# Patient Record
Sex: Female | Born: 1940 | Race: White | Hispanic: No | Marital: Married | Smoking: Current every day smoker
Health system: Southern US, Community
[De-identification: ages and names within clinical notes are randomized; demographics above are authoritative.]

## PROBLEM LIST (undated history)

## (undated) DIAGNOSIS — I219 Acute myocardial infarction, unspecified: Secondary | ICD-10-CM

## (undated) DIAGNOSIS — Z950 Presence of cardiac pacemaker: Secondary | ICD-10-CM

## (undated) DIAGNOSIS — F32A Depression, unspecified: Secondary | ICD-10-CM

## (undated) DIAGNOSIS — F329 Major depressive disorder, single episode, unspecified: Secondary | ICD-10-CM

## (undated) DIAGNOSIS — I10 Essential (primary) hypertension: Secondary | ICD-10-CM

## (undated) DIAGNOSIS — E119 Type 2 diabetes mellitus without complications: Secondary | ICD-10-CM

## (undated) DIAGNOSIS — F419 Anxiety disorder, unspecified: Secondary | ICD-10-CM

## (undated) DIAGNOSIS — N189 Chronic kidney disease, unspecified: Secondary | ICD-10-CM

## (undated) DIAGNOSIS — E079 Disorder of thyroid, unspecified: Secondary | ICD-10-CM

## (undated) DIAGNOSIS — R9431 Abnormal electrocardiogram [ECG] [EKG]: Secondary | ICD-10-CM

## (undated) DIAGNOSIS — I2510 Atherosclerotic heart disease of native coronary artery without angina pectoris: Secondary | ICD-10-CM

## (undated) DIAGNOSIS — I5042 Chronic combined systolic (congestive) and diastolic (congestive) heart failure: Secondary | ICD-10-CM

## (undated) DIAGNOSIS — C969 Malignant neoplasm of lymphoid, hematopoietic and related tissue, unspecified: Secondary | ICD-10-CM

## (undated) HISTORY — PX: CHOLECYSTECTOMY: SHX55

## (undated) HISTORY — PX: PACEMAKER IMPLANT: EP1218

## (undated) HISTORY — DX: Depression, unspecified: F32.A

## (undated) HISTORY — PX: EYE SURGERY: SHX253

## (undated) HISTORY — DX: Major depressive disorder, single episode, unspecified: F32.9

## (undated) HISTORY — DX: Type 2 diabetes mellitus without complications: E11.9

## (undated) HISTORY — PX: OTHER SURGICAL HISTORY: SHX169

## (undated) HISTORY — DX: Anxiety disorder, unspecified: F41.9

## (undated) HISTORY — DX: Disorder of thyroid, unspecified: E07.9

## (undated) HISTORY — DX: Chronic kidney disease, unspecified: N18.9

## (undated) HISTORY — DX: Atherosclerotic heart disease of native coronary artery without angina pectoris: I25.10

---

## 2004-07-05 ENCOUNTER — Ambulatory Visit: Payer: Self-pay | Admitting: Family Medicine

## 2005-08-07 ENCOUNTER — Ambulatory Visit: Payer: Self-pay

## 2006-12-26 ENCOUNTER — Ambulatory Visit: Payer: Self-pay | Admitting: Family Medicine

## 2006-12-31 ENCOUNTER — Ambulatory Visit: Payer: Self-pay | Admitting: Family Medicine

## 2007-01-13 ENCOUNTER — Ambulatory Visit: Payer: Self-pay | Admitting: Family Medicine

## 2008-11-21 ENCOUNTER — Ambulatory Visit: Payer: Self-pay | Admitting: Family Medicine

## 2009-01-18 ENCOUNTER — Ambulatory Visit: Payer: Self-pay | Admitting: Internal Medicine

## 2009-01-23 ENCOUNTER — Ambulatory Visit: Payer: Self-pay | Admitting: Internal Medicine

## 2010-01-18 ENCOUNTER — Ambulatory Visit: Payer: Self-pay | Admitting: Family Medicine

## 2010-02-26 ENCOUNTER — Ambulatory Visit: Payer: Self-pay | Admitting: Gastroenterology

## 2010-02-28 LAB — PATHOLOGY REPORT

## 2010-11-28 ENCOUNTER — Ambulatory Visit: Payer: Self-pay | Admitting: Ophthalmology

## 2011-02-26 ENCOUNTER — Ambulatory Visit: Payer: Self-pay | Admitting: Family Medicine

## 2011-11-05 ENCOUNTER — Ambulatory Visit: Payer: Self-pay | Admitting: Cardiology

## 2012-02-27 ENCOUNTER — Ambulatory Visit: Payer: Self-pay | Admitting: Family Medicine

## 2013-01-28 ENCOUNTER — Other Ambulatory Visit: Payer: Self-pay | Admitting: Family Medicine

## 2013-01-28 LAB — CLOSTRIDIUM DIFFICILE BY PCR

## 2013-01-29 LAB — WBCS, STOOL

## 2013-01-31 LAB — STOOL CULTURE

## 2013-03-23 ENCOUNTER — Ambulatory Visit: Payer: Self-pay | Admitting: Gastroenterology

## 2013-03-25 LAB — PATHOLOGY REPORT

## 2017-02-04 ENCOUNTER — Encounter: Payer: Self-pay | Admitting: *Deleted

## 2017-02-04 ENCOUNTER — Observation Stay
Admission: EM | Admit: 2017-02-04 | Discharge: 2017-02-05 | Disposition: A | Discharge status: Home / Self Care | Payer: Medicare (Managed Care) | Attending: Internal Medicine | Admitting: Internal Medicine

## 2017-02-04 ENCOUNTER — Emergency Department: Payer: Medicare (Managed Care)

## 2017-02-04 DIAGNOSIS — Z95810 Presence of automatic (implantable) cardiac defibrillator: Secondary | ICD-10-CM | POA: Insufficient documentation

## 2017-02-04 DIAGNOSIS — E876 Hypokalemia: Secondary | ICD-10-CM | POA: Insufficient documentation

## 2017-02-04 DIAGNOSIS — E119 Type 2 diabetes mellitus without complications: Secondary | ICD-10-CM | POA: Insufficient documentation

## 2017-02-04 DIAGNOSIS — I252 Old myocardial infarction: Secondary | ICD-10-CM | POA: Insufficient documentation

## 2017-02-04 DIAGNOSIS — Z955 Presence of coronary angioplasty implant and graft: Secondary | ICD-10-CM | POA: Insufficient documentation

## 2017-02-04 DIAGNOSIS — E1122 Type 2 diabetes mellitus with diabetic chronic kidney disease: Secondary | ICD-10-CM

## 2017-02-04 DIAGNOSIS — R55 Syncope and collapse: Secondary | ICD-10-CM | POA: Diagnosis present

## 2017-02-04 DIAGNOSIS — F17200 Nicotine dependence, unspecified, uncomplicated: Secondary | ICD-10-CM | POA: Insufficient documentation

## 2017-02-04 DIAGNOSIS — Z7984 Long term (current) use of oral hypoglycemic drugs: Secondary | ICD-10-CM | POA: Insufficient documentation

## 2017-02-04 DIAGNOSIS — Z7902 Long term (current) use of antithrombotics/antiplatelets: Secondary | ICD-10-CM | POA: Insufficient documentation

## 2017-02-04 DIAGNOSIS — J441 Chronic obstructive pulmonary disease with (acute) exacerbation: Secondary | ICD-10-CM | POA: Insufficient documentation

## 2017-02-04 DIAGNOSIS — I2510 Atherosclerotic heart disease of native coronary artery without angina pectoris: Secondary | ICD-10-CM | POA: Diagnosis present

## 2017-02-04 DIAGNOSIS — N183 Chronic kidney disease, stage 3 unspecified: Secondary | ICD-10-CM

## 2017-02-04 DIAGNOSIS — I4901 Ventricular fibrillation: Principal | ICD-10-CM | POA: Insufficient documentation

## 2017-02-04 DIAGNOSIS — Z79899 Other long term (current) drug therapy: Secondary | ICD-10-CM | POA: Insufficient documentation

## 2017-02-04 DIAGNOSIS — I10 Essential (primary) hypertension: Secondary | ICD-10-CM | POA: Diagnosis present

## 2017-02-04 HISTORY — DX: Acute myocardial infarction, unspecified: I21.9

## 2017-02-04 HISTORY — DX: Essential (primary) hypertension: I10

## 2017-02-04 HISTORY — DX: Type 2 diabetes mellitus without complications: E11.9

## 2017-02-04 HISTORY — DX: Presence of cardiac pacemaker: Z95.0

## 2017-02-04 LAB — COMPREHENSIVE METABOLIC PANEL
ALT: 11 U/L — ABNORMAL LOW (ref 14–54)
AST: 21 U/L (ref 15–41)
Albumin: 3.9 g/dL (ref 3.5–5.0)
Alkaline Phosphatase: 89 U/L (ref 38–126)
Anion gap: 12 (ref 5–15)
BUN: 19 mg/dL (ref 6–20)
CO2: 27 mmol/L (ref 22–32)
Calcium: 9.1 mg/dL (ref 8.9–10.3)
Chloride: 103 mmol/L (ref 101–111)
Creatinine, Ser: 0.83 mg/dL (ref 0.44–1.00)
GFR calc Af Amer: >60 mL/min (ref >60)
GFR calc non Af Amer: >60 mL/min (ref >60)
Glucose, Bld: 157 mg/dL — ABNORMAL HIGH (ref 65–99)
Potassium: 3 mmol/L — ABNORMAL LOW (ref 3.5–5.1)
Sodium: 142 mmol/L (ref 135–145)
Total Bilirubin: 0.4 mg/dL (ref 0.3–1.2)
Total Protein: 7.4 g/dL (ref 6.5–8.1)

## 2017-02-04 LAB — TROPONIN I: Troponin I: <0.03 ng/mL (ref <0.03)

## 2017-02-04 LAB — CBC WITH DIFFERENTIAL/PLATELET
Basophils Absolute: 0 10*3/uL (ref 0–0.1)
Basophils Relative: 0 %
Eosinophils Absolute: 0.1 10*3/uL (ref 0–0.7)
Eosinophils Relative: 2 %
HCT: 31.8 % — ABNORMAL LOW (ref 35.0–47.0)
Hemoglobin: 10.7 g/dL — ABNORMAL LOW (ref 12.0–16.0)
Lymphocytes Relative: 28 %
Lymphs Abs: 2.3 10*3/uL (ref 1.0–3.6)
MCH: 29.1 pg (ref 26.0–34.0)
MCHC: 33.8 g/dL (ref 32.0–36.0)
MCV: 86.1 fL (ref 80.0–100.0)
Monocytes Absolute: 0.7 10*3/uL (ref 0.2–0.9)
Monocytes Relative: 8 %
Neutro Abs: 5 10*3/uL (ref 1.4–6.5)
Neutrophils Relative %: 62 %
Platelets: 234 10*3/uL (ref 150–440)
RBC: 3.69 MIL/uL — ABNORMAL LOW (ref 3.80–5.20)
RDW: 18.1 % — ABNORMAL HIGH (ref 11.5–14.5)
WBC: 8.1 10*3/uL (ref 3.6–11.0)

## 2017-02-04 MED ORDER — INSULIN ASPART 100 UNIT/ML ~~LOC~~ SOLN
0.0000 [IU] | Freq: Three times a day (TID) | SUBCUTANEOUS | Status: DC
  Administered 2017-02-05: 2 [IU] via SUBCUTANEOUS
  Filled 2017-02-04: qty 1

## 2017-02-04 MED ORDER — LEVOTHYROXINE SODIUM 50 MCG PO TABS
50.0000 ug | ORAL_TABLET | Freq: Every day | ORAL | Status: DC
  Administered 2017-02-05: 50 ug via ORAL
  Filled 2017-02-04: qty 1

## 2017-02-04 MED ORDER — MAGNESIUM SULFATE 2 GM/50ML IV SOLN
2.0000 g | Freq: Once | INTRAVENOUS | Status: AC
  Administered 2017-02-04: 2 g via INTRAVENOUS
  Filled 2017-02-04: qty 50

## 2017-02-04 MED ORDER — INSULIN ASPART 100 UNIT/ML ~~LOC~~ SOLN: 0.0000 [IU] | Freq: Every day | SUBCUTANEOUS | Status: DC

## 2017-02-04 MED ORDER — SIMVASTATIN 40 MG PO TABS
40.0000 mg | ORAL_TABLET | Freq: Every evening | ORAL | Status: DC
  Administered 2017-02-05: 40 mg via ORAL
  Filled 2017-02-04: qty 2
  Filled 2017-02-04: qty 4

## 2017-02-04 MED ORDER — LOSARTAN POTASSIUM 50 MG PO TABS
50.0000 mg | ORAL_TABLET | Freq: Every day | ORAL | Status: DC
  Administered 2017-02-05: 50 mg via ORAL
  Filled 2017-02-04: qty 1

## 2017-02-04 MED ORDER — ACETAMINOPHEN 650 MG RE SUPP: 650.0000 mg | Freq: Four times a day (QID) | RECTAL | Status: DC | PRN

## 2017-02-04 MED ORDER — CLOPIDOGREL BISULFATE 75 MG PO TABS
75.0000 mg | ORAL_TABLET | Freq: Every day | ORAL | Status: DC
  Administered 2017-02-05: 75 mg via ORAL
  Filled 2017-02-04: qty 1

## 2017-02-04 MED ORDER — ACETAMINOPHEN 325 MG PO TABS
650.0000 mg | ORAL_TABLET | Freq: Four times a day (QID) | ORAL | Status: DC | PRN
  Administered 2017-02-05: 650 mg via ORAL
  Filled 2017-02-04: qty 2

## 2017-02-04 MED ORDER — ONDANSETRON HCL 4 MG/2ML IJ SOLN: 4.0000 mg | Freq: Four times a day (QID) | INTRAMUSCULAR | Status: DC | PRN

## 2017-02-04 MED ORDER — CARVEDILOL 6.25 MG PO TABS
3.1250 mg | ORAL_TABLET | Freq: Two times a day (BID) | ORAL | Status: DC
  Filled 2017-02-04: qty 1

## 2017-02-04 MED ORDER — ENOXAPARIN SODIUM 40 MG/0.4ML ~~LOC~~ SOLN
40.0000 mg | SUBCUTANEOUS | Status: DC
  Administered 2017-02-05: 40 mg via SUBCUTANEOUS
  Filled 2017-02-04: qty 0.4

## 2017-02-04 MED ORDER — FLUOXETINE HCL 20 MG PO CAPS
20.0000 mg | ORAL_CAPSULE | Freq: Two times a day (BID) | ORAL | Status: DC
  Administered 2017-02-05 (×2): 20 mg via ORAL
  Filled 2017-02-04 (×3): qty 1

## 2017-02-04 MED ORDER — ONDANSETRON HCL 4 MG PO TABS: 4.0000 mg | ORAL_TABLET | Freq: Four times a day (QID) | ORAL | Status: DC | PRN

## 2017-02-04 MED ORDER — POTASSIUM CHLORIDE CRYS ER 20 MEQ PO TBCR
40.0000 meq | EXTENDED_RELEASE_TABLET | Freq: Once | ORAL | Status: AC
  Administered 2017-02-04: 40 meq via ORAL
  Filled 2017-02-04: qty 2

## 2017-02-04 NOTE — ED Triage Notes (Signed)
Per EMS report, Patient had a near-syncopal episode while in a restaurant. Upon being transported in the ambulance, patient c/o chest pain 2/10 and had a possible seizure that lasted "30 seconds" per EMS report and had no post-ictal period. Patient denies any pain upon arrival and is alert and oriented. Patient has a history of 5 MI's.

## 2017-02-04 NOTE — ED Provider Notes (Signed)
[facility] Emergency Department Provider Note    First MD Initiated Contact with Patient 02/04/17 2111     (approximate)  I have reviewed the triage vital signs and the nursing notes.   HISTORY  Chief Complaint Near Syncope    HPI CHALSEA DARKO is a 76 y.o. female history of diabetes, hypertension and MI status post stent as well as a pacemaker defibrillator presents with multiple syncopal episodes in seizure-like episode without a postictal period that occurred this evening while she was at a restaurant. States that she did feel funny just before her syncopal episode. She was sitting down and was non-provoked. States that she was out for only a few seconds and her son helped her to the ground and she responded. She is she has been having some more shortness of breath than usual. States that she had chest pain is 2 out of 10 in severity in the ambulance but does not have any chest pain at this time. Her palpitations or fluttering. She did lose control of her bladder.   Past Medical History:  Diagnosis Date  . Diabetes mellitus without complication ([place])   . Hypertension   . MI (myocardial infarction) ([place])    x 5  . Pacemaker    Family History  Problem Relation Age of Onset  . Hypertension Father   . Heart attack Father    Past Surgical History:  Procedure Laterality Date  . pacemaker/defibrillator Left    Patient Active Problem List   Diagnosis Date Noted  . Syncope and collapse 02/04/2017  . CAD (coronary artery disease) 02/04/2017  . HTN (hypertension) 02/04/2017  . Diabetes ([place]) 02/04/2017      Prior to Admission medications   Not on File    Allergies Levaquin [levofloxacin in d5w] and Sulfa antibiotics    Social History Social History  Substance Use Topics  . Smoking status: Never Smoker  . Smokeless tobacco: Never Used  . Alcohol use Yes     Comment: occasionally    Review of Systems Patient denies headaches,  rhinorrhea, blurry vision, numbness, shortness of breath, chest pain, edema, cough, abdominal pain, nausea, vomiting, diarrhea, dysuria, fevers, rashes or hallucinations unless otherwise stated above in HPI. ____________________________________________   PHYSICAL EXAM:  VITAL SIGNS: Vitals:   02/04/17 2100 02/04/17 2223  BP: 132/70 125/67  Pulse: 73 70  Resp: 20 (!) 22  Temp: 98.3 F (36.8 C)   SpO2: (!) 88% 99%    Constitutional: Alert and oriented. Well appearing and in no acute distress. Eyes: Conjunctivae are normal.  Head: Atraumatic. Nose: No congestion/rhinnorhea. Mouth/Throat: Mucous membranes are moist.   Neck: No stridor. Painless ROM.  Cardiovascular: Normal rate, regular rhythm. Grossly normal heart sounds.  Good peripheral circulation. Respiratory: Normal respiratory effort.  No retractions. Lungs CTAB. Gastrointestinal: Soft and nontender. No distention. No abdominal bruits. No CVA tenderness. Genitourinary:  Musculoskeletal: No lower extremity tenderness nor edema.  No joint effusions. Neurologic:  Normal speech and language. No gross focal neurologic deficits are appreciated. No facial droop Skin:  Skin is warm, dry and intact. No rash noted. Psychiatric: Mood and affect are normal. Speech and behavior are normal.  ____________________________________________   LABS (all labs ordered are listed, but only abnormal results are displayed)  Results for orders placed or performed during the hospital encounter of 02/04/17 (from the past 24 hour(s))  CBC with Differential/Platelet     Status: Abnormal   Collection Time: 02/04/17  9:17 PM  Result Value  Ref Range   WBC 8.1 3.6 - 11.0 K/uL   RBC 3.69 (L) 3.80 - 5.20 MIL/uL   Hemoglobin 10.7 (L) 12.0 - 16.0 g/dL   HCT 31.8 (L) 35.0 - 47.0 %   MCV 86.1 80.0 - 100.0 fL   MCH 29.1 26.0 - 34.0 pg   MCHC 33.8 32.0 - 36.0 g/dL   RDW 18.1 (H) 11.5 - 14.5 %   Platelets 234 150 - 440 K/uL   Neutrophils Relative % 62 %    Neutro Abs 5.0 1.4 - 6.5 K/uL   Lymphocytes Relative 28 %   Lymphs Abs 2.3 1.0 - 3.6 K/uL   Monocytes Relative 8 %   Monocytes Absolute 0.7 0.2 - 0.9 K/uL   Eosinophils Relative 2 %   Eosinophils Absolute 0.1 0 - 0.7 K/uL   Basophils Relative 0 %   Basophils Absolute 0.0 0 - 0.1 K/uL  Comprehensive metabolic panel     Status: Abnormal   Collection Time: 02/04/17  9:17 PM  Result Value Ref Range   Sodium 142 135 - 145 mmol/L   Potassium 3.0 (L) 3.5 - 5.1 mmol/L   Chloride 103 101 - 111 mmol/L   CO2 27 22 - 32 mmol/L   Glucose, Bld 157 (H) 65 - 99 mg/dL   BUN 19 6 - 20 mg/dL   Creatinine, Ser 0.83 0.44 - 1.00 mg/dL   Calcium 9.1 8.9 - 10.3 mg/dL   Total Protein 7.4 6.5 - 8.1 g/dL   Albumin 3.9 3.5 - 5.0 g/dL   AST 21 15 - 41 U/L   ALT 11 (L) 14 - 54 U/L   Alkaline Phosphatase 89 38 - 126 U/L   Total Bilirubin 0.4 0.3 - 1.2 mg/dL   GFR calc non Af Amer >60 >60 mL/min   GFR calc Af Amer >60 >60 mL/min   Anion gap 12 5 - 15  Troponin I     Status: None   Collection Time: 02/04/17  9:17 PM  Result Value Ref Range   Troponin I <0.03 <0.03 ng/mL   ____________________________________________  EKG My review and personal interpretation at Time: 21:02   Indication: syncope  Rate: 75  Rhythm: sinus Axis: right Other: non specific st changes, prolonged qt,  ____________________________________________  RADIOLOGY  I personally reviewed all radiographic images ordered to evaluate for the above acute complaints and reviewed radiology reports and findings.  These findings were personally discussed with the patient.  Please see medical record for radiology report.  ____________________________________________   PROCEDURES  Procedure(s) performed:  Procedures    Critical Care performed: no ____________________________________________   INITIAL IMPRESSION / ASSESSMENT AND PLAN / ED COURSE  Pertinent labs & imaging results that were available during my care of the patient  were reviewed by me and considered in my medical decision making (see chart for details).  DDX: dysrhythmia, acs, seizure, hypoglycemia, electrolyte abn  AHAANA ROCHETTE is a 76 y.o. who presents to the ED with sacral episodes as well as shortness of breath and chest pain as described above. Patient has extensive heart history but does have a defibrillator in place. Dysrhythmia or ACS is a possibility. No evidence of acute ischemia on EKG. Troponin is negative. Blood work does show evidence of hypokalemia. We will interrogate pacer. CT imaging ordered due to concern for bleed given seizure epilepsy in the differential. 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 Certainly not consistent with status epilepticus in the history seems  a bit less consistent with seizure. Based on her extensive heart history complaint of shortness of breath and chest discomfort with multiple syncopal episodes that he believe the patient would benefit from monitoring in the hospital for further evaluation and management.      ____________________________________________   FINAL CLINICAL IMPRESSION(S) / ED DIAGNOSES  Final diagnoses:  Syncope and collapse  Hypokalemia      NEW MEDICATIONS STARTED DURING THIS VISIT:  New Prescriptions   No medications on file     Note:  This document was prepared using Dragon voice recognition software and may include unintentional dictation errors.    Merlyn Lot, MD 02/04/17 2227

## 2017-02-04 NOTE — H&P (Signed)
[facility] [place] NAME: Heather Peterson    MR#:  [id number]  DATE OF BIRTH:  Feb 25, 1941  DATE OF ADMISSION:  02/04/2017  PRIMARY CARE PHYSICIAN: System, Pcp Not In   REQUESTING/REFERRING PHYSICIAN: Quentin Cornwall, MD  CHIEF COMPLAINT:   Chief Complaint  Patient presents with  . Near Syncope    HISTORY OF PRESENT ILLNESS:  Heather Peterson  is a 76 y.o. female who presents with Syncopal episodes. Patient has significant cardiac history, has a defibrillator/pacemaker in place. She states that she has had prior discharges from her defibrillator, and that tonight's episodes were similar to this. Hospitalists were called for admission and further evaluation.  PAST MEDICAL HISTORY:   Past Medical History:  Diagnosis Date  . Diabetes mellitus without complication ([place])   . Hypertension   . MI (myocardial infarction) ([place])    x 5  . Pacemaker     PAST SURGICAL HISTORY:   Past Surgical History:  Procedure Laterality Date  . pacemaker/defibrillator Left     SOCIAL HISTORY:   Social History  Substance Use Topics  . Smoking status: Never Smoker  . Smokeless tobacco: Never Used  . Alcohol use Yes     Comment: occasionally    FAMILY HISTORY:   Family History  Problem Relation Age of Onset  . Hypertension Father   . Heart attack Father     DRUG ALLERGIES:   Allergies  Allergen Reactions  . Celebrex [Celecoxib] Anaphylaxis  . Levaquin [Levofloxacin In D5w] Other (See Comments)    Heart arrhthymias  . Sulfa Antibiotics Other (See Comments)    Reaction: unknown  . Penicillins Rash    Has patient had a PCN reaction causing immediate rash, facial/tongue/throat swelling, SOB or lightheadedness with hypotension: Unknown Has patient had a PCN reaction causing severe rash involving mucus membranes or skin necrosis: No Has patient had a PCN reaction that required hospitalization: No Has patient had a PCN reaction occurring  within the last 10 years: No If all of the above answers are "NO", then may proceed with Cephalosporin use.     MEDICATIONS AT HOME:   Prior to Admission medications   Medication Sig Start Date End Date Taking? Authorizing Provider  carvedilol (COREG) 3.125 MG tablet Take 3.125 mg by mouth 2 (two) times daily. 12/30/16  Yes [provider]  clopidogrel (PLAVIX) 75 MG tablet Take 75 mg by mouth daily. 12/30/16  Yes [provider]  FLUoxetine (PROZAC) 20 MG capsule Take 20 mg by mouth 2 (two) times daily. 10/28/16  Yes [provider]  levothyroxine (SYNTHROID, LEVOTHROID) 50 MCG tablet Take 50 mcg by mouth daily. 12/13/16  Yes [provider]  losartan (COZAAR) 50 MG tablet Take 50 mg by mouth daily.   Yes [provider]  metFORMIN (GLUCOPHAGE-XR) 750 MG 24 hr tablet Take 750 mg by mouth daily. 11/19/16  Yes [provider]  Multiple Vitamin (MULTIVITAMIN WITH MINERALS) TABS tablet Take 1 tablet by mouth daily.   Yes [provider]  niacin 500 MG tablet Take 1,500 mg by mouth 2 (two) times daily with a meal.   Yes [provider]  omega-3 acid ethyl esters (LOVAZA) 1 g capsule Take 2 g by mouth daily.   Yes [provider]  pioglitazone (ACTOS) 15 MG tablet Take 15 mg by mouth daily. 11/19/16  Yes [provider]  simvastatin (ZOCOR) 40 MG tablet Take 40 mg by mouth every evening. 11/18/16  Yes  [provider]    REVIEW OF SYSTEMS:  Review of Systems  Constitutional: Negative for chills, fever, malaise/fatigue and weight loss.  HENT: Negative for ear pain, hearing loss and tinnitus.   Eyes: Negative for blurred vision, double vision, pain and redness.  Respiratory: Negative for cough, hemoptysis and shortness of breath.   Cardiovascular: Negative for chest pain, palpitations, orthopnea and leg swelling.  Gastrointestinal: Negative for abdominal pain, constipation, diarrhea, nausea and vomiting.   Genitourinary: Negative for dysuria, frequency and hematuria.  Musculoskeletal: Negative for back pain, joint pain and neck pain.  Skin:       No acne, rash, or lesions  Neurological: Positive for loss of consciousness. Negative for dizziness, tremors, focal weakness and weakness.  Endo/Heme/Allergies: Negative for polydipsia. Does not bruise/bleed easily.  Psychiatric/Behavioral: Negative for depression. The patient is not nervous/anxious and does not have insomnia.      VITAL SIGNS:   Vitals:   02/04/17 2100 02/04/17 2105 02/04/17 2223  BP: 132/70  125/67  Pulse: 73  70  Resp: 20  (!) 22  Temp: 98.3 F (36.8 C)    TempSrc: Oral    SpO2: (!) 88%  99%  Weight:  79.4 kg (175 lb)   Height:  5' 4"  (1.626 m)    Wt Readings from Last 3 Encounters:  02/04/17 79.4 kg (175 lb)    PHYSICAL EXAMINATION:  Physical Exam  Vitals reviewed. Constitutional: She is oriented to person, place, and time. She appears well-developed and well-nourished. No distress.  HENT:  Head: Normocephalic and atraumatic.  Mouth/Throat: Oropharynx is clear and moist.  Eyes: Pupils are equal, round, and reactive to light. Conjunctivae and EOM are normal. No scleral icterus.  Neck: Normal range of motion. Neck supple. No JVD present. No thyromegaly present.  Cardiovascular: Normal rate, regular rhythm and intact distal pulses.  Exam reveals no gallop and no friction rub.   No murmur heard. Respiratory: Effort normal and breath sounds normal. No respiratory distress. She has no wheezes. She has no rales.  GI: Soft. Bowel sounds are normal. She exhibits no distension. There is no tenderness.  Musculoskeletal: Normal range of motion. She exhibits no edema.  No arthritis, no gout  Lymphadenopathy:    She has no cervical adenopathy.  Neurological: She is alert and oriented to person, place, and time. No cranial nerve deficit.  No dysarthria, no aphasia  Skin: Skin is warm and dry. No rash noted. No erythema.   Psychiatric: She has a normal mood and affect. Her behavior is normal. Judgment and thought content normal.    LABORATORY PANEL:   CBC  Recent Labs Lab 02/04/17 2117  WBC 8.1  HGB 10.7*  HCT 31.8*  PLT 234   ------------------------------------------------------------------------------------------------------------------  Chemistries   Recent Labs Lab 02/04/17 2117  NA 142  K 3.0*  CL 103  CO2 27  GLUCOSE 157*  BUN 19  CREATININE 0.83  CALCIUM 9.1  AST 21  ALT 11*  ALKPHOS 89  BILITOT 0.4   ------------------------------------------------------------------------------------------------------------------  Cardiac Enzymes  Recent Labs Lab 02/04/17 2117  TROPONINI <0.03   ------------------------------------------------------------------------------------------------------------------  RADIOLOGY:  Dg Chest 2 View  Result Date: 02/04/2017 CLINICAL DATA:  Syncope EXAM: CHEST  2 VIEW COMPARISON:  None. FINDINGS: Left-sided duo lead pacing device. Mild cardiomegaly with aortic atherosclerosis. No consolidation or effusion. No pneumothorax. Degenerative changes of the spine. IMPRESSION: Mild to moderate cardiomegaly.  Negative for edema or infiltrate. Electronically Signed   By: Donavan Foil M.D.   On:  02/04/2017 21:49   Ct Head Wo Contrast  Result Date: 02/04/2017 CLINICAL DATA:  Seizure, syncope EXAM: CT HEAD WITHOUT CONTRAST TECHNIQUE: Contiguous axial images were obtained from the base of the skull through the vertex without intravenous contrast. COMPARISON:  None. FINDINGS: Brain: No acute territorial infarction, hemorrhage or intracranial mass is seen. 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 Mild ex vacuo dilatation of the right lateral ventricle. Mild to moderate small vessel ischemic changes of the white matter. Mild to moderate atrophy. Vascular: No hyperdense vessels. Left vertebral and bilateral carotid artery calcifications. Skull: No fracture  or suspicious bone lesion Sinuses/Orbits: No acute finding. Other: None IMPRESSION: 1. No CT evidence for acute intracranial abnormality. 2. Old right frontal lobe infarct. Atrophy and small vessel ischemic changes of the white matter Electronically Signed   By: Donavan Foil M.D.   On: 02/04/2017 21:53    EKG:  No orders found for this or any previous visit.  IMPRESSION AND PLAN:  Principal Problem:   Syncope and collapse - likely cardiac in etiology, we'll interrogate her pacemaker to see if there is been any discharge from her defibrillator, or arrhythmia detected. There was some question of possible seizure-like activity by EMS, though she did not have any significant postictal state and these episodes were very similar to prior syncopal episodes due to arrhythmias. Cardiology consult Active Problems:   CAD (coronary artery disease) - continue home meds, other workup as above   HTN (hypertension) - continue home meds   Diabetes ([place]) - sliding scale insulin with corresponding glucose checks  All the records are reviewed and case discussed with ED provider. Management plans discussed with the patient and/or family.  DVT PROPHYLAXIS: SubQ lovenox  GI PROPHYLAXIS: None  ADMISSION STATUS: Observation  CODE STATUS: Full Code Status History    This patient does not have a recorded code status. Please follow your organizational policy for patients in this situation.    Advance Directive Documentation     Most Recent Value  Type of Advance Directive  Healthcare Power of Attorney  Pre-existing out of facility DNR order (yellow form or pink MOST form)  -  "MOST" Form in Place?  -      TOTAL TIME TAKING CARE OF THIS PATIENT: 40 minutes.   Heather Peterson [place] 02/04/2017, 11:05 PM  [facility]  Office  [phone]  CC: Primary care physician; System, Pcp Not In  Note:  This document was prepared using Dragon voice recognition software and may include  unintentional dictation errors.

## 2017-02-04 NOTE — ED Notes (Signed)
St Jude transmitter placed on patient.

## 2017-02-04 NOTE — ED Notes (Signed)
Patient taken to imaging. 

## 2017-02-05 ENCOUNTER — Observation Stay
Admit: 2017-02-05 | Discharge: 2017-02-05 | Disposition: A | Discharge status: Home / Self Care | Payer: Medicare (Managed Care) | Attending: Internal Medicine | Admitting: Internal Medicine

## 2017-02-05 ENCOUNTER — Encounter: Payer: Self-pay | Admitting: *Deleted

## 2017-02-05 LAB — CBC
HCT: 30 % — ABNORMAL LOW (ref 35.0–47.0)
Hemoglobin: 10 g/dL — ABNORMAL LOW (ref 12.0–16.0)
MCH: 28.8 pg (ref 26.0–34.0)
MCHC: 33.5 g/dL (ref 32.0–36.0)
MCV: 86.2 fL (ref 80.0–100.0)
Platelets: 211 10*3/uL (ref 150–440)
RBC: 3.48 MIL/uL — ABNORMAL LOW (ref 3.80–5.20)
RDW: 18.4 % — ABNORMAL HIGH (ref 11.5–14.5)
WBC: 8.3 10*3/uL (ref 3.6–11.0)

## 2017-02-05 LAB — GLUCOSE, CAPILLARY
Glucose-Capillary: 101 mg/dL — ABNORMAL HIGH (ref 65–99)
Glucose-Capillary: 101 mg/dL — ABNORMAL HIGH (ref 65–99)
Glucose-Capillary: 122 mg/dL — ABNORMAL HIGH (ref 65–99)
Glucose-Capillary: 155 mg/dL — ABNORMAL HIGH (ref 65–99)

## 2017-02-05 LAB — BASIC METABOLIC PANEL
Anion gap: 10 (ref 5–15)
BUN: 20 mg/dL (ref 6–20)
CO2: 26 mmol/L (ref 22–32)
Calcium: 8.8 mg/dL — ABNORMAL LOW (ref 8.9–10.3)
Chloride: 106 mmol/L (ref 101–111)
Creatinine, Ser: 0.75 mg/dL (ref 0.44–1.00)
GFR calc Af Amer: >60 mL/min (ref >60)
GFR calc non Af Amer: >60 mL/min (ref >60)
Glucose, Bld: 99 mg/dL (ref 65–99)
Potassium: 3.9 mmol/L (ref 3.5–5.1)
Sodium: 142 mmol/L (ref 135–145)

## 2017-02-05 LAB — ECHOCARDIOGRAM COMPLETE
Height: 64 in
Weight: 2841.6 oz

## 2017-02-05 MED ORDER — ALBUTEROL SULFATE (2.5 MG/3ML) 0.083% IN NEBU
2.5000 mg | INHALATION_SOLUTION | RESPIRATORY_TRACT | Status: DC | PRN
  Filled 2017-02-05: qty 3

## 2017-02-05 MED ORDER — FLUTICASONE-SALMETEROL 250-50 MCG/DOSE IN AEPB: 1.0000 | INHALATION_SPRAY | Freq: Two times a day (BID) | RESPIRATORY_TRACT | 1 refills | Status: DC

## 2017-02-05 MED ORDER — IPRATROPIUM-ALBUTEROL 0.5-2.5 (3) MG/3ML IN SOLN
3.0000 mL | Freq: Four times a day (QID) | RESPIRATORY_TRACT | Status: DC
  Administered 2017-02-05 (×2): 3 mL via RESPIRATORY_TRACT
  Filled 2017-02-05: qty 3

## 2017-02-05 MED ORDER — ALBUTEROL SULFATE HFA 108 (90 BASE) MCG/ACT IN AERS: 2.0000 | INHALATION_SPRAY | RESPIRATORY_TRACT | 1 refills | Status: DC | PRN

## 2017-02-05 MED ORDER — PREDNISONE 10 MG (21) PO TBPK: 10.0000 mg | ORAL_TABLET | Freq: Every day | ORAL | 0 refills | Status: DC

## 2017-02-05 MED ORDER — NICOTINE 14 MG/24HR TD PT24: 14.0000 mg | MEDICATED_PATCH | Freq: Every day | TRANSDERMAL | 0 refills | Status: DC

## 2017-02-05 MED ORDER — AMIODARONE HCL 200 MG PO TABS: 200.0000 mg | ORAL_TABLET | Freq: Two times a day (BID) | ORAL | 0 refills | Status: DC

## 2017-02-05 MED ORDER — NICOTINE 14 MG/24HR TD PT24
14.0000 mg | MEDICATED_PATCH | Freq: Every day | TRANSDERMAL | Status: DC
  Administered 2017-02-05: 14 mg via TRANSDERMAL
  Filled 2017-02-05: qty 1

## 2017-02-05 MED ORDER — AMIODARONE HCL 200 MG PO TABS
200.0000 mg | ORAL_TABLET | Freq: Two times a day (BID) | ORAL | Status: DC
  Administered 2017-02-05: 200 mg via ORAL
  Filled 2017-02-05: qty 1

## 2017-02-05 NOTE — Plan of Care (Signed)
Problem: Education: Goal: Knowledge of [facility] Education information/materials will improve Outcome: Progressing Pt admitted from ED for syncopal episode. Replaced potassium and magnesium. On acute O2. A-paced on tele.

## 2017-02-05 NOTE — Progress Notes (Signed)
*  PRELIMINARY RESULTS* Echocardiogram 2D Echocardiogram has been performed.  Heather Peterson 02/05/2017, 12:42 PM

## 2017-02-05 NOTE — Care Management Obs Status (Signed)
[place] [place] [facility] NOTIFICATION   Patient Details  Name: KALISTA LAGUARDIA MRN: [id number] Date of Birth: Aug 03, 1940   Medicare Observation Status Notification Given:  Yes  Notice signed, one given to patient and the other to HIM for scanning   Katrina Stack, RN 02/05/2017, 1:52 PM

## 2017-02-05 NOTE — Progress Notes (Signed)
SATURATION QUALIFICATIONS: (This note is used to comply with regulatory documentation for home oxygen)  Patient Saturations on Room Air at Rest = 85%  Patient Saturations on Room Air while Ambulating = %  Patient Saturations on 2 Liters of oxygen while Ambulating = 91%  Please briefly explain why patient needs home oxygen:

## 2017-02-05 NOTE — Care Management (Signed)
[employer] arrived with portable 02. Had another set of forms sent from [facility] care for medical necessity that Dr Tressia Miners will complete

## 2017-02-05 NOTE — Care Management (Signed)
[facility] sent back all of referral information requesting signautre of physician on each page as they do not accept electronic signatures

## 2017-02-05 NOTE — Care Management (Signed)
Patient placed in observation for syncope. Patient has qualified for continuous oxygen.  She currently has nocturnal oxygen. Has recently moved back to [place] from [place].  Says that the company that oxygen was transferred to was [employer] [place]- then stated it was [employer].  Neither of these companies state they are serving the patient. Found that agency is [facility] [phone]. It seems that agency was unaware that patient had moved from [place].   Spoke with agency in [place] and informed that the [place] branch serves [place] area.  Questioned this as [place] much further away but again informed [place] branch serves this area.  Spoke with [facility] branch and informed the [place] branch serves this area. After much back and forth [place] branch accepted referral.  It was faxed and then CM received a call that MD will need to sign a medical necessity and all faxed documentation must be manually signed.  Agency does not allow electronic signatures.  Patient and primary nurse  informed it take approximately 2 hours .  Patient verbalizes concern regarding the cost of her inhalers. Found the copay on her Advair will be 161 dollars and Albuterol will 65 dollars and Advair will be 161 dollars - which patient say is better than the prices in [place].  Discussed researching part D plans during open enrollment.

## 2017-02-05 NOTE — Discharge Summary (Addendum)
[facility] [place] NAME: Heather Peterson    MR#:  [id number]  DATE OF BIRTH:  1941/02/07  DATE OF ADMISSION:  02/04/2017 ADMITTING PHYSICIAN: Lance Coon, MD  DATE OF DISCHARGE: 02/05/17  PRIMARY CARE PHYSICIAN: System, Pcp Not In    ADMISSION DIAGNOSIS:  Syncope and collapse [R55] Hypokalemia [E87.6]  DISCHARGE DIAGNOSIS:  Principal Problem:   Syncope and collapse Active Problems:   CAD (coronary artery disease)   HTN (hypertension)   Diabetes (HCC) copd mild exacerbation  SECONDARY DIAGNOSIS:   Past Medical History:  Diagnosis Date  . Diabetes mellitus without complication ([place])   . Hypertension   . MI (myocardial infarction) ([place])    x 5  . Pacemaker     HOSPITAL COURSE:   HPI  Heather Peterson  is a 76 y.o. female who presents with Syncopal episodes. Patient has significant cardiac history, has a defibrillator/pacemaker in place. She states that she has had prior discharges from her defibrillator, and that tonight's episodes were similar to this. Hospitalists were called for admission and further   Syncope and collapse - 2/2 V FIB Pacer defibrillator interrogated which has revealed V. Fib which is the etiology for patient's syncopal episode- Patient is started on amiodarone 200 mg twice a day for one week followed by 200 mg once daily Echocardiogram is done and results are pending Okay to discharge patient from cardiology standpoint  outpatient follow-up with cardiology in 5-7 days     CAD (coronary artery disease) - continue home meds Plavix, Cozaar and Coreg    HTN (hypertension) - continue home meds    Diabetes ([place]) - continue home medication metformin , patient has received sliding scale insulin with corresponding glucose checks during the hospital course  COPD exacerbation- breathing treatments as needed given during the hospital course Prednisone steroid taper take to go home Albuterol as needed  and Advair twice a day  Patient was previously on 2 L of oxygen during nighttime but now requiring 2 L of oxygen continuously prn given during daytime  Tobacco abuse disorder Counseled patient to quit smoking for 4-5 minutes. She verbalized understanding. Started patient on nicotine patch  DISCHARGE CONDITIONS:   STABLE  CONSULTS OBTAINED:  Treatment Team:  Yolonda Kida, MD   PROCEDURES Pacemaker interrogated  DRUG ALLERGIES:   Allergies  Allergen Reactions  . Celebrex [Celecoxib] Anaphylaxis  . Levaquin [Levofloxacin In D5w] Other (See Comments)    Heart arrhthymias  . Sulfa Antibiotics Other (See Comments)    Reaction: unknown  . Penicillins Rash    Has patient had a PCN reaction causing immediate rash, facial/tongue/throat swelling, SOB or lightheadedness with hypotension: Unknown Has patient had a PCN reaction causing severe rash involving mucus membranes or skin necrosis: No Has patient had a PCN reaction that required hospitalization: No Has patient had a PCN reaction occurring within the last 10 years: No If all of the above answers are "NO", then may proceed with Cephalosporin use.     DISCHARGE MEDICATIONS:   Current Discharge Medication List    START taking these medications   Details  albuterol (PROVENTIL HFA;VENTOLIN HFA) 108 (90 Base) MCG/ACT inhaler Inhale 2 puffs into the lungs every 4 (four) hours as needed for wheezing or shortness of breath. Qty: 1 Inhaler, Refills: 1    amiodarone (PACERONE) 200 MG tablet Take 1 tablet (200 mg total) by mouth 2 (two) times daily. Amiodarone 200 mg twice a day for one week followed  by 200 mg once daily Qty: 40 tablet, Refills: 0    Fluticasone-Salmeterol (ADVAIR DISKUS) 250-50 MCG/DOSE AEPB Inhale 1 puff into the lungs 2 (two) times daily. Qty: 1 each, Refills: 1    nicotine (NICODERM CQ - DOSED IN MG/24 HOURS) 14 mg/24hr patch Place 1 patch (14 mg total) onto the skin daily. Qty: 28 patch, Refills: 0     predniSONE (STERAPRED UNI-PAK 21 TAB) 10 MG (21) TBPK tablet Take 1 tablet (10 mg total) by mouth daily. Take 6 tablets by mouth for 1 day followed by  5 tablets by mouth for 1 day followed by  4 tablets by mouth for 1 day followed by  3 tablets by mouth for 1 day followed by  2 tablets by mouth for 1 day followed by  1 tablet by mouth for a day and stop Qty: 21 tablet, Refills: 0      CONTINUE these medications which have NOT CHANGED   Details  carvedilol (COREG) 3.125 MG tablet Take 3.125 mg by mouth 2 (two) times daily. Refills: 6    clopidogrel (PLAVIX) 75 MG tablet Take 75 mg by mouth daily. Refills: 2    FLUoxetine (PROZAC) 20 MG capsule Take 20 mg by mouth 2 (two) times daily. Refills: 0    levothyroxine (SYNTHROID, LEVOTHROID) 50 MCG tablet Take 50 mcg by mouth daily. Refills: 3    losartan (COZAAR) 50 MG tablet Take 50 mg by mouth daily.    metFORMIN (GLUCOPHAGE-XR) 750 MG 24 hr tablet Take 750 mg by mouth daily. Refills: 2    Multiple Vitamin (MULTIVITAMIN WITH MINERALS) TABS tablet Take 1 tablet by mouth daily.    niacin 500 MG tablet Take 1,500 mg by mouth 2 (two) times daily with a meal.    omega-3 acid ethyl esters (LOVAZA) 1 g capsule Take 2 g by mouth daily.    pioglitazone (ACTOS) 15 MG tablet Take 15 mg by mouth daily. Refills: 3    simvastatin (ZOCOR) 40 MG tablet Take 40 mg by mouth every evening. Refills: 2         DISCHARGE INSTRUCTIONS:   Follow-up with primary care physician in one week Follow-up with [facility] cardiology in 5 days Continue home oxygen 2 L via nasal cannula Quit smoking   DIET:  Cardiac diet and Diabetic diet  DISCHARGE CONDITION:  Stable  ACTIVITY:  Activity as tolerated  OXYGEN:  Home Oxygen: Yes.     Oxygen Delivery: 2 liters/min via Patient connected to nasal cannula oxygen  DISCHARGE LOCATION:  home   If you experience worsening of your admission symptoms, develop shortness of breath, life threatening  emergency, suicidal or homicidal thoughts you must seek medical attention immediately by calling 911 or calling your MD immediately  if symptoms less severe.  You Must read complete instructions/literature along with all the possible adverse reactions/side effects for all the Medicines you take and that have been prescribed to you. Take any new Medicines after you have completely understood and accpet all the possible adverse reactions/side effects.   Please note  You were cared for by a hospitalist during your hospital stay. If you have any questions about your discharge medications or the care you received while you were in the hospital after you are discharged, you can call the unit and asked to speak with the hospitalist on call if the hospitalist that took care of you is not available. Once you are discharged, your primary care physician will handle any further medical issues. Please  note that NO REFILLS for any discharge medications will be authorized once you are discharged, as it is imperative that you return to your primary care physician (or establish a relationship with a primary care physician if you do not have one) for your aftercare needs so that they can reassess your need for medications and monitor your lab values.     Today  Chief Complaint  Patient presents with  . Near Syncope   Patient denies any chest pain or shortness of breath chronically lives on 2 L of oxygen via nasal cannula. Wants to go home. Okay to discharge patient from cardiology standpoint,Patient is started on amiodarone   ROS:  CONSTITUTIONAL: Denies fevers, chills. Denies any fatigue, weakness.  EYES: Denies blurry vision, double vision, eye pain. EARS, NOSE, THROAT: Denies tinnitus, ear pain, hearing loss. RESPIRATORY: Denies cough, wheeze, shortness of breath.  CARDIOVASCULAR: Denies chest pain, palpitations, edema.  GASTROINTESTINAL: Denies nausea, vomiting, diarrhea, abdominal pain. Denies bright red  blood per rectum. GENITOURINARY: Denies dysuria, hematuria. ENDOCRINE: Denies nocturia or thyroid problems. HEMATOLOGIC AND LYMPHATIC: Denies easy bruising or bleeding. SKIN: Denies rash or lesion. MUSCULOSKELETAL: Denies pain in neck, back, shoulder, knees, hips or arthritic symptoms.  NEUROLOGIC: Denies paralysis, paresthesias.  PSYCHIATRIC: Denies anxiety or depressive symptoms.   VITAL SIGNS:  Blood pressure 127/70, pulse 70, temperature 97.6 F (36.4 C), temperature source Oral, resp. rate 18, height 5' 4"  (1.626 m), weight 80.6 kg (177 lb 9.6 oz), SpO2 94 %.  I/O:    Intake/Output Summary (Last 24 hours) at 02/05/17 1458 Last data filed at 02/05/17 1354  Gross per 24 hour  Intake              530 ml  Output              500 ml  Net               30 ml    PHYSICAL EXAMINATION:  GENERAL:  76 y.o.-year-old patient lying in the bed with no acute distress.  EYES: Pupils equal, round, reactive to light and accommodation. No scleral icterus. Extraocular muscles intact.  HEENT: Head atraumatic, normocephalic. Oropharynx and nasopharynx clear.  NECK:  Supple, no jugular venous distention. No thyroid enlargement, no tenderness.  LUNGS: Normal breath sounds bilaterally, no wheezing, rales,rhonchi or crepitation. No use of accessory muscles of respiration.  CARDIOVASCULAR: S1, S2 normal. No murmurs, rubs, or gallops.  ABDOMEN: Soft, non-tender, non-distended. Bowel sounds present. No organomegaly or mass.  EXTREMITIES: No pedal edema, cyanosis, or clubbing.  NEUROLOGIC: Cranial nerves II through XII are intact. Muscle strength 5/5 in all extremities. Sensation intact. Gait not checked.  PSYCHIATRIC: The patient is alert and oriented x 3.  SKIN: No obvious rash, lesion, or ulcer.   DATA REVIEW:   CBC  Recent Labs Lab 02/05/17 0536  WBC 8.3  HGB 10.0*  HCT 30.0*  PLT 211    Chemistries   Recent Labs Lab 02/04/17 2117 02/05/17 0536  NA 142 142  K 3.0* 3.9  CL 103 106   CO2 27 26  GLUCOSE 157* 99  BUN 19 20  CREATININE 0.83 0.75  CALCIUM 9.1 8.8*  AST 21  --   ALT 11*  --   ALKPHOS 89  --   BILITOT 0.4  --     Cardiac Enzymes  Recent Labs Lab 02/04/17 2117  TROPONINI <0.03    Microbiology Results  Results for orders placed or performed in visit on 01/28/13  Clostridium  Difficile by PCR     Status: None   Collection Time: 01/28/13  1:00 PM  Result Value Ref Range Status   Micro Text Report   Final       COMMENT                   NEGATIVE-CLOS.DIFFICILE TOXIN NOT DETECTED BY PCR   ANTIBIOTIC                                                      Stool culture     Status: None   Collection Time: 01/28/13  1:00 PM  Result Value Ref Range Status   Micro Text Report   Final       COMMENT                   NO SALMONELLA OR SHIGELLA ISOLATED IN 3 DAYS   COMMENT                   NO PATHOGENIC E.COLI DETECTED   COMMENT                   NO CAMPYLOBACTER ANTIGEN DETECTED   ANTIBIOTIC                                                        RADIOLOGY:  Dg Chest 2 View  Result Date: 02/04/2017 CLINICAL DATA:  Syncope EXAM: CHEST  2 VIEW COMPARISON:  None. FINDINGS: Left-sided duo lead pacing device. Mild cardiomegaly with aortic atherosclerosis. No consolidation or effusion. No pneumothorax. Degenerative changes of the spine. IMPRESSION: Mild to moderate cardiomegaly.  Negative for edema or infiltrate. Electronically Signed   By: Donavan Foil M.D.   On: 02/04/2017 21:49   Ct Head Wo Contrast  Result Date: 02/04/2017 CLINICAL DATA:  Seizure, syncope EXAM: CT HEAD WITHOUT CONTRAST TECHNIQUE: Contiguous axial images were obtained from the base of the skull through the vertex without intravenous contrast. COMPARISON:  None. FINDINGS: Brain: No acute territorial infarction, hemorrhage or intracranial mass is seen. 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 Mild ex vacuo dilatation of the right lateral ventricle. Mild to moderate small  vessel ischemic changes of the white matter. Mild to moderate atrophy. Vascular: No hyperdense vessels. Left vertebral and bilateral carotid artery calcifications. Skull: No fracture or suspicious bone lesion Sinuses/Orbits: No acute finding. Other: None IMPRESSION: 1. No CT evidence for acute intracranial abnormality. 2. Old right frontal lobe infarct. Atrophy and small vessel ischemic changes of the white matter Electronically Signed   By: Donavan Foil M.D.   On: 02/04/2017 21:53    EKG:  No orders found for this or any previous visit.    Management plans discussed with the patient, family and they are in agreement.  CODE STATUS:     Code Status Orders        Start     Ordered   02/04/17 2340  Full code  Continuous     02/04/17 2340    Code Status History    Date Active Date Inactive Code Status Order ID Comments User Context   This patient has a  current code status but no historical code status.    Advance Directive Documentation     Most Recent Value  Type of Advance Directive  Living will  Pre-existing out of facility DNR order (yellow form or pink MOST form)  -  "MOST" Form in Place?  -      TOTAL TIME TAKING CARE OF THIS PATIENT: 38mnutes.   Note: This dictation was prepared with Dragon dictation along with smaller phrase technology. Any transcriptional errors that result from this process are unintentional.   @MEC @  on 02/05/2017 at 2:58 PM  Between 7am to 6pm - Pager - [phone] After 6pm go to [email or web address] - password [email or web address]  Office  [phone] CC: Primary care physician; System, Pcp Not In

## 2017-02-05 NOTE — Care Management (Signed)
SATURATION QUALIFICATIONS: (This note is used to comply with regulatory documentation for home oxygen)  Patient Saturations on Room Air at Rest = 85%   

## 2017-02-05 NOTE — Discharge Instructions (Signed)
Follow-up with primary care physician in one week Follow-up with [facility] cardiology in 5 days Continue home oxygen 2 L via nasal cannula Quit smoking

## 2017-02-06 NOTE — Consult Note (Signed)
Reason for Consult:  syncope AICD discharge Referring Physician: Dr. Lance Coon hospitalist Cardiologist  Dr. Marin Olp Heather Peterson is an 76 y.o. female.  HPI: Patient history of myocardial infarction Pass status post AICD permanent pacemaker placement in the past. Patient recently moved from [place] back to [place]. History of smoking diabetes hypertension. Patient gives a history of episode of while at lunch having a syncopal episode. The episode was witnessed by family she denied any chest pain doesn't remember the incident. Patient was then brought to the emergency room for further evaluation. Comparison interrogation of the AICD suggested 2 episodes of ventricular fibrillation with overdrive atrial pacing was unsuccessful but subsequently ventricular discharge by the AICD terminating 1 of the episodes DL and terminated spontaneously. Patient gives a history of previous discharge was 6 months ago. Patient now here for follow-up feels reasonably well doesn't remember being shocked. Patient complains of shortness of breath recently still smokes has some mild wheezing  Past Medical History:  Diagnosis Date  . Diabetes mellitus without complication ([place])   . Hypertension   . MI (myocardial infarction) ([place])    x 5  . Pacemaker     Past Surgical History:  Procedure Laterality Date  . pacemaker/defibrillator Left     Family History  Problem Relation Age of Onset  . Hypertension Father   . Heart attack Father     Social History:  reports that she has never smoked. She has never used smokeless tobacco. She reports that she drinks alcohol. She reports that she does not use drugs.  Allergies:  Allergies  Allergen Reactions  . Celebrex [Celecoxib] Anaphylaxis  . Levaquin [Levofloxacin In D5w] Other (See Comments)    Heart arrhthymias  . Sulfa Antibiotics Other (See Comments)    Reaction: unknown  . Penicillins Rash    Has patient had a PCN reaction causing immediate  rash, facial/tongue/throat swelling, SOB or lightheadedness with hypotension: Unknown Has patient had a PCN reaction causing severe rash involving mucus membranes or skin necrosis: No Has patient had a PCN reaction that required hospitalization: No Has patient had a PCN reaction occurring within the last 10 years: No If all of the above answers are "NO", then may proceed with Cephalosporin use.     Medications: I have reviewed the patient's current medications.  Results for orders placed or performed during the hospital encounter of 02/04/17 (from the past 48 hour(s))  CBC with Differential/Platelet     Status: Abnormal   Collection Time: 02/04/17  9:17 PM  Result Value Ref Range   WBC 8.1 3.6 - 11.0 K/uL   RBC 3.69 (L) 3.80 - 5.20 MIL/uL   Hemoglobin 10.7 (L) 12.0 - 16.0 g/dL   HCT 31.8 (L) 35.0 - 47.0 %   MCV 86.1 80.0 - 100.0 fL   MCH 29.1 26.0 - 34.0 pg   MCHC 33.8 32.0 - 36.0 g/dL   RDW 18.1 (H) 11.5 - 14.5 %   Platelets 234 150 - 440 K/uL   Neutrophils Relative % 62 %   Neutro Abs 5.0 1.4 - 6.5 K/uL   Lymphocytes Relative 28 %   Lymphs Abs 2.3 1.0 - 3.6 K/uL   Monocytes Relative 8 %   Monocytes Absolute 0.7 0.2 - 0.9 K/uL   Eosinophils Relative 2 %   Eosinophils Absolute 0.1 0 - 0.7 K/uL   Basophils Relative 0 %   Basophils Absolute 0.0 0 - 0.1 K/uL  Comprehensive metabolic panel     Status: Abnormal  Collection Time: 02/04/17  9:17 PM  Result Value Ref Range   Sodium 142 135 - 145 mmol/L   Potassium 3.0 (L) 3.5 - 5.1 mmol/L   Chloride 103 101 - 111 mmol/L   CO2 27 22 - 32 mmol/L   Glucose, Bld 157 (H) 65 - 99 mg/dL   BUN 19 6 - 20 mg/dL   Creatinine, Ser 0.83 0.44 - 1.00 mg/dL   Calcium 9.1 8.9 - 10.3 mg/dL   Total Protein 7.4 6.5 - 8.1 g/dL   Albumin 3.9 3.5 - 5.0 g/dL   AST 21 15 - 41 U/L   ALT 11 (L) 14 - 54 U/L   Alkaline Phosphatase 89 38 - 126 U/L   Total Bilirubin 0.4 0.3 - 1.2 mg/dL   GFR calc non Af Amer >60 >60 mL/min   GFR calc Af Amer >60 >60  mL/min    Comment: (NOTE) The eGFR has been calculated using the CKD EPI equation. This calculation has not been validated in all clinical situations. eGFR's persistently <60 mL/min signify possible Chronic Kidney Disease.    Anion gap 12 5 - 15  Troponin I     Status: None   Collection Time: 02/04/17  9:17 PM  Result Value Ref Range   Troponin I <0.03 <0.03 ng/mL  Glucose, capillary     Status: Abnormal   Collection Time: 02/05/17 12:27 AM  Result Value Ref Range   Glucose-Capillary 122 (H) 65 - 99 mg/dL  Basic metabolic panel     Status: Abnormal   Collection Time: 02/05/17  5:36 AM  Result Value Ref Range   Sodium 142 135 - 145 mmol/L   Potassium 3.9 3.5 - 5.1 mmol/L   Chloride 106 101 - 111 mmol/L   CO2 26 22 - 32 mmol/L   Glucose, Bld 99 65 - 99 mg/dL   BUN 20 6 - 20 mg/dL   Creatinine, Ser 0.75 0.44 - 1.00 mg/dL   Calcium 8.8 (L) 8.9 - 10.3 mg/dL   GFR calc non Af Amer >60 >60 mL/min   GFR calc Af Amer >60 >60 mL/min    Comment: (NOTE) The eGFR has been calculated using the CKD EPI equation. This calculation has not been validated in all clinical situations. eGFR's persistently <60 mL/min signify possible Chronic Kidney Disease.    Anion gap 10 5 - 15  CBC     Status: Abnormal   Collection Time: 02/05/17  5:36 AM  Result Value Ref Range   WBC 8.3 3.6 - 11.0 K/uL   RBC 3.48 (L) 3.80 - 5.20 MIL/uL   Hemoglobin 10.0 (L) 12.0 - 16.0 g/dL   HCT 30.0 (L) 35.0 - 47.0 %   MCV 86.2 80.0 - 100.0 fL   MCH 28.8 26.0 - 34.0 pg   MCHC 33.5 32.0 - 36.0 g/dL   RDW 18.4 (H) 11.5 - 14.5 %   Platelets 211 150 - 440 K/uL  Glucose, capillary     Status: Abnormal   Collection Time: 02/05/17  8:02 AM  Result Value Ref Range   Glucose-Capillary 101 (H) 65 - 99 mg/dL  Glucose, capillary     Status: Abnormal   Collection Time: 02/05/17 11:26 AM  Result Value Ref Range   Glucose-Capillary 101 (H) 65 - 99 mg/dL  Glucose, capillary     Status: Abnormal   Collection Time: 02/05/17   4:42 PM  Result Value Ref Range   Glucose-Capillary 155 (H) 65 - 99 mg/dL    Dg Chest  2 View  Result Date: 02/04/2017 CLINICAL DATA:  Syncope EXAM: CHEST  2 VIEW COMPARISON:  None. FINDINGS: Left-sided duo lead pacing device. Mild cardiomegaly with aortic atherosclerosis. No consolidation or effusion. No pneumothorax. Degenerative changes of the spine. IMPRESSION: Mild to moderate cardiomegaly.  Negative for edema or infiltrate. Electronically Signed   By: Donavan Foil M.D.   On: 02/04/2017 21:49   Ct Head Wo Contrast  Result Date: 02/04/2017 CLINICAL DATA:  Seizure, syncope EXAM: CT HEAD WITHOUT CONTRAST TECHNIQUE: Contiguous axial images were obtained from the base of the skull through the vertex without intravenous contrast. COMPARISON:  None. FINDINGS: Brain: No acute territorial infarction, hemorrhage or intracranial mass is seen. 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 Mild ex vacuo dilatation of the right lateral ventricle. Mild to moderate small vessel ischemic changes of the white matter. Mild to moderate atrophy. Vascular: No hyperdense vessels. Left vertebral and bilateral carotid artery calcifications. Skull: No fracture or suspicious bone lesion Sinuses/Orbits: No acute finding. Other: None IMPRESSION: 1. No CT evidence for acute intracranial abnormality. 2. Old right frontal lobe infarct. Atrophy and small vessel ischemic changes of the white matter Electronically Signed   By: Donavan Foil M.D.   On: 02/04/2017 21:53    Review of Systems  Constitutional: Positive for malaise/fatigue.  HENT: Positive for congestion.   Eyes: Negative.   Respiratory: Positive for shortness of breath.   Cardiovascular: Positive for palpitations.  Gastrointestinal: Negative.   Genitourinary: Negative.   Musculoskeletal: Positive for falls and myalgias.  Skin: Negative.   Neurological: Positive for dizziness, loss of consciousness and weakness.  Endo/Heme/Allergies: Negative.    Psychiatric/Behavioral: Negative.    Blood pressure 127/70, pulse 70, temperature 97.6 F (36.4 C), temperature source Oral, resp. rate 18, height 5' 4"  (1.626 m), weight 177 lb 9.6 oz (80.6 kg), SpO2 94 %. Physical Exam  Nursing note and vitals reviewed. Constitutional: She is oriented to person, place, and time. She appears well-developed and well-nourished.  HENT:  Head: Normocephalic and atraumatic.  Eyes: Pupils are equal, round, and reactive to light. Conjunctivae and EOM are normal.  Neck: Normal range of motion. Neck supple.  Cardiovascular: Normal rate, regular rhythm and normal heart sounds.   Respiratory: Effort normal and breath sounds normal.  GI: Soft. Bowel sounds are normal.  Musculoskeletal: Normal range of motion.  Neurological: She is alert and oriented to person, place, and time. She has normal reflexes.  Skin: Skin is warm and dry.  Psychiatric: She has a normal mood and affect.    Assessment/Plan: Syncope Ventricular fibrillation Status post AICD discharge Hypertension COPD Smoking Known coronary disease Diabetes History of myocardial infarction History of permanent pacemaker AICD . PLAN Agree with admission for rule out myocardial infarction Follow-up cardiac enzymes and EKG Short-term anticoagulation therapy Recommend pacemaker AICD interrogation Continue diabetes management Maintain blood pressure control Consider inhalers steroid therapy for possible COPD Recommend amiodarone be loaded to help suppress ventricular rhythms Consider low-dose diuretics for possible mild heart failure Follow-up with Dr. Saralyn Pilar as an outpatient  Kahlani Graber D Luiz Trumpower 02/06/2017, 8:40 AM

## 2017-02-10 DIAGNOSIS — I340 Nonrheumatic mitral (valve) insufficiency: Secondary | ICD-10-CM | POA: Insufficient documentation

## 2017-02-10 DIAGNOSIS — E785 Hyperlipidemia, unspecified: Secondary | ICD-10-CM | POA: Insufficient documentation

## 2017-02-10 DIAGNOSIS — Z8679 Personal history of other diseases of the circulatory system: Secondary | ICD-10-CM | POA: Insufficient documentation

## 2017-02-10 DIAGNOSIS — I255 Ischemic cardiomyopathy: Secondary | ICD-10-CM | POA: Insufficient documentation

## 2017-02-10 DIAGNOSIS — I219 Acute myocardial infarction, unspecified: Secondary | ICD-10-CM | POA: Insufficient documentation

## 2017-02-10 DIAGNOSIS — Z95810 Presence of automatic (implantable) cardiac defibrillator: Secondary | ICD-10-CM | POA: Insufficient documentation

## 2017-02-10 DIAGNOSIS — E119 Type 2 diabetes mellitus without complications: Secondary | ICD-10-CM | POA: Insufficient documentation

## 2017-02-10 DIAGNOSIS — J449 Chronic obstructive pulmonary disease, unspecified: Secondary | ICD-10-CM | POA: Insufficient documentation

## 2017-03-04 ENCOUNTER — Inpatient Hospital Stay
Admission: EM | Admit: 2017-03-04 | Discharge: 2017-03-06 | DRG: 286 | Disposition: A | Discharge status: Other Acute Inpatient Hospital | Payer: Medicare Other | Attending: Internal Medicine | Admitting: Internal Medicine

## 2017-03-04 ENCOUNTER — Observation Stay: Payer: Medicare Other

## 2017-03-04 ENCOUNTER — Encounter: Payer: Self-pay | Admitting: Emergency Medicine

## 2017-03-04 ENCOUNTER — Emergency Department: Payer: Medicare Other

## 2017-03-04 DIAGNOSIS — Z4502 Encounter for adjustment and management of automatic implantable cardiac defibrillator: Secondary | ICD-10-CM

## 2017-03-04 DIAGNOSIS — I2510 Atherosclerotic heart disease of native coronary artery without angina pectoris: Secondary | ICD-10-CM | POA: Diagnosis present

## 2017-03-04 DIAGNOSIS — Z7951 Long term (current) use of inhaled steroids: Secondary | ICD-10-CM

## 2017-03-04 DIAGNOSIS — I4901 Ventricular fibrillation: Secondary | ICD-10-CM | POA: Diagnosis present

## 2017-03-04 DIAGNOSIS — I472 Ventricular tachycardia, unspecified: Secondary | ICD-10-CM

## 2017-03-04 DIAGNOSIS — R9431 Abnormal electrocardiogram [ECG] [EKG]: Secondary | ICD-10-CM

## 2017-03-04 DIAGNOSIS — Z9981 Dependence on supplemental oxygen: Secondary | ICD-10-CM

## 2017-03-04 DIAGNOSIS — I5043 Acute on chronic combined systolic (congestive) and diastolic (congestive) heart failure: Secondary | ICD-10-CM | POA: Diagnosis present

## 2017-03-04 DIAGNOSIS — N184 Chronic kidney disease, stage 4 (severe): Secondary | ICD-10-CM

## 2017-03-04 DIAGNOSIS — T50905A Adverse effect of unspecified drugs, medicaments and biological substances, initial encounter: Secondary | ICD-10-CM

## 2017-03-04 DIAGNOSIS — Z7984 Long term (current) use of oral hypoglycemic drugs: Secondary | ICD-10-CM

## 2017-03-04 DIAGNOSIS — Z8249 Family history of ischemic heart disease and other diseases of the circulatory system: Secondary | ICD-10-CM

## 2017-03-04 DIAGNOSIS — E1122 Type 2 diabetes mellitus with diabetic chronic kidney disease: Secondary | ICD-10-CM

## 2017-03-04 DIAGNOSIS — Z79890 Hormone replacement therapy (postmenopausal): Secondary | ICD-10-CM

## 2017-03-04 DIAGNOSIS — I110 Hypertensive heart disease with heart failure: Secondary | ICD-10-CM | POA: Diagnosis present

## 2017-03-04 DIAGNOSIS — I252 Old myocardial infarction: Secondary | ICD-10-CM

## 2017-03-04 DIAGNOSIS — Z95810 Presence of automatic (implantable) cardiac defibrillator: Secondary | ICD-10-CM

## 2017-03-04 DIAGNOSIS — Z7902 Long term (current) use of antithrombotics/antiplatelets: Secondary | ICD-10-CM

## 2017-03-04 DIAGNOSIS — R0602 Shortness of breath: Secondary | ICD-10-CM

## 2017-03-04 DIAGNOSIS — I255 Ischemic cardiomyopathy: Secondary | ICD-10-CM | POA: Diagnosis present

## 2017-03-04 DIAGNOSIS — I5022 Chronic systolic (congestive) heart failure: Secondary | ICD-10-CM | POA: Diagnosis present

## 2017-03-04 DIAGNOSIS — Z79899 Other long term (current) drug therapy: Secondary | ICD-10-CM

## 2017-03-04 DIAGNOSIS — I420 Dilated cardiomyopathy: Secondary | ICD-10-CM | POA: Diagnosis present

## 2017-03-04 DIAGNOSIS — I5042 Chronic combined systolic (congestive) and diastolic (congestive) heart failure: Secondary | ICD-10-CM | POA: Diagnosis present

## 2017-03-04 DIAGNOSIS — J449 Chronic obstructive pulmonary disease, unspecified: Secondary | ICD-10-CM | POA: Diagnosis present

## 2017-03-04 DIAGNOSIS — E119 Type 2 diabetes mellitus without complications: Secondary | ICD-10-CM | POA: Diagnosis present

## 2017-03-04 DIAGNOSIS — Z880 Allergy status to penicillin: Secondary | ICD-10-CM

## 2017-03-04 DIAGNOSIS — Z882 Allergy status to sulfonamides status: Secondary | ICD-10-CM

## 2017-03-04 DIAGNOSIS — I10 Essential (primary) hypertension: Secondary | ICD-10-CM | POA: Diagnosis present

## 2017-03-04 DIAGNOSIS — Z8674 Personal history of sudden cardiac arrest: Secondary | ICD-10-CM

## 2017-03-04 DIAGNOSIS — N183 Chronic kidney disease, stage 3 unspecified: Secondary | ICD-10-CM

## 2017-03-04 DIAGNOSIS — I4721 Torsades de pointes: Secondary | ICD-10-CM

## 2017-03-04 DIAGNOSIS — Z955 Presence of coronary angioplasty implant and graft: Secondary | ICD-10-CM

## 2017-03-04 DIAGNOSIS — E876 Hypokalemia: Secondary | ICD-10-CM | POA: Diagnosis present

## 2017-03-04 HISTORY — DX: Chronic combined systolic (congestive) and diastolic (congestive) heart failure: I50.42

## 2017-03-04 LAB — CBC WITH DIFFERENTIAL/PLATELET
Basophils Absolute: 0 10*3/uL (ref 0–0.1)
Basophils Relative: 0 %
Eosinophils Absolute: 0.1 10*3/uL (ref 0–0.7)
Eosinophils Relative: 1 %
HCT: 32.6 % — ABNORMAL LOW (ref 35.0–47.0)
Hemoglobin: 10.9 g/dL — ABNORMAL LOW (ref 12.0–16.0)
Lymphocytes Relative: 33 %
Lymphs Abs: 3.9 10*3/uL — ABNORMAL HIGH (ref 1.0–3.6)
MCH: 29.2 pg (ref 26.0–34.0)
MCHC: 33.3 g/dL (ref 32.0–36.0)
MCV: 87.8 fL (ref 80.0–100.0)
Monocytes Absolute: 1.2 10*3/uL — ABNORMAL HIGH (ref 0.2–0.9)
Monocytes Relative: 10 %
Neutro Abs: 6.5 10*3/uL (ref 1.4–6.5)
Neutrophils Relative %: 56 %
Platelets: 248 10*3/uL (ref 150–440)
RBC: 3.72 MIL/uL — ABNORMAL LOW (ref 3.80–5.20)
RDW: 18.3 % — ABNORMAL HIGH (ref 11.5–14.5)
WBC: 11.7 10*3/uL — ABNORMAL HIGH (ref 3.6–11.0)

## 2017-03-04 LAB — COMPREHENSIVE METABOLIC PANEL
ALT: 12 U/L — ABNORMAL LOW (ref 14–54)
AST: 23 U/L (ref 15–41)
Albumin: 3.9 g/dL (ref 3.5–5.0)
Alkaline Phosphatase: 73 U/L (ref 38–126)
Anion gap: 14 (ref 5–15)
BUN: 23 mg/dL — ABNORMAL HIGH (ref 6–20)
CO2: 24 mmol/L (ref 22–32)
Calcium: 9.6 mg/dL (ref 8.9–10.3)
Chloride: 101 mmol/L (ref 101–111)
Creatinine, Ser: 1.32 mg/dL — ABNORMAL HIGH (ref 0.44–1.00)
GFR calc Af Amer: 45 mL/min — ABNORMAL LOW (ref >60)
GFR calc non Af Amer: 38 mL/min — ABNORMAL LOW (ref >60)
Glucose, Bld: 180 mg/dL — ABNORMAL HIGH (ref 65–99)
Potassium: 3.5 mmol/L (ref 3.5–5.1)
Sodium: 139 mmol/L (ref 135–145)
Total Bilirubin: 0.8 mg/dL (ref 0.3–1.2)
Total Protein: 7.9 g/dL (ref 6.5–8.1)

## 2017-03-04 LAB — TROPONIN I: Troponin I: <0.03 ng/mL (ref <0.03)

## 2017-03-04 LAB — BRAIN NATRIURETIC PEPTIDE: B Natriuretic Peptide: 833 pg/mL — ABNORMAL HIGH (ref 0.0–100.0)

## 2017-03-04 MED ORDER — MAGNESIUM SULFATE 4 GM/100ML IV SOLN
4.0000 g | Freq: Once | INTRAVENOUS | Status: AC
  Administered 2017-03-04: 4 g via INTRAVENOUS
  Filled 2017-03-04: qty 100

## 2017-03-04 MED ORDER — AMIODARONE IV BOLUS ONLY 150 MG/100ML
INTRAVENOUS | Status: AC
  Administered 2017-03-04: 150 mg
  Filled 2017-03-04: qty 100

## 2017-03-04 NOTE — ED Provider Notes (Signed)
[facility] Emergency Department Provider Note  ____________________________________________   First MD Initiated Contact with Patient 03/04/17 2245     (approximate)  I have reviewed the triage vital signs and the nursing notes.   HISTORY  Chief Complaint Loss of Consciousness (due to VTach, AICD repeat shocks)  Level V exemption history Limited by the patient's clinical condition  HPI Heather Peterson is a 76 y.o. female who comes to the emergency department via EMS after having her AICD fired 3 times while at home. According to family at bedside the patient would syncopized time and that her AICD would fire and then shortly thereafter she would wake up. EMS recorded ventricular tachycardia en route twice each time responding to AICD shocks. The patient would become unconscious and then regained consciousness.   Past Medical History:  Diagnosis Date  . Chronic combined systolic and diastolic CHF (congestive heart failure) ([place])   . Diabetes mellitus without complication ([place])   . Hypertension   . MI (myocardial infarction) ([place])    x 5  . Pacemaker     Patient Active Problem List   Diagnosis Date Noted  . Defibrillator discharge 03/04/2017  . Chronic combined systolic and diastolic CHF (congestive heart failure) ([place]) 03/04/2017  . Syncope and collapse 02/04/2017  . CAD (coronary artery disease) 02/04/2017  . HTN (hypertension) 02/04/2017  . Diabetes ([place]) 02/04/2017    Past Surgical History:  Procedure Laterality Date  . pacemaker/defibrillator Left     Prior to Admission medications   Medication Sig Start Date End Date Taking? Authorizing Provider  acetaminophen (TYLENOL) 500 MG tablet Take 500 mg by mouth at bedtime.   Yes [provider]  albuterol (PROVENTIL HFA;VENTOLIN HFA) 108 (90 Base) MCG/ACT inhaler Inhale 2 puffs into the lungs every 4 (four) hours as needed for wheezing or shortness of breath. 02/05/17  Yes Gouru,  Illene Silver, MD  amiodarone (PACERONE) 200 MG tablet Take 1 tablet (200 mg total) by mouth 2 (two) times daily. Amiodarone 200 mg twice a day for one week followed by 200 mg once daily Patient taking differently: Take 200 mg by mouth daily.  02/05/17  Yes Gouru, Illene Silver, MD  carvedilol (COREG) 3.125 MG tablet Take 3.125 mg by mouth 2 (two) times daily. 12/30/16  Yes [provider]  clopidogrel (PLAVIX) 75 MG tablet Take 75 mg by mouth daily. 12/30/16  Yes [provider]  FLUoxetine (PROZAC) 20 MG capsule Take 20 mg by mouth 2 (two) times daily. 10/28/16  Yes [provider]  Fluticasone-Salmeterol (ADVAIR DISKUS) 250-50 MCG/DOSE AEPB Inhale 1 puff into the lungs 2 (two) times daily. 02/05/17 02/05/18 Yes Gouru, Aruna, MD  levothyroxine (SYNTHROID, LEVOTHROID) 50 MCG tablet Take 50 mcg by mouth daily. 12/13/16  Yes [provider]  losartan (COZAAR) 50 MG tablet Take 50 mg by mouth daily.   Yes [provider]  metFORMIN (GLUCOPHAGE-XR) 750 MG 24 hr tablet Take 375 mg by mouth 2 (two) times daily.    Yes [provider]  Multiple Vitamin (MULTIVITAMIN WITH MINERALS) TABS tablet Take 1 tablet by mouth daily.   Yes [provider]  niacin 500 MG CR capsule Take 500 mg by mouth 2 (two) times daily with a meal.   Yes [provider]  nitroGLYCERIN (NITROSTAT) 0.4 MG SL tablet Place 0.4 mg under the tongue every 5 (five) minutes as needed for chest pain.   Yes [provider]  omega-3 acid ethyl esters (LOVAZA) 1 g capsule  Take 1 g by mouth daily.    Yes [provider]  pioglitazone (ACTOS) 15 MG tablet Take 15 mg by mouth daily. 11/19/16  Yes [provider]  simvastatin (ZOCOR) 40 MG tablet Take 40 mg by mouth at bedtime.    Yes [provider]    Allergies Celebrex [celecoxib]; Levaquin [levofloxacin in d5w]; Sulfa antibiotics; and Penicillins  Family History  Problem Relation Age of Onset  .  Hypertension Father   . Heart attack Father     Social History Social History  Substance Use Topics  . Smoking status: Never Smoker  . Smokeless tobacco: Never Used  . Alcohol use Yes     Comment: occasionally    Review of Systems Level V exemption history Limited by the patient's clinical condition  ____________________________________________   PHYSICAL EXAM:  VITAL SIGNS: ED Triage Vitals [03/04/17 2244]  Enc Vitals Group     BP      Pulse Rate 80     Resp 20     Temp      Temp src      SpO2      Weight      Height      Head Circumference      Peak Flow      Pain Score      Pain Loc      Pain Edu?      Excl. in [place]?     Constitutional: Alert and oriented 4 pleasant cooperative speaks in full clear sentences mild diaphoresis Eyes: PERRL EOMI. Head: Atraumatic. Nose: No congestion/rhinnorhea. Mouth/Throat: No trismus Neck: No stridor.   Cardiovascular: Normal rate, regular rhythm. Grossly normal heart sounds.  Good peripheral circulation.  AICD pocket left upper chest appears healthy Respiratory: Normal respiratory effort.  No retractions. Lungs CTAB and moving good air Gastrointestinal: Soft nontender Musculoskeletal: No lower extremity edema   Neurologic:  Normal speech and language. No gross focal neurologic deficits are appreciated. Skin:  Mild diaphoresis Psychiatric: Mood and affect are normal. Speech and behavior are normal.    ____________________________________________   DIFFERENTIAL includes but not limited to  Hypomagnesemia, acute coronary syndrome, ventricular tachycardia, medication noncompliance ____________________________________________   LABS (all labs ordered are listed, but only abnormal results are displayed)  Labs Reviewed  COMPREHENSIVE METABOLIC PANEL - Abnormal; Notable for the following:       Result Value   Glucose, Bld 180 (*)    BUN 23 (*)    Creatinine, Ser 1.32 (*)    ALT 12 (*)    GFR calc non Af Amer 38 (*)      GFR calc Af Amer 45 (*)    All other components within normal limits  CBC WITH DIFFERENTIAL/PLATELET - Abnormal; Notable for the following:    WBC 11.7 (*)    RBC 3.72 (*)    Hemoglobin 10.9 (*)    HCT 32.6 (*)    RDW 18.3 (*)    Lymphs Abs 3.9 (*)    Monocytes Absolute 1.2 (*)    All other components within normal limits  BRAIN NATRIURETIC PEPTIDE - Abnormal; Notable for the following:    B Natriuretic Peptide 833.0 (*)    All other components within normal limits  TROPONIN I    Bloodwork reviewed and interpreted by me shows some evidence of fluid overload but no signs of acute ischemia. Electrolytes essentially normal. __________________________________________  EKG ED ECG REPORT I, Darel Hong, the attending physician, personally viewed and interpreted this ECG.  Date: 03/04/2017 EKG Time:  Rate: 70 Rhythm: Atrial paced rhythm QRS Axis: normal Intervals: normal ST/T Wave abnormalities: normal Narrative Interpretation: no evidence of acute ischemia  ____________________________________________  RADIOLOGY   ____________________________________________   PROCEDURES  Procedure(s) performed: no  Procedures  Critical Care performed: yes  CRITICAL CARE Performed by: Darel Hong   Total critical care time: 35 minutes  Critical care time was exclusive of separately billable procedures and treating other patients.  Critical care was necessary to treat or prevent imminent or life-threatening deterioration.  Critical care was time spent personally by me on the following activities: development of treatment plan with patient and/or surrogate as well as nursing, discussions with consultants, evaluation of patient's response to treatment, examination of patient, obtaining history from patient or surrogate, ordering and performing treatments and interventions, ordering and review of laboratory studies, ordering and review of radiographic studies, pulse oximetry  and re-evaluation of patient's condition.   Observation: no ____________________________________________   INITIAL IMPRESSION / ASSESSMENT AND PLAN / ED COURSE  Pertinent labs & imaging results that were available during my care of the patient were reviewed by me and considered in my medical decision making (see chart for details).       ----------------------------------------- 11:00 PM on 03/04/2017 ----------------------------------------- I discussed the case with the patient's cardiologist Dr. Josefa Half who recommends intravenous amiodarone as well as magnesium and he will kindly consult on the patient. The patient has had no events while here in the emergency department. I discussed the case with the hospitalist Dr. Jannifer Franklin who is graciously agreed to admit the patient to his service. I discussed with the family and the patient verbalizes understanding and agreement with the plan.  ____________________________________________   FINAL CLINICAL IMPRESSION(S) / ED DIAGNOSES  Final diagnoses:  Ventricular tachycardia ([place])      NEW MEDICATIONS STARTED DURING THIS VISIT:  New Prescriptions   No medications on file     Note:  This document was prepared using Dragon voice recognition software and may include unintentional dictation errors.     Darel Hong, MD 03/04/17 [phone]

## 2017-03-04 NOTE — H&P (Signed)
[facility] [place] NAME: Heather Peterson    MR#:  [id number]  DATE OF BIRTH:  15-Sep-1940  DATE OF ADMISSION:  03/04/2017  PRIMARY CARE PHYSICIAN: [name], MD   REQUESTING/REFERRING PHYSICIAN: Mable Paris, MD  CHIEF COMPLAINT:   Chief Complaint  Patient presents with  . Loss of Consciousness    due to VTach, AICD repeat shocks    HISTORY OF PRESENT ILLNESS:  Heather Peterson  is a 76 y.o. female who presents with multiple syncopal episodes tonight followed by firing of her defibrillator. This happened a total of 5 times today. She states that it happened 3 times at home where she would get lightheaded, experienced syncope, and then be shocked by her defibrillator. She would then wake up. This happened witnessed by EMS to times in the ambulance. It is not recurred again since she has been here in the ED. Cardiology was contacted by ED physician and recommended IV amiodarone, IV magnesium, and they will see her in the morning. Hospitalists were called for admission.  PAST MEDICAL HISTORY:   Past Medical History:  Diagnosis Date  . Chronic combined systolic and diastolic CHF (congestive heart failure) ([place])   . Diabetes mellitus without complication ([place])   . Hypertension   . MI (myocardial infarction) ([place])    x 5  . Pacemaker     PAST SURGICAL HISTORY:   Past Surgical History:  Procedure Laterality Date  . pacemaker/defibrillator Left     SOCIAL HISTORY:   Social History  Substance Use Topics  . Smoking status: Never Smoker  . Smokeless tobacco: Never Used  . Alcohol use Yes     Comment: occasionally    FAMILY HISTORY:   Family History  Problem Relation Age of Onset  . Hypertension Father   . Heart attack Father     DRUG ALLERGIES:   Allergies  Allergen Reactions  . Celebrex [Celecoxib] Anaphylaxis  . Levaquin [Levofloxacin In D5w] Other (See Comments)    Heart arrhthymias  . Sulfa  Antibiotics Other (See Comments)    Reaction: unknown  . Penicillins Rash    Has patient had a PCN reaction causing immediate rash, facial/tongue/throat swelling, SOB or lightheadedness with hypotension: Unknown Has patient had a PCN reaction causing severe rash involving mucus membranes or skin necrosis: No Has patient had a PCN reaction that required hospitalization: No Has patient had a PCN reaction occurring within the last 10 years: No If all of the above answers are "NO", then may proceed with Cephalosporin use.     MEDICATIONS AT HOME:   Prior to Admission medications   Medication Sig Start Date End Date Taking? Authorizing Provider  acetaminophen (TYLENOL) 500 MG tablet Take 500 mg by mouth at bedtime.   Yes [provider]  albuterol (PROVENTIL HFA;VENTOLIN HFA) 108 (90 Base) MCG/ACT inhaler Inhale 2 puffs into the lungs every 4 (four) hours as needed for wheezing or shortness of breath. 02/05/17  Yes Gouru, Illene Silver, MD  amiodarone (PACERONE) 200 MG tablet Take 1 tablet (200 mg total) by mouth 2 (two) times daily. Amiodarone 200 mg twice a day for one week followed by 200 mg once daily Patient taking differently: Take 200 mg by mouth daily.  02/05/17  Yes Gouru, Illene Silver, MD  carvedilol (COREG) 3.125 MG tablet Take 3.125 mg by mouth 2 (two) times daily. 12/30/16  Yes [provider]  clopidogrel (PLAVIX) 75 MG tablet Take 75 mg by mouth daily. 12/30/16  Yes [provider]  FLUoxetine (PROZAC) 20 MG capsule Take 20 mg by mouth 2 (two) times daily. 10/28/16  Yes [provider]  Fluticasone-Salmeterol (ADVAIR DISKUS) 250-50 MCG/DOSE AEPB Inhale 1 puff into the lungs 2 (two) times daily. 02/05/17 02/05/18 Yes Gouru, Aruna, MD  levothyroxine (SYNTHROID, LEVOTHROID) 50 MCG tablet Take 50 mcg by mouth daily. 12/13/16  Yes [provider]  losartan (COZAAR) 50 MG tablet Take 50 mg by mouth daily.   Yes [provider]  metFORMIN (GLUCOPHAGE-XR) 750  MG 24 hr tablet Take 375 mg by mouth 2 (two) times daily.    Yes [provider]  Multiple Vitamin (MULTIVITAMIN WITH MINERALS) TABS tablet Take 1 tablet by mouth daily.   Yes [provider]  niacin 500 MG CR capsule Take 500 mg by mouth 2 (two) times daily with a meal.   Yes [provider]  nitroGLYCERIN (NITROSTAT) 0.4 MG SL tablet Place 0.4 mg under the tongue every 5 (five) minutes as needed for chest pain.   Yes [provider]  omega-3 acid ethyl esters (LOVAZA) 1 g capsule Take 1 g by mouth daily.    Yes [provider]  pioglitazone (ACTOS) 15 MG tablet Take 15 mg by mouth daily. 11/19/16  Yes [provider]  simvastatin (ZOCOR) 40 MG tablet Take 40 mg by mouth at bedtime.    Yes [provider]    REVIEW OF SYSTEMS:  Review of Systems  Constitutional: Positive for malaise/fatigue. Negative for chills, fever and weight loss.  HENT: Negative for ear pain, hearing loss and tinnitus.   Eyes: Negative for blurred vision, double vision, pain and redness.  Respiratory: Positive for shortness of breath. Negative for cough and hemoptysis.   Cardiovascular: Positive for orthopnea and leg swelling. Negative for chest pain and palpitations.  Gastrointestinal: Negative for abdominal pain, constipation, diarrhea, nausea and vomiting.  Genitourinary: Negative for dysuria, frequency and hematuria.  Musculoskeletal: Negative for back pain, joint pain and neck pain.  Skin:       No acne, rash, or lesions  Neurological: Positive for loss of consciousness. Negative for dizziness, tremors, focal weakness and weakness.  Endo/Heme/Allergies: Negative for polydipsia. Does not bruise/bleed easily.  Psychiatric/Behavioral: Negative for depression. The patient is not nervous/anxious and does not have insomnia.      VITAL SIGNS:   Vitals:   03/04/17 2244 03/04/17 2246  Pulse: 80   Resp: 20   Temp: 98.9 F (37.2 C)   TempSrc: Oral    Weight:  79.4 kg (175 lb)  Height:  5' 4"  (1.626 m)   Wt Readings from Last 3 Encounters:  03/04/17 79.4 kg (175 lb)  02/05/17 80.6 kg (177 lb 9.6 oz)    PHYSICAL EXAMINATION:  Physical Exam  Vitals reviewed. Constitutional: She is oriented to person, place, and time. She appears well-developed and well-nourished. No distress.  HENT:  Head: Normocephalic and atraumatic.  Mouth/Throat: Oropharynx is clear and moist.  Eyes: Pupils are equal, round, and reactive to light. Conjunctivae and EOM are normal. No scleral icterus.  Neck: Normal range of motion. Neck supple. No JVD present. No thyromegaly present.  Cardiovascular: Normal rate, regular rhythm and intact distal pulses.  Exam reveals no gallop and no friction rub.   Murmur heard. Respiratory: Effort normal. No respiratory distress. She has no wheezes. She has rales.  GI: Soft. Bowel sounds are normal. She exhibits no distension. There is no tenderness.  Musculoskeletal: Normal range of motion. She exhibits edema.  No arthritis, no gout  Lymphadenopathy:    She has no cervical adenopathy.  Neurological: She is alert and oriented to person, place, and time. No cranial nerve deficit.  No dysarthria, no aphasia  Skin: Skin is warm and dry. No rash noted. No erythema.  Psychiatric: She has a normal mood and affect. Her behavior is normal. Judgment and thought content normal.    LABORATORY PANEL:   CBC  Recent Labs Lab 03/04/17 2247  WBC 11.7*  HGB 10.9*  HCT 32.6*  PLT 248   ------------------------------------------------------------------------------------------------------------------  Chemistries   Recent Labs Lab 03/04/17 2247  NA 139  K 3.5  CL 101  CO2 24  GLUCOSE 180*  BUN 23*  CREATININE 1.32*  CALCIUM 9.6  AST 23  ALT 12*  ALKPHOS 73  BILITOT 0.8   ------------------------------------------------------------------------------------------------------------------  Cardiac Enzymes  Recent  Labs Lab 03/04/17 2247  TROPONINI <0.03   ------------------------------------------------------------------------------------------------------------------  RADIOLOGY:  No results found.  EKG:   Orders placed or performed during the hospital encounter of 03/04/17  . ED EKG  . ED EKG  . EKG 12-Lead  . EKG 12-Lead    IMPRESSION AND PLAN:  Principal Problem:   Defibrillator discharge - patient had several episodes of wide complex tachycardia on her rhythm strip, suspect this is likely the rhythm that triggered her AICD firing. We have started her on IV amiodarone, gave her a significant dose of IV magnesium. We will trend her cardiac enzymes and have cardiology see her in the morning Active Problems:   CAD (coronary artery disease) - continue home meds, other workup as above   Acute on chronic combined systolic and diastolic CHF (congestive heart failure) ([place]) - continue home meds, increase her diuresis with some IV Lasix for tonight, other workup as above including cardiology consult   HTN (hypertension) - continue home meds   Diabetes ([place]) - sliding scale insulin with corresponding glucose checks  All the records are reviewed and case discussed with ED provider. Management plans discussed with the patient and/or family.  DVT PROPHYLAXIS: SubQ lovenox  GI PROPHYLAXIS: None  ADMISSION STATUS: Observation  CODE STATUS: Full Code Status History    Date Active Date Inactive Code Status Order ID Comments User Context   02/04/2017 11:40 PM 02/05/2017  9:31 PM Full Code [id number]  Lance Coon, MD ED      TOTAL TIME TAKING CARE OF THIS PATIENT: 40 minutes.   Heather Peterson [place] 03/04/2017, 11:44 PM  [employer]  [phone]  CC: Primary care physician; [name], MD  Note:  This document was prepared using Dragon voice recognition software and may include unintentional dictation errors.

## 2017-03-04 NOTE — ED Triage Notes (Signed)
Pt presents to ED via EMS frome home c/o syncopal episode r/t Vtach and repeated shocks from defribillator

## 2017-03-05 ENCOUNTER — Encounter
Admission: EM | Disposition: A | Discharge status: Other Acute Inpatient Hospital | Payer: Self-pay | Source: Home / Self Care | Attending: Internal Medicine

## 2017-03-05 DIAGNOSIS — Z8249 Family history of ischemic heart disease and other diseases of the circulatory system: Secondary | ICD-10-CM | POA: Diagnosis not present

## 2017-03-05 DIAGNOSIS — Z95810 Presence of automatic (implantable) cardiac defibrillator: Secondary | ICD-10-CM | POA: Diagnosis not present

## 2017-03-05 DIAGNOSIS — I2510 Atherosclerotic heart disease of native coronary artery without angina pectoris: Secondary | ICD-10-CM | POA: Diagnosis present

## 2017-03-05 DIAGNOSIS — I4901 Ventricular fibrillation: Secondary | ICD-10-CM | POA: Diagnosis present

## 2017-03-05 DIAGNOSIS — Z79890 Hormone replacement therapy (postmenopausal): Secondary | ICD-10-CM | POA: Diagnosis not present

## 2017-03-05 DIAGNOSIS — I420 Dilated cardiomyopathy: Secondary | ICD-10-CM | POA: Diagnosis present

## 2017-03-05 DIAGNOSIS — Z880 Allergy status to penicillin: Secondary | ICD-10-CM | POA: Diagnosis not present

## 2017-03-05 DIAGNOSIS — Z882 Allergy status to sulfonamides status: Secondary | ICD-10-CM | POA: Diagnosis not present

## 2017-03-05 DIAGNOSIS — I255 Ischemic cardiomyopathy: Secondary | ICD-10-CM | POA: Diagnosis present

## 2017-03-05 DIAGNOSIS — I472 Ventricular tachycardia, unspecified: Secondary | ICD-10-CM

## 2017-03-05 DIAGNOSIS — J449 Chronic obstructive pulmonary disease, unspecified: Secondary | ICD-10-CM | POA: Diagnosis present

## 2017-03-05 DIAGNOSIS — Z7984 Long term (current) use of oral hypoglycemic drugs: Secondary | ICD-10-CM | POA: Diagnosis not present

## 2017-03-05 DIAGNOSIS — I252 Old myocardial infarction: Secondary | ICD-10-CM | POA: Diagnosis not present

## 2017-03-05 DIAGNOSIS — I110 Hypertensive heart disease with heart failure: Secondary | ICD-10-CM | POA: Diagnosis present

## 2017-03-05 DIAGNOSIS — E876 Hypokalemia: Secondary | ICD-10-CM | POA: Diagnosis present

## 2017-03-05 DIAGNOSIS — Z955 Presence of coronary angioplasty implant and graft: Secondary | ICD-10-CM | POA: Diagnosis not present

## 2017-03-05 DIAGNOSIS — Z9981 Dependence on supplemental oxygen: Secondary | ICD-10-CM | POA: Diagnosis not present

## 2017-03-05 DIAGNOSIS — Z7902 Long term (current) use of antithrombotics/antiplatelets: Secondary | ICD-10-CM | POA: Diagnosis not present

## 2017-03-05 DIAGNOSIS — Z8674 Personal history of sudden cardiac arrest: Secondary | ICD-10-CM | POA: Diagnosis not present

## 2017-03-05 DIAGNOSIS — I5043 Acute on chronic combined systolic (congestive) and diastolic (congestive) heart failure: Secondary | ICD-10-CM | POA: Diagnosis present

## 2017-03-05 DIAGNOSIS — Z7951 Long term (current) use of inhaled steroids: Secondary | ICD-10-CM | POA: Diagnosis not present

## 2017-03-05 DIAGNOSIS — E119 Type 2 diabetes mellitus without complications: Secondary | ICD-10-CM | POA: Diagnosis present

## 2017-03-05 DIAGNOSIS — Z79899 Other long term (current) drug therapy: Secondary | ICD-10-CM | POA: Diagnosis not present

## 2017-03-05 HISTORY — PX: LEFT HEART CATH AND CORONARY ANGIOGRAPHY: CATH118249

## 2017-03-05 LAB — CBC
HCT: 30.1 % — ABNORMAL LOW (ref 35.0–47.0)
Hemoglobin: 9.9 g/dL — ABNORMAL LOW (ref 12.0–16.0)
MCH: 28.7 pg (ref 26.0–34.0)
MCHC: 32.9 g/dL (ref 32.0–36.0)
MCV: 87.3 fL (ref 80.0–100.0)
Platelets: 193 10*3/uL (ref 150–440)
RBC: 3.45 MIL/uL — ABNORMAL LOW (ref 3.80–5.20)
RDW: 18.2 % — ABNORMAL HIGH (ref 11.5–14.5)
WBC: 7.1 10*3/uL (ref 3.6–11.0)

## 2017-03-05 LAB — BASIC METABOLIC PANEL
Anion gap: 10 (ref 5–15)
BUN: 20 mg/dL (ref 6–20)
CO2: 27 mmol/L (ref 22–32)
Calcium: 8.7 mg/dL — ABNORMAL LOW (ref 8.9–10.3)
Chloride: 102 mmol/L (ref 101–111)
Creatinine, Ser: 1.11 mg/dL — ABNORMAL HIGH (ref 0.44–1.00)
GFR calc Af Amer: 55 mL/min — ABNORMAL LOW (ref >60)
GFR calc non Af Amer: 47 mL/min — ABNORMAL LOW (ref >60)
Glucose, Bld: 109 mg/dL — ABNORMAL HIGH (ref 65–99)
Potassium: 3.2 mmol/L — ABNORMAL LOW (ref 3.5–5.1)
Sodium: 139 mmol/L (ref 135–145)

## 2017-03-05 LAB — GLUCOSE, CAPILLARY
Glucose-Capillary: 98 mg/dL (ref 65–99)
Glucose-Capillary: 89 mg/dL (ref 65–99)

## 2017-03-05 LAB — TROPONIN I
Troponin I: 0.3 ng/mL (ref <0.03)
Troponin I: 0.25 ng/mL (ref <0.03)
Troponin I: 0.16 ng/mL (ref <0.03)

## 2017-03-05 LAB — MAGNESIUM: Magnesium: 2.6 mg/dL — ABNORMAL HIGH (ref 1.7–2.4)

## 2017-03-05 SURGERY — LEFT HEART CATH AND CORONARY ANGIOGRAPHY
Anesthesia: Moderate Sedation

## 2017-03-05 MED ORDER — IPRATROPIUM-ALBUTEROL 0.5-2.5 (3) MG/3ML IN SOLN
3.0000 mL | RESPIRATORY_TRACT | Status: DC | PRN
  Administered 2017-03-05 – 2017-03-06 (×3): 3 mL via RESPIRATORY_TRACT
  Filled 2017-03-05 (×3): qty 3

## 2017-03-05 MED ORDER — POTASSIUM CHLORIDE CRYS ER 20 MEQ PO TBCR
40.0000 meq | EXTENDED_RELEASE_TABLET | Freq: Once | ORAL | Status: AC
  Administered 2017-03-05: 40 meq via ORAL
  Filled 2017-03-05: qty 2

## 2017-03-05 MED ORDER — AMIODARONE HCL IN DEXTROSE 360-4.14 MG/200ML-% IV SOLN
30.0000 mg/h | INTRAVENOUS | Status: DC
  Administered 2017-03-05 – 2017-03-06 (×3): 30 mg/h via INTRAVENOUS
  Filled 2017-03-05 (×2): qty 200

## 2017-03-05 MED ORDER — SODIUM CHLORIDE 0.9% FLUSH: 3.0000 mL | INTRAVENOUS | Status: DC | PRN

## 2017-03-05 MED ORDER — ONDANSETRON HCL 4 MG PO TABS: 4.0000 mg | ORAL_TABLET | Freq: Four times a day (QID) | ORAL | Status: DC | PRN

## 2017-03-05 MED ORDER — AMIODARONE HCL IN DEXTROSE 360-4.14 MG/200ML-% IV SOLN: 30.0000 mg/h | INTRAVENOUS | Status: DC

## 2017-03-05 MED ORDER — HEPARIN (PORCINE) IN NACL 2-0.9 UNIT/ML-% IJ SOLN
INTRAMUSCULAR | Status: AC
  Filled 2017-03-05: qty 500

## 2017-03-05 MED ORDER — LOSARTAN POTASSIUM 50 MG PO TABS
50.0000 mg | ORAL_TABLET | Freq: Every day | ORAL | Status: DC
  Administered 2017-03-05: 50 mg via ORAL
  Filled 2017-03-05: qty 1

## 2017-03-05 MED ORDER — SODIUM CHLORIDE 0.9 % WEIGHT BASED INFUSION
3.0000 mL/kg/h | INTRAVENOUS | Status: AC
  Administered 2017-03-05: 3 mL/kg/h via INTRAVENOUS

## 2017-03-05 MED ORDER — MORPHINE SULFATE (PF) 2 MG/ML IV SOLN: 2.0000 mg | INTRAVENOUS | Status: DC | PRN

## 2017-03-05 MED ORDER — ACETAMINOPHEN 325 MG PO TABS
650.0000 mg | ORAL_TABLET | ORAL | Status: DC | PRN
  Administered 2017-03-05 – 2017-03-06 (×3): 650 mg via ORAL
  Filled 2017-03-05 (×3): qty 2

## 2017-03-05 MED ORDER — ONDANSETRON HCL 4 MG/2ML IJ SOLN: 4.0000 mg | Freq: Four times a day (QID) | INTRAMUSCULAR | Status: DC | PRN

## 2017-03-05 MED ORDER — FENTANYL CITRATE (PF) 100 MCG/2ML IJ SOLN
INTRAMUSCULAR | Status: AC
  Filled 2017-03-05: qty 2

## 2017-03-05 MED ORDER — SODIUM CHLORIDE 0.9% FLUSH
3.0000 mL | Freq: Two times a day (BID) | INTRAVENOUS | Status: DC
  Administered 2017-03-05: 3 mL via INTRAVENOUS

## 2017-03-05 MED ORDER — MIDAZOLAM HCL 2 MG/2ML IJ SOLN
INTRAMUSCULAR | Status: DC | PRN
  Administered 2017-03-05: 1 mg via INTRAVENOUS

## 2017-03-05 MED ORDER — FENTANYL CITRATE (PF) 100 MCG/2ML IJ SOLN
INTRAMUSCULAR | Status: DC | PRN
  Administered 2017-03-05 (×2): 25 ug via INTRAVENOUS

## 2017-03-05 MED ORDER — ENOXAPARIN SODIUM 40 MG/0.4ML ~~LOC~~ SOLN
40.0000 mg | SUBCUTANEOUS | Status: DC
  Administered 2017-03-05: 40 mg via SUBCUTANEOUS
  Filled 2017-03-05: qty 0.4

## 2017-03-05 MED ORDER — CLOPIDOGREL BISULFATE 75 MG PO TABS
75.0000 mg | ORAL_TABLET | Freq: Every day | ORAL | Status: DC
  Administered 2017-03-05 – 2017-03-06 (×2): 75 mg via ORAL
  Filled 2017-03-05 (×2): qty 1

## 2017-03-05 MED ORDER — ASPIRIN 81 MG PO CHEW
81.0000 mg | CHEWABLE_TABLET | ORAL | Status: AC
  Administered 2017-03-05: 81 mg via ORAL

## 2017-03-05 MED ORDER — MIDAZOLAM HCL 2 MG/2ML IJ SOLN
INTRAMUSCULAR | Status: AC
  Filled 2017-03-05: qty 2

## 2017-03-05 MED ORDER — DIPHENHYDRAMINE HCL 25 MG PO CAPS
25.0000 mg | ORAL_CAPSULE | Freq: Every evening | ORAL | Status: DC | PRN
  Administered 2017-03-05 (×2): 25 mg via ORAL
  Filled 2017-03-05 (×2): qty 1

## 2017-03-05 MED ORDER — AMIODARONE LOAD VIA INFUSION
150.0000 mg | Freq: Once | INTRAVENOUS | Status: DC
  Filled 2017-03-05: qty 83.34

## 2017-03-05 MED ORDER — LEVOTHYROXINE SODIUM 50 MCG PO TABS
50.0000 ug | ORAL_TABLET | Freq: Every day | ORAL | Status: DC
  Administered 2017-03-05 – 2017-03-06 (×2): 50 ug via ORAL
  Filled 2017-03-05 (×2): qty 1

## 2017-03-05 MED ORDER — MOMETASONE FURO-FORMOTEROL FUM 200-5 MCG/ACT IN AERO
2.0000 | INHALATION_SPRAY | Freq: Two times a day (BID) | RESPIRATORY_TRACT | Status: DC
  Administered 2017-03-05 – 2017-03-06 (×3): 2 via RESPIRATORY_TRACT
  Filled 2017-03-05: qty 8.8

## 2017-03-05 MED ORDER — SODIUM CHLORIDE 0.9 % IV SOLN: 250.0000 mL | INTRAVENOUS | Status: DC | PRN

## 2017-03-05 MED ORDER — ACETAMINOPHEN 325 MG PO TABS
650.0000 mg | ORAL_TABLET | Freq: Once | ORAL | Status: AC
  Administered 2017-03-05: 650 mg via ORAL
  Filled 2017-03-05: qty 2

## 2017-03-05 MED ORDER — SALINE SPRAY 0.65 % NA SOLN
1.0000 | NASAL | Status: DC | PRN
  Filled 2017-03-05: qty 44

## 2017-03-05 MED ORDER — ASPIRIN 81 MG PO CHEW
CHEWABLE_TABLET | ORAL | Status: AC
  Filled 2017-03-05: qty 1

## 2017-03-05 MED ORDER — SODIUM CHLORIDE 0.9% FLUSH
3.0000 mL | Freq: Two times a day (BID) | INTRAVENOUS | Status: DC
  Administered 2017-03-06 (×2): 3 mL via INTRAVENOUS

## 2017-03-05 MED ORDER — FLUOXETINE HCL 20 MG PO CAPS
20.0000 mg | ORAL_CAPSULE | Freq: Two times a day (BID) | ORAL | Status: DC
  Administered 2017-03-05 – 2017-03-06 (×3): 20 mg via ORAL
  Filled 2017-03-05 (×5): qty 1

## 2017-03-05 MED ORDER — PHENOL 1.4 % MT LIQD
1.0000 | OROMUCOSAL | Status: DC | PRN
  Administered 2017-03-06: 1 via OROMUCOSAL
  Filled 2017-03-05: qty 177

## 2017-03-05 MED ORDER — IOPAMIDOL (ISOVUE-300) INJECTION 61%
INTRAVENOUS | Status: DC | PRN
  Administered 2017-03-05: 150 mL via INTRA_ARTERIAL

## 2017-03-05 MED ORDER — ACETAMINOPHEN 650 MG RE SUPP: 650.0000 mg | Freq: Four times a day (QID) | RECTAL | Status: DC | PRN

## 2017-03-05 MED ORDER — OXYCODONE HCL 5 MG PO TABS: 5.0000 mg | ORAL_TABLET | ORAL | Status: DC | PRN

## 2017-03-05 MED ORDER — SODIUM CHLORIDE 0.9 % WEIGHT BASED INFUSION: 1.0000 mL/kg/h | INTRAVENOUS | Status: DC

## 2017-03-05 MED ORDER — SIMVASTATIN 20 MG PO TABS
40.0000 mg | ORAL_TABLET | Freq: Every day | ORAL | Status: DC
  Administered 2017-03-05: 40 mg via ORAL
  Filled 2017-03-05: qty 2

## 2017-03-05 MED ORDER — AMIODARONE HCL IN DEXTROSE 360-4.14 MG/200ML-% IV SOLN
60.0000 mg/h | INTRAVENOUS | Status: DC
  Filled 2017-03-05: qty 200

## 2017-03-05 MED ORDER — SIMVASTATIN 10 MG PO TABS
40.0000 mg | ORAL_TABLET | Freq: Every day | ORAL | Status: DC
  Filled 2017-03-05: qty 4

## 2017-03-05 MED ORDER — SODIUM CHLORIDE 0.9 % WEIGHT BASED INFUSION
1.0000 mL/kg/h | INTRAVENOUS | Status: AC
  Administered 2017-03-05: 1 mL/kg/h via INTRAVENOUS

## 2017-03-05 MED ORDER — ACETAMINOPHEN 325 MG PO TABS
650.0000 mg | ORAL_TABLET | Freq: Four times a day (QID) | ORAL | Status: DC | PRN
  Administered 2017-03-05: 650 mg via ORAL
  Filled 2017-03-05: qty 2

## 2017-03-05 MED ORDER — AMIODARONE HCL IN DEXTROSE 360-4.14 MG/200ML-% IV SOLN
60.0000 mg/h | INTRAVENOUS | Status: AC
  Administered 2017-03-05 (×2): 60 mg/h via INTRAVENOUS
  Filled 2017-03-05: qty 200

## 2017-03-05 MED ORDER — CARVEDILOL 3.125 MG PO TABS
3.1250 mg | ORAL_TABLET | Freq: Two times a day (BID) | ORAL | Status: DC
  Administered 2017-03-05 – 2017-03-06 (×2): 3.125 mg via ORAL
  Filled 2017-03-05 (×2): qty 1

## 2017-03-05 SURGICAL SUPPLY — 10 items
CATH 5FR JR4 DIAGNOSTIC (CATHETERS) ×2 IMPLANT
CATH INFINITI 5FR ANG PIGTAIL (CATHETERS) ×2 IMPLANT
CATH INFINITI 5FR JL4 (CATHETERS) ×2 IMPLANT
DEVICE CLOSURE MYNXGRIP 5F (Vascular Products) ×2 IMPLANT
KIT MANI 3VAL PERCEP (MISCELLANEOUS) ×2 IMPLANT
NEEDLE PERC 18GX7CM (NEEDLE) ×2 IMPLANT
PACK CARDIAC CATH (CUSTOM PROCEDURE TRAY) ×2 IMPLANT
SHEATH AVANTI 5FR X 11CM (SHEATH) ×4 IMPLANT
WIRE EMERALD 3MM-J .035X150CM (WIRE) ×2 IMPLANT
WIRE HITORQ VERSACORE ST 145CM (WIRE) ×2 IMPLANT

## 2017-03-05 NOTE — Progress Notes (Signed)
Dr. Saralyn Pilar at bedside speaking with pt. And 5 family members re: results. Discussing options for pt. At present. Pt. And family verbalizing understanding.

## 2017-03-05 NOTE — Care Management Obs Status (Signed)
[facility] NOTIFICATION   Patient Details  Name: Heather Peterson MRN: [id number] Date of Birth: [birth year]   Medicare Observation Status Notification Given:  Yes Notice signed, one given to patient and the other to HIM for scanning   Katrina Stack, RN 03/05/2017, 9:21 AM

## 2017-03-05 NOTE — Progress Notes (Signed)
[place] at [facility]                                                                                                                                                                                  Patient Demographics   Heather Peterson, is a 76 y.o. female, DOB - September 06, 1940, YQM:[id number]  Admit date - 03/04/2017   Admitting Physician Lance Coon, MD  Outpatient Primary MD for the patient is Feldpausch, Chrissie Noa, MD   LOS - 0  Subjective: Pt with defibrillator discharges, on amiodarone drip    Review of Systems:   CONSTITUTIONAL: No documented fever. No fatigue, weakness. No weight gain, no weight loss.  EYES: No blurry or double vision.  ENT: No tinnitus. No postnasal drip. No redness of the oropharynx.  RESPIRATORY: No cough, no wheeze, no hemoptysis. No dyspnea.  CARDIOVASCULAR: No chest pain. No orthopnea. No palpitations. No syncope.  GASTROINTESTINAL: No nausea, no vomiting or diarrhea. No abdominal pain. No melena or hematochezia.  GENITOURINARY: No dysuria or hematuria.  ENDOCRINE: No polyuria or nocturia. No heat or cold intolerance.  HEMATOLOGY: No anemia. No bruising. No bleeding.  INTEGUMENTARY: No rashes. No lesions.  MUSCULOSKELETAL: No arthritis. No swelling. No gout.  NEUROLOGIC: No numbness, tingling, or ataxia. No seizure-type activity. + syncope PSYCHIATRIC: No anxiety. No insomnia. No ADD.    Vitals:   Vitals:   03/05/17 1500 03/05/17 1515 03/05/17 1529 03/05/17 1549  BP: 110/64 108/64 111/61 (!) 113/53  Pulse: 70 70 70 70  Resp: 14 17 16 18   Temp:    97.8 F (36.6 C)  TempSrc:    Oral  SpO2: 96% 98% 99% 99%  Weight:      Height:        Wt Readings from Last 3 Encounters:  03/05/17 176 lb (79.8 kg)  02/05/17 177 lb 9.6 oz (80.6 kg)     Intake/Output Summary (Last 24 hours) at 03/05/17 1705 Last data filed at 03/05/17 1609  Gross per 24 hour  Intake           384.07 ml  Output              700 ml  Net           -315.93 ml    Physical Exam:   GENERAL: Pleasant-appearing in no apparent distress.  HEAD, EYES, EARS, NOSE AND THROAT: Atraumatic, normocephalic. Extraocular muscles are intact. Pupils equal and reactive to light. Sclerae anicteric. No conjunctival injection. No oro-pharyngeal erythema.  NECK: Supple. There is no jugular venous distention. No bruits, no lymphadenopathy, no thyromegaly.  HEART: Regular rate and rhythm,. No murmurs, no rubs, no clicks.  LUNGS: Clear to auscultation bilaterally. No rales or rhonchi. No wheezes.  ABDOMEN: Soft, flat, nontender, nondistended. Has good bowel sounds. No hepatosplenomegaly appreciated.  EXTREMITIES: No evidence of any cyanosis, clubbing, or peripheral edema.  +2 pedal and radial pulses bilaterally.  NEUROLOGIC: The patient is alert, awake, and oriented x3 with no focal motor or sensory deficits appreciated bilaterally.  SKIN: Moist and warm with no rashes appreciated.  Psych: Not anxious, depressed LN: No inguinal LN enlargement    Antibiotics   Anti-infectives    None      Medications   Scheduled Meds: . aspirin      . carvedilol  3.125 mg Oral BID  . clopidogrel  75 mg Oral Daily  . enoxaparin (LOVENOX) injection  40 mg Subcutaneous Q24H  . FLUoxetine  20 mg Oral BID  . levothyroxine  50 mcg Oral QAC breakfast  . losartan  50 mg Oral Daily  . mometasone-formoterol  2 puff Inhalation BID  . potassium chloride  40 mEq Oral Once  . simvastatin  40 mg Oral QHS  . [START ON 03/06/2017] sodium chloride flush  3 mL Intravenous Q12H   Continuous Infusions: . [START ON 03/06/2017] sodium chloride    . sodium chloride 1 mL/kg/hr (03/05/17 1606)  . amiodarone 30 mg/hr (03/05/17 0855)   PRN Meds:.[START ON 03/06/2017] sodium chloride, acetaminophen, diphenhydrAMINE, morphine injection, ondansetron **OR** ondansetron (ZOFRAN) IV, ondansetron (ZOFRAN) IV, oxyCODONE, [START ON 03/06/2017] sodium chloride flush   Data Review:   Micro  Results No results found for this or any previous visit (from the past 240 hour(s)).  Radiology Reports Dg Chest 2 View  Result Date: 02/04/2017 CLINICAL DATA:  Syncope EXAM: CHEST  2 VIEW COMPARISON:  None. FINDINGS: Left-sided duo lead pacing device. Mild cardiomegaly with aortic atherosclerosis. No consolidation or effusion. No pneumothorax. Degenerative changes of the spine. IMPRESSION: Mild to moderate cardiomegaly.  Negative for edema or infiltrate. Electronically Signed   By: Donavan Foil M.D.   On: 02/04/2017 21:49   Ct Head Wo Contrast  Result Date: 02/04/2017 CLINICAL DATA:  Seizure, syncope EXAM: CT HEAD WITHOUT CONTRAST TECHNIQUE: Contiguous axial images were obtained from the base of the skull through the vertex without intravenous contrast. COMPARISON:  None. FINDINGS: Brain: No acute territorial infarction, hemorrhage or intracranial mass is seen. 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 Mild ex vacuo dilatation of the right lateral ventricle. Mild to moderate small vessel ischemic changes of the white matter. Mild to moderate atrophy. Vascular: No hyperdense vessels. Left vertebral and bilateral carotid artery calcifications. Skull: No fracture or suspicious bone lesion Sinuses/Orbits: No acute finding. Other: None IMPRESSION: 1. No CT evidence for acute intracranial abnormality. 2. Old right frontal lobe infarct. Atrophy and small vessel ischemic changes of the white matter Electronically Signed   By: Donavan Foil M.D.   On: 02/04/2017 21:53   Dg Chest Port 1 View  Result Date: 03/05/2017 CLINICAL DATA:  Syncopal episode, defibrillator shocks. EXAM: PORTABLE CHEST 1 VIEW COMPARISON:  Chest radiograph February 04, 2017 FINDINGS: Cardiac silhouette is similarly enlarged. Calcified aortic knob. No pleural effusion or focal consolidation. No pneumothorax. LEFT defibrillator in situ. Pacer pad over LEFT chest. Multiple EKG lines overlie the patient and may obscure subtle  underlying pathology. Soft tissue planes and included osseous structures are unchanged. IMPRESSION: Stable cardiomegaly.  No acute pulmonary process. Aortic Atherosclerosis (ICD10-I70.0). Electronically Signed   By: Elon Alas M.D.   On: 03/05/2017 00:19     CBC  Recent Labs Lab 03/04/17 2247 03/05/17 0541  WBC 11.7* 7.1  HGB 10.9* 9.9*  HCT 32.6* 30.1*  PLT 248 193  MCV 87.8 87.3  MCH 29.2 28.7  MCHC 33.3 32.9  RDW 18.3* 18.2*  LYMPHSABS 3.9*  --   MONOABS 1.2*  --   EOSABS 0.1  --   BASOSABS 0.0  --     Chemistries   Recent Labs Lab 03/04/17 2247 03/05/17 0541  NA 139 139  K 3.5 3.2*  CL 101 102  CO2 24 27  GLUCOSE 180* 109*  BUN 23* 20  CREATININE 1.32* 1.11*  CALCIUM 9.6 8.7*  MG  --  2.6*  AST 23  --   ALT 12*  --   ALKPHOS 73  --   BILITOT 0.8  --    ------------------------------------------------------------------------------------------------------------------ estimated creatinine clearance is 44.7 mL/min (A) (by C-G formula based on SCr of 1.11 mg/dL (H)). ------------------------------------------------------------------------------------------------------------------ No results for input(s): HGBA1C in the last 72 hours. ------------------------------------------------------------------------------------------------------------------ No results for input(s): CHOL, HDL, LDLCALC, TRIG, CHOLHDL, LDLDIRECT in the last 72 hours. ------------------------------------------------------------------------------------------------------------------ No results for input(s): TSH, T4TOTAL, T3FREE, THYROIDAB in the last 72 hours.  Invalid input(s): FREET3 ------------------------------------------------------------------------------------------------------------------ No results for input(s): VITAMINB12, FOLATE, FERRITIN, TIBC, IRON, RETICCTPCT in the last 72 hours.  Coagulation profile No results for input(s): INR, PROTIME in the last 168 hours.  No  results for input(s): DDIMER in the last 72 hours.  Cardiac Enzymes  Recent Labs Lab 03/04/17 2247 03/05/17 0541 03/05/17 1039  TROPONINI <0.03 0.30* 0.25*   ------------------------------------------------------------------------------------------------------------------ Invalid input(s): POCBNP    Assessment & Plan  Pt presents with syncope  *Defibrillator discharge -continue amiodrone drip cardiology to see  * hypokalemia replace K+  * CAD (coronary artery disease) - continue home meds, CARDS EVAL PENDING  *  Acute on chronic combined systolic and diastolic CHF (congestive heart failure) ([place]) - CONTINUE patient's home regimen  *  HTN (hypertension) - continue home meds   * Diabetes ([place]) - sliding scale insulin with corresponding glucose checks      Code Status Orders        Start     Ordered   03/05/17 0123  Full code  Continuous     03/05/17 0122    Code Status History    Date Active Date Inactive Code Status Order ID Comments User Context   02/04/2017 11:40 PM 02/05/2017  9:31 PM Full Code [id number]  Lance Coon, MD ED    Advance Directive Documentation     Most Recent Value  Type of Advance Directive  Living will  Pre-existing out of facility DNR order (yellow form or pink MOST form)  -  "MOST" Form in Place?  -           Consults cardiology   DVT Prophylaxis Lovenox Lab Results  Component Value Date   PLT 193 03/05/2017     Time Spent in minutes 35 minutes   Greater than 50% of time spent in care coordination and counseling patient regarding the condition and plan of care.   Dustin Flock M.D on 03/05/2017 at 5:05 PM  Between 7am to 6pm - Pager - [phone]  After 6pm go to [email or web address] - password [email or web address] [facility]   Office  [phone]

## 2017-03-05 NOTE — Progress Notes (Signed)
Troponin reported at 0.30. MD paged but did not reply at this time. Troponin level given to oncoming nurse.

## 2017-03-05 NOTE — ED Notes (Signed)
Family at bedside. 

## 2017-03-05 NOTE — ED Notes (Signed)
Magda Paganini from [facility] called and stated that pt has received 7 shocks tonight beginning at 2200 and the last one at 2237. All were for VT and all were successful.

## 2017-03-05 NOTE — Progress Notes (Signed)
Pt arrived from ED alert and oriented. Family at bedside., No c/o pain, no SOB. Telemetry and skin verified. No concerns offered from pt on admission. Pt has refused the bed alarm because the light is distracting. Pt educated on risks and benefits. Amiodarone drip started at 0223. VSS.

## 2017-03-05 NOTE — Progress Notes (Signed)
Pt off the floor for cardiac cath.

## 2017-03-05 NOTE — Progress Notes (Addendum)
Pt back from cardiac cath. VSS. No bleeding.No complaints of pain. Amiodarone gtt infusing.will continue to monitor.

## 2017-03-05 NOTE — Consult Note (Signed)
[facility] Cardiology  CARDIOLOGY CONSULT NOTE  Patient ID: Heather Peterson MRN: [id number] DOB/AGE: [birth year] 75 y.o.  Admit date: 03/04/2017 Referring Physician Posey Pronto Primary Physician [place] Primary Cardiologist Jaice Digioia Reason for Consultation ventriculartachycardia/ventricular fibrillation  HPI: 76 year old female referred for ventricular tachycardia, ventricular fibrillation. The patient has known coronary artery disease, status post MI 09/1997, status post coronary stent proximal left circumflex 11/05/2011, status post MI and cardiac arrest with Promus stent LAD 04/24/2014. The patient has known ischemic cardiomyopathy, LVEF 35-40% by 2-D echocardiogram 02/05/2017 with chronic systolic congestive heart failure. The patient had an episode of ventricular tachycardia/ventricular fibrillation 02/04/2017 at which time she received an ICD shock. The patient is on amiodarone 200 mg daily. She was doing well until last evening, when she had multiple syncopal episodes with several ICD delivered shocks. Interrogation revealed 13 episodes of ventricular tachycardia and ventricular fibrillation, one episode of DVT required further delivered shocks, and an episode of ventricular fibrillation requiring 3 delivered shocks. The patient was started on amiodarone bolus and drip has had no recurrent episodes.initial troponin was less than 0.03. Follow-up troponin was 0.30. The patient denies chest pain or worsening shortness of breath.  Review of systems complete and found to be negative unless listed above     Past Medical History:  Diagnosis Date  . Chronic combined systolic and diastolic CHF (congestive heart failure) ([place])   . Diabetes mellitus without complication ([place])   . Hypertension   . MI (myocardial infarction) ([place])    x 5  . Pacemaker     Past Surgical History:  Procedure Laterality Date  . pacemaker/defibrillator Left     Prescriptions Prior to Admission  Medication Sig Dispense Refill  Last Dose  . acetaminophen (TYLENOL) 500 MG tablet Take 500 mg by mouth at bedtime.   03/03/2017 at Unknown time  . albuterol (PROVENTIL HFA;VENTOLIN HFA) 108 (90 Base) MCG/ACT inhaler Inhale 2 puffs into the lungs every 4 (four) hours as needed for wheezing or shortness of breath. 1 Inhaler 1 prn at prn  . amiodarone (PACERONE) 200 MG tablet Take 1 tablet (200 mg total) by mouth 2 (two) times daily. Amiodarone 200 mg twice a day for one week followed by 200 mg once daily (Patient taking differently: Take 200 mg by mouth daily. ) 40 tablet 0 03/04/2017 at Unknown time  . carvedilol (COREG) 3.125 MG tablet Take 3.125 mg by mouth 2 (two) times daily.  6 03/04/2017 at 1000  . clopidogrel (PLAVIX) 75 MG tablet Take 75 mg by mouth daily.  2 03/04/2017 at Unknown time  . FLUoxetine (PROZAC) 20 MG capsule Take 20 mg by mouth 2 (two) times daily.  0 03/04/2017 at Unknown time  . Fluticasone-Salmeterol (ADVAIR DISKUS) 250-50 MCG/DOSE AEPB Inhale 1 puff into the lungs 2 (two) times daily. 1 each 1 03/04/2017 at Unknown time  . levothyroxine (SYNTHROID, LEVOTHROID) 50 MCG tablet Take 50 mcg by mouth daily.  3 03/04/2017 at Unknown time  . losartan (COZAAR) 50 MG tablet Take 50 mg by mouth daily.   03/04/2017 at Unknown time  . metFORMIN (GLUCOPHAGE-XR) 750 MG 24 hr tablet Take 375 mg by mouth 2 (two) times daily.   2 03/04/2017 at Unknown time  . Multiple Vitamin (MULTIVITAMIN WITH MINERALS) TABS tablet Take 1 tablet by mouth daily.   03/04/2017 at Unknown time  . niacin 500 MG CR capsule Take 500 mg by mouth 2 (two) times daily with a meal.   03/04/2017 at Unknown time  . nitroGLYCERIN (NITROSTAT) 0.4  MG SL tablet Place 0.4 mg under the tongue every 5 (five) minutes as needed for chest pain.   prn at prn  . omega-3 acid ethyl esters (LOVAZA) 1 g capsule Take 1 g by mouth daily.    03/04/2017 at Unknown time  . pioglitazone (ACTOS) 15 MG tablet Take 15 mg by mouth daily.  3 03/04/2017 at Unknown time  .  simvastatin (ZOCOR) 40 MG tablet Take 40 mg by mouth at bedtime.   2 03/03/2017 at Unknown time   Social History   Social History  . Marital status: Married    Spouse name: N/A  . Number of children: N/A  . Years of education: N/A   Occupational History  . Not on file.   Social History Main Topics  . Smoking status: Never Smoker  . Smokeless tobacco: Never Used  . Alcohol use Yes     Comment: occasionally  . Drug use: No  . Sexual activity: Not on file   Other Topics Concern  . Not on file   Social History Narrative  . No narrative on file    Family History  Problem Relation Age of Onset  . Hypertension Father   . Heart attack Father       Review of systems complete and found to be negative unless listed above      PHYSICAL EXAM  General: Well developed, well nourished, in no acute distress HEENT:  Normocephalic and atramatic Neck:  No JVD.  Lungs: Clear bilaterally to auscultation and percussion. Heart: HRRR . Normal S1 and S2 without gallops or murmurs.  Abdomen: Bowel sounds are positive, abdomen soft and non-tender  Msk:  Back normal, normal gait. Normal strength and tone for age. Extremities: No clubbing, cyanosis or edema.   Neuro: Alert and oriented X 3. Psych:  Good affect, responds appropriately  Labs:   Lab Results  Component Value Date   WBC 7.1 03/05/2017   HGB 9.9 (L) 03/05/2017   HCT 30.1 (L) 03/05/2017   MCV 87.3 03/05/2017   PLT 193 03/05/2017    Recent Labs Lab 03/04/17 2247 03/05/17 0541  NA 139 139  K 3.5 3.2*  CL 101 102  CO2 24 27  BUN 23* 20  CREATININE 1.32* 1.11*  CALCIUM 9.6 8.7*  PROT 7.9  --   BILITOT 0.8  --   ALKPHOS 73  --   ALT 12*  --   AST 23  --   GLUCOSE 180* 109*   Lab Results  Component Value Date   TROPONINI 0.30 ([place]) 03/05/2017   No results found for: CHOL No results found for: HDL No results found for: LDLCALC No results found for: TRIG No results found for: CHOLHDL No results found for:  LDLDIRECT    Radiology: Dg Chest 2 View  Result Date: 02/04/2017 CLINICAL DATA:  Syncope EXAM: CHEST  2 VIEW COMPARISON:  None. FINDINGS: Left-sided duo lead pacing device. Mild cardiomegaly with aortic atherosclerosis. No consolidation or effusion. No pneumothorax. Degenerative changes of the spine. IMPRESSION: Mild to moderate cardiomegaly.  Negative for edema or infiltrate. Electronically Signed   By: Donavan Foil M.D.   On: 02/04/2017 21:49   Ct Head Wo Contrast  Result Date: 02/04/2017 CLINICAL DATA:  Seizure, syncope EXAM: CT HEAD WITHOUT CONTRAST TECHNIQUE: Contiguous axial images were obtained from the base of the skull through the vertex without intravenous contrast. COMPARISON:  None. FINDINGS: Brain: No acute territorial infarction, hemorrhage or intracranial mass is seen. Old right frontal and  anterior temporal lobe infarct. Mild ex vacuo dilatation of the right lateral ventricle. Mild to moderate small vessel ischemic changes of the white matter. Mild to moderate atrophy. Vascular: No hyperdense vessels. Left vertebral and bilateral carotid artery calcifications. Skull: No fracture or suspicious bone lesion Sinuses/Orbits: No acute finding. Other: None IMPRESSION: 1. No CT evidence for acute intracranial abnormality. 2. Old right frontal lobe infarct. Atrophy and small vessel ischemic changes of the white matter Electronically Signed   By: Donavan Foil M.D.   On: 02/04/2017 21:53   Dg Chest Port 1 View  Result Date: 03/05/2017 CLINICAL DATA:  Syncopal episode, defibrillator shocks. EXAM: PORTABLE CHEST 1 VIEW COMPARISON:  Chest radiograph February 04, 2017 FINDINGS: Cardiac silhouette is similarly enlarged. Calcified aortic knob. No pleural effusion or focal consolidation. No pneumothorax. LEFT defibrillator in situ. Pacer pad over LEFT chest. Multiple EKG lines overlie the patient and may obscure subtle underlying pathology. Soft tissue planes and included osseous structures are  unchanged. IMPRESSION: Stable cardiomegaly.  No acute pulmonary process. Aortic Atherosclerosis (ICD10-I70.0). Electronically Signed   By: Elon Alas M.D.   On: 03/05/2017 00:19    EKG: a paced V sensed without acute ischemic ST-T wave changes  ASSESSMENT AND PLAN:   1.or recurrent ventricular tachycardia/ventricular fibrillation episodes requiring multiple ICD delivered shocks, stabilized on amiodarone drip 2. Known CAD, status post coronary stents, minimal elevation in troponin 3. Ischemic cardiomyopathy, known LV EF 35-40%  Recommendations  1. Continue amiodarone drip 2. Proceed with coronary angiography. The risks, benefits alternatives to cardiac catheterization were explained to the patient in the form and consent was obtained 3. Consult Dr. Virl Axe for refractory ventricular tachycardia/ventricular fibrillation  Signed: Isaias Cowman MD,PhD, [facility] 03/05/2017, 8:18 AM

## 2017-03-05 NOTE — ED Notes (Signed)
Defibrillator interrogation complete. 

## 2017-03-06 ENCOUNTER — Encounter: Payer: Self-pay | Admitting: Cardiology

## 2017-03-06 ENCOUNTER — Inpatient Hospital Stay: Payer: Medicare Other

## 2017-03-06 ENCOUNTER — Other Ambulatory Visit: Payer: Self-pay

## 2017-03-06 ENCOUNTER — Inpatient Hospital Stay (HOSPITAL_COMMUNITY)
Admission: AD | Admit: 2017-03-06 | Discharge: 2017-03-14 | DRG: 286 | Disposition: A | Discharge status: Home / Self Care | Payer: Medicare (Managed Care) | Source: Other Acute Inpatient Hospital | Attending: Internal Medicine | Admitting: Internal Medicine

## 2017-03-06 DIAGNOSIS — I110 Hypertensive heart disease with heart failure: Secondary | ICD-10-CM | POA: Diagnosis present

## 2017-03-06 DIAGNOSIS — Z7951 Long term (current) use of inhaled steroids: Secondary | ICD-10-CM | POA: Diagnosis not present

## 2017-03-06 DIAGNOSIS — R55 Syncope and collapse: Secondary | ICD-10-CM

## 2017-03-06 DIAGNOSIS — R9431 Abnormal electrocardiogram [ECG] [EKG]: Secondary | ICD-10-CM

## 2017-03-06 DIAGNOSIS — Z95810 Presence of automatic (implantable) cardiac defibrillator: Secondary | ICD-10-CM

## 2017-03-06 DIAGNOSIS — J449 Chronic obstructive pulmonary disease, unspecified: Secondary | ICD-10-CM | POA: Diagnosis present

## 2017-03-06 DIAGNOSIS — E876 Hypokalemia: Secondary | ICD-10-CM | POA: Diagnosis present

## 2017-03-06 DIAGNOSIS — I252 Old myocardial infarction: Secondary | ICD-10-CM

## 2017-03-06 DIAGNOSIS — Z9981 Dependence on supplemental oxygen: Secondary | ICD-10-CM

## 2017-03-06 DIAGNOSIS — Z8249 Family history of ischemic heart disease and other diseases of the circulatory system: Secondary | ICD-10-CM

## 2017-03-06 DIAGNOSIS — Z79899 Other long term (current) drug therapy: Secondary | ICD-10-CM | POA: Diagnosis not present

## 2017-03-06 DIAGNOSIS — I5042 Chronic combined systolic (congestive) and diastolic (congestive) heart failure: Secondary | ICD-10-CM | POA: Diagnosis not present

## 2017-03-06 DIAGNOSIS — Z8674 Personal history of sudden cardiac arrest: Secondary | ICD-10-CM

## 2017-03-06 DIAGNOSIS — Z9861 Coronary angioplasty status: Secondary | ICD-10-CM | POA: Diagnosis not present

## 2017-03-06 DIAGNOSIS — I472 Ventricular tachycardia, unspecified: Secondary | ICD-10-CM

## 2017-03-06 DIAGNOSIS — I5043 Acute on chronic combined systolic (congestive) and diastolic (congestive) heart failure: Secondary | ICD-10-CM | POA: Diagnosis present

## 2017-03-06 DIAGNOSIS — I2510 Atherosclerotic heart disease of native coronary artery without angina pectoris: Secondary | ICD-10-CM | POA: Diagnosis present

## 2017-03-06 DIAGNOSIS — E785 Hyperlipidemia, unspecified: Secondary | ICD-10-CM | POA: Diagnosis present

## 2017-03-06 DIAGNOSIS — I4721 Torsades de pointes: Secondary | ICD-10-CM

## 2017-03-06 DIAGNOSIS — I255 Ischemic cardiomyopathy: Secondary | ICD-10-CM | POA: Diagnosis present

## 2017-03-06 DIAGNOSIS — T50905A Adverse effect of unspecified drugs, medicaments and biological substances, initial encounter: Secondary | ICD-10-CM

## 2017-03-06 DIAGNOSIS — E119 Type 2 diabetes mellitus without complications: Secondary | ICD-10-CM | POA: Diagnosis present

## 2017-03-06 DIAGNOSIS — Z4502 Encounter for adjustment and management of automatic implantable cardiac defibrillator: Secondary | ICD-10-CM | POA: Diagnosis not present

## 2017-03-06 DIAGNOSIS — I5022 Chronic systolic (congestive) heart failure: Secondary | ICD-10-CM | POA: Diagnosis present

## 2017-03-06 DIAGNOSIS — I4901 Ventricular fibrillation: Secondary | ICD-10-CM | POA: Diagnosis present

## 2017-03-06 DIAGNOSIS — I10 Essential (primary) hypertension: Secondary | ICD-10-CM | POA: Diagnosis present

## 2017-03-06 DIAGNOSIS — Z23 Encounter for immunization: Secondary | ICD-10-CM

## 2017-03-06 DIAGNOSIS — I25110 Atherosclerotic heart disease of native coronary artery with unstable angina pectoris: Secondary | ICD-10-CM | POA: Diagnosis not present

## 2017-03-06 DIAGNOSIS — Z79890 Hormone replacement therapy (postmenopausal): Secondary | ICD-10-CM

## 2017-03-06 LAB — GLUCOSE, CAPILLARY
Glucose-Capillary: 191 mg/dL — ABNORMAL HIGH (ref 65–99)
Glucose-Capillary: 126 mg/dL — ABNORMAL HIGH (ref 65–99)
Glucose-Capillary: 141 mg/dL — ABNORMAL HIGH (ref 65–99)

## 2017-03-06 LAB — BASIC METABOLIC PANEL
Anion gap: 10 (ref 5–15)
BUN: 17 mg/dL (ref 6–20)
CO2: 27 mmol/L (ref 22–32)
Calcium: 8.6 mg/dL — ABNORMAL LOW (ref 8.9–10.3)
Chloride: 103 mmol/L (ref 101–111)
Creatinine, Ser: 1.06 mg/dL — ABNORMAL HIGH (ref 0.44–1.00)
GFR calc Af Amer: 58 mL/min — ABNORMAL LOW (ref >60)
GFR calc non Af Amer: 50 mL/min — ABNORMAL LOW (ref >60)
Glucose, Bld: 130 mg/dL — ABNORMAL HIGH (ref 65–99)
Potassium: 3.3 mmol/L — ABNORMAL LOW (ref 3.5–5.1)
Sodium: 140 mmol/L (ref 135–145)

## 2017-03-06 LAB — CBC
HCT: 30.8 % — ABNORMAL LOW (ref 36.0–46.0)
Hemoglobin: 9.8 g/dL — ABNORMAL LOW (ref 12.0–15.0)
MCH: 28.6 pg (ref 26.0–34.0)
MCHC: 31.8 g/dL (ref 30.0–36.0)
MCV: 89.8 fL (ref 78.0–100.0)
Platelets: 204 10*3/uL (ref 150–400)
RBC: 3.43 MIL/uL — ABNORMAL LOW (ref 3.87–5.11)
RDW: 17.7 % — ABNORMAL HIGH (ref 11.5–15.5)
WBC: 9 10*3/uL (ref 4.0–10.5)

## 2017-03-06 LAB — MRSA PCR SCREENING: MRSA by PCR: NEGATIVE

## 2017-03-06 LAB — CREATININE, SERUM
Creatinine, Ser: 1 mg/dL (ref 0.44–1.00)
GFR calc Af Amer: >60 mL/min (ref >60)
GFR calc non Af Amer: 54 mL/min — ABNORMAL LOW (ref >60)

## 2017-03-06 MED ORDER — AMIODARONE HCL IN DEXTROSE 360-4.14 MG/200ML-% IV SOLN: 60.0000 mg/h | INTRAVENOUS | Status: DC

## 2017-03-06 MED ORDER — INSULIN ASPART 100 UNIT/ML ~~LOC~~ SOLN
0.0000 [IU] | Freq: Three times a day (TID) | SUBCUTANEOUS | Status: DC
  Administered 2017-03-08: 3 [IU] via SUBCUTANEOUS
  Administered 2017-03-09: 2 [IU] via SUBCUTANEOUS
  Administered 2017-03-10: 3 [IU] via SUBCUTANEOUS
  Administered 2017-03-12 (×2): 2 [IU] via SUBCUTANEOUS

## 2017-03-06 MED ORDER — ORAL CARE MOUTH RINSE
15.0000 mL | Freq: Two times a day (BID) | OROMUCOSAL | Status: DC
Start: 2017-03-06 — End: 2017-03-14
  Administered 2017-03-06 – 2017-03-13 (×9): 15 mL via OROMUCOSAL

## 2017-03-06 MED ORDER — IPRATROPIUM-ALBUTEROL 0.5-2.5 (3) MG/3ML IN SOLN
RESPIRATORY_TRACT | Status: AC
  Administered 2017-03-06: 3 mL
  Filled 2017-03-06: qty 3

## 2017-03-06 MED ORDER — LORAZEPAM 0.5 MG PO TABS
0.5000 mg | ORAL_TABLET | Freq: Four times a day (QID) | ORAL | Status: DC | PRN
Start: 2017-03-06 — End: 2017-03-14
  Administered 2017-03-06 – 2017-03-11 (×9): 0.5 mg via ORAL
  Filled 2017-03-06 (×10): qty 1

## 2017-03-06 MED ORDER — NITROGLYCERIN 0.4 MG SL SUBL: 0.4000 mg | SUBLINGUAL_TABLET | SUBLINGUAL | Status: DC | PRN

## 2017-03-06 MED ORDER — NITROGLYCERIN 2 % TD OINT
1.0000 [in_us] | TOPICAL_OINTMENT | Freq: Four times a day (QID) | TRANSDERMAL | Status: DC
Start: 2017-03-06 — End: 2017-03-06

## 2017-03-06 MED ORDER — POTASSIUM CHLORIDE CRYS ER 20 MEQ PO TBCR
40.0000 meq | EXTENDED_RELEASE_TABLET | ORAL | Status: AC
  Administered 2017-03-06 (×2): 40 meq via ORAL
  Filled 2017-03-06 (×2): qty 2

## 2017-03-06 MED ORDER — LIDOCAINE IN D5W 4-5 MG/ML-% IV SOLN
1.0000 mg/min | INTRAVENOUS | Status: DC
  Administered 2017-03-08 – 2017-03-09 (×2): 1 mg/min via INTRAVENOUS
  Filled 2017-03-06 (×2): qty 500

## 2017-03-06 MED ORDER — NITROGLYCERIN 2 % TD OINT
1.0000 [in_us] | TOPICAL_OINTMENT | Freq: Four times a day (QID) | TRANSDERMAL | Status: DC
  Administered 2017-03-06: 1 [in_us] via TOPICAL
  Filled 2017-03-06: qty 1

## 2017-03-06 MED ORDER — INSULIN ASPART 100 UNIT/ML ~~LOC~~ SOLN: 0.0000 [IU] | Freq: Every day | SUBCUTANEOUS | Status: DC

## 2017-03-06 MED ORDER — AMIODARONE HCL IN DEXTROSE 360-4.14 MG/200ML-% IV SOLN
60.0000 mg/h | INTRAVENOUS | Status: AC
  Administered 2017-03-06: 60 mg/h via INTRAVENOUS
  Filled 2017-03-06: qty 200

## 2017-03-06 MED ORDER — FUROSEMIDE 10 MG/ML IJ SOLN
20.0000 mg | Freq: Two times a day (BID) | INTRAMUSCULAR | Status: DC
Start: 2017-03-06 — End: 2017-03-06
  Administered 2017-03-06: 20 mg via INTRAVENOUS
  Filled 2017-03-06: qty 2

## 2017-03-06 MED ORDER — AMIODARONE HCL IN DEXTROSE 360-4.14 MG/200ML-% IV SOLN
60.0000 mg/h | INTRAVENOUS | Status: DC
  Administered 2017-03-06 (×2): 60 mg/h via INTRAVENOUS
  Filled 2017-03-06: qty 200

## 2017-03-06 MED ORDER — HEPARIN SODIUM (PORCINE) 5000 UNIT/ML IJ SOLN
5000.0000 [IU] | Freq: Three times a day (TID) | INTRAMUSCULAR | Status: DC
  Administered 2017-03-06 – 2017-03-11 (×14): 5000 [IU] via SUBCUTANEOUS
  Filled 2017-03-06 (×14): qty 1

## 2017-03-06 MED ORDER — RANOLAZINE ER 500 MG PO TB12
500.0000 mg | ORAL_TABLET | Freq: Two times a day (BID) | ORAL | Status: DC
  Administered 2017-03-06 – 2017-03-14 (×16): 500 mg via ORAL
  Filled 2017-03-06 (×16): qty 1

## 2017-03-06 MED ORDER — RANOLAZINE ER 500 MG PO TB12
500.0000 mg | ORAL_TABLET | Freq: Two times a day (BID) | ORAL | Status: DC
  Administered 2017-03-06: 500 mg via ORAL
  Filled 2017-03-06 (×2): qty 1

## 2017-03-06 MED ORDER — ACETAMINOPHEN 500 MG PO TABS
500.0000 mg | ORAL_TABLET | Freq: Every day | ORAL | Status: DC
  Administered 2017-03-06 – 2017-03-13 (×8): 500 mg via ORAL
  Filled 2017-03-06 (×8): qty 1

## 2017-03-06 MED ORDER — IPRATROPIUM-ALBUTEROL 0.5-2.5 (3) MG/3ML IN SOLN: 3.0000 mL | RESPIRATORY_TRACT | Status: DC | PRN

## 2017-03-06 MED ORDER — AMIODARONE HCL IN DEXTROSE 360-4.14 MG/200ML-% IV SOLN
30.0000 mg/h | INTRAVENOUS | Status: DC
  Administered 2017-03-07 – 2017-03-08 (×3): 30 mg/h via INTRAVENOUS
  Filled 2017-03-06 (×3): qty 200

## 2017-03-06 MED ORDER — IPRATROPIUM BROMIDE 0.02 % IN SOLN: 0.2500 mg | RESPIRATORY_TRACT | Status: DC | PRN

## 2017-03-06 MED ORDER — CEFUROXIME AXETIL 500 MG PO TABS
500.0000 mg | ORAL_TABLET | Freq: Two times a day (BID) | ORAL | Status: DC
  Administered 2017-03-06: 500 mg via ORAL
  Filled 2017-03-06: qty 1

## 2017-03-06 MED ORDER — ATORVASTATIN CALCIUM 20 MG PO TABS: 80.0000 mg | ORAL_TABLET | Freq: Every day | ORAL | Status: DC

## 2017-03-06 MED ORDER — LEVOTHYROXINE SODIUM 50 MCG PO TABS
50.0000 ug | ORAL_TABLET | Freq: Every day | ORAL | Status: DC
  Administered 2017-03-07 – 2017-03-14 (×8): 50 ug via ORAL
  Filled 2017-03-06 (×7): qty 1

## 2017-03-06 MED ORDER — LIDOCAINE IN D5W 4-5 MG/ML-% IV SOLN
1.0000 mg/min | INTRAVENOUS | Status: DC
  Administered 2017-03-06: 1 mg/min via INTRAVENOUS
  Filled 2017-03-06: qty 500

## 2017-03-06 MED ORDER — SIMVASTATIN 40 MG PO TABS
40.0000 mg | ORAL_TABLET | Freq: Every day | ORAL | Status: DC
  Administered 2017-03-06 – 2017-03-08 (×3): 40 mg via ORAL
  Filled 2017-03-06 (×3): qty 1

## 2017-03-06 MED ORDER — INFLUENZA VAC SPLIT HIGH-DOSE 0.5 ML IM SUSY
0.5000 mL | PREFILLED_SYRINGE | INTRAMUSCULAR | Status: DC
  Filled 2017-03-06: qty 0.5

## 2017-03-06 MED ORDER — ASPIRIN 81 MG PO CHEW
81.0000 mg | CHEWABLE_TABLET | Freq: Every day | ORAL | Status: DC
  Administered 2017-03-06: 81 mg via ORAL
  Filled 2017-03-06: qty 1

## 2017-03-06 MED ORDER — ASPIRIN 81 MG PO CHEW
81.0000 mg | CHEWABLE_TABLET | Freq: Every day | ORAL | Status: DC
  Administered 2017-03-07 – 2017-03-14 (×8): 81 mg via ORAL
  Filled 2017-03-06 (×7): qty 1

## 2017-03-06 MED ORDER — SALINE SPRAY 0.65 % NA SOLN
1.0000 | NASAL | Status: DC | PRN
  Administered 2017-03-07: 1 via NASAL
  Filled 2017-03-06 (×2): qty 44

## 2017-03-06 MED ORDER — CARVEDILOL 3.125 MG PO TABS
3.1250 mg | ORAL_TABLET | Freq: Two times a day (BID) | ORAL | Status: DC
  Administered 2017-03-06 – 2017-03-14 (×16): 3.125 mg via ORAL
  Filled 2017-03-06 (×17): qty 1

## 2017-03-06 MED ORDER — LIDOCAINE BOLUS VIA INFUSION
75.0000 mg | Freq: Once | INTRAVENOUS | Status: AC
  Administered 2017-03-06: 75 mg via INTRAVENOUS
  Filled 2017-03-06: qty 76

## 2017-03-06 MED ORDER — AMIODARONE IV BOLUS ONLY 150 MG/100ML
150.0000 mg | Freq: Once | INTRAVENOUS | Status: AC
  Administered 2017-03-06: 150 mg via INTRAVENOUS
  Filled 2017-03-06: qty 100

## 2017-03-06 MED ORDER — RANOLAZINE ER 500 MG PO TB12
500.0000 mg | ORAL_TABLET | Freq: Two times a day (BID) | ORAL | Status: DC
  Filled 2017-03-06: qty 1

## 2017-03-06 NOTE — Discharge Summary (Signed)
[place] at [facility], 76 y.o., DOB Sep 08, 1940, MRN [id number]. 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 female who presents with multiple syncopal episodes tonight followed by firing of her defibrillator. This happened a total of 5 times today. She states that it happened 3 times at home where she would get lightheaded, experienced syncope, and then be shocked by her defibrillator. She would then wake up. This happened witnessed by EMS to times in the ambulance. Patient was hospitalized and had 2 episodes again of having ventricular tachycardia with the defibrillator discharging. She was started on amiodarone drip despite that she continued to have this. She underwent cardiac catheter according to cardiology she has three-vessel disease. They recommended either CABG versus medical management. Patient continued to have episodes of ventricular tachycardia she was seen in consultation by EP cardiology who recommended starting patient on lidocaine drip and transferred to [facility] cardiac stepdown unit.            Consults  cardiology  Significant Tests:  See full reports for all details     Dg Chest 2 View  Result Date: 02/04/2017 CLINICAL DATA:  Syncope EXAM: CHEST  2 VIEW COMPARISON:  None. FINDINGS: Left-sided duo lead pacing device. Mild cardiomegaly with aortic atherosclerosis. No consolidation or effusion. No pneumothorax. Degenerative changes of the spine. IMPRESSION: Mild to  moderate cardiomegaly.  Negative for edema or infiltrate. Electronically Signed   By: Donavan Foil M.D.   On: 02/04/2017 21:49   Ct Head Wo Contrast  Result Date: 02/04/2017 CLINICAL DATA:  Seizure, syncope EXAM: CT HEAD WITHOUT CONTRAST TECHNIQUE: Contiguous axial images were obtained from the base of the skull through the vertex without intravenous contrast. COMPARISON:  None. FINDINGS: Brain: No acute territorial infarction, hemorrhage or intracranial mass is seen. 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 Mild ex vacuo dilatation of the right lateral ventricle. Mild to moderate small vessel ischemic changes of the white matter. Mild to moderate atrophy. Vascular: No hyperdense vessels. Left vertebral and bilateral carotid artery calcifications. Skull: No fracture or suspicious bone lesion Sinuses/Orbits: No acute finding. Other: None IMPRESSION: 1. No CT evidence for acute intracranial abnormality. 2. Old right frontal lobe infarct. Atrophy and small vessel ischemic changes of the white matter Electronically Signed   By: Donavan Foil M.D.   On: 02/04/2017 21:53   Dg Chest Port 1 View  Result Date: 03/06/2017 CLINICAL DATA:  Shortness of Breath EXAM: PORTABLE CHEST 1 VIEW COMPARISON:  March 04, 2017 FINDINGS: There is interstitial edema bilaterally, primarily in the mid and lower lung zones. There is no airspace consolidation. There is cardiomegaly with pulmonary venous hypertension. Pacemaker leads are attached to the right atrium and right ventricle. There is aortic atherosclerosis. There is calcification in the left carotid artery. There is also calcification in the left anterior descending coronary artery. No adenopathy.  No evident bone lesions. IMPRESSION: Cardiomegaly with pulmonary venous hypertension consistent with pulmonary vascular congestion. There is no a degree of interstitial edema. No airspace  consolidation. Pacemaker leads are attached to the right atrium and right  ventricle. Aortic atherosclerosis. Calcification in left carotid artery. Coronary artery calcification noted. Aortic Atherosclerosis (ICD10-I70.0). Electronically Signed   By: Lowella Grip III M.D.   On: 03/06/2017 10:01   Dg Chest Port 1 View  Result Date: 03/05/2017 CLINICAL DATA:  Syncopal episode, defibrillator shocks. EXAM: PORTABLE CHEST 1 VIEW COMPARISON:  Chest radiograph February 04, 2017 FINDINGS: Cardiac silhouette is similarly enlarged. Calcified aortic knob. No pleural effusion or focal consolidation. No pneumothorax. LEFT defibrillator in situ. Pacer pad over LEFT chest. Multiple EKG lines overlie the patient and may obscure subtle underlying pathology. Soft tissue planes and included osseous structures are unchanged. IMPRESSION: Stable cardiomegaly.  No acute pulmonary process. Aortic Atherosclerosis (ICD10-I70.0). Electronically Signed   By: Elon Alas M.D.   On: 03/05/2017 00:19       Today   Subjective:   Heather Peterson  complains of shortness of breath today  Objective:   Blood pressure (!) 114/59, pulse 70, temperature 97.6 F (36.4 C), temperature source Oral, resp. rate 18, height 5' 4"  (1.626 m), weight 176 lb (79.8 kg), SpO2 91 %.  .  Intake/Output Summary (Last 24 hours) at 03/06/17 1525 Last data filed at 03/06/17 1443  Gross per 24 hour  Intake           736.33 ml  Output              800 ml  Net           -63.67 ml    Exam VITAL SIGNS: Blood pressure (!) 114/59, pulse 70, temperature 97.6 F (36.4 C), temperature source Oral, resp. rate 18, height 5' 4"  (1.626 m), weight 176 lb (79.8 kg), SpO2 91 %.  GENERAL:  76 y.o.-year-old patient lying in the bed with no acute distress.  EYES: Pupils equal, round, reactive to light and accommodation. No scleral icterus. Extraocular muscles intact.  HEENT: Head atraumatic, normocephalic. Oropharynx and nasopharynx clear.  NECK:  Supple, no jugular venous distention. No thyroid enlargement, no  tenderness.  LUNGS:  Bilateral crackles at the bases  CARDIOVASCULAR: S1, S2 normal. No murmurs, rubs, or gallops.  ABDOMEN: Soft, nontender, nondistended. Bowel sounds present. No organomegaly or mass.  EXTREMITIES: No pedal edema, cyanosis, or clubbing.  NEUROLOGIC: Cranial nerves II through XII are intact. Muscle strength 5/5 in all extremities. Sensation intact. Gait not checked.  PSYCHIATRIC: The patient is alert and oriented x 3.  SKIN: No obvious rash, lesion, or ulcer.   Data Review     CBC w Diff:  Lab Results  Component Value Date   WBC 7.1 03/05/2017   HGB 9.9 (L) 03/05/2017   HCT 30.1 (L) 03/05/2017   PLT 193 03/05/2017   LYMPHOPCT 33 03/04/2017   MONOPCT 10 03/04/2017   EOSPCT 1 03/04/2017   BASOPCT 0 03/04/2017   CMP:  Lab Results  Component Value Date   NA 140 03/06/2017   K 3.3 (L) 03/06/2017   CL 103 03/06/2017   CO2 27 03/06/2017   BUN 17 03/06/2017   CREATININE 1.06 (H) 03/06/2017   PROT 7.9 03/04/2017   ALBUMIN 3.9 03/04/2017   BILITOT 0.8 03/04/2017   ALKPHOS 73 03/04/2017   AST 23 03/04/2017   ALT 12 (L) 03/04/2017  .  Micro Results No results found for this or any previous visit (from the past 240 hour(s)).      Code Status Orders        Start  Ordered   03/05/17 0123  Full code  Continuous     03/05/17 0122    Code Status History    Date Active Date Inactive Code Status Order ID Comments User Context   02/04/2017 11:40 PM 02/05/2017  9:31 PM Full Code [id number]  Lance Coon, MD ED    Advance Directive Documentation     Most Recent Value  Type of Advance Directive  Living will  Pre-existing out of facility DNR order (yellow form or pink MOST form)  -  "MOST" Form in Place?  -            Discharge Medications   Allergies as of 03/06/2017      Reactions   Celebrex [celecoxib] Anaphylaxis   Levaquin [levofloxacin In D5w] Other (See Comments)   Heart arrhthymias   Sulfa Antibiotics Other (See Comments)   Reaction:  unknown   Penicillins Rash   Has patient had a PCN reaction causing immediate rash, facial/tongue/throat swelling, SOB or lightheadedness with hypotension: Unknown Has patient had a PCN reaction causing severe rash involving mucus membranes or skin necrosis: No Has patient had a PCN reaction that required hospitalization: No Has patient had a PCN reaction occurring within the last 10 years: No If all of the above answers are "NO", then may proceed with Cephalosporin use.      Medication List    STOP taking these medications   amiodarone 200 MG tablet Commonly known as:  PACERONE   clopidogrel 75 MG tablet Commonly known as:  PLAVIX   metFORMIN 750 MG 24 hr tablet Commonly known as:  GLUCOPHAGE-XR   pioglitazone 15 MG tablet Commonly known as:  ACTOS     TAKE these medications   acetaminophen 500 MG tablet Commonly known as:  TYLENOL Take 500 mg by mouth at bedtime.   albuterol 108 (90 Base) MCG/ACT inhaler Commonly known as:  PROVENTIL HFA;VENTOLIN HFA Inhale 2 puffs into the lungs every 4 (four) hours as needed for wheezing or shortness of breath.   amiodarone 360-4.14 MG/200ML-% Soln Commonly known as:  NEXTERONE PREMIX Inject 33.33 mL/hr into the vein continuous.   carvedilol 3.125 MG tablet Commonly known as:  COREG Take 3.125 mg by mouth 2 (two) times daily.   FLUoxetine 20 MG capsule Commonly known as:  PROZAC Take 20 mg by mouth 2 (two) times daily.   Fluticasone-Salmeterol 250-50 MCG/DOSE Aepb Commonly known as:  ADVAIR DISKUS Inhale 1 puff into the lungs 2 (two) times daily.   levothyroxine 50 MCG tablet Commonly known as:  SYNTHROID, LEVOTHROID Take 50 mcg by mouth daily.   losartan 50 MG tablet Commonly known as:  COZAAR Take 50 mg by mouth daily.   multivitamin with minerals Tabs tablet Take 1 tablet by mouth daily.   niacin 500 MG CR capsule Take 500 mg by mouth 2 (two) times daily with a meal.   nitroGLYCERIN 0.4 MG SL tablet Commonly  known as:  NITROSTAT Place 0.4 mg under the tongue every 5 (five) minutes as needed for chest pain.   omega-3 acid ethyl esters 1 g capsule Commonly known as:  LOVAZA Take 1 g by mouth daily.   simvastatin 40 MG tablet Commonly known as:  ZOCOR Take 40 mg by mouth at bedtime.          Total Time in preparing paper work, data evaluation and todays exam - 35 minutes  Dustin Flock M.D on 03/06/2017 at 3:25 PM  [facility]   Office  [phone]

## 2017-03-06 NOTE — Care Management (Signed)
Patient's defibrillator continues to discharge so was transferred to ICU.  She will be transferred to [facility] on Lidocaine drip to be followed by EP cardiology

## 2017-03-06 NOTE — Progress Notes (Signed)
Family called nursing staff in to room.Per husband and daughter pt was shocked by ICD. Called Rapid response. Called CCMD per Katharine Look pt had 37 bean run of V-tach, to which AICD fired brining pt back into normal rhythm. Pt asymptomatic. BP 147/79, Pulse 70, and SpO2 93% on 2L/[place].  Dr. Saralyn Pilar with Cardiology and Dr. Posey Pronto notified, and now at bedside.  Per Dr. Saralyn Pilar, order obtained for EKG, to give Amiodarone bolus IV, and increase Amiodarone infusion rate to 33.3 ml/hr.  Pt remains asymptomatic at this time.  Will continue to monitor.

## 2017-03-06 NOTE — Progress Notes (Addendum)
   Pt arrived from [facility]. She is stable. Her ICD has been adjusted by [place]. Per Dr. Curt Bears will continue lidocaine drip and amiodarone. If she develops more VT, will stop amio and continue lidocaine.   Plavix not reordered as their is consideration for possible CT surgery. Re-eval tomorrow.   Daune Perch, AGNP-C [facility] 03/06/2017  6:48 PM Pager: [phone]

## 2017-03-06 NOTE — Progress Notes (Signed)
Evaluated and patient/family questions answered. May require revascularization with CABG (increased risk due to COPD, O2 at home; decreased LVEF) or high risk left main coronary PCI. Currently on clopidogrel, received this AM - will delay administering tomorrow's dose until she has been evaluated for surgery.Heather Klein, MD, [facility] [phone] office [phone] pager

## 2017-03-06 NOTE — Progress Notes (Signed)
Contacted by Dr. Posey Pronto to determine if pt requires transfer to ICU following the pt passing out for a few minutes secondary to a 37 beat run of Vtach and her AICD firing converting her back to sinus rhythm.  According to the pt this has occurred multiple times at home.  The pt is currently alert and oriented, following commands, paced rhythm on cardiac monitor, bp stable and no other episodes of vtach since episode this morning.  I spoke with Dr. Lorinda Creed regarding transfer to ICU he states he has adjusted her cardiac medications and does not require transfer to ICU at this time, however if she has another episode of Tanna Furry he is in agreement with transfer to ICU for closer monitoring.  I agree the pt does not require transfer to ICU at this time.  I also spoke with the telemetry nurse Janett Billow who is taking care of the pt and she states she is comfortable with the current plan of care and the pt remaining on the telemetry unit.  I also informed the RN if she needs further assistance to ICU or page on call number in [place] to speak with PCCM team.  Marda Stalker, [place] Pager [phone] (please enter 7 digits) [place] Pager [phone] (please enter 7 digits)   Merton Border, MD PCCM service Mobile [phone] Pager [phone] 03/06/2017 2:56 PM

## 2017-03-06 NOTE — Progress Notes (Signed)
Chaplain responded to RR page for pt in 252. [place] met pt, husband and daughter who were in the Rm with the medical team. The medical team was assessing the pt and discussing care plan. Pt was alert and seated on the reclining chair. [place] spoke with pt's husband briefly and provided emotion support for him and his daughter, and also offered support for the medical team. [place] to follow up pt as needed.   03/06/17 0900  Clinical Encounter Type  Visited With Patient;Patient and family together;Health care provider  Visit Type Initial;Code;Other (Comment)  Referral From Nurse  Consult/Referral To [place] (Comment)

## 2017-03-06 NOTE — Progress Notes (Signed)
[facility] Cardiology  SUBJECTIVE: Patient sitting in chair, denies chest pain or shortness of breath   Vitals:   03/05/17 1549 03/05/17 1754 03/05/17 2135 03/06/17 0738  BP: (!) 113/53 (!) 117/59 125/64 (!) 113/46  Pulse: 70 70 70 70  Resp: 18 18  18   Temp: 97.8 F (36.6 C) (!) 97.5 F (36.4 C) 98.1 F (36.7 C) 97.6 F (36.4 C)  TempSrc: Oral Oral Oral Oral  SpO2: 99% 96% 96% 94%  Weight:      Height:         Intake/Output Summary (Last 24 hours) at 03/06/17 0830 Last data filed at 03/06/17 5638  Gross per 24 hour  Intake           184.07 ml  Output              900 ml  Net          -715.93 ml      PHYSICAL EXAM  General: Well developed, well nourished, in no acute distress HEENT:  Normocephalic and atramatic Neck:  No JVD.  Lungs: Clear bilaterally to auscultation and percussion. Heart: HRRR . Normal S1 and S2 without gallops or murmurs.  Abdomen: Bowel sounds are positive, abdomen soft and non-tender  Msk:  Back normal, normal gait. Normal strength and tone for age. Extremities: No clubbing, cyanosis or edema.   Neuro: Alert and oriented X 3. Psych:  Good affect, responds appropriately   LABS: Basic Metabolic Panel:  Recent Labs  03/05/17 0541 03/06/17 0434  NA 139 140  K 3.2* 3.3*  CL 102 103  CO2 27 27  GLUCOSE 109* 130*  BUN 20 17  CREATININE 1.11* 1.06*  CALCIUM 8.7* 8.6*  MG 2.6*  --    Liver Function Tests:  Recent Labs  03/04/17 2247  AST 23  ALT 12*  ALKPHOS 73  BILITOT 0.8  PROT 7.9  ALBUMIN 3.9   No results for input(s): LIPASE, AMYLASE in the last 72 hours. CBC:  Recent Labs  03/04/17 2247 03/05/17 0541  WBC 11.7* 7.1  NEUTROABS 6.5  --   HGB 10.9* 9.9*  HCT 32.6* 30.1*  MCV 87.8 87.3  PLT 248 193   Cardiac Enzymes:  Recent Labs  03/05/17 0541 03/05/17 1039 03/05/17 1715  TROPONINI 0.30* 0.25* 0.16*   BNP: Invalid input(s): POCBNP D-Dimer: No results for input(s): DDIMER in the last 72 hours. Hemoglobin  A1C: No results for input(s): HGBA1C in the last 72 hours. Fasting Lipid Panel: No results for input(s): CHOL, HDL, LDLCALC, TRIG, CHOLHDL, LDLDIRECT in the last 72 hours. Thyroid Function Tests: No results for input(s): TSH, T4TOTAL, T3FREE, THYROIDAB in the last 72 hours.  Invalid input(s): FREET3 Anemia Panel: No results for input(s): VITAMINB12, FOLATE, FERRITIN, TIBC, IRON, RETICCTPCT in the last 72 hours.  Dg Chest Port 1 View  Result Date: 03/05/2017 CLINICAL DATA:  Syncopal episode, defibrillator shocks. EXAM: PORTABLE CHEST 1 VIEW COMPARISON:  Chest radiograph February 04, 2017 FINDINGS: Cardiac silhouette is similarly enlarged. Calcified aortic knob. No pleural effusion or focal consolidation. No pneumothorax. LEFT defibrillator in situ. Pacer pad over LEFT chest. Multiple EKG lines overlie the patient and may obscure subtle underlying pathology. Soft tissue planes and included osseous structures are unchanged. IMPRESSION: Stable cardiomegaly.  No acute pulmonary process. Aortic Atherosclerosis (ICD10-I70.0). Electronically Signed   By: Elon Alas M.D.   On: 03/05/2017 00:19     Echo   TELEMETRY: A paced V sensed:  ASSESSMENT AND PLAN:  Principal Problem:  Defibrillator discharge Active Problems:   CAD (coronary artery disease)   HTN (hypertension)   Diabetes (HCC)   Chronic combined systolic and diastolic CHF (congestive heart failure) (HCC)   Ventricular tachycardia ([place])    1.  Recurrent ventricular tachycardia/ventricular fibrillation events requiring multiple ICD delivered shocks, stabilized on amiodarone drip 2.  Known CAD, history of prior MI, and coronary stents.  Cardiac catheterization 03/05/2017 revealed patent stents mid LAD and proximal left circumflex, with eccentric, calcified 75% stenosis ostium left main, and 90% stenosis distal LAD.  Left ventriculography revealed severe, dilated ischemic cardiomyopathy with anteroapical dyskinesis.  Patient has  not about transfer to [facility] or [facility] to consider high risk CABG, or even PCI of left main. 3.  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  Continue amiodarone drip for now 2.  Consult Dr. Virl Axe for further management of ventricular tachycardia 3.  DC losartan 4.  Start Entresto on 03/07/2017 5.  Anticipate discharge on 03/07/2017 on higher dose of p.o. amiodarone and Markus Daft, MD, PhD, [facility] 03/06/2017 8:30 AM

## 2017-03-06 NOTE — Progress Notes (Addendum)
CCMD called to report pt in v.tach( 22beats)/ RN to bedside/pt alert and oriented / following commands when nurse arrived/ back to paced rhythm/  per family pt had LOC for about 2 sec/ defib fired per pt / MD notified and CCU charge nurse notified/ pt transferred to CCU.

## 2017-03-06 NOTE — Consult Note (Signed)
ELECTROPHYSIOLOGY CONSULT NOTE  Patient ID: Heather Peterson, MRN: [id number], DOB/AGE: Mar 05, 1941 76 y.o. Admit date: 03/04/2017 Date of Consult: 03/06/2017  Primary Physician: Heather Hartigan, Peterson Primary Cardiologist: Heather Peterson is a 76 y.o. female who is being seen today for the evaluation of VT  at the request of AP.   Chief Complaint: VT   HPI Heather Peterson is a 76 y.o. female  We are asked to see because of multiple episodes of ventricular tachycardia treated by her St. Jude ICD implanted I gather [place]  She has a history of ischemic heart disease with prior MI and 9 stenting of her circumflex in 2013 recurrent MI 7846 complicated by cardiac arrest and stenting of her LAD. LVEF 35-40% 9/18. 9/18 she also had an ICD shock . Report describes ventricular fibrillation?Marland Kitchen She was started on amiodarone 200 mg twice a day 1 week. She is also discharged on carvedilol Plavix Cozaar and modest dose statins  She also has a history of an ICD discharge in the context of Levaquin. 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 Dyspnea limits her 20 feet;  this has not been particularly worse as of late. she has some peripheral edema. She denies recent problems with chest pain.  Past Medical History:  Diagnosis Date  . Chronic combined systolic and diastolic CHF (congestive heart failure) ([place])   . Diabetes mellitus without complication ([place])   . Hypertension   . MI (myocardial infarction) ([place])    x 5  . Pacemaker       Surgical History:  Past Surgical History:  Procedure Laterality Date  . LEFT HEART CATH AND CORONARY ANGIOGRAPHY N/A 03/05/2017   Procedure: LEFT HEART CATH AND CORONARY ANGIOGRAPHY;  Surgeon: Heather Peterson;  Location: [facility];  Service: Cardiovascular;  Laterality: N/A;  .  pacemaker/defibrillator Left      Home Meds: Prior to Admission medications   Medication Sig Start Date End Date Taking? Authorizing Provider  acetaminophen (TYLENOL) 500 MG tablet Take 500 mg by mouth at bedtime.   Yes Provider, Historical, Peterson  albuterol (PROVENTIL HFA;VENTOLIN HFA) 108 (90 Base) MCG/ACT inhaler Inhale 2 puffs into the lungs every 4 (four) hours as needed for wheezing or shortness of breath. 02/05/17  Yes Peterson, Heather Silver, Peterson  amiodarone (PACERONE) 200 MG tablet Take 1 tablet (200 mg total) by mouth 2 (two) times daily. Amiodarone 200 mg twice a day for one week followed by 200 mg once daily Patient taking differently: Take 200 mg by mouth daily.  02/05/17  Yes Peterson, Heather Silver, Peterson  carvedilol (COREG) 3.125 MG tablet Take 3.125 mg by mouth 2 (two) times daily. 12/30/16  Yes Provider, Historical, Peterson  clopidogrel (PLAVIX) 75 MG tablet Take 75 mg by mouth daily. 12/30/16  Yes Provider, Historical, Peterson  FLUoxetine (PROZAC) 20 MG capsule Take 20 mg by mouth 2 (two) times daily. 10/28/16  Yes Provider, Historical, Peterson  Fluticasone-Salmeterol (ADVAIR DISKUS) 250-50 MCG/DOSE AEPB Inhale 1 puff into the lungs 2 (two) times daily. 02/05/17 02/05/18 Yes Peterson, Aruna, Peterson  levothyroxine (SYNTHROID, LEVOTHROID) 50 MCG tablet Take 50 mcg by mouth daily. 12/13/16  Yes Provider, Historical, Peterson  losartan (COZAAR) 50 MG tablet Take 50 mg by mouth daily.   Yes Provider, Historical, Peterson  metFORMIN (GLUCOPHAGE-XR) 750 MG 24 hr tablet Take 375 mg  by mouth 2 (two) times daily.    Yes Provider, Historical, Peterson  Multiple Vitamin (MULTIVITAMIN WITH MINERALS) TABS tablet Take 1 tablet by mouth daily.   Yes Provider, Historical, Peterson  niacin 500 MG CR capsule Take 500 mg by mouth 2 (two) times daily with a meal.   Yes Provider, Historical, Peterson  nitroGLYCERIN (NITROSTAT) 0.4 MG SL tablet Place 0.4 mg under the tongue every 5 (five) minutes as needed for chest pain.   Yes Provider, Historical, Peterson  omega-3 acid ethyl esters (LOVAZA)  1 g capsule Take 1 g by mouth daily.    Yes Provider, Historical, Peterson  pioglitazone (ACTOS) 15 MG tablet Take 15 mg by mouth daily. 11/19/16  Yes Provider, Historical, Peterson  simvastatin (ZOCOR) 40 MG tablet Take 40 mg by mouth at bedtime.    Yes Provider, Historical, Peterson    Inpatient Medications:  . carvedilol  3.125 mg Oral BID  . clopidogrel  75 mg Oral Daily  . enoxaparin (LOVENOX) injection  40 mg Subcutaneous Q24H  . FLUoxetine  20 mg Oral BID  . levothyroxine  50 mcg Oral QAC breakfast  . losartan  50 mg Oral Daily  . mometasone-formoterol  2 puff Inhalation BID  . simvastatin  40 mg Oral QHS  . sodium chloride flush  3 mL Intravenous Q12H      Allergies:  Allergies  Allergen Reactions  . Celebrex [Celecoxib] Anaphylaxis  . Levaquin [Levofloxacin In D5w] Other (See Comments)    Heart arrhthymias  . Sulfa Antibiotics Other (See Comments)    Reaction: unknown  . Penicillins Rash    Has patient had a PCN reaction causing immediate rash, facial/tongue/throat swelling, SOB or lightheadedness with hypotension: Unknown Has patient had a PCN reaction causing severe rash involving mucus membranes or skin necrosis: No Has patient had a PCN reaction that required hospitalization: No Has patient had a PCN reaction occurring within the last 10 years: No If all of the above answers are "NO", then may proceed with Cephalosporin use.     Social History   Social History  . Marital status: Married    Spouse name: N/A  . Number of children: N/A  . Years of education: N/A   Occupational History  . Not on file.   Social History Main Topics  . Smoking status: Never Smoker  . Smokeless tobacco: Never Used  . Alcohol use Yes     Comment: occasionally  . Drug use: No  . Sexual activity: Not on file   Other Topics Concern  . Not on file   Social History Narrative  . No narrative on file     Family History  Problem Relation Age of Onset  . Hypertension Father   . Heart attack  Father      ROS:  Please see the history of present illness.     All other systems reviewed and negative.    Physical Exam: Blood pressure (!) 113/46, pulse 70, temperature 97.6 F (36.4 C), temperature source Oral, resp. rate 18, height 5' 4"  (1.626 m), weight 176 lb (79.8 kg), SpO2 94 %. General: Well developed, somewhat disheveled Caucasian female   mild respiratory distress wearing oxygenNormocephalic, atraumatic, sclera non-icteric, no xanthomas, nares are without discharge. EENT: normal Lymph Nodes:  none Back: with mild kyphosis, no CVA tendersness Neck: Negative for carotid bruits. JVD not elevated. Lungs:  Some bibasilar rales and rhonchi Breathing slightly labored  Heart: RRR with S1 S2.  2/6 systolic murmur , rubs, or  gallops appreciated. Abdomen: Soft, non-tender, non-distended with normoactive bowel sounds. No hepatomegaly. No rebound/guarding. No obvious abdominal masses. Msk:  Strength and tone appear normal for age. Extremities: No clubbing or cyanosis. No edema.  Distal pedal pulses are 2+ and equal bilaterally. Skin: Warm and Dry Neuro: Alert and oriented X 3. CN III-XII intact Grossly normal sensory and motor function . Psych:  Responds to questions appropriately with a normal affect.      Labs: Cardiac Enzymes  Recent Labs  03/04/17 2247 03/05/17 0541 03/05/17 1039 03/05/17 1715  TROPONINI <0.03 0.30* 0.25* 0.16*   CBC Lab Results  Component Value Date   WBC 7.1 03/05/2017   HGB 9.9 (L) 03/05/2017   HCT 30.1 (L) 03/05/2017   MCV 87.3 03/05/2017   PLT 193 03/05/2017   PROTIME: No results for input(s): LABPROT, INR in the last 72 hours. Chemistry  Recent Labs Lab 03/04/17 2247  03/06/17 0434  NA 139  < > 140  K 3.5  < > 3.3*  CL 101  < > 103  CO2 24  < > 27  BUN 23*  < > 17  CREATININE 1.32*  < > 1.06*  CALCIUM 9.6  < > 8.6*  PROT 7.9  --   --   BILITOT 0.8  --   --   ALKPHOS 73  --   --   ALT 12*  --   --   AST 23  --   --   GLUCOSE  180*  < > 130*  < > = values in this interval not displayed. Lipids No results found for: CHOL, HDL, LDLCALC, TRIG BNP No results found for: PROBNP Thyroid Function Tests: No results for input(s): TSH, T4TOTAL, T3FREE, THYROIDAB in the last 72 hours.  Invalid input(s): FREET3    Miscellaneous No results found for: DDIMER  Radiology/Studies:  Dg Chest 2 View  Result Date: 02/04/2017 CLINICAL DATA:  Syncope EXAM: CHEST  2 VIEW COMPARISON:  None. FINDINGS: Left-sided duo lead pacing device. Mild cardiomegaly with aortic atherosclerosis. No consolidation or effusion. No pneumothorax. Degenerative changes of the spine. IMPRESSION: Mild to moderate cardiomegaly.  Negative for edema or infiltrate. Electronically Signed   By: Donavan Foil M.D.   On: 02/04/2017 21:49   Ct Head Wo Contrast  Result Date: 02/04/2017 CLINICAL DATA:  Seizure, syncope EXAM: CT HEAD WITHOUT CONTRAST TECHNIQUE: Contiguous axial images were obtained from the base of the skull through the vertex without intravenous contrast. COMPARISON:  None. FINDINGS: Brain: No acute territorial infarction, hemorrhage or intracranial mass is seen. 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 Mild ex vacuo dilatation of the right lateral ventricle. Mild to moderate small vessel ischemic changes of the white matter. Mild to moderate atrophy. Vascular: No hyperdense vessels. Left vertebral and bilateral carotid artery calcifications. Skull: No fracture or suspicious bone lesion Sinuses/Orbits: No acute finding. Other: None IMPRESSION: 1. No CT evidence for acute intracranial abnormality. 2. Old right frontal lobe infarct. Atrophy and small vessel ischemic changes of the white matter Electronically Signed   By: Donavan Foil M.D.   On: 02/04/2017 21:53   Dg Chest Port 1 View  Result Date: 03/05/2017 CLINICAL DATA:  Syncopal episode, defibrillator shocks. EXAM: PORTABLE CHEST 1 VIEW COMPARISON:  Chest radiograph February 04, 2017  FINDINGS: Cardiac silhouette is similarly enlarged. Calcified aortic knob. No pleural effusion or focal consolidation. No pneumothorax. LEFT defibrillator in situ. Pacer pad over LEFT chest. Multiple EKG lines overlie the patient and may obscure subtle  underlying pathology. Soft tissue planes and included osseous structures are unchanged. IMPRESSION: Stable cardiomegaly.  No acute pulmonary process. Aortic Atherosclerosis (ICD10-I70.0). Electronically Signed   By: Elon Alas M.D.   On: 03/05/2017 00:19    EKG: Atrial paced with a narrow QRS Personally reviewed    Device interrogation-St. Jude dual-chamber Personally reviewed   Multiple episodes of monomorphic ventricular tachycardia/flutter with failed antitachycardia pacing for termination resulted in shock. 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 Jude  Oxygen-dependent COPD   Patient has recurrent ventricular tachycardia-monomorphic. The flurry of activity suggests that there is an acute change in her substrate. Catheterization is concerning for ischemia. Revascularization has been suggested. I don't know whether she is a candidate for left main stenting versus bypass or not, but in the interim, aggressive anti-ischemic therapy is indicated. Ranolazine and/or nitrates would be appropriate  In this regard amiodarone is also known to be an anti-ischemic agent. I would continue to load her with a transition from IV-oral scheduled for tomorrow with 24 hours of observation following oral initiation area I would treated with 400 mg twice a day for 2 weeks and 400 mg daily for a month.  Blood pressure has been a problem in the past; I suspect she will not tolerate  Entresto. Hence, would add ranolazine empirically to her amiodarone.  Without strong contraindication would resume aspirin Thank you for the consult       Virl Axe

## 2017-03-06 NOTE — Progress Notes (Addendum)
Recurrent Vt noted This however was quite different and represents torsade de pointes.  She has a history of something similar; this raises the specter that amiodarone could be contributing although it almost never is associated with torsade  Her Prozac is long-standing. We will discontinue her duonebs  I will arrange for reprogramming of her atrial rate to 80. Lidocaine s an adjunct might be useful as a 1B agent as Ischemia certainly may be a triggering influences here. A decision prior to discharge as to revascularization options might be more prudent and consideration for transfer should be had  If she is not to be transferred would switch lido to mexilitene   Reviewed this w family and will call Dr AP  12:31-13:26

## 2017-03-06 NOTE — Progress Notes (Signed)
Rapid Response called due to pt loss of consciousness.  Pt had 37 bean run of V-tach, to which AICD fired brining pt back into normal rhythm.  Upon Rapid Response team's arrival to room, pt is awake, alert and oriented, no complaints of chest pain.  BP 147/79, Pulse 70, and SpO2 93% on [place].  Dr. Saralyn Pilar with Cardiology and Dr. Posey Pronto notified, and now at bedside.  Per Dr. Saralyn Pilar, order obtained for EKG, to give Amiodarone bolus IV, and increase Amiodarone infusion rate to 33.3 ml/hr.  Pt remains asymptomatic at this time.  Per Dr. Saralyn Pilar, pt will remain in current room at this time.  Encouraged nursing staff on unit to call with any other concerns or needed assistance.

## 2017-03-07 ENCOUNTER — Encounter (HOSPITAL_COMMUNITY): Payer: Self-pay | Admitting: Radiology

## 2017-03-07 ENCOUNTER — Inpatient Hospital Stay (HOSPITAL_COMMUNITY): Payer: Medicare (Managed Care)

## 2017-03-07 ENCOUNTER — Encounter (HOSPITAL_COMMUNITY): Payer: Medicare Other

## 2017-03-07 DIAGNOSIS — I2510 Atherosclerotic heart disease of native coronary artery without angina pectoris: Secondary | ICD-10-CM

## 2017-03-07 DIAGNOSIS — R55 Syncope and collapse: Secondary | ICD-10-CM

## 2017-03-07 DIAGNOSIS — I25110 Atherosclerotic heart disease of native coronary artery with unstable angina pectoris: Secondary | ICD-10-CM

## 2017-03-07 DIAGNOSIS — I5042 Chronic combined systolic (congestive) and diastolic (congestive) heart failure: Secondary | ICD-10-CM

## 2017-03-07 DIAGNOSIS — I472 Ventricular tachycardia: Principal | ICD-10-CM

## 2017-03-07 LAB — BASIC METABOLIC PANEL
Anion gap: 8 (ref 5–15)
BUN: 13 mg/dL (ref 6–20)
CO2: 27 mmol/L (ref 22–32)
Calcium: 8.9 mg/dL (ref 8.9–10.3)
Chloride: 101 mmol/L (ref 101–111)
Creatinine, Ser: 1.11 mg/dL — ABNORMAL HIGH (ref 0.44–1.00)
GFR calc Af Amer: 55 mL/min — ABNORMAL LOW (ref >60)
GFR calc non Af Amer: 47 mL/min — ABNORMAL LOW (ref >60)
Glucose, Bld: 133 mg/dL — ABNORMAL HIGH (ref 65–99)
Potassium: 4.9 mmol/L (ref 3.5–5.1)
Sodium: 136 mmol/L (ref 135–145)

## 2017-03-07 LAB — GLUCOSE, CAPILLARY
Glucose-Capillary: 115 mg/dL — ABNORMAL HIGH (ref 65–99)
Glucose-Capillary: 107 mg/dL — ABNORMAL HIGH (ref 65–99)
Glucose-Capillary: 105 mg/dL — ABNORMAL HIGH (ref 65–99)
Glucose-Capillary: 168 mg/dL — ABNORMAL HIGH (ref 65–99)

## 2017-03-07 LAB — LIDOCAINE LEVEL: Lidocaine Lvl: 2.4 ug/mL (ref 1.5–5.0)

## 2017-03-07 LAB — PLATELET INHIBITION P2Y12: Platelet Function  P2Y12: 307 [PRU] (ref 194–418)

## 2017-03-07 MED ORDER — IOPAMIDOL (ISOVUE-370) INJECTION 76%
INTRAVENOUS | Status: AC
  Administered 2017-03-07: 100 mL
  Filled 2017-03-07: qty 100

## 2017-03-07 MED ORDER — INFLUENZA VAC SPLIT HIGH-DOSE 0.5 ML IM SUSY: 0.5000 mL | PREFILLED_SYRINGE | INTRAMUSCULAR | Status: DC | PRN

## 2017-03-07 MED ORDER — FUROSEMIDE 10 MG/ML IJ SOLN
40.0000 mg | Freq: Once | INTRAMUSCULAR | Status: AC
  Administered 2017-03-07: 40 mg via INTRAVENOUS
  Filled 2017-03-07: qty 4

## 2017-03-07 MED ORDER — FUROSEMIDE 10 MG/ML IJ SOLN
40.0000 mg | Freq: Two times a day (BID) | INTRAMUSCULAR | Status: DC
  Administered 2017-03-07 – 2017-03-13 (×11): 40 mg via INTRAVENOUS
  Filled 2017-03-07 (×12): qty 4

## 2017-03-07 NOTE — Care Management Note (Addendum)
Case Management Note  Patient Details  Name: Heather Peterson MRN: [id number] Date of Birth: 1940/06/11  Subjective/Objective: Transfer from [facility]  history of CAD, chronic systolic CHF, VT/VF s/p ICD, DM, oxygen dependent COPD and ongoing tobacco abuse  S/p cath, pending eval for TCTS.   10/30 [place], BSN - s/p cath,  left main disease is not significant.  She will be managed medically.  PCI/stenting is not indicated at this time.                  Action/Plan: NCM will follow for dc needs.   Expected Discharge Date:                  Expected Discharge Plan:     In-House Referral:     Discharge planning Services  CM Consult  Post Acute Care Choice:    Choice offered to:     DME Arranged:    DME Agency:     HH Arranged:    HH Agency:     Status of Service:  In process, will continue to follow  If discussed at Long Length of Stay Meetings, dates discussed:    Additional Comments:  Zenon Mayo, RN 03/07/2017, 4:13 PM

## 2017-03-07 NOTE — Consult Note (Signed)
Cardiology Consultation:   Patient ID: [name]; [id number]; 05/20/1940   Admit date: 03/06/2017 Date of Consult: 03/07/2017  Primary Care Provider: [name], MD Primary Cardiologist: Paraschos Primary Electrophysiologist:  Caryl Comes   Patient Profile:   Heather Peterson is a 76 y.o. female with a hx of severe CAD, Ischemic CM with Left Main CAD & Recurrent Ventricular Tachycardia who is being seen today for the evaluation of severe left main CAD at the request of Dr. Caryl Comes.  History of Present Illness:   Heather Peterson 76 year old woman with a several year history of CAD status post PCI to the LAD and circumflex dating back to 2013 (prior MI and a total of 9 stents in the circumflex  between 2013 and 2015 -PCI of the LAD after cardiac arrest).  She has chronic combined systolic and diastolic heart failure as well as history of VF/VT status post ICD placement.  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    She was seen and evaluated by Dr. Saralyn Pilar at [facility] for recurrent ICD shocks.  She had been started on amiodarone along with her carvedilol and Plavix, Cozaar and statin. Apparently ICD interrogation revealed 13 episodes of ventricular tachycardia and V. fib.  1 of these episodes required further shocking.  The V. fib episode required 3 shocks.  She has now been placed on amiodarone.  Had mild troponin elevation but underwent cardiac catheterization by Dr. Saralyn Pilar that revealed patent stents in the LAD and circumflex with distal LAD 95% as well as an ostial/proximal calcified eccentric left main stenosis.  She was transferred to [facility] for consideration of complex high risk PCI versus CABG.  I was consulted by Dr. Caryl Comes as the interventionalist on call to discuss potential options with this patient. She is currently chest pain-free.  She is relatively inactive at home, limited a lot by her COPD  related dyspnea.  She has baseline orthopnea and PND despite wearing home oxygen.  About 75% of her time is spent sitting in in either bed or chair.  All she does walk around the house some.  She has not had angina while doing that, but does have significant dyspnea.  She was actually seen for syncope episodes back in September, likely related to her ventricular arrhythmias. She denies any TIA or amaurosis fugax symptoms.  No melena, hematochezia or hematuria.  She does not walk enough to note claudication.   Past Medical History:  Diagnosis Date  . Chronic combined systolic and diastolic CHF (congestive heart failure) ([place])   . Diabetes mellitus without complication ([place])   . Hypertension   . MI (myocardial infarction) ([place])    x 5  . Pacemaker     Past Surgical History:  Procedure Laterality Date  . LEFT HEART CATH AND CORONARY ANGIOGRAPHY N/A 03/05/2017   Procedure: LEFT HEART CATH AND CORONARY ANGIOGRAPHY;  Surgeon: Isaias Cowman, MD;  Location: [facility];  Service: Cardiovascular;  Laterality: N/A;  . pacemaker/defibrillator Left      Home Medications:  Prior to Admission medications   Medication Sig Start Date End Date Taking? Authorizing Provider  acetaminophen (TYLENOL) 500 MG tablet Take 500 mg by mouth at bedtime.   Yes [provider]  albuterol (PROVENTIL HFA;VENTOLIN HFA) 108 (90 Base) MCG/ACT inhaler Inhale 2 puffs into the lungs every 4 (four) hours as needed for wheezing or shortness of breath. 02/05/17  Yes Gouru, Illene Silver, MD  carvedilol (COREG)  3.125 MG tablet Take 3.125 mg by mouth 2 (two) times daily. 12/30/16  Yes [provider]  FLUoxetine (PROZAC) 20 MG capsule Take 20 mg by mouth 2 (two) times daily. 10/28/16  Yes [provider]  Fluticasone-Salmeterol (ADVAIR DISKUS) 250-50 MCG/DOSE AEPB Inhale 1 puff into the lungs 2 (two) times daily. 02/05/17 02/05/18 Yes Gouru, Aruna, MD  levothyroxine (SYNTHROID, LEVOTHROID) 50 MCG  tablet Take 50 mcg by mouth daily. 12/13/16  Yes [provider]  losartan (COZAAR) 50 MG tablet Take 50 mg by mouth daily.   Yes [provider]  metFORMIN (GLUCOPHAGE-XR) 750 MG 24 hr tablet Take 750 mg by mouth daily with breakfast.   Yes [provider]  Multiple Vitamin (MULTIVITAMIN WITH MINERALS) TABS tablet Take 1 tablet by mouth daily.   Yes [provider]  niacin 500 MG CR capsule Take 1,500 mg by mouth 2 (two) times daily with a meal.    Yes [provider]  omega-3 acid ethyl esters (LOVAZA) 1 g capsule Take 1 g by mouth daily.    Yes [provider]  simvastatin (ZOCOR) 40 MG tablet Take 20 mg by mouth at bedtime.    Yes [provider]  amiodarone (NEXTERONE PREMIX) 360-4.14 MG/200ML-% SOLN Inject 33.33 mL/hr into the vein continuous. 03/06/17   Dustin Flock, MD  nitroGLYCERIN (NITROSTAT) 0.4 MG SL tablet Place 0.4 mg under the tongue every 5 (five) minutes as needed for chest pain.    [provider]    Inpatient Medications: Scheduled Meds: . acetaminophen  500 mg Oral QHS  . aspirin  81 mg Oral Daily  . carvedilol  3.125 mg Oral BID  . furosemide  40 mg Intravenous BID  . heparin  5,000 Units Subcutaneous Q8H  . insulin aspart  0-15 Units Subcutaneous TID WC  . insulin aspart  0-5 Units Subcutaneous QHS  . levothyroxine  50 mcg Oral Daily  . mouth rinse  15 mL Mouth Rinse BID  . ranolazine  500 mg Oral BID  . simvastatin  40 mg Oral QHS   Continuous Infusions: . amiodarone 30 mg/hr (03/07/17 1637)  . lidocaine 1 mg/min (03/07/17 0700)   PRN Meds: Influenza vac split quadrivalent PF, ipratropium-albuterol, LORazepam, nitroGLYCERIN, sodium chloride  Allergies:    Allergies  Allergen Reactions  . Celebrex [Celecoxib] Anaphylaxis  . Levaquin [Levofloxacin In D5w] Other (See Comments)    Heart arrhthymias  . Sulfa Antibiotics Other (See Comments)    Reaction: unknown  . Penicillins Rash     Has patient had a PCN reaction causing immediate rash, facial/tongue/throat swelling, SOB or lightheadedness with hypotension: Unknown Has patient had a PCN reaction causing severe rash involving mucus membranes or skin necrosis: No Has patient had a PCN reaction that required hospitalization: No Has patient had a PCN reaction occurring within the last 10 years: No If all of the above answers are "NO", then may proceed with Cephalosporin use.     Social History:   Social History   Social History  . Marital status: Married    Spouse name: N/A  . Number of children: N/A  . Years of education: N/A   Occupational History  . Not on file.   Social History Main Topics  . Smoking status: Never Smoker  . Smokeless tobacco: Never Used  . Alcohol use Yes     Comment: occasionally  . Drug use: No  . Sexual activity: Not on file   Other Topics Concern  . Not  on file   Social History Narrative  . No narrative on file    Family History:    Family History  Problem Relation Age of Onset  . Hypertension Father   . Heart attack Father      ROS:  Please see the history of present illness.  Review of Systems  Constitution: Positive for weakness and malaise/fatigue.  Cardiovascular: Positive for dyspnea on exertion, irregular heartbeat, orthopnea, palpitations and syncope. Negative for chest pain.  Respiratory: Positive for cough and shortness of breath.   Gastrointestinal: Positive for heartburn. Negative for bloating, abdominal pain, bowel incontinence, diarrhea, hematemesis, hematochezia and melena.  Genitourinary: Negative for frequency, hematuria and nocturia.  Neurological: Negative for focal weakness, light-headedness and loss of balance.  Psychiatric/Behavioral: Positive for memory loss.  All other systems reviewed and are negative.  All other ROS reviewed and negative.     Physical Exam/Data:   Vitals:   03/07/17 1135 03/07/17 1200 03/07/17 1300 03/07/17 1516  BP: 116/71  107/62 107/64 109/64  Pulse: 80 80 80 80  Resp: 15 (!) 26 17 17   Temp: 98.2 F (36.8 C)   (!) 97.5 F (36.4 C)  TempSrc: Oral   Oral  SpO2: 97% 96% 95% 94%  Weight:      Height:        Intake/Output Summary (Last 24 hours) at 03/07/17 1947 Last data filed at 03/07/17 1900  Gross per 24 hour  Intake           881.63 ml  Output             2875 ml  Net         -1993.37 ml   Filed Weights   03/06/17 1730  Weight: 179 lb 7.3 oz (81.4 kg)   Body mass index is 30.8 kg/m.  General: Chronically ill-appearing woman sitting in bed relatively comfortable at this point.  She has nasal cannula oxygen on.  No acute distress HEENT:  [place]/AT, EOMI Lymph: no adenopathy Neck: Supple, no obvious JVD or bruit. Endocrine:  No thryomegaly Vascular: No carotid bruits; FA pulses 2+ bilaterally without bruits  Cardiac:  normal S1, S2 -difficult to hear but may be a soft S4 gallop; RRR; no murmur  Lungs:  clear to auscultation bilaterally, no wheezing, rhonchi or rales  Abd: soft, nontender, no hepatomegaly  Ext: Trace edema Musculoskeletal:  No deformities, BUE and BLE strength normal and equal Skin: warm and dry  Neuro:  CNs 2-12 intact, no focal abnormalities noted Psych:  Normal affect   EKG:  The EKG was personally reviewed and demonstrates: Atrial paced rhythm Telemetry:  Telemetry was personally reviewed and demonstrates: Atrial paced ventricular sensed  Relevant CV Studies: I have independently reviewed both the cardiac catheterization images and the echocardiogram. My impression of the ejection fraction is closer to the 30% noted on LV gram then on echocardiogram. LEFT HEART CATH AND CORONARY ANGIOGRAPHY  Conclusion     Ost LM lesion, 75 %stenosed.  Prox LAD to Dist LAD stent, 0 %stenosed.  Dist LAD lesion, 90 %stenosed.  Beyond the stented segment  Prox Cx to Mid Cx stented segment, 0 %stenosed.  Ost 2nd Mrg to 2nd Mrg lesion, 50 %stenosed.  Prox RCA lesion, 30 %stenosedMid  RCA-1 lesion, 30 %stenosed.Mid RCA-2 lesion, 30 %stenosed.   1. Patent stents mid LAD and proximal left circumflex 2. 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 90% stenosis mid to distal LAD 4. Severe, ischemic cardiomyopathy, with LVEF of  30% with anterior apical akinesis  Recommendations  1. Three-vessel CABG, versus complex, high risk PCI ostial left main and distal LAD, versus medical therapy  Diagnostic Diagram    2d ECHO:  Left ventricle: The cavity size was mildly dilated. Wall   thickness was normal. Systolic function was moderately reduced.   The estimated ejection fraction was in the range of 35% to 40%.   Wall motion was normal; there were no regional wall motion   abnormalities. Doppler parameters are consistent with abnormal   left ventricular relaxation (grade 1 diastolic dysfunction). - Mitral valve: There was moderate regurgitation. - Left atrium: The atrium was mildly dilated. - Right ventricle: The cavity size was mildly dilated. Wall   thickness was normal.  Impressions:  - Moderately reduced left ventricular function   inferior apical severe hypokinesis   mild left ventricular enlargement   ejection fraction between 35 and 40%   pacer wire in RV  Laboratory Data:  Chemistry Recent Labs Lab 03/05/17 0541 03/06/17 0434 03/06/17 1859 03/07/17 0206  NA 139 140  --  136  K 3.2* 3.3*  --  4.9  CL 102 103  --  101  CO2 27 27  --  27  GLUCOSE 109* 130*  --  133*  BUN 20 17  --  13  CREATININE 1.11* 1.06* 1.00 1.11*  CALCIUM 8.7* 8.6*  --  8.9  GFRNONAA 47* 50* 54* 47*  GFRAA 55* 58* >60 55*  ANIONGAP 10 10  --  8     Recent Labs Lab 03/04/17 2247  PROT 7.9  ALBUMIN 3.9  AST 23  ALT 12*  ALKPHOS 73  BILITOT 0.8   Hematology Recent Labs Lab 03/04/17 2247 03/05/17 0541 03/06/17 1859  WBC 11.7* 7.1 9.0  RBC 3.72* 3.45* 3.43*  HGB 10.9* 9.9* 9.8*  HCT 32.6* 30.1* 30.8*  MCV 87.8 87.3 89.8  MCH 29.2 28.7 28.6    MCHC 33.3 32.9 31.8  RDW 18.3* 18.2* 17.7*  PLT 248 193 204   Cardiac Enzymes Recent Labs Lab 03/04/17 2247 03/05/17 0541 03/05/17 1039 03/05/17 1715  TROPONINI <0.03 0.30* 0.25* 0.16*   No results for input(s): TROPIPOC in the last 168 hours.  BNP Recent Labs Lab 03/04/17 2247  BNP 833.0*    DDimer No results for input(s): DDIMER in the last 168 hours.  Radiology/Studies:  Dg Chest Port 1 View  Result Date: 03/06/2017 CLINICAL DATA:  Shortness of Breath EXAM: PORTABLE CHEST 1 VIEW COMPARISON:  March 04, 2017 FINDINGS: There is interstitial edema bilaterally, primarily in the mid and lower lung zones. There is no airspace consolidation. There is cardiomegaly with pulmonary venous hypertension. Pacemaker leads are attached to the right atrium and right ventricle. There is aortic atherosclerosis. There is calcification in the left carotid artery. There is also calcification in the left anterior descending coronary artery. No adenopathy.  No evident bone lesions. IMPRESSION: Cardiomegaly with pulmonary venous hypertension consistent with pulmonary vascular congestion. There is no a degree of interstitial edema. No airspace consolidation. Pacemaker leads are attached to the right atrium and right ventricle. Aortic atherosclerosis. Calcification in left carotid artery. Coronary artery calcification noted. Aortic Atherosclerosis (ICD10-I70.0). Electronically Signed   By: Lowella Grip III M.D.   On: 03/06/2017 10:01   Dg Chest Port 1 View  Result Date: 03/05/2017 CLINICAL DATA:  Syncopal episode, defibrillator shocks. EXAM: PORTABLE CHEST 1 VIEW COMPARISON:  Chest radiograph February 04, 2017 FINDINGS: Cardiac silhouette is similarly enlarged. Calcified aortic knob. No  pleural effusion or focal consolidation. No pneumothorax. LEFT defibrillator in situ. Pacer pad over LEFT chest. Multiple EKG lines overlie the patient and may obscure subtle underlying pathology. Soft tissue planes  and included osseous structures are unchanged. IMPRESSION: Stable cardiomegaly.  No acute pulmonary process. Aortic Atherosclerosis (ICD10-I70.0). Electronically Signed   By: Elon Alas M.D.   On: 03/05/2017 00:19    Assessment and Plan:   Principal Problem:   Ventricular tachycardia ([place]) Active Problems:   Left main coronary artery disease   CAD (coronary artery disease)   HTN (hypertension)   Chronic combined systolic and diastolic CHF (congestive heart failure) ([facility])  I was called to discuss patient's severe left main CAD in the setting of severe ischemic cardiomyopathy with a EF that by my evaluation of the LV gram and echocardiogram is probably closer to 25-30% and 35-40% mentioned on echo.  The patient is likely not a very good candidate as indicated by Dr. Everrett Coombe note.  Question would be difficulty with recovery from the surgery in this case. Dr. Caryl Comes is concerned that her ventricular fibrillation is potentially related to this left main disease which is not unreasonable.  Options for her in this case since surgery is not a viable option are medical management versus high risk PCI left main PCI which would require atherectomy and most likely would require invasive cardiac support with an Impella if possible.  I have reviewed the images with Dr. Angelena Form and will discuss with additional interventional colleagues. I am ordering CT Angiogram of the chest abdomen pelvis to evaluate her aorta and iliofemoral arteries to see if she would be a candidate for femoral access Impella.  Once we are able to determine if the Impella would be an option, will then need to schedule this procedure after discussing with interventional colleagues in determining the appropriate plan.  I discussed the entire procedure including the Impella and rotational or orbital atherectomy along with the Impella with the patient, her husband and children.  I did go into detail explaining the benefits of  doing the procedure as well as the risks.  Beyond the standard PCI risk, left main PCI without ischemic cardia myopathy is high risk, however with the presence of ischemic cardiomyopathy and potentially unstable arrhythmias makes it even higher risk.  Until I am able to see feasibility of Impella and discussed with colleagues, I do not feel comfortable giving exact percentages of potential complications however I did site is close to 20% chance of procedure related complications if not mortality.  That being said, the other option is medical management and consideration of potential palliative care  We will continue to review with interventional colleagues and determine the best course of action and plan prior to scheduling this procedure.  My earliest potential time to schedule this procedure would be on Tuesday if all of the prerequisite date is in place.    For questions or updates, please contact [place] Please consult [email or web address] for contact info under Cardiology/STEMI.   Signed, Glenetta Hew, MD  03/07/2017 7:47 PM

## 2017-03-07 NOTE — Progress Notes (Signed)
[address]       [place] [address]             [place] Record #[id number] Date of Birth: 07-12-1940  Referring:Dr  Jens Som  Primary Care: [name], MD  Chief Complaint:   Episodic syncopal episodes and shortness of breath   History of Present Illness:     The patient is a 76 year old female with a complicated cardiac history.  She is transferred from [facility] yesterday after cardiac catheterization for consideration of medical therapy, versus high risk angioplasty versus coronary artery bypass grafting.  The patient has a long cardiac history including a stent placed in 1999.  While in [place] in 2015 she had a large myocardial infarction was in cardiogenic shock had multiple stents placed at, and defibrillator.  She has had 2 episodes recently of syncope and firing of her defibrillator once while in a [employer] in late November.  The patient has significant heart failure symptoms like she becomes much more short of breath at night when laying flat.  She has been on home oxygen for at least 2 years.  She is very limited in her ability to do much.  Both she and her husband agree that she spends more than 75% of the time sitting in the bed.  She does walk around her house to some degree. She denies any chest pain  Seen for syncopal episodes and seizures 02/05/2017 as inpatient   Current Activity/ Functional Status: Patient is independent with mobility/ambulation, transfers, ADL's, IADL's.   Zubrod Score: At this time patient's most appropriate activity status/level should be described as: []     0    Normal activity, no symptoms []     1    Restricted in physical strenuous activity but ambulatory, able to do out light work [x]     2    Ambulatory and capable of self care, unable to do work activities, up and about  Less  more than 25%  Of the time                            []     3     Only limited self care, in bed greater than 50% of waking hours []     4    Completely disabled, no self care, confined to bed or chair []     5    Moribund  Past Medical History:  Diagnosis Date  . Chronic combined systolic and diastolic CHF (congestive heart failure) ([place])   . Diabetes mellitus without complication ([place])   . Hypertension   . MI (myocardial infarction) ([place])    x 5  . Pacemaker     Past Surgical History:  Procedure Laterality Date  . LEFT HEART CATH AND CORONARY ANGIOGRAPHY N/A 03/05/2017   Procedure: LEFT HEART CATH AND CORONARY ANGIOGRAPHY;  Surgeon: Isaias Cowman, MD;  Location: [facility];  Service: Cardiovascular;  Laterality: N/A;  . pacemaker/defibrillator Left     History  Smoking Status  . Never Smoker  Smokeless Tobacco  . Never Used    History  Alcohol Use  . Yes    Comment: occasionally    Social History   Social History  . Marital status: Married    Spouse name: N/A  . Number of children: N/A  . Years of education: N/A  Occupational History  . Not on file.   Social History Main Topics  . Smoking status: Never Smoker  . Smokeless tobacco: Never Used  . Alcohol use Yes     Comment: occasionally  . Drug use: No  . Sexual activity: Not on file   Other Topics Concern  . Not on file   Social History Narrative  . No narrative on file    Allergies  Allergen Reactions  . Celebrex [Celecoxib] Anaphylaxis  . Levaquin [Levofloxacin In D5w] Other (See Comments)    Heart arrhthymias  . Sulfa Antibiotics Other (See Comments)    Reaction: unknown  . Penicillins Rash    Has patient had a PCN reaction causing immediate rash, facial/tongue/throat swelling, SOB or lightheadedness with hypotension: Unknown Has patient had a PCN reaction causing severe rash involving mucus membranes or skin necrosis: No Has patient had a PCN reaction that required hospitalization: No Has patient had a PCN reaction occurring within the last  10 years: No If all of the above answers are "NO", then may proceed with Cephalosporin use.     Current Facility-Administered Medications  Medication Dose Route Frequency Provider Last Rate Last Dose  . acetaminophen (TYLENOL) tablet 500 mg  500 mg Oral QHS Daune Perch, NP   500 mg at 03/06/17 1900  . amiodarone (NEXTERONE PREMIX) 360-4.14 MG/200ML-% (1.8 mg/mL) IV infusion  30 mg/hr Intravenous Continuous Daune Perch, NP 16.7 mL/hr at 03/07/17 0700 30 mg/hr at 03/07/17 0700  . aspirin chewable tablet 81 mg  81 mg Oral Daily Daune Perch, NP      . carvedilol (COREG) tablet 3.125 mg  3.125 mg Oral BID Daune Perch, NP   3.125 mg at 03/06/17 2114  . heparin injection 5,000 Units  5,000 Units Subcutaneous Q8H Daune Perch, NP   5,000 Units at 03/07/17 0533  . Influenza vac split quadrivalent PF (FLUZONE HIGH-DOSE) injection 0.5 mL  0.5 mL Intramuscular Prior to discharge Deboraha Sprang, MD      . insulin aspart (novoLOG) injection 0-15 Units  0-15 Units Subcutaneous TID WC Daune Perch, NP      . insulin aspart (novoLOG) injection 0-5 Units  0-5 Units Subcutaneous QHS Daune Perch, NP      . ipratropium-albuterol (DUONEB) 0.5-2.5 (3) MG/3ML nebulizer solution 3 mL  3 mL Nebulization Q4H PRN Deboraha Sprang, MD      . levothyroxine (SYNTHROID, LEVOTHROID) tablet 50 mcg  50 mcg Oral Daily Daune Perch, NP      . lidocaine (cardiac) IV  infusion 4 mg/mL  1 mg/min Intravenous Continuous Daune Perch, NP 15 mL/hr at 03/07/17 0700 1 mg/min at 03/07/17 0700  . LORazepam (ATIVAN) tablet 0.5 mg  0.5 mg Oral Q6H PRN Daune Perch, NP   0.5 mg at 03/07/17 0533  . MEDLINE mouth rinse  15 mL Mouth Rinse BID Deboraha Sprang, MD   15 mL at 03/06/17 2200  . nitroGLYCERIN (NITROSTAT) SL tablet 0.4 mg  0.4 mg Sublingual Q5 min PRN Daune Perch, NP      . ranolazine (RANEXA) 12 hr tablet 500 mg  500 mg Oral BID Daune Perch, NP   500 mg at 03/06/17 2114  . simvastatin (ZOCOR)  tablet 40 mg  40 mg Oral QHS Daune Perch, NP   40 mg at 03/06/17 2114  . sodium chloride (OCEAN) 0.65 % nasal spray 1 spray  1 spray Each Nare PRN Deboraha Sprang, MD   1 spray at 03/07/17 [phone]  Prescriptions Prior to Admission  Medication Sig Dispense Refill Last Dose  . acetaminophen (TYLENOL) 500 MG tablet Take 500 mg by mouth at bedtime.   03/03/2017 at Unknown time  . albuterol (PROVENTIL HFA;VENTOLIN HFA) 108 (90 Base) MCG/ACT inhaler Inhale 2 puffs into the lungs every 4 (four) hours as needed for wheezing or shortness of breath. 1 Inhaler 1 prn at prn  . amiodarone (NEXTERONE PREMIX) 360-4.14 MG/200ML-% SOLN Inject 33.33 mL/hr into the vein continuous.     . carvedilol (COREG) 3.125 MG tablet Take 3.125 mg by mouth 2 (two) times daily.  6 03/04/2017 at 1000  . FLUoxetine (PROZAC) 20 MG capsule Take 20 mg by mouth 2 (two) times daily.  0 03/04/2017 at Unknown time  . Fluticasone-Salmeterol (ADVAIR DISKUS) 250-50 MCG/DOSE AEPB Inhale 1 puff into the lungs 2 (two) times daily. 1 each 1 03/04/2017 at Unknown time  . levothyroxine (SYNTHROID, LEVOTHROID) 50 MCG tablet Take 50 mcg by mouth daily.  3 03/04/2017 at Unknown time  . losartan (COZAAR) 50 MG tablet Take 50 mg by mouth daily.   03/04/2017 at Unknown time  . Multiple Vitamin (MULTIVITAMIN WITH MINERALS) TABS tablet Take 1 tablet by mouth daily.   03/04/2017 at Unknown time  . niacin 500 MG CR capsule Take 500 mg by mouth 2 (two) times daily with a meal.   03/04/2017 at Unknown time  . nitroGLYCERIN (NITROSTAT) 0.4 MG SL tablet Place 0.4 mg under the tongue every 5 (five) minutes as needed for chest pain.   prn at prn  . omega-3 acid ethyl esters (LOVAZA) 1 g capsule Take 1 g by mouth daily.    03/04/2017 at Unknown time  . simvastatin (ZOCOR) 40 MG tablet Take 40 mg by mouth at bedtime.   2 03/03/2017 at Unknown time    Family History  Problem Relation Age of Onset  . Hypertension Father   . Heart attack Father       Review of Systems:  Pertinent items are noted in HPI.     Cardiac Review of Systems: Y or N  Chest Pain [  n ]  Resting SOB [ y  ] Exertional SOB  [ y ]  Orthopnea [ y ]   Pedal Edema [ y  ]    Palpitations [ y ] Syncope  Blue.Reese  ]   Presyncope [ y  ]  General Review of Systems: [Y] = yes [  ]=no Constitional: recent weight change [n ]; anorexia [  ]; fatigue [  ]; nausea [  ]; night sweats [  ]; fever [  ]; or chills [  ]                                                               Dental: poor dentition[  ]; Last Dentist visit:   Eye : blurred vision [  ]; diplopia [   ]; vision changes [  ];  Amaurosis fugax[  ]; Resp: cough [ y ];  wheezing[ y ];  hemoptysis[n  ]; shortness of breath[y  ]; paroxysmal nocturnal dyspnea[  ]; dyspnea on exertion[  y]; or orthopnea[ y ];  GI:  gallstones[  ], vomiting[  ];  dysphagia[  ]; melena[  ];  hematochezia [  ]; heartburn[  ];  Hx of  Colonoscopy[  ]; GU: kidney stones [  ]; hematuria[  ];   dysuria [  ];  nocturia[  ];  history of     obstruction [  ]; urinary frequency [  ]             Skin: rash, swelling[  ];, hair loss[  ];  peripheral edema[  ];  or itching[  ]; Musculosketetal: myalgias[  ];  joint swelling[  ];  joint erythema[  ];  joint pain[  ];  back pain[  ];  Heme/Lymph: bruising[  ];  bleeding[  ];  anemia[  ];  Neuro: TIA[n  ];  headaches[  ];  stroke[ n ];  vertigo[  ];  seizures[ y ];   paresthesias[  ];  difficulty walking[ y ];  Psych:depression[  ]; anxiety[  ];  Endocrine: diabetes[ y ];  thyroid dysfunction[n  ];  Immunizations: Flu [  ]; Pneumococcal[  ];  Other:  Physical Exam: BP 114/70   Pulse 81   Temp (!) 97.4 F (36.3 C) (Oral)   Resp 18   Ht 5\' 4"  (1.626 m)   Wt 179 lb 7.3 oz (81.4 kg)   SpO2 99%   BMI 30.80 kg/m    General appearance: alert, appears older than stated age, no distress and moderately obese Head: Normocephalic, without obvious abnormality, atraumatic Neck: no adenopathy, no carotid  bruit, no JVD, supple, symmetrical, trachea midline and thyroid not enlarged, symmetric, no tenderness/mass/nodules Lymph nodes: Cervical, supraclavicular, and axillary nodes normal. Resp: diminished breath sounds bibasilar Back: symmetric, no curvature. ROM normal. No CVA tenderness. Cardio: regular rate and rhythm, S1, S2 normal, no murmur, click, rub or gallop GI: soft, non-tender; bowel sounds normal; no masses,  no organomegaly Extremities: extremities normal, atraumatic, no cyanosis or edema and Homans sign is negative, no sign of DVT Neurologic: Grossly normal Patient has healed AICD device left upper chest  Diagnostic Studies & Laboratory data:     Recent Radiology Findings:   Dg Chest Port 1 View  Result Date: 03/06/2017 CLINICAL DATA:  Shortness of Breath EXAM: PORTABLE CHEST 1 VIEW COMPARISON:  March 04, 2017 FINDINGS: There is interstitial edema bilaterally, primarily in the mid and lower lung zones. There is no airspace consolidation. There is cardiomegaly with pulmonary venous hypertension. Pacemaker leads are attached to the right atrium and right ventricle. There is aortic atherosclerosis. There is calcification in the left carotid artery. There is also calcification in the left anterior descending coronary artery. No adenopathy.  No evident bone lesions. IMPRESSION: Cardiomegaly with pulmonary venous hypertension consistent with pulmonary vascular congestion. There is no a degree of interstitial edema. No airspace consolidation. Pacemaker leads are attached to the right atrium and right ventricle. Aortic atherosclerosis. Calcification in left carotid artery. Coronary artery calcification noted. Aortic Atherosclerosis (ICD10-I70.0). Electronically Signed   By: Lowella Grip III M.D.   On: 03/06/2017 10:01   I have independently reviewed the above radiology studies  and reviewed the findings with the patient.    Recent Lab Findings: Lab Results  Component Value Date    WBC 9.0 03/06/2017   HGB 9.8 (L) 03/06/2017   HCT 30.8 (L) 03/06/2017   PLT 204 03/06/2017   GLUCOSE 133 (H) 03/07/2017   ALT 12 (L) 03/04/2017   AST 23 03/04/2017   NA 136 03/07/2017   K 4.9 03/07/2017   CL 101 03/07/2017   CREATININE 1.11 (H) 03/07/2017   BUN 13 03/07/2017   CO2  27 03/07/2017   CATH: 03/05/2017 done at [facility] CATH AND CORONARY ANGIOGRAPHY  Conclusion     Prox LAD to Dist LAD lesion, 0 %stenosed.  Dist LAD lesion, 90 %stenosed.  Prox Cx to Mid Cx lesion, 0 %stenosed.  Ost 2nd Mrg to 2nd Mrg lesion, 50 %stenosed.  Ost LM lesion, 75 %stenosed.  Mid RCA-2 lesion, 30 %stenosed.  Mid RCA-1 lesion, 30 %stenosed.  Prox RCA lesion, 30 %stenosed.   1. Patent stents mid LAD and proximal left circumflex 2. 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 90% stenosis mid to distal LAD 4. Severe, ischemic cardiomyopathy, with LVEF of 30% with anterior apical akinesis  Recommendations  1. Three-vessel CABG, versus complex, high risk PCI ostial left main and distal LAD, versus medical therapy    I have independently reviewed the above  cath films and reviewed the findings with the  patient . Right heart cath not performed, I would suggest that ejection fraction of 30% is an over estimate I would say closer to 20%.   ECHO: 02/05/2017 *[place], [place] [id number]                            [phone]  ------------------------------------------------------------------- Transthoracic Echocardiography  (Report amended )  Patient:    Rhodia, Acres MR #:       [id number] Study Date: 02/05/2017 Gender:     F Age:        43 Height:     162.6 cm Weight:     80.6 kg BSA:        1.93 m^2 Pt. Status: Room:       258A   ADMITTING    Rhett Bannister  REFERRING    Ethlyn Daniels  ATTENDING    Gouru, Aruna  SONOGRAPHER  Sherrie Sport RDCS  PERFORMING   [facility]  cc:  ------------------------------------------------------------------- LV EF: 35% -   40%  ------------------------------------------------------------------- Indications:      (Syncope 780.2).  ------------------------------------------------------------------- History:   PMH:   Myocardial infarction.  Risk factors: Hypertension. Diabetes mellitus.  ------------------------------------------------------------------- Study Conclusions  - Left ventricle: The cavity size was mildly dilated. Wall   thickness was normal. Systolic function was moderately reduced.   The estimated ejection fraction was in the range of 35% to 40%.   Wall motion was normal; there were no regional wall motion   abnormalities. Doppler parameters are consistent with abnormal   left ventricular relaxation (grade 1 diastolic dysfunction). - Mitral valve: There was moderate regurgitation. - Left atrium: The atrium was mildly dilated. - Right ventricle: The cavity size was mildly dilated. Wall   thickness was normal.  Impressions:  - Moderately reduced left ventricular function   inferior apical severe hypokinesis   mild left ventricular enlargement   ejection fraction between 35 and 40%   pacer wire in RV   No cardiac source of emboli was indentified.  ------------------------------------------------------------------- Labs, prior tests, procedures, and surgery: Permanent pacemaker system implantation.  ------------------------------------------------------------------- Study data:   Study status:  Routine.  Procedure:  The patient reported no pain pre or post test.          Transthoracic echocardiography.  M-mode, complete 2D, spectral Doppler, and color Doppler.  Birthdate:  Patient birthdate: Mar 02, 1941.  Age:  Patient is 76 yr old.  Sex:  Gender: female.    BMI: 30.5 kg/m^2.  Blood pressure:      127/70  Patient status:  Inpatient.  Study date: Study date: 02/05/2017. Study time: 11:51 AM.  Location:  Bedside.   -------------------------------------------------------------------  ------------------------------------------------------------------- Left ventricle:  The cavity size was mildly dilated. Wall thickness was normal. Systolic function was moderately reduced. The estimated ejection fraction was in the range of 35% to 40%. Wall motion was normal; there were no regional wall motion abnormalities. Doppler parameters are consistent with abnormal left ventricular relaxation (grade 1 diastolic dysfunction).  ------------------------------------------------------------------- Aortic valve:   Trileaflet; mildly thickened leaflets. Mobility was not restricted. Sclerosis without stenosis.  Doppler: Transvalvular velocity was within the normal range. There was no stenosis. There was no regurgitation.  ------------------------------------------------------------------- Aorta:  The aorta was normal, not dilated, and non-diseased. Aortic root: The aortic root was normal in size.  ------------------------------------------------------------------- Mitral valve:   Mildly thickened leaflets . Mobility was not restricted.  Doppler:  Transvalvular velocity was within the normal range. There was no evidence for stenosis. There was moderate regurgitation.    Valve area by pressure half-time: 5.37 cm^2. Indexed valve area by pressure half-time: 2.78 cm^2/m^2.    Peak gradient (D): 6 mm Hg.  ------------------------------------------------------------------- Left atrium:  The atrium was mildly dilated.  ------------------------------------------------------------------- Atrial septum:  Poorly visualized.  ------------------------------------------------------------------- Right ventricle:  Borderline reduced RV function. The cavity size was mildly dilated. Wall thickness was  normal. Pacer wire or catheter noted in right ventricle. Systolic function was normal.   ------------------------------------------------------------------- Pulmonic valve:    Structurally normal valve.   Cusp separation was normal.  Doppler:  Transvalvular velocity was within the normal range. There was no evidence for stenosis. There was no regurgitation.  ------------------------------------------------------------------- Tricuspid valve:   Mildly thickened leaflets.  Doppler: Transvalvular velocity was within the normal range. There was mild regurgitation.  ------------------------------------------------------------------- Pulmonary artery:   Poorly visualized. The main pulmonary artery was normal-sized. Systolic pressure was within the normal range.   ------------------------------------------------------------------- Right atrium:  The atrium was at the upper limits of normal in size.  ------------------------------------------------------------------- Pericardium:  Not well visualized. The pericardium was normal in appearance. There was no pericardial effusion.  ------------------------------------------------------------------- Systemic veins: Inferior vena cava: The vessel was normal in size.  ------------------------------------------------------------------- Post procedure conclusions Ascending Aorta:  - The aorta was normal, not dilated, and non-diseased.  ------------------------------------------------------------------- Measurements   Left ventricle                           Value          Reference  LV ID, ED, PLAX chordal          (H)     63.4  mm       43 - 52  LV ID, ES, PLAX chordal          (H)     51.1  mm       23 - 38  LV fx shortening, PLAX chordal   (L)     19    %        >=29  LV PW thickness, ED  15.7  mm       ----------  IVS/LV PW ratio, ED                      0.56           <=1.3  LV ejection fraction, 1-p A4C             40    %        ----------  LV end-diastolic volume, 2-p             165   ml       ----------  LV end-systolic volume, 2-p              100   ml       ----------  LV ejection fraction, 2-p                39    %        ----------  Stroke volume, 2-p                       65    ml       ----------  LV end-diastolic volume/bsa, 2-p         85    ml/m^2   ----------  LV end-systolic volume/bsa, 2-p          52    ml/m^2   ----------  Stroke volume/bsa, 2-p                   33.6  ml/m^2   ----------  LV e&', lateral                           3.26  cm/s     ----------  LV E/e&', lateral                         38.04          ----------  LV e&', medial                            4.46  cm/s     ----------  LV E/e&', medial                          27.8           ----------  LV e&', average                           3.86  cm/s     ----------  LV E/e&', average                         32.12          ----------    Ventricular septum                       Value          Reference  IVS thickness, ED                        8.78  mm       ----------    LVOT  Value          Reference  LVOT ID, S                               20    mm       ----------  LVOT area                                3.14  cm^2     ----------    Aorta                                    Value          Reference  Aortic root ID, ED                       28    mm       ----------    Left atrium                              Value          Reference  LA ID, A-P, ES                           54    mm       ----------  LA ID/bsa, A-P                   (H)     2.79  cm/m^2   <=2.2  LA volume, ES, 1-p A4C                   65.6  ml       ----------  LA volume/bsa, ES, 1-p A4C               33.9  ml/m^2   ----------    Mitral valve                             Value          Reference  Mitral E-wave peak velocity              124   cm/s     ----------  Mitral A-wave peak velocity               67.9  cm/s     ----------  Mitral deceleration time         (L)     141   ms       150 - 230  Mitral pressure half-time                41    ms       ----------  Mitral peak gradient, D                  6     mm Hg    ----------  Mitral E/A ratio, peak                   1.8            ----------  Mitral valve  area, PHT, DP               5.37  cm^2     ----------  Mitral valve area/bsa, PHT, DP           2.78  cm^2/m^2 ----------    Right atrium                             Value          Reference  RA ID, S-I, ES, A4C              (H)     61.4  mm       34 - 49  RA area, ES, A4C                 (H)     23.1  cm^2     8.3 - 19.5  RA volume, ES, A/L                       68.6  ml       ----------  RA volume/bsa, ES, A/L                   35.5  ml/m^2   ----------    Right ventricle                          Value          Reference  RV ID, ED, PLAX                          36    mm       19 - 38  TAPSE                                    32.1  mm       ----------  RV s&', lateral, S                        10.8  cm/s     ----------    Pulmonic valve                           Value          Reference  Pulmonic valve peak velocity, S          42.2  cm/s     ----------  Legend: (L)  and  (H)  mark values outside specified reference range.  ------------------------------------------------------------------- Ricke Hey, MD [id number]:[id number]:35  Assessment / Plan:   Assuming no new 70% left main obstruction with calcification in a patient with overall poor LV function, poor overall functional capacity likely due to congestive heart failure and also significant underlying pulmonary disease, on home oxygen for the past 2 years.  With the patient's history of long-term smoking, including currently, poor functional status and poor LV function, coronary artery bypass grafting carries a very significant risk of poor functional return and recovery from surgery.  High risk left main  stenting could be considered, but this obvious has its issues also with likely would need to be an atherectomy.  I have explained to the patient and her husband that neither  of these approaches are likely to change her overall functional capacity or respiratory status much in the long-term.      I  spent 40 minutes counseling the patient face to face and 50% or more the  time was spent in counseling and coordination of care. The total time spent in the appointment was 60 minutes.    Grace Isaac MD      [address] [place], [address] Office [place] [phone]  03/07/2017 8:07 AM     Patient ID: ALISHAH SCHULTE, female   DOB: 06-20-1940, 76 y.o.   MRN: [id number]

## 2017-03-07 NOTE — Progress Notes (Signed)
Progress Note  Patient Name: AICIA BABINSKI Date of Encounter: 03/07/2017  Primary Cardiologist: AP  Primary Electrophysiologist: SK   Patient Profile     76 y.o. female admitted in transfer from [facility] because of ventricular tachycardia-recurrent monomorphic and polymorphic in the context of progressive coronary disease with 75% plus left main in the context of known left ventricular dysfunction oxygen-dependent COPD for consideration of revascularization  Subjective   Short of breath this morning but without intercurrent ventricular tachycardia  Inpatient Medications    Scheduled Meds: . acetaminophen  500 mg Oral QHS  . aspirin  81 mg Oral Daily  . carvedilol  3.125 mg Oral BID  . heparin  5,000 Units Subcutaneous Q8H  . insulin aspart  0-15 Units Subcutaneous TID WC  . insulin aspart  0-5 Units Subcutaneous QHS  . levothyroxine  50 mcg Oral Daily  . mouth rinse  15 mL Mouth Rinse BID  . ranolazine  500 mg Oral BID  . simvastatin  40 mg Oral QHS   Continuous Infusions: . amiodarone 30 mg/hr (03/07/17 0700)  . lidocaine 1 mg/min (03/07/17 0700)   PRN Meds: Influenza vac split quadrivalent PF, ipratropium-albuterol, LORazepam, nitroGLYCERIN, sodium chloride   Vital Signs    Vitals:   03/07/17 1000 03/07/17 1100 03/07/17 1135 03/07/17 1300  BP: 110/68 116/71 116/71 107/64  Pulse: 80 79 80 80  Resp: 19 19 15 17   Temp:   98.2 F (36.8 C)   TempSrc:   Oral   SpO2: 96% 95% 97% 95%  Weight:      Height:        Intake/Output Summary (Last 24 hours) at 03/07/17 1404 Last data filed at 03/07/17 1300  Gross per 24 hour  Intake           636.88 ml  Output             2125 ml  Net         -1488.12 ml   Filed Weights   03/06/17 1730  Weight: 179 lb 7.3 oz (81.4 kg)    Telemetry    No interval  ventricular tachycardia - Personally Reviewed  ECG     p  Physical Exam    GEN:  mild shortness of breath Neck: JVD 8-10 cm Cardiac: RRR, no  murmurs, rubs,  or gallops.  Respiratory: Clear to auscultation bilaterally. GI: Soft, nontender, non-distended  MS: tr edema; No deformity. Neuro:  Nonfocal  Psych: Normal affect  Skin Warm and dry   Labs    Chemistry Recent Labs Lab 03/04/17 2247 03/05/17 0541 03/06/17 0434 03/06/17 1859 03/07/17 0206  NA 139 139 140  --  136  K 3.5 3.2* 3.3*  --  4.9  CL 101 102 103  --  101  CO2 24 27 27   --  27  GLUCOSE 180* 109* 130*  --  133*  BUN 23* 20 17  --  13  CREATININE 1.32* 1.11* 1.06* 1.00 1.11*  CALCIUM 9.6 8.7* 8.6*  --  8.9  PROT 7.9  --   --   --   --   ALBUMIN 3.9  --   --   --   --   AST 23  --   --   --   --   ALT 12*  --   --   --   --   ALKPHOS 73  --   --   --   --   BILITOT 0.8  --   --   --   --  GFRNONAA 38* 47* 50* 54* 47*  GFRAA 45* 55* 58* >60 55*  ANIONGAP 14 10 10   --  8     Hematology Recent Labs Lab 03/04/17 2247 03/05/17 0541 03/06/17 1859  WBC 11.7* 7.1 9.0  RBC 3.72* 3.45* 3.43*  HGB 10.9* 9.9* 9.8*  HCT 32.6* 30.1* 30.8*  MCV 87.8 87.3 89.8  MCH 29.2 28.7 28.6  MCHC 33.3 32.9 31.8  RDW 18.3* 18.2* 17.7*  PLT 248 193 204    Cardiac Enzymes Recent Labs Lab 03/04/17 2247 03/05/17 0541 03/05/17 1039 03/05/17 1715  TROPONINI <0.03 0.30* 0.25* 0.16*   No results for input(s): TROPIPOC in the last 168 hours.   BNP Recent Labs Lab 03/04/17 2247  BNP 833.0*     DDimer No results for input(s): DDIMER in the last 168 hours.   Radiology    Dg Chest Port 1 View  Result Date: 03/06/2017 CLINICAL DATA:  Shortness of Breath EXAM: PORTABLE CHEST 1 VIEW COMPARISON:  March 04, 2017 FINDINGS: There is interstitial edema bilaterally, primarily in the mid and lower lung zones. There is no airspace consolidation. There is cardiomegaly with pulmonary venous hypertension. Pacemaker leads are attached to the right atrium and right ventricle. There is aortic atherosclerosis. There is calcification in the left carotid artery. There is also calcification in  the left anterior descending coronary artery. No adenopathy.  No evident bone lesions. IMPRESSION: Cardiomegaly with pulmonary venous hypertension consistent with pulmonary vascular congestion. There is no a degree of interstitial edema. No airspace consolidation. Pacemaker leads are attached to the right atrium and right ventricle. Aortic atherosclerosis. Calcification in left carotid artery. Coronary artery calcification noted. Aortic Atherosclerosis (ICD10-I70.0). Electronically Signed   By: Lowella Grip III M.D.   On: 03/06/2017 10:01    Cardiac Studies      Device Interrogation        Assessment & Plan   Ventricular tachycardia monomorphic  Ventricular tachycardia-polymorphic-torsades  Ischemic cardiomyopathy with 75% left main left ventricular dysfunction  Oxygen-dependent COPD  Congestive heart failure-acute on chronic systolic  Diabetes    Patient has ventricular tachycardia initially monomorphic coming in a cluster. The issue has been the triggering mechanism of this. Electromechanical interaction certainly could do this. In the context of her worsening coronary disease, having reviewed the movies with Dr. Billee Cashing, chronic ischemia is more likely to be issues of acute ischemia which could change diastolic potentials and gives rise to the monomorphic ventricular tachycardia. Polymorphic ventricular tachycardia could be acutely ischemia with secondary QT prolongation. As mentioned not yesterday, I also worry that amiodarone may be pro arrhythmic although the last 18 hours of quiescence speaks against that although this affect could be attenuated by the presence of lidocaine.  I have spoken with Dr. Faye Ramsay who we will arrange for CT surgery consultation. I have spoken with Dr. Shelby Mattocks will assume primary care this week and Dr. Billee Cashing will assume primary cardiac care next week   We'll check a lidocaine level.>>>2.4      Virl Axe, MD  03/07/2017, 2:04 PM

## 2017-03-07 NOTE — Progress Notes (Signed)
Attempted bedside pft. Pt was asleep and family stated she had just had sleeping medicine. Family asked to have pft done at a later time.

## 2017-03-07 NOTE — H&P (Signed)
Patient ID: AZILEE PIRRO MRN: [id number], DOB/AGE: 1940-05-16   Admit date: 03/06/2017   Primary Physician: [name], MD Primary Cardiologist: Paraschos  Pt. Profile:  Heather Peterson is a 76 y.o. female with a history of CAD, chronic systolic CHF, VT/VF s/p ICD, DM, oxygen dependent COPD and ongoing tobacco abuse Transferred from [facility] for possible surgical intervention.   She has a history of ischemic heart disease with prior MI and 9 stenting of her circumflex in 2013 recurrent MI 2831 complicated by cardiac arrest and stenting of her LAD. LVEF 35-40% 9/18. On 9/18 she also had an ICD shock . Report describes ventricular fibrillation?Marland Kitchen She was started on amiodarone 200 mg twice a day 1 week. She is also discharged on carvedilol Plavix Cozaar and modest dose statins.   She also has a history of an ICD discharge in the context of Levaquin. They recall being told that it was an interaction with fluoxetine.   HPI: As above. Admitted to [facility] 03/04/17 after multiple syncopal episodes with several ICD delivered shocks. Interrogation revealed 13 episodes of ventricular tachycardia and ventricular fibrillation, one episode of DVT required further delivered shocks, and an episode of ventricular fibrillation requiring 3 delivered shocks. The patient was started on amiodarone bolus and drip has had no recurrent episodes.initial troponin was less than 0.03. Follow-up troponin was 0.30. Cath showed patent stents mid LAD and proximal left circumflex. However 75% ostial left main and 90% m/dLAD. Revascularization has been suggested. Transferred to [facility] for complex PCI vs CABG.   Problem List  Past Medical History:  Diagnosis Date  . Chronic combined systolic and diastolic CHF (congestive heart failure) ([place])   . Diabetes mellitus without complication ([place])   . Hypertension   . MI (myocardial infarction) ([place])    x 5  . Pacemaker     Past Surgical History:    Procedure Laterality Date  . LEFT HEART CATH AND CORONARY ANGIOGRAPHY N/A 03/05/2017   Procedure: LEFT HEART CATH AND CORONARY ANGIOGRAPHY;  Surgeon: Isaias Cowman, MD;  Location: [facility];  Service: Cardiovascular;  Laterality: N/A;  . pacemaker/defibrillator Left      Allergies  Allergies  Allergen Reactions  . Celebrex [Celecoxib] Anaphylaxis  . Levaquin [Levofloxacin In D5w] Other (See Comments)    Heart arrhthymias  . Sulfa Antibiotics Other (See Comments)    Reaction: unknown  . Penicillins Rash    Has patient had a PCN reaction causing immediate rash, facial/tongue/throat swelling, SOB or lightheadedness with hypotension: Unknown Has patient had a PCN reaction causing severe rash involving mucus membranes or skin necrosis: No Has patient had a PCN reaction that required hospitalization: No Has patient had a PCN reaction occurring within the last 10 years: No If all of the above answers are "NO", then may proceed with Cephalosporin use.      Home Medications  Prior to Admission medications   Medication Sig Start Date End Date Taking? Authorizing Provider  acetaminophen (TYLENOL) 500 MG tablet Take 500 mg by mouth at bedtime.    [provider]  albuterol (PROVENTIL HFA;VENTOLIN HFA) 108 (90 Base) MCG/ACT inhaler Inhale 2 puffs into the lungs every 4 (four) hours as needed for wheezing or shortness of breath. 02/05/17   Nicholes Mango, MD  amiodarone (NEXTERONE PREMIX) 360-4.14 MG/200ML-% SOLN Inject 33.33 mL/hr into the vein continuous. 03/06/17   Dustin Flock, MD  carvedilol (COREG) 3.125 MG tablet Take 3.125 mg by mouth 2 (two) times daily. 12/30/16  [provider]  FLUoxetine (PROZAC) 20 MG capsule Take 20 mg by mouth 2 (two) times daily. 10/28/16   [provider]  Fluticasone-Salmeterol (ADVAIR DISKUS) 250-50 MCG/DOSE AEPB Inhale 1 puff into the lungs 2 (two) times daily. 02/05/17 02/05/18  Nicholes Mango, MD  levothyroxine  (SYNTHROID, LEVOTHROID) 50 MCG tablet Take 50 mcg by mouth daily. 12/13/16   [provider]  losartan (COZAAR) 50 MG tablet Take 50 mg by mouth daily.    [provider]  Multiple Vitamin (MULTIVITAMIN WITH MINERALS) TABS tablet Take 1 tablet by mouth daily.    [provider]  niacin 500 MG CR capsule Take 500 mg by mouth 2 (two) times daily with a meal.    [provider]  nitroGLYCERIN (NITROSTAT) 0.4 MG SL tablet Place 0.4 mg under the tongue every 5 (five) minutes as needed for chest pain.    [provider]  omega-3 acid ethyl esters (LOVAZA) 1 g capsule Take 1 g by mouth daily.     [provider]  simvastatin (ZOCOR) 40 MG tablet Take 40 mg by mouth at bedtime.     [provider]    Family History  Family History  Problem Relation Age of Onset  . Hypertension Father   . Heart attack Father    Family Status  Relation Status  . Father (Not Specified)    Social History  Social History   Social History  . Marital status: Married    Spouse name: N/A  . Number of children: N/A  . Years of education: N/A   Occupational History  . Not on file.   Social History Main Topics  . Smoking status: Never Smoker  . Smokeless tobacco: Never Used  . Alcohol use Yes     Comment: occasionally  . Drug use: No  . Sexual activity: Not on file   Other Topics Concern  . Not on file   Social History Narrative  . No narrative on file      All other systems reviewed and are otherwise negative except as noted above.  Physical Exam  Blood pressure 118/73, pulse 81, temperature 97.9 F (36.6 C), temperature source Oral, resp. rate 18, height 5' 4"  (1.626 m), weight 179 lb 7.3 oz (81.4 kg), SpO2 95 %.  General: Pleasant, NAD Psych: Normal affect. Neuro: Alert and oriented X 3. Moves all extremities spontaneously. HEENT: Normal  Neck: Supple without bruits or JVD. Lungs:  Resp regular and unlabored, CTA. Heart: RRR no  s3, s4, or murmurs. Abdomen: Soft, non-tender, non-distended, BS + x 4.  Extremities: No clubbing, cyanosis or edema. DP/PT/Radials 2+ and equal bilaterally.  Labs   Recent Labs  03/04/17 2247 03/05/17 0541 03/05/17 1039 03/05/17 1715  TROPONINI <0.03 0.30* 0.25* 0.16*   Lab Results  Component Value Date   WBC 9.0 03/06/2017   HGB 9.8 (L) 03/06/2017   HCT 30.8 (L) 03/06/2017   MCV 89.8 03/06/2017   PLT 204 03/06/2017    Recent Labs Lab 03/04/17 2247  03/07/17 0206  NA 139  < > 136  K 3.5  < > 4.9  CL 101  < > 101  CO2 24  < > 27  BUN 23*  < > 13  CREATININE 1.32*  < > 1.11*  CALCIUM 9.6  < > 8.9  PROT 7.9  --   --   BILITOT 0.8  --   --   ALKPHOS 73  --   --   ALT  12*  --   --   AST 23  --   --   GLUCOSE 180*  < > 133*  < > = values in this interval not displayed. No results found for: CHOL, HDL, LDLCALC, TRIG No results found for: DDIMER   Radiology/Studies  Dg Chest Port 1 View  Result Date: 03/06/2017 CLINICAL DATA:  Shortness of Breath EXAM: PORTABLE CHEST 1 VIEW COMPARISON:  March 04, 2017 FINDINGS: There is interstitial edema bilaterally, primarily in the mid and lower lung zones. There is no airspace consolidation. There is cardiomegaly with pulmonary venous hypertension. Pacemaker leads are attached to the right atrium and right ventricle. There is aortic atherosclerosis. There is calcification in the left carotid artery. There is also calcification in the left anterior descending coronary artery. No adenopathy.  No evident bone lesions. IMPRESSION: Cardiomegaly with pulmonary venous hypertension consistent with pulmonary vascular congestion. There is no a degree of interstitial edema. No airspace consolidation. Pacemaker leads are attached to the right atrium and right ventricle. Aortic atherosclerosis. Calcification in left carotid artery. Coronary artery calcification noted. Aortic Atherosclerosis (ICD10-I70.0). Electronically Signed   By: Lowella Grip III M.D.   On: 03/06/2017 10:01   Dg Chest Port 1 View  Result Date: 03/05/2017 CLINICAL DATA:  Syncopal episode, defibrillator shocks. EXAM: PORTABLE CHEST 1 VIEW COMPARISON:  Chest radiograph February 04, 2017 FINDINGS: Cardiac silhouette is similarly enlarged. Calcified aortic knob. No pleural effusion or focal consolidation. No pneumothorax. LEFT defibrillator in situ. Pacer pad over LEFT chest. Multiple EKG lines overlie the patient and may obscure subtle underlying pathology. Soft tissue planes and included osseous structures are unchanged. IMPRESSION: Stable cardiomegaly.  No acute pulmonary process. Aortic Atherosclerosis (ICD10-I70.0). Electronically Signed   By: Elon Alas M.D.   On: 03/05/2017 00:19    ECG  03/04/17- A paced rhythm - personally reviewd  Cath 03/05/17 LEFT HEART CATH AND CORONARY ANGIOGRAPHY  Conclusion     Prox LAD to Dist LAD lesion, 0 %stenosed.  Dist LAD lesion, 90 %stenosed.  Prox Cx to Mid Cx lesion, 0 %stenosed.  Ost 2nd Mrg to 2nd Mrg lesion, 50 %stenosed.  Ost LM lesion, 75 %stenosed.  Mid RCA-2 lesion, 30 %stenosed.  Mid RCA-1 lesion, 30 %stenosed.  Prox RCA lesion, 30 %stenosed.   1. Patent stents mid LAD and proximal left circumflex 2. 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 90% stenosis mid to distal LAD 4. Severe, ischemic cardiomyopathy, with LVEF of 30% with anterior apical akinesis  Recommendations  1. Three-vessel CABG, versus complex, high risk PCI ostial left main and distal LAD, versus medical therapy    ASSESSMENT AND PLAN  1. CAD - Cath as above. 75% ostial left main and 90% m/dLAD. Pending evaluation for TCTS.  - Continue ASA, zocor and renexa.   2. Chronic combined CHF - Continue coreg. Consider resumption of home Losartan. Volume status relatively stable. Just received IV lasix 47m x 1.   3. VT - Followed by EP. On  IV amiodarone and lidocaine.   4. HTN - Stable on  current medication  5. HLD - Continue statin  Signed, Bhagat,Bhavinkumar, PA-C 03/07/2017, 10:53 AM Pager [phone]  I have seen and examined the patient along with Bhagat,Bhavinkumar, PA-C.  I have reviewed the chart, notes and new data.  I agree with PA's note.  Key new complaints: No new defibrillator shocks since last night, continues to have dyspnea.  Denies angina Key examination changes: Without overt hypervolemia on physical exam.  Just received diuretics.  No significant arrhythmia on telemetry.  Defibrillator pocket appears healthy. Key new findings / data: Reviewed angiograms, defibrillator download,  PLAN: Difficult situation with high-grade ostial left main stenosis that is heavily calcified, in a patient that is a poor candidate for surgical revascularization due to chronic respiratory insufficiency and depressed left ventricular systolic function, also a very risky candidate for percutaneous revascularization due to the location and calcification of the lesion. Pending surgical evaluation/decision.  She has been on clopidogrel and surgery would be delayed by at least 5 days anyway. Arrhythmias currently quiescent on intravenous amiodarone and intravenous lidocaine.  She is also receiving ranolazine.  Sanda Klein, MD, [place] [phone] 03/07/2017, 7:02 PM

## 2017-03-08 LAB — GLUCOSE, CAPILLARY
Glucose-Capillary: 109 mg/dL — ABNORMAL HIGH (ref 65–99)
Glucose-Capillary: 164 mg/dL — ABNORMAL HIGH (ref 65–99)
Glucose-Capillary: 115 mg/dL — ABNORMAL HIGH (ref 65–99)
Glucose-Capillary: 124 mg/dL — ABNORMAL HIGH (ref 65–99)

## 2017-03-08 NOTE — Progress Notes (Signed)
Progress Note  Patient Name: Heather Peterson Date of Encounter: 03/08/2017  Primary Cardiologist: AP  Primary Electrophysiologist: SK   Patient Profile     76 y.o. female admitted in transfer from [facility] because of ventricular tachycardia-recurrent monomorphic and polymorphic in the context of progressive coronary disease with 75% plus left main in the context of known left ventricular dysfunction oxygen-dependent COPD for consideration of revascularization  Subjective   SOB improved today with addition of ativan. No further V arrhythmias overnight.   Inpatient Medications    Scheduled Meds: . acetaminophen  500 mg Oral QHS  . aspirin  81 mg Oral Daily  . carvedilol  3.125 mg Oral BID  . furosemide  40 mg Intravenous BID  . heparin  5,000 Units Subcutaneous Q8H  . insulin aspart  0-15 Units Subcutaneous TID WC  . insulin aspart  0-5 Units Subcutaneous QHS  . levothyroxine  50 mcg Oral Daily  . mouth rinse  15 mL Mouth Rinse BID  . ranolazine  500 mg Oral BID  . simvastatin  40 mg Oral QHS   Continuous Infusions: . amiodarone 30 mg/hr (03/08/17 0534)  . lidocaine 1 mg/min (03/08/17 0110)   PRN Meds: Influenza vac split quadrivalent PF, ipratropium-albuterol, LORazepam, nitroGLYCERIN, sodium chloride   Vital Signs    Vitals:   03/08/17 0400 03/08/17 0500 03/08/17 0600 03/08/17 0700  BP: 114/79 121/87 119/81   Pulse: 81 80 80   Resp: 15 (!) 32 (!) 23   Temp:    98.1 F (36.7 C)  TempSrc:    Oral  SpO2: 95% 95% 97%   Weight:      Height:        Intake/Output Summary (Last 24 hours) at 03/08/17 0753 Last data filed at 03/08/17 0600  Gross per 24 hour  Intake            993.1 ml  Output             2975 ml  Net          -1981.9 ml   Filed Weights   03/06/17 1730  Weight: 179 lb 7.3 oz (81.4 kg)    Telemetry    A paced, no V tach - Personally Reviewed  ECG    A paced - personally reviewed  Physical Exam    GEN: Well nourished, well developed,  in no acute distress  HEENT: normal  Neck: no JVD, carotid bruits, or masses Cardiac: RRR; no murmurs, rubs, or gallops,no edema  Respiratory:  clear to auscultation bilaterally, normal work of breathing GI: soft, nontender, nondistended, + BS MS: no deformity or atrophy  Skin: warm and dry, device site well healed Neuro:  Strength and sensation are intact Psych: euthymic mood, full affect   Labs    Chemistry  Recent Labs Lab 03/04/17 2247 03/05/17 0541 03/06/17 0434 03/06/17 1859 03/07/17 0206  NA 139 139 140  --  136  K 3.5 3.2* 3.3*  --  4.9  CL 101 102 103  --  101  CO2 24 27 27   --  27  GLUCOSE 180* 109* 130*  --  133*  BUN 23* 20 17  --  13  CREATININE 1.32* 1.11* 1.06* 1.00 1.11*  CALCIUM 9.6 8.7* 8.6*  --  8.9  PROT 7.9  --   --   --   --   ALBUMIN 3.9  --   --   --   --   AST 23  --   --   --   --  ALT 12*  --   --   --   --   ALKPHOS 73  --   --   --   --   BILITOT 0.8  --   --   --   --   GFRNONAA 38* 47* 50* 54* 47*  GFRAA 45* 55* 58* >60 55*  ANIONGAP 14 10 10   --  8     Hematology  Recent Labs Lab 03/04/17 2247 03/05/17 0541 03/06/17 1859  WBC 11.7* 7.1 9.0  RBC 3.72* 3.45* 3.43*  HGB 10.9* 9.9* 9.8*  HCT 32.6* 30.1* 30.8*  MCV 87.8 87.3 89.8  MCH 29.2 28.7 28.6  MCHC 33.3 32.9 31.8  RDW 18.3* 18.2* 17.7*  PLT 248 193 204    Cardiac Enzymes  Recent Labs Lab 03/04/17 2247 03/05/17 0541 03/05/17 1039 03/05/17 1715  TROPONINI <0.03 0.30* 0.25* 0.16*   No results for input(s): TROPIPOC in the last 168 hours.   BNP  Recent Labs Lab 03/04/17 2247  BNP 833.0*     DDimer No results for input(s): DDIMER in the last 168 hours.   Radiology    Dg Chest Port 1 View  Result Date: 03/06/2017 CLINICAL DATA:  Shortness of Breath EXAM: PORTABLE CHEST 1 VIEW COMPARISON:  March 04, 2017 FINDINGS: There is interstitial edema bilaterally, primarily in the mid and lower lung zones. There is no airspace consolidation. There is cardiomegaly  with pulmonary venous hypertension. Pacemaker leads are attached to the right atrium and right ventricle. There is aortic atherosclerosis. There is calcification in the left carotid artery. There is also calcification in the left anterior descending coronary artery. No adenopathy.  No evident bone lesions. IMPRESSION: Cardiomegaly with pulmonary venous hypertension consistent with pulmonary vascular congestion. There is no a degree of interstitial edema. No airspace consolidation. Pacemaker leads are attached to the right atrium and right ventricle. Aortic atherosclerosis. Calcification in left carotid artery. Coronary artery calcification noted. Aortic Atherosclerosis (ICD10-I70.0). Electronically Signed   By: Lowella Grip III M.D.   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 Per prior discussions, Joy Haegele plan to stop amiodarone today and continue lidocaine and ranexa. If VT recurs, Camey Edell plan to restart amiodarone.  Has had many discussions about revascularization. As per surgery note, is a poor surgical candidate. Jillian Warth potentially go for high risk stenting of the left main lesion with impella support next week which may help improve VT burden as VT is possibly ischemia driven.   Lidocaine level 2.4 yesterday, no changes.  Margarit Minshall Meredith Leeds, MD  03/08/2017, 7:53 AM

## 2017-03-08 NOTE — Evaluation (Signed)
Physical Therapy Evaluation Patient Details Name: Heather Peterson MRN: [id number] DOB: 1940-09-01 Today's Date: 03/08/2017   History of Present Illness  Heather Peterson is a 76 y.o. female with a hx of severe CAD, Ischemic CM with Left Main CAD & Recurrent Ventricular Tachycardia and severe COPD who is being seen for the evaluation of severe left main CAD. Options being discussed are CABG vs PCI vs medical management.  Clinical Impression  Pt admitted with above diagnosis. Pt currently with functional limitations due to the deficits listed below (see PT Problem List). Pt ambulated 40' without AD but was unsteady with multiple LOB. Chair taken right behind pt as pt was anxious about potential for ICD going off. Due to this anxiety, she has been requesting Ativan which is making her groggy and likely contributing to her balance deficits. Recommend use of RW next she ambulates if she is on Ativan.  Pt will benefit from skilled PT to increase their independence and safety with mobility to allow discharge to the venue listed below.       Follow Up Recommendations No PT follow up. Note: expect this to change if pt undergoes procedure/ surgery    Equipment Recommendations  Rolling walker with 5" wheels    Recommendations for Other Services       Precautions / Restrictions Precautions Precautions: Fall Precaution Comments: has had multiple ICD firings and passes out each time Restrictions Weight Bearing Restrictions: No      Mobility  Bed Mobility Overal bed mobility: Modified Independent             General bed mobility comments: needed increased time to get to EOB but was able to accomplish without assist  Transfers Overall transfer level: Needs assistance Equipment used: None Transfers: Sit to/from Stand Sit to Stand: Min assist;+2 safety/equipment         General transfer comment: pt slightly unsteady  Ambulation/Gait Ambulation/Gait assistance: Min assist;+2  safety/equipment Ambulation Distance (Feet): 40 Feet Assistive device: None Gait Pattern/deviations: Staggering left;Staggering right Gait velocity: decreased Gait velocity interpretation: <1.8 ft/sec, indicative of risk for recurrent falls General Gait Details: pt with LOB x2 and reported several times that she felt woozy. Chair brought directly behind pt for safety since she has 3 IDC firings in one day since admission and to decreased her anxiety  Stairs            Wheelchair Mobility    Modified Rankin (Stroke Patients Only)       Balance Overall balance assessment: Needs assistance Sitting-balance support: No upper extremity supported Sitting balance-Leahy Scale: Good     Standing balance support: No upper extremity supported Standing balance-Leahy Scale: Fair Standing balance comment: pt can maintain static stance but LOB during ambulation as well as when turning to look back at chair to sit                             Pertinent Vitals/Pain Pain Assessment: No/denies pain    Home Living Family/patient expects to be discharged to:: Private residence Living Arrangements: Children;Spouse/significant other Available Help at Discharge: Family;Available 24 hours/day Type of Home: House Home Access: Stairs to enter   [employer] of Steps: 1 Home Layout: Multi-level;Able to live on main level with bedroom/bathroom Home Equipment: None Additional Comments: pt and her husband currently live with son and daughter in law. Planning to find a house on their own at some point    Prior Function  Level of Independence: Independent         Comments: walking distance limited by dyspnea but pt does leave house with family with portable tanks     Hand Dominance   Dominant Hand: Right    Extremity/Trunk Assessment   Upper Extremity Assessment Upper Extremity Assessment: Overall WFL for tasks assessed    Lower Extremity Assessment Lower  Extremity Assessment: Generalized weakness    Cervical / Trunk Assessment Cervical / Trunk Assessment: Normal  Communication   Communication: No difficulties  Cognition Arousal/Alertness: Lethargic;Suspect due to medications Behavior During Therapy: [facility] for tasks assessed/performed Overall Cognitive Status: History of cognitive impairments - at baseline                                 General Comments: pt recently received Ativan and is groggy and a bit slow to respond. She has quite a bit of anxiety about ICD firing and keeps asking for the Ativan.        General Comments General comments (skin integrity, edema, etc.): HR 80 bpm, O2 sats 96% on 2L O2, BP 114/75 Pt wanted to get back in bed after walking but her breakfast had just arrived and encouraged her to sit in chair to eat. She also had Purewick upon PT arrival. Spoke with nursing about helping her to bathroom or BSC to increase mobility in small increments.     Exercises     Assessment/Plan    PT Assessment Patient needs continued PT services  PT Problem List Decreased strength;Decreased activity tolerance;Decreased balance;Decreased mobility;Decreased coordination;Decreased knowledge of use of DME       PT Treatment Interventions DME instruction;Gait training;Functional mobility training;Therapeutic activities;Therapeutic exercise;Balance training;Patient/family education    PT Goals (Current goals can be found in the Care Plan section)  Acute Rehab PT Goals Patient Stated Goal: return home PT Goal Formulation: With patient Time For Goal Achievement: 03/22/17 Potential to Achieve Goals: Good    Frequency Min 3X/week   Barriers to discharge        Co-evaluation               AM-PAC PT "6 Clicks" Daily Activity  Outcome Measure Difficulty turning over in bed (including adjusting bedclothes, sheets and blankets)?: A Little Difficulty moving from lying on back to sitting on the side of the bed?  : A Little Difficulty sitting down on and standing up from a chair with arms (e.g., wheelchair, bedside commode, etc,.)?: A Little Help needed moving to and from a bed to chair (including a wheelchair)?: A Little Help needed walking in hospital room?: A Little Help needed climbing 3-5 steps with a railing? : A Lot 6 Click Score: 17    End of Session Equipment Utilized During Treatment: Gait belt Activity Tolerance: Patient tolerated treatment well Patient left: in chair;with call bell/phone within reach Nurse Communication: Mobility status PT Visit Diagnosis: Unsteadiness on feet (R26.81);Muscle weakness (generalized) (M62.81)    Time: 2542-7062 PT Time Calculation (min) (ACUTE ONLY): 25 min   Charges:   PT Evaluation $PT Eval Moderate Complexity: 1 Mod PT Treatments $Gait Training: 8-22 mins   PT G Codes:        Leighton Roach, PT  Acute Rehab Services  [place] 03/08/2017, 10:22 AM

## 2017-03-09 LAB — GLUCOSE, CAPILLARY
Glucose-Capillary: 102 mg/dL — ABNORMAL HIGH (ref 65–99)
Glucose-Capillary: 164 mg/dL — ABNORMAL HIGH (ref 65–99)
Glucose-Capillary: 122 mg/dL — ABNORMAL HIGH (ref 65–99)
Glucose-Capillary: 157 mg/dL — ABNORMAL HIGH (ref 65–99)

## 2017-03-09 MED ORDER — SODIUM CHLORIDE 0.9% FLUSH
3.0000 mL | Freq: Two times a day (BID) | INTRAVENOUS | Status: DC
  Administered 2017-03-10 – 2017-03-12 (×3): 3 mL via INTRAVENOUS

## 2017-03-09 MED ORDER — SODIUM CHLORIDE 0.9% FLUSH
3.0000 mL | INTRAVENOUS | Status: DC | PRN
  Administered 2017-03-09: 3 mL via INTRAVENOUS

## 2017-03-09 MED ORDER — ATORVASTATIN CALCIUM 20 MG PO TABS
20.0000 mg | ORAL_TABLET | Freq: Every day | ORAL | Status: DC
  Administered 2017-03-09 – 2017-03-13 (×5): 20 mg via ORAL
  Filled 2017-03-09 (×5): qty 1

## 2017-03-09 NOTE — Progress Notes (Signed)
Progress Note  Patient Name: Heather Peterson Date of Encounter: 03/09/2017  Primary Cardiologist: AP  Primary Electrophysiologist: SK   Patient Profile     76 y.o. female admitted in transfer from [facility] because of ventricular tachycardia-recurrent monomorphic and polymorphic in the context of progressive coronary disease with 75% plus left main in the context of known left ventricular dysfunction oxygen-dependent COPD for consideration of revascularization  Subjective   Amiodarone stopped yesterday. No further VT overnight. She is feeling well today without major complaint.  Inpatient Medications    Scheduled Meds: . acetaminophen  500 mg Oral QHS  . aspirin  81 mg Oral Daily  . carvedilol  3.125 mg Oral BID  . furosemide  40 mg Intravenous BID  . heparin  5,000 Units Subcutaneous Q8H  . insulin aspart  0-15 Units Subcutaneous TID WC  . insulin aspart  0-5 Units Subcutaneous QHS  . levothyroxine  50 mcg Oral Daily  . mouth rinse  15 mL Mouth Rinse BID  . ranolazine  500 mg Oral BID  . simvastatin  40 mg Oral QHS   Continuous Infusions: . amiodarone Stopped (03/08/17 2000)  . lidocaine 1 mg/min (03/09/17 0700)   PRN Meds: Influenza vac split quadrivalent PF, ipratropium-albuterol, LORazepam, nitroGLYCERIN, sodium chloride   Vital Signs    Vitals:   03/09/17 0500 03/09/17 0600 03/09/17 0700 03/09/17 0735  BP: (!) 80/49 109/78 109/74   Pulse: 80 82 83   Resp: 17 (!) 27 17   Temp:    97.8 F (36.6 C)  TempSrc:    Oral  SpO2: 95% 96% 96%   Weight:  174 lb 6.1 oz (79.1 kg)    Height:        Intake/Output Summary (Last 24 hours) at 03/09/17 0813 Last data filed at 03/09/17 0700  Gross per 24 hour  Intake            735.4 ml  Output             2230 ml  Net          -1494.6 ml   Filed Weights   03/06/17 1730 03/09/17 0600  Weight: 179 lb 7.3 oz (81.4 kg) 174 lb 6.1 oz (79.1 kg)    Telemetry    A paced V sensed. No ventricular tachycardia - Personally  Reviewed  ECG    Atrial paced - personally reviewed  Physical Exam    GEN: Well nourished, well developed, in no acute distress  HEENT: normal  Neck: no JVD Cardiac: RRR; no murmurs, rubs, or gallops,no edema  Respiratory:  clear to auscultation bilaterally, normal work of breathing GI: soft, nontender, nondistended, + BS MS: no deformity or atrophy  Skin: warm and dry Neuro:  Strength and sensation are intact Psych: euthymic mood, full affect   Labs    Chemistry  Recent Labs Lab 03/04/17 2247 03/05/17 0541 03/06/17 0434 03/06/17 1859 03/07/17 0206  NA 139 139 140  --  136  K 3.5 3.2* 3.3*  --  4.9  CL 101 102 103  --  101  CO2 24 27 27   --  27  GLUCOSE 180* 109* 130*  --  133*  BUN 23* 20 17  --  13  CREATININE 1.32* 1.11* 1.06* 1.00 1.11*  CALCIUM 9.6 8.7* 8.6*  --  8.9  PROT 7.9  --   --   --   --   ALBUMIN 3.9  --   --   --   --  AST 23  --   --   --   --   ALT 12*  --   --   --   --   ALKPHOS 73  --   --   --   --   BILITOT 0.8  --   --   --   --   GFRNONAA 38* 47* 50* 54* 47*  GFRAA 45* 55* 58* >60 55*  ANIONGAP 14 10 10   --  8     Hematology  Recent Labs Lab 03/04/17 2247 03/05/17 0541 03/06/17 1859  WBC 11.7* 7.1 9.0  RBC 3.72* 3.45* 3.43*  HGB 10.9* 9.9* 9.8*  HCT 32.6* 30.1* 30.8*  MCV 87.8 87.3 89.8  MCH 29.2 28.7 28.6  MCHC 33.3 32.9 31.8  RDW 18.3* 18.2* 17.7*  PLT 248 193 204    Cardiac Enzymes  Recent Labs Lab 03/04/17 2247 03/05/17 0541 03/05/17 1039 03/05/17 1715  TROPONINI <0.03 0.30* 0.25* 0.16*   No results for input(s): TROPIPOC in the last 168 hours.   BNP  Recent Labs Lab 03/04/17 2247  BNP 833.0*     DDimer No results for input(s): DDIMER in the last 168 hours.   Radiology    Ct Angio Chest/abd/pel For Dissection W And/or W/wo  Result Date: 03/08/2017 CLINICAL DATA:  Thoracic aorta disease, pre op planning. HTN, DM Repeat scan preformed due to clipped chest anatomy, same bolus. EXAM: CT ANGIOGRAPHY  CHEST, ABDOMEN AND PELVIS TECHNIQUE: Multidetector CT imaging through the chest, abdomen and pelvis was performed using the standard protocol during bolus administration of intravenous contrast. Multiplanar reconstructed images and MIPs were obtained and reviewed to evaluate the vascular anatomy. CONTRAST:  100 mL Isovue 370 IV COMPARISON:  01/18/2009 FINDINGS: CTA CHEST FINDINGS Cardiovascular: The noncontrast scan shows no hyperdense crescent to suggest intramural hematoma. Left subclavian AICD leads extend toward the right atrium and right ventricular apex. Mild four-chamber cardiac enlargement. Limited opacification of the pulmonary arterial tree; the exam was not optimized for detection of pulmonary emboli. Patent bilateral pulmonary veins drain into the left atrium. Coronary calcifications and stents evident. Scattered calcified plaque through the thoracic aorta. No aneurysm, dissection, or stenosis. Bovine variant brachiocephalic arterial origin anatomy without proximal stenosis. Mediastinum/Nodes: Small bilateral pleural effusions. No pericardial effusion. Right paratracheal lymph nodes measured up to 1.4 cm short axis diameter. Bilateral hilar nodes up to 1 cm short axis diameter. Lungs/Pleura: dependent atelectasis/consolidation posteriorly bilaterally. Nodular airspace opacity measuring 1.8 cm in the superior segment right lower lobe in the smaller similar 1.2 cm subpleural nodular opacity in the posterior basal segment right lower lobe, as well as less well-defined airspace opacities in the posterior segments of both upper lobes right greater than left, superior and posterior basal segments left lower lobe. Emphysematous changes evident in the upper lobes. Musculoskeletal: No fracture or worrisome bone lesion. Review of the MIP images confirms the above findings. CTA ABDOMEN AND PELVIS FINDINGS VASCULAR Aorta: Moderate calcified atheromatous plaque, particularly in the infrarenal segment. No aneurysm,  dissection, or stenosis. Celiac: Short-segment ostial stenosis at the level of the median arcuate ligament of the diaphragm, patent distally. SMA: Calcified ostial plaque without high-grade stenosis. Replaced right hepatic arterial supply, an anatomic variant. Renals: Single left and right renal arteries, both with calcified ostial plaque resulting in short segment stenosis of possible hemodynamic significance. IMA: Patent Inflow: Fusiform dilatation of the right common iliac artery up to 1.9 cm diameter, tapering to normal caliber above the bifurcation. Short-segment dissection in the right  external iliac artery, contributing to tandem stenoses in its proximal and distal segments. Eccentric calcified plaque in left iliac arterial system. No high-grade stenosis, dissection, or aneurysm. Visualized proximal outflow unremarkable bilaterally. Veins: No obvious venous abnormality within the limitations of this arterial phase study. Review of the MIP images confirms the above findings. NON-VASCULAR Hepatobiliary: No focal liver abnormality is seen. No biliary dilatation. Pancreas: Unremarkable. No pancreatic ductal dilatation or surrounding inflammatory changes. Spleen: Normal in size without focal abnormality. Adrenals/Urinary Tract: 4.3 cm low-attenuation left adrenal mass stable, presumably benign adenoma. Chronic Focal parenchymal loss in the lower pole right kidney. 3 mm calculus in the lower pole left renal collecting system. No hydronephrosis. Urinary bladder incompletely distended. Stomach/Bowel: Stomach and small bowel nondilated. Appendix not discretely identified. The colon is nondilated with moderate proximal fecal material. A few scattered sigmoid diverticula without adjacent inflammatory/edematous change or abscess. Lymphatic: No abdominal or pelvic adenopathy. Reproductive: Uterus and bilateral adnexa are unremarkable. Other: No ascites.  No free air. Musculoskeletal: Mild spondylitic changes in the lower  lumbar spine. Negative for fracture or worrisome bone lesion. Review of the MIP images confirms the above findings. IMPRESSION: 1. Atheromatous thoracic and abdominal aorta without aneurysm, dissection or stenosis. 2. 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 3. Dissection in the proximal and mid RIGHT external iliac artery with tandem stenoses of potential hemodynamic significance. Correlate with clinical symptomatology and ABIs. 4. Calcified plaque through the LEFT iliac arterial system without high-grade stenosis, dissection or aneurysm 5. Small pleural effusions. 6. Nodular pulmonary opacities in right lower lobe, possibly infectious/inflammatory although follow-up is recommended to exclude neoplasm. 7. Mildly enlarged mediastinal and bilateral hilar lymph nodes, possibly reactive but nonspecific. Electronically Signed   By: Lucrezia Europe M.D.   On: 03/08/2017 12:33    Cardiac Studies      Device Interrogation        Assessment & Plan   Ventricular tachycardia monomorphic  Ventricular tachycardia-polymorphic-torsades  Ischemic cardiomyopathy with 75% left main left ventricular dysfunction  Oxygen-dependent COPD  Congestive heart failure-acute on chronic systolic  Diabetes   Amiodarone stopped yesterday. Patient feeling well having no further ventricular tachycardia. We'll continue to hold amiodarone. We'll continue lidocaine and Ranexa. There is a plan for revascularization next week. It is certainly possible that ischemia has caused her ventricular tachycardia.   Dametrius Sanjuan Meredith Leeds, MD  03/09/2017, 8:13 AM

## 2017-03-10 ENCOUNTER — Encounter (HOSPITAL_COMMUNITY): Payer: Medicare Other

## 2017-03-10 LAB — GLUCOSE, CAPILLARY
Glucose-Capillary: 99 mg/dL (ref 65–99)
Glucose-Capillary: 160 mg/dL — ABNORMAL HIGH (ref 65–99)
Glucose-Capillary: 111 mg/dL — ABNORMAL HIGH (ref 65–99)

## 2017-03-10 LAB — PROTIME-INR
INR: 1.06
Prothrombin Time: 13.7 seconds (ref 11.4–15.2)

## 2017-03-10 LAB — BASIC METABOLIC PANEL
Anion gap: 12 (ref 5–15)
BUN: 26 mg/dL — ABNORMAL HIGH (ref 6–20)
CO2: 31 mmol/L (ref 22–32)
Calcium: 9 mg/dL (ref 8.9–10.3)
Chloride: 91 mmol/L — ABNORMAL LOW (ref 101–111)
Creatinine, Ser: 1.32 mg/dL — ABNORMAL HIGH (ref 0.44–1.00)
GFR calc Af Amer: 45 mL/min — ABNORMAL LOW (ref >60)
GFR calc non Af Amer: 38 mL/min — ABNORMAL LOW (ref >60)
Glucose, Bld: 109 mg/dL — ABNORMAL HIGH (ref 65–99)
Potassium: 3.2 mmol/L — ABNORMAL LOW (ref 3.5–5.1)
Sodium: 134 mmol/L — ABNORMAL LOW (ref 135–145)

## 2017-03-10 LAB — HEPATIC FUNCTION PANEL
ALT: 14 U/L (ref 14–54)
AST: 20 U/L (ref 15–41)
Albumin: 3.2 g/dL — ABNORMAL LOW (ref 3.5–5.0)
Alkaline Phosphatase: 79 U/L (ref 38–126)
Bilirubin, Direct: 0.1 mg/dL (ref 0.1–0.5)
Indirect Bilirubin: 0.5 mg/dL (ref 0.3–0.9)
Total Bilirubin: 0.6 mg/dL (ref 0.3–1.2)
Total Protein: 7.6 g/dL (ref 6.5–8.1)

## 2017-03-10 LAB — LIDOCAINE LEVEL: Lidocaine Lvl: 3.1 ug/mL (ref 1.5–5.0)

## 2017-03-10 MED ORDER — SODIUM CHLORIDE 0.9% FLUSH: 3.0000 mL | INTRAVENOUS | Status: DC | PRN

## 2017-03-10 MED ORDER — MEXILETINE HCL 200 MG PO CAPS
200.0000 mg | ORAL_CAPSULE | Freq: Two times a day (BID) | ORAL | Status: DC
  Administered 2017-03-10 – 2017-03-14 (×9): 200 mg via ORAL
  Filled 2017-03-10 (×9): qty 1

## 2017-03-10 MED ORDER — POTASSIUM CHLORIDE CRYS ER 20 MEQ PO TBCR
40.0000 meq | EXTENDED_RELEASE_TABLET | Freq: Once | ORAL | Status: AC
  Administered 2017-03-10: 40 meq via ORAL
  Filled 2017-03-10: qty 2

## 2017-03-10 MED ORDER — SODIUM CHLORIDE 0.9 % IV SOLN: 250.0000 mL | INTRAVENOUS | Status: DC | PRN

## 2017-03-10 MED ORDER — CLOPIDOGREL BISULFATE 300 MG PO TABS
300.0000 mg | ORAL_TABLET | Freq: Every day | ORAL | Status: DC
  Administered 2017-03-10: 300 mg via ORAL
  Filled 2017-03-10: qty 1

## 2017-03-10 MED ORDER — SODIUM CHLORIDE 0.9 % IV SOLN
INTRAVENOUS | Status: DC
  Administered 2017-03-11: 06:00:00 via INTRAVENOUS

## 2017-03-10 MED ORDER — CLOPIDOGREL BISULFATE 75 MG PO TABS
75.0000 mg | ORAL_TABLET | Freq: Every day | ORAL | Status: DC
  Administered 2017-03-11 – 2017-03-14 (×4): 75 mg via ORAL
  Filled 2017-03-10 (×4): qty 1

## 2017-03-10 MED ORDER — SODIUM CHLORIDE 0.9% FLUSH: 3.0000 mL | Freq: Two times a day (BID) | INTRAVENOUS | Status: DC

## 2017-03-10 NOTE — Progress Notes (Signed)
Progress Note  Patient Name: Heather Peterson Date of Encounter: 03/10/2017  Primary Cardiologist: Dr. Saralyn Pilar  Subjective   No CP, feels anxious about uncertain plans  Inpatient Medications    Scheduled Meds: . acetaminophen  500 mg Oral QHS  . aspirin  81 mg Oral Daily  . atorvastatin  20 mg Oral q1800  . carvedilol  3.125 mg Oral BID  . furosemide  40 mg Intravenous BID  . heparin  5,000 Units Subcutaneous Q8H  . insulin aspart  0-15 Units Subcutaneous TID WC  . insulin aspart  0-5 Units Subcutaneous QHS  . levothyroxine  50 mcg Oral Daily  . mouth rinse  15 mL Mouth Rinse BID  . ranolazine  500 mg Oral BID  . sodium chloride flush  3 mL Intravenous Q12H   Continuous Infusions: . amiodarone Stopped (03/08/17 2000)  . lidocaine 1 mg/min (03/10/17 0700)   PRN Meds: Influenza vac split quadrivalent PF, ipratropium-albuterol, LORazepam, nitroGLYCERIN, sodium chloride, sodium chloride flush   Vital Signs    Vitals:   03/10/17 0400 03/10/17 0500 03/10/17 0600 03/10/17 0700  BP: 108/69 114/80 125/79 121/76  Pulse: 81 80 80 79  Resp: 20 20 (!) 24 20  Temp: 98 F (36.7 C)     TempSrc: Oral     SpO2: 96% 96% 96% 98%  Weight:      Height:        Intake/Output Summary (Last 24 hours) at 03/10/17 0716 Last data filed at 03/10/17 0700  Gross per 24 hour  Intake             1004 ml  Output             2150 ml  Net            -1146 ml   Filed Weights   03/06/17 1730 03/09/17 0600  Weight: 179 lb 7.3 oz (81.4 kg) 174 lb 6.1 oz (79.1 kg)    Telemetry    A paced/V sensed, no VT - Personally Reviewed  ECG    A paced, V sensed - Personally Reviewed  Physical Exam   GEN: No acute distress.   Neck: No JVD Cardiac: RRR, soft SM, no rubs, or gallops.  Respiratory: CTA b/l. GI: Soft, nontender, non-distended  MS: No edema; No deformity. Neuro:  Nonfocal  Psych: Normal affect   Labs    Chemistry Recent Labs Lab 03/04/17 2247 03/05/17 0541  03/06/17 0434 03/06/17 1859 03/07/17 0206  NA 139 139 140  --  136  K 3.5 3.2* 3.3*  --  4.9  CL 101 102 103  --  101  CO2 24 27 27   --  27  GLUCOSE 180* 109* 130*  --  133*  BUN 23* 20 17  --  13  CREATININE 1.32* 1.11* 1.06* 1.00 1.11*  CALCIUM 9.6 8.7* 8.6*  --  8.9  PROT 7.9  --   --   --   --   ALBUMIN 3.9  --   --   --   --   AST 23  --   --   --   --   ALT 12*  --   --   --   --   ALKPHOS 73  --   --   --   --   BILITOT 0.8  --   --   --   --   GFRNONAA 38* 47* 50* 54* 47*  GFRAA 45* 55* 58* >60 55*  ANIONGAP 14 10 10   --  8     Hematology Recent Labs Lab 03/04/17 2247 03/05/17 0541 03/06/17 1859  WBC 11.7* 7.1 9.0  RBC 3.72* 3.45* 3.43*  HGB 10.9* 9.9* 9.8*  HCT 32.6* 30.1* 30.8*  MCV 87.8 87.3 89.8  MCH 29.2 28.7 28.6  MCHC 33.3 32.9 31.8  RDW 18.3* 18.2* 17.7*  PLT 248 193 204    Cardiac Enzymes Recent Labs Lab 03/04/17 2247 03/05/17 0541 03/05/17 1039 03/05/17 1715  TROPONINI <0.03 0.30* 0.25* 0.16*   No results for input(s): TROPIPOC in the last 168 hours.   BNP Recent Labs Lab 03/04/17 2247  BNP 833.0*     DDimer No results for input(s): DDIMER in the last 168 hours.   Radiology    No results found.  Cardiac Studies   Cath 03/05/17 LEFT HEART CATH AND CORONARY ANGIOGRAPHY  Conclusion    Prox LAD to Dist LAD lesion, 0 %stenosed.  Dist LAD lesion, 90 %stenosed.  Prox Cx to Mid Cx lesion, 0 %stenosed.  Ost 2nd Mrg to 2nd Mrg lesion, 50 %stenosed.  Ost LM lesion, 75 %stenosed.  Mid RCA-2 lesion, 30 %stenosed.  Mid RCA-1 lesion, 30 %stenosed.  Prox RCA lesion, 30 %stenosed. 1. Patent stents mid LAD and proximal left circumflex 2. 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 90% stenosis mid to distal LAD 4. Severe, ischemic cardiomyopathy, with LVEF of 30% with anterior apical akinesis Recommendations 1. Three-vessel CABG, versus complex, high risk PCI ostial left main and distal LAD, versus medical  therapy   02/05/17: TTE Study Conclusions - Left ventricle: The cavity size was mildly dilated. Wall   thickness was normal. Systolic function was moderately reduced.   The estimated ejection fraction was in the range of 35% to 40%.   Wall motion was normal; there were no regional wall motion   abnormalities. Doppler parameters are consistent with abnormal   left ventricular relaxation (grade 1 diastolic dysfunction). - Mitral valve: There was moderate regurgitation. - Left atrium: The atrium was mildly dilated. - Right ventricle: The cavity size was mildly dilated. Wall   thickness was normal. Impressions: - Moderately reduced left ventricular function   inferior apical severe hypokinesis   mild left ventricular enlargement   ejection fraction between 35 and 40%   pacer wire in RV   No cardiac source of emboli was indentified.  Patient Profile     76 y.o. female with a history of CAD, chronic systolic CHF, VT/VF s/p ICD, DM, oxygen dependent COPD and ongoing tobacco abuse admitted initially to [facility] 03/04/17 though transferred to [facility] 03/07/17 2/2 recurrent MMVT and PMVT/torsades, suspect 2/2 progressive CAD for evaluation of revascularization options  Assessment & Plan    1. CAD Cath as above. 75% ostial left main and 90% m/dLAD.  On  ASA, statin, BB, ranexa CTS felt poor CABG candidate Await final decision on PCI candidacy  Continue care with primary cardiology team  2. Chronic combined CHF On BB, no ARB currently  Cumulatively negative -4525m  3. VT (both monomorphic and torsades) ICD in place quiet Amiodarone stopped 03/08/17  Remains on lidocaine gtt (day # 5) I don't appreciate any signs of neuro tox 03/07/17 lido level was ok, recheck today Will review EKG with Dr.Jimya Ciani, I don't measure QT as long Labs ordered  4. HTN No changes today  5. HLD On statin  6. COPD (chronically O2 dependent)  7. DM    For questions or updates, please contact [facility] Please consult [email or web address] for contact info under Cardiology/STEMI.      Signed, Baldwin Jamaica, PA-C  03/10/2017, 7:16 AM   Will continue 1B therapy, and change to PO  Mex 200 bid Continue aggressive medical therapy for ischemia Will add imdur and continue ranolazine Await input from [facility] and interventional team regarding high risk intervention  QT looks ok, ECG confused by Atrial paced Pwave with long PR

## 2017-03-10 NOTE — Progress Notes (Signed)
Progress Note  Patient Name: Heather Peterson Date of Encounter: 03/10/2017  Primary Cardiologist: Paraschos  Subjective   No chest pain this morning, breathing is good. Anxious about plans moving forward.   Inpatient Medications    Scheduled Meds: . acetaminophen  500 mg Oral QHS  . aspirin  81 mg Oral Daily  . atorvastatin  20 mg Oral q1800  . carvedilol  3.125 mg Oral BID  . furosemide  40 mg Intravenous BID  . heparin  5,000 Units Subcutaneous Q8H  . insulin aspart  0-15 Units Subcutaneous TID WC  . insulin aspart  0-5 Units Subcutaneous QHS  . levothyroxine  50 mcg Oral Daily  . mouth rinse  15 mL Mouth Rinse BID  . mexiletine  200 mg Oral Q12H  . ranolazine  500 mg Oral BID  . sodium chloride flush  3 mL Intravenous Q12H   Continuous Infusions: . amiodarone Stopped (03/08/17 2000)   PRN Meds: Influenza vac split quadrivalent PF, ipratropium-albuterol, LORazepam, nitroGLYCERIN, sodium chloride, sodium chloride flush   Vital Signs    Vitals:   03/10/17 0944 03/10/17 1000 03/10/17 1100 03/10/17 1146  BP: (!) 98/59 113/68 106/69   Pulse: 80 79 80   Resp:  (!) 23 13   Temp:    98.5 F (36.9 C)  TempSrc:    Oral  SpO2:  97% 97%   Weight:      Height:        Intake/Output Summary (Last 24 hours) at 03/10/17 1156 Last data filed at 03/10/17 0700  Gross per 24 hour  Intake              920 ml  Output             2000 ml  Net            -1080 ml   Filed Weights   03/06/17 1730 03/09/17 0600  Weight: 179 lb 7.3 oz (81.4 kg) 174 lb 6.1 oz (79.1 kg)    Telemetry    SR, A-paced - Personally Reviewed  ECG    A-paced - Personally Reviewed  Physical Exam   General: Older White female appearing in no acute distress. Head: Normocephalic, atraumatic.  Neck: Supple without bruits, JVD. Lungs:  Resp regular and unlabored, CTA. Heart: RRR, S1, S2, no S3, S4, or murmur; no rub. Abdomen: Soft, non-tender, non-distended with normoactive bowel sounds.    Extremities: No clubbing, cyanosis, edema. Distal pedal pulses are 2+ bilaterally. Neuro: Alert and oriented X 3. Moves all extremities spontaneously. Psych: Normal affect.  Labs    Chemistry Recent Labs Lab 03/04/17 2247  03/06/17 0434 03/06/17 1859 03/07/17 0206 03/10/17 0736  NA 139  < > 140  --  136 134*  K 3.5  < > 3.3*  --  4.9 3.2*  CL 101  < > 103  --  101 91*  CO2 24  < > 27  --  27 31  GLUCOSE 180*  < > 130*  --  133* 109*  BUN 23*  < > 17  --  13 26*  CREATININE 1.32*  < > 1.06* 1.00 1.11* 1.32*  CALCIUM 9.6  < > 8.6*  --  8.9 9.0  PROT 7.9  --   --   --   --  7.6  ALBUMIN 3.9  --   --   --   --  3.2*  AST 23  --   --   --   --  20  ALT 12*  --   --   --   --  14  ALKPHOS 73  --   --   --   --  79  BILITOT 0.8  --   --   --   --  0.6  GFRNONAA 38*  < > 50* 54* 47* 38*  GFRAA 45*  < > 58* >60 55* 45*  ANIONGAP 14  < > 10  --  8 12  < > = values in this interval not displayed.   Hematology Recent Labs Lab 03/04/17 2247 03/05/17 0541 03/06/17 1859  WBC 11.7* 7.1 9.0  RBC 3.72* 3.45* 3.43*  HGB 10.9* 9.9* 9.8*  HCT 32.6* 30.1* 30.8*  MCV 87.8 87.3 89.8  MCH 29.2 28.7 28.6  MCHC 33.3 32.9 31.8  RDW 18.3* 18.2* 17.7*  PLT 248 193 204    Cardiac Enzymes Recent Labs Lab 03/04/17 2247 03/05/17 0541 03/05/17 1039 03/05/17 1715  TROPONINI <0.03 0.30* 0.25* 0.16*   No results for input(s): TROPIPOC in the last 168 hours.   BNP Recent Labs Lab 03/04/17 2247  BNP 833.0*     DDimer No results for input(s): DDIMER in the last 168 hours.    Radiology    No results found.  Cardiac Studies   Cath: 03/05/17  Conclusion     Prox LAD to Dist LAD lesion, 0 %stenosed.  Dist LAD lesion, 90 %stenosed.  Prox Cx to Mid Cx lesion, 0 %stenosed.  Ost 2nd Mrg to 2nd Mrg lesion, 50 %stenosed.  Ost LM lesion, 75 %stenosed.  Mid RCA-2 lesion, 30 %stenosed.  Mid RCA-1 lesion, 30 %stenosed.  Prox RCA lesion, 30 %stenosed.   1. Patent stents  mid LAD and proximal left circumflex 2. 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 90% stenosis mid to distal LAD 4. Severe, ischemic cardiomyopathy, with LVEF of 30% with anterior apical akinesis  Recommendations  1. Three-vessel CABG, versus complex, high risk PCI ostial left main and distal LAD, versus medical therapy    Patient Profile     76 y.o. female with PMH of CAD, chronic systolic CHF, VT/VF s/p ICD, DM, oxygen dependent COPD and ongoing tobacco who was transfered from [facility] because of ventricular tachycardia-recurrent monomorphic and polymorphic in the context of progressive coronary disease with 75% plus left main in the context of known left ventricular dysfunction oxygen-dependent COPD sent for consultation for revascularization options.   Assessment & Plan    1. CAD: Cath noted above. Consulted by TCTS and not felt to be a good surgical candidate given multiple co-mobidities. Has 75% LM disease, unsure whether any options for PCI at this point. MD to follow up on recommendations.   2. VT/VF s/p ICM: EP following. Was on amiodarone, now dc'ed. Lidocaine drip stopped and started on Mexiletine.   3. ICM: LV gram noted EF of 30%. Currently diuresing with IV lasix. Net - 4L, and weight trending down.   4. Hypokalemia: 3.2 this morning. Replete   Signed, Reino Bellis, NP  03/10/2017, 11:56 AM  Pager # [phone]   Patient seen, examined. Available data reviewed. Agree with findings, assessment, and plan as outlined by Reino Bellis, NP-C.  The patient is independently interviewed and examined.  On examination, she is an alert, oriented woman in no distress.  Lungs are clear.  Heart is regular rate and rhythm with no murmur or gallop.  JVP is normal.  Abdomen is soft and nontender.  Bilateral groin sites are nontender.  There is no pretibial edema.  I have personally reviewed the patient's cardiac catheterization films.  I have also reviewed extensive  records including her cardiac surgical consultation, previous cardiac catheterization films and note, echo data, telemetry, and electrophysiology evaluation.  The patient's recent cardiac catheterization demonstrates new severe left main disease compared to her previous study.  She is clearly had a large anterior infarct based on ventriculography with akinesis of the anterolateral and apical walls.  She is having recurrent ventricular tachycardia in the setting of a large scar burden as well as a large area of potential ischemia in this patient with severe left main disease and dominant left coronary circulation.  Reviewed treatment options in detail.  With severe COPD on home O2 and poor functional capacity, I completely agree that she is not a reasonable candidate for cardiac surgery.  We reviewed the pros and cons of PCI versus medical therapy.  I also reviewed the CT scan of the chest/abdomen/pelvis to evaluate access for hemodynamic support.  She has at least moderate areas of plaque in both iliac arteries and the distal aorta with borderline size for a 14 French sheath that would be necessary for an Impella.  I think this could be attempted if needed.  I reviewed her case with Dr. Ellyn Hack.  It seems reasonable to proceed with intravascular ultrasound guided PCI of the left main.  If there is not circumferential calcification present, angioplasty and stenting could be considered.  If there is full circumferential calcification, the patient will likely require atherectomy which would mandate hemodynamic support.  All of these issues are detailed with the patient and her family as well as the increased risk of periprocedural infarct, vascular complication, further arrhythmia, and death.  With all of that said, her risk is clearly not prohibitive and medical therapy alone would portend a poor prognosis in this patient with severe left main disease and severe COPD.  After full discussion, they would like to  proceed.  This will all be arranged with Dr. Ellyn Hack tomorrow morning in the Cath Lab.  Sherren Mocha, M.D. 03/10/2017 2:58 PM   For questions or updates, please contact [place] Please consult [email or web address] for contact info under Cardiology/STEMI. Daytime calls, contact the Day Call APP (6a-8a) or assigned team (Teams A-D) provider (7:30a - 5p). All other daytime calls (7:30-5p), contact the Card Master @ [phone].   Nighttime calls, contact the assigned APP (5p-8p) or MD (6:30p-8p). Overnight calls (8p-6a), contact the on call Fellow @ [phone].

## 2017-03-10 NOTE — Plan of Care (Signed)
Problem: Cardiac: Goal: Ability to achieve and maintain adequate cardiopulmonary perfusion will improve Outcome: Progressing Pt without any further VT runs after St Jude pacer adjustment and D/Cing Amiodarone. Pt remains on Lidocaine drip @ 1 mg/hr. Hemodynamics stable.  Problem: Education: Goal: Ability to manage disease process will improve Outcome: Progressing Pt with frequent discussions of treatment options with multiple MDs r/t medical management vs Interventional treatments. Questions answered and support given to Pt and family.

## 2017-03-10 NOTE — Progress Notes (Signed)
Physical Therapy Treatment Patient Details Name: Heather Peterson MRN: [id number] DOB: [birth year] Today's Date: 03/10/2017    History of Present Illness Heather Peterson is a 76 y.o. female with a hx of severe CAD, Ischemic CM with Left Main CAD & Recurrent Ventricular Tachycardia and severe COPD who is being seen for the evaluation of severe left main CAD. Options being discussed are CABG vs PCI vs medical management.    PT Comments    Pt is awaiting PCI procedure tomorrow and had just returned to bed from sitting up for a number of hours. Nurse requested that PT limit session to bed exercises today. Pt requires skilled PT in the acute setting after PCI to progress mobility and improve strength and endurance to safely navigate their discharge environment.    Follow Up Recommendations  No PT follow up     Equipment Recommendations  Rolling walker with 5" wheels    Recommendations for Other Services       Precautions / Restrictions Precautions Precautions: Fall Precaution Comments: has had multiple ICD firings and passes out each time Restrictions Weight Bearing Restrictions: No          Cognition Arousal/Alertness: Awake/alert Behavior During Therapy: WFL for tasks assessed/performed Overall Cognitive Status: History of cognitive impairments - at baseline                                 General Comments: pt recently received Ativan and is slightly groggy but able to participate in bed exercises       Exercises General Exercises - Lower Extremity Ankle Circles/Pumps: AROM;Both;10 reps;Supine Quad Sets: AROM;Both;10 reps;Supine Gluteal Sets: AROM;Both;10 reps;Supine Heel Slides: AROM;Both;10 reps;Supine Hip ABduction/ADduction: AROM;Both;10 reps;Supine Straight Leg Raises: AROM;Both;10 reps;Supine    General Comments General comments (skin integrity, edema, etc.): VSS throughout session       Pertinent Vitals/Pain Pain Assessment: No/denies pain            PT Goals (current goals can now be found in the care plan section) Acute Rehab PT Goals Patient Stated Goal: return home PT Goal Formulation: With patient Time For Goal Achievement: 03/22/17 Potential to Achieve Goals: Good Progress towards PT goals: Not progressing toward goals - comment (pt limited to bed exercises secondary to 75% LM disease)    Frequency    Min 3X/week      PT Plan Current plan remains appropriate       AM-PAC PT "6 Clicks" Daily Activity  Outcome Measure  Difficulty turning over in bed (including adjusting bedclothes, sheets and blankets)?: A Little Difficulty moving from lying on back to sitting on the side of the bed? : A Little Difficulty sitting down on and standing up from a chair with arms (e.g., wheelchair, bedside commode, etc,.)?: A Little Help needed moving to and from a bed to chair (including a wheelchair)?: A Little Help needed walking in hospital room?: A Little Help needed climbing 3-5 steps with a railing? : A Lot 6 Click Score: 17    End of Session Equipment Utilized During Treatment: Oxygen Activity Tolerance: Patient tolerated treatment well Patient left: in chair;with call bell/phone within reach Nurse Communication: Mobility status PT Visit Diagnosis: Unsteadiness on feet (R26.81);Muscle weakness (generalized) (M62.81)     Time: 6761-9509 PT Time Calculation (min) (ACUTE ONLY): 13 min  Charges:  $Therapeutic Exercise: 8-22 mins  G Codes:       Mcclain Shall B. Migdalia Dk PT, DPT Acute Rehabilitation  [phone] Pager [phone]     [place] 03/10/2017, 3:50 PM

## 2017-03-11 ENCOUNTER — Encounter (HOSPITAL_COMMUNITY)
Admission: AD | Disposition: A | Discharge status: Home / Self Care | Payer: Self-pay | Source: Other Acute Inpatient Hospital | Attending: Internal Medicine

## 2017-03-11 ENCOUNTER — Encounter (HOSPITAL_COMMUNITY): Payer: Self-pay | Admitting: Cardiology

## 2017-03-11 HISTORY — PX: CORONARY STENT INTERVENTION W/IMPELLA: CATH118235

## 2017-03-11 HISTORY — PX: CENTRAL LINE INSERTION: CATH118232

## 2017-03-11 HISTORY — PX: INTRAVASCULAR PRESSURE WIRE/FFR STUDY: CATH118243

## 2017-03-11 HISTORY — PX: INTRAVASCULAR ULTRASOUND/IVUS: CATH118244

## 2017-03-11 LAB — CBC
HCT: 31 % — ABNORMAL LOW (ref 36.0–46.0)
Hemoglobin: 9.8 g/dL — ABNORMAL LOW (ref 12.0–15.0)
MCH: 28 pg (ref 26.0–34.0)
MCHC: 31.6 g/dL (ref 30.0–36.0)
MCV: 88.6 fL (ref 78.0–100.0)
Platelets: 252 10*3/uL (ref 150–400)
RBC: 3.5 MIL/uL — ABNORMAL LOW (ref 3.87–5.11)
RDW: 17.2 % — ABNORMAL HIGH (ref 11.5–15.5)
WBC: 7.7 10*3/uL (ref 4.0–10.5)

## 2017-03-11 LAB — BASIC METABOLIC PANEL
Anion gap: 10 (ref 5–15)
BUN: 33 mg/dL — ABNORMAL HIGH (ref 6–20)
CO2: 29 mmol/L (ref 22–32)
Calcium: 9.1 mg/dL (ref 8.9–10.3)
Chloride: 96 mmol/L — ABNORMAL LOW (ref 101–111)
Creatinine, Ser: 1.47 mg/dL — ABNORMAL HIGH (ref 0.44–1.00)
GFR calc Af Amer: 39 mL/min — ABNORMAL LOW (ref >60)
GFR calc non Af Amer: 34 mL/min — ABNORMAL LOW (ref >60)
Glucose, Bld: 119 mg/dL — ABNORMAL HIGH (ref 65–99)
Potassium: 4.2 mmol/L (ref 3.5–5.1)
Sodium: 135 mmol/L (ref 135–145)

## 2017-03-11 LAB — POCT ACTIVATED CLOTTING TIME
Activated Clotting Time: 301 seconds
Activated Clotting Time: 180 seconds

## 2017-03-11 LAB — GLUCOSE, CAPILLARY
Glucose-Capillary: 140 mg/dL — ABNORMAL HIGH (ref 65–99)
Glucose-Capillary: 103 mg/dL — ABNORMAL HIGH (ref 65–99)
Glucose-Capillary: 103 mg/dL — ABNORMAL HIGH (ref 65–99)
Glucose-Capillary: 127 mg/dL — ABNORMAL HIGH (ref 65–99)

## 2017-03-11 SURGERY — INTRAVASCULAR ULTRASOUND/IVUS
Anesthesia: LOCAL

## 2017-03-11 MED ORDER — LIDOCAINE HCL (PF) 1 % IJ SOLN
INTRAMUSCULAR | Status: DC | PRN
  Administered 2017-03-11: 10 mL via INTRADERMAL
  Administered 2017-03-11: 2 mL via INTRADERMAL

## 2017-03-11 MED ORDER — SODIUM CHLORIDE 0.9 % IV SOLN: 250.0000 mL | INTRAVENOUS | Status: DC | PRN

## 2017-03-11 MED ORDER — MIDAZOLAM HCL 2 MG/2ML IJ SOLN
INTRAMUSCULAR | Status: DC | PRN
  Administered 2017-03-11: 1 mg via INTRAVENOUS

## 2017-03-11 MED ORDER — ADENOSINE (DIAGNOSTIC) 140MCG/KG/MIN
INTRAVENOUS | Status: AC | PRN
  Administered 2017-03-11: 140 ug/kg/min via INTRAVENOUS

## 2017-03-11 MED ORDER — HEPARIN (PORCINE) IN NACL 2-0.9 UNIT/ML-% IJ SOLN
INTRAMUSCULAR | Status: AC
  Filled 2017-03-11: qty 500

## 2017-03-11 MED ORDER — FENTANYL CITRATE (PF) 100 MCG/2ML IJ SOLN
INTRAMUSCULAR | Status: DC | PRN
  Administered 2017-03-11: 25 ug via INTRAVENOUS

## 2017-03-11 MED ORDER — SODIUM CHLORIDE 0.9% FLUSH
3.0000 mL | Freq: Two times a day (BID) | INTRAVENOUS | Status: DC
  Administered 2017-03-11 – 2017-03-13 (×3): 3 mL via INTRAVENOUS

## 2017-03-11 MED ORDER — IOPAMIDOL (ISOVUE-370) INJECTION 76%
INTRAVENOUS | Status: DC | PRN
Start: 2017-03-11 — End: 2017-03-11
  Administered 2017-03-11: 75 mL via INTRA_ARTERIAL

## 2017-03-11 MED ORDER — HEPARIN SODIUM (PORCINE) 1000 UNIT/ML IJ SOLN
INTRAMUSCULAR | Status: DC | PRN
  Administered 2017-03-11: 7000 [IU] via INTRAVENOUS

## 2017-03-11 MED ORDER — SODIUM CHLORIDE 0.9 % IV BOLUS (SEPSIS)
250.0000 mL | Freq: Once | INTRAVENOUS | Status: AC
  Administered 2017-03-11: 250 mL via INTRAVENOUS

## 2017-03-11 MED ORDER — HEPARIN (PORCINE) IN NACL 2-0.9 UNIT/ML-% IJ SOLN
INTRAMUSCULAR | Status: AC | PRN
  Administered 2017-03-11: 500 mL
  Administered 2017-03-11: 1000 mL

## 2017-03-11 MED ORDER — HEPARIN SODIUM (PORCINE) 5000 UNIT/ML IJ SOLN
5000.0000 [IU] | Freq: Three times a day (TID) | INTRAMUSCULAR | Status: DC
  Administered 2017-03-11 – 2017-03-13 (×7): 5000 [IU] via SUBCUTANEOUS
  Filled 2017-03-11 (×8): qty 1

## 2017-03-11 MED ORDER — FENTANYL CITRATE (PF) 100 MCG/2ML IJ SOLN
INTRAMUSCULAR | Status: AC
  Filled 2017-03-11: qty 2

## 2017-03-11 MED ORDER — HEPARIN (PORCINE) IN NACL 2-0.9 UNIT/ML-% IJ SOLN
INTRAMUSCULAR | Status: AC
  Filled 2017-03-11: qty 1000

## 2017-03-11 MED ORDER — IOPAMIDOL (ISOVUE-370) INJECTION 76%
INTRAVENOUS | Status: AC
  Filled 2017-03-11: qty 125

## 2017-03-11 MED ORDER — SODIUM CHLORIDE 0.9 % IV SOLN
INTRAVENOUS | Status: AC
  Administered 2017-03-11: 11:00:00 via INTRAVENOUS

## 2017-03-11 MED ORDER — VERAPAMIL HCL 2.5 MG/ML IV SOLN
INTRAVENOUS | Status: DC | PRN
  Administered 2017-03-11: 10 mL via INTRA_ARTERIAL

## 2017-03-11 MED ORDER — ADENOSINE 12 MG/4ML IV SOLN
INTRAVENOUS | Status: AC
  Filled 2017-03-11: qty 16

## 2017-03-11 MED ORDER — LABETALOL HCL 5 MG/ML IV SOLN: 10.0000 mg | INTRAVENOUS | Status: AC | PRN

## 2017-03-11 MED ORDER — VERAPAMIL HCL 2.5 MG/ML IV SOLN
INTRAVENOUS | Status: AC
  Filled 2017-03-11: qty 2

## 2017-03-11 MED ORDER — HYDRALAZINE HCL 20 MG/ML IJ SOLN: 5.0000 mg | INTRAMUSCULAR | Status: AC | PRN

## 2017-03-11 MED ORDER — SODIUM CHLORIDE 0.9% FLUSH: 3.0000 mL | INTRAVENOUS | Status: DC | PRN

## 2017-03-11 MED ORDER — MIDAZOLAM HCL 2 MG/2ML IJ SOLN
INTRAMUSCULAR | Status: AC
  Filled 2017-03-11: qty 2

## 2017-03-11 MED ORDER — LIDOCAINE HCL 2 % IJ SOLN
INTRAMUSCULAR | Status: AC
  Filled 2017-03-11: qty 20

## 2017-03-11 MED ORDER — LIDOCAINE HCL 2 % IJ SOLN
INTRAMUSCULAR | Status: AC
Start: 2017-03-11 — End: 2017-03-11
  Filled 2017-03-11: qty 20

## 2017-03-11 MED ORDER — HEPARIN SODIUM (PORCINE) 1000 UNIT/ML IJ SOLN
INTRAMUSCULAR | Status: AC
  Filled 2017-03-11: qty 1

## 2017-03-11 SURGICAL SUPPLY — 23 items
CATH INFINITI JR4 5F (CATHETERS) ×3 IMPLANT
CATH MICROCATH NAVVUS (MICROCATHETER) ×2 IMPLANT
CATH OPTICROSS 40MHZ (CATHETERS) ×3 IMPLANT
CATH VISTA GUIDE 6FR JL3.5 (CATHETERS) ×6 IMPLANT
COVER PRB 48X5XTLSCP FOLD TPE (BAG) ×2 IMPLANT
COVER PROBE 5X48 (BAG) ×1
DEVICE RAD COMP TR BAND LRG (VASCULAR PRODUCTS) ×3 IMPLANT
GLIDESHEATH SLEND A-KIT 6F 22G (SHEATH) ×3 IMPLANT
GUIDEWIRE INQWIRE 1.5J.035X260 (WIRE) ×2 IMPLANT
HOVERMATT SINGLE USE (MISCELLANEOUS) ×3 IMPLANT
INQWIRE 1.5J .035X260CM (WIRE) ×3
KIT ENCORE 26 ADVANTAGE (KITS) ×3 IMPLANT
KIT HEART LEFT (KITS) ×3 IMPLANT
MICROCATHETER NAVVUS (MICROCATHETER) ×3
PACK CARDIAC CATHETERIZATION (CUSTOM PROCEDURE TRAY) ×3 IMPLANT
PAD ELECT DEFIB RADIOL ZOLL (MISCELLANEOUS) ×3 IMPLANT
SHEATH PINNACLE 6F 10CM (SHEATH) ×3 IMPLANT
SHEATH PINNACLE 7F 10CM (SHEATH) ×3 IMPLANT
SLED PULL BACK IVUS (MISCELLANEOUS) ×3 IMPLANT
TRANSDUCER W/STOPCOCK (MISCELLANEOUS) ×3 IMPLANT
TUBING CIL FLEX 10 FLL-RA (TUBING) ×3 IMPLANT
WIRE EMERALD 3MM-J .035X150CM (WIRE) ×3 IMPLANT
WIRE MARVEL STR TIP 190CM (WIRE) ×3 IMPLANT

## 2017-03-11 NOTE — Interval H&P Note (Signed)
History and Physical Interval Note:  03/11/2017 7:47 AM  Heather Peterson  has presented today for surgery, with the diagnosis of left main stenosis, VT  The various methods of treatment have been discussed with the patient and family. After consideration of risks, benefits and other options for treatment, the patient has consented to  Procedure(s): Intravascular Ultrasound/IVUS (N/A) Coronary Stent Intervention w/Impella (N/A) as a surgical intervention .  The patient's history has been reviewed, patient examined, no change in status, stable for surgery.  I have reviewed the patient's chart and labs.  Questions were answered to the patient's satisfaction.    Cath Lab Visit (complete for each Cath Lab visit)  Clinical Evaluation Leading to the Procedure:   ACS: No.  Non-ACS:    Anginal Classification: CCS IV - VTach  Anti-ischemic medical therapy: Maximal Therapy (2 or more classes of medications)  Non-Invasive Test Results: No non-invasive testing performed  Prior CABG: No previous CABG   Glenetta Hew

## 2017-03-11 NOTE — H&P (View-Only) (Signed)
Progress Note  Patient Name: Heather Peterson Date of Encounter: 03/10/2017  Primary Cardiologist: Paraschos  Subjective   No chest pain this morning, breathing is good. Anxious about plans moving forward.   Inpatient Medications    Scheduled Meds: . acetaminophen  500 mg Oral QHS  . aspirin  81 mg Oral Daily  . atorvastatin  20 mg Oral q1800  . carvedilol  3.125 mg Oral BID  . furosemide  40 mg Intravenous BID  . heparin  5,000 Units Subcutaneous Q8H  . insulin aspart  0-15 Units Subcutaneous TID WC  . insulin aspart  0-5 Units Subcutaneous QHS  . levothyroxine  50 mcg Oral Daily  . mouth rinse  15 mL Mouth Rinse BID  . mexiletine  200 mg Oral Q12H  . ranolazine  500 mg Oral BID  . sodium chloride flush  3 mL Intravenous Q12H   Continuous Infusions: . amiodarone Stopped (03/08/17 2000)   PRN Meds: Influenza vac split quadrivalent PF, ipratropium-albuterol, LORazepam, nitroGLYCERIN, sodium chloride, sodium chloride flush   Vital Signs    Vitals:   03/10/17 0944 03/10/17 1000 03/10/17 1100 03/10/17 1146  BP: (!) 98/59 113/68 106/69   Pulse: 80 79 80   Resp:  (!) 23 13   Temp:    98.5 F (36.9 C)  TempSrc:    Oral  SpO2:  97% 97%   Weight:      Height:        Intake/Output Summary (Last 24 hours) at 03/10/17 1156 Last data filed at 03/10/17 0700  Gross per 24 hour  Intake              920 ml  Output             2000 ml  Net            -1080 ml   Filed Weights   03/06/17 1730 03/09/17 0600  Weight: 179 lb 7.3 oz (81.4 kg) 174 lb 6.1 oz (79.1 kg)    Telemetry    SR, A-paced - Personally Reviewed  ECG    A-paced - Personally Reviewed  Physical Exam   General: Older White female appearing in no acute distress. Head: Normocephalic, atraumatic.  Neck: Supple without bruits, JVD. Lungs:  Resp regular and unlabored, CTA. Heart: RRR, S1, S2, no S3, S4, or murmur; no rub. Abdomen: Soft, non-tender, non-distended with normoactive bowel sounds.    Extremities: No clubbing, cyanosis, edema. Distal pedal pulses are 2+ bilaterally. Neuro: Alert and oriented X 3. Moves all extremities spontaneously. Psych: Normal affect.  Labs    Chemistry Recent Labs Lab 03/04/17 2247  03/06/17 0434 03/06/17 1859 03/07/17 0206 03/10/17 0736  NA 139  < > 140  --  136 134*  K 3.5  < > 3.3*  --  4.9 3.2*  CL 101  < > 103  --  101 91*  CO2 24  < > 27  --  27 31  GLUCOSE 180*  < > 130*  --  133* 109*  BUN 23*  < > 17  --  13 26*  CREATININE 1.32*  < > 1.06* 1.00 1.11* 1.32*  CALCIUM 9.6  < > 8.6*  --  8.9 9.0  PROT 7.9  --   --   --   --  7.6  ALBUMIN 3.9  --   --   --   --  3.2*  AST 23  --   --   --   --  20  ALT 12*  --   --   --   --  14  ALKPHOS 73  --   --   --   --  79  BILITOT 0.8  --   --   --   --  0.6  GFRNONAA 38*  < > 50* 54* 47* 38*  GFRAA 45*  < > 58* >60 55* 45*  ANIONGAP 14  < > 10  --  8 12  < > = values in this interval not displayed.   Hematology Recent Labs Lab 03/04/17 2247 03/05/17 0541 03/06/17 1859  WBC 11.7* 7.1 9.0  RBC 3.72* 3.45* 3.43*  HGB 10.9* 9.9* 9.8*  HCT 32.6* 30.1* 30.8*  MCV 87.8 87.3 89.8  MCH 29.2 28.7 28.6  MCHC 33.3 32.9 31.8  RDW 18.3* 18.2* 17.7*  PLT 248 193 204    Cardiac Enzymes Recent Labs Lab 03/04/17 2247 03/05/17 0541 03/05/17 1039 03/05/17 1715  TROPONINI <0.03 0.30* 0.25* 0.16*   No results for input(s): TROPIPOC in the last 168 hours.   BNP Recent Labs Lab 03/04/17 2247  BNP 833.0*     DDimer No results for input(s): DDIMER in the last 168 hours.    Radiology    No results found.  Cardiac Studies   Cath: 03/05/17  Conclusion     Prox LAD to Dist LAD lesion, 0 %stenosed.  Dist LAD lesion, 90 %stenosed.  Prox Cx to Mid Cx lesion, 0 %stenosed.  Ost 2nd Mrg to 2nd Mrg lesion, 50 %stenosed.  Ost LM lesion, 75 %stenosed.  Mid RCA-2 lesion, 30 %stenosed.  Mid RCA-1 lesion, 30 %stenosed.  Prox RCA lesion, 30 %stenosed.   1. Patent stents  mid LAD and proximal left circumflex 2. 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 90% stenosis mid to distal LAD 4. Severe, ischemic cardiomyopathy, with LVEF of 30% with anterior apical akinesis  Recommendations  1. Three-vessel CABG, versus complex, high risk PCI ostial left main and distal LAD, versus medical therapy    Patient Profile     76 y.o. female with PMH of CAD, chronic systolic CHF, VT/VF s/p ICD, DM, oxygen dependent COPD and ongoing tobacco who was transfered from [facility] because of ventricular tachycardia-recurrent monomorphic and polymorphic in the context of progressive coronary disease with 75% plus left main in the context of known left ventricular dysfunction oxygen-dependent COPD sent for consultation for revascularization options.   Assessment & Plan    1. CAD: Cath noted above. Consulted by TCTS and not felt to be a good surgical candidate given multiple co-mobidities. Has 75% LM disease, unsure whether any options for PCI at this point. MD to follow up on recommendations.   2. VT/VF s/p ICM: EP following. Was on amiodarone, now dc'ed. Lidocaine drip stopped and started on Mexiletine.   3. ICM: LV gram noted EF of 30%. Currently diuresing with IV lasix. Net - 4L, and weight trending down.   4. Hypokalemia: 3.2 this morning. Replete   Signed, Reino Bellis, NP  03/10/2017, 11:56 AM  Pager # [phone]   Patient seen, examined. Available data reviewed. Agree with findings, assessment, and plan as outlined by Reino Bellis, NP-C.  The patient is independently interviewed and examined.  On examination, she is an alert, oriented woman in no distress.  Lungs are clear.  Heart is regular rate and rhythm with no murmur or gallop.  JVP is normal.  Abdomen is soft and nontender.  Bilateral groin sites are nontender.  There is no pretibial edema.  I have personally reviewed the patient's cardiac catheterization films.  I have also reviewed extensive  records including her cardiac surgical consultation, previous cardiac catheterization films and note, echo data, telemetry, and electrophysiology evaluation.  The patient's recent cardiac catheterization demonstrates new severe left main disease compared to her previous study.  She is clearly had a large anterior infarct based on ventriculography with akinesis of the anterolateral and apical walls.  She is having recurrent ventricular tachycardia in the setting of a large scar burden as well as a large area of potential ischemia in this patient with severe left main disease and dominant left coronary circulation.  Reviewed treatment options in detail.  With severe COPD on home O2 and poor functional capacity, I completely agree that she is not a reasonable candidate for cardiac surgery.  We reviewed the pros and cons of PCI versus medical therapy.  I also reviewed the CT scan of the chest/abdomen/pelvis to evaluate access for hemodynamic support.  She has at least moderate areas of plaque in both iliac arteries and the distal aorta with borderline size for a 14 French sheath that would be necessary for an Impella.  I think this could be attempted if needed.  I reviewed her case with Dr. Ellyn Hack.  It seems reasonable to proceed with intravascular ultrasound guided PCI of the left main.  If there is not circumferential calcification present, angioplasty and stenting could be considered.  If there is full circumferential calcification, the patient will likely require atherectomy which would mandate hemodynamic support.  All of these issues are detailed with the patient and her family as well as the increased risk of periprocedural infarct, vascular complication, further arrhythmia, and death.  With all of that said, her risk is clearly not prohibitive and medical therapy alone would portend a poor prognosis in this patient with severe left main disease and severe COPD.  After full discussion, they would like to  proceed.  This will all be arranged with Dr. Ellyn Hack tomorrow morning in the Cath Lab.  Sherren Mocha, M.D. 03/10/2017 2:58 PM   For questions or updates, please contact [place] Please consult [email or web address] for contact info under Cardiology/STEMI. Daytime calls, contact the Day Call APP (6a-8a) or assigned team (Teams A-D) provider (7:30a - 5p). All other daytime calls (7:30-5p), contact the Card Master @ [phone].   Nighttime calls, contact the assigned APP (5p-8p) or MD (6:30p-8p). Overnight calls (8p-6a), contact the on call Fellow @ [phone].

## 2017-03-11 NOTE — Care Management Note (Addendum)
Case Management Note  Patient Details  Name: Heather Peterson MRN: [id number] Date of Birth: [birth year]  Subjective/Objective:       Transfer from [facility]  history of CAD, chronic systolic CHF, VT/VF s/p ICD, DM, oxygen dependent COPD and ongoing tobacco abuse  S/p cath, pending eval for TCTS. Lives with spouse at home, has son and his wife and daughter also for support system.  She has home oxygen with [employer] (2 liters), she has a PCP, Dr. Dina Rich and medication coverage.   10/30 [place], BSN - s/p cath,  left main disease is not significant.  She will be managed medically.  PCI/stenting is not indicated at this time. Per pt eval no pt f/u needed but will need a rolling walker.  Will need order for [employer]  And she may need to purchase shower chair from [place].                             Action/Plan: NCM will follow for dc needs.   Expected Discharge Date:                  Expected Discharge Plan:     In-House Referral:     Discharge planning Services  CM Consult  Post Acute Care Choice:    Choice offered to:     DME Arranged:    DME Agency:     HH Arranged:    HH Agency:     Status of Service:  In process, will continue to follow  If discussed at Long Length of Stay Meetings, dates discussed:    Additional Comments:  Zenon Mayo, RN 03/11/2017, 4:25 PM

## 2017-03-11 NOTE — Progress Notes (Signed)
Progress Note  Patient Name: Heather Peterson Date of Encounter: 03/11/2017  Primary Cardiologist: Paraschos  Subjective   The patient feels well.  She had a diagnostic heart catheterization and intravascular ultrasound of the left main this morning.  She denies shortness of breath at rest.  She denies chest pain.  Inpatient Medications    Scheduled Meds: . acetaminophen  500 mg Oral QHS  . aspirin  81 mg Oral Daily  . atorvastatin  20 mg Oral q1800  . carvedilol  3.125 mg Oral BID  . clopidogrel  75 mg Oral Daily  . furosemide  40 mg Intravenous BID  . heparin  5,000 Units Subcutaneous Q8H  . insulin aspart  0-15 Units Subcutaneous TID WC  . insulin aspart  0-5 Units Subcutaneous QHS  . levothyroxine  50 mcg Oral Daily  . mouth rinse  15 mL Mouth Rinse BID  . mexiletine  200 mg Oral Q12H  . ranolazine  500 mg Oral BID  . sodium chloride flush  3 mL Intravenous Q12H  . sodium chloride flush  3 mL Intravenous Q12H   Continuous Infusions: . sodium chloride     PRN Meds: sodium chloride, hydrALAZINE, Influenza vac split quadrivalent PF, ipratropium-albuterol, labetalol, LORazepam, nitroGLYCERIN, sodium chloride, sodium chloride flush, sodium chloride flush   Vital Signs    Vitals:   03/11/17 1200 03/11/17 1230 03/11/17 1300 03/11/17 1400  BP: 105/68 105/67 112/69 112/69  Pulse: 80 81 80   Resp:  17 13 17   Temp: (!) 97.5 F (36.4 C)     TempSrc: Oral     SpO2: 94% 94% 94% 99%  Weight:      Height:        Intake/Output Summary (Last 24 hours) at 03/11/17 1513 Last data filed at 03/11/17 1400  Gross per 24 hour  Intake             1156 ml  Output              900 ml  Net              256 ml   Filed Weights   03/06/17 1730 03/09/17 0600  Weight: 179 lb 7.3 oz (81.4 kg) 174 lb 6.1 oz (79.1 kg)    Telemetry    Sinus rhythm- Personally Reviewed   Physical Exam  Pleasant elderly woman GEN: No acute distress.   Neck: No JVD Cardiac: RRR, no murmurs,  rubs, or gallops.  Respiratory: Clear to auscultation bilaterally. GI: Soft, nontender, non-distended  MS: No edema; No deformity.  Right radial site is clear Neuro:  Nonfocal  Psych: Normal affect   Labs    Chemistry Recent Labs Lab 03/04/17 2247  03/07/17 0206 03/10/17 0736 03/11/17 0416  NA 139  < > 136 134* 135  K 3.5  < > 4.9 3.2* 4.2  CL 101  < > 101 91* 96*  CO2 24  < > 27 31 29   GLUCOSE 180*  < > 133* 109* 119*  BUN 23*  < > 13 26* 33*  CREATININE 1.32*  < > 1.11* 1.32* 1.47*  CALCIUM 9.6  < > 8.9 9.0 9.1  PROT 7.9  --   --  7.6  --   ALBUMIN 3.9  --   --  3.2*  --   AST 23  --   --  20  --   ALT 12*  --   --  14  --   ALKPHOS 73  --   --  79  --   BILITOT 0.8  --   --  0.6  --   GFRNONAA 38*  < > 47* 38* 34*  GFRAA 45*  < > 55* 45* 39*  ANIONGAP 14  < > 8 12 10   < > = values in this interval not displayed.   Hematology Recent Labs Lab 03/05/17 0541 03/06/17 1859 03/11/17 0416  WBC 7.1 9.0 7.7  RBC 3.45* 3.43* 3.50*  HGB 9.9* 9.8* 9.8*  HCT 30.1* 30.8* 31.0*  MCV 87.3 89.8 88.6  MCH 28.7 28.6 28.0  MCHC 32.9 31.8 31.6  RDW 18.2* 17.7* 17.2*  PLT 193 204 252    Cardiac Enzymes Recent Labs Lab 03/04/17 2247 03/05/17 0541 03/05/17 1039 03/05/17 1715  TROPONINI <0.03 0.30* 0.25* 0.16*   No results for input(s): TROPIPOC in the last 168 hours.   BNP Recent Labs Lab 03/04/17 2247  BNP 833.0*     DDimer No results for input(s): DDIMER in the last 168 hours.   Radiology    No results found.  Cardiac Studies   Cardiac catheterization 03/11/2017: Conclusion     Ost LM lesion, 50 %stenosed. - Eccentric Heavily Calcified lesion - MLA 7.5 mm2, FFR 0.93 - Not physiologically significant.  Prox LAD to Dist LAD Stented Segment, 0 %stenosed.  Dist LAD lesion, 90 %stenosed. Beyond Stent - Med Rx.  Prox Cx to Mid Cx Stent, 0 %stenosed.  Ost 2nd Mrg to 2nd Mrg lesion, 50 %stenosed. Beyond Stent   Angiographically, there does appear to  be heavily calcified eccentric lesion in the near ostial left main.  By IVUS this lesion appeared to be moderate with significant minimal luminal area based on the baseline diameter of the Left Main.  The lesion was then reassessed using FFR and was found to be not physiologically significant.  Therefore, we decided to forego attempted PCI. Femoral arterial access was not obtained.   Plan: Continue aggressive CHF management along with antiarrhythmic management. If incessant angina or ventricular tachycardia continues, could consider distal LAD PCI, however this lesion is near apical and in a relatively small diameter area.     Echocardiogram 02/05/2017: Study Conclusions  - Left ventricle: The cavity size was mildly dilated. Wall   thickness was normal. Systolic function was moderately reduced.   The estimated ejection fraction was in the range of 35% to 40%.   Wall motion was normal; there were no regional wall motion   abnormalities. Doppler parameters are consistent with abnormal   left ventricular relaxation (grade 1 diastolic dysfunction). - Mitral valve: There was moderate regurgitation. - Left atrium: The atrium was mildly dilated. - Right ventricle: The cavity size was mildly dilated. Wall   thickness was normal.  Impressions:  - Moderately reduced left ventricular function   inferior apical severe hypokinesis   mild left ventricular enlargement   ejection fraction between 35 and 40%   pacer wire in RV   No cardiac source of emboli was indentified.  Patient Profile     76 y.o. female with ischemic heart disease, left main stenosis, prior anterior MI, and ICD presenting with VT/VT recurrent ICD shocks  Assessment & Plan    1.  Ventricular tachycardia, monomorphic and polymorphic: Management per the EP team.  2.  Coronary artery disease, left main disease: I personally reviewed the patient's intravascular ultrasound images this morning and cardiac catheterization  data.  Both anatomic and physiologic data suggest that her left main disease is not hemodynamically significant.  She will  be managed medically.  PCI/stenting is not indicated at this time.  FFR was 0.93.  Left main minimal lumen area is greater than 7.5 mm.  3.  Chronic systolic heart failure: Does not appear to be volume overloaded on exam.  Disposition: We will discuss with Dr. Caryl Comes.  As long as her rhythm remained stable on mexiletine, I suspect she can be discharged in the next 24-48 hours.  For questions or updates, please contact [place] Please consult [email or web address] for contact info under Cardiology/STEMI.      Deatra James, MD  03/11/2017, 3:13 PM

## 2017-03-12 LAB — BASIC METABOLIC PANEL
Anion gap: 12 (ref 5–15)
Anion gap: 12 (ref 5–15)
BUN: 27 mg/dL — ABNORMAL HIGH (ref 6–20)
BUN: 27 mg/dL — ABNORMAL HIGH (ref 6–20)
CO2: 26 mmol/L (ref 22–32)
CO2: 28 mmol/L (ref 22–32)
Calcium: 9.1 mg/dL (ref 8.9–10.3)
Calcium: 9.1 mg/dL (ref 8.9–10.3)
Chloride: 97 mmol/L — ABNORMAL LOW (ref 101–111)
Chloride: 94 mmol/L — ABNORMAL LOW (ref 101–111)
Creatinine, Ser: 1.22 mg/dL — ABNORMAL HIGH (ref 0.44–1.00)
Creatinine, Ser: 1.31 mg/dL — ABNORMAL HIGH (ref 0.44–1.00)
GFR calc Af Amer: 49 mL/min — ABNORMAL LOW (ref >60)
GFR calc Af Amer: 45 mL/min — ABNORMAL LOW (ref >60)
GFR calc non Af Amer: 42 mL/min — ABNORMAL LOW (ref >60)
GFR calc non Af Amer: 39 mL/min — ABNORMAL LOW (ref >60)
Glucose, Bld: 105 mg/dL — ABNORMAL HIGH (ref 65–99)
Glucose, Bld: 130 mg/dL — ABNORMAL HIGH (ref 65–99)
Potassium: 3.9 mmol/L (ref 3.5–5.1)
Potassium: 3.6 mmol/L (ref 3.5–5.1)
Sodium: 135 mmol/L (ref 135–145)
Sodium: 134 mmol/L — ABNORMAL LOW (ref 135–145)

## 2017-03-12 LAB — CBC
HCT: 30.3 % — ABNORMAL LOW (ref 36.0–46.0)
Hemoglobin: 9.6 g/dL — ABNORMAL LOW (ref 12.0–15.0)
MCH: 28.1 pg (ref 26.0–34.0)
MCHC: 31.7 g/dL (ref 30.0–36.0)
MCV: 88.6 fL (ref 78.0–100.0)
Platelets: 262 10*3/uL (ref 150–400)
RBC: 3.42 MIL/uL — ABNORMAL LOW (ref 3.87–5.11)
RDW: 17.1 % — ABNORMAL HIGH (ref 11.5–15.5)
WBC: 8.2 10*3/uL (ref 4.0–10.5)

## 2017-03-12 LAB — GLUCOSE, CAPILLARY
Glucose-Capillary: 100 mg/dL — ABNORMAL HIGH (ref 65–99)
Glucose-Capillary: 128 mg/dL — ABNORMAL HIGH (ref 65–99)
Glucose-Capillary: 141 mg/dL — ABNORMAL HIGH (ref 65–99)

## 2017-03-12 LAB — MAGNESIUM: Magnesium: 2.1 mg/dL (ref 1.7–2.4)

## 2017-03-12 MED FILL — Lidocaine HCl Local Inj 2%: INTRAMUSCULAR | Qty: 40 | Status: AC

## 2017-03-12 NOTE — Progress Notes (Signed)
Pt just walked with PT. She is sleepy now. We discussed smoking cessation, diet, walking as tolerated and NTG. Voiced understanding. She is planning on quitting smoking. I gave her a fake cigarette and we talked about strategies. Her husband and daughter smoke.  [id number] [place], ACSM 11:12 AM 03/12/2017

## 2017-03-12 NOTE — Discharge Instructions (Signed)
NO DRIVING FOR 6 MONTHS

## 2017-03-12 NOTE — Progress Notes (Signed)
Physical Therapy Treatment Patient Details Name: Heather Peterson MRN: [id number] DOB: 1940-12-01 Today's Date: 03/12/2017    History of Present Illness Heather Peterson is a 76 y.o. female with a hx of severe CAD, Ischemic CM with Left Main CAD & Recurrent Ventricular Tachycardia and severe COPD who is being seen for the evaluation of severe left main CAD. Options being discussed are CABG vs PCI vs medical management.    PT Comments    Pt reported slight symptoms of lightheaded/dizziness. BP 93/62 supine, BP sitting 112/58. Pt very unsteady when ambulating without assistive device. BP sitting at rest 81/48. Used RW to walk back to room and pt did significantly better. RW suggested prior to discharging home today.   Follow Up Recommendations  No PT follow up     Equipment Recommendations  Rolling walker with 5" wheels    Recommendations for Other Services       Precautions / Restrictions Precautions Precautions: Fall Restrictions Weight Bearing Restrictions: No    Mobility  Bed Mobility Overal bed mobility: Modified Independent             General bed mobility comments: needed increased time to get to EOB but was able to accomplish without assist  Transfers Overall transfer level: Needs assistance Equipment used: None Transfers: Sit to/from Stand Sit to Stand: Min guard for safety VC needed for proper hand placement and to push up through arms         General transfer comment: min guard for safety  Ambulation/Gait Ambulation/Gait assistance: Min assist;+2 safety/equipment Ambulation Distance (Feet): 150 Feet without RW 52ft with Rolling walker Assistive device: None;Rolling walker (2 wheeled) (min assist- with no assistive device, min-guard with RW) Gait Pattern/deviations: Staggering left;Staggering right Gait velocity: decreased   General Gait Details: pt with LOB x3 with no assistive device, reported lightheadeness. Pt had better control and stability  when ambulating with RW   Stairs            Wheelchair Mobility    Modified Rankin (Stroke Patients Only)       Balance Overall balance assessment: Needs assistance Sitting-balance support: No upper extremity supported Sitting balance-Leahy Scale: Good Sitting balance - Comments: Cues for erect posture on EOB   Standing balance support: No upper extremity supported Standing balance-Leahy Scale: Poor Standing balance comment: pt can maintain static stance but LOB during ambulation.                            Cognition Arousal/Alertness: Awake/alert Behavior During Therapy: WFL for tasks assessed/performed Overall Cognitive Status: Within Functional Limits for tasks assessed                                        Exercises      General Comments        Pertinent Vitals/Pain Pain Assessment: No/denies pain    Home Living                      Prior Function            PT Goals (current goals can now be found in the care plan section) Acute Rehab PT Goals Patient Stated Goal: return home PT Goal Formulation: With patient Potential to Achieve Goals: Good Progress towards PT goals: Progressing toward goals    Frequency    Min  3X/week      PT Plan Current plan remains appropriate    Co-evaluation              AM-PAC PT "6 Clicks" Daily Activity  Outcome Measure  Difficulty turning over in bed (including adjusting bedclothes, sheets and blankets)?: A Little Difficulty moving from lying on back to sitting on the side of the bed? : A Little Difficulty sitting down on and standing up from a chair with arms (e.g., wheelchair, bedside commode, etc,.)?: A Little Help needed moving to and from a bed to chair (including a wheelchair)?: A Little Help needed walking in hospital room?: A Little Help needed climbing 3-5 steps with a railing? : A Lot 6 Click Score: 17    End of Session Equipment Utilized During  Treatment: Oxygen;Gait belt Activity Tolerance: Patient tolerated treatment well Patient left: with call bell/phone within reach;in bed Nurse Communication: Mobility status (informed nurse to contact Case manager for RW) PT Visit Diagnosis: Unsteadiness on feet (R26.81);Muscle weakness (generalized) (M62.81)     Time: 8250-0370 PT Time Calculation (min) (ACUTE ONLY): 28 min  Charges:  $Gait Training: 8-22 mins $Therapeutic Activity: 8-22 mins                    G Codes:  Functional Assessment Tool Used: AM-PAC 6 Clicks Basic Mobility    Fransisca Connors, SPTA    Fransisca Connors 03/12/2017, 11:26 AM

## 2017-03-12 NOTE — Progress Notes (Addendum)
Attempted to get report for patient. No answer left message with front 2h front desk.

## 2017-03-12 NOTE — Progress Notes (Signed)
Progress Note  Patient Name: Heather Peterson Date of Encounter: 03/12/2017  Primary Cardiologist: Dr. Saralyn Pilar  Subjective   No CP, palpitations or SOB.  Got lightheaded when ambulating this AM  Inpatient Medications    Scheduled Meds: . acetaminophen  500 mg Oral QHS  . aspirin  81 mg Oral Daily  . atorvastatin  20 mg Oral q1800  . carvedilol  3.125 mg Oral BID  . clopidogrel  75 mg Oral Daily  . furosemide  40 mg Intravenous BID  . heparin  5,000 Units Subcutaneous Q8H  . insulin aspart  0-15 Units Subcutaneous TID WC  . insulin aspart  0-5 Units Subcutaneous QHS  . levothyroxine  50 mcg Oral Daily  . mouth rinse  15 mL Mouth Rinse BID  . mexiletine  200 mg Oral Q12H  . ranolazine  500 mg Oral BID  . sodium chloride flush  3 mL Intravenous Q12H  . sodium chloride flush  3 mL Intravenous Q12H   Continuous Infusions: . sodium chloride     PRN Meds: sodium chloride, Influenza vac split quadrivalent PF, ipratropium-albuterol, LORazepam, nitroGLYCERIN, sodium chloride, sodium chloride flush, sodium chloride flush   Vital Signs    Vitals:   03/12/17 0700 03/12/17 0800 03/12/17 0900 03/12/17 1025  BP: 115/68 116/64 115/71   Pulse: 80 80 83   Resp: 14 18 15    Temp: 98.2 F (36.8 C)     TempSrc: Oral     SpO2: 98% 93% 93% 94%  Weight:      Height:        Intake/Output Summary (Last 24 hours) at 03/12/17 1127 Last data filed at 03/12/17 0800  Gross per 24 hour  Intake             1365 ml  Output             1900 ml  Net             -535 ml   Filed Weights   03/06/17 1730 03/09/17 0600  Weight: 179 lb 7.3 oz (81.4 kg) 174 lb 6.1 oz (79.1 kg)    Telemetry    A paced/V sensed, no VT - Personally Reviewed  ECG    A paced, V sensed, QT prolonged - Personally Reviewed  Physical Exam   GEN: No acute distress.   Neck: No JVD Cardiac: RRR, soft SM, no rubs, or gallops.  Respiratory: CTA b/l. GI: Soft, nontender, non-distended  MS: No edema; No  deformity. Neuro:  Nonfocal  Psych: Normal affect   R radial site and R groin, both are stable, no bleeding, hematoma, are non-tender  Labs    Chemistry  Recent Labs Lab 03/10/17 0736 03/11/17 0416 03/12/17 0206  NA 134* 135 135  K 3.2* 4.2 3.9  CL 91* 96* 97*  CO2 31 29 26   GLUCOSE 109* 119* 105*  BUN 26* 33* 27*  CREATININE 1.32* 1.47* 1.22*  CALCIUM 9.0 9.1 9.1  PROT 7.6  --   --   ALBUMIN 3.2*  --   --   AST 20  --   --   ALT 14  --   --   ALKPHOS 79  --   --   BILITOT 0.6  --   --   GFRNONAA 38* 34* 42*  GFRAA 45* 39* 49*  ANIONGAP 12 10 12      Hematology  Recent Labs Lab 03/06/17 1859 03/11/17 0416 03/12/17 0206  WBC 9.0 7.7 8.2  RBC  3.43* 3.50* 3.42*  HGB 9.8* 9.8* 9.6*  HCT 30.8* 31.0* 30.3*  MCV 89.8 88.6 88.6  MCH 28.6 28.0 28.1  MCHC 31.8 31.6 31.7  RDW 17.7* 17.2* 17.1*  PLT 204 252 262    Cardiac Enzymes  Recent Labs Lab 03/05/17 1715  TROPONINI 0.16*   No results for input(s): TROPIPOC in the last 168 hours.   BNPNo results for input(s): BNP, PROBNP in the last 168 hours.   DDimer No results for input(s): DDIMER in the last 168 hours.   Radiology    No results found.  Cardiac Studies   03/11/17 LHC  Ost LM lesion, 50 %stenosed. - Eccentric Heavily Calcified lesion - MLA 7.5 mm2, FFR 0.93 - Not physiologically significant.  Prox LAD to Dist LAD Stented Segment, 0 %stenosed.  Dist LAD lesion, 90 %stenosed. Beyond Stent - Med Rx.  Prox Cx to Mid Cx Stent, 0 %stenosed.  Ost 2nd Mrg to 2nd Mrg lesion, 50 %stenosed. Beyond Stent   Angiographically, there does appear to be heavily calcified eccentric lesion in the near ostial left main.  By IVUS this lesion appeared to be moderate with significant minimal luminal area based on the baseline diameter of the Left Main.  The lesion was then reassessed using FFR and was found to be not physiologically significant.  Therefore, we decided to forego attempted PCI. Femoral arterial  access was not obtained. Plan: Continue aggressive CHF management along with antiarrhythmic management. If incessant angina or ventricular tachycardia continues, could consider distal LAD PCI, however this lesion is near apical and in a relatively small diameter area.  Cath 03/05/17 LEFT HEART CATH AND CORONARY ANGIOGRAPHY  Conclusion    Prox LAD to Dist LAD lesion, 0 %stenosed.  Dist LAD lesion, 90 %stenosed.  Prox Cx to Mid Cx lesion, 0 %stenosed.  Ost 2nd Mrg to 2nd Mrg lesion, 50 %stenosed.  Ost LM lesion, 75 %stenosed.  Mid RCA-2 lesion, 30 %stenosed.  Mid RCA-1 lesion, 30 %stenosed.  Prox RCA lesion, 30 %stenosed. 1. Patent stents mid LAD and proximal left circumflex 2. 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 90% stenosis mid to distal LAD 4. Severe, ischemic cardiomyopathy, with LVEF of 30% with anterior apical akinesis Recommendations 1. Three-vessel CABG, versus complex, high risk PCI ostial left main and distal LAD, versus medical therapy   02/05/17: TTE Study Conclusions - Left ventricle: The cavity size was mildly dilated. Wall   thickness was normal. Systolic function was moderately reduced.   The estimated ejection fraction was in the range of 35% to 40%.   Wall motion was normal; there were no regional wall motion   abnormalities. Doppler parameters are consistent with abnormal   left ventricular relaxation (grade 1 diastolic dysfunction). - Mitral valve: There was moderate regurgitation. - Left atrium: The atrium was mildly dilated. - Right ventricle: The cavity size was mildly dilated. Wall   thickness was normal. Impressions: - Moderately reduced left ventricular function   inferior apical severe hypokinesis   mild left ventricular enlargement   ejection fraction between 35 and 40%   pacer wire in RV   No cardiac source of emboli was indentified.  Patient Profile     76 y.o. female with a history of CAD, chronic systolic  CHF, VT/VF s/p ICD, DM, oxygen dependent COPD and ongoing tobacco abuse admitted initially to [facility] 03/04/17 though transferred to [facility] 03/07/17 2/2 recurrent MMVT and PMVT/torsades, suspect 2/2 progressive CAD for evaluation of revascularization options  Assessment &  Plan    1. CAD S/p cath without intervention, non-obstructive LM disease by FFR R goin and R radial sites are stable On  ASA, statin, BB, ranexa CTS felt poor CABG candidate Plans for medical management  OOB ambulating for the 1st time this AM, patient felt weak, slightly nauseated with lower BP, likely orthstatic after days in bed. Anticipate d/c tomorrow with ongoing PT given prolonged bed rest, encouraged OOB  2. Chronic combined CHF On BB, no ARB currently  Cumulatively negative 5943m  3. VT (both monomorphic and torsades) ICD in place quiet Amiodarone stopped 03/08/17  Remains on lidocaine gtt >> mexiletine with ranexa  QT prolonged today, K+ yesterday 3.9, last mag OK a few days ago, will recheck both today Plan to slow pacing rate to 70 today  4. HTN No changes today  5. HLD On statin  6. COPD (chronically O2 dependent)  7. DM  8. QT prolongation        Signed, RBaldwin Jamaica PA-C  03/12/2017, 11:27 AM     Continue ambulation; hopefully home in am   Therpay involved  Transfer to tele   QT prolongation persists,  Will decrease A pacing rate.  May be coming from ranolazine  Duo nebs discontinued  May need alternative nebRx   No interval VT  Aggressive antisischemic therapy and so will add imdur 30 BUT HOLD FOR  24-48 HRS  Until ambulating without lightheadedness  Continue statins carvedilol and ranolazine

## 2017-03-13 LAB — GLUCOSE, CAPILLARY
Glucose-Capillary: 114 mg/dL — ABNORMAL HIGH (ref 65–99)
Glucose-Capillary: 95 mg/dL (ref 65–99)
Glucose-Capillary: 107 mg/dL — ABNORMAL HIGH (ref 65–99)
Glucose-Capillary: 118 mg/dL — ABNORMAL HIGH (ref 65–99)
Glucose-Capillary: 102 mg/dL — ABNORMAL HIGH (ref 65–99)

## 2017-03-13 LAB — BASIC METABOLIC PANEL
Anion gap: 14 (ref 5–15)
BUN: 40 mg/dL — ABNORMAL HIGH (ref 6–20)
CO2: 23 mmol/L (ref 22–32)
Calcium: 9 mg/dL (ref 8.9–10.3)
Chloride: 95 mmol/L — ABNORMAL LOW (ref 101–111)
Creatinine, Ser: 1.4 mg/dL — ABNORMAL HIGH (ref 0.44–1.00)
GFR calc Af Amer: 41 mL/min — ABNORMAL LOW (ref >60)
GFR calc non Af Amer: 36 mL/min — ABNORMAL LOW (ref >60)
Glucose, Bld: 133 mg/dL — ABNORMAL HIGH (ref 65–99)
Potassium: 4.3 mmol/L (ref 3.5–5.1)
Sodium: 132 mmol/L — ABNORMAL LOW (ref 135–145)

## 2017-03-13 MED ORDER — SPIRONOLACTONE 25 MG PO TABS
12.5000 mg | ORAL_TABLET | Freq: Every day | ORAL | Status: DC
  Administered 2017-03-13: 12.5 mg via ORAL
  Filled 2017-03-13 (×2): qty 1

## 2017-03-13 MED ORDER — FUROSEMIDE 40 MG PO TABS
40.0000 mg | ORAL_TABLET | Freq: Every day | ORAL | Status: DC
  Administered 2017-03-14: 40 mg via ORAL
  Filled 2017-03-13: qty 1

## 2017-03-13 NOTE — Progress Notes (Signed)
Progress Note  Patient Name: Heather Peterson Date of Encounter: 03/13/2017  Primary Cardiologist: Dr. Saralyn Pilar  Subjective   Stronger today. Able to walk yesterday without lightheadedness. No chest pain or shortness of breath. No palpitations  Inpatient Medications    Scheduled Meds: . acetaminophen  500 mg Oral QHS  . aspirin  81 mg Oral Daily  . atorvastatin  20 mg Oral q1800  . carvedilol  3.125 mg Oral BID  . clopidogrel  75 mg Oral Daily  . furosemide  40 mg Intravenous BID  . heparin  5,000 Units Subcutaneous Q8H  . insulin aspart  0-15 Units Subcutaneous TID WC  . insulin aspart  0-5 Units Subcutaneous QHS  . levothyroxine  50 mcg Oral Daily  . mouth rinse  15 mL Mouth Rinse BID  . mexiletine  200 mg Oral Q12H  . ranolazine  500 mg Oral BID  . sodium chloride flush  3 mL Intravenous Q12H  . sodium chloride flush  3 mL Intravenous Q12H   Continuous Infusions: . sodium chloride     PRN Meds: sodium chloride, Influenza vac split quadrivalent PF, LORazepam, nitroGLYCERIN, sodium chloride, sodium chloride flush, sodium chloride flush   Vital Signs    Vitals:   03/13/17 0016 03/13/17 0018 03/13/17 0452 03/13/17 0805  BP: (!) 98/52 102/64 (!) 103/59 (!) 102/59  Pulse: 64 65 70 73  Resp: 18 18 18    Temp: 98.1 F (36.7 C) 98.5 F (36.9 C) 97.6 F (36.4 C)   TempSrc: Oral Oral Oral   SpO2: 94% 93% 94% 98%  Weight:   171 lb 6.4 oz (77.7 kg)   Height:        Intake/Output Summary (Last 24 hours) at 03/13/17 0833 Last data filed at 03/13/17 0824  Gross per 24 hour  Intake              243 ml  Output              900 ml  Net             -657 ml   Filed Weights   03/09/17 0600 03/12/17 1600 03/13/17 0452  Weight: 174 lb 6.1 oz (79.1 kg) 172 lb 2.9 oz (78.1 kg) 171 lb 6.4 oz (77.7 kg)    Telemetry    Personally reviewed  Atrial pacing without VT  ECG    A paced, V sensed,  QT still long  Physical Exam   Well developed and nourished in no  acute distress HENT normal Neck supple with JVP-flat Clear Regular rate and rhythm, no murmurs or gallops Abd-soft with active BS No Clubbing cyanosis edema Skin-warm and dry A & Oriented  Grossly normal sensory and motor function t   R radial site and R groin, both are stable, no bleeding, hematoma, are non-tender  Labs    Chemistry  Recent Labs Lab 03/10/17 0736 03/11/17 0416 03/12/17 0206 03/12/17 1145  NA 134* 135 135 134*  K 3.2* 4.2 3.9 3.6  CL 91* 96* 97* 94*  CO2 31 29 26 28   GLUCOSE 109* 119* 105* 130*  BUN 26* 33* 27* 27*  CREATININE 1.32* 1.47* 1.22* 1.31*  CALCIUM 9.0 9.1 9.1 9.1  PROT 7.6  --   --   --   ALBUMIN 3.2*  --   --   --   AST 20  --   --   --   ALT 14  --   --   --  ALKPHOS 79  --   --   --   BILITOT 0.6  --   --   --   GFRNONAA 38* 34* 42* 39*  GFRAA 45* 39* 49* 45*  ANIONGAP 12 10 12 12      Hematology  Recent Labs Lab 03/06/17 1859 03/11/17 0416 03/12/17 0206  WBC 9.0 7.7 8.2  RBC 3.43* 3.50* 3.42*  HGB 9.8* 9.8* 9.6*  HCT 30.8* 31.0* 30.3*  MCV 89.8 88.6 88.6  MCH 28.6 28.0 28.1  MCHC 31.8 31.6 31.7  RDW 17.7* 17.2* 17.1*  PLT 204 252 262    Cardiac Enzymes No results for input(s): TROPONINI in the last 168 hours. No results for input(s): TROPIPOC in the last 168 hours.   BNPNo results for input(s): BNP, PROBNP in the last 168 hours.   DDimer No results for input(s): DDIMER in the last 168 hours.   Radiology    No results found.  Cardiac Studies   03/11/17 LHC  Ost LM lesion, 50 %stenosed. - Eccentric Heavily Calcified lesion - MLA 7.5 mm2, FFR 0.93 - Not physiologically significant.  Prox LAD to Dist LAD Stented Segment, 0 %stenosed.  Dist LAD lesion, 90 %stenosed. Beyond Stent - Med Rx.  Prox Cx to Mid Cx Stent, 0 %stenosed.  Ost 2nd Mrg to 2nd Mrg lesion, 50 %stenosed. Beyond Stent   Angiographically, there does appear to be heavily calcified eccentric lesion in the near ostial left main.  By IVUS this  lesion appeared to be moderate with significant minimal luminal area based on the baseline diameter of the Left Main.  The lesion was then reassessed using FFR and was found to be not physiologically significant.  Therefore, we decided to forego attempted PCI. Femoral arterial access was not obtained. Plan: Continue aggressive CHF management along with antiarrhythmic management. If incessant angina or ventricular tachycardia continues, could consider distal LAD PCI, however this lesion is near apical and in a relatively small diameter area.  Cath 03/05/17 LEFT HEART CATH AND CORONARY ANGIOGRAPHY  Conclusion    Prox LAD to Dist LAD lesion, 0 %stenosed.  Dist LAD lesion, 90 %stenosed.  Prox Cx to Mid Cx lesion, 0 %stenosed.  Ost 2nd Mrg to 2nd Mrg lesion, 50 %stenosed.  Ost LM lesion, 75 %stenosed.  Mid RCA-2 lesion, 30 %stenosed.  Mid RCA-1 lesion, 30 %stenosed.  Prox RCA lesion, 30 %stenosed. 1. Patent stents mid LAD and proximal left circumflex 2. 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 90% stenosis mid to distal LAD 4. Severe, ischemic cardiomyopathy, with LVEF of 30% with anterior apical akinesis Recommendations 1. Three-vessel CABG, versus complex, high risk PCI ostial left main and distal LAD, versus medical therapy   02/05/17: TTE Study Conclusions - Left ventricle: The cavity size was mildly dilated. Wall   thickness was normal. Systolic function was moderately reduced.   The estimated ejection fraction was in the range of 35% to 40%.   Wall motion was normal; there were no regional wall motion   abnormalities. Doppler parameters are consistent with abnormal   left ventricular relaxation (grade 1 diastolic dysfunction). - Mitral valve: There was moderate regurgitation. - Left atrium: The atrium was mildly dilated. - Right ventricle: The cavity size was mildly dilated. Wall   thickness was normal. Impressions: - Moderately reduced left  ventricular function   inferior apical severe hypokinesis   mild left ventricular enlargement   ejection fraction between 35 and 40%   pacer wire in RV   No  cardiac source of emboli was indentified.  Patient Profile     76 y.o. female with a history of CAD, chronic systolic CHF, VT/VF s/p ICD, DM, oxygen dependent COPD and ongoing tobacco abuse admitted initially to [facility] 03/04/17 though transferred to [facility] 03/07/17 2/2 recurrent MMVT and PMVT/torsades, suspect 2/2 progressive CAD for evaluation of revascularization options  Assessment & Plan    1. CAD      2. Chronic combined CHF    3. VT (both monomorphic and torsades) I  4. HTN No changes today  5. HLD On statin  6. COPD (chronically O2 dependent)  7. DM  8. QT prolongation    No interval ventricular tachycardia. We'll continue current medications.  QT interval remains long. Some of this may be related to ranolazine. No torsadeogenic drugs in place.  Blood pressure remains low; will hold off on the initiation of nitrates.  Anticipate discharge tomorrow following further ambulation today.    Signed, Virl Axe, MD  03/13/2017, 8:33 AM     Continue ambulation; hopefully home in am   Therpay involved  Transfer to tele   QT prolongation persists,  Will decrease A pacing rate.  May be coming from ranolazine  Duo nebs discontinued  May need alternative nebRx   No interval VT  Aggressive antisischemic therapy and so will add imdur 30 BUT HOLD FOR  24-48 HRS  Until ambulating without lightheadedness  Continue statins carvedilol and ranolazine  Check BMET in am  Euvolemic. We'll discontinue IV Lasix and begin by mouth Lasix and Aldactone

## 2017-03-13 NOTE — Progress Notes (Signed)
Physical Therapy Treatment Patient Details Name: Heather Peterson MRN: [id number] DOB: 1941-03-05 Today's Date: 03/13/2017    History of Present Illness Heather Peterson is a 76 y.o. female with a hx of severe CAD, Ischemic CM with Left Main CAD & Recurrent Ventricular Tachycardia and severe COPD who is being seen for the evaluation of severe left main CAD. Options being discussed are CABG vs PCI vs medical management.    PT Comments    Pt is anxious re: feeling lightheaded "like my defibrillator was going to go off" from earlier in the day.  She thinks it is some new medication she has been placed on today.  She was able to get up and walk a short distance down the hallway with VSS (see below) and RW.  She would benefit from home therapy to help her with activity progression as I am fearful if she continues to feel lightheaded when up, she just won't get up at home.  PT will continue to follow acutely here.   Follow Up Recommendations  Home health PT;Supervision for mobility/OOB     Equipment Recommendations  Rolling walker with 5" wheels    Recommendations for Other Services   NA     Precautions / Restrictions Precautions Precautions: Fall Precaution Comments: has had multiple ICD firings and passes out each time. Monitor BPs closely    Mobility  Bed Mobility Overal bed mobility: Needs Assistance Bed Mobility: Supine to Sit;Sit to Supine     Supine to sit: Min assist;HOB elevated Sit to supine: Min guard   General bed mobility comments: Min hand held assist to support trunk from elevated HOB.  Min guard assit to ensure she lifted both legs back into the bed completely when returning to supine.   Transfers Overall transfer level: Needs assistance Equipment used: Rolling walker (2 wheeled) Transfers: Sit to/from Stand Sit to Stand: Min guard         General transfer comment: Min guard assist for safety with verbal cues to stand for 30-45 seconds to assess  lightheadedness before continuing on with gait.   Ambulation/Gait Ambulation/Gait assistance: Min guard Ambulation Distance (Feet): 85 Feet Assistive device: Rolling walker (2 wheeled) Gait Pattern/deviations: Step-through pattern;Trunk flexed Gait velocity: decreased Gait velocity interpretation: Below normal speed for age/gender General Gait Details: Verbal cues to stay inside of RW, especially while turning.  No reports of lightheadedness, but pt hesistant to walk further due to fear of not feeling well.  Pt ambulated on 2 L O2 [place].  VSS.           Balance Overall balance assessment: Needs assistance Sitting-balance support: Feet supported;No upper extremity supported Sitting balance-Leahy Scale: Good     Standing balance support: Bilateral upper extremity supported;No upper extremity supported;Single extremity supported Standing balance-Leahy Scale: Fair Standing balance comment: supervision EOB statically.                             Cognition Arousal/Alertness: Awake/alert Behavior During Therapy: Anxious Overall Cognitive Status: Within Functional Limits for tasks assessed                                 General Comments: Pt is anxious re: mobility after feeling like she was going to pass out this AM. She feels better this PM and her BP is more stable.  Pertinent Vitals/Pain Pain Assessment: No/denies pain     03/13/17 1623  Vital Signs  Pulse Rate 69  Pulse Rate Source Dinamap  BP (!) 117/59  BP Location Left Arm  BP Method Automatic  Patient Position (if appropriate) Sitting  Oxygen Therapy  SpO2 96 %  O2 Device Nasal Cannula  O2 Flow Rate (L/min) 2 L/min           PT Goals (current goals can now be found in the care plan section) Acute Rehab PT Goals Patient Stated Goal: return home Progress towards PT goals: Progressing toward goals    Frequency    Min 3X/week      PT Plan Current plan remains  appropriate       AM-PAC PT "6 Clicks" Daily Activity  Outcome Measure  Difficulty turning over in bed (including adjusting bedclothes, sheets and blankets)?: A Little Difficulty moving from lying on back to sitting on the side of the bed? : Unable Difficulty sitting down on and standing up from a chair with arms (e.g., wheelchair, bedside commode, etc,.)?: Unable Help needed moving to and from a bed to chair (including a wheelchair)?: A Little Help needed walking in hospital room?: A Little Help needed climbing 3-5 steps with a railing? : A Lot 6 Click Score: 13    End of Session Equipment Utilized During Treatment: Gait belt;Oxygen (2 L O2 [place]) Activity Tolerance: Patient limited by fatigue Patient left: in bed;with call bell/phone within reach;with family/visitor present Nurse Communication: Mobility status PT Visit Diagnosis: Unsteadiness on feet (R26.81);Muscle weakness (generalized) (M62.81)     Time: 1601-1630 PT Time Calculation (min) (ACUTE ONLY): 29 min  Charges:  $Gait Training: 8-22 mins $Therapeutic Activity: 8-22 mins      Mitsy Owen B. Seeley, [place], DPT [phone]         03/13/2017, 4:35 PM

## 2017-03-13 NOTE — Progress Notes (Signed)
Notified Dr. Berneice Gandy patients low BP's. Patient stated felt strange after taking aldactone. Stated felt like her defibrillator was going to fire. Will continue to monitor patient. Dr. Georgena Spurling parameters in for BP meds tonight.

## 2017-03-13 NOTE — Progress Notes (Signed)
Pt was eating lunch when arrived. Pt did not want to walk with Phase I because she was not feeling well. Pt stated she felt like her "defibrillator was going to go off when she became dizzy." Pt has been experiencing low blood pressures and dizziness. Pt encouraged to walk with nurse later on today. RN notified.   Carma Lair MS, ACSM CEP 1:39 PM 03/13/2017

## 2017-03-14 ENCOUNTER — Other Ambulatory Visit: Payer: Self-pay | Admitting: Nurse Practitioner

## 2017-03-14 LAB — BASIC METABOLIC PANEL
Anion gap: 12 (ref 5–15)
BUN: 39 mg/dL — ABNORMAL HIGH (ref 6–20)
CO2: 26 mmol/L (ref 22–32)
Calcium: 9.2 mg/dL (ref 8.9–10.3)
Chloride: 97 mmol/L — ABNORMAL LOW (ref 101–111)
Creatinine, Ser: 1.41 mg/dL — ABNORMAL HIGH (ref 0.44–1.00)
GFR calc Af Amer: 41 mL/min — ABNORMAL LOW (ref >60)
GFR calc non Af Amer: 35 mL/min — ABNORMAL LOW (ref >60)
Glucose, Bld: 140 mg/dL — ABNORMAL HIGH (ref 65–99)
Potassium: 3.7 mmol/L (ref 3.5–5.1)
Sodium: 135 mmol/L (ref 135–145)

## 2017-03-14 MED ORDER — MEXILETINE HCL 200 MG PO CAPS: 200.0000 mg | ORAL_CAPSULE | Freq: Two times a day (BID) | ORAL | 1 refills | Status: DC

## 2017-03-14 MED ORDER — CLOPIDOGREL BISULFATE 75 MG PO TABS: 75.0000 mg | ORAL_TABLET | Freq: Every day | ORAL | 1 refills | Status: DC

## 2017-03-14 MED ORDER — POTASSIUM CHLORIDE CRYS ER 20 MEQ PO TBCR: 20.0000 meq | EXTENDED_RELEASE_TABLET | Freq: Every day | ORAL | 1 refills | Status: DC

## 2017-03-14 MED ORDER — FUROSEMIDE 40 MG PO TABS: 40.0000 mg | ORAL_TABLET | Freq: Every day | ORAL | 1 refills | Status: DC

## 2017-03-14 MED ORDER — RANOLAZINE ER 500 MG PO TB12: 500.0000 mg | ORAL_TABLET | Freq: Two times a day (BID) | ORAL | 1 refills | Status: DC

## 2017-03-14 NOTE — Progress Notes (Signed)
1000 Offered to walk with pt. Pt stated would like to save her energy for going home. Graylon Good RN BSN 03/14/2017 9:58 AM

## 2017-03-14 NOTE — Progress Notes (Signed)
Pt and daughter continued to be worrisome last PM about pt's status after taking Aldactone. Pt feels like her defibrillator was going to fire after taking this med. Will continue to monitor.

## 2017-03-14 NOTE — Progress Notes (Signed)
Discharge instructions reviewed with patient, rolling walker to be delivered to patient Neta Mends RN 11:32 AM 03-14-2017

## 2017-03-14 NOTE — Discharge Summary (Signed)
ELECTROPHYSIOLOGY PROCEDURE DISCHARGE SUMMARY    Patient ID: Heather Peterson,  MRN: [id number], DOB/AGE: 75/17/1942 75 y.o.  Admit date: 03/06/2017 Discharge date: 03/14/2017  Primary Care Physician: Paraschos Primary Cardiologist: Caryl Comes  Primary Discharge Diagnosis:  Principal Problem:   Ventricular tachycardia [facility]) Active Problems:   CAD (coronary artery disease)   HTN (hypertension)   Chronic combined systolic and diastolic CHF (congestive heart failure) (HCC)   Left main coronary artery disease   Allergies  Allergen Reactions  . Celebrex [Celecoxib] Anaphylaxis  . Levaquin [Levofloxacin In D5w] Other (See Comments)    Heart arrhthymias  . Sulfa Antibiotics Other (See Comments)    Reaction: unknown  . Penicillins Rash    Has patient had a PCN reaction causing immediate rash, facial/tongue/throat swelling, SOB or lightheadedness with hypotension: Unknown Has patient had a PCN reaction causing severe rash involving mucus membranes or skin necrosis: No Has patient had a PCN reaction that required hospitalization: No Has patient had a PCN reaction occurring within the last 10 years: No If all of the above answers are "NO", then may proceed with Cephalosporin use.     Procedures This Admission:  1.  Cardiac catheterization on 03/11/17 by Dr Ellyn Hack demonstrated heavily calcified eccentric lesion in the near ostial left main.  By IVUS this lesion appeared to be moderate with significant minimal luminal area based on the baseline diameter of the Left Main.  The lesion was then reassessed using FFR and was found to be not physiologically significant.  Therefore, we decided to forego attempted PCI. There were no early apparent complications.   Brief HPI/Hospital Course:  Heather Peterson is a 76 y.o. female with a history of CAD, chronic systolic CHF, VT/VF s/p ICD, DM, oxygen dependent COPD and ongoing tobacco abuse Transferred from [name] for possible surgical  intervention. Heather Peterson has a history of ischemic heart disease with prior MI and 9 stenting of her circumflex in 2013 recurrent MI 5597 complicated by cardiac arrest and stenting of her LAD. LVEF 35-40% 9/18. On 9/18 Heather Peterson also had an ICD shock . Report describes ventricular fibrillation?Marland Kitchen Heather Peterson was started on amiodarone 200 mg twice a day 1 week. Heather Peterson is also discharged on carvedilol Plavix Cozaar and modest dose statins. Heather Peterson also has a history of an ICD discharge in the context of Levaquin. They recall being told that it was an interaction with fluoxetine.  Admitted to [facility] 03/04/17 after multiple syncopal episodes with several ICD delivered shocks. Interrogation revealed 13 episodes of ventricular tachycardia and ventricular fibrillation, one episode of DVT required further delivered shocks, and an episode of ventricular fibrillation requiring 3 delivered shocks. The patient was started on amiodarone bolus and drip has had no recurrent episodes.initial troponin was less than 0.03. Follow-up troponin was 0.30. Cath showed patent stents mid LAD and proximal left circumflex. However 75% ostial left main and 90% m/dLAD. Revascularization has been suggested. Transferred to [facility] for complex PCI vs CABG.  Cath results as noted above and not felt to be candidate for revascularization.  Heather Peterson was monitored on telemetry with no further VT on Mexiletine and Ranexa. Heather Peterson was seen by Dr Curt Bears and considered stable for discharge to home. Heather Peterson did not tolerate Spironolactone with hypotension and this was not given at discharge. Heather Peterson Heather Peterson be seen early in follow up next week by Dr Caryl Comes.  Physical Exam: Vitals:   03/13/17 2135 03/14/17 0207 03/14/17 0523 03/14/17 0800  BP: (!) 109/54  (!) 91/59 124/64  Pulse:  71  69 70  Resp: 18  18 18   Temp: 98.2 F (36.8 C)  97.9 F (36.6 C) 98.4 F (36.9 C)  TempSrc: Oral  Oral Oral  SpO2: 100%  100% 98%  Weight:  171 lb 6.4 oz (77.7 kg)    Height:        GEN- The patient is  well appearing, alert and oriented x 3 today.   HEENT: normocephalic, atraumatic; sclera clear, conjunctiva pink; hearing intact; oropharynx clear; neck supple  Lungs- Clear to ausculation bilaterally, normal work of breathing.  No wheezes, rales, rhonchi Heart- Regular rate and rhythm  GI- soft, non-tender, non-distended, bowel sounds present Extremities- no clubbing, cyanosis, or edema  MS- no significant deformity or atrophy Skin- warm and dry, no rash or lesion Psych- euthymic mood, full affect Neuro- strength and sensation are intact    Labs:   Lab Results  Component Value Date   WBC 8.2 03/12/2017   HGB 9.6 (L) 03/12/2017   HCT 30.3 (L) 03/12/2017   MCV 88.6 03/12/2017   PLT 262 03/12/2017    Recent Labs Lab 03/10/17 0736  03/14/17 0458  NA 134*  < > 135  K 3.2*  < > 3.7  CL 91*  < > 97*  CO2 31  < > 26  BUN 26*  < > 39*  CREATININE 1.32*  < > 1.41*  CALCIUM 9.0  < > 9.2  PROT 7.6  --   --   BILITOT 0.6  --   --   ALKPHOS 79  --   --   ALT 14  --   --   AST 20  --   --   GLUCOSE 109*  < > 140*  < > = values in this interval not displayed.   Discharge Medications:  Current Discharge Medication List    START taking these medications   Details  clopidogrel (PLAVIX) 75 MG tablet Take 1 tablet (75 mg total) by mouth daily. Qty: 30 tablet, Refills: 1    furosemide (LASIX) 40 MG tablet Take 1 tablet (40 mg total) by mouth daily. Qty: 30 tablet, Refills: 1    mexiletine (MEXITIL) 200 MG capsule Take 1 capsule (200 mg total) by mouth every 12 (twelve) hours. Qty: 60 capsule, Refills: 1    potassium chloride SA (K-DUR,KLOR-CON) 20 MEQ tablet Take 1 tablet (20 mEq total) by mouth daily. Qty: 30 tablet, Refills: 1    ranolazine (RANEXA) 500 MG 12 hr tablet Take 1 tablet (500 mg total) by mouth 2 (two) times daily. Qty: 60 tablet, Refills: 1      CONTINUE these medications which have NOT CHANGED   Details  acetaminophen (TYLENOL) 500 MG tablet Take 500 mg by  mouth at bedtime.    albuterol (PROVENTIL HFA;VENTOLIN HFA) 108 (90 Base) MCG/ACT inhaler Inhale 2 puffs into the lungs every 4 (four) hours as needed for wheezing or shortness of breath. Qty: 1 Inhaler, Refills: 1    carvedilol (COREG) 3.125 MG tablet Take 3.125 mg by mouth 2 (two) times daily. Refills: 6    Fluticasone-Salmeterol (ADVAIR DISKUS) 250-50 MCG/DOSE AEPB Inhale 1 puff into the lungs 2 (two) times daily. Qty: 1 each, Refills: 1    levothyroxine (SYNTHROID, LEVOTHROID) 50 MCG tablet Take 50 mcg by mouth daily. Refills: 3    metFORMIN (GLUCOPHAGE-XR) 750 MG 24 hr tablet Take 750 mg by mouth daily with breakfast.    Multiple Vitamin (MULTIVITAMIN WITH MINERALS) TABS tablet Take 1 tablet by mouth  daily.    niacin 500 MG CR capsule Take 1,500 mg by mouth 2 (two) times daily with a meal.     omega-3 acid ethyl esters (LOVAZA) 1 g capsule Take 1 g by mouth daily.     simvastatin (ZOCOR) 40 MG tablet Take 20 mg by mouth at bedtime.  Refills: 2    nitroGLYCERIN (NITROSTAT) 0.4 MG SL tablet Place 0.4 mg under the tongue every 5 (five) minutes as needed for chest pain.      STOP taking these medications     FLUoxetine (PROZAC) 20 MG capsule      losartan (COZAAR) 50 MG tablet      amiodarone (NEXTERONE PREMIX) 360-4.14 MG/200ML-% SOLN         Disposition: Pt is being discharged home today in good condition. Discharge Instructions    Diet - low sodium heart healthy    Complete by:  As directed    Discharge instructions    Complete by:  As directed    Radial Site Care Refer to this sheet in the next few weeks. These instructions provide you with information on caring for yourself after your procedure. Your caregiver may also give you more specific instructions. Your treatment has been planned according to current medical practices, but problems sometimes occur. Call your caregiver if you have any problems or questions after your procedure. HOME CARE INSTRUCTIONS You  may shower the day after the procedure.Remove the bandage (dressing) and gently wash the site with plain soap and water.Gently pat the site dry.  Do not apply powder or lotion to the site.  Do not submerge the affected site in water for 3 to 5 days.  Inspect the site at least twice daily.  Do not flex or bend the affected arm for 24 hours.  No lifting over 5 pounds (2.3 kg) for 5 days after your procedure.  Do not drive home if you are discharged the same day of the procedure. Have someone else drive you.  You may drive 24 hours after the procedure unless otherwise instructed by your caregiver.  What to expect: Any bruising Heather Peterson usually fade within 1 to 2 weeks.  Blood that collects in the tissue (hematoma) may be painful to the touch. It should usually decrease in size and tenderness within 1 to 2 weeks.  SEEK IMMEDIATE MEDICAL CARE IF: You have unusual pain at the radial site.  You have redness, warmth, swelling, or pain at the radial site.  You have drainage (other than a small amount of blood on the dressing).  You have chills.  You have a fever or persistent symptoms for more than 72 hours.  You have a fever and your symptoms suddenly get worse.  Your arm becomes pale, cool, tingly, or numb.  You have heavy bleeding from the site. Hold pressure on the site.   Increase activity slowly    Complete by:  As directed      Follow-up Information    Deboraha Sprang, MD Follow up on 03/20/2017.   Specialty:  Cardiology Why:  at 11:45AM Contact information: [place] [address] [phone]           Duration of Discharge Encounter: Greater than 30 minutes including physician time.  Signed, Chanetta Marshall, NP 03/14/2017 9:07 AM  I have seen and examined this patient with Chanetta Marshall.  Agree with above, note added to reflect my findings.  On exam, RRR, no murmurs, lungs clear.  Heather Peterson  was admitted to the hospital after multiple syncopal  episodes and ICD discharges.  Heather Peterson was initially started on amiodarone, but had torsades.  Heather Peterson was switched to mexiletine and Ranexa.  Heather Peterson had a left heart catheterization that showed an eccentric left main plaque.  FFR showed that the plaque was not flow-limiting.  Her VT significantly decreased and Heather Peterson has had none on mexiletine and Ranexa without amiodarone.  Plan for discharge today with follow-up in clinic.  Godric Lavell M. Moo Gravley MD 03/14/2017 10:04 AM

## 2017-03-14 NOTE — Progress Notes (Signed)
Rolling walker ordered as requested. Mindi Slicker [facility] [phone]

## 2017-03-14 NOTE — Progress Notes (Signed)
Pt discharge education provided at bedside. Awaiting pt walker delivery and pt transportation

## 2017-03-18 ENCOUNTER — Telehealth: Payer: Self-pay | Admitting: Internal Medicine

## 2017-03-18 NOTE — Telephone Encounter (Signed)
Called and s/w patient's husband.  BP readings this morning 103/62. 1150am 100/59 3:30pm   100/55, HR 69 Patient has pacemaker set at 70 per husband. Patient feels very tired and dizzy. Denies chest pain or palpitations. Shortness of breath is at her baseline for hx COPD. Reviewed patient's medication list.  Patient then got on phone.  She stated, "I'm so tired I cannot get up and he has to help me." Weight has maintained at 170.7 lb since returning home. Today, decreased to 169.2lb.  Ignacia Bayley, NP reviewed patient complaint and chart. He advised for her to hold coreg tonight, then hold coreg in the morning is SBP<110. Patient verbalized understanding. Patient also has appointment with Dr Caryl Comes tomorrow at 11:45am and is aware to keep appointment.

## 2017-03-18 NOTE — Telephone Encounter (Signed)
Pt spouse calling, knows pt is coming in the morning to see Dr Caryl Comes He is wanting to know what they can do tonight to help patient He states her BP is really low She is very tired  Would like some advise on this   Please call back

## 2017-03-19 ENCOUNTER — Encounter: Payer: Self-pay | Admitting: Internal Medicine

## 2017-03-19 ENCOUNTER — Ambulatory Visit: Payer: Medicare Other | Admitting: Internal Medicine

## 2017-03-19 VITALS — BP 98/68 | HR 69 | Ht 64.0 in | Wt 170.5 lb

## 2017-03-19 DIAGNOSIS — T50905A Adverse effect of unspecified drugs, medicaments and biological substances, initial encounter: Secondary | ICD-10-CM

## 2017-03-19 DIAGNOSIS — I472 Ventricular tachycardia, unspecified: Secondary | ICD-10-CM

## 2017-03-19 DIAGNOSIS — I5042 Chronic combined systolic (congestive) and diastolic (congestive) heart failure: Secondary | ICD-10-CM | POA: Diagnosis not present

## 2017-03-19 DIAGNOSIS — E876 Hypokalemia: Secondary | ICD-10-CM | POA: Diagnosis not present

## 2017-03-19 DIAGNOSIS — I952 Hypotension due to drugs: Secondary | ICD-10-CM | POA: Diagnosis not present

## 2017-03-19 DIAGNOSIS — I4581 Long QT syndrome: Secondary | ICD-10-CM | POA: Diagnosis not present

## 2017-03-19 DIAGNOSIS — I4721 Torsades de pointes: Secondary | ICD-10-CM

## 2017-03-19 DIAGNOSIS — I2510 Atherosclerotic heart disease of native coronary artery without angina pectoris: Secondary | ICD-10-CM

## 2017-03-19 DIAGNOSIS — Z7689 Persons encountering health services in other specified circumstances: Secondary | ICD-10-CM | POA: Diagnosis not present

## 2017-03-19 DIAGNOSIS — R9431 Abnormal electrocardiogram [ECG] [EKG]: Secondary | ICD-10-CM

## 2017-03-19 MED ORDER — METOPROLOL SUCCINATE ER 25 MG PO TB24: 12.5000 mg | ORAL_TABLET | Freq: Every day | ORAL | 3 refills | Status: DC

## 2017-03-19 NOTE — Patient Instructions (Addendum)
Medication Instructions:  Your physician has recommended you make the following change in your medication:  STOP taking lasix STOP taking niacin STOP taking coreg START taking metoprolol 12.44m (1/2 tablet) once daily   Labwork: Potassium level today  Testing/Procedures: none  Follow-Up: Your physician recommends that you schedule a follow-up appointment in: 10 weeks with Dr. KCaryl Comes    Any Other Special Instructions Will Be Listed Below (If Applicable).     If you need a refill on your cardiac medications before your next appointment, please call your pharmacy.

## 2017-03-19 NOTE — Progress Notes (Signed)
[name], MD as PCP - General (Family Medicine)   HPI  Heather Peterson is a 76 y.o. female Seen in followup for ventricular tachycardia--flutter for which she was treated initially with amiodarone after she presented with multiple shocks via her [place] ICD  She has a history of ischemic heart disease with prior MI and 9 stenting of her circumflex in 2013 recurrent MI 2694 complicated by cardiac arrest and stenting of her LAD. LVEF 35-40% 9/18.  She underwent cath ? 75% LM and transferred to [facility] for revascularization  Turned down by CVTS and IVUS at Cath >> 50 % and medical therapy was recommended.  She has a history also of drug-induced torsades occurring last summer with Levaquin.   Upon arrival at [facility] she was treated with amiodarone and subsequently developed torsade.  She was treated with increasing her atrial pacing rate--80, lidocaine was used at adjunctively, ranolazine was added in addition the amiodarone was tapered over the next 48 hours; she had no further ventricular tachycardia  She had presented also in September 2018 with an ICD shock.  Both presentations with ICD shocks had been associated with hypokalemia.  Is now on potassium replacement  She has oxygen dependent COPD.  Feeling really tired.  Her blood pressures have been in the high 90s.  This is occurred in the past with efforts to use carvedilol.   Past Medical History:  Diagnosis Date  . Chronic combined systolic and diastolic CHF (congestive heart failure) ([place])   . Diabetes mellitus without complication ([place])   . Hypertension   . MI (myocardial infarction) ([place])    x 5  . Pacemaker     Past Surgical History:  Procedure Laterality Date  . pacemaker/defibrillator Left     Current Outpatient Medications  Medication Sig Dispense Refill  . acetaminophen (TYLENOL) 500 MG tablet Take 500 mg by mouth at bedtime.    Marland Kitchen albuterol (PROVENTIL HFA;VENTOLIN HFA) 108 (90  Base) MCG/ACT inhaler Inhale 2 puffs into the lungs every 4 (four) hours as needed for wheezing or shortness of breath. 1 Inhaler 1  . clopidogrel (PLAVIX) 75 MG tablet Take 1 tablet (75 mg total) by mouth daily. 30 tablet 1  . Fluticasone-Salmeterol (ADVAIR DISKUS) 250-50 MCG/DOSE AEPB Inhale 1 puff into the lungs 2 (two) times daily. 1 each 1  . furosemide (LASIX) 40 MG tablet Take 1 tablet (40 mg total) by mouth daily. 30 tablet 1  . levothyroxine (SYNTHROID, LEVOTHROID) 50 MCG tablet Take 50 mcg by mouth daily.  3  . metFORMIN (GLUCOPHAGE-XR) 750 MG 24 hr tablet Take 750 mg by mouth daily with breakfast.    . mexiletine (MEXITIL) 200 MG capsule Take 1 capsule (200 mg total) by mouth every 12 (twelve) hours. 60 capsule 1  . Multiple Vitamin (MULTIVITAMIN WITH MINERALS) TABS tablet Take 1 tablet by mouth daily.    . niacin 500 MG CR capsule Take 1,500 mg by mouth 2 (two) times daily with a meal.     . nitroGLYCERIN (NITROSTAT) 0.4 MG SL tablet Place 0.4 mg under the tongue every 5 (five) minutes as needed for chest pain.    Marland Kitchen omega-3 acid ethyl esters (LOVAZA) 1 g capsule Take 1 g by mouth daily.     . potassium chloride SA (K-DUR,KLOR-CON) 20 MEQ tablet Take 1 tablet (20 mEq total) by mouth daily. 30 tablet 1  . ranolazine (RANEXA) 500 MG 12 hr tablet Take 1 tablet (500  mg total) by mouth 2 (two) times daily. 60 tablet 1  . simvastatin (ZOCOR) 40 MG tablet Take 20 mg by mouth at bedtime.   2  . carvedilol (COREG) 3.125 MG tablet Take 3.125 mg by mouth 2 (two) times daily.  6   No current facility-administered medications for this visit.     Allergies  Allergen Reactions  . Celebrex [Celecoxib] Anaphylaxis  . Glipizide Anaphylaxis  . Sulfa Antibiotics Other (See Comments) and Anaphylaxis    Reaction: unknown  . Levaquin [Levofloxacin In D5w] Other (See Comments)    Heart arrhthymias  . Levofloxacin Other (See Comments)    ICD fired  . Metformin Other (See Comments)    Gi tolerance     . Penicillins Rash and Other (See Comments)    Has patient had a PCN reaction causing immediate rash, facial/tongue/throat swelling, SOB or lightheadedness with hypotension: Unknown Has patient had a PCN reaction causing severe rash involving mucus membranes or skin necrosis: No Has patient had a PCN reaction that required hospitalization: No Has patient had a PCN reaction occurring within the last 10 years: No If all of the above answers are "NO", then may proceed with Cephalosporin use.       Review of Systems negative except from HPI and PMH  Physical Exam BP 98/68 (BP Location: Left Arm, Patient Position: Sitting, Cuff Size: Normal)   Pulse 69   Ht 5' 4"  (1.626 m)   Wt 170 lb 8 oz (77.3 kg)   BMI 29.27 kg/m  Well developed and well nourished in no acute distress wearing O2 HENT normal E scleral and icterus clear Neck Supple JVP flat; carotids brisk and full Clear to ausculation  Regular rate and rhythm, no murmurs gallops or rub Soft with active bowel sounds No clubbing cyanosis tr Edema Alert and oriented, grossly normal motor and sensory function Skin Warm and Dry  ECG demonstrates atrial paced at 70 Intervals 20/10/44  Assessment and  Plan  Ventricular flutter/tachycardia-monomorphic/  Torsade de pointes-drug-induced  Cardiomyopathy nonischemic/ischemic  COPD-oxygen dependent  Hypotension  Hypokalemia-recurrent  No interval ventricular tachycardia monomorphic or polymorphic  I will need to look at the most recent recommendations for gene testing for drug induced torsade  We will check potassium levels today.  Will need to be checked frequently until we know that they will be stable.  With her hypotension we will stop carvedilol.  But will attempt to resume metoprolol succinate in its place; the absence of an alpha blocker may allow her to tolerate beta-blocker effect better with less hypotension  She is euvolemic.  We will discontinue the Lasix  We  will continue potassium repletion as noted     Current medicines are reviewed at length with the patient today .  The patient does  have concerns regarding medicines As above

## 2017-03-20 ENCOUNTER — Ambulatory Visit: Payer: Medicare (Managed Care) | Admitting: Internal Medicine

## 2017-03-20 ENCOUNTER — Telehealth: Payer: Self-pay | Admitting: Internal Medicine

## 2017-03-20 LAB — POTASSIUM: Potassium: 6 mmol/L — ABNORMAL HIGH (ref 3.5–5.2)

## 2017-03-20 NOTE — Telephone Encounter (Signed)
Pt husband called and states per [facility] passport, pt does not have to have a referral.

## 2017-03-24 ENCOUNTER — Telehealth: Payer: Self-pay | Admitting: Internal Medicine

## 2017-03-24 ENCOUNTER — Other Ambulatory Visit
Admission: RE | Admit: 2017-03-24 | Discharge: 2017-03-24 | Disposition: A | Discharge status: Home / Self Care | Payer: Medicare Other | Source: Ambulatory Visit | Attending: Internal Medicine | Admitting: Internal Medicine

## 2017-03-24 ENCOUNTER — Other Ambulatory Visit: Payer: Self-pay

## 2017-03-24 DIAGNOSIS — E875 Hyperkalemia: Secondary | ICD-10-CM | POA: Insufficient documentation

## 2017-03-24 LAB — POTASSIUM: Potassium: 4.3 mmol/L (ref 3.5–5.1)

## 2017-03-24 NOTE — Telephone Encounter (Signed)
Patient has a defib and has a cold and wants to know what is safe to take to help congestion and secretions   Please call

## 2017-03-24 NOTE — Telephone Encounter (Signed)
Spoke with patient and she has a cold which she wants to know what can she take for this over the counter. She reports being up all night with cough and has nasal congestion with drainage. Reviewed with her that the generic version of Coricidin HBP is one that is most recommended for cardiac patients. She really wants to know from Dr. Caryl Comes what she can take because she doesn't want her device to go off. She states that they have been trying to figure out which medications could be causing her problems so she wants him to recommend something over the counter. Advised her that he is not in our office today but that I would route this to him for review. She was appreciative for the call back with no further questions at this time.

## 2017-03-25 ENCOUNTER — Telehealth: Payer: Self-pay

## 2017-03-25 NOTE — Telephone Encounter (Signed)
F/u Message  Pt c/o BP issue: STAT if pt c/o blurred vision, one-sided weakness or slurred speech  1. What are your last 5 BP readings? 98/44  2. Are you having any other symptoms (ex. Dizziness, headache, blurred vision, passed out)? Pt per husband pt is sick with bronchitis   3. What is your BP issue? Pt husband call requesting to speak with RN about pts cold. Please call back to discuss

## 2017-03-25 NOTE — Telephone Encounter (Signed)
Have her stop her coreg She can take anything for her cold as long as does not have D in it, ie decongestant

## 2017-03-25 NOTE — Telephone Encounter (Signed)
Left voicemail message to call back regarding changes in medication.

## 2017-03-25 NOTE — Telephone Encounter (Signed)
Pt it aware and agreeable to normal results. She states she called the [place] office to be seen today as she is experiencing low BP today. She is agreeable to taking K every other day. And having repeat K level check next week.

## 2017-03-25 NOTE — Telephone Encounter (Signed)
-----   Message from Deboraha Sprang, MD sent at 03/24/2017  4:02 PM EST ----- Please Inform Patient that labs are normal   Will need K checked early next week  Have her take her K every toher day  Thanks

## 2017-03-25 NOTE — Telephone Encounter (Signed)
Left voicemail message to call back  

## 2017-03-26 NOTE — Telephone Encounter (Signed)
Spoke with patient and she states that she went to the urgent care and they provided her with medications. She states that she has stopped the coreg from previous visit with Dr. Caryl Comes. Reviewed recommendations to avoid any medications with decongestant and to please let us know if she should have any further questions. Instructed her to try and stay hydrated while she is sick and to call back if she should have any further concerns. She verbalized understanding of our conversation and was appreciative for the call back.

## 2017-03-27 ENCOUNTER — Other Ambulatory Visit: Payer: Self-pay

## 2017-03-27 DIAGNOSIS — I5042 Chronic combined systolic (congestive) and diastolic (congestive) heart failure: Secondary | ICD-10-CM

## 2017-03-27 DIAGNOSIS — I10 Essential (primary) hypertension: Secondary | ICD-10-CM

## 2017-03-31 ENCOUNTER — Other Ambulatory Visit (INDEPENDENT_AMBULATORY_CARE_PROVIDER_SITE_OTHER): Payer: Medicare Other

## 2017-03-31 DIAGNOSIS — I5042 Chronic combined systolic (congestive) and diastolic (congestive) heart failure: Secondary | ICD-10-CM | POA: Diagnosis not present

## 2017-03-31 DIAGNOSIS — I10 Essential (primary) hypertension: Secondary | ICD-10-CM

## 2017-04-01 LAB — BASIC METABOLIC PANEL
BUN/Creatinine Ratio: 26 (ref 12–28)
BUN: 31 mg/dL — ABNORMAL HIGH (ref 8–27)
CO2: 23 mmol/L (ref 20–29)
Calcium: 9.2 mg/dL (ref 8.7–10.3)
Chloride: 101 mmol/L (ref 96–106)
Creatinine, Ser: 1.17 mg/dL — ABNORMAL HIGH (ref 0.57–1.00)
GFR calc Af Amer: 53 mL/min/{1.73_m2} — ABNORMAL LOW (ref >59)
GFR calc non Af Amer: 46 mL/min/{1.73_m2} — ABNORMAL LOW (ref >59)
Glucose: 100 mg/dL — ABNORMAL HIGH (ref 65–99)
Potassium: 4.3 mmol/L (ref 3.5–5.2)
Sodium: 140 mmol/L (ref 134–144)

## 2017-04-15 LAB — CUP PACEART INCLINIC DEVICE CHECK
Battery Remaining Longevity: 58 mo
Brady Statistic RA Percent Paced: 95 %
Brady Statistic RV Percent Paced: 0 %
Date Time Interrogation Session: 20181107181401
HighPow Impedance: 40 Ohm
Implantable Lead Implant Date: 20151229
Implantable Lead Implant Date: 20151229
Implantable Lead Location: 753859
Implantable Lead Location: 753860
Implantable Pulse Generator Implant Date: 20151229
Lead Channel Impedance Value: 375 Ohm
Lead Channel Impedance Value: 550 Ohm
Lead Channel Pacing Threshold Amplitude: 0.5 V
Lead Channel Pacing Threshold Amplitude: 0.75 V
Lead Channel Pacing Threshold Pulse Width: 0.5 ms
Lead Channel Pacing Threshold Pulse Width: 0.5 ms
Lead Channel Sensing Intrinsic Amplitude: 0.4 mV
Lead Channel Sensing Intrinsic Amplitude: 11.7 mV
Lead Channel Setting Pacing Amplitude: 2.5 V
Lead Channel Setting Pacing Amplitude: 2.5 V
Lead Channel Setting Pacing Pulse Width: 0.5 ms
Lead Channel Setting Sensing Sensitivity: 0.5 mV
Pulse Gen Serial Number: 7242721

## 2017-05-20 ENCOUNTER — Encounter: Payer: Medicare Other | Admitting: Internal Medicine

## 2017-07-01 ENCOUNTER — Ambulatory Visit (INDEPENDENT_AMBULATORY_CARE_PROVIDER_SITE_OTHER): Payer: Medicare Other | Admitting: Internal Medicine

## 2017-07-01 ENCOUNTER — Encounter: Payer: Self-pay | Admitting: Internal Medicine

## 2017-07-01 VITALS — BP 100/64 | HR 70 | Ht 64.0 in | Wt 183.8 lb

## 2017-07-01 DIAGNOSIS — I472 Ventricular tachycardia, unspecified: Secondary | ICD-10-CM

## 2017-07-01 DIAGNOSIS — I428 Other cardiomyopathies: Secondary | ICD-10-CM | POA: Diagnosis not present

## 2017-07-01 DIAGNOSIS — I4721 Torsades de pointes: Secondary | ICD-10-CM

## 2017-07-01 DIAGNOSIS — I959 Hypotension, unspecified: Secondary | ICD-10-CM

## 2017-07-01 DIAGNOSIS — Z95810 Presence of automatic (implantable) cardiac defibrillator: Secondary | ICD-10-CM

## 2017-07-01 DIAGNOSIS — E876 Hypokalemia: Secondary | ICD-10-CM | POA: Diagnosis not present

## 2017-07-01 DIAGNOSIS — I255 Ischemic cardiomyopathy: Secondary | ICD-10-CM | POA: Diagnosis not present

## 2017-07-01 NOTE — Patient Instructions (Signed)
Labwork: CBC today   Follow-Up: Your physician recommends that you schedule a follow-up appointment with Dr. Saralyn Pilar    Any Other Special Instructions Will Be Listed Below (If Applicable).     If you need a refill on your cardiac medications before your next appointment, please call your pharmacy.

## 2017-07-01 NOTE — Progress Notes (Signed)
[name], MD as PCP - General (Family Medicine)   HPI  Heather Peterson is a 77 y.o. female Seen in followup for ventricular tachycardia--flutter for which she was treated initially with amiodarone after she presented with multiple shocks via her [place] ICD  She has a history of ischemic heart disease with prior MI and 9 stenting of her circumflex in 2013 recurrent MI 5284 complicated by cardiac arrest and stenting of her LAD. LVEF 35-40% 9/18.  She underwent cath ? 75% LM and transferred to [facility] for revascularization  Turned down by CVTS and IVUS at Cath >> 50 % and medical therapy was recommended.  She has a history also of drug-induced torsades 12/18 w Levaquin  Upon arrival at [facility] she was treated with amiodarone and subsequently developed torsade.  She was treated with increasing her atrial pacing rate--80, lidocaine was used at adjunctively, ranolazine was added in addition the amiodarone was tapered over the next 48 hours; she had no further ventricular tachycardia   She has oxygen dependent COPD.  Feeling really tired.  Her blood pressures have been in the high 90s.  This is occurred in the past with efforts to use carvedilol.   DATE TEST EF   9/18 Echo   35-40 %   10/18 Cath    Ost LM lesion, 50 %stenosed.   FFR 0.93 - Not physiologically significant. LAD proximal stent patent with distal 90% lesion          Date Cr K Hgb  11/18 1.4 6.0 9.6  11/18 1.17 4.3 7.9  1/19 1.4 4.5     No interval shocks.  Significant fatigue.  Somewhat pale.  No chest pain.  Past Medical History:  Diagnosis Date  . Chronic combined systolic and diastolic CHF (congestive heart failure) ([place])   . Diabetes mellitus without complication ([place])   . Hypertension   . MI (myocardial infarction) ([place])    x 5  . Pacemaker     Past Surgical History:  Procedure Laterality Date  . CENTRAL LINE INSERTION  03/11/2017   Procedure: CENTRAL LINE INSERTION;   Surgeon: Leonie Man, MD;  Location: [place] [facility];  Service: Cardiovascular;;  . CORONARY STENT INTERVENTION W/IMPELLA N/A 03/11/2017   Procedure: Coronary Stent Intervention w/Impella;  Surgeon: Leonie Man, MD;  Location: [place] [facility];  Service: Cardiovascular;  Laterality: N/A;  . INTRAVASCULAR PRESSURE WIRE/FFR STUDY N/A 03/11/2017   Procedure: INTRAVASCULAR PRESSURE WIRE/FFR STUDY;  Surgeon: Leonie Man, MD;  Location: [place] [facility];  Service: Cardiovascular;  Laterality: N/A;  . INTRAVASCULAR ULTRASOUND/IVUS N/A 03/11/2017   Procedure: Intravascular Ultrasound/IVUS;  Surgeon: Leonie Man, MD;  Location: [place] [facility];  Service: Cardiovascular;  Laterality: N/A;  . LEFT HEART CATH AND CORONARY ANGIOGRAPHY N/A 03/05/2017   Procedure: LEFT HEART CATH AND CORONARY ANGIOGRAPHY;  Surgeon: Isaias Cowman, MD;  Location: [place] [facility];  Service: Cardiovascular;  Laterality: N/A;  . pacemaker/defibrillator Left     Current Outpatient Medications  Medication Sig Dispense Refill  . acetaminophen (TYLENOL) 500 MG tablet Take 500 mg by mouth at bedtime.    Marland Kitchen albuterol (PROVENTIL HFA;VENTOLIN HFA) 108 (90 Base) MCG/ACT inhaler Inhale 2 puffs into the lungs every 4 (four) hours as needed for wheezing or shortness of breath. 1 Inhaler 1  . clopidogrel (PLAVIX) 75 MG tablet Take 1 tablet (75 mg total) by mouth daily. 30 tablet 1  .  Fluticasone-Salmeterol (ADVAIR DISKUS) 250-50 MCG/DOSE AEPB Inhale 1 puff into the lungs 2 (two) times daily. 1 each 1  . levothyroxine (SYNTHROID, LEVOTHROID) 50 MCG tablet Take 50 mcg by mouth daily.  3  . metFORMIN (GLUCOPHAGE-XR) 750 MG 24 hr tablet Take 750 mg by mouth daily with breakfast.    . metoprolol succinate (TOPROL-XL) 25 MG 24 hr tablet Take 0.5 tablets (12.5 mg total) daily by mouth. Take with or immediately following a meal. 30 tablet 3  . mexiletine (MEXITIL) 200 MG capsule Take 1 capsule (200 mg total) by  mouth every 12 (twelve) hours. 60 capsule 1  . Multiple Vitamin (MULTIVITAMIN WITH MINERALS) TABS tablet Take 1 tablet by mouth daily.    . nitroGLYCERIN (NITROSTAT) 0.4 MG SL tablet Place 0.4 mg under the tongue every 5 (five) minutes as needed for chest pain.    Marland Kitchen omega-3 acid ethyl esters (LOVAZA) 1 g capsule Take 1 g by mouth daily.     . potassium chloride SA (K-DUR,KLOR-CON) 20 MEQ tablet Take 1 tablet (20 mEq total) by mouth daily. 30 tablet 1  . ranolazine (RANEXA) 500 MG 12 hr tablet Take 1 tablet (500 mg total) by mouth 2 (two) times daily. 60 tablet 1  . simvastatin (ZOCOR) 40 MG tablet Take 20 mg by mouth at bedtime.   2   No current facility-administered medications for this visit.     Allergies  Allergen Reactions  . Celebrex [Celecoxib] Anaphylaxis  . Glipizide Anaphylaxis  . Sulfa Antibiotics Other (See Comments) and Anaphylaxis    Reaction: unknown  . Levaquin [Levofloxacin In D5w] Other (See Comments)    Heart arrhthymias  . Levofloxacin Other (See Comments)    ICD fired  . Metformin Other (See Comments)    Gi tolerance   . Penicillins Rash and Other (See Comments)    Has patient had a PCN reaction causing immediate rash, facial/tongue/throat swelling, SOB or lightheadedness with hypotension: Unknown Has patient had a PCN reaction causing severe rash involving mucus membranes or skin necrosis: No Has patient had a PCN reaction that required hospitalization: No Has patient had a PCN reaction occurring within the last 10 years: No If all of the above answers are "NO", then may proceed with Cephalosporin use.       Review of Systems negative except from HPI and PMH  Physical Exam BP 100/64 (BP Location: Left Arm, Patient Position: Sitting, Cuff Size: Normal)   Pulse 70   Ht 5' 4"  (1.626 m)   Wt 183 lb 12 oz (83.3 kg)   BMI 31.54 kg/m  Well developed and nourished in no acute distress using oxygen  HENT normal Neck supple with JVP-flat Clear Regular rate  and rhythm, no murmurs or gallops Abd-soft with active BS No Clubbing cyanosis edema Skin-warm and dry A & Oriented  Grossly normal sensory and motor function   ECG demonstrates atrial paced at 70 Interval 21/10/48 Low voltage  Assessment and  Plan  Ventricular flutter/tachycardia-monomorphic/  Torsade de pointes-drug-induced  Cardiomyopathy nonischemic/ischemic  COPD-oxygen dependent  Hypotension  Hypokalemia-recurrent  Anemia  No interval ventricular tachycardia.  We will continue current medications.  She has noted alopecia.  She is reluctant to hold her beta-blocker.  With her pallor and her last hemoglobin be less than 8 we will recheck her hemoglobin today.  She would prefer to follow-up with Dr. Bunnie Pion.  We will arrange transfer of her [place]   We spent more than 50% of our >25 min visit in face  to face counseling regarding the above   Current medicines are reviewed at length with the patient today .  The patient does  have concerns regarding medicines As above

## 2017-07-02 LAB — CBC WITH DIFFERENTIAL/PLATELET
Basophils Absolute: 0 10*3/uL (ref 0.0–0.2)
Basos: 0 %
EOS (ABSOLUTE): 0.1 10*3/uL (ref 0.0–0.4)
Eos: 1 %
Hematocrit: 31.1 % — ABNORMAL LOW (ref 34.0–46.6)
Hemoglobin: 10 g/dL — ABNORMAL LOW (ref 11.1–15.9)
Immature Grans (Abs): 0.1 10*3/uL (ref 0.0–0.1)
Immature Granulocytes: 1 %
Lymphocytes Absolute: 2.3 10*3/uL (ref 0.7–3.1)
Lymphs: 29 %
MCH: 28.1 pg (ref 26.6–33.0)
MCHC: 32.2 g/dL (ref 31.5–35.7)
MCV: 87 fL (ref 79–97)
Monocytes Absolute: 0.9 10*3/uL (ref 0.1–0.9)
Monocytes: 11 %
Neutrophils Absolute: 4.7 10*3/uL (ref 1.4–7.0)
Neutrophils: 58 %
Platelets: 299 10*3/uL (ref 150–379)
RBC: 3.56 x10E6/uL — ABNORMAL LOW (ref 3.77–5.28)
RDW: 16.6 % — ABNORMAL HIGH (ref 12.3–15.4)
WBC: 8.1 10*3/uL (ref 3.4–10.8)

## 2017-09-05 ENCOUNTER — Encounter: Payer: Self-pay | Admitting: Emergency Medicine

## 2017-09-05 ENCOUNTER — Emergency Department
Admission: EM | Admit: 2017-09-05 | Discharge: 2017-09-05 | Disposition: A | Discharge status: Home / Self Care | Payer: Medicare Other | Attending: Emergency Medicine | Admitting: Emergency Medicine

## 2017-09-05 ENCOUNTER — Other Ambulatory Visit: Payer: Self-pay

## 2017-09-05 DIAGNOSIS — R509 Fever, unspecified: Secondary | ICD-10-CM | POA: Diagnosis not present

## 2017-09-05 DIAGNOSIS — E119 Type 2 diabetes mellitus without complications: Secondary | ICD-10-CM | POA: Insufficient documentation

## 2017-09-05 DIAGNOSIS — I2510 Atherosclerotic heart disease of native coronary artery without angina pectoris: Secondary | ICD-10-CM | POA: Insufficient documentation

## 2017-09-05 DIAGNOSIS — I252 Old myocardial infarction: Secondary | ICD-10-CM | POA: Diagnosis not present

## 2017-09-05 DIAGNOSIS — I110 Hypertensive heart disease with heart failure: Secondary | ICD-10-CM | POA: Insufficient documentation

## 2017-09-05 DIAGNOSIS — R531 Weakness: Secondary | ICD-10-CM | POA: Diagnosis not present

## 2017-09-05 DIAGNOSIS — I5042 Chronic combined systolic (congestive) and diastolic (congestive) heart failure: Secondary | ICD-10-CM | POA: Insufficient documentation

## 2017-09-05 DIAGNOSIS — Z950 Presence of cardiac pacemaker: Secondary | ICD-10-CM | POA: Insufficient documentation

## 2017-09-05 DIAGNOSIS — R42 Dizziness and giddiness: Secondary | ICD-10-CM | POA: Insufficient documentation

## 2017-09-05 DIAGNOSIS — R5383 Other fatigue: Secondary | ICD-10-CM | POA: Diagnosis present

## 2017-09-05 DIAGNOSIS — A09 Infectious gastroenteritis and colitis, unspecified: Secondary | ICD-10-CM | POA: Insufficient documentation

## 2017-09-05 LAB — URINALYSIS, COMPLETE (UACMP) WITH MICROSCOPIC
Bilirubin Urine: NEGATIVE
Glucose, UA: NEGATIVE mg/dL
Hgb urine dipstick: NEGATIVE
Ketones, ur: NEGATIVE mg/dL
Leukocytes, UA: NEGATIVE
Nitrite: NEGATIVE
Protein, ur: NEGATIVE mg/dL
Specific Gravity, Urine: 1.025 (ref 1.005–1.030)
pH: 5 (ref 5.0–8.0)

## 2017-09-05 LAB — CBC WITH DIFFERENTIAL/PLATELET
Basophils Absolute: 0 10*3/uL (ref 0–0.1)
Basophils Relative: 1 %
Eosinophils Absolute: 0.1 10*3/uL (ref 0–0.7)
Eosinophils Relative: 2 %
HCT: 29.7 % — ABNORMAL LOW (ref 35.0–47.0)
Hemoglobin: 9.9 g/dL — ABNORMAL LOW (ref 12.0–16.0)
Lymphocytes Relative: 16 %
Lymphs Abs: 1.1 10*3/uL (ref 1.0–3.6)
MCH: 29.1 pg (ref 26.0–34.0)
MCHC: 33.3 g/dL (ref 32.0–36.0)
MCV: 87.6 fL (ref 80.0–100.0)
Monocytes Absolute: 1 10*3/uL — ABNORMAL HIGH (ref 0.2–0.9)
Monocytes Relative: 16 %
Neutro Abs: 4.4 10*3/uL (ref 1.4–6.5)
Neutrophils Relative %: 65 %
Platelets: 249 10*3/uL (ref 150–440)
RBC: 3.39 MIL/uL — ABNORMAL LOW (ref 3.80–5.20)
RDW: 18.8 % — ABNORMAL HIGH (ref 11.5–14.5)
WBC: 6.6 10*3/uL (ref 3.6–11.0)

## 2017-09-05 LAB — GASTROINTESTINAL PANEL BY PCR, STOOL (REPLACES STOOL CULTURE)

## 2017-09-05 LAB — APTT: aPTT: <24 seconds — ABNORMAL LOW (ref 24–36)

## 2017-09-05 LAB — COMPREHENSIVE METABOLIC PANEL
ALT: 19 U/L (ref 14–54)
AST: 39 U/L (ref 15–41)
Albumin: 3.3 g/dL — ABNORMAL LOW (ref 3.5–5.0)
Alkaline Phosphatase: 79 U/L (ref 38–126)
Anion gap: 12 (ref 5–15)
BUN: 40 mg/dL — ABNORMAL HIGH (ref 6–20)
CO2: 21 mmol/L — ABNORMAL LOW (ref 22–32)
Calcium: 8.2 mg/dL — ABNORMAL LOW (ref 8.9–10.3)
Chloride: 103 mmol/L (ref 101–111)
Creatinine, Ser: 1.47 mg/dL — ABNORMAL HIGH (ref 0.44–1.00)
GFR calc Af Amer: 39 mL/min — ABNORMAL LOW (ref >60)
GFR calc non Af Amer: 34 mL/min — ABNORMAL LOW (ref >60)
Glucose, Bld: 117 mg/dL — ABNORMAL HIGH (ref 65–99)
Potassium: 3.8 mmol/L (ref 3.5–5.1)
Sodium: 136 mmol/L (ref 135–145)
Total Bilirubin: 0.2 mg/dL — ABNORMAL LOW (ref 0.3–1.2)
Total Protein: 7.1 g/dL (ref 6.5–8.1)

## 2017-09-05 LAB — INFLUENZA PANEL BY PCR (TYPE A & B)
Influenza A By PCR: NEGATIVE
Influenza B By PCR: NEGATIVE

## 2017-09-05 LAB — C DIFFICILE QUICK SCREEN W PCR REFLEX
C Diff antigen: NEGATIVE
C Diff interpretation: NOT DETECTED
C Diff toxin: NEGATIVE

## 2017-09-05 LAB — PROTIME-INR
INR: 1.02
Prothrombin Time: 13.3 seconds (ref 11.4–15.2)

## 2017-09-05 LAB — TROPONIN I: Troponin I: <0.03 ng/mL (ref <0.03)

## 2017-09-05 MED ORDER — SODIUM CHLORIDE 0.9 % IV BOLUS
250.0000 mL | Freq: Once | INTRAVENOUS | Status: AC
  Administered 2017-09-05: 250 mL via INTRAVENOUS

## 2017-09-05 NOTE — ED Triage Notes (Signed)
Arrives c/o 3 days of fatigue.  Last nigh patient was running a fever.  Also Diarrhea started yesterday.  Patient c/o dizziness as well, but dizziness has been an ongoing problem.

## 2017-09-05 NOTE — ED Notes (Signed)
IV insertion attempted x 2 by this RN. Unsuccessful at obtaining IV access

## 2017-09-05 NOTE — ED Provider Notes (Signed)
[facility] Emergency Department Provider Note       Time seen: ----------------------------------------- 9:24 AM on 09/05/2017 -----------------------------------------   I have reviewed the triage vital signs and the nursing notes.  HISTORY   Chief Complaint No chief complaint on file.    HPI Heather Peterson is a 77 y.o. female with a history of CHF, diabetes, hypertension, MI and pacemaker who presents to the ED for 3 days of fatigue.  Last night she was running a fever, also diarrhea started yesterday.  She does complain of dizziness but this is an ongoing problem.  Recently she was placed on Remeron, she does take chronic Lasix for heart failure but this dose is unchanged.  Family reports normal intake.  Past Medical History:  Diagnosis Date  . Chronic combined systolic and diastolic CHF (congestive heart failure) ([place])   . Diabetes mellitus without complication ([place])   . Hypertension   . MI (myocardial infarction) ([place])    x 5  . Pacemaker     Patient Active Problem List   Diagnosis Date Noted  . Left main coronary artery disease 03/07/2017  . Drug-induced torsades de pointes 03/06/2017  . Ventricular tachycardia ([place]) 03/05/2017  . Defibrillator discharge 03/04/2017  . Chronic combined systolic and diastolic CHF (congestive heart failure) ([place]) 03/04/2017  . Syncope and collapse 02/04/2017  . CAD (coronary artery disease) 02/04/2017  . HTN (hypertension) 02/04/2017  . Diabetes ([place]) 02/04/2017    Past Surgical History:  Procedure Laterality Date  . CENTRAL LINE INSERTION  03/11/2017   Procedure: CENTRAL LINE INSERTION;  Surgeon: Leonie Man, MD;  Location: [place] [facility];  Service: Cardiovascular;;  . CORONARY STENT INTERVENTION W/IMPELLA N/A 03/11/2017   Procedure: Coronary Stent Intervention w/Impella;  Surgeon: Leonie Man, MD;  Location: [place] [facility];  Service: Cardiovascular;  Laterality: N/A;  .  INTRAVASCULAR PRESSURE WIRE/FFR STUDY N/A 03/11/2017   Procedure: INTRAVASCULAR PRESSURE WIRE/FFR STUDY;  Surgeon: Leonie Man, MD;  Location: [place] [facility];  Service: Cardiovascular;  Laterality: N/A;  . INTRAVASCULAR ULTRASOUND/IVUS N/A 03/11/2017   Procedure: Intravascular Ultrasound/IVUS;  Surgeon: Leonie Man, MD;  Location: [place] [facility];  Service: Cardiovascular;  Laterality: N/A;  . LEFT HEART CATH AND CORONARY ANGIOGRAPHY N/A 03/05/2017   Procedure: LEFT HEART CATH AND CORONARY ANGIOGRAPHY;  Surgeon: Isaias Cowman, MD;  Location: [place] [facility];  Service: Cardiovascular;  Laterality: N/A;  . pacemaker/defibrillator Left     Allergies Celebrex [celecoxib]; Glipizide; Sulfa antibiotics; Levaquin [levofloxacin in d5w]; Levofloxacin; Metformin; and Penicillins  Social History Social History   Tobacco Use  . Smoking status: Never Smoker  . Smokeless tobacco: Never Used  Substance Use Topics  . Alcohol use: Yes    Comment: occasionally  . Drug use: No   Review of Systems Constitutional: Positive for fever Cardiovascular: Negative for chest pain. Respiratory: Negative for shortness of breath. Gastrointestinal: Negative for abdominal pain, positive for diarrhea Musculoskeletal: Negative for back pain. Skin: Negative for rash. Neurological: Negative for headaches, positive for generalized weakness  All systems negative/normal/unremarkable except as stated in the HPI  ____________________________________________   PHYSICAL EXAM:  VITAL SIGNS: ED Triage Vitals  Enc Vitals Group     BP 09/05/17 0919 101/63     Pulse Rate 09/05/17 0919 76     Resp 09/05/17 0918 20     Temp 09/05/17 0918 98 F (36.7 C)     Temp Source 09/05/17 0918 Oral  SpO2 09/05/17 0919 96 %     Weight 09/05/17 0918 180 lb (81.6 kg)     Height 09/05/17 0918 5\' 4"  (1.626 m)     Head Circumference --      Peak Flow --      Pain Score 09/05/17 0918 0     Pain Loc --       Pain Edu? --      Excl. in [place]? --    Constitutional: Alert and oriented. Well appearing and in no distress. Eyes: Conjunctivae are normal. Normal extraocular movements. ENT   Head: Normocephalic and atraumatic.   Nose: No congestion/rhinnorhea.   Mouth/Throat: Mucous membranes are moist.   Neck: No stridor. Cardiovascular: Normal rate, regular rhythm. No murmurs, rubs, or gallops. Respiratory: Normal respiratory effort without tachypnea nor retractions. Breath sounds are clear and equal bilaterally. No wheezes/rales/rhonchi. Gastrointestinal: Soft and nontender. Normal bowel sounds Musculoskeletal: Nontender with normal range of motion in extremities. No lower extremity tenderness nor edema. Neurologic:  Normal speech and language. No gross focal neurologic deficits are appreciated.  Generalized weakness, nothing focal Skin:  Skin is warm, dry and intact.  Petechial lesions are appreciated on the lower extremities Psychiatric: Mood and affect are normal. Speech and behavior are normal.  ____________________________________________  EKG: Interpreted by me.  Atrial paced rhythm with a rate of 70 bpm, normal pacemaker function, long QT  ____________________________________________  ED COURSE:  As part of my medical decision making, I reviewed the following data within the [place] History obtained from family if available, nursing notes, old chart and ekg, as well as notes from prior ED visits. Patient presented for weakness, fever and diarrhea, we will assess with labs and imaging as indicated at this time.   Procedures ____________________________________________   LABS (pertinent positives/negatives)  Labs Reviewed  GASTROINTESTINAL PANEL BY PCR, STOOL (REPLACES STOOL CULTURE) - Abnormal; Notable for the following components:      Result Value   Astrovirus DETECTED (*)    All other components within normal limits  CBC WITH  DIFFERENTIAL/PLATELET - Abnormal; Notable for the following components:   RBC 3.39 (*)    Hemoglobin 9.9 (*)    HCT 29.7 (*)    RDW 18.8 (*)    Monocytes Absolute 1.0 (*)    All other components within normal limits  COMPREHENSIVE METABOLIC PANEL - Abnormal; Notable for the following components:   CO2 21 (*)    Glucose, Bld 117 (*)    BUN 40 (*)    Creatinine, Ser 1.47 (*)    Calcium 8.2 (*)    Albumin 3.3 (*)    Total Bilirubin 0.2 (*)    GFR calc non Af Amer 34 (*)    GFR calc Af Amer 39 (*)    All other components within normal limits  URINALYSIS, COMPLETE (UACMP) WITH MICROSCOPIC - Abnormal; Notable for the following components:   Bacteria, UA RARE (*)    All other components within normal limits  APTT - Abnormal; Notable for the following components:   aPTT <24 (*)    All other components within normal limits  C DIFFICILE QUICK SCREEN W PCR REFLEX  TROPONIN I  PROTIME-INR  INFLUENZA PANEL BY PCR (TYPE A & B)  CBG MONITORING, ED    ____________________________________________  DIFFERENTIAL DIAGNOSIS   Dehydration, electrolyte abnormality, colitis, sepsis  FINAL ASSESSMENT AND PLAN  Weakness, diarrhea   Plan: The patient had presented for fever, weakness and diarrhea. Patient's labs did indicate an  astrovirus infection, otherwise testing was unremarkable.  She does feel well enough and desires to go home.  Have encouraged increasing her fluid intake, she is stable for outpatient follow-up.   Laurence Aly, MD   Note: This note was generated in part or whole with voice recognition software. Voice recognition is usually quite accurate but there are transcription errors that can and very often do occur. I apologize for any typographical errors that were not detected and corrected.     Earleen Newport, MD 09/05/17 1430

## 2017-09-05 NOTE — ED Notes (Signed)
Pt verbalized understanding of discharge instructions. NAD at this time. 

## 2017-09-05 NOTE — ED Notes (Signed)
Blank Note:  Patient on home 02, switched to our tank at 2L.  Alert and oriented, color pale.

## 2017-09-29 ENCOUNTER — Telehealth: Payer: Self-pay | Admitting: Cardiology

## 2017-09-29 ENCOUNTER — Encounter: Payer: Medicare Other | Admitting: *Deleted

## 2017-09-29 NOTE — Telephone Encounter (Signed)
LMOVM reminding pt to send remote transmission.   

## 2017-10-02 ENCOUNTER — Encounter: Payer: Self-pay | Admitting: Cardiology

## 2017-10-28 ENCOUNTER — Encounter: Payer: Self-pay | Admitting: Cardiology

## 2017-11-08 ENCOUNTER — Other Ambulatory Visit: Payer: Self-pay | Admitting: Internal Medicine

## 2017-12-05 ENCOUNTER — Telehealth: Payer: Self-pay | Admitting: Cardiology

## 2017-12-05 NOTE — Telephone Encounter (Signed)
Patient called and stated that she is having her device checked at [facility].

## 2018-01-02 ENCOUNTER — Ambulatory Visit: Payer: Self-pay | Admitting: Psychiatry

## 2018-01-07 ENCOUNTER — Ambulatory Visit (INDEPENDENT_AMBULATORY_CARE_PROVIDER_SITE_OTHER): Payer: Medicare Other | Admitting: Psychiatry

## 2018-01-07 ENCOUNTER — Other Ambulatory Visit: Payer: Self-pay | Admitting: Psychiatry

## 2018-01-07 ENCOUNTER — Encounter: Payer: Self-pay | Admitting: Psychiatry

## 2018-01-07 ENCOUNTER — Other Ambulatory Visit: Payer: Self-pay

## 2018-01-07 VITALS — BP 105/65 | HR 91 | Temp 97.6°F | Wt 189.2 lb

## 2018-01-07 DIAGNOSIS — F330 Major depressive disorder, recurrent, mild: Secondary | ICD-10-CM

## 2018-01-07 DIAGNOSIS — F17200 Nicotine dependence, unspecified, uncomplicated: Secondary | ICD-10-CM

## 2018-01-07 DIAGNOSIS — I255 Ischemic cardiomyopathy: Secondary | ICD-10-CM

## 2018-01-07 MED ORDER — LAMOTRIGINE 25 MG PO TABS: 25.0000 mg | ORAL_TABLET | Freq: Every day | ORAL | 1 refills | Status: DC

## 2018-01-07 NOTE — Patient Instructions (Signed)
Lamotrigine tablets What is this medicine? LAMOTRIGINE (la MOE Hendricks Limes) is used to control seizures in adults and children with epilepsy and Lennox-Gastaut syndrome. It is also used in adults to treat bipolar disorder. This medicine may be used for other purposes; ask your health care provider or pharmacist if you have questions. COMMON BRAND NAME(S): Lamictal What should I tell my health care provider before I take this medicine? They need to know if you have any of these conditions: -a history of depression or bipolar disorder -aseptic meningitis during prior use of lamotrigine -folate deficiency -kidney disease -liver disease -suicidal thoughts, plans, or attempt; a previous suicide attempt by you or a family member -an unusual or allergic reaction to lamotrigine or other seizure medications, other medicines, foods, dyes, or preservatives -pregnant or trying to get pregnant -breast-feeding How should I use this medicine? Take this medicine by mouth with a glass of water. Follow the directions on the prescription label. Do not chew these tablets. If this medicine upsets your stomach, take it with food or milk. Take your doses at regular intervals. Do not take your medicine more often than directed. A special MedGuide will be given to you by the pharmacist with each new prescription and refill. Be sure to read this information carefully each time. Talk to your pediatrician regarding the use of this medicine in children. While this drug may be prescribed for children as young as 2 years for selected conditions, precautions do apply. Overdosage: If you think you have taken too much of this medicine contact a poison control center or emergency room at once. NOTE: This medicine is only for you. Do not share this medicine with others. What if I miss a dose? If you miss a dose, take it as soon as you can. If it is almost time for your next dose, take only that dose. Do not take double or extra  doses. What may interact with this medicine? -carbamazepine -female hormones, including contraceptive or birth control pills -methotrexate -phenobarbital -phenytoin -primidone -pyrimethamine -rifampin -trimethoprim -valproic acid This list may not describe all possible interactions. Give your health care provider a list of all the medicines, herbs, non-prescription drugs, or dietary supplements you use. Also tell them if you smoke, drink alcohol, or use illegal drugs. Some items may interact with your medicine. What should I watch for while using this medicine? Visit your doctor or health care professional for regular checks on your progress. If you take this medicine for seizures, wear a Medic Alert bracelet or necklace. Carry an identification card with information about your condition, medicines, and doctor or health care professional. It is important to take this medicine exactly as directed. When first starting treatment, your dose will need to be adjusted slowly. It may take weeks or months before your dose is stable. You should contact your doctor or health care professional if your seizures get worse or if you have any new types of seizures. Do not stop taking this medicine unless instructed by your doctor or health care professional. Stopping your medicine suddenly can increase your seizures or their severity. Contact your doctor or health care professional right away if you develop a rash while taking this medicine. Rashes may be very severe and sometimes require treatment in the hospital. Deaths from rashes have occurred. Serious rashes occur more often in children than adults taking this medicine. It is more common for these serious rashes to occur during the first 2 months of treatment, but a rash can  occur at any time. You may get drowsy, dizzy, or have blurred vision. Do not drive, use machinery, or do anything that needs mental alertness until you know how this medicine affects you.  To reduce dizzy or fainting spells, do not sit or stand up quickly, especially if you are an older patient. Alcohol can increase drowsiness and dizziness. Avoid alcoholic drinks. If you are taking this medicine for bipolar disorder, it is important to report any changes in your mood to your doctor or health care professional. If your condition gets worse, you get mentally depressed, feel very hyperactive or manic, have difficulty sleeping, or have thoughts of hurting yourself or committing suicide, you need to get help from your health care professional right away. If you are a caregiver for someone taking this medicine for bipolar disorder, you should also report these behavioral changes right away. The use of this medicine may increase the chance of suicidal thoughts or actions. Pay special attention to how you are responding while on this medicine. Your mouth may get dry. Chewing sugarless gum or sucking hard candy, and drinking plenty of water may help. Contact your doctor if the problem does not go away or is severe. Women who become pregnant while using this medicine may enroll in the [place] Pregnancy Registry by calling [phone]. This registry collects information about the safety of antiepileptic drug use during pregnancy. What side effects may I notice from receiving this medicine? Side effects that you should report to your doctor or health care professional as soon as possible: -allergic reactions like skin rash, itching or hives, swelling of the face, lips, or tongue -blurred or double vision -difficulty walking or controlling muscle movements -fever -headache, stiff neck, and sensitivity to light -painful sores in the mouth, eyes, or nose -redness, blistering, peeling or loosening of the skin, including inside the mouth -severe muscle pain -swollen lymph glands -uncontrollable eye movements -unusual bruising or bleeding -unusually weak or  tired -vomiting -worsening of mood, thoughts or actions of suicide or dying -yellowing of the eyes or skin Side effects that usually do not require medical attention (report to your doctor or health care professional if they continue or are bothersome): -diarrhea or constipation -difficulty sleeping -nausea -tremors This list may not describe all possible side effects. Call your doctor for medical advice about side effects. You may report side effects to FDA at 1-800-FDA-1088. Where should I keep my medicine? Keep out of reach of children. Store at room temperature between 15 and 30 degrees C (59 and 86 degrees F). Throw away any unused medicine after the expiration date. NOTE: This sheet is a summary. It may not cover all possible information. If you have questions about this medicine, talk to your doctor, pharmacist, or health care provider.  2018 Elsevier/Gold Standard (2015-06-01 09:29:40)   Lamotrigine can cause serious rash requiring hospitalization and discontinuation of treatment - 0.08% to 0.3 % in adults.

## 2018-01-07 NOTE — Progress Notes (Signed)
Psychiatric Initial Adult Assessment   Patient Identification: Heather Peterson MRN:  [id number] Date of Evaluation:  01/07/2018 Referral Source: Dr. Thereasa Distance Chief Complaint:  ' I am here to establish care." Chief Complaint    Establish Care; Anxiety; Depression     Visit Diagnosis:    ICD-10-CM   1. MDD (major depressive disorder), recurrent episode, mild (HCC) F33.0 lamoTRIgine (LAMICTAL) 25 MG tablet  2. Tobacco use disorder F17.200     History of Present Illness:  Heather Peterson is a 77 year old Caucasian female, married, retired, lives in [place] , is a history of a heart failure, hx of MI s/p stent placement ,history of ventricular fibrillation history of prolonged QT, ICD placement, ventricular tachycardia, drug-induced torsades de pointes, mitral regurgitation, hypertension, diabetes mellitus, hypothyroidism, presented to the clinic today to establish care.  Patient reports she has been struggling with depression all her adult life.    Patient reports her depressive symptoms as worsening since the past several months.  She describes her depressive symptoms as anhedonia, fatigue, sadness, negative self-image and so on.  She reports sleep as fair.  She denies any suicidality.  She denies any perceptual disturbances.  She reports she used to take Prozac in the past however that had an effect on her cardiac health and affected her defibrillator.  Patient reports she hence was taken off of the Prozac.  She was recently started on mirtazapine by her primary medical doctor.  She reports she has been on it since over a month.  She reports her provider will keep her only on a very small dose of 7.5 mg and did not want to increase the dosage due to her cardiac problems.  Patient does not think the mirtazapine is effective at this dosage.  Patient also reports some anxiety symptoms and calls herself a [employer], trade [employer].  She reports there are times when she is anxious and has restlessness due to her  anxiety.  She however reports it does not happen all the time.  Patient denies any history of trauma.  Patient denies any history of manic or hypomanic symptoms.  Patient reports her major psychosocial stressor is her own health issues.  Patient has a portable oxygen device that she carries with her all the time.  Patient reports it is difficult for her to do the chores around the house because she feels tired all the time.  She reports she is able to disconnect the device for a while however if she walks around a little bit she starts getting tired and has to use it again.  Patient reports good social support from her husband and her daughter who lives with her.  Patient also reports she has friends who comes over and spends time with her.    Associated Signs/Symptoms: Depression Symptoms:  depressed mood, anhedonia, fatigue, (Hypo) Manic Symptoms:  denies Anxiety Symptoms:  anxiety sx on and off Psychotic Symptoms:  denies PTSD Symptoms: Negative  Past Psychiatric History: Patient reports a history of depression all her adult life.  Patient denies any inpatient mental health admissions.  Patient denies any history of suicide attempts.  Her medication were recently being prescribed by her primary medical doctor.  Previous Psychotropic Medications: Yes Prozac, mirtazapine  Substance Abuse History in the last 12 months:  No.  Consequences of Substance Abuse: Negative  Past Medical History:  Past Medical History:  Diagnosis Date  . Anxiety   . Chronic combined systolic and diastolic CHF (congestive heart failure) ([place])   . Coronary artery  disease   . Depression   . Diabetes mellitus without complication ([place])   . Diabetes mellitus, type II ([place])   . Hypertension   . MI (myocardial infarction) ([place])    x 5  . Pacemaker   . Thyroid disease     Past Surgical History:  Procedure Laterality Date  . CENTRAL LINE INSERTION  03/11/2017   Procedure: CENTRAL LINE INSERTION;   Surgeon: Leonie Man, MD;  Location: [facility];  Service: Cardiovascular;;  . CHOLECYSTECTOMY    . CORONARY STENT INTERVENTION W/IMPELLA N/A 03/11/2017   Procedure: Coronary Stent Intervention w/Impella;  Surgeon: Leonie Man, MD;  Location: [facility];  Service: Cardiovascular;  Laterality: N/A;  . EYE SURGERY    . INTRAVASCULAR PRESSURE WIRE/FFR STUDY N/A 03/11/2017   Procedure: INTRAVASCULAR PRESSURE WIRE/FFR STUDY;  Surgeon: Leonie Man, MD;  Location: [facility];  Service: Cardiovascular;  Laterality: N/A;  . INTRAVASCULAR ULTRASOUND/IVUS N/A 03/11/2017   Procedure: Intravascular Ultrasound/IVUS;  Surgeon: Leonie Man, MD;  Location: [facility];  Service: Cardiovascular;  Laterality: N/A;  . LEFT HEART CATH AND CORONARY ANGIOGRAPHY N/A 03/05/2017   Procedure: LEFT HEART CATH AND CORONARY ANGIOGRAPHY;  Surgeon: Isaias Cowman, MD;  Location: [facility];  Service: Cardiovascular;  Laterality: N/A;  . PACEMAKER IMPLANT    . pacemaker/defibrillator Left     Family Psychiatric History: Brother-mental illness, grandson-intellectual disability-lives in a group home.  Family History:  Family History  Problem Relation Age of Onset  . Hypertension Father   . Heart attack Father   . Depression Sister   . Depression Brother   . Depression Brother     Social History:   Social History   Socioeconomic History  . Marital status: Married    Spouse name: rodney  . Number of children: 2  . Years of education: Not on file  . Highest education level: High school graduate  Occupational History  . Not on file  Social Needs  . Financial resource strain: Not hard at all  . Food insecurity:    Worry: Never true    Inability: Never true  . Transportation needs:    Medical: Yes    Non-medical: Yes  Tobacco Use  . Smoking status: Current Every Day Smoker    Packs/day: 0.25    Types: E-cigarettes, Cigarettes  . Smokeless  tobacco: Never Used  Substance and Sexual Activity  . Alcohol use: Not Currently    Comment: occasionally  . Drug use: No  . Sexual activity: Not Currently  Lifestyle  . Physical activity:    Days per week: 0 days    Minutes per session: 0 min  . Stress: Only a little  Relationships  . Social connections:    Talks on phone: Three times a week    Gets together: More than three times a week    Attends religious service: Never    Active member of club or organization: No    Attends meetings of clubs or organizations: Never    Relationship status: Married  Other Topics Concern  . Not on file  Social History Narrative  . Not on file    Additional Social History: Pt is married.  She lives in [place].  She lives with her husband and her daughter.  Her daughter is the primary caretaker.  Patient has a son who is also supportive.  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  Allergies:   Allergies  Allergen Reactions  . Celebrex [Celecoxib] Anaphylaxis  . Glipizide Anaphylaxis  . Sulfa Antibiotics Other (See Comments) and Anaphylaxis    Reaction: unknown  . Levaquin [Levofloxacin In D5w] Other (See Comments)    Heart arrhthymias  . Levofloxacin Other (See Comments)    ICD fired  . Metformin Other (See Comments)    Gi tolerance   . Penicillins Rash and Other (See Comments)    Has patient had a PCN reaction causing immediate rash, facial/tongue/throat swelling, SOB or lightheadedness with hypotension: Unknown Has patient had a PCN reaction causing severe rash involving mucus membranes or skin necrosis: No Has patient had a PCN reaction that required hospitalization: No Has patient had a PCN reaction occurring within the last 10 years: No If all of the above answers are "NO", then may proceed with Cephalosporin use.     Metabolic Disorder Labs: No results found for: HGBA1C, MPG No results found for: PROLACTIN No results found for: CHOL, TRIG, HDL,  CHOLHDL, VLDL, LDLCALC   Current Medications: Current Outpatient Medications  Medication Sig Dispense Refill  . acetaminophen (TYLENOL) 500 MG tablet Take 500 mg by mouth every 4 (four) hours as needed for mild pain, fever or headache.     . albuterol (PROVENTIL HFA;VENTOLIN HFA) 108 (90 Base) MCG/ACT inhaler Inhale 2 puffs into the lungs every 4 (four) hours as needed for wheezing or shortness of breath. 1 Inhaler 1  . aspirin EC 81 MG tablet Take 81 mg by mouth daily.    . clopidogrel (PLAVIX) 75 MG tablet Take 1 tablet (75 mg total) by mouth daily. 30 tablet 1  . Fluticasone-Salmeterol (ADVAIR DISKUS) 250-50 MCG/DOSE AEPB Inhale 1 puff into the lungs 2 (two) times daily. 1 each 1  . furosemide (LASIX) 40 MG tablet Take 40 mg by mouth daily as needed for fluid or edema.    . Levothyroxine Sodium 100 MCG CAPS Take 100 mcg by mouth daily.   3  . metFORMIN (GLUCOPHAGE-XR) 750 MG 24 hr tablet Take 375 mg by mouth 2 (two) times daily.     . metoprolol succinate (TOPROL-XL) 25 MG 24 hr tablet TAKE 1/2 TABLET BY MOUTH DAILY WITH OR IMMEDIATELY FOLLOWING A MEAL 30 tablet 3  . mexiletine (MEXITIL) 200 MG capsule Take 1 capsule (200 mg total) by mouth every 12 (twelve) hours. 60 capsule 1  . Multiple Vitamin (MULTIVITAMIN WITH MINERALS) TABS tablet Take 1 tablet by mouth daily.    . nitroGLYCERIN (NITROSTAT) 0.4 MG SL tablet Place 0.4 mg under the tongue every 5 (five) minutes as needed for chest pain.    Marland Kitchen omega-3 acid ethyl esters (LOVAZA) 1 g capsule Take 2 g by mouth 2 (two) times daily.     . pioglitazone (ACTOS) 15 MG tablet Take 15 mg by mouth daily.    . potassium chloride SA (K-DUR,KLOR-CON) 20 MEQ tablet Take 1 tablet (20 mEq total) by mouth daily. (Patient taking differently: Take 20 mEq by mouth daily as needed (take along with lasix when needed). ) 30 tablet 1  . ranolazine (RANEXA) 500 MG 12 hr tablet Take 1 tablet (500 mg total) by mouth 2 (two) times daily. 60 tablet 1  . simvastatin  (ZOCOR) 40 MG tablet Take 20 mg by mouth at bedtime.   2  . lamoTRIgine (LAMICTAL) 25 MG tablet Take 1 tablet (25 mg total) by mouth daily. For mood symptoms 30 tablet 1   No current facility-administered medications for this visit.  Neurologic: Headache: No Seizure: No Paresthesias:No  Musculoskeletal: Strength & Muscle Tone: within normal limits Gait & Station: normal Patient leans: N/A  Psychiatric Specialty Exam: Review of Systems  Constitutional: Positive for malaise/fatigue.  Psychiatric/Behavioral: Positive for depression. The patient is nervous/anxious.   All other systems reviewed and are negative.   Blood pressure 105/65, pulse 91, temperature 97.6 F (36.4 C), temperature source Oral, weight 189 lb 3.2 oz (85.8 kg), SpO2 96 %.Body mass index is 32.48 kg/m.  General Appearance: Casual  Eye Contact:  Fair  Speech:  Clear and Coherent  Volume:  Normal  Mood:  Anxious and Depressed  Affect:  Appropriate  Thought Process:  Goal Directed and Descriptions of Associations: Intact  Orientation:  Full (Time, Place, and Person)  Thought Content:  Logical  Suicidal Thoughts:  No  Homicidal Thoughts:  No  Memory:  Immediate;   Fair Recent;   Fair Remote;   Fair  Judgement:  Fair  Insight:  Fair  Psychomotor Activity:  Normal  Concentration:  Concentration: Fair and Attention Span: Fair  Recall:  [employer] of Knowledge:Fair  Language: Fair  Akathisia:  No  Handed:  Right  AIMS (if indicated):na  Assets:  Communication Skills Desire for Improvement Housing Intimacy Social Support  ADL's:  Intact  Cognition: WNL  Sleep:  fair    Treatment Plan Summary:Heather Peterson is a 77 year old Caucasian female, married, retired, lives in [place], has a history of multiple medical problems including history of MI, status post stent placement, history of ICD placement, ischemic cardiomyopathy, history of drug induced torsades de pointes, history of prolonged QT, history of  ventricular fibrillation, hypothyroidism, hypertension, hyperlipidemia, portable oxygen, presented to the clinic today to establish care.  Patient struggles with depressive symptoms.  Patient is biologically predisposed given her multiple cardiac problems as well as other health issues and family history of mental health problems.  Patient also has psychosocial stressors of her own health issues.  Patient has good social support system and currently denies any suicidality.  Patient will benefit from medications as well as psychotherapy. Medication management and Plan as noted below Plan For MDD PHQ 9 today equals 9 Patient's depressive symptoms-which she describes as fatigue, anhedonia and low motivation are likely also due to her multiple medical problems her need to have portable oxygen all the time and so on.  Patient does have an extensive cardiac history including drug-induced torsades de pointes , prolonged QT , and hence discussed with her that antidepressants can have an impact on her cardiac health.  Discussed with her that mirtazapine can also have that risk.  Since patient reports mirtazapine is not helpful we will discontinue the same.  Discussed adding a mood stabilizer like lamotrigine.  I did discuss the risk of Stevens-Johnson syndrome on lamotrigine with patient.  Discussed with her she could start lamotrigine and CBT together or she could start with psychotherapy initially and see if that will help her with her depressive symptoms and then start a medication later on if she needs more help. Patient reports she will make a decision and Biochemist, clinical know. Will send Lamictal 25 mg p.o. daily to her pharmacy. Will refer her for CBT-once every week with therapist in clinic.  R/O GAD Patient does report on and off anxiety symptoms.  Her GAD 7 today is only 7.  We will continue to monitor her closely.  Tobacco use disorder Provided smoking cessation counseling.  Patient reports she is trying  to cut down.  Patient does have a history of hypothyroidism- her TSH is being routinely monitored and her medications are being readjusted by her primary medical doctor.  I have reviewed her medical [facility] in [facility].  Follow-up in clinic in 4 weeks or sooner if needed.  She will however start psychotherapy with therapist in clinic as soon as possible.  More than 50 % of the time was spent for psychoeducation and supportive psychotherapy and care coordination.  This note was generated in part or whole with voice recognition software. Voice recognition is usually quite accurate but there are transcription errors that can and very often do occur. I apologize for any typographical errors that were not detected and corrected.      Ursula Alert, MD 8/28/201911:11 AM

## 2018-01-13 ENCOUNTER — Ambulatory Visit (INDEPENDENT_AMBULATORY_CARE_PROVIDER_SITE_OTHER): Payer: Medicare Other | Admitting: Licensed Clinical Social Worker

## 2018-01-13 DIAGNOSIS — F330 Major depressive disorder, recurrent, mild: Secondary | ICD-10-CM | POA: Diagnosis not present

## 2018-01-13 NOTE — Progress Notes (Signed)
Comprehensive Clinical Assessment (CCA) Note  01/13/2018 Heather Peterson [id number]  Visit Diagnosis:      ICD-10-CM   1. MDD (major depressive disorder), recurrent episode, mild (HCC) F33.0       CCA Part One  Part One has been completed on paper by the patient.  (See scanned document in Chart Review)  CCA Part Two A  Intake/Chief Complaint:  CCA Intake With Chief Complaint CCA Part Two Date: 01/13/18 CCA Part Two Time: 24 Chief Complaint/Presenting Problem: "I think it's depression. That's what my doctor thought. I have a heart problem and he didn't want to prescribe any meds without seeing a psychiatrist."  Patients Currently Reported Symptoms/Problems: "I sleep quite a bit, decreased energy,"  Collateral Involvement: N/A Individual's Strengths: "I don't do much of anything lately. I can't drive--or at least my kids don't want me to drive."  Individual's Preferences: therapy  Individual's Abilities: good communication, limited insight  Type of Services Patient Feels Are Needed: outpatient therapy, medication management.  Initial Clinical Notes/Concerns: Conflict with children, increased depression.   Mental Health Symptoms Depression:  Depression: Change in energy/activity, Fatigue, Irritability, Sleep (too much or little), Weight gain/loss  Mania:  Mania: N/A  Anxiety:   Anxiety: Irritability  Psychosis:  Psychosis: N/A  Trauma:  Trauma: N/A  Obsessions:  Obsessions: N/A  Compulsions:  Compulsions: N/A  Inattention:  Inattention: N/A  Hyperactivity/Impulsivity:  Hyperactivity/Impulsivity: N/A  Oppositional/Defiant Behaviors:  Oppositional/Defiant Behaviors: N/A  Borderline Personality:  Emotional Irregularity: Mood lability  Other Mood/Personality Symptoms:  Other Mood/Personality Symtpoms: None reported   Mental Status Exam Appearance and self-care  Stature:  Stature: Average  Weight:  Weight: Overweight  Clothing:  Clothing: Neat/clean  Grooming:  Grooming:  Well-groomed  Cosmetic use:  Cosmetic Use: Inappropriate for age  Posture/gait:  Posture/Gait: Normal  Motor activity:  Motor Activity: Not Remarkable  Sensorium  Attention:  Attention: Normal  Concentration:  Concentration: Normal  Orientation:  Orientation: X5  Recall/memory:  Recall/Memory: Normal  Affect and Mood  Affect:  Affect: Depressed  Mood:  Mood: Depressed  Relating  Eye contact:  Eye Contact: Normal  Facial expression:  Facial Expression: Depressed  Attitude toward examiner:  Attitude Toward Examiner: Cooperative  Thought and Language  Speech flow: Speech Flow: Normal  Thought content:  Thought Content: Appropriate to mood and circumstances  Preoccupation:  Preoccupations: (N/A)  Hallucinations:  Hallucinations: (N/A)  Organization:     Transport planner of Knowledge:  Fund of Knowledge: Average  Intelligence:  Intelligence: Average  Abstraction:  Abstraction: Normal  Judgement:  Judgement: Normal  Reality Testing:  Reality Testing: Realistic  Insight:  Insight: Good  Decision Making:  Decision Making: Normal  Social Functioning  Social Maturity:  Social Maturity: Responsible  Social Judgement:  Social Judgement: Normal  Stress  Stressors:  Stressors: Family conflict, Transitions  Coping Ability:  Coping Ability: Research officer, political party Deficits:     Supports:      Family and Psychosocial History: Family history Marital status: Married Number of Years Married: 20 What types of issues is patient dealing with in the relationship?: "No, but when you're with someone 24/7, you get irritated with them."  Additional relationship information: None reported Are you sexually active?: No What is your sexual orientation?: heterosexual  Has your sexual activity been affected by drugs, alcohol, medication, or emotional stress?: Medical  Does patient have children?: Yes How many children?: 2 How is patient's relationship with their children?: "My son lives here, we  have  a good relationship. My daughter lives with [place], and she fixes dinner."   Childhood History:  Childhood History By whom was/is the patient raised?: Both parents Additional childhood history information: "My dad died when he was 16. It was just mainly my mother."  Description of patient's relationship with caregiver when they were a child: "I had a very good childhood."  Patient's description of current relationship with people who raised him/her: "My father died when I was young. My mother died in 08-22-1998."  How were you disciplined when you got in trouble as a child/adolescent?: "My dad would give [place] whippings. My mother would just holler at [place]."  Does patient have siblings?: Yes Number of Siblings: 3 Description of patient's current relationship with siblings: Two brothers have passed away, and one sister. "We talk on the phone every day."  Did patient suffer any verbal/emotional/physical/sexual abuse as a child?: No Did patient suffer from severe childhood neglect?: No Has patient ever been sexually abused/assaulted/raped as an adolescent or adult?: No Was the patient ever a victim of a crime or a disaster?: No Witnessed domestic violence?: No Has patient been effected by domestic violence as an adult?: No  CCA Part Two B  Employment/Work Situation: Employment / Work Copywriter, advertising Employment situation: Retired Archivist job has been impacted by current illness: No What is the longest time patient has a held a job?: 7 years Where was the patient employed at that time?: [employer]  Did You Receive Any Psychiatric Treatment/Services While in the [employer]?: (N/A) Are There Guns or Other Weapons in [place]?: Yes Types of Guns/Weapons: "My daughter has a gun." Are These Psychologist, educational?: Yes  Education: Museum/gallery curator Currently Attending: N/A Last Grade Completed: 12 Name of [place]: Engineer, civil (consulting)  Did Teacher, adult education From [employer]?:  Yes Did Physicist, medical?: No Did You Attend Graduate School?: No Did You Have Any Special Interests In School?: N/A Did You Have An Individualized Education Program (IIEP): No Did You Have Any Difficulty At School?: No  Religion: Religion/Spirituality Are You A Religious Person?: Yes What is Your Religious Affiliation?: [place] How Might This Affect Treatment?: N/A  Leisure/Recreation: Leisure / Recreation Leisure and Hobbies: "I can't do anything. I don't ever feel like doing anything."   Exercise/Diet: Exercise/Diet Do You Exercise?: No Have You Gained or Lost A Significant Amount of Weight in the Past Six Months?: Yes-Gained Number of Pounds Gained: 20 Do You Follow a Special Diet?: No Do You Have Any Trouble Sleeping?: Yes Explanation of Sleeping Difficulties: "I sleep too much."   CCA Part Two C  Alcohol/Drug Use: Alcohol / Drug Use Pain Medications: N/A Prescriptions: Heart medications, lamotrigine Over the Counter: vitamins, colon health  History of alcohol / drug use?: No history of alcohol / drug abuse                      CCA Part Three  ASAM's:  Six Dimensions of Multidimensional Assessment  Dimension 1:  Acute Intoxication and/or Withdrawal Potential:     Dimension 2:  Biomedical Conditions and Complications:     Dimension 3:  Emotional, Behavioral, or Cognitive Conditions and Complications:     Dimension 4:  Readiness to Change:     Dimension 5:  Relapse, Continued use, or Continued Problem Potential:     Dimension 6:  Recovery/Living Environment:      Substance use Disorder (SUD)    Social Function:  Social  Functioning Social Maturity: Responsible Social Judgement: Normal  Stress:  Stress Stressors: Family conflict, Transitions Coping Ability: Exhausted Patient Takes Medications The Way The Doctor Instructed?: Yes Priority Risk: Low Acuity  Risk Assessment- Self-Harm Potential: Risk Assessment For Self-Harm  Potential Thoughts of Self-Harm: No current thoughts Method: No plan Availability of Means: No access/NA Additional Comments for Self-Harm Potential: n/A  Risk Assessment -Dangerous to Others Potential: Risk Assessment For Dangerous to Others Potential Method: No Plan Availability of Means: No access or NA Intent: Vague intent or NA Notification Required: No need or identified person Additional Comments for Danger to Others Potential: None reported  DSM5 Diagnoses: Patient Active Problem List   Diagnosis Date Noted  . Left main coronary artery disease 03/07/2017  . Drug-induced torsades de pointes 03/06/2017  . Ventricular tachycardia ([place]) 03/05/2017  . Defibrillator discharge 03/04/2017  . Chronic combined systolic and diastolic CHF (congestive heart failure) ([place]) 03/04/2017  . COPD (chronic obstructive pulmonary disease) ([place]) 02/10/2017  . Diabetes mellitus type 2, uncomplicated ([place]) [id number]  . H/O ventricular fibrillation 02/10/2017  . Hyperlipidemia 02/10/2017  . 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 Ischemic cardiomyopathy 02/10/2017  . Myocardial infarction ([place]) 02/10/2017  . Moderate mitral regurgitation 02/10/2017  . Syncope and collapse 02/04/2017  . CAD (coronary artery disease) 02/04/2017  . HTN (hypertension) 02/04/2017  . Diabetes ([place]) 02/04/2017    Patient Centered Plan: Patient is on the following Treatment Plan(s):  Depression  Recommendations for Services/Supports/Treatments: Recommendations for Services/Supports/Treatments Recommendations For Services/Supports/Treatments: Individual Therapy, Medication Management  Treatment Plan Summary: Pt will continue to identify signs of depression and practice coping mechanisms to improve her ability to manage depression.     Referrals to Alternative Service(s): Referred to Alternative Service(s):   Place:   Date:   Time:    Referred to Alternative Service(s):   Place:    Date:   Time:    Referred to Alternative Service(s):   Place:   Date:   Time:    Referred to Alternative Service(s):   Place:   Date:   Time:     Alden Hipp, LCSW

## 2018-02-05 ENCOUNTER — Ambulatory Visit (INDEPENDENT_AMBULATORY_CARE_PROVIDER_SITE_OTHER): Payer: Medicare Other | Admitting: Psychiatry

## 2018-02-05 ENCOUNTER — Other Ambulatory Visit: Payer: Self-pay

## 2018-02-05 ENCOUNTER — Encounter: Payer: Self-pay | Admitting: Psychiatry

## 2018-02-05 VITALS — BP 103/68 | HR 89 | Temp 97.5°F | Wt 190.4 lb

## 2018-02-05 DIAGNOSIS — F17200 Nicotine dependence, unspecified, uncomplicated: Secondary | ICD-10-CM

## 2018-02-05 DIAGNOSIS — F330 Major depressive disorder, recurrent, mild: Secondary | ICD-10-CM | POA: Diagnosis not present

## 2018-02-05 DIAGNOSIS — I255 Ischemic cardiomyopathy: Secondary | ICD-10-CM

## 2018-02-05 MED ORDER — LAMOTRIGINE 25 MG PO TABS: 50.0000 mg | ORAL_TABLET | Freq: Every day | ORAL | 0 refills | Status: DC

## 2018-02-05 NOTE — Patient Instructions (Signed)
Lamotrigine tablets What is this medicine? LAMOTRIGINE (la MOE Hendricks Limes) is used to control seizures in adults and children with epilepsy and Lennox-Gastaut syndrome. It is also used in adults to treat bipolar disorder. This medicine may be used for other purposes; ask your health care provider or pharmacist if you have questions. COMMON BRAND NAME(S): Lamictal What should I tell my health care provider before I take this medicine? They need to know if you have any of these conditions: -a history of depression or bipolar disorder -aseptic meningitis during prior use of lamotrigine -folate deficiency -kidney disease -liver disease -suicidal thoughts, plans, or attempt; a previous suicide attempt by you or a family member -an unusual or allergic reaction to lamotrigine or other seizure medications, other medicines, foods, dyes, or preservatives -pregnant or trying to get pregnant -breast-feeding How should I use this medicine? Take this medicine by mouth with a glass of water. Follow the directions on the prescription label. Do not chew these tablets. If this medicine upsets your stomach, take it with food or milk. Take your doses at regular intervals. Do not take your medicine more often than directed. A special MedGuide will be given to you by the pharmacist with each new prescription and refill. Be sure to read this information carefully each time. Talk to your pediatrician regarding the use of this medicine in children. While this drug may be prescribed for children as young as 2 years for selected conditions, precautions do apply. Overdosage: If you think you have taken too much of this medicine contact a poison control center or emergency room at once. NOTE: This medicine is only for you. Do not share this medicine with others. What if I miss a dose? If you miss a dose, take it as soon as you can. If it is almost time for your next dose, take only that dose. Do not take double or extra  doses. What may interact with this medicine? -carbamazepine -female hormones, including contraceptive or birth control pills -methotrexate -phenobarbital -phenytoin -primidone -pyrimethamine -rifampin -trimethoprim -valproic acid This list may not describe all possible interactions. Give your health care provider a list of all the medicines, herbs, non-prescription drugs, or dietary supplements you use. Also tell them if you smoke, drink alcohol, or use illegal drugs. Some items may interact with your medicine. What should I watch for while using this medicine? Visit your doctor or health care professional for regular checks on your progress. If you take this medicine for seizures, wear a Medic Alert bracelet or necklace. Carry an identification card with information about your condition, medicines, and doctor or health care professional. It is important to take this medicine exactly as directed. When first starting treatment, your dose will need to be adjusted slowly. It may take weeks or months before your dose is stable. You should contact your doctor or health care professional if your seizures get worse or if you have any new types of seizures. Do not stop taking this medicine unless instructed by your doctor or health care professional. Stopping your medicine suddenly can increase your seizures or their severity. Contact your doctor or health care professional right away if you develop a rash while taking this medicine. Rashes may be very severe and sometimes require treatment in the hospital. Deaths from rashes have occurred. Serious rashes occur more often in children than adults taking this medicine. It is more common for these serious rashes to occur during the first 2 months of treatment, but a rash can  occur at any time. You may get drowsy, dizzy, or have blurred vision. Do not drive, use machinery, or do anything that needs mental alertness until you know how this medicine affects you.  To reduce dizzy or fainting spells, do not sit or stand up quickly, especially if you are an older patient. Alcohol can increase drowsiness and dizziness. Avoid alcoholic drinks. If you are taking this medicine for bipolar disorder, it is important to report any changes in your mood to your doctor or health care professional. If your condition gets worse, you get mentally depressed, feel very hyperactive or manic, have difficulty sleeping, or have thoughts of hurting yourself or committing suicide, you need to get help from your health care professional right away. If you are a caregiver for someone taking this medicine for bipolar disorder, you should also report these behavioral changes right away. The use of this medicine may increase the chance of suicidal thoughts or actions. Pay special attention to how you are responding while on this medicine. Your mouth may get dry. Chewing sugarless gum or sucking hard candy, and drinking plenty of water may help. Contact your doctor if the problem does not go away or is severe. Women who become pregnant while using this medicine may enroll in the [place] Pregnancy Registry by calling [phone]. This registry collects information about the safety of antiepileptic drug use during pregnancy. What side effects may I notice from receiving this medicine? Side effects that you should report to your doctor or health care professional as soon as possible: -allergic reactions like skin rash, itching or hives, swelling of the face, lips, or tongue -blurred or double vision -difficulty walking or controlling muscle movements -fever -headache, stiff neck, and sensitivity to light -painful sores in the mouth, eyes, or nose -redness, blistering, peeling or loosening of the skin, including inside the mouth -severe muscle pain -swollen lymph glands -uncontrollable eye movements -unusual bruising or bleeding -unusually weak or  tired -vomiting -worsening of mood, thoughts or actions of suicide or dying -yellowing of the eyes or skin Side effects that usually do not require medical attention (report to your doctor or health care professional if they continue or are bothersome): -diarrhea or constipation -difficulty sleeping -nausea -tremors This list may not describe all possible side effects. Call your doctor for medical advice about side effects. You may report side effects to FDA at 1-800-FDA-1088. Where should I keep my medicine? Keep out of reach of children. Store at room temperature between 15 and 30 degrees C (59 and 86 degrees F). Throw away any unused medicine after the expiration date. NOTE: This sheet is a summary. It may not cover all possible information. If you have questions about this medicine, talk to your doctor, pharmacist, or health care provider.  2018 Elsevier/Gold Standard (2015-06-01 09:29:40)

## 2018-02-05 NOTE — Progress Notes (Signed)
[facility] OP Progress Note  02/05/2018 11:27 AM Heather Peterson  MRN:  [id number]  Chief Complaint: ' I am here for follow up." Chief Complaint    Follow-up     HPI: Heather Peterson is a 77 year old Caucasian female, married, retired, lives in [place], has a history of heart failure, history of MI, status post stent placement, history of ventricular fibrillation, history of prolonged QT, ICD placement, V. tach, drug-induced torsades de pointes , atrial regurgitation, hypertension, diabetes mellitus, hypothyroidism, on portable oxygen, presented to the clinic today for a follow-up visit.   Patient reports her mood as improving since the past one week or so.  She reports she does not know if that has anything to do with the medication or not.  She reports her friend is here to visit and they have been doing a lot of things together and that may also have also contributed to her improved mood the last week.  She however continues to struggle with some fatigue and tiredness during the day.  Discussed with patient that it may be due to her multiple medical problems as well as being on portable oxygen and polypharmacy which could be contributing to her tiredness.  Discussed with patient that antidepressants can have an impact on her heart and hence she has to make use of her therapy sessions as much as possible.  She is currently on Lamictal which she is tolerating well.  She denies any side effects.  Discussed increasing her Lamictal and she agrees with plan.  Patient reports sleep is good.  Patient denies any suicidality or homicidality.  Patient denies any significant anxiety symptoms.  Patient continues to smoke cigarettes and reports she is not ready to quit. Visit Diagnosis:    ICD-10-CM   1. MDD (major depressive disorder), recurrent episode, mild (HCC) F33.0 lamoTRIgine (LAMICTAL) 25 MG tablet  2. Tobacco use disorder F17.200     Past Psychiatric History: Reviewed past psychiatric history from my  progress note on 01/07/2018.  Past trials of Prozac, mirtazapine .  Past Medical History:  Past Medical History:  Diagnosis Date  . Anxiety   . Chronic combined systolic and diastolic CHF (congestive heart failure) ([place])   . Coronary artery disease   . Depression   . Diabetes mellitus without complication ([place])   . Diabetes mellitus, type II ([place])   . Hypertension   . MI (myocardial infarction) ([place])    x 5  . Pacemaker   . Thyroid disease     Past Surgical History:  Procedure Laterality Date  . CENTRAL LINE INSERTION  03/11/2017   Procedure: CENTRAL LINE INSERTION;  Surgeon: Leonie Man, MD;  Location: [facility];  Service: Cardiovascular;;  . CHOLECYSTECTOMY    . CORONARY STENT INTERVENTION W/IMPELLA N/A 03/11/2017   Procedure: Coronary Stent Intervention w/Impella;  Surgeon: Leonie Man, MD;  Location: [facility];  Service: Cardiovascular;  Laterality: N/A;  . EYE SURGERY    . INTRAVASCULAR PRESSURE WIRE/FFR STUDY N/A 03/11/2017   Procedure: INTRAVASCULAR PRESSURE WIRE/FFR STUDY;  Surgeon: Leonie Man, MD;  Location: [facility];  Service: Cardiovascular;  Laterality: N/A;  . INTRAVASCULAR ULTRASOUND/IVUS N/A 03/11/2017   Procedure: Intravascular Ultrasound/IVUS;  Surgeon: Leonie Man, MD;  Location: [facility];  Service: Cardiovascular;  Laterality: N/A;  . LEFT HEART CATH AND CORONARY ANGIOGRAPHY N/A 03/05/2017   Procedure: LEFT HEART CATH AND CORONARY ANGIOGRAPHY;  Surgeon: Isaias Cowman, MD;  Location: [facility];  Service: Cardiovascular;  Laterality: N/A;  . PACEMAKER IMPLANT    . pacemaker/defibrillator Left     Family Psychiatric History: Have reviewed family psychiatric history from my progress note on 01/07/2018.  Family History:  Family History  Problem Relation Age of Onset  . Hypertension Father   . Heart attack Father   . Depression Sister   . Depression Brother   . Depression Brother      Social History: Reviewed social history from my progress note on 01/07/2018. Social History   Socioeconomic History  . Marital status: Married    Spouse name: rodney  . Number of children: 2  . Years of education: Not on file  . Highest education level: High school graduate  Occupational History  . Not on file  Social Needs  . Financial resource strain: Not hard at all  . Food insecurity:    Worry: Never true    Inability: Never true  . Transportation needs:    Medical: Yes    Non-medical: Yes  Tobacco Use  . Smoking status: Current Every Day Smoker    Packs/day: 0.25    Types: E-cigarettes, Cigarettes  . Smokeless tobacco: Never Used  Substance and Sexual Activity  . Alcohol use: Not Currently    Comment: occasionally  . Drug use: No  . Sexual activity: Not Currently  Lifestyle  . Physical activity:    Days per week: 0 days    Minutes per session: 0 min  . Stress: Only a little  Relationships  . Social connections:    Talks on phone: Three times a week    Gets together: More than three times a week    Attends religious service: Never    Active member of club or organization: No    Attends meetings of clubs or organizations: Never    Relationship status: Married  Other Topics Concern  . Not on file  Social History Narrative  . Not on file    Allergies:  Allergies  Allergen Reactions  . Celebrex [Celecoxib] Anaphylaxis  . Glipizide Anaphylaxis  . Sulfa Antibiotics Other (See Comments) and Anaphylaxis    Reaction: unknown  . Levaquin [Levofloxacin In D5w] Other (See Comments)    Heart arrhthymias  . Levofloxacin Other (See Comments)    ICD fired  . Metformin Other (See Comments)    Gi tolerance   . Penicillins Rash and Other (See Comments)    Has patient had a PCN reaction causing immediate rash, facial/tongue/throat swelling, SOB or lightheadedness with hypotension: Unknown Has patient had a PCN reaction causing severe rash involving mucus membranes  or skin necrosis: No Has patient had a PCN reaction that required hospitalization: No Has patient had a PCN reaction occurring within the last 10 years: No If all of the above answers are "NO", then may proceed with Cephalosporin use.     Metabolic Disorder Labs: No results found for: HGBA1C, MPG No results found for: PROLACTIN No results found for: CHOL, TRIG, HDL, CHOLHDL, VLDL, LDLCALC No results found for: TSH  Therapeutic Level Labs: No results found for: LITHIUM No results found for: VALPROATE No components found for:  CBMZ  Current Medications: Current Outpatient Medications  Medication Sig Dispense Refill  . acetaminophen (TYLENOL) 500 MG tablet Take 500 mg by mouth every 4 (four) hours as needed for mild pain, fever or headache.     . albuterol (PROVENTIL HFA;VENTOLIN HFA) 108 (90 Base) MCG/ACT inhaler Inhale 2 puffs into the lungs every 4 (four)  hours as needed for wheezing or shortness of breath. 1 Inhaler 1  . aspirin EC 81 MG tablet Take 81 mg by mouth daily.    . clopidogrel (PLAVIX) 75 MG tablet Take 1 tablet (75 mg total) by mouth daily. 30 tablet 1  . Fluticasone-Salmeterol (ADVAIR DISKUS) 250-50 MCG/DOSE AEPB Inhale 1 puff into the lungs 2 (two) times daily. 1 each 1  . furosemide (LASIX) 40 MG tablet Take 40 mg by mouth daily as needed for fluid or edema.    Marland Kitchen lamoTRIgine (LAMICTAL) 25 MG tablet Take 2 tablets (50 mg total) by mouth daily. 180 tablet 0  . Levothyroxine Sodium 100 MCG CAPS Take 100 mcg by mouth daily.   3  . metFORMIN (GLUCOPHAGE-XR) 750 MG 24 hr tablet Take 375 mg by mouth 2 (two) times daily.     . metoprolol succinate (TOPROL-XL) 25 MG 24 hr tablet TAKE 1/2 TABLET BY MOUTH DAILY WITH OR IMMEDIATELY FOLLOWING A MEAL 30 tablet 3  . mexiletine (MEXITIL) 200 MG capsule Take 1 capsule (200 mg total) by mouth every 12 (twelve) hours. 60 capsule 1  . Multiple Vitamin (MULTIVITAMIN WITH MINERALS) TABS tablet Take 1 tablet by mouth daily.    .  nitroGLYCERIN (NITROSTAT) 0.4 MG SL tablet Place 0.4 mg under the tongue every 5 (five) minutes as needed for chest pain.    Marland Kitchen omega-3 acid ethyl esters (LOVAZA) 1 g capsule Take 2 g by mouth 2 (two) times daily.     . pioglitazone (ACTOS) 15 MG tablet Take 15 mg by mouth daily.    . potassium chloride SA (K-DUR,KLOR-CON) 20 MEQ tablet Take 1 tablet (20 mEq total) by mouth daily. (Patient taking differently: Take 20 mEq by mouth daily as needed (take along with lasix when needed). ) 30 tablet 1  . ranolazine (RANEXA) 500 MG 12 hr tablet Take 1 tablet (500 mg total) by mouth 2 (two) times daily. 60 tablet 1  . simvastatin (ZOCOR) 40 MG tablet Take 20 mg by mouth at bedtime.   2   No current facility-administered medications for this visit.      Musculoskeletal: Strength & Muscle Tone: within normal limits Gait & Station: normal Patient leans: N/A  Psychiatric Specialty Exam: Review of Systems  Psychiatric/Behavioral: Positive for depression.  All other systems reviewed and are negative.   Blood pressure 103/68, pulse 89, temperature (!) 97.5 F (36.4 C), temperature source Oral, weight 190 lb 6.4 oz (86.4 kg), SpO2 95 %.Body mass index is 32.68 kg/m.  General Appearance: Casual  Eye Contact:  Fair  Speech:  Normal Rate  Volume:  Normal  Mood:  Dysphoric improving  Affect:  Appropriate  Thought Process:  Goal Directed and Descriptions of Associations: Intact  Orientation:  Full (Time, Place, and Person)  Thought Content: Logical   Suicidal Thoughts:  No  Homicidal Thoughts:  No  Memory:  Immediate;   Fair Recent;   Fair Remote;   Fair  Judgement:  Fair  Insight:  Fair  Psychomotor Activity:  Normal  Concentration:  Concentration: Fair and Attention Span: Fair  Recall:  [employer] of Knowledge: Fair  Language: Fair  Akathisia:  No  Handed:  Right  AIMS (if indicated): na  Assets:  Communication Skills Desire for Improvement  ADL's:  Intact  Cognition: WNL  Sleep:   Fair   Screenings:   Assessment and Plan: Bryn is a 77 year old Caucasian female, married, retired, lives in [place], has a history of medical problems including  history of MI, status post stent placement, history of ICD placement, ischemic cardiomyopathy, history of drug induced torsades de pointes, history of prolonged QT, history of V. fib, hypothyroidism, hypertension, hyperlipidemia, portable oxygen, presented to the clinic today for a follow-up visit.  Patient is biologically predisposed given her multiple cardiac problems as well as other health problems and family history of mental health problems.  Patient also has psychosocial stressors of her own health issues.  Patient is currently in psychotherapy.  She reports some improvement in her mood the last 1 week.  She will  continue medications as noted below.  Plan MDD Patient's depressive symptoms which she describes as fatigue , tiredness are likely also due to her multiple medical problems, her need to have portable oxygen all the time and so on.  Patient does have an extensive cardiac history including drug-induced Torsades de pointes , prolonged QT and is aware about the effect of antidepressants on her cardiac health.  SSRI medications especially Zoloft is likely one of the safest based on studies done.  However we will continue to readjust her mood stabilizer Lamictal as well as will continue psychotherapy sessions right now since she is making some progress. Will increase Lamictal to 50 mg p.o. Daily.  Tobacco use disorder Provided smoking cessation counseling.  Not ready to quit now.  Patient will continue hypothyroidism management with her primary medical doctor.  Follow-up in clinic in 4 weeks or sooner if needed.  More than 50 % of the time was spent for psychoeducation and supportive psychotherapy and care coordination.  This note was generated in part or whole with voice recognition software. Voice recognition is usually  quite accurate but there are transcription errors that can and very often do occur. I apologize for any typographical errors that were not detected and corrected.        Ursula Alert, MD 02/05/2018, 11:27 AM

## 2018-02-12 ENCOUNTER — Ambulatory Visit (INDEPENDENT_AMBULATORY_CARE_PROVIDER_SITE_OTHER): Payer: Medicare Other | Admitting: Licensed Clinical Social Worker

## 2018-02-12 ENCOUNTER — Encounter: Payer: Self-pay | Admitting: Licensed Clinical Social Worker

## 2018-02-12 DIAGNOSIS — F330 Major depressive disorder, recurrent, mild: Secondary | ICD-10-CM

## 2018-02-12 NOTE — Progress Notes (Signed)
   THERAPIST PROGRESS NOTE  Session Time: 1000-1040  Participation Level: Active  Behavioral Response: Well GroomedAlertNA  Type of Therapy: Individual Therapy  Treatment Goals addressed: Coping  Interventions: Supportive  Summary: Heather Peterson is a 77 y.o. female who presents with MDD. [name] reports an improvement in her mood, and states her depressive symptoms have decreased over the last month. [name] states she is irritable, and becomes easily annoyed with her husband over "little things. Like, he doesn't notice the trash needs to go out." We discussed ways to improve communication with her husband, and ways to utilize assertive communication in order to effectively address issues within their household. [name] reported conflict with her children has decreased, and she is trying to recognize they are "just trying to help me." [name] reports she does not feel she needs to attend therapy on a regular basis, but stated she would prefer to come when/if she needs it. We discussed the pros/cons of this plan, and I informed [name] it was ultimately up to her how frequently she attends treatment.    Suicidal/Homicidal: No  Therapist Response: [name] reports feeling she does not need regular therapy sessions--but reported she would come if needed. I allowed her to make that choice, as she has previously asserted she felt she was not in charge of her own decisions anymore. I explained that, when and if she needs an appointment, I will be here to assist her in however I am able.   Plan: Return again in 4 weeks, if needed.   Diagnosis: Axis I: MDD recurrent, mild    Axis II: No diagnosis    Alden Hipp, LCSW 02/12/2018

## 2018-02-19 DIAGNOSIS — R600 Localized edema: Secondary | ICD-10-CM | POA: Insufficient documentation

## 2018-02-19 DIAGNOSIS — R0602 Shortness of breath: Secondary | ICD-10-CM | POA: Insufficient documentation

## 2018-03-03 ENCOUNTER — Encounter: Payer: Self-pay | Admitting: Cardiology

## 2018-03-05 ENCOUNTER — Ambulatory Visit (INDEPENDENT_AMBULATORY_CARE_PROVIDER_SITE_OTHER): Payer: Medicare Other | Admitting: Psychiatry

## 2018-03-05 ENCOUNTER — Encounter: Payer: Self-pay | Admitting: Psychiatry

## 2018-03-05 ENCOUNTER — Other Ambulatory Visit: Payer: Self-pay

## 2018-03-05 VITALS — BP 112/69 | HR 86 | Temp 97.7°F | Wt 184.2 lb

## 2018-03-05 DIAGNOSIS — I255 Ischemic cardiomyopathy: Secondary | ICD-10-CM | POA: Diagnosis not present

## 2018-03-05 DIAGNOSIS — F17200 Nicotine dependence, unspecified, uncomplicated: Secondary | ICD-10-CM

## 2018-03-05 DIAGNOSIS — F330 Major depressive disorder, recurrent, mild: Secondary | ICD-10-CM | POA: Diagnosis not present

## 2018-03-05 NOTE — Progress Notes (Signed)
[facility] MD OP Progress Note  03/05/2018 5:35 PM Heather Peterson  MRN:  [id number]  Chief Complaint:  Chief Complaint    Follow-up; Medication Refill     HPI: Heather Peterson is a 77 year old Caucasian female, married, retired, lives in [place], has a history of heart failure, history of MI status post stent placement, history of ventricular fibrillation, history of prolonged QT, ICD placement, V. tach, drug-induced torsades de pointes, mitral regurgitation, hypertension diabetes mellitus, hypothyroidism, on portable oxygen, presented to the clinic today for a follow-up visit.  She today reports she continues to feel tired which is her major stressor.  Patient however reports she is not depressed does not have a low mood, reports sleep is fair, reports her appetite is good, denies any anhedonia.  She reports she feels tired majority of the time and she is aware that it is due to her multiple physical problems .  Patient reports she wants to do things like she used to do before however due to her physical limitations as well as being on portable oxygen she is unable to do so.  She is compliant on her Lamictal.  She reports she has noticed that she gets a headache when she takes it late at night.  Discussed with patient that she can divide the dosage or take it early during the day.  Patient voiced understanding.  Patient was referred for psychotherapy sessions previously.  Patient reports she had at least 3 sessions with her therapist and reports she does not want to return for therapy.  She reports she is not noticing any benefit from her therapy sessions and does not understand why she has to continue it.  Discussed with patient that if she does not want to be in therapy here she can be referred out to another therapist.  Patient however reports she does not want any kind of therapy at this time.  Patient was advised to complete a PHQ 9 today.  Patient scored 5 on the same.  Patient at this time denies any  appetite changes, reports sleep is good, reports concentration is good, reports she is motivated to do things but however due to her physical limitations is unable to do so, does report feeling tired which also could be due to multiple medical problems .  Discussed with patient that her depression currently seems to be in remission.  However discussed with her that her tiredness and fatigue during the day may be long-term due to her other comorbid medical problems as well as being on multiple medications.  Discussed with patient that she can continue her medications as noted and if she wants to return for psychotherapy sessions she can do so.  Patient however declined therapy.  Visit Diagnosis:    ICD-10-CM   1. MDD (major depressive disorder), recurrent episode, mild (HCC) F33.0    improving  2. Tobacco use disorder F17.200     Past Psychiatric History: Have reviewed past psychiatric history from my progress note on 01/07/2018.  Past trials of Prozac, mirtazapine  Past Medical History:  Past Medical History:  Diagnosis Date  . Anxiety   . Chronic combined systolic and diastolic CHF (congestive heart failure) ([facility])   . Coronary artery disease   . Depression   . Diabetes mellitus without complication ([facility])   . Diabetes mellitus, type II ([facility])   . Hypertension   . MI (myocardial infarction) ([facility])    x 5  . Pacemaker   . Thyroid disease  Past Surgical History:  Procedure Laterality Date  . CENTRAL LINE INSERTION  03/11/2017   Procedure: CENTRAL LINE INSERTION;  Surgeon: Leonie Man, MD;  Location: [facility];  Service: Cardiovascular;;  . CHOLECYSTECTOMY    . CORONARY STENT INTERVENTION W/IMPELLA N/A 03/11/2017   Procedure: Coronary Stent Intervention w/Impella;  Surgeon: Leonie Man, MD;  Location: [facility];  Service: Cardiovascular;  Laterality: N/A;  . EYE SURGERY    . INTRAVASCULAR PRESSURE WIRE/FFR STUDY N/A 03/11/2017   Procedure: INTRAVASCULAR  PRESSURE WIRE/FFR STUDY;  Surgeon: Leonie Man, MD;  Location: [facility];  Service: Cardiovascular;  Laterality: N/A;  . INTRAVASCULAR ULTRASOUND/IVUS N/A 03/11/2017   Procedure: Intravascular Ultrasound/IVUS;  Surgeon: Leonie Man, MD;  Location: [facility];  Service: Cardiovascular;  Laterality: N/A;  . LEFT HEART CATH AND CORONARY ANGIOGRAPHY N/A 03/05/2017   Procedure: LEFT HEART CATH AND CORONARY ANGIOGRAPHY;  Surgeon: Isaias Cowman, MD;  Location: [facility];  Service: Cardiovascular;  Laterality: N/A;  . PACEMAKER IMPLANT    . pacemaker/defibrillator Left     Family Psychiatric History: Reviewed family psychiatric history from my progress note on 01/07/2018  Family History:  Family History  Problem Relation Age of Onset  . Hypertension Father   . Heart attack Father   . Depression Sister   . Depression Brother   . Depression Brother     Social History: Have reviewed social history from my progress note on 01/07/2018. Social History   Socioeconomic History  . Marital status: Married    Spouse name: rodney  . Number of children: 2  . Years of education: Not on file  . Highest education level: High school graduate  Occupational History  . Not on file  Social Needs  . Financial resource strain: Not hard at all  . Food insecurity:    Worry: Never true    Inability: Never true  . Transportation needs:    Medical: Yes    Non-medical: Yes  Tobacco Use  . Smoking status: Current Every Day Smoker    Packs/day: 0.25    Types: E-cigarettes, Cigarettes  . Smokeless tobacco: Never Used  Substance and Sexual Activity  . Alcohol use: Not Currently    Comment: occasionally  . Drug use: No  . Sexual activity: Not Currently  Lifestyle  . Physical activity:    Days per week: 0 days    Minutes per session: 0 min  . Stress: Only a little  Relationships  . Social connections:    Talks on phone: Three times a week    Gets together:  More than three times a week    Attends religious service: Never    Active member of club or organization: No    Attends meetings of clubs or organizations: Never    Relationship status: Married  Other Topics Concern  . Not on file  Social History Narrative  . Not on file    Allergies:  Allergies  Allergen Reactions  . Celebrex [Celecoxib] Anaphylaxis  . Glipizide Anaphylaxis  . Sulfa Antibiotics Other (See Comments) and Anaphylaxis    Reaction: unknown  . Levaquin [Levofloxacin In D5w] Other (See Comments)    Heart arrhthymias  . Levofloxacin Other (See Comments)    ICD fired  . Metformin Other (See Comments)    Gi tolerance   . Penicillins Rash and Other (See Comments)    Has patient had a PCN reaction causing immediate rash, facial/tongue/throat swelling,  SOB or lightheadedness with hypotension: Unknown Has patient had a PCN reaction causing severe rash involving mucus membranes or skin necrosis: No Has patient had a PCN reaction that required hospitalization: No Has patient had a PCN reaction occurring within the last 10 years: No If all of the above answers are "NO", then may proceed with Cephalosporin use.     Metabolic Disorder Labs: No results found for: HGBA1C, MPG No results found for: PROLACTIN No results found for: CHOL, TRIG, HDL, CHOLHDL, VLDL, LDLCALC No results found for: TSH  Therapeutic Level Labs: No results found for: LITHIUM No results found for: VALPROATE No components found for:  CBMZ  Current Medications: Current Outpatient Medications  Medication Sig Dispense Refill  . acetaminophen (TYLENOL) 500 MG tablet Take 500 mg by mouth every 4 (four) hours as needed for mild pain, fever or headache.     . albuterol (PROVENTIL HFA;VENTOLIN HFA) 108 (90 Base) MCG/ACT inhaler Inhale 2 puffs into the lungs every 4 (four) hours as needed for wheezing or shortness of breath. 1 Inhaler 1  . aspirin EC 81 MG tablet Take 81 mg by mouth daily.    . clopidogrel  (PLAVIX) 75 MG tablet Take 1 tablet (75 mg total) by mouth daily. 30 tablet 1  . furosemide (LASIX) 40 MG tablet Take 40 mg by mouth daily as needed for fluid or edema.    Marland Kitchen lamoTRIgine (LAMICTAL) 25 MG tablet Take 2 tablets (50 mg total) by mouth daily. 180 tablet 0  . levothyroxine (SYNTHROID, LEVOTHROID) 75 MCG tablet Take by mouth.    . metFORMIN (GLUCOPHAGE-XR) 750 MG 24 hr tablet Take 375 mg by mouth 2 (two) times daily.     . metoprolol succinate (TOPROL-XL) 25 MG 24 hr tablet TAKE 1/2 TABLET BY MOUTH DAILY WITH OR IMMEDIATELY FOLLOWING A MEAL 30 tablet 3  . mexiletine (MEXITIL) 200 MG capsule Take 1 capsule (200 mg total) by mouth every 12 (twelve) hours. 60 capsule 1  . Multiple Vitamin (MULTIVITAMIN WITH MINERALS) TABS tablet Take 1 tablet by mouth daily.    . nitroGLYCERIN (NITROSTAT) 0.4 MG SL tablet Place 0.4 mg under the tongue every 5 (five) minutes as needed for chest pain.    Marland Kitchen omega-3 acid ethyl esters (LOVAZA) 1 g capsule Take 2 g by mouth 2 (two) times daily.     . pioglitazone (ACTOS) 15 MG tablet Take 15 mg by mouth daily.    . potassium chloride SA (K-DUR,KLOR-CON) 20 MEQ tablet Take 1 tablet (20 mEq total) by mouth daily. (Patient taking differently: Take 20 mEq by mouth daily as needed (take along with lasix when needed). ) 30 tablet 1  . ranolazine (RANEXA) 500 MG 12 hr tablet Take 1 tablet (500 mg total) by mouth 2 (two) times daily. 60 tablet 1  . simvastatin (ZOCOR) 40 MG tablet Take 20 mg by mouth at bedtime.   2  . Fluticasone-Salmeterol (ADVAIR DISKUS) 250-50 MCG/DOSE AEPB Inhale 1 puff into the lungs 2 (two) times daily. 1 each 1   No current facility-administered medications for this visit.      Musculoskeletal: Strength & Muscle Tone: within normal limits Gait & Station: normal Patient leans: N/A  Psychiatric Specialty Exam: Review of Systems  Psychiatric/Behavioral: Positive for depression (improving).  All other systems reviewed and are negative.    Blood pressure 112/69, pulse 86, temperature 97.7 F (36.5 C), temperature source Oral, weight 184 lb 3.2 oz (83.6 kg), SpO2 92 %.Body mass index is 31.62 kg/m.  General Appearance: Casual  Eye Contact:  Fair  Speech:  Clear and Coherent  Volume:  Normal  Mood:  Euthymic  Affect:  Congruent  Thought Process:  Goal Directed and Descriptions of Associations: Intact  Orientation:  Full (Time, Place, and Person)  Thought Content: Logical   Suicidal Thoughts:  No  Homicidal Thoughts:  No  Memory:  Immediate;   Fair Recent;   Fair Remote;   Fair  Judgement:  Fair  Insight:  Fair  Psychomotor Activity:  Normal  Concentration:  Concentration: Fair and Attention Span: Fair  Recall:  [employer] of Knowledge: Fair  Language: Fair  Akathisia:  No  Handed:  Right  AIMS (if indicated): na  Assets:  Communication Skills Desire for Improvement Housing Social Support  ADL's:  Intact  Cognition: WNL  Sleep:  Fair   Screenings:   Assessment and Plan: Jenin is a 77 year old Caucasian female, married, retired, lives in [place], has a history of medical problems including history of MI, status post stent placement, history of ICD placement, ischemic cardiomyopathy, history of drug induced torsades de pointes , 3 of prolonged QT, history of V. fib, hypothyroidism, hypertension, mitral regurgitation, hyperlipidemia, on portable oxygen, presented to the clinic today for a follow-up visit.  Patient today reports that she has not noticed any changes in the way she feels.  However when Probation officer discussed depressive symptoms one by one with patient she denied any kind of depressive symptoms.  Patient reports she wants to do things like she used to do before which she is also aware she cannot.  She does have multiple medical problems and hence has physical limitations.  Some time was spent providing supportive therapy for the patient.  Patient declines further psychotherapy sessions.  Patient however  reports she would like to start picking up hobbies, may be  coloring or painting, taking up projects with friends and so on.  She reports she is motivated to start doing things on her own.  Discussed with patient she can continue the medication as prescribed and will not make any further changes today.  Plan MDD Lamictal 50 mg p.o. Daily. PHQ 9 done today-5.  Her depressive symptoms currently seems to be in remission.  Tobacco use disorder Provided smoking cessation counseling.  Patient declines further psychotherapy sessions at this time.  Discussed with patient to return to clinic in 2-3 months or sooner if needed.  More than 50 % of the time was spent for psychoeducation and supportive psychotherapy and care coordination.  This note was generated in part or whole with voice recognition software. Voice recognition is usually quite accurate but there are transcription errors that can and very often do occur. I apologize for any typographical errors that were not detected and corrected.      Ursula Alert, MD 03/05/2018, 5:35 PM

## 2018-03-15 ENCOUNTER — Ambulatory Visit
Admission: EM | Admit: 2018-03-15 | Discharge: 2018-03-15 | Disposition: A | Discharge status: Home / Self Care | Payer: Medicare Other | Attending: Emergency Medicine | Admitting: Emergency Medicine

## 2018-03-15 ENCOUNTER — Ambulatory Visit: Payer: Medicare Other

## 2018-03-15 ENCOUNTER — Encounter: Payer: Self-pay | Admitting: Emergency Medicine

## 2018-03-15 ENCOUNTER — Other Ambulatory Visit: Payer: Self-pay

## 2018-03-15 DIAGNOSIS — Z7952 Long term (current) use of systemic steroids: Secondary | ICD-10-CM | POA: Insufficient documentation

## 2018-03-15 DIAGNOSIS — Z79899 Other long term (current) drug therapy: Secondary | ICD-10-CM | POA: Diagnosis not present

## 2018-03-15 DIAGNOSIS — Z7984 Long term (current) use of oral hypoglycemic drugs: Secondary | ICD-10-CM | POA: Insufficient documentation

## 2018-03-15 DIAGNOSIS — Z818 Family history of other mental and behavioral disorders: Secondary | ICD-10-CM | POA: Diagnosis not present

## 2018-03-15 DIAGNOSIS — F17210 Nicotine dependence, cigarettes, uncomplicated: Secondary | ICD-10-CM | POA: Insufficient documentation

## 2018-03-15 DIAGNOSIS — I2510 Atherosclerotic heart disease of native coronary artery without angina pectoris: Secondary | ICD-10-CM | POA: Insufficient documentation

## 2018-03-15 DIAGNOSIS — Z79890 Hormone replacement therapy (postmenopausal): Secondary | ICD-10-CM | POA: Diagnosis not present

## 2018-03-15 DIAGNOSIS — R05 Cough: Secondary | ICD-10-CM

## 2018-03-15 DIAGNOSIS — E079 Disorder of thyroid, unspecified: Secondary | ICD-10-CM | POA: Diagnosis not present

## 2018-03-15 DIAGNOSIS — Z881 Allergy status to other antibiotic agents status: Secondary | ICD-10-CM | POA: Diagnosis not present

## 2018-03-15 DIAGNOSIS — Z7982 Long term (current) use of aspirin: Secondary | ICD-10-CM | POA: Diagnosis not present

## 2018-03-15 DIAGNOSIS — E785 Hyperlipidemia, unspecified: Secondary | ICD-10-CM | POA: Diagnosis not present

## 2018-03-15 DIAGNOSIS — Z886 Allergy status to analgesic agent status: Secondary | ICD-10-CM | POA: Diagnosis not present

## 2018-03-15 DIAGNOSIS — Z880 Allergy status to penicillin: Secondary | ICD-10-CM | POA: Diagnosis not present

## 2018-03-15 DIAGNOSIS — J0110 Acute frontal sinusitis, unspecified: Secondary | ICD-10-CM | POA: Insufficient documentation

## 2018-03-15 DIAGNOSIS — Z9049 Acquired absence of other specified parts of digestive tract: Secondary | ICD-10-CM | POA: Insufficient documentation

## 2018-03-15 DIAGNOSIS — Z8249 Family history of ischemic heart disease and other diseases of the circulatory system: Secondary | ICD-10-CM | POA: Diagnosis not present

## 2018-03-15 DIAGNOSIS — I110 Hypertensive heart disease with heart failure: Secondary | ICD-10-CM | POA: Insufficient documentation

## 2018-03-15 DIAGNOSIS — E119 Type 2 diabetes mellitus without complications: Secondary | ICD-10-CM | POA: Insufficient documentation

## 2018-03-15 DIAGNOSIS — J449 Chronic obstructive pulmonary disease, unspecified: Secondary | ICD-10-CM | POA: Diagnosis not present

## 2018-03-15 DIAGNOSIS — R0981 Nasal congestion: Secondary | ICD-10-CM | POA: Diagnosis present

## 2018-03-15 DIAGNOSIS — F419 Anxiety disorder, unspecified: Secondary | ICD-10-CM | POA: Insufficient documentation

## 2018-03-15 DIAGNOSIS — I5042 Chronic combined systolic (congestive) and diastolic (congestive) heart failure: Secondary | ICD-10-CM | POA: Insufficient documentation

## 2018-03-15 DIAGNOSIS — F329 Major depressive disorder, single episode, unspecified: Secondary | ICD-10-CM | POA: Insufficient documentation

## 2018-03-15 DIAGNOSIS — Z882 Allergy status to sulfonamides status: Secondary | ICD-10-CM | POA: Diagnosis not present

## 2018-03-15 DIAGNOSIS — Z9981 Dependence on supplemental oxygen: Secondary | ICD-10-CM | POA: Insufficient documentation

## 2018-03-15 DIAGNOSIS — Z9889 Other specified postprocedural states: Secondary | ICD-10-CM | POA: Diagnosis not present

## 2018-03-15 DIAGNOSIS — R059 Cough, unspecified: Secondary | ICD-10-CM

## 2018-03-15 MED ORDER — PREDNISONE 20 MG PO TABS: ORAL_TABLET | ORAL | 0 refills | Status: DC

## 2018-03-15 MED ORDER — DOXYCYCLINE HYCLATE 100 MG PO CAPS: 100.0000 mg | ORAL_CAPSULE | Freq: Two times a day (BID) | ORAL | 0 refills | Status: DC

## 2018-03-15 MED ORDER — BENZONATATE 100 MG PO CAPS: 100.0000 mg | ORAL_CAPSULE | Freq: Three times a day (TID) | ORAL | 0 refills | Status: DC | PRN

## 2018-03-15 NOTE — ED Triage Notes (Signed)
Patient c/o cough and congestion that started 2 weeks ago. Patient reports that the cough has progressively gotten worse.

## 2018-03-15 NOTE — ED Provider Notes (Signed)
[facility] ____________________________________________  Time seen: Approximately 3:19 PM  I have reviewed the triage vital signs and the nursing notes.   HISTORY  Chief Complaint Cough and Nasal Congestion   HPI Heather Peterson is a 77 y.o. female past medical history of diabetes, hypertension, MI, combined systolic and diastolic congestive heart failure, COPD and chronic oxygen use 2 L nasal cannula presenting for evaluation of 2 weeks of runny nose, nasal congestion and cough.  States symptoms initially started out what she describes as head cold symptoms with nasal congestion, sneezing and nasal drainage.  States that the last few days she feels that the drainage has led more into her chest with increased coughing.  Denies any acute shortness of breath.  No accompanying chest pain.  States getting a lot of thick greenish nasal drainage with blowing her nose and occasionally with cough.  Denies any persistent wheezing but does occasionally hear herself wheeze at night with laying down.  Has used occasional over-the-counter Mucinex without much change, but does report she used some Delsym which helped her some for cough.  Reports low-grade fevers not exceeding 99 over the last few days.  No antipyretic taken prior to arrival.  Has continued to eat and drink well.  Continues remain active.  Denies chest pain, shortness of breath, hemoptysis, extremity edema.  Denies recent sickness or recent antibiotic use.  Denies any recent cardiac changes.  [name], MD: PCP   Past Medical History:  Diagnosis Date  . Anxiety   . Chronic combined systolic and diastolic CHF (congestive heart failure) ([place])   . Coronary artery disease   . Depression   . Diabetes mellitus without complication ([place])   . Diabetes mellitus, type II ([place])   . Hypertension   . MI (myocardial infarction) ([place])    x 5  . Pacemaker   . Thyroid disease     Patient Active Problem List   Diagnosis  Date Noted  . Pedal edema 02/19/2018  . SOB (shortness of breath) on exertion 02/19/2018  . Left main coronary artery disease 03/07/2017  . Drug-induced torsades de pointes 03/06/2017  . Ventricular tachycardia ([place]) 03/05/2017  . Defibrillator discharge 03/04/2017  . Chronic combined systolic and diastolic CHF (congestive heart failure) ([place]) 03/04/2017  . COPD (chronic obstructive pulmonary disease) ([place]) 02/10/2017  . Diabetes mellitus type 2, uncomplicated ([place]) 25/42/7062  . H/O ventricular fibrillation 02/10/2017  . Hyperlipidemia 02/10/2017  . 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 Ischemic cardiomyopathy 02/10/2017  . Myocardial infarction ([place]) 02/10/2017  . Moderate mitral regurgitation 02/10/2017  . Syncope and collapse 02/04/2017  . CAD (coronary artery disease) 02/04/2017  . HTN (hypertension) 02/04/2017  . Diabetes ([place]) 02/04/2017    Past Surgical History:  Procedure Laterality Date  . CENTRAL LINE INSERTION  03/11/2017   Procedure: CENTRAL LINE INSERTION;  Surgeon: Leonie Man, MD;  Location: [facility];  Service: Cardiovascular;;  . CHOLECYSTECTOMY    . CORONARY STENT INTERVENTION W/IMPELLA N/A 03/11/2017   Procedure: Coronary Stent Intervention w/Impella;  Surgeon: Leonie Man, MD;  Location: [facility];  Service: Cardiovascular;  Laterality: N/A;  . EYE SURGERY    . INTRAVASCULAR PRESSURE WIRE/FFR STUDY N/A 03/11/2017   Procedure: INTRAVASCULAR PRESSURE WIRE/FFR STUDY;  Surgeon: Leonie Man, MD;  Location: [facility];  Service: Cardiovascular;  Laterality: N/A;  . INTRAVASCULAR ULTRASOUND/IVUS N/A 03/11/2017   Procedure: Intravascular Ultrasound/IVUS;  Surgeon: Leonie Man, MD;  Location:  [facility];  Service: Cardiovascular;  Laterality: N/A;  . LEFT HEART CATH AND CORONARY ANGIOGRAPHY N/A 03/05/2017   Procedure: LEFT HEART CATH AND CORONARY ANGIOGRAPHY;  Surgeon: Isaias Cowman, MD;  Location: [facility];  Service: Cardiovascular;  Laterality: N/A;  . PACEMAKER IMPLANT    . pacemaker/defibrillator Left      No current facility-administered medications for this encounter.   Current Outpatient Medications:  .  acetaminophen (TYLENOL) 500 MG tablet, Take 500 mg by mouth every 4 (four) hours as needed for mild pain, fever or headache. , Disp: , Rfl:  .  albuterol (PROVENTIL HFA;VENTOLIN HFA) 108 (90 Base) MCG/ACT inhaler, Inhale 2 puffs into the lungs every 4 (four) hours as needed for wheezing or shortness of breath., Disp: 1 Inhaler, Rfl: 1 .  aspirin EC 81 MG tablet, Take 81 mg by mouth daily., Disp: , Rfl:  .  clopidogrel (PLAVIX) 75 MG tablet, Take 1 tablet (75 mg total) by mouth daily., Disp: 30 tablet, Rfl: 1 .  furosemide (LASIX) 40 MG tablet, Take 40 mg by mouth daily as needed for fluid or edema., Disp: , Rfl:  .  lamoTRIgine (LAMICTAL) 25 MG tablet, Take 2 tablets (50 mg total) by mouth daily., Disp: 180 tablet, Rfl: 0 .  levothyroxine (SYNTHROID, LEVOTHROID) 75 MCG tablet, Take by mouth., Disp: , Rfl:  .  metFORMIN (GLUCOPHAGE-XR) 750 MG 24 hr tablet, Take 375 mg by mouth 2 (two) times daily. , Disp: , Rfl:  .  mexiletine (MEXITIL) 200 MG capsule, Take 1 capsule (200 mg total) by mouth every 12 (twelve) hours., Disp: 60 capsule, Rfl: 1 .  Multiple Vitamin (MULTIVITAMIN WITH MINERALS) TABS tablet, Take 1 tablet by mouth daily., Disp: , Rfl:  .  nitroGLYCERIN (NITROSTAT) 0.4 MG SL tablet, Place 0.4 mg under the tongue every 5 (five) minutes as needed for chest pain., Disp: , Rfl:  .  omega-3 acid ethyl esters (LOVAZA) 1 g capsule, Take 2 g by mouth 2 (two) times daily. , Disp: , Rfl:  .  pioglitazone (ACTOS) 15 MG tablet, Take 15 mg by mouth daily., Disp: , Rfl:  .  potassium chloride SA (K-DUR,KLOR-CON) 20 MEQ tablet, Take 1 tablet (20 mEq total) by mouth daily. (Patient taking differently: Take 20 mEq by mouth daily as needed (take along  with lasix when needed). ), Disp: 30 tablet, Rfl: 1 .  ranolazine (RANEXA) 500 MG 12 hr tablet, Take 1 tablet (500 mg total) by mouth 2 (two) times daily., Disp: 60 tablet, Rfl: 1 .  simvastatin (ZOCOR) 40 MG tablet, Take 20 mg by mouth at bedtime. , Disp: , Rfl: 2 .  benzonatate (TESSALON PERLES) 100 MG capsule, Take 1 capsule (100 mg total) by mouth 3 (three) times daily as needed for cough., Disp: 15 capsule, Rfl: 0 .  doxycycline (VIBRAMYCIN) 100 MG capsule, Take 1 capsule (100 mg total) by mouth 2 (two) times daily., Disp: 20 capsule, Rfl: 0 .  Fluticasone-Salmeterol (ADVAIR DISKUS) 250-50 MCG/DOSE AEPB, Inhale 1 puff into the lungs 2 (two) times daily., Disp: 1 each, Rfl: 1 .  metoprolol succinate (TOPROL-XL) 25 MG 24 hr tablet, TAKE 1/2 TABLET BY MOUTH DAILY WITH OR IMMEDIATELY FOLLOWING A MEAL, Disp: 30 tablet, Rfl: 3 .  predniSONE (DELTASONE) 20 MG tablet, Take 2 tablets 40 mg total by mouth daily for 3 days, then take 1 tablet 20 mg total by mouth daily for 2 days., Disp: 8 tablet, Rfl: 0  Allergies Celebrex [celecoxib];  Glipizide; Sulfa antibiotics; Levaquin [levofloxacin in d5w]; Levofloxacin; Metformin; and Penicillins  Family History  Problem Relation Age of Onset  . Hypertension Father   . Heart attack Father   . Depression Sister   . Depression Brother   . Depression Brother     Social History Social History   Tobacco Use  . Smoking status: Current Every Day Smoker    Packs/day: 0.25    Types: E-cigarettes, Cigarettes  . Smokeless tobacco: Never Used  Substance Use Topics  . Alcohol use: Not Currently    Comment: occasionally  . Drug use: No    Review of Systems Constitutional: Reports possible fevers. Eyes: No visual changes. ENT: AS above.  Cardiovascular: Denies chest pain. Respiratory: Denies shortness of breath. Gastrointestinal: No abdominal pain.   Skin: Negative for rash. Neurological: Negative for focal weakness or  numbness.   ____________________________________________   PHYSICAL EXAM:  VITAL SIGNS: ED Triage Vitals  Enc Vitals Group     BP 03/15/18 1408 108/62     Pulse Rate 03/15/18 1408 82     Resp 03/15/18 1408 18     Temp 03/15/18 1408 98.5 F (36.9 C)     Temp Source 03/15/18 1408 Oral     SpO2 03/15/18 1408 97 %     Weight 03/15/18 1409 180 lb (81.6 kg)     Height 03/15/18 1409 5\' 4"  (1.626 m)     Head Circumference --      Peak Flow --      Pain Score 03/15/18 1409 0     Pain Loc --      Pain Edu? --      Excl. in [place]? --     Constitutional: Alert and oriented. Well appearing and in no acute distress. Eyes: Conjunctivae are normal.  Head: Atraumatic.Mild to moderate tenderness to palpation bilateral frontal and nontender maxillary sinuses. No swelling. No erythema.   Ears: no erythema, normal TMs bilaterally.   Nose: nasal congestion with bilateral nasal turbinate erythema and edema.   Mouth/Throat: Mucous membranes are moist. Oropharynx non-erythematous.No tonsillar swelling or exudate.  Neck: No stridor.  No cervical spine tenderness to palpation. Hematological/Lymphatic/Immunilogical: No cervical lymphadenopathy. Cardiovascular: Normal rate, regular rhythm. Grossly normal heart sounds.  Good peripheral circulation. Respiratory: Normal respiratory effort.  No retractions.  Mild scattered rhonchi.  No wheezes.  Speaks in complete sentences.  Good air movement.  Musculoskeletal: Steady gait.  No lower extremity edema noted bilaterally. Neurologic:  Normal speech and language. No gait instability. Skin:  Skin is warm, dry and intact. No rash noted. Psychiatric: Mood and affect are normal. Speech and behavior are normal.   ___________________________________________   LABS (all labs ordered are listed, but only abnormal results are displayed)  Labs Reviewed - No data to display ____________________________________________  RADIOLOGY  Dg Chest 2 View  Result Date:  03/15/2018 CLINICAL DATA:  Productive cough and congestion for 2 weeks. EXAM: CHEST - 2 VIEW COMPARISON:  03/07/2017 FINDINGS: Dual lead cardiac pacemaker in stable position. Mildly enlarged cardiac silhouette. Mediastinal contours appear intact. Calcific atherosclerotic disease and tortuosity of the aorta. There is no evidence of pleural effusion or pneumothorax. Persistent right greater than left lower lobe peribronchial airspace opacities. Osseous structures are without acute abnormality. Soft tissues are grossly normal. IMPRESSION: Persistent right greater than left lower lobe peribronchial airspace opacities. Enlarged cardiac silhouette. No evidence of pulmonary edema. Electronically Signed   By: Fidela Salisbury M.D.   On: 03/15/2018 15:24   ____________________________________________  PROCEDURES Procedures     INITIAL IMPRESSION / ASSESSMENT AND PLAN / ED COURSE  Pertinent labs & imaging results that were available during my care of the patient were reviewed by me and considered in my medical decision making (see chart for details).  Well-appearing patient.  No acute distress.  Suspect recent viral upper respiratory infection.  Patient with CHF and COPD on chronic O2 therapy.  Chest x-ray as above per radiologist reviewed by myself, persistent right greater than left lobe peribronchial airspace opacities, no evidence of pulmonary edema.  Discussed results in detail with patient and daughter at bedside, appearance similar to previous imaging and recommend further follow-up outpatient.  Will start patient on oral doxycycline, prednisone, PRN Tessalon Perles and continue home albuterol as needed.  Encourage rest, fluids, supportive care.Discussed indication, risks and benefits of medications with patient.  Discussed follow up with Primary care physician this week. Discussed follow up and return parameters including no resolution or any worsening concerns. Patient verbalized understanding  and agreed to plan.   ____________________________________________   FINAL CLINICAL IMPRESSION(S) / ED DIAGNOSES  Final diagnoses:  Acute frontal sinusitis, recurrence not specified  Cough     ED Discharge Orders         Ordered    doxycycline (VIBRAMYCIN) 100 MG capsule  2 times daily     03/15/18 1531    predniSONE (DELTASONE) 20 MG tablet     03/15/18 1531    benzonatate (TESSALON PERLES) 100 MG capsule  3 times daily PRN     03/15/18 1531           Note: This dictation was prepared with Dragon dictation along with smaller phrase technology. Any transcriptional errors that result from this process are unintentional.         Marylene Land, NP 03/15/18 1637

## 2018-03-15 NOTE — Discharge Instructions (Addendum)
Take medication as prescribed. Rest. Drink plenty of fluids.  Use home albuterol inhaler as needed for wheezing.  Follow up with your primary care physician this week for follow-up.    Return to Urgent care for new or worsening concerns.

## 2018-03-17 ENCOUNTER — Other Ambulatory Visit: Payer: Self-pay | Admitting: Internal Medicine

## 2018-03-17 NOTE — Telephone Encounter (Signed)
This is a [facility] pt 

## 2018-03-19 ENCOUNTER — Other Ambulatory Visit: Payer: Self-pay | Admitting: Family Medicine

## 2018-03-19 DIAGNOSIS — R911 Solitary pulmonary nodule: Secondary | ICD-10-CM

## 2018-03-23 ENCOUNTER — Ambulatory Visit: Payer: Medicare Other

## 2018-03-30 ENCOUNTER — Ambulatory Visit: Payer: Medicare Other

## 2018-03-31 ENCOUNTER — Ambulatory Visit
Admission: RE | Admit: 2018-03-31 | Discharge: 2018-03-31 | Disposition: A | Discharge status: Home / Self Care | Payer: Medicare Other | Source: Ambulatory Visit | Attending: Family Medicine | Admitting: Family Medicine

## 2018-03-31 DIAGNOSIS — D3501 Benign neoplasm of right adrenal gland: Secondary | ICD-10-CM | POA: Diagnosis not present

## 2018-03-31 DIAGNOSIS — R918 Other nonspecific abnormal finding of lung field: Secondary | ICD-10-CM | POA: Insufficient documentation

## 2018-03-31 DIAGNOSIS — I700 Atherosclerosis of aorta: Secondary | ICD-10-CM | POA: Diagnosis not present

## 2018-03-31 DIAGNOSIS — R911 Solitary pulmonary nodule: Secondary | ICD-10-CM | POA: Diagnosis present

## 2018-03-31 DIAGNOSIS — D3502 Benign neoplasm of left adrenal gland: Secondary | ICD-10-CM | POA: Diagnosis not present

## 2018-03-31 DIAGNOSIS — J432 Centrilobular emphysema: Secondary | ICD-10-CM | POA: Diagnosis not present

## 2018-04-23 ENCOUNTER — Other Ambulatory Visit: Payer: Self-pay | Admitting: Internal Medicine

## 2018-04-24 NOTE — Telephone Encounter (Signed)
Lmovm to let pt know to contact her cardiologist.

## 2018-04-24 NOTE — Telephone Encounter (Signed)
Patient transferred her care back to Dr. Josefa Half at [facility].  They will need to fill this prescription.   Thanks!

## 2018-04-24 NOTE — Telephone Encounter (Signed)
Please advise if ok to refill Metoprolol Succinate.

## 2018-05-12 DIAGNOSIS — E063 Autoimmune thyroiditis: Secondary | ICD-10-CM | POA: Insufficient documentation

## 2018-06-05 ENCOUNTER — Encounter: Payer: Self-pay | Admitting: Psychiatry

## 2018-06-05 ENCOUNTER — Ambulatory Visit (INDEPENDENT_AMBULATORY_CARE_PROVIDER_SITE_OTHER): Payer: Medicare Other | Admitting: Psychiatry

## 2018-06-05 VITALS — BP 115/76 | HR 80 | Temp 97.4°F | Wt 185.4 lb

## 2018-06-05 DIAGNOSIS — F17200 Nicotine dependence, unspecified, uncomplicated: Secondary | ICD-10-CM

## 2018-06-05 DIAGNOSIS — F330 Major depressive disorder, recurrent, mild: Secondary | ICD-10-CM | POA: Diagnosis not present

## 2018-06-05 MED ORDER — LAMOTRIGINE 25 MG PO TABS: 75.0000 mg | ORAL_TABLET | Freq: Every day | ORAL | 0 refills | Status: DC

## 2018-06-05 NOTE — Progress Notes (Signed)
[facility] Progress Note  06/05/2018 12:42 PM MAAHI LANNAN  MRN:  [id number]  Chief Complaint:  Chief Complaint    Follow-up    ' I am here for follow up.'  HPI: Heather Peterson is a 78 year old Caucasian female, married, retired, lives in [place], has a history of heart failure, history of MI status post stent placement, history of ventricular fibrillation, history of prolonged QT, ICD placement, V. tach, drug-induced torsades de pointes, moderate mitral regurgitation, hypertension, diabetes melitis, hypothyroidism on portable oxygen, presented to clinic today for a follow-up visit.    Reports she has been taking her Lamictal as prescribed.  She denies any side effects to the medication.  She however reports she has been struggling with some low mood and anxiety symptoms recently.  She wonders whether her medications can be readjusted.  Patient reports sleep is fair.  Patient reports she stopped psychotherapy sessions and she felt it was not very helpful.  She has been trying to stay active herself.  Patient with TSH abnormalities, currently has been following up with an endocrinologist.  I have reviewed medical records in [facility] per Dr. Honor Junes dated 05/12/2018-' patient's TSH came back abnormal and her levothyroxine has been reduced to 75 mcg for 6 days and 37.5 mcg 1 day/week.  Patient has follow-up visit for rechecking her labs.'  Patient denies any other concerns today.   Visit Diagnosis:    ICD-10-CM   1. MDD (major depressive disorder), recurrent episode, mild (HCC) F33.0 lamoTRIgine (LAMICTAL) 25 MG tablet  2. Tobacco use disorder F17.200     Past Psychiatric History: Reviewed past psychiatric history from my progress note on 01/07/2018.  Past trials of Prozac, mirtazapine     Past Medical History:  Past Medical History:  Diagnosis Date  . Anxiety   . Chronic combined systolic and diastolic CHF (congestive heart failure) ([place])   . Coronary artery disease   . Depression   .  Diabetes mellitus without complication ([place])   . Diabetes mellitus, type II ([place])   . Hypertension   . MI (myocardial infarction) ([place])    x 5  . Pacemaker   . Thyroid disease     Past Surgical History:  Procedure Laterality Date  . CENTRAL LINE INSERTION  03/11/2017   Procedure: CENTRAL LINE INSERTION;  Surgeon: Leonie Man, MD;  Location: [facility];  Service: Cardiovascular;;  . CHOLECYSTECTOMY    . CORONARY STENT INTERVENTION W/IMPELLA N/A 03/11/2017   Procedure: Coronary Stent Intervention w/Impella;  Surgeon: Leonie Man, MD;  Location: [facility];  Service: Cardiovascular;  Laterality: N/A;  . EYE SURGERY    . INTRAVASCULAR PRESSURE WIRE/FFR STUDY N/A 03/11/2017   Procedure: INTRAVASCULAR PRESSURE WIRE/FFR STUDY;  Surgeon: Leonie Man, MD;  Location: [facility];  Service: Cardiovascular;  Laterality: N/A;  . INTRAVASCULAR ULTRASOUND/IVUS N/A 03/11/2017   Procedure: Intravascular Ultrasound/IVUS;  Surgeon: Leonie Man, MD;  Location: [facility];  Service: Cardiovascular;  Laterality: N/A;  . LEFT HEART CATH AND CORONARY ANGIOGRAPHY N/A 03/05/2017   Procedure: LEFT HEART CATH AND CORONARY ANGIOGRAPHY;  Surgeon: Isaias Cowman, MD;  Location: [facility];  Service: Cardiovascular;  Laterality: N/A;  . PACEMAKER IMPLANT    . pacemaker/defibrillator Left     Family Psychiatric History: Reviewed family psychiatric history from my progress note on 01/07/2018.  Family History:  Family History  Problem Relation Age of Onset  . Hypertension Father   . Heart  attack Father   . Depression Sister   . Depression Brother   . Depression Brother     Social History: Reviewed social history from my progress note on 01/07/2018. Social History   Socioeconomic History  . Marital status: Married    Spouse name: rodney  . Number of children: 2  . Years of education: Not on file  . Highest education level: High school  graduate  Occupational History  . Not on file  Social Needs  . Financial resource strain: Not hard at all  . Food insecurity:    Worry: Never true    Inability: Never true  . Transportation needs:    Medical: Yes    Non-medical: Yes  Tobacco Use  . Smoking status: Current Every Day Smoker    Packs/day: 0.25    Types: E-cigarettes, Cigarettes  . Smokeless tobacco: Never Used  Substance and Sexual Activity  . Alcohol use: Not Currently    Comment: occasionally  . Drug use: No  . Sexual activity: Not Currently  Lifestyle  . Physical activity:    Days per week: 0 days    Minutes per session: 0 min  . Stress: Only a little  Relationships  . Social connections:    Talks on phone: Three times a week    Gets together: More than three times a week    Attends religious service: Never    Active member of club or organization: No    Attends meetings of clubs or organizations: Never    Relationship status: Married  Other Topics Concern  . Not on file  Social History Narrative  . Not on file    Allergies:  Allergies  Allergen Reactions  . Celebrex [Celecoxib] Anaphylaxis  . Glipizide Anaphylaxis  . Sulfa Antibiotics Other (See Comments) and Anaphylaxis    Reaction: unknown  . Levaquin [Levofloxacin In D5w] Other (See Comments)    Heart arrhthymias  . Levofloxacin Other (See Comments)    ICD fired  . Metformin Other (See Comments)    Gi tolerance   . Penicillins Rash and Other (See Comments)    Has patient had a PCN reaction causing immediate rash, facial/tongue/throat swelling, SOB or lightheadedness with hypotension: Unknown Has patient had a PCN reaction causing severe rash involving mucus membranes or skin necrosis: No Has patient had a PCN reaction that required hospitalization: No Has patient had a PCN reaction occurring within the last 10 years: No If all of the above answers are "NO", then may proceed with Cephalosporin use.     Metabolic Disorder Labs: No  results found for: HGBA1C, MPG No results found for: PROLACTIN No results found for: CHOL, TRIG, HDL, CHOLHDL, VLDL, LDLCALC No results found for: TSH  Therapeutic Level Labs: No results found for: LITHIUM No results found for: VALPROATE No components found for:  CBMZ  Current Medications: Current Outpatient Medications  Medication Sig Dispense Refill  . acetaminophen (TYLENOL) 500 MG tablet Take 500 mg by mouth every 4 (four) hours as needed for mild pain, fever or headache.     . albuterol (PROVENTIL HFA;VENTOLIN HFA) 108 (90 Base) MCG/ACT inhaler Inhale 2 puffs into the lungs every 4 (four) hours as needed for wheezing or shortness of breath. 1 Inhaler 1  . aspirin EC 81 MG tablet Take 81 mg by mouth daily.    . benzonatate (TESSALON PERLES) 100 MG capsule Take 1 capsule (100 mg total) by mouth 3 (three) times daily as needed for cough. 15 capsule 0  .  clopidogrel (PLAVIX) 75 MG tablet Take 1 tablet (75 mg total) by mouth daily. 30 tablet 1  . doxycycline (VIBRAMYCIN) 100 MG capsule Take 1 capsule (100 mg total) by mouth 2 (two) times daily. 20 capsule 0  . furosemide (LASIX) 40 MG tablet Take 40 mg by mouth daily as needed for fluid or edema.    Marland Kitchen lamoTRIgine (LAMICTAL) 25 MG tablet Take 3 tablets (75 mg total) by mouth daily. 270 tablet 0  . levothyroxine (SYNTHROID, LEVOTHROID) 75 MCG tablet Take by mouth.    . metFORMIN (GLUCOPHAGE-XR) 750 MG 24 hr tablet Take 375 mg by mouth 2 (two) times daily.     . metoprolol succinate (TOPROL-XL) 25 MG 24 hr tablet TAKE 1/2 TABLET BY MOUTH DAILY WITH OR IMMEDIATELY FOLLOWING A MEAL 30 tablet 1  . mexiletine (MEXITIL) 200 MG capsule Take 1 capsule (200 mg total) by mouth every 12 (twelve) hours. 60 capsule 1  . Multiple Vitamin (MULTIVITAMIN WITH MINERALS) TABS tablet Take 1 tablet by mouth daily.    . nitroGLYCERIN (NITROSTAT) 0.4 MG SL tablet Place 0.4 mg under the tongue every 5 (five) minutes as needed for chest pain.    Marland Kitchen omega-3 acid ethyl  esters (LOVAZA) 1 g capsule Take 2 g by mouth 2 (two) times daily.     . pioglitazone (ACTOS) 15 MG tablet Take 15 mg by mouth daily.    . potassium chloride SA (K-DUR,KLOR-CON) 20 MEQ tablet Take 1 tablet (20 mEq total) by mouth daily. (Patient taking differently: Take 20 mEq by mouth daily as needed (take along with lasix when needed). ) 30 tablet 1  . predniSONE (DELTASONE) 20 MG tablet Take 2 tablets 40 mg total by mouth daily for 3 days, then take 1 tablet 20 mg total by mouth daily for 2 days. 8 tablet 0  . ranolazine (RANEXA) 500 MG 12 hr tablet Take 1 tablet (500 mg total) by mouth 2 (two) times daily. 60 tablet 1  . simvastatin (ZOCOR) 40 MG tablet Take 20 mg by mouth at bedtime.   2  . Fluticasone-Salmeterol (ADVAIR DISKUS) 250-50 MCG/DOSE AEPB Inhale 1 puff into the lungs 2 (two) times daily. 1 each 1   No current facility-administered medications for this visit.      Musculoskeletal: Strength & Muscle Tone: within normal limits Gait & Station: normal Patient leans: N/A  Psychiatric Specialty Exam: Review of Systems  Psychiatric/Behavioral: Positive for depression.  All other systems reviewed and are negative.   Blood pressure 115/76, pulse 80, temperature (!) 97.4 F (36.3 C), temperature source Oral, weight 185 lb 6.4 oz (84.1 kg), SpO2 96 %.Body mass index is 31.82 kg/m.  General Appearance: Casual  Eye Contact:  Fair  Speech:  Clear and Coherent  Volume:  Normal  Mood:  Dysphoric  Affect:  Appropriate  Thought Process:  Goal Directed and Descriptions of Associations: Intact  Orientation:  Full (Time, Place, and Person)  Thought Content: Logical   Suicidal Thoughts:  No  Homicidal Thoughts:  No  Memory:  Immediate;   Fair Recent;   Fair Remote;   Fair  Judgement:  Fair  Insight:  Fair  Psychomotor Activity:  Normal  Concentration:  Concentration: Fair and Attention Span: Fair  Recall:  [employer] of Knowledge: Fair  Language: Fair  Akathisia:  No  Handed:   Right  AIMS (if indicated): denies tremors, rigidity,stiffness  Assets:  Communication Skills Desire for Improvement Social Support  ADL's:  Intact  Cognition: WNL  Sleep:  Fair   Screenings:   Assessment and Plan: Rollande is a 78 year old Caucasian female, married, retired, lives in [place], has a history of medical problems including history of MI, status post stent placement, history of ICD placement, ischemic cardiomyopathy, history of drug-induced torsades de pointes , history of prolonged QT, history of V. fib, hypothyroidism, hypertension, mitral regurgitation, hyperlipidemia, on portable oxygen, presented to clinic today for a follow-up visit.  Patient continues to struggle with some depressive symptoms.  Will continue to make medication readjustment.  Patient has been noncompliant with therapy visits and does not want to restart therapy at this time.  Plan MDD-unstable Increase lamotrigine to 75 mg p.o. daily Patient declines psychotherapy referral.  Tobacco use disorder- unstable Provided smoking cessation counseling.  Patient will continue to follow-up with her endocrinologist for her TSH abnormalities.  Discussed with patient that thyroid abnormalities can also contribute to her mood symptoms.  I have reviewed notes from [facility] per Dr. Manfred Shirts dated 05/12/2018 as summarized above.  Follow-up in clinic in 1 to 2 months or sooner if needed.  I have spent atleast 15 minutes face to face with patient today. More than 50 % of the time was spent for psychoeducation and supportive psychotherapy and care coordination.  This note was generated in part or whole with voice recognition software. Voice recognition is usually quite accurate but there are transcription errors that can and very often do occur. I apologize for any typographical errors that were not detected and corrected.           Ursula Alert, MD 06/05/2018, 12:42 PM

## 2018-08-04 ENCOUNTER — Ambulatory Visit: Payer: Medicare Other | Admitting: Psychiatry

## 2018-08-30 ENCOUNTER — Other Ambulatory Visit: Payer: Self-pay | Admitting: Psychiatry

## 2018-08-30 DIAGNOSIS — F330 Major depressive disorder, recurrent, mild: Secondary | ICD-10-CM

## 2018-11-09 ENCOUNTER — Other Ambulatory Visit: Payer: Self-pay | Admitting: Internal Medicine

## 2018-11-23 ENCOUNTER — Other Ambulatory Visit: Payer: Self-pay

## 2018-11-30 ENCOUNTER — Other Ambulatory Visit: Payer: Self-pay | Admitting: Psychiatry

## 2018-11-30 DIAGNOSIS — F330 Major depressive disorder, recurrent, mild: Secondary | ICD-10-CM

## 2018-12-09 ENCOUNTER — Telehealth: Payer: Self-pay

## 2018-12-09 DIAGNOSIS — F330 Major depressive disorder, recurrent, mild: Secondary | ICD-10-CM

## 2018-12-09 MED ORDER — LAMOTRIGINE 25 MG PO TABS: ORAL_TABLET | ORAL | 0 refills | Status: DC

## 2018-12-09 NOTE — Telephone Encounter (Signed)
pt needs refills on the lamictal pt has appt for 12-14-18

## 2018-12-09 NOTE — Telephone Encounter (Signed)
Sent Lamictal to pharmacy

## 2018-12-13 ENCOUNTER — Other Ambulatory Visit: Payer: Self-pay | Admitting: Internal Medicine

## 2018-12-14 ENCOUNTER — Ambulatory Visit (INDEPENDENT_AMBULATORY_CARE_PROVIDER_SITE_OTHER): Payer: Medicare Other | Admitting: Psychiatry

## 2018-12-14 ENCOUNTER — Encounter: Payer: Self-pay | Admitting: Psychiatry

## 2018-12-14 ENCOUNTER — Other Ambulatory Visit: Payer: Self-pay

## 2018-12-14 DIAGNOSIS — F17200 Nicotine dependence, unspecified, uncomplicated: Secondary | ICD-10-CM | POA: Diagnosis not present

## 2018-12-14 DIAGNOSIS — Z9189 Other specified personal risk factors, not elsewhere classified: Secondary | ICD-10-CM | POA: Diagnosis not present

## 2018-12-14 DIAGNOSIS — F330 Major depressive disorder, recurrent, mild: Secondary | ICD-10-CM | POA: Diagnosis not present

## 2018-12-14 MED ORDER — LAMOTRIGINE 25 MG PO TABS: ORAL_TABLET | ORAL | 0 refills | Status: DC

## 2018-12-14 NOTE — Progress Notes (Signed)
Virtual Visit via Telephone Note  I connected with Heather Peterson on 12/14/18 at  8:30 AM EDT by telephone and verified that I am speaking with the correct person using two identifiers.   I discussed the limitations, risks, security and privacy concerns of performing an evaluation and management service by telephone and the availability of in person appointments. I also discussed with the patient that there may be a patient responsible charge related to this service. The patient expressed understanding and agreed to proceed.   I discussed the assessment and treatment plan with the patient. The patient was provided an opportunity to ask questions and all were answered. The patient agreed with the plan and demonstrated an understanding of the instructions.   The patient was advised to call back or seek an in-person evaluation if the symptoms worsen or if the condition fails to improve as anticipated.   [employer] MD OP Progress Note  12/14/2018 5:41 PM [name]  MRN:  [id number]  Chief Complaint:  Chief Complaint    Follow-up     HPI: Heather Peterson is a 78 year old Caucasian female, married, retired, lives in [place], has a history of heart failure, history of MI status post stent placement, history of ventricular fibrillation, history of prolonged QT, ICD placement, V. tach, drug-induced torsades de pointes , moderate mitral regurgitation, hypertension, diabetes melitis, hypothyroidism, on portable oxygen was evaluated by phone today.  Patient preferred to do a phone call.  The last time patient was seen in clinic was on 06/05/2018.  Patient today returns for a visit.  She reports she had ran out of her medication for couple of days however restarted on it.  Patient reports ever since she restarted the medication she feels some of the adverse side effects have increased.  She reports she continues to struggle with excessive sleep during the day, fatigue, tiredness, nausea.  Patient reports she  does not think the Lamictal is working.  Patient reports she also has TSH abnormalities and her endocrinologist is trying to increase her medication gradually.  Patient denies any suicidality, homicidality or perceptual disturbances.  Patient has been noncompliant with her therapy visits.  Discussed with patient about her multiple medical problems including history of prolonged QT syndrome.  Discussed with patient about needing a new EKG done to monitor her QT.  Also discussed with her that we can taper her off of the lamotrigine.  However her TSH abnormalities could be contributing to some of her symptoms.   Visit Diagnosis:    ICD-10-CM   1. MDD (major depressive disorder), recurrent episode, mild (HCC)  F33.0 lamoTRIgine (LAMICTAL) 25 MG tablet    EKG 12-Lead  2. Tobacco use disorder  F17.200   3. At risk for prolonged QT interval syndrome  Z91.89     Past Psychiatric History: I have reviewed past psychiatric history from my progress note on 01/07/2018.  Past trials of Prozac, mirtazapine.  Past Medical History:  Past Medical History:  Diagnosis Date  . Anxiety   . Chronic combined systolic and diastolic CHF (congestive heart failure) ([place])   . Coronary artery disease   . Depression   . Diabetes mellitus without complication ([place])   . Diabetes mellitus, type II ([place])   . Hypertension   . MI (myocardial infarction) ([place])    x 5  . Pacemaker   . Thyroid disease     Past Surgical History:  Procedure Laterality Date  . CENTRAL LINE INSERTION  03/11/2017   Procedure: CENTRAL LINE  INSERTION;  Surgeon: Leonie Man, MD;  Location: [facility];  Service: Cardiovascular;;  . CHOLECYSTECTOMY    . CORONARY STENT INTERVENTION W/IMPELLA N/A 03/11/2017   Procedure: Coronary Stent Intervention w/Impella;  Surgeon: Leonie Man, MD;  Location: [facility];  Service: Cardiovascular;  Laterality: N/A;  . EYE SURGERY    . INTRAVASCULAR PRESSURE WIRE/FFR STUDY N/A  03/11/2017   Procedure: INTRAVASCULAR PRESSURE WIRE/FFR STUDY;  Surgeon: Leonie Man, MD;  Location: [facility];  Service: Cardiovascular;  Laterality: N/A;  . INTRAVASCULAR ULTRASOUND/IVUS N/A 03/11/2017   Procedure: Intravascular Ultrasound/IVUS;  Surgeon: Leonie Man, MD;  Location: [facility];  Service: Cardiovascular;  Laterality: N/A;  . LEFT HEART CATH AND CORONARY ANGIOGRAPHY N/A 03/05/2017   Procedure: LEFT HEART CATH AND CORONARY ANGIOGRAPHY;  Surgeon: Isaias Cowman, MD;  Location: [facility];  Service: Cardiovascular;  Laterality: N/A;  . PACEMAKER IMPLANT    . pacemaker/defibrillator Left     Family Psychiatric History: I have reviewed family psychiatric history from my progress note on 01/01/2018.  Family History:  Family History  Problem Relation Age of Onset  . Hypertension Father   . Heart attack Father   . Depression Sister   . Depression Brother   . Depression Brother     Social History: I have reviewed social history from my progress note on 01/01/2018. Social History   Socioeconomic History  . Marital status: Married    Spouse name: rodney  . Number of children: 2  . Years of education: Not on file  . Highest education level: High school graduate  Occupational History  . Not on file  Social Needs  . Financial resource strain: Not hard at all  . Food insecurity    Worry: Never true    Inability: Never true  . Transportation needs    Medical: Yes    Non-medical: Yes  Tobacco Use  . Smoking status: Current Every Day Smoker    Packs/day: 0.25    Types: E-cigarettes, Cigarettes  . Smokeless tobacco: Never Used  Substance and Sexual Activity  . Alcohol use: Not Currently    Comment: occasionally  . Drug use: No  . Sexual activity: Not Currently  Lifestyle  . Physical activity    Days per week: 0 days    Minutes per session: 0 min  . Stress: Only a little  Relationships  . Social Herbalist on  phone: Three times a week    Gets together: More than three times a week    Attends religious service: Never    Active member of club or organization: No    Attends meetings of clubs or organizations: Never    Relationship status: Married  Other Topics Concern  . Not on file  Social History Narrative  . Not on file    Allergies:  Allergies  Allergen Reactions  . Celebrex [Celecoxib] Anaphylaxis  . Glipizide Anaphylaxis  . Sulfa Antibiotics Other (See Comments) and Anaphylaxis    Reaction: unknown  . Levaquin [Levofloxacin In D5w] Other (See Comments)    Heart arrhthymias  . Levofloxacin Other (See Comments)    ICD fired  . Metformin Other (See Comments)    Gi tolerance   . Penicillins Rash and Other (See Comments)    Has patient had a PCN reaction causing immediate rash, facial/tongue/throat swelling, SOB or lightheadedness with hypotension: Unknown Has patient had a PCN reaction causing severe rash involving  mucus membranes or skin necrosis: No Has patient had a PCN reaction that required hospitalization: No Has patient had a PCN reaction occurring within the last 10 years: No If all of the above answers are "NO", then may proceed with Cephalosporin use.     Metabolic Disorder Labs: No results found for: HGBA1C, MPG No results found for: PROLACTIN No results found for: CHOL, TRIG, HDL, CHOLHDL, VLDL, LDLCALC No results found for: TSH  Therapeutic Level Labs: No results found for: LITHIUM No results found for: VALPROATE No components found for:  CBMZ  Current Medications: Current Outpatient Medications  Medication Sig Dispense Refill  . acetaminophen (TYLENOL) 500 MG tablet Take 500 mg by mouth every 4 (four) hours as needed for mild pain, fever or headache.     . albuterol (PROVENTIL HFA;VENTOLIN HFA) 108 (90 Base) MCG/ACT inhaler Inhale 2 puffs into the lungs every 4 (four) hours as needed for wheezing or shortness of breath. 1 Inhaler 1  . aspirin EC 81 MG tablet  Take 81 mg by mouth daily.    . benzonatate (TESSALON PERLES) 100 MG capsule Take 1 capsule (100 mg total) by mouth 3 (three) times daily as needed for cough. 15 capsule 0  . clopidogrel (PLAVIX) 75 MG tablet Take 1 tablet (75 mg total) by mouth daily. 30 tablet 1  . doxycycline (VIBRAMYCIN) 100 MG capsule Take 1 capsule (100 mg total) by mouth 2 (two) times daily. 20 capsule 0  . Fluticasone-Salmeterol (ADVAIR DISKUS) 250-50 MCG/DOSE AEPB Inhale 1 puff into the lungs 2 (two) times daily. 1 each 1  . furosemide (LASIX) 40 MG tablet Take 40 mg by mouth daily as needed for fluid or edema.    Marland Kitchen lamoTRIgine (LAMICTAL) 25 MG tablet TAKE 3 TABLETS(75 MG) BY MOUTH DAILY 90 tablet 0  . levothyroxine (SYNTHROID) 50 MCG tablet     . levothyroxine (SYNTHROID, LEVOTHROID) 75 MCG tablet Take by mouth.    . metFORMIN (GLUCOPHAGE-XR) 750 MG 24 hr tablet Take 375 mg by mouth 2 (two) times daily.     . metoprolol succinate (TOPROL-XL) 25 MG 24 hr tablet TAKE 1/2 TABLET BY MOUTH DAILY WITH OR IMMEDIATELY FOLLOWING A MEAL 90 tablet 0  . mexiletine (MEXITIL) 200 MG capsule Take 1 capsule (200 mg total) by mouth every 12 (twelve) hours. 60 capsule 1  . Multiple Vitamin (MULTIVITAMIN WITH MINERALS) TABS tablet Take 1 tablet by mouth daily.    . nitroGLYCERIN (NITROSTAT) 0.4 MG SL tablet Place 0.4 mg under the tongue every 5 (five) minutes as needed for chest pain.    Marland Kitchen omega-3 acid ethyl esters (LOVAZA) 1 g capsule Take 2 g by mouth 2 (two) times daily.     . pioglitazone (ACTOS) 15 MG tablet Take 15 mg by mouth daily.    . potassium chloride SA (K-DUR,KLOR-CON) 20 MEQ tablet Take 1 tablet (20 mEq total) by mouth daily. (Patient taking differently: Take 20 mEq by mouth daily as needed (take along with lasix when needed). ) 30 tablet 1  . predniSONE (DELTASONE) 20 MG tablet Take 2 tablets 40 mg total by mouth daily for 3 days, then take 1 tablet 20 mg total by mouth daily for 2 days. 8 tablet 0  . ranolazine (RANEXA) 500  MG 12 hr tablet Take 1 tablet (500 mg total) by mouth 2 (two) times daily. 60 tablet 1  . simvastatin (ZOCOR) 40 MG tablet Take 20 mg by mouth at bedtime.   2   No current facility-administered  medications for this visit.      Musculoskeletal: Strength & Muscle Tone: UTA Gait & Station: UTA Patient leans: N/A  Psychiatric Specialty Exam: Review of Systems  Constitutional: Positive for malaise/fatigue.  Psychiatric/Behavioral: Positive for depression.  All other systems reviewed and are negative.   There were no vitals taken for this visit.There is no height or weight on file to calculate BMI.  General Appearance: UTA  Eye Contact:  UTA  Speech:  Clear and Coherent  Volume:  Normal  Mood:  Depressed  Affect:  UTA  Thought Process:  Goal Directed and Descriptions of Associations: Intact  Orientation:  Full (Time, Place, and Person)  Thought Content: Logical   Suicidal Thoughts:  No  Homicidal Thoughts:  No  Memory:  Immediate;   Fair Recent;   Fair Remote;   Fair  Judgement:  Fair  Insight:  Fair  Psychomotor Activity:  UTA  Concentration:  Concentration: Fair and Attention Span: Fair  Recall:  [employer] of Knowledge: Fair  Language: Fair  Akathisia:  No  Handed:  Right  AIMS (if indicated): denies tremors, rigidity  Assets:  Communication Skills Desire for Improvement Talents/Skills  ADL's:  Intact  Cognition: WNL  Sleep:  Excessive   Screenings:   Assessment and Plan: Dajha is a 78 year old Caucasian female, married, retired, lives in [place], has a history of medical problems including history of MI, status post stent placement, history of ICD placement, ischemic cardiomyopathy, history of drug-induced torsades de pointes, history of prolonged QT, history of V. fib, hypothyroidism, hypertension, mitral regurgitation, hyperlipidemia on portable oxygen, was evaluated by telemedicine today.  Patient was last seen 6 months ago.  Patient today reports she continues  to struggle with depressive symptoms.  Patient with noncompliance to treatment as well as recommendations.  Discussed the following plan with patient  Plan For MDD-unstable Taper of lamotrigine since she reports side effects. Advised patient to restart psychotherapy sessions. Discussed with patient that once she is completely off of the lamotrigine if she wants to try another new medication she can be started on it.  However she will need an EKG done prior to starting any new medication due to her history of QT prolongation. Patient also with TSH abnormalities which can contribute to some of the depressive symptoms.  This was discussed with patient.  She will continue to work with her endocrinologist.   For prolonged QT syndrome per history- we will continue to monitor closely. Order EKG today.  Tobacco use disorder-unstable Provided smoking cessation counseling.  Discussed referral back for therapy - she will call to schedule appointment.  Follow-up in clinic as needed.  Patient reports she will call back to schedule an appointment.  I have spent atleast 15 minutes non face to face with patient today. More than 50 % of the time was spent for psychoeducation and supportive psychotherapy and care coordination.  This note was generated in part or whole with voice recognition software. Voice recognition is usually quite accurate but there are transcription errors that can and very often do occur. I apologize for any typographical errors that were not detected and corrected.         Ursula Alert, MD 12/14/2018, 5:41 PM

## 2019-02-15 ENCOUNTER — Other Ambulatory Visit: Payer: Self-pay | Admitting: Gastroenterology

## 2019-02-15 DIAGNOSIS — R1310 Dysphagia, unspecified: Secondary | ICD-10-CM

## 2019-02-22 ENCOUNTER — Ambulatory Visit
Admission: RE | Admit: 2019-02-22 | Discharge: 2019-02-22 | Disposition: A | Discharge status: Home / Self Care | Payer: Medicare Other | Source: Ambulatory Visit | Attending: Gastroenterology | Admitting: Gastroenterology

## 2019-02-22 ENCOUNTER — Other Ambulatory Visit: Payer: Self-pay

## 2019-02-22 ENCOUNTER — Other Ambulatory Visit: Payer: Self-pay | Admitting: Gastroenterology

## 2019-02-22 DIAGNOSIS — R1310 Dysphagia, unspecified: Secondary | ICD-10-CM

## 2019-02-24 ENCOUNTER — Ambulatory Visit: Payer: Medicare Other

## 2019-02-25 ENCOUNTER — Other Ambulatory Visit
Admission: RE | Admit: 2019-02-25 | Discharge: 2019-02-25 | Disposition: A | Discharge status: Home / Self Care | Payer: Medicare Other | Source: Ambulatory Visit | Attending: Gastroenterology | Admitting: Gastroenterology

## 2019-02-25 DIAGNOSIS — R197 Diarrhea, unspecified: Secondary | ICD-10-CM | POA: Diagnosis present

## 2019-02-25 LAB — C DIFFICILE QUICK SCREEN W PCR REFLEX
C Diff antigen: NEGATIVE
C Diff interpretation: NOT DETECTED
C Diff toxin: NEGATIVE

## 2019-03-01 ENCOUNTER — Other Ambulatory Visit: Payer: Self-pay | Admitting: Gastroenterology

## 2019-03-01 DIAGNOSIS — R1031 Right lower quadrant pain: Secondary | ICD-10-CM

## 2019-03-01 DIAGNOSIS — R1032 Left lower quadrant pain: Secondary | ICD-10-CM

## 2019-03-02 ENCOUNTER — Other Ambulatory Visit: Payer: Self-pay

## 2019-03-02 ENCOUNTER — Ambulatory Visit
Admission: RE | Admit: 2019-03-02 | Discharge: 2019-03-02 | Disposition: A | Discharge status: Home / Self Care | Payer: Medicare Other | Source: Ambulatory Visit | Attending: Gastroenterology | Admitting: Gastroenterology

## 2019-03-02 DIAGNOSIS — R1031 Right lower quadrant pain: Secondary | ICD-10-CM | POA: Diagnosis not present

## 2019-03-02 DIAGNOSIS — R1032 Left lower quadrant pain: Secondary | ICD-10-CM | POA: Insufficient documentation

## 2019-07-02 ENCOUNTER — Emergency Department: Payer: Medicare Other

## 2019-07-02 ENCOUNTER — Other Ambulatory Visit: Payer: Self-pay

## 2019-07-02 ENCOUNTER — Inpatient Hospital Stay
Admission: EM | Admit: 2019-07-02 | Discharge: 2019-07-05 | DRG: 682 | Disposition: A | Discharge status: Home Health | Payer: Medicare Other | Attending: Internal Medicine | Admitting: Internal Medicine

## 2019-07-02 DIAGNOSIS — Z20822 Contact with and (suspected) exposure to covid-19: Secondary | ICD-10-CM | POA: Diagnosis present

## 2019-07-02 DIAGNOSIS — K7460 Unspecified cirrhosis of liver: Secondary | ICD-10-CM | POA: Diagnosis present

## 2019-07-02 DIAGNOSIS — E1122 Type 2 diabetes mellitus with diabetic chronic kidney disease: Secondary | ICD-10-CM | POA: Diagnosis present

## 2019-07-02 DIAGNOSIS — K224 Dyskinesia of esophagus: Secondary | ICD-10-CM | POA: Diagnosis present

## 2019-07-02 DIAGNOSIS — N179 Acute kidney failure, unspecified: Secondary | ICD-10-CM | POA: Diagnosis present

## 2019-07-02 DIAGNOSIS — F17210 Nicotine dependence, cigarettes, uncomplicated: Secondary | ICD-10-CM | POA: Diagnosis present

## 2019-07-02 DIAGNOSIS — E871 Hypo-osmolality and hyponatremia: Secondary | ICD-10-CM

## 2019-07-02 DIAGNOSIS — K219 Gastro-esophageal reflux disease without esophagitis: Secondary | ICD-10-CM | POA: Diagnosis present

## 2019-07-02 DIAGNOSIS — K649 Unspecified hemorrhoids: Secondary | ICD-10-CM | POA: Diagnosis present

## 2019-07-02 DIAGNOSIS — E1165 Type 2 diabetes mellitus with hyperglycemia: Secondary | ICD-10-CM | POA: Diagnosis present

## 2019-07-02 DIAGNOSIS — K589 Irritable bowel syndrome without diarrhea: Secondary | ICD-10-CM | POA: Diagnosis present

## 2019-07-02 DIAGNOSIS — J449 Chronic obstructive pulmonary disease, unspecified: Secondary | ICD-10-CM | POA: Diagnosis present

## 2019-07-02 DIAGNOSIS — F419 Anxiety disorder, unspecified: Secondary | ICD-10-CM | POA: Diagnosis present

## 2019-07-02 DIAGNOSIS — Z794 Long term (current) use of insulin: Secondary | ICD-10-CM | POA: Diagnosis not present

## 2019-07-02 DIAGNOSIS — Z888 Allergy status to other drugs, medicaments and biological substances status: Secondary | ICD-10-CM

## 2019-07-02 DIAGNOSIS — I959 Hypotension, unspecified: Secondary | ICD-10-CM | POA: Diagnosis present

## 2019-07-02 DIAGNOSIS — I130 Hypertensive heart and chronic kidney disease with heart failure and stage 1 through stage 4 chronic kidney disease, or unspecified chronic kidney disease: Secondary | ICD-10-CM | POA: Diagnosis present

## 2019-07-02 DIAGNOSIS — Z95810 Presence of automatic (implantable) cardiac defibrillator: Secondary | ICD-10-CM

## 2019-07-02 DIAGNOSIS — Z7902 Long term (current) use of antithrombotics/antiplatelets: Secondary | ICD-10-CM

## 2019-07-02 DIAGNOSIS — I252 Old myocardial infarction: Secondary | ICD-10-CM

## 2019-07-02 DIAGNOSIS — Z8249 Family history of ischemic heart disease and other diseases of the circulatory system: Secondary | ICD-10-CM

## 2019-07-02 DIAGNOSIS — I5042 Chronic combined systolic (congestive) and diastolic (congestive) heart failure: Secondary | ICD-10-CM | POA: Diagnosis not present

## 2019-07-02 DIAGNOSIS — I25118 Atherosclerotic heart disease of native coronary artery with other forms of angina pectoris: Secondary | ICD-10-CM | POA: Diagnosis present

## 2019-07-02 DIAGNOSIS — E669 Obesity, unspecified: Secondary | ICD-10-CM | POA: Diagnosis present

## 2019-07-02 DIAGNOSIS — F329 Major depressive disorder, single episode, unspecified: Secondary | ICD-10-CM | POA: Diagnosis present

## 2019-07-02 DIAGNOSIS — Z6833 Body mass index (BMI) 33.0-33.9, adult: Secondary | ICD-10-CM

## 2019-07-02 DIAGNOSIS — D631 Anemia in chronic kidney disease: Secondary | ICD-10-CM | POA: Diagnosis present

## 2019-07-02 DIAGNOSIS — N1832 Chronic kidney disease, stage 3b: Secondary | ICD-10-CM | POA: Diagnosis present

## 2019-07-02 DIAGNOSIS — Z8679 Personal history of other diseases of the circulatory system: Secondary | ICD-10-CM

## 2019-07-02 DIAGNOSIS — Z79899 Other long term (current) drug therapy: Secondary | ICD-10-CM

## 2019-07-02 DIAGNOSIS — R531 Weakness: Secondary | ICD-10-CM | POA: Diagnosis present

## 2019-07-02 DIAGNOSIS — D649 Anemia, unspecified: Secondary | ICD-10-CM | POA: Diagnosis not present

## 2019-07-02 DIAGNOSIS — E860 Dehydration: Secondary | ICD-10-CM

## 2019-07-02 DIAGNOSIS — Z882 Allergy status to sulfonamides status: Secondary | ICD-10-CM

## 2019-07-02 DIAGNOSIS — I5022 Chronic systolic (congestive) heart failure: Secondary | ICD-10-CM

## 2019-07-02 DIAGNOSIS — Z818 Family history of other mental and behavioral disorders: Secondary | ICD-10-CM

## 2019-07-02 DIAGNOSIS — J9611 Chronic respiratory failure with hypoxia: Secondary | ICD-10-CM

## 2019-07-02 DIAGNOSIS — Z886 Allergy status to analgesic agent status: Secondary | ICD-10-CM

## 2019-07-02 DIAGNOSIS — Z9981 Dependence on supplemental oxygen: Secondary | ICD-10-CM

## 2019-07-02 DIAGNOSIS — I4901 Ventricular fibrillation: Secondary | ICD-10-CM | POA: Diagnosis present

## 2019-07-02 DIAGNOSIS — E119 Type 2 diabetes mellitus without complications: Secondary | ICD-10-CM | POA: Diagnosis not present

## 2019-07-02 DIAGNOSIS — R9431 Abnormal electrocardiogram [ECG] [EKG]: Secondary | ICD-10-CM | POA: Diagnosis present

## 2019-07-02 DIAGNOSIS — Z79890 Hormone replacement therapy (postmenopausal): Secondary | ICD-10-CM

## 2019-07-02 DIAGNOSIS — Z7982 Long term (current) use of aspirin: Secondary | ICD-10-CM

## 2019-07-02 DIAGNOSIS — N189 Chronic kidney disease, unspecified: Secondary | ICD-10-CM

## 2019-07-02 DIAGNOSIS — E875 Hyperkalemia: Secondary | ICD-10-CM | POA: Diagnosis present

## 2019-07-02 DIAGNOSIS — E538 Deficiency of other specified B group vitamins: Secondary | ICD-10-CM | POA: Diagnosis present

## 2019-07-02 DIAGNOSIS — K760 Fatty (change of) liver, not elsewhere classified: Secondary | ICD-10-CM | POA: Diagnosis present

## 2019-07-02 DIAGNOSIS — N183 Chronic kidney disease, stage 3 unspecified: Secondary | ICD-10-CM | POA: Diagnosis present

## 2019-07-02 DIAGNOSIS — I5043 Acute on chronic combined systolic (congestive) and diastolic (congestive) heart failure: Secondary | ICD-10-CM | POA: Diagnosis present

## 2019-07-02 DIAGNOSIS — I429 Cardiomyopathy, unspecified: Secondary | ICD-10-CM | POA: Diagnosis present

## 2019-07-02 DIAGNOSIS — K5730 Diverticulosis of large intestine without perforation or abscess without bleeding: Secondary | ICD-10-CM | POA: Diagnosis present

## 2019-07-02 DIAGNOSIS — E878 Other disorders of electrolyte and fluid balance, not elsewhere classified: Secondary | ICD-10-CM | POA: Diagnosis present

## 2019-07-02 DIAGNOSIS — Z7984 Long term (current) use of oral hypoglycemic drugs: Secondary | ICD-10-CM

## 2019-07-02 DIAGNOSIS — Z880 Allergy status to penicillin: Secondary | ICD-10-CM

## 2019-07-02 DIAGNOSIS — Z955 Presence of coronary angioplasty implant and graft: Secondary | ICD-10-CM

## 2019-07-02 DIAGNOSIS — D529 Folate deficiency anemia, unspecified: Secondary | ICD-10-CM | POA: Diagnosis not present

## 2019-07-02 DIAGNOSIS — Z881 Allergy status to other antibiotic agents status: Secondary | ICD-10-CM

## 2019-07-02 DIAGNOSIS — D509 Iron deficiency anemia, unspecified: Secondary | ICD-10-CM | POA: Diagnosis not present

## 2019-07-02 HISTORY — DX: Abnormal electrocardiogram (ECG) (EKG): R94.31

## 2019-07-02 LAB — HEMOGLOBIN A1C
Hgb A1c MFr Bld: 7.2 % — ABNORMAL HIGH (ref 4.8–5.6)
Mean Plasma Glucose: 159.94 mg/dL

## 2019-07-02 LAB — URINALYSIS, COMPLETE (UACMP) WITH MICROSCOPIC
Bacteria, UA: NONE SEEN
Bilirubin Urine: NEGATIVE
Glucose, UA: NEGATIVE mg/dL
Hgb urine dipstick: NEGATIVE
Ketones, ur: NEGATIVE mg/dL
Nitrite: NEGATIVE
Protein, ur: NEGATIVE mg/dL
Specific Gravity, Urine: 1.015 (ref 1.005–1.030)
pH: 5 (ref 5.0–8.0)

## 2019-07-02 LAB — BASIC METABOLIC PANEL
Anion gap: 11 (ref 5–15)
BUN: 45 mg/dL — ABNORMAL HIGH (ref 8–23)
CO2: 25 mmol/L (ref 22–32)
Calcium: 8.7 mg/dL — ABNORMAL LOW (ref 8.9–10.3)
Chloride: 95 mmol/L — ABNORMAL LOW (ref 98–111)
Creatinine, Ser: 2.42 mg/dL — ABNORMAL HIGH (ref 0.44–1.00)
GFR calc Af Amer: 22 mL/min — ABNORMAL LOW (ref >60)
GFR calc non Af Amer: 19 mL/min — ABNORMAL LOW (ref >60)
Glucose, Bld: 158 mg/dL — ABNORMAL HIGH (ref 70–99)
Potassium: 5.2 mmol/L — ABNORMAL HIGH (ref 3.5–5.1)
Sodium: 131 mmol/L — ABNORMAL LOW (ref 135–145)

## 2019-07-02 LAB — CBC WITH DIFFERENTIAL/PLATELET
Abs Immature Granulocytes: 0.09 10*3/uL — ABNORMAL HIGH (ref 0.00–0.07)
Basophils Absolute: 0 10*3/uL (ref 0.0–0.1)
Basophils Relative: 1 %
Eosinophils Absolute: 0.1 10*3/uL (ref 0.0–0.5)
Eosinophils Relative: 2 %
HCT: 22.5 % — ABNORMAL LOW (ref 36.0–46.0)
Hemoglobin: 7.1 g/dL — ABNORMAL LOW (ref 12.0–15.0)
Immature Granulocytes: 1 %
Lymphocytes Relative: 23 %
Lymphs Abs: 1.8 10*3/uL (ref 0.7–4.0)
MCH: 27.8 pg (ref 26.0–34.0)
MCHC: 31.6 g/dL (ref 30.0–36.0)
MCV: 88.2 fL (ref 80.0–100.0)
Monocytes Absolute: 1 10*3/uL (ref 0.1–1.0)
Monocytes Relative: 12 %
Neutro Abs: 4.9 10*3/uL (ref 1.7–7.7)
Neutrophils Relative %: 61 %
Platelets: 214 10*3/uL (ref 150–400)
RBC: 2.55 MIL/uL — ABNORMAL LOW (ref 3.87–5.11)
RDW: 19.4 % — ABNORMAL HIGH (ref 11.5–15.5)
WBC: 7.9 10*3/uL (ref 4.0–10.5)
nRBC: 0.4 % — ABNORMAL HIGH (ref 0.0–0.2)

## 2019-07-02 LAB — RETICULOCYTES
Immature Retic Fract: 34.3 % — ABNORMAL HIGH (ref 2.3–15.9)
RBC.: 2.7 MIL/uL — ABNORMAL LOW (ref 3.87–5.11)
Retic Count, Absolute: 96.1 10*3/uL (ref 19.0–186.0)
Retic Ct Pct: 3.6 % — ABNORMAL HIGH (ref 0.4–3.1)

## 2019-07-02 LAB — FERRITIN: Ferritin: 27 ng/mL (ref 11–307)

## 2019-07-02 LAB — IRON AND TIBC
Iron: 70 ug/dL (ref 28–170)
Saturation Ratios: 16 % (ref 10.4–31.8)
TIBC: 435 ug/dL (ref 250–450)
UIBC: 365 ug/dL

## 2019-07-02 LAB — PREPARE RBC (CROSSMATCH)

## 2019-07-02 LAB — GLUCOSE, CAPILLARY
Glucose-Capillary: 157 mg/dL — ABNORMAL HIGH (ref 70–99)
Glucose-Capillary: 137 mg/dL — ABNORMAL HIGH (ref 70–99)
Glucose-Capillary: 133 mg/dL — ABNORMAL HIGH (ref 70–99)
Glucose-Capillary: 135 mg/dL — ABNORMAL HIGH (ref 70–99)

## 2019-07-02 LAB — TROPONIN I (HIGH SENSITIVITY)
Troponin I (High Sensitivity): 15 ng/L (ref <18)
Troponin I (High Sensitivity): 14 ng/L (ref <18)

## 2019-07-02 LAB — FOLATE: Folate: 5.1 ng/mL — ABNORMAL LOW (ref >5.9)

## 2019-07-02 LAB — ABO/RH: ABO/RH(D): B POS

## 2019-07-02 LAB — VITAMIN B12: Vitamin B-12: 360 pg/mL (ref 180–914)

## 2019-07-02 LAB — SARS CORONAVIRUS 2 (TAT 6-24 HRS): SARS Coronavirus 2: NEGATIVE

## 2019-07-02 MED ORDER — ALBUTEROL SULFATE (2.5 MG/3ML) 0.083% IN NEBU
2.5000 mg | INHALATION_SOLUTION | RESPIRATORY_TRACT | Status: DC | PRN
  Administered 2019-07-02 – 2019-07-04 (×5): 2.5 mg via RESPIRATORY_TRACT
  Filled 2019-07-02 (×5): qty 3

## 2019-07-02 MED ORDER — MEXILETINE HCL 200 MG PO CAPS
200.0000 mg | ORAL_CAPSULE | Freq: Two times a day (BID) | ORAL | Status: DC
  Administered 2019-07-02 – 2019-07-05 (×7): 200 mg via ORAL
  Filled 2019-07-02 (×9): qty 1

## 2019-07-02 MED ORDER — SENNA 8.6 MG PO TABS
1.0000 | ORAL_TABLET | Freq: Two times a day (BID) | ORAL | Status: DC
  Administered 2019-07-02 – 2019-07-03 (×3): 8.6 mg via ORAL
  Filled 2019-07-02 (×6): qty 1

## 2019-07-02 MED ORDER — SODIUM CHLORIDE 0.9 % IV BOLUS
500.0000 mL | Freq: Once | INTRAVENOUS | Status: AC
  Administered 2019-07-02: 500 mL via INTRAVENOUS

## 2019-07-02 MED ORDER — ACETAMINOPHEN 325 MG PO TABS
650.0000 mg | ORAL_TABLET | Freq: Four times a day (QID) | ORAL | Status: DC | PRN
  Administered 2019-07-03: 650 mg via ORAL
  Filled 2019-07-02: qty 2

## 2019-07-02 MED ORDER — RANOLAZINE ER 500 MG PO TB12
500.0000 mg | ORAL_TABLET | Freq: Two times a day (BID) | ORAL | Status: DC
  Administered 2019-07-02 – 2019-07-05 (×7): 500 mg via ORAL
  Filled 2019-07-02 (×9): qty 1

## 2019-07-02 MED ORDER — SIMVASTATIN 20 MG PO TABS
20.0000 mg | ORAL_TABLET | Freq: Every day | ORAL | Status: DC
  Administered 2019-07-02 – 2019-07-04 (×3): 20 mg via ORAL
  Filled 2019-07-02 (×3): qty 1

## 2019-07-02 MED ORDER — LEVOTHYROXINE SODIUM 50 MCG PO TABS: 50.0000 ug | ORAL_TABLET | ORAL | Status: DC

## 2019-07-02 MED ORDER — INSULIN ASPART 100 UNIT/ML ~~LOC~~ SOLN
0.0000 [IU] | Freq: Three times a day (TID) | SUBCUTANEOUS | Status: DC
  Administered 2019-07-02: 2 [IU] via SUBCUTANEOUS
  Administered 2019-07-02: 1 [IU] via SUBCUTANEOUS
  Administered 2019-07-03: 2 [IU] via SUBCUTANEOUS
  Filled 2019-07-02 (×3): qty 1

## 2019-07-02 MED ORDER — PANTOPRAZOLE SODIUM 40 MG IV SOLR
40.0000 mg | Freq: Two times a day (BID) | INTRAVENOUS | Status: DC
  Administered 2019-07-02 – 2019-07-05 (×7): 40 mg via INTRAVENOUS
  Filled 2019-07-02 (×7): qty 40

## 2019-07-02 MED ORDER — SODIUM CHLORIDE 0.9 % IV SOLN: INTRAVENOUS | Status: AC

## 2019-07-02 MED ORDER — FLUOXETINE HCL 10 MG PO CAPS
10.0000 mg | ORAL_CAPSULE | Freq: Every day | ORAL | Status: DC
  Administered 2019-07-02 – 2019-07-03 (×2): 10 mg via ORAL
  Filled 2019-07-02 (×2): qty 1

## 2019-07-02 MED ORDER — ACETAMINOPHEN 650 MG RE SUPP: 650.0000 mg | Freq: Four times a day (QID) | RECTAL | Status: DC | PRN

## 2019-07-02 MED ORDER — POLYETHYLENE GLYCOL 3350 17 G PO PACK
17.0000 g | PACK | Freq: Every day | ORAL | Status: DC | PRN
  Filled 2019-07-02: qty 1

## 2019-07-02 MED ORDER — MELATONIN 5 MG PO TABS
10.0000 mg | ORAL_TABLET | Freq: Once | ORAL | Status: AC
  Administered 2019-07-03: 10 mg via ORAL
  Filled 2019-07-02: qty 2

## 2019-07-02 MED ORDER — SODIUM CHLORIDE 0.9 % IV SOLN: 10.0000 mL/h | Freq: Once | INTRAVENOUS | Status: DC

## 2019-07-02 MED ORDER — INSULIN ASPART 100 UNIT/ML ~~LOC~~ SOLN: 0.0000 [IU] | Freq: Every day | SUBCUTANEOUS | Status: DC

## 2019-07-02 MED ORDER — LEVOTHYROXINE SODIUM 50 MCG PO TABS
75.0000 ug | ORAL_TABLET | ORAL | Status: DC
  Filled 2019-07-02: qty 1

## 2019-07-02 NOTE — ED Notes (Signed)
This RN at bedside. PT systolic BP in 17V. Pt BP cuff repositioned and BP retaken. Pt A&O and NAD noted. Fluids continue to infuse. Pt RN made aware.

## 2019-07-02 NOTE — Consult Note (Signed)
[name] , MD [address], [place], [place], [place], [id number] [address] [place], [place], [place], [address] Phone: [phone]  Fax: [phone]  Consultation  Referring Provider:  Dr Posey Pronto Primary Care Physician:  [name], MD Primary Gastroenterologist:  [facility]          Reason for Consultation:     Anemia   Date of Admission:  07/02/2019 Date of Consultation:  07/02/2019         HPI:   Heather Peterson is a 79 y.o. female presented to the emergency room earlier today with constipation, decreased appetite and poor energy.  She has a history of congestive heart failure and coronary artery disease.  Ventricular fibrillation and tachycardia status post ICD placement.  CKD on home oxygen.  She states that she has a history of hemorrhoids which bleeds occasionally.  She is on Plavix as well as aspirin.  She is a patient of [facility].  Last seen in November 2020.  She carries a diagnosis of irritable bowel syndrome.  At that point she had dysphagia and diarrhea.  CT scan of the abdomen in October 2020 showed colonic diverticulosis mild hepatic steatosis and possible cirrhosis.  Barium swallow showed mild GERD and dysmotility of the esophagus.  Chest x-ray on admission for shortness of broth showed probable mild pulmonary edema.  She is known to have cirrhosis and had a few tests performed to evaluate for the same.  In January 2021 hemoglobin was 9.1 g when checked at [facility].  She is being treated for acute on chronic renal failure and hypotension due to poor oral intake and dehydration.  On admission hemoglobin 7.1 g with an MCV of 88.2.  1 year back hemoglobin was 9.9 g.  Creatinine has worsened since 1 year where the creatinine was 1.47 and presently is 2.42.  B12 360 iron studies are completely normal except ferritin which is low at 27.   She denies any overt blood loss.  She states that she was told she has hemorrhoids  in the past.  Last colonoscopy was back in 2017 where she had some hemorrhoids.  Was done on Friday.  She is on Plavix and dose was taken yesterday along with her aspirin.  Denies any hematemesis, nasal bleeds, blood in the urine.  Denies any NSAID use. Past Medical History:  Diagnosis Date  . Anxiety   . Chronic combined systolic and diastolic CHF (congestive heart failure) ([place])   . Coronary artery disease   . Depression   . Diabetes mellitus without complication ([place])   . Diabetes mellitus, type II ([place])   . Hypertension   . MI (myocardial infarction) ([place])    x 5  . Pacemaker   . Thyroid disease     Past Surgical History:  Procedure Laterality Date  . CENTRAL LINE INSERTION  03/11/2017   Procedure: CENTRAL LINE INSERTION;  Surgeon: Leonie Man, MD;  Location: [place] [place] [facility];  Service: Cardiovascular;;  . CHOLECYSTECTOMY    . CORONARY STENT INTERVENTION W/IMPELLA N/A 03/11/2017   Procedure: Coronary Stent Intervention w/Impella;  Surgeon: Leonie Man, MD;  Location: [facility];  Service: Cardiovascular;  Laterality: N/A;  . EYE SURGERY    . INTRAVASCULAR PRESSURE WIRE/FFR STUDY N/A 03/11/2017   Procedure: INTRAVASCULAR PRESSURE WIRE/FFR STUDY;  Surgeon: Leonie Man, MD;  Location: [facility];  Service: Cardiovascular;  Laterality: N/A;  . INTRAVASCULAR ULTRASOUND/IVUS N/A 03/11/2017  Procedure: Intravascular Ultrasound/IVUS;  Surgeon: Leonie Man, MD;  Location: [facility];  Service: Cardiovascular;  Laterality: N/A;  . LEFT HEART CATH AND CORONARY ANGIOGRAPHY N/A 03/05/2017   Procedure: LEFT HEART CATH AND CORONARY ANGIOGRAPHY;  Surgeon: Isaias Cowman, MD;  Location: [facility];  Service: Cardiovascular;  Laterality: N/A;  . PACEMAKER IMPLANT    . pacemaker/defibrillator Left     Prior to Admission medications   Medication Sig Start Date End Date Taking? Authorizing Provider  acetaminophen (TYLENOL) 500 MG  tablet Take 500 mg by mouth every 4 (four) hours as needed for mild pain, fever or headache.    Yes [provider]  albuterol (PROVENTIL HFA;VENTOLIN HFA) 108 (90 Base) MCG/ACT inhaler Inhale 2 puffs into the lungs every 4 (four) hours as needed for wheezing or shortness of breath. 02/05/17  Yes Gouru, Illene Silver, MD  aspirin EC 81 MG tablet Take 81 mg by mouth daily.   Yes [provider]  clopidogrel (PLAVIX) 75 MG tablet Take 1 tablet (75 mg total) by mouth daily. 03/14/17  Yes Seiler, Amber K, NP  FLUoxetine (PROZAC) 10 MG capsule Take 10 mg by mouth daily. 06/29/19  Yes [provider]  furosemide (LASIX) 40 MG tablet Take 40 mg by mouth daily.    Yes [provider]  LANTUS SOLOSTAR 100 UNIT/ML Solostar Pen Inject 16 Units into the skin at bedtime. 06/09/19  Yes [provider]  levothyroxine (SYNTHROID) 50 MCG tablet Take 50 mcg by mouth See admin instructions. Take 1 tablet (12mg) by mouth every Tuesday, Thursday, Saturday and Sunday before breakfast   Yes [provider]  levothyroxine (SYNTHROID, LEVOTHROID) 75 MCG tablet Take 75 mcg by mouth See admin instructions. Take 1 tablet (760m) by mouth every Monday, Wednesday and Friday morning before breakfast   Yes [provider]  metoprolol succinate (TOPROL-XL) 25 MG 24 hr tablet TAKE 1/2 TABLET BY MOUTH DAILY WITH OR IMMEDIATELY FOLLOWING A MEAL Patient taking differently: Take 12.5 mg by mouth daily.  12/14/18  Yes KlDeboraha SprangMD  mexiletine (MEXITIL) 200 MG capsule Take 1 capsule (200 mg total) by mouth every 12 (twelve) hours. 03/14/17  Yes Seiler, Amber K, NP  pantoprazole (PROTONIX) 40 MG tablet Take 40 mg by mouth daily. 05/16/19  Yes [provider]  potassium chloride SA (K-DUR,KLOR-CON) 20 MEQ tablet Take 1 tablet (20 mEq total) by mouth daily. 03/14/17  Yes Seiler, Amber K, NP  ranolazine (RANEXA) 500 MG 12 hr tablet Take 1 tablet (500 mg total) by mouth 2 (two) times  daily. 03/14/17  Yes Seiler, Amber K, NP  simvastatin (ZOCOR) 40 MG tablet Take 20 mg by mouth at bedtime.    Yes [provider]    Family History  Problem Relation Age of Onset  . Hypertension Father   . Heart attack Father   . Depression Sister   . Depression Brother   . Depression Brother      Social History   Tobacco Use  . Smoking status: Current Every Day Smoker    Packs/day: 0.25    Types: E-cigarettes, Cigarettes  . Smokeless tobacco: Never Used  Substance Use Topics  . Alcohol use: Not Currently    Comment: occasionally  . Drug use: No    Allergies as of 07/02/2019 - Review Complete 07/02/2019  Allergen Reaction Noted  . Celebrex [celecoxib] Anaphylaxis 02/04/2017  . Glipizide Anaphylaxis 08/03/2013  . Lisinopril Swelling 05/19/2019  . Sulfa antibiotics Other (See  Comments) and Anaphylaxis 08/03/2013  . Levaquin [levofloxacin in d5w] Other (See Comments) 02/04/2017  . Levofloxacin Other (See Comments) 02/10/2017  . Metformin Other (See Comments) 08/03/2013  . Penicillins Rash and Other (See Comments) 08/03/2013    Review of Systems:    All systems reviewed and negative except where noted in HPI.   Physical Exam:  Vital signs in last 24 hours: Temp:  [97.5 F (36.4 C)-97.8 F (36.6 C)] 97.7 F (36.5 C) (02/19 1126) Pulse Rate:  [68-71] 69 (02/19 1300) Resp:  [13-25] 20 (02/19 1300) BP: (83-111)/(51-91) 97/67 (02/19 1300) SpO2:  [94 %-100 %] 98 % (02/19 1300) Weight:  [81.6 kg] 81.6 kg (02/19 0548)   General:   Pleasant, cooperative in NAD Head:  Normocephalic and atraumatic. Eyes:   No icterus.   Conjunctiva pink. PERRLA. Ears:  Normal auditory acuity. Neck:  Supple; no masses or thyroidomegaly Lungs: Respirations even and unlabored. L decreased air entry bilaterally   no wheezes, crackles, or rhonchi.  Heart:  Regular rate and rhythm;  Without murmur, clicks, rubs or gallops Abdomen:  Soft, nondistended, nontender. Normal bowel sounds. No  appreciable masses or hepatomegaly.  No rebound or guarding.  Neurologic:  Alert and oriented x3;  grossly normal neurologically. Skin:  Intact without significant lesions or rashes. Cervical Nodes:  No significant cervical adenopathy. Psych:  Alert and cooperative. Normal affect.  LAB RESULTS: Recent Labs    07/02/19 0555  WBC 7.9  HGB 7.1*  HCT 22.5*  PLT 214   BMET Recent Labs    07/02/19 0555  NA 131*  K 5.2*  CL 95*  CO2 25  GLUCOSE 158*  BUN 45*  CREATININE 2.42*  CALCIUM 8.7*   LFT No results for input(s): PROT, ALBUMIN, AST, ALT, ALKPHOS, BILITOT, BILIDIR, IBILI in the last 72 hours. PT/INR No results for input(s): LABPROT, INR in the last 72 hours.  STUDIES: DG Chest 1 View  Result Date: 07/02/2019 CLINICAL DATA:  Shortness of breath. Increasing weakness. EXAM: CHEST  1 VIEW COMPARISON:  Radiograph 03/15/2018. FINDINGS: Multi lead left-sided pacemaker. Cardiomegaly with unchanged mediastinal contours. Peribronchial thickening with question of minimal Kerley B-lines at the bases. No evidence of pneumonia. No large pleural effusion. No pneumothorax. IMPRESSION: Stable cardiomegaly.  Probable mild pulmonary edema. Electronically Signed   By: Keith Rake M.D.   On: 07/02/2019 06:09      Impression / Plan:   Heather Peterson is a 79 y.o. y/o female who is a patient of [facility] last seen at their office in January 2021.  She carries a diagnosis of cirrhosis and irritable bowel syndrome.  I have been consulted for lower than baseline hemoglobin.  Iron studies show a lower than baseline a ferritin of in the 20s.  No overt blood loss.  She has multiple cardiac risk factors.  She is on aspirin and Plavix.  Barium swallow performed recently showed no gross abnormalities.  At this point of time I would not suggest any urgent endoscopic evaluation due to the risks of doing so as she is having some pulmonary edema.  In addition any endoscopy  treatment would require her Plavix to be held for 5 days.  If not having any gross overt bleeding I would suggest to give her a dose of IV iron and possibly even consider EPO if felt reasonable since she has chronic kidney disease and a part of her anemia may be due to anemia of chronic disease.  She should follow-up with her  primary gastroenterologist at [facility] clinic to determine timing of endoscopic evaluation for anemia after cardiac clearance as an outpatient.  Thank you for involving me in the care of this patient.      LOS: 0 days   [name], MD  07/02/2019, 2:15 PM

## 2019-07-02 NOTE — H&P (Addendum)
[facility] NAME: Heather Peterson    MR#:  [id number]  DATE OF BIRTH:  [birth year]  DATE OF ADMISSION:  07/02/2019  PRIMARY CARE PHYSICIAN: [name], MD   REQUESTING/REFERRING PHYSICIAN: Dr. Quentin Cornwall  Patient coming from : home   CHIEF COMPLAINT:  generalized weakness, decreased energy, poor appetite and constipation 3-4 days  HISTORY OF PRESENT ILLNESS:  Heather Peterson  is a 79 y.o. 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 Patient describes poor appetite, constipation for three days and some abdominal discomfort. She has history of hemorrhoids occasionally bleeds denies any blood clots vomiting diarrhea or any fever. She denies any recent travel.  ED course: in the ER patient was found to be hypotensive 91/52, sats 99% on 2 L with BUN of 45 creatinine of 2.5 (baseline creatinine 1.8 in jan 2021) she was found to have hemoglobin of 7.1 (Hgb 9.9 in jan 2021--[facility] labs) Faintly guaiac positive. Patient does not recall having any G.I. evaluation in the past. She has history of hemorrhoids according to her occasionally bleeds mildly.  Patient is being admitted with acute on chronic kidney disease stage IIIb with dehydration, hypotension and anemia acute on chronic r/o GI bleed. PAST MEDICAL HISTORY:   Past Medical History:  Diagnosis Date  . Anxiety   . Chronic combined systolic and diastolic CHF (congestive heart failure) ([place])   . Coronary artery disease   . Depression   . Diabetes mellitus without complication ([place])   . Diabetes  mellitus, type II ([place])   . Hypertension   . MI (myocardial infarction) ([place])    x 5  . Pacemaker   . Thyroid disease     PAST SURGICAL HISTOIRY:   Past Surgical History:  Procedure Laterality Date  . CENTRAL LINE INSERTION  03/11/2017   Procedure: CENTRAL LINE INSERTION;  Surgeon: Leonie Man, MD;  Location: [facility];  Service: Cardiovascular;;  . CHOLECYSTECTOMY    . CORONARY STENT INTERVENTION W/IMPELLA N/A 03/11/2017   Procedure: Coronary Stent Intervention w/Impella;  Surgeon: Leonie Man, MD;  Location: [facility];  Service: Cardiovascular;  Laterality: N/A;  . EYE SURGERY    . INTRAVASCULAR PRESSURE WIRE/FFR STUDY N/A 03/11/2017   Procedure: INTRAVASCULAR PRESSURE WIRE/FFR STUDY;  Surgeon: Leonie Man, MD;  Location: [facility];  Service: Cardiovascular;  Laterality: N/A;  . INTRAVASCULAR ULTRASOUND/IVUS N/A 03/11/2017   Procedure: Intravascular Ultrasound/IVUS;  Surgeon: Leonie Man, MD;  Location: [facility];  Service: Cardiovascular;  Laterality: N/A;  . LEFT HEART CATH AND CORONARY ANGIOGRAPHY N/A 03/05/2017   Procedure: LEFT HEART CATH AND CORONARY ANGIOGRAPHY;  Surgeon: Isaias Cowman, MD;  Location: [facility];  Service: Cardiovascular;  Laterality: N/A;  . PACEMAKER IMPLANT    . pacemaker/defibrillator Left     SOCIAL HISTORY:   Social History   Tobacco Use  . Smoking status: Current Every Day Smoker    Packs/day: 0.25    Types: E-cigarettes, Cigarettes  . Smokeless tobacco: Never Used  Substance Use Topics  . Alcohol use: Not Currently  Comment: occasionally    FAMILY HISTORY:   Family History  Problem Relation Age of Onset  . Hypertension Father   . Heart attack Father   . Depression Sister   . Depression Brother   . Depression Brother     DRUG ALLERGIES:   Allergies  Allergen Reactions  . Celebrex [Celecoxib] Anaphylaxis  . Glipizide Anaphylaxis  . Sulfa Antibiotics  Other (See Comments) and Anaphylaxis    Reaction: unknown  . Levaquin [Levofloxacin In D5w] Other (See Comments)    Heart arrhthymias  . Levofloxacin Other (See Comments)    ICD fired  . Metformin Other (See Comments)    Gi tolerance   . Penicillins Rash and Other (See Comments)    Has patient had a PCN reaction causing immediate rash, facial/tongue/throat swelling, SOB or lightheadedness with hypotension: Unknown Has patient had a PCN reaction causing severe rash involving mucus membranes or skin necrosis: No Has patient had a PCN reaction that required hospitalization: No Has patient had a PCN reaction occurring within the last 10 years: No If all of the above answers are "NO", then may proceed with Cephalosporin use.     REVIEW OF SYSTEMS:  Review of Systems  Constitutional: Positive for malaise/fatigue. Negative for chills, fever and weight loss.  HENT: Negative for ear discharge, ear pain and nosebleeds.   Eyes: Negative for blurred vision, pain and discharge.  Respiratory: Positive for shortness of breath. Negative for sputum production, wheezing and stridor.   Cardiovascular: Negative for chest pain, palpitations, orthopnea and PND.  Gastrointestinal: Positive for blood in stool and constipation. Negative for abdominal pain, diarrhea, nausea and vomiting.  Genitourinary: Negative for frequency and urgency.  Musculoskeletal: Positive for joint pain. Negative for back pain.  Neurological: Positive for weakness. Negative for sensory change, speech change and focal weakness.  Psychiatric/Behavioral: Negative for depression and hallucinations. The patient is not nervous/anxious.      MEDICATIONS AT HOME:   Prior to Admission medications   Medication Sig Start Date End Date Taking? Authorizing Provider  mexiletine (MEXITIL) 200 MG capsule Take 1 capsule (200 mg total) by mouth every 12 (twelve) hours. 03/14/17  Yes Seiler, Amber K, NP  simvastatin (ZOCOR) 40 MG tablet Take 20 mg  by mouth at bedtime.    Yes [provider]  acetaminophen (TYLENOL) 500 MG tablet Take 500 mg by mouth every 4 (four) hours as needed for mild pain, fever or headache.     [provider]  albuterol (PROVENTIL HFA;VENTOLIN HFA) 108 (90 Base) MCG/ACT inhaler Inhale 2 puffs into the lungs every 4 (four) hours as needed for wheezing or shortness of breath. 02/05/17   Nicholes Mango, MD  aspirin EC 81 MG tablet Take 81 mg by mouth daily.    [provider]  clopidogrel (PLAVIX) 75 MG tablet Take 1 tablet (75 mg total) by mouth daily. 03/14/17   Patsey Berthold, NP  furosemide (LASIX) 40 MG tablet Take 40 mg by mouth daily as needed for fluid or edema.    [provider]  lamoTRIgine (LAMICTAL) 25 MG tablet TAKE 3 TABLETS(75 MG) BY MOUTH DAILY 12/14/18   Ursula Alert, MD  levothyroxine (SYNTHROID) 50 MCG tablet  11/27/18   [provider]  levothyroxine (SYNTHROID, LEVOTHROID) 75 MCG tablet Take by mouth. 02/18/18 02/18/19  [provider]  metFORMIN (GLUCOPHAGE-XR) 750 MG 24 hr tablet Take 375 mg by mouth 2 (two) times daily.     [provider]  metoprolol succinate (TOPROL-XL) 25  MG 24 hr tablet TAKE 1/2 TABLET BY MOUTH DAILY WITH OR IMMEDIATELY FOLLOWING A MEAL 12/14/18   Deboraha Sprang, MD  Multiple Vitamin (MULTIVITAMIN WITH MINERALS) TABS tablet Take 1 tablet by mouth daily.    [provider]  nitroGLYCERIN (NITROSTAT) 0.4 MG SL tablet Place 0.4 mg under the tongue every 5 (five) minutes as needed for chest pain.    [provider]  omega-3 acid ethyl esters (LOVAZA) 1 g capsule Take 2 g by mouth 2 (two) times daily.     [provider]  pioglitazone (ACTOS) 15 MG tablet Take 15 mg by mouth daily.    [provider]  potassium chloride SA (K-DUR,KLOR-CON) 20 MEQ tablet Take 1 tablet (20 mEq total) by mouth daily. Patient taking differently: Take 20 mEq by mouth daily as needed (take along with lasix when  needed).  03/14/17   Patsey Berthold, NP  ranolazine (RANEXA) 500 MG 12 hr tablet Take 1 tablet (500 mg total) by mouth 2 (two) times daily. 03/14/17   Seiler, Amber K, NP      VITAL SIGNS:  Blood pressure 90/60, pulse 70, temperature 97.8 F (36.6 C), resp. rate 19, height 5' 4"  (1.626 m), weight 81.6 kg, SpO2 100 %.  PHYSICAL EXAMINATION:  GENERAL:  79 y.o.-year-old patient lying in the bed with no acute distress. Appears weak and pale EYES: Pupils equal, round, reactive to light and accommodation. No scleral icterus.  HEENT: Head atraumatic, normocephalic. Oropharynx and nasopharynx clear. Oral mucosa dry NECK:  Supple, no jugular venous distention. No thyroid enlargement, no tenderness.  LUNGS: Normal breath sounds bilaterally, no wheezing, rales,rhonchi or crepitation. No use of accessory muscles of respiration.  CARDIOVASCULAR: S1, S2 normal. No murmurs, rubs, or gallops.  ABDOMEN: Soft, nontender, nondistended. Bowel sounds present. No organomegaly or mass.  EXTREMITIES: +pedal edema,  No cyanosis, or clubbing.  NEUROLOGIC: Cranial nerves II through XII are intact. Muscle strength 4+/5 in all extremities. Sensation intact. Gait not checked. gen weakness PSYCHIATRIC: The patient is alert and oriented x 3.  SKIN: No obvious rash, lesion, or ulcer.   LABORATORY PANEL:   CBC Recent Labs  Lab 07/02/19 0555  WBC 7.9  HGB 7.1*  HCT 22.5*  PLT 214   ------------------------------------------------------------------------------------------------------------------  Chemistries  Recent Labs  Lab 07/02/19 0555  NA 131*  K 5.2*  CL 95*  CO2 25  GLUCOSE 158*  BUN 45*  CREATININE 2.42*  CALCIUM 8.7*   ------------------------------------------------------------------------------------------------------------------  Cardiac Enzymes No results for input(s): TROPONINI in the last 168  hours. ------------------------------------------------------------------------------------------------------------------  RADIOLOGY:  DG Chest 1 View  Result Date: 07/02/2019 CLINICAL DATA:  Shortness of breath. Increasing weakness. EXAM: CHEST  1 VIEW COMPARISON:  Radiograph 03/15/2018. FINDINGS: Multi lead left-sided pacemaker. Cardiomegaly with unchanged mediastinal contours. Peribronchial thickening with question of minimal Kerley B-lines at the bases. No evidence of pneumonia. No large pleural effusion. No pneumothorax. IMPRESSION: Stable cardiomegaly.  Probable mild pulmonary edema. Electronically Signed   By: Keith Rake M.D.   On: 07/02/2019 06:09    EKG:   NSR, prolong PR IMPRESSION AND PLAN:  Heather Peterson  is a 79 y.o. 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 Acute on chronic renal failure stage IIIb due to poor PO intake/dehydration/on diuretics/and possible G.I. bleed -admit to telemetry -IV fluids -avoid nephrotoxins -input output -monitor creatinine -consider nephrology consultation if creat worsens -hold Lasix  2. Hypotension with poor PO intake and dehydration/oral meds -hold blood pressure meds -IV fluids cautiously given history of CHF  3. Acute on chronic anemia rule out G.I. bleed in the setting of CKD -one unit blood transfusion ordered by ER physician -follow-up iron studies, vitamin B12 -history of hemorrhoids which occasionally bleeds mildly proficient G.I. consultation-with Dr. Vicente Males -if G.I. considers workup patient will need cardiac clearance -holding  aspirin Plavix -continue Protonix  Addendum: dter reports pt has had routine colonoscopy in 2017 in [facility] some polyps. No EGD was done  4. Congestive heart failure chronic systolic diastolic -most recent echo as outpatient December 2020 showed EF of 40% -patient follows with Dr. Saralyn Pilar -hold beta-blockers, aspirin Plavix, Lasix  5.h/o V tach/V fiib s/p ICD  6. Mild hyperkalemia -hold po K -IVF -f/u BMP -EKG no peaked T waves at present  7. DVT prophylaxis SCD  8.Type 2 DM, uncontrolled with CKD -III , on insulin -SSI for now -resume Lantus once decision is made if Gi evaluation is being considered -check A1c   Family Communication : daughter on the phone Consults :GI Code Status :FULL code--d/w pt in ER DVT prophylaxis :SCD  TOTAL TIME TAKING CARE OF THIS PATIENT: *50* minutes.    Fritzi Mandes M.D  [facility]     CC: Primary care physician; Ellison Hughs Chrissie Noa, MD

## 2019-07-02 NOTE — ED Provider Notes (Signed)
[facility] Emergency Department Provider Note    First MD Initiated Contact with Patient 07/02/19 [phone]     (approximate)  I have reviewed the triage vital signs and the nursing notes.   HISTORY  Chief Complaint Shortness of Breath and Weakness    HPI BRIAUNNA GRINDSTAFF is a 79 y.o. female both past medical history on aspirin Plavix presents the ER for 1 week of progressively worsening generalized weakness shortness of breath.  Not having fevers.  States she feels very weak and lightheaded anytime she stands.  Still having chest pain.  No nausea or vomiting or diarrhea.  Has had normal intake.  No recent medication changes.  Denies any dysuria or increased frequency.  Is not noted any melena or hematochezia.    Past Medical History:  Diagnosis Date  . Anxiety   . Chronic combined systolic and diastolic CHF (congestive heart failure) ([place])   . Coronary artery disease   . Depression   . Diabetes mellitus without complication ([place])   . Diabetes mellitus, type II ([place])   . Hypertension   . MI (myocardial infarction) ([place])    x 5  . Pacemaker   . Thyroid disease    Family History  Problem Relation Age of Onset  . Hypertension Father   . Heart attack Father   . Depression Sister   . Depression Brother   . Depression Brother    Past Surgical History:  Procedure Laterality Date  . CENTRAL LINE INSERTION  03/11/2017   Procedure: CENTRAL LINE INSERTION;  Surgeon: Leonie Man, MD;  Location: [place] [facility];  Service: Cardiovascular;;  . CHOLECYSTECTOMY    . CORONARY STENT INTERVENTION W/IMPELLA N/A 03/11/2017   Procedure: Coronary Stent Intervention w/Impella;  Surgeon: Leonie Man, MD;  Location: [place] [facility];  Service: Cardiovascular;  Laterality: N/A;  . EYE SURGERY    . INTRAVASCULAR PRESSURE WIRE/FFR STUDY N/A 03/11/2017   Procedure: INTRAVASCULAR PRESSURE WIRE/FFR STUDY;  Surgeon: Leonie Man, MD;  Location: [place]  CV [facility];  Service: Cardiovascular;  Laterality: N/A;  . INTRAVASCULAR ULTRASOUND/IVUS N/A 03/11/2017   Procedure: Intravascular Ultrasound/IVUS;  Surgeon: Leonie Man, MD;  Location: [place] [facility];  Service: Cardiovascular;  Laterality: N/A;  . LEFT HEART CATH AND CORONARY ANGIOGRAPHY N/A 03/05/2017   Procedure: LEFT HEART CATH AND CORONARY ANGIOGRAPHY;  Surgeon: Isaias Cowman, MD;  Location: [place] [facility];  Service: Cardiovascular;  Laterality: N/A;  . PACEMAKER IMPLANT    . pacemaker/defibrillator Left    Patient Active Problem List   Diagnosis Date Noted  . MDD (major depressive disorder), recurrent episode, mild ([place]) 12/14/2018  . Tobacco use disorder 12/14/2018  . At risk for prolonged QT interval syndrome 12/14/2018  . 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 Pedal edema 02/19/2018  . SOB (shortness of breath) on exertion 02/19/2018  . Left main coronary artery disease 03/07/2017  . Drug-induced torsades de pointes 03/06/2017  . Ventricular tachycardia ([place]) 03/05/2017  . Defibrillator discharge 03/04/2017  . Chronic combined systolic and diastolic CHF (congestive heart failure) ([place]) 03/04/2017  . COPD (chronic obstructive pulmonary disease) ([place]) 02/10/2017  . Diabetes mellitus type 2, uncomplicated ([place]) 84/16/6063  . H/O ventricular fibrillation 02/10/2017  . Hyperlipidemia 02/10/2017  . 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 Ischemic cardiomyopathy 02/10/2017  . Myocardial infarction ([place]) 02/10/2017  . Moderate mitral regurgitation 02/10/2017  . Syncope and collapse 02/04/2017  . CAD (coronary artery disease) 02/04/2017  .  HTN (hypertension) 02/04/2017  . Diabetes ([place]) 02/04/2017      Prior to Admission medications   Medication Sig Start Date End Date Taking? Authorizing Provider  acetaminophen (TYLENOL) 500 MG tablet Take 500 mg by mouth every 4 (four) hours as needed for mild pain, fever or headache.      [provider]  albuterol (PROVENTIL HFA;VENTOLIN HFA) 108 (90 Base) MCG/ACT inhaler Inhale 2 puffs into the lungs every 4 (four) hours as needed for wheezing or shortness of breath. 02/05/17   Nicholes Mango, MD  aspirin EC 81 MG tablet Take 81 mg by mouth daily.    [provider]  benzonatate (TESSALON PERLES) 100 MG capsule Take 1 capsule (100 mg total) by mouth 3 (three) times daily as needed for cough. 03/15/18   Marylene Land, NP  clopidogrel (PLAVIX) 75 MG tablet Take 1 tablet (75 mg total) by mouth daily. 03/14/17   Patsey Berthold, NP  doxycycline (VIBRAMYCIN) 100 MG capsule Take 1 capsule (100 mg total) by mouth 2 (two) times daily. 03/15/18   Marylene Land, NP  Fluticasone-Salmeterol (ADVAIR DISKUS) 250-50 MCG/DOSE AEPB Inhale 1 puff into the lungs 2 (two) times daily. 02/05/17 02/05/18  Nicholes Mango, MD  furosemide (LASIX) 40 MG tablet Take 40 mg by mouth daily as needed for fluid or edema.    [provider]  lamoTRIgine (LAMICTAL) 25 MG tablet TAKE 3 TABLETS(75 MG) BY MOUTH DAILY 12/14/18   Ursula Alert, MD  levothyroxine (SYNTHROID) 50 MCG tablet  11/27/18   [provider]  levothyroxine (SYNTHROID, LEVOTHROID) 75 MCG tablet Take by mouth. 02/18/18 02/18/19  [provider]  metFORMIN (GLUCOPHAGE-XR) 750 MG 24 hr tablet Take 375 mg by mouth 2 (two) times daily.     [provider]  metoprolol succinate (TOPROL-XL) 25 MG 24 hr tablet TAKE 1/2 TABLET BY MOUTH DAILY WITH OR IMMEDIATELY FOLLOWING A MEAL 12/14/18   Deboraha Sprang, MD  mexiletine (MEXITIL) 200 MG capsule Take 1 capsule (200 mg total) by mouth every 12 (twelve) hours. 03/14/17   Patsey Berthold, NP  Multiple Vitamin (MULTIVITAMIN WITH MINERALS) TABS tablet Take 1 tablet by mouth daily.    [provider]  nitroGLYCERIN (NITROSTAT) 0.4 MG SL tablet Place 0.4 mg under the tongue every 5 (five) minutes as needed for chest pain.    [provider]  omega-3 acid  ethyl esters (LOVAZA) 1 g capsule Take 2 g by mouth 2 (two) times daily.     [provider]  pioglitazone (ACTOS) 15 MG tablet Take 15 mg by mouth daily.    [provider]  potassium chloride SA (K-DUR,KLOR-CON) 20 MEQ tablet Take 1 tablet (20 mEq total) by mouth daily. Patient taking differently: Take 20 mEq by mouth daily as needed (take along with lasix when needed).  03/14/17   Patsey Berthold, NP  predniSONE (DELTASONE) 20 MG tablet Take 2 tablets 40 mg total by mouth daily for 3 days, then take 1 tablet 20 mg total by mouth daily for 2 days. 03/15/18   Marylene Land, NP  ranolazine (RANEXA) 500 MG 12 hr tablet Take 1 tablet (500 mg total) by mouth 2 (two) times daily. 03/14/17   Chanetta Marshall K, NP  simvastatin (ZOCOR) 40 MG tablet Take 20 mg by mouth at bedtime.     [provider]    Allergies Celebrex [celecoxib], Glipizide, Sulfa antibiotics, Levaquin [levofloxacin in d5w], Levofloxacin, Metformin, and Penicillins    Social History Social History  Tobacco Use  . Smoking status: Current Every Day Smoker    Packs/day: 0.25    Types: E-cigarettes, Cigarettes  . Smokeless tobacco: Never Used  Substance Use Topics  . Alcohol use: Not Currently    Comment: occasionally  . Drug use: No    Review of Systems Patient denies headaches, rhinorrhea, blurry vision, numbness, shortness of breath, chest pain, edema, cough, abdominal pain, nausea, vomiting, diarrhea, dysuria, fevers, rashes or hallucinations unless otherwise stated above in HPI. ____________________________________________   PHYSICAL EXAM:  VITAL SIGNS: Vitals:   07/02/19 0655 07/02/19 0656  BP: 90/60   Pulse:  70  Resp: 18 19  Temp:    SpO2:  100%    Constitutional: Alert and oriented.  Eyes: Conjunctivae are normal.  Head: Atraumatic. Nose: No congestion/rhinnorhea. Mouth/Throat: Mucous membranes are moist.   Neck: No stridor. Painless ROM.  Cardiovascular: Normal rate,  regular rhythm. Grossly normal heart sounds.  Good peripheral circulation. Respiratory: Normal respiratory effort.  No retractions. Coarse bibasilar breathsounds. Gastrointestinal: Soft and nontender. No distention. No abdominal bruits. No CVA tenderness. Genitourinary: brown, non melenotic nonbloody stool.  Non thrombosed hemrroids present.  Guaiac is faintly positive Musculoskeletal: No lower extremity tenderness nor edema.  No joint effusions. Neurologic:  Normal speech and language. No gross focal neurologic deficits are appreciated. No facial droop Skin:  Skin is warm, dry and intact. No rash noted. Psychiatric: Mood and affect are normal. Speech and behavior are normal.  ____________________________________________   LABS (all labs ordered are listed, but only abnormal results are displayed)  Results for orders placed or performed during the hospital encounter of 07/02/19 (from the past 24 hour(s))  Troponin I (High Sensitivity)     Status: None   Collection Time: 07/02/19  5:55 AM  Result Value Ref Range   Troponin I (High Sensitivity) 15 <18 ng/L  CBC with Differential     Status: Abnormal   Collection Time: 07/02/19  5:55 AM  Result Value Ref Range   WBC 7.9 4.0 - 10.5 K/uL   RBC 2.55 (L) 3.87 - 5.11 MIL/uL   Hemoglobin 7.1 (L) 12.0 - 15.0 g/dL   HCT 22.5 (L) 36.0 - 46.0 %   MCV 88.2 80.0 - 100.0 fL   MCH 27.8 26.0 - 34.0 pg   MCHC 31.6 30.0 - 36.0 g/dL   RDW 19.4 (H) 11.5 - 15.5 %   Platelets 214 150 - 400 K/uL   nRBC 0.4 (H) 0.0 - 0.2 %   Neutrophils Relative % 61 %   Neutro Abs 4.9 1.7 - 7.7 K/uL   Lymphocytes Relative 23 %   Lymphs Abs 1.8 0.7 - 4.0 K/uL   Monocytes Relative 12 %   Monocytes Absolute 1.0 0.1 - 1.0 K/uL   Eosinophils Relative 2 %   Eosinophils Absolute 0.1 0.0 - 0.5 K/uL   Basophils Relative 1 %   Basophils Absolute 0.0 0.0 - 0.1 K/uL   Immature Granulocytes 1 %   Abs Immature Granulocytes 0.09 (H) 0.00 - 0.07 K/uL  Basic metabolic panel      Status: Abnormal   Collection Time: 07/02/19  5:55 AM  Result Value Ref Range   Sodium 131 (L) 135 - 145 mmol/L   Potassium 5.2 (H) 3.5 - 5.1 mmol/L   Chloride 95 (L) 98 - 111 mmol/L   CO2 25 22 - 32 mmol/L   Glucose, Bld 158 (H) 70 - 99 mg/dL   BUN 45 (H) 8 - 23 mg/dL   Creatinine, Ser  2.42 (H) 0.44 - 1.00 mg/dL   Calcium 8.7 (L) 8.9 - 10.3 mg/dL   GFR calc non Af Amer 19 (L) >60 mL/min   GFR calc Af Amer 22 (L) >60 mL/min   Anion gap 11 5 - 15  Prepare RBC     Status: None (Preliminary result)   Collection Time: 07/02/19  7:30 AM  Result Value Ref Range   Order Confirmation PENDING    ____________________________________________  EKG My review and personal interpretation at Time: 5:54   Indication: sob   Rate: 70  Rhythm: sinus Axis: normal Other: prolonged qt, no stemi, no depression ____________________________________________  RADIOLOGY  I personally reviewed all radiographic images ordered to evaluate for the above acute complaints and reviewed radiology reports and findings.  These findings were personally discussed with the patient.  Please see medical record for radiology report.  ____________________________________________   PROCEDURES  Procedure(s) performed:  .Critical Care Performed by: Merlyn Lot, MD Authorized by: Merlyn Lot, MD   Critical care provider statement:    Critical care time (minutes):  30   Critical care time was exclusive of:  Separately billable procedures and treating other patients   Critical care was necessary to treat or prevent imminent or life-threatening deterioration of the following conditions:  Circulatory failure   Critical care was time spent personally by me on the following activities:  Development of treatment plan with patient or surrogate, discussions with consultants, evaluation of patient's response to treatment, examination of patient, obtaining history from patient or surrogate, ordering and performing  treatments and interventions, ordering and review of laboratory studies, ordering and review of radiographic studies, pulse oximetry, re-evaluation of patient's condition and review of old charts      Critical Care performed: yes ____________________________________________   INITIAL IMPRESSION / [place] / ED COURSE  Pertinent labs & imaging results that were available during my care of the patient were reviewed by me and considered in my medical decision making (see chart for details).   DDX: chf, pna, copd, covid, anemia, dehydration, acs  LACOLE KOMOROWSKI is a 79 y.o. who presents to the ED with symptoms as described above.  Patient on her baseline O2.  Blood work shows acute anemia as well as renal insufficiency.  Does have some findings of mild CHF due to high suspicion the majority of her symptoms related to symptomatic anemia.  The patient will be placed on continuous pulse oximetry and telemetry for monitoring.  Laboratory evaluation will be sent to evaluate for the above complaints.     Clinical Course as of Jul 01 724  Fri Jul 02, 2019  0711 Rectal exam without melena or hematochezia.  Stool is brown but weakly positive..   [PR]  9794 Creatinine(!): 2.42 [PR]    Clinical Course User Index [PR] Merlyn Lot, MD    The patient was evaluated in Emergency Department today for the symptoms described in the history of present illness. He/she was evaluated in the context of the global COVID-19 pandemic, which necessitated consideration that the patient might be at risk for infection with the SARS-CoV-2 virus that causes COVID-19. Institutional protocols and algorithms that pertain to the evaluation of patients at risk for COVID-19 are in a state of rapid change based on information released by regulatory bodies including the CDC and federal and state organizations. These policies and algorithms were followed during the patient's care in the ED.  As part of my  medical decision making, I reviewed the following data  within the [place] notes reviewed and incorporated, Labs reviewed, notes from prior ED visits and [place] Controlled Substance Database   ____________________________________________   FINAL CLINICAL IMPRESSION(S) / ED DIAGNOSES  Final diagnoses:  Weakness  Dehydration  AKI (acute kidney injury) ([place])  Anemia, unspecified type      NEW MEDICATIONS STARTED DURING THIS VISIT:  New Prescriptions   No medications on file     Note:  This document was prepared using Dragon voice recognition software and may include unintentional dictation errors.    Merlyn Lot, MD 07/02/19 [phone]

## 2019-07-02 NOTE — Care Management (Signed)
TOC RN CM: Attempted to meet with patient however currently on bedside commode with tech. Will reattempt at a later time.

## 2019-07-02 NOTE — Progress Notes (Signed)
PT Cancellation Note  Patient Details Name: ELDORA NAPP MRN: [id number] DOB: 08-09-1940   Cancelled Treatment:    Reason Eval/Treat Not Completed: Other (comment). Consult received and chart reviewed. Patient noted with elevated K+ (5.2). Per PT guidelines for elevated potassium, pt contraindicated for physical therapy at this time.  Will re-attempt PT evaluation at a later date/time as medically appropriate.   Lieutenant Diego PT, DPT 2:51 PM,07/02/19

## 2019-07-02 NOTE — TOC Initial Note (Signed)
Transition of Care [facility]) - Initial/Assessment Note    Patient Details  Name: Heather Peterson MRN: [id number] Date of Birth: 10-Apr-1941  [facility]) CM/SW Contact:    Anselm Pancoast, RN Phone Number: 07/02/2019, 3:38 PM  Clinical Narrative:                 Spoke with patient who states she lives at home with her husband and daughter who assist in her care and drive her anywhere she needs to be. Patient is on O2@2liters  @ home through [place]. Patient agreeable to home health care and prefers [facility] if available. No other needs at this time per patient.  Expected Discharge Plan: [place]     Patient Goals and CMS Choice Patient states their goals for this hospitalization and ongoing recovery are:: Get feeling better and get back home      Expected Discharge Plan and Services Expected Discharge Plan: [place] arrangements for the past 2 months: Single Family Home                                      Prior Living Arrangements/Services Living arrangements for the past 2 months: Single Family Home Lives with:: Spouse, Adult Children(Lives with husband and adult daughter) Patient language and need for interpreter reviewed:: Yes Do you feel safe going back to the place where you live?: Yes      Need for Family Participation in Patient Care: Yes (Comment) Care giver support system in place?: Yes (comment) Current home services: DME(walker) Criminal Activity/Legal Involvement Pertinent to Current Situation/Hospitalization: No - Comment as needed  Activities of Daily Living      Permission Sought/Granted Permission sought to share information with : Facility Art therapist granted to share information with : Yes, Verbal Permission Granted     Permission granted to share info w AGENCY: [facility]        Emotional Assessment Appearance:: Appears stated  age Attitude/Demeanor/Rapport: Engaged Affect (typically observed): Accepting Orientation: : Oriented to Self, Oriented to Place, Oriented to  Time, Oriented to Situation Alcohol / Substance Use: Tobacco Use(active smoker) Psych Involvement: No (comment)  Admission diagnosis:  Acute renal failure superimposed on stage 3 chronic kidney disease, unspecified acute renal failure type, unspecified whether stage 3a or 3b CKD ([place]) [N17.9, N18.30] Patient Active Problem List   Diagnosis Date Noted  . Acute renal failure superimposed on stage 3 chronic kidney disease ([place]) 07/02/2019  . Weakness   . Anemia   . Hyperkalemia   . MDD (major depressive disorder), recurrent episode, mild ([place]) 12/14/2018  . Tobacco use disorder 12/14/2018  . At risk for prolonged QT interval syndrome 12/14/2018  . 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 Pedal edema 02/19/2018  . SOB (shortness of breath) on exertion 02/19/2018  . Left main coronary artery disease 03/07/2017  . Drug-induced torsades de pointes 03/06/2017  . Ventricular tachycardia ([place]) 03/05/2017  . Defibrillator discharge 03/04/2017  . CHF (congestive heart failure), NYHA class III, chronic, combined ([place]) 03/04/2017  . COPD (chronic obstructive pulmonary disease) ([place]) 02/10/2017  . Diabetes mellitus type 2, uncomplicated ([place]) 46/50/3546  . H/O ventricular fibrillation 02/10/2017  . Hyperlipidemia 02/10/2017  . 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 Ischemic cardiomyopathy 02/10/2017  . Myocardial infarction ([place]) 02/10/2017  . Moderate mitral regurgitation 02/10/2017  .  Syncope and collapse 02/04/2017  . CAD (coronary artery disease) 02/04/2017  . HTN (hypertension) 02/04/2017  . Diabetes ([place]) 02/04/2017   PCP:  [name], MD Pharmacy:   [facility] [phone] [address] [place] [address] Phone:  [phone] Fax: [phone]     Social Determinants of Health (SDOH) Interventions    Readmission Risk Interventions No flowsheet data found.

## 2019-07-02 NOTE — ED Triage Notes (Addendum)
Pt to ED via EMS from home, per pt she has had increased weakness x1week and shortness of breath x few days. Pt has hx of chf, on 2L chronically. Pt arrives in no acute distress, able to talk in complete sentences, denies cough or fevers at home,

## 2019-07-02 NOTE — Progress Notes (Addendum)
Pt received 1 unit of blood (HGB at 7.1) as per day shift nurse, but labs has not shown a recheck of hgb after blood transfusion. Notify prime and talked to Stark Klein states will place order. Will continue to monitor.  Update 0016. HGB was recheck and it came up to 8.8. Notified prime. Will continue to monitor.  Update 2310: Pt is requesting something for sleep. Notify [place] and place an order for 1 time melatonin 10 mg. Will continue to monitor.

## 2019-07-02 NOTE — ED Notes (Signed)
Pt assisted to bedside toilet at this time. Pt ambulated with a steady gate with this nurse in room for support. Pt ambulated back to bed and is comfortable at this time.  Pt given water, pillow, and tv remote for comfort. Will continue to monitor.

## 2019-07-03 ENCOUNTER — Encounter: Payer: Self-pay | Admitting: Internal Medicine

## 2019-07-03 DIAGNOSIS — J9611 Chronic respiratory failure with hypoxia: Secondary | ICD-10-CM

## 2019-07-03 DIAGNOSIS — E871 Hypo-osmolality and hyponatremia: Secondary | ICD-10-CM

## 2019-07-03 DIAGNOSIS — R9431 Abnormal electrocardiogram [ECG] [EKG]: Secondary | ICD-10-CM | POA: Diagnosis present

## 2019-07-03 LAB — BASIC METABOLIC PANEL
Anion gap: 11 (ref 5–15)
BUN: 49 mg/dL — ABNORMAL HIGH (ref 8–23)
CO2: 21 mmol/L — ABNORMAL LOW (ref 22–32)
Calcium: 8.4 mg/dL — ABNORMAL LOW (ref 8.9–10.3)
Chloride: 96 mmol/L — ABNORMAL LOW (ref 98–111)
Creatinine, Ser: 2.32 mg/dL — ABNORMAL HIGH (ref 0.44–1.00)
GFR calc Af Amer: 23 mL/min — ABNORMAL LOW (ref >60)
GFR calc non Af Amer: 20 mL/min — ABNORMAL LOW (ref >60)
Glucose, Bld: 119 mg/dL — ABNORMAL HIGH (ref 70–99)
Potassium: 4.9 mmol/L (ref 3.5–5.1)
Sodium: 128 mmol/L — ABNORMAL LOW (ref 135–145)

## 2019-07-03 LAB — GLUCOSE, CAPILLARY
Glucose-Capillary: 123 mg/dL — ABNORMAL HIGH (ref 70–99)
Glucose-Capillary: 116 mg/dL — ABNORMAL HIGH (ref 70–99)
Glucose-Capillary: 156 mg/dL — ABNORMAL HIGH (ref 70–99)
Glucose-Capillary: 119 mg/dL — ABNORMAL HIGH (ref 70–99)
Glucose-Capillary: 113 mg/dL — ABNORMAL HIGH (ref 70–99)

## 2019-07-03 LAB — TYPE AND SCREEN
ABO/RH(D): B POS
Antibody Screen: NEGATIVE
Unit division: 0

## 2019-07-03 LAB — CBC
HCT: 25.2 % — ABNORMAL LOW (ref 36.0–46.0)
Hemoglobin: 8.2 g/dL — ABNORMAL LOW (ref 12.0–15.0)
MCH: 28.5 pg (ref 26.0–34.0)
MCHC: 32.5 g/dL (ref 30.0–36.0)
MCV: 87.5 fL (ref 80.0–100.0)
Platelets: 214 10*3/uL (ref 150–400)
RBC: 2.88 MIL/uL — ABNORMAL LOW (ref 3.87–5.11)
RDW: 18.5 % — ABNORMAL HIGH (ref 11.5–15.5)
WBC: 7.4 10*3/uL (ref 4.0–10.5)
nRBC: 0.4 % — ABNORMAL HIGH (ref 0.0–0.2)

## 2019-07-03 LAB — BPAM RBC
Blood Product Expiration Date: 202103182359
ISSUE DATE / TIME: 202102190952
Unit Type and Rh: 7300

## 2019-07-03 LAB — HEMOGLOBIN AND HEMATOCRIT, BLOOD
HCT: 27.2 % — ABNORMAL LOW (ref 36.0–46.0)
Hemoglobin: 8.8 g/dL — ABNORMAL LOW (ref 12.0–15.0)

## 2019-07-03 MED ORDER — PROCHLORPERAZINE EDISYLATE 10 MG/2ML IJ SOLN
10.0000 mg | Freq: Four times a day (QID) | INTRAMUSCULAR | Status: DC | PRN
  Administered 2019-07-03: 10 mg via INTRAVENOUS
  Filled 2019-07-03 (×2): qty 2

## 2019-07-03 MED ORDER — FOLIC ACID 1 MG PO TABS
1.0000 mg | ORAL_TABLET | Freq: Every day | ORAL | Status: DC
  Administered 2019-07-03 – 2019-07-05 (×3): 1 mg via ORAL
  Filled 2019-07-03 (×3): qty 1

## 2019-07-03 MED ORDER — GUAIFENESIN-DM 100-10 MG/5ML PO SYRP
5.0000 mL | ORAL_SOLUTION | ORAL | Status: DC | PRN
  Administered 2019-07-03: 5 mL via ORAL
  Filled 2019-07-03: qty 5

## 2019-07-03 MED ORDER — LEVOTHYROXINE SODIUM 50 MCG PO TABS
75.0000 ug | ORAL_TABLET | ORAL | Status: DC
  Administered 2019-07-05: 75 ug via ORAL

## 2019-07-03 MED ORDER — ZOLPIDEM TARTRATE 5 MG PO TABS
5.0000 mg | ORAL_TABLET | Freq: Every evening | ORAL | Status: DC | PRN
  Administered 2019-07-03: 5 mg via ORAL
  Filled 2019-07-03: qty 1

## 2019-07-03 MED ORDER — LEVOTHYROXINE SODIUM 50 MCG PO TABS
50.0000 ug | ORAL_TABLET | ORAL | Status: DC
  Administered 2019-07-03 – 2019-07-04 (×2): 50 ug via ORAL
  Filled 2019-07-03 (×2): qty 1

## 2019-07-03 MED ORDER — SALINE SPRAY 0.65 % NA SOLN
1.0000 | NASAL | Status: DC | PRN
  Administered 2019-07-04: 1 via NASAL
  Filled 2019-07-03 (×2): qty 44

## 2019-07-03 MED ORDER — DIAZEPAM 2 MG PO TABS
1.0000 mg | ORAL_TABLET | Freq: Once | ORAL | Status: AC
  Administered 2019-07-03: 1 mg via ORAL
  Filled 2019-07-03: qty 1

## 2019-07-03 NOTE — Progress Notes (Addendum)
Pt complaining of nausea and restless legs. Dr. Sarajane Jews notified.see orders. Will continue to monitor.

## 2019-07-03 NOTE — Evaluation (Signed)
Physical Therapy Evaluation Patient Details Name: Heather Peterson MRN: [id number] DOB: 01/24/1941 Today's Date: 07/03/2019   History of Present Illness  Heather Peterson  is a 79 y.o. 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 She was diagnosed with acute renal failure/dehydration;  Clinical Impression  79 yo Female came to ED with increased fatigue/shortness of breath. She was found to have acute renal failure. Patient was living at home with her husband and daughter. She reports being mostly mod I for all ADLs although admits that she has been slowing down some and feeling weak. Patient has a rollator at home that she will use intermittently so that she has a seat to sit down if needed. She currently is mod I for bed mobility, supervision for sit<>stand transfers. She ambulated 50 feet with IV pole requiring CGA for safety. She exhibits short step length, slower gait speed, shuffled steps, with occasional drift side/side. SPo2 levels 96% following gait on 3L o2. Patient does exhibit weakness in BLE grossly 3/5. She would benefit from skilled PT intervention to improve strength, balance and gait safety. Recommend HH PT upon discharge to improve safety awareness and increase strength;     Follow Up Recommendations Home health PT    Equipment Recommendations  None recommended by PT    Recommendations for Other Services       Precautions / Restrictions Precautions Precautions: Fall Restrictions Weight Bearing Restrictions: No      Mobility  Bed Mobility Overal bed mobility: Modified Independent                Transfers Overall transfer level: Needs assistance Equipment used: Rolling walker (2 wheeled) Transfers: Sit to/from Stand Sit to Stand: Supervision         General transfer comment: for safety awareness  Ambulation/Gait Ambulation/Gait assistance: Min guard Gait Distance (Feet): 50 Feet Assistive device: IV Pole Gait Pattern/deviations: Step-through pattern;Decreased step length - left;Decreased step length - right;Decreased dorsiflexion - right;Decreased dorsiflexion - left;Shuffle;Narrow base of support Gait velocity: decreased   General Gait Details: requires CGA for safety, occasional drift left/right with slight unsteadiness;  Stairs            Wheelchair Mobility    Modified Rankin (Stroke Patients Only)       Balance Overall balance assessment: Needs assistance Sitting-balance support: No upper extremity supported;Feet supported Sitting balance-Leahy Scale: Good     Standing balance support: During functional activity;Bilateral upper extremity supported Standing balance-Leahy Scale: Fair Standing balance comment: requires CGA for safety with walking with IV pole                             Pertinent Vitals/Pain Pain Assessment: 0-10 Pain Score: 8  Pain Location: BLE legs Pain Descriptors / Indicators: Aching;Restless Pain Intervention(s): Limited activity within patient's tolerance;Monitored during session;Repositioned;Patient requesting pain meds-RN notified    Home Living Family/patient expects to be discharged to:: Private residence Living Arrangements: Spouse/significant other;Children(daughter lives with her.) Available Help at Discharge: Family;Available 24 hours/day Type of Home: House Home Access: Stairs to enter Entrance Stairs-Rails: None Entrance Stairs-Number of Steps: 2 Home Layout:  One level Home Equipment: [place]good - 4 wheels;Cane - single point;Shower seat;Grab bars - tub/shower Additional Comments: pt  reports living with her husband and her daughter lives with them;    Prior Function Level of Independence: Independent         Comments: reports using rollator intermittently to give her a seat when short of breath, but otherwise would not use any assistive device; mod I for shower/dressing     Hand Dominance   Dominant Hand: Right    Extremity/Trunk Assessment   Upper Extremity Assessment Upper Extremity Assessment: Overall WFL for tasks assessed    Lower Extremity Assessment Lower Extremity Assessment: Generalized weakness       Communication   Communication: No difficulties  Cognition Arousal/Alertness: Awake/alert Behavior During Therapy: WFL for tasks assessed/performed Overall Cognitive Status: Within Functional Limits for tasks assessed                                        General Comments      Exercises     Assessment/Plan    PT Assessment Patient needs continued PT services  PT Problem List Decreased strength;Decreased mobility;Decreased safety awareness;Decreased activity tolerance;Decreased balance       PT Treatment Interventions Therapeutic exercise;Gait training;Balance training;Stair training;Functional mobility training;Therapeutic activities;Patient/family education    PT Goals (Current goals can be found in the Care Plan section)  Acute Rehab PT Goals Patient Stated Goal: to get stronger and get home PT Goal Formulation: With patient Time For Goal Achievement: 07/17/19 Potential to Achieve Goals: Good    Frequency Min 2X/week   Barriers to discharge Inaccessible home environment has 2 steps to enter without rails.    Co-evaluation               AM-PAC PT "6 Clicks" Mobility  Outcome Measure Help needed turning from your back to your side while in a flat bed without using bedrails?: None Help needed moving from lying on your back to sitting on the side of a flat bed without using bedrails?: A Little Help  needed moving to and from a bed to a chair (including a wheelchair)?: A Little Help needed standing up from a chair using your arms (e.g., wheelchair or bedside chair)?: A Little Help needed to walk in hospital room?: A Little Help needed climbing 3-5 steps with a railing? : A Little 6 Click Score: 19    End of Session Equipment Utilized During Treatment: Gait belt Activity Tolerance: Patient limited by pain Patient left: in chair;with call bell/phone within reach;with chair alarm set Nurse Communication: Mobility status PT Visit Diagnosis: Unsteadiness on feet (R26.81);Muscle weakness (generalized) (M62.81)    Time: 6384-6659 PT Time Calculation (min) (ACUTE ONLY): 28 min   Charges:   PT Evaluation $PT Eval Low Complexity: 1 Low            Rain Wilhide PT, DPT 07/03/2019, 3:32 PM

## 2019-07-03 NOTE — Progress Notes (Addendum)
Pt states that she's having a jittery feeling and unable to get sleep which pt thinks it comes from receiving 1 unit blood yesterday. VSS except BP 102/54. Notify Ouma and ordered 1 mg of valium once. Will continue to monitor.  Update 0345: Pt was asleep after the valium administration. Prime made aware. Will continue to monitor.

## 2019-07-03 NOTE — Plan of Care (Signed)

## 2019-07-03 NOTE — Progress Notes (Addendum)
Pt was complaining of SOB on a 3 liters oxygen. Administer breathing treatment PRN and pt states " It help some". Daughter was also at the bedside and requesting if pt can have 1 dose of IV lasix. Lasix was on hold due to elevated kidney functions and daughter was aware, but concerns of pts SOB at this time. Notify prime. Will continue to monitor.   Update 0830: Talked to [facility] and states will round on patients. Will continue to monitor.

## 2019-07-03 NOTE — Progress Notes (Signed)
PROGRESS NOTE  Heather Peterson AJO:[id number] DOB: 1940-11-16 DOA: 07/02/2019 PCP: [name], MD  Brief History   79 year old woman PMH combined systolic and diastolic CHF, CAD status post stent, VF/VT status post ICD placement, prolonged QTC, presented with poor appetite, decreased energy and increased fatigue, shortness of breath for 3 to 4 days.  Poor appetite.  Found to be anemic and to have acute kidney injury.  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  A & P  Acute kidney injury superimposed on CKD stage IIIb, secondary to poor oral intake, dehydration complicated by diuretics.  GI bleed initially considered. --Lasix held.  No significant improvement yet.  Continue gentle fluids.  Recheck BMP in a.m.  Hypotension secondary to poor oral intake, dehydration, diuretics --Resolved.  Continue to hold Lasix.  Monitor volume status closely given history of CHF.  For now gentle IV fluids.  Acute on chronic anemia in the setting of CKD.  Status post 1 unit PRBC transfusion with appropriate response.  Patient has a history of hemorrhoids which occasionally bleed --Iron studies and vitamin B12 unremarkable.  Folate level low.  Replace folate. --Aspirin Plavix held --Seen by gastroenterology, no overt blood loss noted.  No endoscopic evaluation recommended urgently.  If does need evaluation, Plavix has to be held for 5 days.  Follow-up with primary gastroenterologist as an outpatient.  Could consider IV iron  Hyponatremia, hypochloremia --Probably from poor oral intake.  We will continue fluids and follow-up BMP in a.m.  Hold Prozac for now.  Chronic hypoxic respiratory failure on 2 L nasal cannula --Appears stable  Chronic combined systolic/diastolic CHF.  LVEF 40% by echocardiogram December 2020.  Followed by Dr. Saralyn Pilar --Appears euvolemic.  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  Continue sliding scale insulin.  Resume  Lantus when diet improves.  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  Continue fluids and close monitoring.  Not yet ready for discharge.  DVT prophylaxis: SCDs Code Status: Full Family Communication: none present    Murray Hodgkins, MD  [facility] Direct contact: see [email or web address] (further directions at bottom of note if needed) 7PM-7AM contact night coverage as at bottom of note 07/03/2019, 2:22 PM  LOS: 1 day   Significant Hospital Events   . 2/19 admitted for AKI, hypotension, anemia.  Gastroenterology consultation.   Consults:  . Gastroenterology   Procedures:  .   Significant Diagnostic Tests:  . SARS-CoV-2 negative . 2/19 chest x-ray no acute disease   Micro Data:  .    Antimicrobials:  .   Interval History/Subjective  Feels better today.  However still somewhat short of breath especially when getting up.  No abdominal pain right now.  Objective   Vitals:  Vitals:   07/03/19 1133 07/03/19 1202  BP: 102/66   Pulse: 70 69  Resp: 18   Temp: 97.6 F (36.4 C)   SpO2: 96% 100%    Exam:  Constitutional.  Appears calm, comfortable. Respiratory.  Clear to auscultation bilaterally.  No wheezes, rales or rhonchi.  Normal respiratory effort. Cardiovascular.  Regular rate and rhythm.  No murmur, rub or gallop.  No lower extremity edema. Abdomen.  Soft, nontender, nondistended. Psychiatric.  Grossly normal mood and affect.  Speech fluent and appropriate.  I have personally reviewed the following:   Today's Data  . CBG stable . Sodium lower today at 128, chloride without significant change in 96. . BUN and creatinine without significant  change.  Creatinine is above baseline. . Hemoglobin improved to 8.8 status post transfusion, slightly lower today at 8.2.  Scheduled Meds: . folic acid  1 mg Oral Daily  . insulin aspart  0-5 Units Subcutaneous QHS  . insulin aspart  0-9 Units Subcutaneous TID WC  . levothyroxine  50 mcg Oral Once per day on Sun Tue Thu Sat  . [START ON 07/05/2019] levothyroxine  75 mcg Oral Once per day on Mon Wed Fri  . mexiletine  200 mg Oral Q12H  . pantoprazole (PROTONIX) IV  40 mg Intravenous Q12H  . ranolazine  500 mg Oral BID  . senna  1 tablet Oral BID  . simvastatin  20 mg Oral QHS   Continuous Infusions: . sodium chloride 50 mL/hr at 07/03/19 1011    Active Problems:   CHF (congestive heart failure), NYHA class III, chronic, combined (HCC)   Anemia   Prolonged Q-T interval on ECG   Chronic respiratory failure with hypoxia ([place])   Hyponatremia   LOS: 1 day   How to contact the [facility] Attending or Consulting provider [place] or covering provider during after hours [place], for this patient?  1. Check the care team in [facility] and look for a) attending/consulting [facility] provider listed and b) the [facility] team listed 2. Log into [email or web address] and use [place]'s universal password to access. If you do not have the password, please contact the hospital operator. 3. Locate the [facility] provider you are looking for under [facility] and page to a number that you can be directly reached. 4. If you still have difficulty reaching the provider, please page the [facility] (Director on Call) for the Hospitalists listed on amion for assistance.

## 2019-07-03 NOTE — Plan of Care (Signed)

## 2019-07-04 DIAGNOSIS — N1832 Chronic kidney disease, stage 3b: Secondary | ICD-10-CM

## 2019-07-04 DIAGNOSIS — D631 Anemia in chronic kidney disease: Secondary | ICD-10-CM

## 2019-07-04 DIAGNOSIS — N189 Chronic kidney disease, unspecified: Secondary | ICD-10-CM

## 2019-07-04 DIAGNOSIS — N183 Chronic kidney disease, stage 3 unspecified: Secondary | ICD-10-CM

## 2019-07-04 LAB — CBC
HCT: 26.3 % — ABNORMAL LOW (ref 36.0–46.0)
Hemoglobin: 8.2 g/dL — ABNORMAL LOW (ref 12.0–15.0)
MCH: 27.9 pg (ref 26.0–34.0)
MCHC: 31.2 g/dL (ref 30.0–36.0)
MCV: 89.5 fL (ref 80.0–100.0)
Platelets: 199 10*3/uL (ref 150–400)
RBC: 2.94 MIL/uL — ABNORMAL LOW (ref 3.87–5.11)
RDW: 18.5 % — ABNORMAL HIGH (ref 11.5–15.5)
WBC: 6.7 10*3/uL (ref 4.0–10.5)
nRBC: 0.3 % — ABNORMAL HIGH (ref 0.0–0.2)

## 2019-07-04 LAB — BASIC METABOLIC PANEL
Anion gap: 12 (ref 5–15)
BUN: 42 mg/dL — ABNORMAL HIGH (ref 8–23)
CO2: 20 mmol/L — ABNORMAL LOW (ref 22–32)
Calcium: 8.5 mg/dL — ABNORMAL LOW (ref 8.9–10.3)
Chloride: 97 mmol/L — ABNORMAL LOW (ref 98–111)
Creatinine, Ser: 2.09 mg/dL — ABNORMAL HIGH (ref 0.44–1.00)
GFR calc Af Amer: 26 mL/min — ABNORMAL LOW (ref >60)
GFR calc non Af Amer: 22 mL/min — ABNORMAL LOW (ref >60)
Glucose, Bld: 99 mg/dL (ref 70–99)
Potassium: 4.3 mmol/L (ref 3.5–5.1)
Sodium: 129 mmol/L — ABNORMAL LOW (ref 135–145)

## 2019-07-04 LAB — GLUCOSE, CAPILLARY
Glucose-Capillary: 107 mg/dL — ABNORMAL HIGH (ref 70–99)
Glucose-Capillary: 121 mg/dL — ABNORMAL HIGH (ref 70–99)
Glucose-Capillary: 122 mg/dL — ABNORMAL HIGH (ref 70–99)
Glucose-Capillary: 140 mg/dL — ABNORMAL HIGH (ref 70–99)

## 2019-07-04 LAB — BRAIN NATRIURETIC PEPTIDE: B Natriuretic Peptide: 483 pg/mL — ABNORMAL HIGH (ref 0.0–100.0)

## 2019-07-04 MED ORDER — SODIUM CHLORIDE 0.9 % IV SOLN: INTRAVENOUS | Status: DC

## 2019-07-04 MED ORDER — TRIAMCINOLONE ACETONIDE 55 MCG/ACT NA AERO
2.0000 | INHALATION_SPRAY | Freq: Two times a day (BID) | NASAL | Status: DC
  Administered 2019-07-04 – 2019-07-05 (×2): 2 via NASAL
  Filled 2019-07-04: qty 21.6

## 2019-07-04 MED ORDER — ZOLPIDEM TARTRATE 5 MG PO TABS
5.0000 mg | ORAL_TABLET | Freq: Every day | ORAL | Status: DC
  Administered 2019-07-04: 5 mg via ORAL
  Filled 2019-07-04: qty 1

## 2019-07-04 NOTE — Plan of Care (Signed)
  Problem: Education: Goal: Knowledge of General Education information will improve Description: Including pain rating scale, medication(s)/side effects and non-pharmacologic comfort measures Outcome: Progressing   Problem: Activity: Goal: Risk for activity intolerance will decrease Outcome: Progressing   Problem: Skin Integrity: Goal: Risk for impaired skin integrity will decrease Outcome: Progressing

## 2019-07-04 NOTE — Consult Note (Signed)
CARDIOLOGY CONSULT NOTE               Patient ID: Heather Peterson MRN: [id number] DOB/AGE: 12-11-1940 79 y.o.  Admit date: 07/02/2019 Referring Physician Dr. Fritzi Mandes hospitalist Primary Physician Dr. Ellison Hughs  primary Primary Cardiologist Dr. Saralyn Pilar  Reason for Consultation dyspnea shortness of breath possible angina  HPI: 79 year old white female with multiple medical problems including chronic combined systolic and diastolic congestive heart failure cardiomyopathy ejection fraction 40% coronary disease depression diabetes hypertension myocardial infarction ventricular tachycardia status post AICD placement obesity hypoxemia COPD who is on 3 L of home O2 who presented with significant shortness of breath fatigue weakness anxiety was found to be significantly anemic with what appears to be lower GI bleeding hemoglobin of 7 usually close to 10.  Patient had some chronic renal sufficiency dehydration hypertension anemia related to chronic lower GI bleeding now presents for further cardiac assessment but she is severely dyspneic without any significant chest pain clearly multifactorial including anemia COPD heart failure obesity hypoxemia.  Cardiology was consulted for further assessment evaluation  Review of systems complete and found to be negative unless listed above     Past Medical History:  Diagnosis Date  . Anxiety   . Chronic combined systolic and diastolic CHF (congestive heart failure) ([place])   . Coronary artery disease   . Depression   . Diabetes mellitus without complication ([place])   . Diabetes mellitus, type II ([place])   . Hypertension   . MI (myocardial infarction) ([place])    x 5  . Pacemaker   . Prolonged Q-T interval on ECG   . Thyroid disease     Past Surgical History:  Procedure Laterality Date  . CENTRAL LINE INSERTION  03/11/2017   Procedure: CENTRAL LINE INSERTION;  Surgeon: Leonie Man, MD;  Location: [place] [facility];  Service:  Cardiovascular;;  . CHOLECYSTECTOMY    . CORONARY STENT INTERVENTION W/IMPELLA N/A 03/11/2017   Procedure: Coronary Stent Intervention w/Impella;  Surgeon: Leonie Man, MD;  Location: [place] [facility];  Service: Cardiovascular;  Laterality: N/A;  . EYE SURGERY    . INTRAVASCULAR PRESSURE WIRE/FFR STUDY N/A 03/11/2017   Procedure: INTRAVASCULAR PRESSURE WIRE/FFR STUDY;  Surgeon: Leonie Man, MD;  Location: [place] [facility];  Service: Cardiovascular;  Laterality: N/A;  . INTRAVASCULAR ULTRASOUND/IVUS N/A 03/11/2017   Procedure: Intravascular Ultrasound/IVUS;  Surgeon: Leonie Man, MD;  Location: [place] [facility];  Service: Cardiovascular;  Laterality: N/A;  . LEFT HEART CATH AND CORONARY ANGIOGRAPHY N/A 03/05/2017   Procedure: LEFT HEART CATH AND CORONARY ANGIOGRAPHY;  Surgeon: Isaias Cowman, MD;  Location: [place] [facility];  Service: Cardiovascular;  Laterality: N/A;  . PACEMAKER IMPLANT    . pacemaker/defibrillator Left     Medications Prior to Admission  Medication Sig Dispense Refill Last Dose  . acetaminophen (TYLENOL) 500 MG tablet Take 500 mg by mouth every 4 (four) hours as needed for mild pain, fever or headache.    Unknown at PRN  . albuterol (PROVENTIL HFA;VENTOLIN HFA) 108 (90 Base) MCG/ACT inhaler Inhale 2 puffs into the lungs every 4 (four) hours as needed for wheezing or shortness of breath. 1 Inhaler 1 Unknown at PRN  . aspirin EC 81 MG tablet Take 81 mg by mouth daily.   07/01/2019 at 0900  . clopidogrel (PLAVIX) 75 MG tablet Take 1 tablet (75 mg total) by mouth daily. 30 tablet 1 07/01/2019 at 0900  . FLUoxetine (PROZAC) 10 MG capsule  Take 10 mg by mouth daily.   07/01/2019 at 0900  . furosemide (LASIX) 40 MG tablet Take 40 mg by mouth daily.    07/01/2019 at 0900  . LANTUS SOLOSTAR 100 UNIT/ML Solostar Pen Inject 16 Units into the skin at bedtime.   07/01/2019 at 2200  . levothyroxine (SYNTHROID) 50 MCG tablet Take 50 mcg by mouth See admin  instructions. Take 1 tablet (86mg) by mouth every Tuesday, Thursday, Saturday and Sunday before breakfast   07/01/2019 at 0800  . levothyroxine (SYNTHROID, LEVOTHROID) 75 MCG tablet Take 75 mcg by mouth See admin instructions. Take 1 tablet (757m) by mouth every Monday, Wednesday and Friday morning before breakfast   06/30/2019 at 0800  . metoprolol succinate (TOPROL-XL) 25 MG 24 hr tablet TAKE 1/2 TABLET BY MOUTH DAILY WITH OR IMMEDIATELY FOLLOWING A MEAL (Patient taking differently: Take 12.5 mg by mouth daily. ) 90 tablet 0 07/01/2019 at 0900  . mexiletine (MEXITIL) 200 MG capsule Take 1 capsule (200 mg total) by mouth every 12 (twelve) hours. 60 capsule 1 07/01/2019 at 2200  . pantoprazole (PROTONIX) 40 MG tablet Take 40 mg by mouth daily.   07/01/2019 at 0900  . potassium chloride SA (K-DUR,KLOR-CON) 20 MEQ tablet Take 1 tablet (20 mEq total) by mouth daily. 30 tablet 1 07/01/2019 at 0900  . ranolazine (RANEXA) 500 MG 12 hr tablet Take 1 tablet (500 mg total) by mouth 2 (two) times daily. 60 tablet 1 07/01/2019 at 2200  . simvastatin (ZOCOR) 40 MG tablet Take 20 mg by mouth at bedtime.   2 07/01/2019 at 2200   Social History   Socioeconomic History  . Marital status: Married    Spouse name: rodney  . Number of children: 2  . Years of education: Not on file  . Highest education level: High school graduate  Occupational History  . Not on file  Tobacco Use  . Smoking status: Current Every Day Smoker    Packs/day: 0.25    Types: E-cigarettes, Cigarettes  . Smokeless tobacco: Never Used  Substance and Sexual Activity  . Alcohol use: Not Currently    Comment: occasionally  . Drug use: No  . Sexual activity: Not Currently  Other Topics Concern  . Not on file  Social History Narrative  . Not on file   Social Determinants of Health   Financial Resource Strain:   . Difficulty of Paying Living Expenses: Not on file  Food Insecurity:   . Worried About RuCharity fundraisern the Last Year:  Not on file  . Ran Out of Food in the Last Year: Not on file  Transportation Needs:   . Lack of Transportation (Medical): Not on file  . Lack of Transportation (Non-Medical): Not on file  Physical Activity:   . Days of Exercise per Week: Not on file  . Minutes of Exercise per Session: Not on file  Stress:   . Feeling of Stress : Not on file  Social Connections:   . Frequency of Communication with Friends and Family: Not on file  . Frequency of Social Gatherings with Friends and Family: Not on file  . Attends Religious Services: Not on file  . Active Member of Clubs or Organizations: Not on file  . Attends ClArchivisteetings: Not on file  . Marital Status: Not on file  Intimate Partner Violence:   . Fear of Current or Ex-Partner: Not on file  . Emotionally Abused: Not on file  . Physically Abused: Not  on file  . Sexually Abused: Not on file    Family History  Problem Relation Age of Onset  . Hypertension Father   . Heart attack Father   . Depression Sister   . Depression Brother   . Depression Brother       Review of systems complete and found to be negative unless listed above      PHYSICAL EXAM  General: Well developed, well nourished, in no acute distress HEENT:  Normocephalic and atramatic Neck:  No JVD.  Lungs: Clear bilaterally to auscultation and percussion. Heart: HRRR . Normal S1 and S2 without gallops or murmurs.  Abdomen: Bowel sounds are positive, abdomen soft and non-tender  Msk:  Back normal, normal gait. Normal strength and tone for age. Extremities: No clubbing, cyanosis or edema.   Neuro: Alert and oriented X 3. Psych:  Good affect, responds appropriately  Labs:   Lab Results  Component Value Date   WBC 6.7 07/04/2019   HGB 8.2 (L) 07/04/2019   HCT 26.3 (L) 07/04/2019   MCV 89.5 07/04/2019   PLT 199 07/04/2019    Recent Labs  Lab 07/04/19 0512  NA 129*  K 4.3  CL 97*  CO2 20*  BUN 42*  CREATININE 2.09*  CALCIUM 8.5*    GLUCOSE 99   Lab Results  Component Value Date   TROPONINI <0.03 09/05/2017   No results found for: CHOL No results found for: HDL No results found for: LDLCALC No results found for: TRIG No results found for: CHOLHDL No results found for: LDLDIRECT    Radiology: DG Chest 1 View  Result Date: 07/02/2019 CLINICAL DATA:  Shortness of breath. Increasing weakness. EXAM: CHEST  1 VIEW COMPARISON:  Radiograph 03/15/2018. FINDINGS: Multi lead left-sided pacemaker. Cardiomegaly with unchanged mediastinal contours. Peribronchial thickening with question of minimal Kerley B-lines at the bases. No evidence of pneumonia. No large pleural effusion. No pneumothorax. IMPRESSION: Stable cardiomegaly.  Probable mild pulmonary edema. Electronically Signed   By: Keith Rake M.D.   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 Plan Agree with admit to telemetry Follow-up EKGs and troponins Agree with follow-up H&H would recommend transfusion to at least 8 or 9 Continue supplemental oxygen therapy at 3 L Maintain inhaler therapy for shortness of breath COPD Hold blood pressure medication because of relative hypotension Consider nephrology input for renal insufficiency Continue diabetes management and control Dyspnea is probably related to anemia with contributing heart failure and COPD Advised patient refrain from tobacco abuse Recommend weight loss exercise portion control Recommend follow-up with pulmonary for COPD hypoxemia Consider evaluation possible obstructive sleep  apnea Defer any invasive cardiac evaluation Continue aggressive medical therapy and supportive care    Signed: Yolonda Kida MD, PHD, [facility] 07/04/2019, 8:08 PM

## 2019-07-04 NOTE — Progress Notes (Addendum)
PROGRESS NOTE  Akina Maish Lahti JQG:[id number] DOB: 12-31-1940 DOA: 07/02/2019 PCP: [name], MD  Brief History   79 year old woman PMH combined systolic and diastolic CHF, CAD status post stent, VF/VT status post ICD placement, prolonged QTC, presented with poor appetite, decreased energy and increased fatigue, shortness of breath for 3 to 4 days.  Poor appetite.  Found to be anemic and to have acute kidney injury.  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  A & P  Acute kidney injury superimposed on CKD stage IIIb, secondary to poor oral intake, dehydration complicated by diuretics.  GI bleed initially considered. --Continue to hold Lasix.  Creatinine is very slowly.  Will give a little more IV fluids today and recheck BMP in a.m.  Acute on chronic anemia in the setting of CKD.  Status post 1 unit PRBC transfusion with appropriate response.  Patient has a history of hemorrhoids which occasionally bleed. Iron studies and vitamin B12 unremarkable. Seen by gastroenterology, no overt blood loss noted.  No endoscopic evaluation recommended urgently.  If does need evaluation, Plavix has to be held for 5 days.  Follow-up with primary gastroenterologist as an outpatient. --Folate level low.  Continue folate. --Aspirin, clopidogrel held but can probably restart in the next 48 hours if CBC remains stable. --Had some blood with mucus with a bowel movement today.  Suspect hemorrhoids.  Follow clinically.  Hyponatremia, hypochloremia --Suspect related to poor oral intake.  Slowly improving.  Continue IV fluids.  Hold Prozac.    Chronic hypoxic respiratory failure on 2 L nasal cannula --Appears stable.  Chronic combined systolic/diastolic CHF.  LVEF 40% by echocardiogram December 2020.  Followed by Dr. 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  Blood sugars well controlled.  Resume Lantus  when diet improves.  Prolonged QTC, chronic --Stable  Hypotension secondary to poor oral intake, dehydration, diuretics --Resolved.  Continue to hold Lasix.  Monitor volume status closely given history of CHF.  For now gentle IV fluids.  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  Blood count remains stable tomorrow, sodium is better and renal function is better.  She might be able to go home.  DVT prophylaxis: SCDs Code Status: Full Family Communication: none present    Murray Hodgkins, MD  [facility] Direct contact: see [email or web address] (further directions at bottom of note if needed) 7PM-7AM contact night coverage as at bottom of note 07/04/2019, 1:20 PM  LOS: 2 days   Significant Hospital Events   . 2/19 admitted for AKI, hypotension, anemia.  Gastroenterology consultation.   Consults:  . Gastroenterology   Procedures:  .   Significant Diagnostic Tests:  . SARS-CoV-2 negative . 2/19 chest x-ray no acute disease   Micro Data:  .    Antimicrobials:  .   Interval History/Subjective  Slept okay.  Short of breath today.  Reports some mucus with blood in her stool.  Does not feel very well.  Reports for 2 weeks she has been quite fatigued with little energy and decreased appetite.  Nothing she can put her finger on.  Objective  Vitals:  Vitals:   07/04/19 0813 07/04/19 1156  BP: 111/62 (!) 119/58  Pulse: 70 70  Resp: 16   Temp: (!) 97.3 F (36.3 C) (!) 97.5 F (36.4 C)  SpO2: 100% 100%    Exam:  Constitutional.  Appears chronically ill, weak, but nontoxic. Psychiatric.  Grossly normal mood and affect.   Speech fluent and appropriate. Cardiovascular.  Regular rate and rhythm.  No murmur, rub or gallop.  No lower extremity edema. Respiratory.  Fair air movement, no frank wheezes, rales or rhonchi.  Mild to moderate increased respiratory effort.  Able to speak in short sentences. Abdomen.  Soft.  I have personally reviewed the following:   Today's Data  . CBG stable . BUN and creatinine trending down, 42 and 2.09.  Sodium slightly better at 128.  Chloride slightly better at 97.  Anion gap within normal limits. . Hemoglobin 8.2.  Stable.  Scheduled Meds: . folic acid  1 mg Oral Daily  . insulin aspart  0-5 Units Subcutaneous QHS  . insulin aspart  0-9 Units Subcutaneous TID WC  . levothyroxine  50 mcg Oral Once per day on Sun Tue Thu Sat  . [START ON 07/05/2019] levothyroxine  75 mcg Oral Once per day on Mon Wed Fri  . mexiletine  200 mg Oral Q12H  . pantoprazole (PROTONIX) IV  40 mg Intravenous Q12H  . ranolazine  500 mg Oral BID  . senna  1 tablet Oral BID  . simvastatin  20 mg Oral QHS   Continuous Infusions: . sodium chloride      Active Problems:   CHF (congestive heart failure), NYHA class III, chronic, combined (HCC)   Diabetes mellitus type 2, uncomplicated (HCC)   Anemia   Prolonged Q-T interval on ECG   Chronic respiratory failure with hypoxia (HCC)   Hyponatremia   Anemia in chronic kidney disease (CKD)   LOS: 2 days   How to contact the [facility] Attending or Consulting provider 7A - 7P or covering provider during after hours [place], for this patient?  1. Check the care team in [facility] and look for a) attending/consulting TR[facility] provider listed and b) the [facility] team listed 2. Log into [email or web address] and use [place]'s universal password to access. If you do not have the password, please contact the hospital operator. 3. Locate the [facility] provider you are looking for under [facility] and page to a number that you can be directly reached. 4. If you still have difficulty  reaching the provider, please page the [facility] (Director on Call) for the Hospitalists listed on amion for assistance.

## 2019-07-05 DIAGNOSIS — D631 Anemia in chronic kidney disease: Secondary | ICD-10-CM

## 2019-07-05 DIAGNOSIS — Z794 Long term (current) use of insulin: Secondary | ICD-10-CM

## 2019-07-05 DIAGNOSIS — E119 Type 2 diabetes mellitus without complications: Secondary | ICD-10-CM

## 2019-07-05 DIAGNOSIS — E860 Dehydration: Secondary | ICD-10-CM

## 2019-07-05 DIAGNOSIS — D509 Iron deficiency anemia, unspecified: Secondary | ICD-10-CM

## 2019-07-05 DIAGNOSIS — D529 Folate deficiency anemia, unspecified: Secondary | ICD-10-CM

## 2019-07-05 LAB — CBC
HCT: 24.1 % — ABNORMAL LOW (ref 36.0–46.0)
Hemoglobin: 7.6 g/dL — ABNORMAL LOW (ref 12.0–15.0)
MCH: 27.8 pg (ref 26.0–34.0)
MCHC: 31.5 g/dL (ref 30.0–36.0)
MCV: 88.3 fL (ref 80.0–100.0)
Platelets: 214 10*3/uL (ref 150–400)
RBC: 2.73 MIL/uL — ABNORMAL LOW (ref 3.87–5.11)
RDW: 18.2 % — ABNORMAL HIGH (ref 11.5–15.5)
WBC: 6.2 10*3/uL (ref 4.0–10.5)
nRBC: 0.3 % — ABNORMAL HIGH (ref 0.0–0.2)

## 2019-07-05 LAB — BASIC METABOLIC PANEL
Anion gap: 8 (ref 5–15)
BUN: 30 mg/dL — ABNORMAL HIGH (ref 8–23)
CO2: 24 mmol/L (ref 22–32)
Calcium: 8.6 mg/dL — ABNORMAL LOW (ref 8.9–10.3)
Chloride: 96 mmol/L — ABNORMAL LOW (ref 98–111)
Creatinine, Ser: 1.81 mg/dL — ABNORMAL HIGH (ref 0.44–1.00)
GFR calc Af Amer: 31 mL/min — ABNORMAL LOW (ref >60)
GFR calc non Af Amer: 27 mL/min — ABNORMAL LOW (ref >60)
Glucose, Bld: 118 mg/dL — ABNORMAL HIGH (ref 70–99)
Potassium: 4.3 mmol/L (ref 3.5–5.1)
Sodium: 128 mmol/L — ABNORMAL LOW (ref 135–145)

## 2019-07-05 LAB — GLUCOSE, CAPILLARY: Glucose-Capillary: 107 mg/dL — ABNORMAL HIGH (ref 70–99)

## 2019-07-05 MED ORDER — FOLIC ACID 1 MG PO TABS: 1.0000 mg | ORAL_TABLET | Freq: Every day | ORAL | 0 refills | Status: DC

## 2019-07-05 MED ORDER — FUROSEMIDE 20 MG PO TABS: 20.0000 mg | ORAL_TABLET | Freq: Every day | ORAL | 0 refills | Status: DC | PRN

## 2019-07-05 MED ORDER — SALINE SPRAY 0.65 % NA SOLN: 1.0000 | NASAL | 0 refills | Status: DC | PRN

## 2019-07-05 MED ORDER — TRIAMCINOLONE ACETONIDE 55 MCG/ACT NA AERO: 2.0000 | INHALATION_SPRAY | Freq: Two times a day (BID) | NASAL | 0 refills | Status: DC

## 2019-07-05 MED ORDER — POTASSIUM CHLORIDE CRYS ER 20 MEQ PO TBCR: 20.0000 meq | EXTENDED_RELEASE_TABLET | Freq: Every day | ORAL | 0 refills | Status: DC | PRN

## 2019-07-05 NOTE — Discharge Summary (Signed)
Heather Peterson at [place] NAME: Heather Peterson    MR#:  [id number]  DATE OF BIRTH:  [birth year]  DATE OF ADMISSION:  07/02/2019 ADMITTING PHYSICIAN: Heather Mandes, MD  DATE OF DISCHARGE: 07/05/19  PRIMARY CARE PHYSICIAN: Heather Hartigan, MD    ADMISSION DIAGNOSIS:  Dehydration [E86.0] Weakness [R53.1] AKI (acute kidney injury) ([place]) [N17.9] Anemia, unspecified type [D64.9] Acute renal failure superimposed on stage 3 chronic kidney disease, unspecified acute renal failure type, unspecified whether stage 3a or 3b CKD ([place]) [N17.9, N18.30]  DISCHARGE DIAGNOSIS:  acute on chronic kidney disease stage IIIB suspected due to dehydration complicated by diuretic acute on chronic anemia the setting of CKD stage III-- status post blood transfusion chronic combined systolic diastolic CHF diabetes type II with CKD stage IIIB chronic prolonged QTC Hypotension-- patient asymptomatic, holding beta-blockers SECONDARY DIAGNOSIS:   Past Medical History:  Diagnosis Date  . Anxiety   . Chronic combined systolic and diastolic CHF (congestive heart failure) ([place])   . Coronary artery disease   . Depression   . Diabetes mellitus without complication ([place])   . Diabetes mellitus, type II ([place])   . Hypertension   . MI (myocardial infarction) ([place])    x 5  . Pacemaker   . Prolonged Q-T interval on ECG   . Thyroid disease     HOSPITAL COURSE:  79 year old woman PMH combined systolic and diastolic CHF, CAD status post stent, VF/VT status post ICD placement, prolonged QTC, presented with poor appetite, decreased energy and increased fatigue, shortness of breath for 3 to 4 days.  Poor appetite.  Found to be anemic and to have acute kidney injury.  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  Acute kidney injury superimposed on CKD stage IIIb, secondary to poor oral intake, dehydration complicated by diuretics.  GI bleed initially  considered. --Continue to hold Lasix. Advised pt to take prn for increasing sob and leg swelling. Same with K dur -creat 1.8    Acute on chronic anemia in the setting of CKD.  Status post 1 unit PRBC transfusion with appropriate response.  Patient has a history of hemorrhoids which occasionally bleed. Iron studies and vitamin B12 unremarkable. Seen by gastroenterology, no overt blood loss noted.  No endoscopic evaluation recommended urgently.  If does need evaluation, Plavix has to be held for 5 days.  Follow-up with primary gastroenterologist as an outpatient-Heather London, NP --Folate level low.  Continue folate. --Aspirin, clopidogrel held during hospital stay but will resume it now  --no active bleed.  Hyponatremia, hypochloremia --Suspect related to poor oral intake.  Slowly improving.   Hold Prozac.    Chronic hypoxic respiratory failure on 2 L nasal cannula --Appears stable.  Chronic combined systolic/diastolic CHF.  LVEF 40% by echocardiogram December 2020.  Followed by Heather. 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   --Blood sugars well controlled.   --Resume Lantus at discharge  Prolonged QTC, chronic --Stable  Hypotension secondary to poor oral intake, dehydration, diuretics --Resolved.  Continue to hold Lasix.  Monitor volume status closely given history of CHF.  -pt is advised to hold metoprolol for now (per Heather Peterson)  Overall she feels better. She is wanting to go home. She is best at baseline at [facility]  Patient will follow-up with [facility] for her CKD stage IIIB. She is appointment March 9. Should follow-up with G.I.  and cardiology at [facility] on her appointments and primary care physician.  Disposition Plan:  From: Home with husband and daughter who assist in care with HHPT and RN   CONSULTS OBTAINED:  Treatment  Team:  Heather Bellows, MD Heather Kida, MD  DRUG ALLERGIES:   Allergies  Allergen Reactions  . Celebrex [Celecoxib] Anaphylaxis  . Glipizide Anaphylaxis  . Lisinopril Swelling  . Sulfa Antibiotics Other (See Comments) and Anaphylaxis    Reaction: unknown  . Levaquin [Levofloxacin In D5w] Other (See Comments)    Heart arrhthymias  . Levofloxacin Other (See Comments)    ICD fired  . Metformin Other (See Comments)    Gi tolerance   . Penicillins Rash and Other (See Comments)    Has patient had a PCN reaction causing immediate rash, facial/tongue/throat swelling, SOB or lightheadedness with hypotension: Unknown Has patient had a PCN reaction causing severe rash involving mucus membranes or skin necrosis: No Has patient had a PCN reaction that required hospitalization: No Has patient had a PCN reaction occurring within the last 10 years: No If all of the above answers are "NO", then may proceed with Cephalosporin use.     DISCHARGE MEDICATIONS:   Allergies as of 07/05/2019      Reactions   Celebrex [celecoxib] Anaphylaxis   Glipizide Anaphylaxis   Lisinopril Swelling   Sulfa Antibiotics Other (See Comments), Anaphylaxis   Reaction: unknown   Levaquin [levofloxacin In D5w] Other (See Comments)   Heart arrhthymias   Levofloxacin Other (See Comments)   ICD fired   Metformin Other (See Comments)   Gi tolerance    Penicillins Rash, Other (See Comments)   Has patient had a PCN reaction causing immediate rash, facial/tongue/throat swelling, SOB or lightheadedness with hypotension: Unknown Has patient had a PCN reaction causing severe rash involving mucus membranes or skin necrosis: No Has patient had a PCN reaction that required hospitalization: No Has patient had a PCN reaction occurring within the last 10 years: No If all of the above answers are "NO", then may proceed with Cephalosporin use.      Medication List    STOP taking these medications   metoprolol succinate 25  MG 24 hr tablet Commonly known as: TOPROL-XL     TAKE these medications   acetaminophen 500 MG tablet Commonly known as: TYLENOL Take 500 mg by mouth every 4 (four) hours as needed for mild pain, fever or headache.   albuterol 108 (90 Base) MCG/ACT inhaler Commonly known as: VENTOLIN HFA Inhale 2 puffs into the lungs every 4 (four) hours as needed for wheezing or shortness of breath.   aspirin EC 81 MG tablet Take 81 mg by mouth daily.   clopidogrel 75 MG tablet Commonly known as: PLAVIX Take 1 tablet (75 mg total) by mouth daily.   FLUoxetine 10 MG capsule Commonly known as: PROZAC Take 10 mg by mouth daily.   folic acid 1 MG tablet Commonly known as: FOLVITE Take 1 tablet (1 mg total) by mouth daily. Start taking on: July 06, 2019   furosemide 20 MG tablet Commonly known as: LASIX Take 1 tablet (20 mg total) by mouth daily as needed for fluid or edema. For leg swelling for if your weight goes over 5 lbs from baseline What changed:   medication strength  how much to take  when to take this  reasons to take this  additional instructions   Lantus SoloStar 100 UNIT/ML Solostar Pen Generic drug: Insulin Glargine Inject 16 Units  into the skin at bedtime.   levothyroxine 75 MCG tablet Commonly known as: SYNTHROID Take 75 mcg by mouth See admin instructions. Take 1 tablet (38mg) by mouth every Monday, Wednesday and Friday morning before breakfast   levothyroxine 50 MCG tablet Commonly known as: SYNTHROID Take 50 mcg by mouth See admin instructions. Take 1 tablet (581m) by mouth every Tuesday, Thursday, Saturday and Sunday before breakfast   mexiletine 200 MG capsule Commonly known as: MEXITIL Take 1 capsule (200 mg total) by mouth every 12 (twelve) hours.   pantoprazole 40 MG tablet Commonly known as: PROTONIX Take 40 mg by mouth daily.   potassium chloride SA 20 MEQ tablet Commonly known as: KLOR-CON Take 1 tablet (20 mEq total) by mouth daily as  needed. When yo take lasix What changed:   when to take this  reasons to take this  additional instructions   ranolazine 500 MG 12 hr tablet Commonly known as: RANEXA Take 1 tablet (500 mg total) by mouth 2 (two) times daily.   simvastatin 40 MG tablet Commonly known as: ZOCOR Take 20 mg by mouth at bedtime.   sodium chloride 0.65 % Soln nasal spray Commonly known as: OCEAN Place 1 spray into both nostrils as needed for congestion.   triamcinolone 55 MCG/ACT Aero nasal inhaler Commonly known as: NASACORT Place 2 sprays into the nose 2 (two) times daily.       If you experience worsening of your admission symptoms, develop shortness of breath, life threatening emergency, suicidal or homicidal thoughts you must seek medical attention immediately by calling 911 or calling your MD immediately  if symptoms less severe.  You Must read complete instructions/literature along with all the possible adverse reactions/side effects for all the Medicines you take and that have been prescribed to you. Take any new Medicines after you have completely understood and accept all the possible adverse reactions/side effects.   Please note  You were cared for by a hospitalist during your hospital stay. If you have any questions about your discharge medications or the care you received while you were in the hospital after you are discharged, you can call the unit and asked to speak with the hospitalist on call if the hospitalist that took care of you is not available. Once you are discharged, your primary care physician will handle any further medical issues. Please note that NO REFILLS for any discharge medications will be authorized once you are discharged, as it is imperative that you return to your primary care physician (or establish a relationship with a primary care physician if you do not have one) for your aftercare needs so that they can reassess your need for medications and monitor your lab  values. Today   SUBJECTIVE   I feel better than I came in with. I would like to go home. Patient sitting out in the chair. Denies chest pain.  VITAL SIGNS:  Blood pressure (!) 96/49, pulse 75, temperature (!) 97.5 F (36.4 C), temperature source Oral, resp. rate 20, height 5' 4"  (1.626 m), weight 87.7 kg, SpO2 100 %.  I/O:    Intake/Output Summary (Last 24 hours) at 07/05/2019 1352 Last data filed at 07/05/2019 1315 Gross per 24 hour  Intake 360 ml  Output 1750 ml  Net -1390 ml    PHYSICAL EXAMINATION:  GENERAL:  [age].o.-year-old patient lying in the bed with no acute distress. Pallor+ EYES: Pupils equal, round, reactive to light and accommodation. No scleral icterus.  HEENT: Head atraumatic, normocephalic.  Oropharynx and nasopharynx clear.  NECK:  Supple, no jugular venous distention. No thyroid enlargement, no tenderness.  LUNGS: decreasedbreath sounds bilaterally, no wheezing, rales,rhonchi or crepitation. No use of accessory muscles of respiration.  CARDIOVASCULAR: S1, S2 normal. No murmurs, rubs, or gallops.  ABDOMEN: Soft, non-tender, non-distended. Bowel sounds present. No organomegaly or mass.  EXTREMITIES: No pedal edema, cyanosis, or clubbing.  NEUROLOGIC: Cranial nerves II through XII are intact. Muscle strength 5/5 in all extremities. Sensation intact. Gait not checked. weak PSYCHIATRIC: The patient is alert and oriented x 3.  SKIN: No obvious rash, lesion, or ulcer.   DATA REVIEW:   CBC  Recent Labs  Lab 07/05/19 0505  WBC 6.2  HGB 7.6*  HCT 24.1*  PLT 214    Chemistries  Recent Labs  Lab 07/05/19 0505  NA 128*  K 4.3  CL 96*  CO2 24  GLUCOSE 118*  BUN 30*  CREATININE 1.81*  CALCIUM 8.6*    Microbiology Results   Recent Results (from the past 240 hour(s))  SARS CORONAVIRUS 2 (TAT 6-24 HRS) Nasopharyngeal Nasopharyngeal Swab     Status: None   Collection Time: 07/02/19  8:36 AM   Specimen: Nasopharyngeal Swab  Result Value Ref Range Status    SARS Coronavirus 2 NEGATIVE NEGATIVE Final    Comment: (NOTE) SARS-CoV-2 target nucleic acids are NOT DETECTED. The SARS-CoV-2 RNA is generally detectable in upper and lower respiratory specimens during the acute phase of infection. Negative results do not preclude SARS-CoV-2 infection, do not rule out co-infections with other pathogens, and should not be used as the sole basis for treatment or other patient management decisions. Negative results must be combined with clinical observations, patient history, and epidemiological information. The expected result is Negative. Fact Sheet for Patients: SugarRoll.be Fact Sheet for Healthcare Providers: https://www.woods-mathews.com/ This test is not yet approved or cleared by the [place] FDA and  has been authorized for detection and/or diagnosis of SARS-CoV-2 by FDA under an Emergency Use Authorization (EUA). This EUA will remain  in effect (meaning this test can be used) for the duration of the COVID-19 declaration under Section 56 4(b)(1) of the Act, 21 U.S.C. section 360bbb-3(b)(1), unless the authorization is terminated or revoked sooner. Performed at [facility], [place] [address] [place], [place] [address]     RADIOLOGY:  No results found.   CODE STATUS:     Code Status Orders  (From admission, onward)         Start     Ordered   07/02/19 0939  Full code  Continuous     07/02/19 0939        Code Status History    Date Active Date Inactive Code Status Order ID Comments User Context   03/06/2017 1839 03/14/2017 1642 Full Code [id number]  Daune Perch, NP Inpatient   03/05/2017 0122 03/06/2017 1734 Full Code [id number]  Lance Coon, MD ED   02/04/2017 2340 02/05/2017 2131 Full Code [id number]  Lance Coon, MD ED   Advance Care Planning Activity    Advance Directive Documentation     Most Recent Value  Type of Advance Directive  Healthcare Power of Attorney   Pre-existing out of facility DNR order (yellow form or pink MOST form)  --  "MOST" Form in Place?  --       TOTAL TIME TAKING CARE OF THIS PATIENT: *40* minutes.    Heather Peterson M.D  [facility]    CC: Primary care physician; Thereasa Distance  E, MD

## 2019-07-05 NOTE — Progress Notes (Signed)
[facility] Cardiology    SUBJECTIVE: Patient feels much improved no significant chest pain improved shortness of breath no leg swelling.  No significant coughing energy is improved since transfusion no evidence of bleeding.  Denies palpitations or tachycardia   Vitals:   07/04/19 1936 07/04/19 2337 07/05/19 0328 07/05/19 0754  BP: 120/64 (!) 119/59 (!) 101/50 119/72  Pulse: 72 80 75 78  Resp: 20 20 20 18   Temp: (!) 97.4 F (36.3 C) 97.7 F (36.5 C) (!) 97.5 F (36.4 C) 97.7 F (36.5 C)  TempSrc: Oral Oral Oral Oral  SpO2: 94% 96% 100% 98%  Weight:      Height:         Intake/Output Summary (Last 24 hours) at 07/05/2019 2671 Last data filed at 07/05/2019 0600 Gross per 24 hour  Intake --  Output 1350 ml  Net -1350 ml      PHYSICAL EXAM  General: Well developed, well nourished, in no acute distress HEENT:  Normocephalic and atramatic Neck:  No JVD.  Lungs: Clear bilaterally to auscultation and percussion. Heart: HRRR . Normal S1 and S2 without gallops or murmurs.  Abdomen: Bowel sounds are positive, abdomen soft and non-tender  Msk:  Back normal, normal gait. Normal strength and tone for age. Extremities: No clubbing, cyanosis or edema.   Neuro: Alert and oriented X 3. Psych:  Good affect, responds appropriately   LABS: Basic Metabolic Panel: Recent Labs    07/04/19 0512 07/05/19 0505  NA 129* 128*  K 4.3 4.3  CL 97* 96*  CO2 20* 24  GLUCOSE 99 118*  BUN 42* 30*  CREATININE 2.09* 1.81*  CALCIUM 8.5* 8.6*   Liver Function Tests: No results for input(s): AST, ALT, ALKPHOS, BILITOT, PROT, ALBUMIN in the last 72 hours. No results for input(s): LIPASE, AMYLASE in the last 72 hours. CBC: Recent Labs    07/04/19 0512 07/05/19 0505  WBC 6.7 6.2  HGB 8.2* 7.6*  HCT 26.3* 24.1*  MCV 89.5 88.3  PLT 199 214   Cardiac Enzymes: No results for input(s): CKTOTAL, CKMB, CKMBINDEX, TROPONINI in the last 72 hours. BNP: Invalid input(s): POCBNP D-Dimer: No results  for input(s): DDIMER in the last 72 hours. Hemoglobin A1C: No results for input(s): HGBA1C in the last 72 hours. Fasting Lipid Panel: No results for input(s): CHOL, HDL, LDLCALC, TRIG, CHOLHDL, LDLDIRECT in the last 72 hours. Thyroid Function Tests: No results for input(s): TSH, T4TOTAL, T3FREE, THYROIDAB in the last 72 hours.  Invalid input(s): FREET3 Anemia Panel: No results for input(s): VITAMINB12, FOLATE, FERRITIN, TIBC, IRON, RETICCTPCT in the last 72 hours.  No results found.   Echo mild to moderate reduced left ventricular function EF around 40%  TELEMETRY: Normal sinus rhythm nonspecific ST-T wave changes  ASSESSMENT AND PLAN:  Active Problems:   CHF (congestive heart failure), NYHA class III, chronic, combined (HCC)   Diabetes mellitus type 2, uncomplicated (HCC)   Anemia   Prolonged Q-T interval on ECG   Chronic respiratory failure with hypoxia (HCC)   Hyponatremia   Anemia in chronic kidney disease (CKD)   CKD (chronic kidney disease), stage III  Dyspnea COPD . Plan Agree with Inhalers for COPD History of CHF d/S dysfunction CM EF=40% DM Mild hypoNa A/CRI III . Plan Continue inhalers for SOB/COPD Recommend nephrology for RI Hold blood pressure medications because of hypotension Increase activity up in out of bed to chair Continue conservative therapy Agree with transfusion for anemia Do not recommend invasive strategy at this point Follow-up  with cardiology as an outpatient      Yolonda Kida, MD 07/05/2019 9:14 AM

## 2019-07-05 NOTE — Progress Notes (Signed)
Cephas Darby, MD [address]  [place], [place] [address]  Main: [phone]  Fax: [phone] Pager: [phone]   Subjective: Patient reports that her shortness of breath is back to baseline.  She is on home oxygen at 2 L.  She is no longer experiencing rectal bleeding.  She does not have any GI concerns/symptoms today   Objective: Vital signs in last 24 hours: Vitals:   07/04/19 2337 07/05/19 0328 07/05/19 0754 07/05/19 1201  BP: (!) 119/59 (!) 101/50 119/72 (!) 96/49  Pulse: 80 75 78 75  Resp: 20 20 18 20   Temp: 97.7 F (36.5 C) (!) 97.5 F (36.4 C) 97.7 F (36.5 C) (!) 97.5 F (36.4 C)  TempSrc: Oral Oral Oral Oral  SpO2: 96% 100% 98% 100%  Weight:      Height:       Weight change:   Intake/Output Summary (Last 24 hours) at 07/05/2019 1833 Last data filed at 07/05/2019 1315 Gross per 24 hour  Intake 360 ml  Output 1750 ml  Net -1390 ml     Exam: Heart:: Regular rate and rhythm, S1S2 present or without murmur or extra heart sounds Lungs: normal and clear to auscultation Abdomen: soft, nontender, normal bowel sounds   Lab Results: CBC Latest Ref Rng & Units 07/05/2019 07/04/2019 07/03/2019  WBC 4.0 - 10.5 K/uL 6.2 6.7 7.4  Hemoglobin 12.0 - 15.0 g/dL 7.6(L) 8.2(L) 8.2(L)  Hematocrit 36.0 - 46.0 % 24.1(L) 26.3(L) 25.2(L)  Platelets 150 - 400 K/uL 214 199 214   CMP Latest Ref Rng & Units 07/05/2019 07/04/2019 07/03/2019  Glucose 70 - 99 mg/dL 118(H) 99 119(H)  BUN 8 - 23 mg/dL 30(H) 42(H) 49(H)  Creatinine 0.44 - 1.00 mg/dL 1.81(H) 2.09(H) 2.32(H)  Sodium 135 - 145 mmol/L 128(L) 129(L) 128(L)  Potassium 3.5 - 5.1 mmol/L 4.3 4.3 4.9  Chloride 98 - 111 mmol/L 96(L) 97(L) 96(L)  CO2 22 - 32 mmol/L 24 20(L) 21(L)  Calcium 8.9 - 10.3 mg/dL 8.6(L) 8.5(L) 8.4(L)  Total Protein 6.5 - 8.1 g/dL - - -  Total Bilirubin 0.3 - 1.2 mg/dL - - -  Alkaline Phos 38 - 126 U/L - - -  AST 15 - 41 U/L - - -  ALT 14 - 54 U/L - - -    Micro  Results: Recent Results (from the past 240 hour(s))  SARS CORONAVIRUS 2 (TAT 6-24 HRS) Nasopharyngeal Nasopharyngeal Swab     Status: None   Collection Time: 07/02/19  8:36 AM   Specimen: Nasopharyngeal Swab  Result Value Ref Range Status   SARS Coronavirus 2 NEGATIVE NEGATIVE Final    Comment: (NOTE) SARS-CoV-2 target nucleic acids are NOT DETECTED. The SARS-CoV-2 RNA is generally detectable in upper and lower respiratory specimens during the acute phase of infection. Negative results do not preclude SARS-CoV-2 infection, do not rule out co-infections with other pathogens, and should not be used as the sole basis for treatment or other patient management decisions. Negative results must be combined with clinical observations, patient history, and epidemiological information. The expected result is Negative. Fact Sheet for Patients: SugarRoll.be Fact Sheet for Healthcare Providers: https://www.woods-mathews.com/ This test is not yet approved or cleared by the [place] FDA and  has been authorized for detection and/or diagnosis of SARS-CoV-2 by FDA under an Emergency Use Authorization (EUA). This EUA will remain  in effect (meaning this test can be used) for the duration of the COVID-19 declaration under Section 56 4(b)(1) of  the Act, 21 U.S.C. section 360bbb-3(b)(1), unless the authorization is terminated or revoked sooner. Performed at [facility], [place] [address], [place], [place] [address]    Studies/Results: No results found. Medications:  I have reviewed the patient's current medications. Prior to Admission:  No medications prior to admission.   Scheduled: . folic acid  1 mg Oral Daily  . insulin aspart  0-5 Units Subcutaneous QHS  . insulin aspart  0-9 Units Subcutaneous TID WC  . levothyroxine  50 mcg Oral Once per day on Sun Tue Thu Sat  . levothyroxine  75 mcg Oral Once per day on Mon Wed Fri  . mexiletine  200  mg Oral Q12H  . pantoprazole (PROTONIX) IV  40 mg Intravenous Q12H  . ranolazine  500 mg Oral BID  . senna  1 tablet Oral BID  . simvastatin  20 mg Oral QHS  . triamcinolone  2 spray Each Nare BID  . zolpidem  5 mg Oral QHS   Continuous:  DZH:[id number] **OR** acetaminophen, albuterol, guaiFENesin-dextromethorphan, polyethylene glycol, prochlorperazine, sodium chloride Anti-infectives (From admission, onward)   None     Scheduled Meds: . folic acid  1 mg Oral Daily  . insulin aspart  0-5 Units Subcutaneous QHS  . insulin aspart  0-9 Units Subcutaneous TID WC  . levothyroxine  50 mcg Oral Once per day on Sun Tue Thu Sat  . levothyroxine  75 mcg Oral Once per day on Mon Wed Fri  . mexiletine  200 mg Oral Q12H  . pantoprazole (PROTONIX) IV  40 mg Intravenous Q12H  . ranolazine  500 mg Oral BID  . senna  1 tablet Oral BID  . simvastatin  20 mg Oral QHS  . triamcinolone  2 spray Each Nare BID  . zolpidem  5 mg Oral QHS   Continuous Infusions: PRN Meds:.acetaminophen **OR** acetaminophen, albuterol, guaiFENesin-dextromethorphan, polyethylene glycol, prochlorperazine, sodium chloride   Assessment: Active Problems:   CHF (congestive heart failure), NYHA class III, chronic, combined (HCC)   Diabetes mellitus type 2, uncomplicated (HCC)   Anemia   Prolonged Q-T interval on ECG   Chronic respiratory failure with hypoxia (HCC)   Hyponatremia   Anemia in chronic kidney disease (CKD)   CKD (chronic kidney disease), stage III   Dehydration  Plan: Acute on chronic anemia: Patient does have iron deficiency and folate deficiency likely contributing to anemia.  Recommend iron and folate replacement No evidence of active GI bleed Patient needs bidirectional endoscopy for further evaluation of iron deficiency anemia, she will follow-up with [facility] gastroenterology this week Patient is being discharged home today    LOS: 3 days   Mert Dietrick 07/05/2019, 6:33 PM

## 2019-07-05 NOTE — TOC Progression Note (Signed)
Transition of Care [facility]) - Progression Note    Patient Details  Name: AMELLIA PANIK MRN: [id number] Date of Birth: 06-10-1940  [facility]) CM/SW Contact  Shelbie Ammons, RN Phone Number: 07/05/2019, 8:40 AM  Clinical Narrative:   RNCM placed call to [facility] with [facility] for home health services, they will be able to accept patient.     Expected Discharge Plan: [place]    Expected Discharge Plan and Services Expected Discharge Plan: [place]       Living arrangements for the past 2 months: Single Family Home                                       Social Determinants of Health (SDOH) Interventions    Readmission Risk Interventions No flowsheet data found.

## 2019-07-05 NOTE — Discharge Instructions (Signed)
Patient advised to keep follow-up appointment with [facility] on March 9.  She wants to reschedule her appointment with [facility] as outpatient. Daughter will take care of it. Hold metoprolol your blood pressure medicine for now. Keep log of blood pressure reading at home. Follow-up with Dr. Josefa Half as out pt  Lasix and KCL are as needed for now

## 2019-07-05 NOTE — Care Management Important Message (Signed)
Important Message  Patient Details  Name: Heather [facility] MRN: [id number] Date of Birth: Jul 25, 1940   Medicare Important Message Given:  Yes     Dannette Barbara 07/05/2019, 1:26 PM

## 2019-07-06 ENCOUNTER — Telehealth: Payer: Self-pay

## 2019-07-06 NOTE — Telephone Encounter (Signed)
-----   Message from Lin Landsman, MD sent at 07/05/2019  6:35 PM EST ----- Please inform patient that she has folic acid deficiency.  She should start taking 1 mg folic acid tablet which is over-the-counter, once daily  Thanks RV

## 2019-07-06 NOTE — Telephone Encounter (Signed)
Tried to call patient but mailbox is full

## 2019-07-06 NOTE — Telephone Encounter (Signed)
Called and left a message for call back  

## 2019-07-07 NOTE — Telephone Encounter (Signed)
Patient states she just got out of the hospital and the hospital added folic acid to her medication list at discharged

## 2019-07-13 NOTE — Progress Notes (Signed)
Patient ID: [facility], female    DOB: 01/25/1941, 79 y.o.   MRN: [id number]  HPI  Heather Peterson is a 79 y/o female with a history of CAD, DM, HTN, CKD, thyroid disease, anxiety, depression, prolonged QT, current tobacco use and chronic heart failure.   Echo report from 04/20/2019 reviewed and showed an EF of 40% along with mild TR and moderate MR.  Catheterization done 03/11/17 showed:  Ost LM lesion, 50 %stenosed. - Eccentric Heavily Calcified lesion - MLA 7.5 mm2, FFR 0.93 - Not physiologically significant.  Prox LAD to Dist LAD Stented Segment, 0 %stenosed.  Dist LAD lesion, 90 %stenosed. Beyond Stent - Med Rx.  Prox Cx to Mid Cx Stent, 0 %stenosed.  Ost 2nd Mrg to 2nd Mrg lesion, 50 %stenosed. Beyond Stent  Admitted 07/02/19 due to acute kidney injury along with anemia. Cardiology and GI consults obtained. Diuretic held due to dehydration. Given 1 unit of PRBC's. Chronic prolonged QTc. Discharged after 3 days.   She presents today for her initial visit with a chief complaint of moderate shortness of breath upon minimal exertion. She describes this as chronic in nature but does feel like it's worsened over the last few days. She has associated fatigue, abdominal distention, slight weight gain and difficulty sleeping along with this. She denies any dizziness, palpitations, pedal edema, chest pain or cough.   Says that she's no longer taking furosemide/ potassium and has been placed on spironolactone by her PCP.   Past Medical History:  Diagnosis Date  . Anxiety   . Chronic combined systolic and diastolic CHF (congestive heart failure) ([place])   . Chronic kidney disease   . Coronary artery disease   . Depression   . Diabetes mellitus without complication ([place])   . Diabetes mellitus, type II ([place])   . Hypertension   . MI (myocardial infarction) ([place])    x 5  . Pacemaker   . Prolonged Q-T interval on ECG   . Thyroid disease    Past Surgical History:  Procedure Laterality  Date  . CENTRAL LINE INSERTION  03/11/2017   Procedure: CENTRAL LINE INSERTION;  Surgeon: Leonie Man, MD;  Location: [place] [facility];  Service: Cardiovascular;;  . CHOLECYSTECTOMY    . CORONARY STENT INTERVENTION W/IMPELLA N/A 03/11/2017   Procedure: Coronary Stent Intervention w/Impella;  Surgeon: Leonie Man, MD;  Location: [place] [facility];  Service: Cardiovascular;  Laterality: N/A;  . EYE SURGERY    . INTRAVASCULAR PRESSURE WIRE/FFR STUDY N/A 03/11/2017   Procedure: INTRAVASCULAR PRESSURE WIRE/FFR STUDY;  Surgeon: Leonie Man, MD;  Location: [place] [facility];  Service: Cardiovascular;  Laterality: N/A;  . INTRAVASCULAR ULTRASOUND/IVUS N/A 03/11/2017   Procedure: Intravascular Ultrasound/IVUS;  Surgeon: Leonie Man, MD;  Location: [place] [facility];  Service: Cardiovascular;  Laterality: N/A;  . LEFT HEART CATH AND CORONARY ANGIOGRAPHY N/A 03/05/2017   Procedure: LEFT HEART CATH AND CORONARY ANGIOGRAPHY;  Surgeon: Isaias Cowman, MD;  Location: [place] [facility];  Service: Cardiovascular;  Laterality: N/A;  . PACEMAKER IMPLANT    . pacemaker/defibrillator Left    Family History  Problem Relation Age of Onset  . Hypertension Father   . Heart attack Father   . Depression Sister   . Depression Brother   . Depression Brother    Social History   Tobacco Use  . Smoking status: Current Every Day Smoker    Packs/day: 0.25    Types: E-cigarettes, Cigarettes  . Smokeless  tobacco: Never Used  Substance Use Topics  . Alcohol use: Not Currently    Comment: occasionally   Allergies  Allergen Reactions  . Celebrex [Celecoxib] Anaphylaxis  . Glipizide Anaphylaxis  . Lisinopril Swelling  . Sulfa Antibiotics Other (See Comments) and Anaphylaxis    Reaction: unknown  . Levaquin [Levofloxacin In D5w] Other (See Comments)    Heart arrhthymias  . Levofloxacin Other (See Comments)    ICD fired  . Metformin Other (See Comments)    Gi tolerance   .  Penicillins Rash and Other (See Comments)    Has patient had a PCN reaction causing immediate rash, facial/tongue/throat swelling, SOB or lightheadedness with hypotension: Unknown Has patient had a PCN reaction causing severe rash involving mucus membranes or skin necrosis: No Has patient had a PCN reaction that required hospitalization: No Has patient had a PCN reaction occurring within the last 10 years: No If all of the above answers are "NO", then may proceed with Cephalosporin use.    Prior to Admission medications   Medication Sig Start Date End Date Taking? Authorizing Provider  acetaminophen (TYLENOL) 500 MG tablet Take 500 mg by mouth every 4 (four) hours as needed for mild pain, fever or headache.    Yes [provider]  albuterol (PROVENTIL HFA;VENTOLIN HFA) 108 (90 Base) MCG/ACT inhaler Inhale 2 puffs into the lungs every 4 (four) hours as needed for wheezing or shortness of breath. 02/05/17  Yes Gouru, Illene Silver, MD  aspirin EC 81 MG tablet Take 81 mg by mouth daily.   Yes [provider]  clopidogrel (PLAVIX) 75 MG tablet Take 1 tablet (75 mg total) by mouth daily. 03/14/17  Yes Seiler, Amber K, NP  ferrous sulfate 324 MG TBEC Take 324 mg by mouth daily.   Yes [provider]  FLUoxetine (PROZAC) 10 MG capsule Take 10 mg by mouth daily. 06/29/19  Yes [provider]  LANTUS SOLOSTAR 100 UNIT/ML Solostar Pen Inject 16 Units into the skin at bedtime. 06/09/19  Yes [provider]  levothyroxine (SYNTHROID) 50 MCG tablet Take 50 mcg by mouth See admin instructions. Take 1 tablet (61mg) by mouth every Tuesday, Thursday, Saturday and Sunday before breakfast   Yes [provider]  levothyroxine (SYNTHROID, LEVOTHROID) 75 MCG tablet Take 75 mcg by mouth See admin instructions. Take 1 tablet (738m) by mouth every Monday, Wednesday and Friday morning before breakfast   Yes [provider]  metoprolol succinate (TOPROL-XL) 25 MG 24 hr  tablet Take 12.5 mg by mouth daily.   Yes [provider]  mexiletine (MEXITIL) 200 MG capsule Take 1 capsule (200 mg total) by mouth every 12 (twelve) hours. 03/14/17  Yes Seiler, Amber K, NP  pantoprazole (PROTONIX) 40 MG tablet Take 40 mg by mouth daily. 05/16/19  Yes [provider]  Prenatal Vit-Fe Fumarate-FA (PRENATAL MULTIVITAMIN) TABS tablet Take 1 tablet by mouth daily.   Yes [provider]  ranolazine (RANEXA) 500 MG 12 hr tablet Take 1 tablet (500 mg total) by mouth 2 (two) times daily. 03/14/17  Yes Seiler, Amber K, NP  simvastatin (ZOCOR) 40 MG tablet Take 20 mg by mouth at bedtime.    Yes [provider]  sodium chloride (OCEAN) 0.65 % SOLN nasal spray Place 1 spray into both nostrils as needed for congestion. 07/05/19  Yes PaFritzi MandesMD  spironolactone (ALDACTONE) 25 MG tablet Take 25 mg by mouth daily.   Yes [provider]  traZODone (DESYREL) 50 MG tablet Take  50 mg by mouth at bedtime.   Yes [provider]  triamcinolone (NASACORT) 55 MCG/ACT AERO nasal inhaler Place 2 sprays into the nose 2 (two) times daily. 07/05/19  Yes Fritzi Mandes, MD    Review of Systems  Constitutional: Positive for fatigue. Negative for appetite change.  HENT: Positive for congestion. Negative for postnasal drip and sore throat.   Eyes: Negative.   Respiratory: Positive for shortness of breath (easily). Negative for cough.   Cardiovascular: Negative for chest pain, palpitations and leg swelling.  Gastrointestinal: Positive for abdominal distention. Negative for abdominal pain.  Endocrine: Negative.   Genitourinary: Negative.   Musculoskeletal: Positive for arthralgias (restless leg) and back pain.  Skin: Negative.   Allergic/Immunologic: Negative.   Neurological: Negative for dizziness and light-headedness.  Hematological: Negative for adenopathy. Does not bruise/bleed easily.  Psychiatric/Behavioral: Positive for sleep disturbance (sleeping  more during the day/ less at night unless take trazodone). Negative for dysphoric mood. The patient is not nervous/anxious.    Vitals:   07/14/19 1029  BP: 106/62  Pulse: 70  Resp: 16  Weight: 196 lb (88.9 kg)  Height: 5' 5"  (1.651 m)  PF: (!) 2 L/min   Wt Readings from Last 3 Encounters:  07/14/19 196 lb (88.9 kg)  07/02/19 193 lb 6.4 oz (87.7 kg)  03/15/18 180 lb (81.6 kg)   Lab Results  Component Value Date   CREATININE 1.81 (H) 07/05/2019   CREATININE 2.09 (H) 07/04/2019   CREATININE 2.32 (H) 07/03/2019    Physical Exam Vitals and nursing note reviewed.  Constitutional:      Appearance: She is well-developed.  HENT:     Head: Normocephalic and atraumatic.  Neck:     Vascular: No JVD.  Cardiovascular:     Rate and Rhythm: Normal rate and regular rhythm.  Pulmonary:     Effort: Pulmonary effort is normal. No respiratory distress.     Breath sounds: Examination of the right-lower field reveals rales. Rales present. No wheezing.  Abdominal:     General: There is distension.     Tenderness: There is no abdominal tenderness.  Musculoskeletal:     Cervical back: Normal range of motion and neck supple.     Right lower leg: No tenderness. Edema (trace pitting) present.     Left lower leg: No tenderness. Edema (trace pitting) present.  Skin:    General: Skin is warm and dry.  Neurological:     General: No focal deficit present.     Mental Status: She is alert and oriented to person, place, and time.  Psychiatric:        Mood and Affect: Mood normal.        Behavior: Behavior normal.    Assessment & Plan:  1: Acute on Chronic heart failure with reduced ejection fraction- - NYHA class III - moderately fluid overloaded with abdominal distention & rales - weighing daily and says that her weight is gradually going up; instructed her to call for an overnight weight gain of >2 pounds or a weekly weight gain of >5 pounds - will send for 73m IV lasix today (will not give  potassium due to high-normal level) - will get BMP/BNP today as well - not adding salt and she was encouraged to read food labels for sodium content carefully; written dietary information along with a low sodium cookbook were given to the patient - saw cardiology (Margarito Courser 05/19/19 - has ICD present & it has fired in the past - reports receiving  her flu vaccine for this season - swelling with lisinopril  2: HTN- - BP looks good today - saw PCP Payton Mccallum) 07/12/19 - BMP 07/12/19 reviewed and showed sodium 135, potassium 5.0, creatinine 1.6 and GFR 31 - seeing nephrology 07/20/19  3: DM- - saw endocrinology Pasty Arch) 06/08/19 - A1c 07/02/19 was 7.2% - fasting glucose at home today was 108  Medication list was reviewed.   Return in 2 days or sooner for any questions/problems before then. Will check lab work at that time again

## 2019-07-14 ENCOUNTER — Ambulatory Visit
Admission: RE | Admit: 2019-07-14 | Discharge: 2019-07-14 | Disposition: A | Discharge status: Home / Self Care | Payer: Medicare Other | Source: Ambulatory Visit | Attending: Family | Admitting: Family

## 2019-07-14 ENCOUNTER — Other Ambulatory Visit: Payer: Self-pay | Admitting: Family

## 2019-07-14 ENCOUNTER — Encounter: Payer: Self-pay | Admitting: Family

## 2019-07-14 ENCOUNTER — Ambulatory Visit: Payer: Medicare Other | Admitting: Family

## 2019-07-14 ENCOUNTER — Other Ambulatory Visit: Payer: Self-pay

## 2019-07-14 VITALS — BP 106/62 | HR 70 | Resp 16 | Ht 65.0 in | Wt 196.0 lb

## 2019-07-14 DIAGNOSIS — Z955 Presence of coronary angioplasty implant and graft: Secondary | ICD-10-CM | POA: Diagnosis not present

## 2019-07-14 DIAGNOSIS — I130 Hypertensive heart and chronic kidney disease with heart failure and stage 1 through stage 4 chronic kidney disease, or unspecified chronic kidney disease: Secondary | ICD-10-CM | POA: Diagnosis not present

## 2019-07-14 DIAGNOSIS — Z881 Allergy status to other antibiotic agents status: Secondary | ICD-10-CM | POA: Insufficient documentation

## 2019-07-14 DIAGNOSIS — E1122 Type 2 diabetes mellitus with diabetic chronic kidney disease: Secondary | ICD-10-CM | POA: Diagnosis not present

## 2019-07-14 DIAGNOSIS — Z79899 Other long term (current) drug therapy: Secondary | ICD-10-CM | POA: Diagnosis not present

## 2019-07-14 DIAGNOSIS — N189 Chronic kidney disease, unspecified: Secondary | ICD-10-CM | POA: Diagnosis not present

## 2019-07-14 DIAGNOSIS — F17210 Nicotine dependence, cigarettes, uncomplicated: Secondary | ICD-10-CM | POA: Diagnosis not present

## 2019-07-14 DIAGNOSIS — F419 Anxiety disorder, unspecified: Secondary | ICD-10-CM | POA: Diagnosis not present

## 2019-07-14 DIAGNOSIS — Z882 Allergy status to sulfonamides status: Secondary | ICD-10-CM | POA: Insufficient documentation

## 2019-07-14 DIAGNOSIS — Z794 Long term (current) use of insulin: Secondary | ICD-10-CM | POA: Insufficient documentation

## 2019-07-14 DIAGNOSIS — Z8249 Family history of ischemic heart disease and other diseases of the circulatory system: Secondary | ICD-10-CM | POA: Insufficient documentation

## 2019-07-14 DIAGNOSIS — I5023 Acute on chronic systolic (congestive) heart failure: Secondary | ICD-10-CM | POA: Diagnosis present

## 2019-07-14 DIAGNOSIS — Z886 Allergy status to analgesic agent status: Secondary | ICD-10-CM | POA: Diagnosis not present

## 2019-07-14 DIAGNOSIS — I10 Essential (primary) hypertension: Secondary | ICD-10-CM

## 2019-07-14 DIAGNOSIS — N1832 Chronic kidney disease, stage 3b: Secondary | ICD-10-CM

## 2019-07-14 DIAGNOSIS — I2510 Atherosclerotic heart disease of native coronary artery without angina pectoris: Secondary | ICD-10-CM | POA: Insufficient documentation

## 2019-07-14 DIAGNOSIS — Z7902 Long term (current) use of antithrombotics/antiplatelets: Secondary | ICD-10-CM | POA: Insufficient documentation

## 2019-07-14 DIAGNOSIS — Z950 Presence of cardiac pacemaker: Secondary | ICD-10-CM | POA: Insufficient documentation

## 2019-07-14 DIAGNOSIS — Z880 Allergy status to penicillin: Secondary | ICD-10-CM | POA: Diagnosis not present

## 2019-07-14 DIAGNOSIS — Z888 Allergy status to other drugs, medicaments and biological substances status: Secondary | ICD-10-CM | POA: Insufficient documentation

## 2019-07-14 DIAGNOSIS — Z7982 Long term (current) use of aspirin: Secondary | ICD-10-CM | POA: Insufficient documentation

## 2019-07-14 DIAGNOSIS — E079 Disorder of thyroid, unspecified: Secondary | ICD-10-CM | POA: Diagnosis not present

## 2019-07-14 DIAGNOSIS — I252 Old myocardial infarction: Secondary | ICD-10-CM | POA: Diagnosis not present

## 2019-07-14 DIAGNOSIS — F329 Major depressive disorder, single episode, unspecified: Secondary | ICD-10-CM | POA: Insufficient documentation

## 2019-07-14 DIAGNOSIS — Z79890 Hormone replacement therapy (postmenopausal): Secondary | ICD-10-CM | POA: Diagnosis not present

## 2019-07-14 DIAGNOSIS — F17290 Nicotine dependence, other tobacco product, uncomplicated: Secondary | ICD-10-CM | POA: Diagnosis not present

## 2019-07-14 LAB — BASIC METABOLIC PANEL
Anion gap: 13 (ref 5–15)
BUN: 34 mg/dL — ABNORMAL HIGH (ref 8–23)
CO2: 24 mmol/L (ref 22–32)
Calcium: 9.3 mg/dL (ref 8.9–10.3)
Chloride: 98 mmol/L (ref 98–111)
Creatinine, Ser: 1.95 mg/dL — ABNORMAL HIGH (ref 0.44–1.00)
GFR calc Af Amer: 28 mL/min — ABNORMAL LOW (ref >60)
GFR calc non Af Amer: 24 mL/min — ABNORMAL LOW (ref >60)
Glucose, Bld: 96 mg/dL (ref 70–99)
Potassium: 4.6 mmol/L (ref 3.5–5.1)
Sodium: 135 mmol/L (ref 135–145)

## 2019-07-14 LAB — BRAIN NATRIURETIC PEPTIDE: B Natriuretic Peptide: 831 pg/mL — ABNORMAL HIGH (ref 0.0–100.0)

## 2019-07-14 MED ORDER — FUROSEMIDE 10 MG/ML IJ SOLN
80.0000 mg | Freq: Once | INTRAMUSCULAR | Status: AC
  Administered 2019-07-14: 80 mg via INTRAVENOUS

## 2019-07-14 NOTE — Patient Instructions (Signed)
Continue weighing daily and call for an overnight weight gain of > 2 pounds or a weekly weight gain of >5 pounds. 

## 2019-07-16 ENCOUNTER — Other Ambulatory Visit: Payer: Self-pay

## 2019-07-16 ENCOUNTER — Emergency Department: Payer: Medicare Other

## 2019-07-16 ENCOUNTER — Encounter: Payer: Self-pay | Admitting: Emergency Medicine

## 2019-07-16 ENCOUNTER — Emergency Department
Admission: EM | Admit: 2019-07-16 | Discharge: 2019-07-16 | Disposition: A | Discharge status: Home / Self Care | Payer: Medicare Other | Attending: Emergency Medicine | Admitting: Emergency Medicine

## 2019-07-16 ENCOUNTER — Ambulatory Visit: Payer: Medicare Other | Admitting: Family

## 2019-07-16 DIAGNOSIS — Z7902 Long term (current) use of antithrombotics/antiplatelets: Secondary | ICD-10-CM | POA: Diagnosis not present

## 2019-07-16 DIAGNOSIS — E039 Hypothyroidism, unspecified: Secondary | ICD-10-CM | POA: Diagnosis not present

## 2019-07-16 DIAGNOSIS — Z950 Presence of cardiac pacemaker: Secondary | ICD-10-CM | POA: Insufficient documentation

## 2019-07-16 DIAGNOSIS — R140 Abdominal distension (gaseous): Secondary | ICD-10-CM | POA: Insufficient documentation

## 2019-07-16 DIAGNOSIS — I252 Old myocardial infarction: Secondary | ICD-10-CM | POA: Diagnosis not present

## 2019-07-16 DIAGNOSIS — N183 Chronic kidney disease, stage 3 unspecified: Secondary | ICD-10-CM | POA: Insufficient documentation

## 2019-07-16 DIAGNOSIS — Z9049 Acquired absence of other specified parts of digestive tract: Secondary | ICD-10-CM | POA: Diagnosis not present

## 2019-07-16 DIAGNOSIS — E119 Type 2 diabetes mellitus without complications: Secondary | ICD-10-CM | POA: Diagnosis not present

## 2019-07-16 DIAGNOSIS — Z794 Long term (current) use of insulin: Secondary | ICD-10-CM | POA: Insufficient documentation

## 2019-07-16 DIAGNOSIS — I5042 Chronic combined systolic (congestive) and diastolic (congestive) heart failure: Secondary | ICD-10-CM | POA: Diagnosis not present

## 2019-07-16 DIAGNOSIS — I130 Hypertensive heart and chronic kidney disease with heart failure and stage 1 through stage 4 chronic kidney disease, or unspecified chronic kidney disease: Secondary | ICD-10-CM | POA: Diagnosis not present

## 2019-07-16 DIAGNOSIS — R531 Weakness: Secondary | ICD-10-CM

## 2019-07-16 DIAGNOSIS — R1010 Upper abdominal pain, unspecified: Secondary | ICD-10-CM | POA: Insufficient documentation

## 2019-07-16 DIAGNOSIS — Z7982 Long term (current) use of aspirin: Secondary | ICD-10-CM | POA: Insufficient documentation

## 2019-07-16 DIAGNOSIS — Z79899 Other long term (current) drug therapy: Secondary | ICD-10-CM | POA: Insufficient documentation

## 2019-07-16 DIAGNOSIS — E860 Dehydration: Secondary | ICD-10-CM | POA: Insufficient documentation

## 2019-07-16 DIAGNOSIS — I2510 Atherosclerotic heart disease of native coronary artery without angina pectoris: Secondary | ICD-10-CM | POA: Insufficient documentation

## 2019-07-16 DIAGNOSIS — R188 Other ascites: Secondary | ICD-10-CM

## 2019-07-16 LAB — URINALYSIS, COMPLETE (UACMP) WITH MICROSCOPIC
Bilirubin Urine: NEGATIVE
Glucose, UA: NEGATIVE mg/dL
Hgb urine dipstick: NEGATIVE
Ketones, ur: NEGATIVE mg/dL
Nitrite: NEGATIVE
Protein, ur: 30 mg/dL — AB
Specific Gravity, Urine: 1.018 (ref 1.005–1.030)
pH: 5 (ref 5.0–8.0)

## 2019-07-16 LAB — CBC
HCT: 30.1 % — ABNORMAL LOW (ref 36.0–46.0)
Hemoglobin: 8.9 g/dL — ABNORMAL LOW (ref 12.0–15.0)
MCH: 28.3 pg (ref 26.0–34.0)
MCHC: 29.6 g/dL — ABNORMAL LOW (ref 30.0–36.0)
MCV: 95.6 fL (ref 80.0–100.0)
Platelets: 273 10*3/uL (ref 150–400)
RBC: 3.15 MIL/uL — ABNORMAL LOW (ref 3.87–5.11)
RDW: 20.3 % — ABNORMAL HIGH (ref 11.5–15.5)
WBC: 9.5 10*3/uL (ref 4.0–10.5)
nRBC: 0.5 % — ABNORMAL HIGH (ref 0.0–0.2)

## 2019-07-16 LAB — GLUCOSE, CAPILLARY: Glucose-Capillary: 89 mg/dL (ref 70–99)

## 2019-07-16 LAB — BASIC METABOLIC PANEL
Anion gap: 10 (ref 5–15)
BUN: 39 mg/dL — ABNORMAL HIGH (ref 8–23)
CO2: 23 mmol/L (ref 22–32)
Calcium: 8.9 mg/dL (ref 8.9–10.3)
Chloride: 101 mmol/L (ref 98–111)
Creatinine, Ser: 2.22 mg/dL — ABNORMAL HIGH (ref 0.44–1.00)
GFR calc Af Amer: 24 mL/min — ABNORMAL LOW (ref >60)
GFR calc non Af Amer: 21 mL/min — ABNORMAL LOW (ref >60)
Glucose, Bld: 107 mg/dL — ABNORMAL HIGH (ref 70–99)
Potassium: 5 mmol/L (ref 3.5–5.1)
Sodium: 134 mmol/L — ABNORMAL LOW (ref 135–145)

## 2019-07-16 MED ORDER — SODIUM CHLORIDE 0.9 % IV BOLUS
250.0000 mL | Freq: Once | INTRAVENOUS | Status: AC
  Administered 2019-07-16: 250 mL via INTRAVENOUS

## 2019-07-16 MED ORDER — SODIUM CHLORIDE 0.9% FLUSH
3.0000 mL | Freq: Once | INTRAVENOUS | Status: AC
  Administered 2019-07-16: 3 mL via INTRAVENOUS

## 2019-07-16 MED ORDER — IOHEXOL 9 MG/ML PO SOLN
500.0000 mL | Freq: Once | ORAL | Status: AC | PRN
  Administered 2019-07-16: 500 mL via ORAL

## 2019-07-16 NOTE — ED Provider Notes (Signed)
Patient appears appropriate for discharge.  Has a history of baseline hypotension.  Discussed case with cardiology, also discussed with the patient need for follow-up with primary as well as cardiology, cardiology would like to see her on Monday and she is comfortable setting that up.  I did discuss some with the patient her goals of care, she did express she does not wish to be hospitalized, and I think at this point it would be reasonable for her to trial ongoing care at home with close outpatient follow-up which has been discussed.  CT ABDOMEN PELVIS WO CONTRAST  Result Date: 07/16/2019 CLINICAL DATA:  Abdominal distension EXAM: CT ABDOMEN AND PELVIS WITHOUT CONTRAST TECHNIQUE: Multidetector CT imaging of the abdomen and pelvis was performed following the standard protocol without IV contrast. COMPARISON:  03/02/2019 CT FINDINGS: Lower chest: Small right pleural effusion. Some adjacent passive atelectasis the right lower lobe. There is mild cardiomegaly with trace pericardial fluid. Cardiac pacer wires noted in the right atrium and directed towards the cardiac apex. Slight ballooning of the left ventricular apex with stable calcifications may reflect sequela of prior infarct. Hepatobiliary: No focal liver lesion is seen. Prominent Riedel's lobe is noted. Slight left lobe and caudate hypertrophy similar to prior. Patient is post cholecystectomy. Mild prominence of the biliary tree may be related to combination of age and post cholecystectomy reservoir effect. Pancreas: Unremarkable. No pancreatic ductal dilatation or surrounding inflammatory changes. 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 Unchanged appearance from prior. No acute splenic abnormality. Adrenals/Urinary Tract: Stable low-attenuation (less than 10 HU) bilateral adrenal masses most compatible with adrenal adenomas as detailed on prior studies. Bilateral parenchymal scarring and  moderate atrophy. No visible or contour deforming renal lesions are seen. Bilateral vascular calcifications without discernible collecting system calcification. No obstructing urolithiasis or hydronephrosis. Urinary bladder is largely decompressed at the time of exam and therefore poorly evaluated by CT imaging. Stomach/Bowel: Distal esophagus, stomach and duodenal sweep are unremarkable. No small bowel wall thickening or dilatation. No evidence of obstruction. A normal appendix is visualized. No colonic dilatation or wall thickening. Mild segmental thickening of the distal sigmoid in a region of numerous colonic diverticula but without inflammation centered upon a focal culprit diverticulum. Vascular/Lymphatic: Extensive severe atherosclerotic plaque of the abdominal aorta and branch vessels. Luminal evaluation is limited due to lack of IV contrast medium however there is focal narrowing of the infrarenal abdominal aorta just proximal to the bifurcation with as well as extensive ostial plaque within the splanchnic vasculature and renal arteries. Mild ectasia and extensive plaque of the common iliac arteries is noted as well. Reproductive: Slightly retroflexed uterus. No concerning adnexal lesions. Other: Small to moderate volume low-attenuation (12-15 HU) ascites predominantly in the perihepatic, perisplenic spaces, pericolic gutters, and layering in the deep pelvis. There is circumferential body wall edema with more focal edematous changes of the lower abdominal panniculus as well. No bowel containing hernia. No intraperitoneal free air. Musculoskeletal: Multilevel degenerative changes are present in the imaged portions of the spine. No acute osseous abnormality or suspicious osseous lesion. IMPRESSION: 1. Features of anasarca with developing ascites, right pleural effusion and circumferential body wall edema. 2. Mild segmental thickening of the distal sigmoid colon in a region of numerous colonic diverticula but  without inflammation centered upon a focal culprit diverticulum. Findings could reflect underdistention or sequela of chronic inflammation versus less likely colitis or diverticulitis. 3. Stable bilateral adrenal adenomas. 4. Aortic Atherosclerosis (ICD10-I70.0). 5. Atheromatous  distal infrarenal abdominal aortic narrowing as well as ostial narrowing of the splanchnic arteries and renal vasculature. Bilateral common iliac artery ectasia noted as well. Electronically Signed   By: Lovena Le M.D.   On: 07/16/2019 17:56   DG Abdomen 1 View  Result Date: 07/16/2019 CLINICAL DATA:  Abdominal distension, constipation EXAM: ABDOMEN - 1 VIEW COMPARISON:  03/02/2019 FINDINGS: The bowel gas pattern is nonobstructive. No gross free intraperitoneal air on supine films. Cholecystectomy clips. Cardiomegaly. IMPRESSION: Nonobstructive bowel gas pattern. Electronically Signed   By: Davina Poke D.O.   On: 07/16/2019 13:06   DG Chest Port 1 View  Result Date: 07/16/2019 CLINICAL DATA:  Weakness EXAM: PORTABLE CHEST 1 VIEW COMPARISON:  07/02/2019 FINDINGS: Left-sided implanted cardiac device. Stable cardiomegaly. Mild pulmonary vascular congestion with subtle increased interstitial markings, similar to prior. No focal airspace consolidation. No pleural effusion or pneumothorax. IMPRESSION: Findings suggestive of CHF with mild interstitial edema. Electronically Signed   By: Davina Poke D.O.   On: 07/16/2019 13:03    CT imaging study reviewed, notable anasarca, some developing ascites and pleural effusion.  This was also discussed with Dr. Ubaldo Glassing, he advises given her history that would still seem reasonable to give her a small fluid bolus she is likely notably third spacing.  Patient understanding, agreeable with plan.   Delman Kitten, MD 07/16/19 1949

## 2019-07-16 NOTE — ED Notes (Signed)
Manuela Schwartz nurse case manager at bedside speaking with pt now.

## 2019-07-16 NOTE — Discharge Instructions (Addendum)
Please follow-up closely with your primary doctor as well as cardiology.  Cardiology would like to see you for an appointment Monday morning, please call the clinic when they open on Monday to set this up.  Continue current medications, return to the ER if worsening symptoms or new concerns arise.

## 2019-07-16 NOTE — ED Notes (Signed)
Pt states she's on oxygen at home. Daughter called and home tank to be brought.

## 2019-07-16 NOTE — ED Provider Notes (Signed)
Patient resting comfortably, awaiting CT scan results.  Does report some element of restless leg syndrome, however the is reported to be chronic.   Delman Kitten, MD 07/16/19 1755

## 2019-07-16 NOTE — ED Notes (Signed)
Pt agrees to notify this RN when she can provide urine sample. Call bell within reach. Rails up. Bed locked low. Has warm blanket. Denies any needs currently.

## 2019-07-16 NOTE — ED Triage Notes (Signed)
Pt to ED via [facility] from home for generalized weakness. Per EMS when they arrived pt found to be hypotensive at 90/72. Pt is on O2 at home at 2 liters. When EMS arrived SpO2 was 92%, EMS reports sat of 82% on room air in route, pt was placed on 3 liters SpO2 and sats returned to 94-99%.  Pt reports being D/C from hospital 2 weeks ago. Pt states that she has felt bad since that time. Pt reports getting 1 unit of blood while admitted. Pt states that she is unable to walk very far because her legs get weak and she gets short of breath.  Pt is A & O x 4 on arrival, in NAD.

## 2019-07-16 NOTE — TOC Initial Note (Addendum)
Transition of Care [facility]) - Initial/Assessment Note    Patient Details  Name: Heather Peterson MRN: [id number] Date of Birth: [birth year]  Transition of Care [facility]) CM/SW Contact:    Anselm Pancoast, RN Phone Number: 07/16/2019, 3:27 PM  Clinical Narrative:                 Spoke with patient with MD at bedside. Confirmed patient is active with [facility]. Daughter and husband provide transportation as needed and assist in care. Patient has active O2 and portable home O2. RN CM notified [place].   Expected Discharge Plan: [facility](active with [facility])     Patient Goals and CMS Choice Patient states their goals for this hospitalization and ongoing recovery are:: Get home and get feeling better      Expected Discharge Plan and Services Expected Discharge Plan: [facility](active with [facility])       Living arrangements for the past 2 months: Single Family Home                                      Prior Living Arrangements/Services Living arrangements for the past 2 months: Single Family Home Lives with:: Spouse, Adult Children Patient language and need for interpreter reviewed:: Yes Do you feel safe going back to the place where you live?: Yes      Need for Family Participation in Patient Care: Yes (Comment) Care giver support system in place?: Yes (comment) Current home services: DME(O2@ home, portable tank, Walker, cane, walkin shower with grab bars) Criminal Activity/Legal Involvement Pertinent to Current Situation/Hospitalization: No - Comment as needed  Activities of Daily Living      Permission Sought/Granted   Permission granted to share information with : Yes, Release of Information Signed     Permission granted to share info w AGENCY: [facility]        Emotional Assessment Appearance:: Appears stated age Attitude/Demeanor/Rapport: Engaged Affect (typically observed): Accepting Orientation: : Oriented  to Self, Oriented to Place, Oriented to  Time, Oriented to Situation Alcohol / Substance Use: Tobacco Use(6 cigs daily) Psych Involvement: No (comment)  Admission diagnosis:  Low BP  Patient Active Problem List   Diagnosis Date Noted  . Dehydration   . Anemia in chronic kidney disease (CKD) 07/04/2019  . CKD (chronic kidney disease), stage III 07/04/2019  . Chronic respiratory failure with hypoxia ([place]) 07/03/2019  . Hyponatremia 07/03/2019  . Prolonged Q-T interval on ECG   . Weakness   . Anemia   . MDD (major depressive disorder), recurrent episode, mild ([place]) 12/14/2018  . Tobacco use disorder 12/14/2018  . At risk for prolonged QT interval syndrome 12/14/2018  . 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 Pedal edema 02/19/2018  . SOB (shortness of breath) on exertion 02/19/2018  . Left main coronary artery disease 03/07/2017  . Drug-induced torsades de pointes 03/06/2017  . Ventricular tachycardia ([place]) 03/05/2017  . Defibrillator discharge 03/04/2017  . CHF (congestive heart failure), NYHA class III, chronic, combined ([place]) 03/04/2017  . COPD (chronic obstructive pulmonary disease) ([place]) 02/10/2017  . Diabetes mellitus type 2, uncomplicated ([place]) 40/98/1191  . H/O ventricular fibrillation 02/10/2017  . Hyperlipidemia 02/10/2017  . 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 Ischemic cardiomyopathy 02/10/2017  . Myocardial infarction ([place]) 02/10/2017  . Moderate mitral regurgitation 02/10/2017  . Syncope and collapse 02/04/2017  . CAD (coronary  artery disease) 02/04/2017  . HTN (hypertension) 02/04/2017  . Diabetes ([place]) 02/04/2017   PCP:  [name], MD Pharmacy:   [facility] [phone] [address], [place] [address] [place] [address] Phone: [phone] Fax: [phone]     Social Determinants of Health (SDOH) Interventions    Readmission Risk Interventions No  flowsheet data found.

## 2019-07-16 NOTE — ED Notes (Signed)
Pt given cup of water and dinner tray with verbal okay from [place].

## 2019-07-16 NOTE — ED Provider Notes (Addendum)
Case, clinical history, recent admission and CHF clinic follow-up discussed with Dr. Ubaldo Glassing.   Advises can trial a small fluid bolus.  Cardiology would be able to follow her up in the clinic on Monday morning.  Discussed with patient, she is agreeable with this.  Currently eating.  She does feel like she is a little dehydrated, and we will provide her a small bolus and reassess.  Discussed with the patient, she does not feel short of breath, she uses oxygen at home, she currently has normal work of breathing.   Delman Kitten, MD 07/16/19 Guerry Bruin, MD 07/16/19 [phone]

## 2019-07-16 NOTE — ED Notes (Addendum)
Pt requesting drink. Explained why this RN cannot give her a drink currently. Pt now requesting ice chips. Will ask EDP [place]. Pt repositioned in bed.

## 2019-07-16 NOTE — ED Provider Notes (Signed)
[facility] Emergency Department Provider Note   ____________________________________________    I have reviewed the triage vital signs and the nursing notes.   HISTORY  Chief Complaint Weakness     HPI Heather Peterson is a 79 y.o. female history with CKD, CHF, CAD, diabetes, hypertension, pacemaker who presents with complaints of weakness.  Patient reports she has been feeling weak since being discharged approximately 2 weeks ago.  She denies chest pain, reports some mild shortness of breath especially with exertion.  Does report feeling fatigued.  Also reports abdominal distention which she thinks is affecting her breathing.  Has not had a bowel movement in several days.  Patient is reportedly on home O2  Past Medical History:  Diagnosis Date  . Anxiety   . Chronic combined systolic and diastolic CHF (congestive heart failure) ([place])   . Chronic kidney disease   . Coronary artery disease   . Depression   . Diabetes mellitus without complication ([place])   . Diabetes mellitus, type II ([place])   . Hypertension   . MI (myocardial infarction) ([place])    x 5  . Pacemaker   . Prolonged Q-T interval on ECG   . Thyroid disease     Patient Active Problem List   Diagnosis Date Noted  . Dehydration   . Anemia in chronic kidney disease (CKD) 07/04/2019  . CKD (chronic kidney disease), stage III 07/04/2019  . Chronic respiratory failure with hypoxia ([place]) 07/03/2019  . Hyponatremia 07/03/2019  . Prolonged Q-T interval on ECG   . Weakness   . Anemia   . MDD (major depressive disorder), recurrent episode, mild ([place]) 12/14/2018  . Tobacco use disorder 12/14/2018  . At risk for prolonged QT interval syndrome 12/14/2018  . 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 Pedal edema 02/19/2018  . SOB (shortness of breath) on exertion 02/19/2018  . Left main coronary artery disease 03/07/2017  . Drug-induced torsades de pointes 03/06/2017  . Ventricular  tachycardia ([place]) 03/05/2017  . Defibrillator discharge 03/04/2017  . CHF (congestive heart failure), NYHA class III, chronic, combined ([place]) 03/04/2017  . COPD (chronic obstructive pulmonary disease) ([place]) 02/10/2017  . Diabetes mellitus type 2, uncomplicated ([place]) 54/98/2641  . H/O ventricular fibrillation 02/10/2017  . Hyperlipidemia 02/10/2017  . 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 Ischemic cardiomyopathy 02/10/2017  . Myocardial infarction ([place]) 02/10/2017  . Moderate mitral regurgitation 02/10/2017  . Syncope and collapse 02/04/2017  . CAD (coronary artery disease) 02/04/2017  . HTN (hypertension) 02/04/2017  . Diabetes ([place]) 02/04/2017    Past Surgical History:  Procedure Laterality Date  . CENTRAL LINE INSERTION  03/11/2017   Procedure: CENTRAL LINE INSERTION;  Surgeon: Leonie Man, MD;  Location: [place] [facility];  Service: Cardiovascular;;  . CHOLECYSTECTOMY    . CORONARY STENT INTERVENTION W/IMPELLA N/A 03/11/2017   Procedure: Coronary Stent Intervention w/Impella;  Surgeon: Leonie Man, MD;  Location: [place] [facility];  Service: Cardiovascular;  Laterality: N/A;  . EYE SURGERY    . INTRAVASCULAR PRESSURE WIRE/FFR STUDY N/A 03/11/2017   Procedure: INTRAVASCULAR PRESSURE WIRE/FFR STUDY;  Surgeon: Leonie Man, MD;  Location: [place] [facility];  Service: Cardiovascular;  Laterality: N/A;  . INTRAVASCULAR ULTRASOUND/IVUS N/A 03/11/2017   Procedure: Intravascular Ultrasound/IVUS;  Surgeon: Leonie Man, MD;  Location: [place] [facility];  Service: Cardiovascular;  Laterality: N/A;  . LEFT HEART CATH AND CORONARY ANGIOGRAPHY N/A 03/05/2017   Procedure: LEFT HEART CATH AND CORONARY  ANGIOGRAPHY;  Surgeon: Isaias Cowman, MD;  Location: [place] [facility];  Service: Cardiovascular;  Laterality: N/A;  . PACEMAKER IMPLANT    . pacemaker/defibrillator Left     Prior to Admission medications   Medication Sig Start Date  End Date Taking? Authorizing Provider  acetaminophen (TYLENOL) 500 MG tablet Take 500 mg by mouth every 4 (four) hours as needed for mild pain, fever or headache.     [provider]  albuterol (PROVENTIL HFA;VENTOLIN HFA) 108 (90 Base) MCG/ACT inhaler Inhale 2 puffs into the lungs every 4 (four) hours as needed for wheezing or shortness of breath. 02/05/17   Nicholes Mango, MD  aspirin EC 81 MG tablet Take 81 mg by mouth daily.    [provider]  clopidogrel (PLAVIX) 75 MG tablet Take 1 tablet (75 mg total) by mouth daily. 03/14/17   Chanetta Marshall K, NP  ferrous sulfate 324 MG TBEC Take 324 mg by mouth daily.    [provider]  FLUoxetine (PROZAC) 10 MG capsule Take 10 mg by mouth daily. 06/29/19   [provider]  LANTUS SOLOSTAR 100 UNIT/ML Solostar Pen Inject 16 Units into the skin at bedtime. 06/09/19   [provider]  levothyroxine (SYNTHROID) 50 MCG tablet Take 50 mcg by mouth See admin instructions. Take 1 tablet (33mg) by mouth every Tuesday, Thursday, Saturday and Sunday before breakfast    [provider]  levothyroxine (SYNTHROID, LEVOTHROID) 75 MCG tablet Take 75 mcg by mouth See admin instructions. Take 1 tablet (729m) by mouth every Monday, Wednesday and Friday morning before breakfast    [provider]  metoprolol succinate (TOPROL-XL) 25 MG 24 hr tablet Take 12.5 mg by mouth daily.    [provider]  mexiletine (MEXITIL) 200 MG capsule Take 1 capsule (200 mg total) by mouth every 12 (twelve) hours. 03/14/17   SeChanetta Marshall, NP  pantoprazole (PROTONIX) 40 MG tablet Take 40 mg by mouth daily. 05/16/19   [provider]  Prenatal Vit-Fe Fumarate-FA (PRENATAL MULTIVITAMIN) TABS tablet Take 1 tablet by mouth daily.    [provider]  ranolazine (RANEXA) 500 MG 12 hr tablet Take 1 tablet (500 mg total) by mouth 2 (two) times daily. 03/14/17   SeChanetta Marshall, NP  simvastatin (ZOCOR) 40 MG tablet Take  20 mg by mouth at bedtime.     [provider]  sodium chloride (OCEAN) 0.65 % SOLN nasal spray Place 1 spray into both nostrils as needed for congestion. 07/05/19   PaFritzi MandesMD  spironolactone (ALDACTONE) 25 MG tablet Take 25 mg by mouth daily.    [provider]  traZODone (DESYREL) 50 MG tablet Take 50 mg by mouth at bedtime.    [provider]  triamcinolone (NASACORT) 55 MCG/ACT AERO nasal inhaler Place 2 sprays into the nose 2 (two) times daily. 07/05/19   PaFritzi MandesMD     Allergies Celebrex [celecoxib], Glipizide, Lisinopril, Sulfa antibiotics, Levaquin [levofloxacin in d5w], Levofloxacin, Metformin, and Penicillins  Family History  Problem Relation Age of Onset  . Hypertension Father   . Heart attack Father   . Depression Sister   . Depression Brother   . Depression Brother     Social History Social History   Tobacco Use  . Smoking status: Current Every Day Smoker    Packs/day: 0.25    Types: E-cigarettes, Cigarettes  . Smokeless tobacco: Never Used  Substance Use Topics  . Alcohol use: Not Currently    Comment:  occasionally  . Drug use: No    Review of Systems  Constitutional: No fever/chills, positive fatigue Eyes: No visual changes.  ENT: No sore throat. Cardiovascular: Denies chest pain. Respiratory: As above Gastrointestinal: As above, mild upper abdominal pain Genitourinary: Negative for dysuria. Musculoskeletal: Negative for back pain. Skin: Negative for rash. Neurological: Negative for headaches   ____________________________________________   PHYSICAL EXAM:  VITAL SIGNS: ED Triage Vitals  Enc Vitals Group     BP 07/16/19 1212 100/63     Pulse Rate 07/16/19 1215 71     Resp 07/16/19 1214 17     Temp 07/16/19 1209 97.6 F (36.4 C)     Temp Source 07/16/19 1209 Oral     SpO2 07/16/19 1217 94 %     Weight 07/16/19 1209 87.5 kg (193 lb)     Height 07/16/19 1209 1.626 m (5' 4" )     Head Circumference --       Peak Flow --      Pain Score 07/16/19 1209 0     Pain Loc --      Pain Edu? --      Excl. in [place]? --     Constitutional: Alert and oriented.   Nose: No congestion/rhinnorhea. Mouth/Throat: Mucous membranes are moist.    Cardiovascular: Normal rate, regular rhythm.  Good peripheral circulation. Respiratory: Normal respiratory effort.  No retractions.  Bibasilar mild rales Gastrointestinal: Soft and nontender. No distention.  No CVA tenderness. Genitourinary: deferred Musculoskeletal:   Warm and well perfused, no edema Neurologic:  Normal speech and language. No gross focal neurologic deficits are appreciated.  Skin:  Skin is warm, dry and intact. No rash noted. Psychiatric: Mood and affect are normal. Speech and behavior are normal.  ____________________________________________   LABS (all labs ordered are listed, but only abnormal results are displayed)  Labs Reviewed  BASIC METABOLIC PANEL - Abnormal; Notable for the following components:      Result Value   Sodium 134 (*)    Glucose, Bld 107 (*)    BUN 39 (*)    Creatinine, Ser 2.22 (*)    GFR calc non Af Amer 21 (*)    GFR calc Af Amer 24 (*)    All other components within normal limits  CBC - Abnormal; Notable for the following components:   RBC 3.15 (*)    Hemoglobin 8.9 (*)    HCT 30.1 (*)    MCHC 29.6 (*)    RDW 20.3 (*)    nRBC 0.5 (*)    All other components within normal limits  GLUCOSE, CAPILLARY  URINALYSIS, COMPLETE (UACMP) WITH MICROSCOPIC  CBG MONITORING, ED   ____________________________________________  EKG  ED ECG REPORT I, Lavonia Drafts, the attending physician, personally viewed and interpreted this ECG.  Date: 07/16/2019  Rhythm: normal sinus rhythm QRS Axis: normal Intervals: Nonspecific IVCD ST/T Wave abnormalities: normal Narrative Interpretation: no evidence of acute ischemia  ____________________________________________  RADIOLOGY  Chest x-ray shows mild interstitial edema,  unchanged from prior ____________________________________________   PROCEDURES  Procedure(s) performed: No  Procedures   Critical Care performed: No ____________________________________________   INITIAL IMPRESSION / ASSESSMENT AND PLAN / ED COURSE  Pertinent labs & imaging results that were available during my care of the patient were reviewed by me and considered in my medical decision making (see chart for details).  Patient presents with complaints of weakness, mild shortness of breath and occasional dizziness.  Blood pressure was initially soft however now seems to be in  line with her prior blood pressures.  She has no chest pain and her EKG is unchanged.  Her creatinine is consistent with prior levels given her CKD, she does have a history of anemia, hemoglobin is improved from before.  Very mild hyponatremia.  Patient reports she did receive IV Lasix 2 days ago at heart failure clinic.  Question whether she may have been over diuresed, she is drinking water now we will see if this improves her blood pressure.  Also complains of abdominal distention, KUB is unremarkable.  May be related to constipation no evidence of impaction however given her description of pain will obtain CT imaging.  Unable to do IV contrast.    ____________________________________________   FINAL CLINICAL IMPRESSION(S) / ED DIAGNOSES  Final diagnoses:  Generalized weakness  Dehydration  Abdominal distension        Note:  This document was prepared using Dragon voice recognition software and may include unintentional dictation errors.   Lavonia Drafts, MD 07/16/19 1452

## 2019-07-16 NOTE — ED Notes (Addendum)
EDP Kinner at bedside with this RN. Aware of fluctuating BP. Pt states she can provide urine sample now. Will assist pt once EDP Kinner done updating pt.

## 2019-07-16 NOTE — ED Notes (Signed)
See triage note. Pt denies seeing blood in urine or BMs. Reports mild chest discomfort when she gets SOB. Inc leg weakness over past several months per pt. States usually on 2L home O2. Currently on 4L via [place]. BG 89. Resp regular/unlabored currently. Pt denies pain currently.

## 2019-07-16 NOTE — ED Notes (Signed)
Imaging staff at bedside.

## 2019-07-20 DIAGNOSIS — R809 Proteinuria, unspecified: Secondary | ICD-10-CM | POA: Insufficient documentation

## 2019-07-20 DIAGNOSIS — I129 Hypertensive chronic kidney disease with stage 1 through stage 4 chronic kidney disease, or unspecified chronic kidney disease: Secondary | ICD-10-CM | POA: Insufficient documentation

## 2019-07-20 DIAGNOSIS — E871 Hypo-osmolality and hyponatremia: Secondary | ICD-10-CM | POA: Insufficient documentation

## 2019-07-26 DIAGNOSIS — G4701 Insomnia due to medical condition: Secondary | ICD-10-CM | POA: Insufficient documentation

## 2019-07-26 DIAGNOSIS — K5903 Drug induced constipation: Secondary | ICD-10-CM | POA: Diagnosis present

## 2019-08-02 ENCOUNTER — Other Ambulatory Visit: Payer: Self-pay | Admitting: Gastroenterology

## 2019-08-02 DIAGNOSIS — R188 Other ascites: Secondary | ICD-10-CM

## 2019-08-05 ENCOUNTER — Other Ambulatory Visit: Payer: Self-pay

## 2019-08-05 ENCOUNTER — Ambulatory Visit
Admission: RE | Admit: 2019-08-05 | Discharge: 2019-08-05 | Disposition: A | Discharge status: Home / Self Care | Payer: Medicare Other | Source: Ambulatory Visit | Attending: Gastroenterology | Admitting: Gastroenterology

## 2019-08-05 DIAGNOSIS — R188 Other ascites: Secondary | ICD-10-CM | POA: Insufficient documentation

## 2019-08-06 ENCOUNTER — Ambulatory Visit
Admission: RE | Admit: 2019-08-06 | Discharge: 2019-08-06 | Disposition: A | Discharge status: Home / Self Care | Payer: Medicare Other | Source: Ambulatory Visit | Attending: Gastroenterology | Admitting: Gastroenterology

## 2019-08-06 ENCOUNTER — Other Ambulatory Visit: Payer: Self-pay | Admitting: Gastroenterology

## 2019-08-06 ENCOUNTER — Other Ambulatory Visit: Payer: Self-pay

## 2019-08-06 DIAGNOSIS — R188 Other ascites: Secondary | ICD-10-CM | POA: Insufficient documentation

## 2019-08-06 LAB — BODY FLUID CELL COUNT WITH DIFFERENTIAL
Eos, Fluid: 2 %
Lymphs, Fluid: 69 %
Monocyte-Macrophage-Serous Fluid: 23 %
Neutrophil Count, Fluid: 6 %
Total Nucleated Cell Count, Fluid: 540 cu mm

## 2019-08-06 LAB — PROTEIN, PLEURAL OR PERITONEAL FLUID: Total protein, fluid: 4.5 g/dL

## 2019-08-06 LAB — ALBUMIN, PLEURAL OR PERITONEAL FLUID: Albumin, Fluid: 2.4 g/dL

## 2019-08-09 LAB — BODY FLUID CULTURE: Culture: NO GROWTH

## 2019-08-09 LAB — CYTOLOGY - NON PAP

## 2019-08-19 ENCOUNTER — Other Ambulatory Visit
Admission: RE | Admit: 2019-08-19 | Discharge: 2019-08-19 | Disposition: A | Discharge status: Home / Self Care | Payer: Medicare Other | Source: Ambulatory Visit | Attending: Gastroenterology | Admitting: Gastroenterology

## 2019-08-19 ENCOUNTER — Other Ambulatory Visit: Payer: Self-pay | Admitting: Gastroenterology

## 2019-08-19 DIAGNOSIS — I5040 Unspecified combined systolic (congestive) and diastolic (congestive) heart failure: Secondary | ICD-10-CM | POA: Diagnosis present

## 2019-08-19 DIAGNOSIS — K7460 Unspecified cirrhosis of liver: Secondary | ICD-10-CM

## 2019-08-19 DIAGNOSIS — R188 Other ascites: Secondary | ICD-10-CM

## 2019-08-19 LAB — BRAIN NATRIURETIC PEPTIDE: B Natriuretic Peptide: 722 pg/mL — ABNORMAL HIGH (ref 0.0–100.0)

## 2019-08-20 ENCOUNTER — Ambulatory Visit
Admission: RE | Admit: 2019-08-20 | Discharge: 2019-08-20 | Disposition: A | Discharge status: Home / Self Care | Payer: Medicare Other | Source: Ambulatory Visit | Attending: Gastroenterology | Admitting: Gastroenterology

## 2019-08-20 ENCOUNTER — Telehealth: Payer: Self-pay | Admitting: Family

## 2019-08-20 ENCOUNTER — Other Ambulatory Visit: Payer: Self-pay

## 2019-08-20 ENCOUNTER — Other Ambulatory Visit: Payer: Self-pay | Admitting: Gastroenterology

## 2019-08-20 DIAGNOSIS — K7460 Unspecified cirrhosis of liver: Secondary | ICD-10-CM | POA: Insufficient documentation

## 2019-08-20 DIAGNOSIS — R188 Other ascites: Secondary | ICD-10-CM | POA: Diagnosis not present

## 2019-08-20 NOTE — Telephone Encounter (Signed)
Spoke with nurse at [facility] regarding patient status. Patients BMP was over 700, Hemaglobin at 8, and pressure running between 900-100. She overall is not feeling well and there was question if she needed to be seen sooner. She has an appointment on Tuesday but was told to call on Monday if she needs to come sooner otherwise was advised to go to ER over the weekend if needed.   Alyse Low, [place]

## 2019-08-21 NOTE — Progress Notes (Signed)
Patient ID: Heather Peterson, female    DOB: 20-Sep-1940, 79 y.o.   MRN: [id number]  HPI  Heather Peterson is a 79 y/o female with a history of CAD, DM, HTN, CKD, thyroid disease, anxiety, depression, prolonged QT, current tobacco use and chronic heart failure.   Echo report from 04/20/2019 reviewed and showed an EF of 40% along with mild TR and moderate MR.  Catheterization done 03/11/17 showed:  Ost LM lesion, 50 %stenosed. - Eccentric Heavily Calcified lesion - MLA 7.5 mm2, FFR 0.93 - Not physiologically significant.  Prox LAD to Dist LAD Stented Segment, 0 %stenosed.  Dist LAD lesion, 90 %stenosed. Beyond Stent - Med Rx.  Prox Cx to Mid Cx Stent, 0 %stenosed.  Ost 2nd Mrg to 2nd Mrg lesion, 50 %stenosed. Beyond Stent  Was in the ED 07/16/19 due to weakness. Given some IVF's and Heather Peterson was released. Admitted 07/02/19 due to acute kidney injury along with anemia. Cardiology and GI consults obtained. Diuretic held due to dehydration. Given 1 unit of PRBC's. Chronic prolonged QTc. Discharged after 3 days.   Heather Peterson presents today for a follow-up visit with a chief complaint of moderate shortness of breath upon minimal exertion. Heather Peterson describes this as chronic in nature having been present for several years. Heather Peterson has associated decreased appetite, fatigue, abdominal distention, dizziness and difficulty sleeping along with this. Heather Peterson denies any palpitations, pedal edema, chest pain, cough or weight gain.   Taking midodrine 1-2 times/ day based on SBP <100. Had abdominal ultrasound recently done but no need for paracentesis. Having colonoscopy and EGD done next week.  Past Medical History:  Diagnosis Date  . Anxiety   . Chronic combined systolic and diastolic CHF (congestive heart failure) ([place])   . Chronic kidney disease   . Coronary artery disease   . Depression   . Diabetes mellitus without complication ([place])   . Diabetes mellitus, type II ([place])   . Hypertension   . MI (myocardial infarction) ([place])     x 5  . Pacemaker   . Prolonged Q-T interval on ECG   . Thyroid disease    Past Surgical History:  Procedure Laterality Date  . CENTRAL LINE INSERTION  03/11/2017   Procedure: CENTRAL LINE INSERTION;  Surgeon: Leonie Man, MD;  Location: [facility];  Service: Cardiovascular;;  . CHOLECYSTECTOMY    . CORONARY STENT INTERVENTION W/IMPELLA N/A 03/11/2017   Procedure: Coronary Stent Intervention w/Impella;  Surgeon: Leonie Man, MD;  Location: [facility];  Service: Cardiovascular;  Laterality: N/A;  . EYE SURGERY    . INTRAVASCULAR PRESSURE WIRE/FFR STUDY N/A 03/11/2017   Procedure: INTRAVASCULAR PRESSURE WIRE/FFR STUDY;  Surgeon: Leonie Man, MD;  Location: [facility];  Service: Cardiovascular;  Laterality: N/A;  . INTRAVASCULAR ULTRASOUND/IVUS N/A 03/11/2017   Procedure: Intravascular Ultrasound/IVUS;  Surgeon: Leonie Man, MD;  Location: [facility];  Service: Cardiovascular;  Laterality: N/A;  . LEFT HEART CATH AND CORONARY ANGIOGRAPHY N/A 03/05/2017   Procedure: LEFT HEART CATH AND CORONARY ANGIOGRAPHY;  Surgeon: Isaias Cowman, MD;  Location: [facility];  Service: Cardiovascular;  Laterality: N/A;  . PACEMAKER IMPLANT    . pacemaker/defibrillator Left    Family History  Problem Relation Age of Onset  . Hypertension Father   . Heart attack Father   . Depression Sister   . Depression Brother   . Depression Brother    Social History   Tobacco Use  . Smoking  status: Current Every Day Smoker    Packs/day: 0.25    Types: E-cigarettes, Cigarettes  . Smokeless tobacco: Never Used  Substance Use Topics  . Alcohol use: Not Currently    Comment: occasionally   Allergies  Allergen Reactions  . Celebrex [Celecoxib] Anaphylaxis  . Glipizide Anaphylaxis  . Lisinopril Swelling  . Sulfa Antibiotics Other (See Comments) and Anaphylaxis    Reaction: unknown  . Levaquin [Levofloxacin In D5w] Other (See Comments)     Heart arrhthymias  . Levofloxacin Other (See Comments)    ICD fired  . Metformin Other (See Comments)    Gi tolerance   . Penicillins Rash and Other (See Comments)    Has patient had a PCN reaction causing immediate rash, facial/tongue/throat swelling, SOB or lightheadedness with hypotension: Unknown Has patient had a PCN reaction causing severe rash involving mucus membranes or skin necrosis: No Has patient had a PCN reaction that required hospitalization: No Has patient had a PCN reaction occurring within the last 10 years: No If all of the above answers are "NO", then may proceed with Cephalosporin use.    Prior to Admission medications   Medication Sig Start Date End Date Taking? Authorizing Provider  acetaminophen (TYLENOL) 500 MG tablet Take 500 mg by mouth every 4 (four) hours as needed for mild pain, fever or headache.    Yes [provider]  albuterol (PROVENTIL HFA;VENTOLIN HFA) 108 (90 Base) MCG/ACT inhaler Inhale 2 puffs into the lungs every 4 (four) hours as needed for wheezing or shortness of breath. 02/05/17  Yes Gouru, Illene Silver, MD  aspirin EC 81 MG tablet Take 81 mg by mouth daily.   Yes [provider]  ferrous sulfate 324 MG TBEC Take 324 mg by mouth daily.   Yes [provider]  fexofenadine (ALLEGRA) 180 MG tablet Take 180 mg by mouth daily.   Yes [provider]  FLUoxetine (PROZAC) 10 MG capsule Take 10 mg by mouth daily. 06/29/19  Yes [provider]  furosemide (LASIX) 40 MG tablet Take 40 mg by mouth daily.   Yes [provider]  LANTUS SOLOSTAR 100 UNIT/ML Solostar Pen Inject 16 Units into the skin at bedtime. 06/09/19  Yes [provider]  levothyroxine (SYNTHROID) 50 MCG tablet Take 50 mcg by mouth See admin instructions. Take 1 tablet (13mg) by mouth every Tuesday, Thursday, Saturday and Sunday before breakfast   Yes [provider]  levothyroxine (SYNTHROID, LEVOTHROID) 75 MCG tablet Take 75 mcg  by mouth See admin instructions. Take 1 tablet (769m) by mouth every Monday, Wednesday and Friday morning before breakfast   Yes [provider]  magnesium oxide (MAG-OX) 400 MG tablet Take 200 mg by mouth daily.   Yes [provider]  mexiletine (MEXITIL) 200 MG capsule Take 1 capsule (200 mg total) by mouth every 12 (twelve) hours. 03/14/17  Yes Seiler, Amber K, NP  midodrine (PROAMATINE) 5 MG tablet Take 5 mg by mouth as needed.   Yes [provider]  pantoprazole (PROTONIX) 40 MG tablet Take 40 mg by mouth daily. 05/16/19  Yes [provider]  Prenatal Vit-Fe Fumarate-FA (PRENATAL MULTIVITAMIN) TABS tablet Take 1 tablet by mouth daily.   Yes [provider]  ranolazine (RANEXA) 500 MG 12 hr tablet Take 1 tablet (500 mg total) by mouth 2 (two) times daily. 03/14/17  Yes Seiler, Amber K, NP  simvastatin (ZOCOR) 40 MG tablet Take 20 mg by mouth at bedtime.    Yes [provider]  sodium chloride (OCEAN) 0.65 % SOLN nasal spray Place 1 spray into both nostrils as needed for congestion. 07/05/19  Yes Fritzi Mandes, MD  traZODone (DESYREL) 50 MG tablet Take 50 mg by mouth at bedtime.   Yes [provider]  triamcinolone (NASACORT) 55 MCG/ACT AERO nasal inhaler Place 2 sprays into the nose 2 (two) times daily. 07/05/19  Yes Fritzi Mandes, MD    Review of Systems  Constitutional: Positive for appetite change (decreased) and fatigue.  HENT: Positive for congestion. Negative for postnasal drip and sore throat.   Eyes: Negative.   Respiratory: Positive for shortness of breath (easily). Negative for cough.   Cardiovascular: Negative for chest pain, palpitations and leg swelling.  Gastrointestinal: Positive for abdominal distention. Negative for abdominal pain.  Endocrine: Negative.   Genitourinary: Negative.   Musculoskeletal: Positive for arthralgias (restless leg) and back pain.  Skin: Negative.   Allergic/Immunologic: Negative.   Neurological:  Positive for dizziness. Negative for light-headedness.  Hematological: Negative for adenopathy. Does not bruise/bleed easily.  Psychiatric/Behavioral: Positive for sleep disturbance (wearing oxygen at 2L around the clock). Negative for dysphoric mood. The patient is not nervous/anxious.    Vitals:   08/24/19 1019  BP: 109/62  Pulse: 79  Resp: 16  SpO2: 94%  Weight: 196 lb 6 oz (89.1 kg)  Height: 5' 4"  (1.626 m)  PF: (!) 2 L/min   Wt Readings from Last 3 Encounters:  08/24/19 196 lb 6 oz (89.1 kg)  07/16/19 193 lb (87.5 kg)  07/14/19 196 lb (88.9 kg)   Lab Results  Component Value Date   CREATININE 2.22 (H) 07/16/2019   CREATININE 1.95 (H) 07/14/2019   CREATININE 1.81 (H) 07/05/2019    Physical Exam Vitals and nursing note reviewed.  Constitutional:      Appearance: Heather Peterson is well-developed.  HENT:     Head: Normocephalic and atraumatic.  Neck:     Vascular: No JVD.  Cardiovascular:     Rate and Rhythm: Normal rate and regular rhythm.  Pulmonary:     Effort: Pulmonary effort is normal. No respiratory distress.     Breath sounds: No wheezing or rales.  Abdominal:     General: There is distension.     Tenderness: There is no abdominal tenderness.  Musculoskeletal:     Cervical back: Normal range of motion and neck supple.     Right lower leg: No tenderness. No edema.     Left lower leg: No tenderness. No edema.  Skin:    General: Skin is warm and dry.  Neurological:     General: No focal deficit present.     Mental Status: Heather Peterson is alert and oriented to person, place, and time.  Psychiatric:        Mood and Affect: Mood normal.        Behavior: Behavior normal.    Assessment & Plan:  1: Chronic heart failure with reduced ejection fraction- - NYHA class III - weighing daily; reminded her to call for an overnight weight gain of >2 pounds or a weekly weight gain of >5 pounds - weight stable from last visit here 1 month ago - not adding salt and Heather Peterson was encouraged to  read food labels for sodium content carefully - saw cardiology (Paraschos) 07/19/19 - paracentesis done 08/06/19 with resultant 1L removed; repeat on 08/20/19 without ascites - has ICD present & it has fired in the past - reports receiving her flu vaccine for this season - consider changing diuretic to torsemide  at her next visit after her GI workup  and hematology appt - BNP 08/19/19 was 722.0 - swelling with lisinopril - PharmD reconciled medications with the patient  2: HTN- - BP looks good today - saw PCP (Feldpausch) 07/26/19 - BMP 07/20/19 reviewed and showed sodium 133, potassium 4.7, creatinine 2.14 and GFR 22  3: DM- - saw endocrinology Pasty Arch) 06/08/19 - A1c 07/02/19 was 7.2% - fasting glucose at home in clinic today was 100 - saw nephrology Maia Breslow) 07/28/19  4: Anemia- - received unit of PRBC's yesterday - Hemoglobin 08/23/19 was 9.1 (was 8.18 July 2019) - saw GI Alice Reichert) 08/19/19 - EGD / colonoscopy are scheduled; plavix stopped per cardiology for now   Medication list was reviewed.   Return in 3 weeks or sooner for any questions/problems before then.

## 2019-08-23 ENCOUNTER — Ambulatory Visit
Admission: RE | Admit: 2019-08-23 | Discharge: 2019-08-23 | Disposition: A | Discharge status: Home / Self Care | Payer: Medicare Other | Source: Ambulatory Visit | Attending: Gastroenterology | Admitting: Gastroenterology

## 2019-08-23 ENCOUNTER — Other Ambulatory Visit: Payer: Self-pay

## 2019-08-23 DIAGNOSIS — D649 Anemia, unspecified: Secondary | ICD-10-CM | POA: Diagnosis not present

## 2019-08-23 DIAGNOSIS — K7460 Unspecified cirrhosis of liver: Secondary | ICD-10-CM | POA: Diagnosis not present

## 2019-08-23 DIAGNOSIS — R188 Other ascites: Secondary | ICD-10-CM | POA: Diagnosis not present

## 2019-08-23 DIAGNOSIS — R195 Other fecal abnormalities: Secondary | ICD-10-CM | POA: Insufficient documentation

## 2019-08-23 LAB — HEMOGLOBIN: Hemoglobin: 9.1 g/dL — ABNORMAL LOW (ref 12.0–15.0)

## 2019-08-23 MED ORDER — SODIUM CHLORIDE 0.9 % IV SOLN: INTRAVENOUS | Status: DC

## 2019-08-23 NOTE — Telephone Encounter (Signed)
Error: Blood Pressure Running Between 90-100.    Heather Peterson, [place]

## 2019-08-24 ENCOUNTER — Encounter: Payer: Self-pay | Admitting: Family

## 2019-08-24 ENCOUNTER — Ambulatory Visit: Payer: Medicare Other | Attending: Family | Admitting: Family

## 2019-08-24 VITALS — BP 109/62 | HR 79 | Resp 16 | Ht 64.0 in | Wt 196.4 lb

## 2019-08-24 DIAGNOSIS — Z8249 Family history of ischemic heart disease and other diseases of the circulatory system: Secondary | ICD-10-CM | POA: Insufficient documentation

## 2019-08-24 DIAGNOSIS — Z881 Allergy status to other antibiotic agents status: Secondary | ICD-10-CM | POA: Insufficient documentation

## 2019-08-24 DIAGNOSIS — I130 Hypertensive heart and chronic kidney disease with heart failure and stage 1 through stage 4 chronic kidney disease, or unspecified chronic kidney disease: Secondary | ICD-10-CM | POA: Insufficient documentation

## 2019-08-24 DIAGNOSIS — F419 Anxiety disorder, unspecified: Secondary | ICD-10-CM | POA: Insufficient documentation

## 2019-08-24 DIAGNOSIS — E1122 Type 2 diabetes mellitus with diabetic chronic kidney disease: Secondary | ICD-10-CM | POA: Insufficient documentation

## 2019-08-24 DIAGNOSIS — Z950 Presence of cardiac pacemaker: Secondary | ICD-10-CM | POA: Diagnosis not present

## 2019-08-24 DIAGNOSIS — D649 Anemia, unspecified: Secondary | ICD-10-CM | POA: Insufficient documentation

## 2019-08-24 DIAGNOSIS — Z888 Allergy status to other drugs, medicaments and biological substances status: Secondary | ICD-10-CM | POA: Diagnosis not present

## 2019-08-24 DIAGNOSIS — Z955 Presence of coronary angioplasty implant and graft: Secondary | ICD-10-CM | POA: Insufficient documentation

## 2019-08-24 DIAGNOSIS — Z880 Allergy status to penicillin: Secondary | ICD-10-CM | POA: Diagnosis not present

## 2019-08-24 DIAGNOSIS — N1832 Chronic kidney disease, stage 3b: Secondary | ICD-10-CM

## 2019-08-24 DIAGNOSIS — Z794 Long term (current) use of insulin: Secondary | ICD-10-CM | POA: Insufficient documentation

## 2019-08-24 DIAGNOSIS — Z886 Allergy status to analgesic agent status: Secondary | ICD-10-CM | POA: Insufficient documentation

## 2019-08-24 DIAGNOSIS — E079 Disorder of thyroid, unspecified: Secondary | ICD-10-CM | POA: Insufficient documentation

## 2019-08-24 DIAGNOSIS — I2510 Atherosclerotic heart disease of native coronary artery without angina pectoris: Secondary | ICD-10-CM | POA: Diagnosis not present

## 2019-08-24 DIAGNOSIS — Z7982 Long term (current) use of aspirin: Secondary | ICD-10-CM | POA: Diagnosis not present

## 2019-08-24 DIAGNOSIS — N189 Chronic kidney disease, unspecified: Secondary | ICD-10-CM | POA: Diagnosis not present

## 2019-08-24 DIAGNOSIS — I252 Old myocardial infarction: Secondary | ICD-10-CM | POA: Diagnosis not present

## 2019-08-24 DIAGNOSIS — I5042 Chronic combined systolic (congestive) and diastolic (congestive) heart failure: Secondary | ICD-10-CM | POA: Insufficient documentation

## 2019-08-24 DIAGNOSIS — I5022 Chronic systolic (congestive) heart failure: Secondary | ICD-10-CM

## 2019-08-24 DIAGNOSIS — Z79899 Other long term (current) drug therapy: Secondary | ICD-10-CM | POA: Diagnosis not present

## 2019-08-24 DIAGNOSIS — F17210 Nicotine dependence, cigarettes, uncomplicated: Secondary | ICD-10-CM | POA: Insufficient documentation

## 2019-08-24 DIAGNOSIS — Z79890 Hormone replacement therapy (postmenopausal): Secondary | ICD-10-CM | POA: Diagnosis not present

## 2019-08-24 DIAGNOSIS — F329 Major depressive disorder, single episode, unspecified: Secondary | ICD-10-CM | POA: Insufficient documentation

## 2019-08-24 DIAGNOSIS — Z882 Allergy status to sulfonamides status: Secondary | ICD-10-CM | POA: Diagnosis not present

## 2019-08-24 DIAGNOSIS — I10 Essential (primary) hypertension: Secondary | ICD-10-CM

## 2019-08-24 LAB — TYPE AND SCREEN
ABO/RH(D): B POS
Antibody Screen: NEGATIVE
Unit division: 0

## 2019-08-24 LAB — BPAM RBC
Blood Product Expiration Date: 202104182359
ISSUE DATE / TIME: 202104121124
Unit Type and Rh: 7300

## 2019-08-24 LAB — GLUCOSE, CAPILLARY: Glucose-Capillary: 100 mg/dL — ABNORMAL HIGH (ref 70–99)

## 2019-08-24 LAB — PREPARE RBC (CROSSMATCH)

## 2019-08-24 NOTE — Patient Instructions (Signed)
Continue weighing daily and call for an overnight weight gain of > 2 pounds or a weekly weight gain of >5 pounds. 

## 2019-08-24 NOTE — Progress Notes (Signed)
[facility] BRIEF NOTE  ADHERENCE ASSESSMENT  Pharmacist reviewed and updated medication list. Patient reports good medication adherence using a sheet to help keep track of medications. Additionally, her daughter manages her medication. Per patient, she stopped taking Plavix d/t paracentesis and was not told to resume medication. At her recent visit with the nephrologist, metoprolol and spironolactone were stopped. Recently, resumed lasix 40 mg daily, per patient. Patient reports using midodrine about once daily, but on home record it appears to be ~ 1-2 times per day. Finally, patient reports having trouble affording Ranexa and Mexitil as they each cost ~$100; patient's insurance is [employer] plan G.   Patient denies SOB (outside of her usual baseline; reports SOB is not worsened), peripheral edema, or using a pillow to incline her back. However, patient uses a pillow to elevate her legs. Patient checks weight daily, but feels her scale at home has inaccurate readings. Overall, home weights are stable with some weight gain.   Per chart review, recent BNP 722. Clinic BP and home BP are soft.   PLAN 1. Counseled on when to call the HF clinic with regard to weight gain/fluid changes.   2. D/w Otila Kluver issues affording medications. Patient is not a candidate for medication management since she has insurance.   3. If clinically appropriate, d/w Tina reducing lasix.     Time spent: 23 minutes (1017- 1040)  [place], Pharm.D. Clinical Pharmacist 08/24/2019 11:23 AM    Current Outpatient Medications:  .  acetaminophen (TYLENOL) 500 MG tablet, Take 500 mg by mouth every 4 (four) hours as needed for mild pain, fever or headache. , Disp: , Rfl:  .  albuterol (PROVENTIL HFA;VENTOLIN HFA) 108 (90 Base) MCG/ACT inhaler, Inhale 2 puffs into the lungs every 4 (four) hours as needed for wheezing or shortness of breath., Disp: 1 Inhaler, Rfl: 1 .   aspirin EC 81 MG tablet, Take 81 mg by mouth daily., Disp: , Rfl:  .  ferrous sulfate 324 MG TBEC, Take 324 mg by mouth daily., Disp: , Rfl:  .  fexofenadine (ALLEGRA) 180 MG tablet, Take 180 mg by mouth daily., Disp: , Rfl:  .  FLUoxetine (PROZAC) 10 MG capsule, Take 10 mg by mouth daily., Disp: , Rfl:  .  furosemide (LASIX) 40 MG tablet, Take 40 mg by mouth daily., Disp: , Rfl:  .  LANTUS SOLOSTAR 100 UNIT/ML Solostar Pen, Inject 16 Units into the skin at bedtime., Disp: , Rfl:  .  levothyroxine (SYNTHROID) 50 MCG tablet, Take 50 mcg by mouth See admin instructions. Take 1 tablet (53mg) by mouth every Tuesday, Thursday, Saturday and Sunday before breakfast, Disp: , Rfl:  .  levothyroxine (SYNTHROID, LEVOTHROID) 75 MCG tablet, Take 75 mcg by mouth See admin instructions. Take 1 tablet (771m) by mouth every Monday, Wednesday and Friday morning before breakfast, Disp: , Rfl:  .  magnesium oxide (MAG-OX) 400 MG tablet, Take 200 mg by mouth daily., Disp: , Rfl:  .  mexiletine (MEXITIL) 200 MG capsule, Take 1 capsule (200 mg total) by mouth every 12 (twelve) hours., Disp: 60 capsule, Rfl: 1 .  midodrine (PROAMATINE) 5 MG tablet, Take 5 mg by mouth as needed., Disp: , Rfl:  .  pantoprazole (PROTONIX) 40 MG tablet, Take 40 mg by mouth daily., Disp: , Rfl:  .  Prenatal Vit-Fe Fumarate-FA (PRENATAL MULTIVITAMIN) TABS tablet, Take 1 tablet by mouth daily., Disp: , Rfl:  .  ranolazine (RANEXA) 500  MG 12 hr tablet, Take 1 tablet (500 mg total) by mouth 2 (two) times daily., Disp: 60 tablet, Rfl: 1 .  simvastatin (ZOCOR) 40 MG tablet, Take 20 mg by mouth at bedtime. , Disp: , Rfl: 2 .  sodium chloride (OCEAN) 0.65 % SOLN nasal spray, Place 1 spray into both nostrils as needed for congestion., Disp: 15 mL, Rfl: 0 .  traZODone (DESYREL) 50 MG tablet, Take 50 mg by mouth at bedtime., Disp: , Rfl:  .  triamcinolone (NASACORT) 55 MCG/ACT AERO nasal inhaler, Place 2 sprays into the nose 2 (two) times daily., Disp:  1 Inhaler, Rfl: 0   COUNSELING POINTS/CLINICAL PEARLS   Furosemide  Drug causes sun-sensitivity. Advise patient to use sunscreen and avoid tanning beds. Patient should avoid activities requiring coordination until drug effects are realized, as drug may cause dizziness, vertigo, or blurred vision. This drug may cause hyperglycemia, hyperuricemia, constipation, diarrhea, loss of appetite, nausea, vomiting, purpuric disorder, cramps, spasticity, asthenia, headache, paresthesia, or scaling eczema. Instruct patient to report unusual bleeding/bruising or signs/symptoms of hypotension, infection, pancreatitis, or ototoxicity (tinnitus, hearing impairment). Advise patient to report signs/symptoms of a severe skin reactions (flu-like symptoms, spreading red rash, or skin/mucous membrane blistering) or erythema multiforme. Instruct patient to eat high-potassium foods during drug therapy, as directed by healthcare professional.  Patient should not drink alcohol while taking this drug.   DRUGS TO AVOID IN HEART FAILURE  Drug or Class Mechanism  Analgesics . NSAIDs . COX-2 inhibitors . Glucocorticoids  Sodium and water retention, increased systemic vascular resistance, decreased response to diuretics   Diabetes Medications . Metformin . Thiazolidinediones o Rosiglitazone (Avandia) o Pioglitazone (Actos) . DPP4 Inhibitors o Saxagliptin (Onglyza) o Sitagliptin (Januvia)   Lactic acidosis Possible calcium channel blockade   Unknown  Antiarrhythmics . Class I  o Flecainide o Disopyramide . Class III o Sotalol . Other o Dronedarone  Negative inotrope, proarrhythmic   Proarrhythmic, beta blockade  Negative inotrope  Antihypertensives . Alpha Blockers o Doxazosin . Calcium Channel Blockers o Diltiazem o Verapamil o Nifedipine . Central Alpha Adrenergics o Moxonidine . Peripheral Vasodilators o Minoxidil  Increases renin and aldosterone  Negative inotrope    Possible  sympathetic withdrawal  Unknown  Anti-infective . Itraconazole . Amphotericin B  Negative inotrope Unknown  Hematologic . Anagrelide . Cilostazol   Possible inhibition of PD IV Inhibition of PD III causing arrhythmias  Neurologic/Psychiatric . Stimulants . Anti-Seizure Drugs o Carbamazepine o Pregabalin . Antidepressants o Tricyclics o Citalopram . Parkinsons o Bromocriptine o Pergolide o Pramipexole . Antipsychotics o Clozapine . Antimigraine o Ergotamine o Methysergide . Appetite suppressants . Bipolar o Lithium  Peripheral alpha and beta agonist activity  Negative inotrope and chronotrope Calcium channel blockade  Negative inotrope, proarrhythmic Dose-dependent QT prolongation  Excessive serotonin activity/valvular damage Excessive serotonin activity/valvular damage Unknown  IgE mediated hypersensitivy, calcium channel blockade  Excessive serotonin activity/valvular damage Excessive serotonin activity/valvular damage Valvular damage  Direct myofibrillar degeneration, adrenergic stimulation  Antimalarials . Chloroquine . Hydroxychloroquine Intracellular inhibition of lysosomal enzymes  Urologic Agents . Alpha Blockers o Doxazosin o Prazosin o Tamsulosin o Terazosin  Increased renin and aldosterone  Adapted from Page RL, et al. "Drugs That May Cause or Exacerbate Heart Failure: A Scientific Statement from the [facility]." Circulation 2016; 889:V69-I50. DOI: 10.1161/CIR.0000000000000426   MEDICATION ADHERENCES TIPS AND STRATEGIES 1. Taking medication as prescribed improves patient outcomes in heart failure (reduces hospitalizations, improves symptoms, increases survival) 2. Side effects of medications can be managed by  decreasing doses, switching agents, stopping drugs, or adding additional therapy. Please let someone in the [facility] know if you have having bothersome side effects so we can modify your regimen. Do not  alter your medication regimen without talking to [place].  3. Medication reminders can help patients remember to take drugs on time. If you are missing or forgetting doses you can try linking behaviors, using pill boxes, or an electronic reminder like an alarm on your phone or an app. Some people can also get automated phone calls as medication reminders.

## 2019-08-27 ENCOUNTER — Other Ambulatory Visit: Payer: Self-pay

## 2019-08-27 ENCOUNTER — Telehealth: Payer: Self-pay | Admitting: Family

## 2019-08-27 ENCOUNTER — Inpatient Hospital Stay: Payer: Medicare Other | Attending: Oncology | Admitting: Oncology

## 2019-08-27 ENCOUNTER — Encounter: Payer: Self-pay | Admitting: Oncology

## 2019-08-27 ENCOUNTER — Encounter: Payer: Self-pay | Admitting: Emergency Medicine

## 2019-08-27 ENCOUNTER — Emergency Department
Admission: EM | Admit: 2019-08-27 | Discharge: 2019-08-27 | Disposition: A | Discharge status: Home / Self Care | Payer: Medicare Other | Attending: Emergency Medicine | Admitting: Emergency Medicine

## 2019-08-27 ENCOUNTER — Emergency Department: Payer: Medicare Other

## 2019-08-27 ENCOUNTER — Inpatient Hospital Stay: Payer: Medicare Other

## 2019-08-27 VITALS — BP 112/69 | HR 72 | Temp 95.0°F | Resp 20 | Wt 199.5 lb

## 2019-08-27 DIAGNOSIS — D649 Anemia, unspecified: Secondary | ICD-10-CM | POA: Diagnosis not present

## 2019-08-27 DIAGNOSIS — R2243 Localized swelling, mass and lump, lower limb, bilateral: Secondary | ICD-10-CM | POA: Diagnosis not present

## 2019-08-27 DIAGNOSIS — R0602 Shortness of breath: Secondary | ICD-10-CM | POA: Insufficient documentation

## 2019-08-27 DIAGNOSIS — R309 Painful micturition, unspecified: Secondary | ICD-10-CM | POA: Diagnosis not present

## 2019-08-27 DIAGNOSIS — Z5321 Procedure and treatment not carried out due to patient leaving prior to being seen by health care provider: Secondary | ICD-10-CM | POA: Diagnosis not present

## 2019-08-27 LAB — CBC
HCT: 29.5 % — ABNORMAL LOW (ref 36.0–46.0)
Hemoglobin: 9.3 g/dL — ABNORMAL LOW (ref 12.0–15.0)
MCH: 30.6 pg (ref 26.0–34.0)
MCHC: 31.5 g/dL (ref 30.0–36.0)
MCV: 97 fL (ref 80.0–100.0)
Platelets: 235 10*3/uL (ref 150–400)
RBC: 3.04 MIL/uL — ABNORMAL LOW (ref 3.87–5.11)
RDW: 21.6 % — ABNORMAL HIGH (ref 11.5–15.5)
WBC: 10.2 10*3/uL (ref 4.0–10.5)
nRBC: 0.2 % (ref 0.0–0.2)

## 2019-08-27 LAB — CBC WITH DIFFERENTIAL/PLATELET
Abs Immature Granulocytes: 0.1 10*3/uL — ABNORMAL HIGH (ref 0.00–0.07)
Basophils Absolute: 0.1 10*3/uL (ref 0.0–0.1)
Basophils Relative: 1 %
Eosinophils Absolute: 0.1 10*3/uL (ref 0.0–0.5)
Eosinophils Relative: 1 %
HCT: 29.9 % — ABNORMAL LOW (ref 36.0–46.0)
Hemoglobin: 9.6 g/dL — ABNORMAL LOW (ref 12.0–15.0)
Immature Granulocytes: 1 %
Lymphocytes Relative: 16 %
Lymphs Abs: 1.6 10*3/uL (ref 0.7–4.0)
MCH: 30.7 pg (ref 26.0–34.0)
MCHC: 32.1 g/dL (ref 30.0–36.0)
MCV: 95.5 fL (ref 80.0–100.0)
Monocytes Absolute: 0.9 10*3/uL (ref 0.1–1.0)
Monocytes Relative: 9 %
Neutro Abs: 6.8 10*3/uL (ref 1.7–7.7)
Neutrophils Relative %: 72 %
Platelets: 236 10*3/uL (ref 150–400)
RBC: 3.13 MIL/uL — ABNORMAL LOW (ref 3.87–5.11)
RDW: 21.6 % — ABNORMAL HIGH (ref 11.5–15.5)
Smear Review: NORMAL
WBC: 9.5 10*3/uL (ref 4.0–10.5)
nRBC: 0 % (ref 0.0–0.2)

## 2019-08-27 LAB — URINALYSIS, COMPLETE (UACMP) WITH MICROSCOPIC
Bacteria, UA: NONE SEEN
Bilirubin Urine: NEGATIVE
Glucose, UA: NEGATIVE mg/dL
Hgb urine dipstick: NEGATIVE
Ketones, ur: NEGATIVE mg/dL
Leukocytes,Ua: NEGATIVE
Nitrite: NEGATIVE
Protein, ur: NEGATIVE mg/dL
Specific Gravity, Urine: 1.008 (ref 1.005–1.030)
pH: 6 (ref 5.0–8.0)

## 2019-08-27 LAB — IRON AND TIBC
Iron: 90 ug/dL (ref 28–170)
Saturation Ratios: 22 % (ref 10.4–31.8)
TIBC: 407 ug/dL (ref 250–450)
UIBC: 317 ug/dL

## 2019-08-27 LAB — COMPREHENSIVE METABOLIC PANEL
ALT: 13 U/L (ref 0–44)
AST: 22 U/L (ref 15–41)
Albumin: 3.7 g/dL (ref 3.5–5.0)
Alkaline Phosphatase: 88 U/L (ref 38–126)
Anion gap: 11 (ref 5–15)
BUN: 36 mg/dL — ABNORMAL HIGH (ref 8–23)
CO2: 27 mmol/L (ref 22–32)
Calcium: 8.9 mg/dL (ref 8.9–10.3)
Chloride: 99 mmol/L (ref 98–111)
Creatinine, Ser: 2.29 mg/dL — ABNORMAL HIGH (ref 0.44–1.00)
GFR calc Af Amer: 23 mL/min — ABNORMAL LOW (ref >60)
GFR calc non Af Amer: 20 mL/min — ABNORMAL LOW (ref >60)
Glucose, Bld: 108 mg/dL — ABNORMAL HIGH (ref 70–99)
Potassium: 4.1 mmol/L (ref 3.5–5.1)
Sodium: 137 mmol/L (ref 135–145)
Total Bilirubin: 1.1 mg/dL (ref 0.3–1.2)
Total Protein: 7.7 g/dL (ref 6.5–8.1)

## 2019-08-27 LAB — BRAIN NATRIURETIC PEPTIDE: B Natriuretic Peptide: 714 pg/mL — ABNORMAL HIGH (ref 0.0–100.0)

## 2019-08-27 LAB — DAT, POLYSPECIFIC AHG (ARMC ONLY): Polyspecific AHG test: NEGATIVE

## 2019-08-27 LAB — RETICULOCYTES
Immature Retic Fract: 26 % — ABNORMAL HIGH (ref 2.3–15.9)
RBC.: 3.05 MIL/uL — ABNORMAL LOW (ref 3.87–5.11)
Retic Count, Absolute: 101 10*3/uL (ref 19.0–186.0)
Retic Ct Pct: 3.3 % — ABNORMAL HIGH (ref 0.4–3.1)

## 2019-08-27 LAB — FERRITIN: Ferritin: 50 ng/mL (ref 11–307)

## 2019-08-27 LAB — FOLATE: Folate: >100 ng/mL (ref >5.9)

## 2019-08-27 LAB — LACTATE DEHYDROGENASE: LDH: 161 U/L (ref 98–192)

## 2019-08-27 LAB — VITAMIN B12: Vitamin B-12: 332 pg/mL (ref 180–914)

## 2019-08-27 MED ORDER — TORSEMIDE 20 MG PO TABS: 40.0000 mg | ORAL_TABLET | Freq: Every day | ORAL | 5 refills | Status: DC

## 2019-08-27 NOTE — ED Triage Notes (Signed)
Pt to ED via [facility] from home for shortness of breath and fluid retention. Pt states that she was was advised by her cardiologist and GI doctor that she needed to come to the ED because of increased shortness of breath and swelling in her legs and abdomen. Pt is on chronic O2 at 2 liters per min. Pt is in NAD.

## 2019-08-27 NOTE — Progress Notes (Addendum)
[place]  [phone] [phone]  ID: Jazzmin Newbold Ryer OB: Sep 18, 1940  MR#: [id number]  YBO#:[id number]  [name], MD as PCP - General (Family Medicine)  CHIEF COMPLAINT: Anemia, unspecified.  INTERVAL HISTORY: Patient is a 79 year old female with multiple medical problems who was referred to clinic for evaluation of chronic anemia.  She has chronic shortness of breath and requires oxygen.  She also has increased abdominal ascites as well as peripheral edema.  There is no neurologic complaints.  She denies any recent fevers.  She has a poor appetite, but has weight gain secondary to her fluid retention.  She denies any chest pain, cough, or hemoptysis.  She has increased nausea, but denies any vomiting, constipation, or diarrhea.  She has pain with urination, but denies hematuria.  Patient offers no further specific complaints today.  REVIEW OF SYSTEMS:   Review of Systems  Constitutional: Positive for malaise/fatigue. Negative for fever.  Respiratory: Positive for shortness of breath. Negative for cough and hemoptysis.   Cardiovascular: Positive for leg swelling. Negative for chest pain.  Gastrointestinal: Positive for nausea. Negative for abdominal pain and blood in stool.  Genitourinary: Positive for dysuria. Negative for hematuria.  Musculoskeletal: Negative.  Negative for back pain.  Skin: Negative.  Negative for rash.  Neurological: Positive for weakness. Negative for dizziness, focal weakness and headaches.  Psychiatric/Behavioral: Negative.  The patient is not nervous/anxious.     As per HPI. Otherwise, a complete review of systems is negative.  PAST MEDICAL HISTORY: Past Medical History:  Diagnosis Date  . Anxiety   . Chronic combined systolic and diastolic CHF (congestive heart failure) ([place])   . Chronic kidney disease   . Coronary artery disease   . Depression   . Diabetes mellitus without  complication ([place])   . Diabetes mellitus, type II ([place])   . Hypertension   . MI (myocardial infarction) ([place])    x 5  . Pacemaker   . Prolonged Q-T interval on ECG   . Thyroid disease     PAST SURGICAL HISTORY: Past Surgical History:  Procedure Laterality Date  . CENTRAL LINE INSERTION  03/11/2017   Procedure: CENTRAL LINE INSERTION;  Surgeon: Leonie Man, MD;  Location: [facility] [place] [facility];  Service: Cardiovascular;;  . CHOLECYSTECTOMY    . CORONARY STENT INTERVENTION W/IMPELLA N/A 03/11/2017   Procedure: Coronary Stent Intervention w/Impella;  Surgeon: Leonie Man, MD;  Location: [facility];  Service: Cardiovascular;  Laterality: N/A;  . EYE SURGERY    . INTRAVASCULAR PRESSURE WIRE/FFR STUDY N/A 03/11/2017   Procedure: INTRAVASCULAR PRESSURE WIRE/FFR STUDY;  Surgeon: Leonie Man, MD;  Location: [facility];  Service: Cardiovascular;  Laterality: N/A;  . INTRAVASCULAR ULTRASOUND/IVUS N/A 03/11/2017   Procedure: Intravascular Ultrasound/IVUS;  Surgeon: Leonie Man, MD;  Location: [facility];  Service: Cardiovascular;  Laterality: N/A;  . LEFT HEART CATH AND CORONARY ANGIOGRAPHY N/A 03/05/2017   Procedure: LEFT HEART CATH AND CORONARY ANGIOGRAPHY;  Surgeon: Isaias Cowman, MD;  Location: [place];  Service: Cardiovascular;  Laterality: N/A;  . PACEMAKER IMPLANT    . pacemaker/defibrillator Left     FAMILY HISTORY: Family History  Problem Relation Age of Onset  . Hypertension Father   . Heart attack Father   . Depression Sister   . Depression Brother   . Depression Brother     ADVANCED DIRECTIVES (Y/N):  N  HEALTH MAINTENANCE: Social History  Tobacco Use  . Smoking status: Current Every Day Smoker    Packs/day: 0.25    Types: E-cigarettes, Cigarettes  . Smokeless tobacco: Never Used  Substance Use Topics  . Alcohol use: Not Currently    Comment: occasionally  . Drug use: No     Colonoscopy:  PAP:  Bone  density:  Lipid panel:  Allergies  Allergen Reactions  . Celebrex [Celecoxib] Anaphylaxis  . Glipizide Anaphylaxis  . Lisinopril Swelling    Lip and facial swelling  . Sulfa Antibiotics Other (See Comments) and Anaphylaxis    Reaction: unknown  . Levaquin [Levofloxacin In D5w] Other (See Comments)    Heart arrhthymias  . Levofloxacin Other (See Comments)    ICD fired  . Metformin Other (See Comments)    Gi tolerance   . Penicillins Rash and Other (See Comments)    Has patient had a PCN reaction causing immediate rash, facial/tongue/throat swelling, SOB or lightheadedness with hypotension: Unknown Has patient had a PCN reaction causing severe rash involving mucus membranes or skin necrosis: No Has patient had a PCN reaction that required hospitalization: No Has patient had a PCN reaction occurring within the last 10 years: No If all of the above answers are "NO", then may proceed with Cephalosporin use.     Current Outpatient Medications  Medication Sig Dispense Refill  . acetaminophen (TYLENOL) 500 MG tablet Take 500 mg by mouth every 4 (four) hours as needed for mild pain, fever or headache.     . albuterol (PROVENTIL HFA;VENTOLIN HFA) 108 (90 Base) MCG/ACT inhaler Inhale 2 puffs into the lungs every 4 (four) hours as needed for wheezing or shortness of breath. 1 Inhaler 1  . aspirin EC 81 MG tablet Take 81 mg by mouth daily.    . fexofenadine (ALLEGRA) 180 MG tablet Take 180 mg by mouth daily.    Marland Kitchen FLUoxetine (PROZAC) 10 MG capsule Take 10 mg by mouth daily.    Marland Kitchen LANTUS SOLOSTAR 100 UNIT/ML Solostar Pen Inject 16 Units into the skin at bedtime.    Marland Kitchen levothyroxine (SYNTHROID) 50 MCG tablet Take 50 mcg by mouth See admin instructions. Take 1 tablet (85mg) by mouth every Tuesday, Thursday, Saturday and Sunday before breakfast    . levothyroxine (SYNTHROID, LEVOTHROID) 75 MCG tablet Take 75 mcg by mouth See admin instructions. Take 1 tablet (775m) by mouth every Monday, Wednesday  and Friday morning before breakfast    . magnesium oxide (MAG-OX) 400 MG tablet Take 200 mg by mouth daily.    . Marland Kitchenexiletine (MEXITIL) 200 MG capsule Take 1 capsule (200 mg total) by mouth every 12 (twelve) hours. 60 capsule 1  . midodrine (PROAMATINE) 5 MG tablet Take 5 mg by mouth as needed.    . OXYGEN Inhale 2 L into the lungs continuous.    . pantoprazole (PROTONIX) 40 MG tablet Take 40 mg by mouth daily.    . Prenatal Vit-Fe Fumarate-FA (PRENATAL MULTIVITAMIN) TABS tablet Take 1 tablet by mouth daily.    . ranolazine (RANEXA) 500 MG 12 hr tablet Take 1 tablet (500 mg total) by mouth 2 (two) times daily. 60 tablet 1  . simvastatin (ZOCOR) 40 MG tablet Take 20 mg by mouth at bedtime.   2  . sodium chloride (OCEAN) 0.65 % SOLN nasal spray Place 1 spray into both nostrils as needed for congestion. 15 mL 0  . traZODone (DESYREL) 50 MG tablet Take 50 mg by mouth at bedtime.    . triamcinolone (NASACORT) 55 MCG/ACT  AERO nasal inhaler Place 2 sprays into the nose 2 (two) times daily. 1 Inhaler 0  . clopidogrel (PLAVIX) 75 MG tablet Take 75 mg by mouth daily.    . ferrous sulfate 324 MG TBEC Take 324 mg by mouth daily.    Marland Kitchen torsemide (DEMADEX) 20 MG tablet Take 2 tablets (40 mg total) by mouth daily. 60 tablet 5   No current facility-administered medications for this visit.    OBJECTIVE: Vitals:   08/27/19 1128  BP: 112/69  Pulse: 72  Resp: 20  Temp: (!) 95 F (35 C)  SpO2: 98%     Body mass index is 34.24 kg/m.    ECOG FS:2 - Symptomatic, <50% confined to bed  General: Well-developed, well-nourished, no acute distress. Eyes: Pink conjunctiva, anicteric sclera. HEENT: Normocephalic, moist mucous membranes. Lungs: No audible wheezing or coughing. Heart: Regular rate and rhythm. Abdomen: Distended. Musculoskeletal: Bilateral lower extremity edema. Neuro: Alert, answering all questions appropriately. Cranial nerves grossly intact. Skin: No rashes or petechiae noted. Psych: Normal  affect. Lymphatics: No cervical, calvicular, axillary or inguinal LAD.   LAB RESULTS:  Lab Results  Component Value Date   NA 137 08/27/2019   K 4.1 08/27/2019   CL 99 08/27/2019   CO2 27 08/27/2019   GLUCOSE 108 (H) 08/27/2019   BUN 36 (H) 08/27/2019   CREATININE 2.29 (H) 08/27/2019   CALCIUM 8.9 08/27/2019   PROT 7.7 08/27/2019   ALBUMIN 3.7 08/27/2019   AST 22 08/27/2019   ALT 13 08/27/2019   ALKPHOS 88 08/27/2019   BILITOT 1.1 08/27/2019   GFRNONAA 20 (L) 08/27/2019   GFRAA 23 (L) 08/27/2019    Lab Results  Component Value Date   WBC 9.5 08/27/2019   NEUTROABS 6.8 08/27/2019   HGB 9.6 (L) 08/27/2019   HCT 29.9 (L) 08/27/2019   MCV 95.5 08/27/2019   PLT 236 08/27/2019     STUDIES: DG Chest 2 View  Result Date: 08/27/2019 CLINICAL DATA:  Shortness of breath EXAM: CHEST - 2 VIEW COMPARISON:  07/16/2019 FINDINGS: Cardiac shadow remains enlarged. Defibrillator is again seen. Stable vascular congestion is noted without significant interstitial edema. Small right-sided pleural effusion is noted. No focal infiltrate is seen. No bony abnormality is noted. IMPRESSION: Mild vascular congestion with small right-sided pleural effusion. No significant edema is noted. Electronically Signed   By: Inez Catalina M.D.   On: 08/27/2019 19:24   US Abdomen Limited  Result Date: 08/20/2019 CLINICAL DATA:  Ascites. EXAM: LIMITED ABDOMEN ULTRASOUND FOR ASCITES TECHNIQUE: Limited ultrasound survey for ascites was performed in all four abdominal quadrants. COMPARISON:  08/06/2019. FINDINGS: Only a small amount of ascites noted on today's exam. Paracentesis was not performed. No other abnormality identified. Repeat exam can be obtained as needed. IMPRESSION: Small amount of ascites noted on today's exam. Paracentesis not performed. Repeat exam can be obtained as. Electronically Signed   By: Marcello Moores  Register   On: 08/20/2019 13:05   US Paracentesis  Result Date: 08/06/2019 INDICATION: Ascites  EXAM: ULTRASOUND GUIDED PARACENTESIS MEDICATIONS: None. COMPLICATIONS: None immediate. PROCEDURE: Informed written consent was obtained from the patient after a discussion of the risks, benefits and alternatives to treatment. A timeout was performed prior to the initiation of the procedure. Initial ultrasound scanning demonstrates a large amount of ascites within the right lower abdominal quadrant. The right lower abdomen was prepped and draped in the usual sterile fashion. 1% lidocaine was used for local anesthesia. Following this, a 6 French catheter was introduced. An ultrasound  image was saved for documentation purposes. The paracentesis was performed. The catheter was removed and a dressing was applied. The patient tolerated the procedure well without immediate post procedural complication. Patient received post-procedure intravenous albumin; see nursing notes for details. FINDINGS: A total of approximately 1 L of serosanguineous fluid was removed. Samples were sent to the laboratory as requested by the clinical team. IMPRESSION: Successful ultrasound-guided paracentesis yielding 1 L of peritoneal fluid. Electronically Signed   By: Marcello Moores  Register   On: 08/06/2019 13:24   [place] ASCITES (ABDOMEN LIMITED)  Result Date: 08/05/2019 CLINICAL DATA:  Abdominal distension EXAM: LIMITED ABDOMEN ULTRASOUND FOR ASCITES TECHNIQUE: Limited ultrasound survey for ascites was performed in all four abdominal quadrants. COMPARISON:  None. FINDINGS: There is ascites, most notably in the right mid to lower abdomen. Peristalsing loops of bowel noted. IMPRESSION: Mild to moderate ascites with largest focus of ascites noted in the right mid to lower abdomen. Electronically Signed   By: Lowella Grip III M.D.   On: 08/05/2019 10:13    ASSESSMENT: Anemia, unspecified.  PLAN:    1.  Anemia, unspecified: May be related to chronic renal insufficiency.  Patient reports that she has a colonoscopy and EGD scheduled for next  week.  Hemoglobin has mildly improved to 9.6 today.  Iron stores, D78, folic acid, and hemolysis labs are all either negative or within normal limits.  Erythropoietin levels are pending at time of dictation.  Return to clinic in 2 weeks with repeat laboratory work and consideration of Retacrit. 2.  Acute renal failure: Patient's creatinine appears to be at her baseline. 3.  Ascites: Continue follow-up with heart failure clinic as scheduled. 4.  Painful urination: UA is negative.  Await culture results.  I spent a total of 45 minutes reviewing chart data, face-to-face evaluation with the patient, counseling and coordination of care as detailed above.   Patient expressed understanding and was in agreement with this plan. She also understands that She can call clinic at any time with any questions, concerns, or complaints.    Lloyd Huger, MD   08/27/2019 9:58 PM

## 2019-08-27 NOTE — ED Triage Notes (Signed)
Pt in via EMS from home with c/o SOB worsening over the last few days. Pt was seen by hematologist this am and was advised to call 911 if symptoms get worse.  108/68, [id number]

## 2019-08-27 NOTE — Progress Notes (Signed)
New patient here today for initial visit. Complains of a little pain when urinating and some urgency. State she may have a "bladder infection".

## 2019-08-27 NOTE — Telephone Encounter (Signed)
Patient called to say that she's gained ~ 5 pounds this week, has worsening shortness of breath and worsening pedal/abdominal swelling. She says that she's also notice urinary burning and is concerned that she may have a UTI.   She says that she's called her nephrology office twice and hasn't heard anything from them yet. She saw hematology earlier today and says that they drew lots of blood as well checked a urine specimen.   Will stop the furosemide and begin torsemide 56m daily. Advised patient that should her swelling/ shortness of breath worsen or her urinary symptoms worsen over the weekend that she should present to an Urgent Care or ED. Patient verbalized understanding.

## 2019-08-28 LAB — URINE CULTURE: Culture: NO GROWTH

## 2019-08-28 LAB — ERYTHROPOIETIN: Erythropoietin: 98.5 m[IU]/mL — ABNORMAL HIGH (ref 2.6–18.5)

## 2019-08-28 LAB — HAPTOGLOBIN: Haptoglobin: 173 mg/dL (ref 42–346)

## 2019-08-30 ENCOUNTER — Telehealth: Payer: Self-pay | Admitting: Emergency Medicine

## 2019-08-30 ENCOUNTER — Other Ambulatory Visit
Admission: RE | Admit: 2019-08-30 | Discharge: 2019-08-30 | Disposition: A | Discharge status: Home / Self Care | Payer: Medicare Other | Source: Ambulatory Visit | Attending: Internal Medicine | Admitting: Internal Medicine

## 2019-08-30 ENCOUNTER — Other Ambulatory Visit: Payer: Self-pay

## 2019-08-30 DIAGNOSIS — Z01812 Encounter for preprocedural laboratory examination: Secondary | ICD-10-CM | POA: Diagnosis present

## 2019-08-30 DIAGNOSIS — Z20822 Contact with and (suspected) exposure to covid-19: Secondary | ICD-10-CM | POA: Diagnosis not present

## 2019-08-30 LAB — SARS CORONAVIRUS 2 (TAT 6-24 HRS): SARS Coronavirus 2: NEGATIVE

## 2019-08-30 NOTE — Telephone Encounter (Signed)
Called patient due to lwot to inquire about condition and follow up plans. Left message.   

## 2019-09-01 ENCOUNTER — Ambulatory Visit: Payer: Medicare Other | Admitting: Anesthesiology

## 2019-09-01 ENCOUNTER — Other Ambulatory Visit: Payer: Self-pay

## 2019-09-01 ENCOUNTER — Encounter: Payer: Self-pay | Admitting: Internal Medicine

## 2019-09-01 ENCOUNTER — Encounter
Admission: RE | Disposition: A | Discharge status: Home / Self Care | Payer: Self-pay | Source: Home / Self Care | Attending: Internal Medicine

## 2019-09-01 ENCOUNTER — Ambulatory Visit
Admission: RE | Admit: 2019-09-01 | Discharge: 2019-09-01 | Disposition: A | Discharge status: Home / Self Care | Payer: Medicare Other | Source: Home / Self Care | Attending: Internal Medicine | Admitting: Internal Medicine

## 2019-09-01 DIAGNOSIS — Z79890 Hormone replacement therapy (postmenopausal): Secondary | ICD-10-CM | POA: Insufficient documentation

## 2019-09-01 DIAGNOSIS — D62 Acute posthemorrhagic anemia: Secondary | ICD-10-CM | POA: Insufficient documentation

## 2019-09-01 DIAGNOSIS — Z7982 Long term (current) use of aspirin: Secondary | ICD-10-CM | POA: Insufficient documentation

## 2019-09-01 DIAGNOSIS — I130 Hypertensive heart and chronic kidney disease with heart failure and stage 1 through stage 4 chronic kidney disease, or unspecified chronic kidney disease: Secondary | ICD-10-CM | POA: Insufficient documentation

## 2019-09-01 DIAGNOSIS — E1122 Type 2 diabetes mellitus with diabetic chronic kidney disease: Secondary | ICD-10-CM | POA: Insufficient documentation

## 2019-09-01 DIAGNOSIS — K31811 Angiodysplasia of stomach and duodenum with bleeding: Secondary | ICD-10-CM | POA: Diagnosis not present

## 2019-09-01 DIAGNOSIS — K922 Gastrointestinal hemorrhage, unspecified: Secondary | ICD-10-CM | POA: Diagnosis not present

## 2019-09-01 DIAGNOSIS — K228 Other specified diseases of esophagus: Secondary | ICD-10-CM | POA: Insufficient documentation

## 2019-09-01 DIAGNOSIS — Z9981 Dependence on supplemental oxygen: Secondary | ICD-10-CM | POA: Insufficient documentation

## 2019-09-01 DIAGNOSIS — F329 Major depressive disorder, single episode, unspecified: Secondary | ICD-10-CM | POA: Insufficient documentation

## 2019-09-01 DIAGNOSIS — Z86010 Personal history of colonic polyps: Secondary | ICD-10-CM | POA: Insufficient documentation

## 2019-09-01 DIAGNOSIS — Z7902 Long term (current) use of antithrombotics/antiplatelets: Secondary | ICD-10-CM | POA: Insufficient documentation

## 2019-09-01 DIAGNOSIS — K449 Diaphragmatic hernia without obstruction or gangrene: Secondary | ICD-10-CM | POA: Insufficient documentation

## 2019-09-01 DIAGNOSIS — I2510 Atherosclerotic heart disease of native coronary artery without angina pectoris: Secondary | ICD-10-CM | POA: Insufficient documentation

## 2019-09-01 DIAGNOSIS — I252 Old myocardial infarction: Secondary | ICD-10-CM | POA: Insufficient documentation

## 2019-09-01 DIAGNOSIS — K5730 Diverticulosis of large intestine without perforation or abscess without bleeding: Secondary | ICD-10-CM | POA: Insufficient documentation

## 2019-09-01 DIAGNOSIS — K766 Portal hypertension: Secondary | ICD-10-CM | POA: Insufficient documentation

## 2019-09-01 DIAGNOSIS — Z79899 Other long term (current) drug therapy: Secondary | ICD-10-CM | POA: Insufficient documentation

## 2019-09-01 DIAGNOSIS — J449 Chronic obstructive pulmonary disease, unspecified: Secondary | ICD-10-CM | POA: Insufficient documentation

## 2019-09-01 DIAGNOSIS — Z794 Long term (current) use of insulin: Secondary | ICD-10-CM | POA: Insufficient documentation

## 2019-09-01 DIAGNOSIS — D124 Benign neoplasm of descending colon: Secondary | ICD-10-CM | POA: Insufficient documentation

## 2019-09-01 DIAGNOSIS — N189 Chronic kidney disease, unspecified: Secondary | ICD-10-CM | POA: Insufficient documentation

## 2019-09-01 DIAGNOSIS — R1310 Dysphagia, unspecified: Secondary | ICD-10-CM | POA: Insufficient documentation

## 2019-09-01 DIAGNOSIS — Z950 Presence of cardiac pacemaker: Secondary | ICD-10-CM | POA: Insufficient documentation

## 2019-09-01 DIAGNOSIS — I5042 Chronic combined systolic (congestive) and diastolic (congestive) heart failure: Secondary | ICD-10-CM | POA: Insufficient documentation

## 2019-09-01 DIAGNOSIS — K641 Second degree hemorrhoids: Secondary | ICD-10-CM | POA: Insufficient documentation

## 2019-09-01 DIAGNOSIS — E079 Disorder of thyroid, unspecified: Secondary | ICD-10-CM | POA: Insufficient documentation

## 2019-09-01 DIAGNOSIS — F419 Anxiety disorder, unspecified: Secondary | ICD-10-CM | POA: Insufficient documentation

## 2019-09-01 DIAGNOSIS — D123 Benign neoplasm of transverse colon: Secondary | ICD-10-CM | POA: Insufficient documentation

## 2019-09-01 DIAGNOSIS — K7460 Unspecified cirrhosis of liver: Secondary | ICD-10-CM | POA: Insufficient documentation

## 2019-09-01 DIAGNOSIS — K2970 Gastritis, unspecified, without bleeding: Secondary | ICD-10-CM | POA: Insufficient documentation

## 2019-09-01 HISTORY — PX: COLONOSCOPY WITH PROPOFOL: SHX5780

## 2019-09-01 HISTORY — PX: ESOPHAGOGASTRODUODENOSCOPY (EGD) WITH PROPOFOL: SHX5813

## 2019-09-01 LAB — KOH PREP

## 2019-09-01 LAB — GLUCOSE, CAPILLARY
Glucose-Capillary: 59 mg/dL — ABNORMAL LOW (ref 70–99)
Glucose-Capillary: 85 mg/dL (ref 70–99)

## 2019-09-01 SURGERY — COLONOSCOPY WITH PROPOFOL
Anesthesia: General

## 2019-09-01 MED ORDER — SODIUM CHLORIDE 0.9 % IV SOLN: INTRAVENOUS | Status: DC

## 2019-09-01 MED ORDER — EPHEDRINE 5 MG/ML INJ
INTRAVENOUS | Status: AC
  Filled 2019-09-01: qty 10

## 2019-09-01 MED ORDER — PROPOFOL 500 MG/50ML IV EMUL
INTRAVENOUS | Status: AC
  Filled 2019-09-01: qty 50

## 2019-09-01 MED ORDER — PROPOFOL 500 MG/50ML IV EMUL
INTRAVENOUS | Status: DC | PRN
  Administered 2019-09-01: 100 ug/kg/min via INTRAVENOUS

## 2019-09-01 MED ORDER — LACTATED RINGERS IV SOLN: INTRAVENOUS | Status: DC

## 2019-09-01 MED ORDER — EPHEDRINE SULFATE 50 MG/ML IJ SOLN
INTRAMUSCULAR | Status: DC | PRN
  Administered 2019-09-01 (×2): 5 mg via INTRAVENOUS

## 2019-09-01 NOTE — Anesthesia Preprocedure Evaluation (Signed)
Anesthesia Evaluation  Patient identified by MRN, date of birth, ID band Patient awake    Reviewed: Allergy & Precautions, NPO status , Patient's Chart, lab work & pertinent test results  History of Anesthesia Complications Negative for: history of anesthetic complications  Airway Mallampati: II  TM Distance: >3 FB Neck ROM: Full    Dental no notable dental hx. (+) Edentulous Upper, Edentulous Lower   Pulmonary neg sleep apnea, COPD,  oxygen dependent, Current Smoker and Patient abstained from smoking.,     + decreased breath sounds      Cardiovascular Exercise Tolerance: Good METShypertension, + CAD, + Past MI, + Cardiac Stents, +CHF and + DOE  (-) dysrhythmias + pacemaker + Cardiac Defibrillator  Rhythm:Regular Rate:Normal - Systolic murmurs 5 Mis with cardiac arrest before, s/p ICD with multiple discharges in past for ventricular arrhythmias. EF 35% on recent echo. Per cardiologist notes, no further invasive intervention currently warranted.    Neuro/Psych PSYCHIATRIC DISORDERS Anxiety Depression negative neurological ROS     GI/Hepatic neg GERD  ,(+)     (-) substance abuse  ,   Endo/Other  diabetesHypothyroidism   Renal/GU CRFRenal disease     Musculoskeletal   Abdominal (+) + obese,  Abdomen: soft.    Peds  Hematology  (+) anemia ,   Anesthesia Other Findings Past Medical History: No date: Anxiety No date: Chronic combined systolic and diastolic CHF (congestive  heart failure) ([facility]) No date: Chronic kidney disease No date: Coronary artery disease No date: Depression No date: Diabetes mellitus without complication ([facility]) No date: Diabetes mellitus, type II ([facility]) No date: Hypertension No date: MI (myocardial infarction) ([place])     Comment:  x 5 No date: Pacemaker No date: Prolonged Q-T interval on ECG No date: Thyroid disease  Reproductive/Obstetrics                              Anesthesia Physical Anesthesia Plan  ASA: IV  Anesthesia Plan: General   Post-op Pain Management:    Induction: Intravenous  PONV Risk Score and Plan: 3 and Ondansetron, Propofol infusion and TIVA  Airway Management Planned: Nasal Cannula  Additional Equipment: None  Intra-op Plan:   Post-operative Plan:   Informed Consent: I have reviewed the patients History and Physical, chart, labs and discussed the procedure including the risks, benefits and alternatives for the proposed anesthesia with the patient or authorized representative who has indicated his/her understanding and acceptance.     Dental advisory given  Plan Discussed with: CRNA and Surgeon  Anesthesia Plan Comments: (Discussed risks of anesthesia with patient, including possibility of difficulty with spontaneous ventilation under anesthesia necessitating airway intervention, PONV, and rare risks such as cardiac or respiratory or neurological events. Patient understands. Patient counseled on being higher risk for anesthesia due to comorbidities: CHF, oxygen dependence. Patient was told about increased risk of cardiac and respiratory events, including death. Patient understands. )        Anesthesia Quick Evaluation

## 2019-09-01 NOTE — Anesthesia Postprocedure Evaluation (Signed)
Anesthesia Post Note  Patient: Heather Peterson  Procedure(s) Performed: COLONOSCOPY WITH PROPOFOL (N/A ) ESOPHAGOGASTRODUODENOSCOPY (EGD) WITH PROPOFOL (N/A )  Patient location during evaluation: Endoscopy Anesthesia Type: General Level of consciousness: awake and alert Pain management: pain level controlled Vital Signs Assessment: post-procedure vital signs reviewed and stable Respiratory status: spontaneous breathing, nonlabored ventilation, respiratory function stable and patient connected to nasal cannula oxygen Cardiovascular status: blood pressure returned to baseline and stable Postop Assessment: no apparent nausea or vomiting Anesthetic complications: no     Last Vitals:  Vitals:   09/01/19 0959 09/01/19 1009  BP: (!) 100/58 (!) 93/58  Pulse: 70 69  Resp: (!) 6 (!) 8  Temp: (!) 36.1 C   SpO2: 100% 100%    Last Pain:  Vitals:   09/01/19 1009  TempSrc:   PainSc: Asleep                 Arita Miss

## 2019-09-01 NOTE — Transfer of Care (Signed)
Immediate Anesthesia Transfer of Care Note  Patient: Heather Peterson  Procedure(s) Performed: COLONOSCOPY WITH PROPOFOL (N/A ) ESOPHAGOGASTRODUODENOSCOPY (EGD) WITH PROPOFOL (N/A )  Patient Location: PACU  Anesthesia Type:General  Level of Consciousness: awake and sedated  Airway & Oxygen Therapy: Patient Spontanous Breathing and Patient connected to face mask oxygen  Post-op Assessment: Report given to RN and Post -op Vital signs reviewed and stable  Post vital signs: Reviewed and stable  Last Vitals:  Vitals Value Taken Time  BP 100/58 09/01/19 0959  Temp 36.1 C 09/01/19 0959  Pulse 70 09/01/19 0959  Resp 6 09/01/19 0959  SpO2 100 % 09/01/19 0959    Last Pain:  Vitals:   09/01/19 0959  TempSrc: Temporal  PainSc:          Complications: No apparent anesthesia complications

## 2019-09-01 NOTE — Op Note (Signed)
[facility] Gastroenterology Patient Name: Heather Peterson Procedure Date: 09/01/2019 8:42 AM MRN: [id number] Account #: [id number] Date of Birth: 10-10-1940 Admit Type: Outpatient Age: 79 Room: [facility] ENDO ROOM 3 Gender: Female Note Status: Finalized Procedure:             Upper GI endoscopy Indications:           Dysphagia, Gastro-esophageal reflux disease, Failure                         to respond to medical treatment Providers:             Benay Pike. Jiayi Lengacher MD, MD Medicines:             Propofol per Anesthesia Complications:         No immediate complications. Procedure:             Pre-Anesthesia Assessment:                        - The risks and benefits of the procedure and the                         sedation options and risks were discussed with the                         patient. All questions were answered and informed                         consent was obtained.                        - Patient identification and proposed procedure were                         verified prior to the procedure by the nurse. The                         procedure was verified in the procedure room.                        - ASA Grade Assessment: III - A patient with severe                         systemic disease.                        - After reviewing the risks and benefits, the patient                         was deemed in satisfactory condition to undergo the                         procedure.                        After obtaining informed consent, the endoscope was                         passed under direct vision. Throughout the procedure,  the patient's blood pressure, pulse, and oxygen                         saturations were monitored continuously. The Endoscope                         was introduced through the mouth, and advanced to the                         third part of duodenum. The upper GI endoscopy was                          accomplished without difficulty. The patient tolerated                         the procedure well. Findings:      Diffuse, white plaques were found in the middle third of the esophagus.       Cells for cytology were obtained by brushing.      Mucosal changes including feline appearance and longitudinal furrows       were found in the entire esophagus. Biopsies were obtained from the       proximal and distal esophagus with cold forceps for histology of       suspected eosinophilic esophagitis.      The Z-line was irregular and was found at the gastroesophageal junction.       Mucosa was biopsied with a cold forceps for histology. One specimen       bottle was sent to pathology.      Diffuse moderate inflammation characterized by congestion (edema) and       erythema was found in the entire examined stomach.      A 1 cm hiatal hernia was present.      The examined duodenum was normal.      There is no endoscopic evidence of stenosis, stricture or varices in the       distal esophagus.      There is no endoscopic evidence of varices in the stomach. Impression:            - Esophageal plaques were found, suspicious for                         candidiasis. Cells for cytology obtained.                        - Esophageal mucosal changes suggestive of                         eosinophilic esophagitis. Biopsied.                        - Z-line irregular, at the gastroesophageal junction.                         Biopsied.                        - Gastritis.                        - 1 cm hiatal hernia.                        -  Normal examined duodenum.                        - Moderate portal hypertensive gastropathy Recommendation:        - Await pathology results.                        - Proceed with colonoscopy Procedure Code(s):     --- Professional ---                        [phone], Esophagogastroduodenoscopy, flexible,                         transoral; with biopsy, single or  multiple Diagnosis Code(s):     --- Professional ---                        K21.9, Gastro-esophageal reflux disease without                         esophagitis                        R13.10, Dysphagia, unspecified                        K44.9, Diaphragmatic hernia without obstruction or                         gangrene                        K29.70, Gastritis, unspecified, without bleeding                        K22.8, Other specified diseases of esophagus                        K22.9, Disease of esophagus, unspecified CPT copyright 2019 American Medical Association. All rights reserved. The codes documented in this report are preliminary and upon coder review may  be revised to meet current compliance requirements. Efrain Sella MD, MD 09/01/2019 9:33:58 AM This report has been signed electronically. Number of Addenda: 0 Note Initiated On: 09/01/2019 8:42 AM Estimated Blood Loss:  Estimated blood loss: none.      [facility]

## 2019-09-01 NOTE — Interval H&P Note (Signed)
History and Physical Interval Note:  09/01/2019 8:42 AM  Heather Peterson  has presented today for surgery, with the diagnosis of ANEMIA HEME + STOOLS.  The various methods of treatment have been discussed with the patient and family. After consideration of risks, benefits and other options for treatment, the patient has consented to  Procedure(s): COLONOSCOPY WITH PROPOFOL (N/A) ESOPHAGOGASTRODUODENOSCOPY (EGD) WITH PROPOFOL (N/A) as a surgical intervention.  The patient's history has been reviewed, patient examined, no change in status, stable for surgery.  I have reviewed the patient's chart and labs.  Questions were answered to the patient's satisfaction.     [place], [place]

## 2019-09-01 NOTE — H&P (Signed)
Outpatient short stay form Pre-procedure 09/01/2019 8:41 AM Kida Digiulio K. Alice Reichert, M.D.  Primary Physician: [name], M.D.  Reason for visit: Anemia secondary to blood loss, Dysphagia, hx of cirrhosis, hx of colon polyps.  History of present illness:  As above. Patient denies change in bowel habits, rectal bleeding, weight loss or abdominal pain.  Patient denies intractable heartburn,hemetemesis, abdominal pain, nausea or vomiting.    No current facility-administered medications for this encounter.  Medications Prior to Admission  Medication Sig Dispense Refill Last Dose  . acetaminophen (TYLENOL) 500 MG tablet Take 500 mg by mouth every 4 (four) hours as needed for mild pain, fever or headache.    Past Month at Unknown time  . albuterol (PROVENTIL HFA;VENTOLIN HFA) 108 (90 Base) MCG/ACT inhaler Inhale 2 puffs into the lungs every 4 (four) hours as needed for wheezing or shortness of breath. 1 Inhaler 1 Past Month at Unknown time  . aspirin EC 81 MG tablet Take 81 mg by mouth daily.   08/31/2019 at Unknown time  . fexofenadine (ALLEGRA) 180 MG tablet Take 180 mg by mouth daily.   09/01/2019 at Unknown time  . FLUoxetine (PROZAC) 10 MG capsule Take 10 mg by mouth daily.   08/31/2019 at Unknown time  . levothyroxine (SYNTHROID) 50 MCG tablet Take 50 mcg by mouth See admin instructions. Take 1 tablet (47mg) by mouth every Tuesday, Thursday, Saturday and Sunday before breakfast   09/01/2019 at Unknown time  . levothyroxine (SYNTHROID, LEVOTHROID) 75 MCG tablet Take 75 mcg by mouth See admin instructions. Take 1 tablet (793m) by mouth every Monday, Wednesday and Friday morning before breakfast   09/01/2019 at Unknown time  . magnesium oxide (MAG-OX) 400 MG tablet Take 200 mg by mouth daily.   08/31/2019 at Unknown time  . mexiletine (MEXITIL) 200 MG capsule Take 1 capsule (200 mg total) by mouth every 12 (twelve) hours. 60 capsule 1 09/01/2019 at Unknown time  . OXYGEN Inhale 2 L into the lungs  continuous.   09/01/2019 at Unknown time  . ranolazine (RANEXA) 500 MG 12 hr tablet Take 1 tablet (500 mg total) by mouth 2 (two) times daily. 60 tablet 1 09/01/2019 at Unknown time  . simvastatin (ZOCOR) 40 MG tablet Take 20 mg by mouth at bedtime.   2 08/31/2019 at Unknown time  . torsemide (DEMADEX) 20 MG tablet Take 2 tablets (40 mg total) by mouth daily. 60 tablet 5 08/31/2019 at Unknown time  . traZODone (DESYREL) 50 MG tablet Take 50 mg by mouth at bedtime.   08/31/2019 at Unknown time  . triamcinolone (NASACORT) 55 MCG/ACT AERO nasal inhaler Place 2 sprays into the nose 2 (two) times daily. 1 Inhaler 0 09/01/2019 at Unknown time  . clopidogrel (PLAVIX) 75 MG tablet Take 75 mg by mouth daily.   08/25/2019  . ferrous sulfate 324 MG TBEC Take 324 mg by mouth daily.   08/25/2019  . LANTUS SOLOSTAR 100 UNIT/ML Solostar Pen Inject 16 Units into the skin at bedtime.     . midodrine (PROAMATINE) 5 MG tablet Take 5 mg by mouth as needed.   Not Taking at Unknown time  . pantoprazole (PROTONIX) 40 MG tablet Take 40 mg by mouth daily.   08/30/2019  . Prenatal Vit-Fe Fumarate-FA (PRENATAL MULTIVITAMIN) TABS tablet Take 1 tablet by mouth daily.   08/25/2019  . sodium chloride (OCEAN) 0.65 % SOLN nasal spray Place 1 spray into both nostrils as needed for congestion. 15 mL 0  Allergies  Allergen Reactions  . Celebrex [Celecoxib] Anaphylaxis  . Glipizide Anaphylaxis  . Lisinopril Swelling    Lip and facial swelling  . Sulfa Antibiotics Other (See Comments) and Anaphylaxis    Reaction: unknown  . Levaquin [Levofloxacin In D5w] Other (See Comments)    Heart arrhthymias  . Levofloxacin Other (See Comments)    ICD fired  . Metformin Other (See Comments)    Gi tolerance   . Penicillins Rash and Other (See Comments)    Has patient had a PCN reaction causing immediate rash, facial/tongue/throat swelling, SOB or lightheadedness with hypotension: Unknown Has patient had a PCN reaction causing severe rash  involving mucus membranes or skin necrosis: No Has patient had a PCN reaction that required hospitalization: No Has patient had a PCN reaction occurring within the last 10 years: No If all of the above answers are "NO", then may proceed with Cephalosporin use.      Past Medical History:  Diagnosis Date  . Anxiety   . Chronic combined systolic and diastolic CHF (congestive heart failure) ([place])   . Chronic kidney disease   . Coronary artery disease   . Depression   . Diabetes mellitus without complication ([place])   . Diabetes mellitus, type II ([place])   . Hypertension   . MI (myocardial infarction) ([place])    x 5  . Pacemaker   . Prolonged Q-T interval on ECG   . Thyroid disease     Review of systems:  Otherwise negative.    Physical Exam  Gen: Alert, oriented. Appears stated age.  HEENT: [place]/AT. PERRLA. Lungs: CTA, no wheezes. CV: RR nl S1, S2. Abd: soft, benign, no masses. BS+ Ext: No edema. Pulses 2+    Planned procedures: Proceed with EGD and colonoscopy. The patient understands the nature of the planned procedure, indications, risks, alternatives and potential complications including but not limited to bleeding, infection, perforation, damage to internal organs and possible oversedation/side effects from anesthesia. The patient agrees and gives consent to proceed.  Please refer to procedure notes for findings, recommendations and patient disposition/instructions.     Hadlee Burback K. Alice Reichert, M.D. Gastroenterology 09/01/2019  8:41 AM

## 2019-09-01 NOTE — Op Note (Signed)
[facility] Gastroenterology Patient Name: Heather Peterson Procedure Date: 09/01/2019 8:42 AM MRN: [id number] Account #: [id number] Date of Birth: 1940/10/10 Admit Type: Outpatient Age: 79 Room: [facility] ENDO ROOM 3 Gender: Female Note Status: Finalized Procedure:             Colonoscopy Indications:           Heme positive stool, Unexplained iron deficiency anemia Providers:             Benay Pike. Jake Goodson MD, MD Medicines:             Propofol per Anesthesia Complications:         No immediate complications. Procedure:             Pre-Anesthesia Assessment:                        - The risks and benefits of the procedure and the                         sedation options and risks were discussed with the                         patient. All questions were answered and informed                         consent was obtained.                        - Patient identification and proposed procedure were                         verified prior to the procedure by the nurse. The                         procedure was verified in the procedure room.                        - ASA Grade Assessment: IV - A patient with severe                         systemic disease that is a constant threat to life.                        - After reviewing the risks and benefits, the patient                         was deemed in satisfactory condition to undergo the                         procedure.                        After obtaining informed consent, the colonoscope was                         passed under direct vision. Throughout the procedure,                         the patient's blood pressure, pulse, and oxygen  saturations were monitored continuously. The                         Colonoscope was introduced through the anus and                         advanced to the the cecum, identified by appendiceal                         orifice and ileocecal valve. The  colonoscopy was                         technically difficult and complex due to multiple                         diverticula in the colon. Successful completion of the                         procedure was aided by straightening and shortening                         the scope to obtain bowel loop reduction. The patient                         tolerated the procedure well. The quality of the bowel                         preparation was adequate. The ileocecal valve,                         appendiceal orifice, and rectum were photographed. Findings:      The perianal and digital rectal examinations were normal. Pertinent       negatives include normal sphincter tone and no palpable rectal lesions.      Multiple small and large-mouthed diverticula were found in the sigmoid       colon. There was no evidence of diverticular bleeding.      Two sessile polyps were found in the descending colon and transverse       colon. The polyps were 4 to 5 mm in size. These polyps were removed with       a jumbo cold forceps. Resection and retrieval were complete.      A 7 mm polyp was found in the transverse colon. The polyp was sessile.       The polyp was removed with a cold snare. Resection and retrieval were       complete.      Non-bleeding internal hemorrhoids were found during retroflexion. The       hemorrhoids were Grade II (internal hemorrhoids that prolapse but reduce       spontaneously).      The exam was otherwise without abnormality. Impression:            - Severe diverticulosis in the sigmoid colon. There                         was no evidence of diverticular bleeding.                        - Two 4 to  5 mm polyps in the descending colon and in                         the transverse colon, removed with a jumbo cold                         forceps. Resected and retrieved.                        - One 7 mm polyp in the transverse colon, removed with                         a cold  snare. Resected and retrieved.                        - Non-bleeding internal hemorrhoids.                        - The examination was otherwise normal. Recommendation:        - Await pathology results from EGD, also performed                         today.                        - Patient has a contact number available for                         emergencies. The signs and symptoms of potential                         delayed complications were discussed with the patient.                         Return to normal activities tomorrow. Written                         discharge instructions were provided to the patient.                        - Resume previous diet.                        - Continue present medications.                        - Await pathology results.                        - If polyps are benign or adenomatous without                         dysplasia, I will advise NO further colonoscopy due to                         advanced age and/or severe comorbidity.                        - Return to physician assistant in 6 weeks.                        -  The findings and recommendations were discussed with                         the patient. Procedure Code(s):     --- Professional ---                        [id number], Colonoscopy, flexible; with removal of                         tumor(s), polyp(s), or other lesion(s) by snare                         technique                        45380, 46, Colonoscopy, flexible; with biopsy, single                         or multiple Diagnosis Code(s):     --- Professional ---                        K57.30, Diverticulosis of large intestine without                         perforation or abscess without bleeding                        D50.9, Iron deficiency anemia, unspecified                        R19.5, Other fecal abnormalities                        K63.5, Polyp of colon                        K64.1, Second degree hemorrhoids CPT  copyright 2019 [employer]. All rights reserved. The codes documented in this report are preliminary and upon coder review may  be revised to meet current compliance requirements. Efrain Sella MD, MD 09/01/2019 10:04:30 AM This report has been signed electronically. Number of Addenda: 0 Note Initiated On: 09/01/2019 8:42 AM Scope Withdrawal Time: 0 hours 10 minutes 38 seconds  Total Procedure Duration: 0 hours 21 minutes 17 seconds  Estimated Blood Loss:  Estimated blood loss was minimal.      [facility]

## 2019-09-02 LAB — SURGICAL PATHOLOGY

## 2019-09-04 ENCOUNTER — Other Ambulatory Visit: Payer: Self-pay

## 2019-09-04 ENCOUNTER — Inpatient Hospital Stay
Admission: EM | Admit: 2019-09-04 | Discharge: 2019-09-09 | DRG: 378 | Disposition: A | Discharge status: Home Health | Payer: Medicare Other | Attending: Internal Medicine | Admitting: Internal Medicine

## 2019-09-04 DIAGNOSIS — F17210 Nicotine dependence, cigarettes, uncomplicated: Secondary | ICD-10-CM | POA: Diagnosis present

## 2019-09-04 DIAGNOSIS — I2510 Atherosclerotic heart disease of native coronary artery without angina pectoris: Secondary | ICD-10-CM | POA: Diagnosis present

## 2019-09-04 DIAGNOSIS — F329 Major depressive disorder, single episode, unspecified: Secondary | ICD-10-CM | POA: Diagnosis present

## 2019-09-04 DIAGNOSIS — Z955 Presence of coronary angioplasty implant and graft: Secondary | ICD-10-CM | POA: Diagnosis not present

## 2019-09-04 DIAGNOSIS — I252 Old myocardial infarction: Secondary | ICD-10-CM

## 2019-09-04 DIAGNOSIS — N1832 Chronic kidney disease, stage 3b: Secondary | ICD-10-CM | POA: Diagnosis present

## 2019-09-04 DIAGNOSIS — Z79890 Hormone replacement therapy (postmenopausal): Secondary | ICD-10-CM

## 2019-09-04 DIAGNOSIS — N184 Chronic kidney disease, stage 4 (severe): Secondary | ICD-10-CM

## 2019-09-04 DIAGNOSIS — K2970 Gastritis, unspecified, without bleeding: Secondary | ICD-10-CM | POA: Diagnosis present

## 2019-09-04 DIAGNOSIS — Z794 Long term (current) use of insulin: Secondary | ICD-10-CM | POA: Diagnosis not present

## 2019-09-04 DIAGNOSIS — Z888 Allergy status to other drugs, medicaments and biological substances status: Secondary | ICD-10-CM | POA: Diagnosis not present

## 2019-09-04 DIAGNOSIS — K766 Portal hypertension: Secondary | ICD-10-CM | POA: Diagnosis present

## 2019-09-04 DIAGNOSIS — E1122 Type 2 diabetes mellitus with diabetic chronic kidney disease: Secondary | ICD-10-CM

## 2019-09-04 DIAGNOSIS — R188 Other ascites: Secondary | ICD-10-CM | POA: Diagnosis present

## 2019-09-04 DIAGNOSIS — B3781 Candidal esophagitis: Secondary | ICD-10-CM | POA: Diagnosis present

## 2019-09-04 DIAGNOSIS — D62 Acute posthemorrhagic anemia: Secondary | ICD-10-CM

## 2019-09-04 DIAGNOSIS — Z6832 Body mass index (BMI) 32.0-32.9, adult: Secondary | ICD-10-CM

## 2019-09-04 DIAGNOSIS — K31811 Angiodysplasia of stomach and duodenum with bleeding: Secondary | ICD-10-CM | POA: Diagnosis present

## 2019-09-04 DIAGNOSIS — Z8249 Family history of ischemic heart disease and other diseases of the circulatory system: Secondary | ICD-10-CM

## 2019-09-04 DIAGNOSIS — K200 Eosinophilic esophagitis: Secondary | ICD-10-CM | POA: Diagnosis present

## 2019-09-04 DIAGNOSIS — Z95810 Presence of automatic (implantable) cardiac defibrillator: Secondary | ICD-10-CM

## 2019-09-04 DIAGNOSIS — E611 Iron deficiency: Secondary | ICD-10-CM | POA: Diagnosis present

## 2019-09-04 DIAGNOSIS — Z818 Family history of other mental and behavioral disorders: Secondary | ICD-10-CM

## 2019-09-04 DIAGNOSIS — J9611 Chronic respiratory failure with hypoxia: Secondary | ICD-10-CM | POA: Diagnosis not present

## 2019-09-04 DIAGNOSIS — E039 Hypothyroidism, unspecified: Secondary | ICD-10-CM | POA: Diagnosis present

## 2019-09-04 DIAGNOSIS — J9610 Chronic respiratory failure, unspecified whether with hypoxia or hypercapnia: Secondary | ICD-10-CM | POA: Diagnosis present

## 2019-09-04 DIAGNOSIS — Z880 Allergy status to penicillin: Secondary | ICD-10-CM | POA: Diagnosis not present

## 2019-09-04 DIAGNOSIS — J449 Chronic obstructive pulmonary disease, unspecified: Secondary | ICD-10-CM | POA: Diagnosis present

## 2019-09-04 DIAGNOSIS — Z7902 Long term (current) use of antithrombotics/antiplatelets: Secondary | ICD-10-CM

## 2019-09-04 DIAGNOSIS — K920 Hematemesis: Secondary | ICD-10-CM | POA: Diagnosis present

## 2019-09-04 DIAGNOSIS — D631 Anemia in chronic kidney disease: Secondary | ICD-10-CM | POA: Diagnosis present

## 2019-09-04 DIAGNOSIS — I5022 Chronic systolic (congestive) heart failure: Secondary | ICD-10-CM | POA: Diagnosis present

## 2019-09-04 DIAGNOSIS — Z882 Allergy status to sulfonamides status: Secondary | ICD-10-CM | POA: Diagnosis not present

## 2019-09-04 DIAGNOSIS — F419 Anxiety disorder, unspecified: Secondary | ICD-10-CM | POA: Diagnosis present

## 2019-09-04 DIAGNOSIS — K7460 Unspecified cirrhosis of liver: Secondary | ICD-10-CM | POA: Diagnosis present

## 2019-09-04 DIAGNOSIS — E119 Type 2 diabetes mellitus without complications: Secondary | ICD-10-CM

## 2019-09-04 DIAGNOSIS — E785 Hyperlipidemia, unspecified: Secondary | ICD-10-CM | POA: Diagnosis present

## 2019-09-04 DIAGNOSIS — K449 Diaphragmatic hernia without obstruction or gangrene: Secondary | ICD-10-CM | POA: Diagnosis present

## 2019-09-04 DIAGNOSIS — N183 Chronic kidney disease, stage 3 unspecified: Secondary | ICD-10-CM

## 2019-09-04 DIAGNOSIS — Z7982 Long term (current) use of aspirin: Secondary | ICD-10-CM

## 2019-09-04 DIAGNOSIS — Z20822 Contact with and (suspected) exposure to covid-19: Secondary | ICD-10-CM | POA: Diagnosis present

## 2019-09-04 DIAGNOSIS — Z79899 Other long term (current) drug therapy: Secondary | ICD-10-CM

## 2019-09-04 DIAGNOSIS — I959 Hypotension, unspecified: Secondary | ICD-10-CM | POA: Diagnosis present

## 2019-09-04 DIAGNOSIS — I5042 Chronic combined systolic (congestive) and diastolic (congestive) heart failure: Secondary | ICD-10-CM | POA: Diagnosis present

## 2019-09-04 DIAGNOSIS — I10 Essential (primary) hypertension: Secondary | ICD-10-CM | POA: Diagnosis present

## 2019-09-04 DIAGNOSIS — T501X5A Adverse effect of loop [high-ceiling] diuretics, initial encounter: Secondary | ICD-10-CM | POA: Diagnosis not present

## 2019-09-04 DIAGNOSIS — K922 Gastrointestinal hemorrhage, unspecified: Secondary | ICD-10-CM | POA: Diagnosis present

## 2019-09-04 DIAGNOSIS — E1165 Type 2 diabetes mellitus with hyperglycemia: Secondary | ICD-10-CM | POA: Diagnosis present

## 2019-09-04 DIAGNOSIS — K31819 Angiodysplasia of stomach and duodenum without bleeding: Secondary | ICD-10-CM | POA: Diagnosis not present

## 2019-09-04 DIAGNOSIS — E876 Hypokalemia: Secondary | ICD-10-CM | POA: Diagnosis not present

## 2019-09-04 DIAGNOSIS — K5730 Diverticulosis of large intestine without perforation or abscess without bleeding: Secondary | ICD-10-CM | POA: Diagnosis present

## 2019-09-04 DIAGNOSIS — Z8719 Personal history of other diseases of the digestive system: Secondary | ICD-10-CM

## 2019-09-04 DIAGNOSIS — E669 Obesity, unspecified: Secondary | ICD-10-CM | POA: Diagnosis present

## 2019-09-04 DIAGNOSIS — R1310 Dysphagia, unspecified: Secondary | ICD-10-CM | POA: Diagnosis present

## 2019-09-04 DIAGNOSIS — I130 Hypertensive heart and chronic kidney disease with heart failure and stage 1 through stage 4 chronic kidney disease, or unspecified chronic kidney disease: Secondary | ICD-10-CM | POA: Diagnosis present

## 2019-09-04 DIAGNOSIS — K3189 Other diseases of stomach and duodenum: Secondary | ICD-10-CM | POA: Diagnosis present

## 2019-09-04 DIAGNOSIS — G2581 Restless legs syndrome: Secondary | ICD-10-CM | POA: Diagnosis not present

## 2019-09-04 LAB — URINALYSIS, COMPLETE (UACMP) WITH MICROSCOPIC
Bilirubin Urine: NEGATIVE
Glucose, UA: NEGATIVE mg/dL
Ketones, ur: NEGATIVE mg/dL
Nitrite: NEGATIVE
Protein, ur: 30 mg/dL — AB
Specific Gravity, Urine: 1.015 (ref 1.005–1.030)
pH: 7.5 (ref 5.0–8.0)

## 2019-09-04 LAB — PROTIME-INR
INR: 1.4 — ABNORMAL HIGH (ref 0.8–1.2)
Prothrombin Time: 17 seconds — ABNORMAL HIGH (ref 11.4–15.2)

## 2019-09-04 LAB — CBC WITH DIFFERENTIAL/PLATELET
Abs Immature Granulocytes: 0.1 10*3/uL — ABNORMAL HIGH (ref 0.00–0.07)
Basophils Absolute: 0 10*3/uL (ref 0.0–0.1)
Basophils Relative: 1 %
Eosinophils Absolute: 0.1 10*3/uL (ref 0.0–0.5)
Eosinophils Relative: 1 %
HCT: 20.5 % — ABNORMAL LOW (ref 36.0–46.0)
Hemoglobin: 6.6 g/dL — ABNORMAL LOW (ref 12.0–15.0)
Immature Granulocytes: 1 %
Lymphocytes Relative: 23 %
Lymphs Abs: 2 10*3/uL (ref 0.7–4.0)
MCH: 30.8 pg (ref 26.0–34.0)
MCHC: 32.2 g/dL (ref 30.0–36.0)
MCV: 95.8 fL (ref 80.0–100.0)
Monocytes Absolute: 1 10*3/uL (ref 0.1–1.0)
Monocytes Relative: 11 %
Neutro Abs: 5.4 10*3/uL (ref 1.7–7.7)
Neutrophils Relative %: 63 %
Platelets: 199 10*3/uL (ref 150–400)
RBC: 2.14 MIL/uL — ABNORMAL LOW (ref 3.87–5.11)
RDW: 21.2 % — ABNORMAL HIGH (ref 11.5–15.5)
Smear Review: NORMAL
WBC: 8.7 10*3/uL (ref 4.0–10.5)
nRBC: 0.7 % — ABNORMAL HIGH (ref 0.0–0.2)

## 2019-09-04 LAB — COMPREHENSIVE METABOLIC PANEL
ALT: 10 U/L (ref 0–44)
AST: 19 U/L (ref 15–41)
Albumin: 3.1 g/dL — ABNORMAL LOW (ref 3.5–5.0)
Alkaline Phosphatase: 67 U/L (ref 38–126)
Anion gap: 9 (ref 5–15)
BUN: 37 mg/dL — ABNORMAL HIGH (ref 8–23)
CO2: 30 mmol/L (ref 22–32)
Calcium: 8.4 mg/dL — ABNORMAL LOW (ref 8.9–10.3)
Chloride: 97 mmol/L — ABNORMAL LOW (ref 98–111)
Creatinine, Ser: 2.5 mg/dL — ABNORMAL HIGH (ref 0.44–1.00)
GFR calc Af Amer: 21 mL/min — ABNORMAL LOW (ref >60)
GFR calc non Af Amer: 18 mL/min — ABNORMAL LOW (ref >60)
Glucose, Bld: 106 mg/dL — ABNORMAL HIGH (ref 70–99)
Potassium: 4.3 mmol/L (ref 3.5–5.1)
Sodium: 136 mmol/L (ref 135–145)
Total Bilirubin: 1 mg/dL (ref 0.3–1.2)
Total Protein: 6.6 g/dL (ref 6.5–8.1)

## 2019-09-04 LAB — TROPONIN I (HIGH SENSITIVITY): Troponin I (High Sensitivity): 14 ng/L (ref <18)

## 2019-09-04 LAB — BRAIN NATRIURETIC PEPTIDE: B Natriuretic Peptide: 440 pg/mL — ABNORMAL HIGH (ref 0.0–100.0)

## 2019-09-04 LAB — PREPARE RBC (CROSSMATCH)

## 2019-09-04 LAB — LIPASE, BLOOD: Lipase: 25 U/L (ref 11–51)

## 2019-09-04 MED ORDER — LORATADINE 10 MG PO TABS
10.0000 mg | ORAL_TABLET | Freq: Every day | ORAL | Status: DC
  Administered 2019-09-06 – 2019-09-09 (×3): 10 mg via ORAL
  Filled 2019-09-04 (×3): qty 1

## 2019-09-04 MED ORDER — ALBUTEROL SULFATE (2.5 MG/3ML) 0.083% IN NEBU
2.5000 mg | INHALATION_SOLUTION | RESPIRATORY_TRACT | Status: DC | PRN
  Administered 2019-09-05: 2.5 mg via RESPIRATORY_TRACT
  Filled 2019-09-04: qty 3

## 2019-09-04 MED ORDER — TRAZODONE HCL 50 MG PO TABS
50.0000 mg | ORAL_TABLET | Freq: Every day | ORAL | Status: DC
  Administered 2019-09-04 – 2019-09-05 (×2): 50 mg via ORAL
  Filled 2019-09-04 (×2): qty 1

## 2019-09-04 MED ORDER — LEVOTHYROXINE SODIUM 50 MCG PO TABS
75.0000 ug | ORAL_TABLET | ORAL | Status: DC
  Administered 2019-09-06: 75 ug via ORAL
  Filled 2019-09-04: qty 2

## 2019-09-04 MED ORDER — SODIUM CHLORIDE 0.9 % IV SOLN: 10.0000 mL/h | Freq: Once | INTRAVENOUS | Status: DC

## 2019-09-04 MED ORDER — INSULIN ASPART 100 UNIT/ML ~~LOC~~ SOLN: 0.0000 [IU] | Freq: Three times a day (TID) | SUBCUTANEOUS | Status: DC

## 2019-09-04 MED ORDER — INSULIN GLARGINE 100 UNIT/ML ~~LOC~~ SOLN
10.0000 [IU] | Freq: Every day | SUBCUTANEOUS | Status: DC
  Administered 2019-09-05: 10 [IU] via SUBCUTANEOUS
  Filled 2019-09-04 (×2): qty 0.1

## 2019-09-04 MED ORDER — ACETAMINOPHEN 500 MG PO TABS: 500.0000 mg | ORAL_TABLET | ORAL | Status: DC | PRN

## 2019-09-04 MED ORDER — SODIUM CHLORIDE 0.9 % IV SOLN
8.0000 mg/h | INTRAVENOUS | Status: AC
  Administered 2019-09-04 – 2019-09-07 (×5): 8 mg/h via INTRAVENOUS
  Filled 2019-09-04 (×6): qty 80

## 2019-09-04 MED ORDER — ONDANSETRON HCL 4 MG/2ML IJ SOLN: 4.0000 mg | Freq: Once | INTRAMUSCULAR | Status: AC

## 2019-09-04 MED ORDER — PANTOPRAZOLE SODIUM 40 MG IV SOLR
40.0000 mg | Freq: Two times a day (BID) | INTRAVENOUS | Status: DC
  Administered 2019-09-08 – 2019-09-09 (×3): 40 mg via INTRAVENOUS
  Filled 2019-09-04 (×3): qty 40

## 2019-09-04 MED ORDER — TRANEXAMIC ACID-NACL 1000-0.7 MG/100ML-% IV SOLN
1000.0000 mg | Freq: Once | INTRAVENOUS | Status: AC
  Administered 2019-09-04: 1000 mg via INTRAVENOUS
  Filled 2019-09-04: qty 100

## 2019-09-04 MED ORDER — ONDANSETRON HCL 4 MG/2ML IJ SOLN
INTRAMUSCULAR | Status: AC
  Administered 2019-09-04: 4 mg via INTRAVENOUS
  Filled 2019-09-04: qty 2

## 2019-09-04 MED ORDER — FLUOXETINE HCL 10 MG PO CAPS
10.0000 mg | ORAL_CAPSULE | Freq: Every day | ORAL | Status: DC
  Administered 2019-09-06 – 2019-09-09 (×3): 10 mg via ORAL
  Filled 2019-09-04 (×5): qty 1

## 2019-09-04 MED ORDER — SODIUM CHLORIDE 0.9 % IV SOLN
80.0000 mg | Freq: Once | INTRAVENOUS | Status: AC
  Administered 2019-09-04: 80 mg via INTRAVENOUS
  Filled 2019-09-04: qty 80

## 2019-09-04 MED ORDER — LEVOTHYROXINE SODIUM 50 MCG PO TABS
50.0000 ug | ORAL_TABLET | ORAL | Status: DC
  Administered 2019-09-07 – 2019-09-09 (×2): 50 ug via ORAL
  Filled 2019-09-04 (×2): qty 1

## 2019-09-04 MED ORDER — MEXILETINE HCL 200 MG PO CAPS
200.0000 mg | ORAL_CAPSULE | Freq: Two times a day (BID) | ORAL | Status: DC
  Administered 2019-09-04 – 2019-09-09 (×7): 200 mg via ORAL
  Filled 2019-09-04 (×13): qty 1

## 2019-09-04 MED ORDER — RANOLAZINE ER 500 MG PO TB12: 500.0000 mg | ORAL_TABLET | Freq: Two times a day (BID) | ORAL | Status: DC

## 2019-09-04 MED ORDER — SODIUM CHLORIDE 0.9 % IV SOLN: INTRAVENOUS | Status: DC

## 2019-09-04 MED ORDER — MAGNESIUM OXIDE 400 (241.3 MG) MG PO TABS
200.0000 mg | ORAL_TABLET | Freq: Every day | ORAL | Status: DC
  Administered 2019-09-06 – 2019-09-09 (×3): 200 mg via ORAL
  Filled 2019-09-04 (×3): qty 1

## 2019-09-04 MED ORDER — TORSEMIDE 20 MG PO TABS
40.0000 mg | ORAL_TABLET | Freq: Every day | ORAL | Status: DC
  Administered 2019-09-06 – 2019-09-09 (×3): 40 mg via ORAL
  Filled 2019-09-04 (×5): qty 2

## 2019-09-04 MED ORDER — SIMVASTATIN 20 MG PO TABS
20.0000 mg | ORAL_TABLET | Freq: Every day | ORAL | Status: DC
  Administered 2019-09-04 – 2019-09-08 (×5): 20 mg via ORAL
  Filled 2019-09-04: qty 1
  Filled 2019-09-04: qty 2
  Filled 2019-09-04 (×4): qty 1

## 2019-09-04 MED ORDER — ONDANSETRON HCL 4 MG/2ML IJ SOLN
4.0000 mg | Freq: Once | INTRAMUSCULAR | Status: AC
  Administered 2019-09-04: 4 mg via INTRAVENOUS
  Filled 2019-09-04: qty 2

## 2019-09-04 NOTE — ED Notes (Signed)
Pt resting comfortably, nausea improved

## 2019-09-04 NOTE — ED Notes (Signed)
Pt reports nausea

## 2019-09-04 NOTE — H&P (Signed)
History and Physical    AHMIA COLFORD ZOX:[id number] DOB: 10/23/1940 DOA: 09/04/2019  PCP: [name], MD (Confirm with patient/family/NH records and if not entered, this has to be entered at [facility] point of entry) Patient coming from: home  I have personally briefly reviewed patient's old medical records in [place]  Chief Complaint: hematemesis  HPI: Heather Peterson is a 79 y.o. female with medical history significant of CAD, combined CHF, CKD, DM, hypothyroid disease. She is followed by GI and heart failure clinic.She underwent colonoscopy 09/01/19 with a finding of diverticulosis w/o sign of bleeding and had two sessile polyps removed. 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 Today at home she had hematemesis of a reportedly large amount of blood with clots. She also had minor hematochezia. This lead to her presenting to [facility] ED for evaluation. She has not had any chest pain, increased respiratory distress, abdominal pain.   ED Course: Patient was hypotensive at 62/38 with a normal heart rate. Lab revealed a Hgb of 6.6. She was given transexamic acid. She was transfused 2 u PRBCs. Her BP improved. GI on-call for [facility] was called who recommended [facility] admit the patient and they would consult in the AM  Review of Systems: As per HPI otherwise 10 point review of systems negative.    Past Medical History:  Diagnosis Date  . Anxiety   . Chronic combined systolic and diastolic CHF (congestive heart failure) ([place])   . Chronic kidney disease   . Coronary artery disease   . Depression   . Diabetes mellitus without complication ([place])   . Diabetes mellitus, type II ([place])   . Hypertension   . MI (myocardial infarction) ([place])    x 5  .  Pacemaker   . Prolonged Q-T interval on ECG   . Thyroid disease     Past Surgical History:  Procedure Laterality Date  . CENTRAL LINE INSERTION  03/11/2017   Procedure: CENTRAL LINE INSERTION;  Surgeon: Leonie Man, MD;  Location: [place] [facility];  Service: Cardiovascular;;  . CHOLECYSTECTOMY    . COLONOSCOPY WITH PROPOFOL N/A 09/01/2019   Procedure: COLONOSCOPY WITH PROPOFOL;  Surgeon: Toledo, Benay Pike, MD;  Location: [facility] ENDOSCOPY;  Service: Gastroenterology;  Laterality: N/A;  . CORONARY STENT INTERVENTION W/IMPELLA N/A 03/11/2017   Procedure: Coronary Stent Intervention w/Impella;  Surgeon: Leonie Man, MD;  Location: [place] [facility];  Service: Cardiovascular;  Laterality: N/A;  . ESOPHAGOGASTRODUODENOSCOPY (EGD) WITH PROPOFOL N/A 09/01/2019   Procedure: ESOPHAGOGASTRODUODENOSCOPY (EGD) WITH PROPOFOL;  Surgeon: Toledo, Benay Pike, MD;  Location: [facility] ENDOSCOPY;  Service: Gastroenterology;  Laterality: N/A;  . EYE SURGERY    . INTRAVASCULAR PRESSURE WIRE/FFR STUDY N/A 03/11/2017   Procedure: INTRAVASCULAR PRESSURE WIRE/FFR STUDY;  Surgeon: Leonie Man, MD;  Location: [place] [facility];  Service: Cardiovascular;  Laterality: N/A;  . INTRAVASCULAR ULTRASOUND/IVUS N/A 03/11/2017   Procedure: Intravascular Ultrasound/IVUS;  Surgeon: Leonie Man, MD;  Location: [place] [facility];  Service: Cardiovascular;  Laterality: N/A;  . LEFT HEART CATH AND CORONARY ANGIOGRAPHY N/A 03/05/2017   Procedure: LEFT HEART CATH AND CORONARY ANGIOGRAPHY;  Surgeon: Isaias Cowman, MD;  Location: [place] [facility];  Service: Cardiovascular;  Laterality: N/A;  . PACEMAKER IMPLANT    . pacemaker/defibrillator Left  Soc Hx - married 56 years. Two children - 1 daughter who is actively engaged in her care and 1 son, 2 grandchildren and 1 great grandchild. 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 She lives independently with her husband.   reports that  she has been smoking e-cigarettes and cigarettes. She has been smoking about 0.25 packs per day. She has never used smokeless tobacco. She reports previous alcohol use. She reports that she does not use drugs.  Allergies  Allergen Reactions  . Celebrex [Celecoxib] Anaphylaxis  . Glipizide Anaphylaxis  . Lisinopril Swelling    Lip and facial swelling  . Sulfa Antibiotics Other (See Comments) and Anaphylaxis    Reaction: unknown  . Levaquin [Levofloxacin In D5w] Other (See Comments)    Heart arrhthymias  . Levofloxacin Other (See Comments)    ICD fired  . Metformin Other (See Comments)    Gi tolerance   . Penicillins Rash and Other (See Comments)    Has patient had a PCN reaction causing immediate rash, facial/tongue/throat swelling, SOB or lightheadedness with hypotension: Unknown Has patient had a PCN reaction causing severe rash involving mucus membranes or skin necrosis: No Has patient had a PCN reaction that required hospitalization: No Has patient had a PCN reaction occurring within the last 10 years: No If all of the above answers are "NO", then may proceed with Cephalosporin use.     Family History  Problem Relation Age of Onset  . Hypertension Father   . Heart attack Father   . Depression Sister   . Depression Brother   . Depression Brother     Prior to Admission medications   Medication Sig Start Date End Date Taking? Authorizing Provider  acetaminophen (TYLENOL) 500 MG tablet Take 500 mg by mouth every 4 (four) hours as needed for mild pain, fever or headache.    Yes [provider]  albuterol (PROVENTIL HFA;VENTOLIN HFA) 108 (90 Base) MCG/ACT inhaler Inhale 2 puffs into the lungs every 4 (four) hours as needed for wheezing or shortness of breath. 02/05/17  Yes Gouru, Illene Silver, MD  aspirin EC 81 MG tablet Take 81 mg by mouth at bedtime.    Yes [provider]  clopidogrel (PLAVIX) 75 MG tablet Take 75 mg by mouth daily.   Yes [provider]    ferrous sulfate 324 MG TBEC Take 324 mg by mouth daily.   Yes [provider]  fexofenadine (ALLEGRA) 180 MG tablet Take 180 mg by mouth daily.   Yes [provider]  FLUoxetine (PROZAC) 10 MG capsule Take 10 mg by mouth daily. 06/29/19  Yes [provider]  LANTUS SOLOSTAR 100 UNIT/ML Solostar Pen Inject 16 Units into the skin at bedtime. 06/09/19  Yes [provider]  levothyroxine (SYNTHROID) 50 MCG tablet Take 50 mcg by mouth See admin instructions. Take 1 tablet (70mg) by mouth every Tuesday, Thursday, Saturday and Sunday before breakfast   Yes [provider]  levothyroxine (SYNTHROID, LEVOTHROID) 75 MCG tablet Take 75 mcg by mouth See admin instructions. Take 1 tablet (71m) by mouth every Monday, Wednesday and Friday morning before breakfast   Yes [provider]  magnesium oxide (MAG-OX) 400 MG tablet Take 200 mg by mouth daily.   Yes [provider]  mexiletine (MEXITIL) 200 MG capsule Take 1 capsule (200 mg total) by mouth every 12 (twelve) hours. 03/14/17  Yes Seiler, Amber K, NP  midodrine (PROAMATINE) 5 MG tablet Take 5 mg by  mouth daily as needed.    Yes [provider]  OXYGEN Inhale 2 L into the lungs continuous.   Yes [provider]  pantoprazole (PROTONIX) 40 MG tablet Take 40 mg by mouth 2 (two) times daily.  05/16/19  Yes [provider]  Prenatal Vit-Fe Fumarate-FA (PRENATAL MULTIVITAMIN) TABS tablet Take 1 tablet by mouth daily.   Yes [provider]  ranolazine (RANEXA) 500 MG 12 hr tablet Take 1 tablet (500 mg total) by mouth 2 (two) times daily. 03/14/17  Yes Seiler, Amber K, NP  simvastatin (ZOCOR) 40 MG tablet Take 20 mg by mouth at bedtime.    Yes [provider]  sodium chloride (OCEAN) 0.65 % SOLN nasal spray Place 1 spray into both nostrils as needed for congestion. 07/05/19  Yes Fritzi Mandes, MD  torsemide (DEMADEX) 20 MG tablet Take 2 tablets (40 mg total) by mouth  daily. 08/27/19 11/25/19 Yes Hackney, Otila Kluver A, FNP  traZODone (DESYREL) 50 MG tablet Take 50 mg by mouth at bedtime.   Yes [provider]  triamcinolone (NASACORT) 55 MCG/ACT AERO nasal inhaler Place 2 sprays into the nose 2 (two) times daily. 07/05/19  Yes Fritzi Mandes, MD    Physical Exam: Vitals:   09/04/19 2140 09/04/19 2145 09/04/19 2150 09/04/19 2155  BP: 96/60 (!) 87/55 (!) 96/55 (!) 92/56  Pulse: 74 77 77 77  Resp: 11 17 15 16   Temp:      TempSrc:      SpO2: 98% 100% 98% 98%  Weight:      Height:        Constitutional: NAD, calm, comfortable Vitals:   09/04/19 2140 09/04/19 2145 09/04/19 2150 09/04/19 2155  BP: 96/60 (!) 87/55 (!) 96/55 (!) 92/56  Pulse: 74 77 77 77  Resp: 11 17 15 16   Temp:      TempSrc:      SpO2: 98% 100% 98% 98%  Weight:      Height:       General -  WNWD woman in no distress, fully conversant. Eyes: PERRL, lids and conjunctivae normal ENMT: Mucous membranes are moist. Posterior pharynx clear of any exudate or lesions.Full dentures.  Neck: normal, supple, no masses, no thyromegaly Respiratory: clear to auscultation bilaterally, no wheezing, no crackles. Normal respiratory effort. No accessory muscle use.  Cardiovascular: Regular rate and rhythm, no murmurs / rubs / gallops. Trace extremity edema. 1+ pedal pulses. No carotid bruits.  Abdomen: Protuberant, no tenderness, no masses palpated. No hepatosplenomegaly but exam hindered by girth. Bowel sounds hypoactive.  Musculoskeletal: no clubbing / cyanosis. No joint deformity upper and lower extremities. Good ROM, no contractures. Normal muscle tone.  Skin: no rashes, multiple superficial lesions bilateral LE, no ulcers. No induration Neurologic: CN 2-12 grossly intact. Sensation intact.  Psychiatric: Normal judgment and insight. Alert and oriented x 3. Normal mood.     Labs on Admission: I have personally reviewed following labs and imaging studies  CBC: Recent Labs  Lab 09/04/19 1800    WBC 8.7  NEUTROABS 5.4  HGB 6.6*  HCT 20.5*  MCV 95.8  PLT 937   Basic Metabolic Panel: Recent Labs  Lab 09/04/19 1800  NA 136  K 4.3  CL 97*  CO2 30  GLUCOSE 106*  BUN 37*  CREATININE 2.50*  CALCIUM 8.4*   GFR: Estimated Creatinine Clearance: 20.1 mL/min (A) (by C-G formula based on SCr of 2.5 mg/dL (H)). Liver Function Tests: Recent Labs  Lab 09/04/19 1800  AST 19  ALT 10  ALKPHOS 67  BILITOT 1.0  PROT 6.6  ALBUMIN 3.1*   Recent Labs  Lab 09/04/19 1800  LIPASE 25   No results for input(s): AMMONIA in the last 168 hours. Coagulation Profile: Recent Labs  Lab 09/04/19 1800  INR 1.4*   Cardiac Enzymes: No results for input(s): CKTOTAL, CKMB, CKMBINDEX, TROPONINI in the last 168 hours. BNP (last 3 results) No results for input(s): PROBNP in the last 8760 hours. HbA1C: No results for input(s): HGBA1C in the last 72 hours. CBG: Recent Labs  Lab 09/01/19 0837 09/01/19 1007  GLUCAP 59* 85   Lipid Profile: No results for input(s): CHOL, HDL, LDLCALC, TRIG, CHOLHDL, LDLDIRECT in the last 72 hours. Thyroid Function Tests: No results for input(s): TSH, T4TOTAL, FREET4, T3FREE, THYROIDAB in the last 72 hours. Anemia Panel: No results for input(s): VITAMINB12, FOLATE, FERRITIN, TIBC, IRON, RETICCTPCT in the last 72 hours. Urine analysis:    Component Value Date/Time   COLORURINE YELLOW 09/04/2019 1800   APPEARANCEUR CLEAR 09/04/2019 1800   LABSPEC 1.015 09/04/2019 1800   PHURINE 7.5 09/04/2019 1800   GLUCOSEU NEGATIVE 09/04/2019 1800   HGBUR LARGE (A) 09/04/2019 1800   BILIRUBINUR NEGATIVE 09/04/2019 1800   KETONESUR NEGATIVE 09/04/2019 1800   PROTEINUR 30 (A) 09/04/2019 1800   NITRITE NEGATIVE 09/04/2019 1800   LEUKOCYTESUR SMALL (A) 09/04/2019 1800    Radiological Exams on Admission: No results found.  EKG: Independently reviewed. 08/27/19  Sinus rhythm, left posterior fascicularblock  Assessment/Plan Active Problems:   UGI bleed   HTN  (hypertension)   Diabetes (HCC)   CHF (congestive heart failure), NYHA class III, chronic, combined (HCC)   COPD (chronic obstructive pulmonary disease) (HCC)   Diabetes mellitus type 2, uncomplicated (HCC)   CKD (chronic kidney disease), stage III  (please populate well all problems here in Problem List. (For example, if patient is on BP meds at home and you resume or decide to hold them, it is a problem that needs to be her. Same for CAD, COPD, HLD and so on)   1. UGI bleed - patient with hematemesis and acute anemia. EGD raises possible source being esophagitis or gastritis/gastropathy, made worse by being on plavix. Doubt LGI is a major contributor. GI aware. Completed 2 unit transfusion. No evidence of active bleeding at exam Plan Stepdown admit  IV protonix infusion  Hold plavix, being given transxamic acid infusion  H/H two hours after transfusion - goal to keep Hgb 8g or greater.  GI to see 09/05/19  2. 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 No acute pulmonary edema Plan Continue home meds  Consider repeat abdominal U/S to assess for ascites - last study 4/9 with small ascite.  3. COPD - stable. Plan Continue home regimen  4. DM - last A1C 7.2 % mid-February Plan  Continue home dose of Lantus  Sliding scale      DVT prophylaxis: SCDs.   Code Status: partial code: CPR/DNI  Family Communication: spoke with daughter by phone. Understands dx and tx plan Disposition Plan: Home in 48-72 hours  Consults called: Dr. Allen Norris for GI  Admission status: stepdown    Adella Hare MD [facility] Pager [phone]  If 7PM-7AM, please contact night-coverage [email or web address] Password [facility]  09/04/2019, 10:26 PM

## 2019-09-04 NOTE — ED Notes (Addendum)
Heather Peterson [phone]   called to update  Admitting Dr Linda Hedges at bedside  Pt hx of COPD and uses 2lpm [place] at home; pt will be NPO after midnight

## 2019-09-04 NOTE — ED Notes (Addendum)
Pt moved to trendelenburg  O2 placed back into nose

## 2019-09-04 NOTE — ED Notes (Signed)
Pt given water at this time. Pt denies any further needs

## 2019-09-04 NOTE — ED Triage Notes (Signed)
Pt to ED via [facility] from home. Per EMS pt had a endoscopy and colonoscopy on Wednesday. Since pt has had bright red blood in her emesis with notable clots. Pt initially BP 86/45 and repeat BP 93/55.   Upon arrival pt assisted to bathroom. Pt with notable dark blood in stool and when she wiped. Pt only c/o nausea. Pt on 2L [place] chronically at home.

## 2019-09-04 NOTE — ED Provider Notes (Signed)
[facility] Emergency Department Provider Note       Time seen: ----------------------------------------- 5:49 PM on 09/04/2019 -----------------------------------------   I have reviewed the triage vital signs and the nursing notes.  HISTORY   Chief Complaint No chief complaint on file.    HPI Heather Peterson is a 79 y.o. female with a history of anxiety, CHF, coronary disease, depression, diabetes, hypertension, MI who presents to the ED for gastrointestinal bleeding.  EMS reports that she vomited bright red blood at home, she has also had some rectal bleeding.  Recently she had endoscopy and colonoscopy.  Past Medical History:  Diagnosis Date  . Anxiety   . Chronic combined systolic and diastolic CHF (congestive heart failure) ([place])   . Chronic kidney disease   . Coronary artery disease   . Depression   . Diabetes mellitus without complication ([place])   . Diabetes mellitus, type II ([place])   . Hypertension   . MI (myocardial infarction) ([place])    x 5  . Pacemaker   . Prolonged Q-T interval on ECG   . Thyroid disease     Patient Active Problem List   Diagnosis Date Noted  . Dehydration   . Anemia in chronic kidney disease (CKD) 07/04/2019  . CKD (chronic kidney disease), stage III 07/04/2019  . Chronic respiratory failure with hypoxia ([place]) 07/03/2019  . Hyponatremia 07/03/2019  . Prolonged Q-T interval on ECG   . Weakness   . Anemia   . MDD (major depressive disorder), recurrent episode, mild ([place]) 12/14/2018  . Tobacco use disorder 12/14/2018  . At risk for prolonged QT interval syndrome 12/14/2018  . 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 Pedal edema 02/19/2018  . SOB (shortness of breath) on exertion 02/19/2018  . Left main coronary artery disease 03/07/2017  . Drug-induced torsades de pointes 03/06/2017  . Ventricular tachycardia ([place]) 03/05/2017  . Defibrillator discharge 03/04/2017  . CHF (congestive heart failure),  NYHA class III, chronic, combined ([place]) 03/04/2017  . COPD (chronic obstructive pulmonary disease) ([place]) 02/10/2017  . Diabetes mellitus type 2, uncomplicated ([place]) 78/58/8502  . H/O ventricular fibrillation 02/10/2017  . Hyperlipidemia 02/10/2017  . 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 Ischemic cardiomyopathy 02/10/2017  . Myocardial infarction ([place]) 02/10/2017  . Moderate mitral regurgitation 02/10/2017  . Syncope and collapse 02/04/2017  . CAD (coronary artery disease) 02/04/2017  . HTN (hypertension) 02/04/2017  . Diabetes ([place]) 02/04/2017    Past Surgical History:  Procedure Laterality Date  . CENTRAL LINE INSERTION  03/11/2017   Procedure: CENTRAL LINE INSERTION;  Surgeon: Leonie Man, MD;  Location: [place] [facility];  Service: Cardiovascular;;  . CHOLECYSTECTOMY    . COLONOSCOPY WITH PROPOFOL N/A 09/01/2019   Procedure: COLONOSCOPY WITH PROPOFOL;  Surgeon: Toledo, Benay Pike, MD;  Location: [facility] ENDOSCOPY;  Service: Gastroenterology;  Laterality: N/A;  . CORONARY STENT INTERVENTION W/IMPELLA N/A 03/11/2017   Procedure: Coronary Stent Intervention w/Impella;  Surgeon: Leonie Man, MD;  Location: [place] [facility];  Service: Cardiovascular;  Laterality: N/A;  . ESOPHAGOGASTRODUODENOSCOPY (EGD) WITH PROPOFOL N/A 09/01/2019   Procedure: ESOPHAGOGASTRODUODENOSCOPY (EGD) WITH PROPOFOL;  Surgeon: Toledo, Benay Pike, MD;  Location: [facility] ENDOSCOPY;  Service: Gastroenterology;  Laterality: N/A;  . EYE SURGERY    . INTRAVASCULAR PRESSURE WIRE/FFR STUDY N/A 03/11/2017   Procedure: INTRAVASCULAR PRESSURE WIRE/FFR STUDY;  Surgeon: Leonie Man, MD;  Location: [place] [facility];  Service: Cardiovascular;  Laterality: N/A;  . INTRAVASCULAR ULTRASOUND/IVUS N/A 03/11/2017  Procedure: Intravascular Ultrasound/IVUS;  Surgeon: Leonie Man, MD;  Location: [place] [facility];  Service: Cardiovascular;  Laterality: N/A;  . LEFT HEART CATH AND CORONARY  ANGIOGRAPHY N/A 03/05/2017   Procedure: LEFT HEART CATH AND CORONARY ANGIOGRAPHY;  Surgeon: Isaias Cowman, MD;  Location: [place] [facility];  Service: Cardiovascular;  Laterality: N/A;  . PACEMAKER IMPLANT    . pacemaker/defibrillator Left     Allergies Celebrex [celecoxib], Glipizide, Lisinopril, Sulfa antibiotics, Levaquin [levofloxacin in d5w], Levofloxacin, Metformin, and Penicillins  Social History Social History   Tobacco Use  . Smoking status: Current Every Day Smoker    Packs/day: 0.25    Types: E-cigarettes, Cigarettes  . Smokeless tobacco: Never Used  Substance Use Topics  . Alcohol use: Not Currently    Comment: occasionally  . Drug use: No    Review of Systems Constitutional: Negative for fever. Cardiovascular: Negative for chest pain. Respiratory: Negative for shortness of breath. Gastrointestinal: Positive for hematemesis and rectal bleeding Musculoskeletal: Negative for back pain. Skin: Negative for rash. Neurological: Negative for headaches, focal weakness or numbness.  All systems negative/normal/unremarkable except as stated in the HPI  ____________________________________________   PHYSICAL EXAM:  VITAL SIGNS: ED Triage Vitals  Enc Vitals Group     BP      Pulse      Resp      Temp      Temp src      SpO2      Weight      Height      Head Circumference      Peak Flow      Pain Score      Pain Loc      Pain Edu?      Excl. in [place]?     Constitutional: Alert and oriented.  Chronically ill-appearing Eyes: Conjunctivae are normal. Normal extraocular movements. Cardiovascular: Normal rate, regular rhythm. No murmurs, rubs, or gallops. Respiratory: Normal respiratory effort without tachypnea nor retractions. Breath sounds are clear and equal bilaterally. No wheezes/rales/rhonchi. Gastrointestinal: Soft and nontender. Normal bowel sounds Musculoskeletal: Nontender with normal range of motion in extremities. No lower extremity  tenderness nor edema. Neurologic:  Normal speech and language. No gross focal neurologic deficits are appreciated.  Skin:  Scattered bruises over extremities,  Pallor is noted Psychiatric: Mood and affect are normal. Speech and behavior are normal.  ____________________________________________  EKG: Interpreted by me.  Sinus rhythm with rate of 82 bpm, long QT, normal axis  ____________________________________________  ED COURSE:  As part of my medical decision making, I reviewed the following data within the [place] History obtained from family if available, nursing notes, old chart and ekg, as well as notes from prior ED visits. Patient presented for hematemesis and rectal bleeding, we will assess with labs and imaging as indicated at this time.   Procedures  Heather Peterson was evaluated in Emergency Department on 09/04/2019 for the symptoms described in the history of present illness. She was evaluated in the context of the global COVID-19 pandemic, which necessitated consideration that the patient might be at risk for infection with the SARS-CoV-2 virus that causes COVID-19. Institutional protocols and algorithms that pertain to the evaluation of patients at risk for COVID-19 are in a state of rapid change based on information released by regulatory bodies including the CDC and federal and state organizations. These policies and algorithms were followed during the patient's care in the ED.  ____________________________________________   LABS (pertinent positives/negatives)  Labs Reviewed  CBC WITH DIFFERENTIAL/PLATELET - Abnormal; Notable for the following components:      Result Value   RBC 2.14 (*)    Hemoglobin 6.6 (*)    HCT 20.5 (*)    RDW 21.2 (*)    nRBC 0.7 (*)    Abs Immature Granulocytes 0.10 (*)    All other components within normal limits  COMPREHENSIVE METABOLIC PANEL - Abnormal; Notable for the following components:   Chloride 97 (*)     Glucose, Bld 106 (*)    BUN 37 (*)    Creatinine, Ser 2.50 (*)    Calcium 8.4 (*)    Albumin 3.1 (*)    GFR calc non Af Amer 18 (*)    GFR calc Af Amer 21 (*)    All other components within normal limits  URINALYSIS, COMPLETE (UACMP) WITH MICROSCOPIC - Abnormal; Notable for the following components:   Hgb urine dipstick LARGE (*)    Protein, ur 30 (*)    Leukocytes,Ua SMALL (*)    Bacteria, UA RARE (*)    All other components within normal limits  PROTIME-INR - Abnormal; Notable for the following components:   Prothrombin Time 17.0 (*)    INR 1.4 (*)    All other components within normal limits  LIPASE, BLOOD  POC OCCULT BLOOD, ED  TYPE AND SCREEN  PREPARE RBC (CROSSMATCH)  TROPONIN I (HIGH SENSITIVITY)   CRITICAL CARE Performed by: Laurence Aly   Total critical care time: 30 minutes  Critical care time was exclusive of separately billable procedures and treating other patients.  Critical care was necessary to treat or prevent imminent or life-threatening deterioration.  Critical care was time spent personally by me on the following activities: development of treatment plan with patient and/or surrogate as well as nursing, discussions with consultants, evaluation of patient's response to treatment, examination of patient, obtaining history from patient or surrogate, ordering and performing treatments and interventions, ordering and review of laboratory studies, ordering and review of radiographic studies, pulse oximetry and re-evaluation of patient's condition.  ____________________________________________   DIFFERENTIAL DIAGNOSIS   Anemia, coagulopathy, peptic ulcer disease, diverticulosis, gastrointestinal hemorrhage  FINAL ASSESSMENT AND PLAN  GI bleeding, acute blood loss anemia   Plan: The patient had presented for both hematemesis and rectal bleeding. Patient's labs did indicate mildly elevated creatinine compared to prior, she did have acute blood loss  with a hemoglobin of 6.6, recent test was 9.6.  She has lost 3 g/dL.  On evaluation she was mildly hypotensive and we have started giving a blood transfusion.  I have ordered 2 units of blood for her.  I have consulted with gastroenterology, she had received Protonix IV bolus and infusion.   Laurence Aly, MD    Note: This note was generated in part or whole with voice recognition software. Voice recognition is usually quite accurate but there are transcription errors that can and very often do occur. I apologize for any typographical errors that were not detected and corrected.     Earleen Newport, MD 09/04/19 Lurena Nida

## 2019-09-05 DIAGNOSIS — K922 Gastrointestinal hemorrhage, unspecified: Secondary | ICD-10-CM | POA: Diagnosis not present

## 2019-09-05 DIAGNOSIS — D62 Acute posthemorrhagic anemia: Secondary | ICD-10-CM

## 2019-09-05 LAB — BASIC METABOLIC PANEL
Anion gap: 10 (ref 5–15)
BUN: 36 mg/dL — ABNORMAL HIGH (ref 8–23)
CO2: 30 mmol/L (ref 22–32)
Calcium: 8.4 mg/dL — ABNORMAL LOW (ref 8.9–10.3)
Chloride: 98 mmol/L (ref 98–111)
Creatinine, Ser: 2.36 mg/dL — ABNORMAL HIGH (ref 0.44–1.00)
GFR calc Af Amer: 22 mL/min — ABNORMAL LOW (ref >60)
GFR calc non Af Amer: 19 mL/min — ABNORMAL LOW (ref >60)
Glucose, Bld: 104 mg/dL — ABNORMAL HIGH (ref 70–99)
Potassium: 4.2 mmol/L (ref 3.5–5.1)
Sodium: 138 mmol/L (ref 135–145)

## 2019-09-05 LAB — RESPIRATORY PANEL BY RT PCR (FLU A&B, COVID)
Influenza A by PCR: NEGATIVE
Influenza B by PCR: NEGATIVE
SARS Coronavirus 2 by RT PCR: NEGATIVE

## 2019-09-05 LAB — MRSA PCR SCREENING: MRSA by PCR: NEGATIVE

## 2019-09-05 LAB — HEMOGLOBIN AND HEMATOCRIT, BLOOD
HCT: 22.2 % — ABNORMAL LOW (ref 36.0–46.0)
Hemoglobin: 7.4 g/dL — ABNORMAL LOW (ref 12.0–15.0)

## 2019-09-05 LAB — PREPARE RBC (CROSSMATCH)

## 2019-09-05 LAB — HEMOGLOBIN
Hemoglobin: 6.8 g/dL — ABNORMAL LOW (ref 12.0–15.0)
Hemoglobin: 7.1 g/dL — ABNORMAL LOW (ref 12.0–15.0)
Hemoglobin: 7.4 g/dL — ABNORMAL LOW (ref 12.0–15.0)

## 2019-09-05 LAB — GLUCOSE, CAPILLARY
Glucose-Capillary: 132 mg/dL — ABNORMAL HIGH (ref 70–99)
Glucose-Capillary: 92 mg/dL (ref 70–99)
Glucose-Capillary: 93 mg/dL (ref 70–99)
Glucose-Capillary: 110 mg/dL — ABNORMAL HIGH (ref 70–99)
Glucose-Capillary: 96 mg/dL (ref 70–99)

## 2019-09-05 MED ORDER — SALINE SPRAY 0.65 % NA SOLN
1.0000 | NASAL | Status: DC | PRN
  Administered 2019-09-05: 1 via NASAL
  Filled 2019-09-05: qty 44

## 2019-09-05 MED ORDER — ALPRAZOLAM 0.25 MG PO TABS
0.2500 mg | ORAL_TABLET | Freq: Once | ORAL | Status: AC
  Administered 2019-09-06: 0.25 mg via ORAL
  Filled 2019-09-05: qty 1

## 2019-09-05 MED ORDER — SODIUM CHLORIDE 0.9% IV SOLUTION: Freq: Once | INTRAVENOUS | Status: AC

## 2019-09-05 MED ORDER — CHLORHEXIDINE GLUCONATE 0.12 % MT SOLN
15.0000 mL | Freq: Two times a day (BID) | OROMUCOSAL | Status: DC
  Administered 2019-09-05 – 2019-09-09 (×7): 15 mL via OROMUCOSAL
  Filled 2019-09-05 (×7): qty 15

## 2019-09-05 MED ORDER — CHLORHEXIDINE GLUCONATE CLOTH 2 % EX PADS
6.0000 | MEDICATED_PAD | Freq: Every day | CUTANEOUS | Status: DC
  Administered 2019-09-06: 6 via TOPICAL

## 2019-09-05 MED ORDER — ORAL CARE MOUTH RINSE
15.0000 mL | Freq: Two times a day (BID) | OROMUCOSAL | Status: DC
  Administered 2019-09-06 – 2019-09-08 (×5): 15 mL via OROMUCOSAL

## 2019-09-05 NOTE — Consult Note (Signed)
Heather Lame, MD [facility]  [address] [place], [place] [address] Phone: [phone] Fax : [phone]  Consultation  Referring Provider:     Dr. Jimmye Norman Primary Care Physician:  [name], MD Primary Gastroenterologist:  Dr. Alice Reichert         Reason for Consultation:     Hematemesis  Date of Admission:  09/04/2019 Date of Consultation:  09/05/2019         HPI:   Heather Peterson is a 79 y.o. female who underwent an EGD and colonoscopy 4 days ago by Dr. Alice Reichert.  At that time the upper endoscopy and colonoscopy showed:  EGD 09/01/2019 - Esophageal plaques were found, suspicious for candidiasis. Cells for cytology obtained. - Esophageal mucosal changes suggestive of eosinophilic esophagitis. Biopsied. - Z-line irregular, at the gastroesophageal junction. Biopsied. - Gastritis. - 1 cm hiatal hernia. - Normal examined duodenum. - Moderate portal hypertensive gastropathy  Colonoscopy 09/01/2019 - Severe diverticulosis in the sigmoid colon. There was no evidence of diverticular bleeding. - Two 4 to 5 mm polyps in the descending colon and in the transverse colon, removed with a jumbo cold forceps. Resected and retrieved. - One 7 mm polyp in the transverse colon, removed with a cold snare. Resected and retrieved. - Non-bleeding internal hemorrhoids  The patient had these procedures done for heme positive stools and unexplained iron deficiency anemia.  The patient was reported to have history of cirrhosis and a history of colon polyps at her last visit with Dr. Alice Reichert.  Upon admission to the ED the patient had reported 1 episode of vomiting fresh blood with clots and a single episode of black stools.  Due to the patient having a hemoglobin of 6.6 the patient received 2 units of blood with a hemoglobin of 7.4 at midnight.  Repeat hemoglobin this morning was 6.8.  The patient denies any further nausea vomiting since being admitted.  The patient denies any abdominal pain  nausea vomiting fevers or chills.  She states that she started having blood clots with the first episode of vomiting.  She denies retching prior to seeing the blood but states that the bright red blood and blood clots came up immediately with her first vomitus.  The patient states that she had some bright red blood with her melena.  Despite the diagnosis of cirrhosis the upper endoscopy only showed findings consistent with possible Candida esophagitis without any mention of varices.  Past Medical History:  Diagnosis Date  . Anxiety   . Chronic combined systolic and diastolic CHF (congestive heart failure) ([place])   . Chronic kidney disease   . Coronary artery disease   . Depression   . Diabetes mellitus without complication ([place])   . Diabetes mellitus, type II ([place])   . Hypertension   . MI (myocardial infarction) ([place])    x 5  . Pacemaker   . Prolonged Q-T interval on ECG   . Thyroid disease     Past Surgical History:  Procedure Laterality Date  . CENTRAL LINE INSERTION  03/11/2017   Procedure: CENTRAL LINE INSERTION;  Surgeon: Leonie Man, MD;  Location: [place] [facility];  Service: Cardiovascular;;  . CHOLECYSTECTOMY    . COLONOSCOPY WITH PROPOFOL N/A 09/01/2019   Procedure: COLONOSCOPY WITH PROPOFOL;  Surgeon: Toledo, Benay Pike, MD;  Location: [facility] ENDOSCOPY;  Service: Gastroenterology;  Laterality: N/A;  . CORONARY STENT INTERVENTION W/IMPELLA N/A 03/11/2017   Procedure: Coronary Stent Intervention w/Impella;  Surgeon: Leonie Man, MD;  Location: [place] [facility];  Service: Cardiovascular;  Laterality: N/A;  . ESOPHAGOGASTRODUODENOSCOPY (EGD) WITH PROPOFOL N/A 09/01/2019   Procedure: ESOPHAGOGASTRODUODENOSCOPY (EGD) WITH PROPOFOL;  Surgeon: Toledo, Benay Pike, MD;  Location: [facility] ENDOSCOPY;  Service: Gastroenterology;  Laterality: N/A;  . EYE SURGERY    . INTRAVASCULAR PRESSURE WIRE/FFR STUDY N/A 03/11/2017   Procedure: INTRAVASCULAR PRESSURE WIRE/FFR STUDY;  Surgeon:  Leonie Man, MD;  Location: [place] [facility];  Service: Cardiovascular;  Laterality: N/A;  . INTRAVASCULAR ULTRASOUND/IVUS N/A 03/11/2017   Procedure: Intravascular Ultrasound/IVUS;  Surgeon: Leonie Man, MD;  Location: [place] [facility];  Service: Cardiovascular;  Laterality: N/A;  . LEFT HEART CATH AND CORONARY ANGIOGRAPHY N/A 03/05/2017   Procedure: LEFT HEART CATH AND CORONARY ANGIOGRAPHY;  Surgeon: Isaias Cowman, MD;  Location: [place] [facility];  Service: Cardiovascular;  Laterality: N/A;  . PACEMAKER IMPLANT    . pacemaker/defibrillator Left     Prior to Admission medications   Medication Sig Start Date End Date Taking? Authorizing Provider  acetaminophen (TYLENOL) 500 MG tablet Take 500 mg by mouth every 4 (four) hours as needed for mild pain, fever or headache.    Yes [provider]  albuterol (PROVENTIL HFA;VENTOLIN HFA) 108 (90 Base) MCG/ACT inhaler Inhale 2 puffs into the lungs every 4 (four) hours as needed for wheezing or shortness of breath. 02/05/17  Yes Gouru, Illene Silver, MD  aspirin EC 81 MG tablet Take 81 mg by mouth at bedtime.    Yes [provider]  clopidogrel (PLAVIX) 75 MG tablet Take 75 mg by mouth daily.   Yes [provider]  ferrous sulfate 324 MG TBEC Take 324 mg by mouth daily.   Yes [provider]  fexofenadine (ALLEGRA) 180 MG tablet Take 180 mg by mouth daily.   Yes [provider]  FLUoxetine (PROZAC) 10 MG capsule Take 10 mg by mouth daily. 06/29/19  Yes [provider]  LANTUS SOLOSTAR 100 UNIT/ML Solostar Pen Inject 16 Units into the skin at bedtime. 06/09/19  Yes [provider]  levothyroxine (SYNTHROID) 50 MCG tablet Take 50 mcg by mouth See admin instructions. Take 1 tablet (34mg) by mouth every Tuesday, Thursday, Saturday and Sunday before breakfast   Yes [provider]  levothyroxine (SYNTHROID, LEVOTHROID) 75 MCG tablet Take 75 mcg by mouth See admin  instructions. Take 1 tablet (759m) by mouth every Monday, Wednesday and Friday morning before breakfast   Yes [provider]  magnesium oxide (MAG-OX) 400 MG tablet Take 200 mg by mouth daily.   Yes [provider]  mexiletine (MEXITIL) 200 MG capsule Take 1 capsule (200 mg total) by mouth every 12 (twelve) hours. 03/14/17  Yes Seiler, Amber K, NP  midodrine (PROAMATINE) 5 MG tablet Take 5 mg by mouth daily as needed.    Yes [provider]  OXYGEN Inhale 2 L into the lungs continuous.   Yes [provider]  pantoprazole (PROTONIX) 40 MG tablet Take 40 mg by mouth 2 (two) times daily.  05/16/19  Yes [provider]  Prenatal Vit-Fe Fumarate-FA (PRENATAL MULTIVITAMIN) TABS tablet Take 1 tablet by mouth daily.   Yes [provider]  ranolazine (RANEXA) 500 MG 12 hr tablet Take 1 tablet (500 mg total) by mouth 2 (two) times daily. 03/14/17  Yes Seiler, Amber K, NP  simvastatin (ZOCOR) 40 MG tablet Take 20 mg by mouth at bedtime.    Yes [provider]  sodium chloride (OCEAN) 0.65 % SOLN nasal  spray Place 1 spray into both nostrils as needed for congestion. 07/05/19  Yes Fritzi Mandes, MD  torsemide (DEMADEX) 20 MG tablet Take 2 tablets (40 mg total) by mouth daily. 08/27/19 11/25/19 Yes Hackney, Otila Kluver A, FNP  traZODone (DESYREL) 50 MG tablet Take 50 mg by mouth at bedtime.   Yes [provider]  triamcinolone (NASACORT) 55 MCG/ACT AERO nasal inhaler Place 2 sprays into the nose 2 (two) times daily. 07/05/19  Yes Fritzi Mandes, MD    Family History  Problem Relation Age of Onset  . Hypertension Father   . Heart attack Father   . Depression Sister   . Depression Brother   . Depression Brother      Social History   Tobacco Use  . Smoking status: Current Every Day Smoker    Packs/day: 0.25    Types: E-cigarettes, Cigarettes  . Smokeless tobacco: Never Used  Substance Use Topics  . Alcohol use: Not Currently    Comment:  occasionally  . Drug use: No    Allergies as of 09/04/2019 - Review Complete 09/04/2019  Allergen Reaction Noted  . Celebrex [celecoxib] Anaphylaxis 02/04/2017  . Glipizide Anaphylaxis 08/03/2013  . Lisinopril Swelling 05/19/2019  . Sulfa antibiotics Other (See Comments) and Anaphylaxis 08/03/2013  . Levaquin [levofloxacin in d5w] Other (See Comments) 02/04/2017  . Levofloxacin Other (See Comments) 02/10/2017  . Metformin Other (See Comments) 08/03/2013  . Penicillins Rash and Other (See Comments) 08/03/2013    Review of Systems:    All systems reviewed and negative except where noted in HPI.   Physical Exam:  Vital signs in last 24 hours: Temp:  [97.6 F (36.4 C)-98.4 F (36.9 C)] 97.9 F (36.6 C) (04/25 0800) Pulse Rate:  [72-82] 79 (04/25 0800) Resp:  [8-21] 12 (04/25 0800) BP: (62-105)/(38-79) 99/52 (04/25 0800) SpO2:  [92 %-100 %] 97 % (04/25 0800) Weight:  [86.6 kg] 86.6 kg (04/24 1755)   General:   Pleasant, cooperative in NAD Head:  Normocephalic and atraumatic. Eyes:   No icterus.   Conjunctiva pink. PERRLA. Ears:  Normal auditory acuity. Neck:  Supple; no masses or thyroidomegaly Lungs: Respirations even and unlabored. Lungs clear to auscultation bilaterally.   No wheezes, crackles, or rhonchi.  Heart:  Regular rate and rhythm;  Without murmur, clicks, rubs or gallops Abdomen:  Soft, nondistended, nontender. Normal bowel sounds. No appreciable masses or hepatomegaly.  No rebound or guarding.  Rectal:  Not performed. Msk:  Symmetrical without gross deformities.    Extremities:  Without edema, cyanosis or clubbing. Neurologic:  Alert and oriented x3;  grossly normal neurologically. Skin:  Intact without significant lesions or rashes. Cervical Nodes:  No significant cervical adenopathy. Psych:  Alert and cooperative. Normal affect.  LAB RESULTS: Recent Labs    09/04/19 1800 09/05/19 0047  WBC 8.7  --   HGB 6.6* 7.4*  HCT 20.5* 22.2*  PLT 199  --     BMET Recent Labs    09/04/19 1800 09/05/19 0709  NA 136 138  K 4.3 4.2  CL 97* 98  CO2 30 30  GLUCOSE 106* 104*  BUN 37* 36*  CREATININE 2.50* 2.36*  CALCIUM 8.4* 8.4*   LFT Recent Labs    09/04/19 1800  PROT 6.6  ALBUMIN 3.1*  AST 19  ALT 10  ALKPHOS 67  BILITOT 1.0   PT/INR Recent Labs    09/04/19 1800  LABPROT 17.0*  INR 1.4*    STUDIES: No results found.    Impression /  Plan:   Assessment: Active Problems:   HTN (hypertension)   Diabetes (HCC)   CHF (congestive heart failure), NYHA class III, chronic, combined (HCC)   COPD (chronic obstructive pulmonary disease) (HCC)   Diabetes mellitus type 2, uncomplicated (HCC)   CKD (chronic kidney disease), stage III   UGI bleed   Heather Peterson is a 79 y.o. y/o female with recurrent GI bleeding and anemia with hematemesis after having a EGD and colonoscopy a few days ago.  The patient restart her Plavix and her melena and bright red blood per rectum were likely from an upper GI source since she vomited clots.  The patient has had no further GI bleeding but her hemoglobin has dropped.  Plan:  I would hold off on any further endoscopic procedures at this time since the patient started her Plavix again the day after the EGD and colonoscopy.  If there is any question of where the bleeding may be coming from a bleeding scan may be considered although an upper GI sources most likely.  The patient will be considered for a repeat upper endoscopy if she is has any further GI bleeding but would rather hold off until the Plavix has worn off.  She denies any NSAID use or abuse.  I recommend transfusing as needed since her hemoglobin this morning is 6.8.  Dr. Vicente Males will be taking over the service tomorrow.  Thank you for involving me in the care of this patient.      LOS: 1 day   Heather Lame, MD  09/05/2019, 8:32 AM Pager [phone] 7am-5pm  Check AMION for 5pm -7am coverage and on weekends   Note: This dictation  was prepared with Dragon dictation along with smaller phrase technology. Any transcriptional errors that result from this process are unintentional.

## 2019-09-05 NOTE — ED Notes (Signed)
Pt sat up for c/o of SOB

## 2019-09-05 NOTE — Progress Notes (Signed)
PROGRESS NOTE    Heather Peterson  IDP:[id number] DOB: February 12, 1941 DOA: 09/04/2019 PCP: [name], MD (Confirm with patient/family/NH records and if not entered, this HAS to be entered at [facility] point of entry. "No PCP" if truly none.)   Brief Narrative: (Start on day 1 of progress note - keep it brief and live) Patient is 79 year old female with history of coronary disease, combined systolic and diastolic congestive heart failure, chronic kidney disease stage IV, type 2 diabetes, hypothyroidism who presents to the hospital with a GI bleed.  She had a 1 episode of hematemesis, with fresh red blood as well as blood clots.  She also had one episode of black stool.  Upon arrival in the emergency room, she had a hemoglobin of 6.6, received 2 units of PRBC, hemoglobin has increased to 7.4.  She also had a significant hypotension, blood pressure has been better this morning.  She was also receiving Protonix drip. Patient had a recent EGD and colonoscopy on 09/01/2019.  Colon showed diverticulosis with polyps.  Polyp removed.  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 No active bleeding.  Assessment & Plan:   Active Problems:   HTN (hypertension)   Diabetes (HCC)   CHF (congestive heart failure), NYHA class III, chronic, combined (HCC)   COPD (chronic obstructive pulmonary disease) (HCC)   Diabetes mellitus type 2, uncomplicated (HCC)   CKD (chronic kidney disease), stage III   UGI bleed  #1. Acute blood loss anemia with chronic anemia. I reviewed the labs over the past year, patient  baseline hemoglobin has been 9.3. She has received 2 units of transfusion. Patient also has mild iron deficiency in the borderline B12 level on 08/27/2019. We will check homocystine level. Supplement iron when patient able to take p.o. Continue monitor hemoglobin every 6 hours, transfuse when hemoglobin less than 7.  #2. Acute upper GI bleed. Recent EGD did not reveal source of GI  bleed. She may have a small intestine bleeding. GI has been called last night, they will see patient earlier today. May consider bleeding scan if  EGD do not show any source of bleeding. Patient does not have any additional nausea vomiting or black stool.  #3. Chronic combined systolic and diastolic congestive heart failure. No evidence of exacerbation. Will avoid IV fluids if patient blood pressure is acceptable.  #4. COPD. Condition stable.  5. Type 2 diabetes. Patient is on is taking 10 units of Lantus every evening. Due to n.p.o. status, I will discontinue Lantus. Continue sliding scale insulin.  6. Chronic kidney disease stage IV. Renal function still stable.     DVT prophylaxis: SCDs Code Status: Full Family Communication: Plan discussed with the patient, all questions answered. Disposition Plan:  . Patient came from:Home            . Anticipated d/c place:Home . Barriers to d/c OR conditions which need to be met to effect a safe d/c:   Consultants:   GI  Procedures: Possible EGD Antimicrobials: None  Subjective: Patient was transported to ICU at 4 AM today, she could not sleep. Currently, she denies any nausea vomiting, no additional black stool. Denies any short of breath or cough. No dizziness.  Objective: Vitals:   09/05/19 0040 09/05/19 0050 09/05/19 0220 09/05/19 0250  BP: (!) 89/55  96/79 101/67  Pulse: 77 77 80 82  Resp: 17 16 16 20   Temp:      TempSrc:      SpO2: 98% 99% 97%  99%  Weight:      Height:        Intake/Output Summary (Last 24 hours) at 09/05/2019 0749 Last data filed at 09/05/2019 0600 Gross per 24 hour  Intake 869.44 ml  Output 625 ml  Net 244.44 ml   Filed Weights   09/04/19 1755  Weight: 86.6 kg    Examination:  General exam: Appears calm and comfortable  Respiratory system: Clear to auscultation. Respiratory effort normal. Cardiovascular system: S1 & S2 heard, RRR. No JVD, murmurs, rubs, gallops or clicks. No pedal  edema. Gastrointestinal system: Abdomen is nondistended, soft and nontender. No organomegaly or masses felt. Normal bowel sounds heard. Central nervous system: Alert and oriented. No focal neurological deficits. Extremities: Symmetric  Skin: No rashes, lesions or ulcers Psychiatry: Judgement and insight appear normal. Mood & affect appropriate.     Data Reviewed: I have personally reviewed following labs and imaging studies  CBC: Recent Labs  Lab 09/04/19 1800 09/05/19 0047  WBC 8.7  --   NEUTROABS 5.4  --   HGB 6.6* 7.4*  HCT 20.5* 22.2*  MCV 95.8  --   PLT 199  --    Basic Metabolic Panel: Recent Labs  Lab 09/04/19 1800  NA 136  K 4.3  CL 97*  CO2 30  GLUCOSE 106*  BUN 37*  CREATININE 2.50*  CALCIUM 8.4*   GFR: Estimated Creatinine Clearance: 20.1 mL/min (A) (by C-G formula based on SCr of 2.5 mg/dL (H)). Liver Function Tests: Recent Labs  Lab 09/04/19 1800  AST 19  ALT 10  ALKPHOS 67  BILITOT 1.0  PROT 6.6  ALBUMIN 3.1*   Recent Labs  Lab 09/04/19 1800  LIPASE 25   No results for input(s): AMMONIA in the last 168 hours. Coagulation Profile: Recent Labs  Lab 09/04/19 1800  INR 1.4*   Cardiac Enzymes: No results for input(s): CKTOTAL, CKMB, CKMBINDEX, TROPONINI in the last 168 hours. BNP (last 3 results) No results for input(s): PROBNP in the last 8760 hours. HbA1C: No results for input(s): HGBA1C in the last 72 hours. CBG: Recent Labs  Lab 09/01/19 0837 09/01/19 1007 09/05/19 0735  GLUCAP 59* 85 92   Lipid Profile: No results for input(s): CHOL, HDL, LDLCALC, TRIG, CHOLHDL, LDLDIRECT in the last 72 hours. Thyroid Function Tests: No results for input(s): TSH, T4TOTAL, FREET4, T3FREE, THYROIDAB in the last 72 hours. Anemia Panel: No results for input(s): VITAMINB12, FOLATE, FERRITIN, TIBC, IRON, RETICCTPCT in the last 72 hours. Sepsis Labs: No results for input(s): PROCALCITON, LATICACIDVEN in the last 168 hours.  Recent Results  (from the past 240 hour(s))  Urine Culture     Status: None   Collection Time: 08/27/19 12:11 PM   Specimen: Urine, Clean Catch  Result Value Ref Range Status   Specimen Description   Final    URINE, CLEAN CATCH Performed at [facility], [address] [place], [place] [address]    Special Requests   Final    NONE Performed at [facility], [address], [place], [place] [address]    Culture   Final    NO GROWTH Performed at [facility], [place] [address] [place], [place] [address]    Report Status 08/28/2019 FINAL  Final  SARS CORONAVIRUS 2 (TAT 6-24 HRS) Nasopharyngeal Nasopharyngeal Swab     Status: None   Collection Time: 08/30/19 11:35 AM   Specimen: Nasopharyngeal Swab  Result Value Ref Range Status   SARS Coronavirus 2 NEGATIVE NEGATIVE Final  Comment: (NOTE) SARS-CoV-2 target nucleic acids are NOT DETECTED. The SARS-CoV-2 RNA is generally detectable in upper and lower respiratory specimens during the acute phase of infection. Negative results do not preclude SARS-CoV-2 infection, do not rule out co-infections with other pathogens, and should not be used as the sole basis for treatment or other patient management decisions. Negative results must be combined with clinical observations, patient history, and epidemiological information. The expected result is Negative. Fact Sheet for Patients: SugarRoll.be Fact Sheet for Healthcare Providers: https://www.woods-mathews.com/ This test is not yet approved or cleared by the [place] FDA and  has been authorized for detection and/or diagnosis of SARS-CoV-2 by FDA under an Emergency Use Authorization (EUA). This EUA will remain  in effect (meaning this test can be used) for the duration of the COVID-19 declaration under Section 56 4(b)(1) of the Act, 21 U.S.C. section 360bbb-3(b)(1), unless the authorization is terminated or revoked sooner. Performed at  [facility], [name] [address], [place], [place] [address]   KOH prep     Status: None   Collection Time: 09/01/19 10:10 AM   Specimen: Esophagus  Result Value Ref Range Status   Specimen Description ESOPHAGUS  Final   Special Requests NONE  Final   KOH Prep   Final    BUDDING YEAST SEEN Performed at [facility], [address] [place], [place] [address]    Report Status 09/01/2019 FINAL  Final  Respiratory Panel by RT PCR (Flu A&B, Covid) - Nasopharyngeal Swab     Status: None   Collection Time: 09/05/19 12:47 AM   Specimen: Nasopharyngeal Swab  Result Value Ref Range Status   SARS Coronavirus 2 by RT PCR NEGATIVE NEGATIVE Final    Comment: (NOTE) SARS-CoV-2 target nucleic acids are NOT DETECTED. The SARS-CoV-2 RNA is generally detectable in upper respiratoy specimens during the acute phase of infection. The lowest concentration of SARS-CoV-2 viral copies this assay can detect is 131 copies/mL. A negative result does not preclude SARS-Cov-2 infection and should not be used as the sole basis for treatment or other patient management decisions. A negative result may occur with  improper specimen collection/handling, submission of specimen other than nasopharyngeal swab, presence of viral mutation(s) within the areas targeted by this assay, and inadequate number of viral copies (<131 copies/mL). A negative result must be combined with clinical observations, patient history, and epidemiological information. The expected result is Negative. Fact Sheet for Patients:  PinkCheek.be Fact Sheet for Healthcare Providers:  GravelBags.it This test is not yet ap proved or cleared by the [place] FDA and  has been authorized for detection and/or diagnosis of SARS-CoV-2 by FDA under an Emergency Use Authorization (EUA). This EUA will remain  in effect (meaning this test can be used) for the duration of  the COVID-19 declaration under Section 564(b)(1) of the Act, 21 U.S.C. section 360bbb-3(b)(1), unless the authorization is terminated or revoked sooner.    Influenza A by PCR NEGATIVE NEGATIVE Final   Influenza B by PCR NEGATIVE NEGATIVE Final    Comment: (NOTE) The Xpert Xpress SARS-CoV-2/FLU/RSV assay is intended as an aid in  the diagnosis of influenza from Nasopharyngeal swab specimens and  should not be used as a sole basis for treatment. Nasal washings and  aspirates are unacceptable for Xpert Xpress SARS-CoV-2/FLU/RSV  testing. Fact Sheet for Patients: PinkCheek.be Fact Sheet for Healthcare Providers: GravelBags.it This test is not yet approved or cleared by the [place] FDA and  has been authorized for detection  and/or diagnosis of SARS-CoV-2 by  FDA under an Emergency Use Authorization (EUA). This EUA will remain  in effect (meaning this test can be used) for the duration of the  Covid-19 declaration under Section 564(b)(1) of the Act, 21  U.S.C. section 360bbb-3(b)(1), unless the authorization is  terminated or revoked. Performed at [facility], [address] [place], [place] [address]   MRSA PCR Screening     Status: None   Collection Time: 09/05/19  3:35 AM   Specimen: Nasal Mucosa; Nasopharyngeal  Result Value Ref Range Status   MRSA by PCR NEGATIVE NEGATIVE Final    Comment:        The GeneXpert MRSA Assay (FDA approved for NASAL specimens only), is one component of a comprehensive MRSA colonization surveillance program. It is not intended to diagnose MRSA infection nor to guide or monitor treatment for MRSA infections. Performed at [facility], [address], [place], [place] [address]          Radiology Studies: No results found.      Scheduled Meds: . ALPRAZolam  0.25 mg Oral Once  . Chlorhexidine Gluconate Cloth  6 each Topical Daily  . FLUoxetine  10 mg  Oral Daily  . insulin aspart  0-15 Units Subcutaneous TID WC  . insulin glargine  10 Units Subcutaneous QHS  . levothyroxine  50 mcg Oral Q T,Th,S,Su  . [START ON 09/06/2019] levothyroxine  75 mcg Oral Q M,W,F  . loratadine  10 mg Oral Daily  . magnesium oxide  200 mg Oral Daily  . mexiletine  200 mg Oral Q12H  . [START ON 09/08/2019] pantoprazole  40 mg Intravenous Q12H  . simvastatin  20 mg Oral QHS  . torsemide  40 mg Oral Daily  . traZODone  50 mg Oral QHS   Continuous Infusions: . sodium chloride    . sodium chloride    . pantoprozole (PROTONIX) infusion 8 mg/hr (09/05/19 0641)     LOS: 1 day    Time spent: 35 minutes    Sharen Hones, MD [facility]   To contact the attending provider between 7A-7P or the covering provider during after hours 7P-7A, please log into the web site [email or web address] and access using universal [place] password for that web site. If you do not have the password, please call the hospital operator.  09/05/2019, 7:49 AM

## 2019-09-06 DIAGNOSIS — K922 Gastrointestinal hemorrhage, unspecified: Secondary | ICD-10-CM

## 2019-09-06 DIAGNOSIS — I2510 Atherosclerotic heart disease of native coronary artery without angina pectoris: Secondary | ICD-10-CM

## 2019-09-06 DIAGNOSIS — J9611 Chronic respiratory failure with hypoxia: Secondary | ICD-10-CM

## 2019-09-06 LAB — CBC WITH DIFFERENTIAL/PLATELET
Abs Immature Granulocytes: 0.05 10*3/uL (ref 0.00–0.07)
Basophils Absolute: 0 10*3/uL (ref 0.0–0.1)
Basophils Relative: 1 %
Eosinophils Absolute: 0.1 10*3/uL (ref 0.0–0.5)
Eosinophils Relative: 1 %
HCT: 23.3 % — ABNORMAL LOW (ref 36.0–46.0)
Hemoglobin: 7.6 g/dL — ABNORMAL LOW (ref 12.0–15.0)
Immature Granulocytes: 1 %
Lymphocytes Relative: 23 %
Lymphs Abs: 1.3 10*3/uL (ref 0.7–4.0)
MCH: 30.4 pg (ref 26.0–34.0)
MCHC: 32.6 g/dL (ref 30.0–36.0)
MCV: 93.2 fL (ref 80.0–100.0)
Monocytes Absolute: 0.5 10*3/uL (ref 0.1–1.0)
Monocytes Relative: 9 %
Neutro Abs: 3.7 10*3/uL (ref 1.7–7.7)
Neutrophils Relative %: 65 %
Platelets: 124 10*3/uL — ABNORMAL LOW (ref 150–400)
RBC: 2.5 MIL/uL — ABNORMAL LOW (ref 3.87–5.11)
RDW: 19.1 % — ABNORMAL HIGH (ref 11.5–15.5)
WBC: 5.8 10*3/uL (ref 4.0–10.5)
nRBC: 0 % (ref 0.0–0.2)

## 2019-09-06 LAB — GLUCOSE, CAPILLARY
Glucose-Capillary: 83 mg/dL (ref 70–99)
Glucose-Capillary: 65 mg/dL — ABNORMAL LOW (ref 70–99)
Glucose-Capillary: 68 mg/dL — ABNORMAL LOW (ref 70–99)
Glucose-Capillary: 96 mg/dL (ref 70–99)
Glucose-Capillary: 93 mg/dL (ref 70–99)
Glucose-Capillary: 106 mg/dL — ABNORMAL HIGH (ref 70–99)

## 2019-09-06 LAB — BASIC METABOLIC PANEL
Anion gap: 9 (ref 5–15)
BUN: 35 mg/dL — ABNORMAL HIGH (ref 8–23)
CO2: 27 mmol/L (ref 22–32)
Calcium: 8.3 mg/dL — ABNORMAL LOW (ref 8.9–10.3)
Chloride: 102 mmol/L (ref 98–111)
Creatinine, Ser: 2.14 mg/dL — ABNORMAL HIGH (ref 0.44–1.00)
GFR calc Af Amer: 25 mL/min — ABNORMAL LOW (ref >60)
GFR calc non Af Amer: 22 mL/min — ABNORMAL LOW (ref >60)
Glucose, Bld: 80 mg/dL (ref 70–99)
Potassium: 3.8 mmol/L (ref 3.5–5.1)
Sodium: 138 mmol/L (ref 135–145)

## 2019-09-06 LAB — HEMOGLOBIN
Hemoglobin: 8.1 g/dL — ABNORMAL LOW (ref 12.0–15.0)
Hemoglobin: 9.6 g/dL — ABNORMAL LOW (ref 12.0–15.0)

## 2019-09-06 LAB — HEMOGLOBIN A1C
Hgb A1c MFr Bld: 5.5 % (ref 4.8–5.6)
Mean Plasma Glucose: 111.15 mg/dL

## 2019-09-06 LAB — PREPARE RBC (CROSSMATCH)

## 2019-09-06 LAB — HOMOCYSTEINE: Homocysteine: 10.4 umol/L (ref 0.0–19.2)

## 2019-09-06 LAB — MAGNESIUM: Magnesium: 2.4 mg/dL (ref 1.7–2.4)

## 2019-09-06 MED ORDER — FUROSEMIDE 10 MG/ML IJ SOLN
20.0000 mg | Freq: Once | INTRAMUSCULAR | Status: AC
  Administered 2019-09-06: 20 mg via INTRAVENOUS
  Filled 2019-09-06: qty 2

## 2019-09-06 MED ORDER — INSULIN ASPART 100 UNIT/ML ~~LOC~~ SOLN: 0.0000 [IU] | Freq: Every day | SUBCUTANEOUS | Status: DC

## 2019-09-06 MED ORDER — ROPINIROLE HCL 0.25 MG PO TABS
0.2500 mg | ORAL_TABLET | Freq: Every day | ORAL | Status: DC
  Administered 2019-09-07 – 2019-09-08 (×2): 0.25 mg via ORAL
  Filled 2019-09-06 (×5): qty 1

## 2019-09-06 MED ORDER — INSULIN GLARGINE 100 UNIT/ML ~~LOC~~ SOLN
6.0000 [IU] | Freq: Every day | SUBCUTANEOUS | Status: DC
  Administered 2019-09-06: 6 [IU] via SUBCUTANEOUS
  Filled 2019-09-06 (×2): qty 0.06

## 2019-09-06 MED ORDER — INSULIN ASPART 100 UNIT/ML ~~LOC~~ SOLN: 0.0000 [IU] | Freq: Three times a day (TID) | SUBCUTANEOUS | Status: DC

## 2019-09-06 MED ORDER — SODIUM CHLORIDE 0.9% IV SOLUTION: Freq: Once | INTRAVENOUS | Status: AC

## 2019-09-06 NOTE — Progress Notes (Signed)
OVERNIGHT Patient third on Requip for symptoms of restless leg syndrome after no improvement with self Xanax or nighttime trazodone.

## 2019-09-06 NOTE — TOC Initial Note (Signed)
Transition of Care [facility]) - Initial/Assessment Note    Patient Details  Name: Heather Peterson MRN: [id number] Date of Birth: [birth year]  [facility] CM/SW Contact:    Candie Chroman, LCSW Phone Number: 09/06/2019, 12:46 PM  Clinical Narrative:  Readmission prevention screen complete. CSW met with patient. No supports at bedside. CSW introduced role and explained that discharge planning would be discussed. Patient's PCP is Dr. Ellison Hughs. Husband takes her to appts. Pharmacy is [facility] on [address] in [place]. She reports that some of her medications are expensive. Patient was active with [facility] for home health services prior to admission. She does not use equipment to get around at home. She is on oxygen at home which she gets through [facility]. No further concerns. CSW encouraged patient to contact CSW as needed. CSW will continue to follow patient for support and facilitate return home when stable.                Expected Discharge Plan: [place] Barriers to Discharge: Continued Medical Work up   Patient Goals and CMS Choice     Choice offered to / list presented to : NA  Expected Discharge Plan and Services Expected Discharge Plan: [place] Acute Care Choice: Resumption of Svcs/PTA Provider Living arrangements for the past 2 months: Single Family Home                                      Prior Living Arrangements/Services Living arrangements for the past 2 months: Single Family Home Lives with:: Spouse Patient language and need for interpreter reviewed:: Yes Do you feel safe going back to the place where you live?: Yes      Need for Family Participation in Patient Care: Yes (Comment) Care giver support system in place?: Yes (comment) Current home services: DME, Home OT, Home PT, Home RN Criminal Activity/Legal Involvement Pertinent to Current Situation/Hospitalization: No - Comment as  needed  Activities of Daily Living      Permission Sought/Granted Permission sought to share information with : Facility Art therapist granted to share information with : Yes, Verbal Permission Granted     Permission granted to share info w AGENCY: [facility]        Emotional Assessment Appearance:: Appears stated age Attitude/Demeanor/Rapport: Engaged, Gracious Affect (typically observed): Accepting, Appropriate, Calm, Pleasant Orientation: : Oriented to Self, Oriented to Place, Oriented to  Time, Oriented to Situation Alcohol / Substance Use: Not Applicable Psych Involvement: No (comment)  Admission diagnosis:  Acute blood loss anemia [D62] UGI bleed [K92.2] Gastrointestinal hemorrhage, unspecified gastrointestinal hemorrhage type [K92.2] Patient Active Problem List   Diagnosis Date Noted  . Acute blood loss anemia   . Gastrointestinal hemorrhage   . UGI bleed 09/04/2019  . Dehydration   . Anemia in chronic kidney disease (CKD) 07/04/2019  . CKD (chronic kidney disease), stage III 07/04/2019  . Chronic respiratory failure with hypoxia ([place]) 07/03/2019  . Hyponatremia 07/03/2019  . Prolonged Q-T interval on ECG   . Weakness   . Anemia   . MDD (major depressive disorder), recurrent episode, mild ([place]) 12/14/2018  . Tobacco use disorder 12/14/2018  . At risk for prolonged QT interval syndrome 12/14/2018  . 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 Pedal edema 02/19/2018  . SOB (shortness of breath) on exertion 02/19/2018  . Left  main coronary artery disease 03/07/2017  . Drug-induced torsades de pointes 03/06/2017  . Ventricular tachycardia ([place]) 03/05/2017  . Defibrillator discharge 03/04/2017  . CHF (congestive heart failure), NYHA class III, chronic, combined ([place]) 03/04/2017  . COPD (chronic obstructive pulmonary disease) ([place]) 02/10/2017  . Diabetes mellitus type 2, uncomplicated ([place]) 38/02/1750  . H/O ventricular fibrillation  02/10/2017  . Hyperlipidemia 02/10/2017  . 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 Ischemic cardiomyopathy 02/10/2017  . Myocardial infarction ([place]) 02/10/2017  . Moderate mitral regurgitation 02/10/2017  . Syncope and collapse 02/04/2017  . CAD (coronary artery disease) 02/04/2017  . HTN (hypertension) 02/04/2017  . Diabetes ([place]) 02/04/2017   PCP:  [name], MD Pharmacy:   [facility] [phone] - [address], [address] [place] [address] Phone: [phone] Fax: [phone]     Social Determinants of Health (SDOH) Interventions    Readmission Risk Interventions Readmission Risk Prevention Plan 09/06/2019  Transportation Screening Complete  Medication Review ([place]) Complete  PCP or Specialist appointment within 3-5 days of discharge Complete  HRI or [place] Complete  SW Recovery Care/Counseling Consult Complete  [place] Not Applicable  Some recent data might be hidden

## 2019-09-06 NOTE — Progress Notes (Signed)
PROGRESS NOTE  Heather Peterson YKZ:[id number] DOB: [birth year]   PCP: [name], MD  Patient is from: Home. Independently ambulates at baseline.  DOA: 09/04/2019 LOS: 2  Brief Narrative / Interim history: 79 year old female with history of CAD s/p stent in 2015, combined CHF, CKD-4, DM-2, hypothyroidism, gastritis, diverticulosis and GI bleed presenting with hematemesis and hematochezia. Patient had EGD and colonoscopy on 4/21. EGD revealed esophageal plaques (biopsied) concerning for candidiasis, eosinophilic esophagitis, gastritis and moderate portal hypertensive gastropathy. Colonoscopy on 4/21 with severe sigmoid diverticulosis, nonbleeding internal hemorrhoids and polyps in descending and transverse colon that were resected and retrieved. Patient was resumed on Plavix and discharged home.  On admission, Hgb 6.6 (baseline 9-10). Transfused 2 units with minimal response. Was also hypotensive. Started on Protonix drip. GI consulted to evaluate and admitted.  Subjective: Seen and examined earlier this morning. No major events overnight or this morning. Reports dark bloody stool yesterday evening. Has not a bowel movement overnight. No other complaints. She denies chest pain, dyspnea, nausea, vomiting or abdominal pain.  Objective: Vitals:   09/06/19 0800 09/06/19 0900 09/06/19 1000 09/06/19 1130  BP: (!) 85/41 (!) 103/57 97/68 108/65  Pulse: 74 79 76 75  Resp: 10 13 14 18   Temp: 97.8 F (36.6 C)   98.2 F (36.8 C)  TempSrc: Oral   Oral  SpO2: 100% 90% 95% 100%  Weight:      Height:        Intake/Output Summary (Last 24 hours) at 09/06/2019 1235 Last data filed at 09/06/2019 1109 Gross per 24 hour  Intake 1167.89 ml  Output 1050 ml  Net 117.89 ml   Filed Weights   09/04/19 1755  Weight: 86.6 kg    Examination:  GENERAL: No apparent distress. Nontoxic.  HEENT: MMM.  Vision and hearing grossly intact.  NECK: Supple.  No apparent JVD.  RESP: On 2 L by [place]. No  IWOB. Good air movement bilaterally. CVS:  RRR. Heart sounds normal.  ABD/GI/GU: BS present. Soft. Non tender.  MSK/EXT:  Moves extremities. No apparent deformity. No edema.  SKIN: no apparent skin lesion or wound NEURO: Awake, alert and oriented appropriately.  No apparent focal neuro deficit. PSYCH: Calm. Normal affect.   Procedures:  None  Microbiology summarized: COVID-19 PCR negative. Influenza PCR negative. MRSA PCR negative.  Assessment & Plan: ABLA due to GIB: Concern about upper GI bleed. Presented with hematemesis and hematochezia with fresh and dark clots. Patient is on aspirin and Plavix. EGD and colonoscopy on 4/16 as above. -Baseline Hgb 9-10>> 6.6 (admit)> 2 units> 7.4> 6.8> 1 unit> 8.1> 7.6. Anemia panel on 4/16 normal. -Transfuse to keep Hgb above 8.0 given history of CAD. -Continue monitoring H&H -Continue holding Plavix. Will take place with a cardiologist if there is still indication -Appreciate GI input.  Chronic combined CHF: Appears euvolemic. On torsemide 40 mg daily at home. -Continue home torsemide. Will give IV Lasix 20 mg once for blood transfusion. -Monitor fluid status  Chronic respiratory failure on 2 L at baseline. Stable.  Chronic COPD: Stable. -As needed albuterol  History of CAD s/p stent in 2015 -Plavix and aspirin on hold in the setting of GI bleed-will clarify indications with a cardiologist. -Continue home statin. Does she take Ranexa?  Hypotension: Hypotensive to 80s/40s overnight but not symptomatic. Normotensive this morning.  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 -Continue SSI-moderate. Add nightly coverage. -Check hemoglobin A1c -Continue statin.  CKD-4: Stable. -Continue  monitoring  Hypothyroidism -Continue home Synthroid.  History of depression: Stable. -Continue home Prozac                 DVT prophylaxis: SCD in the setting of GI bleed. Code Status: Full code Family Communication: Patient and/or RN. Available if any question.   Discharge barrier: GI bleed requiring blood transfusion and close monitoring. Patient is from: Home. Final disposition: Likely home in the next 48 to 72 hours if H&H stable and cleared by consultants.  Consultants:  Gastroenterology   Sch Meds:  Scheduled Meds: . sodium chloride   Intravenous Once  . chlorhexidine  15 mL Mouth Rinse BID  . Chlorhexidine Gluconate Cloth  6 each Topical Daily  . FLUoxetine  10 mg Oral Daily  . insulin aspart  0-15 Units Subcutaneous TID WC  . insulin glargine  10 Units Subcutaneous QHS  . levothyroxine  50 mcg Oral Q T,Th,S,Su  . levothyroxine  75 mcg Oral Q M,W,F  . loratadine  10 mg Oral Daily  . magnesium oxide  200 mg Oral Daily  . mouth rinse  15 mL Mouth Rinse q12n4p  . mexiletine  200 mg Oral Q12H  . [START ON 09/08/2019] pantoprazole  40 mg Intravenous Q12H  . rOPINIRole  0.25 mg Oral QHS  . simvastatin  20 mg Oral QHS  . torsemide  40 mg Oral Daily   Continuous Infusions: . sodium chloride    . sodium chloride    . pantoprozole (PROTONIX) infusion 8 mg/hr (09/06/19 0400)   PRN Meds:.acetaminophen, albuterol, sodium chloride  Antimicrobials: Anti-infectives (From admission, onward)   None       I have personally reviewed the following labs and images: CBC: Recent Labs  Lab 09/04/19 1800 09/04/19 1800 09/05/19 0047 09/05/19 0047 09/05/19 0709 09/05/19 1336 09/05/19 2242 09/06/19 0219 09/06/19 0823  WBC 8.7  --   --   --   --   --   --   --  5.8  NEUTROABS 5.4  --   --   --   --   --   --   --  3.7  HGB 6.6*   < > 7.4*   < > 6.8* 7.1* 7.4* 8.1* 7.6*  HCT 20.5*  --  22.2*  --   --   --   --   --  23.3*  MCV 95.8  --   --   --   --   --   --    --  93.2  PLT 199  --   --   --   --   --   --   --  124*   < > = values in this interval not displayed.   BMP &GFR Recent Labs  Lab 09/04/19 1800 09/05/19 0709 09/06/19 0823  NA 136 138 138  K 4.3 4.2 3.8  CL 97* 98 102  CO2 30 30 27   GLUCOSE 106* 104* 80  BUN 37* 36* 35*  CREATININE 2.50* 2.36* 2.14*  CALCIUM 8.4* 8.4* 8.3*  MG  --   --  2.4   Estimated Creatinine Clearance: 23.5 mL/min (A) (by C-G formula based on SCr of 2.14 mg/dL (H)). Liver & Pancreas: Recent Labs  Lab 09/04/19 1800  AST 19  ALT 10  ALKPHOS 67  BILITOT 1.0  PROT 6.6  ALBUMIN 3.1*   Recent Labs  Lab 09/04/19 1800  LIPASE 25   No results for input(s): AMMONIA in the last 168 hours. Diabetic: No results for input(s): HGBA1C in the last 72 hours. Recent Labs  Lab 09/05/19 1553 09/05/19 2133 09/06/19 0743 09/06/19 1148 09/06/19 1204  GLUCAP 110* 96 83 68* 65*   Cardiac Enzymes: No results for input(s): CKTOTAL, CKMB, CKMBINDEX, TROPONINI in the last 168 hours. No results for input(s): PROBNP in the last 8760 hours. Coagulation Profile: Recent Labs  Lab 09/04/19 1800  INR 1.4*   Thyroid Function Tests: No results for input(s): TSH, T4TOTAL, FREET4, T3FREE, THYROIDAB in the last 72 hours. Lipid Profile: No results for input(s): CHOL, HDL, LDLCALC, TRIG, CHOLHDL, LDLDIRECT in the last 72 hours. Anemia Panel: No results for input(s): VITAMINB12, FOLATE, FERRITIN, TIBC, IRON, RETICCTPCT in the last 72 hours. Urine analysis:    Component Value Date/Time   COLORURINE YELLOW 09/04/2019 1800   APPEARANCEUR CLEAR 09/04/2019 1800   LABSPEC 1.015 09/04/2019 1800   PHURINE 7.5 09/04/2019 1800   GLUCOSEU NEGATIVE 09/04/2019 1800   HGBUR LARGE (A) 09/04/2019 1800   BILIRUBINUR NEGATIVE 09/04/2019 1800   KETONESUR NEGATIVE 09/04/2019 1800   PROTEINUR 30 (A) 09/04/2019 1800   NITRITE NEGATIVE 09/04/2019 1800   LEUKOCYTESUR SMALL (A) 09/04/2019 1800   Sepsis Labs: Invalid input(s):  PROCALCITONIN, [place]  Microbiology: Recent Results (from the past 240 hour(s))  SARS CORONAVIRUS 2 (TAT 6-24 HRS) Nasopharyngeal Nasopharyngeal Swab     Status: None   Collection Time: 08/30/19 11:35 AM   Specimen: Nasopharyngeal Swab  Result Value Ref Range Status   SARS Coronavirus 2 NEGATIVE NEGATIVE Final    Comment: (NOTE) SARS-CoV-2 target nucleic acids are NOT DETECTED. The SARS-CoV-2 RNA is generally detectable in upper and lower respiratory specimens during the acute phase of infection. Negative results do not preclude SARS-CoV-2 infection, do not rule out co-infections with other pathogens, and should not be used as the sole basis for treatment or other patient management decisions. Negative results must be combined with clinical observations, patient history, and epidemiological information. The expected result is Negative. Fact Sheet for Patients: SugarRoll.be Fact Sheet for Healthcare Providers: https://www.woods-mathews.com/ This test is not yet approved or cleared by the [place] FDA and  has been authorized for detection and/or diagnosis of SARS-CoV-2 by FDA under an Emergency Use Authorization (EUA). This EUA will remain  in effect (meaning this test can be used) for the duration of the COVID-19 declaration under Section 56 4(b)(1) of the Act, 21 U.S.C. section 360bbb-3(b)(1), unless the authorization is terminated or revoked sooner. Performed at [facility], [place] [address] [place], [place] [address]   KOH prep     Status: None   Collection Time: 09/01/19 10:10 AM   Specimen: Esophagus  Result Value Ref Range Status   Specimen Description ESOPHAGUS  Final   Special Requests NONE  Final   KOH Prep   Final    BUDDING YEAST SEEN Performed at [facility], [address] [place],  [address]    Report Status 09/01/2019 FINAL  Final  Respiratory Panel by RT PCR (Flu A&B, Covid) -  Nasopharyngeal Swab     Status: None   Collection Time: 09/05/19 12:47 AM   Specimen: Nasopharyngeal Swab  Result Value Ref Range Status   SARS Coronavirus 2 by RT PCR NEGATIVE NEGATIVE Final    Comment: (NOTE) SARS-CoV-2 target nucleic acids  are NOT DETECTED. The SARS-CoV-2 RNA is generally detectable in upper respiratoy specimens during the acute phase of infection. The lowest concentration of SARS-CoV-2 viral copies this assay can detect is 131 copies/mL. A negative result does not preclude SARS-Cov-2 infection and should not be used as the sole basis for treatment or other patient management decisions. A negative result may occur with  improper specimen collection/handling, submission of specimen other than nasopharyngeal swab, presence of viral mutation(s) within the areas targeted by this assay, and inadequate number of viral copies (<131 copies/mL). A negative result must be combined with clinical observations, patient history, and epidemiological information. The expected result is Negative. Fact Sheet for Patients:  PinkCheek.be Fact Sheet for Healthcare Providers:  GravelBags.it This test is not yet ap proved or cleared by the [place] FDA and  has been authorized for detection and/or diagnosis of SARS-CoV-2 by FDA under an Emergency Use Authorization (EUA). This EUA will remain  in effect (meaning this test can be used) for the duration of the COVID-19 declaration under Section 564(b)(1) of the Act, 21 U.S.C. section 360bbb-3(b)(1), unless the authorization is terminated or revoked sooner.    Influenza A by PCR NEGATIVE NEGATIVE Final   Influenza B by PCR NEGATIVE NEGATIVE Final    Comment: (NOTE) The Xpert Xpress SARS-CoV-2/FLU/RSV assay is intended as an aid in  the diagnosis of influenza from Nasopharyngeal swab specimens and  should not be used as a sole basis for treatment. Nasal washings and  aspirates  are unacceptable for Xpert Xpress SARS-CoV-2/FLU/RSV  testing. Fact Sheet for Patients: PinkCheek.be Fact Sheet for Healthcare Providers: GravelBags.it This test is not yet approved or cleared by the [place] FDA and  has been authorized for detection and/or diagnosis of SARS-CoV-2 by  FDA under an Emergency Use Authorization (EUA). This EUA will remain  in effect (meaning this test can be used) for the duration of the  Covid-19 declaration under Section 564(b)(1) of the Act, 21  U.S.C. section 360bbb-3(b)(1), unless the authorization is  terminated or revoked. Performed at [facility], [address] [place], [place] [address]   MRSA PCR Screening     Status: None   Collection Time: 09/05/19  3:35 AM   Specimen: Nasal Mucosa; Nasopharyngeal  Result Value Ref Range Status   MRSA by PCR NEGATIVE NEGATIVE Final    Comment:        The GeneXpert MRSA Assay (FDA approved for NASAL specimens only), is one component of a comprehensive MRSA colonization surveillance program. It is not intended to diagnose MRSA infection nor to guide or monitor treatment for MRSA infections. Performed at [facility], [address] [place], [place] [address]     Radiology Studies: No results found.     Mckenzy Salazar T. [place]  If 7PM-7AM, please contact night-coverage [email or web address] Password [facility] 09/06/2019, 12:35 PM

## 2019-09-06 NOTE — Progress Notes (Signed)
[name] , MD [address] [place], [place], [place], [address] [address], [place], [place], [place], [address] Phone: [phone]  Fax: [phone]   Heather Peterson is being followed for GI bleed day 1 of follow up   Subjective: Doing well no further complaints.  Has had some black stool.  Denies any hematemesis.  Objective: Vital signs in last 24 hours: Vitals:   09/06/19 0800 09/06/19 0900 09/06/19 1000 09/06/19 1130  BP: (!) 85/41 (!) 103/57 97/68 108/65  Pulse: 74 79 76 75  Resp: 10 13 14 18   Temp: 97.8 F (36.6 C)   98.2 F (36.8 C)  TempSrc: Oral   Oral  SpO2: 100% 90% 95% 100%  Weight:      Height:       Weight change:   Intake/Output Summary (Last 24 hours) at 09/06/2019 1242 Last data filed at 09/06/2019 1109 Gross per 24 hour  Intake 1167.89 ml  Output 1050 ml  Net 117.89 ml     Exam:  Abdomen: soft, nontender, normal bowel sounds   Lab Results: @LABTEST2 @ Micro Results: Recent Results (from the past 240 hour(s))  SARS CORONAVIRUS 2 (TAT 6-24 HRS) Nasopharyngeal Nasopharyngeal Swab     Status: None   Collection Time: 08/30/19 11:35 AM   Specimen: Nasopharyngeal Swab  Result Value Ref Range Status   SARS Coronavirus 2 NEGATIVE NEGATIVE Final    Comment: (NOTE) SARS-CoV-2 target nucleic acids are NOT DETECTED. The SARS-CoV-2 RNA is generally detectable in upper and lower respiratory specimens during the acute phase of infection. Negative results do not preclude SARS-CoV-2 infection, do not rule out co-infections with other pathogens, and should not be used as the sole basis for treatment or other patient management decisions. Negative results must be combined with clinical observations, patient history, and epidemiological information. The expected result is Negative. Fact Sheet for Patients: SugarRoll.be Fact Sheet for Healthcare Providers: https://www.woods-mathews.com/ This test is  not yet approved or cleared by the [place] FDA and  has been authorized for detection and/or diagnosis of SARS-CoV-2 by FDA under an Emergency Use Authorization (EUA). This EUA will remain  in effect (meaning this test can be used) for the duration of the COVID-19 declaration under Section 56 4(b)(1) of the Act, 21 U.S.C. section 360bbb-3(b)(1), unless the authorization is terminated or revoked sooner. Performed at [facility], [place] [address] [place], [place] [address]   KOH prep     Status: None   Collection Time: 09/01/19 10:10 AM   Specimen: Esophagus  Result Value Ref Range Status   Specimen Description ESOPHAGUS  Final   Special Requests NONE  Final   KOH Prep   Final    BUDDING YEAST SEEN Performed at [facility], [address] [place],  [address]    Report Status 09/01/2019 FINAL  Final  Respiratory Panel by RT PCR (Flu A&B, Covid) - Nasopharyngeal Swab     Status: None   Collection Time: 09/05/19 12:47 AM   Specimen: Nasopharyngeal Swab  Result Value Ref Range Status   SARS Coronavirus 2 by RT PCR NEGATIVE NEGATIVE Final    Comment: (NOTE) SARS-CoV-2 target nucleic acids are NOT DETECTED. The SARS-CoV-2 RNA is generally detectable in upper respiratoy specimens during the acute phase of infection. The lowest concentration of SARS-CoV-2 viral copies this assay can detect is 131 copies/mL. A negative result does not preclude SARS-Cov-2 infection and should not be used as the sole basis for treatment or other  patient management decisions. A negative result may occur with  improper specimen collection/handling, submission of specimen other than nasopharyngeal swab, presence of viral mutation(s) within the areas targeted by this assay, and inadequate number of viral copies (<131 copies/mL). A negative result must be combined with clinical observations, patient history, and epidemiological information. The expected result is Negative. Fact  Sheet for Patients:  PinkCheek.be Fact Sheet for Healthcare Providers:  GravelBags.it This test is not yet ap proved or cleared by the [place] FDA and  has been authorized for detection and/or diagnosis of SARS-CoV-2 by FDA under an Emergency Use Authorization (EUA). This EUA will remain  in effect (meaning this test can be used) for the duration of the COVID-19 declaration under Section 564(b)(1) of the Act, 21 U.S.C. section 360bbb-3(b)(1), unless the authorization is terminated or revoked sooner.    Influenza A by PCR NEGATIVE NEGATIVE Final   Influenza B by PCR NEGATIVE NEGATIVE Final    Comment: (NOTE) The Xpert Xpress SARS-CoV-2/FLU/RSV assay is intended as an aid in  the diagnosis of influenza from Nasopharyngeal swab specimens and  should not be used as a sole basis for treatment. Nasal washings and  aspirates are unacceptable for Xpert Xpress SARS-CoV-2/FLU/RSV  testing. Fact Sheet for Patients: PinkCheek.be Fact Sheet for Healthcare Providers: GravelBags.it This test is not yet approved or cleared by the [place] FDA and  has been authorized for detection and/or diagnosis of SARS-CoV-2 by  FDA under an Emergency Use Authorization (EUA). This EUA will remain  in effect (meaning this test can be used) for the duration of the  Covid-19 declaration under Section 564(b)(1) of the Act, 21  U.S.C. section 360bbb-3(b)(1), unless the authorization is  terminated or revoked. Performed at [facility], [address] [place], [place] [address]   MRSA PCR Screening     Status: None   Collection Time: 09/05/19  3:35 AM   Specimen: Nasal Mucosa; Nasopharyngeal  Result Value Ref Range Status   MRSA by PCR NEGATIVE NEGATIVE Final    Comment:        The GeneXpert MRSA Assay (FDA approved for NASAL specimens only), is one component of  a comprehensive MRSA colonization surveillance program. It is not intended to diagnose MRSA infection nor to guide or monitor treatment for MRSA infections. Performed at [facility], [address] [place], [place] [address]    Studies/Results: No results found. Medications: I have reviewed the patient's current medications. Scheduled Meds: . sodium chloride   Intravenous Once  . chlorhexidine  15 mL Mouth Rinse BID  . Chlorhexidine Gluconate Cloth  6 each Topical Daily  . FLUoxetine  10 mg Oral Daily  . insulin aspart  0-15 Units Subcutaneous TID WC  . insulin glargine  10 Units Subcutaneous QHS  . levothyroxine  50 mcg Oral Q T,Th,S,Su  . levothyroxine  75 mcg Oral Q M,W,F  . loratadine  10 mg Oral Daily  . magnesium oxide  200 mg Oral Daily  . mouth rinse  15 mL Mouth Rinse q12n4p  . mexiletine  200 mg Oral Q12H  . [START ON 09/08/2019] pantoprazole  40 mg Intravenous Q12H  . rOPINIRole  0.25 mg Oral QHS  . simvastatin  20 mg Oral QHS  . torsemide  40 mg Oral Daily   Continuous Infusions: . sodium chloride    . sodium chloride    . pantoprozole (PROTONIX) infusion 8 mg/hr (09/06/19 0400)   PRN Meds:.acetaminophen, albuterol, sodium chloride   Assessment:  Active Problems:   HTN (hypertension)   Diabetes (HCC)   CHF (congestive heart failure), NYHA class III, chronic, combined (HCC)   COPD (chronic obstructive pulmonary disease) (HCC)   Diabetes mellitus type 2, uncomplicated (HCC)   CKD (chronic kidney disease), stage III   UGI bleed   Acute blood loss anemia   Gastrointestinal hemorrhage  Heather Peterson /age female history of anemia and GI bleeding presented with hematemesis after having an EGD and colonoscopy few days back.  Patient restarted her Plavix and had melena likely from upper GI bleed she has had hematemesis.  Endoscopy has been held at this point of time since she has been on Plavix recently. Likely bleeding from portal hypertensive  gastropathy Plan: 1.  Plan for EGD when off Plavix for at least 3 days.  In the interim if has further bleeding to obtain bleeding scan.  We will plan for procedure on Wednesday   LOS: 2 days   [name], MD 09/06/2019, 12:42 PM

## 2019-09-07 DIAGNOSIS — K922 Gastrointestinal hemorrhage, unspecified: Secondary | ICD-10-CM | POA: Diagnosis not present

## 2019-09-07 LAB — TYPE AND SCREEN
ABO/RH(D): B POS
Antibody Screen: NEGATIVE
Unit division: 0
Unit division: 0
Unit division: 0
Unit division: 0

## 2019-09-07 LAB — RENAL FUNCTION PANEL
Albumin: 3.2 g/dL — ABNORMAL LOW (ref 3.5–5.0)
Anion gap: 9 (ref 5–15)
BUN: 33 mg/dL — ABNORMAL HIGH (ref 8–23)
CO2: 30 mmol/L (ref 22–32)
Calcium: 8.5 mg/dL — ABNORMAL LOW (ref 8.9–10.3)
Chloride: 102 mmol/L (ref 98–111)
Creatinine, Ser: 2.13 mg/dL — ABNORMAL HIGH (ref 0.44–1.00)
GFR calc Af Amer: 25 mL/min — ABNORMAL LOW (ref >60)
GFR calc non Af Amer: 22 mL/min — ABNORMAL LOW (ref >60)
Glucose, Bld: 86 mg/dL (ref 70–99)
Phosphorus: 3.5 mg/dL (ref 2.5–4.6)
Potassium: 3.5 mmol/L (ref 3.5–5.1)
Sodium: 141 mmol/L (ref 135–145)

## 2019-09-07 LAB — BPAM RBC
Blood Product Expiration Date: 202105062359
Blood Product Expiration Date: 202105222359
Blood Product Expiration Date: 202105252359
Blood Product Expiration Date: 202105262359
ISSUE DATE / TIME: 202104241912
ISSUE DATE / TIME: 202104241926
ISSUE DATE / TIME: 202104251820
ISSUE DATE / TIME: 202104261316
Unit Type and Rh: 1700
Unit Type and Rh: 5100
Unit Type and Rh: 5100
Unit Type and Rh: 7300

## 2019-09-07 LAB — CBC
HCT: 26.2 % — ABNORMAL LOW (ref 36.0–46.0)
Hemoglobin: 8.7 g/dL — ABNORMAL LOW (ref 12.0–15.0)
MCH: 31 pg (ref 26.0–34.0)
MCHC: 33.2 g/dL (ref 30.0–36.0)
MCV: 93.2 fL (ref 80.0–100.0)
Platelets: 130 10*3/uL — ABNORMAL LOW (ref 150–400)
RBC: 2.81 MIL/uL — ABNORMAL LOW (ref 3.87–5.11)
RDW: 18.3 % — ABNORMAL HIGH (ref 11.5–15.5)
WBC: 5.1 10*3/uL (ref 4.0–10.5)
nRBC: 0.4 % — ABNORMAL HIGH (ref 0.0–0.2)

## 2019-09-07 LAB — GLUCOSE, CAPILLARY
Glucose-Capillary: 70 mg/dL (ref 70–99)
Glucose-Capillary: 85 mg/dL (ref 70–99)
Glucose-Capillary: 157 mg/dL — ABNORMAL HIGH (ref 70–99)

## 2019-09-07 LAB — MAGNESIUM: Magnesium: 2.4 mg/dL (ref 1.7–2.4)

## 2019-09-07 MED ORDER — MIDODRINE HCL 5 MG PO TABS
5.0000 mg | ORAL_TABLET | Freq: Two times a day (BID) | ORAL | Status: DC
  Administered 2019-09-07 – 2019-09-09 (×3): 5 mg via ORAL
  Filled 2019-09-07 (×5): qty 1

## 2019-09-07 MED ORDER — ALPRAZOLAM 0.25 MG PO TABS
0.2500 mg | ORAL_TABLET | Freq: Every day | ORAL | Status: DC
  Administered 2019-09-07 – 2019-09-08 (×3): 0.25 mg via ORAL
  Filled 2019-09-07 (×3): qty 1

## 2019-09-07 NOTE — Progress Notes (Addendum)
PROGRESS NOTE  Heather Peterson YJE:[id number] DOB: March 31, 1941   PCP: [name], MD  Patient is from: Home. Independently ambulates at baseline.  DOA: 09/04/2019 LOS: 3  Brief Narrative / Interim history: 79 year old female with history of CAD s/p stent in 2015, combined CHF, CKD-4, DM-2, hypothyroidism, gastritis, diverticulosis and GI bleed presenting with hematemesis and hematochezia. Patient had EGD and colonoscopy on 4/21. EGD revealed esophageal plaques (biopsied) concerning for candidiasis, eosinophilic esophagitis, gastritis and moderate portal hypertensive gastropathy. Colonoscopy on 4/21 with severe sigmoid diverticulosis, nonbleeding internal hemorrhoids and polyps in descending and transverse colon that were resected and retrieved. Patient was resumed on Plavix and discharged home.  On admission, Hgb 6.6 (baseline 9-10). Transfused 2 units with minimal response. Was also hypotensive. Started on Protonix drip. GI consulted to evaluate and admitted.  GI planning EGD after Plavix washout.  Also recommended bleeding scan if further bleeding in the interim.   Subjective: Seen and examined earlier this morning.  No major events overnight of this morning.  Tired of lying in bed.  She would like to get out of the bed and move around.  She denies chest pain, dyspnea or palpitation.  She is currently on bed pan for BM.   Objective: Vitals:   09/06/19 2057 09/07/19 0222 09/07/19 0224 09/07/19 0410  BP: 105/70 (!) 84/51 (!) 87/56 (!) 98/53  Pulse: 80 76 77 76  Resp: 20 12  18   Temp: 97.9 F (36.6 C) 97.8 F (36.6 C)  98.5 F (36.9 C)  TempSrc: Oral Oral  Oral  SpO2: 98% 100% 100% 100%  Weight:      Height:        Intake/Output Summary (Last 24 hours) at 09/07/2019 1014 Last data filed at 09/07/2019 0839 Gross per 24 hour  Intake 580.93 ml  Output 1725 ml  Net -1144.07 ml   Filed Weights   09/04/19 1755  Weight: 86.6 kg    Examination:  GENERAL: No apparent  distress.  Nontoxic. HEENT: MMM.  Vision and hearing grossly intact.  NECK: Supple.  No apparent JVD.  RESP: On 2 L by [place].  No IWOB.  Fair aeration bilaterally. CVS:  RRR. Heart sounds normal.  ABD/GI/GU: Bowel sounds present. Soft. Non tender.  MSK/EXT:  Moves extremities. No apparent deformity. No edema.  SKIN: no apparent skin lesion or wound NEURO: Awake, alert and oriented appropriately.  No apparent focal neuro deficit. PSYCH: Calm. Normal affect.  Procedures:  None  Microbiology summarized: COVID-19 PCR negative. Influenza PCR negative. MRSA PCR negative.  Assessment & Plan: ABLA due to GIB: Concern about upper GI bleed. Presented with hematemesis and hematochezia with fresh and dark clots. Patient is on aspirin and Plavix. EGD and colonoscopy on 4/16 as above. -B/l Hgb 9-10>> 6.6 (admit)> 2 units> 7.4> 6.8> 1 unit> 8.1> 7.6>1u> 8.7.  -Anemia panel on 4/16 normal. -Transfuse to keep Hgb above 8.0 given history of CAD. -Continue monitoring H&H -Will clarify with a cardiologist if there is still indication for DAPT.  Had a stent in 2015. -Appreciate GI input-repeat EGD after Plavix washout.  Bleeding scan if bleeding in the interim.  Addendum -okay to stop Plavix indefinitely and continuing low-dose aspirin per cardiology, Dr. Clayborn Bigness  Chronic combined CHF: Appears euvolemic. On torsemide 40 mg daily at home.  Had 1.5 L UOP/24 hours.  Appears euvolemic.  Renal function is stable. -Continue home torsemide.  -Monitor fluid status and renal function  Chronic respiratory failure on 2 L at baseline. Stable.  Chronic COPD: Stable. -As needed albuterol  History of CAD s/p stent in 2015 -Plavix and aspirin on hold in the setting of GI bleed-will clarify indications with a cardiologist. -Continue home statin. Does she take Ranexa?  Hypotension: Hypotensive to 80s/50s overnight but not symptomatic. Normotensive this morning. -Start low-dose midodrine.  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 Recent Labs    09/06/19 1602 09/06/19 2101 09/07/19 0752  GLUCAP 93 106* 70  -Stop Lantus -Continue SSI-moderate. -Continue statin.  CKD-4: Stable. -Nephrology to see patient today.  Hypothyroidism -Continue home Synthroid.  History of depression: Stable. -Continue home Prozac  Debility: -PT/OT                 DVT prophylaxis: SCD in the setting of GI bleed. Code Status: Full code Family Communication: Updated patient's daughter over the phone.  Discharge barrier: GI bleed requiring blood transfusion and close monitoring.  Plan for EGD Patient is from: Home. Final disposition: Likely home in the next 48 to 72 hours if H&H stable and cleared by consultants.  Consultants:  Gastroenterology Cardiology   Sch Meds:  Scheduled Meds: . ALPRAZolam  0.25 mg Oral QHS  . chlorhexidine  15 mL Mouth Rinse BID  . Chlorhexidine Gluconate Cloth  6 each Topical Daily  . FLUoxetine  10 mg Oral Daily  . insulin aspart  0-15 Units Subcutaneous TID WC  . insulin aspart  0-5 Units Subcutaneous QHS  . insulin glargine  6 Units Subcutaneous QHS  . levothyroxine  50 mcg Oral Q T,Th,S,Su  . levothyroxine  75 mcg Oral Q M,W,F  . loratadine  10 mg Oral Daily  . magnesium oxide  200 mg Oral Daily  . mouth rinse  15 mL Mouth Rinse q12n4p  . mexiletine  200 mg Oral Q12H  . [START ON 09/08/2019] pantoprazole  40 mg Intravenous Q12H  . rOPINIRole  0.25 mg Oral QHS  . simvastatin  20 mg Oral QHS  . torsemide  40 mg Oral Daily   Continuous Infusions: . sodium chloride    . sodium chloride    . pantoprozole (PROTONIX) infusion 8 mg/hr (09/07/19 0934)   PRN Meds:.acetaminophen, albuterol, sodium chloride  Antimicrobials: Anti-infectives (From admission, onward)   None       I have personally reviewed the  following labs and images: CBC: Recent Labs  Lab 09/04/19 1800 09/04/19 1800 09/05/19 0047 09/05/19 0709 09/05/19 2242 09/06/19 0219 09/06/19 0823 09/06/19 1803 09/07/19 0428  WBC 8.7  --   --   --   --   --  5.8  --  5.1  NEUTROABS 5.4  --   --   --   --   --  3.7  --   --   HGB 6.6*   < > 7.4*   < > 7.4* 8.1* 7.6* 9.6* 8.7*  HCT 20.5*  --  22.2*  --   --   --  23.3*  --  26.2*  MCV 95.8  --   --   --   --   --  93.2  --  93.2  PLT 199  --   --   --   --   --  124*  --  130*   < > = values in this interval not displayed.   BMP &GFR Recent Labs  Lab 09/04/19 1800 09/05/19 0709 09/06/19 0823 09/07/19 0428  NA  136 138 138 141  K 4.3 4.2 3.8 3.5  CL 97* 98 102 102  CO2 30 30 27 30   GLUCOSE 106* 104* 80 86  BUN 37* 36* 35* 33*  CREATININE 2.50* 2.36* 2.14* 2.13*  CALCIUM 8.4* 8.4* 8.3* 8.5*  MG  --   --  2.4 2.4  PHOS  --   --   --  3.5   Estimated Creatinine Clearance: 23.6 mL/min (A) (by C-G formula based on SCr of 2.13 mg/dL (H)). Liver & Pancreas: Recent Labs  Lab 09/04/19 1800 09/07/19 0428  AST 19  --   ALT 10  --   ALKPHOS 67  --   BILITOT 1.0  --   PROT 6.6  --   ALBUMIN 3.1* 3.2*   Recent Labs  Lab 09/04/19 1800  LIPASE 25   No results for input(s): AMMONIA in the last 168 hours. Diabetic: Recent Labs    09/06/19 1803  HGBA1C 5.5   Recent Labs  Lab 09/06/19 1204 09/06/19 1305 09/06/19 1602 09/06/19 2101 09/07/19 0752  GLUCAP 65* 96 93 106* 70   Cardiac Enzymes: No results for input(s): CKTOTAL, CKMB, CKMBINDEX, TROPONINI in the last 168 hours. No results for input(s): PROBNP in the last 8760 hours. Coagulation Profile: Recent Labs  Lab 09/04/19 1800  INR 1.4*   Thyroid Function Tests: No results for input(s): TSH, T4TOTAL, FREET4, T3FREE, THYROIDAB in the last 72 hours. Lipid Profile: No results for input(s): CHOL, HDL, LDLCALC, TRIG, CHOLHDL, LDLDIRECT in the last 72 hours. Anemia Panel: No results for input(s): VITAMINB12,  FOLATE, FERRITIN, TIBC, IRON, RETICCTPCT in the last 72 hours. Urine analysis:    Component Value Date/Time   COLORURINE YELLOW 09/04/2019 1800   APPEARANCEUR CLEAR 09/04/2019 1800   LABSPEC 1.015 09/04/2019 1800   PHURINE 7.5 09/04/2019 1800   GLUCOSEU NEGATIVE 09/04/2019 1800   HGBUR LARGE (A) 09/04/2019 1800   BILIRUBINUR NEGATIVE 09/04/2019 1800   KETONESUR NEGATIVE 09/04/2019 1800   PROTEINUR 30 (A) 09/04/2019 1800   NITRITE NEGATIVE 09/04/2019 1800   LEUKOCYTESUR SMALL (A) 09/04/2019 1800   Sepsis Labs: Invalid input(s): PROCALCITONIN, [place]  Microbiology: Recent Results (from the past 240 hour(s))  SARS CORONAVIRUS 2 (TAT 6-24 HRS) Nasopharyngeal Nasopharyngeal Swab     Status: None   Collection Time: 08/30/19 11:35 AM   Specimen: Nasopharyngeal Swab  Result Value Ref Range Status   SARS Coronavirus 2 NEGATIVE NEGATIVE Final    Comment: (NOTE) SARS-CoV-2 target nucleic acids are NOT DETECTED. The SARS-CoV-2 RNA is generally detectable in upper and lower respiratory specimens during the acute phase of infection. Negative results do not preclude SARS-CoV-2 infection, do not rule out co-infections with other pathogens, and should not be used as the sole basis for treatment or other patient management decisions. Negative results must be combined with clinical observations, patient history, and epidemiological information. The expected result is Negative. Fact Sheet for Patients: SugarRoll.be Fact Sheet for Healthcare Providers: https://www.woods-mathews.com/ This test is not yet approved or cleared by the [place] FDA and  has been authorized for detection and/or diagnosis of SARS-CoV-2 by FDA under an Emergency Use Authorization (EUA). This EUA will remain  in effect (meaning this test can be used) for the duration of the COVID-19 declaration under Section 56 4(b)(1) of the Act, 21 U.S.C. section 360bbb-3(b)(1),  unless the authorization is terminated or revoked sooner. Performed at [facility], [place] [address], [place],  [address]   KOH prep     Status:  None   Collection Time: 09/01/19 10:10 AM   Specimen: Esophagus  Result Value Ref Range Status   Specimen Description ESOPHAGUS  Final   Special Requests NONE  Final   KOH Prep   Final    BUDDING YEAST SEEN Performed at [facility], [address] [place], [place] [address]    Report Status 09/01/2019 FINAL  Final  Respiratory Panel by RT PCR (Flu A&B, Covid) - Nasopharyngeal Swab     Status: None   Collection Time: 09/05/19 12:47 AM   Specimen: Nasopharyngeal Swab  Result Value Ref Range Status   SARS Coronavirus 2 by RT PCR NEGATIVE NEGATIVE Final    Comment: (NOTE) SARS-CoV-2 target nucleic acids are NOT DETECTED. The SARS-CoV-2 RNA is generally detectable in upper respiratoy specimens during the acute phase of infection. The lowest concentration of SARS-CoV-2 viral copies this assay can detect is 131 copies/mL. A negative result does not preclude SARS-Cov-2 infection and should not be used as the sole basis for treatment or other patient management decisions. A negative result may occur with  improper specimen collection/handling, submission of specimen other than nasopharyngeal swab, presence of viral mutation(s) within the areas targeted by this assay, and inadequate number of viral copies (<131 copies/mL). A negative result must be combined with clinical observations, patient history, and epidemiological information. The expected result is Negative. Fact Sheet for Patients:  PinkCheek.be Fact Sheet for Healthcare Providers:  GravelBags.it This test is not yet ap proved or cleared by the [place] FDA and  has been authorized for detection and/or diagnosis of SARS-CoV-2 by FDA under an Emergency Use Authorization (EUA). This EUA will remain    in effect (meaning this test can be used) for the duration of the COVID-19 declaration under Section 564(b)(1) of the Act, 21 U.S.C. section 360bbb-3(b)(1), unless the authorization is terminated or revoked sooner.    Influenza A by PCR NEGATIVE NEGATIVE Final   Influenza B by PCR NEGATIVE NEGATIVE Final    Comment: (NOTE) The Xpert Xpress SARS-CoV-2/FLU/RSV assay is intended as an aid in  the diagnosis of influenza from Nasopharyngeal swab specimens and  should not be used as a sole basis for treatment. Nasal washings and  aspirates are unacceptable for Xpert Xpress SARS-CoV-2/FLU/RSV  testing. Fact Sheet for Patients: PinkCheek.be Fact Sheet for Healthcare Providers: GravelBags.it This test is not yet approved or cleared by the [place] FDA and  has been authorized for detection and/or diagnosis of SARS-CoV-2 by  FDA under an Emergency Use Authorization (EUA). This EUA will remain  in effect (meaning this test can be used) for the duration of the  Covid-19 declaration under Section 564(b)(1) of the Act, 21  U.S.C. section 360bbb-3(b)(1), unless the authorization is  terminated or revoked. Performed at [facility], [address] [place], [place] [address]   MRSA PCR Screening     Status: None   Collection Time: 09/05/19  3:35 AM   Specimen: Nasal Mucosa; Nasopharyngeal  Result Value Ref Range Status   MRSA by PCR NEGATIVE NEGATIVE Final    Comment:        The GeneXpert MRSA Assay (FDA approved for NASAL specimens only), is one component of a comprehensive MRSA colonization surveillance program. It is not intended to diagnose MRSA infection nor to guide or monitor treatment for MRSA infections. Performed at [facility], [address], [place], [place] [address]     Radiology Studies: No results found.     Mayson Mcneish T. Jerone Cudmore  [facility]  If 7PM-7AM, please contact  night-coverage [email or web address] Password [email or web address] 09/07/2019, 10:14 AM

## 2019-09-07 NOTE — Progress Notes (Signed)
[name] , MD [address], [place], [place], [place], [address] [address], [place], [place], [place], [address] Phone: [phone]  Fax: [phone]   Heather Peterson is being followed for upper GI bleed   Subjective: Doing well , denies any further bleeding    Objective: Vital signs in last 24 hours: Vitals:   09/06/19 2057 09/07/19 0222 09/07/19 0224 09/07/19 0410  BP: 105/70 (!) 84/51 (!) 87/56 (!) 98/53  Pulse: 80 76 77 76  Resp: 20 12  18   Temp: 97.9 F (36.6 C) 97.8 F (36.6 C)  98.5 F (36.9 C)  TempSrc: Oral Oral  Oral  SpO2: 98% 100% 100% 100%  Weight:      Height:       Weight change:   Intake/Output Summary (Last 24 hours) at 09/07/2019 1356 Last data filed at 09/07/2019 7124 Gross per 24 hour  Intake 580.93 ml  Output 525 ml  Net 55.93 ml     Exam:  Abdomen: soft, nontender, normal bowel sounds   Lab Results: @LABTEST2 @ Micro Results: Recent Results (from the past 240 hour(s))  SARS CORONAVIRUS 2 (TAT 6-24 HRS) Nasopharyngeal Nasopharyngeal Swab     Status: None   Collection Time: 08/30/19 11:35 AM   Specimen: Nasopharyngeal Swab  Result Value Ref Range Status   SARS Coronavirus 2 NEGATIVE NEGATIVE Final    Comment: (NOTE) SARS-CoV-2 target nucleic acids are NOT DETECTED. The SARS-CoV-2 RNA is generally detectable in upper and lower respiratory specimens during the acute phase of infection. Negative results do not preclude SARS-CoV-2 infection, do not rule out co-infections with other pathogens, and should not be used as the sole basis for treatment or other patient management decisions. Negative results must be combined with clinical observations, patient history, and epidemiological information. The expected result is Negative. Fact Sheet for Patients: SugarRoll.be Fact Sheet for Healthcare Providers: https://www.woods-mathews.com/ This test is not yet approved or cleared by the  [place] FDA and  has been authorized for detection and/or diagnosis of SARS-CoV-2 by FDA under an Emergency Use Authorization (EUA). This EUA will remain  in effect (meaning this test can be used) for the duration of the COVID-19 declaration under Section 56 4(b)(1) of the Act, 21 U.S.C. section 360bbb-3(b)(1), unless the authorization is terminated or revoked sooner. Performed at [facility], [place] [address] [place], [place] [address]   KOH prep     Status: None   Collection Time: 09/01/19 10:10 AM   Specimen: Esophagus  Result Value Ref Range Status   Specimen Description ESOPHAGUS  Final   Special Requests NONE  Final   KOH Prep   Final    BUDDING YEAST SEEN Performed at [facility], [address] [place], [place] [address]    Report Status 09/01/2019 FINAL  Final  Respiratory Panel by RT PCR (Flu A&B, Covid) - Nasopharyngeal Swab     Status: None   Collection Time: 09/05/19 12:47 AM   Specimen: Nasopharyngeal Swab  Result Value Ref Range Status   SARS Coronavirus 2 by RT PCR NEGATIVE NEGATIVE Final    Comment: (NOTE) SARS-CoV-2 target nucleic acids are NOT DETECTED. The SARS-CoV-2 RNA is generally detectable in upper respiratoy specimens during the acute phase of infection. The lowest concentration of SARS-CoV-2 viral copies this assay can detect is 131 copies/mL. A negative result does not preclude SARS-Cov-2 infection and should not be used as the sole basis for treatment or other patient management decisions. A negative result  may occur with  improper specimen collection/handling, submission of specimen other than nasopharyngeal swab, presence of viral mutation(s) within the areas targeted by this assay, and inadequate number of viral copies (<131 copies/mL). A negative result must be combined with clinical observations, patient history, and epidemiological information. The expected result is Negative. Fact Sheet for Patients:    PinkCheek.be Fact Sheet for Healthcare Providers:  GravelBags.it This test is not yet ap proved or cleared by the [place] FDA and  has been authorized for detection and/or diagnosis of SARS-CoV-2 by FDA under an Emergency Use Authorization (EUA). This EUA will remain  in effect (meaning this test can be used) for the duration of the COVID-19 declaration under Section 564(b)(1) of the Act, 21 U.S.C. section 360bbb-3(b)(1), unless the authorization is terminated or revoked sooner.    Influenza A by PCR NEGATIVE NEGATIVE Final   Influenza B by PCR NEGATIVE NEGATIVE Final    Comment: (NOTE) The Xpert Xpress SARS-CoV-2/FLU/RSV assay is intended as an aid in  the diagnosis of influenza from Nasopharyngeal swab specimens and  should not be used as a sole basis for treatment. Nasal washings and  aspirates are unacceptable for Xpert Xpress SARS-CoV-2/FLU/RSV  testing. Fact Sheet for Patients: PinkCheek.be Fact Sheet for Healthcare Providers: GravelBags.it This test is not yet approved or cleared by the [place] FDA and  has been authorized for detection and/or diagnosis of SARS-CoV-2 by  FDA under an Emergency Use Authorization (EUA). This EUA will remain  in effect (meaning this test can be used) for the duration of the  Covid-19 declaration under Section 564(b)(1) of the Act, 21  U.S.C. section 360bbb-3(b)(1), unless the authorization is  terminated or revoked. Performed at [facility], [address] [place], [place] [address]   MRSA PCR Screening     Status: None   Collection Time: 09/05/19  3:35 AM   Specimen: Nasal Mucosa; Nasopharyngeal  Result Value Ref Range Status   MRSA by PCR NEGATIVE NEGATIVE Final    Comment:        The GeneXpert MRSA Assay (FDA approved for NASAL specimens only), is one component of a comprehensive MRSA  colonization surveillance program. It is not intended to diagnose MRSA infection nor to guide or monitor treatment for MRSA infections. Performed at [facility], [address], [place], [place] [address]    Studies/Results: No results found. Medications: I have reviewed the patient's current medications. Scheduled Meds: . ALPRAZolam  0.25 mg Oral QHS  . chlorhexidine  15 mL Mouth Rinse BID  . Chlorhexidine Gluconate Cloth  6 each Topical Daily  . FLUoxetine  10 mg Oral Daily  . insulin aspart  0-15 Units Subcutaneous TID WC  . insulin aspart  0-5 Units Subcutaneous QHS  . levothyroxine  50 mcg Oral Q T,Th,S,Su  . levothyroxine  75 mcg Oral Q M,W,F  . loratadine  10 mg Oral Daily  . magnesium oxide  200 mg Oral Daily  . mouth rinse  15 mL Mouth Rinse q12n4p  . mexiletine  200 mg Oral Q12H  . midodrine  5 mg Oral BID WC  . [START ON 09/08/2019] pantoprazole  40 mg Intravenous Q12H  . rOPINIRole  0.25 mg Oral QHS  . simvastatin  20 mg Oral QHS  . torsemide  40 mg Oral Daily   Continuous Infusions: . sodium chloride    . sodium chloride    . pantoprozole (PROTONIX) infusion 8 mg/hr (09/07/19 0934)   PRN Meds:.acetaminophen, albuterol, sodium  chloride   Assessment: Active Problems:   HTN (hypertension)   Diabetes (HCC)   CHF (congestive heart failure), NYHA class III, chronic, combined (HCC)   COPD (chronic obstructive pulmonary disease) (HCC)   Diabetes mellitus type 2, uncomplicated (HCC)   CKD (chronic kidney disease), stage III   UGI bleed   Acute blood loss anemia   Gastrointestinal hemorrhage  Heather Peterson /age female history of anemia and GI bleeding presented with hematemesis after having an EGD and colonoscopy few days back.  Patient restarted her Plavix and had melena likely from upper GI bleed she has had hematemesis.  Endoscopy has been held at this point of time since she has been on Plavix recently. Likely bleeding from portal hypertensive  gastropathy  Plan: 1.  Plan for EGD when off Plavix for at least 3 days ie tomorrow .  In the interim if has further bleeding to obtain bleeding scan.    I have discussed alternative options, risks & benefits,  which include, but are not limited to, bleeding, infection, perforation,respiratory complication & drug reaction.  The patient agrees with this plan & written consent will be obtained.         LOS: 3 days   [name], MD 09/07/2019, 1:56 PM

## 2019-09-07 NOTE — Evaluation (Signed)
Occupational Therapy Evaluation Patient Details Name: Heather Peterson MRN: [id number] DOB: 03/07/1941 Today's Date: 09/07/2019    History of Present Illness Heather Peterson is a 79 year old female with history of CAD s/p stent in 2015, combined CHF, CKD-4, DM-2, hypothyroidism, gastritis, diverticulosis and GI bleed presenting with hematemesis and hematochezia. Patient had EGD and colonoscopy on 4/21. EGD revealed esophageal plaques (biopsied) concerning for candidiasis, eosinophilic esophagitis, gastritis and moderate portal hypertensive gastropathy. Colonoscopy on 4/21 with severe sigmoid diverticulosis, nonbleeding internal hemorrhoids and polyps in descending and transverse colon that were resected and retrieved. Patient was resumed on Plavix and discharged home.   Clinical Impression   Heather Peterson was seen for OT evaluation this date. Prior to hospital admission, pt was MOD I for mobility either using SPC or no AD in home and sitting to bathe. Pt lives c husband and daughter who works from home. Pt presents to acute OT demonstrating impaired ADL performance and functional mobility 2/2 decreased LB access, and functional strength/balance/endurance deficits. Pt currently requires SUP toileting at [facility] c TOTAL A + RW for pericare in standing. MOD A don B socks seated EOB and MIN A don/doff gown seated on BSC. Pt required MIN A + RW sit>stand at EOB, improving to CGA + RW sit<>stand at higher height of BSC. Pt would benefit from skilled OT to address noted impairments and functional limitations (see below for any additional details) in order to maximize safety and independence while minimizing falls risk and caregiver burden. Upon hospital discharge, recommend HHOT to maximize pt safety and return to functional independence during meaningful occupations of daily life.     Follow Up Recommendations  Home health OT;Other (comment)(SUP OOB)    Equipment Recommendations  3 in 1 bedside commode     Recommendations for Other Services       Precautions / Restrictions Precautions Precautions: Fall Restrictions Weight Bearing Restrictions: No      Mobility Bed Mobility Overal bed mobility: Needs Assistance Bed Mobility: Supine to Sit     Supine to sit: Min assist;HOB elevated     General bed mobility comments: Assist to turn hips   Transfers Overall transfer level: Needs assistance Equipment used: Rolling walker (2 wheeled) Transfers: Sit to/from [facility] Sit to Stand: Min guard;Min assist;From elevated surface Stand pivot transfers: Min assist       General transfer comment: MIN A + RW sit>stand from bed. CGA  + RW sit<>stand at [facility] (heigher height)    Balance Overall balance assessment: Needs assistance Sitting-balance support: Single extremity supported;Feet supported Sitting balance-Leahy Scale: Fair     Standing balance support: Bilateral upper extremity supported Standing balance-Leahy Scale: Fair Standing balance comment: Intermittent postural sway noted c BUE support in standing                           ADL either performed or assessed with clinical judgement   ADL Overall ADL's : Needs assistance/impaired                                       General ADL Comments: SUP toileting at [facility] - TOTAL A pericare in standing. MIN A don/doff gown seated on BSC. MOD A don B socks seated EOB assist for threading     Vision Baseline Vision/History: Wears glasses Wears Glasses: At all times       Perception  Praxis      Pertinent Vitals/Pain Pain Assessment: Faces Faces Pain Scale: Hurts a little bit Pain Location: L IV site and headache  Pain Descriptors / Indicators: Headache;Discomfort Pain Intervention(s): Limited activity within patient's tolerance;Other (comment)(RN aware - will change IV)     Hand Dominance Right   Extremity/Trunk Assessment Upper Extremity Assessment Upper Extremity  Assessment: Overall WFL for tasks assessed   Lower Extremity Assessment Lower Extremity Assessment: Overall WFL for tasks assessed       Communication Communication Communication: No difficulties   Cognition Arousal/Alertness: Awake/alert Behavior During Therapy: WFL for tasks assessed/performed Overall Cognitive Status: Within Functional Limits for tasks assessed                                     General Comments       Exercises Exercises: Other exercises Other Exercises Other Exercises: Pt educated re: OT role, falls prevention, energy conservations, PLB, importance of assistance for OOB Other Exercises: Toileting at [facility], pericare, sup>sit, sit<>stand x2, SPT, bed mobility, don/doff gown,    Shoulder Instructions      Home Living Family/patient expects to be discharged to:: Private residence Living Arrangements: Spouse/significant other;Children(husband, daugther, small dog ) Available Help at Discharge: Family;Available 24 hours/day(DTR works from home) Type of Home: House Home Access: Stairs to enter Technical brewer of Steps: 3(2 STE for porch and threshold step) Entrance Stairs-Rails: None Home Layout: One level     Bathroom Shower/Tub: Occupational psychologist: Standard     Home Equipment: Environmental consultant - 4 wheels;Cane - single point;Shower seat;Grab bars - tub/shower   Additional Comments: pt reports "2 posts" at STE she uses      Prior Functioning/Environment Level of Independence: Independent        Comments: Denies AD use, rarely leaves house, sits to bathe. 2L O2 at baseline        OT Problem List: Decreased strength;Decreased range of motion;Decreased activity tolerance;Impaired balance (sitting and/or standing);Decreased knowledge of use of DME or AE      OT Treatment/Interventions: Self-care/ADL training;Therapeutic exercise;Neuromuscular education;Energy conservation;DME and/or AE instruction;Therapeutic  activities;Visual/perceptual remediation/compensation;Patient/family education;Balance training    OT Goals(Current goals can be found in the care plan section) Acute Rehab OT Goals Patient Stated Goal: To get out of bed more OT Goal Formulation: With patient Time For Goal Achievement: 09/21/19 Potential to Achieve Goals: Good ADL Goals Pt Will Perform Lower Body Dressing: with min assist;sit to/from stand(c LRAD PRN) Pt Will Transfer to Toilet: with supervision;ambulating;bedside commode(c LRAD PRN) Pt Will Perform Toileting - Clothing Manipulation and hygiene: sit to/from stand;with supervision;with set-up(c LRAD PRN)  OT Frequency: Min 2X/week   Barriers to D/C: Inaccessible home environment          Co-evaluation              AM-PAC OT "6 Clicks" Daily Activity     Outcome Measure Help from another person eating meals?: None Help from another person taking care of personal grooming?: None Help from another person toileting, which includes using toliet, bedpan, or urinal?: A Lot Help from another person bathing (including washing, rinsing, drying)?: A Lot Help from another person to put on and taking off regular upper body clothing?: A Little Help from another person to put on and taking off regular lower body clothing?: A Lot 6 Click Score: 17   End of Session Equipment Utilized During Treatment: Oxygen(2L)  Activity Tolerance: Patient tolerated treatment well Patient left: in chair;with call bell/phone within reach;with chair alarm set  OT Visit Diagnosis: Unsteadiness on feet (R26.81);Other abnormalities of gait and mobility (R26.89)                Time: 3276-1470 OT Time Calculation (min): 24 min Charges:  OT General Charges $OT Visit: 1 Visit OT Evaluation $OT Eval Low Complexity: 1 Low OT Treatments $Self Care/Home Management : 8-22 mins  Dessie Coma, M.S. OTR/L  09/07/19, 3:35 PM

## 2019-09-07 NOTE — Evaluation (Signed)
Physical Therapy Evaluation Patient Details Name: Heather Peterson MRN: [id number] DOB: Aug 04, 1940 Today's Date: 09/07/2019   History of Present Illness  Heather Peterson is a 79 year old female with history of CAD s/p stent in 2015, combined CHF, CKD-4, DM-2, hypothyroidism, gastritis, diverticulosis and GI bleed presenting with hematemesis and hematochezia. Patient had EGD and colonoscopy on 4/21. EGD revealed esophageal plaques (biopsied) concerning for candidiasis, eosinophilic esophagitis, gastritis and moderate portal hypertensive gastropathy. Colonoscopy on 4/21 with severe sigmoid diverticulosis, nonbleeding internal hemorrhoids and polyps in descending and transverse colon that were resected and retrieved. Patient was resumed on Plavix and discharged home.  Clinical Impression  Pt did well with mobility and ambulation, though she is still quite far from her baseline.  She was able to ambulate ~75 ft but needed some UE support (did better with walker than rail, does not typically use any AD).  Pt's O2 remained in the 90s on 2L O2 t/o the effort.  Pt has been having HHPT and will benefit from continuing this, encouraged her to use 4WW when she returns home.      Follow Up Recommendations Home health PT    Equipment Recommendations  None recommended by PT(has ADs)    Recommendations for Other Services       Precautions / Restrictions Precautions Precautions: Fall Restrictions Weight Bearing Restrictions: No      Mobility  Bed Mobility Overal bed mobility: Needs Assistance Bed Mobility: Supine to Sit     Supine to sit: Min assist;HOB elevated     General bed mobility comments: in recliner on arrival, not tested  Transfers Overall transfer level: Modified independent Equipment used: Rolling walker (2 wheeled) Transfers: Sit to/from Stand Sit to Stand: Min guard Stand pivot transfers: Min assist       General transfer comment: Pt was able to rise to standing with no  direct physical assist  Ambulation/Gait Ambulation/Gait assistance: Min guard Gait Distance (Feet): 75 Feet Assistive device: Rolling walker (2 wheeled);1 person hand held assist       General Gait Details: Pt with slow but safe ambulation with walker.  Trialed ambulation with single rail and no UEs but unable to maintain cadence with guarded unsteadiness  Stairs            Wheelchair Mobility    Modified Rankin (Stroke Patients Only)       Balance Overall balance assessment: Needs assistance Sitting-balance support: Single extremity supported;Feet supported Sitting balance-Leahy Scale: Good     Standing balance support: Bilateral upper extremity supported Standing balance-Leahy Scale: Fair Standing balance comment: Intermittent postural sway noted c BUE support in standing                             Pertinent Vitals/Pain Pain Assessment: Faces Faces Pain Scale: Hurts a little bit Pain Location: minimal c/o pain in L arm IV sites Pain Descriptors / Indicators: Headache;Discomfort Pain Intervention(s): Limited activity within patient's tolerance;Other (comment)(RN aware - will change IV)    Home Living Family/patient expects to be discharged to:: Private residence Living Arrangements: Spouse/significant other;Children Available Help at Discharge: Family;Available 24 hours/day Type of Home: House Home Access: Stairs to enter Entrance Stairs-Rails: (posts b/l, but no rails) Entrance Stairs-Number of Steps: 3 Home Layout: One level Home Equipment: Walker - 4 wheels;Cane - single point;Shower seat;Grab bars - tub/shower Additional Comments: pt reports "2 posts" at STE she uses    Prior Function Level of Independence: Independent  Comments: no AD, but admits using furniture, countertops, etc     Hand Dominance   Dominant Hand: Right    Extremity/Trunk Assessment   Upper Extremity Assessment Upper Extremity Assessment: Overall WFL  for tasks assessed    Lower Extremity Assessment Lower Extremity Assessment: Overall WFL for tasks assessed       Communication   Communication: No difficulties  Cognition Arousal/Alertness: Awake/alert Behavior During Therapy: WFL for tasks assessed/performed Overall Cognitive Status: Within Functional Limits for tasks assessed                                        General Comments      Exercises Other Exercises Other Exercises: Pt educated re: OT role, falls prevention, energy conservations, PLB, importance of assistance for OOB Other Exercises: Toileting at [facility], pericare, sup>sit, sit<>stand x2, SPT, bed mobility, don/doff gown,    Assessment/Plan    PT Assessment Patient needs continued PT services  PT Problem List Decreased strength;Decreased activity tolerance;Decreased range of motion;Decreased balance;Decreased mobility;Decreased coordination;Decreased knowledge of use of DME;Decreased safety awareness       PT Treatment Interventions DME instruction;Gait training;Stair training;Functional mobility training;Therapeutic activities;Therapeutic exercise;Balance training;Neuromuscular re-education;Patient/family education    PT Goals (Current goals can be found in the Care Plan section)  Acute Rehab PT Goals Patient Stated Goal: go home ASAP PT Goal Formulation: With patient Time For Goal Achievement: 09/21/19 Potential to Achieve Goals: Good    Frequency Min 2X/week   Barriers to discharge        Co-evaluation               AM-PAC PT "6 Clicks" Mobility  Outcome Measure Help needed turning from your back to your side while in a flat bed without using bedrails?: A Little Help needed moving from lying on your back to sitting on the side of a flat bed without using bedrails?: A Little Help needed moving to and from a bed to a chair (including a wheelchair)?: A Little Help needed standing up from a chair using your arms (e.g., wheelchair or  bedside chair)?: A Little Help needed to walk in hospital room?: A Little Help needed climbing 3-5 steps with a railing? : A Little 6 Click Score: 18    End of Session Equipment Utilized During Treatment: Gait belt Activity Tolerance: Patient tolerated treatment well;Patient limited by fatigue Patient left: with call bell/phone within reach;with chair alarm set Nurse Communication: Mobility status PT Visit Diagnosis: Muscle weakness (generalized) (M62.81);Difficulty in walking, not elsewhere classified (R26.2)    Time: 3254-9826 PT Time Calculation (min) (ACUTE ONLY): 28 min   Charges:   PT Evaluation $PT Eval Low Complexity: 1 Low PT Treatments $Gait Training: 8-22 mins        Kreg Shropshire, DPT 09/07/2019, 6:06 PM

## 2019-09-07 NOTE — Care Management Important Message (Signed)
Important Message  Patient Details  Name: Heather Peterson MRN: [id number] Date of Birth: December 20, 1940   Medicare Important Message Given:  Yes     Dannette Barbara 09/07/2019, 11:21 AM

## 2019-09-07 NOTE — Progress Notes (Signed)
[facility]  ROUNDING NOTE   Subjective:   Heather Peterson admitted to [facility] on 09/04/2019 for Acute blood loss anemia [D62] UGI bleed [K92.2] Gastrointestinal hemorrhage, unspecified gastrointestinal hemorrhage type [K92.2]  Patient is followed by my office for chronic kidney disease stage IV and has history of difficult to control hypertension and peripheral edema. These seem to be well controlled currently. Outpatient diuretic regimen of torsemide 58m bid.   Objective:  Vital signs in last 24 hours:  Temp:  [97.6 F (36.4 C)-98.5 F (36.9 C)] 98.5 F (36.9 C) (04/27 0410) Pulse Rate:  [76-80] 76 (04/27 0410) Resp:  [12-20] 18 (04/27 0410) BP: (84-105)/(51-70) 98/53 (04/27 0410) SpO2:  [98 %-100 %] 100 % (04/27 0410)  Weight change:  Filed Weights   09/04/19 1755  Weight: 86.6 kg    Intake/Output: I/O last 3 completed shifts: In: 1040.8 [P.O.:480; I.V.:180.8; Blood:380] Out: 1525 [[[id number]]  Intake/Output this shift:  Total I/O In: -  Out: 200 [Urine:200]  Physical Exam: General: NAD,   Head: Normocephalic, atraumatic. Moist oral mucosal membranes  Eyes: Anicteric, PERRL  Neck: Supple, trachea midline  Lungs:  Clear to auscultation  Heart: Regular rate and rhythm  Abdomen:  Soft, nontender,   Extremities:  no peripheral edema.  Neurologic: Nonfocal, moving all four extremities  Skin: No lesions        Basic Metabolic Panel: Recent Labs  Lab 09/04/19 1800 09/04/19 1800 09/05/19 0709 09/06/19 0823 09/07/19 0428  NA 136  --  138 138 141  K 4.3  --  4.2 3.8 3.5  CL 97*  --  98 102 102  CO2 30  --  30 27 30   GLUCOSE 106*  --  104* 80 86  BUN 37*  --  36* 35* 33*  CREATININE 2.50*  --  2.36* 2.14* 2.13*  CALCIUM 8.4*   < > 8.4* 8.3* 8.5*  MG  --   --   --  2.4 2.4  PHOS  --   --   --   --  3.5   < > = values in this interval not displayed.    Liver Function Tests: Recent Labs  Lab 09/04/19 1800 09/07/19 0428  AST 19  --    ALT 10  --   ALKPHOS 67  --   BILITOT 1.0  --   PROT 6.6  --   ALBUMIN 3.1* 3.2*   Recent Labs  Lab 09/04/19 1800  LIPASE 25   No results for input(s): AMMONIA in the last 168 hours.  CBC: Recent Labs  Lab 09/04/19 1800 09/04/19 1800 09/05/19 0047 09/05/19 0709 09/05/19 2242 09/06/19 0219 09/06/19 0823 09/06/19 1803 09/07/19 0428  WBC 8.7  --   --   --   --   --  5.8  --  5.1  NEUTROABS 5.4  --   --   --   --   --  3.7  --   --   HGB 6.6*   < > 7.4*   < > 7.4* 8.1* 7.6* 9.6* 8.7*  HCT 20.5*  --  22.2*  --   --   --  23.3*  --  26.2*  MCV 95.8  --   --   --   --   --  93.2  --  93.2  PLT 199  --   --   --   --   --  124*  --  130*   < > = values  in this interval not displayed.    Cardiac Enzymes: No results for input(s): CKTOTAL, CKMB, CKMBINDEX, TROPONINI in the last 168 hours.  BNP: Invalid input(s): POCBNP  CBG: Recent Labs  Lab 09/06/19 1204 09/06/19 1305 09/06/19 1602 09/06/19 2101 09/07/19 0752  GLUCAP 65* 96 93 106* 11    Microbiology: Results for orders placed or performed during the hospital encounter of 09/04/19  Respiratory Panel by RT PCR (Flu A&B, Covid) - Nasopharyngeal Swab     Status: None   Collection Time: 09/05/19 12:47 AM   Specimen: Nasopharyngeal Swab  Result Value Ref Range Status   SARS Coronavirus 2 by RT PCR NEGATIVE NEGATIVE Final    Comment: (NOTE) SARS-CoV-2 target nucleic acids are NOT DETECTED. The SARS-CoV-2 RNA is generally detectable in upper respiratoy specimens during the acute phase of infection. The lowest concentration of SARS-CoV-2 viral copies this assay can detect is 131 copies/mL. A negative result does not preclude SARS-Cov-2 infection and should not be used as the sole basis for treatment or other patient management decisions. A negative result may occur with  improper specimen collection/handling, submission of specimen other than nasopharyngeal swab, presence of viral mutation(s) within the areas  targeted by this assay, and inadequate number of viral copies (<131 copies/mL). A negative result must be combined with clinical observations, patient history, and epidemiological information. The expected result is Negative. Fact Sheet for Patients:  PinkCheek.be Fact Sheet for Healthcare Providers:  GravelBags.it This test is not yet ap proved or cleared by the [place] FDA and  has been authorized for detection and/or diagnosis of SARS-CoV-2 by FDA under an Emergency Use Authorization (EUA). This EUA will remain  in effect (meaning this test can be used) for the duration of the COVID-19 declaration under Section 564(b)(1) of the Act, 21 U.S.C. section 360bbb-3(b)(1), unless the authorization is terminated or revoked sooner.    Influenza A by PCR NEGATIVE NEGATIVE Final   Influenza B by PCR NEGATIVE NEGATIVE Final    Comment: (NOTE) The Xpert Xpress SARS-CoV-2/FLU/RSV assay is intended as an aid in  the diagnosis of influenza from Nasopharyngeal swab specimens and  should not be used as a sole basis for treatment. Nasal washings and  aspirates are unacceptable for Xpert Xpress SARS-CoV-2/FLU/RSV  testing. Fact Sheet for Patients: PinkCheek.be Fact Sheet for Healthcare Providers: GravelBags.it This test is not yet approved or cleared by the [place] FDA and  has been authorized for detection and/or diagnosis of SARS-CoV-2 by  FDA under an Emergency Use Authorization (EUA). This EUA will remain  in effect (meaning this test can be used) for the duration of the  Covid-19 declaration under Section 564(b)(1) of the Act, 21  U.S.C. section 360bbb-3(b)(1), unless the authorization is  terminated or revoked. Performed at [facility], [address] [place], [place] [address]   MRSA PCR Screening     Status: None   Collection Time: 09/05/19  3:35 AM    Specimen: Nasal Mucosa; Nasopharyngeal  Result Value Ref Range Status   MRSA by PCR NEGATIVE NEGATIVE Final    Comment:        The GeneXpert MRSA Assay (FDA approved for NASAL specimens only), is one component of a comprehensive MRSA colonization surveillance program. It is not intended to diagnose MRSA infection nor to guide or monitor treatment for MRSA infections. Performed at [facility], [address] [place], [place] [address]     Coagulation Studies: Recent Labs    09/04/19 1800  LABPROT  17.0*  INR 1.4*    Urinalysis: Recent Labs    09/04/19 1800  COLORURINE YELLOW  LABSPEC 1.015  PHURINE 7.5  GLUCOSEU NEGATIVE  HGBUR LARGE*  BILIRUBINUR NEGATIVE  KETONESUR NEGATIVE  PROTEINUR 30*  NITRITE NEGATIVE  LEUKOCYTESUR SMALL*      Imaging: No results found.   Medications:   . sodium chloride    . sodium chloride    . pantoprozole (PROTONIX) infusion 8 mg/hr (09/07/19 0934)   . ALPRAZolam  0.25 mg Oral QHS  . chlorhexidine  15 mL Mouth Rinse BID  . Chlorhexidine Gluconate Cloth  6 each Topical Daily  . FLUoxetine  10 mg Oral Daily  . insulin aspart  0-15 Units Subcutaneous TID WC  . insulin aspart  0-5 Units Subcutaneous QHS  . levothyroxine  50 mcg Oral Q T,Th,S,Su  . levothyroxine  75 mcg Oral Q M,W,F  . loratadine  10 mg Oral Daily  . magnesium oxide  200 mg Oral Daily  . mouth rinse  15 mL Mouth Rinse q12n4p  . mexiletine  200 mg Oral Q12H  . midodrine  5 mg Oral BID WC  . [START ON 09/08/2019] pantoprazole  40 mg Intravenous Q12H  . rOPINIRole  0.25 mg Oral QHS  . simvastatin  20 mg Oral QHS  . torsemide  40 mg Oral Daily   acetaminophen, albuterol, sodium chloride  Assessment/ Plan:  Heather Peterson is a 79 y.o. white female with diabetes mellitus type II, hypertension, COPD, congestive heart failure, GERD, hyperlipidemia, and hypothyroidism who is admitted to [facility] on 09/04/2019 for Acute blood loss anemia [D62] UGI  bleed [K92.2] Gastrointestinal hemorrhage, unspecified gastrointestinal hemorrhage type [K92.2] EGD and colonscopy on 4/21 with candidiasis and eosinophilic esophagitis.   1. Chronic Kidney Disease stage IV with proteinuria: chronic kidney disease secondary to diabetic nephropathy Creatinine at baseline.  Baseline creatinine of 2.14, GFR of 22 on 07/20/2019.  2. Anemia with acute blood loss and anemia of chronic kidney disease.  Status post PRBC transfusion.  Appreciate GI input. Endoscopy scheduled for tomorrow. EPO to be considered  - PPI gtt  3. Hypotension: on midodrine and high dose loop diuretics: torsemide.   4. Hypokalemia: secondary to torsemide.    LOS: 3 Jemal Miskell 4/27/20211:30 PM

## 2019-09-08 ENCOUNTER — Inpatient Hospital Stay: Payer: Medicare Other | Admitting: Anesthesiology

## 2019-09-08 ENCOUNTER — Encounter: Payer: Self-pay | Admitting: Internal Medicine

## 2019-09-08 ENCOUNTER — Encounter
Admission: EM | Disposition: A | Discharge status: Home Health | Payer: Self-pay | Source: Home / Self Care | Attending: Student

## 2019-09-08 DIAGNOSIS — K920 Hematemesis: Secondary | ICD-10-CM

## 2019-09-08 DIAGNOSIS — K31819 Angiodysplasia of stomach and duodenum without bleeding: Secondary | ICD-10-CM

## 2019-09-08 HISTORY — PX: ESOPHAGOGASTRODUODENOSCOPY (EGD) WITH PROPOFOL: SHX5813

## 2019-09-08 LAB — RENAL FUNCTION PANEL
Albumin: 3.2 g/dL — ABNORMAL LOW (ref 3.5–5.0)
Anion gap: 10 (ref 5–15)
BUN: 27 mg/dL — ABNORMAL HIGH (ref 8–23)
CO2: 30 mmol/L (ref 22–32)
Calcium: 8.6 mg/dL — ABNORMAL LOW (ref 8.9–10.3)
Chloride: 101 mmol/L (ref 98–111)
Creatinine, Ser: 2.08 mg/dL — ABNORMAL HIGH (ref 0.44–1.00)
GFR calc Af Amer: 26 mL/min — ABNORMAL LOW (ref >60)
GFR calc non Af Amer: 22 mL/min — ABNORMAL LOW (ref >60)
Glucose, Bld: 94 mg/dL (ref 70–99)
Phosphorus: 3.3 mg/dL (ref 2.5–4.6)
Potassium: 3.3 mmol/L — ABNORMAL LOW (ref 3.5–5.1)
Sodium: 141 mmol/L (ref 135–145)

## 2019-09-08 LAB — CBC
HCT: 27.5 % — ABNORMAL LOW (ref 36.0–46.0)
Hemoglobin: 8.8 g/dL — ABNORMAL LOW (ref 12.0–15.0)
MCH: 30.8 pg (ref 26.0–34.0)
MCHC: 32 g/dL (ref 30.0–36.0)
MCV: 96.2 fL (ref 80.0–100.0)
Platelets: 136 10*3/uL — ABNORMAL LOW (ref 150–400)
RBC: 2.86 MIL/uL — ABNORMAL LOW (ref 3.87–5.11)
RDW: 18.1 % — ABNORMAL HIGH (ref 11.5–15.5)
WBC: 6 10*3/uL (ref 4.0–10.5)
nRBC: 0 % (ref 0.0–0.2)

## 2019-09-08 LAB — GLUCOSE, CAPILLARY
Glucose-Capillary: 79 mg/dL (ref 70–99)
Glucose-Capillary: 79 mg/dL (ref 70–99)
Glucose-Capillary: 81 mg/dL (ref 70–99)
Glucose-Capillary: 82 mg/dL (ref 70–99)
Glucose-Capillary: 109 mg/dL — ABNORMAL HIGH (ref 70–99)

## 2019-09-08 LAB — MAGNESIUM: Magnesium: 2.1 mg/dL (ref 1.7–2.4)

## 2019-09-08 LAB — PTH, INTACT AND CALCIUM
Calcium, Total (PTH): 8.6 mg/dL — ABNORMAL LOW (ref 8.7–10.3)
PTH: 40 pg/mL (ref 15–65)

## 2019-09-08 SURGERY — ESOPHAGOGASTRODUODENOSCOPY (EGD) WITH PROPOFOL
Anesthesia: General

## 2019-09-08 MED ORDER — PROPOFOL 10 MG/ML IV BOLUS
INTRAVENOUS | Status: DC | PRN
  Administered 2019-09-08: 20 mg via INTRAVENOUS
  Administered 2019-09-08: 60 mg via INTRAVENOUS

## 2019-09-08 MED ORDER — POTASSIUM CHLORIDE CRYS ER 20 MEQ PO TBCR
40.0000 meq | EXTENDED_RELEASE_TABLET | Freq: Once | ORAL | Status: AC
  Administered 2019-09-08: 40 meq via ORAL
  Filled 2019-09-08: qty 2

## 2019-09-08 MED ORDER — SODIUM CHLORIDE 0.9 % IV SOLN: INTRAVENOUS | Status: DC

## 2019-09-08 MED ORDER — LIDOCAINE HCL (CARDIAC) PF 100 MG/5ML IV SOSY
PREFILLED_SYRINGE | INTRAVENOUS | Status: DC | PRN
  Administered 2019-09-08: 100 mg via INTRAVENOUS

## 2019-09-08 MED ORDER — PHENYLEPHRINE HCL (PRESSORS) 10 MG/ML IV SOLN
INTRAVENOUS | Status: DC | PRN
  Administered 2019-09-08: 100 ug via INTRAVENOUS

## 2019-09-08 NOTE — H&P (Signed)
[name], MD [address], [place], [place], [place], [address] [address], [place], [place], [place], [address] Phone: [phone]  Fax: [phone]  Primary Care Physician:  [name], MD   Pre-Procedure History & Physical: HPI:  Heather Peterson is a 79 y.o. female is here for an endoscopy    Past Medical History:  Diagnosis Date  . Anxiety   . Chronic combined systolic and diastolic CHF (congestive heart failure) ([place])   . Chronic kidney disease   . Coronary artery disease   . Depression   . Diabetes mellitus without complication ([place])   . Diabetes mellitus, type II ([place])   . Hypertension   . MI (myocardial infarction) ([place])    x 5  . Pacemaker   . Prolonged Q-T interval on ECG   . Thyroid disease     Past Surgical History:  Procedure Laterality Date  . CENTRAL LINE INSERTION  03/11/2017   Procedure: CENTRAL LINE INSERTION;  Surgeon: Leonie Man, MD;  Location: [facility];  Service: Cardiovascular;;  . CHOLECYSTECTOMY    . COLONOSCOPY WITH PROPOFOL N/A 09/01/2019   Procedure: COLONOSCOPY WITH PROPOFOL;  Surgeon: Toledo, Benay Pike, MD;  Location: [facility];  Service: Gastroenterology;  Laterality: N/A;  . CORONARY STENT INTERVENTION W/IMPELLA N/A 03/11/2017   Procedure: Coronary Stent Intervention w/Impella;  Surgeon: Leonie Man, MD;  Location: [facility];  Service: Cardiovascular;  Laterality: N/A;  . ESOPHAGOGASTRODUODENOSCOPY (EGD) WITH PROPOFOL N/A 09/01/2019   Procedure: ESOPHAGOGASTRODUODENOSCOPY (EGD) WITH PROPOFOL;  Surgeon: Toledo, Benay Pike, MD;  Location: [facility];  Service: Gastroenterology;  Laterality: N/A;  . EYE SURGERY    . INTRAVASCULAR PRESSURE WIRE/FFR STUDY N/A 03/11/2017   Procedure: INTRAVASCULAR PRESSURE WIRE/FFR STUDY;  Surgeon: Leonie Man, MD;  Location: [facility];  Service: Cardiovascular;  Laterality: N/A;  . INTRAVASCULAR ULTRASOUND/IVUS N/A 03/11/2017   Procedure: Intravascular Ultrasound/IVUS;  Surgeon: Leonie Man, MD;  Location: [facility];  Service: Cardiovascular;  Laterality: N/A;  . LEFT HEART CATH AND CORONARY ANGIOGRAPHY N/A 03/05/2017   Procedure: LEFT HEART CATH AND CORONARY ANGIOGRAPHY;  Surgeon: Isaias Cowman, MD;  Location: [facility];  Service: Cardiovascular;  Laterality: N/A;  . PACEMAKER IMPLANT    . pacemaker/defibrillator Left     Prior to Admission medications   Medication Sig Start Date End Date Taking? Authorizing Provider  acetaminophen (TYLENOL) 500 MG tablet Take 500 mg by mouth every 4 (four) hours as needed for mild pain, fever or headache.    Yes [provider]  albuterol (PROVENTIL HFA;VENTOLIN HFA) 108 (90 Base) MCG/ACT inhaler Inhale 2 puffs into the lungs every 4 (four) hours as needed for wheezing or shortness of breath. 02/05/17  Yes Gouru, Illene Silver, MD  aspirin EC 81 MG tablet Take 81 mg by mouth at bedtime.    Yes [provider]  clopidogrel (PLAVIX) 75 MG tablet Take 75 mg by mouth daily.   Yes [provider]  ferrous sulfate 324 MG TBEC Take 324 mg by mouth daily.   Yes [provider]  fexofenadine (ALLEGRA) 180 MG tablet Take 180 mg by mouth daily.   Yes [provider]  FLUoxetine (PROZAC) 10 MG capsule Take 10 mg by mouth daily. 06/29/19  Yes [provider]  LANTUS SOLOSTAR 100 UNIT/ML Solostar Pen Inject 16 Units into the skin at bedtime. 06/09/19  Yes [provider]  levothyroxine (SYNTHROID) 50 MCG tablet  Take 50 mcg by mouth See admin instructions. Take 1 tablet (28mcg) by mouth every Tuesday, Thursday, Saturday and Sunday before breakfast   Yes [provider]  levothyroxine (SYNTHROID, LEVOTHROID) 75 MCG tablet Take 75 mcg by mouth See admin instructions. Take 1 tablet (48mcg) by mouth every Monday, Wednesday and Friday morning before breakfast   Yes [provider]  magnesium oxide  (MAG-OX) 400 MG tablet Take 200 mg by mouth daily.   Yes [provider]  mexiletine (MEXITIL) 200 MG capsule Take 1 capsule (200 mg total) by mouth every 12 (twelve) hours. 03/14/17  Yes Seiler, Amber K, NP  midodrine (PROAMATINE) 5 MG tablet Take 5 mg by mouth daily as needed.    Yes [provider]  OXYGEN Inhale 2 L into the lungs continuous.   Yes [provider]  pantoprazole (PROTONIX) 40 MG tablet Take 40 mg by mouth 2 (two) times daily.  05/16/19  Yes [provider]  Prenatal Vit-Fe Fumarate-FA (PRENATAL MULTIVITAMIN) TABS tablet Take 1 tablet by mouth daily.   Yes [provider]  ranolazine (RANEXA) 500 MG 12 hr tablet Take 1 tablet (500 mg total) by mouth 2 (two) times daily. 03/14/17  Yes Seiler, Amber K, NP  simvastatin (ZOCOR) 40 MG tablet Take 20 mg by mouth at bedtime.    Yes [provider]  sodium chloride (OCEAN) 0.65 % SOLN nasal spray Place 1 spray into both nostrils as needed for congestion. 07/05/19  Yes Fritzi Mandes, MD  torsemide (DEMADEX) 20 MG tablet Take 2 tablets (40 mg total) by mouth daily. 08/27/19 11/25/19 Yes Hackney, Otila Kluver A, FNP  traZODone (DESYREL) 50 MG tablet Take 50 mg by mouth at bedtime.   Yes [provider]  triamcinolone (NASACORT) 55 MCG/ACT AERO nasal inhaler Place 2 sprays into the nose 2 (two) times daily. 07/05/19  Yes Fritzi Mandes, MD    Allergies as of 09/04/2019 - Review Complete 09/04/2019  Allergen Reaction Noted  . Celebrex [celecoxib] Anaphylaxis 02/04/2017  . Glipizide Anaphylaxis 08/03/2013  . Lisinopril Swelling 05/19/2019  . Sulfa antibiotics Other (See Comments) and Anaphylaxis 08/03/2013  . Levaquin [levofloxacin in d5w] Other (See Comments) 02/04/2017  . Levofloxacin Other (See Comments) 02/10/2017  . Metformin Other (See Comments) 08/03/2013  . Penicillins Rash and Other (See Comments) 08/03/2013    Family History  Problem Relation Age of Onset  . Hypertension Father   .  Heart attack Father   . Depression Sister   . Depression Brother   . Depression Brother     Social History   Socioeconomic History  . Marital status: Married    Spouse name: rodney  . Number of children: 2  . Years of education: Not on file  . Highest education level: High school graduate  Occupational History  . Not on file  Tobacco Use  . Smoking status: Current Every Day Smoker    Packs/day: 0.25    Types: E-cigarettes, Cigarettes  . Smokeless tobacco: Never Used  Substance and Sexual Activity  . Alcohol use: Not Currently    Comment: occasionally  . Drug use: No  . Sexual activity: Not Currently  Other Topics Concern  . Not on file  Social History Narrative  . Not on file   Social Determinants of Health   Financial Resource Strain:   . Difficulty of Paying Living Expenses:   Food Insecurity:   . Worried About Charity fundraiser in the Last Year:   . [employer] of  Food in the Last Year:   Transportation Needs:   . Film/video editor (Medical):   Marland Kitchen Lack of Transportation (Non-Medical):   Physical Activity:   . Days of Exercise per Week:   . Minutes of Exercise per Session:   Stress:   . Feeling of Stress :   Social Connections:   . Frequency of Communication with Friends and Family:   . Frequency of Social Gatherings with Friends and Family:   . Attends Religious Services:   . Active Member of Clubs or Organizations:   . Attends Archivist Meetings:   Marland Kitchen Marital Status:   Intimate Partner Violence:   . Fear of Current or Ex-Partner:   . Emotionally Abused:   Marland Kitchen Physically Abused:   . Sexually Abused:     Review of Systems: See HPI, otherwise negative ROS  Physical Exam: BP (!) 105/56 (BP Location: Right Arm)   Pulse 81   Temp 97.6 F (36.4 C)   Resp 18   Ht 5\' 4"  (1.626 m)   Wt 86.6 kg   SpO2 100%   BMI 32.79 kg/m  General:   Alert,  pleasant and cooperative in NAD Head:  Normocephalic and atraumatic. Neck:  Supple; no masses or  thyromegaly. Lungs:  Clear throughout to auscultation, normal respiratory effort.    Heart:  +S1, +S2, Regular rate and rhythm, No edema. Abdomen:  Soft, nontender and nondistended. Normal bowel sounds, without guarding, and without rebound.   Neurologic:  Alert and  oriented x4;  grossly normal neurologically.  Impression/Plan: Heather Peterson is here for an endoscopy  to be performed for  evaluation of hematemesis    Risks, benefits, limitations, and alternatives regarding endoscopy have been reviewed with the patient.  Questions have been answered.  All parties agreeable.   [name], MD  09/08/2019, 1:16 PM

## 2019-09-08 NOTE — Progress Notes (Signed)
PROGRESS NOTE    Heather Peterson  IWO:[id number] DOB: August 03, 1940 DOA: 09/04/2019 PCP: [name], MD    Brief Narrative:  79 year old female with history of CAD s/p stent in 2015, combined CHF, CKD-4, DM-2, hypothyroidism, gastritis, diverticulosis and GI bleed presenting with hematemesis and hematochezia. Patient had EGD and colonoscopy on 4/21. EGD revealed esophageal plaques (biopsied) concerning for candidiasis, eosinophilic esophagitis, gastritis and moderate portal hypertensive gastropathy. Colonoscopy on 4/21 with severe sigmoid diverticulosis, nonbleeding internal hemorrhoids and polyps in descending and transverse colon that were resected and retrieved. Patient was resumed on Plavix and discharged home.  On admission, Hgb 6.6 (baseline 9-10). Transfused 2 units with minimal response. Was also hypotensive. Started on Protonix drip. GI consulted to evaluate and admitted.  GI planning EGD after Plavix washout.  Also recommended bleeding scan if further bleeding in the interim.    Consultants:   GI  Procedures: EGD  Antimicrobials:      Subjective: Has no complaints.  Brown colored stool.  No nausea vomiting or abdominal pain  Objective: Vitals:   09/08/19 0536 09/08/19 1145 09/08/19 1327 09/08/19 1352  BP: (!) 116/57 (!) 105/56 105/68 113/71  Pulse: 80 81 79 75  Resp: 16 18 18  (!) 30  Temp: 98 F (36.7 C) 97.6 F (36.4 C) (!) 96.9 F (36.1 C)   TempSrc: Oral  Temporal   SpO2: 100% 100% 100% 94%  Weight:   86.2 kg   Height:   5' 4"  (1.626 m)     Intake/Output Summary (Last 24 hours) at 09/08/2019 1406 Last data filed at 09/08/2019 0904 Gross per 24 hour  Intake 0 ml  Output 750 ml  Net -750 ml   Filed Weights   09/04/19 1755 09/08/19 1327  Weight: 86.6 kg 86.2 kg    Examination:  General exam: Appears calm and comfortable  Respiratory system: Clear to auscultation. Respiratory effort normal. Cardiovascular system: S1 & S2 heard, RRR. No  JVD, murmurs, rubs, gallops or clicks. Gastrointestinal system: Abdomen is nondistended, soft and nontender. Normal bowel sounds heard. Central nervous system: Alert and oriented.  Grossly intact Extremities: No edema Skin: Warm dry Psychiatry:Mood & affect appropriate on current setting.     Data Reviewed: I have personally reviewed following labs and imaging studies  CBC: Recent Labs  Lab 09/04/19 1800 09/04/19 1800 09/05/19 0047 09/05/19 0709 09/06/19 0219 09/06/19 0823 09/06/19 1803 09/07/19 0428 09/08/19 0607  WBC 8.7  --   --   --   --  5.8  --  5.1 6.0  NEUTROABS 5.4  --   --   --   --  3.7  --   --   --   HGB 6.6*   < > 7.4*   < > 8.1* 7.6* 9.6* 8.7* 8.8*  HCT 20.5*  --  22.2*  --   --  23.3*  --  26.2* 27.5*  MCV 95.8  --   --   --   --  93.2  --  93.2 96.2  PLT 199  --   --   --   --  124*  --  130* 136*   < > = values in this interval not displayed.   Basic Metabolic Panel: Recent Labs  Lab 09/04/19 1800 09/04/19 1800 09/05/19 0709 09/06/19 0823 09/07/19 0428 09/07/19 0951 09/08/19 0607  NA 136  --  138 138 141  --  141  K 4.3  --  4.2 3.8 3.5  --  3.3*  CL  97*  --  98 102 102  --  101  CO2 30  --  30 27 30   --  30  GLUCOSE 106*  --  104* 80 86  --  94  BUN 37*  --  36* 35* 33*  --  27*  CREATININE 2.50*  --  2.36* 2.14* 2.13*  --  2.08*  CALCIUM 8.4*   < > 8.4* 8.3* 8.5* 8.6* 8.6*  MG  --   --   --  2.4 2.4  --  2.1  PHOS  --   --   --   --  3.5  --  3.3   < > = values in this interval not displayed.   GFR: Estimated Creatinine Clearance: 24.1 mL/min (A) (by C-G formula based on SCr of 2.08 mg/dL (H)). Liver Function Tests: Recent Labs  Lab 09/04/19 1800 09/07/19 0428 09/08/19 0607  AST 19  --   --   ALT 10  --   --   ALKPHOS 67  --   --   BILITOT 1.0  --   --   PROT 6.6  --   --   ALBUMIN 3.1* 3.2* 3.2*   Recent Labs  Lab 09/04/19 1800  LIPASE 25   No results for input(s): AMMONIA in the last 168 hours. Coagulation  Profile: Recent Labs  Lab 09/04/19 1800  INR 1.4*   Cardiac Enzymes: No results for input(s): CKTOTAL, CKMB, CKMBINDEX, TROPONINI in the last 168 hours. BNP (last 3 results) No results for input(s): PROBNP in the last 8760 hours. HbA1C: Recent Labs    09/06/19 1803  HGBA1C 5.5   CBG: Recent Labs  Lab 09/07/19 1749 09/07/19 2115 09/08/19 0756 09/08/19 1143 09/08/19 1324  GLUCAP 85 157* 79 79 81   Lipid Profile: No results for input(s): CHOL, HDL, LDLCALC, TRIG, CHOLHDL, LDLDIRECT in the last 72 hours. Thyroid Function Tests: No results for input(s): TSH, T4TOTAL, FREET4, T3FREE, THYROIDAB in the last 72 hours. Anemia Panel: No results for input(s): VITAMINB12, FOLATE, FERRITIN, TIBC, IRON, RETICCTPCT in the last 72 hours. Sepsis Labs: No results for input(s): PROCALCITON, LATICACIDVEN in the last 168 hours.  Recent Results (from the past 240 hour(s))  SARS CORONAVIRUS 2 (TAT 6-24 HRS) Nasopharyngeal Nasopharyngeal Swab     Status: None   Collection Time: 08/30/19 11:35 AM   Specimen: Nasopharyngeal Swab  Result Value Ref Range Status   SARS Coronavirus 2 NEGATIVE NEGATIVE Final    Comment: (NOTE) SARS-CoV-2 target nucleic acids are NOT DETECTED. The SARS-CoV-2 RNA is generally detectable in upper and lower respiratory specimens during the acute phase of infection. Negative results do not preclude SARS-CoV-2 infection, do not rule out co-infections with other pathogens, and should not be used as the sole basis for treatment or other patient management decisions. Negative results must be combined with clinical observations, patient history, and epidemiological information. The expected result is Negative. Fact Sheet for Patients: SugarRoll.be Fact Sheet for Healthcare Providers: https://www.woods-mathews.com/ This test is not yet approved or cleared by the [place] FDA and  has been authorized for detection and/or  diagnosis of SARS-CoV-2 by FDA under an Emergency Use Authorization (EUA). This EUA will remain  in effect (meaning this test can be used) for the duration of the COVID-19 declaration under Section 56 4(b)(1) of the Act, 21 U.S.C. section 360bbb-3(b)(1), unless the authorization is terminated or revoked sooner. Performed at [facility], [place] [address], [place], [place] [address]   KOH prep  Status: None   Collection Time: 09/01/19 10:10 AM   Specimen: Esophagus  Result Value Ref Range Status   Specimen Description ESOPHAGUS  Final   Special Requests NONE  Final   KOH Prep   Final    BUDDING YEAST SEEN Performed at [facility], [address] [place], [place] [address]    Report Status 09/01/2019 FINAL  Final  Respiratory Panel by RT PCR (Flu A&B, Covid) - Nasopharyngeal Swab     Status: None   Collection Time: 09/05/19 12:47 AM   Specimen: Nasopharyngeal Swab  Result Value Ref Range Status   SARS Coronavirus 2 by RT PCR NEGATIVE NEGATIVE Final    Comment: (NOTE) SARS-CoV-2 target nucleic acids are NOT DETECTED. The SARS-CoV-2 RNA is generally detectable in upper respiratoy specimens during the acute phase of infection. The lowest concentration of SARS-CoV-2 viral copies this assay can detect is 131 copies/mL. A negative result does not preclude SARS-Cov-2 infection and should not be used as the sole basis for treatment or other patient management decisions. A negative result may occur with  improper specimen collection/handling, submission of specimen other than nasopharyngeal swab, presence of viral mutation(s) within the areas targeted by this assay, and inadequate number of viral copies (<131 copies/mL). A negative result must be combined with clinical observations, patient history, and epidemiological information. The expected result is Negative. Fact Sheet for Patients:  PinkCheek.be Fact Sheet for Healthcare Providers:   GravelBags.it This test is not yet ap proved or cleared by the [place] FDA and  has been authorized for detection and/or diagnosis of SARS-CoV-2 by FDA under an Emergency Use Authorization (EUA). This EUA will remain  in effect (meaning this test can be used) for the duration of the COVID-19 declaration under Section 564(b)(1) of the Act, 21 U.S.C. section 360bbb-3(b)(1), unless the authorization is terminated or revoked sooner.    Influenza A by PCR NEGATIVE NEGATIVE Final   Influenza B by PCR NEGATIVE NEGATIVE Final    Comment: (NOTE) The Xpert Xpress SARS-CoV-2/FLU/RSV assay is intended as an aid in  the diagnosis of influenza from Nasopharyngeal swab specimens and  should not be used as a sole basis for treatment. Nasal washings and  aspirates are unacceptable for Xpert Xpress SARS-CoV-2/FLU/RSV  testing. Fact Sheet for Patients: PinkCheek.be Fact Sheet for Healthcare Providers: GravelBags.it This test is not yet approved or cleared by the [place] FDA and  has been authorized for detection and/or diagnosis of SARS-CoV-2 by  FDA under an Emergency Use Authorization (EUA). This EUA will remain  in effect (meaning this test can be used) for the duration of the  Covid-19 declaration under Section 564(b)(1) of the Act, 21  U.S.C. section 360bbb-3(b)(1), unless the authorization is  terminated or revoked. Performed at [facility], [address] [place], [place] [address]   MRSA PCR Screening     Status: None   Collection Time: 09/05/19  3:35 AM   Specimen: Nasal Mucosa; Nasopharyngeal  Result Value Ref Range Status   MRSA by PCR NEGATIVE NEGATIVE Final    Comment:        The GeneXpert MRSA Assay (FDA approved for NASAL specimens only), is one component of a comprehensive MRSA colonization surveillance program. It is not intended to diagnose MRSA infection nor to  guide or monitor treatment for MRSA infections. Performed at [facility], [address], [place], [place] [address]          Radiology Studies: No results found.  Scheduled Meds: . [MAR Hold] ALPRAZolam  0.25 mg Oral QHS  . [MAR Hold] chlorhexidine  15 mL Mouth Rinse BID  . [MAR Hold] Chlorhexidine Gluconate Cloth  6 each Topical Daily  . [MAR Hold] FLUoxetine  10 mg Oral Daily  . [MAR Hold] insulin aspart  0-15 Units Subcutaneous TID WC  . [MAR Hold] insulin aspart  0-5 Units Subcutaneous QHS  . [MAR Hold] levothyroxine  50 mcg Oral Q T,Th,S,Su  . [MAR Hold] levothyroxine  75 mcg Oral Q M,W,F  . [MAR Hold] loratadine  10 mg Oral Daily  . [MAR Hold] magnesium oxide  200 mg Oral Daily  . [MAR Hold] mouth rinse  15 mL Mouth Rinse q12n4p  . [MAR Hold] mexiletine  200 mg Oral Q12H  . [MAR Hold] midodrine  5 mg Oral BID WC  . [MAR Hold] pantoprazole  40 mg Intravenous Q12H  . [MAR Hold] potassium chloride  40 mEq Oral Once  . [MAR Hold] rOPINIRole  0.25 mg Oral QHS  . [MAR Hold] simvastatin  20 mg Oral QHS  . [MAR Hold] torsemide  40 mg Oral Daily   Continuous Infusions: . [MAR Hold] sodium chloride    . sodium chloride      Assessment & Plan:   Active Problems:   HTN (hypertension)   Diabetes (HCC)   CHF (congestive heart failure), NYHA class III, chronic, combined (HCC)   COPD (chronic obstructive pulmonary disease) (HCC)   Diabetes mellitus type 2, uncomplicated (HCC)   CKD (chronic kidney disease), stage III   UGI bleed   Acute blood loss anemia   Gastrointestinal hemorrhage  ABLA due to GIB: Concern about upper GI bleed. Presented with hematemesis and hematochezia with fresh and dark clots. Patient is on aspirin and Plavix. EGD and colonoscopy on 4/16 as above.  -Anemia panel on 4/16 normal. -Transfuse to keep Hgb above 8.0 given history of CAD. -Continue monitoring H&H          -Appreciate GI input-repeat EGD after Plavix washout. 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 Treated with argon plasma .  Bleeding scan if bleeding in the interim. Per GI, advance diet as tolerated, if Hg stable dc home with f/u with Dr. Alice Reichert as outpt. Continue home PPI.    Chronic combined CHF: Appears euvolemic. -Continue home torsemide.  -Monitor fluid status and renal function  Chronic respiratory failure on 2 L at baseline. Stable.  Chronic COPD: Stable. -As needed albuterol  History of CAD s/p stent in 2015 -okay to stop Plavix indefinitely and continuing low-dose aspirin per cardiology, Dr. Clayborn Bigness -Continue home statin. Does she take Ranexa?  Hypotension: -Start low-dose midodrine.  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 -Nephrology to see patient today.  Hypothyroidism -Continue home Synthroid.  History of depression: Stable. -Continue home Prozac  Debility: -PT/OT       DVT prophylaxis: scd Code Status:full Family Communication: None at bedside Disposition Plan: EGD today Barrier: EGD today and if hemoglobin stable can DC in a.m. but needs to be observed overnight for GI bleed       LOS: 4 days   Time spent: 45 minutes with more than 50% COC    Nolberto Hanlon, MD [facility] Pager [phone]  If 7PM-7AM, please contact night-coverage [email or web address] Password [email or web address]  09/08/2019, 2:06 PM

## 2019-09-08 NOTE — Anesthesia Postprocedure Evaluation (Signed)
Anesthesia Post Note  Patient: Allana Shrestha Begin  Procedure(s) Performed: ESOPHAGOGASTRODUODENOSCOPY (EGD) WITH PROPOFOL (N/A )  Patient location during evaluation: Endoscopy Anesthesia Type: General Level of consciousness: awake and alert and oriented Pain management: pain level controlled Vital Signs Assessment: post-procedure vital signs reviewed and stable Respiratory status: spontaneous breathing, nonlabored ventilation and respiratory function stable Cardiovascular status: blood pressure returned to baseline and stable Postop Assessment: no signs of nausea or vomiting Anesthetic complications: no     Last Vitals:  Vitals:   09/08/19 1327 09/08/19 1352  BP: 105/68 113/71  Pulse: 79 75  Resp: 18 (!) 30  Temp: (!) 36.1 C   SpO2: 100% 94%    Last Pain:  Vitals:   09/08/19 1412  TempSrc:   PainSc: 0-No pain                 Sriman Tally

## 2019-09-08 NOTE — Op Note (Signed)
[facility] Patient Name: Heather Peterson Procedure Date: 09/08/2019 1:29 PM MRN: [id number] Account #: [id number] Date of Birth: Mar 28, 1941 Admit Type: Inpatient Age: 79 Room: [facility] ENDO ROOM 4 Gender: Female Note Status: Finalized Procedure:             Upper GI endoscopy Indications:           Hematemesis Providers:             [name] MD, MD Referring MD:          [name] (Referring MD) Medicines:             Monitored Anesthesia Care Complications:         No immediate complications. Procedure:             Pre-Anesthesia Assessment:                        - Prior to the procedure, a History and Physical was                         performed, and patient medications, allergies and                         sensitivities were reviewed. The patient's tolerance                         of previous anesthesia was reviewed.                        - The risks and benefits of the procedure and the                         sedation options and risks were discussed with the                         patient. All questions were answered and informed                         consent was obtained.                        - ASA Grade Assessment: III - A patient with severe                         systemic disease.                        After obtaining informed consent, the endoscope was                         passed under direct vision. Throughout the procedure,                         the patient's blood pressure, pulse, and oxygen                         saturations were monitored continuously. The Endoscope                         was introduced through the mouth, and advanced to  the                         third part of duodenum. The upper GI endoscopy was                         accomplished with ease. The patient tolerated the                         procedure well. Findings:      The esophagus was normal.      The examined duodenum was  normal.      Multiple 5 to 8 mm angiodysplastic lesions with no bleeding were found       on the greater curvature of the stomach. Coagulation for bleeding       prevention using argon plasma at 1 liter/minute and 20 watts was       successful.      The cardia and gastric fundus were normal on retroflexion. Impression:            - Normal esophagus.                        - Normal examined duodenum.                        - Multiple non-bleeding angiodysplastic lesions in the                         stomach. Treated with argon plasma coagulation (APC).                        - No specimens collected. Recommendation:        - Return patient to hospital ward for ongoing care.                        - Advance diet as tolerated today.                        - 1.Continue on PPI omeprazole 40 mg at discharge                        2. Home tomorrow if Hb stable                        3. F/u with Dr Alice Reichert as an outpatient Procedure Code(s):     --- Professional ---                        [phone], Esophagogastroduodenoscopy, flexible,                         transoral; with control of bleeding, any method Diagnosis Code(s):     --- Professional ---                        K31.819, Angiodysplasia of stomach and duodenum                         without bleeding  K92.0, Hematemesis CPT copyright 2019 [employer]. All rights reserved. The codes documented in this report are preliminary and upon coder review may  be revised to meet current compliance requirements. [name], MD [name] MD, MD 09/08/2019 1:53:04 PM This report has been signed electronically. Number of Addenda: 0 Note Initiated On: 09/08/2019 1:29 PM Estimated Blood Loss:  Estimated blood loss: none.      [facility]

## 2019-09-08 NOTE — Anesthesia Preprocedure Evaluation (Signed)
Anesthesia Evaluation  Patient identified by MRN, date of birth, ID band Patient awake    Reviewed: Allergy & Precautions, NPO status , Patient's Chart, lab work & pertinent test results  History of Anesthesia Complications Negative for: history of anesthetic complications  Airway Mallampati: II  TM Distance: >3 FB Neck ROM: Full    Dental  (+) Edentulous Upper, Edentulous Lower   Pulmonary COPD,  oxygen dependent, Current Smoker and Patient abstained from smoking.,    breath sounds clear to auscultation- rhonchi (-) wheezing      Cardiovascular hypertension, Pt. on medications + CAD, + Past MI, + Cardiac Stents and +CHF  + pacemaker  Rhythm:Regular Rate:Normal - Systolic murmurs and - Diastolic murmurs    Neuro/Psych neg Seizures PSYCHIATRIC DISORDERS Anxiety Depression negative neurological ROS     GI/Hepatic negative GI ROS, Neg liver ROS,   Endo/Other  diabetes, Insulin DependentHypothyroidism   Renal/GU CRFRenal disease     Musculoskeletal negative musculoskeletal ROS (+)   Abdominal (+) + obese,   Peds  Hematology  (+) anemia ,   Anesthesia Other Findings Past Medical History: No date: Anxiety No date: Chronic combined systolic and diastolic CHF (congestive  heart failure) (HCC) No date: Chronic kidney disease No date: Coronary artery disease No date: Depression No date: Diabetes mellitus without complication (HCC) No date: Diabetes mellitus, type II (HCC) No date: Hypertension No date: MI (myocardial infarction) ([place])     Comment:  x 5 No date: Pacemaker No date: Prolonged Q-T interval on ECG No date: Thyroid disease   Reproductive/Obstetrics                             Anesthesia Physical Anesthesia Plan  ASA: IV  Anesthesia Plan: General   Post-op Pain Management:    Induction: Intravenous  PONV Risk Score and Plan: 1 and Propofol infusion  Airway Management  Planned: Natural Airway  Additional Equipment:   Intra-op Plan:   Post-operative Plan:   Informed Consent: I have reviewed the patients History and Physical, chart, labs and discussed the procedure including the risks, benefits and alternatives for the proposed anesthesia with the patient or authorized representative who has indicated his/her understanding and acceptance.     Dental advisory given  Plan Discussed with: CRNA and Anesthesiologist  Anesthesia Plan Comments:         Anesthesia Quick Evaluation

## 2019-09-08 NOTE — Transfer of Care (Signed)
Immediate Anesthesia Transfer of Care Note  Patient: Heather Peterson  Procedure(s) Performed: ESOPHAGOGASTRODUODENOSCOPY (EGD) WITH PROPOFOL (N/A )  Patient Location: PACU  Anesthesia Type:General  Level of Consciousness: sedated  Airway & Oxygen Therapy: Patient Spontanous Breathing  Post-op Assessment: Report given to RN and Post -op Vital signs reviewed and stable  Post vital signs: Reviewed and stable  Last Vitals:  Vitals Value Taken Time  BP 113/71 09/08/19 1352  Temp    Pulse 75 09/08/19 1353  Resp 17 09/08/19 1353  SpO2 92 % 09/08/19 1353  Vitals shown include unvalidated device data.  Last Pain:  Vitals:   09/08/19 1327  TempSrc: Temporal  PainSc: 0-No pain      Patients Stated Pain Goal: 0 (86/48/47 2072)  Complications: No apparent anesthesia complications

## 2019-09-09 ENCOUNTER — Encounter: Payer: Self-pay | Admitting: *Deleted

## 2019-09-09 LAB — BASIC METABOLIC PANEL
Anion gap: 8 (ref 5–15)
BUN: 22 mg/dL (ref 8–23)
CO2: 28 mmol/L (ref 22–32)
Calcium: 8.7 mg/dL — ABNORMAL LOW (ref 8.9–10.3)
Chloride: 103 mmol/L (ref 98–111)
Creatinine, Ser: 1.87 mg/dL — ABNORMAL HIGH (ref 0.44–1.00)
GFR calc Af Amer: 30 mL/min — ABNORMAL LOW (ref >60)
GFR calc non Af Amer: 25 mL/min — ABNORMAL LOW (ref >60)
Glucose, Bld: 113 mg/dL — ABNORMAL HIGH (ref 70–99)
Potassium: 4 mmol/L (ref 3.5–5.1)
Sodium: 139 mmol/L (ref 135–145)

## 2019-09-09 LAB — CBC
HCT: 28.1 % — ABNORMAL LOW (ref 36.0–46.0)
Hemoglobin: 8.9 g/dL — ABNORMAL LOW (ref 12.0–15.0)
MCH: 30.8 pg (ref 26.0–34.0)
MCHC: 31.7 g/dL (ref 30.0–36.0)
MCV: 97.2 fL (ref 80.0–100.0)
Platelets: 162 10*3/uL (ref 150–400)
RBC: 2.89 MIL/uL — ABNORMAL LOW (ref 3.87–5.11)
RDW: 17.9 % — ABNORMAL HIGH (ref 11.5–15.5)
WBC: 6 10*3/uL (ref 4.0–10.5)
nRBC: 0 % (ref 0.0–0.2)

## 2019-09-09 LAB — MAGNESIUM: Magnesium: 2.1 mg/dL (ref 1.7–2.4)

## 2019-09-09 LAB — GLUCOSE, CAPILLARY
Glucose-Capillary: 110 mg/dL — ABNORMAL HIGH (ref 70–99)
Glucose-Capillary: 135 mg/dL — ABNORMAL HIGH (ref 70–99)

## 2019-09-09 MED ORDER — PANTOPRAZOLE SODIUM 40 MG PO TBEC: 40.0000 mg | DELAYED_RELEASE_TABLET | Freq: Every day | ORAL | Status: DC

## 2019-09-09 NOTE — Progress Notes (Signed)
Patient requested to take a shower, so I notified NP, [place] and she put in an order for patient to take a shower. Patient then told me that she will take a shower when she gets home.  Will continue to monitor.  Heather Peterson 09/09/2019  3:26 AM

## 2019-09-09 NOTE — Progress Notes (Signed)
OT Cancellation Note  Patient Details Name: Heather Peterson MRN: [id number] DOB: 01/20/1941   Cancelled Treatment:    Reason Eval/Treat Not Completed: Patient declined, no reason specified   Attempted to engage pt in OT tx, but pt politely declined.  She reports all self care needs have been met and she has fatigue from a lot of out of bed activity this morning.  Will continue to follow up at next opportunity.  Myrtie Hawk Margia Wiesen, OTR/L 09/09/19, 12:54 PM

## 2019-09-09 NOTE — TOC Transition Note (Signed)
Transition of Care [facility]) - CM/SW Discharge Note   Patient Details  Name: Heather Peterson MRN: [id number] Date of Birth: 01/22/1941  [facility] CM/SW Contact:  Candie Chroman, LCSW Phone Number: 09/09/2019, 1:02 PM   Clinical Narrative: Patient has orders to discharge home today. [facility] representative is aware. No further concerns. CSW signing off.    Final next level of care: Home w Home Health Services Barriers to Discharge: Barriers Resolved   Patient Goals and CMS Choice     Choice offered to / list presented to : NA  Discharge Placement                Patient to be transferred to facility by: Per RN, daughter will pick her up.   Patient and family notified of of transfer: 09/09/19  Discharge Plan and Services     Post Acute Care Choice: Resumption of Svcs/PTA Provider                    HH Arranged: RN, PT, OT [facility] Agency: [facility] Date [place]: 09/09/19   Representative spoke with at [place]: [place] ([place]) Interventions     Readmission Risk Interventions Readmission Risk Prevention Plan 09/06/2019  Transportation Screening Complete  Medication Review Press photographer) Complete  PCP or Specialist appointment within 3-5 days of discharge Complete  HRI or [place] Complete  SW Recovery Care/Counseling Consult Complete  [place] Not Applicable  Some recent data might be hidden

## 2019-09-09 NOTE — Discharge Summary (Signed)
Heather Peterson WJX:[id number] DOB: Aug 19, 1940 DOA: 09/04/2019  PCP: [name], MD  Admit date: 09/04/2019 Discharge date: 09/09/2019  Admitted From: Home Disposition: Home  Recommendations for Outpatient Follow-up:  1. Follow up with PCP in 1 week 2. Please obtain BMP/CBC in one week 3. Gastroenterology Dr. Alice Reichert in 1 week  Home Health: Yes   Discharge Condition:Stable CODE STATUS: Full Diet recommendation: Heart Healthy  Brief/Interim Summary: Heather Peterson is a 79 y.o. female with medical history significant of CAD, combined CHF, CKD, DM, hypothyroid disease. She is followed by GI and heart failure clinic.She underwent colonoscopy 09/01/19 with a finding of diverticulosis w/o sign of bleeding and had two sessile polyps removed. 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  Patient complained of hematemesis of a reportedly large amount of blood with clots. GI was consulted.  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  Treated with argon plasma coagulation.  No specimen collected.  GI was cleared patient for discharge as hemoglobin was stable and needed lifelong PPI.  ABLA due to GIB: Concern about upper GI bleed. Presented with hematemesis and hematochezia with fresh and dark clots. Patient is on aspirin and Plavix. EGD and colonoscopy on 4/16 as above. -Anemia panel on 4/16 normal.          -Appreciate GI input-repeat EGD after Plavix washout. S/p EGD            today-Multiple non-bleeding angiodysplastic lesions in the stomach.                        Treated with argon plasma .       Chronic combined CHF: Appears euvolemic. -Continue  home torsemide.    Chronic respiratory failure on 2 L at baseline. Stable.  Chronic COPD: Stable. -As needed albuterol  History of CAD s/p stent in 2015 -okay tostop Plavix indefinitely and continuinglow-dose aspirin per cardiology, [place] -Continue home statin. Does she take Ranexa?, need to discuss with cardiology as outpt  Hypotension: -Start low-dose midodrine.  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 -Nephrology to see patient today.  Hypothyroidism -Continue home Synthroid.  History of depression: Stable. -Continue home Prozac  Debility: -PT/OT       Discharge Diagnoses:  Active Problems:   HTN (hypertension)   Diabetes (HCC)   CHF (congestive heart failure), NYHA class III, chronic, combined (HCC)   COPD (chronic obstructive pulmonary disease) (HCC)   Diabetes mellitus type 2, uncomplicated (HCC)   CKD (chronic kidney disease), stage III   UGI bleed   Acute blood loss anemia   Gastrointestinal hemorrhage    Discharge Instructions  Discharge Instructions    Call MD for:  persistant dizziness or light-headedness   Complete by: As directed    Call MD for:  temperature >100.4   Complete by: As directed    Diet - low sodium heart healthy   Complete by: As directed    Increase activity slowly   Complete by: As directed      Allergies as of 09/09/2019      Reactions   Celebrex [celecoxib] Anaphylaxis  Glipizide Anaphylaxis   Lisinopril Swelling   Lip and facial swelling   Sulfa Antibiotics Other (See Comments), Anaphylaxis   Reaction: unknown   Levaquin [levofloxacin In D5w] Other (See Comments)   Heart arrhthymias   Levofloxacin Other (See Comments)   ICD fired   Metformin Other (See Comments)   Gi tolerance    Penicillins Rash, Other (See Comments)    Has patient had a PCN reaction causing immediate rash, facial/tongue/throat swelling, SOB or lightheadedness with hypotension: Unknown Has patient had a PCN reaction causing severe rash involving mucus membranes or skin necrosis: No Has patient had a PCN reaction that required hospitalization: No Has patient had a PCN reaction occurring within the last 10 years: No If all of the above answers are "NO", then may proceed with Cephalosporin use.      Medication List    STOP taking these medications   clopidogrel 75 MG tablet Commonly known as: PLAVIX   ranolazine 500 MG 12 hr tablet Commonly known as: RANEXA     TAKE these medications   acetaminophen 500 MG tablet Commonly known as: TYLENOL Take 500 mg by mouth every 4 (four) hours as needed for mild pain, fever or headache.   albuterol 108 (90 Base) MCG/ACT inhaler Commonly known as: VENTOLIN HFA Inhale 2 puffs into the lungs every 4 (four) hours as needed for wheezing or shortness of breath.   aspirin EC 81 MG tablet Take 81 mg by mouth at bedtime.   ferrous sulfate 324 MG Tbec Take 324 mg by mouth daily.   fexofenadine 180 MG tablet Commonly known as: ALLEGRA Take 180 mg by mouth daily.   FLUoxetine 10 MG capsule Commonly known as: PROZAC Take 10 mg by mouth daily.   Lantus SoloStar 100 UNIT/ML Solostar Pen Generic drug: insulin glargine Inject 16 Units into the skin at bedtime.   levothyroxine 75 MCG tablet Commonly known as: SYNTHROID Take 75 mcg by mouth See admin instructions. Take 1 tablet (32mcg) by mouth every Monday, Wednesday and Friday morning before breakfast   levothyroxine 50 MCG tablet Commonly known as: SYNTHROID Take 50 mcg by mouth See admin instructions. Take 1 tablet (52mcg) by mouth every Tuesday, Thursday, Saturday and Sunday before breakfast   magnesium oxide 400 MG tablet Commonly known as: MAG-OX Take 200 mg by mouth daily.   mexiletine 200 MG capsule Commonly known as: MEXITIL Take 1  capsule (200 mg total) by mouth every 12 (twelve) hours.   midodrine 5 MG tablet Commonly known as: PROAMATINE Take 5 mg by mouth daily as needed.   OXYGEN Inhale 2 L into the lungs continuous.   pantoprazole 40 MG tablet Commonly known as: PROTONIX Take 40 mg by mouth 2 (two) times daily.   prenatal multivitamin Tabs tablet Take 1 tablet by mouth daily.   simvastatin 40 MG tablet Commonly known as: ZOCOR Take 20 mg by mouth at bedtime.   sodium chloride 0.65 % Soln nasal spray Commonly known as: OCEAN Place 1 spray into both nostrils as needed for congestion.   torsemide 20 MG tablet Commonly known as: DEMADEX Take 2 tablets (40 mg total) by mouth daily.   traZODone 50 MG tablet Commonly known as: DESYREL Take 50 mg by mouth at bedtime.   triamcinolone 55 MCG/ACT Aero nasal inhaler Commonly known as: NASACORT Place 2 sprays into the nose 2 (two) times daily.      Follow-up [place], MD Follow up in 1 week(s).  Specialty: Gastroenterology Contact information: [place] [address] [phone]        [name], MD Follow up in 1 week(s).   Specialty: Family Medicine Why: needs blood work Contact information: [place] [address] [phone]        Isaias Cowman, MD Follow up in 1 week(s).   Specialty: Cardiology Why: review of meds Contact information: [facility] [place] [address] [phone]          Allergies  Allergen Reactions  . Celebrex [Celecoxib] Anaphylaxis  . Glipizide Anaphylaxis  . Lisinopril Swelling    Lip and facial swelling  . Sulfa Antibiotics Other (See Comments) and Anaphylaxis    Reaction: unknown  . Levaquin [Levofloxacin In D5w] Other (See Comments)    Heart arrhthymias  . Levofloxacin Other (See Comments)    ICD fired  . Metformin Other (See Comments)    Gi tolerance   . Penicillins  Rash and Other (See Comments)    Has patient had a PCN reaction causing immediate rash, facial/tongue/throat swelling, SOB or lightheadedness with hypotension: Unknown Has patient had a PCN reaction causing severe rash involving mucus membranes or skin necrosis: No Has patient had a PCN reaction that required hospitalization: No Has patient had a PCN reaction occurring within the last 10 years: No If all of the above answers are "NO", then may proceed with Cephalosporin use.     Consultations:   Cards, gi  Procedures/Studies: DG Chest 2 View  Result Date: 08/27/2019 CLINICAL DATA:  Shortness of breath EXAM: CHEST - 2 VIEW COMPARISON:  07/16/2019 FINDINGS: Cardiac shadow remains enlarged. Defibrillator is again seen. Stable vascular congestion is noted without significant interstitial edema. Small right-sided pleural effusion is noted. No focal infiltrate is seen. No bony abnormality is noted. IMPRESSION: Mild vascular congestion with small right-sided pleural effusion. No significant edema is noted. Electronically Signed   By: Inez Catalina M.D.   On: 08/27/2019 19:24   US Abdomen Limited  Result Date: 08/20/2019 CLINICAL DATA:  Ascites. EXAM: LIMITED ABDOMEN ULTRASOUND FOR ASCITES TECHNIQUE: Limited ultrasound survey for ascites was performed in all four abdominal quadrants. COMPARISON:  08/06/2019. FINDINGS: Only a small amount of ascites noted on today's exam. Paracentesis was not performed. No other abnormality identified. Repeat exam can be obtained as needed. IMPRESSION: Small amount of ascites noted on today's exam. Paracentesis not performed. Repeat exam can be obtained as. Electronically Signed   By: Marcello Moores  Register   On: 08/20/2019 13:05       Subjective:  Has no complaints Discharge Exam: Vitals:   09/09/19 0307 09/09/19 1124  BP: 103/63 95/63  Pulse: 87 84  Resp: (!) 24 16  Temp: 97.7 F (36.5 C) 97.7 F (36.5 C)  SpO2: 100% 92%   Vitals:   09/08/19 1352 09/08/19  2033 09/09/19 0307 09/09/19 1124  BP: 113/71 (!) 92/53 103/63 95/63  Pulse: 75 80 87 84  Resp: (!) 30 20 (!) 24 16  Temp:  97.7 F (36.5 C) 97.7 F (36.5 C) 97.7 F (36.5 C)  TempSrc:  Oral Oral Oral  SpO2: 94% 100% 100% 92%  Weight:      Height:        General: Pt is alert, awake, not in acute distress Cardiovascular: RRR, S1/S2 +, no rubs, no gallops Respiratory: CTA bilaterally, no wheezing, no rhonchi Abdominal: Soft, NT, ND, bowel sounds + Extremities: no edema, no cyanosis    The  results of significant diagnostics from this hospitalization (including imaging, microbiology, ancillary and laboratory) are listed below for reference.     Microbiology: Recent Results (from the past 240 hour(s))  KOH prep     Status: None   Collection Time: 09/01/19 10:10 AM   Specimen: Esophagus  Result Value Ref Range Status   Specimen Description ESOPHAGUS  Final   Special Requests NONE  Final   KOH Prep   Final    BUDDING YEAST SEEN Performed at [facility], [address] [place], [place] [address]    Report Status 09/01/2019 FINAL  Final  Respiratory Panel by RT PCR (Flu A&B, Covid) - Nasopharyngeal Swab     Status: None   Collection Time: 09/05/19 12:47 AM   Specimen: Nasopharyngeal Swab  Result Value Ref Range Status   SARS Coronavirus 2 by RT PCR NEGATIVE NEGATIVE Final    Comment: (NOTE) SARS-CoV-2 target nucleic acids are NOT DETECTED. The SARS-CoV-2 RNA is generally detectable in upper respiratoy specimens during the acute phase of infection. The lowest concentration of SARS-CoV-2 viral copies this assay can detect is 131 copies/mL. A negative result does not preclude SARS-Cov-2 infection and should not be used as the sole basis for treatment or other patient management decisions. A negative result may occur with  improper specimen collection/handling, submission of specimen other than nasopharyngeal swab, presence of viral mutation(s) within the areas  targeted by this assay, and inadequate number of viral copies (<131 copies/mL). A negative result must be combined with clinical observations, patient history, and epidemiological information. The expected result is Negative. Fact Sheet for Patients:  PinkCheek.be Fact Sheet for Healthcare Providers:  GravelBags.it This test is not yet ap proved or cleared by the [place] FDA and  has been authorized for detection and/or diagnosis of SARS-CoV-2 by FDA under an Emergency Use Authorization (EUA). This EUA will remain  in effect (meaning this test can be used) for the duration of the COVID-19 declaration under Section 564(b)(1) of the Act, 21 U.S.C. section 360bbb-3(b)(1), unless the authorization is terminated or revoked sooner.    Influenza A by PCR NEGATIVE NEGATIVE Final   Influenza B by PCR NEGATIVE NEGATIVE Final    Comment: (NOTE) The Xpert Xpress SARS-CoV-2/FLU/RSV assay is intended as an aid in  the diagnosis of influenza from Nasopharyngeal swab specimens and  should not be used as a sole basis for treatment. Nasal washings and  aspirates are unacceptable for Xpert Xpress SARS-CoV-2/FLU/RSV  testing. Fact Sheet for Patients: PinkCheek.be Fact Sheet for Healthcare Providers: GravelBags.it This test is not yet approved or cleared by the [place] FDA and  has been authorized for detection and/or diagnosis of SARS-CoV-2 by  FDA under an Emergency Use Authorization (EUA). This EUA will remain  in effect (meaning this test can be used) for the duration of the  Covid-19 declaration under Section 564(b)(1) of the Act, 21  U.S.C. section 360bbb-3(b)(1), unless the authorization is  terminated or revoked. Performed at [facility], [address] [place], [place] [address]   MRSA PCR Screening     Status: None   Collection Time: 09/05/19  3:35 AM    Specimen: Nasal Mucosa; Nasopharyngeal  Result Value Ref Range Status   MRSA by PCR NEGATIVE NEGATIVE Final    Comment:        The GeneXpert MRSA Assay (FDA approved for NASAL specimens only), is one component of a comprehensive MRSA colonization surveillance program. It is not intended to diagnose MRSA  infection nor to guide or monitor treatment for MRSA infections. Performed at [facility], [address] [place], [place] [address]      Labs: BNP (last 3 results) Recent Labs    08/19/19 1408 08/27/19 1848 09/04/19 1759  BNP 722.0* 714.0* 401.0*   Basic Metabolic Panel: Recent Labs  Lab 09/05/19 0709 09/05/19 0709 09/06/19 0823 09/07/19 0428 09/07/19 0951 09/08/19 0607 09/09/19 0522  NA 138  --  138 141  --  141 139  K 4.2  --  3.8 3.5  --  3.3* 4.0  CL 98  --  102 102  --  101 103  CO2 30  --  27 30  --  30 28  GLUCOSE 104*  --  80 86  --  94 113*  BUN 36*  --  35* 33*  --  27* 22  CREATININE 2.36*  --  2.14* 2.13*  --  2.08* 1.87*  CALCIUM 8.4*   < > 8.3* 8.5* 8.6* 8.6* 8.7*  MG  --   --  2.4 2.4  --  2.1 2.1  PHOS  --   --   --  3.5  --  3.3  --    < > = values in this interval not displayed.   Liver Function Tests: Recent Labs  Lab 09/04/19 1800 09/07/19 0428 09/08/19 0607  AST 19  --   --   ALT 10  --   --   ALKPHOS 67  --   --   BILITOT 1.0  --   --   PROT 6.6  --   --   ALBUMIN 3.1* 3.2* 3.2*   Recent Labs  Lab 09/04/19 1800  LIPASE 25   No results for input(s): AMMONIA in the last 168 hours. CBC: Recent Labs  Lab 09/04/19 1800 09/04/19 1800 09/05/19 0047 09/05/19 0709 09/06/19 0823 09/06/19 1803 09/07/19 0428 09/08/19 0607 09/09/19 0522  WBC 8.7  --   --   --  5.8  --  5.1 6.0 6.0  NEUTROABS 5.4  --   --   --  3.7  --   --   --   --   HGB 6.6*   < > 7.4*   < > 7.6* 9.6* 8.7* 8.8* 8.9*  HCT 20.5*   < > 22.2*  --  23.3*  --  26.2* 27.5* 28.1*  MCV 95.8  --   --   --  93.2  --  93.2 96.2 97.2  PLT 199  --   --    --  124*  --  130* 136* 162   < > = values in this interval not displayed.   Cardiac Enzymes: No results for input(s): CKTOTAL, CKMB, CKMBINDEX, TROPONINI in the last 168 hours. BNP: Invalid input(s): POCBNP CBG: Recent Labs  Lab 09/08/19 1324 09/08/19 1648 09/08/19 2141 09/09/19 0802 09/09/19 1126  GLUCAP 81 82 109* 110* 135*   D-Dimer No results for input(s): DDIMER in the last 72 hours. Hgb A1c Recent Labs    09/06/19 1803  HGBA1C 5.5   Lipid Profile No results for input(s): CHOL, HDL, LDLCALC, TRIG, CHOLHDL, LDLDIRECT in the last 72 hours. Thyroid function studies No results for input(s): TSH, T4TOTAL, T3FREE, THYROIDAB in the last 72 hours.  Invalid input(s): FREET3 Anemia work up No results for input(s): VITAMINB12, FOLATE, FERRITIN, TIBC, IRON, RETICCTPCT in the last 72 hours. Urinalysis    Component Value Date/Time   COLORURINE YELLOW 09/04/2019 1800   APPEARANCEUR  CLEAR 09/04/2019 1800   LABSPEC 1.015 09/04/2019 1800   PHURINE 7.5 09/04/2019 1800   GLUCOSEU NEGATIVE 09/04/2019 1800   HGBUR LARGE (A) 09/04/2019 1800   BILIRUBINUR NEGATIVE 09/04/2019 1800   KETONESUR NEGATIVE 09/04/2019 1800   PROTEINUR 30 (A) 09/04/2019 1800   NITRITE NEGATIVE 09/04/2019 1800   LEUKOCYTESUR SMALL (A) 09/04/2019 1800   Sepsis Labs Invalid input(s): PROCALCITONIN,  WBC,  LACTICIDVEN Microbiology Recent Results (from the past 240 hour(s))  KOH prep     Status: None   Collection Time: 09/01/19 10:10 AM   Specimen: Esophagus  Result Value Ref Range Status   Specimen Description ESOPHAGUS  Final   Special Requests NONE  Final   KOH Prep   Final    BUDDING YEAST SEEN Performed at [facility], [address], [place], [place] [address]    Report Status 09/01/2019 FINAL  Final  Respiratory Panel by RT PCR (Flu A&B, Covid) - Nasopharyngeal Swab     Status: None   Collection Time: 09/05/19 12:47 AM   Specimen: Nasopharyngeal Swab  Result Value Ref Range Status    SARS Coronavirus 2 by RT PCR NEGATIVE NEGATIVE Final    Comment: (NOTE) SARS-CoV-2 target nucleic acids are NOT DETECTED. The SARS-CoV-2 RNA is generally detectable in upper respiratoy specimens during the acute phase of infection. The lowest concentration of SARS-CoV-2 viral copies this assay can detect is 131 copies/mL. A negative result does not preclude SARS-Cov-2 infection and should not be used as the sole basis for treatment or other patient management decisions. A negative result may occur with  improper specimen collection/handling, submission of specimen other than nasopharyngeal swab, presence of viral mutation(s) within the areas targeted by this assay, and inadequate number of viral copies (<131 copies/mL). A negative result must be combined with clinical observations, patient history, and epidemiological information. The expected result is Negative. Fact Sheet for Patients:  PinkCheek.be Fact Sheet for Healthcare Providers:  GravelBags.it This test is not yet ap proved or cleared by the [place] FDA and  has been authorized for detection and/or diagnosis of SARS-CoV-2 by FDA under an Emergency Use Authorization (EUA). This EUA will remain  in effect (meaning this test can be used) for the duration of the COVID-19 declaration under Section 564(b)(1) of the Act, 21 U.S.C. section 360bbb-3(b)(1), unless the authorization is terminated or revoked sooner.    Influenza A by PCR NEGATIVE NEGATIVE Final   Influenza B by PCR NEGATIVE NEGATIVE Final    Comment: (NOTE) The Xpert Xpress SARS-CoV-2/FLU/RSV assay is intended as an aid in  the diagnosis of influenza from Nasopharyngeal swab specimens and  should not be used as a sole basis for treatment. Nasal washings and  aspirates are unacceptable for Xpert Xpress SARS-CoV-2/FLU/RSV  testing. Fact Sheet for Patients: PinkCheek.be Fact  Sheet for Healthcare Providers: GravelBags.it This test is not yet approved or cleared by the [place] FDA and  has been authorized for detection and/or diagnosis of SARS-CoV-2 by  FDA under an Emergency Use Authorization (EUA). This EUA will remain  in effect (meaning this test can be used) for the duration of the  Covid-19 declaration under Section 564(b)(1) of the Act, 21  U.S.C. section 360bbb-3(b)(1), unless the authorization is  terminated or revoked. Performed at [facility], [address] [place], [place] [address]   MRSA PCR Screening     Status: None   Collection Time: 09/05/19  3:35 AM   Specimen: Nasal Mucosa; Nasopharyngeal  Result Value  Ref Range Status   MRSA by PCR NEGATIVE NEGATIVE Final    Comment:        The GeneXpert MRSA Assay (FDA approved for NASAL specimens only), is one component of a comprehensive MRSA colonization surveillance program. It is not intended to diagnose MRSA infection nor to guide or monitor treatment for MRSA infections. Performed at [facility], [address], [place], [place] [address]      Time coordinating discharge: Over 30 minutes  SIGNED:   Nolberto Hanlon, MD  [facility] 09/09/2019, 1:02 PM Pager   If 7PM-7AM, please contact night-coverage [email or web address] Password [email or web address]

## 2019-09-10 ENCOUNTER — Other Ambulatory Visit: Payer: Self-pay | Admitting: Oncology

## 2019-09-10 ENCOUNTER — Telehealth: Payer: Self-pay | Admitting: *Deleted

## 2019-09-10 NOTE — Telephone Encounter (Signed)
Patient requested to have her apts on Monday 09/14/19 moved to Friday, 5/7 due to multiple apts during the week. Dr. Grayland Ormond aware. apts moved per patient's request.

## 2019-09-11 NOTE — Progress Notes (Signed)
[place]  [phone]) [phone] Fax:(336) [phone]  ID: Heather Peterson OB: 17-Feb-1941  MR#: [id number]  KKX#:[id number]  [name], MD as PCP - General (Family Medicine) Lloyd Huger, MD as Consulting Physician (Oncology)  CHIEF COMPLAINT: Anemia, unspecified.  INTERVAL HISTORY: Patient returns to clinic today for further evaluation and initiation of Retacrit.  She was recently in the hospital with GI bleed requiring transfusion.  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  Her hemoglobin has improved and is now 9.0.  She continues to have chronic shortness of breath and requires oxygen 24 hours/day.  She has no neurologic complaints.  She denies any recent fevers.  She has a poor appetite, but has weight gain secondary to her fluid retention.  She denies any chest pain, cough, or hemoptysis.  She denies any nausea, vomiting, constipation, or diarrhea.  She denies any further hemoptysis or melena or hematochezia.  She has no urinary complaints.  Patient offers no further specific complaints today.  REVIEW OF SYSTEMS:   Review of Systems  Constitutional: Positive for malaise/fatigue. Negative for fever.  Respiratory: Positive for shortness of breath. Negative for cough and hemoptysis.   Cardiovascular: Positive for leg swelling. Negative for chest pain.  Gastrointestinal: Negative.  Negative for abdominal pain, melena and nausea.  Genitourinary: Negative.  Negative for dysuria and hematuria.  Musculoskeletal: Negative.  Negative for back pain.  Skin: Negative.  Negative for rash.  Neurological: Positive for weakness. Negative for dizziness, focal weakness and headaches.  Psychiatric/Behavioral: Negative.  The patient is not nervous/anxious.     As per HPI. Otherwise, a complete review of systems is negative.  PAST MEDICAL  HISTORY: Past Medical History:  Diagnosis Date  . Anxiety   . Chronic combined systolic and diastolic CHF (congestive heart failure) ([place])   . Chronic kidney disease   . Coronary artery disease   . Depression   . Diabetes mellitus without complication ([place])   . Diabetes mellitus, type II ([place])   . Hypertension   . MI (myocardial infarction) ([place])    x 5  . Pacemaker   . Prolonged Q-T interval on ECG   . Thyroid disease     PAST SURGICAL HISTORY: Past Surgical History:  Procedure Laterality Date  . CENTRAL LINE INSERTION  03/11/2017   Procedure: CENTRAL LINE INSERTION;  Surgeon: Leonie Man, MD;  Location: [place] [facility];  Service: Cardiovascular;;  . CHOLECYSTECTOMY    . COLONOSCOPY WITH PROPOFOL N/A 09/01/2019   Procedure: COLONOSCOPY WITH PROPOFOL;  Surgeon: Toledo, Benay Pike, MD;  Location: [facility] ENDOSCOPY;  Service: Gastroenterology;  Laterality: N/A;  . CORONARY STENT INTERVENTION W/IMPELLA N/A 03/11/2017   Procedure: Coronary Stent Intervention w/Impella;  Surgeon: Leonie Man, MD;  Location: [place] [facility];  Service: Cardiovascular;  Laterality: N/A;  . ESOPHAGOGASTRODUODENOSCOPY (EGD) WITH PROPOFOL N/A 09/01/2019   Procedure: ESOPHAGOGASTRODUODENOSCOPY (EGD) WITH PROPOFOL;  Surgeon: Toledo, Benay Pike, MD;  Location: [facility] ENDOSCOPY;  Service: Gastroenterology;  Laterality: N/A;  . ESOPHAGOGASTRODUODENOSCOPY (EGD) WITH PROPOFOL N/A 09/08/2019   Procedure: ESOPHAGOGASTRODUODENOSCOPY (EGD) WITH PROPOFOL;  Surgeon: [name], MD;  [facility]COPY;  Service: Gastroenterology;  Laterality: N/A;  . EYE SURGERY    . INTRAVASCULAR PRESSURE WIRE/FFR STUDY N/A 03/11/2017   Procedure: INTRAVASCULAR PRESSURE WIRE/FFR STUDY;  Surgeon: Leonie Man, MD;  Location: [place] [facility];  Service: Cardiovascular;  Laterality: N/A;  .  INTRAVASCULAR ULTRASOUND/IVUS N/A 03/11/2017   Procedure: Intravascular Ultrasound/IVUS;  Surgeon: Leonie Man, MD;  Location:  [place] [facility];  Service: Cardiovascular;  Laterality: N/A;  . LEFT HEART CATH AND CORONARY ANGIOGRAPHY N/A 03/05/2017   Procedure: LEFT HEART CATH AND CORONARY ANGIOGRAPHY;  Surgeon: Isaias Cowman, MD;  Location: [place] [facility];  Service: Cardiovascular;  Laterality: N/A;  . PACEMAKER IMPLANT    . pacemaker/defibrillator Left     FAMILY HISTORY: Family History  Problem Relation Age of Onset  . Hypertension Father   . Heart attack Father   . Depression Sister   . Depression Brother   . Depression Brother     ADVANCED DIRECTIVES (Y/N):  N  HEALTH MAINTENANCE: Social History   Tobacco Use  . Smoking status: Current Every Day Smoker    Packs/day: 0.25    Types: E-cigarettes, Cigarettes  . Smokeless tobacco: Never Used  Substance Use Topics  . Alcohol use: Not Currently    Comment: occasionally  . Drug use: No     Colonoscopy:  PAP:  Bone density:  Lipid panel:  Allergies  Allergen Reactions  . Celebrex [Celecoxib] Anaphylaxis  . Glipizide Anaphylaxis  . Lisinopril Swelling    Lip and facial swelling  . Sulfa Antibiotics Other (See Comments) and Anaphylaxis    Reaction: unknown  . Levaquin [Levofloxacin In D5w] Other (See Comments)    Heart arrhthymias  . Levofloxacin Other (See Comments)    ICD fired  . Metformin Other (See Comments)    Gi tolerance   . Penicillins Rash and Other (See Comments)    Has patient had a PCN reaction causing immediate rash, facial/tongue/throat swelling, SOB or lightheadedness with hypotension: Unknown Has patient had a PCN reaction causing severe rash involving mucus membranes or skin necrosis: No Has patient had a PCN reaction that required hospitalization: No Has patient had a PCN reaction occurring within the last 10 years: No If all of the above answers are "NO", then may proceed with Cephalosporin use.     Current Outpatient Medications  Medication Sig Dispense Refill  . acetaminophen (TYLENOL) 500 MG  tablet Take 500 mg by mouth every 4 (four) hours as needed for mild pain, fever or headache.     . albuterol (PROVENTIL HFA;VENTOLIN HFA) 108 (90 Base) MCG/ACT inhaler Inhale 2 puffs into the lungs every 4 (four) hours as needed for wheezing or shortness of breath. 1 Inhaler 1  . aspirin EC 81 MG tablet Take 81 mg by mouth at bedtime.     . fexofenadine (ALLEGRA) 180 MG tablet Take 180 mg by mouth daily.    Marland Kitchen FLUoxetine (PROZAC) 10 MG capsule Take 10 mg by mouth daily.    Marland Kitchen LANTUS SOLOSTAR 100 UNIT/ML Solostar Pen Inject 16 Units into the skin at bedtime.    Marland Kitchen levothyroxine (SYNTHROID) 50 MCG tablet Take 50 mcg by mouth See admin instructions. Take 1 tablet (9mg) by mouth every Tuesday, Thursday, Saturday and Sunday before breakfast    . levothyroxine (SYNTHROID, LEVOTHROID) 75 MCG tablet Take 75 mcg by mouth See admin instructions. Take 1 tablet (772m) by mouth every Monday, Wednesday and Friday morning before breakfast    . magnesium oxide (MAG-OX) 400 MG tablet Take 200 mg by mouth daily.    . Marland Kitchenexiletine (MEXITIL) 200 MG capsule Take 1 capsule (200 mg total) by mouth every 12 (twelve) hours. 60 capsule 1  . midodrine (PROAMATINE) 5 MG tablet Take 5 mg by mouth daily as needed.     .Marland Kitchen  OXYGEN Inhale 2 L into the lungs continuous.    . pantoprazole (PROTONIX) 40 MG tablet Take 40 mg by mouth 2 (two) times daily.     . Prenatal Vit-Fe Fumarate-FA (PRENATAL MULTIVITAMIN) TABS tablet Take 1 tablet by mouth daily.    . simvastatin (ZOCOR) 40 MG tablet Take 20 mg by mouth at bedtime.   2  . sodium chloride (OCEAN) 0.65 % SOLN nasal spray Place 1 spray into both nostrils as needed for congestion. 15 mL 0  . torsemide (DEMADEX) 20 MG tablet Take 2 tablets (40 mg total) by mouth daily. 60 tablet 5  . traZODone (DESYREL) 50 MG tablet Take 50 mg by mouth at bedtime.    . triamcinolone (NASACORT) 55 MCG/ACT AERO nasal inhaler Place 2 sprays into the nose 2 (two) times daily. 1 Inhaler 0   No current  facility-administered medications for this visit.   Facility-Administered Medications Ordered in Other Visits  Medication Dose Route Frequency Provider Last Rate Last Admin  . epoetin alfa-epbx (RETACRIT) injection 40,000 Units  40,000 Units Subcutaneous Once Lloyd Huger, MD        OBJECTIVE: Vitals:   09/17/19 1111  BP: 111/66  Pulse: 79  Resp: 20  Temp: (!) 96 F (35.6 C)  SpO2: 100%     Body mass index is 30.9 kg/m.    ECOG FS:2 - Symptomatic, <50% confined to bed  General: Well-developed, well-nourished, no acute distress. Eyes: Pink conjunctiva, anicteric sclera. HEENT: Normocephalic, moist mucous membranes. Lungs: No audible wheezing or coughing. Heart: Regular rate and rhythm. Abdomen: Soft, nontender, no obvious distention. Musculoskeletal: No edema, cyanosis, or clubbing. Neuro: Alert, answering all questions appropriately. Cranial nerves grossly intact. Skin: No rashes or petechiae noted. Psych: Normal affect.   LAB RESULTS:  Lab Results  Component Value Date   NA 139 09/09/2019   K 4.0 09/09/2019   CL 103 09/09/2019   CO2 28 09/09/2019   GLUCOSE 113 (H) 09/09/2019   BUN 22 09/09/2019   CREATININE 1.87 (H) 09/09/2019   CALCIUM 8.7 (L) 09/09/2019   PROT 6.6 09/04/2019   ALBUMIN 3.2 (L) 09/08/2019   AST 19 09/04/2019   ALT 10 09/04/2019   ALKPHOS 67 09/04/2019   BILITOT 1.0 09/04/2019   GFRNONAA 25 (L) 09/09/2019   GFRAA 30 (L) 09/09/2019    Lab Results  Component Value Date   WBC 6.0 09/09/2019   NEUTROABS 3.7 09/06/2019   HGB 9.0 (L) 09/17/2019   HCT 28.1 (L) 09/09/2019   MCV 97.2 09/09/2019   PLT 162 09/09/2019   Lab Results  Component Value Date   IRON 90 08/27/2019   TIBC 407 08/27/2019   IRONPCTSAT 22 08/27/2019   Lab Results  Component Value Date   FERRITIN 50 08/27/2019     STUDIES: DG Chest 2 View  Result Date: 08/27/2019 CLINICAL DATA:  Shortness of breath EXAM: CHEST - 2 VIEW COMPARISON:  07/16/2019 FINDINGS:  Cardiac shadow remains enlarged. Defibrillator is again seen. Stable vascular congestion is noted without significant interstitial edema. Small right-sided pleural effusion is noted. No focal infiltrate is seen. No bony abnormality is noted. IMPRESSION: Mild vascular congestion with small right-sided pleural effusion. No significant edema is noted. Electronically Signed   By: Inez Catalina M.D.   On: 08/27/2019 19:24   US Abdomen Limited  Result Date: 08/20/2019 CLINICAL DATA:  Ascites. EXAM: LIMITED ABDOMEN ULTRASOUND FOR ASCITES TECHNIQUE: Limited ultrasound survey for ascites was performed in all four abdominal quadrants. COMPARISON:  08/06/2019. FINDINGS:  Only a small amount of ascites noted on today's exam. Paracentesis was not performed. No other abnormality identified. Repeat exam can be obtained as needed. IMPRESSION: Small amount of ascites noted on today's exam. Paracentesis not performed. Repeat exam can be obtained as. Electronically Signed   By: Marcello Moores  Register   On: 08/20/2019 13:05    ASSESSMENT: Anemia, unspecified.  PLAN:    1.  Anemia, unspecified: Likely multifactorial given history of GI bleed as well as chronic renal insufficiency.  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  Patient's hemoglobin improved with blood transfusions.  She does not require transfusion or IV iron at this time, but this may be necessary in the future.  Given her ongoing chronic renal insufficiency and decreased hemoglobin below 10.0, she will benefit from 40,000 units Retacrit today.  Return to clinic monthly x3 for laboratory work and Retacrit only.  Patient will then return to clinic in 4 months with repeat laboratory work, further evaluation, and continuation of treatment if needed.  2.  Chronic renal insufficiency: Patient's most recent creatinine of 1.87 appears to be approximately her baseline. 3.  Ascites: Continue follow-up with heart failure  clinic as scheduled. 4.  Angiodysplastic lesions: Continue follow-up with GI as scheduled.  I spent a total of 30 minutes reviewing chart data, face-to-face evaluation with the patient, counseling and coordination of care as detailed above.   Patient expressed understanding and was in agreement with this plan. She also understands that She can call clinic at any time with any questions, concerns, or complaints.    Lloyd Huger, MD   09/17/2019 11:47 AM

## 2019-09-13 ENCOUNTER — Inpatient Hospital Stay: Payer: Medicare Other

## 2019-09-13 ENCOUNTER — Inpatient Hospital Stay: Payer: Medicare Other | Admitting: Oncology

## 2019-09-13 NOTE — Progress Notes (Signed)
Patient ID: Heather Peterson, female    DOB: 1940-07-15, 79 y.o.   MRN: [id number]  HPI  Ms Zemaitis is a 79 y/o female with a history of CAD, DM, HTN, CKD, thyroid disease, anxiety, depression, prolonged QT, current tobacco use and chronic heart failure.   Echo report from 04/20/2019 reviewed and showed an EF of 40% along with mild TR and moderate MR.  Catheterization done 03/11/17 showed:  Ost LM lesion, 50 %stenosed. - Eccentric Heavily Calcified lesion - MLA 7.5 mm2, FFR 0.93 - Not physiologically significant.  Prox LAD to Dist LAD Stented Segment, 0 %stenosed.  Dist LAD lesion, 90 %stenosed. Beyond Stent - Med Rx.  Prox Cx to Mid Cx Stent, 0 %stenosed.  Ost 2nd Mrg to 2nd Mrg lesion, 50 %stenosed. Beyond Stent  Admitted 09/04/19 due to GIB. GI consult obtained. 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 Received 4 units of blood. Discharged after 5 days. Was in the ED 08/27/19 but LWBS. Was in the ED 07/16/19 due to weakness. Given some IVF's and she was released. Admitted 07/02/19 due to acute kidney injury along with anemia. Cardiology and GI consults obtained. Diuretic held due to dehydration. Given 1 unit of PRBC's. Chronic prolonged QTc. Discharged after 3 days.   She presents today for a follow-up visit with a chief complaint of moderate shortness of breath upon minimal exertion. She describes this as chronic in nature having been present for years although does feel like it has improved some. She has associated fatigue, abdominal distention (much better), nause (when BP is low), dizziness and chronic difficulty sleeping along with this. She denies any palpitations, pedal edema, chest pain, cough or weight gain.    Diuretic has been changed to torsemide. Taking midodrine 1-2 times/ day based on SBP <100.   Past Medical History:  Diagnosis Date  . Anxiety   . Chronic combined systolic and diastolic CHF (congestive heart failure) ([place])   .  Chronic kidney disease   . Coronary artery disease   . Depression   . Diabetes mellitus without complication ([place])   . Diabetes mellitus, type II ([place])   . Hypertension   . MI (myocardial infarction) ([place])    x 5  . Pacemaker   . Prolonged Q-T interval on ECG   . Thyroid disease    Past Surgical History:  Procedure Laterality Date  . CENTRAL LINE INSERTION  03/11/2017   Procedure: CENTRAL LINE INSERTION;  Surgeon: Leonie Man, MD;  Location: [place] [facility];  Service: Cardiovascular;;  . CHOLECYSTECTOMY    . COLONOSCOPY WITH PROPOFOL N/A 09/01/2019   Procedure: COLONOSCOPY WITH PROPOFOL;  Surgeon: Toledo, Benay Pike, MD;  Location: [facility] ENDOSCOPY;  Service: Gastroenterology;  Laterality: N/A;  . CORONARY STENT INTERVENTION W/IMPELLA N/A 03/11/2017   Procedure: Coronary Stent Intervention w/Impella;  Surgeon: Leonie Man, MD;  Location: [place] [facility];  Service: Cardiovascular;  Laterality: N/A;  . ESOPHAGOGASTRODUODENOSCOPY (EGD) WITH PROPOFOL N/A 09/01/2019   Procedure: ESOPHAGOGASTRODUODENOSCOPY (EGD) WITH PROPOFOL;  Surgeon: Toledo, Benay Pike, MD;  Location: [facility] ENDOSCOPY;  Service: Gastroenterology;  Laterality: N/A;  . ESOPHAGOGASTRODUODENOSCOPY (EGD) WITH PROPOFOL N/A 09/08/2019   Procedure: ESOPHAGOGASTRODUODENOSCOPY (EGD) WITH PROPOFOL;  Surgeon: [name], MD;  Location: [facility] ENDOSCOPY;  Service: Gastroenterology;  Laterality: N/A;  . EYE SURGERY    . INTRAVASCULAR PRESSURE WIRE/FFR STUDY N/A 03/11/2017   Procedure: INTRAVASCULAR PRESSURE WIRE/FFR STUDY;  Surgeon: Leonie Man, MD;  Location: [place] [facility];  Service: Cardiovascular;  Laterality: N/A;  . INTRAVASCULAR ULTRASOUND/IVUS N/A 03/11/2017   Procedure: Intravascular Ultrasound/IVUS;  Surgeon: Leonie Man, MD;  Location: [place] [facility];  Service: Cardiovascular;  Laterality: N/A;  . LEFT HEART CATH AND CORONARY ANGIOGRAPHY N/A 03/05/2017   Procedure: LEFT HEART CATH AND CORONARY  ANGIOGRAPHY;  Surgeon: Isaias Cowman, MD;  Location: [place] [facility];  Service: Cardiovascular;  Laterality: N/A;  . PACEMAKER IMPLANT    . pacemaker/defibrillator Left    Family History  Problem Relation Age of Onset  . Hypertension Father   . Heart attack Father   . Depression Sister   . Depression Brother   . Depression Brother    Social History   Tobacco Use  . Smoking status: Current Every Day Smoker    Packs/day: 0.25    Types: E-cigarettes, Cigarettes  . Smokeless tobacco: Never Used  Substance Use Topics  . Alcohol use: Not Currently    Comment: occasionally   Allergies  Allergen Reactions  . Celebrex [Celecoxib] Anaphylaxis  . Glipizide Anaphylaxis  . Lisinopril Swelling    Lip and facial swelling  . Sulfa Antibiotics Other (See Comments) and Anaphylaxis    Reaction: unknown  . Levaquin [Levofloxacin In D5w] Other (See Comments)    Heart arrhthymias  . Levofloxacin Other (See Comments)    ICD fired  . Metformin Other (See Comments)    Gi tolerance   . Penicillins Rash and Other (See Comments)    Has patient had a PCN reaction causing immediate rash, facial/tongue/throat swelling, SOB or lightheadedness with hypotension: Unknown Has patient had a PCN reaction causing severe rash involving mucus membranes or skin necrosis: No Has patient had a PCN reaction that required hospitalization: No Has patient had a PCN reaction occurring within the last 10 years: No If all of the above answers are "NO", then may proceed with Cephalosporin use.    Prior to Admission medications   Medication Sig Start Date End Date Taking? Authorizing Provider  acetaminophen (TYLENOL) 500 MG tablet Take 500 mg by mouth every 4 (four) hours as needed for mild pain, fever or headache.    Yes [provider]  albuterol (PROVENTIL HFA;VENTOLIN HFA) 108 (90 Base) MCG/ACT inhaler Inhale 2 puffs into the lungs every 4 (four) hours as needed for wheezing or shortness of  breath. 02/05/17  Yes Gouru, Illene Silver, MD  aspirin EC 81 MG tablet Take 81 mg by mouth at bedtime.    Yes [provider]  fexofenadine (ALLEGRA) 180 MG tablet Take 180 mg by mouth daily.   Yes [provider]  FLUoxetine (PROZAC) 10 MG capsule Take 10 mg by mouth daily. 06/29/19  Yes [provider]  LANTUS SOLOSTAR 100 UNIT/ML Solostar Pen Inject 16 Units into the skin at bedtime. 06/09/19  Yes [provider]  levothyroxine (SYNTHROID) 50 MCG tablet Take 50 mcg by mouth See admin instructions. Take 1 tablet (72mg) by mouth every Tuesday, Thursday, Saturday and Sunday before breakfast   Yes [provider]  levothyroxine (SYNTHROID, LEVOTHROID) 75 MCG tablet Take 75 mcg by mouth See admin instructions. Take 1 tablet (716m) by mouth every Monday, Wednesday and Friday morning before breakfast   Yes [provider]  magnesium oxide (MAG-OX) 400 MG tablet Take 200 mg by mouth daily.   Yes [provider]  mexiletine (MEXITIL) 200 MG capsule Take 1 capsule (200 mg total) by mouth every 12 (twelve) hours. 03/14/17  Yes SePatsey BertholdNP  midodrine ([place]  5 MG tablet Take 5 mg by mouth daily as needed.    Yes [provider]  OXYGEN Inhale 2 L into the lungs continuous.   Yes [provider]  pantoprazole (PROTONIX) 40 MG tablet Take 40 mg by mouth 2 (two) times daily.  05/16/19  Yes [provider]  Prenatal Vit-Fe Fumarate-FA (PRENATAL MULTIVITAMIN) TABS tablet Take 1 tablet by mouth daily.   Yes [provider]  simvastatin (ZOCOR) 40 MG tablet Take 20 mg by mouth at bedtime.    Yes [provider]  sodium chloride (OCEAN) 0.65 % SOLN nasal spray Place 1 spray into both nostrils as needed for congestion. 07/05/19  Yes Fritzi Mandes, MD  torsemide (DEMADEX) 20 MG tablet Take 2 tablets (40 mg total) by mouth daily. 08/27/19 11/25/19 Yes Braedyn Riggle, Otila Kluver A, FNP  traZODone (DESYREL) 50 MG tablet Take 50 mg  by mouth at bedtime.   Yes [provider]  triamcinolone (NASACORT) 55 MCG/ACT AERO nasal inhaler Place 2 sprays into the nose 2 (two) times daily. 07/05/19  Yes Fritzi Mandes, MD     Review of Systems  Constitutional: Positive for appetite change (decreased) and fatigue.  HENT: Positive for congestion. Negative for postnasal drip and sore throat.   Eyes: Negative.   Respiratory: Positive for shortness of breath (easily). Negative for cough.   Cardiovascular: Negative for chest pain, palpitations and leg swelling.  Gastrointestinal: Positive for abdominal distention (improving) and nausea (at times with low BP). Negative for abdominal pain.  Endocrine: Negative.   Genitourinary: Negative.   Musculoskeletal: Positive for arthralgias (restless leg) and back pain.  Skin: Negative.   Allergic/Immunologic: Negative.   Neurological: Positive for dizziness. Negative for light-headedness.  Hematological: Negative for adenopathy. Does not bruise/bleed easily.  Psychiatric/Behavioral: Positive for sleep disturbance (wearing oxygen at 2L around the clock). Negative for dysphoric mood. The patient is not nervous/anxious.    Vitals:   09/14/19 1016  BP: 107/63  Pulse: 84  Resp: 18  SpO2: 100%  Weight: 171 lb (77.6 kg)  Height: 5' 4"  (1.626 m)   Wt Readings from Last 3 Encounters:  09/14/19 171 lb (77.6 kg)  09/08/19 190 lb (86.2 kg)  09/01/19 191 lb (86.6 kg)   Lab Results  Component Value Date   CREATININE 1.87 (H) 09/09/2019   CREATININE 2.08 (H) 09/08/2019   CREATININE 2.13 (H) 09/07/2019    Physical Exam Vitals and nursing note reviewed.  Constitutional:      Appearance: She is well-developed.  HENT:     Head: Normocephalic and atraumatic.  Neck:     Vascular: No JVD.  Cardiovascular:     Rate and Rhythm: Normal rate and regular rhythm.  Pulmonary:     Effort: Pulmonary effort is normal. No respiratory distress.     Breath sounds: No wheezing or rales.  Abdominal:      General: There is no distension.     Palpations: Abdomen is soft.     Tenderness: There is no abdominal tenderness.  Musculoskeletal:     Cervical back: Normal range of motion and neck supple.     Right lower leg: No tenderness. No edema.     Left lower leg: No tenderness. No edema.  Skin:    General: Skin is warm and dry.  Neurological:     General: No focal deficit present.     Mental Status: She is alert and oriented to person, place, and time.  Psychiatric:  Mood and Affect: Mood normal.        Behavior: Behavior normal.    Assessment & Plan:  1: Chronic heart failure with reduced ejection fraction- - NYHA class III - weighing daily; reminded her to call for an overnight weight gain of >2 pounds or a weekly weight gain of >5 pounds - weight down 25 pounds from last visit here 3 weeks ago - not adding salt and she was encouraged to read food labels for sodium content carefully - saw cardiology (Paraschos) 07/19/19 & returns later this week - paracentesis done 08/06/19 with resultant 1L removed; repeat on 08/20/19 without ascites - has ICD present & it has fired in the past - BNP 09/04/19 was 440.0 - swelling with lisinopril - PharmD reconciled medications with the patient - continues to smoke ~ 1/2 ppd of cigarettes but she removes her oxygen and goes outside the home to smoke  2: HTN- - BP looks good today although on the low side - home BP ranges from 111-80/63-48 since recent discharge; when SBP is <100, she takes midodrine - leery about decreasing torsemide due to issues with ascites - saw PCP (Feldpausch) 07/26/19 - BMP 09/09/19 reviewed and showed sodium 139, potassium 4.0, creatinine 1.87 and GFR 25  3: DM- - saw endocrinology Pasty Arch) 06/08/19 - A1c 09/06/19 was 5.5% - saw nephrology Maia Breslow) 07/28/19  4: Anemia- - Hemoglobin 09/09/19 was 8.9; received 4 units of blood during recent admission - saw GI Jacqulyn Liner) 08/30/19   Medication list was reviewed.    Return in 3 months or sooner for any questions/problems before then.

## 2019-09-14 ENCOUNTER — Encounter: Payer: Self-pay | Admitting: Family

## 2019-09-14 ENCOUNTER — Other Ambulatory Visit: Payer: Self-pay

## 2019-09-14 ENCOUNTER — Ambulatory Visit: Payer: Medicare Other | Attending: Family | Admitting: Family

## 2019-09-14 VITALS — BP 107/63 | HR 84 | Resp 18 | Ht 64.0 in | Wt 171.0 lb

## 2019-09-14 DIAGNOSIS — N189 Chronic kidney disease, unspecified: Secondary | ICD-10-CM | POA: Insufficient documentation

## 2019-09-14 DIAGNOSIS — Z888 Allergy status to other drugs, medicaments and biological substances status: Secondary | ICD-10-CM | POA: Insufficient documentation

## 2019-09-14 DIAGNOSIS — Z880 Allergy status to penicillin: Secondary | ICD-10-CM | POA: Diagnosis not present

## 2019-09-14 DIAGNOSIS — Z9981 Dependence on supplemental oxygen: Secondary | ICD-10-CM | POA: Diagnosis not present

## 2019-09-14 DIAGNOSIS — D649 Anemia, unspecified: Secondary | ICD-10-CM | POA: Diagnosis not present

## 2019-09-14 DIAGNOSIS — I5042 Chronic combined systolic (congestive) and diastolic (congestive) heart failure: Secondary | ICD-10-CM | POA: Insufficient documentation

## 2019-09-14 DIAGNOSIS — E1122 Type 2 diabetes mellitus with diabetic chronic kidney disease: Secondary | ICD-10-CM | POA: Diagnosis not present

## 2019-09-14 DIAGNOSIS — I5022 Chronic systolic (congestive) heart failure: Secondary | ICD-10-CM

## 2019-09-14 DIAGNOSIS — Z881 Allergy status to other antibiotic agents status: Secondary | ICD-10-CM | POA: Diagnosis not present

## 2019-09-14 DIAGNOSIS — Z955 Presence of coronary angioplasty implant and graft: Secondary | ICD-10-CM | POA: Insufficient documentation

## 2019-09-14 DIAGNOSIS — I252 Old myocardial infarction: Secondary | ICD-10-CM | POA: Diagnosis not present

## 2019-09-14 DIAGNOSIS — I10 Essential (primary) hypertension: Secondary | ICD-10-CM

## 2019-09-14 DIAGNOSIS — Z882 Allergy status to sulfonamides status: Secondary | ICD-10-CM | POA: Diagnosis not present

## 2019-09-14 DIAGNOSIS — Z8249 Family history of ischemic heart disease and other diseases of the circulatory system: Secondary | ICD-10-CM | POA: Diagnosis not present

## 2019-09-14 DIAGNOSIS — I2510 Atherosclerotic heart disease of native coronary artery without angina pectoris: Secondary | ICD-10-CM | POA: Insufficient documentation

## 2019-09-14 DIAGNOSIS — F17210 Nicotine dependence, cigarettes, uncomplicated: Secondary | ICD-10-CM | POA: Insufficient documentation

## 2019-09-14 DIAGNOSIS — Z79890 Hormone replacement therapy (postmenopausal): Secondary | ICD-10-CM | POA: Insufficient documentation

## 2019-09-14 DIAGNOSIS — E079 Disorder of thyroid, unspecified: Secondary | ICD-10-CM | POA: Diagnosis not present

## 2019-09-14 DIAGNOSIS — Z79899 Other long term (current) drug therapy: Secondary | ICD-10-CM | POA: Insufficient documentation

## 2019-09-14 DIAGNOSIS — Z886 Allergy status to analgesic agent status: Secondary | ICD-10-CM | POA: Insufficient documentation

## 2019-09-14 DIAGNOSIS — I130 Hypertensive heart and chronic kidney disease with heart failure and stage 1 through stage 4 chronic kidney disease, or unspecified chronic kidney disease: Secondary | ICD-10-CM | POA: Diagnosis not present

## 2019-09-14 DIAGNOSIS — F419 Anxiety disorder, unspecified: Secondary | ICD-10-CM | POA: Diagnosis not present

## 2019-09-14 DIAGNOSIS — Z794 Long term (current) use of insulin: Secondary | ICD-10-CM | POA: Diagnosis not present

## 2019-09-14 DIAGNOSIS — N184 Chronic kidney disease, stage 4 (severe): Secondary | ICD-10-CM

## 2019-09-14 DIAGNOSIS — Z7982 Long term (current) use of aspirin: Secondary | ICD-10-CM | POA: Insufficient documentation

## 2019-09-14 DIAGNOSIS — D631 Anemia in chronic kidney disease: Secondary | ICD-10-CM

## 2019-09-14 DIAGNOSIS — F329 Major depressive disorder, single episode, unspecified: Secondary | ICD-10-CM | POA: Diagnosis not present

## 2019-09-14 DIAGNOSIS — N1832 Chronic kidney disease, stage 3b: Secondary | ICD-10-CM

## 2019-09-14 DIAGNOSIS — Z95810 Presence of automatic (implantable) cardiac defibrillator: Secondary | ICD-10-CM | POA: Insufficient documentation

## 2019-09-14 NOTE — Progress Notes (Signed)
[place] - PHARMACIST COUNSELING NOTE  ADHERENCE ASSESSMENT  Adherence strategy: Patient endorses taking medications daily as prescribed   Do you ever forget to take your medication? [] Yes (1) [x] No (0)  Do you ever skip doses due to side effects? [] Yes (1) [x] No (0)  Do you have trouble affording your medicines? [x] Yes (1) [] No (0)  Are you ever unable to pick up your medication due to transportation difficulties? [] Yes (1) [x] No (0)  Do you ever stop taking your medications because you don't believe they are helping? [] Yes (1) [x] No (0)  Total score 1    Recommendations given to patient about increasing adherence: n/a  Guideline-Directed Medical Therapy/Evidence Based Medicine  ACE/ARB/ARNI: none Beta Blocker: none Aldosterone Antagonist: none Diuretic: torsemide 40 mg daily    SUBJECTIVE   Past Medical History:  Diagnosis Date  . Anxiety   . Chronic combined systolic and diastolic CHF (congestive heart failure) ([place])   . Chronic kidney disease   . Coronary artery disease   . Depression   . Diabetes mellitus without complication ([place])   . Diabetes mellitus, type II ([place])   . Hypertension   . MI (myocardial infarction) ([place])    x 5  . Pacemaker   . Prolonged Q-T interval on ECG   . Thyroid disease      OBJECTIVE   Vital signs: HR 84, BP 107/63, weight (pounds) 171 ECHO: Date 02/05/2017, EF 35-40%, notes grade 1 diastolic dysfunction. 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 Patient was recently hospitalized 4/24-4/29 for GIB. 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 Clopidogrel discontinued and patient started on PPI twice daily. Patient denies further signs/symptoms of bleeding.   GDMT for CHF limited by hypotension. Patient takes midodrine PRN for low blood pressure. Secondary prophylaxis with simvastatin + aspirin. Diabetes is controlled with most recent HgbA1c 5.5% on 09/06/2019.    PLAN -Continue current medication regimen -Continue lifestyle interventions including low salt diet   Time spent: 15 minutes  Ronan Resident 09/14/2019 10:46 AM    Current Outpatient Medications:  .  acetaminophen (TYLENOL) 500 MG tablet, Take 500 mg by mouth every 4 (four) hours as needed for mild pain, fever or headache. , Disp: , Rfl:  .  albuterol (PROVENTIL HFA;VENTOLIN HFA) 108 (90 Base) MCG/ACT inhaler, Inhale 2 puffs into the lungs every 4 (four) hours as needed for wheezing or shortness of breath., Disp: 1 Inhaler, Rfl: 1 .  aspirin EC 81 MG tablet, Take 81 mg by mouth at bedtime. , Disp: , Rfl:  .  fexofenadine (ALLEGRA) 180 MG tablet, Take 180 mg by mouth  daily., Disp: , Rfl:  .  FLUoxetine (PROZAC) 10 MG capsule, Take 10 mg by mouth daily., Disp: , Rfl:  .  LANTUS SOLOSTAR 100 UNIT/ML Solostar Pen, Inject 16 Units into the skin at bedtime., Disp: , Rfl:  .  levothyroxine (SYNTHROID) 50 MCG tablet, Take 50 mcg by mouth See admin instructions. Take 1 tablet (43mg) by mouth every Tuesday, Thursday, Saturday and Sunday before breakfast, Disp: , Rfl:  .  levothyroxine (SYNTHROID, LEVOTHROID) 75 MCG tablet, Take 75 mcg by mouth See admin instructions. Take 1  tablet (789m) by mouth every Monday, Wednesday and Friday morning before breakfast, Disp: , Rfl:  .  magnesium oxide (MAG-OX) 400 MG tablet, Take 200 mg by mouth daily., Disp: , Rfl:  .  mexiletine (MEXITIL) 200 MG capsule, Take 1 capsule (200 mg total) by mouth every 12 (twelve) hours., Disp: 60 capsule, Rfl: 1 .  midodrine (PROAMATINE) 5 MG tablet, Take 5 mg by mouth daily as needed. , Disp: , Rfl:  .  OXYGEN, Inhale 2 L into the lungs continuous., Disp: , Rfl:  .  pantoprazole (PROTONIX) 40 MG tablet, Take 40 mg by mouth 2 (two) times daily. , Disp: , Rfl:  .  Prenatal Vit-Fe Fumarate-FA (PRENATAL MULTIVITAMIN) TABS tablet, Take 1 tablet by mouth daily., Disp: , Rfl:  .  simvastatin (ZOCOR) 40 MG tablet, Take 20 mg by mouth at bedtime. , Disp: , Rfl: 2 .  sodium chloride (OCEAN) 0.65 % SOLN nasal spray, Place 1 spray into both nostrils as needed for congestion., Disp: 15 mL, Rfl: 0 .  torsemide (DEMADEX) 20 MG tablet, Take 2 tablets (40 mg total) by mouth daily., Disp: 60 tablet, Rfl: 5 .  traZODone (DESYREL) 50 MG tablet, Take 50 mg by mouth at bedtime., Disp: , Rfl:  .  triamcinolone (NASACORT) 55 MCG/ACT AERO nasal inhaler, Place 2 sprays into the nose 2 (two) times daily., Disp: 1 Inhaler, Rfl: 0 .  ferrous sulfate 324 MG TBEC, Take 324 mg by mouth daily., Disp: , Rfl:    COUNSELING POINTS/CLINICAL PEARLS   Torsemide  Side effects may include excessive urination.  Tell patient to report symptoms of ototoxicity.  Instruct patient to report lightheadedness or syncope.  Warn patient to avoid use of nonprescription NSAID products without first discussing it with their healthcare provider.   DRUGS TO AVOID IN HEART FAILURE  Drug or Class Mechanism  Analgesics . NSAIDs . COX-2 inhibitors . Glucocorticoids  Sodium and water retention, increased systemic vascular resistance, decreased response to diuretics   Diabetes Medications . Metformin . Thiazolidinediones o Rosiglitazone  (Avandia) o Pioglitazone (Actos) . DPP4 Inhibitors o Saxagliptin (Onglyza) o Sitagliptin (Januvia)   Lactic acidosis Possible calcium channel blockade   Unknown  Antiarrhythmics . Class I  o Flecainide o Disopyramide . Class III o Sotalol . Other o Dronedarone  Negative inotrope, proarrhythmic   Proarrhythmic, beta blockade  Negative inotrope  Antihypertensives . Alpha Blockers o Doxazosin . Calcium Channel Blockers o Diltiazem o Verapamil o Nifedipine . Central Alpha Adrenergics o Moxonidine . Peripheral Vasodilators o Minoxidil  Increases renin and aldosterone  Negative inotrope    Possible sympathetic withdrawal  Unknown  Anti-infective . Itraconazole . Amphotericin B  Negative inotrope Unknown  Hematologic . Anagrelide . Cilostazol   Possible inhibition of PD IV Inhibition of PD III causing arrhythmias  Neurologic/Psychiatric . Stimulants . Anti-Seizure Drugs o Carbamazepine o Pregabalin . Antidepressants o Tricyclics o Citalopram . Parkinsons o Bromocriptine o Pergolide o Pramipexole .  Antipsychotics o Clozapine . Antimigraine o Ergotamine o Methysergide . Appetite suppressants . Bipolar o Lithium  Peripheral alpha and beta agonist activity  Negative inotrope and chronotrope Calcium channel blockade  Negative inotrope, proarrhythmic Dose-dependent QT prolongation  Excessive serotonin activity/valvular damage Excessive serotonin activity/valvular damage Unknown  IgE mediated hypersensitivy, calcium channel blockade  Excessive serotonin activity/valvular damage Excessive serotonin activity/valvular damage Valvular damage  Direct myofibrillar degeneration, adrenergic stimulation  Antimalarials . Chloroquine . Hydroxychloroquine Intracellular inhibition of lysosomal enzymes  Urologic Agents . Alpha Blockers o Doxazosin o Prazosin o Tamsulosin o Terazosin  Increased renin and aldosterone  Adapted from Page RL, et  al. "Drugs That May Cause or Exacerbate Heart Failure: A Scientific Statement from the [place]." Circulation 2016; 007:H21-F75. DOI: 10.1161/CIR.0000000000000426   MEDICATION ADHERENCES TIPS AND STRATEGIES 1. Taking medication as prescribed improves patient outcomes in heart failure (reduces hospitalizations, improves symptoms, increases survival) 2. Side effects of medications can be managed by decreasing doses, switching agents, stopping drugs, or adding additional therapy. Please let someone in the [facility] know if you have having bothersome side effects so we can modify your regimen. Do not alter your medication regimen without talking to [place].  3. Medication reminders can help patients remember to take drugs on time. If you are missing or forgetting doses you can try linking behaviors, using pill boxes, or an electronic reminder like an alarm on your phone or an app. Some people can also get automated phone calls as medication reminders.

## 2019-09-14 NOTE — Patient Instructions (Signed)
Continue weighing daily and call for an overnight weight gain of > 2 pounds or a weekly weight gain of >5 pounds. 

## 2019-09-17 ENCOUNTER — Inpatient Hospital Stay (HOSPITAL_BASED_OUTPATIENT_CLINIC_OR_DEPARTMENT_OTHER): Payer: Medicare Other | Admitting: Oncology

## 2019-09-17 ENCOUNTER — Inpatient Hospital Stay: Payer: Medicare Other

## 2019-09-17 ENCOUNTER — Encounter: Payer: Self-pay | Admitting: Oncology

## 2019-09-17 ENCOUNTER — Inpatient Hospital Stay: Payer: Medicare Other | Attending: Oncology

## 2019-09-17 ENCOUNTER — Other Ambulatory Visit: Payer: Self-pay

## 2019-09-17 VITALS — BP 111/66 | HR 79 | Temp 96.0°F | Resp 20 | Wt 180.0 lb

## 2019-09-17 DIAGNOSIS — N189 Chronic kidney disease, unspecified: Secondary | ICD-10-CM | POA: Diagnosis not present

## 2019-09-17 DIAGNOSIS — R188 Other ascites: Secondary | ICD-10-CM | POA: Diagnosis not present

## 2019-09-17 DIAGNOSIS — D649 Anemia, unspecified: Secondary | ICD-10-CM

## 2019-09-17 DIAGNOSIS — F17210 Nicotine dependence, cigarettes, uncomplicated: Secondary | ICD-10-CM | POA: Diagnosis not present

## 2019-09-17 DIAGNOSIS — D631 Anemia in chronic kidney disease: Secondary | ICD-10-CM

## 2019-09-17 DIAGNOSIS — N1832 Chronic kidney disease, stage 3b: Secondary | ICD-10-CM

## 2019-09-17 LAB — HEMOGLOBIN: Hemoglobin: 9 g/dL — ABNORMAL LOW (ref 12.0–15.0)

## 2019-09-17 MED ORDER — EPOETIN ALFA-EPBX 40000 UNIT/ML IJ SOLN
40000.0000 [IU] | Freq: Once | INTRAMUSCULAR | Status: AC
  Administered 2019-09-17: 40000 [IU] via SUBCUTANEOUS
  Filled 2019-09-17: qty 1

## 2019-09-17 NOTE — Progress Notes (Signed)
Patient would like to discuss recent labs that were taken at [facility] in [place]  on Wednesday. State she was told hgb was higher than before. She states she is having some pain in feet but has appointment with podiatrist next week.

## 2019-09-27 ENCOUNTER — Telehealth: Payer: Self-pay | Admitting: *Deleted

## 2019-09-27 NOTE — Telephone Encounter (Signed)
She got retacrit and no it would not.

## 2019-09-27 NOTE — Telephone Encounter (Signed)
Patient called asking if the injection she received would cause problems with her joints. Please advise

## 2019-09-27 NOTE — Telephone Encounter (Signed)
Call returned to patient and advised of Dr Virgel Manifold response. She reports that her pain is traveling from joint to joint

## 2019-10-15 ENCOUNTER — Other Ambulatory Visit: Payer: Self-pay | Admitting: Emergency Medicine

## 2019-10-15 DIAGNOSIS — D649 Anemia, unspecified: Secondary | ICD-10-CM

## 2019-10-18 ENCOUNTER — Telehealth: Payer: Self-pay | Admitting: Oncology

## 2019-10-18 ENCOUNTER — Inpatient Hospital Stay: Payer: Medicare Other

## 2019-10-18 NOTE — Telephone Encounter (Signed)
Patient phoned on this date and stated that she would not have transportation to her appt today and needed to reschedule. Appt rescheduled to 10-21-19.

## 2019-10-21 ENCOUNTER — Inpatient Hospital Stay: Payer: Medicare Other

## 2019-10-21 ENCOUNTER — Inpatient Hospital Stay: Payer: Medicare Other | Attending: Oncology

## 2019-10-21 ENCOUNTER — Other Ambulatory Visit: Payer: Self-pay

## 2019-10-21 VITALS — BP 109/67 | HR 85

## 2019-10-21 DIAGNOSIS — D631 Anemia in chronic kidney disease: Secondary | ICD-10-CM | POA: Diagnosis present

## 2019-10-21 DIAGNOSIS — D649 Anemia, unspecified: Secondary | ICD-10-CM

## 2019-10-21 DIAGNOSIS — N189 Chronic kidney disease, unspecified: Secondary | ICD-10-CM | POA: Diagnosis not present

## 2019-10-21 DIAGNOSIS — N1832 Chronic kidney disease, stage 3b: Secondary | ICD-10-CM

## 2019-10-21 DIAGNOSIS — F17210 Nicotine dependence, cigarettes, uncomplicated: Secondary | ICD-10-CM | POA: Diagnosis not present

## 2019-10-21 DIAGNOSIS — R188 Other ascites: Secondary | ICD-10-CM | POA: Insufficient documentation

## 2019-10-21 LAB — CBC WITH DIFFERENTIAL/PLATELET
Abs Immature Granulocytes: 0.11 10*3/uL — ABNORMAL HIGH (ref 0.00–0.07)
Basophils Absolute: 0 10*3/uL (ref 0.0–0.1)
Basophils Relative: 1 %
Eosinophils Absolute: 0.1 10*3/uL (ref 0.0–0.5)
Eosinophils Relative: 1 %
HCT: 29 % — ABNORMAL LOW (ref 36.0–46.0)
Hemoglobin: 9.3 g/dL — ABNORMAL LOW (ref 12.0–15.0)
Immature Granulocytes: 1 %
Lymphocytes Relative: 19 %
Lymphs Abs: 1.6 10*3/uL (ref 0.7–4.0)
MCH: 30.9 pg (ref 26.0–34.0)
MCHC: 32.1 g/dL (ref 30.0–36.0)
MCV: 96.3 fL (ref 80.0–100.0)
Monocytes Absolute: 0.9 10*3/uL (ref 0.1–1.0)
Monocytes Relative: 10 %
Neutro Abs: 5.9 10*3/uL (ref 1.7–7.7)
Neutrophils Relative %: 68 %
Platelets: 189 10*3/uL (ref 150–400)
RBC: 3.01 MIL/uL — ABNORMAL LOW (ref 3.87–5.11)
RDW: 17.5 % — ABNORMAL HIGH (ref 11.5–15.5)
WBC: 8.7 10*3/uL (ref 4.0–10.5)
nRBC: 0 % (ref 0.0–0.2)

## 2019-10-21 LAB — IRON AND TIBC
Iron: 65 ug/dL (ref 28–170)
Saturation Ratios: 18 % (ref 10.4–31.8)
TIBC: 357 ug/dL (ref 250–450)
UIBC: 292 ug/dL

## 2019-10-21 LAB — FERRITIN: Ferritin: 44 ng/mL (ref 11–307)

## 2019-10-21 MED ORDER — EPOETIN ALFA-EPBX 40000 UNIT/ML IJ SOLN
40000.0000 [IU] | Freq: Once | INTRAMUSCULAR | Status: AC
  Administered 2019-10-21: 40000 [IU] via SUBCUTANEOUS
  Filled 2019-10-21: qty 1

## 2019-10-27 DIAGNOSIS — M109 Gout, unspecified: Secondary | ICD-10-CM | POA: Insufficient documentation

## 2019-11-17 ENCOUNTER — Inpatient Hospital Stay: Payer: Medicare Other | Attending: Oncology

## 2019-11-17 ENCOUNTER — Inpatient Hospital Stay: Payer: Medicare Other

## 2019-11-17 ENCOUNTER — Other Ambulatory Visit: Payer: Self-pay

## 2019-11-17 VITALS — BP 111/69 | HR 81

## 2019-11-17 DIAGNOSIS — D649 Anemia, unspecified: Secondary | ICD-10-CM

## 2019-11-17 DIAGNOSIS — N1832 Chronic kidney disease, stage 3b: Secondary | ICD-10-CM

## 2019-11-17 DIAGNOSIS — D631 Anemia in chronic kidney disease: Secondary | ICD-10-CM | POA: Diagnosis present

## 2019-11-17 DIAGNOSIS — N189 Chronic kidney disease, unspecified: Secondary | ICD-10-CM | POA: Diagnosis not present

## 2019-11-17 LAB — CBC WITH DIFFERENTIAL/PLATELET
Abs Immature Granulocytes: 0.11 10*3/uL — ABNORMAL HIGH (ref 0.00–0.07)
Basophils Absolute: 0.1 10*3/uL (ref 0.0–0.1)
Basophils Relative: 1 %
Eosinophils Absolute: 0.1 10*3/uL (ref 0.0–0.5)
Eosinophils Relative: 1 %
HCT: 26 % — ABNORMAL LOW (ref 36.0–46.0)
Hemoglobin: 8.4 g/dL — ABNORMAL LOW (ref 12.0–15.0)
Immature Granulocytes: 2 %
Lymphocytes Relative: 24 %
Lymphs Abs: 1.7 10*3/uL (ref 0.7–4.0)
MCH: 30.3 pg (ref 26.0–34.0)
MCHC: 32.3 g/dL (ref 30.0–36.0)
MCV: 93.9 fL (ref 80.0–100.0)
Monocytes Absolute: 0.8 10*3/uL (ref 0.1–1.0)
Monocytes Relative: 10 %
Neutro Abs: 4.6 10*3/uL (ref 1.7–7.7)
Neutrophils Relative %: 62 %
Platelets: 158 10*3/uL (ref 150–400)
RBC: 2.77 MIL/uL — ABNORMAL LOW (ref 3.87–5.11)
RDW: 17.8 % — ABNORMAL HIGH (ref 11.5–15.5)
WBC: 7.3 10*3/uL (ref 4.0–10.5)
nRBC: 0.3 % — ABNORMAL HIGH (ref 0.0–0.2)

## 2019-11-17 LAB — IRON AND TIBC
Iron: 103 ug/dL (ref 28–170)
Saturation Ratios: 29 % (ref 10.4–31.8)
TIBC: 358 ug/dL (ref 250–450)
UIBC: 255 ug/dL

## 2019-11-17 LAB — FERRITIN: Ferritin: 42 ng/mL (ref 11–307)

## 2019-11-17 MED ORDER — EPOETIN ALFA-EPBX 40000 UNIT/ML IJ SOLN
40000.0000 [IU] | Freq: Once | INTRAMUSCULAR | Status: AC
  Administered 2019-11-17: 40000 [IU] via SUBCUTANEOUS
  Filled 2019-11-17: qty 1

## 2019-12-13 ENCOUNTER — Inpatient Hospital Stay: Payer: Medicare Other

## 2019-12-13 ENCOUNTER — Other Ambulatory Visit: Payer: Self-pay

## 2019-12-13 ENCOUNTER — Inpatient Hospital Stay: Payer: Medicare Other | Attending: Oncology

## 2019-12-13 VITALS — BP 96/59 | HR 81

## 2019-12-13 DIAGNOSIS — E1122 Type 2 diabetes mellitus with diabetic chronic kidney disease: Secondary | ICD-10-CM | POA: Insufficient documentation

## 2019-12-13 DIAGNOSIS — Z9981 Dependence on supplemental oxygen: Secondary | ICD-10-CM | POA: Insufficient documentation

## 2019-12-13 DIAGNOSIS — F17210 Nicotine dependence, cigarettes, uncomplicated: Secondary | ICD-10-CM | POA: Insufficient documentation

## 2019-12-13 DIAGNOSIS — D631 Anemia in chronic kidney disease: Secondary | ICD-10-CM | POA: Insufficient documentation

## 2019-12-13 DIAGNOSIS — R531 Weakness: Secondary | ICD-10-CM | POA: Insufficient documentation

## 2019-12-13 DIAGNOSIS — Z79899 Other long term (current) drug therapy: Secondary | ICD-10-CM | POA: Insufficient documentation

## 2019-12-13 DIAGNOSIS — N1832 Chronic kidney disease, stage 3b: Secondary | ICD-10-CM

## 2019-12-13 DIAGNOSIS — D649 Anemia, unspecified: Secondary | ICD-10-CM

## 2019-12-13 DIAGNOSIS — I130 Hypertensive heart and chronic kidney disease with heart failure and stage 1 through stage 4 chronic kidney disease, or unspecified chronic kidney disease: Secondary | ICD-10-CM | POA: Diagnosis not present

## 2019-12-13 DIAGNOSIS — R5383 Other fatigue: Secondary | ICD-10-CM | POA: Diagnosis not present

## 2019-12-13 DIAGNOSIS — R0602 Shortness of breath: Secondary | ICD-10-CM | POA: Insufficient documentation

## 2019-12-13 DIAGNOSIS — M7989 Other specified soft tissue disorders: Secondary | ICD-10-CM | POA: Diagnosis not present

## 2019-12-13 DIAGNOSIS — N189 Chronic kidney disease, unspecified: Secondary | ICD-10-CM | POA: Diagnosis present

## 2019-12-13 LAB — CBC WITH DIFFERENTIAL/PLATELET
Abs Immature Granulocytes: 0.06 10*3/uL (ref 0.00–0.07)
Basophils Absolute: 0.1 10*3/uL (ref 0.0–0.1)
Basophils Relative: 1 %
Eosinophils Absolute: 0.1 10*3/uL (ref 0.0–0.5)
Eosinophils Relative: 1 %
HCT: 27 % — ABNORMAL LOW (ref 36.0–46.0)
Hemoglobin: 8.6 g/dL — ABNORMAL LOW (ref 12.0–15.0)
Immature Granulocytes: 1 %
Lymphocytes Relative: 26 %
Lymphs Abs: 1.8 10*3/uL (ref 0.7–4.0)
MCH: 29.9 pg (ref 26.0–34.0)
MCHC: 31.9 g/dL (ref 30.0–36.0)
MCV: 93.8 fL (ref 80.0–100.0)
Monocytes Absolute: 0.8 10*3/uL (ref 0.1–1.0)
Monocytes Relative: 12 %
Neutro Abs: 3.9 10*3/uL (ref 1.7–7.7)
Neutrophils Relative %: 59 %
Platelets: 150 10*3/uL (ref 150–400)
RBC: 2.88 MIL/uL — ABNORMAL LOW (ref 3.87–5.11)
RDW: 17.7 % — ABNORMAL HIGH (ref 11.5–15.5)
WBC: 6.7 10*3/uL (ref 4.0–10.5)
nRBC: 0 % (ref 0.0–0.2)

## 2019-12-13 LAB — IRON AND TIBC
Iron: 71 ug/dL (ref 28–170)
Saturation Ratios: 22 % (ref 10.4–31.8)
TIBC: 330 ug/dL (ref 250–450)
UIBC: 259 ug/dL

## 2019-12-13 LAB — FERRITIN: Ferritin: 36 ng/mL (ref 11–307)

## 2019-12-13 MED ORDER — EPOETIN ALFA-EPBX 40000 UNIT/ML IJ SOLN
40000.0000 [IU] | Freq: Once | INTRAMUSCULAR | Status: AC
  Administered 2019-12-13: 40000 [IU] via SUBCUTANEOUS
  Filled 2019-12-13: qty 1

## 2019-12-15 ENCOUNTER — Encounter: Payer: Self-pay | Admitting: Family

## 2019-12-15 ENCOUNTER — Ambulatory Visit: Payer: Medicare Other | Attending: Family | Admitting: Family

## 2019-12-15 ENCOUNTER — Other Ambulatory Visit: Payer: Self-pay

## 2019-12-15 VITALS — BP 108/59 | HR 82 | Resp 18 | Ht 64.0 in | Wt 172.0 lb

## 2019-12-15 DIAGNOSIS — E1122 Type 2 diabetes mellitus with diabetic chronic kidney disease: Secondary | ICD-10-CM | POA: Insufficient documentation

## 2019-12-15 DIAGNOSIS — N189 Chronic kidney disease, unspecified: Secondary | ICD-10-CM | POA: Insufficient documentation

## 2019-12-15 DIAGNOSIS — I130 Hypertensive heart and chronic kidney disease with heart failure and stage 1 through stage 4 chronic kidney disease, or unspecified chronic kidney disease: Secondary | ICD-10-CM | POA: Diagnosis not present

## 2019-12-15 DIAGNOSIS — R0602 Shortness of breath: Secondary | ICD-10-CM | POA: Diagnosis present

## 2019-12-15 DIAGNOSIS — D649 Anemia, unspecified: Secondary | ICD-10-CM | POA: Diagnosis not present

## 2019-12-15 DIAGNOSIS — M549 Dorsalgia, unspecified: Secondary | ICD-10-CM | POA: Diagnosis not present

## 2019-12-15 DIAGNOSIS — F329 Major depressive disorder, single episode, unspecified: Secondary | ICD-10-CM | POA: Diagnosis not present

## 2019-12-15 DIAGNOSIS — E079 Disorder of thyroid, unspecified: Secondary | ICD-10-CM | POA: Diagnosis not present

## 2019-12-15 DIAGNOSIS — I2510 Atherosclerotic heart disease of native coronary artery without angina pectoris: Secondary | ICD-10-CM | POA: Insufficient documentation

## 2019-12-15 DIAGNOSIS — F17210 Nicotine dependence, cigarettes, uncomplicated: Secondary | ICD-10-CM | POA: Diagnosis not present

## 2019-12-15 DIAGNOSIS — I5022 Chronic systolic (congestive) heart failure: Secondary | ICD-10-CM

## 2019-12-15 DIAGNOSIS — Z950 Presence of cardiac pacemaker: Secondary | ICD-10-CM | POA: Insufficient documentation

## 2019-12-15 DIAGNOSIS — I5042 Chronic combined systolic (congestive) and diastolic (congestive) heart failure: Secondary | ICD-10-CM | POA: Insufficient documentation

## 2019-12-15 DIAGNOSIS — N184 Chronic kidney disease, stage 4 (severe): Secondary | ICD-10-CM

## 2019-12-15 DIAGNOSIS — Z794 Long term (current) use of insulin: Secondary | ICD-10-CM | POA: Insufficient documentation

## 2019-12-15 DIAGNOSIS — Z7982 Long term (current) use of aspirin: Secondary | ICD-10-CM | POA: Diagnosis not present

## 2019-12-15 DIAGNOSIS — I252 Old myocardial infarction: Secondary | ICD-10-CM | POA: Diagnosis not present

## 2019-12-15 DIAGNOSIS — F419 Anxiety disorder, unspecified: Secondary | ICD-10-CM | POA: Diagnosis not present

## 2019-12-15 DIAGNOSIS — Z79899 Other long term (current) drug therapy: Secondary | ICD-10-CM | POA: Insufficient documentation

## 2019-12-15 DIAGNOSIS — Z8249 Family history of ischemic heart disease and other diseases of the circulatory system: Secondary | ICD-10-CM | POA: Insufficient documentation

## 2019-12-15 DIAGNOSIS — R42 Dizziness and giddiness: Secondary | ICD-10-CM | POA: Diagnosis not present

## 2019-12-15 DIAGNOSIS — Z955 Presence of coronary angioplasty implant and graft: Secondary | ICD-10-CM | POA: Diagnosis not present

## 2019-12-15 DIAGNOSIS — I10 Essential (primary) hypertension: Secondary | ICD-10-CM

## 2019-12-15 DIAGNOSIS — R140 Abdominal distension (gaseous): Secondary | ICD-10-CM | POA: Insufficient documentation

## 2019-12-15 DIAGNOSIS — D631 Anemia in chronic kidney disease: Secondary | ICD-10-CM

## 2019-12-15 NOTE — Patient Instructions (Addendum)
Continue weighing daily and call for an overnight weight gain of > 2 pounds or a weekly weight gain of >5 pounds.   Call us in the future if you'd like to schedule another appointment.

## 2019-12-15 NOTE — Progress Notes (Signed)
Patient ID: Heather Peterson, female    DOB: 04-15-1941, 79 y.o.   MRN: [id number]  HPI  Heather Peterson is a 79 y/o female with a history of CAD, DM, HTN, CKD, thyroid disease, anxiety, depression, prolonged QT, current tobacco use and chronic heart failure.   Echo report from 04/20/2019 reviewed and showed an EF of 40% along with mild TR and moderate MR.  Catheterization done 03/11/17 showed:  Ost LM lesion, 50 %stenosed. - Eccentric Heavily Calcified lesion - MLA 7.5 mm2, FFR 0.93 - Not physiologically significant.  Prox LAD to Dist LAD Stented Segment, 0 %stenosed.  Dist LAD lesion, 90 %stenosed. Beyond Stent - Med Rx.  Prox Cx to Mid Cx Stent, 0 %stenosed.  Ost 2nd Mrg to 2nd Mrg lesion, 50 %stenosed. Beyond Stent  Admitted 09/04/19 due to GIB. GI consult obtained. 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 Received 4 units of blood. Discharged after 5 days. Was in the ED 08/27/19 but LWBS. Was in the ED 07/16/19 due to weakness. Given some IVF's and she was released.   She presents today for a follow-up visit with a chief complaint of moderate shortness of breath upon moderate exertion. She describes this as chronic in nature having been present for several years. She has associated fatigue, abdominal distention (stable), back pain, intermittent dizziness and difficulty sleeping along with this. She denies any palpitations, pedal edema, chest pain or weight gain.   Past Medical History:  Diagnosis Date  . Anxiety   . Chronic combined systolic and diastolic CHF (congestive heart failure) ([place])   . Chronic kidney disease   . Coronary artery disease   . Depression   . Diabetes mellitus without complication ([place])   . Diabetes mellitus, type II ([place])   . Hypertension   . MI (myocardial infarction) ([place])    x 5  . Pacemaker   . Prolonged Q-T interval on ECG   . Thyroid disease    Past Surgical History:  Procedure Laterality Date  . CENTRAL  LINE INSERTION  03/11/2017   Procedure: CENTRAL LINE INSERTION;  Surgeon: Leonie Man, MD;  Location: [facility];  Service: Cardiovascular;;  . CHOLECYSTECTOMY    . COLONOSCOPY WITH PROPOFOL N/A 09/01/2019   Procedure: COLONOSCOPY WITH PROPOFOL;  Surgeon: Toledo, Benay Pike, MD;  Location: [facility] ENDOSCOPY;  Service: Gastroenterology;  Laterality: N/A;  . CORONARY STENT INTERVENTION W/IMPELLA N/A 03/11/2017   Procedure: Coronary Stent Intervention w/Impella;  Surgeon: Leonie Man, MD;  Location: [facility];  Service: Cardiovascular;  Laterality: N/A;  . ESOPHAGOGASTRODUODENOSCOPY (EGD) WITH PROPOFOL N/A 09/01/2019   Procedure: ESOPHAGOGASTRODUODENOSCOPY (EGD) WITH PROPOFOL;  Surgeon: Toledo, Benay Pike, MD;  Location: [facility] ENDOSCOPY;  Service: Gastroenterology;  Laterality: N/A;  . ESOPHAGOGASTRODUODENOSCOPY (EGD) WITH PROPOFOL N/A 09/08/2019   Procedure: ESOPHAGOGASTRODUODENOSCOPY (EGD) WITH PROPOFOL;  Surgeon: [name], MD;  Location: [facility] ENDOSCOPY;  Service: Gastroenterology;  Laterality: N/A;  . EYE SURGERY    . INTRAVASCULAR PRESSURE WIRE/FFR STUDY N/A 03/11/2017   Procedure: INTRAVASCULAR PRESSURE WIRE/FFR STUDY;  Surgeon: Leonie Man, MD;  Location: [place] [facility];  Service: Cardiovascular;  Laterality: N/A;  . INTRAVASCULAR ULTRASOUND/IVUS N/A 03/11/2017   Procedure: Intravascular Ultrasound/IVUS;  Surgeon: Leonie Man, MD;  Location: [facility];  Service: Cardiovascular;  Laterality: N/A;  . LEFT HEART CATH AND CORONARY ANGIOGRAPHY N/A 03/05/2017   Procedure: LEFT HEART CATH AND CORONARY ANGIOGRAPHY;  Surgeon: Isaias Cowman, MD;  [facility]  INVASIVE CV LAB;  Service: Cardiovascular;  Laterality: N/A;  . PACEMAKER IMPLANT    . pacemaker/defibrillator Left    Family History  Problem Relation Age of Onset  . Hypertension Father   . Heart attack Father   . Depression Sister   . Depression Brother   . Depression Brother     Social History   Tobacco Use  . Smoking status: Current Every Day Smoker    Packs/day: 0.25    Types: E-cigarettes, Cigarettes  . Smokeless tobacco: Never Used  Substance Use Topics  . Alcohol use: Not Currently    Comment: occasionally   Allergies  Allergen Reactions  . Celebrex [Celecoxib] Anaphylaxis  . Glipizide Anaphylaxis  . Lisinopril Swelling    Lip and facial swelling  . Sulfa Antibiotics Other (See Comments) and Anaphylaxis    Reaction: unknown  . Levaquin [Levofloxacin In D5w] Other (See Comments)    Heart arrhthymias  . Levofloxacin Other (See Comments)    ICD fired  . Metformin Other (See Comments)    Gi tolerance   . Penicillins Rash and Other (See Comments)    Has patient had a PCN reaction causing immediate rash, facial/tongue/throat swelling, SOB or lightheadedness with hypotension: Unknown Has patient had a PCN reaction causing severe rash involving mucus membranes or skin necrosis: No Has patient had a PCN reaction that required hospitalization: No Has patient had a PCN reaction occurring within the last 10 years: No If all of the above answers are "NO", then may proceed with Cephalosporin use.    Prior to Admission medications   Medication Sig Start Date End Date Taking? Authorizing Provider  acetaminophen (TYLENOL) 500 MG tablet Take 500 mg by mouth every 4 (four) hours as needed for mild pain, fever or headache.    Yes [provider]  albuterol (PROVENTIL HFA;VENTOLIN HFA) 108 (90 Base) MCG/ACT inhaler Inhale 2 puffs into the lungs every 4 (four) hours as needed for wheezing or shortness of breath. 02/05/17  Yes Gouru, Illene Silver, MD  aspirin EC 81 MG tablet Take 81 mg by mouth at bedtime.    Yes [provider]  fexofenadine (ALLEGRA) 180 MG tablet Take 180 mg by mouth daily.   Yes [provider]  FLUoxetine (PROZAC) 10 MG capsule Take 10 mg by mouth daily. 06/29/19  Yes [provider]  LANTUS SOLOSTAR 100 UNIT/ML  Solostar Pen Inject 16 Units into the skin at bedtime. 06/09/19  Yes [provider]  levothyroxine (SYNTHROID) 50 MCG tablet Take 50 mcg by mouth See admin instructions. Take 1 tablet (90mcg) by mouth every Tuesday, Thursday, Saturday and Sunday before breakfast   Yes [provider]  levothyroxine (SYNTHROID, LEVOTHROID) 75 MCG tablet Take 75 mcg by mouth See admin instructions. Take 1 tablet (69mcg) by mouth every Monday, Wednesday and Friday morning before breakfast   Yes [provider]  magnesium oxide (MAG-OX) 400 MG tablet Take 200 mg by mouth daily.   Yes [provider]  mexiletine (MEXITIL) 200 MG capsule Take 1 capsule (200 mg total) by mouth every 12 (twelve) hours. 03/14/17  Yes Seiler, Amber K, NP  midodrine (PROAMATINE) 5 MG tablet Take 5 mg by mouth daily as needed.    Yes [provider]  OXYGEN Inhale 2 L into the lungs continuous.   Yes [provider]  pantoprazole (PROTONIX) 40 MG tablet Take 40 mg by mouth 2 (two) times daily.  05/16/19  Yes [provider]  Prenatal Vit-Fe Fumarate-FA (PRENATAL MULTIVITAMIN)  TABS tablet Take 1 tablet by mouth daily.   Yes [provider]  simvastatin (ZOCOR) 40 MG tablet Take 20 mg by mouth at bedtime.    Yes [provider]  sodium chloride (OCEAN) 0.65 % SOLN nasal spray Place 1 spray into both nostrils as needed for congestion. 07/05/19  Yes Fritzi Mandes, MD  torsemide (DEMADEX) 20 MG tablet Take 2 tablets (40 mg total) by mouth daily. 08/27/19 12/15/19 Yes Sahara Fujimoto, Otila Kluver A, FNP  traZODone (DESYREL) 50 MG tablet Take 50 mg by mouth at bedtime.   Yes [provider]  triamcinolone (NASACORT) 55 MCG/ACT AERO nasal inhaler Place 2 sprays into the nose 2 (two) times daily. 07/05/19  Yes Fritzi Mandes, MD     Review of Systems  Constitutional: Positive for fatigue. Negative for appetite change.  HENT: Positive for congestion. Negative for postnasal drip and sore  throat.   Eyes: Negative.   Respiratory: Positive for shortness of breath (easily). Negative for cough.   Cardiovascular: Negative for chest pain, palpitations and leg swelling.  Gastrointestinal: Positive for abdominal distention and nausea (at times with low BP). Negative for abdominal pain.  Endocrine: Negative.   Genitourinary: Negative.   Musculoskeletal: Positive for arthralgias (restless leg) and back pain.  Skin: Negative.   Allergic/Immunologic: Negative.   Neurological: Positive for dizziness (at times). Negative for light-headedness.  Hematological: Negative for adenopathy. Does not bruise/bleed easily.  Psychiatric/Behavioral: Positive for sleep disturbance (wearing oxygen at 2L around the clock). Negative for dysphoric mood. The patient is not nervous/anxious.    Vitals:   12/15/19 1024  BP: (!) 108/59  Pulse: 82  Resp: 18  SpO2: 100%  Weight: 172 lb (78 kg)  Height: 5\' 4"  (1.626 m)   Wt Readings from Last 3 Encounters:  12/15/19 172 lb (78 kg)  09/17/19 180 lb (81.6 kg)  09/14/19 171 lb (77.6 kg)   Lab Results  Component Value Date   CREATININE 1.87 (H) 09/09/2019   CREATININE 2.08 (H) 09/08/2019   CREATININE 2.13 (H) 09/07/2019   Physical Exam Vitals and nursing note reviewed.  Constitutional:      Appearance: She is well-developed.  HENT:     Head: Normocephalic and atraumatic.  Neck:     Vascular: No JVD.  Cardiovascular:     Rate and Rhythm: Normal rate and regular rhythm.  Pulmonary:     Effort: Pulmonary effort is normal. No respiratory distress.     Breath sounds: No wheezing or rales.  Abdominal:     General: There is no distension.     Palpations: Abdomen is soft.     Tenderness: There is no abdominal tenderness.  Musculoskeletal:     Cervical back: Normal range of motion and neck supple.     Right lower leg: No tenderness. No edema.     Left lower leg: No tenderness. No edema.  Skin:    General: Skin is warm and dry.  Neurological:      General: No focal deficit present.     Mental Status: She is alert and oriented to person, place, and time.  Psychiatric:        Mood and Affect: Mood normal.        Behavior: Behavior normal.    Assessment & Plan:  1: Chronic heart failure with reduced ejection fraction- - NYHA class III - weighing daily; reminded her to call for an overnight weight gain of >2 pounds or a weekly weight gain of >5 pounds - weight stable  from last visit here 3 months ago - not adding salt and she was encouraged to read food labels for sodium content carefully - saw cardiology (Paraschos) 09/16/19 & returns 12/22/19 - paracentesis done 08/06/19 with resultant 1L removed; repeat on 08/20/19 without ascites - has ICD present & it has fired in the past - BNP 09/04/19 was 440.0 - swelling with lisinopril - continues to smoke ~ 1/2 ppd of cigarettes but she removes her oxygen and goes outside the home to smoke - currently getting weekly PT at home  2: HTN- - BP looks good today (108/59) - when SBP is <100, she takes midodrine - saw PCP (Feldpausch) 12/02/19 - BMP 12/02/19 reviewed and showed sodium 137, potassium 4.3, creatinine 2.2 and GFR 22  3: DM- - saw endocrinology Pasty Arch) 12/14/19 - A1c 12/02/19 was 5.5% - saw nephrology (Kolluru) 10/27/19 - glucose at home this morning was 140  4: Anemia- - Hemoglobin 12/13/19 was 8.6; epoetin injection done 12/13/19 - saw GI Jacqulyn Liner) 09/15/19   Patient did not bring her medications nor a list. Each medication was verbally reviewed with the patient and she was encouraged to bring the bottles to every visit to confirm accuracy of list.  Due to HF stability, will not make a return appointment for patient at this time. Advised patient that she could call at anytime to schedule another appointment and she was comfortable with that plan.

## 2019-12-23 ENCOUNTER — Other Ambulatory Visit: Payer: Self-pay

## 2019-12-23 DIAGNOSIS — N1832 Chronic kidney disease, stage 3b: Secondary | ICD-10-CM

## 2019-12-23 DIAGNOSIS — D631 Anemia in chronic kidney disease: Secondary | ICD-10-CM

## 2019-12-24 NOTE — Progress Notes (Signed)
[place]  [phone] [phone]  ID: Heather Peterson OB: 1941/01/04  MR#: [id number]  NOM#:[id number]  [name], MD as PCP - General (Family Medicine) Lloyd Huger, MD as Consulting Physician (Oncology)  CHIEF COMPLAINT: Anemia, unspecified.  INTERVAL HISTORY: Patient returns to clinic today as a patient requested add-on with complaints of persistent weakness and fatigue and wondering if her Retacrit "does not work".  She continues to have chronic shortness of breath and requires oxygen 24 hours/day. She has no neurologic complaints.  She denies any recent fevers.  She has a fair appetite.  She denies any chest pain, cough, or hemoptysis.  She denies any nausea, vomiting, constipation, or diarrhea.  She has no melena or hematochezia.  She has no urinary complaints.  Patient offers no further specific complaints today.  REVIEW OF SYSTEMS:   Review of Systems  Constitutional: Positive for malaise/fatigue. Negative for fever.  Respiratory: Positive for shortness of breath. Negative for cough and hemoptysis.   Cardiovascular: Positive for leg swelling. Negative for chest pain.  Gastrointestinal: Negative.  Negative for abdominal pain, melena and nausea.  Genitourinary: Negative.  Negative for dysuria and hematuria.  Musculoskeletal: Negative.  Negative for back pain.  Skin: Negative.  Negative for rash.  Neurological: Positive for weakness. Negative for dizziness, focal weakness and headaches.  Psychiatric/Behavioral: Negative.  The patient is not nervous/anxious.     As per HPI. Otherwise, a complete review of systems is negative.  PAST MEDICAL HISTORY: Past Medical History:  Diagnosis Date  . Anxiety   . Chronic combined systolic and diastolic CHF (congestive heart failure) ([place])   . Chronic kidney disease   . Coronary artery disease   . Depression   . Diabetes mellitus without complication ([place])   .  Diabetes mellitus, type II ([place])   . Hypertension   . MI (myocardial infarction) ([place])    x 5  . Pacemaker   . Prolonged Q-T interval on ECG   . Thyroid disease     PAST SURGICAL HISTORY: Past Surgical History:  Procedure Laterality Date  . CENTRAL LINE INSERTION  03/11/2017   Procedure: CENTRAL LINE INSERTION;  Surgeon: Leonie Man, MD;  Location: [place] [facility];  Service: Cardiovascular;;  . CHOLECYSTECTOMY    . COLONOSCOPY WITH PROPOFOL N/A 09/01/2019   Procedure: COLONOSCOPY WITH PROPOFOL;  Surgeon: Toledo, Benay Pike, MD;  Location: [facility] ENDOSCOPY;  Service: Gastroenterology;  Laterality: N/A;  . CORONARY STENT INTERVENTION W/IMPELLA N/A 03/11/2017   Procedure: Coronary Stent Intervention w/Impella;  Surgeon: Leonie Man, MD;  Location: [place] [facility];  Service: Cardiovascular;  Laterality: N/A;  . ESOPHAGOGASTRODUODENOSCOPY (EGD) WITH PROPOFOL N/A 09/01/2019   Procedure: ESOPHAGOGASTRODUODENOSCOPY (EGD) WITH PROPOFOL;  Surgeon: Toledo, Benay Pike, MD;  Location: [facility] ENDOSCOPY;  Service: Gastroenterology;  Laterality: N/A;  . ESOPHAGOGASTRODUODENOSCOPY (EGD) WITH PROPOFOL N/A 09/08/2019   Procedure: ESOPHAGOGASTRODUODENOSCOPY (EGD) WITH PROPOFOL;  Surgeon: [name], MD;  Location: [facility] ENDOSCOPY;  Service: Gastroenterology;  Laterality: N/A;  . EYE SURGERY    . INTRAVASCULAR PRESSURE WIRE/FFR STUDY N/A 03/11/2017   Procedure: INTRAVASCULAR PRESSURE WIRE/FFR STUDY;  Surgeon: Leonie Man, MD;  Location: [place] [facility];  Service: Cardiovascular;  Laterality: N/A;  . INTRAVASCULAR ULTRASOUND/IVUS N/A 03/11/2017   Procedure: Intravascular Ultrasound/IVUS;  Surgeon: Leonie Man, MD;  Location: [place] [facility];  Service: Cardiovascular;  Laterality: N/A;  . LEFT HEART CATH AND CORONARY ANGIOGRAPHY N/A 03/05/2017   Procedure: LEFT  HEART CATH AND CORONARY ANGIOGRAPHY;  Surgeon: Isaias Cowman, MD;  Location: [place] [facility];  Service:  Cardiovascular;  Laterality: N/A;  . PACEMAKER IMPLANT    . pacemaker/defibrillator Left     FAMILY HISTORY: Family History  Problem Relation Age of Onset  . Hypertension Father   . Heart attack Father   . Depression Sister   . Depression Brother   . Depression Brother     ADVANCED DIRECTIVES (Y/N):  N  HEALTH MAINTENANCE: Social History   Tobacco Use  . Smoking status: Current Every Day Smoker    Packs/day: 0.25    Types: E-cigarettes, Cigarettes  . Smokeless tobacco: Never Used  Vaping Use  . Vaping Use: Former  Substance Use Topics  . Alcohol use: Not Currently    Comment: occasionally  . Drug use: No     Colonoscopy:  PAP:  Bone density:  Lipid panel:  Allergies  Allergen Reactions  . Celebrex [Celecoxib] Anaphylaxis  . Glipizide Anaphylaxis  . Lisinopril Swelling    Lip and facial swelling  . Sulfa Antibiotics Other (See Comments) and Anaphylaxis    Reaction: unknown  . Levaquin [Levofloxacin In D5w] Other (See Comments)    Heart arrhthymias  . Levofloxacin Other (See Comments)    ICD fired  . Metformin Other (See Comments)    Gi tolerance   . Penicillins Rash and Other (See Comments)    Has patient had a PCN reaction causing immediate rash, facial/tongue/throat swelling, SOB or lightheadedness with hypotension: Unknown Has patient had a PCN reaction causing severe rash involving mucus membranes or skin necrosis: No Has patient had a PCN reaction that required hospitalization: No Has patient had a PCN reaction occurring within the last 10 years: No If all of the above answers are "NO", then may proceed with Cephalosporin use.     Current Outpatient Medications  Medication Sig Dispense Refill  . allopurinol (ZYLOPRIM) 100 MG tablet Take 50 mg by mouth daily.    Marland Kitchen aspirin EC 81 MG tablet Take 81 mg by mouth at bedtime.     . fexofenadine (ALLEGRA) 180 MG tablet Take 180 mg by mouth daily.    Marland Kitchen FLUoxetine (PROZAC) 10 MG capsule Take 10 mg by mouth  daily.    Marland Kitchen levothyroxine (SYNTHROID) 50 MCG tablet Take 50 mcg by mouth See admin instructions. Take 1 tablet (33mg) by mouth every Tuesday, Thursday, Saturday and Sunday before breakfast    . levothyroxine (SYNTHROID, LEVOTHROID) 75 MCG tablet Take 75 mcg by mouth See admin instructions. Take 1 tablet (73m) by mouth every Monday, Wednesday and Friday morning before breakfast    . magnesium oxide (MAG-OX) 400 MG tablet Take 200 mg by mouth daily.    . Marland Kitchenexiletine (MEXITIL) 200 MG capsule Take 1 capsule (200 mg total) by mouth every 12 (twelve) hours. 60 capsule 1  . midodrine (PROAMATINE) 5 MG tablet Take 5 mg by mouth daily as needed.     . OXYGEN Inhale 2 L into the lungs continuous.    . pantoprazole (PROTONIX) 40 MG tablet Take 40 mg by mouth daily.     . Prenatal Vit-Fe Fumarate-FA (PRENATAL MULTIVITAMIN) TABS tablet Take 1 tablet by mouth daily.    . simvastatin (ZOCOR) 40 MG tablet Take 20 mg by mouth at bedtime.   2  . sucralfate (CARAFATE) 1 g tablet Take 1 tablet by mouth 3 (three) times daily before meals.    . torsemide (DEMADEX) 20 MG tablet Take 2 tablets (40  mg total) by mouth daily. 60 tablet 5  . traZODone (DESYREL) 50 MG tablet Take 50 mg by mouth at bedtime.     No current facility-administered medications for this visit.    OBJECTIVE: Vitals:   12/28/19 1050  BP: (!) 108/51  Pulse: 82  Resp: 18  Temp: (!) 96.9 F (36.1 C)  SpO2: 100%     Body mass index is 29.46 kg/m.    ECOG FS:2 - Symptomatic, <50% confined to bed  General: Well-developed, well-nourished, no acute distress.  Sitting in a wheelchair. Eyes: Pink conjunctiva, anicteric sclera. HEENT: Normocephalic, moist mucous membranes. Lungs: No audible wheezing or coughing. Heart: Regular rate and rhythm. Abdomen: Soft, nontender, no obvious distention. Musculoskeletal: No edema, cyanosis, or clubbing. Neuro: Alert, answering all questions appropriately. Cranial nerves grossly intact. Skin: No rashes or  petechiae noted. Psych: Normal affect.  LAB RESULTS:  Lab Results  Component Value Date   NA 139 09/09/2019   K 4.0 09/09/2019   CL 103 09/09/2019   CO2 28 09/09/2019   GLUCOSE 113 (H) 09/09/2019   BUN 22 09/09/2019   CREATININE 1.87 (H) 09/09/2019   CALCIUM 8.7 (L) 09/09/2019   PROT 6.6 09/04/2019   ALBUMIN 3.2 (L) 09/08/2019   AST 19 09/04/2019   ALT 10 09/04/2019   ALKPHOS 67 09/04/2019   BILITOT 1.0 09/04/2019   GFRNONAA 25 (L) 09/09/2019   GFRAA 30 (L) 09/09/2019    Lab Results  Component Value Date   WBC 7.3 12/28/2019   NEUTROABS 4.3 12/28/2019   HGB 8.8 (L) 12/28/2019   HCT 27.1 (L) 12/28/2019   MCV 93.1 12/28/2019   PLT 198 12/28/2019   Lab Results  Component Value Date   IRON 86 12/28/2019   TIBC 347 12/28/2019   IRONPCTSAT 25 12/28/2019   Lab Results  Component Value Date   FERRITIN 47 12/28/2019     STUDIES: No results found.  ASSESSMENT: Anemia, unspecified.  PLAN:    1.  Anemia, unspecified: Likely multifactorial given history of GI bleed as well as chronic renal insufficiency.  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  Patient's hemoglobin improved with blood transfusions.  She does not require transfusion or IV iron at this time, but this may be necessary in the future.  Given her ongoing chronic renal insufficiency and decreased hemoglobin below 10.0, she will benefit from 40,000 units Retacrit today.  We also discussed the possibility of a bone marrow biopsy, but this is not necessary at this point.  Given her persistent fatigue, will increase frequency of injections.  Patient will return to clinic every 3 weeks for laboratory work and Retacrit.  She will then return to clinic in 3 months for further evaluation and continuation of treatment.   2.  Chronic renal insufficiency: Patient's most recent creatinine of 1.87 appears to be approximately her baseline.  She reports she has follow-up  with nephrology tomorrow. 3.  Ascites: Continue follow-up with heart failure clinic as scheduled. 4.  Angiodysplastic lesions: Continue follow-up with GI as scheduled.  I spent a total of 30 minutes reviewing chart data, face-to-face evaluation with the patient, counseling and coordination of care as detailed above.   Patient expressed understanding and was in agreement with this plan. She also understands that She can call clinic at any time with any questions, concerns, or complaints.    Lloyd Huger, MD   12/28/2019 12:09 PM

## 2019-12-28 ENCOUNTER — Inpatient Hospital Stay: Payer: Medicare Other

## 2019-12-28 ENCOUNTER — Inpatient Hospital Stay (HOSPITAL_BASED_OUTPATIENT_CLINIC_OR_DEPARTMENT_OTHER): Payer: Medicare Other | Admitting: Oncology

## 2019-12-28 ENCOUNTER — Other Ambulatory Visit: Payer: Self-pay

## 2019-12-28 ENCOUNTER — Encounter: Payer: Self-pay | Admitting: Oncology

## 2019-12-28 VITALS — BP 108/51 | HR 82 | Temp 96.9°F | Resp 18 | Wt 171.6 lb

## 2019-12-28 DIAGNOSIS — E1122 Type 2 diabetes mellitus with diabetic chronic kidney disease: Secondary | ICD-10-CM | POA: Diagnosis not present

## 2019-12-28 DIAGNOSIS — N1832 Chronic kidney disease, stage 3b: Secondary | ICD-10-CM

## 2019-12-28 DIAGNOSIS — D631 Anemia in chronic kidney disease: Secondary | ICD-10-CM

## 2019-12-28 LAB — IRON AND TIBC
Iron: 86 ug/dL (ref 28–170)
Saturation Ratios: 25 % (ref 10.4–31.8)
TIBC: 347 ug/dL (ref 250–450)
UIBC: 261 ug/dL

## 2019-12-28 LAB — CBC WITH DIFFERENTIAL/PLATELET
Abs Immature Granulocytes: 0.12 10*3/uL — ABNORMAL HIGH (ref 0.00–0.07)
Basophils Absolute: 0.1 10*3/uL (ref 0.0–0.1)
Basophils Relative: 1 %
Eosinophils Absolute: 0.1 10*3/uL (ref 0.0–0.5)
Eosinophils Relative: 1 %
HCT: 27.1 % — ABNORMAL LOW (ref 36.0–46.0)
Hemoglobin: 8.8 g/dL — ABNORMAL LOW (ref 12.0–15.0)
Immature Granulocytes: 2 %
Lymphocytes Relative: 27 %
Lymphs Abs: 2 10*3/uL (ref 0.7–4.0)
MCH: 30.2 pg (ref 26.0–34.0)
MCHC: 32.5 g/dL (ref 30.0–36.0)
MCV: 93.1 fL (ref 80.0–100.0)
Monocytes Absolute: 0.8 10*3/uL (ref 0.1–1.0)
Monocytes Relative: 10 %
Neutro Abs: 4.3 10*3/uL (ref 1.7–7.7)
Neutrophils Relative %: 59 %
Platelets: 198 10*3/uL (ref 150–400)
RBC: 2.91 MIL/uL — ABNORMAL LOW (ref 3.87–5.11)
RDW: 18.5 % — ABNORMAL HIGH (ref 11.5–15.5)
WBC: 7.3 10*3/uL (ref 4.0–10.5)
nRBC: 0.6 % — ABNORMAL HIGH (ref 0.0–0.2)

## 2019-12-28 LAB — FERRITIN: Ferritin: 47 ng/mL (ref 11–307)

## 2019-12-28 MED ORDER — EPOETIN ALFA-EPBX 40000 UNIT/ML IJ SOLN
40000.0000 [IU] | Freq: Once | INTRAMUSCULAR | Status: AC
  Administered 2019-12-28: 40000 [IU] via SUBCUTANEOUS
  Filled 2019-12-28: qty 1

## 2019-12-28 NOTE — Progress Notes (Signed)
Patient here today for follow up. Expresses concerns about blood count and feeling very tired and weak all the time. States she doesn't feel like injection is really helping her at this time. She is also having some aching in legs.

## 2020-01-18 ENCOUNTER — Inpatient Hospital Stay: Payer: Medicare Other

## 2020-01-18 ENCOUNTER — Other Ambulatory Visit: Payer: Self-pay

## 2020-01-18 ENCOUNTER — Inpatient Hospital Stay: Payer: Medicare Other | Attending: Oncology

## 2020-01-18 VITALS — BP 102/63 | HR 91

## 2020-01-18 DIAGNOSIS — D631 Anemia in chronic kidney disease: Secondary | ICD-10-CM | POA: Insufficient documentation

## 2020-01-18 DIAGNOSIS — F17210 Nicotine dependence, cigarettes, uncomplicated: Secondary | ICD-10-CM | POA: Diagnosis not present

## 2020-01-18 DIAGNOSIS — R188 Other ascites: Secondary | ICD-10-CM | POA: Diagnosis not present

## 2020-01-18 DIAGNOSIS — Z79899 Other long term (current) drug therapy: Secondary | ICD-10-CM | POA: Diagnosis not present

## 2020-01-18 DIAGNOSIS — N189 Chronic kidney disease, unspecified: Secondary | ICD-10-CM | POA: Insufficient documentation

## 2020-01-18 DIAGNOSIS — N1832 Chronic kidney disease, stage 3b: Secondary | ICD-10-CM

## 2020-01-18 DIAGNOSIS — D649 Anemia, unspecified: Secondary | ICD-10-CM

## 2020-01-18 LAB — CBC WITH DIFFERENTIAL/PLATELET
Abs Immature Granulocytes: 0.11 10*3/uL — ABNORMAL HIGH (ref 0.00–0.07)
Basophils Absolute: 0.1 10*3/uL (ref 0.0–0.1)
Basophils Relative: 1 %
Eosinophils Absolute: 0.2 10*3/uL (ref 0.0–0.5)
Eosinophils Relative: 2 %
HCT: 26.9 % — ABNORMAL LOW (ref 36.0–46.0)
Hemoglobin: 8.5 g/dL — ABNORMAL LOW (ref 12.0–15.0)
Immature Granulocytes: 1 %
Lymphocytes Relative: 23 %
Lymphs Abs: 2.1 10*3/uL (ref 0.7–4.0)
MCH: 29.8 pg (ref 26.0–34.0)
MCHC: 31.6 g/dL (ref 30.0–36.0)
MCV: 94.4 fL (ref 80.0–100.0)
Monocytes Absolute: 0.9 10*3/uL (ref 0.1–1.0)
Monocytes Relative: 10 %
Neutro Abs: 5.6 10*3/uL (ref 1.7–7.7)
Neutrophils Relative %: 63 %
Platelets: 199 10*3/uL (ref 150–400)
RBC: 2.85 MIL/uL — ABNORMAL LOW (ref 3.87–5.11)
RDW: 18.6 % — ABNORMAL HIGH (ref 11.5–15.5)
WBC: 9 10*3/uL (ref 4.0–10.5)
nRBC: 0.3 % — ABNORMAL HIGH (ref 0.0–0.2)

## 2020-01-18 LAB — FERRITIN: Ferritin: 23 ng/mL (ref 11–307)

## 2020-01-18 LAB — IRON AND TIBC
Iron: 233 ug/dL — ABNORMAL HIGH (ref 28–170)
Saturation Ratios: 53 % — ABNORMAL HIGH (ref 10.4–31.8)
TIBC: 440 ug/dL (ref 250–450)
UIBC: 207 ug/dL

## 2020-01-18 MED ORDER — EPOETIN ALFA-EPBX 40000 UNIT/ML IJ SOLN
40000.0000 [IU] | Freq: Once | INTRAMUSCULAR | Status: AC
  Administered 2020-01-18: 40000 [IU] via SUBCUTANEOUS
  Filled 2020-01-18: qty 1

## 2020-01-20 DIAGNOSIS — M79645 Pain in left finger(s): Secondary | ICD-10-CM | POA: Insufficient documentation

## 2020-01-20 DIAGNOSIS — M869 Osteomyelitis, unspecified: Secondary | ICD-10-CM | POA: Insufficient documentation

## 2020-01-21 ENCOUNTER — Ambulatory Visit: Payer: Medicare Other

## 2020-01-21 ENCOUNTER — Ambulatory Visit: Payer: Medicare Other | Admitting: Oncology

## 2020-01-21 ENCOUNTER — Other Ambulatory Visit: Payer: Medicare Other

## 2020-02-08 ENCOUNTER — Inpatient Hospital Stay: Payer: Medicare Other

## 2020-02-08 ENCOUNTER — Other Ambulatory Visit: Payer: Self-pay

## 2020-02-08 ENCOUNTER — Other Ambulatory Visit: Payer: Self-pay | Admitting: *Deleted

## 2020-02-08 VITALS — BP 103/55 | HR 83

## 2020-02-08 DIAGNOSIS — D631 Anemia in chronic kidney disease: Secondary | ICD-10-CM

## 2020-02-08 DIAGNOSIS — N189 Chronic kidney disease, unspecified: Secondary | ICD-10-CM | POA: Diagnosis not present

## 2020-02-08 DIAGNOSIS — D649 Anemia, unspecified: Secondary | ICD-10-CM

## 2020-02-08 DIAGNOSIS — N1832 Chronic kidney disease, stage 3b: Secondary | ICD-10-CM

## 2020-02-08 LAB — CBC WITH DIFFERENTIAL/PLATELET
Abs Immature Granulocytes: 0.07 10*3/uL (ref 0.00–0.07)
Basophils Absolute: 0.1 10*3/uL (ref 0.0–0.1)
Basophils Relative: 1 %
Eosinophils Absolute: 0.2 10*3/uL (ref 0.0–0.5)
Eosinophils Relative: 2 %
HCT: 24.8 % — ABNORMAL LOW (ref 36.0–46.0)
Hemoglobin: 7.9 g/dL — ABNORMAL LOW (ref 12.0–15.0)
Immature Granulocytes: 1 %
Lymphocytes Relative: 23 %
Lymphs Abs: 1.9 10*3/uL (ref 0.7–4.0)
MCH: 30 pg (ref 26.0–34.0)
MCHC: 31.9 g/dL (ref 30.0–36.0)
MCV: 94.3 fL (ref 80.0–100.0)
Monocytes Absolute: 0.9 10*3/uL (ref 0.1–1.0)
Monocytes Relative: 11 %
Neutro Abs: 5.2 10*3/uL (ref 1.7–7.7)
Neutrophils Relative %: 62 %
Platelets: 211 10*3/uL (ref 150–400)
RBC: 2.63 MIL/uL — ABNORMAL LOW (ref 3.87–5.11)
RDW: 19.4 % — ABNORMAL HIGH (ref 11.5–15.5)
WBC: 8.3 10*3/uL (ref 4.0–10.5)
nRBC: 0.4 % — ABNORMAL HIGH (ref 0.0–0.2)

## 2020-02-08 LAB — IRON AND TIBC
Iron: 104 ug/dL (ref 28–170)
Saturation Ratios: 26 % (ref 10.4–31.8)
TIBC: 405 ug/dL (ref 250–450)
UIBC: 301 ug/dL

## 2020-02-08 LAB — FERRITIN: Ferritin: 26 ng/mL (ref 11–307)

## 2020-02-08 MED ORDER — EPOETIN ALFA-EPBX 40000 UNIT/ML IJ SOLN
40000.0000 [IU] | Freq: Once | INTRAMUSCULAR | Status: AC
  Administered 2020-02-08: 40000 [IU] via SUBCUTANEOUS
  Filled 2020-02-08: qty 1

## 2020-02-14 ENCOUNTER — Telehealth: Payer: Self-pay | Admitting: *Deleted

## 2020-02-14 NOTE — Telephone Encounter (Signed)
CT guided bone marrow biopsy scheduled for 10/11 at 8:30 am, patient to arrive at 7:30 am. Patient notified of appointment for bone marrow biopsy. MD f/u scheduled for 10/19. Patient verbalized understanding of appointments.

## 2020-02-17 NOTE — Progress Notes (Signed)
Patient on schedule for BMB 02/21/2020. Spoke with patient on phone with pre procedure instructions given, made aware to be here @ 0730, NPO after MN prior to procedure as well as Driver for discharge after recovery, only take half dose insulin hs prior to procedure since NPO after MN,stated understanding.

## 2020-02-18 ENCOUNTER — Other Ambulatory Visit: Payer: Self-pay | Admitting: Radiology

## 2020-02-21 ENCOUNTER — Other Ambulatory Visit: Payer: Self-pay | Admitting: Family

## 2020-02-21 ENCOUNTER — Ambulatory Visit
Admission: RE | Admit: 2020-02-21 | Discharge: 2020-02-21 | Disposition: A | Discharge status: Home / Self Care | Payer: Medicare Other | Source: Ambulatory Visit | Attending: Oncology | Admitting: Oncology

## 2020-02-21 ENCOUNTER — Other Ambulatory Visit: Payer: Self-pay

## 2020-02-21 DIAGNOSIS — Z950 Presence of cardiac pacemaker: Secondary | ICD-10-CM | POA: Insufficient documentation

## 2020-02-21 DIAGNOSIS — E1122 Type 2 diabetes mellitus with diabetic chronic kidney disease: Secondary | ICD-10-CM | POA: Insufficient documentation

## 2020-02-21 DIAGNOSIS — C9000 Multiple myeloma not having achieved remission: Secondary | ICD-10-CM | POA: Insufficient documentation

## 2020-02-21 DIAGNOSIS — Z7982 Long term (current) use of aspirin: Secondary | ICD-10-CM | POA: Diagnosis not present

## 2020-02-21 DIAGNOSIS — Z9981 Dependence on supplemental oxygen: Secondary | ICD-10-CM | POA: Diagnosis not present

## 2020-02-21 DIAGNOSIS — Z8249 Family history of ischemic heart disease and other diseases of the circulatory system: Secondary | ICD-10-CM | POA: Diagnosis not present

## 2020-02-21 DIAGNOSIS — I130 Hypertensive heart and chronic kidney disease with heart failure and stage 1 through stage 4 chronic kidney disease, or unspecified chronic kidney disease: Secondary | ICD-10-CM | POA: Diagnosis not present

## 2020-02-21 DIAGNOSIS — I5042 Chronic combined systolic (congestive) and diastolic (congestive) heart failure: Secondary | ICD-10-CM | POA: Diagnosis not present

## 2020-02-21 DIAGNOSIS — I252 Old myocardial infarction: Secondary | ICD-10-CM | POA: Diagnosis not present

## 2020-02-21 DIAGNOSIS — Z79899 Other long term (current) drug therapy: Secondary | ICD-10-CM | POA: Diagnosis not present

## 2020-02-21 DIAGNOSIS — Z79890 Hormone replacement therapy (postmenopausal): Secondary | ICD-10-CM | POA: Diagnosis not present

## 2020-02-21 DIAGNOSIS — F17210 Nicotine dependence, cigarettes, uncomplicated: Secondary | ICD-10-CM | POA: Insufficient documentation

## 2020-02-21 DIAGNOSIS — D649 Anemia, unspecified: Secondary | ICD-10-CM | POA: Diagnosis present

## 2020-02-21 DIAGNOSIS — N189 Chronic kidney disease, unspecified: Secondary | ICD-10-CM | POA: Diagnosis not present

## 2020-02-21 LAB — CBC WITH DIFFERENTIAL/PLATELET
Abs Immature Granulocytes: 0.06 10*3/uL (ref 0.00–0.07)
Basophils Absolute: 0.1 10*3/uL (ref 0.0–0.1)
Basophils Relative: 1 %
Eosinophils Absolute: 0.1 10*3/uL (ref 0.0–0.5)
Eosinophils Relative: 2 %
HCT: 25.9 % — ABNORMAL LOW (ref 36.0–46.0)
Hemoglobin: 7.9 g/dL — ABNORMAL LOW (ref 12.0–15.0)
Immature Granulocytes: 1 %
Lymphocytes Relative: 23 %
Lymphs Abs: 1.6 10*3/uL (ref 0.7–4.0)
MCH: 29.9 pg (ref 26.0–34.0)
MCHC: 30.5 g/dL (ref 30.0–36.0)
MCV: 98.1 fL (ref 80.0–100.0)
Monocytes Absolute: 0.8 10*3/uL (ref 0.1–1.0)
Monocytes Relative: 12 %
Neutro Abs: 4.2 10*3/uL (ref 1.7–7.7)
Neutrophils Relative %: 61 %
Platelets: 193 10*3/uL (ref 150–400)
RBC: 2.64 MIL/uL — ABNORMAL LOW (ref 3.87–5.11)
RDW: 19.8 % — ABNORMAL HIGH (ref 11.5–15.5)
WBC: 6.8 10*3/uL (ref 4.0–10.5)
nRBC: 0 % (ref 0.0–0.2)

## 2020-02-21 LAB — GLUCOSE, CAPILLARY: Glucose-Capillary: 101 mg/dL — ABNORMAL HIGH (ref 70–99)

## 2020-02-21 MED ORDER — MIDAZOLAM HCL 2 MG/2ML IJ SOLN
INTRAMUSCULAR | Status: AC | PRN
  Administered 2020-02-21: 1 mg via INTRAVENOUS

## 2020-02-21 MED ORDER — FENTANYL CITRATE (PF) 100 MCG/2ML IJ SOLN
INTRAMUSCULAR | Status: AC
  Filled 2020-02-21: qty 2

## 2020-02-21 MED ORDER — HEPARIN SOD (PORK) LOCK FLUSH 100 UNIT/ML IV SOLN
INTRAVENOUS | Status: AC
  Filled 2020-02-21: qty 5

## 2020-02-21 MED ORDER — MIDAZOLAM HCL 2 MG/2ML IJ SOLN
INTRAMUSCULAR | Status: AC
  Filled 2020-02-21: qty 2

## 2020-02-21 MED ORDER — FENTANYL CITRATE (PF) 100 MCG/2ML IJ SOLN
INTRAMUSCULAR | Status: AC | PRN
  Administered 2020-02-21: 50 ug via INTRAVENOUS

## 2020-02-21 MED ORDER — SODIUM CHLORIDE 0.9 % IV SOLN: INTRAVENOUS | Status: DC

## 2020-02-21 NOTE — Progress Notes (Signed)
Patient clinically stable post BMB per Dr Serafina Royals, awake/alert and oriented post procedure. Denies complaints at this time. Vitals stable. Received Versed 1 mg along with Fentanyl 50 mcg IV for procedure. bandade to sacral area dry and intact. Report given to Humberto Seals in specials post procedure.

## 2020-02-21 NOTE — H&P (Signed)
Chief Complaint: Patient was seen in consultation today for CT guided bone marrow biopsy at the request of Finnegan,Timothy J  Referring Physician(s): Finnegan,Timothy J  Patient Status: [facility] - Out-pt  History of Present Illness: Heather Peterson is a 79 y.o. female with history of anemia and fatigue who presents today for bone marrow biopsy for further workup of anemia.  Feels well today with no fevers, chills, nausea, vomiting.  Past Medical History:  Diagnosis Date  . Anxiety   . Chronic combined systolic and diastolic CHF (congestive heart failure) ([place])   . Chronic kidney disease   . Coronary artery disease   . Depression   . Diabetes mellitus without complication ([place])   . Diabetes mellitus, type II ([place])   . Hypertension   . MI (myocardial infarction) ([place])    x 5  . Pacemaker   . Prolonged Q-T interval on ECG   . Thyroid disease     Past Surgical History:  Procedure Laterality Date  . CENTRAL LINE INSERTION  03/11/2017   Procedure: CENTRAL LINE INSERTION;  Surgeon: Leonie Man, MD;  Location: [place] [facility];  Service: Cardiovascular;;  . CHOLECYSTECTOMY    . COLONOSCOPY WITH PROPOFOL N/A 09/01/2019   Procedure: COLONOSCOPY WITH PROPOFOL;  Surgeon: Toledo, Benay Pike, MD;  Location: [facility] ENDOSCOPY;  Service: Gastroenterology;  Laterality: N/A;  . CORONARY STENT INTERVENTION W/IMPELLA N/A 03/11/2017   Procedure: Coronary Stent Intervention w/Impella;  Surgeon: Leonie Man, MD;  Location: [place] [facility];  Service: Cardiovascular;  Laterality: N/A;  . ESOPHAGOGASTRODUODENOSCOPY (EGD) WITH PROPOFOL N/A 09/01/2019   Procedure: ESOPHAGOGASTRODUODENOSCOPY (EGD) WITH PROPOFOL;  Surgeon: Toledo, Benay Pike, MD;  Location: [facility] ENDOSCOPY;  Service: Gastroenterology;  Laterality: N/A;  . ESOPHAGOGASTRODUODENOSCOPY (EGD) WITH PROPOFOL N/A 09/08/2019   Procedure: ESOPHAGOGASTRODUODENOSCOPY (EGD) WITH PROPOFOL;  Surgeon: [name], MD;  Location: [facility]  ENDOSCOPY;  Service: Gastroenterology;  Laterality: N/A;  . EYE SURGERY    . INTRAVASCULAR PRESSURE WIRE/FFR STUDY N/A 03/11/2017   Procedure: INTRAVASCULAR PRESSURE WIRE/FFR STUDY;  Surgeon: Leonie Man, MD;  Location: [place] [facility] LAB;  Service: Cardiovascular;  Laterality: N/A;  . INTRAVASCULAR ULTRASOUND/IVUS N/A 03/11/2017   Procedure: Intravascular Ultrasound/IVUS;  Surgeon: Leonie Man, MD;  Location: [place] [facility] LAB;  Service: Cardiovascular;  Laterality: N/A;  . LEFT HEART CATH AND CORONARY ANGIOGRAPHY N/A 03/05/2017   Procedure: LEFT HEART CATH AND CORONARY ANGIOGRAPHY;  Surgeon: Isaias Cowman, MD;  Location: [place] [facility] LAB;  Service: Cardiovascular;  Laterality: N/A;  . PACEMAKER IMPLANT    . pacemaker/defibrillator Left     Allergies: Celebrex [celecoxib], Glipizide, Lisinopril, Sulfa antibiotics, Levaquin [levofloxacin in d5w], Levofloxacin, Metformin, and Penicillins  Medications: Prior to Admission medications   Medication Sig Start Date End Date Taking? Authorizing Provider  allopurinol (ZYLOPRIM) 100 MG tablet Take 50 mg by mouth daily.   Yes [provider]  aspirin EC 81 MG tablet Take 81 mg by mouth at bedtime.    Yes [provider]  fexofenadine (ALLEGRA) 180 MG tablet Take 180 mg by mouth daily.   Yes [provider]  FLUoxetine (PROZAC) 10 MG capsule Take 10 mg by mouth daily. 06/29/19  Yes [provider]  levothyroxine (SYNTHROID) 50 MCG tablet Take 50 mcg by mouth See admin instructions. Take 1 tablet (74mg) by mouth every Tuesday, Thursday, Saturday and Sunday before breakfast   Yes [provider]  levothyroxine (SYNTHROID, LEVOTHROID) 75 MCG tablet Take 75 mcg by mouth See admin  instructions. Take 1 tablet (26mg) by mouth every Monday, Wednesday and Friday morning before breakfast   Yes [provider]  magnesium oxide (MAG-OX) 400 MG tablet Take 200 mg by mouth daily.   Yes  [provider]  mexiletine (MEXITIL) 200 MG capsule Take 1 capsule (200 mg total) by mouth every 12 (twelve) hours. 03/14/17  Yes Seiler, Amber K, NP  midodrine (PROAMATINE) 5 MG tablet Take 5 mg by mouth daily as needed.    Yes [provider]  OXYGEN Inhale 2 L into the lungs continuous.   Yes [provider]  pantoprazole (PROTONIX) 40 MG tablet Take 40 mg by mouth daily.  05/16/19  Yes [provider]  Prenatal Vit-Fe Fumarate-FA (PRENATAL MULTIVITAMIN) TABS tablet Take 1 tablet by mouth daily.   Yes [provider]  simvastatin (ZOCOR) 40 MG tablet Take 20 mg by mouth at bedtime.    Yes [provider]  sucralfate (CARAFATE) 1 g tablet Take 1 tablet by mouth 3 (three) times daily before meals. 09/15/19 09/14/20 Yes [provider]  traZODone (DESYREL) 50 MG tablet Take 50 mg by mouth at bedtime.   Yes [provider]  torsemide (DEMADEX) 20 MG tablet Take 2 tablets (40 mg total) by mouth daily. 08/27/19 12/28/19  HAlisa Graff FNP     Family History  Problem Relation Age of Onset  . Hypertension Father   . Heart attack Father   . Depression Sister   . Depression Brother   . Depression Brother     Social History   Socioeconomic History  . Marital status: Married    Spouse name: rodney  . Number of children: 2  . Years of education: Not on file  . Highest education level: High school graduate  Occupational History  . Not on file  Tobacco Use  . Smoking status: Current Every Day Smoker    Packs/day: 0.25    Types: E-cigarettes, Cigarettes  . Smokeless tobacco: Never Used  Vaping Use  . Vaping Use: Former  Substance and Sexual Activity  . Alcohol use: Not Currently    Comment: occasionally  . Drug use: No  . Sexual activity: Not Currently  Other Topics Concern  . Not on file  Social History Narrative  . Not on file   Social Determinants of Health   Financial Resource Strain:   . Difficulty of Paying  Living Expenses: Not on file  Food Insecurity:   . Worried About RCharity fundraiserin the Last Year: Not on file  . Ran Out of Food in the Last Year: Not on file  Transportation Needs:   . Lack of Transportation (Medical): Not on file  . Lack of Transportation (Non-Medical): Not on file  Physical Activity:   . Days of Exercise per Week: Not on file  . Minutes of Exercise per Session: Not on file  Stress:   . Feeling of Stress : Not on file  Social Connections:   . Frequency of Communication with Friends and Family: Not on file  . Frequency of Social Gatherings with Friends and Family: Not on file  . Attends Religious Services: Not on file  . Active Member of Clubs or Organizations: Not on file  . Attends CArchivistMeetings: Not on file  . Marital Status: Not on file    Review of Systems: A 12 point ROS discussed and pertinent positives are indicated in the HPI above.  All other systems are negative.  Vital  Signs: BP 103/62   Pulse 86   Temp 98.4 F (36.9 C) (Oral)   Resp 20   Ht 5' 4"  (1.626 m)   Wt 75.8 kg   SpO2 100%   BMI 28.67 kg/m   Physical Exam Constitutional:      General: She is not in acute distress. HENT:     Head: Normocephalic.     Mouth/Throat:     Mouth: Mucous membranes are moist.  Cardiovascular:     Rate and Rhythm: Normal rate and regular rhythm.     Heart sounds: Normal heart sounds.  Pulmonary:     Effort: Pulmonary effort is normal. No respiratory distress.     Breath sounds: Normal breath sounds.  Abdominal:     General: There is no distension.  Skin:    General: Skin is warm.  Neurological:     General: No focal deficit present.     Mental Status: She is alert.  Psychiatric:        Mood and Affect: Mood normal.     Imaging: No results found.  Labs:  CBC: Recent Labs    12/28/19 1034 01/18/20 1335 02/08/20 1344 02/21/20 0758  WBC 7.3 9.0 8.3 6.8  HGB 8.8* 8.5* 7.9* 7.9*  HCT 27.1* 26.9* 24.8* 25.9*  PLT 198  199 211 193    COAGS: Recent Labs    09/04/19 1800  INR 1.4*    BMP: Recent Labs    09/06/19 0823 09/06/19 0823 09/07/19 0428 09/07/19 0951 09/08/19 0607 09/09/19 0522  NA 138  --  141  --  141 139  K 3.8  --  3.5  --  3.3* 4.0  CL 102  --  102  --  101 103  CO2 27  --  30  --  30 28  GLUCOSE 80  --  86  --  94 113*  BUN 35*  --  33*  --  27* 22  CALCIUM 8.3*   < > 8.5* 8.6* 8.6* 8.7*  CREATININE 2.14*  --  2.13*  --  2.08* 1.87*  GFRNONAA 22*  --  22*  --  22* 25*  GFRAA 25*  --  25*  --  26* 30*   < > = values in this interval not displayed.    LIVER FUNCTION TESTS: Recent Labs    08/27/19 1848 09/04/19 1800 09/07/19 0428 09/08/19 0607  BILITOT 1.1 1.0  --   --   AST 22 19  --   --   ALT 13 10  --   --   ALKPHOS 88 67  --   --   PROT 7.7 6.6  --   --   ALBUMIN 3.7 3.1* 3.2* 3.2*    TUMOR MARKERS: No results for input(s): AFPTM, CEA, CA199, CHROMGRNA in the last 8760 hours.  Assessment and Plan:  79 year old female with history of anemia presenting for CT-guided bone marrow biopsy.  Plan for moderate sedation.  Thank you for this interesting consult.  I greatly enjoyed meeting KADIN BERA and look forward to participating in their care.  A copy of this report was sent to the requesting provider on this date.  Electronically Signed: Suzette Battiest, MD 02/21/2020, 8:44 AM   I spent a total of15 Minutes in face to face in clinical consultation, greater than 50% of which was counseling/coordinating care for bone marrow biopsy.

## 2020-02-21 NOTE — Procedures (Signed)
Interventional Radiology Procedure Note  Procedure: CT guided aspirate and core biopsy of right iliac bone  Complications: None  Recommendations: - Bedrest supine x 1 hrs - Hydrocodone PRN  Pain - Follow biopsy results   Heather Acheampong, MD   

## 2020-02-21 NOTE — Discharge Instructions (Signed)
Moderate Conscious Sedation, Adult, Care After These instructions provide you with information about caring for yourself after your procedure. Your health care provider may also give you more specific instructions. Your treatment has been planned according to current medical practices, but problems sometimes occur. Call your health care provider if you have any problems or questions after your procedure. What can I expect after the procedure? After your procedure, it is common:  To feel sleepy for several hours.  To feel clumsy and have poor balance for several hours.  To have poor judgment for several hours.  To vomit if you eat too soon. Follow these instructions at home: For at least 24 hours after the procedure:   Do not: ? Participate in activities where you could fall or become injured. ? Drive. ? Use heavy machinery. ? Drink alcohol. ? Take sleeping pills or medicines that cause drowsiness. ? Make important decisions or sign legal documents. ? Take care of children on your own.  Rest. Eating and drinking  Follow the diet recommended by your health care provider.  If you vomit: ? Drink water, juice, or soup when you can drink without vomiting. ? Make sure you have little or no nausea before eating solid foods. General instructions  Have a responsible adult stay with you until you are awake and alert.  Take over-the-counter and prescription medicines only as told by your health care provider.  If you smoke, do not smoke without supervision.  Keep all follow-up visits as told by your health care provider. This is important. Contact a health care provider if:  You keep feeling nauseous or you keep vomiting.  You feel light-headed.  You develop a rash.  You have a fever. Get help right away if:  You have trouble breathing. This information is not intended to replace advice given to you by your health care provider. Make sure you discuss any questions you have  with your health care provider. Document Revised: 04/11/2017 Document Reviewed: 08/19/2015 Elsevier Patient Education  2020 [place]. Bone Marrow Aspiration and Bone Marrow Biopsy, Adult, Care After This sheet gives you information about how to care for yourself after your procedure. Your health care provider may also give you more specific instructions. If you have problems or questions, contact your health care provider. What can I expect after the procedure? After the procedure, it is common to have:  Mild pain and tenderness.  Swelling.  Bruising. Follow these instructions at home: Puncture site care   Follow instructions from your health care provider about how to take care of the puncture site. Make sure you: ? Wash your hands with soap and water before and after you change your bandage (dressing). If soap and water are not available, use hand sanitizer. ? Change your dressing as told by your health care provider.  Check your puncture site every day for signs of infection. Check for: ? More redness, swelling, or pain. ? Fluid or blood. ? Warmth. ? Pus or a bad smell. Activity  Return to your normal activities as told by your health care provider. Ask your health care provider what activities are safe for you.  Do not lift anything that is heavier than 10 lb (4.5 kg), or the limit that you are told, until your health care provider says that it is safe.  Do not drive for 24 hours if you were given a sedative during your procedure. General instructions   Take over-the-counter and prescription medicines only as told by your  health care provider.  Do not take baths, swim, or use a hot tub until your health care provider approves. Ask your health care provider if you may take showers. You may only be allowed to take sponge baths.  If directed, put ice on the affected area. To do this: ? Put ice in a plastic bag. ? Place a towel between your skin and the bag. ? Leave  the ice on for 20 minutes, 2-3 times a day.  Keep all follow-up visits as told by your health care provider. This is important. Contact a health care provider if:  Your pain is not controlled with medicine.  You have a fever.  You have more redness, swelling, or pain around the puncture site.  You have fluid or blood coming from the puncture site.  Your puncture site feels warm to the touch.  You have pus or a bad smell coming from the puncture site. Summary  After the procedure, it is common to have mild pain, tenderness, swelling, and bruising.  Follow instructions from your health care provider about how to take care of the puncture site and what activities are safe for you.  Take over-the-counter and prescription medicines only as told by your health care provider.  Contact a health care provider if you have any signs of infection, such as fluid or blood coming from the puncture site. This information is not intended to replace advice given to you by your health care provider. Make sure you discuss any questions you have with your health care provider. Document Revised: 09/15/2018 Document Reviewed: 09/15/2018 Elsevier Patient Education  [place].

## 2020-02-25 NOTE — Progress Notes (Signed)
[place]  Telephone:([phone]) [phone] Fax:(336) [phone]  ID: Heather Peterson OB: 16-Jun-1940  MR#: [id number]  CLE#:[id number]  [name], MD as PCP - General (Family Medicine) Lloyd Huger, MD as Consulting Physician (Oncology)  CHIEF COMPLAINT: Multiple myeloma.  INTERVAL HISTORY: Patient returns to clinic today for further evaluation, discussion of her bone marrow biopsy results, and treatment planning.  She continues to have persistent weakness and fatigue.  She has chronic shortness of breath and requires oxygen 24 hours/day. She has no neurologic complaints.  She denies any recent fevers.  She has a fair appetite.  She denies any chest pain, cough, or hemoptysis.  She denies any nausea, vomiting, constipation, or diarrhea.  She has no melena or hematochezia.  She has no urinary complaints.  Patient offers no further specific complaints today.  REVIEW OF SYSTEMS:   Review of Systems  Constitutional: Positive for malaise/fatigue. Negative for fever.  Respiratory: Positive for shortness of breath. Negative for cough and hemoptysis.   Cardiovascular: Positive for leg swelling. Negative for chest pain.  Gastrointestinal: Negative.  Negative for abdominal pain, melena and nausea.  Genitourinary: Negative.  Negative for dysuria and hematuria.  Musculoskeletal: Negative.  Negative for back pain.  Skin: Negative.  Negative for rash.  Neurological: Positive for weakness. Negative for dizziness, focal weakness and headaches.  Psychiatric/Behavioral: Negative.  The patient is not nervous/anxious.     As per HPI. Otherwise, a complete review of systems is negative.  PAST MEDICAL HISTORY: Past Medical History:  Diagnosis Date  . Anxiety   . Chronic combined systolic and diastolic CHF (congestive heart failure) ([place])   . Chronic kidney disease   . Coronary artery disease   . Depression   . Diabetes mellitus without complication ([place])    . Diabetes mellitus, type II ([place])   . Hypertension   . MI (myocardial infarction) ([place])    x 5  . Pacemaker   . Prolonged Q-T interval on ECG   . Thyroid disease     PAST SURGICAL HISTORY: Past Surgical History:  Procedure Laterality Date  . CENTRAL LINE INSERTION  03/11/2017   Procedure: CENTRAL LINE INSERTION;  Surgeon: Leonie Man, MD;  Location: [place] [facility];  Service: Cardiovascular;;  . CHOLECYSTECTOMY    . COLONOSCOPY WITH PROPOFOL N/A 09/01/2019   Procedure: COLONOSCOPY WITH PROPOFOL;  Surgeon: Toledo, Benay Pike, MD;  Location: [facility] ENDOSCOPY;  Service: Gastroenterology;  Laterality: N/A;  . CORONARY STENT INTERVENTION W/IMPELLA N/A 03/11/2017   Procedure: Coronary Stent Intervention w/Impella;  Surgeon: Leonie Man, MD;  Location: [place] [facility];  Service: Cardiovascular;  Laterality: N/A;  . ESOPHAGOGASTRODUODENOSCOPY (EGD) WITH PROPOFOL N/A 09/01/2019   Procedure: ESOPHAGOGASTRODUODENOSCOPY (EGD) WITH PROPOFOL;  Surgeon: Toledo, Benay Pike, MD;  Location: [facility] ENDOSCOPY;  Service: Gastroenterology;  Laterality: N/A;  . ESOPHAGOGASTRODUODENOSCOPY (EGD) WITH PROPOFOL N/A 09/08/2019   Procedure: ESOPHAGOGASTRODUODENOSCOPY (EGD) WITH PROPOFOL;  Surgeon: [name], MD;  Location: [facility] ENDOSCOPY;  Service: Gastroenterology;  Laterality: N/A;  . EYE SURGERY    . INTRAVASCULAR PRESSURE WIRE/FFR STUDY N/A 03/11/2017   Procedure: INTRAVASCULAR PRESSURE WIRE/FFR STUDY;  Surgeon: Leonie Man, MD;  Location: [place] [facility];  Service: Cardiovascular;  Laterality: N/A;  . INTRAVASCULAR ULTRASOUND/IVUS N/A 03/11/2017   Procedure: Intravascular Ultrasound/IVUS;  Surgeon: Leonie Man, MD;  Location: [place] [facility];  Service: Cardiovascular;  Laterality: N/A;  . LEFT HEART CATH AND CORONARY ANGIOGRAPHY N/A 03/05/2017   Procedure: LEFT  HEART CATH AND CORONARY ANGIOGRAPHY;  Surgeon: Isaias Cowman, MD;  Location: [place] [facility];  Service:  Cardiovascular;  Laterality: N/A;  . PACEMAKER IMPLANT    . pacemaker/defibrillator Left     FAMILY HISTORY: Family History  Problem Relation Age of Onset  . Hypertension Father   . Heart attack Father   . Depression Sister   . Depression Brother   . Depression Brother     ADVANCED DIRECTIVES (Y/N):  N  HEALTH MAINTENANCE: Social History   Tobacco Use  . Smoking status: Current Every Day Smoker    Packs/day: 0.25    Types: E-cigarettes, Cigarettes  . Smokeless tobacco: Never Used  Vaping Use  . Vaping Use: Former  Substance Use Topics  . Alcohol use: Not Currently    Comment: occasionally  . Drug use: No     Colonoscopy:  PAP:  Bone density:  Lipid panel:  Allergies  Allergen Reactions  . Celebrex [Celecoxib] Anaphylaxis  . Glipizide Anaphylaxis  . Lisinopril Swelling    Lip and facial swelling  . Sulfa Antibiotics Other (See Comments) and Anaphylaxis    Reaction: unknown  . Levaquin [Levofloxacin In D5w] Other (See Comments)    Heart arrhthymias  . Levofloxacin Other (See Comments)    ICD fired  . Metformin Other (See Comments)    Gi tolerance   . Penicillins Rash and Other (See Comments)    Has patient had a PCN reaction causing immediate rash, facial/tongue/throat swelling, SOB or lightheadedness with hypotension: Unknown Has patient had a PCN reaction causing severe rash involving mucus membranes or skin necrosis: No Has patient had a PCN reaction that required hospitalization: No Has patient had a PCN reaction occurring within the last 10 years: No If all of the above answers are "NO", then may proceed with Cephalosporin use.     Current Outpatient Medications  Medication Sig Dispense Refill  . allopurinol (ZYLOPRIM) 100 MG tablet Take 50 mg by mouth daily.    Marland Kitchen aspirin EC 81 MG tablet Take 81 mg by mouth at bedtime.     . fexofenadine (ALLEGRA) 180 MG tablet Take 180 mg by mouth daily.    Marland Kitchen FLUoxetine (PROZAC) 10 MG capsule Take 10 mg by mouth  daily.    Marland Kitchen levothyroxine (SYNTHROID) 50 MCG tablet Take 50 mcg by mouth See admin instructions. Take 1 tablet (33mg) by mouth every Tuesday, Thursday, Saturday and Sunday before breakfast    . levothyroxine (SYNTHROID, LEVOTHROID) 75 MCG tablet Take 75 mcg by mouth See admin instructions. Take 1 tablet (73m) by mouth every Monday, Wednesday and Friday morning before breakfast    . magnesium oxide (MAG-OX) 400 MG tablet Take 200 mg by mouth daily.    . Marland Kitchenexiletine (MEXITIL) 200 MG capsule Take 1 capsule (200 mg total) by mouth every 12 (twelve) hours. 60 capsule 1  . midodrine (PROAMATINE) 5 MG tablet Take 5 mg by mouth daily as needed.     . OXYGEN Inhale 2 L into the lungs continuous.    . pantoprazole (PROTONIX) 40 MG tablet Take 40 mg by mouth daily.     . Prenatal Vit-Fe Fumarate-FA (PRENATAL MULTIVITAMIN) TABS tablet Take 1 tablet by mouth daily.    . simvastatin (ZOCOR) 40 MG tablet Take 20 mg by mouth at bedtime.   2  . sucralfate (CARAFATE) 1 g tablet Take 1 tablet by mouth 3 (three) times daily before meals.    . torsemide (DEMADEX) 20 MG tablet Take 2 tablets (40  mg total) by mouth daily. Future refills need to come from kidney doctor 60 tablet 1  . traZODone (DESYREL) 50 MG tablet Take 50 mg by mouth at bedtime.     No current facility-administered medications for this visit.    OBJECTIVE: Vitals:   02/29/20 1346  BP: (!) 108/58  Pulse: 86  Resp: 20  Temp: (!) 97.2 F (36.2 C)  SpO2: 100%     Body mass index is 30.09 kg/m.    ECOG FS:2 - Symptomatic, <50% confined to bed  General: Well-developed, well-nourished, no acute distress. Eyes: Pink conjunctiva, anicteric sclera. HEENT: Normocephalic, moist mucous membranes. Lungs: No audible wheezing or coughing. Heart: Regular rate and rhythm. Abdomen: Soft, nontender, no obvious distention. Musculoskeletal: No edema, cyanosis, or clubbing. Neuro: Alert, answering all questions appropriately. Cranial nerves grossly  intact. Skin: No rashes or petechiae noted. Psych: Normal affect.   LAB RESULTS:  Lab Results  Component Value Date   NA 139 02/29/2020   K 3.6 02/29/2020   CL 100 02/29/2020   CO2 28 02/29/2020   GLUCOSE 147 (H) 02/29/2020   BUN 39 (H) 02/29/2020   CREATININE 2.14 (H) 02/29/2020   CALCIUM 8.7 (L) 02/29/2020   PROT 6.6 09/04/2019   ALBUMIN 3.2 (L) 09/08/2019   AST 19 09/04/2019   ALT 10 09/04/2019   ALKPHOS 67 09/04/2019   BILITOT 1.0 09/04/2019   GFRNONAA 21 (L) 02/29/2020   GFRAA 30 (L) 09/09/2019    Lab Results  Component Value Date   WBC 7.1 02/29/2020   NEUTROABS 4.6 02/29/2020   HGB 8.0 (L) 02/29/2020   HCT 26.1 (L) 02/29/2020   MCV 97.4 02/29/2020   PLT 208 02/29/2020   Lab Results  Component Value Date   IRON 104 02/08/2020   TIBC 405 02/08/2020   IRONPCTSAT 26 02/08/2020   Lab Results  Component Value Date   FERRITIN 26 02/08/2020     STUDIES: CT BONE MARROW BIOPSY & ASPIRATION  Result Date: 02/21/2020 INDICATION: 79 year old female with history of anemia. EXAM: CT-GUIDED BONE MARROW BIOPSY AND ASPIRATION MEDICATIONS: None ANESTHESIA/SEDATION: Fentanyl 50 mcg IV; Versed 1 mg IV Sedation Time: 11 minutes; The patient was continuously monitored during the procedure by the interventional radiology nurse under my direct supervision. COMPLICATIONS: None immediate. PROCEDURE: Informed consent was obtained from the patient following an explanation of the procedure, risks, benefits and alternatives. The patient understands, agrees and consents for the procedure. All questions were addressed. A time out was performed prior to the initiation of the procedure. The patient was positioned prone and non-contrast localization CT was performed of the pelvis to demonstrate the iliac marrow spaces. The operative site was prepped and draped in the usual sterile fashion. Under sterile conditions and local anesthesia, a 22 gauge spinal needle was utilized for procedural  planning. Next, an 11 gauge coaxial bone biopsy needle was advanced into the right iliac marrow space. Needle position was confirmed with CT imaging. Initially, a bone marrow aspiration was performed. Next, a bone marrow biopsy was obtained with the 11 gauge outer bone marrow device. The 11 gauge coaxial bone biopsy needle was re-advanced into a slightly different location within the left iliac marrow space, positioning was confirmed with CT imaging and an additional bone marrow biopsy was obtained. Samples were prepared with the cytotechnologist and deemed adequate. The needle was removed and superficial hemostasis was obtained with manual compression. A dressing was applied. The patient tolerated the procedure well without immediate post procedural complication. IMPRESSION: Successful CT  guided right iliac bone marrow aspiration and core biopsy. Ruthann Cancer, MD [facility] Radiology Electronically Signed   By: Ruthann Cancer MD   On: 02/21/2020 09:59    ASSESSMENT: Multiple myeloma.  PLAN:    1.  Multiple myeloma: Bone marrow biopsy confirmed diagnosis with plasma cells up to 90% of biopsy.  Cytogenetics are reported as normal.  Will get metastatic bone survey to complete the work-up.  Patient noted to have endorgan damage of renal insufficiency as well as persistent anemia.  SPEP, immunoglobulins, and kappa/lambda light chains are pending at time of dictation.  Patient will benefit from Velcade on days 1, 4, 8, and 11 along with 10 mg Revlimid on days 1 through 14.  Revlimid has been dose reduced secondary to renal insufficiency.  Return to clinic on March 13, 2020 to initiate cycle 1, day 1. 2.  Anemia: Likely multifactorial with chronic renal insufficiency, underlying myeloma, as well as history of recent GI bleed.  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 She does not require transfusion or IV  iron at this time, but this may be necessary in the future. 3.  Chronic renal insufficiency: Patient's creatinine is trending up and is now greater than 2.0.  Dose reduced Revlimid as above.  Monitor.   3.  Ascites: Continue follow-up with heart failure clinic as scheduled. 4.  Angiodysplastic lesions: Continue follow-up with GI as scheduled.  I spent a total of 30 minutes reviewing chart data, face-to-face evaluation with the patient, counseling and coordination of care as detailed above.    Patient expressed understanding and was in agreement with this plan. She also understands that She can call clinic at any time with any questions, concerns, or complaints.    Lloyd Huger, MD   03/01/2020 6:51 AM

## 2020-02-28 ENCOUNTER — Encounter (HOSPITAL_COMMUNITY): Payer: Self-pay | Admitting: Oncology

## 2020-02-29 ENCOUNTER — Inpatient Hospital Stay (HOSPITAL_BASED_OUTPATIENT_CLINIC_OR_DEPARTMENT_OTHER): Payer: Medicare Other | Admitting: Oncology

## 2020-02-29 ENCOUNTER — Inpatient Hospital Stay: Payer: Medicare Other | Attending: Oncology

## 2020-02-29 ENCOUNTER — Telehealth: Payer: Self-pay | Admitting: Pharmacy Technician

## 2020-02-29 ENCOUNTER — Inpatient Hospital Stay: Payer: Medicare Other

## 2020-02-29 ENCOUNTER — Encounter: Payer: Self-pay | Admitting: Oncology

## 2020-02-29 ENCOUNTER — Other Ambulatory Visit: Payer: Self-pay

## 2020-02-29 ENCOUNTER — Telehealth: Payer: Self-pay | Admitting: Pharmacist

## 2020-02-29 ENCOUNTER — Other Ambulatory Visit: Payer: Self-pay | Admitting: *Deleted

## 2020-02-29 ENCOUNTER — Other Ambulatory Visit: Payer: Self-pay | Admitting: Oncology

## 2020-02-29 VITALS — BP 108/58 | HR 86 | Temp 97.2°F | Resp 20 | Wt 175.3 lb

## 2020-02-29 DIAGNOSIS — Z8249 Family history of ischemic heart disease and other diseases of the circulatory system: Secondary | ICD-10-CM | POA: Diagnosis not present

## 2020-02-29 DIAGNOSIS — Z7189 Other specified counseling: Secondary | ICD-10-CM | POA: Diagnosis not present

## 2020-02-29 DIAGNOSIS — N1832 Chronic kidney disease, stage 3b: Secondary | ICD-10-CM

## 2020-02-29 DIAGNOSIS — Z79899 Other long term (current) drug therapy: Secondary | ICD-10-CM | POA: Diagnosis not present

## 2020-02-29 DIAGNOSIS — Z833 Family history of diabetes mellitus: Secondary | ICD-10-CM | POA: Diagnosis not present

## 2020-02-29 DIAGNOSIS — I110 Hypertensive heart disease with heart failure: Secondary | ICD-10-CM | POA: Insufficient documentation

## 2020-02-29 DIAGNOSIS — C9000 Multiple myeloma not having achieved remission: Secondary | ICD-10-CM | POA: Diagnosis not present

## 2020-02-29 DIAGNOSIS — I252 Old myocardial infarction: Secondary | ICD-10-CM | POA: Insufficient documentation

## 2020-02-29 DIAGNOSIS — F17210 Nicotine dependence, cigarettes, uncomplicated: Secondary | ICD-10-CM | POA: Insufficient documentation

## 2020-02-29 DIAGNOSIS — D631 Anemia in chronic kidney disease: Secondary | ICD-10-CM | POA: Diagnosis not present

## 2020-02-29 DIAGNOSIS — Z7982 Long term (current) use of aspirin: Secondary | ICD-10-CM | POA: Diagnosis not present

## 2020-02-29 DIAGNOSIS — N189 Chronic kidney disease, unspecified: Secondary | ICD-10-CM | POA: Diagnosis present

## 2020-02-29 DIAGNOSIS — I5042 Chronic combined systolic (congestive) and diastolic (congestive) heart failure: Secondary | ICD-10-CM | POA: Diagnosis not present

## 2020-02-29 DIAGNOSIS — R188 Other ascites: Secondary | ICD-10-CM | POA: Insufficient documentation

## 2020-02-29 DIAGNOSIS — D649 Anemia, unspecified: Secondary | ICD-10-CM

## 2020-02-29 DIAGNOSIS — E119 Type 2 diabetes mellitus without complications: Secondary | ICD-10-CM | POA: Diagnosis not present

## 2020-02-29 LAB — CBC WITH DIFFERENTIAL/PLATELET
Abs Immature Granulocytes: 0.05 10*3/uL (ref 0.00–0.07)
Basophils Absolute: 0 10*3/uL (ref 0.0–0.1)
Basophils Relative: 1 %
Eosinophils Absolute: 0.1 10*3/uL (ref 0.0–0.5)
Eosinophils Relative: 2 %
HCT: 26.1 % — ABNORMAL LOW (ref 36.0–46.0)
Hemoglobin: 8 g/dL — ABNORMAL LOW (ref 12.0–15.0)
Immature Granulocytes: 1 %
Lymphocytes Relative: 23 %
Lymphs Abs: 1.7 10*3/uL (ref 0.7–4.0)
MCH: 29.9 pg (ref 26.0–34.0)
MCHC: 30.7 g/dL (ref 30.0–36.0)
MCV: 97.4 fL (ref 80.0–100.0)
Monocytes Absolute: 0.7 10*3/uL (ref 0.1–1.0)
Monocytes Relative: 10 %
Neutro Abs: 4.6 10*3/uL (ref 1.7–7.7)
Neutrophils Relative %: 63 %
Platelets: 208 10*3/uL (ref 150–400)
RBC: 2.68 MIL/uL — ABNORMAL LOW (ref 3.87–5.11)
RDW: 18.9 % — ABNORMAL HIGH (ref 11.5–15.5)
WBC: 7.1 10*3/uL (ref 4.0–10.5)
nRBC: 0 % (ref 0.0–0.2)

## 2020-02-29 LAB — BASIC METABOLIC PANEL
Anion gap: 11 (ref 5–15)
BUN: 39 mg/dL — ABNORMAL HIGH (ref 8–23)
CO2: 28 mmol/L (ref 22–32)
Calcium: 8.7 mg/dL — ABNORMAL LOW (ref 8.9–10.3)
Chloride: 100 mmol/L (ref 98–111)
Creatinine, Ser: 2.14 mg/dL — ABNORMAL HIGH (ref 0.44–1.00)
GFR, Estimated: 21 mL/min — ABNORMAL LOW (ref >60)
Glucose, Bld: 147 mg/dL — ABNORMAL HIGH (ref 70–99)
Potassium: 3.6 mmol/L (ref 3.5–5.1)
Sodium: 139 mmol/L (ref 135–145)

## 2020-02-29 LAB — SURGICAL PATHOLOGY

## 2020-02-29 LAB — SAMPLE TO BLOOD BANK

## 2020-02-29 NOTE — Telephone Encounter (Signed)
Oral Oncology Pharmacist Encounter  Received new prescription for Revlimid (lenalidomide) for the treatment of newly diagnosed multiple myeloma in conjunction with bortezomib and dexamethasone, planned duration until disease control or unacceptable drug toxicity.  BMP from 02/29/20 assessed, SCr elevated CrCl ~22 mL/min, patient has known renal insuffiecenty. Prescription dose adjusted accordingly. Will continue to monitor renal function and dose tolerability.  Current medication list in Epic reviewed, no DDIs with lenalidomide identified.  Patient has aspirin 35m in medication list will confirm she is taking this at home.  Evaluated chart and no patient barriers to medication adherence identified.   Prescription is being reviewed for benefits analysis and approval.  REMS Auth # [id number] Oral Oncology Clinic will continue to follow for insurance authorization, copayment issues, initial counseling and start date.  ADarl Pikes PharmD, BCPS, BCOP, CPP Hematology/Oncology Clinical Pharmacist Practitioner [phone] 02/29/2020 3:04 PM

## 2020-02-29 NOTE — Telephone Encounter (Signed)
Oral Oncology Patient Advocate Encounter   Received notification from [employer] that prior authorization for Revlimid is required.   PA submitted on CoverMyMeds Key [id number] Status is pending   Oral Oncology Clinic will continue to follow.  [place] Patient [place] Phone [phone] Fax [phone] 03/01/2020 10:46 AM

## 2020-02-29 NOTE — Progress Notes (Signed)
Patient reports feeling very weak and tired and would like to discuss along with biopsy results. She reports pain with hemorrhoids but is being followed and has appt with gastrology soon. She denies other concerns at this time.

## 2020-03-01 ENCOUNTER — Other Ambulatory Visit: Payer: Self-pay | Admitting: Oncology

## 2020-03-01 DIAGNOSIS — C9000 Multiple myeloma not having achieved remission: Secondary | ICD-10-CM | POA: Insufficient documentation

## 2020-03-01 DIAGNOSIS — Z7189 Other specified counseling: Secondary | ICD-10-CM | POA: Insufficient documentation

## 2020-03-01 LAB — IGG, IGA, IGM
IgA: 2773 mg/dL — ABNORMAL HIGH (ref 64–422)
IgG (Immunoglobin G), Serum: 123 mg/dL — ABNORMAL LOW (ref 586–1602)
IgM (Immunoglobulin M), Srm: <5 mg/dL — ABNORMAL LOW (ref 26–217)

## 2020-03-01 LAB — KAPPA/LAMBDA LIGHT CHAINS
Kappa free light chain: 6.2 mg/L (ref 3.3–19.4)
Kappa, lambda light chain ratio: 0.47 (ref 0.26–1.65)
Lambda free light chains: 13.1 mg/L (ref 5.7–26.3)

## 2020-03-01 MED ORDER — LENALIDOMIDE 10 MG PO CAPS: 10.0000 mg | ORAL_CAPSULE | Freq: Every day | ORAL | 0 refills | Status: DC

## 2020-03-01 MED ORDER — DEXAMETHASONE 4 MG PO TABS: 20.0000 mg | ORAL_TABLET | Freq: Every day | ORAL | 3 refills | Status: DC

## 2020-03-01 MED ORDER — LENALIDOMIDE 10 MG PO CAPS: 10.0000 mg | ORAL_CAPSULE | Freq: Every day | ORAL | 5 refills | Status: DC

## 2020-03-01 MED ORDER — PROCHLORPERAZINE MALEATE 10 MG PO TABS: 10.0000 mg | ORAL_TABLET | Freq: Four times a day (QID) | ORAL | 2 refills | Status: DC | PRN

## 2020-03-01 MED ORDER — ONDANSETRON HCL 8 MG PO TABS: 8.0000 mg | ORAL_TABLET | Freq: Two times a day (BID) | ORAL | 2 refills | Status: DC | PRN

## 2020-03-01 NOTE — Progress Notes (Signed)
START ON PATHWAY REGIMEN - Multiple Myeloma and Other Plasma Cell Dyscrasias     A cycle is every 21 days:     Bortezomib      Lenalidomide      Dexamethasone   **Always confirm dose/schedule in your pharmacy ordering system**  Patient Characteristics: Multiple Myeloma, Newly Diagnosed, Transplant Ineligible or Refused, Unknown or Awaiting Test Results Disease Classification: Multiple Myeloma R-ISS Staging: III Therapeutic Status: Newly Diagnosed Is Patient Eligible for Transplant<= Transplant Ineligible or Refused Risk Status: Unknown Intent of Therapy: Non-Curative / Palliative Intent, Discussed with Patient

## 2020-03-01 NOTE — Telephone Encounter (Signed)
Oral Chemotherapy Pharmacist Encounter   Revlimid education will be provided during upcoming CPP visit.  Darl Pikes, PharmD, BCPS, BCOP, CPP Hematology/Oncology Clinical Pharmacist [facility] [place] [facility] [phone]  03/01/2020 11:46 AM

## 2020-03-01 NOTE — Telephone Encounter (Signed)
Oral Oncology [place] Advocate Encounter  Prior Authorization for Revlimid has been approved.    PA# [id number] Effective dates: 03/01/20 through 05/12/21  Prescription sent to [facility] pharmacy.  Will follow up for copay and assistance needs.  Oral Oncology Clinic will continue to follow.   Heather Peterson [place] Phone [phone] Fax [phone] 03/01/2020 10:49 AM

## 2020-03-01 NOTE — Telephone Encounter (Signed)
Prescription sent to [facility]

## 2020-03-02 ENCOUNTER — Telehealth: Payer: Self-pay | Admitting: *Deleted

## 2020-03-02 LAB — PROTEIN ELECTROPHORESIS, SERUM
A/G Ratio: 0.9 (ref 0.7–1.7)
Albumin ELP: 3.5 g/dL (ref 2.9–4.4)
Alpha-1-Globulin: 0.2 g/dL (ref 0.0–0.4)
Alpha-2-Globulin: 0.8 g/dL (ref 0.4–1.0)
Beta Globulin: 2.4 g/dL — ABNORMAL HIGH (ref 0.7–1.3)
Gamma Globulin: 0.6 g/dL (ref 0.4–1.8)
Globulin, Total: 4 g/dL — ABNORMAL HIGH (ref 2.2–3.9)
M-Spike, %: 1.4 g/dL — ABNORMAL HIGH
Total Protein ELP: 7.5 g/dL (ref 6.0–8.5)

## 2020-03-02 NOTE — Telephone Encounter (Signed)
[facility] called stating that patient survey flagged and they need our office to call [employer] to clear the flag and then call them back to let them know that it has cleared so that they can dispense Revlimid

## 2020-03-02 NOTE — Telephone Encounter (Signed)
Heather Peterson has spoken with them and the medication has been cleared to be released. Thanks.

## 2020-03-03 ENCOUNTER — Ambulatory Visit
Admission: RE | Admit: 2020-03-03 | Discharge: 2020-03-03 | Disposition: A | Discharge status: Home / Self Care | Payer: Medicare Other | Source: Ambulatory Visit | Attending: Oncology | Admitting: Oncology

## 2020-03-03 ENCOUNTER — Ambulatory Visit
Admission: RE | Admit: 2020-03-03 | Discharge: 2020-03-03 | Disposition: A | Discharge status: Home / Self Care | Payer: Medicare Other | Attending: Oncology | Admitting: Oncology

## 2020-03-03 DIAGNOSIS — C9000 Multiple myeloma not having achieved remission: Secondary | ICD-10-CM | POA: Insufficient documentation

## 2020-03-03 NOTE — Telephone Encounter (Signed)
Oral Chemotherapy Pharmacist Encounter   [facility] is scheduled to deliver Revlimid today 03/03/20. Called Ms. Ferrer to confirm. [facility] was able to sign her up for a grant to cover the copay. She knows not to start her Revlimid in until after her appt on 11/1 and that Revlimid education will be provided during that visit.  Darl Pikes, PharmD, BCPS, BCOP, CPP Hematology/Oncology Clinical Pharmacist [facility] [place] [phone]  03/03/2020 9:21 AM

## 2020-03-06 ENCOUNTER — Other Ambulatory Visit: Payer: Self-pay | Admitting: Gastroenterology

## 2020-03-06 DIAGNOSIS — I7090 Unspecified atherosclerosis: Secondary | ICD-10-CM

## 2020-03-06 DIAGNOSIS — K7469 Other cirrhosis of liver: Secondary | ICD-10-CM

## 2020-03-06 NOTE — Patient Instructions (Signed)
Bortezomib injection What is this medicine? BORTEZOMIB (bor TEZ oh mib) is a medicine that targets proteins in cancer cells and stops the cancer cells from growing. It is used to treat multiple myeloma and mantle-cell lymphoma. This medicine may be used for other purposes; ask your health care provider or pharmacist if you have questions. COMMON BRAND NAME(S): Velcade What should I tell my health care provider before I take this medicine? They need to know if you have any of these conditions:  diabetes  heart disease  irregular heartbeat  liver disease  on hemodialysis  low blood counts, like low white blood cells, platelets, or hemoglobin  peripheral neuropathy  taking medicine for blood pressure  an unusual or allergic reaction to bortezomib, mannitol, boron, other medicines, foods, dyes, or preservatives  pregnant or trying to get pregnant  breast-feeding How should I use this medicine? This medicine is for injection into a vein or for injection under the skin. It is given by a health care professional in a hospital or clinic setting. Talk to your pediatrician regarding the use of this medicine in children. Special care may be needed. Overdosage: If you think you have taken too much of this medicine contact a poison control center or emergency room at once. NOTE: This medicine is only for you. Do not share this medicine with others. What if I miss a dose? It is important not to miss your dose. Call your doctor or health care professional if you are unable to keep an appointment. What may interact with this medicine? This medicine may interact with the following medications:  ketoconazole  rifampin  ritonavir  St. John's Wort This list may not describe all possible interactions. Give your health care provider a list of all the medicines, herbs, non-prescription drugs, or dietary supplements you use. Also tell them if you smoke, drink alcohol, or use illegal drugs. Some  items may interact with your medicine. What should I watch for while using this medicine? You may get drowsy or dizzy. Do not drive, use machinery, or do anything that needs mental alertness until you know how this medicine affects you. Do not stand or sit up quickly, especially if you are an older patient. This reduces the risk of dizzy or fainting spells. In some cases, you may be given additional medicines to help with side effects. Follow all directions for their use. Call your doctor or health care professional for advice if you get a fever, chills or sore throat, or other symptoms of a cold or flu. Do not treat yourself. This drug decreases your body's ability to fight infections. Try to avoid being around people who are sick. This medicine may increase your risk to bruise or bleed. Call your doctor or health care professional if you notice any unusual bleeding. You may need blood work done while you are taking this medicine. In some patients, this medicine may cause a serious brain infection that may cause death. If you have any problems seeing, thinking, speaking, walking, or standing, tell your doctor right away. If you cannot reach your doctor, urgently seek other source of medical care. Check with your doctor or health care professional if you get an attack of severe diarrhea, nausea and vomiting, or if you sweat a lot. The loss of too much body fluid can make it dangerous for you to take this medicine. Do not become pregnant while taking this medicine or for at least 7 months after stopping it. Women should inform their doctor   if they wish to become pregnant or think they might be pregnant. Men should not father a child while taking this medicine and for at least 4 months after stopping it. There is a potential for serious side effects to an unborn child. Talk to your health care professional or pharmacist for more information. Do not breast-feed an infant while taking this medicine or for 2  months after stopping it. This medicine may interfere with the ability to have a child. You should talk with your doctor or health care professional if you are concerned about your fertility. What side effects may I notice from receiving this medicine? Side effects that you should report to your doctor or health care professional as soon as possible:  allergic reactions like skin rash, itching or hives, swelling of the face, lips, or tongue  breathing problems  changes in hearing  changes in vision  fast, irregular heartbeat  feeling faint or lightheaded, falls  pain, tingling, numbness in the hands or feet  right upper belly pain  seizures  swelling of the ankles, feet, hands  unusual bleeding or bruising  unusually weak or tired  vomiting  yellowing of the eyes or skin Side effects that usually do not require medical attention (report to your doctor or health care professional if they continue or are bothersome):  changes in emotions or moods  constipation  diarrhea  loss of appetite  headache  irritation at site where injected  nausea This list may not describe all possible side effects. Call your doctor for medical advice about side effects. You may report side effects to FDA at 1-800-FDA-1088. Where should I keep my medicine? This drug is given in a hospital or clinic and will not be stored at home. NOTE: This sheet is a summary. It may not cover all possible information. If you have questions about this medicine, talk to your doctor, pharmacist, or health care provider.  2020 Elsevier/Gold Standard (2017-09-08 16:29:31)  

## 2020-03-06 NOTE — Progress Notes (Signed)
Pharmacist Chemotherapy Monitoring - Initial Assessment    Anticipated start date: 03/13/20  Regimen:  . Are orders appropriate based on the patient's diagnosis, regimen, and cycle? Yes . Does the plan date match the patient's scheduled date? Yes . Is the sequencing of drugs appropriate? Yes . Are the premedications appropriate for the patient's regimen? Yes . Prior Authorization for treatment is: Approved o If applicable, is the correct biosimilar selected based on the patient's insurance? not applicable  Organ Function and Labs: Marland Kitchen Are dose adjustments needed based on the patient's renal function, hepatic function, or hematologic function? No . Are appropriate labs ordered prior to the start of patient's treatment? Yes . Other organ system assessment, if indicated: N/A . The following baseline labs, if indicated, have been ordered: N/A  Dose Assessment: . Are the drug doses appropriate? Yes . Are the following correct: o Drug concentrations Yes o IV fluid compatible with drug Yes o Administration routes Yes o Timing of therapy Yes . If applicable, does the patient have documented access for treatment and/or plans for port-a-cath placement? not applicable . If applicable, have lifetime cumulative doses been properly documented and assessed? yes Lifetime Dose Tracking  No doses have been documented on this patient for the following tracked chemicals: Doxorubicin, Epirubicin, Idarubicin, Daunorubicin, Mitoxantrone, Bleomycin, Oxaliplatin, Carboplatin, Liposomal Doxorubicin  o   Toxicity Monitoring/Prevention: . The patient has the following take home antiemetics prescribed: Ondansetron, Prochlorperazine and Dexamethasone . The patient has the following take home medications prescribed: VZV prophylaxis - not ordered sent message to md . Medication allergies and previous infusion related reactions, if applicable, have been reviewed and addressed. Yes . The patient's current medication  list has been assessed for drug-drug interactions with their chemotherapy regimen. no significant drug-drug interactions were identified on review.  Order Review: . Are the treatment plan orders signed? Yes . Is the patient scheduled to see a provider prior to their treatment? Yes  I verify that I have reviewed each item in the above checklist and answered each question accordingly.  Adelina Mings 03/06/2020 2:35 PM

## 2020-03-07 ENCOUNTER — Other Ambulatory Visit: Payer: Self-pay

## 2020-03-07 ENCOUNTER — Inpatient Hospital Stay (HOSPITAL_BASED_OUTPATIENT_CLINIC_OR_DEPARTMENT_OTHER): Payer: Medicare Other | Admitting: Nurse Practitioner

## 2020-03-07 ENCOUNTER — Inpatient Hospital Stay: Payer: Medicare Other

## 2020-03-07 NOTE — Progress Notes (Signed)
Erroneous encounter

## 2020-03-07 NOTE — Telephone Encounter (Signed)
Patient has a $0.00 copay per [facility] pharmacy.  Medication was delivered on 03/03/20.  Bracey Patient [place] Phone [phone] Fax [phone] 03/07/2020 4:22 PM

## 2020-03-10 ENCOUNTER — Other Ambulatory Visit: Payer: Self-pay

## 2020-03-10 ENCOUNTER — Encounter: Payer: Self-pay | Admitting: Oncology

## 2020-03-10 ENCOUNTER — Ambulatory Visit
Admission: RE | Admit: 2020-03-10 | Discharge: 2020-03-10 | Disposition: A | Discharge status: Home / Self Care | Payer: Medicare Other | Source: Ambulatory Visit | Attending: Gastroenterology | Admitting: Gastroenterology

## 2020-03-10 DIAGNOSIS — I7090 Unspecified atherosclerosis: Secondary | ICD-10-CM | POA: Diagnosis present

## 2020-03-10 DIAGNOSIS — K7469 Other cirrhosis of liver: Secondary | ICD-10-CM | POA: Diagnosis present

## 2020-03-11 NOTE — Progress Notes (Signed)
[place]  [phone] [phone]  ID: Heather Peterson OB: 1940/05/14  MR#: [id number]  IWO#:[id number]  [name], MD as PCP - General (Family Medicine) Lloyd Huger, MD as Consulting Physician (Oncology)  CHIEF COMPLAINT: Multiple myeloma.  INTERVAL HISTORY: Patient returns to clinic today for further evaluation and initiation of cycle 1, day 1 of Revlimid and Velcade.  She continues to have persistent weakness and fatigue.  She has chronic shortness of breath and requires oxygen 24 hours/day. She has no neurologic complaints.  She denies any recent fevers.  She has a fair appetite.  She denies any chest pain, cough, or hemoptysis.  She denies any nausea, vomiting, constipation, or diarrhea.  She has no melena or hematochezia.  She has no urinary complaints.  Patient offers no further specific complaints today.  REVIEW OF SYSTEMS:   Review of Systems  Constitutional: Positive for malaise/fatigue. Negative for fever.  Respiratory: Positive for shortness of breath. Negative for cough and hemoptysis.   Cardiovascular: Positive for leg swelling. Negative for chest pain.  Gastrointestinal: Negative.  Negative for abdominal pain, melena and nausea.  Genitourinary: Negative.  Negative for dysuria and hematuria.  Musculoskeletal: Negative.  Negative for back pain.  Skin: Negative.  Negative for rash.  Neurological: Positive for weakness. Negative for dizziness, focal weakness and headaches.  Psychiatric/Behavioral: Negative.  The patient is not nervous/anxious.     As per HPI. Otherwise, a complete review of systems is negative.  PAST MEDICAL HISTORY: Past Medical History:  Diagnosis Date  . Anxiety   . Chronic combined systolic and diastolic CHF (congestive heart failure) ([place])   . Chronic kidney disease   . Coronary artery disease   . Depression   . Diabetes mellitus without complication ([place])   .  Diabetes mellitus, type II ([place])   . Hypertension   . MI (myocardial infarction) ([place])    x 5  . Pacemaker   . Prolonged Q-T interval on ECG   . Thyroid disease     PAST SURGICAL HISTORY: Past Surgical History:  Procedure Laterality Date  . CENTRAL LINE INSERTION  03/11/2017   Procedure: CENTRAL LINE INSERTION;  Surgeon: Leonie Man, MD;  Location: [place] [facility];  Service: Cardiovascular;;  . CHOLECYSTECTOMY    . COLONOSCOPY WITH PROPOFOL N/A 09/01/2019   Procedure: COLONOSCOPY WITH PROPOFOL;  Surgeon: Toledo, Benay Pike, MD;  Location: [facility] ENDOSCOPY;  Service: Gastroenterology;  Laterality: N/A;  . CORONARY STENT INTERVENTION W/IMPELLA N/A 03/11/2017   Procedure: Coronary Stent Intervention w/Impella;  Surgeon: Leonie Man, MD;  Location: [place] [facility];  Service: Cardiovascular;  Laterality: N/A;  . ESOPHAGOGASTRODUODENOSCOPY (EGD) WITH PROPOFOL N/A 09/01/2019   Procedure: ESOPHAGOGASTRODUODENOSCOPY (EGD) WITH PROPOFOL;  Surgeon: Toledo, Benay Pike, MD;  Location: [facility] ENDOSCOPY;  Service: Gastroenterology;  Laterality: N/A;  . ESOPHAGOGASTRODUODENOSCOPY (EGD) WITH PROPOFOL N/A 09/08/2019   Procedure: ESOPHAGOGASTRODUODENOSCOPY (EGD) WITH PROPOFOL;  Surgeon: [name], MD;  Location: [facility] ENDOSCOPY;  Service: Gastroenterology;  Laterality: N/A;  . EYE SURGERY    . INTRAVASCULAR PRESSURE WIRE/FFR STUDY N/A 03/11/2017   Procedure: INTRAVASCULAR PRESSURE WIRE/FFR STUDY;  Surgeon: Leonie Man, MD;  Location: [place] [facility];  Service: Cardiovascular;  Laterality: N/A;  . INTRAVASCULAR ULTRASOUND/IVUS N/A 03/11/2017   Procedure: Intravascular Ultrasound/IVUS;  Surgeon: Leonie Man, MD;  Location: [place] [facility];  Service: Cardiovascular;  Laterality: N/A;  . LEFT HEART CATH AND CORONARY ANGIOGRAPHY N/A 03/05/2017   Procedure:  LEFT HEART CATH AND CORONARY ANGIOGRAPHY;  Surgeon: Isaias Cowman, MD;  Location: [place] [facility];  Service:  Cardiovascular;  Laterality: N/A;  . PACEMAKER IMPLANT    . pacemaker/defibrillator Left     FAMILY HISTORY: Family History  Problem Relation Age of Onset  . Hypertension Father   . Heart attack Father   . Depression Sister   . Depression Brother   . Depression Brother     ADVANCED DIRECTIVES (Y/N):  N  HEALTH MAINTENANCE: Social History   Tobacco Use  . Smoking status: Current Every Day Smoker    Packs/day: 0.25    Types: E-cigarettes, Cigarettes  . Smokeless tobacco: Never Used  Vaping Use  . Vaping Use: Former  Substance Use Topics  . Alcohol use: Not Currently    Comment: occasionally  . Drug use: No     Colonoscopy:  PAP:  Bone density:  Lipid panel:  Allergies  Allergen Reactions  . Celebrex [Celecoxib] Anaphylaxis  . Glipizide Anaphylaxis  . Lisinopril Swelling    Lip and facial swelling  . Sulfa Antibiotics Other (See Comments) and Anaphylaxis    Reaction: unknown  . Levaquin [Levofloxacin In D5w] Other (See Comments)    Heart arrhthymias  . Levofloxacin Other (See Comments)    ICD fired  . Metformin Other (See Comments)    Gi tolerance   . Penicillins Rash and Other (See Comments)    Has patient had a PCN reaction causing immediate rash, facial/tongue/throat swelling, SOB or lightheadedness with hypotension: Unknown Has patient had a PCN reaction causing severe rash involving mucus membranes or skin necrosis: No Has patient had a PCN reaction that required hospitalization: No Has patient had a PCN reaction occurring within the last 10 years: No If all of the above answers are "NO", then may proceed with Cephalosporin use.     Current Outpatient Medications  Medication Sig Dispense Refill  . allopurinol (ZYLOPRIM) 100 MG tablet Take 50 mg by mouth daily.    Marland Kitchen aspirin EC 81 MG tablet Take 81 mg by mouth at bedtime.     Marland Kitchen dexamethasone (DECADRON) 4 MG tablet Take 5 tablets (20 mg total) by mouth daily. Take the day after Velcade on days 2,5,9,12.  Take with breakfast 40 tablet 3  . fexofenadine (ALLEGRA) 180 MG tablet Take 180 mg by mouth daily.    Marland Kitchen FLUoxetine (PROZAC) 10 MG capsule Take 10 mg by mouth daily.    Marland Kitchen lenalidomide (REVLIMID) 10 MG capsule Take 1 capsule (10 mg total) by mouth daily. Take 14 days on, 7 days off, repeat every 21 days 14 capsule 0  . levothyroxine (SYNTHROID) 50 MCG tablet Take 50 mcg by mouth See admin instructions. Take 1 tablet (31mg) by mouth every Tuesday, Thursday, Saturday and Sunday before breakfast    . levothyroxine (SYNTHROID, LEVOTHROID) 75 MCG tablet Take 75 mcg by mouth See admin instructions. Take 1 tablet (760m) by mouth every Monday, Wednesday and Friday morning before breakfast    . magnesium oxide (MAG-OX) 400 MG tablet Take 200 mg by mouth daily.    . Marland Kitchenexiletine (MEXITIL) 200 MG capsule Take 1 capsule (200 mg total) by mouth every 12 (twelve) hours. 60 capsule 1  . midodrine (PROAMATINE) 5 MG tablet Take 5 mg by mouth daily as needed.     . ondansetron (ZOFRAN) 8 MG tablet Take 1 tablet (8 mg total) by mouth 2 (two) times daily as needed (Nausea or vomiting). 60 tablet 2  . OXYGEN Inhale 2  L into the lungs continuous.    . pantoprazole (PROTONIX) 40 MG tablet Take 40 mg by mouth daily.     . Prenatal Vit-Fe Fumarate-FA (PRENATAL MULTIVITAMIN) TABS tablet Take 1 tablet by mouth daily.    . prochlorperazine (COMPAZINE) 10 MG tablet Take 1 tablet (10 mg total) by mouth every 6 (six) hours as needed (Nausea or vomiting). 60 tablet 2  . simvastatin (ZOCOR) 40 MG tablet Take 20 mg by mouth at bedtime.   2  . sucralfate (CARAFATE) 1 g tablet Take 1 tablet by mouth 3 (three) times daily before meals.    . torsemide (DEMADEX) 20 MG tablet Take 2 tablets (40 mg total) by mouth daily. Future refills need to come from kidney doctor 60 tablet 1  . traZODone (DESYREL) 50 MG tablet Take 50 mg by mouth at bedtime.     No current facility-administered medications for this visit.    OBJECTIVE: Vitals:    03/13/20 0935  BP: 107/66  Pulse: 88  Temp: 97.6 F (36.4 C)  SpO2: 100%     Body mass index is 30.9 kg/m.    ECOG FS:2 - Symptomatic, <50% confined to bed  General: Well-developed, well-nourished, no acute distress.  Sitting in a wheelchair. Eyes: Pink conjunctiva, anicteric sclera. HEENT: Normocephalic, moist mucous membranes. Lungs: No audible wheezing or coughing. Heart: Regular rate and rhythm. Abdomen: Soft, nontender, no obvious distention. Musculoskeletal: No edema, cyanosis, or clubbing. Neuro: Alert, answering all questions appropriately. Cranial nerves grossly intact. Skin: No rashes or petechiae noted. Psych: Normal affect.    LAB RESULTS:  Lab Results  Component Value Date   NA 137 03/13/2020   K 3.7 03/13/2020   CL 97 (L) 03/13/2020   CO2 27 03/13/2020   GLUCOSE 117 (H) 03/13/2020   BUN 43 (H) 03/13/2020   CREATININE 2.58 (H) 03/13/2020   CALCIUM 8.9 03/13/2020   PROT 7.9 03/13/2020   ALBUMIN 3.6 03/13/2020   AST 25 03/13/2020   ALT 14 03/13/2020   ALKPHOS 99 03/13/2020   BILITOT 0.8 03/13/2020   GFRNONAA 18 (L) 03/13/2020   GFRAA 30 (L) 09/09/2019    Lab Results  Component Value Date   WBC 7.2 03/13/2020   NEUTROABS 4.7 03/13/2020   HGB 8.3 (L) 03/13/2020   HCT 27.0 (L) 03/13/2020   MCV 98.9 03/13/2020   PLT 204 03/13/2020   Lab Results  Component Value Date   IRON 56 03/13/2020   TIBC 430 03/13/2020   IRONPCTSAT 13 03/13/2020   Lab Results  Component Value Date   FERRITIN 20 03/13/2020     STUDIES: US Abdomen Complete  Result Date: 03/11/2020 CLINICAL DATA:  Cirrhosis EXAM: ABDOMEN ULTRASOUND COMPLETE COMPARISON:  CT 07/16/2019 FINDINGS: Gallbladder: Prior cholecystectomy Common bile duct: Diameter: Normal caliber, 3 mm Liver: Diffusely increased echotexture and nodular contours. No focal hepatic abnormality. Portal vein is patent on color Doppler imaging with normal direction of blood flow towards the liver. IVC: No abnormality  visualized. Pancreas: Visualized portion unremarkable. Spleen: Size and appearance within normal limits. Right Kidney: Length: 8.4 cm. Echogenicity within normal limits. No mass or hydronephrosis visualized. Left Kidney: Length: 8.8 cm. Echogenicity within normal limits. No mass or hydronephrosis visualized. Abdominal aorta: No aneurysm visualized. Other findings: Moderate ascites. IMPRESSION: Changes of hepatic steatosis and cirrhosis. Moderate ascites. No focal hepatic abnormality. Electronically Signed   By: Rolm Baptise M.D.   On: 03/11/2020 20:44   DG Bone Survey Met  Result Date: 03/04/2020 CLINICAL DATA:  Multiple myeloma  EXAM: METASTATIC BONE SURVEY COMPARISON:  None. FINDINGS: Metastatic bone survey was performed. Cardiac shadow is enlarged. Defibrillator is noted. The lungs are clear. No definitive rib abnormality is noted. Cervical spine demonstrates multilevel degenerative change. No lytic or sclerotic lesions are seen. The skull demonstrates a single lucency in the parietal bone consistent with the given clinical history. Upper extremities show no significant lytic lesions. Thoracic spine demonstrates vertebral body height to be well maintained. No paraspinal mass or pedicle abnormality is noted. Degenerative changes of lumbar spine are seen. No lytic lesion is noted. No compression deformities are noted. Scattered lytic lesions are noted within the pubic rami as well as the proximal femurs bilaterally. Distal lower extremities show no definitive lytic or sclerotic lesions. IMPRESSION: Scattered lucencies are noted as described consistent with the given clinical history of myeloma. Electronically Signed   By: Inez Catalina M.D.   On: 03/04/2020 19:59   CT BONE MARROW BIOPSY & ASPIRATION  Result Date: 02/21/2020 INDICATION: 79 year old female with history of anemia. EXAM: CT-GUIDED BONE MARROW BIOPSY AND ASPIRATION MEDICATIONS: None ANESTHESIA/SEDATION: Fentanyl 50 mcg IV; Versed 1 mg IV  Sedation Time: 11 minutes; The patient was continuously monitored during the procedure by the interventional radiology nurse under my direct supervision. COMPLICATIONS: None immediate. PROCEDURE: Informed consent was obtained from the patient following an explanation of the procedure, risks, benefits and alternatives. The patient understands, agrees and consents for the procedure. All questions were addressed. A time out was performed prior to the initiation of the procedure. The patient was positioned prone and non-contrast localization CT was performed of the pelvis to demonstrate the iliac marrow spaces. The operative site was prepped and draped in the usual sterile fashion. Under sterile conditions and local anesthesia, a 22 gauge spinal needle was utilized for procedural planning. Next, an 11 gauge coaxial bone biopsy needle was advanced into the right iliac marrow space. Needle position was confirmed with CT imaging. Initially, a bone marrow aspiration was performed. Next, a bone marrow biopsy was obtained with the 11 gauge outer bone marrow device. The 11 gauge coaxial bone biopsy needle was re-advanced into a slightly different location within the left iliac marrow space, positioning was confirmed with CT imaging and an additional bone marrow biopsy was obtained. Samples were prepared with the cytotechnologist and deemed adequate. The needle was removed and superficial hemostasis was obtained with manual compression. A dressing was applied. The patient tolerated the procedure well without immediate post procedural complication. IMPRESSION: Successful CT guided right iliac bone marrow aspiration and core biopsy. Ruthann Cancer, MD [facility] Electronically Signed   By: Ruthann Cancer MD   On: 02/21/2020 09:59    ASSESSMENT: Multiple myeloma.  PLAN:    1.  Multiple myeloma: Bone marrow biopsy confirmed diagnosis with plasma cells up to 90% of  biopsy.  Cytogenetics are reported as normal.  Metastatic bone survey from March 03, 2020 reviewed independently and report as above with multiple skeletal lucencies consistent with myeloma.  M spike is only mildly elevated at 1.2, but she has a significantly elevated IgM level of 2773.  Kappa and lambda free light chains are within normal limits.  Patient noted to have endorgan damage of renal insufficiency as well as persistent anemia.  She will benefit from Velcade on days 1, 4, 8, and 11 along with 10 mg Revlimid on days 1 through 14.  Revlimid has been dose reduced secondary to renal insufficiency.  Proceed with cycle 1, day  1 of treatment today.  Return to clinic on Thursday for Velcade only and then in 1 week for further evaluation and consideration of cycle 1, day 8. 2.  Anemia: Likely multifactorial with chronic renal insufficiency, underlying myeloma, as well as history of recent GI bleed.  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 She does not require transfusion or IV iron at this time, but this may be necessary in the future.  Hemoglobin 8.3 today. 3.  Chronic renal insufficiency: Patient's creatinine continues to trend up and is now 2.58.  Dose reduced Revlimid as above.  Velcade does not need to be dosed reduced and renal dysfunction.  Monitor.   4.  Ascites: Continue follow-up with heart failure clinic as scheduled. 5.  Angiodysplastic lesions: Continue follow-up with GI as scheduled.   Patient expressed understanding and was in agreement with this plan. She also understands that She can call clinic at any time with any questions, concerns, or complaints.    Lloyd Huger, MD   03/13/2020 2:10 PM

## 2020-03-13 ENCOUNTER — Inpatient Hospital Stay: Payer: Medicare Other | Attending: Oncology

## 2020-03-13 ENCOUNTER — Other Ambulatory Visit: Payer: Self-pay

## 2020-03-13 ENCOUNTER — Inpatient Hospital Stay: Payer: Medicare Other

## 2020-03-13 ENCOUNTER — Inpatient Hospital Stay: Payer: Medicare Other | Admitting: Pharmacist

## 2020-03-13 ENCOUNTER — Inpatient Hospital Stay (HOSPITAL_BASED_OUTPATIENT_CLINIC_OR_DEPARTMENT_OTHER): Payer: Medicare Other | Admitting: Oncology

## 2020-03-13 ENCOUNTER — Encounter: Payer: Self-pay | Admitting: Oncology

## 2020-03-13 VITALS — BP 107/66 | HR 88 | Temp 97.6°F | Wt 180.0 lb

## 2020-03-13 VITALS — BP 103/68 | HR 85 | Temp 96.6°F | Resp 19

## 2020-03-13 DIAGNOSIS — C9000 Multiple myeloma not having achieved remission: Secondary | ICD-10-CM

## 2020-03-13 DIAGNOSIS — R197 Diarrhea, unspecified: Secondary | ICD-10-CM | POA: Insufficient documentation

## 2020-03-13 DIAGNOSIS — D649 Anemia, unspecified: Secondary | ICD-10-CM | POA: Diagnosis not present

## 2020-03-13 DIAGNOSIS — Z5111 Encounter for antineoplastic chemotherapy: Secondary | ICD-10-CM | POA: Diagnosis not present

## 2020-03-13 DIAGNOSIS — Z79899 Other long term (current) drug therapy: Secondary | ICD-10-CM | POA: Insufficient documentation

## 2020-03-13 DIAGNOSIS — N189 Chronic kidney disease, unspecified: Secondary | ICD-10-CM | POA: Diagnosis not present

## 2020-03-13 LAB — COMPREHENSIVE METABOLIC PANEL
ALT: 14 U/L (ref 0–44)
AST: 25 U/L (ref 15–41)
Albumin: 3.6 g/dL (ref 3.5–5.0)
Alkaline Phosphatase: 99 U/L (ref 38–126)
Anion gap: 13 (ref 5–15)
BUN: 43 mg/dL — ABNORMAL HIGH (ref 8–23)
CO2: 27 mmol/L (ref 22–32)
Calcium: 8.9 mg/dL (ref 8.9–10.3)
Chloride: 97 mmol/L — ABNORMAL LOW (ref 98–111)
Creatinine, Ser: 2.58 mg/dL — ABNORMAL HIGH (ref 0.44–1.00)
GFR, Estimated: 18 mL/min — ABNORMAL LOW (ref >60)
Glucose, Bld: 117 mg/dL — ABNORMAL HIGH (ref 70–99)
Potassium: 3.7 mmol/L (ref 3.5–5.1)
Sodium: 137 mmol/L (ref 135–145)
Total Bilirubin: 0.8 mg/dL (ref 0.3–1.2)
Total Protein: 7.9 g/dL (ref 6.5–8.1)

## 2020-03-13 LAB — CBC WITH DIFFERENTIAL/PLATELET
Abs Immature Granulocytes: 0.08 10*3/uL — ABNORMAL HIGH (ref 0.00–0.07)
Basophils Absolute: 0 10*3/uL (ref 0.0–0.1)
Basophils Relative: 1 %
Eosinophils Absolute: 0.1 10*3/uL (ref 0.0–0.5)
Eosinophils Relative: 2 %
HCT: 27 % — ABNORMAL LOW (ref 36.0–46.0)
Hemoglobin: 8.3 g/dL — ABNORMAL LOW (ref 12.0–15.0)
Immature Granulocytes: 1 %
Lymphocytes Relative: 22 %
Lymphs Abs: 1.6 10*3/uL (ref 0.7–4.0)
MCH: 30.4 pg (ref 26.0–34.0)
MCHC: 30.7 g/dL (ref 30.0–36.0)
MCV: 98.9 fL (ref 80.0–100.0)
Monocytes Absolute: 0.7 10*3/uL (ref 0.1–1.0)
Monocytes Relative: 10 %
Neutro Abs: 4.7 10*3/uL (ref 1.7–7.7)
Neutrophils Relative %: 64 %
Platelets: 204 10*3/uL (ref 150–400)
RBC: 2.73 MIL/uL — ABNORMAL LOW (ref 3.87–5.11)
RDW: 19.7 % — ABNORMAL HIGH (ref 11.5–15.5)
WBC: 7.2 10*3/uL (ref 4.0–10.5)
nRBC: 0.3 % — ABNORMAL HIGH (ref 0.0–0.2)

## 2020-03-13 LAB — IRON AND TIBC
Iron: 56 ug/dL (ref 28–170)
Saturation Ratios: 13 % (ref 10.4–31.8)
TIBC: 430 ug/dL (ref 250–450)
UIBC: 374 ug/dL

## 2020-03-13 LAB — LACTATE DEHYDROGENASE: LDH: 165 U/L (ref 98–192)

## 2020-03-13 LAB — FERRITIN: Ferritin: 20 ng/mL (ref 11–307)

## 2020-03-13 MED ORDER — BORTEZOMIB CHEMO SQ INJECTION 3.5 MG (2.5MG/ML)
1.3000 mg/m2 | Freq: Once | INTRAMUSCULAR | Status: AC
  Administered 2020-03-13: 2.5 mg via SUBCUTANEOUS
  Filled 2020-03-13: qty 1

## 2020-03-13 MED ORDER — DEXAMETHASONE 4 MG PO TABS
20.0000 mg | ORAL_TABLET | Freq: Once | ORAL | Status: AC
  Administered 2020-03-13: 20 mg via ORAL
  Filled 2020-03-13: qty 5

## 2020-03-13 NOTE — Progress Notes (Signed)
Would like to discuss revlimid today at appointment.

## 2020-03-13 NOTE — Progress Notes (Signed)
Per Melissa CMA per Dr, Grayland Ormond all labs and VS have been reviewed, including Creatinine of 2.58. Per Dr. Grayland Ormond okay to proceed with Velcade treatment.   1132: pt tolerated treatment well. No s/s of distress or reaction noted. Injection site WNL, with no swelling, redness or pain noted. Pt and VS sable at discharge.

## 2020-03-13 NOTE — Progress Notes (Signed)
[place]  [phone] [phone]  [name], MD as PCP - General (Family Medicine) Lloyd Huger, MD as Consulting Physician (Oncology)   Name of the patient: Heather Peterson  [id number]  [birth year]   Date of visit: 03/13/20  HPI: Patient is a 79 y.o. female with newly diagnosed multiple myeloma. Starting RVd today.   Reason for Consult: Oral chemotherapy Revlimid (lenalidomide) education therapy.   PAST MEDICAL HISTORY: Past Medical History:  Diagnosis Date  . Anxiety   . Chronic combined systolic and diastolic CHF (congestive heart failure) ([place])   . Chronic kidney disease   . Coronary artery disease   . Depression   . Diabetes mellitus without complication ([place])   . Diabetes mellitus, type II ([place])   . Hypertension   . MI (myocardial infarction) ([place])    x 5  . Pacemaker   . Prolonged Q-T interval on ECG   . Thyroid disease     PAST SURGICAL HISTORY:  Past Surgical History:  Procedure Laterality Date  . CENTRAL LINE INSERTION  03/11/2017   Procedure: CENTRAL LINE INSERTION;  Surgeon: Leonie Man, MD;  Location: [place] [facility];  Service: Cardiovascular;;  . CHOLECYSTECTOMY    . COLONOSCOPY WITH PROPOFOL N/A 09/01/2019   Procedure: COLONOSCOPY WITH PROPOFOL;  Surgeon: Toledo, Benay Pike, MD;  Location: [facility]OS[facility];  Service: Gastroenterology;  Laterality: N/A;  . CORONARY STENT INTERVENTION W/IMPELLA N/A 03/11/2017   Procedure: Coronary Stent Intervention w/Impella;  Surgeon: Leonie Man, MD;  Location: [place] [facility];  Service: Cardiovascular;  Laterality: N/A;  . ESOPHAGOGASTRODUODENOSCOPY (EGD) WITH PROPOFOL N/A 09/01/2019   Procedure: ESOPHAGOGASTRODUODENOSCOPY (EGD) WITH PROPOFOL;  Surgeon: Toledo, Benay Pike, MD;  Location: [facility] ENDOSCOPY;  Service: Gastroenterology;  Laterality: N/A;  . ESOPHAGOGASTRODUODENOSCOPY (EGD) WITH PROPOFOL N/A  09/08/2019   Procedure: ESOPHAGOGASTRODUODENOSCOPY (EGD) WITH PROPOFOL;  Surgeon: [name], MD;  Location: [facility] END[facility]COPY;  Service: Gastroenterology;  Laterality: N/A;  . EYE SURGERY    . INTRAVASCULAR PRESSURE WIRE/FFR STUDY N/A 03/11/2017   Procedure: INTRAVASCULAR PRESSURE WIRE/FFR STUDY;  Surgeon: Leonie Man, MD;  Location: [place] [facility];  Service: Cardiovascular;  Laterality: N/A;  . INTRAVASCULAR ULTRASOUND/IVUS N/A 03/11/2017   Procedure: Intravascular Ultrasound/IVUS;  Surgeon: Leonie Man, MD;  Location: [place] [facility];  Service: Cardiovascular;  Laterality: N/A;  . LEFT HEART CATH AND CORONARY ANGIOGRAPHY N/A 03/05/2017   Procedure: LEFT HEART CATH AND CORONARY ANGIOGRAPHY;  Surgeon: Isaias Cowman, MD;  Location: [place] [facility];  Service: Cardiovascular;  Laterality: N/A;  . PACEMAKER IMPLANT    . pacemaker/defibrillator Left     HEMATOLOGY/ONCOLOGY HISTORY:  Oncology History  Multiple myeloma ([place])  03/01/2020 Initial Diagnosis   Multiple myeloma ([place])   03/13/2020 -  Chemotherapy   The patient had dexamethasone (DECADRON) tablet 20 mg, 20 mg, Oral,  Once, 1 of 8 cycles dexamethasone (DECADRON) 4 MG tablet, 20 mg, Oral, Daily, 1 of 1 cycle, Start date: 03/01/2020, End date: -- lenalidomide (REVLIMID) 10 MG capsule, 10 mg, Oral, Daily, 1 of 1 cycle, Start date: 03/01/2020, End date: -- bortezomib SQ (VELCADE) chemo injection (2.24m/mL concentration) 2.5 mg, 1.3 mg/m2 = 2.5 mg, Subcutaneous,  Once, 1 of 8 cycles  for chemotherapy treatment.      ALLERGIES:  is allergic to celebrex [celecoxib], glipizide, lisinopril, sulfa antibiotics, levaquin [levofloxacin in d5w], levofloxacin, metformin, and penicillins.  MEDICATIONS:  Current Outpatient Medications  Medication Sig  Dispense Refill  . allopurinol (ZYLOPRIM) 100 MG tablet Take 50 mg by mouth daily.    Marland Kitchen aspirin EC 81 MG tablet Take 81 mg by mouth at bedtime.     Marland Kitchen dexamethasone  (DECADRON) 4 MG tablet Take 5 tablets (20 mg total) by mouth daily. Take the day after Velcade on days 2,5,9,12. Take with breakfast 40 tablet 3  . fexofenadine (ALLEGRA) 180 MG tablet Take 180 mg by mouth daily.    Marland Kitchen FLUoxetine (PROZAC) 10 MG capsule Take 10 mg by mouth daily.    Marland Kitchen lenalidomide (REVLIMID) 10 MG capsule Take 1 capsule (10 mg total) by mouth daily. Take 14 days on, 7 days off, repeat every 21 days 14 capsule 0  . levothyroxine (SYNTHROID) 50 MCG tablet Take 50 mcg by mouth See admin instructions. Take 1 tablet (60mg) by mouth every Tuesday, Thursday, Saturday and Sunday before breakfast    . levothyroxine (SYNTHROID, LEVOTHROID) 75 MCG tablet Take 75 mcg by mouth See admin instructions. Take 1 tablet (770m) by mouth every Monday, Wednesday and Friday morning before breakfast    . magnesium oxide (MAG-OX) 400 MG tablet Take 200 mg by mouth daily.    . Marland Kitchenexiletine (MEXITIL) 200 MG capsule Take 1 capsule (200 mg total) by mouth every 12 (twelve) hours. 60 capsule 1  . midodrine (PROAMATINE) 5 MG tablet Take 5 mg by mouth daily as needed.     . ondansetron (ZOFRAN) 8 MG tablet Take 1 tablet (8 mg total) by mouth 2 (two) times daily as needed (Nausea or vomiting). 60 tablet 2  . OXYGEN Inhale 2 L into the lungs continuous.    . pantoprazole (PROTONIX) 40 MG tablet Take 40 mg by mouth daily.     . Prenatal Vit-Fe Fumarate-FA (PRENATAL MULTIVITAMIN) TABS tablet Take 1 tablet by mouth daily.    . prochlorperazine (COMPAZINE) 10 MG tablet Take 1 tablet (10 mg total) by mouth every 6 (six) hours as needed (Nausea or vomiting). 60 tablet 2  . simvastatin (ZOCOR) 40 MG tablet Take 20 mg by mouth at bedtime.   2  . sucralfate (CARAFATE) 1 g tablet Take 1 tablet by mouth 3 (three) times daily before meals.    . torsemide (DEMADEX) 20 MG tablet Take 2 tablets (40 mg total) by mouth daily. Future refills need to come from kidney doctor 60 tablet 1  . traZODone (DESYREL) 50 MG tablet Take 50 mg by  mouth at bedtime.     No current facility-administered medications for this visit.   Facility-Administered Medications Ordered in Other Visits  Medication Dose Route Frequency Provider Last Rate Last Admin  . bortezomib SQ (VELCADE) chemo injection (2.49m60mL concentration) 2.5 mg  1.3 mg/m2 (Treatment Plan Recorded) Subcutaneous Once FinLloyd HugerD      . dexamethasone (DECADRON) tablet 20 mg  20 mg Oral Once FinLloyd HugerD        VITAL SIGNS: There were no vitals taken for this visit. There were no vitals filed for this visit.  Estimated body mass index is 30.9 kg/m as calculated from the following:   Height as of 02/21/20: 5' 4"  (1.626 m).   Weight as of an earlier encounter on 03/13/20: 81.6 kg (180 lb).  LABS: CBC:    Component Value Date/Time   WBC 7.2 03/13/2020 0910   HGB 8.3 (L) 03/13/2020 0910   HGB 10.0 (L) 07/01/2017 1213   HCT 27.0 (L) 03/13/2020 0910   HCT 31.1 (L) 07/01/2017 1213  PLT 204 03/13/2020 0910   PLT 299 07/01/2017 1213   MCV 98.9 03/13/2020 0910   MCV 87 07/01/2017 1213   NEUTROABS 4.7 03/13/2020 0910   NEUTROABS 4.7 07/01/2017 1213   LYMPHSABS 1.6 03/13/2020 0910   LYMPHSABS 2.3 07/01/2017 1213   MONOABS 0.7 03/13/2020 0910   EOSABS 0.1 03/13/2020 0910   EOSABS 0.1 07/01/2017 1213   BASOSABS 0.0 03/13/2020 0910   BASOSABS 0.0 07/01/2017 1213   Comprehensive Metabolic Panel:    Component Value Date/Time   NA 137 03/13/2020 0910   NA 140 03/31/2017 0940   K 3.7 03/13/2020 0910   CL 97 (L) 03/13/2020 0910   CO2 27 03/13/2020 0910   BUN 43 (H) 03/13/2020 0910   BUN 31 (H) 03/31/2017 0940   CREATININE 2.58 (H) 03/13/2020 0910   GLUCOSE 117 (H) 03/13/2020 0910   CALCIUM 8.9 03/13/2020 0910   CALCIUM 8.6 (L) 09/07/2019 0951   AST 25 03/13/2020 0910   ALT 14 03/13/2020 0910   ALKPHOS 99 03/13/2020 0910   BILITOT 0.8 03/13/2020 0910   PROT 7.9 03/13/2020 0910   ALBUMIN 3.6 03/13/2020 0910    RADIOGRAPHIC STUDIES: US  Abdomen Complete  Result Date: 03/11/2020 CLINICAL DATA:  Cirrhosis EXAM: ABDOMEN ULTRASOUND COMPLETE COMPARISON:  CT 07/16/2019 FINDINGS: Gallbladder: Prior cholecystectomy Common bile duct: Diameter: Normal caliber, 3 mm Liver: Diffusely increased echotexture and nodular contours. No focal hepatic abnormality. Portal vein is patent on color Doppler imaging with normal direction of blood flow towards the liver. IVC: No abnormality visualized. Pancreas: Visualized portion unremarkable. Spleen: Size and appearance within normal limits. Right Kidney: Length: 8.4 cm. Echogenicity within normal limits. No mass or hydronephrosis visualized. Left Kidney: Length: 8.8 cm. Echogenicity within normal limits. No mass or hydronephrosis visualized. Abdominal aorta: No aneurysm visualized. Other findings: Moderate ascites. IMPRESSION: Changes of hepatic steatosis and cirrhosis. Moderate ascites. No focal hepatic abnormality. Electronically Signed   By: Rolm Baptise M.D.   On: 03/11/2020 20:44   DG Bone Survey Met  Result Date: 03/04/2020 CLINICAL DATA:  Multiple myeloma EXAM: METASTATIC BONE SURVEY COMPARISON:  None. FINDINGS: Metastatic bone survey was performed. Cardiac shadow is enlarged. Defibrillator is noted. The lungs are clear. No definitive rib abnormality is noted. Cervical spine demonstrates multilevel degenerative change. No lytic or sclerotic lesions are seen. The skull demonstrates a single lucency in the parietal bone consistent with the given clinical history. Upper extremities show no significant lytic lesions. Thoracic spine demonstrates vertebral body height to be well maintained. No paraspinal mass or pedicle abnormality is noted. Degenerative changes of lumbar spine are seen. No lytic lesion is noted. No compression deformities are noted. Scattered lytic lesions are noted within the pubic rami as well as the proximal femurs bilaterally. Distal lower extremities show no definitive lytic or sclerotic  lesions. IMPRESSION: Scattered lucencies are noted as described consistent with the given clinical history of myeloma. Electronically Signed   By: Inez Catalina M.D.   On: 03/04/2020 19:59   CT BONE MARROW BIOPSY & ASPIRATION  Result Date: 02/21/2020 INDICATION: 79 year old female with history of anemia. EXAM: CT-GUIDED BONE MARROW BIOPSY AND ASPIRATION MEDICATIONS: None ANESTHESIA/SEDATION: Fentanyl 50 mcg IV; Versed 1 mg IV Sedation Time: 11 minutes; The patient was continuously monitored during the procedure by the interventional radiology nurse under my direct supervision. COMPLICATIONS: None immediate. PROCEDURE: Informed consent was obtained from the patient following an explanation of the procedure, risks, benefits and alternatives. The patient understands, agrees and consents for the procedure. All  questions were addressed. A time out was performed prior to the initiation of the procedure. The patient was positioned prone and non-contrast localization CT was performed of the pelvis to demonstrate the iliac marrow spaces. The operative site was prepped and draped in the usual sterile fashion. Under sterile conditions and local anesthesia, a 22 gauge spinal needle was utilized for procedural planning. Next, an 11 gauge coaxial bone biopsy needle was advanced into the right iliac marrow space. Needle position was confirmed with CT imaging. Initially, a bone marrow aspiration was performed. Next, a bone marrow biopsy was obtained with the 11 gauge outer bone marrow device. The 11 gauge coaxial bone biopsy needle was re-advanced into a slightly different location within the left iliac marrow space, positioning was confirmed with CT imaging and an additional bone marrow biopsy was obtained. Samples were prepared with the cytotechnologist and deemed adequate. The needle was removed and superficial hemostasis was obtained with manual compression. A dressing was applied. The patient tolerated the procedure well  without immediate post procedural complication. IMPRESSION: Successful CT guided right iliac bone marrow aspiration and core biopsy. Ruthann Cancer, MD [facility] Electronically Signed   By: Ruthann Cancer MD   On: 02/21/2020 09:59     Assessment and Plan-  Patient will take her first dose of Revlimid tonight at bedtime.   Patient Education I spoke with patient and her husband following her MD visit for overview of new oral chemotherapy medication: Revlimid (lenalidomide) for the treatment of newly diagnosed multiple myeloma in conjunction with bortezomib and dexamethasone, planned duration until disease control or unacceptable drug toxicity.   Counseled patient on administration, dosing, side effects, monitoring, drug-food interactions, safe handling, storage, and disposal. Patient will take 1 capsule (10 mg total) by mouth daily. Take 14 days on, 7 days off, repeat every 21 days.  Also enforced the importance of continuing to take her aspirin 45m daily  Side effects include but not limited to: decreased wbc/hgb/plt, rash, diarrhea or constipation, N/V, fatigue.    Reviewed with patient importance of keeping a medication schedule and plan for any missed doses.  After discussion with patient no patient barriers to medication adherence identified.   [facility]. Patient knows to call the office with questions or concerns. Oral Chemotherapy Navigation Clinic will continue to follow.  Medication Access Issues: No issues, patient fills her Revlimid at [facility] and they signed her up for a grant to help with the copay.  No patient barriers to medication adherence identified. Medication calendar provided.  Patient expressed understanding and was in agreement with this plan. She also understands that She can call clinic at any time with any questions, concerns, or complaints.    Thank you for allowing me to participate in the care of this very pleasant patient.   Time Total: 129ms  Visit consisted of counseling and education on dealing with issues of symptom management in the setting of serious and potentially life-threatening illness.Greater than 50%  of this time was spent counseling and coordinating care related to the above assessment and plan.  Signed by: AlDarl PikesPharmD, BCPS, BCSalley SlaughterCPP Hematology/Oncology Clinical Pharmacist Practitioner [phone]/05/2019 10:38 AM

## 2020-03-14 ENCOUNTER — Other Ambulatory Visit (HOSPITAL_COMMUNITY): Payer: Self-pay | Admitting: Oncology

## 2020-03-14 ENCOUNTER — Telehealth: Payer: Self-pay | Admitting: *Deleted

## 2020-03-14 DIAGNOSIS — R059 Cough, unspecified: Secondary | ICD-10-CM

## 2020-03-14 LAB — BETA 2 MICROGLOBULIN, SERUM: Beta-2 Microglobulin: 10.5 mg/L — ABNORMAL HIGH (ref 0.6–2.4)

## 2020-03-14 MED ORDER — HYDROCOD POLST-CPM POLST ER 10-8 MG/5ML PO SUER
5.0000 mL | Freq: Two times a day (BID) | ORAL | 0 refills | Status: DC | PRN
Start: 2020-03-14 — End: 2020-06-07

## 2020-03-14 NOTE — Telephone Encounter (Signed)
Patient daughter called reporting that her cough got worse last night, it was loud and deep, but nonproductive. She reports that patient had chemotherapy yesterday and she is concerned about this. Please advise

## 2020-03-14 NOTE — Progress Notes (Signed)
RE: Cough   Mrs. Audie Pinto has past medical history significant for CAD, hypertension, heart failure, MI, COPD, diabetes, hypothyroidism, chronic kidney disease and tobacco abuse.  She is followed by Dr. Grayland Ormond for multiple myeloma.  She started her first cycle of RVD on 03/13/20.  Her first Velcade injection was yesterday.  On dexamethasone 20 mg x 5 days.  Appears to be tolerating okay.  Patient's daughter called clinic expressing concerns about her mother's cough.  Cough has been present for several months but appears to be worsening as of last night.  It is nonproductive but described as deep.  Patient states she has drainage in the back of her throat which causes her to gag and cough.  She has tried Robitussin at bedtime which initially was helping.  No increased oxygen requirements.  She denies any fevers or sputum production.  She denies any worsening shortness of breath or work of breathing.  She is O2 dependent.  She still smokes.  Cough is worse at night.  Has not had any recent chest imaging.  Denies any exposure to Covid.  Plan: -I have asked that she have a chest x-ray ASAP to rule out infection versus Covid.  Patient and daughter agreeable.  -Orders placed for chest x-ray. -Prescription for Tussionex twice daily sent to pharmacy.   Disposition: -Will call patient with results of chest x-ray -If symptoms worsen patient to call clinic or be evaluated in the emergency room.  -Otherwise, keep scheduled appointments.  Faythe Casa, NP 03/14/2020 12:22 PM

## 2020-03-14 NOTE — Telephone Encounter (Signed)
I am calling her daughter now!  Faythe Casa, NP 03/14/2020 11:31 AM

## 2020-03-15 ENCOUNTER — Other Ambulatory Visit: Payer: Self-pay

## 2020-03-15 ENCOUNTER — Ambulatory Visit
Admission: RE | Admit: 2020-03-15 | Discharge: 2020-03-15 | Disposition: A | Discharge status: Home / Self Care | Payer: Medicare Other | Attending: Oncology | Admitting: Oncology

## 2020-03-15 ENCOUNTER — Ambulatory Visit
Admission: RE | Admit: 2020-03-15 | Discharge: 2020-03-15 | Disposition: A | Discharge status: Home / Self Care | Payer: Medicare Other | Source: Ambulatory Visit | Attending: Oncology | Admitting: Oncology

## 2020-03-15 ENCOUNTER — Telehealth: Payer: Self-pay | Admitting: Oncology

## 2020-03-15 DIAGNOSIS — R059 Cough, unspecified: Secondary | ICD-10-CM | POA: Diagnosis not present

## 2020-03-15 NOTE — Telephone Encounter (Signed)
Re: Chest X-ray   Called and spoke to daughter [name] about recent chest x-ray results.  No active cardio pulmonary infection.  Daughter states that the Tussionex did help her cough last night.  They still have concerns about why she is coughing so much.  I asked him to keep an eye on her and let us know if her symptoms worsened.  Faythe Casa, NP 03/15/2020 1:49 PM

## 2020-03-16 ENCOUNTER — Inpatient Hospital Stay: Payer: Medicare Other

## 2020-03-16 VITALS — BP 107/70 | HR 94 | Temp 98.1°F | Resp 18

## 2020-03-16 DIAGNOSIS — C9000 Multiple myeloma not having achieved remission: Secondary | ICD-10-CM

## 2020-03-16 DIAGNOSIS — Z5111 Encounter for antineoplastic chemotherapy: Secondary | ICD-10-CM | POA: Diagnosis not present

## 2020-03-16 MED ORDER — DEXAMETHASONE 4 MG PO TABS
20.0000 mg | ORAL_TABLET | Freq: Once | ORAL | Status: AC
  Administered 2020-03-16: 20 mg via ORAL
  Filled 2020-03-16: qty 5

## 2020-03-16 MED ORDER — BORTEZOMIB CHEMO SQ INJECTION 3.5 MG (2.5MG/ML)
1.3000 mg/m2 | Freq: Once | INTRAMUSCULAR | Status: AC
  Administered 2020-03-16: 2.5 mg via SUBCUTANEOUS
  Filled 2020-03-16: qty 1

## 2020-03-16 NOTE — Progress Notes (Signed)
Patient c/o "feeling like something is crawling under my skin" and trouble sleeping since receiving Velcade injection. Also c/o redness, pain, and heat to RLQ where she received her injection. Patient states she still has a dry cough that has been present for "several months". Reports she has pain with coughing spells. Beckey Rutter, NP made aware and will come to chairside to assess prior to releasing chemo orders.   Per Beckey Rutter, NP, okay to proceed with treatment.  Patient tolerated injection well. Denies any s/s at this time. Educated patient on s/s as to when to contact the clinic. Encouraged her to keep watch on the redness to RLQ to ensure it is not spreading and to monitor for fever. Patient verbalizes understanding and denies any further questions or concerns. Discharged home.

## 2020-03-19 NOTE — Progress Notes (Signed)
[place]  [phone] [phone]  ID: Heather Peterson OB: 24-Apr-1941  MR#: [id number]  HWT#:[id number]  [name], MD as PCP - General (Family Medicine) Lloyd Huger, MD as Consulting Physician (Oncology)  CHIEF COMPLAINT: Multiple myeloma.  INTERVAL HISTORY: Patient returns to clinic today for further evaluation and consideration of cycle 1, day 8 of Revlimid and Velcade.  Her husband had a fall and fractured his hip this past week.  She has increased diarrhea over the last 2 to 3 days, but otherwise tolerated her treatment well.  She continues to have persistent weakness and fatigue.  She has chronic shortness of breath and requires oxygen 24 hours/day. She has no neurologic complaints.  She denies any recent fevers.  She has a fair appetite.  She denies any chest pain, cough, or hemoptysis.  She denies any nausea, vomiting, or constipation.  She has no melena or hematochezia.  She has no urinary complaints.  Patient offers no further specific complaints today.  REVIEW OF SYSTEMS:   Review of Systems  Constitutional: Positive for malaise/fatigue. Negative for fever.  Respiratory: Positive for shortness of breath. Negative for cough and hemoptysis.   Cardiovascular: Positive for leg swelling. Negative for chest pain.  Gastrointestinal: Positive for diarrhea. Negative for abdominal pain, melena and nausea.  Genitourinary: Negative.  Negative for dysuria and hematuria.  Musculoskeletal: Negative.  Negative for back pain.  Skin: Negative.  Negative for rash.  Neurological: Positive for weakness. Negative for dizziness, focal weakness and headaches.  Psychiatric/Behavioral: Negative.  The patient is not nervous/anxious.     As per HPI. Otherwise, a complete review of systems is negative.  PAST MEDICAL HISTORY: Past Medical History:  Diagnosis Date  . Anxiety   . Chronic combined systolic and diastolic CHF  (congestive heart failure) ([place])   . Chronic kidney disease   . Coronary artery disease   . Depression   . Diabetes mellitus without complication ([place])   . Diabetes mellitus, type II ([place])   . Hypertension   . MI (myocardial infarction) ([place])    x 5  . Pacemaker   . Prolonged Q-T interval on ECG   . Thyroid disease     PAST SURGICAL HISTORY: Past Surgical History:  Procedure Laterality Date  . CENTRAL LINE INSERTION  03/11/2017   Procedure: CENTRAL LINE INSERTION;  Surgeon: Leonie Man, MD;  Location: [place] [facility];  Service: Cardiovascular;;  . CHOLECYSTECTOMY    . COLONOSCOPY WITH PROPOFOL N/A 09/01/2019   Procedure: COLONOSCOPY WITH PROPOFOL;  Surgeon: Toledo, Benay Pike, MD;  Location: [facility];  Service: Gastroenterology;  Laterality: N/A;  . CORONARY STENT INTERVENTION W/IMPELLA N/A 03/11/2017   Procedure: Coronary Stent Intervention w/Impella;  Surgeon: Leonie Man, MD;  Location: [place] [facility];  Service: Cardiovascular;  Laterality: N/A;  . ESOPHAGOGASTRODUODENOSCOPY (EGD) WITH PROPOFOL N/A 09/01/2019   Procedure: ESOPHAGOGASTRODUODENOSCOPY (EGD) WITH PROPOFOL;  Surgeon: Toledo, Benay Pike, MD;  Location: [facility];  Service: Gastroenterology;  Laterality: N/A;  . ESOPHAGOGASTRODUODENOSCOPY (EGD) WITH PROPOFOL N/A 09/08/2019   Procedure: ESOPHAGOGASTRODUODENOSCOPY (EGD) WITH PROPOFOL;  Surgeon: [name], MD;  Location: [facility];  Service: Gastroenterology;  Laterality: N/A;  . EYE SURGERY    . INTRAVASCULAR PRESSURE WIRE/FFR STUDY N/A 03/11/2017   Procedure: INTRAVASCULAR PRESSURE WIRE/FFR STUDY;  Surgeon: Leonie Man, MD;  Location: [place] [facility];  Service: Cardiovascular;  Laterality: N/A;  . INTRAVASCULAR ULTRASOUND/IVUS N/A 03/11/2017   Procedure: Intravascular Ultrasound/IVUS;  Surgeon: Leonie Man, MD;  Location: [place] [facility];  Service: Cardiovascular;  Laterality: N/A;  . LEFT HEART CATH AND CORONARY ANGIOGRAPHY  N/A 03/05/2017   Procedure: LEFT HEART CATH AND CORONARY ANGIOGRAPHY;  Surgeon: Isaias Cowman, MD;  Location: [place] [facility];  Service: Cardiovascular;  Laterality: N/A;  . PACEMAKER IMPLANT    . pacemaker/defibrillator Left     FAMILY HISTORY: Family History  Problem Relation Age of Onset  . Hypertension Father   . Heart attack Father   . Depression Sister   . Depression Brother   . Depression Brother     ADVANCED DIRECTIVES (Y/N):  N  HEALTH MAINTENANCE: Social History   Tobacco Use  . Smoking status: Current Every Day Smoker    Packs/day: 0.25    Types: E-cigarettes, Cigarettes  . Smokeless tobacco: Never Used  Vaping Use  . Vaping Use: Former  Substance Use Topics  . Alcohol use: Not Currently    Comment: occasionally  . Drug use: No     Colonoscopy:  PAP:  Bone density:  Lipid panel:  Allergies  Allergen Reactions  . Celebrex [Celecoxib] Anaphylaxis  . Glipizide Anaphylaxis  . Lisinopril Swelling    Lip and facial swelling  . Sulfa Antibiotics Other (See Comments) and Anaphylaxis    Reaction: unknown  . Levaquin [Levofloxacin In D5w] Other (See Comments)    Heart arrhthymias  . Levofloxacin Other (See Comments)    ICD fired  . Metformin Other (See Comments)    Gi tolerance   . Penicillins Rash and Other (See Comments)    Has patient had a PCN reaction causing immediate rash, facial/tongue/throat swelling, SOB or lightheadedness with hypotension: Unknown Has patient had a PCN reaction causing severe rash involving mucus membranes or skin necrosis: No Has patient had a PCN reaction that required hospitalization: No Has patient had a PCN reaction occurring within the last 10 years: No If all of the above answers are "NO", then may proceed with Cephalosporin use.     Current Outpatient Medications  Medication Sig Dispense Refill  . allopurinol (ZYLOPRIM) 100 MG tablet Take 50 mg by mouth daily.    Marland Kitchen aspirin EC 81 MG tablet Take 81 mg by  mouth at bedtime.     . chlorpheniramine-HYDROcodone (TUSSIONEX) 10-8 MG/5ML SUER Take 5 mLs by mouth every 12 (twelve) hours as needed for cough. 140 mL 0  . dexamethasone (DECADRON) 4 MG tablet Take 5 tablets (20 mg total) by mouth daily. Take the day after Velcade on days 2,5,9,12. Take with breakfast 40 tablet 3  . fexofenadine (ALLEGRA) 180 MG tablet Take 180 mg by mouth daily.    Marland Kitchen FLUoxetine (PROZAC) 10 MG capsule Take 10 mg by mouth daily.    Marland Kitchen lenalidomide (REVLIMID) 10 MG capsule Take 1 capsule (10 mg total) by mouth daily. Take 14 days on, 7 days off, repeat every 21 days 14 capsule 0  . levothyroxine (SYNTHROID) 50 MCG tablet Take 50 mcg by mouth See admin instructions. Take 1 tablet (71mg) by mouth every Tuesday, Thursday, Saturday and Sunday before breakfast    . levothyroxine (SYNTHROID, LEVOTHROID) 75 MCG tablet Take 75 mcg by mouth See admin instructions. Take 1 tablet (763m) by mouth every Monday, Wednesday and Friday morning before breakfast    . magnesium oxide (MAG-OX) 400 MG tablet Take 200 mg by mouth daily.    . Marland Kitchenexiletine (MEXITIL) 200 MG capsule Take 1 capsule (200 mg total) by mouth every 12 (twelve) hours.  60 capsule 1  . midodrine (PROAMATINE) 5 MG tablet Take 5 mg by mouth daily as needed.     . ondansetron (ZOFRAN) 8 MG tablet Take 1 tablet (8 mg total) by mouth 2 (two) times daily as needed (Nausea or vomiting). 60 tablet 2  . OXYGEN Inhale 2 L into the lungs continuous.    . pantoprazole (PROTONIX) 40 MG tablet Take 40 mg by mouth daily.     . Prenatal Vit-Fe Fumarate-FA (PRENATAL MULTIVITAMIN) TABS tablet Take 1 tablet by mouth daily.    . prochlorperazine (COMPAZINE) 10 MG tablet Take 1 tablet (10 mg total) by mouth every 6 (six) hours as needed (Nausea or vomiting). 60 tablet 2  . simvastatin (ZOCOR) 40 MG tablet Take 20 mg by mouth at bedtime.   2  . sucralfate (CARAFATE) 1 g tablet Take 1 tablet by mouth 3 (three) times daily before meals.    . torsemide  (DEMADEX) 20 MG tablet Take 2 tablets (40 mg total) by mouth daily. Future refills need to come from kidney doctor 60 tablet 1  . traZODone (DESYREL) 50 MG tablet Take 50 mg by mouth at bedtime.    . diphenoxylate-atropine (LOMOTIL) 2.5-0.025 MG tablet Take 1 tablet by mouth 4 (four) times daily as needed for diarrhea or loose stools. 30 tablet 1   No current facility-administered medications for this visit.    OBJECTIVE: Vitals:   03/20/20 1118  BP: 119/70  Pulse: 92  Resp: 20  Temp: (!) 97 F (36.1 C)  SpO2: 99%     Body mass index is 29.95 kg/m.    ECOG FS:2 - Symptomatic, <50% confined to bed  General: Well-developed, well-nourished, no acute distress.  Sitting in a wheelchair. Eyes: Pink conjunctiva, anicteric sclera. HEENT: Normocephalic, moist mucous membranes. Lungs: No audible wheezing or coughing. Heart: Regular rate and rhythm. Abdomen: Soft, nontender, no obvious distention. Musculoskeletal: No edema, cyanosis, or clubbing. Neuro: Alert, answering all questions appropriately. Cranial nerves grossly intact. Skin: No rashes or petechiae noted. Psych: Normal affect.  LAB RESULTS:  Lab Results  Component Value Date   NA 137 03/20/2020   K 3.9 03/20/2020   CL 94 (L) 03/20/2020   CO2 29 03/20/2020   GLUCOSE 148 (H) 03/20/2020   BUN 62 (H) 03/20/2020   CREATININE 1.63 (H) 03/20/2020   CALCIUM 9.0 03/20/2020   PROT 7.3 03/20/2020   ALBUMIN 4.1 03/20/2020   AST 24 03/20/2020   ALT 24 03/20/2020   ALKPHOS 61 03/20/2020   BILITOT 0.7 03/20/2020   GFRNONAA 32 (L) 03/20/2020   GFRAA 30 (L) 09/09/2019    Lab Results  Component Value Date   WBC 7.0 03/20/2020   NEUTROABS 5.5 03/20/2020   HGB 9.0 (L) 03/20/2020   HCT 28.6 (L) 03/20/2020   MCV 95.7 03/20/2020   PLT 162 03/20/2020   Lab Results  Component Value Date   IRON 36 03/20/2020   TIBC 463 (H) 03/20/2020   IRONPCTSAT 8 (L) 03/20/2020   Lab Results  Component Value Date   FERRITIN 103 03/20/2020       STUDIES: DG Chest 2 View  Result Date: 03/15/2020 CLINICAL DATA:  79 year old female with cough for 2-3 months and drainage. Multiple myeloma. EXAM: CHEST - 2 VIEW COMPARISON:  Chest radiographs 08/27/2019 and earlier. FINDINGS: Chronic cardiomegaly, left chest AICD. Improved lung volumes, and resolved small pleural effusions since April. No pneumothorax. Stable pulmonary vascularity, no overt edema. No confluent pulmonary opacity. Calcified aortic atherosclerosis. Stable visualized osseous structures.  Negative visible bowel gas pattern. IMPRESSION: Cardiomegaly. Resolved pleural effusions since April. No acute cardiopulmonary abnormality. Electronically Signed   By: Genevie Ann M.D.   On: 03/15/2020 12:14   US Abdomen Complete  Result Date: 03/11/2020 CLINICAL DATA:  Cirrhosis EXAM: ABDOMEN ULTRASOUND COMPLETE COMPARISON:  CT 07/16/2019 FINDINGS: Gallbladder: Prior cholecystectomy Common bile duct: Diameter: Normal caliber, 3 mm Liver: Diffusely increased echotexture and nodular contours. No focal hepatic abnormality. Portal vein is patent on color Doppler imaging with normal direction of blood flow towards the liver. IVC: No abnormality visualized. Pancreas: Visualized portion unremarkable. Spleen: Size and appearance within normal limits. Right Kidney: Length: 8.4 cm. Echogenicity within normal limits. No mass or hydronephrosis visualized. Left Kidney: Length: 8.8 cm. Echogenicity within normal limits. No mass or hydronephrosis visualized. Abdominal aorta: No aneurysm visualized. Other findings: Moderate ascites. IMPRESSION: Changes of hepatic steatosis and cirrhosis. Moderate ascites. No focal hepatic abnormality. Electronically Signed   By: Rolm Baptise M.D.   On: 03/11/2020 20:44   DG Bone Survey Met  Result Date: 03/04/2020 CLINICAL DATA:  Multiple myeloma EXAM: METASTATIC BONE SURVEY COMPARISON:  None. FINDINGS: Metastatic bone survey was performed. Cardiac shadow is enlarged. Defibrillator  is noted. The lungs are clear. No definitive rib abnormality is noted. Cervical spine demonstrates multilevel degenerative change. No lytic or sclerotic lesions are seen. The skull demonstrates a single lucency in the parietal bone consistent with the given clinical history. Upper extremities show no significant lytic lesions. Thoracic spine demonstrates vertebral body height to be well maintained. No paraspinal mass or pedicle abnormality is noted. Degenerative changes of lumbar spine are seen. No lytic lesion is noted. No compression deformities are noted. Scattered lytic lesions are noted within the pubic rami as well as the proximal femurs bilaterally. Distal lower extremities show no definitive lytic or sclerotic lesions. IMPRESSION: Scattered lucencies are noted as described consistent with the given clinical history of myeloma. Electronically Signed   By: Inez Catalina M.D.   On: 03/04/2020 19:59   CT BONE MARROW BIOPSY & ASPIRATION  Result Date: 02/21/2020 INDICATION: 79 year old female with history of anemia. EXAM: CT-GUIDED BONE MARROW BIOPSY AND ASPIRATION MEDICATIONS: None ANESTHESIA/SEDATION: Fentanyl 50 mcg IV; Versed 1 mg IV Sedation Time: 11 minutes; The patient was continuously monitored during the procedure by the interventional radiology nurse under my direct supervision. COMPLICATIONS: None immediate. PROCEDURE: Informed consent was obtained from the patient following an explanation of the procedure, risks, benefits and alternatives. The patient understands, agrees and consents for the procedure. All questions were addressed. A time out was performed prior to the initiation of the procedure. The patient was positioned prone and non-contrast localization CT was performed of the pelvis to demonstrate the iliac marrow spaces. The operative site was prepped and draped in the usual sterile fashion. Under sterile conditions and local anesthesia, a 22 gauge spinal needle was utilized for procedural  planning. Next, an 11 gauge coaxial bone biopsy needle was advanced into the right iliac marrow space. Needle position was confirmed with CT imaging. Initially, a bone marrow aspiration was performed. Next, a bone marrow biopsy was obtained with the 11 gauge outer bone marrow device. The 11 gauge coaxial bone biopsy needle was re-advanced into a slightly different location within the left iliac marrow space, positioning was confirmed with CT imaging and an additional bone marrow biopsy was obtained. Samples were prepared with the cytotechnologist and deemed adequate. The needle was removed and superficial hemostasis was obtained with manual compression. A dressing was applied. The  patient tolerated the procedure well without immediate post procedural complication. IMPRESSION: Successful CT guided right iliac bone marrow aspiration and core biopsy. Ruthann Cancer, MD [facility] Electronically Signed   By: Ruthann Cancer MD   On: 02/21/2020 09:59    ASSESSMENT: Multiple myeloma.  PLAN:    1.  Multiple myeloma: Bone marrow biopsy confirmed diagnosis with plasma cells up to 90% of biopsy.  Cytogenetics are reported as normal.  Metastatic bone survey from March 03, 2020 reviewed independently and report as above with multiple skeletal lucencies consistent with myeloma.  M spike is only mildly elevated at 1.2, but she has a significantly elevated IgM level of 2773.  Kappa and lambda free light chains are within normal limits.  Patient noted to have endorgan damage of renal insufficiency as well as persistent anemia.  She will benefit from Velcade on days 1, 4, 8, and 11 along with 10 mg Revlimid on days 1 through 14.  Revlimid has been dose reduced secondary to renal insufficiency.  Patient will also receive 20 mg dexamethasone 1 day after each Velcade injection.  Proceed with cycle 1, day 8 of treatment today.  Return to clinic on Thursday for Velcade  only.  Patient will then return to clinic in 3 weeks instead of 2 weeks because of the Thanksgiving holiday for consideration of cycle 2, day 1.  2.  Anemia: Hemoglobin improved to 9.0.  Likely multifactorial with chronic renal insufficiency, underlying myeloma, as well as history of recent GI bleed.  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 She does not require transfusion or IV iron at this time, but this may be necessary in the future.   3.  Chronic renal insufficiency: Creatinine improved to 1.63.  Dose reduced Revlimid as above.  Velcade does not need to be dosed reduced and renal dysfunction.  Monitor.   4.  Ascites: Continue follow-up with heart failure clinic as scheduled. 5.  Angiodysplastic lesions: Continue follow-up with GI as scheduled. 6.  Diarrhea: Patient was given a prescription for Lomotil today.   Patient expressed understanding and was in agreement with this plan. She also understands that She can call clinic at any time with any questions, concerns, or complaints.    Lloyd Huger, MD   03/20/2020 1:15 PM

## 2020-03-20 ENCOUNTER — Other Ambulatory Visit: Payer: Self-pay | Admitting: *Deleted

## 2020-03-20 ENCOUNTER — Inpatient Hospital Stay: Payer: Medicare Other | Admitting: Oncology

## 2020-03-20 ENCOUNTER — Inpatient Hospital Stay: Payer: Medicare Other

## 2020-03-20 ENCOUNTER — Other Ambulatory Visit: Payer: Self-pay

## 2020-03-20 ENCOUNTER — Other Ambulatory Visit: Payer: Self-pay | Admitting: Oncology

## 2020-03-20 ENCOUNTER — Inpatient Hospital Stay (HOSPITAL_BASED_OUTPATIENT_CLINIC_OR_DEPARTMENT_OTHER): Payer: Medicare Other | Admitting: Oncology

## 2020-03-20 ENCOUNTER — Encounter: Payer: Self-pay | Admitting: Oncology

## 2020-03-20 VITALS — BP 119/70 | HR 92 | Temp 97.0°F | Resp 20 | Wt 174.5 lb

## 2020-03-20 DIAGNOSIS — C9000 Multiple myeloma not having achieved remission: Secondary | ICD-10-CM

## 2020-03-20 DIAGNOSIS — D649 Anemia, unspecified: Secondary | ICD-10-CM

## 2020-03-20 DIAGNOSIS — Z5111 Encounter for antineoplastic chemotherapy: Secondary | ICD-10-CM | POA: Diagnosis not present

## 2020-03-20 LAB — CBC WITH DIFFERENTIAL/PLATELET
Abs Immature Granulocytes: 0.1 10*3/uL — ABNORMAL HIGH (ref 0.00–0.07)
Basophils Absolute: 0 10*3/uL (ref 0.0–0.1)
Basophils Relative: 0 %
Eosinophils Absolute: 0 10*3/uL (ref 0.0–0.5)
Eosinophils Relative: 0 %
HCT: 28.6 % — ABNORMAL LOW (ref 36.0–46.0)
Hemoglobin: 9 g/dL — ABNORMAL LOW (ref 12.0–15.0)
Immature Granulocytes: 1 %
Lymphocytes Relative: 6 %
Lymphs Abs: 0.4 10*3/uL — ABNORMAL LOW (ref 0.7–4.0)
MCH: 30.1 pg (ref 26.0–34.0)
MCHC: 31.5 g/dL (ref 30.0–36.0)
MCV: 95.7 fL (ref 80.0–100.0)
Monocytes Absolute: 1 10*3/uL (ref 0.1–1.0)
Monocytes Relative: 14 %
Neutro Abs: 5.5 10*3/uL (ref 1.7–7.7)
Neutrophils Relative %: 79 %
Platelets: 162 10*3/uL (ref 150–400)
RBC: 2.99 MIL/uL — ABNORMAL LOW (ref 3.87–5.11)
RDW: 18.5 % — ABNORMAL HIGH (ref 11.5–15.5)
WBC: 7 10*3/uL (ref 4.0–10.5)
nRBC: 0 % (ref 0.0–0.2)

## 2020-03-20 LAB — COMPREHENSIVE METABOLIC PANEL
ALT: 24 U/L (ref 0–44)
AST: 24 U/L (ref 15–41)
Albumin: 4.1 g/dL (ref 3.5–5.0)
Alkaline Phosphatase: 61 U/L (ref 38–126)
Anion gap: 14 (ref 5–15)
BUN: 62 mg/dL — ABNORMAL HIGH (ref 8–23)
CO2: 29 mmol/L (ref 22–32)
Calcium: 9 mg/dL (ref 8.9–10.3)
Chloride: 94 mmol/L — ABNORMAL LOW (ref 98–111)
Creatinine, Ser: 1.63 mg/dL — ABNORMAL HIGH (ref 0.44–1.00)
GFR, Estimated: 32 mL/min — ABNORMAL LOW (ref >60)
Glucose, Bld: 148 mg/dL — ABNORMAL HIGH (ref 70–99)
Potassium: 3.9 mmol/L (ref 3.5–5.1)
Sodium: 137 mmol/L (ref 135–145)
Total Bilirubin: 0.7 mg/dL (ref 0.3–1.2)
Total Protein: 7.3 g/dL (ref 6.5–8.1)

## 2020-03-20 LAB — FERRITIN: Ferritin: 103 ng/mL (ref 11–307)

## 2020-03-20 LAB — IRON AND TIBC
Iron: 36 ug/dL (ref 28–170)
Saturation Ratios: 8 % — ABNORMAL LOW (ref 10.4–31.8)
TIBC: 463 ug/dL — ABNORMAL HIGH (ref 250–450)
UIBC: 427 ug/dL

## 2020-03-20 MED ORDER — DEXAMETHASONE 4 MG PO TABS
20.0000 mg | ORAL_TABLET | Freq: Once | ORAL | Status: AC
  Administered 2020-03-20: 20 mg via ORAL
  Filled 2020-03-20: qty 5

## 2020-03-20 MED ORDER — DIPHENOXYLATE-ATROPINE 2.5-0.025 MG PO TABS: 1.0000 | ORAL_TABLET | Freq: Four times a day (QID) | ORAL | 1 refills | Status: DC | PRN

## 2020-03-20 MED ORDER — BORTEZOMIB CHEMO SQ INJECTION 3.5 MG (2.5MG/ML)
1.3000 mg/m2 | Freq: Once | INTRAMUSCULAR | Status: AC
  Administered 2020-03-20: 2.5 mg via SUBCUTANEOUS
  Filled 2020-03-20: qty 1

## 2020-03-20 MED ORDER — LENALIDOMIDE 10 MG PO CAPS: 10.0000 mg | ORAL_CAPSULE | Freq: Every day | ORAL | 0 refills | Status: DC

## 2020-03-20 MED ORDER — DIPHENOXYLATE-ATROPINE 2.5-0.025 MG PO TABS: 1.0000 | ORAL_TABLET | Freq: Four times a day (QID) | ORAL | 0 refills | Status: DC | PRN

## 2020-03-20 NOTE — Progress Notes (Signed)
Patient states she is having fatigue/paleness, shakiness, irritability, bloody diarrhea.

## 2020-03-21 ENCOUNTER — Ambulatory Visit: Payer: Medicare Other | Admitting: Oncology

## 2020-03-21 ENCOUNTER — Other Ambulatory Visit: Payer: Medicare Other

## 2020-03-21 ENCOUNTER — Ambulatory Visit: Payer: Medicare Other

## 2020-03-23 ENCOUNTER — Inpatient Hospital Stay: Payer: Medicare Other

## 2020-03-23 ENCOUNTER — Other Ambulatory Visit: Payer: Self-pay

## 2020-03-23 VITALS — BP 120/74 | HR 95 | Temp 98.2°F | Wt 170.2 lb

## 2020-03-23 DIAGNOSIS — Z5111 Encounter for antineoplastic chemotherapy: Secondary | ICD-10-CM | POA: Diagnosis not present

## 2020-03-23 DIAGNOSIS — C9000 Multiple myeloma not having achieved remission: Secondary | ICD-10-CM

## 2020-03-23 MED ORDER — DEXAMETHASONE 4 MG PO TABS
20.0000 mg | ORAL_TABLET | Freq: Once | ORAL | Status: AC
  Administered 2020-03-23: 20 mg via ORAL
  Filled 2020-03-23: qty 5

## 2020-03-23 MED ORDER — BORTEZOMIB CHEMO SQ INJECTION 3.5 MG (2.5MG/ML)
1.3000 mg/m2 | Freq: Once | INTRAMUSCULAR | Status: AC
  Administered 2020-03-23: 2.5 mg via SUBCUTANEOUS
  Filled 2020-03-23: qty 1

## 2020-03-23 NOTE — Progress Notes (Signed)
Pt here for Velcade. Has multiple complaints of feeling " foggy brained since Monday, not thinking clearly and jittery ". States she feels related to decadron she takes with her shot.  States she also has bluish / black toe on right foot x 3-4 days .  Pt states has not had BM in 7 days. States has taken Miralax takes few days with no relief. Md / NP aware. Scheduled appt for [facility] tomorrow @ 1015.  Pt aware. Ok to treat today.  Advised per NP for pt to take Milk of magnesia or mag citrate tonight. Pt verbalized understanding. Return tomorrow as scheduled.

## 2020-03-24 ENCOUNTER — Encounter: Payer: Self-pay | Admitting: Nurse Practitioner

## 2020-03-24 ENCOUNTER — Inpatient Hospital Stay (HOSPITAL_BASED_OUTPATIENT_CLINIC_OR_DEPARTMENT_OTHER): Payer: Medicare Other | Admitting: Nurse Practitioner

## 2020-03-24 VITALS — BP 106/60 | HR 91 | Temp 98.4°F | Resp 18 | Wt 171.5 lb

## 2020-03-24 DIAGNOSIS — Z5111 Encounter for antineoplastic chemotherapy: Secondary | ICD-10-CM | POA: Diagnosis not present

## 2020-03-24 DIAGNOSIS — K5903 Drug induced constipation: Secondary | ICD-10-CM | POA: Diagnosis not present

## 2020-03-24 DIAGNOSIS — S90129A Contusion of unspecified lesser toe(s) without damage to nail, initial encounter: Secondary | ICD-10-CM | POA: Diagnosis not present

## 2020-03-24 DIAGNOSIS — T402X5A Adverse effect of other opioids, initial encounter: Secondary | ICD-10-CM | POA: Diagnosis not present

## 2020-03-24 DIAGNOSIS — T887XXA Unspecified adverse effect of drug or medicament, initial encounter: Secondary | ICD-10-CM | POA: Diagnosis not present

## 2020-03-24 NOTE — Progress Notes (Signed)
Pt here for follow up with symptom management. Pt took Milk of magnesia yesterday and helped some. Pt reports being jittery, agitated and in a "fog all the time." Pt has 1 toe turning black.

## 2020-03-24 NOTE — Progress Notes (Signed)
Symptom Management [place]  [phone] [phone]  [name], MD as PCP - General (Family Medicine) Lloyd Huger, MD as Consulting Physician (Oncology)   Name of the patient: Heather Peterson  [id number]  1940-09-03   Date of visit: 03/24/20  Diagnosis- Multiple Myeloma  Chief complaint/ Reason for visit- Jittery sensation, discolored toe  Heme/Onc history:  Oncology History  Multiple myeloma ([place])  03/01/2020 Initial Diagnosis   Multiple myeloma ([place])   03/13/2020 -  Chemotherapy   The patient had dexamethasone (DECADRON) tablet 20 mg, 20 mg, Oral,  Once, 1 of 8 cycles Administration: 20 mg (03/13/2020), 20 mg (03/16/2020), 20 mg (03/20/2020), 20 mg (03/23/2020) dexamethasone (DECADRON) 4 MG tablet, 20 mg, Oral, Daily, 1 of 1 cycle, Start date: 03/01/2020, End date: -- lenalidomide (REVLIMID) 10 MG capsule, 10 mg, Oral, Daily, 1 of 1 cycle, Start date: 03/20/2020, End date: -- bortezomib SQ (VELCADE) chemo injection (2.86m/mL concentration) 2.5 mg, 1.3 mg/m2 = 2.5 mg, Subcutaneous,  Once, 1 of 8 cycles Administration: 2.5 mg (03/13/2020), 2.5 mg (03/16/2020), 2.5 mg (03/20/2020), 2.5 mg (03/23/2020)  for chemotherapy treatment.      Interval history- Heather Peterson 79year old female, diagnosed with multiple myeloma currently receiving Revlimid and Velcade presents to symptom management status post cycle 1 day 8 of treatment.  Also taking Decadron.  She says that since starting treatment she has felt jittery and anxious.  Feels like she is in a brain fog.  Additionally notes she has a toe that she feels is turned black.  Complains of persistent weakness and fatigue which is chronic and unchanged.  Additionally, she has chronic shortness of breath and requires oxygen at all times.  She has chronic leg swelling and chronic diarrhea.  ECOG FS:2 - Symptomatic, <50% confined to bed  Review of  systems- Review of Systems  Constitutional: Positive for malaise/fatigue. Negative for chills, fever and weight loss.  HENT: Negative for hearing loss, nosebleeds, sore throat and tinnitus.   Eyes: Negative for blurred vision and double vision.  Respiratory: Positive for shortness of breath. Negative for cough, hemoptysis and wheezing.   Cardiovascular: Positive for leg swelling. Negative for chest pain and palpitations.  Gastrointestinal: Positive for heartburn and nausea. Negative for abdominal pain, blood in stool, constipation, diarrhea, melena and vomiting.  Genitourinary: Negative for dysuria and urgency.  Musculoskeletal: Negative for back pain, falls, joint pain and myalgias.       Discolored 'black' toe  Skin: Negative for itching and rash.  Neurological: Negative for dizziness, tingling, sensory change, loss of consciousness, weakness and headaches.  Endo/Heme/Allergies: Negative for environmental allergies. Does not bruise/bleed easily.  Psychiatric/Behavioral: Negative for depression. The patient is nervous/anxious. The patient does not have insomnia.      Allergies  Allergen Reactions  . Celebrex [Celecoxib] Anaphylaxis  . Glipizide Anaphylaxis  . Lisinopril Swelling    Lip and facial swelling  . Sulfa Antibiotics Other (See Comments) and Anaphylaxis    Reaction: unknown  . Levaquin [Levofloxacin In D5w] Other (See Comments)    Heart arrhthymias  . Levofloxacin Other (See Comments)    ICD fired  . Metformin Other (See Comments)    Gi tolerance   . Penicillins Rash and Other (See Comments)    Has patient had a PCN reaction causing immediate rash, facial/tongue/throat swelling, SOB or lightheadedness with hypotension: Unknown Has patient had a PCN reaction causing severe rash involving mucus membranes or skin necrosis: No  Has patient had a PCN reaction that required hospitalization: No Has patient had a PCN reaction occurring within the last 10 years: No If all of the  above answers are "NO", then may proceed with Cephalosporin use.     Past Medical History:  Diagnosis Date  . Anxiety   . Chronic combined systolic and diastolic CHF (congestive heart failure) ([employer])   . Chronic kidney disease   . Coronary artery disease   . Depression   . Diabetes mellitus without complication ([place])   . Diabetes mellitus, type II ([place])   . Hypertension   . MI (myocardial infarction) ([place])    x 5  . Pacemaker   . Prolonged Q-T interval on ECG   . Thyroid disease     Past Surgical History:  Procedure Laterality Date  . CENTRAL LINE INSERTION  03/11/2017   Procedure: CENTRAL LINE INSERTION;  Surgeon: Leonie Man, MD;  Location: [place];  Service: Cardiovascular;;  . CHOLECYSTECTOMY    . COLONOSCOPY WITH PROPOFOL N/A 09/01/2019   Procedure: COLONOSCOPY WITH PROPOFOL;  Surgeon: Toledo, Benay Pike, MD;  Location: [facility];  Service: Gastroenterology;  Laterality: N/A;  . CORONARY STENT INTERVENTION W/IMPELLA N/A 03/11/2017   Procedure: Coronary Stent Intervention w/Impella;  Surgeon: Leonie Man, MD;  Location: [place];  Service: Cardiovascular;  Laterality: N/A;  . ESOPHAGOGASTRODUODENOSCOPY (EGD) WITH PROPOFOL N/A 09/01/2019   Procedure: ESOPHAGOGASTRODUODENOSCOPY (EGD) WITH PROPOFOL;  Surgeon: Toledo, Benay Pike, MD;  Location: [facility];  Service: Gastroenterology;  Laterality: N/A;  . ESOPHAGOGASTRODUODENOSCOPY (EGD) WITH PROPOFOL N/A 09/08/2019   Procedure: ESOPHAGOGASTRODUODENOSCOPY (EGD) WITH PROPOFOL;  Surgeon: [name], MD;  Location: [facility];  Service: Gastroenterology;  Laterality: N/A;  . EYE SURGERY    . INTRAVASCULAR PRESSURE WIRE/FFR STUDY N/A 03/11/2017   Procedure: INTRAVASCULAR PRESSURE WIRE/FFR STUDY;  Surgeon: Leonie Man, MD;  Location: [place] [facility];  Service: Cardiovascular;  Laterality: N/A;  . INTRAVASCULAR ULTRASOUND/IVUS N/A 03/11/2017   Procedure: Intravascular Ultrasound/IVUS;   Surgeon: Leonie Man, MD;  Location: [place] [facility];  Service: Cardiovascular;  Laterality: N/A;  . LEFT HEART CATH AND CORONARY ANGIOGRAPHY N/A 03/05/2017   Procedure: LEFT HEART CATH AND CORONARY ANGIOGRAPHY;  Surgeon: Isaias Cowman, MD;  Location: [place] [facility];  Service: Cardiovascular;  Laterality: N/A;  . PACEMAKER IMPLANT    . pacemaker/defibrillator Left     Social History   Socioeconomic History  . Marital status: Married    Spouse name: rodney  . Number of children: 2  . Years of education: Not on file  . Highest education level: High school graduate  Occupational History  . Not on file  Tobacco Use  . Smoking status: Current Every Day Smoker    Packs/day: 0.25    Types: E-cigarettes, Cigarettes  . Smokeless tobacco: Never Used  Vaping Use  . Vaping Use: Former  Substance and Sexual Activity  . Alcohol use: Not Currently    Comment: occasionally  . Drug use: No  . Sexual activity: Not Currently  Other Topics Concern  . Not on file  Social History Narrative  . Not on file   Social Determinants of Health   Financial Resource Strain:   . Difficulty of Paying Living Expenses: Not on file  Food Insecurity:   . Worried About Charity fundraiser in the Last Year: Not on file  . Ran Out of Food in the Last Year: Not on file  Transportation Needs:   .  Lack of Transportation (Medical): Not on file  . Lack of Transportation (Non-Medical): Not on file  Physical Activity:   . Days of Exercise per Week: Not on file  . Minutes of Exercise per Session: Not on file  Stress:   . Feeling of Stress : Not on file  Social Connections:   . Frequency of Communication with Friends and Family: Not on file  . Frequency of Social Gatherings with Friends and Family: Not on file  . Attends Religious Services: Not on file  . Active Member of Clubs or Organizations: Not on file  . Attends Archivist Meetings: Not on file  . Marital Status: Not on file   Intimate Partner Violence:   . Fear of Current or Ex-Partner: Not on file  . Emotionally Abused: Not on file  . Physically Abused: Not on file  . Sexually Abused: Not on file    Family History  Problem Relation Age of Onset  . Hypertension Father   . Heart attack Father   . Depression Sister   . Depression Brother   . Depression Brother      Current Outpatient Medications:  .  allopurinol (ZYLOPRIM) 100 MG tablet, Take 50 mg by mouth daily., Disp: , Rfl:  .  aspirin EC 81 MG tablet, Take 81 mg by mouth at bedtime. , Disp: , Rfl:  .  chlorpheniramine-HYDROcodone (TUSSIONEX) 10-8 MG/5ML SUER, Take 5 mLs by mouth every 12 (twelve) hours as needed for cough., Disp: 140 mL, Rfl: 0 .  colchicine 0.6 MG tablet, Take 0.6 mg by mouth 2 (two) times daily., Disp: , Rfl:  .  dexamethasone (DECADRON) 4 MG tablet, Take 5 tablets (20 mg total) by mouth daily. Take the day after Velcade on days 2,5,9,12. Take with breakfast, Disp: 40 tablet, Rfl: 3 .  diphenoxylate-atropine (LOMOTIL) 2.5-0.025 MG tablet, Take 1 tablet by mouth 4 (four) times daily as needed for diarrhea or loose stools., Disp: 30 tablet, Rfl: 1 .  fexofenadine (ALLEGRA) 180 MG tablet, Take 180 mg by mouth daily., Disp: , Rfl:  .  FLUoxetine (PROZAC) 10 MG capsule, Take 10 mg by mouth daily., Disp: , Rfl:  .  folic acid (FOLVITE) 1 MG tablet, folic acid 1 mg tablet, Disp: , Rfl:  .  furosemide (LASIX) 20 MG tablet, furosemide 20 mg tablet  TAKE 1 TABLET BY MOUTH EVERY DAY AS NEEDED FOR FLUID OR EDEMA. FOR LEG SWELLING IF YOUR WEIGHT GOES OVER 5 LBS FROM BASELINE, Disp: , Rfl:  .  lenalidomide (REVLIMID) 10 MG capsule, Take 1 capsule (10 mg total) by mouth daily. Take 14 days on, 7 days off, repeat every 21 days, Disp: 14 capsule, Rfl: 0 .  levothyroxine (SYNTHROID) 50 MCG tablet, Take 50 mcg by mouth See admin instructions. Take 1 tablet (68mg) by mouth every Tuesday, Thursday, Saturday and Sunday before breakfast, Disp: , Rfl:  .   levothyroxine (SYNTHROID, LEVOTHROID) 75 MCG tablet, Take 75 mcg by mouth See admin instructions. Take 1 tablet (764m) by mouth every Monday, Wednesday and Friday morning before breakfast, Disp: , Rfl:  .  magnesium oxide (MAG-OX) 400 MG tablet, Take 200 mg by mouth daily., Disp: , Rfl:  .  mexiletine (MEXITIL) 200 MG capsule, Take 1 capsule (200 mg total) by mouth every 12 (twelve) hours., Disp: 60 capsule, Rfl: 1 .  midodrine (PROAMATINE) 5 MG tablet, Take 5 mg by mouth daily as needed. , Disp: , Rfl:  .  ondansetron (ZOFRAN) 8 MG tablet,  Take 1 tablet (8 mg total) by mouth 2 (two) times daily as needed (Nausea or vomiting)., Disp: 60 tablet, Rfl: 2 .  OXYGEN, Inhale 2 L into the lungs continuous., Disp: , Rfl:  .  pantoprazole (PROTONIX) 40 MG tablet, Take 40 mg by mouth daily. , Disp: , Rfl:  .  potassium chloride SA (KLOR-CON) 20 MEQ tablet, potassium chloride ER 20 mEq tablet,extended release(part/cryst), Disp: , Rfl:  .  predniSONE (DELTASONE) 20 MG tablet, Take 20 mg by mouth 2 (two) times daily., Disp: , Rfl:  .  Prenatal Vit-Fe Fumarate-FA (PRENATAL MULTIVITAMIN) TABS tablet, Take 1 tablet by mouth daily., Disp: , Rfl:  .  prochlorperazine (COMPAZINE) 10 MG tablet, Take 1 tablet (10 mg total) by mouth every 6 (six) hours as needed (Nausea or vomiting)., Disp: 60 tablet, Rfl: 2 .  simvastatin (ZOCOR) 40 MG tablet, Take 20 mg by mouth at bedtime. , Disp: , Rfl: 2 .  spironolactone (ALDACTONE) 25 MG tablet, spironolactone 25 mg tablet, Disp: , Rfl:  .  sucralfate (CARAFATE) 1 g tablet, Take 1 tablet by mouth 3 (three) times daily before meals., Disp: , Rfl:  .  torsemide (DEMADEX) 20 MG tablet, Take 2 tablets (40 mg total) by mouth daily. Future refills need to come from kidney doctor, Disp: 60 tablet, Rfl: 1 .  traZODone (DESYREL) 50 MG tablet, Take 50 mg by mouth at bedtime., Disp: , Rfl:   Physical exam:  Vitals:   03/24/20 1038  BP: 106/60  Pulse: 91  Resp: 18  Temp: 98.4 F (36.9  C)  SpO2: 99%  Weight: 171 lb 8 oz (77.8 kg)   Physical Exam Constitutional:      General: She is not in acute distress.    Appearance: She is well-developed. She is not ill-appearing.  HENT:     Head: Atraumatic.     Mouth/Throat:     Pharynx: No oropharyngeal exudate.  Eyes:     General: No scleral icterus.    Conjunctiva/sclera: Conjunctivae normal.  Cardiovascular:     Rate and Rhythm: Normal rate and regular rhythm.  Pulmonary:     Effort: Pulmonary effort is normal.     Breath sounds: Normal breath sounds.  Abdominal:     General: Bowel sounds are normal. There is no distension.     Palpations: Abdomen is soft.     Tenderness: There is no abdominal tenderness. There is no guarding or rebound.  Musculoskeletal:        General: Normal range of motion.     Cervical back: Normal range of motion and neck supple.     Right foot: Normal capillary refill. Normal pulse.     Left foot: Normal capillary refill. Normal pulse.     Comments: Bruising of the right 2nd toe. No deformity, pain to palpation. Other normals normal appearing.   Skin:    General: Skin is warm and dry.  Neurological:     Mental Status: She is alert and oriented to person, place, and time.     Motor: No weakness.  Psychiatric:        Mood and Affect: Mood normal.        Behavior: Behavior normal.      CMP Latest Ref Rng & Units 03/20/2020  Glucose 70 - 99 mg/dL 148(H)  BUN 8 - 23 mg/dL 62(H)  Creatinine 0.44 - 1.00 mg/dL 1.63(H)  Sodium 135 - 145 mmol/L 137  Potassium 3.5 - 5.1 mmol/L 3.9  Chloride 98 -  111 mmol/L 94(L)  CO2 22 - 32 mmol/L 29  Calcium 8.9 - 10.3 mg/dL 9.0  Total Protein 6.5 - 8.1 g/dL 7.3  Total Bilirubin 0.3 - 1.2 mg/dL 0.7  Alkaline Phos 38 - 126 U/L 61  AST 15 - 41 U/L 24  ALT 0 - 44 U/L 24   CBC Latest Ref Rng & Units 03/20/2020  WBC 4.0 - 10.5 K/uL 7.0  Hemoglobin 12.0 - 15.0 g/dL 9.0(L)  Hematocrit 36 - 46 % 28.6(L)  Platelets 150 - 400 K/uL 162    No images are  attached to the encounter.  DG Chest 2 View  Result Date: 03/15/2020 CLINICAL DATA:  79 year old female with cough for 2-3 months and drainage. Multiple myeloma. EXAM: CHEST - 2 VIEW COMPARISON:  Chest radiographs 08/27/2019 and earlier. FINDINGS: Chronic cardiomegaly, left chest AICD. Improved lung volumes, and resolved small pleural effusions since April. No pneumothorax. Stable pulmonary vascularity, no overt edema. No confluent pulmonary opacity. Calcified aortic atherosclerosis. Stable visualized osseous structures. Negative visible bowel gas pattern. IMPRESSION: Cardiomegaly. Resolved pleural effusions since April. No acute cardiopulmonary abnormality. Electronically Signed   By: Genevie Ann M.D.   On: 03/15/2020 12:14   US Abdomen Complete  Result Date: 03/11/2020 CLINICAL DATA:  Cirrhosis EXAM: ABDOMEN ULTRASOUND COMPLETE COMPARISON:  CT 07/16/2019 FINDINGS: Gallbladder: Prior cholecystectomy Common bile duct: Diameter: Normal caliber, 3 mm Liver: Diffusely increased echotexture and nodular contours. No focal hepatic abnormality. Portal vein is patent on color Doppler imaging with normal direction of blood flow towards the liver. IVC: No abnormality visualized. Pancreas: Visualized portion unremarkable. Spleen: Size and appearance within normal limits. Right Kidney: Length: 8.4 cm. Echogenicity within normal limits. No mass or hydronephrosis visualized. Left Kidney: Length: 8.8 cm. Echogenicity within normal limits. No mass or hydronephrosis visualized. Abdominal aorta: No aneurysm visualized. Other findings: Moderate ascites. IMPRESSION: Changes of hepatic steatosis and cirrhosis. Moderate ascites. No focal hepatic abnormality. Electronically Signed   By: Rolm Baptise M.D.   On: 03/11/2020 20:44   DG Bone Survey Met  Result Date: 03/04/2020 CLINICAL DATA:  Multiple myeloma EXAM: METASTATIC BONE SURVEY COMPARISON:  None. FINDINGS: Metastatic bone survey was performed. Cardiac shadow is enlarged.  Defibrillator is noted. The lungs are clear. No definitive rib abnormality is noted. Cervical spine demonstrates multilevel degenerative change. No lytic or sclerotic lesions are seen. The skull demonstrates a single lucency in the parietal bone consistent with the given clinical history. Upper extremities show no significant lytic lesions. Thoracic spine demonstrates vertebral body height to be well maintained. No paraspinal mass or pedicle abnormality is noted. Degenerative changes of lumbar spine are seen. No lytic lesion is noted. No compression deformities are noted. Scattered lytic lesions are noted within the pubic rami as well as the proximal femurs bilaterally. Distal lower extremities show no definitive lytic or sclerotic lesions. IMPRESSION: Scattered lucencies are noted as described consistent with the given clinical history of myeloma. Electronically Signed   By: Inez Catalina M.D.   On: 03/04/2020 19:59    Assessment and plan- Patient is a 79 y.o. female diagnosed with multiple myeloma currently receiving revlimid, decadron, and velcade who presents to symptom management clinic for multiple complaints.    1. Bruised toe- suspect due to pressure from shoes. Recommended corn pad to decrease pressure point and change in footwear.   2. Constipation. Likely secondary to pain medication and anticholinergics. Recommended bowel regimen including miralax 1-4 times a day to achieve soft BM 1-2 times a day. Add senna 1-2  tablets per day if needed for laxative. Milk of magnesia or magnesium citrate if no BM in 48 hours.   3. Side effect of medication- jittery feeling likely secondary to steroids for treatment of multiple myeloma. Risk vs benefit reviewed. Patient will continue medication  4. Elevated blood sugars- likely secondary to steroids as part of MM tx. Encouraged her to discuss coverage with PCP. Recommended broader target range of sugars as to avoid hypoglycemia.   Follow up with Dr. Grayland Ormond  as scheduled or return to clinic sooner if symptoms don't improve or worsen in the interim.   Visit Diagnosis 1. Bruised toe   2. Constipation due to opioid therapy   3. Side effect of medication    A total of (35) minutes of face-to-face time was spent with this patient with greater than 50% of that time in counseling and care-coordination.  Patient expressed understanding and was in agreement with this plan. She also understands that She can call clinic at any time with any questions, concerns, or complaints.   Thank you for allowing me to participate in the care of this very pleasant patient.   Beckey Rutter, DNP, AGNP-C [place] [facility] [place]

## 2020-03-28 ENCOUNTER — Emergency Department: Payer: Medicare Other

## 2020-03-28 ENCOUNTER — Other Ambulatory Visit: Payer: Self-pay

## 2020-03-28 ENCOUNTER — Inpatient Hospital Stay
Admission: EM | Admit: 2020-03-28 | Discharge: 2020-04-03 | DRG: 522 | Disposition: A | Discharge status: Skilled Nursing Facility | Payer: Medicare Other | Attending: Internal Medicine | Admitting: Internal Medicine

## 2020-03-28 DIAGNOSIS — Z9981 Dependence on supplemental oxygen: Secondary | ICD-10-CM

## 2020-03-28 DIAGNOSIS — J449 Chronic obstructive pulmonary disease, unspecified: Secondary | ICD-10-CM | POA: Diagnosis present

## 2020-03-28 DIAGNOSIS — N189 Chronic kidney disease, unspecified: Secondary | ICD-10-CM | POA: Diagnosis not present

## 2020-03-28 DIAGNOSIS — W19XXXA Unspecified fall, initial encounter: Secondary | ICD-10-CM

## 2020-03-28 DIAGNOSIS — I252 Old myocardial infarction: Secondary | ICD-10-CM | POA: Diagnosis not present

## 2020-03-28 DIAGNOSIS — D696 Thrombocytopenia, unspecified: Secondary | ICD-10-CM | POA: Diagnosis present

## 2020-03-28 DIAGNOSIS — R778 Other specified abnormalities of plasma proteins: Secondary | ICD-10-CM

## 2020-03-28 DIAGNOSIS — S72001A Fracture of unspecified part of neck of right femur, initial encounter for closed fracture: Secondary | ICD-10-CM

## 2020-03-28 DIAGNOSIS — I5042 Chronic combined systolic (congestive) and diastolic (congestive) heart failure: Secondary | ICD-10-CM | POA: Diagnosis present

## 2020-03-28 DIAGNOSIS — F17210 Nicotine dependence, cigarettes, uncomplicated: Secondary | ICD-10-CM | POA: Diagnosis present

## 2020-03-28 DIAGNOSIS — R7989 Other specified abnormal findings of blood chemistry: Secondary | ICD-10-CM

## 2020-03-28 DIAGNOSIS — E1122 Type 2 diabetes mellitus with diabetic chronic kidney disease: Secondary | ICD-10-CM | POA: Diagnosis present

## 2020-03-28 DIAGNOSIS — K529 Noninfective gastroenteritis and colitis, unspecified: Secondary | ICD-10-CM | POA: Diagnosis present

## 2020-03-28 DIAGNOSIS — S72114A Nondisplaced fracture of greater trochanter of right femur, initial encounter for closed fracture: Principal | ICD-10-CM | POA: Diagnosis present

## 2020-03-28 DIAGNOSIS — S51012A Laceration without foreign body of left elbow, initial encounter: Secondary | ICD-10-CM | POA: Diagnosis present

## 2020-03-28 DIAGNOSIS — Z955 Presence of coronary angioplasty implant and graft: Secondary | ICD-10-CM

## 2020-03-28 DIAGNOSIS — I130 Hypertensive heart and chronic kidney disease with heart failure and stage 1 through stage 4 chronic kidney disease, or unspecified chronic kidney disease: Secondary | ICD-10-CM | POA: Diagnosis present

## 2020-03-28 DIAGNOSIS — C9000 Multiple myeloma not having achieved remission: Secondary | ICD-10-CM | POA: Diagnosis present

## 2020-03-28 DIAGNOSIS — R55 Syncope and collapse: Secondary | ICD-10-CM

## 2020-03-28 DIAGNOSIS — I4510 Unspecified right bundle-branch block: Secondary | ICD-10-CM | POA: Diagnosis present

## 2020-03-28 DIAGNOSIS — Z20822 Contact with and (suspected) exposure to covid-19: Secondary | ICD-10-CM | POA: Diagnosis present

## 2020-03-28 DIAGNOSIS — N183 Chronic kidney disease, stage 3 unspecified: Secondary | ICD-10-CM | POA: Diagnosis present

## 2020-03-28 DIAGNOSIS — Z794 Long term (current) use of insulin: Secondary | ICD-10-CM

## 2020-03-28 DIAGNOSIS — D62 Acute posthemorrhagic anemia: Secondary | ICD-10-CM | POA: Diagnosis not present

## 2020-03-28 DIAGNOSIS — I255 Ischemic cardiomyopathy: Secondary | ICD-10-CM | POA: Diagnosis present

## 2020-03-28 DIAGNOSIS — E876 Hypokalemia: Secondary | ICD-10-CM | POA: Diagnosis present

## 2020-03-28 DIAGNOSIS — Z7952 Long term (current) use of systemic steroids: Secondary | ICD-10-CM

## 2020-03-28 DIAGNOSIS — Z9221 Personal history of antineoplastic chemotherapy: Secondary | ICD-10-CM

## 2020-03-28 DIAGNOSIS — N1832 Chronic kidney disease, stage 3b: Secondary | ICD-10-CM | POA: Diagnosis present

## 2020-03-28 DIAGNOSIS — S72009A Fracture of unspecified part of neck of unspecified femur, initial encounter for closed fracture: Secondary | ICD-10-CM | POA: Diagnosis present

## 2020-03-28 DIAGNOSIS — I959 Hypotension, unspecified: Secondary | ICD-10-CM | POA: Diagnosis present

## 2020-03-28 DIAGNOSIS — I2510 Atherosclerotic heart disease of native coronary artery without angina pectoris: Secondary | ICD-10-CM | POA: Diagnosis present

## 2020-03-28 DIAGNOSIS — S7291XA Unspecified fracture of right femur, initial encounter for closed fracture: Secondary | ICD-10-CM | POA: Diagnosis not present

## 2020-03-28 DIAGNOSIS — D649 Anemia, unspecified: Secondary | ICD-10-CM | POA: Diagnosis not present

## 2020-03-28 DIAGNOSIS — E039 Hypothyroidism, unspecified: Secondary | ICD-10-CM | POA: Diagnosis present

## 2020-03-28 DIAGNOSIS — Z96649 Presence of unspecified artificial hip joint: Secondary | ICD-10-CM | POA: Diagnosis not present

## 2020-03-28 DIAGNOSIS — Z888 Allergy status to other drugs, medicaments and biological substances status: Secondary | ICD-10-CM

## 2020-03-28 DIAGNOSIS — I5022 Chronic systolic (congestive) heart failure: Secondary | ICD-10-CM | POA: Diagnosis present

## 2020-03-28 DIAGNOSIS — Z882 Allergy status to sulfonamides status: Secondary | ICD-10-CM

## 2020-03-28 DIAGNOSIS — Z79890 Hormone replacement therapy (postmenopausal): Secondary | ICD-10-CM

## 2020-03-28 DIAGNOSIS — Z880 Allergy status to penicillin: Secondary | ICD-10-CM

## 2020-03-28 DIAGNOSIS — N179 Acute kidney failure, unspecified: Secondary | ICD-10-CM | POA: Diagnosis present

## 2020-03-28 DIAGNOSIS — I420 Dilated cardiomyopathy: Secondary | ICD-10-CM | POA: Diagnosis present

## 2020-03-28 DIAGNOSIS — Z95810 Presence of automatic (implantable) cardiac defibrillator: Secondary | ICD-10-CM

## 2020-03-28 DIAGNOSIS — Z79899 Other long term (current) drug therapy: Secondary | ICD-10-CM

## 2020-03-28 DIAGNOSIS — W1830XA Fall on same level, unspecified, initial encounter: Secondary | ICD-10-CM | POA: Diagnosis present

## 2020-03-28 DIAGNOSIS — Z8249 Family history of ischemic heart disease and other diseases of the circulatory system: Secondary | ICD-10-CM

## 2020-03-28 DIAGNOSIS — R52 Pain, unspecified: Secondary | ICD-10-CM

## 2020-03-28 LAB — GLUCOSE, CAPILLARY: Glucose-Capillary: 293 mg/dL — ABNORMAL HIGH (ref 70–99)

## 2020-03-28 LAB — RESPIRATORY PANEL BY RT PCR (FLU A&B, COVID)
Influenza A by PCR: NEGATIVE
Influenza B by PCR: NEGATIVE
SARS Coronavirus 2 by RT PCR: NEGATIVE

## 2020-03-28 LAB — TROPONIN I (HIGH SENSITIVITY)
Troponin I (High Sensitivity): 34 ng/L — ABNORMAL HIGH (ref <18)
Troponin I (High Sensitivity): 32 ng/L — ABNORMAL HIGH (ref <18)

## 2020-03-28 LAB — COMPREHENSIVE METABOLIC PANEL
ALT: 25 U/L (ref 0–44)
AST: 22 U/L (ref 15–41)
Albumin: 3.3 g/dL — ABNORMAL LOW (ref 3.5–5.0)
Alkaline Phosphatase: 67 U/L (ref 38–126)
Anion gap: 10 (ref 5–15)
BUN: 42 mg/dL — ABNORMAL HIGH (ref 8–23)
CO2: 32 mmol/L (ref 22–32)
Calcium: 7.5 mg/dL — ABNORMAL LOW (ref 8.9–10.3)
Chloride: 94 mmol/L — ABNORMAL LOW (ref 98–111)
Creatinine, Ser: 1.36 mg/dL — ABNORMAL HIGH (ref 0.44–1.00)
GFR, Estimated: 40 mL/min — ABNORMAL LOW (ref >60)
Glucose, Bld: 95 mg/dL (ref 70–99)
Potassium: 2.9 mmol/L — ABNORMAL LOW (ref 3.5–5.1)
Sodium: 136 mmol/L (ref 135–145)
Total Bilirubin: 0.8 mg/dL (ref 0.3–1.2)
Total Protein: 5.6 g/dL — ABNORMAL LOW (ref 6.5–8.1)

## 2020-03-28 LAB — CBC WITH DIFFERENTIAL/PLATELET
Abs Immature Granulocytes: 0.05 10*3/uL (ref 0.00–0.07)
Basophils Absolute: 0 10*3/uL (ref 0.0–0.1)
Basophils Relative: 0 %
Eosinophils Absolute: 0.2 10*3/uL (ref 0.0–0.5)
Eosinophils Relative: 4 %
HCT: 25.7 % — ABNORMAL LOW (ref 36.0–46.0)
Hemoglobin: 8 g/dL — ABNORMAL LOW (ref 12.0–15.0)
Immature Granulocytes: 1 %
Lymphocytes Relative: 12 %
Lymphs Abs: 0.7 10*3/uL (ref 0.7–4.0)
MCH: 29.6 pg (ref 26.0–34.0)
MCHC: 31.1 g/dL (ref 30.0–36.0)
MCV: 95.2 fL (ref 80.0–100.0)
Monocytes Absolute: 0.8 10*3/uL (ref 0.1–1.0)
Monocytes Relative: 15 %
Neutro Abs: 3.9 10*3/uL (ref 1.7–7.7)
Neutrophils Relative %: 68 %
Platelets: 95 10*3/uL — ABNORMAL LOW (ref 150–400)
RBC: 2.7 MIL/uL — ABNORMAL LOW (ref 3.87–5.11)
RDW: 17.2 % — ABNORMAL HIGH (ref 11.5–15.5)
WBC: 5.7 10*3/uL (ref 4.0–10.5)
nRBC: 0 % (ref 0.0–0.2)

## 2020-03-28 LAB — PROTIME-INR
INR: 1.3 — ABNORMAL HIGH (ref 0.8–1.2)
Prothrombin Time: 15.5 seconds — ABNORMAL HIGH (ref 11.4–15.2)

## 2020-03-28 LAB — TYPE AND SCREEN

## 2020-03-28 LAB — BRAIN NATRIURETIC PEPTIDE: B Natriuretic Peptide: 318.3 pg/mL — ABNORMAL HIGH (ref 0.0–100.0)

## 2020-03-28 LAB — MAGNESIUM: Magnesium: 2.6 mg/dL — ABNORMAL HIGH (ref 1.7–2.4)

## 2020-03-28 MED ORDER — LEVOTHYROXINE SODIUM 50 MCG PO TABS: 50.0000 ug | ORAL_TABLET | ORAL | Status: DC

## 2020-03-28 MED ORDER — TRAZODONE HCL 50 MG PO TABS
50.0000 mg | ORAL_TABLET | Freq: Every day | ORAL | Status: DC
  Administered 2020-03-28 – 2020-04-02 (×5): 50 mg via ORAL
  Filled 2020-03-28 (×5): qty 1

## 2020-03-28 MED ORDER — POTASSIUM CHLORIDE 10 MEQ/100ML IV SOLN
10.0000 meq | INTRAVENOUS | Status: AC
  Administered 2020-03-28 (×3): 10 meq via INTRAVENOUS
  Filled 2020-03-28 (×2): qty 100

## 2020-03-28 MED ORDER — MEXILETINE HCL 200 MG PO CAPS
200.0000 mg | ORAL_CAPSULE | Freq: Two times a day (BID) | ORAL | Status: DC
  Administered 2020-03-28 – 2020-04-03 (×12): 200 mg via ORAL
  Filled 2020-03-28 (×14): qty 1

## 2020-03-28 MED ORDER — CEFAZOLIN SODIUM-DEXTROSE 2-4 GM/100ML-% IV SOLN
2.0000 g | INTRAVENOUS | Status: AC
  Administered 2020-03-29: 2 g via INTRAVENOUS
  Filled 2020-03-28 (×2): qty 100

## 2020-03-28 MED ORDER — ACETAMINOPHEN 500 MG PO TABS
1000.0000 mg | ORAL_TABLET | Freq: Once | ORAL | Status: AC
  Administered 2020-03-28: 1000 mg via ORAL
  Filled 2020-03-28: qty 2

## 2020-03-28 MED ORDER — FENTANYL CITRATE (PF) 100 MCG/2ML IJ SOLN
25.0000 ug | Freq: Once | INTRAMUSCULAR | Status: DC
  Filled 2020-03-28: qty 2

## 2020-03-28 MED ORDER — ASPIRIN EC 81 MG PO TBEC
81.0000 mg | DELAYED_RELEASE_TABLET | Freq: Every day | ORAL | Status: DC
  Administered 2020-03-28 – 2020-04-02 (×6): 81 mg via ORAL
  Filled 2020-03-28 (×6): qty 1

## 2020-03-28 MED ORDER — SIMVASTATIN 20 MG PO TABS
20.0000 mg | ORAL_TABLET | Freq: Every day | ORAL | Status: DC
  Administered 2020-03-28 – 2020-04-02 (×6): 20 mg via ORAL
  Filled 2020-03-28 (×6): qty 1

## 2020-03-28 MED ORDER — MIDODRINE HCL 5 MG PO TABS
5.0000 mg | ORAL_TABLET | Freq: Two times a day (BID) | ORAL | Status: DC
  Administered 2020-03-28: 5 mg via ORAL
  Filled 2020-03-28 (×2): qty 1

## 2020-03-28 MED ORDER — LEVOTHYROXINE SODIUM 50 MCG PO TABS: 75.0000 ug | ORAL_TABLET | ORAL | Status: DC

## 2020-03-28 MED ORDER — LACTATED RINGERS IV SOLN: INTRAVENOUS | Status: DC

## 2020-03-28 MED ORDER — HYDROCODONE-ACETAMINOPHEN 5-325 MG PO TABS
1.0000 | ORAL_TABLET | Freq: Four times a day (QID) | ORAL | Status: DC | PRN
  Administered 2020-03-28: 1 via ORAL
  Filled 2020-03-28 (×3): qty 2
  Filled 2020-03-28: qty 1

## 2020-03-28 MED ORDER — POTASSIUM CHLORIDE 10 MEQ/100ML IV SOLN
10.0000 meq | INTRAVENOUS | Status: DC
  Filled 2020-03-28: qty 100

## 2020-03-28 MED ORDER — FENTANYL CITRATE (PF) 100 MCG/2ML IJ SOLN
25.0000 ug | Freq: Once | INTRAMUSCULAR | Status: AC
  Administered 2020-03-28: 25 ug via INTRAVENOUS
  Filled 2020-03-28: qty 2

## 2020-03-28 MED ORDER — COLCHICINE 0.6 MG PO TABS: 0.6000 mg | ORAL_TABLET | Freq: Two times a day (BID) | ORAL | Status: DC

## 2020-03-28 MED ORDER — MIDODRINE HCL 5 MG PO TABS
5.0000 mg | ORAL_TABLET | Freq: Every day | ORAL | Status: DC | PRN
  Administered 2020-03-30: 5 mg via ORAL
  Filled 2020-03-28 (×3): qty 1

## 2020-03-28 MED ORDER — PANTOPRAZOLE SODIUM 40 MG PO TBEC
40.0000 mg | DELAYED_RELEASE_TABLET | Freq: Every day | ORAL | Status: DC
  Administered 2020-03-29 – 2020-04-03 (×6): 40 mg via ORAL
  Filled 2020-03-28 (×6): qty 1

## 2020-03-28 MED ORDER — MAGNESIUM HYDROXIDE 400 MG/5ML PO SUSP: 30.0000 mL | Freq: Every day | ORAL | Status: DC | PRN

## 2020-03-28 MED ORDER — MAGNESIUM OXIDE 400 MG PO TABS
200.0000 mg | ORAL_TABLET | Freq: Every day | ORAL | Status: DC
  Administered 2020-03-29 – 2020-04-03 (×7): 200 mg via ORAL
  Filled 2020-03-28: qty 1
  Filled 2020-03-28: qty 0.5
  Filled 2020-03-28: qty 1
  Filled 2020-03-28: qty 0.5
  Filled 2020-03-28: qty 1
  Filled 2020-03-28 (×2): qty 0.5
  Filled 2020-03-28 (×2): qty 1
  Filled 2020-03-28 (×2): qty 0.5
  Filled 2020-03-28: qty 1
  Filled 2020-03-28: qty 0.5

## 2020-03-28 MED ORDER — LACTATED RINGERS IV BOLUS
500.0000 mL | Freq: Once | INTRAVENOUS | Status: AC
  Administered 2020-03-28: 500 mL via INTRAVENOUS

## 2020-03-28 MED ORDER — MAGNESIUM CITRATE PO SOLN
1.0000 | Freq: Once | ORAL | Status: DC | PRN
  Filled 2020-03-28: qty 296

## 2020-03-28 MED ORDER — INSULIN ASPART 100 UNIT/ML ~~LOC~~ SOLN
0.0000 [IU] | SUBCUTANEOUS | Status: DC
  Administered 2020-03-28: 3 [IU] via SUBCUTANEOUS
  Administered 2020-03-29: 8 [IU] via SUBCUTANEOUS
  Administered 2020-03-30: 15 [IU] via SUBCUTANEOUS
  Administered 2020-03-30 (×2): 3 [IU] via SUBCUTANEOUS
  Administered 2020-03-31 – 2020-04-01 (×3): 2 [IU] via SUBCUTANEOUS
  Filled 2020-03-28 (×7): qty 1

## 2020-03-28 MED ORDER — ALLOPURINOL 100 MG PO TABS
50.0000 mg | ORAL_TABLET | Freq: Every day | ORAL | Status: DC
  Administered 2020-03-29 – 2020-04-03 (×6): 50 mg via ORAL
  Filled 2020-03-28 (×7): qty 0.5

## 2020-03-28 MED ORDER — HYDROCODONE-ACETAMINOPHEN 5-325 MG PO TABS
1.0000 | ORAL_TABLET | ORAL | Status: DC | PRN
  Administered 2020-03-29 (×3): 2 via ORAL
  Administered 2020-03-30: 1 via ORAL
  Administered 2020-03-30 – 2020-04-01 (×5): 2 via ORAL
  Administered 2020-04-01: 1 via ORAL
  Filled 2020-03-28: qty 1
  Filled 2020-03-28 (×3): qty 2
  Filled 2020-03-28: qty 1
  Filled 2020-03-28 (×2): qty 2
  Filled 2020-03-28: qty 1

## 2020-03-28 NOTE — H&P (Addendum)
History and Physical    Heather Peterson XIP:[id number] DOB: 29-Aug-1940 DOA: 03/28/2020  PCP: [name], MD  Patient coming from: home   Chief Complaint: s/p fall with right hip pain  HPI: Heather Peterson is a 79 y.o. female with medical history significant ofCAD, combined CHF, CKD, DM, hypothyroid disease, 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 (sees Dr. Grayland Ormond) presents s/p fall today.  She reports having a lot of energy lately and she got up too quickly to go to the bathroom , she turned around and then fell. She felt dizzy and lightheaded prior to fall when she got up from sitting position. No chest pain, shortness of breath, or loss of consciousness.  She does remember going down and being down for 20 minutes until her children came over to pick her up.  Again she denies any loss of consciousness.  She has right hip pain but denies left hip or arm or shoulder pain.  Denies back pain She does report taking her diuretics yesterday but not today.  She endorses that she drinks and eats okay.  She reports that her daughter is trying to give her Gatorade.  ED Course: In the ER she was found with initial blood pressure of 89/52, repeat was 93/57.  69, respiratory rate 16 afebrile, 99% on 2 L which is her baseline. she was started on IV fluids.  Potassium is 2.9.  Creatinine 1.36. Chest x-ray was negative for pulmonary edema.  Stable cardiomegaly. Hip x-ray reveals Comminuted mildly impacted right transcervical femoral neck fracture involving the greater trochanter.Orthopedic Dr. Roland Rack was consulted by the ER.     Review of Systems: All systems reviewed and otherwise negative.    Past Medical History:  Diagnosis Date  . Anxiety   . Chronic combined systolic and diastolic CHF (congestive heart failure) ([place])   . Chronic kidney disease   . Coronary artery disease   . Depression   . Diabetes mellitus without complication ([place])   . Diabetes mellitus, type II ([place])   .  Hypertension   . MI (myocardial infarction) ([place])    x 5  . Pacemaker   . Prolonged Q-T interval on ECG   . Thyroid disease     Past Surgical History:  Procedure Laterality Date  . CENTRAL LINE INSERTION  03/11/2017   Procedure: CENTRAL LINE INSERTION;  Surgeon: Leonie Man, MD;  Location: [place] [facility];  Service: Cardiovascular;;  . CHOLECYSTECTOMY    . COLONOSCOPY WITH PROPOFOL N/A 09/01/2019   Procedure: COLONOSCOPY WITH PROPOFOL;  Surgeon: Toledo, Benay Pike, MD;  Location: [facility] ENDOSCOPY;  Service: Gastroenterology;  Laterality: N/A;  . CORONARY STENT INTERVENTION W/IMPELLA N/A 03/11/2017   Procedure: Coronary Stent Intervention w/Impella;  Surgeon: Leonie Man, MD;  Location: [place] [facility];  Service: Cardiovascular;  Laterality: N/A;  . ESOPHAGOGASTRODUODENOSCOPY (EGD) WITH PROPOFOL N/A 09/01/2019   Procedure: ESOPHAGOGASTRODUODENOSCOPY (EGD) WITH PROPOFOL;  Surgeon: Toledo, Benay Pike, MD;  Location: [facility] ENDOSCOPY;  Service: Gastroenterology;  Laterality: N/A;  . ESOPHAGOGASTRODUODENOSCOPY (EGD) WITH PROPOFOL N/A 09/08/2019   Procedure: ESOPHAGOGASTRODUODENOSCOPY (EGD) WITH PROPOFOL;  Surgeon: [name], MD;  Location: [facility]OSCOPY;  Service: Gastroenterology;  Laterality: N/A;  . EYE SURGERY    . INTRAVASCULAR PRESSURE WIRE/FFR STUDY N/A 03/11/2017   Procedure: INTRAVASCULAR PRESSURE WIRE/FFR STUDY;  Surgeon: Leonie Man, MD;  Location: [place] [facility];  Service: Cardiovascular;  Laterality: N/A;  . INTRAVASCULAR ULTRASOUND/IVUS N/A 03/11/2017   Procedure: Intravascular Ultrasound/IVUS;  Surgeon: Leonie Man,  MD;  Location: [place] [facility];  Service: Cardiovascular;  Laterality: N/A;  . LEFT HEART CATH AND CORONARY ANGIOGRAPHY N/A 03/05/2017   Procedure: LEFT HEART CATH AND CORONARY ANGIOGRAPHY;  Surgeon: Isaias Cowman, MD;  Location: [place] [facility];  Service: Cardiovascular;  Laterality: N/A;  . PACEMAKER IMPLANT    .  pacemaker/defibrillator Left      reports that she has been smoking e-cigarettes and cigarettes. She has been smoking about 0.25 packs per day. She has never used smokeless tobacco. She reports previous alcohol use. She reports that she does not use drugs.  Allergies  Allergen Reactions  . Celebrex [Celecoxib] Anaphylaxis  . Glipizide Anaphylaxis  . Lisinopril Swelling    Lip and facial swelling  . Sulfa Antibiotics Other (See Comments) and Anaphylaxis    Reaction: unknown  . Levaquin [Levofloxacin In D5w] Other (See Comments)    Heart arrhthymias  . Levofloxacin Other (See Comments)    ICD fired  . Metformin Other (See Comments)    Gi tolerance   . Penicillins Rash and Other (See Comments)    Has patient had a PCN reaction causing immediate rash, facial/tongue/throat swelling, SOB or lightheadedness with hypotension: Unknown Has patient had a PCN reaction causing severe rash involving mucus membranes or skin necrosis: No Has patient had a PCN reaction that required hospitalization: No Has patient had a PCN reaction occurring within the last 10 years: No If all of the above answers are "NO", then may proceed with Cephalosporin use.     Family History  Problem Relation Age of Onset  . Hypertension Father   . Heart attack Father   . Depression Sister   . Depression Brother   . Depression Brother      Prior to Admission medications   Medication Sig Start Date End Date Taking? Authorizing Provider  allopurinol (ZYLOPRIM) 100 MG tablet Take 50 mg by mouth daily.    [provider]  aspirin EC 81 MG tablet Take 81 mg by mouth at bedtime.     [provider]  chlorpheniramine-HYDROcodone (TUSSIONEX) 10-8 MG/5ML SUER Take 5 mLs by mouth every 12 (twelve) hours as needed for cough. 03/14/20   Jacquelin Hawking, NP  colchicine 0.6 MG tablet Take 0.6 mg by mouth 2 (two) times daily. 10/08/19   [provider]  dexamethasone (DECADRON) 4 MG tablet Take 5  tablets (20 mg total) by mouth daily. Take the day after Velcade on days 2,5,9,12. Take with breakfast 03/01/20   Lloyd Huger, MD  diphenoxylate-atropine (LOMOTIL) 2.5-0.025 MG tablet Take 1 tablet by mouth 4 (four) times daily as needed for diarrhea or loose stools. 03/20/20   Lloyd Huger, MD  fexofenadine (ALLEGRA) 180 MG tablet Take 180 mg by mouth daily.    [provider]  FLUoxetine (PROZAC) 10 MG capsule Take 10 mg by mouth daily. 06/29/19   [provider]  folic acid (FOLVITE) 1 MG tablet folic acid 1 mg tablet    [provider]  furosemide (LASIX) 20 MG tablet furosemide 20 mg tablet  TAKE 1 TABLET BY MOUTH EVERY DAY AS NEEDED FOR FLUID OR EDEMA. FOR LEG SWELLING IF YOUR WEIGHT GOES OVER 5 LBS FROM BASELINE    [provider]  lenalidomide (REVLIMID) 10 MG capsule Take 1 capsule (10 mg total) by mouth daily. Take 14 days on, 7 days off, repeat every 21 days 03/20/20   Lloyd Huger, MD  levothyroxine (SYNTHROID) 50 MCG tablet Take 50  mcg by mouth See admin instructions. Take 1 tablet (2mg) by mouth every Tuesday, Thursday, Saturday and Sunday before breakfast    [provider]  levothyroxine (SYNTHROID, LEVOTHROID) 75 MCG tablet Take 75 mcg by mouth See admin instructions. Take 1 tablet (714m) by mouth every Monday, Wednesday and Friday morning before breakfast    [provider]  magnesium oxide (MAG-OX) 400 MG tablet Take 200 mg by mouth daily.    [provider]  mexiletine (MEXITIL) 200 MG capsule Take 1 capsule (200 mg total) by mouth every 12 (twelve) hours. 03/14/17   SeChanetta Marshall, NP  midodrine (PROAMATINE) 5 MG tablet Take 5 mg by mouth daily as needed.     [provider]  ondansetron (ZOFRAN) 8 MG tablet Take 1 tablet (8 mg total) by mouth 2 (two) times daily as needed (Nausea or vomiting). 03/01/20   FiLloyd HugerMD  OXYGEN Inhale 2 L into the lungs continuous.    [provider]  pantoprazole (PROTONIX) 40 MG tablet Take 40 mg by mouth daily.  05/16/19   [provider]  potassium chloride SA (KLOR-CON) 20 MEQ tablet potassium chloride ER 20 mEq tablet,extended release(part/cryst)    [provider]  predniSONE (DELTASONE) 20 MG tablet Take 20 mg by mouth 2 (two) times daily. 10/08/19   [provider]  Prenatal Vit-Fe Fumarate-FA (PRENATAL MULTIVITAMIN) TABS tablet Take 1 tablet by mouth daily.    [provider]  prochlorperazine (COMPAZINE) 10 MG tablet Take 1 tablet (10 mg total) by mouth every 6 (six) hours as needed (Nausea or vomiting). 03/01/20   FiLloyd HugerMD  simvastatin (ZOCOR) 40 MG tablet Take 20 mg by mouth at bedtime.     [provider]  spironolactone (ALDACTONE) 25 MG tablet spironolactone 25 mg tablet    [provider]  sucralfate (CARAFATE) 1 g tablet Take 1 tablet by mouth 3 (three) times daily before meals. 09/15/19 09/14/20  [provider]  torsemide (DEMADEX) 20 MG tablet Take 2 tablets (40 mg total) by mouth daily. Future refills need to come from kidney doctor 02/21/20   HaAlisa GraffFNP  traZODone (DESYREL) 50 MG tablet Take 50 mg by mouth at bedtime.    [provider]    Physical Exam: Vitals:   03/28/20 1534 03/28/20 1600 03/28/20 1630 03/28/20 1647  BP: (!) 89/52 (!) 90/51 (!) 82/51 (!) 81/51  Pulse: 73 72 71   Resp: (!) 8 13 18 15   SpO2: 100% 100% 97% 95%    Constitutional: NAD, calm, comfortable Vitals:   03/28/20 1534 03/28/20 1600 03/28/20 1630 03/28/20 1647  BP: (!) 89/52 (!) 90/51 (!) 82/51 (!) 81/51  Pulse: 73 72 71   Resp: (!) 8 13 18 15   SpO2: 100% 100% 97% 95%   Eyes: PERRL, lids and conjunctivae normal Neck: normal, supple, no masses Respiratory: clear to auscultation bilaterally, no wheezing, no crackles. Normal respiratory effort. No accessory muscle use.  Cardiovascular: Regular rate and rhythm, no murmurs / rubs /  gallops. No extremity edema.No carotid bruits.  Abdomen: no tenderness, no masses palpated. No hepatosplenomegaly. Bowel sounds positive.  Musculoskeletal:  No joint deformity upper and lower extremities. Skin: multiple ecchymosis  Neurologic: CN 2-12 grossly intact. Sensation intact Psychiatric: Normal judgment and insight. Alert and oriented x 3. Normal mood.    Labs on Admission: I have personally reviewed following labs and imaging studies  CBC: Recent Labs  Lab 03/28/20 1435  WBC  5.7  NEUTROABS 3.9  HGB 8.0*  HCT 25.7*  MCV 95.2  PLT 95*   Basic Metabolic Panel: Recent Labs  Lab 03/28/20 1435  NA 136  K 2.9*  CL 94*  CO2 32  GLUCOSE 95  BUN 42*  CREATININE 1.36*  CALCIUM 7.5*  MG 2.6*   GFR: Estimated Creatinine Clearance: 34.4 mL/min (A) (by C-G formula based on SCr of 1.36 mg/dL (H)). Liver Function Tests: Recent Labs  Lab 03/28/20 1435  AST 22  ALT 25  ALKPHOS 67  BILITOT 0.8  PROT 5.6*  ALBUMIN 3.3*   No results for input(s): LIPASE, AMYLASE in the last 168 hours. No results for input(s): AMMONIA in the last 168 hours. Coagulation Profile: No results for input(s): INR, PROTIME in the last 168 hours. Cardiac Enzymes: No results for input(s): CKTOTAL, CKMB, CKMBINDEX, TROPONINI in the last 168 hours. BNP (last 3 results) No results for input(s): PROBNP in the last 8760 hours. HbA1C: No results for input(s): HGBA1C in the last 72 hours. CBG: No results for input(s): GLUCAP in the last 168 hours. Lipid Profile: No results for input(s): CHOL, HDL, LDLCALC, TRIG, CHOLHDL, LDLDIRECT in the last 72 hours. Thyroid Function Tests: No results for input(s): TSH, T4TOTAL, FREET4, T3FREE, THYROIDAB in the last 72 hours. Anemia Panel: No results for input(s): VITAMINB12, FOLATE, FERRITIN, TIBC, IRON, RETICCTPCT in the last 72 hours. Urine analysis:    Component Value Date/Time   COLORURINE YELLOW 09/04/2019 1800   APPEARANCEUR CLEAR 09/04/2019 1800    LABSPEC 1.015 09/04/2019 1800   PHURINE 7.5 09/04/2019 1800   GLUCOSEU NEGATIVE 09/04/2019 1800   HGBUR LARGE (A) 09/04/2019 1800   BILIRUBINUR NEGATIVE 09/04/2019 1800   KETONESUR NEGATIVE 09/04/2019 1800   PROTEINUR 30 (A) 09/04/2019 1800   NITRITE NEGATIVE 09/04/2019 1800   LEUKOCYTESUR SMALL (A) 09/04/2019 1800    Radiological Exams on Admission: DG Chest 1 View  Result Date: 03/28/2020 CLINICAL DATA:  Fall.  Landed on her RIGHT side.  RIGHT hip pain. EXAM: CHEST  1 VIEW COMPARISON:  03/15/2020 FINDINGS: Heart is enlarged, stable in configuration. LEFT-sided AICD is unchanged. No focal consolidations or pleural effusions. No pulmonary edema. Atherosclerotic calcification of the thoracic aorta. IMPRESSION: Stable cardiomegaly. Electronically Signed   By: Nolon Nations M.D.   On: 03/28/2020 15:38   CT Head Wo Contrast  Result Date: 03/28/2020 CLINICAL DATA:  Trauma. EXAM: CT HEAD WITHOUT CONTRAST CT CERVICAL SPINE WITHOUT CONTRAST TECHNIQUE: Multidetector CT imaging of the head and cervical spine was performed following the standard protocol without intravenous contrast. Multiplanar CT image reconstructions of the cervical spine were also generated. COMPARISON:  None. FINDINGS: CT HEAD FINDINGS Brain: No evidence of acute large vascular territory infarction, hemorrhage, hydrocephalus, extra-axial collection or mass lesion/mass effect. Remote right frontal infarct with encephalomalacia. Patchy white matter hypoattenuation, compatible with chronic microvascular ischemic disease. Generalized cerebral atrophy. Vascular: Calcific atherosclerosis. Skull: No acute fracture Sinuses/Orbits: Visualized sinuses are clear. Unremarkable visualized orbits. Other: No mastoid effusions. CT CERVICAL SPINE FINDINGS Alignment: Mild reversal of the normal cervical lordosis, likely positional. Mild anterolisthesis of C3 on C4, likely degenerative given facet arthropathy at this level. Otherwise, no substantial  subluxation. Skull base and vertebrae: No acute fracture. Vertebral body heights are maintained. Soft tissues and spinal canal: No prevertebral fluid or swelling. No visible canal hematoma. Disc levels: Mild multilevel degenerative disc disease. Multilevel facet arthropathy, greatest at C3-C4. Upper chest: Emphysema. Other: Calcific atherosclerosis of the carotids. IMPRESSION: CT head: 1. No evidence of  acute intracranial abnormality. 2. Remote right frontal infarct with encephalomalacia. 3. Chronic microvascular ischemic disease. CT cervical spine: No evidence of acute fracture or traumatic malalignment. Electronically Signed   By: Margaretha Sheffield MD   On: 03/28/2020 15:45   CT Cervical Spine Wo Contrast  Result Date: 03/28/2020 CLINICAL DATA:  Trauma. EXAM: CT HEAD WITHOUT CONTRAST CT CERVICAL SPINE WITHOUT CONTRAST TECHNIQUE: Multidetector CT imaging of the head and cervical spine was performed following the standard protocol without intravenous contrast. Multiplanar CT image reconstructions of the cervical spine were also generated. COMPARISON:  None. FINDINGS: CT HEAD FINDINGS Brain: No evidence of acute large vascular territory infarction, hemorrhage, hydrocephalus, extra-axial collection or mass lesion/mass effect. Remote right frontal infarct with encephalomalacia. Patchy white matter hypoattenuation, compatible with chronic microvascular ischemic disease. Generalized cerebral atrophy. Vascular: Calcific atherosclerosis. Skull: No acute fracture Sinuses/Orbits: Visualized sinuses are clear. Unremarkable visualized orbits. Other: No mastoid effusions. CT CERVICAL SPINE FINDINGS Alignment: Mild reversal of the normal cervical lordosis, likely positional. Mild anterolisthesis of C3 on C4, likely degenerative given facet arthropathy at this level. Otherwise, no substantial subluxation. Skull base and vertebrae: No acute fracture. Vertebral body heights are maintained. Soft tissues and spinal canal: No  prevertebral fluid or swelling. No visible canal hematoma. Disc levels: Mild multilevel degenerative disc disease. Multilevel facet arthropathy, greatest at C3-C4. Upper chest: Emphysema. Other: Calcific atherosclerosis of the carotids. IMPRESSION: CT head: 1. No evidence of acute intracranial abnormality. 2. Remote right frontal infarct with encephalomalacia. 3. Chronic microvascular ischemic disease. CT cervical spine: No evidence of acute fracture or traumatic malalignment. Electronically Signed   By: Margaretha Sheffield MD   On: 03/28/2020 15:45   DG Hip Unilat W or Wo Pelvis 2-3 Views Right  Result Date: 03/28/2020 CLINICAL DATA:  Fall EXAM: DG HIP (WITH OR WITHOUT PELVIS) 2-3V RIGHT COMPARISON:  None. FINDINGS: There is a comminuted impacted minimally displaced fracture of the right transcervical femoral neck involving the greater trochanter. The femoral head is still well seated within the acetabulum. Overlying soft tissue swelling is noted. IMPRESSION: Comminuted mildly impacted right transcervical femoral neck fracture involving the greater trochanter. Electronically Signed   By: Prudencio Pair M.D.   On: 03/28/2020 15:38    EKG: Independently reviewed. Appears sr with pac  Assessment/Plan Active Problems:   CHF (congestive heart failure), NYHA class III, chronic, combined (HCC)   Ischemic cardiomyopathy   CKD (chronic kidney disease), stage III (HCC)   Multiple myeloma (HCC)   Hip fracture ([place])   1. Comminuted mildly impacted right transcervical femoral neck fracture involving the greater trochanter. -s/p fall- likely due to hypotension Reports no loss of consciousness Orthopdic was consulted, Dr. Roland Rack to see patient Will keep n.p.o. Cardiology consulted for preop clearance IV fluids gentle hydration as patient has history of CHF   2.Combined CHF/cardiomyopathy euvolemic on exam. More on the dry side. Last echo in 2018-EF 35 to 40% Cardiology consulted for preop clearance and  management Continue aspirin, statin Hold her diuretics as she is hypotensive Monitor I's and O's Caution with IV fluids for gentle hydration Daily weight ?on midrodrine for hypotension prn- pharmacy to reconciliate meds  3. CKD stage 3b- baseline 1.63-2.14 Currently creatinine 1.36 Continue to monitor Avoid nephrotoxic meds  4. Multiple myeloma- on chemotherapy. ? On steroid. Have asked pharmacy to do med rec. Reconciliation Follows Dr. Grayland Ormond   DVT prophylaxis: SCD  Code Status: Full  Family Communication: none at bedside  Disposition Plan: TBD  Consults called: cardiology, orthopedic  Admission status: Inpatient as patient requires more than 2 midnight stays due to severity of her illness she is requiring hospitalization.   Nolberto Hanlon MD [facility] Pager [phone]  If 7PM-7AM, please contact night-coverage [email or web address] Password [facility]  03/28/2020, 4:52 PM

## 2020-03-28 NOTE — Consult Note (Signed)
ORTHOPAEDIC CONSULTATION  REQUESTING PHYSICIAN: Nolberto Hanlon, MD  Chief Complaint:   Right hip pain.  History of Present Illness: Heather Peterson is a 79 y.o. female with multiple medical problems including coronary artery disease and status post an MI x5, hypertension, adult-onset diabetes, anxiety/depression, chronic kidney disease, congestive heart failure, and hypothyroidism who has recently been undergoing a course of chemotherapy for multiple myeloma.  The patient lives independently with her daughter and does not use any assistive devices.  She was in her usual state of health earlier today until she got up from sitting.  She felt somewhat lightheaded and dizzy so she decided to go to her bedroom to lie down.  She made it to the door for her bedroom when she fell, injuring her right hip.  The patient denies any associated injuries.  She did not strike her head or lose consciousness.  She does note some lightheadedness/dizziness which may have helped precipitated the fall, but denies any shortness of breath, chest pain or other symptoms which may have contributed to her fall.  Past Medical History:  Diagnosis Date  . Anxiety   . Chronic combined systolic and diastolic CHF (congestive heart failure) ([place])   . Chronic kidney disease   . Coronary artery disease   . Depression   . Diabetes mellitus without complication ([place])   . Diabetes mellitus, type II ([place])   . Hypertension   . MI (myocardial infarction) ([place])    x 5  . Pacemaker   . Prolonged Q-T interval on ECG   . Thyroid disease    Past Surgical History:  Procedure Laterality Date  . CENTRAL LINE INSERTION  03/11/2017   Procedure: CENTRAL LINE INSERTION;  Surgeon: Leonie Man, MD;  Location: [place] [facility];  Service: Cardiovascular;;  . CHOLECYSTECTOMY    . COLONOSCOPY WITH PROPOFOL N/A 09/01/2019   Procedure: COLONOSCOPY WITH PROPOFOL;  Surgeon:  Toledo, Benay Pike, MD;  Location: [facility];  Service: Gastroenterology;  Laterality: N/A;  . CORONARY STENT INTERVENTION W/IMPELLA N/A 03/11/2017   Procedure: Coronary Stent Intervention w/Impella;  Surgeon: Leonie Man, MD;  Location: [place] [facility];  Service: Cardiovascular;  Laterality: N/A;  . ESOPHAGOGASTRODUODENOSCOPY (EGD) WITH PROPOFOL N/A 09/01/2019   Procedure: ESOPHAGOGASTRODUODENOSCOPY (EGD) WITH PROPOFOL;  Surgeon: Toledo, Benay Pike, MD;  Location: [facility];  Service: Gastroenterology;  Laterality: N/A;  . ESOPHAGOGASTRODUODENOSCOPY (EGD) WITH PROPOFOL N/A 09/08/2019   Procedure: ESOPHAGOGASTRODUODENOSCOPY (EGD) WITH PROPOFOL;  Surgeon: [name], MD;  Location: [facility]OSCOPY;  Service: Gastroenterology;  Laterality: N/A;  . EYE SURGERY    . INTRAVASCULAR PRESSURE WIRE/FFR STUDY N/A 03/11/2017   Procedure: INTRAVASCULAR PRESSURE WIRE/FFR STUDY;  Surgeon: Leonie Man, MD;  Location: [place] [facility];  Service: Cardiovascular;  Laterality: N/A;  . INTRAVASCULAR ULTRASOUND/IVUS N/A 03/11/2017   Procedure: Intravascular Ultrasound/IVUS;  Surgeon: Leonie Man, MD;  Location: [place] [facility];  Service: Cardiovascular;  Laterality: N/A;  . LEFT HEART CATH AND CORONARY ANGIOGRAPHY N/A 03/05/2017   Procedure: LEFT HEART CATH AND CORONARY ANGIOGRAPHY;  Surgeon: Isaias Cowman, MD;  Location: [place] [facility];  Service: Cardiovascular;  Laterality: N/A;  . PACEMAKER IMPLANT    . pacemaker/defibrillator Left    Social History   Socioeconomic History  . Marital status: Married    Spouse name: rodney  . Number of children: 2  . Years of education: Not on file  . Highest education level: High school graduate  Occupational History  . Not on file  Tobacco Use  . Smoking status: Current Every Day Smoker    Packs/day: 0.25    Types: E-cigarettes, Cigarettes  . Smokeless tobacco: Never Used  Vaping Use  . Vaping Use: Former  Substance and  Sexual Activity  . Alcohol use: Not Currently    Comment: occasionally  . Drug use: No  . Sexual activity: Not Currently  Other Topics Concern  . Not on file  Social History Narrative  . Not on file   Social Determinants of Health   Financial Resource Strain:   . Difficulty of Paying Living Expenses: Not on file  Food Insecurity:   . Worried About Charity fundraiser in the Last Year: Not on file  . Ran Out of Food in the Last Year: Not on file  Transportation Needs:   . Lack of Transportation (Medical): Not on file  . Lack of Transportation (Non-Medical): Not on file  Physical Activity:   . Days of Exercise per Week: Not on file  . Minutes of Exercise per Session: Not on file  Stress:   . Feeling of Stress : Not on file  Social Connections:   . Frequency of Communication with Friends and Family: Not on file  . Frequency of Social Gatherings with Friends and Family: Not on file  . Attends Religious Services: Not on file  . Active Member of Clubs or Organizations: Not on file  . Attends Archivist Meetings: Not on file  . Marital Status: Not on file   Family History  Problem Relation Age of Onset  . Hypertension Father   . Heart attack Father   . Depression Sister   . Depression Brother   . Depression Brother    Allergies  Allergen Reactions  . Celebrex [Celecoxib] Anaphylaxis  . Glipizide Anaphylaxis  . Lisinopril Swelling    Lip and facial swelling  . Sulfa Antibiotics Other (See Comments) and Anaphylaxis    Reaction: unknown  . Levaquin [Levofloxacin In D5w] Other (See Comments)    Heart arrhthymias  . Levofloxacin Other (See Comments)    ICD fired  . Metformin Other (See Comments)    Gi tolerance   . Penicillins Rash and Other (See Comments)    Has patient had a PCN reaction causing immediate rash, facial/tongue/throat swelling, SOB or lightheadedness with hypotension: Unknown Has patient had a PCN reaction causing severe rash involving mucus  membranes or skin necrosis: No Has patient had a PCN reaction that required hospitalization: No Has patient had a PCN reaction occurring within the last 10 years: No If all of the above answers are "NO", then may proceed with Cephalosporin use.    Prior to Admission medications   Medication Sig Start Date End Date Taking? Authorizing Provider  allopurinol (ZYLOPRIM) 100 MG tablet Take 50 mg by mouth daily.    [provider]  aspirin EC 81 MG tablet Take 81 mg by mouth at bedtime.     [provider]  chlorpheniramine-HYDROcodone (TUSSIONEX) 10-8 MG/5ML SUER Take 5 mLs by mouth every 12 (twelve) hours as needed for cough. 03/14/20   Jacquelin Hawking, NP  colchicine 0.6 MG tablet Take 0.6 mg by mouth 2 (two) times daily. 10/08/19   [provider]  dexamethasone (DECADRON) 4 MG tablet Take 5 tablets (20 mg total) by mouth daily. Take the day after Velcade on days 2,5,9,12. Take with breakfast 03/01/20   Lloyd Huger, MD  diphenoxylate-atropine (LOMOTIL) 2.5-0.025 MG tablet Take 1 tablet by mouth 4 (  four) times daily as needed for diarrhea or loose stools. 03/20/20   Lloyd Huger, MD  fexofenadine (ALLEGRA) 180 MG tablet Take 180 mg by mouth daily.    [provider]  FLUoxetine (PROZAC) 10 MG capsule Take 10 mg by mouth daily. 06/29/19   [provider]  folic acid (FOLVITE) 1 MG tablet folic acid 1 mg tablet    [provider]  furosemide (LASIX) 20 MG tablet furosemide 20 mg tablet  TAKE 1 TABLET BY MOUTH EVERY DAY AS NEEDED FOR FLUID OR EDEMA. FOR LEG SWELLING IF YOUR WEIGHT GOES OVER 5 LBS FROM BASELINE    [provider]  LANTUS SOLOSTAR 100 UNIT/ML Solostar Pen Inject 16 Units into the skin at bedtime. 03/09/20   [provider]  lenalidomide (REVLIMID) 10 MG capsule Take 1 capsule (10 mg total) by mouth daily. Take 14 days on, 7 days off, repeat every 21 days 03/20/20   Lloyd Huger, MD  levothyroxine  (SYNTHROID) 50 MCG tablet Take 50 mcg by mouth See admin instructions. Take 1 tablet (66mg) by mouth every Tuesday, Thursday, Saturday and Sunday before breakfast    [provider]  levothyroxine (SYNTHROID, LEVOTHROID) 75 MCG tablet Take 75 mcg by mouth See admin instructions. Take 1 tablet (729m) by mouth every Monday, Wednesday and Friday morning before breakfast    [provider]  magnesium oxide (MAG-OX) 400 MG tablet Take 200 mg by mouth daily.    [provider]  mexiletine (MEXITIL) 200 MG capsule Take 1 capsule (200 mg total) by mouth every 12 (twelve) hours. 03/14/17   SeChanetta Marshall, NP  midodrine (PROAMATINE) 5 MG tablet Take 5 mg by mouth daily as needed.     [provider]  ondansetron (ZOFRAN) 8 MG tablet Take 1 tablet (8 mg total) by mouth 2 (two) times daily as needed (Nausea or vomiting). 03/01/20   FiLloyd HugerMD  OXYGEN Inhale 2 L into the lungs continuous.    [provider]  pantoprazole (PROTONIX) 40 MG tablet Take 40 mg by mouth daily.  05/16/19   [provider]  potassium chloride SA (KLOR-CON) 20 MEQ tablet potassium chloride ER 20 mEq tablet,extended release(part/cryst)    [provider]  Prenatal Vit-Fe Fumarate-FA (PRENATAL MULTIVITAMIN) TABS tablet Take 1 tablet by mouth daily.    [provider]  prochlorperazine (COMPAZINE) 10 MG tablet Take 1 tablet (10 mg total) by mouth every 6 (six) hours as needed (Nausea or vomiting). 03/01/20   FiLloyd HugerMD  simvastatin (ZOCOR) 40 MG tablet Take 20 mg by mouth at bedtime.     [provider]  spironolactone (ALDACTONE) 25 MG tablet spironolactone 25 mg tablet    [provider]  sucralfate (CARAFATE) 1 g tablet Take 1 tablet by mouth 3 (three) times daily before meals. 09/15/19 09/14/20  [provider]  torsemide (DEMADEX) 20 MG tablet Take 2 tablets (40 mg total) by mouth daily. Future refills need to come from  kidney doctor 02/21/20   HaAlisa GraffFNP  traZODone (DESYREL) 50 MG tablet Take 50 mg by mouth at bedtime.    [provider]   DG Chest 1 View  Result Date: 03/28/2020 CLINICAL DATA:  Fall.  Landed on her RIGHT side.  RIGHT hip pain. EXAM: CHEST  1 VIEW COMPARISON:  03/15/2020 FINDINGS: Heart is enlarged, stable in configuration. LEFT-sided AICD is unchanged. No focal consolidations or pleural effusions. No pulmonary edema. Atherosclerotic calcification  of the thoracic aorta. IMPRESSION: Stable cardiomegaly. Electronically Signed   By: Nolon Nations M.D.   On: 03/28/2020 15:38   CT Head Wo Contrast  Result Date: 03/28/2020 CLINICAL DATA:  Trauma. EXAM: CT HEAD WITHOUT CONTRAST CT CERVICAL SPINE WITHOUT CONTRAST TECHNIQUE: Multidetector CT imaging of the head and cervical spine was performed following the standard protocol without intravenous contrast. Multiplanar CT image reconstructions of the cervical spine were also generated. COMPARISON:  None. FINDINGS: CT HEAD FINDINGS Brain: No evidence of acute large vascular territory infarction, hemorrhage, hydrocephalus, extra-axial collection or mass lesion/mass effect. Remote right frontal infarct with encephalomalacia. Patchy white matter hypoattenuation, compatible with chronic microvascular ischemic disease. Generalized cerebral atrophy. Vascular: Calcific atherosclerosis. Skull: No acute fracture Sinuses/Orbits: Visualized sinuses are clear. Unremarkable visualized orbits. Other: No mastoid effusions. CT CERVICAL SPINE FINDINGS Alignment: Mild reversal of the normal cervical lordosis, likely positional. Mild anterolisthesis of C3 on C4, likely degenerative given facet arthropathy at this level. Otherwise, no substantial subluxation. Skull base and vertebrae: No acute fracture. Vertebral body heights are maintained. Soft tissues and spinal canal: No prevertebral fluid or swelling. No visible canal hematoma. Disc levels: Mild multilevel  degenerative disc disease. Multilevel facet arthropathy, greatest at C3-C4. Upper chest: Emphysema. Other: Calcific atherosclerosis of the carotids. IMPRESSION: CT head: 1. No evidence of acute intracranial abnormality. 2. Remote right frontal infarct with encephalomalacia. 3. Chronic microvascular ischemic disease. CT cervical spine: No evidence of acute fracture or traumatic malalignment. Electronically Signed   By: Margaretha Sheffield MD   On: 03/28/2020 15:45   CT Cervical Spine Wo Contrast  Result Date: 03/28/2020 CLINICAL DATA:  Trauma. EXAM: CT HEAD WITHOUT CONTRAST CT CERVICAL SPINE WITHOUT CONTRAST TECHNIQUE: Multidetector CT imaging of the head and cervical spine was performed following the standard protocol without intravenous contrast. Multiplanar CT image reconstructions of the cervical spine were also generated. COMPARISON:  None. FINDINGS: CT HEAD FINDINGS Brain: No evidence of acute large vascular territory infarction, hemorrhage, hydrocephalus, extra-axial collection or mass lesion/mass effect. Remote right frontal infarct with encephalomalacia. Patchy white matter hypoattenuation, compatible with chronic microvascular ischemic disease. Generalized cerebral atrophy. Vascular: Calcific atherosclerosis. Skull: No acute fracture Sinuses/Orbits: Visualized sinuses are clear. Unremarkable visualized orbits. Other: No mastoid effusions. CT CERVICAL SPINE FINDINGS Alignment: Mild reversal of the normal cervical lordosis, likely positional. Mild anterolisthesis of C3 on C4, likely degenerative given facet arthropathy at this level. Otherwise, no substantial subluxation. Skull base and vertebrae: No acute fracture. Vertebral body heights are maintained. Soft tissues and spinal canal: No prevertebral fluid or swelling. No visible canal hematoma. Disc levels: Mild multilevel degenerative disc disease. Multilevel facet arthropathy, greatest at C3-C4. Upper chest: Emphysema. Other: Calcific atherosclerosis of  the carotids. IMPRESSION: CT head: 1. No evidence of acute intracranial abnormality. 2. Remote right frontal infarct with encephalomalacia. 3. Chronic microvascular ischemic disease. CT cervical spine: No evidence of acute fracture or traumatic malalignment. Electronically Signed   By: Margaretha Sheffield MD   On: 03/28/2020 15:45   DG Hip Unilat W or Wo Pelvis 2-3 Views Right  Result Date: 03/28/2020 CLINICAL DATA:  Fall EXAM: DG HIP (WITH OR WITHOUT PELVIS) 2-3V RIGHT COMPARISON:  None. FINDINGS: There is a comminuted impacted minimally displaced fracture of the right transcervical femoral neck involving the greater trochanter. The femoral head is still well seated within the acetabulum. Overlying soft tissue swelling is noted. IMPRESSION: Comminuted mildly impacted right transcervical femoral neck fracture involving the greater trochanter. Electronically Signed   By: Ebony Cargo.D.  On: 03/28/2020 15:38    Positive ROS: All other systems have been reviewed and were otherwise negative with the exception of those mentioned in the HPI and as above.  Physical Exam: General:  Alert, no acute distress Psychiatric:  Patient is competent for consent with normal mood and affect   Cardiovascular:  No pedal edema Respiratory:  No wheezing, non-labored breathing GI:  Abdomen is soft and non-tender Skin:  No lesions in the area of chief complaint Neurologic:  Sensation intact distally Lymphatic:  No axillary or cervical lymphadenopathy  Orthopedic Exam:  Orthopedic examination is limited to the right hip and lower extremity.  The right lower extremity somewhat shortened and externally rotated as compared to the left.  Skin inspection around the right hip is unremarkable.  No swelling, erythema, ecchymosis, abrasions, or other skin abnormalities are identified.  She has mild tenderness to palpation over the lateral aspect of the right hip.  She has more severe pain with any attempted active or passive  motion of the right hip.  She is neurovascularly intact to the right lower extremity and foot, demonstrating the ability to active dorsiflex and plantarflex her toes and ankle.  Sensations intact light touch to all distributions.  She has good capillary refill to her right foot.  X-rays:  X-rays of the pelvis and right hip are available for review and have been reviewed by myself.  These films demonstrate a varus displaced right femoral neck fracture with an associated nondisplaced transverse fracture through the greater trochanter.  No significant degenerative changes of the hip joint are noted.  No lytic lesions or other acute bony abnormalities are identified.  Assessment: 1.  Displaced right femoral neck fracture. 2.  Nondisplaced right greater trochanteric fracture.  Plan: The treatment options have been discussed with the patient, including both surgical and nonsurgical choices. The patient would like to proceed with surgical intervention to include a right hip hemiarthroplasty. This procedure has been discussed in detail, as have the potential risks (including bleeding, infection, nerve and/or blood vessel injury, persistent or recurrent pain, loosening or failure of the components, leg length inequality, dislocation, need for further surgery, blood clots, strokes, heart attacks or arrhythmias, pneumonia, etc.) and benefits. The patient states his/her understanding and agrees to proceed. She agrees to a blood transfusion if necessary. A formal written consent will be obtained by the nursing staff.  Thank you for asking me to participate in the care of this most pleasant yet unfortunate woman.  I will be happy to follow her with you.   Pascal Lux, MD  Beeper #:  [phone]  03/28/2020 5:16 PM

## 2020-03-28 NOTE — ED Triage Notes (Signed)
Pt reports "I must have got up too fast." She states she stood up, felt dizzy, then fell to the ground landing on her right side. C/o right hip pain. Unkown LOC; not witnessed although family was in the home in another room

## 2020-03-28 NOTE — ED Notes (Signed)
RN spoke with Legrand Como from the blood bank; ok'd to use white registration sticker on white ID sheet to be sent with blood bank specimen (sunquest not working at this time).

## 2020-03-28 NOTE — ED Provider Notes (Signed)
[facility] Emergency Department Provider Note  ____________________________________________   First MD Initiated Contact with Patient 03/28/20 1431     (approximate)  I have reviewed the triage vital signs and the nursing notes.   HISTORY  Chief Complaint Fall and Leg Pain   HPI Heather ABRAHA is a 79 y.o. female with a past medical history of COPD on 2 L as needed, DM, HTN, CAD, thyroid disease, depression, CHF, anxiety, and multiple myeloma currently undergoing chemo who presents via EMS from home after a ground-level fall onto the right hip.  Patient states she felt lightheaded and passed out falling to her right hip.  She is adamant that she did not hit her head states she only hit her right hip.  She denies any other acute pain including her left lower extremity, right knee, right ankle, upper extremities, chest, back, head, neck, or face.  She states otherwise the last couple days she has been usual state of health without any other recent falls or injuries, headache, chest pain, shortness of breath, cough, vomiting, dysuria, rash, or other acute sick symptoms.  She does endorse chronic diarrhea which has not changed lately and denies any blood in the stool or urine.         Past Medical History:  Diagnosis Date  . Anxiety   . Chronic combined systolic and diastolic CHF (congestive heart failure) ([place])   . Chronic kidney disease   . Coronary artery disease   . Depression   . Diabetes mellitus without complication ([place])   . Diabetes mellitus, type II ([place])   . Hypertension   . MI (myocardial infarction) ([place])    x 5  . Pacemaker   . Prolonged Q-T interval on ECG   . Thyroid disease     Patient Active Problem List   Diagnosis Date Noted  . Multiple myeloma ([place]) 03/01/2020  . Goals of care, counseling/discussion 03/01/2020  . Acute blood loss anemia   . Gastrointestinal hemorrhage   . UGI bleed 09/04/2019  . Dehydration   . Anemia in  chronic kidney disease (CKD) 07/04/2019  . CKD (chronic kidney disease), stage III ([place]) 07/04/2019  . Chronic respiratory failure with hypoxia ([place]) 07/03/2019  . Hyponatremia 07/03/2019  . Prolonged Q-T interval on ECG   . Weakness   . Anemia   . MDD (major depressive disorder), recurrent episode, mild ([place]) 12/14/2018  . Tobacco use disorder 12/14/2018  . At risk for prolonged QT interval syndrome 12/14/2018  . 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 Pedal edema 02/19/2018  . SOB (shortness of breath) on exertion 02/19/2018  . Left main coronary artery disease 03/07/2017  . 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[place]) 03/06/2017  . Ventricular tachycardia ([place]) 03/05/2017  . Defibrillator discharge 03/04/2017  . CHF (congestive heart failure), NYHA class III, chronic, combined ([place]) 03/04/2017  . COPD (chronic obstructive pulmonary disease) ([place]) 02/10/2017  . Diabetes mellitus type 2, uncomplicated ([place]) 93/81/8299  . H/O ventricular fibrillation 02/10/2017  . Hyperlipidemia 02/10/2017  . 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 Ischemic cardiomyopathy 02/10/2017  . Myocardial infarction ([place]) 02/10/2017  . Moderate mitral regurgitation 02/10/2017  . Syncope and collapse 02/04/2017  . CAD (coronary artery disease) 02/04/2017  . HTN (hypertension) 02/04/2017  . Diabetes ([place]) 02/04/2017    Past Surgical History:  Procedure Laterality Date  . CENTRAL LINE INSERTION  03/11/2017   Procedure: CENTRAL LINE INSERTION;  Surgeon: Leonie Man, MD;  Location: [place] [facility];  Service: Cardiovascular;;  . CHOLECYSTECTOMY    . COLONOSCOPY WITH PROPOFOL N/A 09/01/2019   Procedure: COLONOSCOPY WITH PROPOFOL;  Surgeon: Toledo, Benay Pike, MD;  Location: [facility] ENDOSCOPY;  Service: Gastroenterology;  Laterality: N/A;  . CORONARY STENT INTERVENTION W/IMPELLA N/A 03/11/2017   Procedure: Coronary Stent Intervention w/Impella;  Surgeon: Leonie Man,  MD;  Location: [place] [facility];  Service: Cardiovascular;  Laterality: N/A;  . ESOPHAGOGASTRODUODENOSCOPY (EGD) WITH PROPOFOL N/A 09/01/2019   Procedure: ESOPHAGOGASTRODUODENOSCOPY (EGD) WITH PROPOFOL;  Surgeon: Toledo, Benay Pike, MD;  Location: [facility] ENDOSCOPY;  Service: Gastroenterology;  Laterality: N/A;  . ESOPHAGOGASTRODUODENOSCOPY (EGD) WITH PROPOFOL N/A 09/08/2019   Procedure: ESOPHAGOGASTRODUODENOSCOPY (EGD) WITH PROPOFOL;  Surgeon: [name], MD;  Location: [facility] ENDOSCOPY;  Service: Gastroenterology;  Laterality: N/A;  . EYE SURGERY    . INTRAVASCULAR PRESSURE WIRE/FFR STUDY N/A 03/11/2017   Procedure: INTRAVASCULAR PRESSURE WIRE/FFR STUDY;  Surgeon: Leonie Man, MD;  Location: [place] [facility] LAB;  Service: Cardiovascular;  Laterality: N/A;  . INTRAVASCULAR ULTRASOUND/IVUS N/A 03/11/2017   Procedure: Intravascular Ultrasound/IVUS;  Surgeon: Leonie Man, MD;  Location: [place] [facility];  Service: Cardiovascular;  Laterality: N/A;  . LEFT HEART CATH AND CORONARY ANGIOGRAPHY N/A 03/05/2017   Procedure: LEFT HEART CATH AND CORONARY ANGIOGRAPHY;  Surgeon: Isaias Cowman, MD;  Location: [place] [facility];  Service: Cardiovascular;  Laterality: N/A;  . PACEMAKER IMPLANT    . pacemaker/defibrillator Left     Prior to Admission medications   Medication Sig Start Date End Date Taking? Authorizing Provider  allopurinol (ZYLOPRIM) 100 MG tablet Take 50 mg by mouth daily.    [provider]  aspirin EC 81 MG tablet Take 81 mg by mouth at bedtime.     [provider]  chlorpheniramine-HYDROcodone (TUSSIONEX) 10-8 MG/5ML SUER Take 5 mLs by mouth every 12 (twelve) hours as needed for cough. 03/14/20   Jacquelin Hawking, NP  colchicine 0.6 MG tablet Take 0.6 mg by mouth 2 (two) times daily. 10/08/19   [provider]  dexamethasone (DECADRON) 4 MG tablet Take 5 tablets (20 mg total) by mouth daily. Take the day after Velcade on days 2,5,9,12. Take with  breakfast 03/01/20   Lloyd Huger, MD  diphenoxylate-atropine (LOMOTIL) 2.5-0.025 MG tablet Take 1 tablet by mouth 4 (four) times daily as needed for diarrhea or loose stools. 03/20/20   Lloyd Huger, MD  fexofenadine (ALLEGRA) 180 MG tablet Take 180 mg by mouth daily.    [provider]  FLUoxetine (PROZAC) 10 MG capsule Take 10 mg by mouth daily. 06/29/19   [provider]  folic acid (FOLVITE) 1 MG tablet folic acid 1 mg tablet    [provider]  furosemide (LASIX) 20 MG tablet furosemide 20 mg tablet  TAKE 1 TABLET BY MOUTH EVERY DAY AS NEEDED FOR FLUID OR EDEMA. FOR LEG SWELLING IF YOUR WEIGHT GOES OVER 5 LBS FROM BASELINE    [provider]  lenalidomide (REVLIMID) 10 MG capsule Take 1 capsule (10 mg total) by mouth daily. Take 14 days on, 7 days off, repeat every 21 days 03/20/20   Lloyd Huger, MD  levothyroxine (SYNTHROID) 50 MCG tablet Take 50 mcg by mouth See admin instructions. Take 1 tablet (72mg) by mouth every Tuesday, Thursday, Saturday and Sunday before breakfast    [provider]  levothyroxine (SYNTHROID, LEVOTHROID) 75 MCG tablet Take 75 mcg by mouth See admin instructions. Take 1 tablet (716m) by mouth every Monday, Wednesday and Friday  morning before breakfast    [provider]  magnesium oxide (MAG-OX) 400 MG tablet Take 200 mg by mouth daily.    [provider]  mexiletine (MEXITIL) 200 MG capsule Take 1 capsule (200 mg total) by mouth every 12 (twelve) hours. 03/14/17   Chanetta Marshall K, NP  midodrine (PROAMATINE) 5 MG tablet Take 5 mg by mouth daily as needed.     [provider]  ondansetron (ZOFRAN) 8 MG tablet Take 1 tablet (8 mg total) by mouth 2 (two) times daily as needed (Nausea or vomiting). 03/01/20   Lloyd Huger, MD  OXYGEN Inhale 2 L into the lungs continuous.    [provider]  pantoprazole (PROTONIX) 40 MG tablet Take 40 mg by mouth daily.  05/16/19    [provider]  potassium chloride SA (KLOR-CON) 20 MEQ tablet potassium chloride ER 20 mEq tablet,extended release(part/cryst)    [provider]  predniSONE (DELTASONE) 20 MG tablet Take 20 mg by mouth 2 (two) times daily. 10/08/19   [provider]  Prenatal Vit-Fe Fumarate-FA (PRENATAL MULTIVITAMIN) TABS tablet Take 1 tablet by mouth daily.    [provider]  prochlorperazine (COMPAZINE) 10 MG tablet Take 1 tablet (10 mg total) by mouth every 6 (six) hours as needed (Nausea or vomiting). 03/01/20   Lloyd Huger, MD  simvastatin (ZOCOR) 40 MG tablet Take 20 mg by mouth at bedtime.     [provider]  spironolactone (ALDACTONE) 25 MG tablet spironolactone 25 mg tablet    [provider]  sucralfate (CARAFATE) 1 g tablet Take 1 tablet by mouth 3 (three) times daily before meals. 09/15/19 09/14/20  [provider]  torsemide (DEMADEX) 20 MG tablet Take 2 tablets (40 mg total) by mouth daily. Future refills need to come from kidney doctor 02/21/20   Alisa Graff, FNP  traZODone (DESYREL) 50 MG tablet Take 50 mg by mouth at bedtime.    [provider]    Allergies Celebrex [celecoxib], Glipizide, Lisinopril, Sulfa antibiotics, Levaquin [levofloxacin in d5w], Levofloxacin, Metformin, and Penicillins  Family History  Problem Relation Age of Onset  . Hypertension Father   . Heart attack Father   . Depression Sister   . Depression Brother   . Depression Brother     Social History Social History   Tobacco Use  . Smoking status: Current Every Day Smoker    Packs/day: 0.25    Types: E-cigarettes, Cigarettes  . Smokeless tobacco: Never Used  Vaping Use  . Vaping Use: Former  Substance Use Topics  . Alcohol use: Not Currently    Comment: occasionally  . Drug use: No    Review of Systems  Review of Systems  Constitutional: Negative for chills and fever.  HENT: Negative for sore throat.   Eyes: Negative  for pain.  Respiratory: Negative for cough and stridor.   Cardiovascular: Negative for chest pain.  Gastrointestinal: Negative for vomiting.  Musculoskeletal: Positive for joint pain ( R hip) and myalgias ( R hip).  Skin: Negative for rash.  Neurological: Positive for loss of consciousness. Negative for seizures and headaches.  Psychiatric/Behavioral: Negative for suicidal ideas.  All other systems reviewed and are negative.     ____________________________________________   PHYSICAL EXAM:  VITAL SIGNS: ED Triage Vitals  Enc Vitals Group     BP      Pulse      Resp      Temp      Temp src  SpO2      Weight      Height      Head Circumference      Peak Flow      Pain Score      Pain Loc      Pain Edu?      Excl. in [place]?    Vitals:   03/28/20 1534  BP: (!) 89/52  Pulse: 73  Resp: (!) 8  SpO2: 100%   Physical Exam Vitals and nursing note reviewed.  Constitutional:      General: She is not in acute distress.    Appearance: She is well-developed.  HENT:     Head: Normocephalic and atraumatic.     Right Ear: External ear normal.     Left Ear: External ear normal.     Nose: Nose normal.     Mouth/Throat:     Mouth: Mucous membranes are dry.  Eyes:     Conjunctiva/sclera: Conjunctivae normal.  Cardiovascular:     Rate and Rhythm: Normal rate and regular rhythm.     Heart sounds: No murmur heard.   Pulmonary:     Effort: Pulmonary effort is normal. No respiratory distress.     Breath sounds: Normal breath sounds.  Abdominal:     Palpations: Abdomen is soft.     Tenderness: There is no abdominal tenderness.  Musculoskeletal:     Cervical back: Neck supple.     Right hip: Tenderness present. Decreased range of motion. Decreased strength.     Left hip: No tenderness. Normal range of motion. Normal strength.  Skin:    General: Skin is warm and dry.     Capillary Refill: Capillary refill takes 2 to 3 seconds.  Neurological:     Mental Status: She is  alert and oriented to person, place, and time.  Psychiatric:        Mood and Affect: Mood normal.     PERRLA.  EOMI.  Cranial nerves II 12 grossly intact.  Patient has symmetric strength on her bilateral upper extremities and throughout her left lower extremity.  She is able to move her toes on her right lower extremity on command.  Pupils bilateral radial and DP pulses.  Sensation intact light touch throughout all extremities.  There is no step-off tenderness or deformities over the C/T/L-spine.  Patient is a small skin tear of her left elbow but otherwise has full range of motion and strength and there are no other skin tears or effusions deformities or focal areas of tenderness over the bilateral shoulders, elbows, wrists, knees, or ankles. ____________________________________________   LABS (all labs ordered are listed, but only abnormal results are displayed)  Labs Reviewed  CBC WITH DIFFERENTIAL/PLATELET - Abnormal; Notable for the following components:      Result Value   RBC 2.70 (*)    Hemoglobin 8.0 (*)    HCT 25.7 (*)    RDW 17.2 (*)    Platelets 95 (*)    All other components within normal limits  COMPREHENSIVE METABOLIC PANEL - Abnormal; Notable for the following components:   Potassium 2.9 (*)    Chloride 94 (*)    BUN 42 (*)    Creatinine, Ser 1.36 (*)    Calcium 7.5 (*)    Total Protein 5.6 (*)    Albumin 3.3 (*)    GFR, Estimated 40 (*)    All other components within normal limits  BRAIN NATRIURETIC PEPTIDE - Abnormal; Notable for the following components:  B Natriuretic Peptide 318.3 (*)    All other components within normal limits  MAGNESIUM - Abnormal; Notable for the following components:   Magnesium 2.6 (*)    All other components within normal limits  TROPONIN I (HIGH SENSITIVITY) - Abnormal; Notable for the following components:   Troponin I (High Sensitivity) 34 (*)    All other components within normal limits  RESPIRATORY PANEL BY RT PCR (FLU A&B,  COVID)  PROTIME-INR   ____________________________________________  EKG  Sinus rhythm with a rate of 75, normal axis, prolonged QTc interval at 550 and nonspecific changes in anterior leads and lateral leads without other clearance of acute ischemia.  Incomplete right bundle branch block. ____________________________________________  RADIOLOGY  ED MD interpretation: Chest x-ray shows no evidence of pneumothorax, rib fracture, effusion, focal consolidation, overt edema.  CT head shows no evidence of skull-year-old skull fracture or intracranial hemorrhage.  X-ray of the right hip shows proximal comminuted femur fracture.  Official radiology report(s): DG Chest 1 View  Result Date: 03/28/2020 CLINICAL DATA:  Fall.  Landed on her RIGHT side.  RIGHT hip pain. EXAM: CHEST  1 VIEW COMPARISON:  03/15/2020 FINDINGS: Heart is enlarged, stable in configuration. LEFT-sided AICD is unchanged. No focal consolidations or pleural effusions. No pulmonary edema. Atherosclerotic calcification of the thoracic aorta. IMPRESSION: Stable cardiomegaly. Electronically Signed   By: Nolon Nations M.D.   On: 03/28/2020 15:38   CT Head Wo Contrast  Result Date: 03/28/2020 CLINICAL DATA:  Trauma. EXAM: CT HEAD WITHOUT CONTRAST CT CERVICAL SPINE WITHOUT CONTRAST TECHNIQUE: Multidetector CT imaging of the head and cervical spine was performed following the standard protocol without intravenous contrast. Multiplanar CT image reconstructions of the cervical spine were also generated. COMPARISON:  None. FINDINGS: CT HEAD FINDINGS Brain: No evidence of acute large vascular territory infarction, hemorrhage, hydrocephalus, extra-axial collection or mass lesion/mass effect. Remote right frontal infarct with encephalomalacia. Patchy white matter hypoattenuation, compatible with chronic microvascular ischemic disease. Generalized cerebral atrophy. Vascular: Calcific atherosclerosis. Skull: No acute fracture Sinuses/Orbits:  Visualized sinuses are clear. Unremarkable visualized orbits. Other: No mastoid effusions. CT CERVICAL SPINE FINDINGS Alignment: Mild reversal of the normal cervical lordosis, likely positional. Mild anterolisthesis of C3 on C4, likely degenerative given facet arthropathy at this level. Otherwise, no substantial subluxation. Skull base and vertebrae: No acute fracture. Vertebral body heights are maintained. Soft tissues and spinal canal: No prevertebral fluid or swelling. No visible canal hematoma. Disc levels: Mild multilevel degenerative disc disease. Multilevel facet arthropathy, greatest at C3-C4. Upper chest: Emphysema. Other: Calcific atherosclerosis of the carotids. IMPRESSION: CT head: 1. No evidence of acute intracranial abnormality. 2. Remote right frontal infarct with encephalomalacia. 3. Chronic microvascular ischemic disease. CT cervical spine: No evidence of acute fracture or traumatic malalignment. Electronically Signed   By: Margaretha Sheffield MD   On: 03/28/2020 15:45   CT Cervical Spine Wo Contrast  Result Date: 03/28/2020 CLINICAL DATA:  Trauma. EXAM: CT HEAD WITHOUT CONTRAST CT CERVICAL SPINE WITHOUT CONTRAST TECHNIQUE: Multidetector CT imaging of the head and cervical spine was performed following the standard protocol without intravenous contrast. Multiplanar CT image reconstructions of the cervical spine were also generated. COMPARISON:  None. FINDINGS: CT HEAD FINDINGS Brain: No evidence of acute large vascular territory infarction, hemorrhage, hydrocephalus, extra-axial collection or mass lesion/mass effect. Remote right frontal infarct with encephalomalacia. Patchy white matter hypoattenuation, compatible with chronic microvascular ischemic disease. Generalized cerebral atrophy. Vascular: Calcific atherosclerosis. Skull: No acute fracture Sinuses/Orbits: Visualized sinuses are clear. Unremarkable visualized orbits. Other: No  mastoid effusions. CT CERVICAL SPINE FINDINGS Alignment: Mild  reversal of the normal cervical lordosis, likely positional. Mild anterolisthesis of C3 on C4, likely degenerative given facet arthropathy at this level. Otherwise, no substantial subluxation. Skull base and vertebrae: No acute fracture. Vertebral body heights are maintained. Soft tissues and spinal canal: No prevertebral fluid or swelling. No visible canal hematoma. Disc levels: Mild multilevel degenerative disc disease. Multilevel facet arthropathy, greatest at C3-C4. Upper chest: Emphysema. Other: Calcific atherosclerosis of the carotids. IMPRESSION: CT head: 1. No evidence of acute intracranial abnormality. 2. Remote right frontal infarct with encephalomalacia. 3. Chronic microvascular ischemic disease. CT cervical spine: No evidence of acute fracture or traumatic malalignment. Electronically Signed   By: Margaretha Sheffield MD   On: 03/28/2020 15:45   DG Hip Unilat W or Wo Pelvis 2-3 Views Right  Result Date: 03/28/2020 CLINICAL DATA:  Fall EXAM: DG HIP (WITH OR WITHOUT PELVIS) 2-3V RIGHT COMPARISON:  None. FINDINGS: There is a comminuted impacted minimally displaced fracture of the right transcervical femoral neck involving the greater trochanter. The femoral head is still well seated within the acetabulum. Overlying soft tissue swelling is noted. IMPRESSION: Comminuted mildly impacted right transcervical femoral neck fracture involving the greater trochanter. Electronically Signed   By: Prudencio Pair M.D.   On: 03/28/2020 15:38    ____________________________________________   PROCEDURES  Procedure(s) performed (including Critical Care):  .1-3 Lead EKG Interpretation Performed by: Lucrezia Starch, MD Authorized by: Lucrezia Starch, MD     Interpretation: normal     ECG rate assessment: normal     Rhythm: sinus rhythm     Ectopy: none     Conduction: normal       ____________________________________________   INITIAL IMPRESSION / ASSESSMENT AND PLAN / ED COURSE        Patient  presents with left to history exam for assessment of apparent syncopal episode resulting in a fall onto the right hip.  On arrival patient is borderline hypotensive with a BP of 89/52 with otherwise stable vital signs on room air.  Exam as above remarkable decrease strength range of motion some tenderness of the right hip and a small skin tear over the left elbow with no other evidence of trauma.  Patient is otherwise neurovascular intact in all extremities.  With regard to etiology of patient's syncope differential includes but is not limited to acute anemia, arrhythmia, ACS, PE, CHF, and dehydration.  ECG is sinus and troponin slightly elevated.  Troponin is slightly elevated which I suspect is some mild demand ischemia secondary to dehydration.  However patient denies any chest pain shortness of breath or thoracic symptoms and she is not hypoxic or tachypneic and very low suspicion for ACS or PE at this time.   She was given 500 cc of LR while in the emergency room.  While BNP is slightly elevated it is below patient's baseline and she does not appear volume overloaded on exam or chest x-ray.  Patient is known to be anemic on her CBC with a hemoglobin of 8 Lissa Merlin is on appear acute as she had a hemoglobin of 8.3 at 8 2 weeks ago reports prospectively.  Trauma work-up remarkable for small skin tear of the left elbow that does not require repair at this time as well as the right proximal femur fracture as noted above plain film which is neurovascular intact distally.  Will suspicion for occult visceral injury.  CT head and C-spine showed no evidence of acute fracture or  dislocation or intracranial bleeding.  This was obtained due to patient's age and thrombocytopenia.  I did reach out to orthopedic service Dr. Roland Rack is in the operating room related that I would consult him to the OR nurse who relayed back to me that he would see the patient.  I will plan to admit to hospitalist service for further  evaluation management of concern for higher syncope and right hip fracture.  ____________________________________________   FINAL CLINICAL IMPRESSION(S) / ED DIAGNOSES  Final diagnoses:  Hypokalemia  Closed fracture of right hip, initial encounter ([place])  Fall, initial encounter  Syncope, unspecified syncope type  Troponin I above reference range    Medications  potassium chloride 10 mEq in 100 mL IVPB (has no administration in time range)  lactated ringers bolus 500 mL (has no administration in time range)  fentaNYL (SUBLIMAZE) injection 25 mcg (has no administration in time range)  acetaminophen (TYLENOL) tablet 1,000 mg (1,000 mg Oral Given 03/28/20 1444)  fentaNYL (SUBLIMAZE) injection 25 mcg (25 mcg Intravenous Given 03/28/20 1445)     ED Discharge Orders    None       Note:  This document was prepared using Dragon voice recognition software and may include unintentional dictation errors.   Lucrezia Starch, MD 03/28/20 [phone]

## 2020-03-28 NOTE — Consult Note (Addendum)
[facility] Cardiology  CARDIOLOGY CONSULT NOTE  Patient ID: Heather Peterson MRN: [id number] DOB/AGE: [birth year] 79 y.o.  Admit date: 03/28/2020 Referring Physician [place] Primary Physician [place] Primary Cardiologist Rye Decoste Reason for Consultation preoperative cardiovascular evaluation  HPI: 79 year old female referred for preoperative cardiovascular evaluation prior to right hip surgery.  The patient presented to [facility] emergency room for falling and x-ray revealing right transcervical femoral neck fracture involving the greater trochanter.  Patient recently diagnosed with multiple myeloma, receiving chemotherapy with low blood pressure and postural dizziness.  In the emergency room, ECG reveals sinus rhythm at 75 bpm with incomplete right bundle branch block.  Admission labs revealed borderline elevated high-sensitivity troponin of 34.  Hemoglobin and hematocrit were 8.0 and 25.7, respectively.   The patient has known coronary artery disease, status post MI and stent left circumflex 5/99, s/p stent proximal left circumflex 11/05/2011, MI status post DES x 3 proximal LAD 04/28/2014.  Patient has known dilated cardiomyopathy, status post ICD 04/2014 with history of VT/VF and ICD shocks 02/04/2017, 03/04/2017.  Patient  on mexiletine for ventricular tachycardia prevention.  She currently denies chest pain, shortness of breath, palpitations, or heart racing.  2D echocardiogram 04/15/2019 revealed LVEF 35 to 40%.  ICD interrogation 05/19/2019 revealed normal ICD function with underlying sinus rhythm at 73 bpm without recent delivered shocks.  Review of systems complete and found to be negative unless listed above     Past Medical History:  Diagnosis Date  . Anxiety   . Chronic combined systolic and diastolic CHF (congestive heart failure) ([place])   . Chronic kidney disease   . Coronary artery disease   . Depression   . Diabetes mellitus without complication ([place])   . Diabetes mellitus, type II ([place])    . Hypertension   . MI (myocardial infarction) ([place])    x 5  . Pacemaker   . Prolonged Q-T interval on ECG   . Thyroid disease     Past Surgical History:  Procedure Laterality Date  . CENTRAL LINE INSERTION  03/11/2017   Procedure: CENTRAL LINE INSERTION;  Surgeon: Leonie Man, MD;  Location: [place] [facility];  Service: Cardiovascular;;  . CHOLECYSTECTOMY    . COLONOSCOPY WITH PROPOFOL N/A 09/01/2019   Procedure: COLONOSCOPY WITH PROPOFOL;  Surgeon: Toledo, Benay Pike, MD;  Location: [facility] ENDOSCOPY;  Service: Gastroenterology;  Laterality: N/A;  . CORONARY STENT INTERVENTION W/IMPELLA N/A 03/11/2017   Procedure: Coronary Stent Intervention w/Impella;  Surgeon: Leonie Man, MD;  Location: [place] [facility];  Service: Cardiovascular;  Laterality: N/A;  . ESOPHAGOGASTRODUODENOSCOPY (EGD) WITH PROPOFOL N/A 09/01/2019   Procedure: ESOPHAGOGASTRODUODENOSCOPY (EGD) WITH PROPOFOL;  Surgeon: Toledo, Benay Pike, MD;  Location: [facility] ENDOSCOPY;  Service: Gastroenterology;  Laterality: N/A;  . ESOPHAGOGASTRODUODENOSCOPY (EGD) WITH PROPOFOL N/A 09/08/2019   Procedure: ESOPHAGOGASTRODUODENOSCOPY (EGD) WITH PROPOFOL;  Surgeon: [name], MD;  Location: [facility] END[facility]COPY;  Service: Gastroenterology;  Laterality: N/A;  . EYE SURGERY    . INTRAVASCULAR PRESSURE WIRE/FFR STUDY N/A 03/11/2017   Procedure: INTRAVASCULAR PRESSURE WIRE/FFR STUDY;  Surgeon: Leonie Man, MD;  Location: [place] CV LAB;  Service: Cardiovascular;  Laterality: N/A;  . INTRAVASCULAR ULTRASOUND/IVUS N/A 03/11/2017   Procedure: Intravascular Ultrasound/IVUS;  Surgeon: Leonie Man, MD;  Location: [place] [facility] LAB;  Service: Cardiovascular;  Laterality: N/A;  . LEFT HEART CATH AND CORONARY ANGIOGRAPHY N/A 03/05/2017   Procedure: LEFT HEART CATH AND CORONARY ANGIOGRAPHY;  Surgeon: Isaias Cowman, MD;  Location: [place] CV LAB;  Service: Cardiovascular;  Laterality: N/A;  . PACEMAKER IMPLANT    .  pacemaker/defibrillator Left     (Not in a hospital admission)  Social History   Socioeconomic History  . Marital status: Married    Spouse name: rodney  . Number of children: 2  . Years of education: Not on file  . Highest education level: High school graduate  Occupational History  . Not on file  Tobacco Use  . Smoking status: Current Every Day Smoker    Packs/day: 0.25    Types: E-cigarettes, Cigarettes  . Smokeless tobacco: Never Used  Vaping Use  . Vaping Use: Former  Substance and Sexual Activity  . Alcohol use: Not Currently    Comment: occasionally  . Drug use: No  . Sexual activity: Not Currently  Other Topics Concern  . Not on file  Social History Narrative  . Not on file   Social Determinants of Health   Financial Resource Strain:   . Difficulty of Paying Living Expenses: Not on file  Food Insecurity:   . Worried About Charity fundraiser in the Last Year: Not on file  . Ran Out of Food in the Last Year: Not on file  Transportation Needs:   . Lack of Transportation (Medical): Not on file  . Lack of Transportation (Non-Medical): Not on file  Physical Activity:   . Days of Exercise per Week: Not on file  . Minutes of Exercise per Session: Not on file  Stress:   . Feeling of Stress : Not on file  Social Connections:   . Frequency of Communication with Friends and Family: Not on file  . Frequency of Social Gatherings with Friends and Family: Not on file  . Attends Religious Services: Not on file  . Active Member of Clubs or Organizations: Not on file  . Attends Archivist Meetings: Not on file  . Marital Status: Not on file  Intimate Partner Violence:   . Fear of Current or Ex-Partner: Not on file  . Emotionally Abused: Not on file  . Physically Abused: Not on file  . Sexually Abused: Not on file    Family History  Problem Relation Age of Onset  . Hypertension Father   . Heart attack Father   . Depression Sister   . Depression Brother    . Depression Brother       Review of systems complete and found to be negative unless listed above      PHYSICAL EXAM  General: Well developed, well nourished, in no acute distress HEENT:  Normocephalic and atramatic Neck:  No JVD.  Lungs: Clear bilaterally to auscultation and percussion. Heart: HRRR . Normal S1 and S2 without gallops or murmurs.  Abdomen: Bowel sounds are positive, abdomen soft and non-tender  Msk:  Back normal, normal gait. Normal strength and tone for age. Extremities: No clubbing, cyanosis or edema.   Neuro: Alert and oriented X 3. Psych:  Good affect, responds appropriately  Labs:   Lab Results  Component Value Date   WBC 5.7 03/28/2020   HGB 8.0 (L) 03/28/2020   HCT 25.7 (L) 03/28/2020   MCV 95.2 03/28/2020   PLT 95 (L) 03/28/2020    Recent Labs  Lab 03/28/20 1435  NA 136  K 2.9*  CL 94*  CO2 32  BUN 42*  CREATININE 1.36*  CALCIUM 7.5*  PROT 5.6*  BILITOT 0.8  ALKPHOS 67  ALT 25  AST 22  GLUCOSE 95   Lab Results  Component Value  Date   TROPONINI <0.03 09/05/2017   No results found for: CHOL No results found for: HDL No results found for: LDLCALC No results found for: TRIG No results found for: CHOLHDL No results found for: LDLDIRECT    Radiology: DG Chest 1 View  Result Date: 03/28/2020 CLINICAL DATA:  Fall.  Landed on her RIGHT side.  RIGHT hip pain. EXAM: CHEST  1 VIEW COMPARISON:  03/15/2020 FINDINGS: Heart is enlarged, stable in configuration. LEFT-sided AICD is unchanged. No focal consolidations or pleural effusions. No pulmonary edema. Atherosclerotic calcification of the thoracic aorta. IMPRESSION: Stable cardiomegaly. Electronically Signed   By: Nolon Nations M.D.   On: 03/28/2020 15:38   DG Chest 2 View  Result Date: 03/15/2020 CLINICAL DATA:  78 year old female with cough for 2-3 months and drainage. Multiple myeloma. EXAM: CHEST - 2 VIEW COMPARISON:  Chest radiographs 08/27/2019 and earlier. FINDINGS: Chronic  cardiomegaly, left chest AICD. Improved lung volumes, and resolved small pleural effusions since April. No pneumothorax. Stable pulmonary vascularity, no overt edema. No confluent pulmonary opacity. Calcified aortic atherosclerosis. Stable visualized osseous structures. Negative visible bowel gas pattern. IMPRESSION: Cardiomegaly. Resolved pleural effusions since April. No acute cardiopulmonary abnormality. Electronically Signed   By: Genevie Ann M.D.   On: 03/15/2020 12:14   CT Head Wo Contrast  Result Date: 03/28/2020 CLINICAL DATA:  Trauma. EXAM: CT HEAD WITHOUT CONTRAST CT CERVICAL SPINE WITHOUT CONTRAST TECHNIQUE: Multidetector CT imaging of the head and cervical spine was performed following the standard protocol without intravenous contrast. Multiplanar CT image reconstructions of the cervical spine were also generated. COMPARISON:  None. FINDINGS: CT HEAD FINDINGS Brain: No evidence of acute large vascular territory infarction, hemorrhage, hydrocephalus, extra-axial collection or mass lesion/mass effect. Remote right frontal infarct with encephalomalacia. Patchy white matter hypoattenuation, compatible with chronic microvascular ischemic disease. Generalized cerebral atrophy. Vascular: Calcific atherosclerosis. Skull: No acute fracture Sinuses/Orbits: Visualized sinuses are clear. Unremarkable visualized orbits. Other: No mastoid effusions. CT CERVICAL SPINE FINDINGS Alignment: Mild reversal of the normal cervical lordosis, likely positional. Mild anterolisthesis of C3 on C4, likely degenerative given facet arthropathy at this level. Otherwise, no substantial subluxation. Skull base and vertebrae: No acute fracture. Vertebral body heights are maintained. Soft tissues and spinal canal: No prevertebral fluid or swelling. No visible canal hematoma. Disc levels: Mild multilevel degenerative disc disease. Multilevel facet arthropathy, greatest at C3-C4. Upper chest: Emphysema. Other: Calcific atherosclerosis of  the carotids. IMPRESSION: CT head: 1. No evidence of acute intracranial abnormality. 2. Remote right frontal infarct with encephalomalacia. 3. Chronic microvascular ischemic disease. CT cervical spine: No evidence of acute fracture or traumatic malalignment. Electronically Signed   By: Margaretha Sheffield MD   On: 03/28/2020 15:45   CT Cervical Spine Wo Contrast  Result Date: 03/28/2020 CLINICAL DATA:  Trauma. EXAM: CT HEAD WITHOUT CONTRAST CT CERVICAL SPINE WITHOUT CONTRAST TECHNIQUE: Multidetector CT imaging of the head and cervical spine was performed following the standard protocol without intravenous contrast. Multiplanar CT image reconstructions of the cervical spine were also generated. COMPARISON:  None. FINDINGS: CT HEAD FINDINGS Brain: No evidence of acute large vascular territory infarction, hemorrhage, hydrocephalus, extra-axial collection or mass lesion/mass effect. Remote right frontal infarct with encephalomalacia. Patchy white matter hypoattenuation, compatible with chronic microvascular ischemic disease. Generalized cerebral atrophy. Vascular: Calcific atherosclerosis. Skull: No acute fracture Sinuses/Orbits: Visualized sinuses are clear. Unremarkable visualized orbits. Other: No mastoid effusions. CT CERVICAL SPINE FINDINGS Alignment: Mild reversal of the normal cervical lordosis, likely positional. Mild anterolisthesis of C3 on C4, likely degenerative  given facet arthropathy at this level. Otherwise, no substantial subluxation. Skull base and vertebrae: No acute fracture. Vertebral body heights are maintained. Soft tissues and spinal canal: No prevertebral fluid or swelling. No visible canal hematoma. Disc levels: Mild multilevel degenerative disc disease. Multilevel facet arthropathy, greatest at C3-C4. Upper chest: Emphysema. Other: Calcific atherosclerosis of the carotids. IMPRESSION: CT head: 1. No evidence of acute intracranial abnormality. 2. Remote right frontal infarct with  encephalomalacia. 3. Chronic microvascular ischemic disease. CT cervical spine: No evidence of acute fracture or traumatic malalignment. Electronically Signed   By: Margaretha Sheffield MD   On: 03/28/2020 15:45   US Abdomen Complete  Result Date: 03/11/2020 CLINICAL DATA:  Cirrhosis EXAM: ABDOMEN ULTRASOUND COMPLETE COMPARISON:  CT 07/16/2019 FINDINGS: Gallbladder: Prior cholecystectomy Common bile duct: Diameter: Normal caliber, 3 mm Liver: Diffusely increased echotexture and nodular contours. No focal hepatic abnormality. Portal vein is patent on color Doppler imaging with normal direction of blood flow towards the liver. IVC: No abnormality visualized. Pancreas: Visualized portion unremarkable. Spleen: Size and appearance within normal limits. Right Kidney: Length: 8.4 cm. Echogenicity within normal limits. No mass or hydronephrosis visualized. Left Kidney: Length: 8.8 cm. Echogenicity within normal limits. No mass or hydronephrosis visualized. Abdominal aorta: No aneurysm visualized. Other findings: Moderate ascites. IMPRESSION: Changes of hepatic steatosis and cirrhosis. Moderate ascites. No focal hepatic abnormality. Electronically Signed   By: Rolm Baptise M.D.   On: 03/11/2020 20:44   DG Bone Survey Met  Result Date: 03/04/2020 CLINICAL DATA:  Multiple myeloma EXAM: METASTATIC BONE SURVEY COMPARISON:  None. FINDINGS: Metastatic bone survey was performed. Cardiac shadow is enlarged. Defibrillator is noted. The lungs are clear. No definitive rib abnormality is noted. Cervical spine demonstrates multilevel degenerative change. No lytic or sclerotic lesions are seen. The skull demonstrates a single lucency in the parietal bone consistent with the given clinical history. Upper extremities show no significant lytic lesions. Thoracic spine demonstrates vertebral body height to be well maintained. No paraspinal mass or pedicle abnormality is noted. Degenerative changes of lumbar spine are seen. No lytic  lesion is noted. No compression deformities are noted. Scattered lytic lesions are noted within the pubic rami as well as the proximal femurs bilaterally. Distal lower extremities show no definitive lytic or sclerotic lesions. IMPRESSION: Scattered lucencies are noted as described consistent with the given clinical history of myeloma. Electronically Signed   By: Inez Catalina M.D.   On: 03/04/2020 19:59   DG Hip Unilat W or Wo Pelvis 2-3 Views Right  Result Date: 03/28/2020 CLINICAL DATA:  Fall EXAM: DG HIP (WITH OR WITHOUT PELVIS) 2-3V RIGHT COMPARISON:  None. FINDINGS: There is a comminuted impacted minimally displaced fracture of the right transcervical femoral neck involving the greater trochanter. The femoral head is still well seated within the acetabulum. Overlying soft tissue swelling is noted. IMPRESSION: Comminuted mildly impacted right transcervical femoral neck fracture involving the greater trochanter. Electronically Signed   By: Prudencio Pair M.D.   On: 03/28/2020 15:38    EKG: Sinus rhythm at 75 bpm with incomplete right bundle branch block  ASSESSMENT AND PLAN:   1.  Preoperative cardiovascular evaluation prior to hip surgery.  Patient has history of prior MI, multiple coronary stents, dilated cardiomyopathy, status post ICD, with past history of ICD shocks for VT VF, with COPD, multiple myeloma undergoing chemotherapy, with type 2 diabetes, and chronic kidney disease.  The patient currently denies chest pain, appears to be euvolemic, does not appear to be in congestive heart failure,  with borderline elevated high-sensitivity troponin. Thepatient appears to be at moderate risk for serious cardiovascular complication during hip surgery but the patient also appears to be medically optimized, no chest pain, signs of congestive heart failure, or recent ICD shocks. 2.  Coronary artery disease, history of prior MI, status post multiple coronary stents, currently chest pain-free 3.  Dilated  cardiomyopathy, status post ICD 4.  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 5.  COPD, on chronic O2, currently denies shortness of breath at rest 6.  Multiple myeloma, on chemotherapy 7.  Chronic anemia 8.  Chronic kidney disease 9.  Hypotension, asymptomatic, likely multifactorial, with recent diagnosis of multiple myeloma currently receiving chemotherapy 10.  Right hip fracture  Recommendations  1.  Agree with current therapy 2.  Proceed with hip surgery as deemed necessary 3.  Defer further cardiac diagnostics at this time 4.  Continue mexiletine to control VT/VF 5.  Hold diuretics for now  Signed: Isaias Cowman MD,PhD, [facility] 03/28/2020, 5:06 PM

## 2020-03-28 NOTE — ED Notes (Signed)
Attempted to call report to inpt unit; unknown who is the receiving nurse; on hold x5 min with no response. Will attempt again soon

## 2020-03-28 NOTE — ED Notes (Signed)
Patient transported to X-ray 

## 2020-03-29 ENCOUNTER — Inpatient Hospital Stay: Payer: Medicare Other

## 2020-03-29 ENCOUNTER — Encounter
Admission: EM | Disposition: A | Discharge status: Skilled Nursing Facility | Payer: Self-pay | Source: Home / Self Care | Attending: Internal Medicine

## 2020-03-29 ENCOUNTER — Other Ambulatory Visit: Payer: Self-pay

## 2020-03-29 ENCOUNTER — Inpatient Hospital Stay: Payer: Medicare Other | Admitting: Anesthesiology

## 2020-03-29 ENCOUNTER — Encounter: Payer: Self-pay | Admitting: Internal Medicine

## 2020-03-29 DIAGNOSIS — D649 Anemia, unspecified: Secondary | ICD-10-CM

## 2020-03-29 DIAGNOSIS — N189 Chronic kidney disease, unspecified: Secondary | ICD-10-CM

## 2020-03-29 DIAGNOSIS — S7291XA Unspecified fracture of right femur, initial encounter for closed fracture: Secondary | ICD-10-CM

## 2020-03-29 DIAGNOSIS — C9000 Multiple myeloma not having achieved remission: Secondary | ICD-10-CM

## 2020-03-29 HISTORY — PX: HIP ARTHROPLASTY: SHX981

## 2020-03-29 LAB — GLUCOSE, CAPILLARY
Glucose-Capillary: 100 mg/dL — ABNORMAL HIGH (ref 70–99)
Glucose-Capillary: 105 mg/dL — ABNORMAL HIGH (ref 70–99)
Glucose-Capillary: 123 mg/dL — ABNORMAL HIGH (ref 70–99)
Glucose-Capillary: 167 mg/dL — ABNORMAL HIGH (ref 70–99)
Glucose-Capillary: 170 mg/dL — ABNORMAL HIGH (ref 70–99)
Glucose-Capillary: 87 mg/dL (ref 70–99)
Glucose-Capillary: 94 mg/dL (ref 70–99)

## 2020-03-29 LAB — CBC
HCT: 27.4 % — ABNORMAL LOW (ref 36.0–46.0)
Hemoglobin: 8.6 g/dL — ABNORMAL LOW (ref 12.0–15.0)
MCH: 29.7 pg (ref 26.0–34.0)
MCHC: 31.4 g/dL (ref 30.0–36.0)
MCV: 94.5 fL (ref 80.0–100.0)
Platelets: 103 10*3/uL — ABNORMAL LOW (ref 150–400)
RBC: 2.9 MIL/uL — ABNORMAL LOW (ref 3.87–5.11)
RDW: 17.2 % — ABNORMAL HIGH (ref 11.5–15.5)
WBC: 5.8 10*3/uL (ref 4.0–10.5)
nRBC: 0 % (ref 0.0–0.2)

## 2020-03-29 LAB — BASIC METABOLIC PANEL
Anion gap: 9 (ref 5–15)
BUN: 33 mg/dL — ABNORMAL HIGH (ref 8–23)
CO2: 31 mmol/L (ref 22–32)
Calcium: 8 mg/dL — ABNORMAL LOW (ref 8.9–10.3)
Chloride: 97 mmol/L — ABNORMAL LOW (ref 98–111)
Creatinine, Ser: 1.37 mg/dL — ABNORMAL HIGH (ref 0.44–1.00)
GFR, Estimated: 40 mL/min — ABNORMAL LOW (ref >60)
Glucose, Bld: 97 mg/dL (ref 70–99)
Potassium: 4.1 mmol/L (ref 3.5–5.1)
Sodium: 137 mmol/L (ref 135–145)

## 2020-03-29 LAB — HEMOGLOBIN A1C
Hgb A1c MFr Bld: 5.9 % — ABNORMAL HIGH (ref 4.8–5.6)
Mean Plasma Glucose: 122.63 mg/dL

## 2020-03-29 LAB — HEMOGLOBIN AND HEMATOCRIT, BLOOD
HCT: 20.2 % — ABNORMAL LOW (ref 36.0–46.0)
Hemoglobin: 6.2 g/dL — ABNORMAL LOW (ref 12.0–15.0)

## 2020-03-29 LAB — PROTIME-INR
INR: 1.3 — ABNORMAL HIGH (ref 0.8–1.2)
Prothrombin Time: 15.5 seconds — ABNORMAL HIGH (ref 11.4–15.2)

## 2020-03-29 SURGERY — HEMIARTHROPLASTY, HIP, DIRECT ANTERIOR APPROACH, FOR FRACTURE
Anesthesia: Spinal | Site: Hip | Laterality: Right

## 2020-03-29 MED ORDER — DOCUSATE SODIUM 100 MG PO CAPS
100.0000 mg | ORAL_CAPSULE | Freq: Two times a day (BID) | ORAL | Status: DC
  Administered 2020-03-29 – 2020-04-03 (×10): 100 mg via ORAL
  Filled 2020-03-29 (×11): qty 1

## 2020-03-29 MED ORDER — ONDANSETRON HCL 4 MG PO TABS: 4.0000 mg | ORAL_TABLET | Freq: Four times a day (QID) | ORAL | Status: DC | PRN

## 2020-03-29 MED ORDER — ACETAMINOPHEN 325 MG PO TABS
325.0000 mg | ORAL_TABLET | Freq: Four times a day (QID) | ORAL | Status: DC | PRN
  Administered 2020-03-31 – 2020-04-02 (×3): 650 mg via ORAL
  Filled 2020-03-29 (×3): qty 2

## 2020-03-29 MED ORDER — KETAMINE HCL 50 MG/ML IJ SOLN
INTRAMUSCULAR | Status: AC
  Filled 2020-03-29: qty 10

## 2020-03-29 MED ORDER — SODIUM CHLORIDE 0.9 % IV SOLN
INTRAVENOUS | Status: DC | PRN
  Administered 2020-03-29: 50 ug/min via INTRAVENOUS

## 2020-03-29 MED ORDER — ETOMIDATE 2 MG/ML IV SOLN
INTRAVENOUS | Status: AC
  Filled 2020-03-29: qty 10

## 2020-03-29 MED ORDER — BUPIVACAINE-EPINEPHRINE (PF) 0.5% -1:200000 IJ SOLN
INTRAMUSCULAR | Status: DC | PRN
  Administered 2020-03-29: 30 mL via PERINEURAL

## 2020-03-29 MED ORDER — CEFAZOLIN SODIUM-DEXTROSE 2-4 GM/100ML-% IV SOLN
2.0000 g | Freq: Four times a day (QID) | INTRAVENOUS | Status: AC
  Administered 2020-03-30 (×2): 2 g via INTRAVENOUS
  Filled 2020-03-29 (×3): qty 100

## 2020-03-29 MED ORDER — LIDOCAINE HCL (PF) 2 % IJ SOLN
INTRAMUSCULAR | Status: AC
  Filled 2020-03-29: qty 5

## 2020-03-29 MED ORDER — PROPOFOL 500 MG/50ML IV EMUL
INTRAVENOUS | Status: DC | PRN
  Administered 2020-03-29: 25 ug/kg/min via INTRAVENOUS

## 2020-03-29 MED ORDER — METOCLOPRAMIDE HCL 10 MG PO TABS: 5.0000 mg | ORAL_TABLET | Freq: Three times a day (TID) | ORAL | Status: DC | PRN

## 2020-03-29 MED ORDER — TRAMADOL HCL 50 MG PO TABS
50.0000 mg | ORAL_TABLET | Freq: Four times a day (QID) | ORAL | Status: DC | PRN
  Administered 2020-03-29 – 2020-04-03 (×6): 50 mg via ORAL
  Filled 2020-03-29 (×6): qty 1

## 2020-03-29 MED ORDER — CHLORHEXIDINE GLUCONATE CLOTH 2 % EX PADS
6.0000 | MEDICATED_PAD | Freq: Every day | CUTANEOUS | Status: DC
  Administered 2020-03-30 – 2020-04-01 (×3): 6 via TOPICAL

## 2020-03-29 MED ORDER — ENSURE MAX PROTEIN PO LIQD
11.0000 [oz_av] | Freq: Two times a day (BID) | ORAL | Status: DC
  Administered 2020-03-30 – 2020-04-03 (×9): 11 [oz_av] via ORAL
  Filled 2020-03-29: qty 330

## 2020-03-29 MED ORDER — ONDANSETRON HCL 4 MG/2ML IJ SOLN
INTRAMUSCULAR | Status: AC
  Filled 2020-03-29: qty 2

## 2020-03-29 MED ORDER — PHENYLEPHRINE HCL (PRESSORS) 10 MG/ML IV SOLN
INTRAVENOUS | Status: DC | PRN
  Administered 2020-03-29 (×2): 50 ug via INTRAVENOUS
  Administered 2020-03-29: 200 ug via INTRAVENOUS
  Administered 2020-03-29: 300 ug via INTRAVENOUS

## 2020-03-29 MED ORDER — ACETAMINOPHEN 500 MG PO TABS
500.0000 mg | ORAL_TABLET | Freq: Four times a day (QID) | ORAL | Status: AC
  Administered 2020-03-29 – 2020-03-30 (×4): 500 mg via ORAL
  Filled 2020-03-29 (×4): qty 1

## 2020-03-29 MED ORDER — ONDANSETRON HCL 4 MG/2ML IJ SOLN: 4.0000 mg | Freq: Four times a day (QID) | INTRAMUSCULAR | Status: DC | PRN

## 2020-03-29 MED ORDER — BUPIVACAINE LIPOSOME 1.3 % IJ SUSP
INTRAMUSCULAR | Status: AC
  Filled 2020-03-29: qty 20

## 2020-03-29 MED ORDER — FENTANYL CITRATE (PF) 100 MCG/2ML IJ SOLN
INTRAMUSCULAR | Status: DC | PRN
  Administered 2020-03-29: 25 ug via INTRAVENOUS

## 2020-03-29 MED ORDER — MAGNESIUM HYDROXIDE 400 MG/5ML PO SUSP: 30.0000 mL | Freq: Every day | ORAL | Status: DC | PRN

## 2020-03-29 MED ORDER — TRANEXAMIC ACID 1000 MG/10ML IV SOLN
INTRAVENOUS | Status: AC
  Filled 2020-03-29: qty 10

## 2020-03-29 MED ORDER — ROCURONIUM BROMIDE 10 MG/ML (PF) SYRINGE
PREFILLED_SYRINGE | INTRAVENOUS | Status: AC
  Filled 2020-03-29: qty 10

## 2020-03-29 MED ORDER — BUPIVACAINE LIPOSOME 1.3 % IJ SUSP
INTRAMUSCULAR | Status: DC | PRN
  Administered 2020-03-29: 20 mL

## 2020-03-29 MED ORDER — BUPIVACAINE HCL (PF) 0.5 % IJ SOLN
INTRAMUSCULAR | Status: DC | PRN
  Administered 2020-03-29: 2.6 mL

## 2020-03-29 MED ORDER — FENTANYL CITRATE (PF) 100 MCG/2ML IJ SOLN: 25.0000 ug | INTRAMUSCULAR | Status: DC | PRN

## 2020-03-29 MED ORDER — FENTANYL CITRATE (PF) 100 MCG/2ML IJ SOLN
INTRAMUSCULAR | Status: AC
  Filled 2020-03-29: qty 2

## 2020-03-29 MED ORDER — IPRATROPIUM-ALBUTEROL 0.5-2.5 (3) MG/3ML IN SOLN
RESPIRATORY_TRACT | Status: AC
  Administered 2020-03-29: 3 mL
  Filled 2020-03-29: qty 3

## 2020-03-29 MED ORDER — METOCLOPRAMIDE HCL 5 MG/ML IJ SOLN: 5.0000 mg | Freq: Three times a day (TID) | INTRAMUSCULAR | Status: DC | PRN

## 2020-03-29 MED ORDER — ALBUMIN HUMAN 5 % IV SOLN
INTRAVENOUS | Status: AC
  Filled 2020-03-29: qty 250

## 2020-03-29 MED ORDER — CEFAZOLIN SODIUM-DEXTROSE 2-4 GM/100ML-% IV SOLN
INTRAVENOUS | Status: AC
  Filled 2020-03-29: qty 100

## 2020-03-29 MED ORDER — FLEET ENEMA 7-19 GM/118ML RE ENEM: 1.0000 | ENEMA | Freq: Once | RECTAL | Status: DC | PRN

## 2020-03-29 MED ORDER — LACTATED RINGERS IV SOLN: INTRAVENOUS | Status: DC | PRN

## 2020-03-29 MED ORDER — ENOXAPARIN SODIUM 40 MG/0.4ML ~~LOC~~ SOLN
40.0000 mg | SUBCUTANEOUS | Status: DC
  Administered 2020-03-30 – 2020-03-31 (×2): 40 mg via SUBCUTANEOUS
  Filled 2020-03-29 (×2): qty 0.4

## 2020-03-29 MED ORDER — HYDROMORPHONE HCL 1 MG/ML IJ SOLN
0.5000 mg | INTRAMUSCULAR | Status: DC | PRN
  Administered 2020-03-29: 0.5 mg via INTRAVENOUS
  Filled 2020-03-29: qty 1

## 2020-03-29 MED ORDER — BISACODYL 10 MG RE SUPP
10.0000 mg | Freq: Every day | RECTAL | Status: DC | PRN
  Filled 2020-03-29: qty 1

## 2020-03-29 MED ORDER — TRANEXAMIC ACID 1000 MG/10ML IV SOLN
INTRAVENOUS | Status: DC | PRN
  Administered 2020-03-29: 1000 mg via INTRAVENOUS

## 2020-03-29 MED ORDER — MIDODRINE HCL 5 MG PO TABS
10.0000 mg | ORAL_TABLET | Freq: Two times a day (BID) | ORAL | Status: DC
  Administered 2020-03-29 – 2020-04-03 (×11): 10 mg via ORAL
  Filled 2020-03-29 (×10): qty 2

## 2020-03-29 MED ORDER — ADULT MULTIVITAMIN W/MINERALS CH
1.0000 | ORAL_TABLET | Freq: Every day | ORAL | Status: DC
  Administered 2020-03-30 – 2020-04-03 (×5): 1 via ORAL
  Filled 2020-03-29 (×5): qty 1

## 2020-03-29 MED ORDER — KETAMINE HCL 50 MG/ML IJ SOLN
INTRAMUSCULAR | Status: DC | PRN
  Administered 2020-03-29: 25 mg via INTRAMUSCULAR

## 2020-03-29 MED ORDER — DIPHENHYDRAMINE HCL 12.5 MG/5ML PO ELIX: 12.5000 mg | ORAL_SOLUTION | ORAL | Status: DC | PRN

## 2020-03-29 MED ORDER — IPRATROPIUM-ALBUTEROL 0.5-2.5 (3) MG/3ML IN SOLN: 3.0000 mL | Freq: Once | RESPIRATORY_TRACT | Status: DC

## 2020-03-29 MED ORDER — PROPOFOL 10 MG/ML IV BOLUS
INTRAVENOUS | Status: DC | PRN
  Administered 2020-03-29: 10 mg via INTRAVENOUS

## 2020-03-29 MED ORDER — BUPIVACAINE-EPINEPHRINE (PF) 0.5% -1:200000 IJ SOLN
INTRAMUSCULAR | Status: AC
  Filled 2020-03-29: qty 30

## 2020-03-29 MED ORDER — BUPIVACAINE HCL (PF) 0.5 % IJ SOLN
INTRAMUSCULAR | Status: AC
  Filled 2020-03-29: qty 10

## 2020-03-29 MED ORDER — DEXAMETHASONE SODIUM PHOSPHATE 10 MG/ML IJ SOLN
INTRAMUSCULAR | Status: AC
  Filled 2020-03-29: qty 1

## 2020-03-29 MED ORDER — ALBUMIN HUMAN 5 % IV SOLN: INTRAVENOUS | Status: DC | PRN

## 2020-03-29 MED ORDER — DEXTROSE IN LACTATED RINGERS 5 % IV SOLN: INTRAVENOUS | Status: DC

## 2020-03-29 SURGICAL SUPPLY — 65 items
BAG DECANTER FOR FLEXI CONT (MISCELLANEOUS) IMPLANT
BLADE SAGITTAL WIDE XTHICK NO (BLADE) ×2 IMPLANT
BLADE SURG SZ20 CARB STEEL (BLADE) ×2 IMPLANT
BNDG COHESIVE 6X5 TAN STRL LF (GAUZE/BANDAGES/DRESSINGS) ×2 IMPLANT
BOWL CEMENT MIXING ADV NOZZLE (MISCELLANEOUS) IMPLANT
CANISTER SUCT 1200ML W/VALVE (MISCELLANEOUS) ×2 IMPLANT
CANISTER SUCT 3000ML PPV (MISCELLANEOUS) ×4 IMPLANT
CHLORAPREP W/TINT 26 (MISCELLANEOUS) ×4 IMPLANT
COVER WAND RF STERILE (DRAPES) ×2 IMPLANT
DECANTER SPIKE VIAL GLASS SM (MISCELLANEOUS) ×4 IMPLANT
DRAPE 3/4 80X56 (DRAPES) ×2 IMPLANT
DRAPE IMP U-DRAPE 54X76 (DRAPES) ×2 IMPLANT
DRAPE INCISE IOBAN 66X60 STRL (DRAPES) ×2 IMPLANT
DRAPE SPLIT 6X30 W/TAPE (DRAPES) ×4 IMPLANT
DRAPE SURG 17X11 SM STRL (DRAPES) ×2 IMPLANT
DRAPE SURG 17X23 STRL (DRAPES) ×2 IMPLANT
DRSG OPSITE POSTOP 4X12 (GAUZE/BANDAGES/DRESSINGS) ×2 IMPLANT
DRSG OPSITE POSTOP 4X14 (GAUZE/BANDAGES/DRESSINGS) IMPLANT
DRSG OPSITE POSTOP 4X8 (GAUZE/BANDAGES/DRESSINGS) ×2 IMPLANT
ELECT BLADE 6.5 EXT (BLADE) ×2 IMPLANT
ELECT CAUTERY BLADE 6.4 (BLADE) ×2 IMPLANT
ELECT REM PT RETURN 9FT ADLT (ELECTROSURGICAL) ×2
ELECTRODE REM PT RTRN 9FT ADLT (ELECTROSURGICAL) ×1 IMPLANT
GAUZE PACK 2X3YD (PACKING) IMPLANT
GLOVE BIO SURGEON STRL SZ8 (GLOVE) ×4 IMPLANT
GLOVE BIOGEL M STRL SZ7.5 (GLOVE) IMPLANT
GLOVE BIOGEL PI IND STRL 8 (GLOVE) IMPLANT
GLOVE BIOGEL PI INDICATOR 8 (GLOVE)
GLOVE INDICATOR 8.0 STRL GRN (GLOVE) ×2 IMPLANT
GOWN STRL REUS W/ TWL LRG LVL3 (GOWN DISPOSABLE) ×1 IMPLANT
GOWN STRL REUS W/ TWL XL LVL3 (GOWN DISPOSABLE) ×1 IMPLANT
GOWN STRL REUS W/TWL LRG LVL3 (GOWN DISPOSABLE) ×1
GOWN STRL REUS W/TWL XL LVL3 (GOWN DISPOSABLE) ×1
HEAD ENDO II MOD SZ 46 (Orthopedic Implant) ×2 IMPLANT
HOOD PEEL AWAY FLYTE STAYCOOL (MISCELLANEOUS) ×4 IMPLANT
INSERT TAPER ENDO II STD (Orthopedic Implant) ×2 IMPLANT
IV NS 100ML SINGLE PACK (IV SOLUTION) IMPLANT
LABEL OR SOLS (LABEL) ×2 IMPLANT
MANIFOLD NEPTUNE II (INSTRUMENTS) ×2 IMPLANT
NDL SAFETY ECLIPSE 18X1.5 (NEEDLE) ×1 IMPLANT
NEEDLE FILTER BLUNT 18X 1/2SAF (NEEDLE) ×1
NEEDLE FILTER BLUNT 18X1 1/2 (NEEDLE) ×1 IMPLANT
NEEDLE HYPO 18GX1.5 SHARP (NEEDLE) ×1
NEEDLE SPNL 20GX3.5 QUINCKE YW (NEEDLE) ×2 IMPLANT
NS IRRIG 1000ML POUR BTL (IV SOLUTION) ×2 IMPLANT
PACK HIP PROSTHESIS (MISCELLANEOUS) ×2 IMPLANT
PULSAVAC PLUS IRRIG FAN TIP (DISPOSABLE) ×2
SOL .9 NS 3000ML IRR  AL (IV SOLUTION) ×2
SOL .9 NS 3000ML IRR UROMATIC (IV SOLUTION) ×2 IMPLANT
STAPLER SKIN PROX 35W (STAPLE) ×2 IMPLANT
STEM FEMORAL 11MM X 130MM ECHO (Stem) ×2 IMPLANT
STRAP SAFETY 5IN WIDE (MISCELLANEOUS) ×2 IMPLANT
SUT ETHIBOND 2 V 37 (SUTURE) ×2 IMPLANT
SUT VIC AB 1 CT1 36 (SUTURE) IMPLANT
SUT VIC AB 2-0 CT1 (SUTURE) ×4 IMPLANT
SUT VIC AB 2-0 CT1 27 (SUTURE)
SUT VIC AB 2-0 CT1 TAPERPNT 27 (SUTURE) IMPLANT
SUT VICRYL 1-0 27IN ABS (SUTURE) ×4
SUTURE VICRYL 1-0 27IN ABS (SUTURE) ×2 IMPLANT
SYR 10ML LL (SYRINGE) ×2 IMPLANT
SYR 30ML LL (SYRINGE) ×6 IMPLANT
SYR TB 1ML 27GX1/2 LL (SYRINGE) IMPLANT
TAPE TRANSPORE STRL 2 31045 (GAUZE/BANDAGES/DRESSINGS) ×2 IMPLANT
TIP BRUSH PULSAVAC PLUS 24.33 (MISCELLANEOUS) ×2 IMPLANT
TIP FAN IRRIG PULSAVAC PLUS (DISPOSABLE) ×1 IMPLANT

## 2020-03-29 NOTE — Progress Notes (Addendum)
PROGRESS NOTE    Heather Peterson  JEH:[id number] DOB: 03-13-1941 DOA: 03/28/2020 PCP: [name], MD   Brief Narrative: Taken from H&P  Heather Peterson is a 79 y.o. female with medical history significant ofCAD, combined CHF, CKD, DM, hypothyroid disease, 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 (sees Dr. Grayland Ormond) presents s/p fall, resulted in comminuted mildly impacted right femoral neck fracture involving the greater trochanter. Orthopedic was consulted-going to OR today.  Subjective: Patient was complaining of right hip pain.  Assessment & Plan:   Active Problems:   CHF (congestive heart failure), NYHA class III, chronic, combined (HCC)   Ischemic cardiomyopathy   CKD (chronic kidney disease), stage III (HCC)   Multiple myeloma (HCC)   Hip fracture (HCC)  Right femoral neck fracture secondary to fall.  There was some concern of hypotension as she was having some dizziness.  No loss of consciousness.  Cardiology was consulted for preop clearance which was done this morning and she is going to OR with orthopedic later today. -Continue with pain management. -Follow-up postoperative recommendations. -PT evaluation after the surgery.  History of HFrEF.  Patient appears euvolemic.  EF of 35 to 40% according to the echo done in 2018.  Cardiology was consulted for preop clearance. -Holding home dose of diuretic due to concern of postural hypotension. -Monitor intake and output. -Gentle IV hydration as patient is currently n.p.o. -Can give Lasix if needed.  Hypotension.  Unable to check orthostatic vitals. -Continue home dose of midodrine. -Careful with pain medications.  CKD stage IIIb.  Baseline creatinine around 1.6.  Currently at 1.37. -Continue to monitor. -Avoid nephrotoxins  Multiple myeloma.  On chemotherapy. -Being managed by outpatient oncology.  Insulin-dependent type 2 diabetes mellitus.  Patient was on Lantus at home. -Continue with  SSI  Objective: Vitals:   03/29/20 0733 03/29/20 1105 03/29/20 1517 03/29/20 1616  BP: (!) 81/54 (!) 94/55 98/69 (!) 98/59  Pulse: 75 75 77 79  Resp: 15 16 17 20   Temp: 98.3 F (36.8 C) 98.2 F (36.8 C) 97.9 F (36.6 C) (!) 97.4 F (36.3 C)  TempSrc: Oral  Oral Temporal  SpO2: 98% 98% 95% 96%  Weight:    78.5 kg  Height:    5' 4"  (1.626 m)    Intake/Output Summary (Last 24 hours) at 03/29/2020 1717 Last data filed at 03/29/2020 1414 Gross per 24 hour  Intake 1115.13 ml  Output 200 ml  Net 915.13 ml   Filed Weights   03/29/20 0500 03/29/20 1616  Weight: 78.5 kg 78.5 kg    Examination:  General exam: Appears calm and comfortable  Respiratory system: Clear to auscultation. Respiratory effort normal. Cardiovascular system: S1 & S2 heard, RRR. Gastrointestinal system: Soft, nontender, nondistended, bowel sounds positive. Central nervous system: Alert and oriented. No focal neurological deficits. Extremities: No edema, no cyanosis, pulses intact and symmetrical.  Right foot with some external rotation. Psychiatry: Judgement and insight appear normal.    DVT prophylaxis: SCDs Code Status: Full Family Communication: Discussed with patient Disposition Plan:  Status is: Inpatient  Remains inpatient appropriate because:Inpatient level of care appropriate due to severity of illness   Dispo: The patient is from: Home              Anticipated d/c is to: To be determined              Anticipated d/c date is: 2 days              Patient currently is  not medically stable to d/c.  Consultants:   Cardiology  Orthopedic  Procedures:  Antimicrobials:   Data Reviewed: I have personally reviewed following labs and imaging studies  CBC: Recent Labs  Lab 03/28/20 1435 03/29/20 0611  WBC 5.7 5.8  NEUTROABS 3.9  --   HGB 8.0* 8.6*  HCT 25.7* 27.4*  MCV 95.2 94.5  PLT 95* 254*   Basic Metabolic Panel: Recent Labs  Lab 03/28/20 1435 03/29/20 0611  NA 136 137  K  2.9* 4.1  CL 94* 97*  CO2 32 31  GLUCOSE 95 97  BUN 42* 33*  CREATININE 1.36* 1.37*  CALCIUM 7.5* 8.0*  MG 2.6*  --    GFR: Estimated Creatinine Clearance: 34.3 mL/min (A) (by C-G formula based on SCr of 1.37 mg/dL (H)). Liver Function Tests: Recent Labs  Lab 03/28/20 1435  AST 22  ALT 25  ALKPHOS 67  BILITOT 0.8  PROT 5.6*  ALBUMIN 3.3*   No results for input(s): LIPASE, AMYLASE in the last 168 hours. No results for input(s): AMMONIA in the last 168 hours. Coagulation Profile: Recent Labs  Lab 03/28/20 1609 03/29/20 0902  INR 1.3* 1.3*   Cardiac Enzymes: No results for input(s): CKTOTAL, CKMB, CKMBINDEX, TROPONINI in the last 168 hours. BNP (last 3 results) No results for input(s): PROBNP in the last 8760 hours. HbA1C: Recent Labs    03/29/20 0611  HGBA1C 5.9*   CBG: Recent Labs  Lab 03/28/20 2355 03/29/20 0345 03/29/20 0736 03/29/20 1150 03/29/20 1619  GLUCAP 293* 94 105* 87 100*   Lipid Profile: No results for input(s): CHOL, HDL, LDLCALC, TRIG, CHOLHDL, LDLDIRECT in the last 72 hours. Thyroid Function Tests: No results for input(s): TSH, T4TOTAL, FREET4, T3FREE, THYROIDAB in the last 72 hours. Anemia Panel: No results for input(s): VITAMINB12, FOLATE, FERRITIN, TIBC, IRON, RETICCTPCT in the last 72 hours. Sepsis Labs: No results for input(s): PROCALCITON, LATICACIDVEN in the last 168 hours.  Recent Results (from the past 240 hour(s))  Respiratory Panel by RT PCR (Flu A&B, Covid) - Nasopharyngeal Swab     Status: None   Collection Time: 03/28/20  3:33 PM   Specimen: Nasopharyngeal Swab  Result Value Ref Range Status   SARS Coronavirus 2 by RT PCR NEGATIVE NEGATIVE Final    Comment: (NOTE) SARS-CoV-2 target nucleic acids are NOT DETECTED.  The SARS-CoV-2 RNA is generally detectable in upper respiratoy specimens during the acute phase of infection. The lowest concentration of SARS-CoV-2 viral copies this assay can detect is 131 copies/mL. A  negative result does not preclude SARS-Cov-2 infection and should not be used as the sole basis for treatment or other patient management decisions. A negative result may occur with  improper specimen collection/handling, submission of specimen other than nasopharyngeal swab, presence of viral mutation(s) within the areas targeted by this assay, and inadequate number of viral copies (<131 copies/mL). A negative result must be combined with clinical observations, patient history, and epidemiological information. The expected result is Negative.  Fact Sheet for Patients:  PinkCheek.be  Fact Sheet for Healthcare Providers:  GravelBags.it  This test is no t yet approved or cleared by the [place] FDA and  has been authorized for detection and/or diagnosis of SARS-CoV-2 by FDA under an Emergency Use Authorization (EUA). This EUA will remain  in effect (meaning this test can be used) for the duration of the COVID-19 declaration under Section 564(b)(1) of the Act, 21 U.S.C. section 360bbb-3(b)(1), unless the authorization is terminated or revoked sooner.  Influenza A by PCR NEGATIVE NEGATIVE Final   Influenza B by PCR NEGATIVE NEGATIVE Final    Comment: (NOTE) The Xpert Xpress SARS-CoV-2/FLU/RSV assay is intended as an aid in  the diagnosis of influenza from Nasopharyngeal swab specimens and  should not be used as a sole basis for treatment. Nasal washings and  aspirates are unacceptable for Xpert Xpress SARS-CoV-2/FLU/RSV  testing.  Fact Sheet for Patients: PinkCheek.be  Fact Sheet for Healthcare Providers: GravelBags.it  This test is not yet approved or cleared by the [place] FDA and  has been authorized for detection and/or diagnosis of SARS-CoV-2 by  FDA under an Emergency Use Authorization (EUA). This EUA will remain  in effect (meaning this test can  be used) for the duration of the  Covid-19 declaration under Section 564(b)(1) of the Act, 21  U.S.C. section 360bbb-3(b)(1), unless the authorization is  terminated or revoked. Performed at [facility] [address], [place], [place] [address]      Radiology Studies: DG Chest 1 View  Result Date: 03/28/2020 CLINICAL DATA:  Fall.  Landed on her RIGHT side.  RIGHT hip pain. EXAM: CHEST  1 VIEW COMPARISON:  03/15/2020 FINDINGS: Heart is enlarged, stable in configuration. LEFT-sided AICD is unchanged. No focal consolidations or pleural effusions. No pulmonary edema. Atherosclerotic calcification of the thoracic aorta. IMPRESSION: Stable cardiomegaly. Electronically Signed   By: Nolon Nations M.D.   On: 03/28/2020 15:38   CT Head Wo Contrast  Result Date: 03/28/2020 CLINICAL DATA:  Trauma. EXAM: CT HEAD WITHOUT CONTRAST CT CERVICAL SPINE WITHOUT CONTRAST TECHNIQUE: Multidetector CT imaging of the head and cervical spine was performed following the standard protocol without intravenous contrast. Multiplanar CT image reconstructions of the cervical spine were also generated. COMPARISON:  None. FINDINGS: CT HEAD FINDINGS Brain: No evidence of acute large vascular territory infarction, hemorrhage, hydrocephalus, extra-axial collection or mass lesion/mass effect. Remote right frontal infarct with encephalomalacia. Patchy white matter hypoattenuation, compatible with chronic microvascular ischemic disease. Generalized cerebral atrophy. Vascular: Calcific atherosclerosis. Skull: No acute fracture Sinuses/Orbits: Visualized sinuses are clear. Unremarkable visualized orbits. Other: No mastoid effusions. CT CERVICAL SPINE FINDINGS Alignment: Mild reversal of the normal cervical lordosis, likely positional. Mild anterolisthesis of C3 on C4, likely degenerative given facet arthropathy at this level. Otherwise, no substantial subluxation. Skull base and vertebrae: No acute fracture. Vertebral body  heights are maintained. Soft tissues and spinal canal: No prevertebral fluid or swelling. No visible canal hematoma. Disc levels: Mild multilevel degenerative disc disease. Multilevel facet arthropathy, greatest at C3-C4. Upper chest: Emphysema. Other: Calcific atherosclerosis of the carotids. IMPRESSION: CT head: 1. No evidence of acute intracranial abnormality. 2. Remote right frontal infarct with encephalomalacia. 3. Chronic microvascular ischemic disease. CT cervical spine: No evidence of acute fracture or traumatic malalignment. Electronically Signed   By: Margaretha Sheffield MD   On: 03/28/2020 15:45   CT Cervical Spine Wo Contrast  Result Date: 03/28/2020 CLINICAL DATA:  Trauma. EXAM: CT HEAD WITHOUT CONTRAST CT CERVICAL SPINE WITHOUT CONTRAST TECHNIQUE: Multidetector CT imaging of the head and cervical spine was performed following the standard protocol without intravenous contrast. Multiplanar CT image reconstructions of the cervical spine were also generated. COMPARISON:  None. FINDINGS: CT HEAD FINDINGS Brain: No evidence of acute large vascular territory infarction, hemorrhage, hydrocephalus, extra-axial collection or mass lesion/mass effect. Remote right frontal infarct with encephalomalacia. Patchy white matter hypoattenuation, compatible with chronic microvascular ischemic disease. Generalized cerebral atrophy. Vascular: Calcific atherosclerosis. Skull: No acute fracture Sinuses/Orbits: Visualized sinuses are clear.  Unremarkable visualized orbits. Other: No mastoid effusions. CT CERVICAL SPINE FINDINGS Alignment: Mild reversal of the normal cervical lordosis, likely positional. Mild anterolisthesis of C3 on C4, likely degenerative given facet arthropathy at this level. Otherwise, no substantial subluxation. Skull base and vertebrae: No acute fracture. Vertebral body heights are maintained. Soft tissues and spinal canal: No prevertebral fluid or swelling. No visible canal hematoma. Disc levels: Mild  multilevel degenerative disc disease. Multilevel facet arthropathy, greatest at C3-C4. Upper chest: Emphysema. Other: Calcific atherosclerosis of the carotids. IMPRESSION: CT head: 1. No evidence of acute intracranial abnormality. 2. Remote right frontal infarct with encephalomalacia. 3. Chronic microvascular ischemic disease. CT cervical spine: No evidence of acute fracture or traumatic malalignment. Electronically Signed   By: Margaretha Sheffield MD   On: 03/28/2020 15:45   DG Hip Unilat W or Wo Pelvis 2-3 Views Right  Result Date: 03/28/2020 CLINICAL DATA:  Fall EXAM: DG HIP (WITH OR WITHOUT PELVIS) 2-3V RIGHT COMPARISON:  None. FINDINGS: There is a comminuted impacted minimally displaced fracture of the right transcervical femoral neck involving the greater trochanter. The femoral head is still well seated within the acetabulum. Overlying soft tissue swelling is noted. IMPRESSION: Comminuted mildly impacted right transcervical femoral neck fracture involving the greater trochanter. Electronically Signed   By: Prudencio Pair M.D.   On: 03/28/2020 15:38    Scheduled Meds: . [MAR Hold] allopurinol  50 mg Oral Daily  . [MAR Hold] aspirin EC  81 mg Oral QHS  . [MAR Hold] insulin aspart  0-15 Units Subcutaneous Q4H  . [MAR Hold] magnesium oxide  200 mg Oral Daily  . [MAR Hold] mexiletine  200 mg Oral Q12H  . [MAR Hold] midodrine  10 mg Oral BID WC  . [MAR Hold] multivitamin with minerals  1 tablet Oral Daily  . [MAR Hold] pantoprazole  40 mg Oral Daily  . [MAR Hold] Ensure Max Protein  11 oz Oral BID BM  . [MAR Hold] simvastatin  20 mg Oral QHS  . [MAR Hold] traZODone  50 mg Oral QHS   Continuous Infusions: . ceFAZolin    . [MAR Hold]  ceFAZolin (ANCEF) IV    . dextrose 5% lactated ringers 50 mL/hr at 03/29/20 1414     LOS: 1 day   Time spent: 35 minutes.  [name], MD [facility]  If 7PM-7AM, please contact night-coverage [email or web address]  03/29/2020, 5:17 PM   This record  has been created using Systems analyst. Errors have been sought and corrected,but may not always be located. Such creation errors do not reflect on the standard of care.

## 2020-03-29 NOTE — Anesthesia Procedure Notes (Signed)
Date/Time: 03/29/2020 5:50 PM Performed by: Johnna Acosta, CRNA Pre-anesthesia Checklist: Patient identified, Emergency Drugs available, Suction available, Patient being monitored and Timeout performed Patient Re-evaluated:Patient Re-evaluated prior to induction Oxygen Delivery Method: Simple face mask Induction Type: IV induction

## 2020-03-29 NOTE — Consult Note (Signed)
[facility]  [phone] [phone]  ID: Heather Peterson OB: December 15, 1940  MR#: [id number]  AOZ#:[id number]  [name], MD as PCP - General (Family Medicine) Lloyd Huger, MD as Consulting Physician (Oncology)  CHIEF COMPLAINT: Multiple Myeloma, fractured right hip, anemia.  INTERVAL HISTORY: Patient is a 79 year old female who is actively receiving chemotherapy for Multiple Myeloma who had a fall yesterday fracturing her right hip.  She underwent surgery today.  She continues to have pain.  She has persistent weakness and fatigue.  She has chronic shortness of breath and requires oxygen 24 hours/day. She has no neurologic complaints.  She denies any recent fevers.  She has a fair appetite.  She denies any chest pain, cough, or hemoptysis.  She denies any nausea, vomiting, constipation, or diarrhea.  She has no melena or hematochezia.  She has no urinary complaints.  Patient offers no further specific complaints today.  REVIEW OF SYSTEMS:   Review of Systems  Constitutional: Positive for malaise/fatigue. Negative for fever and weight loss.  Respiratory: Positive for shortness of breath. Negative for cough and hemoptysis.   Cardiovascular: Negative.  Negative for chest pain and leg swelling.  Gastrointestinal: Negative.  Negative for abdominal pain.  Genitourinary: Negative.  Negative for dysuria.  Musculoskeletal: Positive for falls and joint pain.  Skin: Negative.  Negative for rash.  Neurological: Positive for weakness. Negative for dizziness, focal weakness and headaches.  Psychiatric/Behavioral: Negative.  The patient is not nervous/anxious.     As per HPI. Otherwise, a complete review of systems is negative.  PAST MEDICAL HISTORY: Past Medical History:  Diagnosis Date  . Anxiety   . Chronic combined systolic and diastolic CHF (congestive heart failure) ([place])   . Chronic kidney disease   . Coronary  artery disease   . Depression   . Diabetes mellitus without complication ([place])   . Diabetes mellitus, type II ([place])   . Hypertension   . MI (myocardial infarction) ([place])    x 5  . Pacemaker   . Prolonged Q-T interval on ECG   . Thyroid disease     PAST SURGICAL HISTORY: Past Surgical History:  Procedure Laterality Date  . CENTRAL LINE INSERTION  03/11/2017   Procedure: CENTRAL LINE INSERTION;  Surgeon: Leonie Man, MD;  Location: [place] [facility];  Service: Cardiovascular;;  . CHOLECYSTECTOMY    . COLONOSCOPY WITH PROPOFOL N/A 09/01/2019   Procedure: COLONOSCOPY WITH PROPOFOL;  Surgeon: Toledo, Benay Pike, MD;  Location: [facility] ENDOSCOPY;  Service: Gastroenterology;  Laterality: N/A;  . CORONARY STENT INTERVENTION W/IMPELLA N/A 03/11/2017   Procedure: Coronary Stent Intervention w/Impella;  Surgeon: Leonie Man, MD;  Location: [place] [facility];  Service: Cardiovascular;  Laterality: N/A;  . ESOPHAGOGASTRODUODENOSCOPY (EGD) WITH PROPOFOL N/A 09/01/2019   Procedure: ESOPHAGOGASTRODUODENOSCOPY (EGD) WITH PROPOFOL;  Surgeon: Toledo, Benay Pike, MD;  Location: [facility] ENDOSCOPY;  Service: Gastroenterology;  Laterality: N/A;  . ESOPHAGOGASTRODUODENOSCOPY (EGD) WITH PROPOFOL N/A 09/08/2019   Procedure: ESOPHAGOGASTRODUODENOSCOPY (EGD) WITH PROPOFOL;  Surgeon: [name], MD;  Location: [facility] ENDOSCOPY;  Service: Gastroenterology;  Laterality: N/A;  . EYE SURGERY    . INTRAVASCULAR PRESSURE WIRE/FFR STUDY N/A 03/11/2017   Procedure: INTRAVASCULAR PRESSURE WIRE/FFR STUDY;  Surgeon: Leonie Man, MD;  Location: [place] [facility];  Service: Cardiovascular;  Laterality: N/A;  . INTRAVASCULAR ULTRASOUND/IVUS N/A 03/11/2017   Procedure: Intravascular Ultrasound/IVUS;  Surgeon: Leonie Man, MD;  Location: [place] [facility];  Service:  Cardiovascular;  Laterality: N/A;  . LEFT HEART CATH AND CORONARY ANGIOGRAPHY N/A 03/05/2017   Procedure: LEFT HEART CATH AND CORONARY ANGIOGRAPHY;   Surgeon: Isaias Cowman, MD;  Location: [place] [facility];  Service: Cardiovascular;  Laterality: N/A;  . PACEMAKER IMPLANT    . pacemaker/defibrillator Left     FAMILY HISTORY: Family History  Problem Relation Age of Onset  . Hypertension Father   . Heart attack Father   . Depression Sister   . Depression Brother   . Depression Brother     ADVANCED DIRECTIVES (Y/N):  @ADVDIR @  HEALTH MAINTENANCE: Social History   Tobacco Use  . Smoking status: Current Every Day Smoker    Packs/day: 0.25    Types: E-cigarettes, Cigarettes  . Smokeless tobacco: Never Used  Vaping Use  . Vaping Use: Former  Substance Use Topics  . Alcohol use: Not Currently    Comment: occasionally  . Drug use: No     Colonoscopy:  PAP:  Bone density:  Lipid panel:  Allergies  Allergen Reactions  . Celebrex [Celecoxib] Anaphylaxis  . Glipizide Anaphylaxis  . Levaquin [Levofloxacin In D5w] Other (See Comments)    Heart arrhthymias  . Levofloxacin Other (See Comments) and Palpitations    ICD fired  . Lisinopril Swelling    Lip and facial swelling  . Sulfa Antibiotics Other (See Comments) and Anaphylaxis    Reaction: unknown  . Metformin Other (See Comments)    Gi tolerance   . Penicillins Rash and Other (See Comments)    Has patient had a PCN reaction causing immediate rash, facial/tongue/throat swelling, SOB or lightheadedness with hypotension: Unknown Has patient had a PCN reaction causing severe rash involving mucus membranes or skin necrosis: No Has patient had a PCN reaction that required hospitalization: No Has patient had a PCN reaction occurring within the last 10 years: No If all of the above answers are "NO", then may proceed with Cephalosporin use.     Current Facility-Administered Medications  Medication Dose Route Frequency Provider Last Rate Last Admin  . [START ON 03/30/2020] acetaminophen (TYLENOL) tablet 325-650 mg  325-650 mg Oral Q6H PRN Poggi, Marshall Cork, MD      .  Derrill Memo ON 03/30/2020] acetaminophen (TYLENOL) tablet 500 mg  500 mg Oral Q6H Poggi, Marshall Cork, MD      . allopurinol (ZYLOPRIM) tablet 50 mg  50 mg Oral Daily Poggi, Marshall Cork, MD   50 mg at 03/29/20 0929  . aspirin EC tablet 81 mg  81 mg Oral QHS Corky Mull, MD   81 mg at 03/28/20 2213  . bisacodyl (DULCOLAX) suppository 10 mg  10 mg Rectal Daily PRN Poggi, Marshall Cork, MD      . Derrill Memo ON 03/30/2020] ceFAZolin (ANCEF) IVPB 2g/100 mL premix  2 g Intravenous Q6H Poggi, Marshall Cork, MD      . Derrill Memo ON 03/30/2020] Chlorhexidine Gluconate Cloth 2 % PADS 6 each  6 each Topical Daily [name], MD      . dextrose 5 % in lactated ringers infusion   Intravenous Continuous Poggi, Marshall Cork, MD   Stopped at 03/29/20 1902  . diphenhydrAMINE (BENADRYL) 12.5 MG/5ML elixir 12.5-25 mg  12.5-25 mg Oral Q4H PRN Poggi, Marshall Cork, MD      . docusate sodium (COLACE) capsule 100 mg  100 mg Oral BID Poggi, Marshall Cork, MD      . Derrill Memo ON 03/30/2020] enoxaparin (LOVENOX) injection 40 mg  40 mg Subcutaneous Q24H Poggi, Marshall Cork, MD      .  HYDROcodone-acetaminophen (NORCO/VICODIN) 5-325 MG per tablet 1-2 tablet  1-2 tablet Oral Q4H PRN Poggi, Marshall Cork, MD   2 tablet at 03/29/20 0933  . HYDROmorphone (DILAUDID) injection 0.5 mg  0.5 mg Intravenous Q4H PRN Poggi, Marshall Cork, MD   0.5 mg at 03/29/20 1150  . insulin aspart (novoLOG) injection 0-15 Units  0-15 Units Subcutaneous Q4H Poggi, Marshall Cork, MD   8 Units at 03/29/20 0006  . magnesium citrate solution 1 Bottle  1 Bottle Oral Once PRN Poggi, Marshall Cork, MD      . magnesium hydroxide (MILK OF MAGNESIA) suspension 30 mL  30 mL Oral Daily PRN Poggi, Marshall Cork, MD      . magnesium hydroxide (MILK OF MAGNESIA) suspension 30 mL  30 mL Oral Daily PRN Poggi, Marshall Cork, MD      . magnesium oxide (MAG-OX) tablet 200 mg  200 mg Oral Daily Poggi, Marshall Cork, MD   200 mg at 03/29/20 0929  . metoCLOPramide (REGLAN) tablet 5-10 mg  5-10 mg Oral Q8H PRN Poggi, Marshall Cork, MD       Or  . metoCLOPramide (REGLAN) injection 5-10 mg   5-10 mg Intravenous Q8H PRN Poggi, Marshall Cork, MD      . mexiletine (MEXITIL) capsule 200 mg  200 mg Oral Q12H Poggi, Marshall Cork, MD   200 mg at 03/29/20 0928  . midodrine (PROAMATINE) tablet 10 mg  10 mg Oral BID WC Poggi, Marshall Cork, MD   10 mg at 03/29/20 0807  . midodrine (PROAMATINE) tablet 5 mg  5 mg Oral Daily PRN Poggi, Marshall Cork, MD      . Derrill Memo ON 03/30/2020] multivitamin with minerals tablet 1 tablet  1 tablet Oral Daily Poggi, Marshall Cork, MD      . ondansetron (ZOFRAN) tablet 4 mg  4 mg Oral Q6H PRN Poggi, Marshall Cork, MD       Or  . ondansetron (ZOFRAN) injection 4 mg  4 mg Intravenous Q6H PRN Poggi, Marshall Cork, MD      . pantoprazole (PROTONIX) EC tablet 40 mg  40 mg Oral Daily Poggi, Marshall Cork, MD   40 mg at 03/29/20 0930  . [START ON 03/30/2020] protein supplement (ENSURE MAX) liquid  11 oz Oral BID BM Poggi, Marshall Cork, MD      . simvastatin (ZOCOR) tablet 20 mg  20 mg Oral QHS Poggi, Marshall Cork, MD   20 mg at 03/28/20 2213  . sodium phosphate (FLEET) 7-19 GM/118ML enema 1 enema  1 enema Rectal Once PRN Poggi, Marshall Cork, MD      . traMADol Veatrice Bourbon) tablet 50 mg  50 mg Oral Q6H PRN Poggi, Marshall Cork, MD      . traZODone (DESYREL) tablet 50 mg  50 mg Oral QHS Corky Mull, MD   50 mg at 03/28/20 2213    OBJECTIVE: Vitals:   03/29/20 2207 03/29/20 2232  BP: (!) 103/54 107/60  Pulse: 76 77  Resp: 12 15  Temp: 98.6 F (37 C) 97.9 F (36.6 C)  SpO2: 97% 95%     Body mass index is 29.71 kg/m.    ECOG FS:2 - Symptomatic, <50% confined to bed  General: Well-developed, well-nourished, no acute distress. Eyes: Pink conjunctiva, anicteric sclera. HEENT: Normocephalic, moist mucous membranes. Lungs: No audible wheezing or coughing. Heart: Regular rate and rhythm. Abdomen: Soft, nontender, no obvious distention. Musculoskeletal: No edema, cyanosis, or clubbing. Neuro: Alert, answering all questions appropriately. Cranial nerves grossly intact. Skin: No rashes  or petechiae noted. Psych: Normal affect. Lymphatics: No  cervical, calvicular, axillary or inguinal LAD.   LAB RESULTS:  Lab Results  Component Value Date   NA 137 03/29/2020   K 4.1 03/29/2020   CL 97 (L) 03/29/2020   CO2 31 03/29/2020   GLUCOSE 97 03/29/2020   BUN 33 (H) 03/29/2020   CREATININE 1.37 (H) 03/29/2020   CALCIUM 8.0 (L) 03/29/2020   PROT 5.6 (L) 03/28/2020   ALBUMIN 3.3 (L) 03/28/2020   AST 22 03/28/2020   ALT 25 03/28/2020   ALKPHOS 67 03/28/2020   BILITOT 0.8 03/28/2020   GFRNONAA 40 (L) 03/29/2020   GFRAA 30 (L) 09/09/2019    Lab Results  Component Value Date   WBC 5.8 03/29/2020   NEUTROABS 3.9 03/28/2020   HGB 6.2 (L) 03/29/2020   HCT 20.2 (L) 03/29/2020   MCV 94.5 03/29/2020   PLT 103 (L) 03/29/2020     STUDIES: DG Chest 1 View  Result Date: 03/28/2020 CLINICAL DATA:  Fall.  Landed on her RIGHT side.  RIGHT hip pain. EXAM: CHEST  1 VIEW COMPARISON:  03/15/2020 FINDINGS: Heart is enlarged, stable in configuration. LEFT-sided AICD is unchanged. No focal consolidations or pleural effusions. No pulmonary edema. Atherosclerotic calcification of the thoracic aorta. IMPRESSION: Stable cardiomegaly. Electronically Signed   By: Nolon Nations M.D.   On: 03/28/2020 15:38   DG Chest 2 View  Result Date: 03/15/2020 CLINICAL DATA:  79 year old female with cough for 2-3 months and drainage. Multiple myeloma. EXAM: CHEST - 2 VIEW COMPARISON:  Chest radiographs 08/27/2019 and earlier. FINDINGS: Chronic cardiomegaly, left chest AICD. Improved lung volumes, and resolved small pleural effusions since April. No pneumothorax. Stable pulmonary vascularity, no overt edema. No confluent pulmonary opacity. Calcified aortic atherosclerosis. Stable visualized osseous structures. Negative visible bowel gas pattern. IMPRESSION: Cardiomegaly. Resolved pleural effusions since April. No acute cardiopulmonary abnormality. Electronically Signed   By: Genevie Ann M.D.   On: 03/15/2020 12:14   CT Head Wo Contrast  Result Date:  03/28/2020 CLINICAL DATA:  Trauma. EXAM: CT HEAD WITHOUT CONTRAST CT CERVICAL SPINE WITHOUT CONTRAST TECHNIQUE: Multidetector CT imaging of the head and cervical spine was performed following the standard protocol without intravenous contrast. Multiplanar CT image reconstructions of the cervical spine were also generated. COMPARISON:  None. FINDINGS: CT HEAD FINDINGS Brain: No evidence of acute large vascular territory infarction, hemorrhage, hydrocephalus, extra-axial collection or mass lesion/mass effect. Remote right frontal infarct with encephalomalacia. Patchy white matter hypoattenuation, compatible with chronic microvascular ischemic disease. Generalized cerebral atrophy. Vascular: Calcific atherosclerosis. Skull: No acute fracture Sinuses/Orbits: Visualized sinuses are clear. Unremarkable visualized orbits. Other: No mastoid effusions. CT CERVICAL SPINE FINDINGS Alignment: Mild reversal of the normal cervical lordosis, likely positional. Mild anterolisthesis of C3 on C4, likely degenerative given facet arthropathy at this level. Otherwise, no substantial subluxation. Skull base and vertebrae: No acute fracture. Vertebral body heights are maintained. Soft tissues and spinal canal: No prevertebral fluid or swelling. No visible canal hematoma. Disc levels: Mild multilevel degenerative disc disease. Multilevel facet arthropathy, greatest at C3-C4. Upper chest: Emphysema. Other: Calcific atherosclerosis of the carotids. IMPRESSION: CT head: 1. No evidence of acute intracranial abnormality. 2. Remote right frontal infarct with encephalomalacia. 3. Chronic microvascular ischemic disease. CT cervical spine: No evidence of acute fracture or traumatic malalignment. Electronically Signed   By: Margaretha Sheffield MD   On: 03/28/2020 15:45   CT Cervical Spine Wo Contrast  Result Date: 03/28/2020 CLINICAL DATA:  Trauma. EXAM: CT HEAD WITHOUT CONTRAST CT  CERVICAL SPINE WITHOUT CONTRAST TECHNIQUE: Multidetector CT  imaging of the head and cervical spine was performed following the standard protocol without intravenous contrast. Multiplanar CT image reconstructions of the cervical spine were also generated. COMPARISON:  None. FINDINGS: CT HEAD FINDINGS Brain: No evidence of acute large vascular territory infarction, hemorrhage, hydrocephalus, extra-axial collection or mass lesion/mass effect. Remote right frontal infarct with encephalomalacia. Patchy white matter hypoattenuation, compatible with chronic microvascular ischemic disease. Generalized cerebral atrophy. Vascular: Calcific atherosclerosis. Skull: No acute fracture Sinuses/Orbits: Visualized sinuses are clear. Unremarkable visualized orbits. Other: No mastoid effusions. CT CERVICAL SPINE FINDINGS Alignment: Mild reversal of the normal cervical lordosis, likely positional. Mild anterolisthesis of C3 on C4, likely degenerative given facet arthropathy at this level. Otherwise, no substantial subluxation. Skull base and vertebrae: No acute fracture. Vertebral body heights are maintained. Soft tissues and spinal canal: No prevertebral fluid or swelling. No visible canal hematoma. Disc levels: Mild multilevel degenerative disc disease. Multilevel facet arthropathy, greatest at C3-C4. Upper chest: Emphysema. Other: Calcific atherosclerosis of the carotids. IMPRESSION: CT head: 1. No evidence of acute intracranial abnormality. 2. Remote right frontal infarct with encephalomalacia. 3. Chronic microvascular ischemic disease. CT cervical spine: No evidence of acute fracture or traumatic malalignment. Electronically Signed   By: Margaretha Sheffield MD   On: 03/28/2020 15:45   US Abdomen Complete  Result Date: 03/11/2020 CLINICAL DATA:  Cirrhosis EXAM: ABDOMEN ULTRASOUND COMPLETE COMPARISON:  CT 07/16/2019 FINDINGS: Gallbladder: Prior cholecystectomy Common bile duct: Diameter: Normal caliber, 3 mm Liver: Diffusely increased echotexture and nodular contours. No focal hepatic  abnormality. Portal vein is patent on color Doppler imaging with normal direction of blood flow towards the liver. IVC: No abnormality visualized. Pancreas: Visualized portion unremarkable. Spleen: Size and appearance within normal limits. Right Kidney: Length: 8.4 cm. Echogenicity within normal limits. No mass or hydronephrosis visualized. Left Kidney: Length: 8.8 cm. Echogenicity within normal limits. No mass or hydronephrosis visualized. Abdominal aorta: No aneurysm visualized. Other findings: Moderate ascites. IMPRESSION: Changes of hepatic steatosis and cirrhosis. Moderate ascites. No focal hepatic abnormality. Electronically Signed   By: Rolm Baptise M.D.   On: 03/11/2020 20:44   DG Bone Survey Met  Result Date: 03/04/2020 CLINICAL DATA:  Multiple myeloma EXAM: METASTATIC BONE SURVEY COMPARISON:  None. FINDINGS: Metastatic bone survey was performed. Cardiac shadow is enlarged. Defibrillator is noted. The lungs are clear. No definitive rib abnormality is noted. Cervical spine demonstrates multilevel degenerative change. No lytic or sclerotic lesions are seen. The skull demonstrates a single lucency in the parietal bone consistent with the given clinical history. Upper extremities show no significant lytic lesions. Thoracic spine demonstrates vertebral body height to be well maintained. No paraspinal mass or pedicle abnormality is noted. Degenerative changes of lumbar spine are seen. No lytic lesion is noted. No compression deformities are noted. Scattered lytic lesions are noted within the pubic rami as well as the proximal femurs bilaterally. Distal lower extremities show no definitive lytic or sclerotic lesions. IMPRESSION: Scattered lucencies are noted as described consistent with the given clinical history of myeloma. Electronically Signed   By: Inez Catalina M.D.   On: 03/04/2020 19:59   DG HIP UNILAT W OR W/O PELVIS 2-3 VIEWS RIGHT  Result Date: 03/29/2020 CLINICAL DATA:  Postop from right hip  arthroplasty. EXAM: DG HIP (WITH OR WITHOUT PELVIS) 2-3V RIGHT COMPARISON:  None. FINDINGS: A right hip prosthesis is seen in expected position. No evidence of fracture or dislocation. IMPRESSION: Expected postoperative appearance of right hip prosthesis. Electronically Signed  By: Marlaine Hind M.D.   On: 03/29/2020 20:20   DG Hip Unilat W or Wo Pelvis 2-3 Views Right  Result Date: 03/28/2020 CLINICAL DATA:  Fall EXAM: DG HIP (WITH OR WITHOUT PELVIS) 2-3V RIGHT COMPARISON:  None. FINDINGS: There is a comminuted impacted minimally displaced fracture of the right transcervical femoral neck involving the greater trochanter. The femoral head is still well seated within the acetabulum. Overlying soft tissue swelling is noted. IMPRESSION: Comminuted mildly impacted right transcervical femoral neck fracture involving the greater trochanter. Electronically Signed   By: Prudencio Pair M.D.   On: 03/28/2020 15:38    ASSESSMENT: Multiple Myeloma, fractured right hip, anemia.  PLAN:    1.  Multiple myeloma: Bone marrow biopsy confirmed diagnosis with plasma cells up to 90% of biopsy.  Cytogenetics are reported as normal.  Metastatic bone survey from March 03, 2020 reviewed independently with multiple skeletal lucencies consistent with myeloma.  M spike is only mildly elevated at 1.2, but she has a significantly elevated IgM level of 2773.  Kappa and lambda free light chains are within normal limits.  Patient noted to have endorgan damage of renal insufficiency as well as persistent anemia.  Patient completed cycle 1 of Velcade and Revlimid last week. She is scheduled to start cycle 2 on 04/10/20.  She has been instructed to keep this appointment as scheduled for hospital follow up. Treatment will likely need to be delayed. 2.  Anemia: Likely multifactorial with chronic renal insufficiency, underlying myeloma, history of recent GI bleed, and now surgery.  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 Patients hemoglobin has dropped to 6.2. Please transfuse to maintain hemoglobin greater than 7.0. 3. Chronic renal insufficiency: Patient's creatinine is improved to 1.37.  Monitor. 4. Right hip fracture: Surgery today.  Appreciate orthopedics input.  Appreciate consult, will follow.    Lloyd Huger, MD   03/29/2020 10:43 PM

## 2020-03-29 NOTE — Anesthesia Procedure Notes (Signed)
Spinal  Patient location during procedure: OR Start time: 03/29/2020 5:50 PM End time: 03/29/2020 5:55 PM Staffing Performed: anesthesiologist  Anesthesiologist: Alvin Critchley, MD Preanesthetic Checklist Completed: patient identified, IV checked, site marked, risks and benefits discussed, surgical consent, monitors and equipment checked, pre-op evaluation and timeout performed Spinal Block Patient position: sitting Prep: ChloraPrep Patient monitoring: heart rate, continuous pulse ox, blood pressure and cardiac monitor Approach: midline Location: L3-4 Injection technique: single-shot Needle Needle type: Whitacre and Introducer  Needle gauge: 24 G Needle length: 9 cm Assessment Sensory level: T10 Additional Notes Negative paresthesia. Negative blood return. Positive free-flowing CSF. Expiration date of kit checked and confirmed. Patient tolerated procedure well, without complications.

## 2020-03-29 NOTE — Progress Notes (Signed)
Initial Nutrition Assessment  DOCUMENTATION CODES:   Not applicable  INTERVENTION:  Once diet is advanced provide Ensure Max Protein po BID, each supplement provides 150 kcal and 30 grams of protein.  Provide MVI po daily.  NUTRITION DIAGNOSIS:   Increased nutrient needs related to post-op healing, catabolic illness (CHF, multiple myeloma) as evidenced by estimated needs.  GOAL:   Patient will meet greater than or equal to 90% of their needs  MONITOR:   PO intake, Supplement acceptance, Labs, Weight trends, I & O's  REASON FOR ASSESSMENT:   Consult Assessment of nutrition requirement/status  ASSESSMENT:   79 year old female with PMHx of DM, HTN, hx MI, CHF, CAD, DM, anxiety, depression, CKD, multiple myeloma on chemotherapy admitted after a fall with displaced right femoral neck fracture and nondisplaced right greater trochanteric fracture.   Met with patient at bedside. She reports her appetite and intake are good at baseline. She reports at home she eats "all day long." She is unable to provide specifics on intake and only reports she eats well. She does endorse drinking 2 oral nutrition supplements daily at home but cannot recall brand. Discussed increased nutrient needs for post-operative healing. Patient amenable to drinking oral nutrition supplements after diet is advanced. Patient currently NPO for OR today.  Patient reports she is weight-stable at 175 lbs and she denies any weight loss. Per chart patient is currently 78.5 kg (173.06 lbs). However per chart it appears patient was 191 lbs on 09/01/2019. She has lost approximately 18 lbs (9.4% body weight) over the past 7 months, which is not significant for time frame. Also unsure how much of that weight loss could have been related to fluid.  Medications reviewed and include: Novolog 0-15 units Q4hrs, magnesium oxide 200 mg daily, Protonix, LR at 50 mL/hr.  Labs reviewed: CBG 87-105, Chloride 97, BUN 33, Creatinine  1.37.  Patient does not meet criteria for malnutrition at this time but is at risk for malnutrition.  Patient requested RD ask RN to stop in and see patient as she is not feeling well after her last pain medication. Notified RN of patient's request.  NUTRITION - FOCUSED PHYSICAL EXAM:    Most Recent Value  Orbital Region No depletion  Upper Arm Region Mild depletion  Thoracic and Lumbar Region No depletion  Buccal Region No depletion  Temple Region Mild depletion  Clavicle Bone Region Mild depletion  Clavicle and Acromion Bone Region No depletion  Scapular Bone Region No depletion  Dorsal Hand Mild depletion  Patellar Region No depletion  Anterior Thigh Region No depletion  Posterior Calf Region No depletion  Edema (RD Assessment) None  Hair Reviewed  Eyes Reviewed  Mouth Reviewed  Skin Reviewed  Nails Reviewed     Diet Order:   Diet Order            Diet NPO time specified Except for: Sips with Meds  Diet effective now                EDUCATION NEEDS:   No education needs have been identified at this time  Skin:  Skin Assessment: Reviewed RN Assessment  Last BM:  Unknown  Height:   Ht Readings from Last 1 Encounters:  02/21/20 5' 4"  (1.626 m)   Weight:   Wt Readings from Last 1 Encounters:  03/29/20 78.5 kg   BMI:  Body mass index is 29.71 kg/m.  Estimated Nutritional Needs:   Kcal:  1900-2100  Protein:  95-105 grams  Fluid:  1.8 L/day  Jacklynn Barnacle, MS, RD, LDN Pager number available on Amion

## 2020-03-29 NOTE — Progress Notes (Addendum)
[facility] Cardiology  SUBJECTIVE: Patient laying in bed, complains of right hip pain, but denies chest pain or shortness of breath   Vitals:   03/28/20 2353 03/29/20 0343 03/29/20 0500 03/29/20 0733  BP: (!) 87/54 (!) 88/58  (!) 81/54  Pulse: 76 76  75  Resp: 16 16  15   Temp: 97.9 F (36.6 C) 98 F (36.7 C)  98.3 F (36.8 C)  TempSrc: Oral Oral  Oral  SpO2: 97% 96%  98%  Weight:   78.5 kg      Intake/Output Summary (Last 24 hours) at 03/29/2020 0753 Last data filed at 03/29/2020 0347 Gross per 24 hour  Intake 600 ml  Output 200 ml  Net 400 ml      PHYSICAL EXAM  General: Well developed, well nourished, in no acute distress HEENT:  Normocephalic and atramatic Neck:  No JVD.  Lungs: Clear bilaterally to auscultation and percussion. Heart: HRRR . Normal S1 and S2 without gallops or murmurs.  Abdomen: Bowel sounds are positive, abdomen soft and non-tender  Msk:  Back normal, normal gait. Normal strength and tone for age. Extremities: No clubbing, cyanosis or edema.   Neuro: Alert and oriented X 3. Psych:  Good affect, responds appropriately   LABS: Basic Metabolic Panel: Recent Labs    03/28/20 1435 03/29/20 0611  NA 136 137  K 2.9* 4.1  CL 94* 97*  CO2 32 31  GLUCOSE 95 97  BUN 42* 33*  CREATININE 1.36* 1.37*  CALCIUM 7.5* 8.0*  MG 2.6*  --    Liver Function Tests: Recent Labs    03/28/20 1435  AST 22  ALT 25  ALKPHOS 67  BILITOT 0.8  PROT 5.6*  ALBUMIN 3.3*   No results for input(s): LIPASE, AMYLASE in the last 72 hours. CBC: Recent Labs    03/28/20 1435 03/29/20 0611  WBC 5.7 5.8  NEUTROABS 3.9  --   HGB 8.0* 8.6*  HCT 25.7* 27.4*  MCV 95.2 94.5  PLT 95* 103*   Cardiac Enzymes: No results for input(s): CKTOTAL, CKMB, CKMBINDEX, TROPONINI in the last 72 hours. BNP: Invalid input(s): POCBNP D-Dimer: No results for input(s): DDIMER in the last 72 hours. Hemoglobin A1C: No results for input(s): HGBA1C in the last 72 hours. Fasting Lipid  Panel: No results for input(s): CHOL, HDL, LDLCALC, TRIG, CHOLHDL, LDLDIRECT in the last 72 hours. Thyroid Function Tests: No results for input(s): TSH, T4TOTAL, T3FREE, THYROIDAB in the last 72 hours.  Invalid input(s): FREET3 Anemia Panel: No results for input(s): VITAMINB12, FOLATE, FERRITIN, TIBC, IRON, RETICCTPCT in the last 72 hours.  DG Chest 1 View  Result Date: 03/28/2020 CLINICAL DATA:  Fall.  Landed on her RIGHT side.  RIGHT hip pain. EXAM: CHEST  1 VIEW COMPARISON:  03/15/2020 FINDINGS: Heart is enlarged, stable in configuration. LEFT-sided AICD is unchanged. No focal consolidations or pleural effusions. No pulmonary edema. Atherosclerotic calcification of the thoracic aorta. IMPRESSION: Stable cardiomegaly. Electronically Signed   By: Nolon Nations M.D.   On: 03/28/2020 15:38   CT Head Wo Contrast  Result Date: 03/28/2020 CLINICAL DATA:  Trauma. EXAM: CT HEAD WITHOUT CONTRAST CT CERVICAL SPINE WITHOUT CONTRAST TECHNIQUE: Multidetector CT imaging of the head and cervical spine was performed following the standard protocol without intravenous contrast. Multiplanar CT image reconstructions of the cervical spine were also generated. COMPARISON:  None. FINDINGS: CT HEAD FINDINGS Brain: No evidence of acute large vascular territory infarction, hemorrhage, hydrocephalus, extra-axial collection or mass lesion/mass effect. Remote right frontal infarct with  encephalomalacia. Patchy white matter hypoattenuation, compatible with chronic microvascular ischemic disease. Generalized cerebral atrophy. Vascular: Calcific atherosclerosis. Skull: No acute fracture Sinuses/Orbits: Visualized sinuses are clear. Unremarkable visualized orbits. Other: No mastoid effusions. CT CERVICAL SPINE FINDINGS Alignment: Mild reversal of the normal cervical lordosis, likely positional. Mild anterolisthesis of C3 on C4, likely degenerative given facet arthropathy at this level. Otherwise, no substantial subluxation.  Skull base and vertebrae: No acute fracture. Vertebral body heights are maintained. Soft tissues and spinal canal: No prevertebral fluid or swelling. No visible canal hematoma. Disc levels: Mild multilevel degenerative disc disease. Multilevel facet arthropathy, greatest at C3-C4. Upper chest: Emphysema. Other: Calcific atherosclerosis of the carotids. IMPRESSION: CT head: 1. No evidence of acute intracranial abnormality. 2. Remote right frontal infarct with encephalomalacia. 3. Chronic microvascular ischemic disease. CT cervical spine: No evidence of acute fracture or traumatic malalignment. Electronically Signed   By: Margaretha Sheffield MD   On: 03/28/2020 15:45   CT Cervical Spine Wo Contrast  Result Date: 03/28/2020 CLINICAL DATA:  Trauma. EXAM: CT HEAD WITHOUT CONTRAST CT CERVICAL SPINE WITHOUT CONTRAST TECHNIQUE: Multidetector CT imaging of the head and cervical spine was performed following the standard protocol without intravenous contrast. Multiplanar CT image reconstructions of the cervical spine were also generated. COMPARISON:  None. FINDINGS: CT HEAD FINDINGS Brain: No evidence of acute large vascular territory infarction, hemorrhage, hydrocephalus, extra-axial collection or mass lesion/mass effect. Remote right frontal infarct with encephalomalacia. Patchy white matter hypoattenuation, compatible with chronic microvascular ischemic disease. Generalized cerebral atrophy. Vascular: Calcific atherosclerosis. Skull: No acute fracture Sinuses/Orbits: Visualized sinuses are clear. Unremarkable visualized orbits. Other: No mastoid effusions. CT CERVICAL SPINE FINDINGS Alignment: Mild reversal of the normal cervical lordosis, likely positional. Mild anterolisthesis of C3 on C4, likely degenerative given facet arthropathy at this level. Otherwise, no substantial subluxation. Skull base and vertebrae: No acute fracture. Vertebral body heights are maintained. Soft tissues and spinal canal: No prevertebral  fluid or swelling. No visible canal hematoma. Disc levels: Mild multilevel degenerative disc disease. Multilevel facet arthropathy, greatest at C3-C4. Upper chest: Emphysema. Other: Calcific atherosclerosis of the carotids. IMPRESSION: CT head: 1. No evidence of acute intracranial abnormality. 2. Remote right frontal infarct with encephalomalacia. 3. Chronic microvascular ischemic disease. CT cervical spine: No evidence of acute fracture or traumatic malalignment. Electronically Signed   By: Margaretha Sheffield MD   On: 03/28/2020 15:45   DG Hip Unilat W or Wo Pelvis 2-3 Views Right  Result Date: 03/28/2020 CLINICAL DATA:  Fall EXAM: DG HIP (WITH OR WITHOUT PELVIS) 2-3V RIGHT COMPARISON:  None. FINDINGS: There is a comminuted impacted minimally displaced fracture of the right transcervical femoral neck involving the greater trochanter. The femoral head is still well seated within the acetabulum. Overlying soft tissue swelling is noted. IMPRESSION: Comminuted mildly impacted right transcervical femoral neck fracture involving the greater trochanter. Electronically Signed   By: Prudencio Pair M.D.   On: 03/28/2020 15:38     Echo   TELEMETRY: Sinus rhythm:  ASSESSMENT AND PLAN:  Active Problems:   CHF (congestive heart failure), NYHA class III, chronic, combined (HCC)   Ischemic cardiomyopathy   CKD (chronic kidney disease), stage III (HCC)   Multiple myeloma (HCC)   Hip fracture ([place])    1.  Preoperative cardiovascular evaluation prior to hip surgery.  Patient has history of prior MI, multiple coronary stents, dilated cardiomyopathy, status post ICD, with past history of ICD shocks for VT VF, with COPD, multiple myeloma undergoing chemotherapy, with type 2 diabetes, and chronic  kidney disease.  The patient currently denies chest pain, appears to be euvolemic, does not appear to be in congestive heart failure, with borderline elevated high-sensitivity troponin. Thepatient appears to be at moderate  risk for serious cardiovascular complication during hip surgery but the patient also appears to be medically optimized, no chest pain, signs of congestive heart failure, or recent ICD shocks. 2.  Coronary artery disease, history of prior MI, status post multiple coronary stents, currently chest pain-free, troponin borderline elevated, likely demand supply ischemia in the absence of chest pain or ischemic ECG changes 3.  Dilated cardiomyopathy, status post ICD 4.  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 5.  COPD, on chronic O2, currently denies shortness of breath at rest 6.  Multiple myeloma, on chemotherapy 7.  Chronic anemia 8.  Chronic kidney disease 9.  Right hip fracture awaiting surgery 10.  Hypotension, asymptomatic, likely multifactorial, in the setting of recent diagnosis of multiple myeloma receiving chemotherapy  Recommendations  1.  Agree with current therapy 2.  Proceed with hip surgery per patient's wishes 3.  Defer further cardiac diagnostics at this time 4.  Continue mexiletine pre-, peri and postoperatively for VT/VF prevention 5.  Hold diuretics for now  Isaias Cowman, MD, PhD, [facility] 03/29/2020 7:53 AM

## 2020-03-29 NOTE — Plan of Care (Signed)

## 2020-03-29 NOTE — Anesthesia Preprocedure Evaluation (Signed)
Anesthesia Evaluation  Patient identified by MRN, date of birth, ID band Patient awake    Reviewed: Allergy & Precautions, NPO status , Patient's Chart, lab work & pertinent test results  History of Anesthesia Complications Negative for: history of anesthetic complications  Airway Mallampati: II  TM Distance: >3 FB Neck ROM: Full    Dental  (+) Edentulous Upper, Edentulous Lower   Pulmonary COPD,  oxygen dependent, Current Smoker and Patient abstained from smoking.,    breath sounds clear to auscultation- rhonchi (-) wheezing      Cardiovascular hypertension, Pt. on medications + CAD, + Past MI, + Cardiac Stents and +CHF  + pacemaker + Cardiac Defibrillator  Rhythm:Regular Rate:Normal - Systolic murmurs and - Diastolic murmurs    Neuro/Psych neg Seizures PSYCHIATRIC DISORDERS Anxiety Depression negative neurological ROS     GI/Hepatic negative GI ROS, Neg liver ROS,   Endo/Other  diabetes, Insulin DependentHypothyroidism   Renal/GU CRFRenal disease     Musculoskeletal negative musculoskeletal ROS (+)   Abdominal (+) + obese,   Peds negative pediatric ROS (+)  Hematology  (+) anemia ,   Anesthesia Other Findings Past Medical History: No date: Anxiety No date: Chronic combined systolic and diastolic CHF (congestive  heart failure) (HCC) No date: Chronic kidney disease No date: Coronary artery disease No date: Depression No date: Diabetes mellitus without complication (HCC) No date: Diabetes mellitus, type II (HCC) No date: Hypertension No date: MI (myocardial infarction) ([place])     Comment:  x 5 No date: Pacemaker No date: Prolonged Q-T interval on ECG No date: Thyroid disease   Reproductive/Obstetrics                             Anesthesia Physical  Anesthesia Plan  ASA: IV and emergent  Anesthesia Plan: Spinal   Post-op Pain Management:    Induction: Intravenous  PONV  Risk Score and Plan: 1 and Propofol infusion  Airway Management Planned: Nasal Cannula  Additional Equipment:   Intra-op Plan:   Post-operative Plan:   Informed Consent: I have reviewed the patients History and Physical, chart, labs and discussed the procedure including the risks, benefits and alternatives for the proposed anesthesia with the patient or authorized representative who has indicated his/her understanding and acceptance.     Dental advisory given  Plan Discussed with: CRNA and Anesthesiologist  Anesthesia Plan Comments:         Anesthesia Quick Evaluation

## 2020-03-29 NOTE — Transfer of Care (Signed)
Immediate Anesthesia Transfer of Care Note  Patient: Heather Peterson  Procedure(s) Performed: ARTHROPLASTY BIPOLAR HIP (HEMIARTHROPLASTY) (Right Hip)  Patient Location: PACU  Anesthesia Type:Spinal  Level of Consciousness: drowsy and patient cooperative  Airway & Oxygen Therapy: Patient Spontanous Breathing and Patient connected to face mask oxygen  Post-op Assessment: Report given to RN and Post -op Vital signs reviewed and stable  Post vital signs: Reviewed and stable  Last Vitals:  Vitals Value Taken Time  BP 102/53 03/29/20 1937  Temp    Pulse    Resp 14 03/29/20 1937  SpO2 98 % 03/29/20 1937    Last Pain:  Vitals:   03/29/20 1616  TempSrc: Temporal  PainSc: 5          Complications: No complications documented.

## 2020-03-29 NOTE — Op Note (Signed)
03/29/2020  7:22 PM  Patient:   Heather Peterson  Pre-Op Diagnosis:   Displaced femoral neck fracture, right hip.  Post-Op Diagnosis:   Same.  Procedure:   Right hip unipolar hemiarthroplasty.  Surgeon:   Pascal Lux, MD  Assistant:   Cameron Proud, PA-C; Sherlene Shams, PA-S  Anesthesia:   Spinal  Findings:   As above.  Complications:   None  EBL:   250 cc  Fluids:   600 cc crystalloid  UOP:   700 cc  TT:   None  Drains:   None  Closure:   Staples  Implants:   Biomet press-fit system with a #10 laterally offset reduced proximal profile Echo femoral stem, a 46 mm outer diameter shell, and a +0 mm neck adapter.  Brief Clinical Note:   The patient is a 79 year old female who sustained the above-noted injury yesterday afternoon in her home. Apparently she was going to her bedroom when she became somewhat lightheaded and fell, injuring her right hip. She was brought to the emergency room where x-rays demonstrated the above-noted injury. The patient has been cleared medically and presents at this time for definitive management of the injury.  Procedure:   The patient was brought into the operating room. After adequate spinal anesthesia was obtained, the patient was repositioned in the left lateral decubitus position and secured using a lateral hip positioner. The right hip and lower extremity were prepped with ChloroPrep solution before being draped sterilely. Preoperative antibiotics were administered. A timeout was performed to verify the appropriate surgical site before a standard posterior approach to the hip was made through an approximately 4-5 inch incision. The incision was carried down through the subcutaneous tissues to expose the gluteal fascia and proximal end of the iliotibial band. These structures were split the length of the incision and the Charnley self-retaining hip retractor placed. The bursal tissues were swept posteriorly to expose the short external rotators.  The anterior border of the piriformis tendon was identified and this plane developed down through the capsule to enter the joint. Abundant fracture hematoma was suctioned. A flap of tissue was elevated off the posterior aspect of the femoral neck and greater trochanter and retracted posteriorly. This flap included the piriformis tendon, the short external rotators, and the posterior capsule. The femoral head was removed in its entirety, then taken to the back table where it was measured and found to be optimally replicated by a 46 mm head. The appropriate trial head was inserted and found to demonstrate an excellent suction fit.   Attention was directed to the femoral side. The femoral neck was recut 10-12 mm above the lesser trochanter using an oscillating saw. The piriformis fossa was debrided of soft tissues before the intramedullary canal was accessed through this point using a triple step reamer. The canal was reamed sequentially beginning with a #7 tapered reamer and progressing to a #10 tapered reamer. This provided excellent circumferential chatter. A box osteotome was used to establish version before the canal was broached sequentially beginning with a #8 broach and progressing to a #10 broach. This was left in place and several trial reductions performed. The permanent #10 laterally offset reduced proximal profile femoral stem was impacted into place. A repeat trial reduction was performed using both the -3 mm and +0 mm neck lengths. The +0 mm neck length demonstrated excellent stability both in extension and external rotation as well as with flexion to 90 and internal rotation beyond 70. It also was  stable in the position of sleep. The 46 mm outer diameter shell with the +0 mm neck adapter construct was put together on the back table before being impacted onto the stem of the femoral component. The Morse taper locking mechanism was verified using manual distraction before the head was relocated and  the hip placed through a range of motion with the findings as described above.  The wound was copiously irrigated with bacitracin saline solution via the jet lavage system before the peri-incisional and pericapsular tissues were injected with 30 cc of 0.5% Sensorcaine with epinephrine and 20 cc of Exparel diluted out to 30 cc with normal saline to help with postoperative analgesia. The posterior flap was reapproximated to the posterior aspect of the greater trochanter using #2 Tycron interrupted sutures placed through drill holes. The iliotibial band was reapproximated using #1 Vicryl interrupted sutures before the gluteal fascia was closed using a running #1 Vicryl suture. At this point, 1 g of transexemic acid in 10 cc of normal saline was injected into the joint to help reduce postoperative bleeding. The subcutaneous tissues were closed in several layers using 2-0 Vicryl interrupted sutures before the skin was closed using staples. A sterile occlusive dressing was applied to the wound . The patient then was rolled back into the supine position on the hospital bed before being awakened and returned to the recovery room in satisfactory condition after tolerating the procedure well.

## 2020-03-30 ENCOUNTER — Encounter: Payer: Self-pay | Admitting: Surgery

## 2020-03-30 DIAGNOSIS — S72114A Nondisplaced fracture of greater trochanter of right femur, initial encounter for closed fracture: Secondary | ICD-10-CM | POA: Insufficient documentation

## 2020-03-30 DIAGNOSIS — S72031A Displaced midcervical fracture of right femur, initial encounter for closed fracture: Secondary | ICD-10-CM | POA: Insufficient documentation

## 2020-03-30 LAB — CBC
HCT: 19.8 % — ABNORMAL LOW (ref 36.0–46.0)
Hemoglobin: 6.5 g/dL — ABNORMAL LOW (ref 12.0–15.0)
MCH: 30.4 pg (ref 26.0–34.0)
MCHC: 32.8 g/dL (ref 30.0–36.0)
MCV: 92.5 fL (ref 80.0–100.0)
Platelets: 84 10*3/uL — ABNORMAL LOW (ref 150–400)
RBC: 2.14 MIL/uL — ABNORMAL LOW (ref 3.87–5.11)
RDW: 17.9 % — ABNORMAL HIGH (ref 11.5–15.5)
WBC: 4.7 10*3/uL (ref 4.0–10.5)
nRBC: 0 % (ref 0.0–0.2)

## 2020-03-30 LAB — BASIC METABOLIC PANEL
Anion gap: 10 (ref 5–15)
BUN: 27 mg/dL — ABNORMAL HIGH (ref 8–23)
CO2: 27 mmol/L (ref 22–32)
Calcium: 7.1 mg/dL — ABNORMAL LOW (ref 8.9–10.3)
Chloride: 97 mmol/L — ABNORMAL LOW (ref 98–111)
Creatinine, Ser: 1.41 mg/dL — ABNORMAL HIGH (ref 0.44–1.00)
GFR, Estimated: 38 mL/min — ABNORMAL LOW (ref >60)
Glucose, Bld: 174 mg/dL — ABNORMAL HIGH (ref 70–99)
Potassium: 3.4 mmol/L — ABNORMAL LOW (ref 3.5–5.1)
Sodium: 134 mmol/L — ABNORMAL LOW (ref 135–145)

## 2020-03-30 LAB — GLUCOSE, CAPILLARY
Glucose-Capillary: 173 mg/dL — ABNORMAL HIGH (ref 70–99)
Glucose-Capillary: 108 mg/dL — ABNORMAL HIGH (ref 70–99)
Glucose-Capillary: 167 mg/dL — ABNORMAL HIGH (ref 70–99)
Glucose-Capillary: 435 mg/dL — ABNORMAL HIGH (ref 70–99)
Glucose-Capillary: 73 mg/dL (ref 70–99)
Glucose-Capillary: 76 mg/dL (ref 70–99)

## 2020-03-30 LAB — PREPARE RBC (CROSSMATCH)

## 2020-03-30 LAB — HEMOGLOBIN AND HEMATOCRIT, BLOOD
HCT: 23.5 % — ABNORMAL LOW (ref 36.0–46.0)
Hemoglobin: 7.9 g/dL — ABNORMAL LOW (ref 12.0–15.0)

## 2020-03-30 MED ORDER — SODIUM CHLORIDE 0.9% IV SOLUTION: Freq: Once | INTRAVENOUS | Status: AC

## 2020-03-30 MED ORDER — LEVOTHYROXINE SODIUM 50 MCG PO TABS
50.0000 ug | ORAL_TABLET | ORAL | Status: DC
  Administered 2020-03-30 – 2020-04-02 (×3): 50 ug via ORAL
  Filled 2020-03-30 (×5): qty 1

## 2020-03-30 MED ORDER — POLYETHYLENE GLYCOL 3350 17 G PO PACK
17.0000 g | PACK | Freq: Every day | ORAL | Status: DC
  Administered 2020-03-30 – 2020-04-03 (×4): 17 g via ORAL
  Filled 2020-03-30 (×3): qty 1

## 2020-03-30 MED ORDER — SODIUM CHLORIDE 0.9 % IV BOLUS
500.0000 mL | Freq: Once | INTRAVENOUS | Status: AC
  Administered 2020-03-30: 500 mL via INTRAVENOUS

## 2020-03-30 MED ORDER — FERROUS SULFATE 325 (65 FE) MG PO TABS
325.0000 mg | ORAL_TABLET | Freq: Two times a day (BID) | ORAL | Status: DC
  Administered 2020-03-30 – 2020-04-03 (×9): 325 mg via ORAL
  Filled 2020-03-30 (×9): qty 1

## 2020-03-30 MED ORDER — ALPRAZOLAM 0.5 MG PO TABS
0.5000 mg | ORAL_TABLET | Freq: Every evening | ORAL | Status: DC | PRN
  Administered 2020-03-31 – 2020-04-02 (×2): 0.5 mg via ORAL
  Filled 2020-03-30 (×2): qty 1

## 2020-03-30 MED ORDER — SODIUM CHLORIDE 0.9% IV SOLUTION: Freq: Once | INTRAVENOUS | Status: DC

## 2020-03-30 MED ORDER — LEVOTHYROXINE SODIUM 50 MCG PO TABS
75.0000 ug | ORAL_TABLET | ORAL | Status: DC
  Administered 2020-03-31 – 2020-04-03 (×2): 75 ug via ORAL
  Filled 2020-03-30: qty 1

## 2020-03-30 NOTE — Progress Notes (Signed)
[facility] Cardiology  SUBJECTIVE: Patient laying in bed, complains of right hip pain, denies chest pain or shortness of breath   Vitals:   03/30/20 0403 03/30/20 0443 03/30/20 0458 03/30/20 0720  BP: (!) 89/51 (!) 88/54 (!) 81/57 (!) 85/54  Pulse: 72 72 68 72  Resp: 16 16 16 16   Temp: 98.2 F (36.8 C) 98.2 F (36.8 C) 98 F (36.7 C) 97.9 F (36.6 C)  TempSrc: Oral Oral  Oral  SpO2: 94%   95%  Weight:      Height:         Intake/Output Summary (Last 24 hours) at 03/30/2020 0801 Last data filed at 03/30/2020 0720 Gross per 24 hour  Intake 3675.13 ml  Output 1130 ml  Net 2545.13 ml      PHYSICAL EXAM  General: Well developed, well nourished, in no acute distress HEENT:  Normocephalic and atramatic Neck:  No JVD.  Lungs: Clear bilaterally to auscultation and percussion. Heart: HRRR . Normal S1 and S2 without gallops or murmurs.  Abdomen: Bowel sounds are positive, abdomen soft and non-tender  Msk:  Back normal, normal gait. Normal strength and tone for age. Extremities: No clubbing, cyanosis or edema.   Neuro: Alert and oriented X 3. Psych:  Good affect, responds appropriately   LABS: Basic Metabolic Panel: Recent Labs    03/28/20 1435 03/28/20 1435 03/29/20 0611 03/30/20 0239  NA 136   < > 137 134*  K 2.9*   < > 4.1 3.4*  CL 94*   < > 97* 97*  CO2 32   < > 31 27  GLUCOSE 95   < > 97 174*  BUN 42*   < > 33* 27*  CREATININE 1.36*   < > 1.37* 1.41*  CALCIUM 7.5*   < > 8.0* 7.1*  MG 2.6*  --   --   --    < > = values in this interval not displayed.   Liver Function Tests: Recent Labs    03/28/20 1435  AST 22  ALT 25  ALKPHOS 67  BILITOT 0.8  PROT 5.6*  ALBUMIN 3.3*   No results for input(s): LIPASE, AMYLASE in the last 72 hours. CBC: Recent Labs    03/28/20 1435 03/28/20 1435 03/29/20 0611 03/29/20 0611 03/29/20 2005 03/30/20 0239  WBC 5.7   < > 5.8  --   --  4.7  NEUTROABS 3.9  --   --   --   --   --   HGB 8.0*   < > 8.6*   < > 6.2* 6.5*   HCT 25.7*   < > 27.4*   < > 20.2* 19.8*  MCV 95.2   < > 94.5  --   --  92.5  PLT 95*   < > 103*  --   --  84*   < > = values in this interval not displayed.   Cardiac Enzymes: No results for input(s): CKTOTAL, CKMB, CKMBINDEX, TROPONINI in the last 72 hours. BNP: Invalid input(s): POCBNP D-Dimer: No results for input(s): DDIMER in the last 72 hours. Hemoglobin A1C: Recent Labs    03/29/20 0611  HGBA1C 5.9*   Fasting Lipid Panel: No results for input(s): CHOL, HDL, LDLCALC, TRIG, CHOLHDL, LDLDIRECT in the last 72 hours. Thyroid Function Tests: No results for input(s): TSH, T4TOTAL, T3FREE, THYROIDAB in the last 72 hours.  Invalid input(s): FREET3 Anemia Panel: No results for input(s): VITAMINB12, FOLATE, FERRITIN, TIBC, IRON, RETICCTPCT in the last 72 hours.  DG  Chest 1 View  Result Date: 03/28/2020 CLINICAL DATA:  Fall.  Landed on her RIGHT side.  RIGHT hip pain. EXAM: CHEST  1 VIEW COMPARISON:  03/15/2020 FINDINGS: Heart is enlarged, stable in configuration. LEFT-sided AICD is unchanged. No focal consolidations or pleural effusions. No pulmonary edema. Atherosclerotic calcification of the thoracic aorta. IMPRESSION: Stable cardiomegaly. Electronically Signed   By: Nolon Nations M.D.   On: 03/28/2020 15:38   CT Head Wo Contrast  Result Date: 03/28/2020 CLINICAL DATA:  Trauma. EXAM: CT HEAD WITHOUT CONTRAST CT CERVICAL SPINE WITHOUT CONTRAST TECHNIQUE: Multidetector CT imaging of the head and cervical spine was performed following the standard protocol without intravenous contrast. Multiplanar CT image reconstructions of the cervical spine were also generated. COMPARISON:  None. FINDINGS: CT HEAD FINDINGS Brain: No evidence of acute large vascular territory infarction, hemorrhage, hydrocephalus, extra-axial collection or mass lesion/mass effect. Remote right frontal infarct with encephalomalacia. Patchy white matter hypoattenuation, compatible with chronic microvascular ischemic  disease. Generalized cerebral atrophy. Vascular: Calcific atherosclerosis. Skull: No acute fracture Sinuses/Orbits: Visualized sinuses are clear. Unremarkable visualized orbits. Other: No mastoid effusions. CT CERVICAL SPINE FINDINGS Alignment: Mild reversal of the normal cervical lordosis, likely positional. Mild anterolisthesis of C3 on C4, likely degenerative given facet arthropathy at this level. Otherwise, no substantial subluxation. Skull base and vertebrae: No acute fracture. Vertebral body heights are maintained. Soft tissues and spinal canal: No prevertebral fluid or swelling. No visible canal hematoma. Disc levels: Mild multilevel degenerative disc disease. Multilevel facet arthropathy, greatest at C3-C4. Upper chest: Emphysema. Other: Calcific atherosclerosis of the carotids. IMPRESSION: CT head: 1. No evidence of acute intracranial abnormality. 2. Remote right frontal infarct with encephalomalacia. 3. Chronic microvascular ischemic disease. CT cervical spine: No evidence of acute fracture or traumatic malalignment. Electronically Signed   By: Margaretha Sheffield MD   On: 03/28/2020 15:45   CT Cervical Spine Wo Contrast  Result Date: 03/28/2020 CLINICAL DATA:  Trauma. EXAM: CT HEAD WITHOUT CONTRAST CT CERVICAL SPINE WITHOUT CONTRAST TECHNIQUE: Multidetector CT imaging of the head and cervical spine was performed following the standard protocol without intravenous contrast. Multiplanar CT image reconstructions of the cervical spine were also generated. COMPARISON:  None. FINDINGS: CT HEAD FINDINGS Brain: No evidence of acute large vascular territory infarction, hemorrhage, hydrocephalus, extra-axial collection or mass lesion/mass effect. Remote right frontal infarct with encephalomalacia. Patchy white matter hypoattenuation, compatible with chronic microvascular ischemic disease. Generalized cerebral atrophy. Vascular: Calcific atherosclerosis. Skull: No acute fracture Sinuses/Orbits: Visualized sinuses  are clear. Unremarkable visualized orbits. Other: No mastoid effusions. CT CERVICAL SPINE FINDINGS Alignment: Mild reversal of the normal cervical lordosis, likely positional. Mild anterolisthesis of C3 on C4, likely degenerative given facet arthropathy at this level. Otherwise, no substantial subluxation. Skull base and vertebrae: No acute fracture. Vertebral body heights are maintained. Soft tissues and spinal canal: No prevertebral fluid or swelling. No visible canal hematoma. Disc levels: Mild multilevel degenerative disc disease. Multilevel facet arthropathy, greatest at C3-C4. Upper chest: Emphysema. Other: Calcific atherosclerosis of the carotids. IMPRESSION: CT head: 1. No evidence of acute intracranial abnormality. 2. Remote right frontal infarct with encephalomalacia. 3. Chronic microvascular ischemic disease. CT cervical spine: No evidence of acute fracture or traumatic malalignment. Electronically Signed   By: Margaretha Sheffield MD   On: 03/28/2020 15:45   DG HIP UNILAT W OR W/O PELVIS 2-3 VIEWS RIGHT  Result Date: 03/29/2020 CLINICAL DATA:  Postop from right hip arthroplasty. EXAM: DG HIP (WITH OR WITHOUT PELVIS) 2-3V RIGHT COMPARISON:  None. FINDINGS: A right  hip prosthesis is seen in expected position. No evidence of fracture or dislocation. IMPRESSION: Expected postoperative appearance of right hip prosthesis. Electronically Signed   By: Marlaine Hind M.D.   On: 03/29/2020 20:20   DG Hip Unilat W or Wo Pelvis 2-3 Views Right  Result Date: 03/28/2020 CLINICAL DATA:  Fall EXAM: DG HIP (WITH OR WITHOUT PELVIS) 2-3V RIGHT COMPARISON:  None. FINDINGS: There is a comminuted impacted minimally displaced fracture of the right transcervical femoral neck involving the greater trochanter. The femoral head is still well seated within the acetabulum. Overlying soft tissue swelling is noted. IMPRESSION: Comminuted mildly impacted right transcervical femoral neck fracture involving the greater trochanter.  Electronically Signed   By: Prudencio Pair M.D.   On: 03/28/2020 15:38     Echo   TELEMETRY: Sinus rhythm at 73 bpm:  ASSESSMENT AND PLAN:  Active Problems:   CHF (congestive heart failure), NYHA class III, chronic, combined (HCC)   Ischemic cardiomyopathy   CKD (chronic kidney disease), stage III (HCC)   Multiple myeloma (HCC)   Hip fracture ([place])    1.  Right hip fracture, status post right hip hemiarthroplasty without complication.  Patient denies chest pain or shortness of breath.  Telemetry reveals sinus rhythm. 2.Coronary artery disease,history of prior MI, status post multiple coronary stents, currently chest pain-free, troponin borderline elevated, likely demand supply ischemia in the absence of chest pain or ischemic ECG changes 3.Dilated cardiomyopathy, status post ICD 4.History of VT/VF, status post ICD shocks 02/04/2017, 03/04/2017, been stable on mexiletine with most recent ICD interrogation 05/19/2019 revealing no delivered shocks. 5.COPD, on chronic O2, currently denies shortness of breath at rest 6.Multiple myeloma, on chemotherapy 7.Chronic anemia 8.Chronic kidney disease, renal function stable following surgery 9.  Hypotension, asymptomatic, likely multifactorial, in the setting of recent diagnosis of multiple myeloma receiving chemotherapy, exacerbated by anemia 10.  Anemia, hemoglobin hematocrit 6.5 and 19.8, respectively, following hip surgery, receiving blood transfusion  Recommendations  1.  Agree with current therapy 2.  Continue mexiletine 3.  Hold diuretics for now 4.  Defer further cardiac diagnostics at this time   Sign off for now, please call if any questions   Isaias Cowman, MD, PhD, [facility] 03/30/2020 8:01 AM

## 2020-03-30 NOTE — Progress Notes (Signed)
Physical Therapy Treatment Patient Details Name: MALEA SWILLING MRN: [id number] DOB: May 08, 1941 Today's Date: 03/30/2020    History of Present Illness 79 y.o. 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 Grayland Ormond) presents s/p fall, R hip fracture.  Now s/p R total hip, posterior approach.    PT Comments    Pt back in bed (recently assisted by nursing) and not at all interested in trying to get up again today.  She did participate with some supine exercises but was weak and pain limited with most of them.  Pt states frustration in her current state, encouraged to work hard to get back some function.2  Follow Up Recommendations  SNF     Equipment Recommendations   (TBD)    Recommendations for Other Services       Precautions / Restrictions Precautions Precautions: Fall;Posterior Hip Restrictions Weight Bearing Restrictions: Yes RLE Weight Bearing: Weight bearing as tolerated    Mobility  Bed Mobility Overal bed mobility: Needs Assistance Bed Mobility: Supine to Sit     Supine to sit: Mod assist     General bed mobility comments:  (just recently gotten back to bed with nursing, deferred)  Transfers Overall transfer level: Needs assistance Equipment used: Rolling walker (2 wheeled) Transfers: Sit to/from Stand Sit to Stand: Min assist         General transfer comment: Pt was able to rise to standing but could not get weight forward/up w/o some direct assist  Ambulation/Gait Ambulation/Gait assistance: Min assist Gait Distance (Feet): 3 Feet Assistive device: Rolling walker (2 wheeled)       General Gait Details: Pt was able to turn and take a few very small steps, but ultimately struggled with weight shifts, ability to maintain any consistency.  Fatigued with the effort.   Stairs             Wheelchair Mobility    Modified Rankin (Stroke Patients Only)        Balance Overall balance assessment: Needs assistance Sitting-balance support: Bilateral upper extremity supported Sitting balance-Leahy Scale: Fair     Standing balance support: Bilateral upper extremity supported Standing balance-Leahy Scale: Poor Standing balance comment: reliant on the walker, unable to orgainze any type of consistency with ambulation                            Cognition Arousal/Alertness: Awake/alert Behavior During Therapy: WFL for tasks assessed/performed Overall Cognitive Status: Within Functional Limits for tasks assessed                                        Exercises Total Joint Exercises Ankle Circles/Pumps: Strengthening;10 reps Quad Sets: Strengthening;10 reps Heel Slides: AAROM;10 reps (with resisted leg ext) Hip ABduction/ADduction: 10 reps;AROM;AAROM Straight Leg Raises: AAROM;5 reps (unable)    General Comments General comments (skin integrity, edema, etc.): BP is chronically low, 92/49 at start of session.  No c/o lightheadedness t/o transitions, etc      Pertinent Vitals/Pain Pain Assessment: 0-10 Pain Score: 6  Pain Location: R hip    Home Living Family/patient expects to be discharged to:: Private residence Living Arrangements: Spouse/significant other;Children Available Help at Discharge: Family;Available 24 hours/day Type of Home: House Home Access: Stairs to enter Entrance Stairs-Rails: None Home Layout: One level Home Equipment: Environmental consultant -  4 wheels;Cane - single point;Shower seat;Grab bars - tub/shower      Prior Function Level of Independence: Independent      Comments: no AD, but admits using furniture, countertops, etc   PT Goals (current goals can now be found in the care plan section) Acute Rehab PT Goals Patient Stated Goal: get back to walking w/o a walker PT Goal Formulation: With patient Time For Goal Achievement: 04/13/20 Potential to Achieve Goals: Fair Progress towards PT  goals: Progressing toward goals    Frequency    BID      PT Plan Current plan remains appropriate    Co-evaluation              AM-PAC PT "6 Clicks" Mobility   Outcome Measure  Help needed turning from your back to your side while in a flat bed without using bedrails?: A Lot Help needed moving from lying on your back to sitting on the side of a flat bed without using bedrails?: A Lot Help needed moving to and from a bed to a chair (including a wheelchair)?: A Lot Help needed standing up from a chair using your arms (e.g., wheelchair or bedside chair)?: A Little Help needed to walk in hospital room?: Total Help needed climbing 3-5 steps with a railing? : Total 6 Click Score: 11    End of Session Equipment Utilized During Treatment: Oxygen Activity Tolerance: Patient limited by fatigue Patient left: with chair alarm set;with call bell/phone within reach Nurse Communication: Mobility status PT Visit Diagnosis: Muscle weakness (generalized) (M62.81);Difficulty in walking, not elsewhere classified (R26.2);Pain Pain - Right/Left: Right Pain - part of body: Hip     Time: 9417-9199 PT Time Calculation (min) (ACUTE ONLY): 18 min  Charges:  $Gait Training: 8-22 mins $Therapeutic Exercise: 8-22 mins                     Kreg Shropshire, DPT 03/30/2020, 3:46 PM

## 2020-03-30 NOTE — Progress Notes (Signed)
PT Cancellation Note  Patient Details Name: Heather Peterson MRN: [id number] DOB: 24-Feb-1941   Cancelled Treatment:    Reason Eval/Treat Not Completed: Patient not medically ready Pt has had low Hgb levels, most recently <7, is post transfusion now.   Additionally BP has been low, 80s/50s most recent measures - though apparently this is somewhat normal for her and likely associated with multiple myloma. Awaiting lab work to assess need for more blood; hope to see her later today per medical picture.  Spoke with pt who reports that she did sit up last night but that it was extremely painful.  Kreg Shropshire, DPT 03/30/2020, 9:26 AM

## 2020-03-30 NOTE — Progress Notes (Signed)
PROGRESS NOTE    Heather Peterson  NWG:[id number] DOB: 04-22-1941 DOA: 03/28/2020 PCP: [name], MD   Brief Narrative: Taken from H&P  Heather Peterson is a 79 y.o. female with medical history significant ofCAD, combined CHF, CKD, DM, hypothyroid disease, 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 (sees Dr. Grayland Ormond) presents s/p fall, resulted in comminuted mildly impacted right femoral neck fracture involving the greater trochanter. Orthopedic was consulted-going to OR today.  Subjective: Patient was complaining of 5/5 right hip and back pain when seen today. She appears comfortable and eating breakfast. Denies any chest pain or shortness of breath.  Assessment & Plan:   Active Problems:   CHF (congestive heart failure), NYHA class III, chronic, combined (HCC)   Ischemic cardiomyopathy   CKD (chronic kidney disease), stage III (HCC)   Multiple myeloma (HCC)   Hip fracture (HCC)  Right femoral neck fracture secondary to fall. S/p hemiarthroplasty.  There was some concern of hypotension as she was having some dizziness.  No loss of consciousness.  Cardiology was consulted for preop clearance which was done and she went to the OR for hemiarthroplasty yesterday. Cardiology signed off. -Continue with pain management. -PT evaluation -recommending SNF placement. -TOC to work on it  History of HFrEF.  Patient appears euvolemic.  EF of 35 to 40% according to the echo done in 2018.  Cardiology was consulted for preop clearance. -Holding home dose of diuretic due to concern of postural hypotension. -Patient appears euvolemic but did receive 2 units of PRBC. -Monitor intake and output. -Can give Lasix if needed.  Postoperative blood loss anemia. Hemoglobin dropped to 6.2 postoperatively secondary to blood loss. She received 2 units of PRBC. Posttransfusion hemoglobin was 7.9. -Continue to monitor. -Transfuse if below 7 -Start her on iron supplement  Hypotension.  Unable to check  orthostatic vitals. -Continue home dose of midodrine. -Careful with pain medications.  CKD stage IIIb.  Baseline creatinine around 1.6.  Currently at 1.41. -Continue to monitor. -Avoid nephrotoxins  Multiple myeloma.  On chemotherapy. -Being managed by outpatient oncology.  Insulin-dependent type 2 diabetes mellitus.  Patient was on Lantus at home. -Continue with SSI  Objective: Vitals:   03/30/20 0458 03/30/20 0720 03/30/20 1131 03/30/20 1539  BP: (!) 81/57 (!) 85/54 (!) 89/55 (!) 77/47  Pulse: 68 72 69 75  Resp: _0 Temp: 98 F (36.7 C) 97.9 F (36.6 C) 97.6 F (36.4 C) 97.9 F (36.6 C)  TempSrc:  Oral Oral Oral  SpO2:  95% 99% 98%  Weight:      Height:        Intake/Output Summary (Last 24 hours) at 03/30/2020 1644 Last data filed at 03/30/2020 1504 Gross per 24 hour  Intake 3068.23 ml  Output 1130 ml  Net 1938.23 ml   Filed Weights   03/29/20 0500 03/29/20 1616  Weight: 78.5 kg 78.5 kg    Examination:  General. Well-developed elderly lady, in no acute distress. Pulmonary.  Lungs clear bilaterally, normal respiratory effort. CV.  Regular rate and rhythm, no JVD, rub or murmur. Abdomen.  Soft, nontender, nondistended, BS positive. CNS.  Alert and oriented x3.  No focal neurologic deficit. Extremities.  No edema, no cyanosis, pulses intact and symmetrical, right hip with clean bandage and ice pack. Psychiatry.  Judgment and insight appears normal.  DVT prophylaxis: SCDs Code Status: Full Family Communication: Discussed with patient Disposition Plan:  Status is: Inpatient  Remains inpatient appropriate because:Inpatient level of care appropriate due to severity of  illness   Dispo: The patient is from: Home              Anticipated d/c is to: To be determined              Anticipated d/c date is: 2 days              Patient currently is not medically stable to d/c.  Consultants:   Cardiology  Orthopedic  Procedures:  Antimicrobials:    Data Reviewed: I have personally reviewed following labs and imaging studies  CBC: Recent Labs  Lab 03/28/20 1435 03/29/20 0611 03/29/20 2005 03/30/20 0239 03/30/20 0845  WBC 5.7 5.8  --  4.7  --   NEUTROABS 3.9  --   --   --   --   HGB 8.0* 8.6* 6.2* 6.5* 7.9*  HCT 25.7* 27.4* 20.2* 19.8* 23.5*  MCV 95.2 94.5  --  92.5  --   PLT 95* 103*  --  84*  --    Basic Metabolic Panel: Recent Labs  Lab 03/28/20 1435 03/29/20 0611 03/30/20 0239  NA 136 137 134*  K 2.9* 4.1 3.4*  CL 94* 97* 97*  CO2 32 31 27  GLUCOSE 95 97 174*  BUN 42* 33* 27*  CREATININE 1.36* 1.37* 1.41*  CALCIUM 7.5* 8.0* 7.1*  MG 2.6*  --   --    GFR: Estimated Creatinine Clearance: 33.3 mL/min (A) (by C-G formula based on SCr of 1.41 mg/dL (H)). Liver Function Tests: Recent Labs  Lab 03/28/20 1435  AST 22  ALT 25  ALKPHOS 67  BILITOT 0.8  PROT 5.6*  ALBUMIN 3.3*   No results for input(s): LIPASE, AMYLASE in the last 168 hours. No results for input(s): AMMONIA in the last 168 hours. Coagulation Profile: Recent Labs  Lab 03/28/20 1609 03/29/20 0902  INR 1.3* 1.3*   Cardiac Enzymes: No results for input(s): CKTOTAL, CKMB, CKMBINDEX, TROPONINI in the last 168 hours. BNP (last 3 results) No results for input(s): PROBNP in the last 8760 hours. HbA1C: Recent Labs    03/29/20 0611  HGBA1C 5.9*   CBG: Recent Labs  Lab 03/29/20 2341 03/30/20 0249 03/30/20 0926 03/30/20 1132 03/30/20 1543  GLUCAP 123* 173* 108* 167* 435*   Lipid Profile: No results for input(s): CHOL, HDL, LDLCALC, TRIG, CHOLHDL, LDLDIRECT in the last 72 hours. Thyroid Function Tests: No results for input(s): TSH, T4TOTAL, FREET4, T3FREE, THYROIDAB in the last 72 hours. Anemia Panel: No results for input(s): VITAMINB12, FOLATE, FERRITIN, TIBC, IRON, RETICCTPCT in the last 72 hours. Sepsis Labs: No results for input(s): PROCALCITON, LATICACIDVEN in the last 168 hours.  Recent Results (from the past 240 hour(s))   Respiratory Panel by RT PCR (Flu A&B, Covid) - Nasopharyngeal Swab     Status: None   Collection Time: 03/28/20  3:33 PM   Specimen: Nasopharyngeal Swab  Result Value Ref Range Status   SARS Coronavirus 2 by RT PCR NEGATIVE NEGATIVE Final    Comment: (NOTE) SARS-CoV-2 target nucleic acids are NOT DETECTED.  The SARS-CoV-2 RNA is generally detectable in upper respiratoy specimens during the acute phase of infection. The lowest concentration of SARS-CoV-2 viral copies this assay can detect is 131 copies/mL. A negative result does not preclude SARS-Cov-2 infection and should not be used as the sole basis for treatment or other patient management decisions. A negative result may occur with  improper specimen collection/handling, submission of specimen other than nasopharyngeal swab, presence of viral mutation(s) within the areas targeted  by this assay, and inadequate number of viral copies (<131 copies/mL). A negative result must be combined with clinical observations, patient history, and epidemiological information. The expected result is Negative.  Fact Sheet for Patients:  PinkCheek.be  Fact Sheet for Healthcare Providers:  GravelBags.it  This test is no t yet approved or cleared by the [place] FDA and  has been authorized for detection and/or diagnosis of SARS-CoV-2 by FDA under an Emergency Use Authorization (EUA). This EUA will remain  in effect (meaning this test can be used) for the duration of the COVID-19 declaration under Section 564(b)(1) of the Act, 21 U.S.C. section 360bbb-3(b)(1), unless the authorization is terminated or revoked sooner.     Influenza A by PCR NEGATIVE NEGATIVE Final   Influenza B by PCR NEGATIVE NEGATIVE Final    Comment: (NOTE) The Xpert Xpress SARS-CoV-2/FLU/RSV assay is intended as an aid in  the diagnosis of influenza from Nasopharyngeal swab specimens and  should not be used as  a sole basis for treatment. Nasal washings and  aspirates are unacceptable for Xpert Xpress SARS-CoV-2/FLU/RSV  testing.  Fact Sheet for Patients: PinkCheek.be  Fact Sheet for Healthcare Providers: GravelBags.it  This test is not yet approved or cleared by the [place] FDA and  has been authorized for detection and/or diagnosis of SARS-CoV-2 by  FDA under an Emergency Use Authorization (EUA). This EUA will remain  in effect (meaning this test can be used) for the duration of the  Covid-19 declaration under Section 564(b)(1) of the Act, 21  U.S.C. section 360bbb-3(b)(1), unless the authorization is  terminated or revoked. Performed at [facility], [address] [place], [place] [address]      Radiology Studies: DG HIP [facility] OR W/O PELVIS 2-3 VIEWS RIGHT  Result Date: 03/29/2020 CLINICAL DATA:  Postop from right hip arthroplasty. EXAM: DG HIP (WITH OR WITHOUT PELVIS) 2-3V RIGHT COMPARISON:  None. FINDINGS: A right hip prosthesis is seen in expected position. No evidence of fracture or dislocation. IMPRESSION: Expected postoperative appearance of right hip prosthesis. Electronically Signed   By: Marlaine Hind M.D.   On: 03/29/2020 20:20    Scheduled Meds: . sodium chloride   Intravenous Once  . acetaminophen  500 mg Oral Q6H  . allopurinol  50 mg Oral Daily  . aspirin EC  81 mg Oral QHS  . Chlorhexidine Gluconate Cloth  6 each Topical Daily  . docusate sodium  100 mg Oral BID  . enoxaparin (LOVENOX) injection  40 mg Subcutaneous Q24H  . insulin aspart  0-15 Units Subcutaneous Q4H  . levothyroxine  50 mcg Oral Once per day on Sun Tue Thu Sat   And  . [START ON 03/31/2020] levothyroxine  75 mcg Oral Once per day on Mon Wed Fri  . magnesium oxide  200 mg Oral Daily  . mexiletine  200 mg Oral Q12H  . midodrine  10 mg Oral BID WC  . multivitamin with minerals  1 tablet Oral Daily  . pantoprazole  40 mg  Oral Daily  . polyethylene glycol  17 g Oral Daily  . Ensure Max Protein  11 oz Oral BID BM  . simvastatin  20 mg Oral QHS  . traZODone  50 mg Oral QHS   Continuous Infusions: .  ceFAZolin (ANCEF) IV Stopped (03/30/20 1320)  . dextrose 5% lactated ringers Stopped (03/30/20 1413)     LOS: 2 days   Time spent: 30 minutes.  [name], MD [facility]  If 7PM-7AM,  please contact night-coverage [email or web address]  03/30/2020, 4:44 PM   This record has been created using Systems analyst. Errors have been sought and corrected,but may not always be located. Such creation errors do not reflect on the standard of care.

## 2020-03-30 NOTE — TOC Initial Note (Signed)
Transition of Care [facility]) - Initial/Assessment Note    Patient Details  Name: Heather Peterson MRN: [id number] Date of Birth: 1941/04/09  Transition of Care [facility]) CM/SW Contact:    Shelbie Ammons, RN Phone Number: 03/30/2020, 3:58 PM  Clinical Narrative:  RNCM met with patient and family at bedside. Patient reports that she is feeling somewhat better today. She reports that she really wants to go home but that she understands that she may need to go to a facility however she continues to report going home is her main goal. After some discussion patient is agreeable to this CM starting a bed search, she does not have preference as to who the requests are sent to. Patient reports that she has had [facility] in the past and would be agreeable to this if she is able to go home.  RNCM completed PASSR, FL-2 and started bed search.                Expected Discharge Plan: Skilled Nursing Facility Barriers to Discharge: Continued Medical Work up   Patient Goals and CMS Choice        Expected Discharge Plan and Services Expected Discharge Plan: [place] Acute Care Choice: [place] Living arrangements for the past 2 months: Single Family Home                                      Prior Living Arrangements/Services Living arrangements for the past 2 months: Single Family Home Lives with:: Spouse, Adult Children Patient language and need for interpreter reviewed:: Yes Do you feel safe going back to the place where you live?: Yes      Need for Family Participation in Patient Care: Yes (Comment) Care giver support system in place?: Yes (comment)   Criminal Activity/Legal Involvement Pertinent to Current Situation/Hospitalization: No - Comment as needed  Activities of Daily Living Home Assistive Devices/Equipment: None ADL Screening (condition at time of admission) Patient's cognitive ability adequate to safely complete daily activities?: Yes Is  the patient deaf or have difficulty hearing?: No Does the patient have difficulty seeing, even when wearing glasses/contacts?: No Does the patient have difficulty concentrating, remembering, or making decisions?: No Patient able to express need for assistance with ADLs?: Yes Does the patient have difficulty dressing or bathing?: No Independently performs ADLs?: Yes (appropriate for developmental age) Does the patient have difficulty walking or climbing stairs?: Yes Weakness of Legs: Both Weakness of Arms/Hands: Both  Permission Sought/Granted                  Emotional Assessment Appearance:: Appears stated age Attitude/Demeanor/Rapport: Engaged Affect (typically observed): Appropriate, Calm Orientation: : Oriented to Self, Oriented to Place, Oriented to  Time, Oriented to Situation Alcohol / Substance Use: Not Applicable Psych Involvement: No (comment)  Admission diagnosis:  Hypokalemia [E87.6] Hip fracture ([place]) [S72.009A] Troponin I above reference range [R77.8] Fall, initial encounter [W19.XXXA] Closed fracture of right hip, initial encounter ([place]) [S72.001A] Syncope, unspecified syncope type [R55] Patient Active Problem List   Diagnosis Date Noted  . Hip fracture ([place]) 03/28/2020  . Multiple myeloma ([place]) 03/01/2020  . Goals of care, counseling/discussion 03/01/2020  . Acute blood loss anemia   . Gastrointestinal hemorrhage   . UGI bleed 09/04/2019  . Dehydration   . Anemia in chronic kidney disease (CKD) 07/04/2019  . CKD (chronic kidney disease), stage  III ([place]) 07/04/2019  . Chronic respiratory failure with hypoxia ([place]) 07/03/2019  . Hyponatremia 07/03/2019  . Prolonged Q-T interval on ECG   . Weakness   . Anemia   . MDD (major depressive disorder), recurrent episode, mild ([place]) 12/14/2018  . Tobacco use disorder 12/14/2018  . At risk for prolonged QT interval syndrome 12/14/2018  . 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 Pedal edema 02/19/2018   . SOB (shortness of breath) on exertion 02/19/2018  . Left main coronary artery disease 03/07/2017  . 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[place]) 03/06/2017  . Ventricular tachycardia ([place]) 03/05/2017  . Defibrillator discharge 03/04/2017  . CHF (congestive heart failure), NYHA class III, chronic, combined ([place]) 03/04/2017  . COPD (chronic obstructive pulmonary disease) ([place]) 02/10/2017  . Diabetes mellitus type 2, uncomplicated ([place]) 15/99/6895  . H/O ventricular fibrillation 02/10/2017  . Hyperlipidemia 02/10/2017  . 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 Ischemic cardiomyopathy 02/10/2017  . Myocardial infarction ([place]) 02/10/2017  . Moderate mitral regurgitation 02/10/2017  . Syncope and collapse 02/04/2017  . CAD (coronary artery disease) 02/04/2017  . HTN (hypertension) 02/04/2017  . Diabetes ([place]) 02/04/2017   PCP:  [name], MD Pharmacy:   [facility] [phone] [address], [place] AT [address] [place] [address] Phone: [phone] Fax: [phone]  [facility], [place] - [address] [place] [address] Phone: [phone] Fax: [phone]     Social Determinants of Health ([place]) Interventions    Readmission Risk Interventions Readmission Risk Prevention Plan 09/06/2019  Transportation Screening Complete  Medication Review ([place]) Complete  PCP or Specialist appointment within 3-5 days of discharge Complete  HRI or [place] Complete  SW Recovery Care/Counseling Consult Complete  Assumption Not Applicable  Some recent data might be hidden

## 2020-03-30 NOTE — Progress Notes (Signed)
Subjective: 1 Day Post-Op Procedure(s) (LRB): ARTHROPLASTY BIPOLAR HIP (HEMIARTHROPLASTY) (Right) Patient reports pain as mild.   Patient is wel, s/p two transfusions.  Hg improved. PT and care management to assist with d/c planning, will likely need SNF. Reports no significant pain in the right hip but does have low back pain this AM. Negative for chest pain and shortness of breath Fever: no Gastrointestinal:Negative for nausea and vomiting  Objective: Vital signs in last 24 hours: Temp:  [97.3 F (36.3 C)-98.6 F (37 C)] 97.9 F (36.6 C) (11/18 0720) Pulse Rate:  [50-79] 72 (11/18 0720) Resp:  [10-20] 16 (11/18 0720) BP: (81-107)/(46-69) 85/54 (11/18 0720) SpO2:  [94 %-100 %] 95 % (11/18 0720) Weight:  [78.5 kg] 78.5 kg (11/17 1616)  Intake/Output from previous day:  Intake/Output Summary (Last 24 hours) at 03/30/2020 1108 Last data filed at 03/30/2020 0720 Gross per 24 hour  Intake 3675.13 ml  Output 1130 ml  Net 2545.13 ml    Intake/Output this shift: Total I/O In: 330 [Blood:330] Out: -   Labs: Recent Labs    03/28/20 1435 03/29/20 0611 03/29/20 2005 03/30/20 0239 03/30/20 0845  HGB 8.0* 8.6* 6.2* 6.5* 7.9*   Recent Labs    03/29/20 0611 03/29/20 2005 03/30/20 0239 03/30/20 0845  WBC 5.8  --  4.7  --   RBC 2.90*  --  2.14*  --   HCT 27.4*   < > 19.8* 23.5*  PLT 103*  --  84*  --    < > = values in this interval not displayed.   Recent Labs    03/29/20 0611 03/30/20 0239  NA 137 134*  K 4.1 3.4*  CL 97* 97*  CO2 31 27  BUN 33* 27*  CREATININE 1.37* 1.41*  GLUCOSE 97 174*  CALCIUM 8.0* 7.1*   Recent Labs    03/28/20 1609 03/29/20 0902  INR 1.3* 1.3*     EXAM General - Patient is Alert, Appropriate and Oriented Extremity - ABD soft Sensation intact distally Intact pulses distally Dorsiflexion/Plantar flexion intact Incision: dressing C/D/I No cellulitis present Dressing/Incision - clean, dry, no drainage Motor Function -  intact, moving foot and toes well on exam.   Past Medical History:  Diagnosis Date  . Anxiety   . Chronic combined systolic and diastolic CHF (congestive heart failure) ([place])   . Chronic kidney disease   . Coronary artery disease   . Depression   . Diabetes mellitus without complication ([place])   . Diabetes mellitus, type II ([place])   . Hypertension   . MI (myocardial infarction) ([place])    x 5  . Pacemaker   . Prolonged Q-T interval on ECG   . Thyroid disease     Assessment/Plan: 1 Day Post-Op Procedure(s) (LRB): ARTHROPLASTY BIPOLAR HIP (HEMIARTHROPLASTY) (Right) Active Problems:   CHF (congestive heart failure), NYHA class III, chronic, combined (HCC)   Ischemic cardiomyopathy   CKD (chronic kidney disease), stage III (HCC)   Multiple myeloma (HCC)   Hip fracture (HCC)  Estimated body mass index is 29.71 kg/m as calculated from the following:   Height as of this encounter: 5' 4"  (1.626 m).   Weight as of this encounter: 78.5 kg. Advance diet Up with therapy D/C IV fluids when tolerating po intake.  Labs reviewed, Pt is s/p two transfusions, Hg 7.9 this morning. Pt BP 85/54, appears chronic due to history of multiple myeloma. Up with therapy this afternoon. Begin working on BM.  DVT Prophylaxis - Lovenox, Foot Pumps  and TED hose Weight-Bearing as tolerated to right leg  J. Cameron Proud, PA-C [facility] 03/30/2020, 11:08 AM

## 2020-03-30 NOTE — NC FL2 (Signed)
[place] LEVEL OF CARE SCREENING TOOL     IDENTIFICATION  Patient Name: Heather Peterson Birthdate: 10-Jun-1940 Sex: female Admission Date (Current Location): 03/28/2020  [place] and [place] Number:  Engineering geologist and Address:  [facility], [address], [place], [place] [address]      Provider Number: [id number]  Attending Physician Name and Address:  [name], MD  Relative Name and Phone Number:  Jalee Saine [phone]    Current Level of Care: Hospital Recommended Level of Care: [place] Prior Approval Number:    Date Approved/Denied:   PASRR Number:    Discharge Plan: SNF    Current Diagnoses: Patient Active Problem List   Diagnosis Date Noted  . Hip fracture ([place]) 03/28/2020  . Multiple myeloma ([place]) 03/01/2020  . Goals of care, counseling/discussion 03/01/2020  . Acute blood loss anemia   . Gastrointestinal hemorrhage   . UGI bleed 09/04/2019  . Dehydration   . Anemia in chronic kidney disease (CKD) 07/04/2019  . CKD (chronic kidney disease), stage III ([place]) 07/04/2019  . Chronic respiratory failure with hypoxia ([place]) 07/03/2019  . Hyponatremia 07/03/2019  . Prolonged Q-T interval on ECG   . Weakness   . Anemia   . MDD (major depressive disorder), recurrent episode, mild ([place]) 12/14/2018  . Tobacco use disorder 12/14/2018  . At risk for prolonged QT interval syndrome 12/14/2018  . 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 Pedal edema 02/19/2018  . SOB (shortness of breath) on exertion 02/19/2018  . Left main coronary artery disease 03/07/2017  . 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[place]) 03/06/2017  . Ventricular tachycardia ([place]) 03/05/2017  . Defibrillator discharge 03/04/2017  . CHF (congestive heart failure), NYHA class III, chronic, combined ([place]) 03/04/2017  . COPD (chronic obstructive pulmonary disease) ([place]) 02/10/2017  . Diabetes mellitus type 2, uncomplicated  ([place]) 68/34/1962  . H/O ventricular fibrillation 02/10/2017  . Hyperlipidemia 02/10/2017  . 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 Ischemic cardiomyopathy 02/10/2017  . Myocardial infarction ([place]) 02/10/2017  . Moderate mitral regurgitation 02/10/2017  . Syncope and collapse 02/04/2017  . CAD (coronary artery disease) 02/04/2017  . HTN (hypertension) 02/04/2017  . Diabetes ([place]) 02/04/2017    Orientation RESPIRATION BLADDER Height & Weight     Self, Time, Situation, Place  O2 (2L) External catheter Weight: 78.5 kg Height:  5' 4"  (162.6 cm)  BEHAVIORAL SYMPTOMS/MOOD NEUROLOGICAL BOWEL NUTRITION STATUS      Continent Diet (Heart Healthy, Carb Modified)  AMBULATORY STATUS COMMUNICATION OF NEEDS Skin   Extensive Assist Verbally Surgical wounds                       Personal Care Assistance Level of Assistance  Bathing, Feeding, Dressing Bathing Assistance: Maximum assistance Feeding assistance: Limited assistance Dressing Assistance: Maximum assistance     Functional Limitations Info             SPECIAL CARE FACTORS FREQUENCY  PT (By licensed PT), OT (By licensed OT)                    Contractures Contractures Info: Not present    Additional Factors Info  Code Status, Allergies Code Status Info: Full Allergies Info: Celebrex, Glipizide, Levofloxacin, Lisinopril, Sulfa, Metformin, PCN           Current Medications (03/30/2020):  This is the current hospital active medication list Current Facility-Administered Medications  Medication Dose Route Frequency Provider Last Rate Last  Admin  . 0.9 %  sodium chloride infusion (Manually program via Guardrails IV Fluids)   Intravenous Once Sharion Settler, NP      . acetaminophen (TYLENOL) tablet 325-650 mg  325-650 mg Oral Q6H PRN Poggi, Marshall Cork, MD      . acetaminophen (TYLENOL) tablet 500 mg  500 mg Oral Q6H Poggi, Marshall Cork, MD   500 mg at 03/30/20 1257  . allopurinol (ZYLOPRIM)  tablet 50 mg  50 mg Oral Daily Poggi, Marshall Cork, MD   50 mg at 03/30/20 1007  . aspirin EC tablet 81 mg  81 mg Oral QHS Corky Mull, MD   81 mg at 03/29/20 2321  . bisacodyl (DULCOLAX) suppository 10 mg  10 mg Rectal Daily PRN Poggi, Marshall Cork, MD      . ceFAZolin (ANCEF) IVPB 2g/100 mL premix  2 g Intravenous Q6H Poggi, Marshall Cork, MD   Stopped at 03/30/20 1320  . Chlorhexidine Gluconate Cloth 2 % PADS 6 each  6 each Topical Daily [name], MD   6 each at 03/30/20 1010  . dextrose 5 % in lactated ringers infusion   Intravenous Continuous Poggi, Marshall Cork, MD   Stopped at 03/30/20 1413  . diphenhydrAMINE (BENADRYL) 12.5 MG/5ML elixir 12.5-25 mg  12.5-25 mg Oral Q4H PRN Poggi, Marshall Cork, MD      . docusate sodium (COLACE) capsule 100 mg  100 mg Oral BID Poggi, Marshall Cork, MD   100 mg at 03/30/20 1009  . enoxaparin (LOVENOX) injection 40 mg  40 mg Subcutaneous Q24H Poggi, Marshall Cork, MD   40 mg at 03/30/20 1009  . HYDROcodone-acetaminophen (NORCO/VICODIN) 5-325 MG per tablet 1-2 tablet  1-2 tablet Oral Q4H PRN Poggi, Marshall Cork, MD   2 tablet at 03/30/20 1257  . HYDROmorphone (DILAUDID) injection 0.5 mg  0.5 mg Intravenous Q4H PRN Poggi, Marshall Cork, MD   0.5 mg at 03/29/20 1150  . insulin aspart (novoLOG) injection 0-15 Units  0-15 Units Subcutaneous Q4H Poggi, Marshall Cork, MD   3 Units at 03/30/20 1251  . levothyroxine (SYNTHROID) tablet 50 mcg  50 mcg Oral Once per day on Sun Tue Thu Sat [name], MD   50 mcg at 03/30/20 1306   And  . [START ON 03/31/2020] levothyroxine (SYNTHROID) tablet 75 mcg  75 mcg Oral Once per day on Mon Wed Fri [place], Soundra Pilon, MD      . magnesium citrate solution 1 Bottle  1 Bottle Oral Once PRN Poggi, Marshall Cork, MD      . magnesium oxide (MAG-OX) tablet 200 mg  200 mg Oral Daily Poggi, Marshall Cork, MD   200 mg at 03/30/20 1009  . metoCLOPramide (REGLAN) tablet 5-10 mg  5-10 mg Oral Q8H PRN Poggi, Marshall Cork, MD       Or  . metoCLOPramide (REGLAN) injection 5-10 mg  5-10 mg Intravenous Q8H PRN Poggi, Marshall Cork,  MD      . mexiletine (MEXITIL) capsule 200 mg  200 mg Oral Q12H Poggi, Marshall Cork, MD   200 mg at 03/30/20 1007  . midodrine (PROAMATINE) tablet 10 mg  10 mg Oral BID WC Poggi, Marshall Cork, MD   10 mg at 03/30/20 1008  . midodrine (PROAMATINE) tablet 5 mg  5 mg Oral Daily PRN Poggi, Marshall Cork, MD   5 mg at 03/30/20 0308  . multivitamin with minerals tablet 1 tablet  1 tablet Oral Daily Poggi, Marshall Cork, MD   1 tablet at 03/30/20 1007  .  ondansetron (ZOFRAN) tablet 4 mg  4 mg Oral Q6H PRN Poggi, Marshall Cork, MD       Or  . ondansetron (ZOFRAN) injection 4 mg  4 mg Intravenous Q6H PRN Poggi, Marshall Cork, MD      . pantoprazole (PROTONIX) EC tablet 40 mg  40 mg Oral Daily Poggi, Marshall Cork, MD   40 mg at 03/30/20 1007  . polyethylene glycol (MIRALAX / GLYCOLAX) packet 17 g  17 g Oral Daily [name], MD   17 g at 03/30/20 1252  . protein supplement (ENSURE MAX) liquid  11 oz Oral BID BM Poggi, Marshall Cork, MD   11 oz at 03/30/20 1010  . simvastatin (ZOCOR) tablet 20 mg  20 mg Oral QHS Poggi, Marshall Cork, MD   20 mg at 03/29/20 2322  . sodium phosphate (FLEET) 7-19 GM/118ML enema 1 enema  1 enema Rectal Once PRN Poggi, Marshall Cork, MD      . traMADol Veatrice Bourbon) tablet 50 mg  50 mg Oral Q6H PRN Poggi, Marshall Cork, MD   50 mg at 03/30/20 0516  . traZODone (DESYREL) tablet 50 mg  50 mg Oral QHS Poggi, Marshall Cork, MD   50 mg at 03/29/20 2321     Discharge Medications: Please see discharge summary for a list of discharge medications.  Relevant Imaging Results:  Relevant Lab Results:   Additional Information SS# [id number]  Shelbie Ammons, RN

## 2020-03-30 NOTE — Evaluation (Signed)
Physical Therapy Evaluation Patient Details Name: Heather Peterson MRN: [id number] DOB: Apr 19, 1941 Today's Date: 03/30/2020   History of Present Illness  a 79 y.o. 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 Grayland Ormond) presents s/p fall, R hip fracture.  Now s/p R total hip, posterior approach.  Clinical Impression  Pt showed good overall effort and despite some fatigue (eyes closing multiple times mid sentence, etc) she was motivated to do as much as she could.  Ultimately she was quite functionally limited but showed great effort with supine exercises, standing/ambulation was more limited than expected and though she was able to put some weight through the R LE she was clearly very hesitant and could not really use walker to distribute weight and managed only a few very guarded, modest limping steps to turn from bed to recliner.     Follow Up Recommendations SNF    Equipment Recommendations   (TBD)    Recommendations for Other Services       Precautions / Restrictions Precautions Precautions: Fall;Posterior Hip Restrictions Weight Bearing Restrictions: Yes RLE Weight Bearing: Weight bearing as tolerated      Mobility  Bed Mobility Overal bed mobility: Needs Assistance Bed Mobility: Supine to Sit     Supine to sit: Mod assist     General bed mobility comments: Good effort with trying to raise trunk, but ultimately heavy assist with LEs as well as trunk    Transfers Overall transfer level: Needs assistance Equipment used: Rolling walker (2 wheeled) Transfers: Sit to/from Stand Sit to Stand: Min assist         General transfer comment: Pt was able to rise to standing but could not get weight forward/up w/o some direct assist  Ambulation/Gait Ambulation/Gait assistance: Min assist Gait Distance (Feet): 3 Feet Assistive device: Rolling walker (2 wheeled)       General  Gait Details: Pt was able to turn and take a few very small steps, but ultimately struggled with weight shifts, ability to maintain any consistency.  Fatigued with the effort.  Stairs            Wheelchair Mobility    Modified Rankin (Stroke Patients Only)       Balance Overall balance assessment: Needs assistance Sitting-balance support: Bilateral upper extremity supported Sitting balance-Leahy Scale: Fair     Standing balance support: Bilateral upper extremity supported Standing balance-Leahy Scale: Poor Standing balance comment: reliant on the walker, unable to orgainze any type of consistency with ambulation                             Pertinent Vitals/Pain Pain Assessment: 0-10 Pain Score: 5  Pain Location: R hip    Home Living Family/patient expects to be discharged to:: Private residence Living Arrangements: Spouse/significant other;Children Available Help at Discharge: Family;Available 24 hours/day Type of Home: House Home Access: Stairs to enter Entrance Stairs-Rails: None Entrance Stairs-Number of Steps: 3 Home Layout: One level Home Equipment: Walker - 4 wheels;Cane - single point;Shower seat;Grab bars - tub/shower      Prior Function Level of Independence: Independent         Comments: no AD, but admits using furniture, countertops, etc     Hand Dominance        Extremity/Trunk Assessment   Upper Extremity Assessment Upper Extremity Assessment: Generalized weakness    Lower Extremity Assessment Lower Extremity Assessment: Generalized weakness (  expected R post-op weakness limited against gravity strength)       Communication   Communication: No difficulties  Cognition Arousal/Alertness: Awake/alert Behavior During Therapy: WFL for tasks assessed/performed Overall Cognitive Status: Within Functional Limits for tasks assessed                                        General Comments General comments (skin  integrity, edema, etc.): BP is chronically low, 92/49 at start of session.  No c/o lightheadedness t/o transitions, etc    Exercises     Assessment/Plan    PT Assessment Patient needs continued PT services  PT Problem List Decreased strength;Decreased range of motion;Decreased activity tolerance;Decreased balance;Decreased mobility;Decreased coordination;Decreased cognition;Decreased knowledge of use of DME;Decreased safety awareness;Pain;Decreased knowledge of precautions       PT Treatment Interventions DME instruction;Gait training;Stair training;Functional mobility training;Therapeutic activities;Therapeutic exercise;Balance training;Neuromuscular re-education;Patient/family education    PT Goals (Current goals can be found in the Care Plan section)  Acute Rehab PT Goals Patient Stated Goal: get back to walking w/o a walker PT Goal Formulation: With patient Time For Goal Achievement: 04/13/20 Potential to Achieve Goals: Fair    Frequency BID   Barriers to discharge        Co-evaluation               AM-PAC PT "6 Clicks" Mobility  Outcome Measure Help needed turning from your back to your side while in a flat bed without using bedrails?: A Lot Help needed moving from lying on your back to sitting on the side of a flat bed without using bedrails?: A Lot Help needed moving to and from a bed to a chair (including a wheelchair)?: A Lot Help needed standing up from a chair using your arms (e.g., wheelchair or bedside chair)?: A Little Help needed to walk in hospital room?: Total Help needed climbing 3-5 steps with a railing? : Total 6 Click Score: 11    End of Session Equipment Utilized During Treatment: Gait belt Activity Tolerance: Patient limited by fatigue Patient left: with chair alarm set;with call bell/phone within reach Nurse Communication: Mobility status PT Visit Diagnosis: Muscle weakness (generalized) (M62.81);Difficulty in walking, not elsewhere classified  (R26.2);Pain Pain - Right/Left: Right Pain - part of body: Hip    Time: 1130-1210 PT Time Calculation (min) (ACUTE ONLY): 40 min   Charges:   PT Evaluation $PT Eval Low Complexity: 1 Low PT Treatments $Gait Training: 8-22 mins $Therapeutic Exercise: 8-22 mins        Kreg Shropshire, DPT 03/30/2020, 1:45 PM

## 2020-03-30 NOTE — Anesthesia Postprocedure Evaluation (Signed)
Anesthesia Post Note  Patient: Heather Peterson  Procedure(s) Performed: ARTHROPLASTY BIPOLAR HIP (HEMIARTHROPLASTY) (Right Hip)  Patient location during evaluation: Nursing Unit Anesthesia Type: Spinal Level of consciousness: awake and alert Pain management: pain level controlled Vital Signs Assessment: post-procedure vital signs reviewed and stable Respiratory status: spontaneous breathing and respiratory function stable Cardiovascular status: blood pressure returned to baseline and stable Postop Assessment: no headache, no backache, no apparent nausea or vomiting and patient able to bend at knees Anesthetic complications: no   No complications documented.   Last Vitals:  Vitals:   03/30/20 0458 03/30/20 0720  BP: (!) 81/57 (!) 85/54  Pulse: 68 72  Resp: 16 16  Temp: 36.7 C 36.6 C  SpO2:  95%    Last Pain:  Vitals:   03/30/20 0720  TempSrc: Oral  PainSc:                  Caryl Asp

## 2020-03-31 ENCOUNTER — Inpatient Hospital Stay: Payer: Medicare Other

## 2020-03-31 LAB — CBC
HCT: 22.2 % — ABNORMAL LOW (ref 36.0–46.0)
Hemoglobin: 7.5 g/dL — ABNORMAL LOW (ref 12.0–15.0)
MCH: 30.6 pg (ref 26.0–34.0)
MCHC: 33.8 g/dL (ref 30.0–36.0)
MCV: 90.6 fL (ref 80.0–100.0)
Platelets: 125 10*3/uL — ABNORMAL LOW (ref 150–400)
RBC: 2.45 MIL/uL — ABNORMAL LOW (ref 3.87–5.11)
RDW: 17.9 % — ABNORMAL HIGH (ref 11.5–15.5)
WBC: 7 10*3/uL (ref 4.0–10.5)
nRBC: 0 % (ref 0.0–0.2)

## 2020-03-31 LAB — IRON AND TIBC
Iron: 18 ug/dL — ABNORMAL LOW (ref 28–170)
Saturation Ratios: 6 % — ABNORMAL LOW (ref 10.4–31.8)
TIBC: 304 ug/dL (ref 250–450)
UIBC: 286 ug/dL

## 2020-03-31 LAB — BASIC METABOLIC PANEL
Anion gap: 10 (ref 5–15)
BUN: 36 mg/dL — ABNORMAL HIGH (ref 8–23)
CO2: 25 mmol/L (ref 22–32)
Calcium: 7.3 mg/dL — ABNORMAL LOW (ref 8.9–10.3)
Chloride: 95 mmol/L — ABNORMAL LOW (ref 98–111)
Creatinine, Ser: 1.86 mg/dL — ABNORMAL HIGH (ref 0.44–1.00)
GFR, Estimated: 27 mL/min — ABNORMAL LOW (ref >60)
Glucose, Bld: 85 mg/dL (ref 70–99)
Potassium: 3.8 mmol/L (ref 3.5–5.1)
Sodium: 130 mmol/L — ABNORMAL LOW (ref 135–145)

## 2020-03-31 LAB — RETICULOCYTES
Immature Retic Fract: 28.5 % — ABNORMAL HIGH (ref 2.3–15.9)
RBC.: 2.42 MIL/uL — ABNORMAL LOW (ref 3.87–5.11)
Retic Count, Absolute: 47.2 10*3/uL (ref 19.0–186.0)
Retic Ct Pct: 2 % (ref 0.4–3.1)

## 2020-03-31 LAB — GLUCOSE, CAPILLARY
Glucose-Capillary: 113 mg/dL — ABNORMAL HIGH (ref 70–99)
Glucose-Capillary: 130 mg/dL — ABNORMAL HIGH (ref 70–99)
Glucose-Capillary: 76 mg/dL (ref 70–99)
Glucose-Capillary: 87 mg/dL (ref 70–99)
Glucose-Capillary: 90 mg/dL (ref 70–99)
Glucose-Capillary: 92 mg/dL (ref 70–99)

## 2020-03-31 LAB — OSMOLALITY, URINE: Osmolality, Ur: 348 mOsm/kg (ref 300–900)

## 2020-03-31 LAB — OSMOLALITY: Osmolality: 278 mOsm/kg (ref 275–295)

## 2020-03-31 LAB — FERRITIN: Ferritin: 379 ng/mL — ABNORMAL HIGH (ref 11–307)

## 2020-03-31 LAB — VITAMIN B12: Vitamin B-12: 652 pg/mL (ref 180–914)

## 2020-03-31 LAB — TSH: TSH: 0.373 u[IU]/mL (ref 0.350–4.500)

## 2020-03-31 LAB — SURGICAL PATHOLOGY

## 2020-03-31 LAB — SODIUM, URINE, RANDOM: Sodium, Ur: <10 mmol/L

## 2020-03-31 LAB — FOLATE: Folate: 35 ng/mL (ref >5.9)

## 2020-03-31 MED ORDER — LIDOCAINE 5 % EX PTCH
1.0000 | MEDICATED_PATCH | CUTANEOUS | Status: DC
  Administered 2020-04-01 – 2020-04-03 (×3): 1 via TRANSDERMAL
  Filled 2020-03-31 (×5): qty 1

## 2020-03-31 MED ORDER — ENOXAPARIN SODIUM 30 MG/0.3ML ~~LOC~~ SOLN
30.0000 mg | SUBCUTANEOUS | Status: DC
  Administered 2020-04-01 – 2020-04-03 (×3): 30 mg via SUBCUTANEOUS
  Filled 2020-03-31 (×3): qty 0.3

## 2020-03-31 MED ORDER — HYDROCODONE-ACETAMINOPHEN 5-325 MG PO TABS
1.0000 | ORAL_TABLET | ORAL | 0 refills | Status: DC | PRN
Start: 2020-03-31 — End: 2020-05-15

## 2020-03-31 MED ORDER — ENOXAPARIN SODIUM 40 MG/0.4ML ~~LOC~~ SOLN: 40.0000 mg | SUBCUTANEOUS | 0 refills | Status: DC

## 2020-03-31 NOTE — Progress Notes (Signed)
Bladder scanned at 115ms. MD aware, no new orders at this time.

## 2020-03-31 NOTE — Progress Notes (Signed)
PROGRESS NOTE    Heather Peterson  MAU:[id number] DOB: [birth year] DOA: 03/28/2020 PCP: [name], MD   Brief Narrative: Taken from H&P  Heather Peterson is a 79 y.o. female with medical history significant ofCAD, combined CHF, CKD, DM, hypothyroid disease, 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 (sees Dr. Grayland Ormond) presents s/p fall, resulted in comminuted mildly impacted right femoral neck fracture involving the greater trochanter. Orthopedic was consulted-had her hemiarthroplasty on 03/29/2020.  Subjective: Patient was complaining of right hip pain as she was getting washed. No bowel movement since surgery.  Assessment & Plan:   Active Problems:   CHF (congestive heart failure), NYHA class III, chronic, combined (HCC)   Ischemic cardiomyopathy   CKD (chronic kidney disease), stage III (HCC)   Multiple myeloma (HCC)   Hip fracture (HCC)  Right femoral neck fracture secondary to fall. S/p hemiarthroplasty.  There was some concern of hypotension as she was having some dizziness.  No loss of consciousness.  Cardiology was consulted for preop clearance which was done and she went to the OR for hemiarthroplasty yesterday. Cardiology signed off. -Continue with pain management. -PT evaluation -recommending SNF placement. -TOC to work on it  History of HFrEF.  Patient appears euvolemic.  EF of 35 to 40% according to the echo done in 2018.  Cardiology was consulted for preop clearance. -Holding home dose of diuretic due to concern of postural hypotension. -Patient appears euvolemic but did receive 2 units of PRBC. -Monitor intake and output. -Can give Lasix if needed.  Postoperative blood loss anemia. Hemoglobin dropped to 6.2 postoperatively secondary to blood loss. She received 2 units of PRBC. Posttransfusion hemoglobin was 7.9>>7.5 -Continue to monitor. -Transfuse if below 7 -Continue iron supplement  Hypotension.  Unable to check orthostatic vitals. -Continue home dose  of midodrine. -Careful with pain medications.  CKD stage IIIb.  Mild worsening of creatinine at 1.86. -Encourage p.o. hydration -Continue to monitor. -Avoid nephrotoxins  Multiple myeloma.  On chemotherapy. -Being managed by outpatient oncology.  Insulin-dependent type 2 diabetes mellitus.  Patient was on Lantus at home. -Continue with SSI  Objective: Vitals:   03/31/20 0611 03/31/20 0748 03/31/20 1114 03/31/20 1519  BP:  (!) 103/58 99/63 (!) 86/57  Pulse:  71 70 71  Resp:  18 17 17   Temp:  98.2 F (36.8 C) 97.8 F (36.6 C) 97.9 F (36.6 C)  TempSrc:      SpO2:  100% 100% 100%  Weight: 84.9 kg     Height:        Intake/Output Summary (Last 24 hours) at 03/31/2020 1603 Last data filed at 03/31/2020 1121 Gross per 24 hour  Intake 500.52 ml  Output 650 ml  Net -149.48 ml   Filed Weights   03/29/20 0500 03/29/20 1616 03/31/20 0611  Weight: 78.5 kg 78.5 kg 84.9 kg    Examination:  General.  Chronically ill-appearing elderly lady, in no acute distress. Pulmonary.  Lungs clear bilaterally, normal respiratory effort. CV.  Regular rate and rhythm, no JVD, rub or murmur. Abdomen.  Soft, nontender, nondistended, BS positive. CNS.  Alert and oriented x3.  No focal neurologic deficit. Extremities.  No edema, no cyanosis, pulses intact and symmetrical. Psychiatry.  Judgment and insight appears normal.  DVT prophylaxis: SCDs Code Status: Full Family Communication: Discussed with patient Disposition Plan:  Status is: Inpatient  Remains inpatient appropriate because:Inpatient level of care appropriate due to severity of illness   Dispo: The patient is from: Home  Anticipated d/c is to: To be determined              Anticipated d/c date is: 1 day.              Patient currently is not medically stable to d/c.  Waiting for SNF placement.  Consultants:   Cardiology  Orthopedic  Procedures:  Antimicrobials:   Data Reviewed: I have personally reviewed  following labs and imaging studies  CBC: Recent Labs  Lab 03/28/20 1435 03/28/20 1435 03/29/20 0611 03/29/20 2005 03/30/20 0239 03/30/20 0845 03/31/20 0456  WBC 5.7  --  5.8  --  4.7  --  7.0  NEUTROABS 3.9  --   --   --   --   --   --   HGB 8.0*   < > 8.6* 6.2* 6.5* 7.9* 7.5*  HCT 25.7*   < > 27.4* 20.2* 19.8* 23.5* 22.2*  MCV 95.2  --  94.5  --  92.5  --  90.6  PLT 95*  --  103*  --  84*  --  125*   < > = values in this interval not displayed.   Basic Metabolic Panel: Recent Labs  Lab 03/28/20 1435 03/29/20 0611 03/30/20 0239 03/31/20 0456  NA 136 137 134* 130*  K 2.9* 4.1 3.4* 3.8  CL 94* 97* 97* 95*  CO2 32 31 27 25   GLUCOSE 95 97 174* 85  BUN 42* 33* 27* 36*  CREATININE 1.36* 1.37* 1.41* 1.86*  CALCIUM 7.5* 8.0* 7.1* 7.3*  MG 2.6*  --   --   --    GFR: Estimated Creatinine Clearance: 26.3 mL/min (A) (by C-G formula based on SCr of 1.86 mg/dL (H)). Liver Function Tests: Recent Labs  Lab 03/28/20 1435  AST 22  ALT 25  ALKPHOS 67  BILITOT 0.8  PROT 5.6*  ALBUMIN 3.3*   No results for input(s): LIPASE, AMYLASE in the last 168 hours. No results for input(s): AMMONIA in the last 168 hours. Coagulation Profile: Recent Labs  Lab 03/28/20 1609 03/29/20 0902  INR 1.3* 1.3*   Cardiac Enzymes: No results for input(s): CKTOTAL, CKMB, CKMBINDEX, TROPONINI in the last 168 hours. BNP (last 3 results) No results for input(s): PROBNP in the last 8760 hours. HbA1C: Recent Labs    03/29/20 0611  HGBA1C 5.9*   CBG: Recent Labs  Lab 03/30/20 2220 03/31/20 0041 03/31/20 0411 03/31/20 0748 03/31/20 1154  GLUCAP 76 76 92 90 87   Lipid Profile: No results for input(s): CHOL, HDL, LDLCALC, TRIG, CHOLHDL, LDLDIRECT in the last 72 hours. Thyroid Function Tests: Recent Labs    03/31/20 0456  TSH 0.373   Anemia Panel: Recent Labs    03/31/20 0456  VITAMINB12 652  FOLATE 35.0  FERRITIN 379*  TIBC 304  IRON 18*  RETICCTPCT 2.0   Sepsis Labs: No  results for input(s): PROCALCITON, LATICACIDVEN in the last 168 hours.  Recent Results (from the past 240 hour(s))  Respiratory Panel by RT PCR (Flu A&B, Covid) - Nasopharyngeal Swab     Status: None   Collection Time: 03/28/20  3:33 PM   Specimen: Nasopharyngeal Swab  Result Value Ref Range Status   SARS Coronavirus 2 by RT PCR NEGATIVE NEGATIVE Final    Comment: (NOTE) SARS-CoV-2 target nucleic acids are NOT DETECTED.  The SARS-CoV-2 RNA is generally detectable in upper respiratoy specimens during the acute phase of infection. The lowest concentration of SARS-CoV-2 viral copies this assay can detect is 131 copies/mL.  A negative result does not preclude SARS-Cov-2 infection and should not be used as the sole basis for treatment or other patient management decisions. A negative result may occur with  improper specimen collection/handling, submission of specimen other than nasopharyngeal swab, presence of viral mutation(s) within the areas targeted by this assay, and inadequate number of viral copies (<131 copies/mL). A negative result must be combined with clinical observations, patient history, and epidemiological information. The expected result is Negative.  Fact Sheet for Patients:  PinkCheek.be  Fact Sheet for Healthcare Providers:  GravelBags.it  This test is no t yet approved or cleared by the [place] FDA and  has been authorized for detection and/or diagnosis of SARS-CoV-2 by FDA under an Emergency Use Authorization (EUA). This EUA will remain  in effect (meaning this test can be used) for the duration of the COVID-19 declaration under Section 564(b)(1) of the Act, 21 U.S.C. section 360bbb-3(b)(1), unless the authorization is terminated or revoked sooner.     Influenza A by PCR NEGATIVE NEGATIVE Final   Influenza B by PCR NEGATIVE NEGATIVE Final    Comment: (NOTE) The Xpert Xpress SARS-CoV-2/FLU/RSV assay  is intended as an aid in  the diagnosis of influenza from Nasopharyngeal swab specimens and  should not be used as a sole basis for treatment. Nasal washings and  aspirates are unacceptable for Xpert Xpress SARS-CoV-2/FLU/RSV  testing.  Fact Sheet for Patients: PinkCheek.be  Fact Sheet for Healthcare Providers: GravelBags.it  This test is not yet approved or cleared by the [place] FDA and  has been authorized for detection and/or diagnosis of SARS-CoV-2 by  FDA under an Emergency Use Authorization (EUA). This EUA will remain  in effect (meaning this test can be used) for the duration of the  Covid-19 declaration under Section 564(b)(1) of the Act, 21  U.S.C. section 360bbb-3(b)(1), unless the authorization is  terminated or revoked. Performed at [facility], [address] [place], [place] [address]      Radiology Studies: DG Thoracic Spine 2 View  Result Date: 03/31/2020 CLINICAL DATA:  Generalized back pain for few days. RIGHT hip arthroplasty on 03/29/2020 EXAM: THORACIC SPINE 2 VIEWS COMPARISON:  Previous CT imaging is available for comparison. FINDINGS: Pacer defibrillator leads project over the central chest. Osteopenia. No sign of acute fracture or static malalignment of the thoracic spine. Signs of coronary artery disease and prior coronary artery stenting. Vascular calcifications in the thoracic and abdominal aorta. IMPRESSION: 1. No signs of acute fracture or static malalignment. 2. Signs of vascular disease, coronary artery disease and coronary artery stenting. Electronically Signed   By: Zetta Bills M.D.   On: 03/31/2020 08:13   DG Lumbar Spine 2-3 Views  Result Date: 03/31/2020 CLINICAL DATA:  Generalized back pain for few days. RIGHT hip arthroplasty on 03/29/2020. Back pain since surgery. EXAM: LUMBAR SPINE - 2-3 VIEW COMPARISON:  CT of the abdomen and pelvis from March of 2021 in metastatic  bone survey from October of 2021 FINDINGS: Lumbar spine with degenerative changes. Six lumbar type vertebral bodies. Osteopenia. Vertebral body heights are maintained with disc space narrowing at multiple levels greatest at L1-L2 and L2-L3. Mild anterolisthesis of the fourth on the fifth lumbar vertebral body approximately 2 mm to 3 mm is similar to previous imaging with mild retrolisthesis of the fifth on the sixth lumbar type vertebral body. Disc space narrowing also pronounced at the fifth and sixth lumbar level and at the lumbosacral transition. Facet arthropathy is demonstrated. There may be  new anterior translation, anterolisthesis of the sixth lumbar vertebral level on the first sacral type vertebral body approximately 2-3 mm since the previous exam. No sign of acute fracture in the lumbar spine. Incidental note is made of cholecystectomy clips in the RIGHT upper quadrant and renal calculus projecting over the LEFT renal contour approximately 5 mm seen also on the prior CT study from March of 2021 IMPRESSION: Multilevel degenerative changes greatest in lower lumbar spine with potential new mild anterolisthesis of the lowest lumbar vertebral elements on the first sacral vertebral body no signs of fracture. Correlate with radicular symptoms and cross-sectional imaging as warranted. LEFT renal calculus. Electronically Signed   By: Zetta Bills M.D.   On: 03/31/2020 08:23   DG HIP UNILAT W OR W/O PELVIS 2-3 VIEWS RIGHT  Result Date: 03/29/2020 CLINICAL DATA:  Postop from right hip arthroplasty. EXAM: DG HIP (WITH OR WITHOUT PELVIS) 2-3V RIGHT COMPARISON:  None. FINDINGS: A right hip prosthesis is seen in expected position. No evidence of fracture or dislocation. IMPRESSION: Expected postoperative appearance of right hip prosthesis. Electronically Signed   By: Marlaine Hind M.D.   On: 03/29/2020 20:20    Scheduled Meds: . sodium chloride   Intravenous Once  . allopurinol  50 mg Oral Daily  . aspirin EC   81 mg Oral QHS  . Chlorhexidine Gluconate Cloth  6 each Topical Daily  . docusate sodium  100 mg Oral BID  . [START ON 04/01/2020] enoxaparin (LOVENOX) injection  30 mg Subcutaneous Q24H  . ferrous sulfate  325 mg Oral BID WC  . insulin aspart  0-15 Units Subcutaneous Q4H  . levothyroxine  50 mcg Oral Once per day on Sun Tue Thu Sat   And  . levothyroxine  75 mcg Oral Once per day on Mon Wed Fri  . lidocaine  1 patch Transdermal Q24H  . magnesium oxide  200 mg Oral Daily  . mexiletine  200 mg Oral Q12H  . midodrine  10 mg Oral BID WC  . multivitamin with minerals  1 tablet Oral Daily  . pantoprazole  40 mg Oral Daily  . polyethylene glycol  17 g Oral Daily  . Ensure Max Protein  11 oz Oral BID BM  . simvastatin  20 mg Oral QHS  . traZODone  50 mg Oral QHS   Continuous Infusions:    LOS: 3 days   Time spent: 30 minutes.  [name], MD [facility]  If 7PM-7AM, please contact night-coverage [email or web address]  03/31/2020, 4:03 PM   This record has been created using Systems analyst. Errors have been sought and corrected,but may not always be located. Such creation errors do not reflect on the standard of care.

## 2020-03-31 NOTE — Progress Notes (Signed)
Physical Therapy Treatment Patient Details Name: Heather Peterson MRN: [id number] DOB: Nov 14, 1940 Today's Date: 03/31/2020    History of Present Illness 79 y.o. 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 Grayland Ormond) presents s/p fall, R hip fracture.  Now s/p R total hip, posterior approach.    PT Comments    Pt in chair for 1 1/2 hours.  Sliding in chair upon arrival.  +2 mod/max assist to return to bed with RW..She does buckle while turning this session but is eased back onto bed.  She continues to require +2 assist for OOB mobility skills.  She is generally more fatigued this session.  Asks "How long will this last?"  In regards to fatigue.  Encouragement given.   Follow Up Recommendations  SNF     Equipment Recommendations       Recommendations for Other Services       Precautions / Restrictions Precautions Precautions: Fall;Posterior Hip Restrictions Weight Bearing Restrictions: Yes RLE Weight Bearing: Weight bearing as tolerated    Mobility  Bed Mobility Overal bed mobility: Needs Assistance Bed Mobility: Sit to Supine     Supine to sit: Mod assist;Max assist;HOB elevated Sit to supine: Max assist;+2 for physical assistance      Transfers Overall transfer level: Needs assistance Equipment used: Rolling walker (2 wheeled) Transfers: Sit to/from Stand Sit to Stand: Mod assist;+2 physical assistance            Ambulation/Gait Ambulation/Gait assistance: Mod assist;+2 physical assistance Gait Distance (Feet): 3 Feet Assistive device: Rolling walker (2 wheeled) Gait Pattern/deviations: Step-to pattern Gait velocity: decreased   General Gait Details: Pt was able to turn and take a few very small steps, but ultimately struggled with weight shifts, ability to maintain any consistency.  Fatigued with the effort.   Stairs             Wheelchair Mobility     Modified Rankin (Stroke Patients Only)       Balance Overall balance assessment: Needs assistance Sitting-balance support: Bilateral upper extremity supported Sitting balance-Leahy Scale: Poor Sitting balance - Comments: hand on assist for tactile cues to remain upright   Standing balance support: Bilateral upper extremity supported Standing balance-Leahy Scale: Poor Standing balance comment: reliant on the walker, unable to orgainze any type of consistency with ambulation, heavy verbal and tactile cues                            Cognition Arousal/Alertness: Lethargic Behavior During Therapy: WFL for tasks assessed/performed Overall Cognitive Status: Within Functional Limits for tasks assessed                                        Exercises Total Joint Exercises Ankle Circles/Pumps: Strengthening;10 reps Quad Sets: Strengthening;10 reps Heel Slides: AAROM;10 reps (with resisted leg ext) Hip ABduction/ADduction: 10 reps;AROM;AAROM Straight Leg Raises: AAROM;5 reps (unable)    General Comments        Pertinent Vitals/Pain Pain Assessment: Faces Faces Pain Scale: Hurts even more Pain Location: R hip Pain Descriptors / Indicators: Operative site guarding;Sore Pain Intervention(s): Limited activity within patient's tolerance;Monitored during session;Repositioned    Home Living                      Prior Function  PT Goals (current goals can now be found in the care plan section) Progress towards PT goals: Progressing toward goals    Frequency    BID      PT Plan Current plan remains appropriate    Co-evaluation              AM-PAC PT "6 Clicks" Mobility   Outcome Measure  Help needed turning from your back to your side while in a flat bed without using bedrails?: A Lot Help needed moving from lying on your back to sitting on the side of a flat bed without using bedrails?: A Lot Help needed moving to  and from a bed to a chair (including a wheelchair)?: A Lot Help needed standing up from a chair using your arms (e.g., wheelchair or bedside chair)?: A Little Help needed to walk in hospital room?: Total Help needed climbing 3-5 steps with a railing? : Total 6 Click Score: 11    End of Session Equipment Utilized During Treatment: Oxygen Activity Tolerance: Patient limited by fatigue Patient left: with chair alarm set;with call bell/phone within reach Nurse Communication: Mobility status PT Visit Diagnosis: Muscle weakness (generalized) (M62.81);Difficulty in walking, not elsewhere classified (R26.2);Pain Pain - Right/Left: Right Pain - part of body: Hip     Time: 1158-1209 PT Time Calculation (min) (ACUTE ONLY): 11 min  Charges:  $Therapeutic Exercise: 8-22 mins $Therapeutic Activity: 8-22 mins                    Chesley Noon, PTA 03/31/20, 12:32 PM

## 2020-03-31 NOTE — TOC Progression Note (Signed)
Transition of Care [facility]) - Progression Note    Patient Details  Name: Heather Peterson MRN: [id number] Date of Birth: November 08, 1940  Transition of Care [facility]) CM/SW Contact  Shelbie Ammons, RN Phone Number: 03/31/2020, 2:50 PM  Clinical Narrative:   RNCM met with patient at bedside and then spoke with husband and daughter by telephone. Discussed bed offers with both husband and daughter and they ultimately chose bed at [facility]. They are requesting a private room and they plan to reach out to facility about this. RNCM accepted bed in the hub.     Expected Discharge Plan: [place] Barriers to Discharge: Continued Medical Work up  Expected Discharge Plan and Services Expected Discharge Plan: [place] Choice: [place] arrangements for the past 2 months: Single Family Home                                       Social Determinants of Health (SDOH) Interventions    Readmission Risk Interventions Readmission Risk Prevention Plan 09/06/2019  Transportation Screening Complete  Medication Review Press photographer) Complete  PCP or Specialist appointment within 3-5 days of discharge Complete  HRI or [place] Complete  SW Recovery Care/Counseling Consult Complete  [place] Not Applicable  Some recent data might be hidden

## 2020-03-31 NOTE — Progress Notes (Signed)
Physical Therapy Treatment Patient Details Name: Heather Peterson MRN: [id number] DOB: 1940/06/15 Today's Date: 03/31/2020    History of Present Illness 79 y.o. 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 Grayland Ormond) presents s/p fall, R hip fracture.  Now s/p R total hip, posterior approach.    PT Comments    Lumbar x-rays complete and reviewed.  HgB 7.5 but no transfusion til 7.0.  Pt ready for session.  Participated in exercises as described below.  She is assisted to EOB with mod/max a x 1 and HOB highly raised.  Once sitting she requires min a x 1 assist and cues for balance.  Remained sitting x 5 minutes before pt ready to try to get to chair.  Stood with mod a x 2 and with max verbal and tactile cues she is able to transfer to recliner at bedside.  Remained up with needs met.   Follow Up Recommendations  SNF     Equipment Recommendations       Recommendations for Other Services       Precautions / Restrictions Precautions Precautions: Fall;Posterior Hip Restrictions Weight Bearing Restrictions: Yes RLE Weight Bearing: Weight bearing as tolerated    Mobility  Bed Mobility Overal bed mobility: Needs Assistance Bed Mobility: Supine to Sit     Supine to sit: Mod assist;Max assist;HOB elevated        Transfers Overall transfer level: Needs assistance Equipment used: Rolling walker (2 wheeled) Transfers: Sit to/from Stand Sit to Stand: Mod assist            Ambulation/Gait Ambulation/Gait assistance: Min assist;+2 physical assistance Gait Distance (Feet): 3 Feet Assistive device: Rolling walker (2 wheeled) Gait Pattern/deviations: Step-to pattern Gait velocity: decreased   General Gait Details: Pt was able to turn and take a few very small steps, but ultimately struggled with weight shifts, ability to maintain any consistency.  Fatigued with the effort.   Stairs              Wheelchair Mobility    Modified Rankin (Stroke Patients Only)       Balance Overall balance assessment: Needs assistance Sitting-balance support: Bilateral upper extremity supported Sitting balance-Leahy Scale: Poor Sitting balance - Comments: hand on assist for tactile cues to remain upright   Standing balance support: Bilateral upper extremity supported Standing balance-Leahy Scale: Poor Standing balance comment: reliant on the walker, unable to orgainze any type of consistency with ambulation, heavy verbal and tactile cues                            Cognition Arousal/Alertness: Awake/alert Behavior During Therapy: WFL for tasks assessed/performed Overall Cognitive Status: Within Functional Limits for tasks assessed                                        Exercises Total Joint Exercises Ankle Circles/Pumps: Strengthening;10 reps Quad Sets: Strengthening;10 reps Heel Slides: AAROM;10 reps (with resisted leg ext) Hip ABduction/ADduction: 10 reps;AROM;AAROM Straight Leg Raises: AAROM;5 reps (unable)    General Comments        Pertinent Vitals/Pain Pain Assessment: Faces Faces Pain Scale: Hurts even more Pain Location: R hip Pain Descriptors / Indicators: Operative site guarding;Sore Pain Intervention(s): Limited activity within patient's tolerance;Monitored during session;Premedicated before session;Repositioned    Home Living  Prior Function            PT Goals (current goals can now be found in the care plan section) Progress towards PT goals: Progressing toward goals    Frequency    BID      PT Plan Current plan remains appropriate    Co-evaluation              AM-PAC PT "6 Clicks" Mobility   Outcome Measure  Help needed turning from your back to your side while in a flat bed without using bedrails?: A Lot Help needed moving from lying on your back to sitting on the  side of a flat bed without using bedrails?: A Lot Help needed moving to and from a bed to a chair (including a wheelchair)?: A Lot Help needed standing up from a chair using your arms (e.g., wheelchair or bedside chair)?: A Little Help needed to walk in hospital room?: Total Help needed climbing 3-5 steps with a railing? : Total 6 Click Score: 11    End of Session Equipment Utilized During Treatment: Oxygen Activity Tolerance: Patient limited by fatigue Patient left: with chair alarm set;with call bell/phone within reach Nurse Communication: Mobility status PT Visit Diagnosis: Muscle weakness (generalized) (M62.81);Difficulty in walking, not elsewhere classified (R26.2);Pain Pain - Right/Left: Right Pain - part of body: Hip     Time: 8675-4492 PT Time Calculation (min) (ACUTE ONLY): 23 min  Charges:  $Therapeutic Exercise: 8-22 mins $Therapeutic Activity: 8-22 mins                    Chesley Noon, PTA 03/31/20, 10:54 AM

## 2020-03-31 NOTE — Discharge Instructions (Signed)
Instructions after Hip Replacement     J. Dorien Chihuahua, M.D.  Raquel Emmajo Bennette, PA-C     Dept. of [facility]  [place],   [address]  Phone: [phone]   Fax: [phone]    DIET: . Drink plenty of non-alcoholic fluids. . Resume your normal diet. Include foods high in fiber.  ACTIVITY:  . You may use crutches or a walker with weight-bearing as tolerated, unless instructed otherwise. . You may be weaned off of the walker or crutches by your Physical Therapist.  . Do NOT reach below the level of your knees or cross your legs until allowed.    . Continue doing gentle exercises. Exercising will reduce the pain and swelling, increase motion, and prevent muscle weakness.   . Please continue to use the TED compression stockings for 6 weeks. You may remove the stockings at night, but should reapply them in the morning. . Do not drive or operate any equipment until instructed.  WOUND CARE:  . Continue to use ice packs periodically to reduce pain and swelling. Marland Kitchen Keep the incision clean and dry. . You may bathe or shower after the staples are removed at the first office visit following surgery.  MEDICATIONS: . Dennis Bast may resume your regular medications. . Please take the pain medication as prescribed on the medication. . Do not take pain medication on an empty stomach. . You have been given a prescription for a blood thinner to prevent blood clots. Please take the medication as instructed. (NOTE: After completing a 2 week course of Lovenox, take one Enteric-coated aspirin once a day.) . Pain medications and iron supplements can cause constipation. Use a stool softener (Senokot or Colace) on a daily basis and a laxative (dulcolax or miralax) as needed. . Do not drive or drink alcoholic beverages when taking pain medications.  CALL THE OFFICE FOR: . Temperature above 101 degrees . Excessive bleeding or drainage on the  dressing. . Excessive swelling, coldness, or paleness of the toes. . Persistent nausea and vomiting.  FOLLOW-UP:  . Contact office to schedule follow-up in 6 weeks. . Arrangements have been made for continuation of Physical Therapy (either home therapy or outpatient therapy).

## 2020-03-31 NOTE — Progress Notes (Signed)
Patient pain level 10 after tylenol given. Vicodin given. Patient requests doctor visit as soon as possible. Will continue to monitor.

## 2020-03-31 NOTE — Progress Notes (Signed)
PHARMACIST - PHYSICIAN COMMUNICATION  CONCERNING:  Enoxaparin (Lovenox) for DVT Prophylaxis    RECOMMENDATION: Patient was prescribed enoxaprin 4m q24 hours for VTE prophylaxis.   Filed Weights   03/29/20 0500 03/29/20 1616 03/31/20 0611  Weight: 78.5 kg (173 lb 1 oz) 78.5 kg (173 lb 1 oz) 84.9 kg (187 lb 3.2 oz)    Body mass index is 32.13 kg/m.  Estimated Creatinine Clearance: 26.3 mL/min (A) (by C-G formula based on SCr of 1.86 mg/dL (H)).  .  Patient is candidate for enoxaparin 312mevery 24 hours based on CrCl <3040min or Weight <45kg  DESCRIPTION: [facility]  Patient is now receiving enoxaparin 30 mg every 24 hours - D/w Dr. PogRoland Rack the setting of hip frx.    AsaRowland LatheharmD Clinical Pharmacist  03/31/2020 11:11 AM

## 2020-03-31 NOTE — Care Management Important Message (Signed)
Important Message  Patient Details  Name: Heather Peterson MRN: [id number] Date of Birth: November 21, 1940   Medicare Important Message Given:  Yes     Dannette Barbara 03/31/2020, 10:52 AM

## 2020-04-01 LAB — BPAM RBC
Blood Product Expiration Date: 202112022359
Blood Product Expiration Date: 202112042359
Blood Product Expiration Date: 202112072359
Blood Product Expiration Date: 202112162359
Blood Product Expiration Date: 202112162359
ISSUE DATE / TIME: 202111172143
ISSUE DATE / TIME: 202111180435
Unit Type and Rh: 7300
Unit Type and Rh: 7300
Unit Type and Rh: 9500
Unit Type and Rh: 9500
Unit Type and Rh: 9500

## 2020-04-01 LAB — CBC
HCT: 23.1 % — ABNORMAL LOW (ref 36.0–46.0)
Hemoglobin: 7.6 g/dL — ABNORMAL LOW (ref 12.0–15.0)
MCH: 30.3 pg (ref 26.0–34.0)
MCHC: 32.9 g/dL (ref 30.0–36.0)
MCV: 92 fL (ref 80.0–100.0)
Platelets: 166 10*3/uL (ref 150–400)
RBC: 2.51 MIL/uL — ABNORMAL LOW (ref 3.87–5.11)
RDW: 17.3 % — ABNORMAL HIGH (ref 11.5–15.5)
WBC: 7 10*3/uL (ref 4.0–10.5)
nRBC: 0 % (ref 0.0–0.2)

## 2020-04-01 LAB — GLUCOSE, CAPILLARY
Glucose-Capillary: 111 mg/dL — ABNORMAL HIGH (ref 70–99)
Glucose-Capillary: 112 mg/dL — ABNORMAL HIGH (ref 70–99)
Glucose-Capillary: 121 mg/dL — ABNORMAL HIGH (ref 70–99)
Glucose-Capillary: 123 mg/dL — ABNORMAL HIGH (ref 70–99)
Glucose-Capillary: 132 mg/dL — ABNORMAL HIGH (ref 70–99)
Glucose-Capillary: 169 mg/dL — ABNORMAL HIGH (ref 70–99)

## 2020-04-01 LAB — TYPE AND SCREEN
ABO/RH(D): B POS
Antibody Screen: POSITIVE
Unit division: 0
Unit division: 0
Unit division: 0
Unit division: 0
Unit division: 0

## 2020-04-01 LAB — BASIC METABOLIC PANEL
Anion gap: 10 (ref 5–15)
BUN: 42 mg/dL — ABNORMAL HIGH (ref 8–23)
CO2: 26 mmol/L (ref 22–32)
Calcium: 8 mg/dL — ABNORMAL LOW (ref 8.9–10.3)
Chloride: 96 mmol/L — ABNORMAL LOW (ref 98–111)
Creatinine, Ser: 1.96 mg/dL — ABNORMAL HIGH (ref 0.44–1.00)
GFR, Estimated: 26 mL/min — ABNORMAL LOW (ref >60)
Glucose, Bld: 112 mg/dL — ABNORMAL HIGH (ref 70–99)
Potassium: 4.4 mmol/L (ref 3.5–5.1)
Sodium: 132 mmol/L — ABNORMAL LOW (ref 135–145)

## 2020-04-01 MED ORDER — LACTATED RINGERS IV SOLN: INTRAVENOUS | Status: DC

## 2020-04-01 MED ORDER — INSULIN ASPART 100 UNIT/ML ~~LOC~~ SOLN
0.0000 [IU] | Freq: Three times a day (TID) | SUBCUTANEOUS | Status: DC
  Administered 2020-04-01 – 2020-04-02 (×2): 2 [IU] via SUBCUTANEOUS
  Administered 2020-04-02: 3 [IU] via SUBCUTANEOUS
  Filled 2020-04-01 (×3): qty 1

## 2020-04-01 MED ORDER — INSULIN ASPART 100 UNIT/ML ~~LOC~~ SOLN
0.0000 [IU] | Freq: Every day | SUBCUTANEOUS | Status: DC
  Filled 2020-04-01: qty 1

## 2020-04-01 NOTE — Progress Notes (Signed)
PROGRESS NOTE    Heather Peterson  LEX:[id number] DOB: 09-24-1940 DOA: 03/28/2020 PCP: [name], MD   Brief Narrative: Taken from H&P  Heather Peterson is a 79 y.o. female with medical history significant ofCAD, combined CHF, CKD, DM, hypothyroid disease, 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 (sees Dr. Grayland Ormond) presents s/p fall, resulted in comminuted mildly impacted right femoral neck fracture involving the greater trochanter. Orthopedic was consulted-had her hemiarthroplasty on 03/29/2020.  Subjective:   Assessment & Plan:   Active Problems:   CHF (congestive heart failure), NYHA class III, chronic, combined (HCC)   Ischemic cardiomyopathy   CKD (chronic kidney disease), stage III (HCC)   Multiple myeloma (HCC)   Hip fracture (HCC)  Right femoral neck fracture secondary to fall. S/p hemiarthroplasty.  There was some concern of hypotension as she was having some dizziness.  No loss of consciousness.  Cardiology was consulted for preop clearance which was done and she went to the OR for hemiarthroplasty yesterday. Cardiology signed off. -Continue with pain management, I discontinued Dilaudid today and we will try Tylenol before giving any opioids as patient is becoming little more somnolent. -PT evaluation -recommending SNF placement. -TOC to work on it  History of HFrEF.  Patient appears euvolemic.  EF of 35 to 40% according to the echo done in 2018.  Cardiology was consulted for preop clearance. -Holding home dose of diuretic due to concern of postural hypotension. -Patient appears euvolemic but did receive 2 units of PRBC. -Monitor intake and output. -Can give Lasix if needed.  Postoperative blood loss anemia. Hemoglobin dropped to 6.2 postoperatively secondary to blood loss. She received 2 units of PRBC. Posttransfusion hemoglobin was 7.9>>7.5 -Continue to monitor. -Transfuse if below 7 -Continue iron supplement  Hypotension.  Unable to check orthostatic  vitals. -Continue home dose of midodrine. -Careful with pain medications.  AKI with CKD stage IIIb.  Mild worsening of creatinine at 1.96.  Patient appears dry and not taking much p.o. fluids. -Gentle IV hydration with LR 50 cc an hour. -Need to monitor respiratory status. -Continue to monitor. -Avoid nephrotoxins  Multiple myeloma.  On chemotherapy. -Being managed by outpatient oncology.  Insulin-dependent type 2 diabetes mellitus.  Patient was on Lantus at home. -Continue with SSI  Objective: Vitals:   04/01/20 0801 04/01/20 1149 04/01/20 1234 04/01/20 1601  BP: (!) 96/59 (!) 100/57  (!) 99/56  Pulse: 70 69  70  Resp: _0 Temp: (!) 97.4 F (36.3 C) 97.6 F (36.4 C)  97.9 F (36.6 C)  TempSrc:  Oral    SpO2: 100% 100% 98% 100%  Weight:      Height:        Intake/Output Summary (Last 24 hours) at 04/01/2020 1610 Last data filed at 04/01/2020 0900 Gross per 24 hour  Intake -  Output 650 ml  Net -650 ml   Filed Weights   03/29/20 1616 03/31/20 0611 04/01/20 0611  Weight: 78.5 kg 84.9 kg 80.6 kg    Examination:  General.  Chronically ill-appearing elderly lady, in no acute distress. Pulmonary.  Lungs clear bilaterally, normal respiratory effort. CV.  Regular rate and rhythm, no JVD, rub or murmur. Abdomen.  Soft, nontender, nondistended, BS positive. CNS.  Alert and oriented x3.  No focal neurologic deficit. Extremities.  No edema, no cyanosis, pulses intact and symmetrical. Psychiatry.  Judgment and insight appears normal.  DVT prophylaxis: SCDs Code Status: Full Family Communication: Talk with daughter on phone. Disposition Plan:  Status is: Inpatient  Remains inpatient appropriate because:Inpatient level of care appropriate due to severity of illness   Dispo: The patient is from: Home              Anticipated d/c is to: To be determined              Anticipated d/c date is: 1 day.              Patient currently is not medically stable to d/c.   Waiting for SNF placement.  Consultants:   Cardiology  Orthopedic  Procedures:  Antimicrobials:   Data Reviewed: I have personally reviewed following labs and imaging studies  CBC: Recent Labs  Lab 03/28/20 1435 03/28/20 1435 03/29/20 0611 03/29/20 0611 03/29/20 2005 03/30/20 0239 03/30/20 0845 03/31/20 0456 04/01/20 0817  WBC 5.7  --  5.8  --   --  4.7  --  7.0 7.0  NEUTROABS 3.9  --   --   --   --   --   --   --   --   HGB 8.0*   < > 8.6*   < > 6.2* 6.5* 7.9* 7.5* 7.6*  HCT 25.7*   < > 27.4*   < > 20.2* 19.8* 23.5* 22.2* 23.1*  MCV 95.2  --  94.5  --   --  92.5  --  90.6 92.0  PLT 95*  --  103*  --   --  84*  --  125* 166   < > = values in this interval not displayed.   Basic Metabolic Panel: Recent Labs  Lab 03/28/20 1435 03/29/20 0611 03/30/20 0239 03/31/20 0456 04/01/20 0653  NA 136 137 134* 130* 132*  K 2.9* 4.1 3.4* 3.8 4.4  CL 94* 97* 97* 95* 96*  CO2 32 _0 GLUCOSE 95 97 174* 85 112*  BUN 42* 33* 27* 36* 42*  CREATININE 1.36* 1.37* 1.41* 1.86* 1.96*  CALCIUM 7.5* 8.0* 7.1* 7.3* 8.0*  MG 2.6*  --   --   --   --    GFR: Estimated Creatinine Clearance: 24.3 mL/min (A) (by C-G formula based on SCr of 1.96 mg/dL (H)). Liver Function Tests: Recent Labs  Lab 03/28/20 1435  AST 22  ALT 25  ALKPHOS 67  BILITOT 0.8  PROT 5.6*  ALBUMIN 3.3*   No results for input(s): LIPASE, AMYLASE in the last 168 hours. No results for input(s): AMMONIA in the last 168 hours. Coagulation Profile: Recent Labs  Lab 03/28/20 1609 03/29/20 0902  INR 1.3* 1.3*   Cardiac Enzymes: No results for input(s): CKTOTAL, CKMB, CKMBINDEX, TROPONINI in the last 168 hours. BNP (last 3 results) No results for input(s): PROBNP in the last 8760 hours. HbA1C: No results for input(s): HGBA1C in the last 72 hours. CBG: Recent Labs  Lab 03/31/20 2134 04/01/20 0025 04/01/20 0413 04/01/20 0758 04/01/20 1153  GLUCAP 113* 123* 121* 111* 112*   Lipid Profile: No  results for input(s): CHOL, HDL, LDLCALC, TRIG, CHOLHDL, LDLDIRECT in the last 72 hours. Thyroid Function Tests: Recent Labs    03/31/20 0456  TSH 0.373   Anemia Panel: Recent Labs    03/31/20 0456  VITAMINB12 652  FOLATE 35.0  FERRITIN 379*  TIBC 304  IRON 18*  RETICCTPCT 2.0   Sepsis Labs: No results for input(s): PROCALCITON, LATICACIDVEN in the last 168 hours.  Recent Results (from the past 240 hour(s))  Respiratory Panel by RT PCR (Flu A&B, Covid) - Nasopharyngeal Swab  Status: None   Collection Time: 03/28/20  3:33 PM   Specimen: Nasopharyngeal Swab  Result Value Ref Range Status   SARS Coronavirus 2 by RT PCR NEGATIVE NEGATIVE Final    Comment: (NOTE) SARS-CoV-2 target nucleic acids are NOT DETECTED.  The SARS-CoV-2 RNA is generally detectable in upper respiratoy specimens during the acute phase of infection. The lowest concentration of SARS-CoV-2 viral copies this assay can detect is 131 copies/mL. A negative result does not preclude SARS-Cov-2 infection and should not be used as the sole basis for treatment or other patient management decisions. A negative result may occur with  improper specimen collection/handling, submission of specimen other than nasopharyngeal swab, presence of viral mutation(s) within the areas targeted by this assay, and inadequate number of viral copies (<131 copies/mL). A negative result must be combined with clinical observations, patient history, and epidemiological information. The expected result is Negative.  Fact Sheet for Patients:  PinkCheek.be  Fact Sheet for Healthcare Providers:  GravelBags.it  This test is no t yet approved or cleared by the [place] FDA and  has been authorized for detection and/or diagnosis of SARS-CoV-2 by FDA under an Emergency Use Authorization (EUA). This EUA will remain  in effect (meaning this test can be used) for the duration of  the COVID-19 declaration under Section 564(b)(1) of the Act, 21 U.S.C. section 360bbb-3(b)(1), unless the authorization is terminated or revoked sooner.     Influenza A by PCR NEGATIVE NEGATIVE Final   Influenza B by PCR NEGATIVE NEGATIVE Final    Comment: (NOTE) The Xpert Xpress SARS-CoV-2/FLU/RSV assay is intended as an aid in  the diagnosis of influenza from Nasopharyngeal swab specimens and  should not be used as a sole basis for treatment. Nasal washings and  aspirates are unacceptable for Xpert Xpress SARS-CoV-2/FLU/RSV  testing.  Fact Sheet for Patients: PinkCheek.be  Fact Sheet for Healthcare Providers: GravelBags.it  This test is not yet approved or cleared by the [place] FDA and  has been authorized for detection and/or diagnosis of SARS-CoV-2 by  FDA under an Emergency Use Authorization (EUA). This EUA will remain  in effect (meaning this test can be used) for the duration of the  Covid-19 declaration under Section 564(b)(1) of the Act, 21  U.S.C. section 360bbb-3(b)(1), unless the authorization is  terminated or revoked. Performed at [facility], [address] [place], [place] [address]      Radiology Studies: DG Thoracic Spine 2 View  Result Date: 03/31/2020 CLINICAL DATA:  Generalized back pain for few days. RIGHT hip arthroplasty on 03/29/2020 EXAM: THORACIC SPINE 2 VIEWS COMPARISON:  Previous CT imaging is available for comparison. FINDINGS: Pacer defibrillator leads project over the central chest. Osteopenia. No sign of acute fracture or static malalignment of the thoracic spine. Signs of coronary artery disease and prior coronary artery stenting. Vascular calcifications in the thoracic and abdominal aorta. IMPRESSION: 1. No signs of acute fracture or static malalignment. 2. Signs of vascular disease, coronary artery disease and coronary artery stenting. Electronically Signed   By:  Zetta Bills M.D.   On: 03/31/2020 08:13   DG Lumbar Spine 2-3 Views  Result Date: 03/31/2020 CLINICAL DATA:  Generalized back pain for few days. RIGHT hip arthroplasty on 03/29/2020. Back pain since surgery. EXAM: LUMBAR SPINE - 2-3 VIEW COMPARISON:  CT of the abdomen and pelvis from March of 2021 in metastatic bone survey from October of 2021 FINDINGS: Lumbar spine with degenerative changes. Six lumbar type vertebral bodies. Osteopenia. Vertebral  body heights are maintained with disc space narrowing at multiple levels greatest at L1-L2 and L2-L3. Mild anterolisthesis of the fourth on the fifth lumbar vertebral body approximately 2 mm to 3 mm is similar to previous imaging with mild retrolisthesis of the fifth on the sixth lumbar type vertebral body. Disc space narrowing also pronounced at the fifth and sixth lumbar level and at the lumbosacral transition. Facet arthropathy is demonstrated. There may be new anterior translation, anterolisthesis of the sixth lumbar vertebral level on the first sacral type vertebral body approximately 2-3 mm since the previous exam. No sign of acute fracture in the lumbar spine. Incidental note is made of cholecystectomy clips in the RIGHT upper quadrant and renal calculus projecting over the LEFT renal contour approximately 5 mm seen also on the prior CT study from March of 2021 IMPRESSION: Multilevel degenerative changes greatest in lower lumbar spine with potential new mild anterolisthesis of the lowest lumbar vertebral elements on the first sacral vertebral body no signs of fracture. Correlate with radicular symptoms and cross-sectional imaging as warranted. LEFT renal calculus. Electronically Signed   By: Zetta Bills M.D.   On: 03/31/2020 08:23    Scheduled Meds: . sodium chloride   Intravenous Once  . allopurinol  50 mg Oral Daily  . aspirin EC  81 mg Oral QHS  . Chlorhexidine Gluconate Cloth  6 each Topical Daily  . docusate sodium  100 mg Oral BID  .  enoxaparin (LOVENOX) injection  30 mg Subcutaneous Q24H  . ferrous sulfate  325 mg Oral BID WC  . insulin aspart  0-15 Units Subcutaneous TID WC  . insulin aspart  0-5 Units Subcutaneous QHS  . levothyroxine  50 mcg Oral Once per day on Sun Tue Thu Sat   And  . levothyroxine  75 mcg Oral Once per day on Mon Wed Fri  . lidocaine  1 patch Transdermal Q24H  . magnesium oxide  200 mg Oral Daily  . mexiletine  200 mg Oral Q12H  . midodrine  10 mg Oral BID WC  . multivitamin with minerals  1 tablet Oral Daily  . pantoprazole  40 mg Oral Daily  . polyethylene glycol  17 g Oral Daily  . Ensure Max Protein  11 oz Oral BID BM  . simvastatin  20 mg Oral QHS  . traZODone  50 mg Oral QHS   Continuous Infusions:    LOS: 4 days   Time spent: 30 minutes.  [name], MD [facility]  If 7PM-7AM, please contact night-coverage [email or web address]  04/01/2020, 4:10 PM   This record has been created using Systems analyst. Errors have been sought and corrected,but may not always be located. Such creation errors do not reflect on the standard of care.

## 2020-04-01 NOTE — Progress Notes (Signed)
Subjective: 3 Days Post-Op Procedure(s) (LRB): ARTHROPLASTY BIPOLAR HIP (HEMIARTHROPLASTY) (Right) Patient reports pain as mild.   No complaints this morning. PT and care management to assist with d/c planning, will likely need SNF.  Patient requesting to go home. Patient was able to have good bowel movement last night. Negative for chest pain and shortness of breath Fever: no Gastrointestinal:Negative for nausea and vomiting  Objective: Vital signs in last 24 hours: Temp:  [97.4 F (36.3 C)-98.6 F (37 C)] 97.4 F (36.3 C) (11/20 0801) Pulse Rate:  [70-73] 70 (11/20 0801) Resp:  [16-18] 18 (11/20 0801) BP: (86-99)/(53-83) 96/59 (11/20 0801) SpO2:  [98 %-100 %] 100 % (11/20 0801) Weight:  [80.6 kg] 80.6 kg (11/20 0611)  Intake/Output from previous day:  Intake/Output Summary (Last 24 hours) at 04/01/2020 0804 Last data filed at 04/01/2020 0607 Gross per 24 hour  Intake --  Output 300 ml  Net -300 ml    Intake/Output this shift: No intake/output data recorded.  Labs: Recent Labs    03/29/20 2005 03/30/20 0239 03/30/20 0845 03/31/20 0456  HGB 6.2* 6.5* 7.9* 7.5*   Recent Labs    03/30/20 0239 03/30/20 0239 03/30/20 0845 03/31/20 0456  WBC 4.7  --   --  7.0  RBC 2.14*  --   --  2.45*  2.42*  HCT 19.8*   < > 23.5* 22.2*  PLT 84*  --   --  125*   < > = values in this interval not displayed.   Recent Labs    03/31/20 0456 04/01/20 0653  NA 130* 132*  K 3.8 4.4  CL 95* 96*  CO2 25 26  BUN 36* 42*  CREATININE 1.86* 1.96*  GLUCOSE 85 112*  CALCIUM 7.3* 8.0*   Recent Labs    03/29/20 0902  INR 1.3*     EXAM General - Patient is Alert, Appropriate and Oriented Extremity - ABD soft Sensation intact distally Intact pulses distally Dorsiflexion/Plantar flexion intact Incision: dressing C/D/I No cellulitis present Dressing/Incision - clean, dry, no drainage Motor Function - intact, moving foot and toes well on exam.   Past Medical History:   Diagnosis Date  . Anxiety   . Chronic combined systolic and diastolic CHF (congestive heart failure) ([place])   . Chronic kidney disease   . Coronary artery disease   . Depression   . Diabetes mellitus without complication ([place])   . Diabetes mellitus, type II ([place])   . Hypertension   . MI (myocardial infarction) ([place])    x 5  . Pacemaker   . Prolonged Q-T interval on ECG   . Thyroid disease     Assessment/Plan: 3 Days Post-Op Procedure(s) (LRB): ARTHROPLASTY BIPOLAR HIP (HEMIARTHROPLASTY) (Right) Active Problems:   CHF (congestive heart failure), NYHA class III, chronic, combined (HCC)   Ischemic cardiomyopathy   CKD (chronic kidney disease), stage III (HCC)   Multiple myeloma (HCC)   Hip fracture (HCC)  Estimated body mass index is 30.5 kg/m as calculated from the following:   Height as of this encounter: 5' 4" (1.626 m).   Weight as of this encounter: 80.6 kg. Advance diet Up with therapy D/C IV fluids when tolerating po intake. No CBC this morning.  CBC order placed.  Patient status post 2 transfusions hemoglobin 7.5 yesterday. Pain well controlled Up with therapy this afternoon. Care management to assist with discharge to skilled nursing facility.  DVT Prophylaxis - Lovenox, Foot Pumps and TED hose Weight-Bearing as tolerated to right leg  T.  Rachelle Hora, PA-C [facility] 04/01/2020, 8:04 AM

## 2020-04-01 NOTE — Progress Notes (Signed)
Physical Therapy Treatment Patient Details Name: Heather Peterson MRN: [id number] DOB: [birth year] Today's Date: 04/01/2020    History of Present Illness 79 y.o. 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 Grayland Peterson) presents s/p fall, R hip fracture.  Now s/p R total hip, posterior approach.    PT Comments    79 yo Female reports increased soreness and stiffness in RLE. PT instructed patient in LE ROM/strengthening exercise. She was able to initiate RLE movement but had difficulty with increased ROM due to pain and weakness. Patient was max A +2 for bed mobility. Attempted sit to stand with RW but patient unable to achieve standing position due to decreased forward weight shift and decreased LE weight bearing; PT elevated bed to reduce hip flexion however despite elevated bed, patient still unable to achieve standing. Orthopedic Surgeon came in during PT session, educated him in patients mobility level. Patient continues to have weakness in BLE. She will benefit from SNF upon discharge due to limited mobility;    Follow Up Recommendations  SNF     Equipment Recommendations       Recommendations for Other Services       Precautions / Restrictions Precautions Precautions: Fall;Posterior Hip Restrictions Weight Bearing Restrictions: Yes RLE Weight Bearing: Weight bearing as tolerated    Mobility  Bed Mobility Overal bed mobility: Needs Assistance Bed Mobility: Supine to Sit     Supine to sit: Mod assist;+2 for physical assistance Sit to supine: Max assist;+2 for physical assistance   General bed mobility comments: patient able to initiate some movement but is very lethargic and slow with movement;  Transfers Overall transfer level: Needs assistance Equipment used: Rolling walker (2 wheeled) Transfers: Sit to/from Stand Sit to Stand: Max assist;+2 physical assistance          General transfer comment: Patient unable to fully stand this session. Attempted sit to stand from elevated bed but patient still unable. She has difficulty with forward weight shift despite elevated bed for less hip flexion. She also exhibits decreased weight bearing in LE;  Ambulation/Gait             General Gait Details: pt unable to walk at this time;   Stairs             Wheelchair Mobility    Modified Rankin (Stroke Patients Only)       Balance Overall balance assessment: Needs assistance Sitting-balance support: Bilateral upper extremity supported Sitting balance-Leahy Scale: Poor Sitting balance - Comments: requires HHA for sitting balance with decreased trunk control;                                    Cognition Arousal/Alertness: Lethargic Behavior During Therapy: WFL for tasks assessed/performed Overall Cognitive Status: Within Functional Limits for tasks assessed                                        Exercises General Exercises - Lower Extremity Ankle Circles/Pumps: AROM;Both;10 reps;Supine Short Arc Quad: AROM;Both;10 reps;Supine Heel Slides: AROM;Left;10 reps;Supine Hip ABduction/ADduction: AROM;AAROM;Right;Left;10 reps;Supine Other Exercises Other Exercises: Patient required AAROM on RLE for increased ROM. She does have difficulty with ROM on RLE due to weakness and pain;    General Comments  Pertinent Vitals/Pain Pain Assessment: 0-10 Pain Score: 4  Pain Location: right hip Pain Descriptors / Indicators: Operative site guarding;Sore Pain Intervention(s): Limited activity within patient's tolerance;Monitored during session;Premedicated before session    Home Living                      Prior Function            PT Goals (current goals can now be found in the care plan section) Acute Rehab PT Goals Patient Stated Goal: get back to walking w/o a walker PT Goal Formulation: With  patient Time For Goal Achievement: 04/13/20 Potential to Achieve Goals: Fair Progress towards PT goals: Progressing toward goals    Frequency    BID      PT Plan Current plan remains appropriate    Co-evaluation              AM-PAC PT "6 Clicks" Mobility   Outcome Measure  Help needed turning from your back to your side while in a flat bed without using bedrails?: A Lot Help needed moving from lying on your back to sitting on the side of a flat bed without using bedrails?: A Lot Help needed moving to and from a bed to a chair (including a wheelchair)?: Total Help needed standing up from a chair using your arms (e.g., wheelchair or bedside chair)?: A Lot Help needed to walk in hospital room?: Total Help needed climbing 3-5 steps with a railing? : Total 6 Click Score: 9    End of Session Equipment Utilized During Treatment: Oxygen Activity Tolerance: Patient limited by fatigue Patient left: in bed;with nursing/sitter in room;with bed alarm set Nurse Communication: Mobility status PT Visit Diagnosis: Muscle weakness (generalized) (M62.81);Difficulty in walking, not elsewhere classified (R26.2);Pain Pain - Right/Left: Right Pain - part of body: Hip     Time: 0802-2336 PT Time Calculation (min) (ACUTE ONLY): 45 min  Charges:  $Therapeutic Exercise: 23-37 mins $Therapeutic Activity: 8-22 mins                      Heather Peterson PT, DPT 04/01/2020, 12:48 PM

## 2020-04-01 NOTE — Progress Notes (Signed)
PT Cancellation Note  Patient Details Name: Heather Peterson MRN: [id number] DOB: 04/14/1941   Cancelled Treatment:    Reason Eval/Treat Not Completed: Other (comment)  Attempted PT treatment. Patient in her bed eating breakfast. She requested that I come back later as she wanted to eat and take a nap. Will re-attempt when patient is ready to participate. Thank you for this referral.    Rafaella Kole PT, DPT 04/01/2020, 10:19 AM

## 2020-04-01 NOTE — Progress Notes (Signed)
Physical Therapy Treatment Patient Details Name: Heather Peterson MRN: [id number] DOB: [birth year] Today's Date: 04/01/2020    History of Present Illness 79 y.o. 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 Grayland Ormond) presents s/p fall, R hip fracture.  Now s/p R total hip, posterior approach.    PT Comments    Pt was supine in bed with O2 [place] donned and resting comfortably. She agrees to PT session with max encouragement. She was unable to recall hip precautions. Therapist re-educated pt on precautions and reviewed HEP exercises. She then was able to progress to EOB and stand 1 x with max assist + 2nd person for safety. Stood with BUE support on RW x 2 minutes prior to requesting to sit. Unwilling to stand a 2nd trial. Pt daughter arrived and was able to confirm baseline cognition and agrees to SNF recommendation. She will benefit from SNF at DC to address deficits and assist pt towards PLOF. Pt was in bed at conclusion of session with call bell in reach and RN in room.     Follow Up Recommendations  SNF     Equipment Recommendations  Other (comment) (defer to next level of care)    Recommendations for Other Services       Precautions / Restrictions Precautions Precautions: Fall;Posterior Hip Precaution Booklet Issued: Yes (comment) Restrictions Weight Bearing Restrictions: Yes RLE Weight Bearing: Weight bearing as tolerated    Mobility  Bed Mobility Overal bed mobility: Needs Assistance Bed Mobility: Supine to Sit     Supine to sit: Mod assist;Max assist (of one) Sit to supine: Max assist (of one)      Transfers Overall transfer level: Needs assistance Equipment used: Rolling walker (2 wheeled) Transfers: Sit to/from Stand Sit to Stand: From elevated surface;+2 safety/equipment;Mod assist;Max assist         General transfer comment: Pt was able to stand 1 x 2 minutes at EOB  with +1 max assist and a 2nd person close for safety. Pt was unwilling to stand a 2nd time. daughter arrived when pt was being assisted back to supine.   Ambulation/Gait    General Gait Details: unable/unsafe/unwilling to trial       Balance Overall balance assessment: Needs assistance Sitting-balance support: Bilateral upper extremity supported Sitting balance-Leahy Scale: Poor Sitting balance - Comments: requires HHA for sitting balance with decreased trunk control;         Cognition Arousal/Alertness: Lethargic Behavior During Therapy: WFL for tasks assessed/performed Overall Cognitive Status: Within Functional Limits for tasks assessed      General Comments: unable to recall hip precautions. daughter arrived during session and reports pt has baseline cognition deficits      Exercises Other Exercises Other Exercises: Patient required AAROM on RLE for increased ROM. She does have difficulty with ROM on RLE due to weakness and pain;        Pertinent Vitals/Pain Pain Assessment: 0-10 Pain Score: 6  Faces Pain Scale: Hurts little more Pain Location: right hip Pain Descriptors / Indicators: Operative site guarding;Sore Pain Intervention(s): Limited activity within patient's tolerance;Monitored during session;Premedicated before session;Ice applied           PT Goals (current goals can now be found in the care plan section) Acute Rehab PT Goals Patient Stated Goal: go home PT Goal Formulation: With patient Time For Goal Achievement: 04/13/20 Potential to Achieve Goals: Fair Progress towards PT goals: Progressing toward goals    Frequency  BID      PT Plan Current plan remains appropriate       AM-PAC PT "6 Clicks" Mobility   Outcome Measure  Help needed turning from your back to your side while in a flat bed without using bedrails?: A Lot Help needed moving from lying on your back to sitting on the side of a flat bed without using bedrails?: A  Lot Help needed moving to and from a bed to a chair (including a wheelchair)?: A Lot Help needed standing up from a chair using your arms (e.g., wheelchair or bedside chair)?: A Lot Help needed to walk in hospital room?: Total Help needed climbing 3-5 steps with a railing? : Total 6 Click Score: 10    End of Session Equipment Utilized During Treatment: Oxygen Activity Tolerance: Patient limited by pain;Patient limited by fatigue Patient left: in bed;with nursing/sitter in room;with bed alarm set Nurse Communication: Mobility status PT Visit Diagnosis: Muscle weakness (generalized) (M62.81);Difficulty in walking, not elsewhere classified (R26.2);Pain Pain - Right/Left: Right Pain - part of body: Hip     Time: 4730-8569 PT Time Calculation (min) (ACUTE ONLY): 23 min  Charges:  $Therapeutic Exercise: 8-22 mins $Therapeutic Activity: 8-22 mins                     Julaine Fusi PTA 04/01/20, 4:47 PM

## 2020-04-02 LAB — CBC
HCT: 22.1 % — ABNORMAL LOW (ref 36.0–46.0)
Hemoglobin: 7.3 g/dL — ABNORMAL LOW (ref 12.0–15.0)
MCH: 31.1 pg (ref 26.0–34.0)
MCHC: 33 g/dL (ref 30.0–36.0)
MCV: 94 fL (ref 80.0–100.0)
Platelets: 198 10*3/uL (ref 150–400)
RBC: 2.35 MIL/uL — ABNORMAL LOW (ref 3.87–5.11)
RDW: 17.2 % — ABNORMAL HIGH (ref 11.5–15.5)
WBC: 6.8 10*3/uL (ref 4.0–10.5)
nRBC: 0 % (ref 0.0–0.2)

## 2020-04-02 LAB — BASIC METABOLIC PANEL
Anion gap: 12 (ref 5–15)
BUN: 42 mg/dL — ABNORMAL HIGH (ref 8–23)
CO2: 26 mmol/L (ref 22–32)
Calcium: 7.7 mg/dL — ABNORMAL LOW (ref 8.9–10.3)
Chloride: 95 mmol/L — ABNORMAL LOW (ref 98–111)
Creatinine, Ser: 1.73 mg/dL — ABNORMAL HIGH (ref 0.44–1.00)
GFR, Estimated: 30 mL/min — ABNORMAL LOW (ref >60)
Glucose, Bld: 100 mg/dL — ABNORMAL HIGH (ref 70–99)
Potassium: 4.5 mmol/L (ref 3.5–5.1)
Sodium: 133 mmol/L — ABNORMAL LOW (ref 135–145)

## 2020-04-02 LAB — GLUCOSE, CAPILLARY
Glucose-Capillary: 109 mg/dL — ABNORMAL HIGH (ref 70–99)
Glucose-Capillary: 118 mg/dL — ABNORMAL HIGH (ref 70–99)
Glucose-Capillary: 132 mg/dL — ABNORMAL HIGH (ref 70–99)
Glucose-Capillary: 134 mg/dL — ABNORMAL HIGH (ref 70–99)
Glucose-Capillary: 151 mg/dL — ABNORMAL HIGH (ref 70–99)
Glucose-Capillary: 94 mg/dL (ref 70–99)

## 2020-04-02 NOTE — Progress Notes (Signed)
PROGRESS NOTE    Heather Peterson  TKZ:[id number] DOB: 1940-11-22 DOA: 03/28/2020 PCP: Heather Hartigan, MD   Brief Narrative: Taken from H&P  Heather Peterson is a 79 y.o. female with medical history significant ofCAD, combined CHF, CKD, DM, hypothyroid disease, 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 (sees Heather Peterson) presents s/p fall, resulted in comminuted mildly impacted right femoral neck fracture involving the greater trochanter. Orthopedic was consulted-had her hemiarthroplasty on 03/29/2020.  Subjective: Patient has no new complaint today.  She was working with PT when seen today.  She is agreeable to go to SNF before returning home.  Assessment & Plan:   Active Problems:   CHF (congestive heart failure), NYHA class III, chronic, combined (HCC)   Ischemic cardiomyopathy   CKD (chronic kidney disease), stage III (HCC)   Multiple myeloma (HCC)   Hip fracture (HCC)  Right femoral neck fracture secondary to fall. S/p hemiarthroplasty.  There was some concern of hypotension as she was having some dizziness.  No loss of consciousness.  Cardiology was consulted for preop clearance which was done and she went to the OR for hemiarthroplasty yesterday. Cardiology signed off. -Continue with pain management, I discontinued Dilaudid today and we will try Tylenol before giving any opioids as patient is becoming little more somnolent. -PT evaluation -recommending SNF placement. -TOC to work on it  History of HFrEF.  Patient appears euvolemic.  EF of 35 to 40% according to the echo done in 2018.  Cardiology was consulted for preop clearance. -Holding home dose of diuretic due to concern of postural hypotension. -Patient appears euvolemic but did receive 2 units of PRBC. -Monitor intake and output. -Can give Lasix if needed.  Postoperative blood loss anemia. Hemoglobin dropped to 6.2 postoperatively secondary to blood loss. She received 2 units of PRBC. Posttransfusion hemoglobin was  7.9>>7.5>>7.3 -Continue to monitor. -Transfuse if below 7 -Continue iron supplement  Hypotension.  Unable to check orthostatic vitals. -Continue home dose of midodrine. -Careful with pain medications.  AKI with CKD stage IIIb.  Some improvement in her creatinine to 1.73.  Gentle IV hydration. -Continue gentle IV hydration with LR 50 cc an hour for another day. -Need to monitor respiratory status. -Continue to monitor. -Avoid nephrotoxins  Multiple myeloma.  On chemotherapy. -Being managed by outpatient oncology.  Insulin-dependent type 2 diabetes mellitus.  Patient was on Lantus at home. -Continue with SSI  Objective: Vitals:   04/02/20 0550 04/02/20 0700 04/02/20 0743 04/02/20 1138  BP: 97/63  (!) 90/57 (!) 93/54  Pulse: 70  70 69  Resp: 16  18 20   Temp: 97.8 F (36.6 C)  97.6 F (36.4 C) 98.7 F (37.1 C)  TempSrc:   Oral   SpO2: 100%  100% 100%  Weight:  86.2 kg    Height:        Intake/Output Summary (Last 24 hours) at 04/02/2020 1337 Last data filed at 04/02/2020 0818 Gross per 24 hour  Intake 688.43 ml  Output 650 ml  Net 38.43 ml   Filed Weights   03/31/20 0611 04/01/20 0611 04/02/20 0700  Weight: 84.9 kg 80.6 kg 86.2 kg    Examination:  General.  Well-developed lady, in no acute distress. Pulmonary.  Lungs clear bilaterally, normal respiratory effort. CV.  Regular rate and rhythm, no JVD, rub or murmur. Abdomen.  Soft, nontender, nondistended, BS positive. CNS.  Alert and oriented x3.  No focal neurologic deficit. Extremities.  No edema, no cyanosis, pulses intact and symmetrical. Psychiatry.  Judgment and insight  appears normal.  DVT prophylaxis: SCDs Code Status: Full Family Communication: Talk with daughter on phone. Disposition Plan:  Status is: Inpatient  Remains inpatient appropriate because:Inpatient level of care appropriate due to severity of illness   Dispo: The patient is from: Home              Anticipated d/c is to: To be  determined              Anticipated d/c date is: 1 day.              Patient currently is not medically stable to d/c.  Waiting for SNF placement.  Consultants:   Cardiology  Orthopedic  Procedures:  Antimicrobials:   Data Reviewed: I have personally reviewed following labs and imaging studies  CBC: Recent Labs  Lab 03/28/20 1435 03/28/20 1435 03/29/20 0611 03/29/20 2005 03/30/20 0239 03/30/20 0845 03/31/20 0456 04/01/20 0817 04/02/20 0413  WBC 5.7   < > 5.8  --  4.7  --  7.0 7.0 6.8  NEUTROABS 3.9  --   --   --   --   --   --   --   --   HGB 8.0*   < > 8.6*   < > 6.5* 7.9* 7.5* 7.6* 7.3*  HCT 25.7*   < > 27.4*   < > 19.8* 23.5* 22.2* 23.1* 22.1*  MCV 95.2   < > 94.5  --  92.5  --  90.6 92.0 94.0  PLT 95*   < > 103*  --  84*  --  125* 166 198   < > = values in this interval not displayed.   Basic Metabolic Panel: Recent Labs  Lab 03/28/20 1435 03/28/20 1435 03/29/20 0611 03/30/20 0239 03/31/20 0456 04/01/20 0653 04/02/20 0413  NA 136   < > 137 134* 130* 132* 133*  K 2.9*   < > 4.1 3.4* 3.8 4.4 4.5  CL 94*   < > 97* 97* 95* 96* 95*  CO2 32   < > 31 27 25 26 26   GLUCOSE 95   < > 97 174* 85 112* 100*  BUN 42*   < > 33* 27* 36* 42* 42*  CREATININE 1.36*   < > 1.37* 1.41* 1.86* 1.96* 1.73*  CALCIUM 7.5*   < > 8.0* 7.1* 7.3* 8.0* 7.7*  MG 2.6*  --   --   --   --   --   --    < > = values in this interval not displayed.   GFR: Estimated Creatinine Clearance: 28.5 mL/min (A) (by C-G formula based on SCr of 1.73 mg/dL (H)). Liver Function Tests: Recent Labs  Lab 03/28/20 1435  AST 22  ALT 25  ALKPHOS 67  BILITOT 0.8  PROT 5.6*  ALBUMIN 3.3*   No results for input(s): LIPASE, AMYLASE in the last 168 hours. No results for input(s): AMMONIA in the last 168 hours. Coagulation Profile: Recent Labs  Lab 03/28/20 1609 03/29/20 0902  INR 1.3* 1.3*   Cardiac Enzymes: No results for input(s): CKTOTAL, CKMB, CKMBINDEX, TROPONINI in the last 168 hours. BNP  (last 3 results) No results for input(s): PROBNP in the last 8760 hours. HbA1C: No results for input(s): HGBA1C in the last 72 hours. CBG: Recent Labs  Lab 04/01/20 2039 04/02/20 0054 04/02/20 0741 04/02/20 1136 04/02/20 1156  GLUCAP 169* 118* 94 134* 132*   Lipid Profile: No results for input(s): CHOL, HDL, LDLCALC, TRIG, CHOLHDL, LDLDIRECT in the last  72 hours. Thyroid Function Tests: Recent Labs    03/31/20 0456  TSH 0.373   Anemia Panel: Recent Labs    03/31/20 0456  VITAMINB12 652  FOLATE 35.0  FERRITIN 379*  TIBC 304  IRON 18*  RETICCTPCT 2.0   Sepsis Labs: No results for input(s): PROCALCITON, LATICACIDVEN in the last 168 hours.  Recent Results (from the past 240 hour(s))  Respiratory Panel by RT PCR (Flu A&B, Covid) - Nasopharyngeal Swab     Status: None   Collection Time: 03/28/20  3:33 PM   Specimen: Nasopharyngeal Swab  Result Value Ref Range Status   SARS Coronavirus 2 by RT PCR NEGATIVE NEGATIVE Final    Comment: (NOTE) SARS-CoV-2 target nucleic acids are NOT DETECTED.  The SARS-CoV-2 RNA is generally detectable in upper respiratoy specimens during the acute phase of infection. The lowest concentration of SARS-CoV-2 viral copies this assay can detect is 131 copies/mL. A negative result does not preclude SARS-Cov-2 infection and should not be used as the sole basis for treatment or other patient management decisions. A negative result may occur with  improper specimen collection/handling, submission of specimen other than nasopharyngeal swab, presence of viral mutation(s) within the areas targeted by this assay, and inadequate number of viral copies (<131 copies/mL). A negative result must be combined with clinical observations, patient history, and epidemiological information. The expected result is Negative.  Fact Sheet for Patients:  PinkCheek.be  Fact Sheet for Healthcare Providers:    GravelBags.it  This test is no t yet approved or cleared by the [place] FDA and  has been authorized for detection and/or diagnosis of SARS-CoV-2 by FDA under an Emergency Use Authorization (EUA). This EUA will remain  in effect (meaning this test can be used) for the duration of the COVID-19 declaration under Section 564(b)(1) of the Act, 21 U.S.C. section 360bbb-3(b)(1), unless the authorization is terminated or revoked sooner.     Influenza A by PCR NEGATIVE NEGATIVE Final   Influenza B by PCR NEGATIVE NEGATIVE Final    Comment: (NOTE) The Xpert Xpress SARS-CoV-2/FLU/RSV assay is intended as an aid in  the diagnosis of influenza from Nasopharyngeal swab specimens and  should not be used as a sole basis for treatment. Nasal washings and  aspirates are unacceptable for Xpert Xpress SARS-CoV-2/FLU/RSV  testing.  Fact Sheet for Patients: PinkCheek.be  Fact Sheet for Healthcare Providers: GravelBags.it  This test is not yet approved or cleared by the [place] FDA and  has been authorized for detection and/or diagnosis of SARS-CoV-2 by  FDA under an Emergency Use Authorization (EUA). This EUA will remain  in effect (meaning this test can be used) for the duration of the  Covid-19 declaration under Section 564(b)(1) of the Act, 21  U.S.C. section 360bbb-3(b)(1), unless the authorization is  terminated or revoked. Performed at [facility], [address] [place], [place] [address]      Radiology Studies: No results found.  Scheduled Meds: . sodium chloride   Intravenous Once  . allopurinol  50 mg Oral Daily  . aspirin EC  81 mg Oral QHS  . Chlorhexidine Gluconate Cloth  6 each Topical Daily  . docusate sodium  100 mg Oral BID  . enoxaparin (LOVENOX) injection  30 mg Subcutaneous Q24H  . ferrous sulfate  325 mg Oral BID WC  . insulin aspart  0-15 Units Subcutaneous  TID WC  . insulin aspart  0-5 Units Subcutaneous QHS  . levothyroxine  50 mcg Oral Once per  day on Sun Tue Thu Sat   And  . levothyroxine  75 mcg Oral Once per day on Mon Wed Fri  . lidocaine  1 patch Transdermal Q24H  . magnesium oxide  200 mg Oral Daily  . mexiletine  200 mg Oral Q12H  . midodrine  10 mg Oral BID WC  . multivitamin with minerals  1 tablet Oral Daily  . pantoprazole  40 mg Oral Daily  . polyethylene glycol  17 g Oral Daily  . Ensure Max Protein  11 oz Oral BID BM  . simvastatin  20 mg Oral QHS  . traZODone  50 mg Oral QHS   Continuous Infusions: . lactated ringers 50 mL/hr at 04/01/20 1738     LOS: 5 days   Time spent: 25 minutes.  [name], MD [facility]  If 7PM-7AM, please contact night-coverage [email or web address]  04/02/2020, 1:37 PM   This record has been created using Systems analyst. Errors have been sought and corrected,but may not always be located. Such creation errors do not reflect on the standard of care.

## 2020-04-02 NOTE — Progress Notes (Signed)
Physical Therapy Treatment Patient Details Name: Heather Peterson MRN: [id number] DOB: [birth year] Today's Date: 04/02/2020    History of Present Illness 79 y.o. 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 Grayland Ormond) presents s/p fall, R hip fracture.  Now s/p R total hip, posterior approach.    PT Comments    Pt more awake today, feeling better but pain remains a barrier for progression of mobility.  Pt tolerated minimal ROM RLE during ex.  Very limited ab/add which inhibits progression to EOB.  Pt assisted to sit more upright in bed per her request and RN notified of need for pain medication.     Follow Up Recommendations  SNF     Equipment Recommendations  Other (comment) (defer to next level of care)    Recommendations for Other Services       Precautions / Restrictions Precautions Precautions: Fall;Posterior Hip Precaution Booklet Issued: Yes (comment) Restrictions Weight Bearing Restrictions: Yes RLE Weight Bearing: Weight bearing as tolerated    Mobility  Bed Mobility               General bed mobility comments: attemtped but limited by pain  Transfers                    Ambulation/Gait                 Stairs             Wheelchair Mobility    Modified Rankin (Stroke Patients Only)       Balance                                            Cognition Arousal/Alertness: Awake/alert Behavior During Therapy: WFL for tasks assessed/performed Overall Cognitive Status: Within Functional Limits for tasks assessed                                        Exercises Total Joint Exercises Ankle Circles/Pumps: Strengthening;10 reps;Both Quad Sets: Strengthening;10 reps;Both Gluteal Sets: Strengthening;10 reps;Both Heel Slides: AAROM;10 reps;Both (with resisted leg ext) Hip ABduction/ADduction: 10  reps;AROM;AAROM;Both Straight Leg Raises: AAROM;5 reps (unable)    General Comments        Pertinent Vitals/Pain Pain Assessment: Faces Faces Pain Scale: Hurts whole lot Pain Location: right hip Pain Descriptors / Indicators: Operative site guarding;Sore Pain Intervention(s): Limited activity within patient's tolerance;Monitored during session;Repositioned    Home Living                      Prior Function            PT Goals (current goals can now be found in the care plan section) Progress towards PT goals: Not progressing toward goals - comment    Frequency    BID      PT Plan Current plan remains appropriate    Co-evaluation              AM-PAC PT "6 Clicks" Mobility   Outcome Measure  Help needed turning from your back to your side while in a flat bed without using bedrails?: A Lot Help needed moving from lying on your back to sitting on the side of a flat  bed without using bedrails?: A Lot Help needed moving to and from a bed to a chair (including a wheelchair)?: A Lot Help needed standing up from a chair using your arms (e.g., wheelchair or bedside chair)?: A Lot Help needed to walk in hospital room?: Total Help needed climbing 3-5 steps with a railing? : Total 6 Click Score: 10    End of Session Equipment Utilized During Treatment: Oxygen Activity Tolerance: Patient limited by pain;Patient limited by fatigue Patient left: in bed;with nursing/sitter in room;with bed alarm set Nurse Communication: Mobility status PT Visit Diagnosis: Muscle weakness (generalized) (M62.81);Difficulty in walking, not elsewhere classified (R26.2);Pain Pain - Right/Left: Right Pain - part of body: Hip     Time: 0906-0920 PT Time Calculation (min) (ACUTE ONLY): 14 min  Charges:                       Chesley Noon, PTA 04/02/20, 9:34 AM

## 2020-04-02 NOTE — Progress Notes (Signed)
  Subjective: 4 Days Post-Op Procedure(s) (LRB): ARTHROPLASTY BIPOLAR HIP (HEMIARTHROPLASTY) (Right) Patient reports pain as mild.   No complaints this morning. Negative for chest pain and shortness of breath Fever: no Gastrointestinal:Negative for nausea and vomiting  Objective: Vital signs in last 24 hours: Temp:  [97.6 F (36.4 C)-98.4 F (36.9 C)] 97.6 F (36.4 C) (11/21 0743) Pulse Rate:  [69-72] 70 (11/21 0743) Resp:  [16-20] 18 (11/21 0743) BP: (90-101)/(56-63) 90/57 (11/21 0743) SpO2:  [98 %-100 %] 100 % (11/21 0743) Weight:  [86.2 kg] 86.2 kg (11/21 0700)  Intake/Output from previous day:  Intake/Output Summary (Last 24 hours) at 04/02/2020 0940 Last data filed at 04/02/2020 0818 Gross per 24 hour  Intake 688.43 ml  Output 650 ml  Net 38.43 ml    Intake/Output this shift: Total I/O In: 687.3 [I.V.:687.3] Out: -   Labs: Recent Labs    03/31/20 0456 04/01/20 0817 04/02/20 0413  HGB 7.5* 7.6* 7.3*   Recent Labs    04/01/20 0817 04/02/20 0413  WBC 7.0 6.8  RBC 2.51* 2.35*  HCT 23.1* 22.1*  PLT 166 198   Recent Labs    04/01/20 0653 04/02/20 0413  NA 132* 133*  K 4.4 4.5  CL 96* 95*  CO2 26 26  BUN 42* 42*  CREATININE 1.96* 1.73*  GLUCOSE 112* 100*  CALCIUM 8.0* 7.7*   No results for input(s): LABPT, INR in the last 72 hours.   EXAM General - Patient is Alert, Appropriate and Oriented Extremity - ABD soft Sensation intact distally Intact pulses distally Dorsiflexion/Plantar flexion intact Incision: dressing C/D/I No cellulitis present Dressing/Incision - clean, dry, no drainage Motor Function - intact, moving foot and toes well on exam.   Past Medical History:  Diagnosis Date  . Anxiety   . Chronic combined systolic and diastolic CHF (congestive heart failure) ([place])   . Chronic kidney disease   . Coronary artery disease   . Depression   . Diabetes mellitus without complication ([place])   . Diabetes mellitus, type II ([place])   .  Hypertension   . MI (myocardial infarction) ([place])    x 5  . Pacemaker   . Prolonged Q-T interval on ECG   . Thyroid disease     Assessment/Plan: 4 Days Post-Op Procedure(s) (LRB): ARTHROPLASTY BIPOLAR HIP (HEMIARTHROPLASTY) (Right) Active Problems:   CHF (congestive heart failure), NYHA class III, chronic, combined (HCC)   Ischemic cardiomyopathy   CKD (chronic kidney disease), stage III (HCC)   Multiple myeloma (HCC)   Hip fracture (HCC)  Estimated body mass index is 32.62 kg/m as calculated from the following:   Height as of this encounter: 5' 4"  (1.626 m).   Weight as of this encounter: 86.2 kg. Advance diet Up with therapy  Hemoglobin 7.3 -status post 2 transfusions.  Continue with iron supplement and recheck CBC in the morning. Pain well controlled Care management to assist with discharge to skilled nursing facility pending medical clearance  DVT Prophylaxis - Lovenox, Foot Pumps and TED hose Weight-Bearing as tolerated to right leg  T. Rachelle Hora, PA-C [facility] 04/02/2020, 9:40 AM

## 2020-04-03 DIAGNOSIS — E876 Hypokalemia: Secondary | ICD-10-CM

## 2020-04-03 DIAGNOSIS — Z96649 Presence of unspecified artificial hip joint: Secondary | ICD-10-CM

## 2020-04-03 DIAGNOSIS — R778 Other specified abnormalities of plasma proteins: Secondary | ICD-10-CM

## 2020-04-03 DIAGNOSIS — R55 Syncope and collapse: Secondary | ICD-10-CM

## 2020-04-03 DIAGNOSIS — W19XXXA Unspecified fall, initial encounter: Secondary | ICD-10-CM

## 2020-04-03 LAB — CBC
HCT: 21.9 % — ABNORMAL LOW (ref 36.0–46.0)
Hemoglobin: 6.9 g/dL — ABNORMAL LOW (ref 12.0–15.0)
MCH: 30.7 pg (ref 26.0–34.0)
MCHC: 31.5 g/dL (ref 30.0–36.0)
MCV: 97.3 fL (ref 80.0–100.0)
Platelets: 207 10*3/uL (ref 150–400)
RBC: 2.25 MIL/uL — ABNORMAL LOW (ref 3.87–5.11)
RDW: 17.7 % — ABNORMAL HIGH (ref 11.5–15.5)
WBC: 5.8 10*3/uL (ref 4.0–10.5)
nRBC: 0 % (ref 0.0–0.2)

## 2020-04-03 LAB — PREPARE RBC (CROSSMATCH)

## 2020-04-03 LAB — HEMOGLOBIN AND HEMATOCRIT, BLOOD
HCT: 24.8 % — ABNORMAL LOW (ref 36.0–46.0)
Hemoglobin: 8 g/dL — ABNORMAL LOW (ref 12.0–15.0)

## 2020-04-03 LAB — BASIC METABOLIC PANEL
Anion gap: 9 (ref 5–15)
BUN: 34 mg/dL — ABNORMAL HIGH (ref 8–23)
CO2: 28 mmol/L (ref 22–32)
Calcium: 7.6 mg/dL — ABNORMAL LOW (ref 8.9–10.3)
Chloride: 96 mmol/L — ABNORMAL LOW (ref 98–111)
Creatinine, Ser: 1.34 mg/dL — ABNORMAL HIGH (ref 0.44–1.00)
GFR, Estimated: 41 mL/min — ABNORMAL LOW (ref >60)
Glucose, Bld: 105 mg/dL — ABNORMAL HIGH (ref 70–99)
Potassium: 4.3 mmol/L (ref 3.5–5.1)
Sodium: 133 mmol/L — ABNORMAL LOW (ref 135–145)

## 2020-04-03 LAB — GLUCOSE, CAPILLARY
Glucose-Capillary: 85 mg/dL (ref 70–99)
Glucose-Capillary: 98 mg/dL (ref 70–99)
Glucose-Capillary: 120 mg/dL — ABNORMAL HIGH (ref 70–99)

## 2020-04-03 LAB — RESP PANEL BY RT-PCR (FLU A&B, COVID) ARPGX2
Influenza A by PCR: NEGATIVE
Influenza B by PCR: NEGATIVE
SARS Coronavirus 2 by RT PCR: NEGATIVE

## 2020-04-03 MED ORDER — POLYETHYLENE GLYCOL 3350 17 G PO PACK: 17.0000 g | PACK | Freq: Every day | ORAL | 0 refills | Status: DC

## 2020-04-03 MED ORDER — LEVOTHYROXINE SODIUM 50 MCG PO TABS
50.0000 ug | ORAL_TABLET | Freq: Every day | ORAL | Status: DC
Start: 2020-04-03 — End: 2020-06-19

## 2020-04-03 MED ORDER — ENSURE MAX PROTEIN PO LIQD: 11.0000 [oz_av] | Freq: Two times a day (BID) | ORAL | Status: AC

## 2020-04-03 MED ORDER — TRAMADOL HCL 50 MG PO TABS: 50.0000 mg | ORAL_TABLET | Freq: Four times a day (QID) | ORAL | 0 refills | Status: DC | PRN

## 2020-04-03 MED ORDER — MIDODRINE HCL 10 MG PO TABS
10.0000 mg | ORAL_TABLET | Freq: Two times a day (BID) | ORAL | Status: DC
Start: 2020-04-03 — End: 2021-02-20

## 2020-04-03 MED ORDER — FERROUS SULFATE 325 (65 FE) MG PO TABS
325.0000 mg | ORAL_TABLET | Freq: Two times a day (BID) | ORAL | 3 refills | Status: DC
Start: 2020-04-03 — End: 2020-06-13

## 2020-04-03 MED ORDER — ENOXAPARIN SODIUM 30 MG/0.3ML ~~LOC~~ SOLN: 30.0000 mg | SUBCUTANEOUS | Status: DC

## 2020-04-03 MED ORDER — LEVOTHYROXINE SODIUM 75 MCG PO TABS: 75.0000 ug | ORAL_TABLET | Freq: Every day | ORAL | Status: AC

## 2020-04-03 MED ORDER — SODIUM CHLORIDE 0.9% IV SOLUTION: Freq: Once | INTRAVENOUS | Status: AC

## 2020-04-03 MED ORDER — LIDOCAINE 5 % EX PTCH
1.0000 | MEDICATED_PATCH | CUTANEOUS | 0 refills | Status: DC
Start: 2020-04-04 — End: 2020-06-19

## 2020-04-03 MED ORDER — DOCUSATE SODIUM 100 MG PO CAPS
100.0000 mg | ORAL_CAPSULE | Freq: Two times a day (BID) | ORAL | 0 refills | Status: DC
Start: 2020-04-03 — End: 2020-06-19

## 2020-04-03 NOTE — Progress Notes (Signed)
Physical Therapy Treatment Patient Details Name: Heather Peterson MRN: [id number] DOB: 1941/01/09 Today's Date: 04/03/2020    History of Present Illness 79 y.o. 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 Grayland Ormond) presents s/p fall, R hip fracture.  Now s/p R total hip, posterior approach.    PT Comments    Pt alert, agreeable to PT. Reported R hip pain 5/10 at rest, that increased to 8/10 with mobility. Pt continued to show deficits in LE strength, functional mobility, and limited by pain. She was able to perform supine exercises AAROM. Supine to sit with maxA and extended time. Fair sitting balance, able to sit EOB for several minutes with supervision (BP assessed in sitting 90/58, HR 70). Sit <> stand performed twice, maxAx2 but once in standing the patient was able to maintain static standing for several seconds with CGA x2. maxAx2 ultimately to stand pivot to recliner in room, repositioned for comfort and all needs in reach. The patient would benefit from further skilled PT intervention to continue to progress towards goals as able. Recommendation remains appropriate.       Follow Up Recommendations  SNF     Equipment Recommendations  Other (comment) (defer to next level of care)    Recommendations for Other Services       Precautions / Restrictions Precautions Precautions: Fall;Posterior Hip Precaution Booklet Issued: Yes (comment) Restrictions Weight Bearing Restrictions: Yes RLE Weight Bearing: Weight bearing as tolerated    Mobility  Bed Mobility Overal bed mobility: Needs Assistance Bed Mobility: Supine to Sit     Supine to sit: Mod assist;Max assist        Transfers Overall transfer level: Needs assistance Equipment used: Rolling walker (2 wheeled) Transfers: Sit to/from [employer] Transfers Sit to Stand: From elevated surface;+2 safety/equipment;Max  assist Stand pivot transfers: Max assist;+2 physical assistance       General transfer comment: pt able to perform sit <> stand twice. second attempt pt able to transfer to recliner stand pivot maxAx2. Had several seconds of standing with CGA  Ambulation/Gait                 Stairs             Wheelchair Mobility    Modified Rankin (Stroke Patients Only)       Balance Overall balance assessment: Needs assistance Sitting-balance support: Feet supported Sitting balance-Leahy Scale: Fair     Standing balance support: Bilateral upper extremity supported Standing balance-Leahy Scale: Poor Standing balance comment: reliant on walker, significant difficulty clearing feet from the floor                            Cognition Arousal/Alertness: Awake/alert Behavior During Therapy: WFL for tasks assessed/performed Overall Cognitive Status: Within Functional Limits for tasks assessed                                 General Comments: 2/3 hip precautions recalled      Exercises Total Joint Exercises Ankle Circles/Pumps: Strengthening;10 reps;Both Quad Sets: Strengthening;10 reps;Both Gluteal Sets: Strengthening;10 reps;Both Heel Slides: AAROM;10 reps;Right Hip ABduction/ADduction: 10 reps;AROM;AAROM;Right Other Exercises Other Exercises: Patient required AAROM on RLE for increased ROM. She does have difficulty with ROM on RLE due to weakness and pain;    General Comments General comments (skin integrity, edema, etc.): BP  assessed in supine and in sittin no complaints of lightheadedness/dizziness      Pertinent Vitals/Pain Pain Assessment: Faces Faces Pain Scale: Hurts whole lot Pain Location: right hip with mobility Pain Descriptors / Indicators: Operative site guarding;Sore Pain Intervention(s): Limited activity within patient's tolerance;Monitored during session;Repositioned;Premedicated before session    Home Living                       Prior Function            PT Goals (current goals can now be found in the care plan section) Progress towards PT goals: Progressing toward goals (slowly)    Frequency    BID      PT Plan Current plan remains appropriate    Co-evaluation              AM-PAC PT "6 Clicks" Mobility   Outcome Measure  Help needed turning from your back to your side while in a flat bed without using bedrails?: A Lot Help needed moving from lying on your back to sitting on the side of a flat bed without using bedrails?: A Lot Help needed moving to and from a bed to a chair (including a wheelchair)?: A Lot Help needed standing up from a chair using your arms (e.g., wheelchair or bedside chair)?: A Lot Help needed to walk in hospital room?: Total Help needed climbing 3-5 steps with a railing? : Total 6 Click Score: 10    End of Session Equipment Utilized During Treatment: Oxygen Activity Tolerance: Patient limited by pain;Patient limited by fatigue Patient left: in chair;with chair alarm set;with call bell/phone within reach;with SCD's reapplied Nurse Communication: Mobility status PT Visit Diagnosis: Muscle weakness (generalized) (M62.81);Difficulty in walking, not elsewhere classified (R26.2);Pain Pain - Right/Left: Right Pain - part of body: Hip     Time: 0981-1914 PT Time Calculation (min) (ACUTE ONLY): 36 min  Charges:  $Therapeutic Exercise: 23-37 mins                    Lieutenant Diego PT, DPT 12:08 PM,04/03/20

## 2020-04-03 NOTE — Progress Notes (Signed)
Called report to [place] at [facility]. Called for transport.

## 2020-04-03 NOTE — Progress Notes (Signed)
Subjective: 5 Days Post-Op Procedure(s) (LRB): ARTHROPLASTY BIPOLAR HIP (HEMIARTHROPLASTY) (Right) Patient reports pain as mild.   No complaints this morning. Negative for chest pain and shortness of breath Fever: no Gastrointestinal:Negative for nausea and vomiting  Objective: Vital signs in last 24 hours: Temp:  [97.5 F (36.4 C)-98.7 F (37.1 C)] 98.2 F (36.8 C) (11/22 0419) Pulse Rate:  [69-71] 70 (11/22 0419) Resp:  [16-20] 16 (11/22 0419) BP: (92-104)/(52-64) 100/64 (11/22 0419) SpO2:  [98 %-100 %] 98 % (11/22 0419)  Intake/Output from previous day:  Intake/Output Summary (Last 24 hours) at 04/03/2020 0744 Last data filed at 04/03/2020 6073 Gross per 24 hour  Intake 1136.56 ml  Output 250 ml  Net 886.56 ml    Intake/Output this shift: No intake/output data recorded.  Labs: Recent Labs    04/01/20 0817 04/02/20 0413 04/03/20 0229  HGB 7.6* 7.3* 6.9*   Recent Labs    04/02/20 0413 04/03/20 0229  WBC 6.8 5.8  RBC 2.35* 2.25*  HCT 22.1* 21.9*  PLT 198 207   Recent Labs    04/02/20 0413 04/03/20 0229  NA 133* 133*  K 4.5 4.3  CL 95* 96*  CO2 26 28  BUN 42* 34*  CREATININE 1.73* 1.34*  GLUCOSE 100* 105*  CALCIUM 7.7* 7.6*   No results for input(s): LABPT, INR in the last 72 hours.   EXAM General - Patient is Alert, Appropriate and Oriented Extremity - ABD soft Sensation intact distally Intact pulses distally Dorsiflexion/Plantar flexion intact Incision: dressing C/D/I No cellulitis present Dressing/Incision - clean, dry, no drainage Motor Function - intact, moving foot and toes well on exam.   Past Medical History:  Diagnosis Date  . Anxiety   . Chronic combined systolic and diastolic CHF (congestive heart failure) ([place])   . Chronic kidney disease   . Coronary artery disease   . Depression   . Diabetes mellitus without complication ([place])   . Diabetes mellitus, type II ([place])   . Hypertension   . MI (myocardial infarction) ([place])     x 5  . Pacemaker   . Prolonged Q-T interval on ECG   . Thyroid disease     Assessment/Plan: 5 Days Post-Op Procedure(s) (LRB): ARTHROPLASTY BIPOLAR HIP (HEMIARTHROPLASTY) (Right) Active Problems:   CHF (congestive heart failure), NYHA class III, chronic, combined (HCC)   Ischemic cardiomyopathy   CKD (chronic kidney disease), stage III (HCC)   Multiple myeloma (HCC)   Hip fracture (HCC)  Estimated body mass index is 32.62 kg/m as calculated from the following:   Height as of this encounter: 5' 4"  (1.626 m).   Weight as of this encounter: 86.2 kg. Advance diet Up with therapy   Hemoglobin 6.9 -status post 2 transfusions earlier in admission.  Discussed another transfusion with the patient today.  Continue with iron supplement See how she does with PT, if symptomatic proceed with Transfusion. Pain well controlled Care management to assist with discharge to skilled nursing facility pending medical clearance  Plan for follow-up with [facility] in 6 weeks for x-rays of the right hip.  Staples can be removed by SNF on 04/12/20. Continue with Lovenox 58m daily for 14 days following discharge.  DVT Prophylaxis - Lovenox, Foot Pumps and TED hose Weight-Bearing as tolerated to right leg  J. LCameron Proud PA-C [facility] 04/03/2020, 7:44 AM

## 2020-04-03 NOTE — Progress Notes (Signed)
Called [facility] for transport.

## 2020-04-03 NOTE — Discharge Summary (Signed)
Physician Discharge Summary  Heather Peterson MAU:[id number] DOB: 28-Aug-1940 DOA: 03/28/2020  PCP: [name], MD  Admit date: 03/28/2020 Discharge date: 04/03/2020  Admitted From: Home Disposition:  SNF  Recommendations for Outpatient Follow-up:  1. Follow up with PCP in 1-2 weeks 2. Follow-up with oncology for chemotherapy 3. Follow-up with orthopedic surgery in 6 weeks 4. Follow-up with cardiology in 1 to 2 weeks 5. Please obtain BMP/CBC in one week 6. Please follow up on the following pending results:None  Home Health:No Equipment/Devices: Rolling walker, home oxygen Discharge Condition: Stable CODE STATUS: Full Diet recommendation: Heart Healthy / Carb Modified  Brief/Interim Summary: Heather Vanorman Cheletteis a 79 y.o.femalewith medical history significant ofCAD, combined CHF, CKD, DM, hypothyroid disease, Multiple myelomaon chemotherapy (sees Dr. Grayland Ormond) presents s/p fall, resulted in comminuted mildly impacted right femoral neck fracture involving the greater trochanter. Orthopedic was consulted-had her hemiarthroplasty on 03/29/2020. PT recommended SNF placement and patient is being discharged to rehab for further management.  She will continue with Lovenox for 2 weeks per orthopedic recommendations. She will need reinforcement of her dressing as needed. Please remove staples on 04/12/2020 and she will need a follow-up with orthopedic surgery in 6 weeks. Patient is being given Norco and tramadol for pain management.  Please try Tylenol followed by tramadol and use opioids only if above 2 measures are unable to control her pain to avoid excessive sedation.  Patient did develop postoperative blood loss anemia.  She received 2 units on the day of surgery and 1 unit on the day of discharge.  Please monitor her hemoglobin.  She is also been started on iron supplement.  Patient has an history of HFrEF, EF of 35 to 40% according to the echo done in 2018.  We held her  home diuretics for the concern of postural hypotension and little softer blood pressure.  Her home dose of midodrine was increased to 10 mg twice daily and she will continue with home diuretics as an outpatient and follow-up with her cardiologist for further recommendations.  Patient also has an history of multiple myeloma and is on chemotherapy. She will follow-up with her oncology and continue with her chemotherapy as scheduled.  Follow-up home regimen for her diabetes.  Discharge Diagnoses:  Active Problems:   Syncope   CHF (congestive heart failure), NYHA class III, chronic, combined (HCC)   Ischemic cardiomyopathy   CKD (chronic kidney disease), stage III (HCC)   Multiple myeloma (HCC)   Hip fracture (HCC)   Hypokalemia   Fall   Troponin I above reference range   Status post hip hemiarthroplasty   Discharge Instructions  Discharge Instructions    Diet - low sodium heart healthy   Complete by: As directed    Discharge instructions   Complete by: As directed    It was pleasure taking care of you. Your staples will be removed at skilled nursing facility on 04/12/2020. You need to follow-up with orthopedic surgery in 6 weeks. You will be getting Lovenox shots to prevent clotting for 2 weeks. Continue with rest of your medications   Discharge wound care:   Complete by: As directed    Reinforce dressing as needed. Remove staples on 04/12/2020   Increase activity slowly   Complete by: As directed    Leave dressing on - Keep it clean, dry, and intact until clinic visit   Complete by: As directed      Allergies as of 04/03/2020      Reactions   Celebrex [  celecoxib] Anaphylaxis   Glipizide Anaphylaxis   Levaquin [levofloxacin In D5w] Other (See Comments)   Heart arrhthymias   Levofloxacin Other (See Comments), Palpitations   ICD fired   Lisinopril Swelling   Lip and facial swelling   Sulfa Antibiotics Other (See Comments), Anaphylaxis   Reaction: unknown   Metformin  Other (See Comments)   Gi tolerance    Penicillins Rash, Other (See Comments)   Has patient had a PCN reaction causing immediate rash, facial/tongue/throat swelling, SOB or lightheadedness with hypotension: Unknown Has patient had a PCN reaction causing severe rash involving mucus membranes or skin necrosis: No Has patient had a PCN reaction that required hospitalization: No Has patient had a PCN reaction occurring within the last 10 years: No If all of the above answers are "NO", then may proceed with Cephalosporin use.      Medication List    STOP taking these medications   diphenoxylate-atropine 2.5-0.025 MG tablet Commonly known as: LOMOTIL   furosemide 20 MG tablet Commonly known as: LASIX     TAKE these medications   allopurinol 100 MG tablet Commonly known as: ZYLOPRIM Take 50 mg by mouth daily.   aspirin EC 81 MG tablet Take 81 mg by mouth at bedtime.   chlorpheniramine-HYDROcodone 10-8 MG/5ML Suer Commonly known as: TUSSIONEX Take 5 mLs by mouth every 12 (twelve) hours as needed for cough.   colchicine 0.6 MG tablet Take 0.6 mg by mouth 2 (two) times daily.   dexamethasone 4 MG tablet Commonly known as: DECADRON Take 5 tablets (20 mg total) by mouth daily. Take the day after Velcade on days 2,5,9,12. Take with breakfast   docusate sodium 100 MG capsule Commonly known as: COLACE Take 1 capsule (100 mg total) by mouth 2 (two) times daily.   enoxaparin 40 MG/0.4ML injection Commonly known as: LOVENOX Inject 0.4 mLs (40 mg total) into the skin daily.   enoxaparin 30 MG/0.3ML injection Commonly known as: LOVENOX Inject 0.3 mLs (30 mg total) into the skin daily for 14 days. For 14 days.   Ensure Max Protein Liqd Take 330 mLs (11 oz total) by mouth 2 (two) times daily between meals.   ferrous sulfate 325 (65 FE) MG tablet Take 1 tablet (325 mg total) by mouth 2 (two) times daily with a meal.   fexofenadine 180 MG tablet Commonly known as: ALLEGRA Take 180  mg by mouth daily.   FLUoxetine 10 MG capsule Commonly known as: PROZAC Take 10 mg by mouth daily.   folic acid 1 MG tablet Commonly known as: FOLVITE folic acid 1 mg tablet   HYDROcodone-acetaminophen 5-325 MG tablet Commonly known as: NORCO/VICODIN Take 1-2 tablets by mouth every 4 (four) hours as needed for moderate pain.   hydrocortisone 2.5 % rectal cream Commonly known as: ANUSOL-HC Apply 1 application topically 2 (two) times daily.   Lantus SoloStar 100 UNIT/ML Solostar Pen Generic drug: insulin glargine Inject 16 Units into the skin at bedtime.   lenalidomide 10 MG capsule Commonly known as: REVLIMID Take 1 capsule (10 mg total) by mouth daily. Take 14 days on, 7 days off, repeat every 21 days   levothyroxine 50 MCG tablet Commonly known as: SYNTHROID Take 1 tablet (50 mcg total) by mouth daily before breakfast. On Tuesday, Thursday, Saturday and Sunday What changed:   medication strength  how much to take  when to take this  additional instructions   levothyroxine 75 MCG tablet Commonly known as: SYNTHROID Take 1 tablet (75 mcg total) by  mouth daily before breakfast. On Monday, Wednesday and Friday What changed:   medication strength  how much to take  when to take this  additional instructions   lidocaine 5 % Commonly known as: LIDODERM Place 1 patch onto the skin daily. Remove & Discard patch within 12 hours or as directed by MD Start taking on: April 04, 2020   magnesium oxide 400 MG tablet Commonly known as: MAG-OX Take 200 mg by mouth daily.   mexiletine 200 MG capsule Commonly known as: MEXITIL Take 1 capsule (200 mg total) by mouth every 12 (twelve) hours.   midodrine 10 MG tablet Commonly known as: PROAMATINE Take 1 tablet (10 mg total) by mouth 2 (two) times daily with a meal. What changed:   medication strength  how much to take  when to take this  reasons to take this   nystatin cream Commonly known as:  MYCOSTATIN Apply 1 application topically 2 (two) times daily.   ondansetron 8 MG tablet Commonly known as: Zofran Take 1 tablet (8 mg total) by mouth 2 (two) times daily as needed (Nausea or vomiting).   OXYGEN Inhale 2 L into the lungs continuous.   pantoprazole 40 MG tablet Commonly known as: PROTONIX Take 40 mg by mouth daily.   polyethylene glycol 17 g packet Commonly known as: MIRALAX / GLYCOLAX Take 17 g by mouth daily.   potassium chloride SA 20 MEQ tablet Commonly known as: KLOR-CON Take 20 mEq by mouth daily.   prenatal multivitamin Tabs tablet Take 1 tablet by mouth daily.   prochlorperazine 10 MG tablet Commonly known as: COMPAZINE Take 1 tablet (10 mg total) by mouth every 6 (six) hours as needed (Nausea or vomiting).   simvastatin 40 MG tablet Commonly known as: ZOCOR Take 20 mg by mouth at bedtime.   spironolactone 25 MG tablet Commonly known as: ALDACTONE Take 25 mg by mouth daily.   sucralfate 1 g tablet Commonly known as: CARAFATE Take 1 tablet by mouth 3 (three) times daily before meals.   torsemide 20 MG tablet Commonly known as: DEMADEX Take 2 tablets (40 mg total) by mouth daily. Future refills need to come from kidney doctor   traMADol 50 MG tablet Commonly known as: ULTRAM Take 1 tablet (50 mg total) by mouth every 6 (six) hours as needed for moderate pain.   traZODone 50 MG tablet Commonly known as: DESYREL Take 50 mg by mouth at bedtime.            Discharge Care Instructions  (From admission, onward)         Start     Ordered   04/03/20 0000  Leave dressing on - Keep it clean, dry, and intact until clinic visit        04/03/20 1231   04/03/20 0000  Discharge wound care:       Comments: Reinforce dressing as needed. Remove staples on 04/12/2020   04/03/20 1231          Contact information for follow-up providers    Lattie Corns, PA-C Follow up in 6 week(s).   Specialty: Physician Assistant Why: Jodell Cipro can be  removed by SNF.  Follow-up with [facility] in 6 weeks for x-rays. Contact information: [place] [address] [phone]        [name], MD. Schedule an appointment as soon as possible for a visit in 1 week(s).   Specialty: Family Medicine Contact information: [place]  [place] [address] [phone]            Contact information for after-discharge care    Destination    [facility] Preferred SNF .   Service: Skilled Nursing Contact information: [place] [phone]                 Allergies  Allergen Reactions  . Celebrex [Celecoxib] Anaphylaxis  . Glipizide Anaphylaxis  . Levaquin [Levofloxacin In D5w] Other (See Comments)    Heart arrhthymias  . Levofloxacin Other (See Comments) and Palpitations    ICD fired  . Lisinopril Swelling    Lip and facial swelling  . Sulfa Antibiotics Other (See Comments) and Anaphylaxis    Reaction: unknown  . Metformin Other (See Comments)    Gi tolerance   . Penicillins Rash and Other (See Comments)    Has patient had a PCN reaction causing immediate rash, facial/tongue/throat swelling, SOB or lightheadedness with hypotension: Unknown Has patient had a PCN reaction causing severe rash involving mucus membranes or skin necrosis: No Has patient had a PCN reaction that required hospitalization: No Has patient had a PCN reaction occurring within the last 10 years: No If all of the above answers are "NO", then may proceed with Cephalosporin use.     Consultations:  Orthopedic  Cardiology  Procedures/Studies: DG Chest 1 View  Result Date: 03/28/2020 CLINICAL DATA:  Fall.  Landed on her RIGHT side.  RIGHT hip pain. EXAM: CHEST  1 VIEW COMPARISON:  03/15/2020 FINDINGS: Heart is enlarged, stable in configuration. LEFT-sided AICD is unchanged. No focal consolidations or pleural effusions. No  pulmonary edema. Atherosclerotic calcification of the thoracic aorta. IMPRESSION: Stable cardiomegaly. Electronically Signed   By: Nolon Nations M.D.   On: 03/28/2020 15:38   DG Chest 2 View  Result Date: 03/15/2020 CLINICAL DATA:  79 year old female with cough for 2-3 months and drainage. Multiple myeloma. EXAM: CHEST - 2 VIEW COMPARISON:  Chest radiographs 08/27/2019 and earlier. FINDINGS: Chronic cardiomegaly, left chest AICD. Improved lung volumes, and resolved small pleural effusions since April. No pneumothorax. Stable pulmonary vascularity, no overt edema. No confluent pulmonary opacity. Calcified aortic atherosclerosis. Stable visualized osseous structures. Negative visible bowel gas pattern. IMPRESSION: Cardiomegaly. Resolved pleural effusions since April. No acute cardiopulmonary abnormality. Electronically Signed   By: Genevie Ann M.D.   On: 03/15/2020 12:14   DG Thoracic Spine 2 View  Result Date: 03/31/2020 CLINICAL DATA:  Generalized back pain for few days. RIGHT hip arthroplasty on 03/29/2020 EXAM: THORACIC SPINE 2 VIEWS COMPARISON:  Previous CT imaging is available for comparison. FINDINGS: Pacer defibrillator leads project over the central chest. Osteopenia. No sign of acute fracture or static malalignment of the thoracic spine. Signs of coronary artery disease and prior coronary artery stenting. Vascular calcifications in the thoracic and abdominal aorta. IMPRESSION: 1. No signs of acute fracture or static malalignment. 2. Signs of vascular disease, coronary artery disease and coronary artery stenting. Electronically Signed   By: Zetta Bills M.D.   On: 03/31/2020 08:13   DG Lumbar Spine 2-3 Views  Result Date: 03/31/2020 CLINICAL DATA:  Generalized back pain for few days. RIGHT hip arthroplasty on 03/29/2020. Back pain since surgery. EXAM: LUMBAR SPINE - 2-3 VIEW COMPARISON:  CT of the abdomen and pelvis from March of 2021 in metastatic bone survey from October of 2021 FINDINGS:  Lumbar spine with degenerative changes. Six lumbar type vertebral bodies. Osteopenia. Vertebral body heights are maintained with disc space  narrowing at multiple levels greatest at L1-L2 and L2-L3. Mild anterolisthesis of the fourth on the fifth lumbar vertebral body approximately 2 mm to 3 mm is similar to previous imaging with mild retrolisthesis of the fifth on the sixth lumbar type vertebral body. Disc space narrowing also pronounced at the fifth and sixth lumbar level and at the lumbosacral transition. Facet arthropathy is demonstrated. There may be new anterior translation, anterolisthesis of the sixth lumbar vertebral level on the first sacral type vertebral body approximately 2-3 mm since the previous exam. No sign of acute fracture in the lumbar spine. Incidental note is made of cholecystectomy clips in the RIGHT upper quadrant and renal calculus projecting over the LEFT renal contour approximately 5 mm seen also on the prior CT study from March of 2021 IMPRESSION: Multilevel degenerative changes greatest in lower lumbar spine with potential new mild anterolisthesis of the lowest lumbar vertebral elements on the first sacral vertebral body no signs of fracture. Correlate with radicular symptoms and cross-sectional imaging as warranted. LEFT renal calculus. Electronically Signed   By: Zetta Bills M.D.   On: 03/31/2020 08:23   CT Head Wo Contrast  Result Date: 03/28/2020 CLINICAL DATA:  Trauma. EXAM: CT HEAD WITHOUT CONTRAST CT CERVICAL SPINE WITHOUT CONTRAST TECHNIQUE: Multidetector CT imaging of the head and cervical spine was performed following the standard protocol without intravenous contrast. Multiplanar CT image reconstructions of the cervical spine were also generated. COMPARISON:  None. FINDINGS: CT HEAD FINDINGS Brain: No evidence of acute large vascular territory infarction, hemorrhage, hydrocephalus, extra-axial collection or mass lesion/mass effect. Remote right frontal infarct with  encephalomalacia. Patchy white matter hypoattenuation, compatible with chronic microvascular ischemic disease. Generalized cerebral atrophy. Vascular: Calcific atherosclerosis. Skull: No acute fracture Sinuses/Orbits: Visualized sinuses are clear. Unremarkable visualized orbits. Other: No mastoid effusions. CT CERVICAL SPINE FINDINGS Alignment: Mild reversal of the normal cervical lordosis, likely positional. Mild anterolisthesis of C3 on C4, likely degenerative given facet arthropathy at this level. Otherwise, no substantial subluxation. Skull base and vertebrae: No acute fracture. Vertebral body heights are maintained. Soft tissues and spinal canal: No prevertebral fluid or swelling. No visible canal hematoma. Disc levels: Mild multilevel degenerative disc disease. Multilevel facet arthropathy, greatest at C3-C4. Upper chest: Emphysema. Other: Calcific atherosclerosis of the carotids. IMPRESSION: CT head: 1. No evidence of acute intracranial abnormality. 2. Remote right frontal infarct with encephalomalacia. 3. Chronic microvascular ischemic disease. CT cervical spine: No evidence of acute fracture or traumatic malalignment. Electronically Signed   By: Margaretha Sheffield MD   On: 03/28/2020 15:45   CT Cervical Spine Wo Contrast  Result Date: 03/28/2020 CLINICAL DATA:  Trauma. EXAM: CT HEAD WITHOUT CONTRAST CT CERVICAL SPINE WITHOUT CONTRAST TECHNIQUE: Multidetector CT imaging of the head and cervical spine was performed following the standard protocol without intravenous contrast. Multiplanar CT image reconstructions of the cervical spine were also generated. COMPARISON:  None. FINDINGS: CT HEAD FINDINGS Brain: No evidence of acute large vascular territory infarction, hemorrhage, hydrocephalus, extra-axial collection or mass lesion/mass effect. Remote right frontal infarct with encephalomalacia. Patchy white matter hypoattenuation, compatible with chronic microvascular ischemic disease. Generalized cerebral  atrophy. Vascular: Calcific atherosclerosis. Skull: No acute fracture Sinuses/Orbits: Visualized sinuses are clear. Unremarkable visualized orbits. Other: No mastoid effusions. CT CERVICAL SPINE FINDINGS Alignment: Mild reversal of the normal cervical lordosis, likely positional. Mild anterolisthesis of C3 on C4, likely degenerative given facet arthropathy at this level. Otherwise, no substantial subluxation. Skull base and vertebrae: No acute fracture. Vertebral body heights are maintained. Soft tissues  and spinal canal: No prevertebral fluid or swelling. No visible canal hematoma. Disc levels: Mild multilevel degenerative disc disease. Multilevel facet arthropathy, greatest at C3-C4. Upper chest: Emphysema. Other: Calcific atherosclerosis of the carotids. IMPRESSION: CT head: 1. No evidence of acute intracranial abnormality. 2. Remote right frontal infarct with encephalomalacia. 3. Chronic microvascular ischemic disease. CT cervical spine: No evidence of acute fracture or traumatic malalignment. Electronically Signed   By: Margaretha Sheffield MD   On: 03/28/2020 15:45   US Abdomen Complete  Result Date: 03/11/2020 CLINICAL DATA:  Cirrhosis EXAM: ABDOMEN ULTRASOUND COMPLETE COMPARISON:  CT 07/16/2019 FINDINGS: Gallbladder: Prior cholecystectomy Common bile duct: Diameter: Normal caliber, 3 mm Liver: Diffusely increased echotexture and nodular contours. No focal hepatic abnormality. Portal vein is patent on color Doppler imaging with normal direction of blood flow towards the liver. IVC: No abnormality visualized. Pancreas: Visualized portion unremarkable. Spleen: Size and appearance within normal limits. Right Kidney: Length: 8.4 cm. Echogenicity within normal limits. No mass or hydronephrosis visualized. Left Kidney: Length: 8.8 cm. Echogenicity within normal limits. No mass or hydronephrosis visualized. Abdominal aorta: No aneurysm visualized. Other findings: Moderate ascites. IMPRESSION: Changes of hepatic  steatosis and cirrhosis. Moderate ascites. No focal hepatic abnormality. Electronically Signed   By: Rolm Baptise M.D.   On: 03/11/2020 20:44   DG HIP UNILAT W OR W/O PELVIS 2-3 VIEWS RIGHT  Result Date: 03/29/2020 CLINICAL DATA:  Postop from right hip arthroplasty. EXAM: DG HIP (WITH OR WITHOUT PELVIS) 2-3V RIGHT COMPARISON:  None. FINDINGS: A right hip prosthesis is seen in expected position. No evidence of fracture or dislocation. IMPRESSION: Expected postoperative appearance of right hip prosthesis. Electronically Signed   By: Marlaine Hind M.D.   On: 03/29/2020 20:20   DG Hip Unilat W or Wo Pelvis 2-3 Views Right  Result Date: 03/28/2020 CLINICAL DATA:  Fall EXAM: DG HIP (WITH OR WITHOUT PELVIS) 2-3V RIGHT COMPARISON:  None. FINDINGS: There is a comminuted impacted minimally displaced fracture of the right transcervical femoral neck involving the greater trochanter. The femoral head is still well seated within the acetabulum. Overlying soft tissue swelling is noted. IMPRESSION: Comminuted mildly impacted right transcervical femoral neck fracture involving the greater trochanter. Electronically Signed   By: Prudencio Pair M.D.   On: 03/28/2020 15:38    Subjective: Patient was seen and examined today.  She was sitting comfortably in chair.  No new complaint.  We discussed getting another unit of blood before leaving and she agrees.  Discharge Exam: Vitals:   04/03/20 0751 04/03/20 1146  BP: (!) 96/56 (!) 93/54  Pulse: 70 69  Resp: 18 19  Temp: 98 F (36.7 C) 97.9 F (36.6 C)  SpO2: 100% 100%   Vitals:   04/02/20 2355 04/03/20 0419 04/03/20 0751 04/03/20 1146  BP: 92/61 100/64 (!) 96/56 (!) 93/54  Pulse: 70 70 70 69  Resp:  16 18 19   Temp: (!) 97.5 F (36.4 C) 98.2 F (36.8 C) 98 F (36.7 C) 97.9 F (36.6 C)  TempSrc: Oral     SpO2: 99% 98% 100% 100%  Weight:      Height:        General: Pt is alert, awake, not in acute distress Cardiovascular: RRR, S1/S2 +, no rubs, no  gallops Respiratory: CTA bilaterally, no wheezing, no rhonchi Abdominal: Soft, NT, ND, bowel sounds + Extremities: no edema, no cyanosis   The results of significant diagnostics from this hospitalization (including imaging, microbiology, ancillary and laboratory) are listed below for reference.  Microbiology: Recent Results (from the past 240 hour(s))  Respiratory Panel by RT PCR (Flu A&B, Covid) - Nasopharyngeal Swab     Status: None   Collection Time: 03/28/20  3:33 PM   Specimen: Nasopharyngeal Swab  Result Value Ref Range Status   SARS Coronavirus 2 by RT PCR NEGATIVE NEGATIVE Final    Comment: (NOTE) SARS-CoV-2 target nucleic acids are NOT DETECTED.  The SARS-CoV-2 RNA is generally detectable in upper respiratoy specimens during the acute phase of infection. The lowest concentration of SARS-CoV-2 viral copies this assay can detect is 131 copies/mL. A negative result does not preclude SARS-Cov-2 infection and should not be used as the sole basis for treatment or other patient management decisions. A negative result may occur with  improper specimen collection/handling, submission of specimen other than nasopharyngeal swab, presence of viral mutation(s) within the areas targeted by this assay, and inadequate number of viral copies (<131 copies/mL). A negative result must be combined with clinical observations, patient history, and epidemiological information. The expected result is Negative.  Fact Sheet for Patients:  PinkCheek.be  Fact Sheet for Healthcare Providers:  GravelBags.it  This test is no t yet approved or cleared by the [place] FDA and  has been authorized for detection and/or diagnosis of SARS-CoV-2 by FDA under an Emergency Use Authorization (EUA). This EUA will remain  in effect (meaning this test can be used) for the duration of the COVID-19 declaration under Section 564(b)(1) of the Act, 21  U.S.C. section 360bbb-3(b)(1), unless the authorization is terminated or revoked sooner.     Influenza A by PCR NEGATIVE NEGATIVE Final   Influenza B by PCR NEGATIVE NEGATIVE Final    Comment: (NOTE) The Xpert Xpress SARS-CoV-2/FLU/RSV assay is intended as an aid in  the diagnosis of influenza from Nasopharyngeal swab specimens and  should not be used as a sole basis for treatment. Nasal washings and  aspirates are unacceptable for Xpert Xpress SARS-CoV-2/FLU/RSV  testing.  Fact Sheet for Patients: PinkCheek.be  Fact Sheet for Healthcare Providers: GravelBags.it  This test is not yet approved or cleared by the [place] FDA and  has been authorized for detection and/or diagnosis of SARS-CoV-2 by  FDA under an Emergency Use Authorization (EUA). This EUA will remain  in effect (meaning this test can be used) for the duration of the  Covid-19 declaration under Section 564(b)(1) of the Act, 21  U.S.C. section 360bbb-3(b)(1), unless the authorization is  terminated or revoked. Performed at [facility], [address] [place], [place] [address]   Resp Panel by RT-PCR (Flu A&B, Covid)     Status: None   Collection Time: 04/03/20 11:30 AM   Specimen: Nasopharyngeal(NP) swabs in vial transport medium  Result Value Ref Range Status   SARS Coronavirus 2 by RT PCR NEGATIVE NEGATIVE Final    Comment: (NOTE) SARS-CoV-2 target nucleic acids are NOT DETECTED.  The SARS-CoV-2 RNA is generally detectable in upper respiratory specimens during the acute phase of infection. The lowest concentration of SARS-CoV-2 viral copies this assay can detect is 138 copies/mL. A negative result does not preclude SARS-Cov-2 infection and should not be used as the sole basis for treatment or other patient management decisions. A negative result may occur with  improper specimen collection/handling, submission of specimen other than  nasopharyngeal swab, presence of viral mutation(s) within the areas targeted by this assay, and inadequate number of viral copies(<138 copies/mL). A negative result must be combined with clinical observations, patient history, and epidemiological information.  The expected result is Negative.  Fact Sheet for Patients:  EntrepreneurPulse.com.au  Fact Sheet for Healthcare Providers:  IncredibleEmployment.be  This test is no t yet approved or cleared by the [place] FDA and  has been authorized for detection and/or diagnosis of SARS-CoV-2 by FDA under an Emergency Use Authorization (EUA). This EUA will remain  in effect (meaning this test can be used) for the duration of the COVID-19 declaration under Section 564(b)(1) of the Act, 21 U.S.C.section 360bbb-3(b)(1), unless the authorization is terminated  or revoked sooner.       Influenza A by PCR NEGATIVE NEGATIVE Final   Influenza B by PCR NEGATIVE NEGATIVE Final    Comment: (NOTE) The Xpert Xpress SARS-CoV-2/FLU/RSV plus assay is intended as an aid in the diagnosis of influenza from Nasopharyngeal swab specimens and should not be used as a sole basis for treatment. Nasal washings and aspirates are unacceptable for Xpert Xpress SARS-CoV-2/FLU/RSV testing.  Fact Sheet for Patients: EntrepreneurPulse.com.au  Fact Sheet for Healthcare Providers: IncredibleEmployment.be  This test is not yet approved or cleared by the [place] FDA and has been authorized for detection and/or diagnosis of SARS-CoV-2 by FDA under an Emergency Use Authorization (EUA). This EUA will remain in effect (meaning this test can be used) for the duration of the COVID-19 declaration under Section 564(b)(1) of the Act, 21 U.S.C. section 360bbb-3(b)(1), unless the authorization is terminated or revoked.  Performed at [facility], [address] [place], [place]  [address]      Labs: BNP (last 3 results) Recent Labs    08/27/19 1848 09/04/19 1759 03/28/20 1435  BNP 714.0* 440.0* 621.3*   Basic Metabolic Panel: Recent Labs  Lab 03/28/20 1435 03/29/20 0611 03/30/20 0239 03/31/20 0456 04/01/20 0653 04/02/20 0413 04/03/20 0229  NA 136   < > 134* 130* 132* 133* 133*  K 2.9*   < > 3.4* 3.8 4.4 4.5 4.3  CL 94*   < > 97* 95* 96* 95* 96*  CO2 32   < > 27 25 26 26 28   GLUCOSE 95   < > 174* 85 112* 100* 105*  BUN 42*   < > 27* 36* 42* 42* 34*  CREATININE 1.36*   < > 1.41* 1.86* 1.96* 1.73* 1.34*  CALCIUM 7.5*   < > 7.1* 7.3* 8.0* 7.7* 7.6*  MG 2.6*  --   --   --   --   --   --    < > = values in this interval not displayed.   Liver Function Tests: Recent Labs  Lab 03/28/20 1435  AST 22  ALT 25  ALKPHOS 67  BILITOT 0.8  PROT 5.6*  ALBUMIN 3.3*   No results for input(s): LIPASE, AMYLASE in the last 168 hours. No results for input(s): AMMONIA in the last 168 hours. CBC: Recent Labs  Lab 03/28/20 1435 03/29/20 0611 03/30/20 0239 03/30/20 0239 03/30/20 0845 03/31/20 0456 04/01/20 0817 04/02/20 0413 04/03/20 0229  WBC 5.7   < > 4.7  --   --  7.0 7.0 6.8 5.8  NEUTROABS 3.9  --   --   --   --   --   --   --   --   HGB 8.0*   < > 6.5*   < > 7.9* 7.5* 7.6* 7.3* 6.9*  HCT 25.7*   < > 19.8*   < > 23.5* 22.2* 23.1* 22.1* 21.9*  MCV 95.2   < > 92.5  --   --  90.6 92.0 94.0 97.3  PLT 95*   < > 84*  --   --  125* 166 198 207   < > = values in this interval not displayed.   Cardiac Enzymes: No results for input(s): CKTOTAL, CKMB, CKMBINDEX, TROPONINI in the last 168 hours. BNP: Invalid input(s): POCBNP CBG: Recent Labs  Lab 04/02/20 1156 04/02/20 1526 04/02/20 2129 04/03/20 0753 04/03/20 1220  GLUCAP 132* 151* 109* 85 98   D-Dimer No results for input(s): DDIMER in the last 72 hours. Hgb A1c No results for input(s): HGBA1C in the last 72 hours. Lipid Profile No results for input(s): CHOL, HDL, LDLCALC, TRIG, CHOLHDL,  LDLDIRECT in the last 72 hours. Thyroid function studies No results for input(s): TSH, T4TOTAL, T3FREE, THYROIDAB in the last 72 hours.  Invalid input(s): FREET3 Anemia work up No results for input(s): VITAMINB12, FOLATE, FERRITIN, TIBC, IRON, RETICCTPCT in the last 72 hours. Urinalysis    Component Value Date/Time   COLORURINE YELLOW 09/04/2019 1800   APPEARANCEUR CLEAR 09/04/2019 1800   LABSPEC 1.015 09/04/2019 1800   PHURINE 7.5 09/04/2019 1800   GLUCOSEU NEGATIVE 09/04/2019 1800   HGBUR LARGE (A) 09/04/2019 1800   BILIRUBINUR NEGATIVE 09/04/2019 1800   KETONESUR NEGATIVE 09/04/2019 1800   PROTEINUR 30 (A) 09/04/2019 1800   NITRITE NEGATIVE 09/04/2019 1800   LEUKOCYTESUR SMALL (A) 09/04/2019 1800   Sepsis Labs Invalid input(s): PROCALCITONIN,  WBC,  LACTICIDVEN Microbiology Recent Results (from the past 240 hour(s))  Respiratory Panel by RT PCR (Flu A&B, Covid) - Nasopharyngeal Swab     Status: None   Collection Time: 03/28/20  3:33 PM   Specimen: Nasopharyngeal Swab  Result Value Ref Range Status   SARS Coronavirus 2 by RT PCR NEGATIVE NEGATIVE Final    Comment: (NOTE) SARS-CoV-2 target nucleic acids are NOT DETECTED.  The SARS-CoV-2 RNA is generally detectable in upper respiratoy specimens during the acute phase of infection. The lowest concentration of SARS-CoV-2 viral copies this assay can detect is 131 copies/mL. A negative result does not preclude SARS-Cov-2 infection and should not be used as the sole basis for treatment or other patient management decisions. A negative result may occur with  improper specimen collection/handling, submission of specimen other than nasopharyngeal swab, presence of viral mutation(s) within the areas targeted by this assay, and inadequate number of viral copies (<131 copies/mL). A negative result must be combined with clinical observations, patient history, and epidemiological information. The expected result is Negative.  Fact  Sheet for Patients:  PinkCheek.be  Fact Sheet for Healthcare Providers:  GravelBags.it  This test is no t yet approved or cleared by the [place] FDA and  has been authorized for detection and/or diagnosis of SARS-CoV-2 by FDA under an Emergency Use Authorization (EUA). This EUA will remain  in effect (meaning this test can be used) for the duration of the COVID-19 declaration under Section 564(b)(1) of the Act, 21 U.S.C. section 360bbb-3(b)(1), unless the authorization is terminated or revoked sooner.     Influenza A by PCR NEGATIVE NEGATIVE Final   Influenza B by PCR NEGATIVE NEGATIVE Final    Comment: (NOTE) The Xpert Xpress SARS-CoV-2/FLU/RSV assay is intended as an aid in  the diagnosis of influenza from Nasopharyngeal swab specimens and  should not be used as a sole basis for treatment. Nasal washings and  aspirates are unacceptable for Xpert Xpress SARS-CoV-2/FLU/RSV  testing.  Fact Sheet for Patients: PinkCheek.be  Fact Sheet for Healthcare Providers: GravelBags.it  This test is not yet approved  or cleared by the [place] and  has been authorized for detection and/or diagnosis of SARS-CoV-2 by  FDA under an Emergency Use Authorization (EUA). This EUA will remain  in effect (meaning this test can be used) for the duration of the  Covid-19 declaration under Section 564(b)(1) of the Act, 21  U.S.C. section 360bbb-3(b)(1), unless the authorization is  terminated or revoked. Performed at [facility], [address] [place], [place] [address]   Resp Panel by RT-PCR (Flu A&B, Covid)     Status: None   Collection Time: 04/03/20 11:30 AM   Specimen: Nasopharyngeal(NP) swabs in vial transport medium  Result Value Ref Range Status   SARS Coronavirus 2 by RT PCR NEGATIVE NEGATIVE Final    Comment: (NOTE) SARS-CoV-2 target nucleic acids  are NOT DETECTED.  The SARS-CoV-2 RNA is generally detectable in upper respiratory specimens during the acute phase of infection. The lowest concentration of SARS-CoV-2 viral copies this assay can detect is 138 copies/mL. A negative result does not preclude SARS-Cov-2 infection and should not be used as the sole basis for treatment or other patient management decisions. A negative result may occur with  improper specimen collection/handling, submission of specimen other than nasopharyngeal swab, presence of viral mutation(s) within the areas targeted by this assay, and inadequate number of viral copies(<138 copies/mL). A negative result must be combined with clinical observations, patient history, and epidemiological information. The expected result is Negative.  Fact Sheet for Patients:  EntrepreneurPulse.com.au  Fact Sheet for Healthcare Providers:  IncredibleEmployment.be  This test is no t yet approved or cleared by the [place] FDA and  has been authorized for detection and/or diagnosis of SARS-CoV-2 by FDA under an Emergency Use Authorization (EUA). This EUA will remain  in effect (meaning this test can be used) for the duration of the COVID-19 declaration under Section 564(b)(1) of the Act, 21 U.S.C.section 360bbb-3(b)(1), unless the authorization is terminated  or revoked sooner.       Influenza A by PCR NEGATIVE NEGATIVE Final   Influenza B by PCR NEGATIVE NEGATIVE Final    Comment: (NOTE) The Xpert Xpress SARS-CoV-2/FLU/RSV plus assay is intended as an aid in the diagnosis of influenza from Nasopharyngeal swab specimens and should not be used as a sole basis for treatment. Nasal washings and aspirates are unacceptable for Xpert Xpress SARS-CoV-2/FLU/RSV testing.  Fact Sheet for Patients: EntrepreneurPulse.com.au  Fact Sheet for Healthcare Providers: IncredibleEmployment.be  This test is  not yet approved or cleared by the [place] FDA and has been authorized for detection and/or diagnosis of SARS-CoV-2 by FDA under an Emergency Use Authorization (EUA). This EUA will remain in effect (meaning this test can be used) for the duration of the COVID-19 declaration under Section 564(b)(1) of the Act, 21 U.S.C. section 360bbb-3(b)(1), unless the authorization is terminated or revoked.  Performed at [facility], [address] [place],  [address]     Time coordinating discharge: Over 30 minutes  SIGNED:  [name], MD  [facility] 04/03/2020, 12:34 PM  If 7PM-7AM, please contact night-coverage [email or web address]  This record has been created using Systems analyst. Errors have been sought and corrected,but may not always be located. Such creation errors do not reflect on the standard of care.

## 2020-04-03 NOTE — Progress Notes (Signed)
EMS to transport 

## 2020-04-03 NOTE — Progress Notes (Signed)
Physical Therapy Treatment Patient Details Name: Heather Peterson MRN: [id number] DOB: [birth year] Today's Date: 04/03/2020    History of Present Illness 79 y.o. 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 Grayland Ormond) presents s/p fall, R hip fracture.  Now s/p R total hip, posterior approach.    PT Comments    Pt alert, in chair, CNAs in room. Pt had just finished using bed pan in chair, fatigued and painful due to this. Pt more limited this PM session due to pain. The patient performed sit to stand with maxAx2, extended time needed for LE repositioning and hand placement on RW. Stand pivot to bed with RW and maxAx2 initially, half way through the transfer the patient had bilateral legs give out on her, totalAx2 to sit on EOB safely. Returned to supine and repositioned maxAx2. Further treatment deferred due to pt fatigue, pain, and arrival of lunch. Pt set up with all needs in reach. The patient would benefit from further skilled PT intervention to continue to progress towards goals. Recommendation remains appropriate.     Follow Up Recommendations  SNF     Equipment Recommendations  Other (comment) (defer to next level of care)    Recommendations for Other Services       Precautions / Restrictions Precautions Precautions: Fall;Posterior Hip Precaution Booklet Issued: No Restrictions Weight Bearing Restrictions: Yes RLE Weight Bearing: Weight bearing as tolerated    Mobility  Bed Mobility Overal bed mobility: Needs Assistance Bed Mobility: Sit to Supine     Supine to sit: Max assist;+2 for safety/equipment        Transfers Overall transfer level: Needs assistance Equipment used: Rolling walker (2 wheeled) Transfers: Sit to/from [facility] Sit to Stand: From elevated surface;+2 safety/equipment;Max assist Stand pivot transfers: Max assist;+2 physical  assistance;Total assist       General transfer comment: Halfway through stand pivot transfer, pt legs gave out, unable to assist, totalAx2 to return EOB safely  Ambulation/Gait             General Gait Details: unable/unsafe/unwilling to trial   Stairs             Wheelchair Mobility    Modified Rankin (Stroke Patients Only)       Balance Overall balance assessment: Needs assistance Sitting-balance support: Feet supported Sitting balance-Leahy Scale: Poor Sitting balance - Comments: reliant on UE support this PM   Standing balance support: Bilateral upper extremity supported Standing balance-Leahy Scale: Zero Standing balance comment: reliant on walker, significant difficulty clearing feet from the floor                            Cognition Arousal/Alertness: Awake/alert Behavior During Therapy: WFL for tasks assessed/performed Overall Cognitive Status: Within Functional Limits for tasks assessed                                 General Comments: Pt more fatigued this PM session, had just finished using bed pan in chair      Exercises Total Joint Exercises Ankle Circles/Pumps: Strengthening;10 reps;Both Quad Sets: Strengthening;10 reps;Both Gluteal Sets: Strengthening;10 reps;Both Heel Slides: AAROM;10 reps;Right Hip ABduction/ADduction: 10 reps;AROM;AAROM;Right Other Exercises Other Exercises: Patient required AAROM on RLE for increased ROM. She does have difficulty with ROM on RLE due to weakness and pain;    General Comments  General comments (skin integrity, edema, etc.): BP assessed in supine and in sittin no complaints of lightheadedness/dizziness      Pertinent Vitals/Pain Pain Assessment: Faces Faces Pain Scale: Hurts whole lot Pain Location: right hip with mobility Pain Descriptors / Indicators: Operative site guarding;Sore;Guarding Pain Intervention(s): Monitored during session;Repositioned;Patient requesting pain  meds-RN notified    Home Living                      Prior Function            PT Goals (current goals can now be found in the care plan section) Progress towards PT goals:  (slowly)    Frequency    BID      PT Plan Current plan remains appropriate    Co-evaluation              AM-PAC PT "6 Clicks" Mobility   Outcome Measure  Help needed turning from your back to your side while in a flat bed without using bedrails?: A Lot Help needed moving from lying on your back to sitting on the side of a flat bed without using bedrails?: A Lot Help needed moving to and from a bed to a chair (including a wheelchair)?: A Lot Help needed standing up from a chair using your arms (e.g., wheelchair or bedside chair)?: A Lot Help needed to walk in hospital room?: Total Help needed climbing 3-5 steps with a railing? : Total 6 Click Score: 10    End of Session Equipment Utilized During Treatment: Oxygen;Gait belt Activity Tolerance: Patient limited by pain;Patient limited by fatigue Patient left: in bed;with call bell/phone within reach;with bed alarm set Nurse Communication: Mobility status PT Visit Diagnosis: Muscle weakness (generalized) (M62.81);Difficulty in walking, not elsewhere classified (R26.2);Pain Pain - Right/Left: Right Pain - part of body: Hip     Time: 1165-7903 PT Time Calculation (min) (ACUTE ONLY): 13 min  Charges:  $Therapeutic Exercise: 23-37 mins $Therapeutic Activity: 8-22 mins                     Lieutenant Diego PT, DPT 1:14 PM,04/03/20

## 2020-04-03 NOTE — Care Management Important Message (Signed)
Important Message  Patient Details  Name: Heather Peterson MRN: [id number] Date of Birth: 10-29-1940   Medicare Important Message Given:  Yes     Dannette Barbara 04/03/2020, 11:23 AM

## 2020-04-03 NOTE — Progress Notes (Incomplete)
PHARMACIST - PHYSICIAN COMMUNICATION  CONCERNING:  Enoxaparin (Lovenox) for DVT Prophylaxis    RECOMMENDATION: Patient was prescribed enoxaprin 40mg  q24 hours for VTE prophylaxis.   Filed Weights   03/31/20 0611 04/01/20 0611 04/02/20 0700  Weight: 84.9 kg (187 lb 3.2 oz) 80.6 kg (177 lb 11.1 oz) 86.2 kg (190 lb 0.6 oz)    Body mass index is 32.62 kg/m.  Estimated Creatinine Clearance: 36.8 mL/min (A) (by C-G formula based on SCr of 1.34 mg/dL (H)).   Based on [place] patient is candidate for enoxaparin 0.5mg /kg TBW SQ every 24 hours based on BMI being >30.  Patient is candidate for enoxaparin 30mg  every 24 hours based on CrCl <28ml/min or Weight <45kg  DESCRIPTION: Pharmacy has adjusted enoxaparin dose per [facility] policy.  Patient is now receiving enoxaparin *** mg every *** hours    Rowland Lathe, PharmD Clinical Pharmacist  04/03/2020 11:12 AM

## 2020-04-04 LAB — TYPE AND SCREEN
ABO/RH(D): B POS
Antibody Screen: NEGATIVE
Unit division: 0

## 2020-04-04 LAB — BPAM RBC
Blood Product Expiration Date: 202112162359
ISSUE DATE / TIME: 202111221332
Unit Type and Rh: 7300

## 2020-04-05 ENCOUNTER — Telehealth: Payer: Self-pay | Admitting: Oncology

## 2020-04-05 NOTE — Telephone Encounter (Signed)
All of the patients appt were canceled. She is currently admitted to a Rehab facility due to breaking her hip. Will follow-up with the patient in 2 weeeks to see if she is ablt to return to clinic. Per Woodfin Ganja pt is not to get Chemo when she returns. He will determine when he sees her.

## 2020-04-05 NOTE — Progress Notes (Deleted)
[place]  [phone] [phone]  ID: Heather Peterson OB: Nov 11, 1940  MR#: [id number]  WGY#:[id number]  [name], MD as PCP - General (Family Medicine) Lloyd Huger, MD as Consulting Physician (Oncology)  CHIEF COMPLAINT: Multiple myeloma.  INTERVAL HISTORY: Patient returns to clinic today for further evaluation and consideration of cycle 1, day 8 of Revlimid and Velcade.  Her husband had a fall and fractured his hip this past week.  She has increased diarrhea over the last 2 to 3 days, but otherwise tolerated her treatment well.  She continues to have persistent weakness and fatigue.  She has chronic shortness of breath and requires oxygen 24 hours/day. She has no neurologic complaints.  She denies any recent fevers.  She has a fair appetite.  She denies any chest pain, cough, or hemoptysis.  She denies any nausea, vomiting, or constipation.  She has no melena or hematochezia.  She has no urinary complaints.  Patient offers no further specific complaints today.  REVIEW OF SYSTEMS:   Review of Systems  Constitutional: Positive for malaise/fatigue. Negative for fever.  Respiratory: Positive for shortness of breath. Negative for cough and hemoptysis.   Cardiovascular: Positive for leg swelling. Negative for chest pain.  Gastrointestinal: Positive for diarrhea. Negative for abdominal pain, melena and nausea.  Genitourinary: Negative.  Negative for dysuria and hematuria.  Musculoskeletal: Negative.  Negative for back pain.  Skin: Negative.  Negative for rash.  Neurological: Positive for weakness. Negative for dizziness, focal weakness and headaches.  Psychiatric/Behavioral: Negative.  The patient is not nervous/anxious.     As per HPI. Otherwise, a complete review of systems is negative.  PAST MEDICAL HISTORY: Past Medical History:  Diagnosis Date  . Anxiety   . Chronic combined systolic and diastolic CHF  (congestive heart failure) ([place])   . Chronic kidney disease   . Coronary artery disease   . Depression   . Diabetes mellitus without complication ([place])   . Diabetes mellitus, type II ([place])   . Hypertension   . MI (myocardial infarction) ([place])    x 5  . Pacemaker   . Prolonged Q-T interval on ECG   . Thyroid disease     PAST SURGICAL HISTORY: Past Surgical History:  Procedure Laterality Date  . CENTRAL LINE INSERTION  03/11/2017   Procedure: CENTRAL LINE INSERTION;  Surgeon: Leonie Man, MD;  Location: [facility];  Service: Cardiovascular;;  . CHOLECYSTECTOMY    . COLONOSCOPY WITH PROPOFOL N/A 09/01/2019   Procedure: COLONOSCOPY WITH PROPOFOL;  Surgeon: Toledo, Benay Pike, MD;  Location: [facility];  Service: Gastroenterology;  Laterality: N/A;  . CORONARY STENT INTERVENTION W/IMPELLA N/A 03/11/2017   Procedure: Coronary Stent Intervention w/Impella;  Surgeon: Leonie Man, MD;  Location: [place] [facility];  Service: Cardiovascular;  Laterality: N/A;  . ESOPHAGOGASTRODUODENOSCOPY (EGD) WITH PROPOFOL N/A 09/01/2019   Procedure: ESOPHAGOGASTRODUODENOSCOPY (EGD) WITH PROPOFOL;  Surgeon: Toledo, Benay Pike, MD;  Location: [facility];  Service: Gastroenterology;  Laterality: N/A;  . ESOPHAGOGASTRODUODENOSCOPY (EGD) WITH PROPOFOL N/A 09/08/2019   Procedure: ESOPHAGOGASTRODUODENOSCOPY (EGD) WITH PROPOFOL;  Surgeon: [name], MD;  Location: [facility]OSCOPY;  Service: Gastroenterology;  Laterality: N/A;  . EYE SURGERY    . HIP ARTHROPLASTY Right 03/29/2020   Procedure: ARTHROPLASTY BIPOLAR HIP (HEMIARTHROPLASTY);  Surgeon: Corky Mull, MD;  Location: [facility] OR[facility];  Service: Orthopedics;  Laterality: Right;  . INTRAVASCULAR PRESSURE WIRE/FFR STUDY N/A 03/11/2017   Procedure: INTRAVASCULAR PRESSURE WIRE/FFR STUDY;  Surgeon: Leonie Man, MD;  Location: [place] [facility];  Service: Cardiovascular;  Laterality: N/A;  . INTRAVASCULAR ULTRASOUND/IVUS N/A 03/11/2017    Procedure: Intravascular Ultrasound/IVUS;  Surgeon: Leonie Man, MD;  Location: [place] [facility];  Service: Cardiovascular;  Laterality: N/A;  . LEFT HEART CATH AND CORONARY ANGIOGRAPHY N/A 03/05/2017   Procedure: LEFT HEART CATH AND CORONARY ANGIOGRAPHY;  Surgeon: Isaias Cowman, MD;  Location: [place];  Service: Cardiovascular;  Laterality: N/A;  . PACEMAKER IMPLANT    . pacemaker/defibrillator Left     FAMILY HISTORY: Family History  Problem Relation Age of Onset  . Hypertension Father   . Heart attack Father   . Depression Sister   . Depression Brother   . Depression Brother     ADVANCED DIRECTIVES (Y/N):  N  HEALTH MAINTENANCE: Social History   Tobacco Use  . Smoking status: Current Every Day Smoker    Packs/day: 0.25    Types: E-cigarettes, Cigarettes  . Smokeless tobacco: Never Used  Vaping Use  . Vaping Use: Former  Substance Use Topics  . Alcohol use: Not Currently    Comment: occasionally  . Drug use: No     Colonoscopy:  PAP:  Bone density:  Lipid panel:  Allergies  Allergen Reactions  . Celebrex [Celecoxib] Anaphylaxis  . Glipizide Anaphylaxis  . Levaquin [Levofloxacin In D5w] Other (See Comments)    Heart arrhthymias  . Levofloxacin Other (See Comments) and Palpitations    ICD fired  . Lisinopril Swelling    Lip and facial swelling  . Sulfa Antibiotics Other (See Comments) and Anaphylaxis    Reaction: unknown  . Metformin Other (See Comments)    Gi tolerance   . Penicillins Rash and Other (See Comments)    Has patient had a PCN reaction causing immediate rash, facial/tongue/throat swelling, SOB or lightheadedness with hypotension: Unknown Has patient had a PCN reaction causing severe rash involving mucus membranes or skin necrosis: No Has patient had a PCN reaction that required hospitalization: No Has patient had a PCN reaction occurring within the last 10 years: No If all of the above answers are "NO", then may  proceed with Cephalosporin use.     Current Outpatient Medications  Medication Sig Dispense Refill  . allopurinol (ZYLOPRIM) 100 MG tablet Take 50 mg by mouth daily.    Marland Kitchen aspirin EC 81 MG tablet Take 81 mg by mouth at bedtime.     . chlorpheniramine-HYDROcodone (TUSSIONEX) 10-8 MG/5ML SUER Take 5 mLs by mouth every 12 (twelve) hours as needed for cough. 140 mL 0  . colchicine 0.6 MG tablet Take 0.6 mg by mouth 2 (two) times daily.    Marland Kitchen dexamethasone (DECADRON) 4 MG tablet Take 5 tablets (20 mg total) by mouth daily. Take the day after Velcade on days 2,5,9,12. Take with breakfast 40 tablet 3  . docusate sodium (COLACE) 100 MG capsule Take 1 capsule (100 mg total) by mouth 2 (two) times daily. 10 capsule 0  . enoxaparin (LOVENOX) 30 MG/0.3ML injection Inject 0.3 mLs (30 mg total) into the skin daily for 14 days. For 14 days.    Marland Kitchen enoxaparin (LOVENOX) 40 MG/0.4ML injection Inject 0.4 mLs (40 mg total) into the skin daily. 5.6 mL 0  . Ensure Max Protein (ENSURE MAX PROTEIN) LIQD Take 330 mLs (11 oz total) by mouth 2 (two) times daily between meals.    . ferrous sulfate 325 (65 FE) MG tablet Take 1 tablet (325 mg total) by mouth  2 (two) times daily with a meal.  3  . fexofenadine (ALLEGRA) 180 MG tablet Take 180 mg by mouth daily.    Marland Kitchen FLUoxetine (PROZAC) 10 MG capsule Take 10 mg by mouth daily.    . folic acid (FOLVITE) 1 MG tablet folic acid 1 mg tablet    . HYDROcodone-acetaminophen (NORCO/VICODIN) 5-325 MG tablet Take 1-2 tablets by mouth every 4 (four) hours as needed for moderate pain. 30 tablet 0  . hydrocortisone (ANUSOL-HC) 2.5 % rectal cream Apply 1 application topically 2 (two) times daily.    Marland Kitchen LANTUS SOLOSTAR 100 UNIT/ML Solostar Pen Inject 16 Units into the skin at bedtime.    Marland Kitchen lenalidomide (REVLIMID) 10 MG capsule Take 1 capsule (10 mg total) by mouth daily. Take 14 days on, 7 days off, repeat every 21 days 14 capsule 0  . levothyroxine (SYNTHROID) 50 MCG tablet Take 1 tablet (50  mcg total) by mouth daily before breakfast. On Tuesday, Thursday, Saturday and Sunday    . levothyroxine (SYNTHROID) 75 MCG tablet Take 1 tablet (75 mcg total) by mouth daily before breakfast. On Monday, Wednesday and Friday    . lidocaine (LIDODERM) 5 % Place 1 patch onto the skin daily. Remove & Discard patch within 12 hours or as directed by MD 30 patch 0  . magnesium oxide (MAG-OX) 400 MG tablet Take 200 mg by mouth daily.    Marland Kitchen mexiletine (MEXITIL) 200 MG capsule Take 1 capsule (200 mg total) by mouth every 12 (twelve) hours. 60 capsule 1  . midodrine (PROAMATINE) 10 MG tablet Take 1 tablet (10 mg total) by mouth 2 (two) times daily with a meal.    . nystatin cream (MYCOSTATIN) Apply 1 application topically 2 (two) times daily.    . ondansetron (ZOFRAN) 8 MG tablet Take 1 tablet (8 mg total) by mouth 2 (two) times daily as needed (Nausea or vomiting). 60 tablet 2  . OXYGEN Inhale 2 L into the lungs continuous.    . pantoprazole (PROTONIX) 40 MG tablet Take 40 mg by mouth daily.     . polyethylene glycol (MIRALAX / GLYCOLAX) 17 g packet Take 17 g by mouth daily. 14 each 0  . potassium chloride SA (KLOR-CON) 20 MEQ tablet Take 20 mEq by mouth daily.     . Prenatal Vit-Fe Fumarate-FA (PRENATAL MULTIVITAMIN) TABS tablet Take 1 tablet by mouth daily.    . prochlorperazine (COMPAZINE) 10 MG tablet Take 1 tablet (10 mg total) by mouth every 6 (six) hours as needed (Nausea or vomiting). 60 tablet 2  . simvastatin (ZOCOR) 40 MG tablet Take 20 mg by mouth at bedtime.   2  . spironolactone (ALDACTONE) 25 MG tablet Take 25 mg by mouth daily.     . sucralfate (CARAFATE) 1 g tablet Take 1 tablet by mouth 3 (three) times daily before meals.    . torsemide (DEMADEX) 20 MG tablet Take 2 tablets (40 mg total) by mouth daily. Future refills need to come from kidney doctor 60 tablet 1  . traMADol (ULTRAM) 50 MG tablet Take 1 tablet (50 mg total) by mouth every 6 (six) hours as needed for moderate pain. 30 tablet 0   . traZODone (DESYREL) 50 MG tablet Take 50 mg by mouth at bedtime.     No current facility-administered medications for this visit.    OBJECTIVE: There were no vitals filed for this visit.   There is no height or weight on file to calculate BMI.    ECOG FS:2 -  Symptomatic, <50% confined to bed  General: Well-developed, well-nourished, no acute distress.  Sitting in a wheelchair. Eyes: Pink conjunctiva, anicteric sclera. HEENT: Normocephalic, moist mucous membranes. Lungs: No audible wheezing or coughing. Heart: Regular rate and rhythm. Abdomen: Soft, nontender, no obvious distention. Musculoskeletal: No edema, cyanosis, or clubbing. Neuro: Alert, answering all questions appropriately. Cranial nerves grossly intact. Skin: No rashes or petechiae noted. Psych: Normal affect.  LAB RESULTS:  Lab Results  Component Value Date   NA 133 (L) 04/03/2020   K 4.3 04/03/2020   CL 96 (L) 04/03/2020   CO2 28 04/03/2020   GLUCOSE 105 (H) 04/03/2020   BUN 34 (H) 04/03/2020   CREATININE 1.34 (H) 04/03/2020   CALCIUM 7.6 (L) 04/03/2020   PROT 5.6 (L) 03/28/2020   ALBUMIN 3.3 (L) 03/28/2020   AST 22 03/28/2020   ALT 25 03/28/2020   ALKPHOS 67 03/28/2020   BILITOT 0.8 03/28/2020   GFRNONAA 41 (L) 04/03/2020   GFRAA 30 (L) 09/09/2019    Lab Results  Component Value Date   WBC 5.8 04/03/2020   NEUTROABS 3.9 03/28/2020   HGB 8.0 (L) 04/03/2020   HCT 24.8 (L) 04/03/2020   MCV 97.3 04/03/2020   PLT 207 04/03/2020   Lab Results  Component Value Date   IRON 18 (L) 03/31/2020   TIBC 304 03/31/2020   IRONPCTSAT 6 (L) 03/31/2020   Lab Results  Component Value Date   FERRITIN 379 (H) 03/31/2020     STUDIES: DG Chest 1 View  Result Date: 03/28/2020 CLINICAL DATA:  Fall.  Landed on her RIGHT side.  RIGHT hip pain. EXAM: CHEST  1 VIEW COMPARISON:  03/15/2020 FINDINGS: Heart is enlarged, stable in configuration. LEFT-sided AICD is unchanged. No focal consolidations or pleural  effusions. No pulmonary edema. Atherosclerotic calcification of the thoracic aorta. IMPRESSION: Stable cardiomegaly. Electronically Signed   By: Nolon Nations M.D.   On: 03/28/2020 15:38   DG Chest 2 View  Result Date: 03/15/2020 CLINICAL DATA:  79 year old female with cough for 2-3 months and drainage. Multiple myeloma. EXAM: CHEST - 2 VIEW COMPARISON:  Chest radiographs 08/27/2019 and earlier. FINDINGS: Chronic cardiomegaly, left chest AICD. Improved lung volumes, and resolved small pleural effusions since April. No pneumothorax. Stable pulmonary vascularity, no overt edema. No confluent pulmonary opacity. Calcified aortic atherosclerosis. Stable visualized osseous structures. Negative visible bowel gas pattern. IMPRESSION: Cardiomegaly. Resolved pleural effusions since April. No acute cardiopulmonary abnormality. Electronically Signed   By: Genevie Ann M.D.   On: 03/15/2020 12:14   DG Thoracic Spine 2 View  Result Date: 03/31/2020 CLINICAL DATA:  Generalized back pain for few days. RIGHT hip arthroplasty on 03/29/2020 EXAM: THORACIC SPINE 2 VIEWS COMPARISON:  Previous CT imaging is available for comparison. FINDINGS: Pacer defibrillator leads project over the central chest. Osteopenia. No sign of acute fracture or static malalignment of the thoracic spine. Signs of coronary artery disease and prior coronary artery stenting. Vascular calcifications in the thoracic and abdominal aorta. IMPRESSION: 1. No signs of acute fracture or static malalignment. 2. Signs of vascular disease, coronary artery disease and coronary artery stenting. Electronically Signed   By: Zetta Bills M.D.   On: 03/31/2020 08:13   DG Lumbar Spine 2-3 Views  Result Date: 03/31/2020 CLINICAL DATA:  Generalized back pain for few days. RIGHT hip arthroplasty on 03/29/2020. Back pain since surgery. EXAM: LUMBAR SPINE - 2-3 VIEW COMPARISON:  CT of the abdomen and pelvis from March of 2021 in metastatic bone survey from October of 2021  FINDINGS: Lumbar spine with degenerative changes. Six lumbar type vertebral bodies. Osteopenia. Vertebral body heights are maintained with disc space narrowing at multiple levels greatest at L1-L2 and L2-L3. Mild anterolisthesis of the fourth on the fifth lumbar vertebral body approximately 2 mm to 3 mm is similar to previous imaging with mild retrolisthesis of the fifth on the sixth lumbar type vertebral body. Disc space narrowing also pronounced at the fifth and sixth lumbar level and at the lumbosacral transition. Facet arthropathy is demonstrated. There may be new anterior translation, anterolisthesis of the sixth lumbar vertebral level on the first sacral type vertebral body approximately 2-3 mm since the previous exam. No sign of acute fracture in the lumbar spine. Incidental note is made of cholecystectomy clips in the RIGHT upper quadrant and renal calculus projecting over the LEFT renal contour approximately 5 mm seen also on the prior CT study from March of 2021 IMPRESSION: Multilevel degenerative changes greatest in lower lumbar spine with potential new mild anterolisthesis of the lowest lumbar vertebral elements on the first sacral vertebral body no signs of fracture. Correlate with radicular symptoms and cross-sectional imaging as warranted. LEFT renal calculus. Electronically Signed   By: Zetta Bills M.D.   On: 03/31/2020 08:23   CT Head Wo Contrast  Result Date: 03/28/2020 CLINICAL DATA:  Trauma. EXAM: CT HEAD WITHOUT CONTRAST CT CERVICAL SPINE WITHOUT CONTRAST TECHNIQUE: Multidetector CT imaging of the head and cervical spine was performed following the standard protocol without intravenous contrast. Multiplanar CT image reconstructions of the cervical spine were also generated. COMPARISON:  None. FINDINGS: CT HEAD FINDINGS Brain: No evidence of acute large vascular territory infarction, hemorrhage, hydrocephalus, extra-axial collection or mass lesion/mass effect. Remote right frontal infarct  with encephalomalacia. Patchy white matter hypoattenuation, compatible with chronic microvascular ischemic disease. Generalized cerebral atrophy. Vascular: Calcific atherosclerosis. Skull: No acute fracture Sinuses/Orbits: Visualized sinuses are clear. Unremarkable visualized orbits. Other: No mastoid effusions. CT CERVICAL SPINE FINDINGS Alignment: Mild reversal of the normal cervical lordosis, likely positional. Mild anterolisthesis of C3 on C4, likely degenerative given facet arthropathy at this level. Otherwise, no substantial subluxation. Skull base and vertebrae: No acute fracture. Vertebral body heights are maintained. Soft tissues and spinal canal: No prevertebral fluid or swelling. No visible canal hematoma. Disc levels: Mild multilevel degenerative disc disease. Multilevel facet arthropathy, greatest at C3-C4. Upper chest: Emphysema. Other: Calcific atherosclerosis of the carotids. IMPRESSION: CT head: 1. No evidence of acute intracranial abnormality. 2. Remote right frontal infarct with encephalomalacia. 3. Chronic microvascular ischemic disease. CT cervical spine: No evidence of acute fracture or traumatic malalignment. Electronically Signed   By: Margaretha Sheffield MD   On: 03/28/2020 15:45   CT Cervical Spine Wo Contrast  Result Date: 03/28/2020 CLINICAL DATA:  Trauma. EXAM: CT HEAD WITHOUT CONTRAST CT CERVICAL SPINE WITHOUT CONTRAST TECHNIQUE: Multidetector CT imaging of the head and cervical spine was performed following the standard protocol without intravenous contrast. Multiplanar CT image reconstructions of the cervical spine were also generated. COMPARISON:  None. FINDINGS: CT HEAD FINDINGS Brain: No evidence of acute large vascular territory infarction, hemorrhage, hydrocephalus, extra-axial collection or mass lesion/mass effect. Remote right frontal infarct with encephalomalacia. Patchy white matter hypoattenuation, compatible with chronic microvascular ischemic disease. Generalized  cerebral atrophy. Vascular: Calcific atherosclerosis. Skull: No acute fracture Sinuses/Orbits: Visualized sinuses are clear. Unremarkable visualized orbits. Other: No mastoid effusions. CT CERVICAL SPINE FINDINGS Alignment: Mild reversal of the normal cervical lordosis, likely positional. Mild anterolisthesis of C3 on C4, likely degenerative given facet arthropathy at  this level. Otherwise, no substantial subluxation. Skull base and vertebrae: No acute fracture. Vertebral body heights are maintained. Soft tissues and spinal canal: No prevertebral fluid or swelling. No visible canal hematoma. Disc levels: Mild multilevel degenerative disc disease. Multilevel facet arthropathy, greatest at C3-C4. Upper chest: Emphysema. Other: Calcific atherosclerosis of the carotids. IMPRESSION: CT head: 1. No evidence of acute intracranial abnormality. 2. Remote right frontal infarct with encephalomalacia. 3. Chronic microvascular ischemic disease. CT cervical spine: No evidence of acute fracture or traumatic malalignment. Electronically Signed   By: Margaretha Sheffield MD   On: 03/28/2020 15:45   US Abdomen Complete  Result Date: 03/11/2020 CLINICAL DATA:  Cirrhosis EXAM: ABDOMEN ULTRASOUND COMPLETE COMPARISON:  CT 07/16/2019 FINDINGS: Gallbladder: Prior cholecystectomy Common bile duct: Diameter: Normal caliber, 3 mm Liver: Diffusely increased echotexture and nodular contours. No focal hepatic abnormality. Portal vein is patent on color Doppler imaging with normal direction of blood flow towards the liver. IVC: No abnormality visualized. Pancreas: Visualized portion unremarkable. Spleen: Size and appearance within normal limits. Right Kidney: Length: 8.4 cm. Echogenicity within normal limits. No mass or hydronephrosis visualized. Left Kidney: Length: 8.8 cm. Echogenicity within normal limits. No mass or hydronephrosis visualized. Abdominal aorta: No aneurysm visualized. Other findings: Moderate ascites. IMPRESSION: Changes of  hepatic steatosis and cirrhosis. Moderate ascites. No focal hepatic abnormality. Electronically Signed   By: Rolm Baptise M.D.   On: 03/11/2020 20:44   DG HIP UNILAT W OR W/O PELVIS 2-3 VIEWS RIGHT  Result Date: 03/29/2020 CLINICAL DATA:  Postop from right hip arthroplasty. EXAM: DG HIP (WITH OR WITHOUT PELVIS) 2-3V RIGHT COMPARISON:  None. FINDINGS: A right hip prosthesis is seen in expected position. No evidence of fracture or dislocation. IMPRESSION: Expected postoperative appearance of right hip prosthesis. Electronically Signed   By: Marlaine Hind M.D.   On: 03/29/2020 20:20   DG Hip Unilat W or Wo Pelvis 2-3 Views Right  Result Date: 03/28/2020 CLINICAL DATA:  Fall EXAM: DG HIP (WITH OR WITHOUT PELVIS) 2-3V RIGHT COMPARISON:  None. FINDINGS: There is a comminuted impacted minimally displaced fracture of the right transcervical femoral neck involving the greater trochanter. The femoral head is still well seated within the acetabulum. Overlying soft tissue swelling is noted. IMPRESSION: Comminuted mildly impacted right transcervical femoral neck fracture involving the greater trochanter. Electronically Signed   By: Prudencio Pair M.D.   On: 03/28/2020 15:38    ASSESSMENT: Multiple myeloma.  PLAN:    1.  Multiple myeloma: Bone marrow biopsy confirmed diagnosis with plasma cells up to 90% of biopsy.  Cytogenetics are reported as normal.  Metastatic bone survey from March 03, 2020 reviewed independently and report as above with multiple skeletal lucencies consistent with myeloma.  M spike is only mildly elevated at 1.2, but she has a significantly elevated IgM level of 2773.  Kappa and lambda free light chains are within normal limits.  Patient noted to have endorgan damage of renal insufficiency as well as persistent anemia.  She will benefit from Velcade on days 1, 4, 8, and 11 along with 10 mg Revlimid on days 1 through 14.  Revlimid has been dose reduced secondary to renal insufficiency.   Patient will also receive 20 mg dexamethasone 1 day after each Velcade injection.  Proceed with cycle 1, day 8 of treatment today.  Return to clinic on Thursday for Velcade only.  Patient will then return to clinic in 3 weeks instead of 2 weeks because of the Thanksgiving holiday for consideration of cycle  2, day 1.  2.  Anemia: Hemoglobin improved to 9.0.  Likely multifactorial with chronic renal insufficiency, underlying myeloma, as well as history of recent GI bleed.  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 She does not require transfusion or IV iron at this time, but this may be necessary in the future.   3.  Chronic renal insufficiency: Creatinine improved to 1.63.  Dose reduced Revlimid as above.  Velcade does not need to be dosed reduced and renal dysfunction.  Monitor.   4.  Ascites: Continue follow-up with heart failure clinic as scheduled. 5.  Angiodysplastic lesions: Continue follow-up with GI as scheduled. 6.  Diarrhea: Patient was given a prescription for Lomotil today.   Patient expressed understanding and was in agreement with this plan. She also understands that She can call clinic at any time with any questions, concerns, or complaints.    Lloyd Huger, MD   04/05/2020 10:31 AM

## 2020-04-10 ENCOUNTER — Telehealth: Payer: Self-pay | Admitting: *Deleted

## 2020-04-10 ENCOUNTER — Inpatient Hospital Stay: Payer: Medicare Other | Admitting: Oncology

## 2020-04-10 ENCOUNTER — Inpatient Hospital Stay: Payer: Medicare Other

## 2020-04-10 NOTE — Telephone Encounter (Signed)
Can she come in to clinic for labs and evaluation?  If not we can fax an order.

## 2020-04-10 NOTE — Telephone Encounter (Signed)
Per daughter, yes she can be transported, but said that patient cannot stand or sit in wheelchair for a long period of time. Brooke, Please call and schedule patient appts

## 2020-04-10 NOTE — Telephone Encounter (Signed)
Daughter called reporting that patient has been discharged from hospital and sent to [facility] and is asking who is going to check her labs and also has concerns over her medications as [facility] was giving her Prednisone daily for 4 days before they realized it. Please return her call [phone]

## 2020-04-13 ENCOUNTER — Encounter: Payer: Self-pay | Admitting: Oncology

## 2020-04-13 ENCOUNTER — Other Ambulatory Visit: Payer: Self-pay

## 2020-04-13 ENCOUNTER — Inpatient Hospital Stay (HOSPITAL_BASED_OUTPATIENT_CLINIC_OR_DEPARTMENT_OTHER): Payer: Medicare Other | Admitting: Oncology

## 2020-04-13 ENCOUNTER — Inpatient Hospital Stay: Payer: Medicare Other | Attending: Oncology

## 2020-04-13 ENCOUNTER — Ambulatory Visit: Payer: Medicare Other

## 2020-04-13 VITALS — BP 115/60 | HR 77 | Temp 98.0°F | Resp 20 | Wt 174.0 lb

## 2020-04-13 DIAGNOSIS — Z79899 Other long term (current) drug therapy: Secondary | ICD-10-CM | POA: Diagnosis not present

## 2020-04-13 DIAGNOSIS — I130 Hypertensive heart and chronic kidney disease with heart failure and stage 1 through stage 4 chronic kidney disease, or unspecified chronic kidney disease: Secondary | ICD-10-CM | POA: Insufficient documentation

## 2020-04-13 DIAGNOSIS — E1122 Type 2 diabetes mellitus with diabetic chronic kidney disease: Secondary | ICD-10-CM | POA: Insufficient documentation

## 2020-04-13 DIAGNOSIS — C9000 Multiple myeloma not having achieved remission: Secondary | ICD-10-CM | POA: Diagnosis not present

## 2020-04-13 DIAGNOSIS — N189 Chronic kidney disease, unspecified: Secondary | ICD-10-CM | POA: Diagnosis not present

## 2020-04-13 DIAGNOSIS — D649 Anemia, unspecified: Secondary | ICD-10-CM | POA: Insufficient documentation

## 2020-04-13 DIAGNOSIS — R188 Other ascites: Secondary | ICD-10-CM | POA: Diagnosis not present

## 2020-04-13 LAB — CBC WITH DIFFERENTIAL/PLATELET
Abs Immature Granulocytes: 0.09 10*3/uL — ABNORMAL HIGH (ref 0.00–0.07)
Basophils Absolute: 0.1 10*3/uL (ref 0.0–0.1)
Basophils Relative: 1 %
Eosinophils Absolute: 0.1 10*3/uL (ref 0.0–0.5)
Eosinophils Relative: 1 %
HCT: 32.5 % — ABNORMAL LOW (ref 36.0–46.0)
Hemoglobin: 10.3 g/dL — ABNORMAL LOW (ref 12.0–15.0)
Immature Granulocytes: 1 %
Lymphocytes Relative: 9 %
Lymphs Abs: 0.9 10*3/uL (ref 0.7–4.0)
MCH: 30.2 pg (ref 26.0–34.0)
MCHC: 31.7 g/dL (ref 30.0–36.0)
MCV: 95.3 fL (ref 80.0–100.0)
Monocytes Absolute: 1 10*3/uL (ref 0.1–1.0)
Monocytes Relative: 10 %
Neutro Abs: 7.4 10*3/uL (ref 1.7–7.7)
Neutrophils Relative %: 78 %
Platelets: 281 10*3/uL (ref 150–400)
RBC: 3.41 MIL/uL — ABNORMAL LOW (ref 3.87–5.11)
RDW: 18.3 % — ABNORMAL HIGH (ref 11.5–15.5)
WBC: 9.6 10*3/uL (ref 4.0–10.5)
nRBC: 0 % (ref 0.0–0.2)

## 2020-04-13 LAB — IRON AND TIBC
Iron: 88 ug/dL (ref 28–170)
Saturation Ratios: 21 % (ref 10.4–31.8)
TIBC: 424 ug/dL (ref 250–450)
UIBC: 336 ug/dL

## 2020-04-13 LAB — COMPREHENSIVE METABOLIC PANEL
ALT: 49 U/L — ABNORMAL HIGH (ref 0–44)
AST: 37 U/L (ref 15–41)
Albumin: 3.7 g/dL (ref 3.5–5.0)
Alkaline Phosphatase: 110 U/L (ref 38–126)
Anion gap: 12 (ref 5–15)
BUN: 29 mg/dL — ABNORMAL HIGH (ref 8–23)
CO2: 33 mmol/L — ABNORMAL HIGH (ref 22–32)
Calcium: 9 mg/dL (ref 8.9–10.3)
Chloride: 93 mmol/L — ABNORMAL LOW (ref 98–111)
Creatinine, Ser: 1.23 mg/dL — ABNORMAL HIGH (ref 0.44–1.00)
GFR, Estimated: 45 mL/min — ABNORMAL LOW (ref >60)
Glucose, Bld: 114 mg/dL — ABNORMAL HIGH (ref 70–99)
Potassium: 4.3 mmol/L (ref 3.5–5.1)
Sodium: 138 mmol/L (ref 135–145)
Total Bilirubin: 0.6 mg/dL (ref 0.3–1.2)
Total Protein: 6.7 g/dL (ref 6.5–8.1)

## 2020-04-13 LAB — FERRITIN: Ferritin: 102 ng/mL (ref 11–307)

## 2020-04-14 NOTE — Progress Notes (Signed)
[place]  [phone] [phone]  ID: Heather Peterson OB: 07/29/1940  MR#: [id number]  QHU#:[id number]  [name], MD as PCP - General (Family Medicine) Lloyd Huger, MD as Consulting Physician (Oncology)  CHIEF COMPLAINT: Multiple myeloma.  INTERVAL HISTORY: Patient returns to clinic today for hospital follow-up and further evaluation.  She is recently admitted and had surgery for a fractured hip and is now in rehab.  She continues to have pain and decreased mobility, but admits this has improved over the past week.  She continues to have persistent weakness and fatigue.  She has chronic shortness of breath and requires oxygen 24 hours/day. She has no neurologic complaints.  She denies any recent fevers.  She has a fair appetite.  She denies any chest pain, cough, or hemoptysis.  She denies any nausea, vomiting, or constipation.  She has no melena or hematochezia.  She has no urinary complaints.  Patient offers no further specific complaints today.  REVIEW OF SYSTEMS:   Review of Systems  Constitutional: Positive for malaise/fatigue. Negative for fever.  Respiratory: Positive for shortness of breath. Negative for cough and hemoptysis.   Cardiovascular: Negative.  Negative for chest pain and leg swelling.  Gastrointestinal: Negative.  Negative for abdominal pain, diarrhea, melena and nausea.  Genitourinary: Negative.  Negative for dysuria and hematuria.  Musculoskeletal: Positive for joint pain. Negative for back pain.  Skin: Negative.  Negative for rash.  Neurological: Positive for weakness. Negative for dizziness, focal weakness and headaches.  Psychiatric/Behavioral: Negative.  The patient is not nervous/anxious.     As per HPI. Otherwise, a complete review of systems is negative.  PAST MEDICAL HISTORY: Past Medical History:  Diagnosis Date  . Anxiety   . Chronic combined systolic and diastolic CHF  (congestive heart failure) ([place])   . Chronic kidney disease   . Coronary artery disease   . Depression   . Diabetes mellitus without complication ([place])   . Diabetes mellitus, type II ([place])   . Hypertension   . MI (myocardial infarction) ([place])    x 5  . Pacemaker   . Prolonged Q-T interval on ECG   . Thyroid disease     PAST SURGICAL HISTORY: Past Surgical History:  Procedure Laterality Date  . CENTRAL LINE INSERTION  03/11/2017   Procedure: CENTRAL LINE INSERTION;  Surgeon: Leonie Man, MD;  Location: [place] [facility];  Service: Cardiovascular;;  . CHOLECYSTECTOMY    . COLONOSCOPY WITH PROPOFOL N/A 09/01/2019   Procedure: COLONOSCOPY WITH PROPOFOL;  Surgeon: Toledo, Benay Pike, MD;  Location: [facility] ENDOSCOPY;  Service: Gastroenterology;  Laterality: N/A;  . CORONARY STENT INTERVENTION W/IMPELLA N/A 03/11/2017   Procedure: Coronary Stent Intervention w/Impella;  Surgeon: Leonie Man, MD;  Location: [place] [facility];  Service: Cardiovascular;  Laterality: N/A;  . ESOPHAGOGASTRODUODENOSCOPY (EGD) WITH PROPOFOL N/A 09/01/2019   Procedure: ESOPHAGOGASTRODUODENOSCOPY (EGD) WITH PROPOFOL;  Surgeon: Toledo, Benay Pike, MD;  Location: [facility] ENDOSCOPY;  Service: Gastroenterology;  Laterality: N/A;  . ESOPHAGOGASTRODUODENOSCOPY (EGD) WITH PROPOFOL N/A 09/08/2019   Procedure: ESOPHAGOGASTRODUODENOSCOPY (EGD) WITH PROPOFOL;  Surgeon: [name], MD;  Location: [facility]COPY;  Service: Gastroenterology;  Laterality: N/A;  . EYE SURGERY    . HIP ARTHROPLASTY Right 03/29/2020   Procedure: ARTHROPLASTY BIPOLAR HIP (HEMIARTHROPLASTY);  Surgeon: Corky Mull, MD;  Location: [facility] ORS;  Service: Orthopedics;  Laterality: Right;  . INTRAVASCULAR PRESSURE WIRE/FFR STUDY N/A 03/11/2017   Procedure: INTRAVASCULAR PRESSURE WIRE/FFR STUDY;  Surgeon: Leonie Man, MD;  Location: [place] [facility];  Service: Cardiovascular;  Laterality: N/A;  . INTRAVASCULAR ULTRASOUND/IVUS N/A 03/11/2017    Procedure: Intravascular Ultrasound/IVUS;  Surgeon: Leonie Man, MD;  Location: [place] [facility];  Service: Cardiovascular;  Laterality: N/A;  . LEFT HEART CATH AND CORONARY ANGIOGRAPHY N/A 03/05/2017   Procedure: LEFT HEART CATH AND CORONARY ANGIOGRAPHY;  Surgeon: Isaias Cowman, MD;  Location: [place] [facility];  Service: Cardiovascular;  Laterality: N/A;  . PACEMAKER IMPLANT    . pacemaker/defibrillator Left     FAMILY HISTORY: Family History  Problem Relation Age of Onset  . Hypertension Father   . Heart attack Father   . Depression Sister   . Depression Brother   . Depression Brother     ADVANCED DIRECTIVES (Y/N):  N  HEALTH MAINTENANCE: Social History   Tobacco Use  . Smoking status: Current Every Day Smoker    Packs/day: 0.25    Types: E-cigarettes, Cigarettes  . Smokeless tobacco: Never Used  Vaping Use  . Vaping Use: Former  Substance Use Topics  . Alcohol use: Not Currently    Comment: occasionally  . Drug use: No     Colonoscopy:  PAP:  Bone density:  Lipid panel:  Allergies  Allergen Reactions  . Celebrex [Celecoxib] Anaphylaxis  . Glipizide Anaphylaxis  . Levaquin [Levofloxacin In D5w] Other (See Comments)    Heart arrhthymias  . Levofloxacin Other (See Comments) and Palpitations    ICD fired  . Lisinopril Swelling    Lip and facial swelling  . Sulfa Antibiotics Other (See Comments) and Anaphylaxis    Reaction: unknown  . Metformin Other (See Comments)    Gi tolerance   . Penicillins Rash and Other (See Comments)    Has patient had a PCN reaction causing immediate rash, facial/tongue/throat swelling, SOB or lightheadedness with hypotension: Unknown Has patient had a PCN reaction causing severe rash involving mucus membranes or skin necrosis: No Has patient had a PCN reaction that required hospitalization: No Has patient had a PCN reaction occurring within the last 10 years: No If all of the above answers are "NO", then may  proceed with Cephalosporin use.     Current Outpatient Medications  Medication Sig Dispense Refill  . allopurinol (ZYLOPRIM) 100 MG tablet Take 50 mg by mouth daily.    Marland Kitchen aspirin EC 81 MG tablet Take 81 mg by mouth at bedtime.     . chlorpheniramine-HYDROcodone (TUSSIONEX) 10-8 MG/5ML SUER Take 5 mLs by mouth every 12 (twelve) hours as needed for cough. 140 mL 0  . colchicine 0.6 MG tablet Take 0.6 mg by mouth 2 (two) times daily.    Marland Kitchen dexamethasone (DECADRON) 4 MG tablet Take 5 tablets (20 mg total) by mouth daily. Take the day after Velcade on days 2,5,9,12. Take with breakfast 40 tablet 3  . docusate sodium (COLACE) 100 MG capsule Take 1 capsule (100 mg total) by mouth 2 (two) times daily. 10 capsule 0  . enoxaparin (LOVENOX) 30 MG/0.3ML injection Inject 0.3 mLs (30 mg total) into the skin daily for 14 days. For 14 days.    Marland Kitchen enoxaparin (LOVENOX) 40 MG/0.4ML injection Inject 0.4 mLs (40 mg total) into the skin daily. 5.6 mL 0  . Ensure Max Protein (ENSURE MAX PROTEIN) LIQD Take 330 mLs (11 oz total) by mouth 2 (two) times daily between meals.    . ferrous sulfate 325 (65 FE) MG tablet Take 1 tablet (325 mg total) by mouth  2 (two) times daily with a meal.  3  . fexofenadine (ALLEGRA) 180 MG tablet Take 180 mg by mouth daily.    Marland Kitchen FLUoxetine (PROZAC) 10 MG capsule Take 10 mg by mouth daily.    . folic acid (FOLVITE) 1 MG tablet folic acid 1 mg tablet    . HYDROcodone-acetaminophen (NORCO/VICODIN) 5-325 MG tablet Take 1-2 tablets by mouth every 4 (four) hours as needed for moderate pain. 30 tablet 0  . hydrocortisone (ANUSOL-HC) 2.5 % rectal cream Apply 1 application topically 2 (two) times daily.    Marland Kitchen LANTUS SOLOSTAR 100 UNIT/ML Solostar Pen Inject 16 Units into the skin at bedtime.    Marland Kitchen lenalidomide (REVLIMID) 10 MG capsule Take 1 capsule (10 mg total) by mouth daily. Take 14 days on, 7 days off, repeat every 21 days 14 capsule 0  . levothyroxine (SYNTHROID) 50 MCG tablet Take 1 tablet (50  mcg total) by mouth daily before breakfast. On Tuesday, Thursday, Saturday and Sunday    . levothyroxine (SYNTHROID) 75 MCG tablet Take 1 tablet (75 mcg total) by mouth daily before breakfast. On Monday, Wednesday and Friday    . lidocaine (LIDODERM) 5 % Place 1 patch onto the skin daily. Remove & Discard patch within 12 hours or as directed by MD 30 patch 0  . magnesium oxide (MAG-OX) 400 MG tablet Take 200 mg by mouth daily.    Marland Kitchen mexiletine (MEXITIL) 200 MG capsule Take 1 capsule (200 mg total) by mouth every 12 (twelve) hours. 60 capsule 1  . midodrine (PROAMATINE) 10 MG tablet Take 1 tablet (10 mg total) by mouth 2 (two) times daily with a meal.    . nystatin cream (MYCOSTATIN) Apply 1 application topically 2 (two) times daily.    . ondansetron (ZOFRAN) 8 MG tablet Take 1 tablet (8 mg total) by mouth 2 (two) times daily as needed (Nausea or vomiting). 60 tablet 2  . OXYGEN Inhale 2 L into the lungs continuous.    . pantoprazole (PROTONIX) 40 MG tablet Take 40 mg by mouth daily.     . polyethylene glycol (MIRALAX / GLYCOLAX) 17 g packet Take 17 g by mouth daily. 14 each 0  . potassium chloride SA (KLOR-CON) 20 MEQ tablet Take 20 mEq by mouth daily.     . Prenatal Vit-Fe Fumarate-FA (PRENATAL MULTIVITAMIN) TABS tablet Take 1 tablet by mouth daily.    . prochlorperazine (COMPAZINE) 10 MG tablet Take 1 tablet (10 mg total) by mouth every 6 (six) hours as needed (Nausea or vomiting). 60 tablet 2  . simvastatin (ZOCOR) 40 MG tablet Take 20 mg by mouth at bedtime.   2  . spironolactone (ALDACTONE) 25 MG tablet Take 25 mg by mouth daily.     . sucralfate (CARAFATE) 1 g tablet Take 1 tablet by mouth 3 (three) times daily before meals.    . traMADol (ULTRAM) 50 MG tablet Take 1 tablet (50 mg total) by mouth every 6 (six) hours as needed for moderate pain. 30 tablet 0  . traZODone (DESYREL) 50 MG tablet Take 50 mg by mouth at bedtime.    . torsemide (DEMADEX) 20 MG tablet Take 2 tablets (40 mg total) by  mouth daily. Future refills need to come from kidney doctor (Patient not taking: Reported on 04/13/2020) 60 tablet 1   No current facility-administered medications for this visit.    OBJECTIVE: Vitals:   04/13/20 1128  BP: 115/60  Pulse: 77  Resp: 20  Temp: 98 F (36.7 C)  SpO2: 100%     Body mass index is 29.87 kg/m.    ECOG FS:2 - Symptomatic, <50% confined to bed  General: Well-developed, well-nourished, no acute distress.  Sitting in a wheelchair. Eyes: Pink conjunctiva, anicteric sclera. HEENT: Normocephalic, moist mucous membranes. Lungs: No audible wheezing or coughing. Heart: Regular rate and rhythm. Abdomen: Soft, nontender, no obvious distention. Musculoskeletal: No edema, cyanosis, or clubbing. Neuro: Alert, answering all questions appropriately. Cranial nerves grossly intact. Skin: No rashes or petechiae noted. Psych: Normal affect.   LAB RESULTS:  Lab Results  Component Value Date   NA 138 04/13/2020   K 4.3 04/13/2020   CL 93 (L) 04/13/2020   CO2 33 (H) 04/13/2020   GLUCOSE 114 (H) 04/13/2020   BUN 29 (H) 04/13/2020   CREATININE 1.23 (H) 04/13/2020   CALCIUM 9.0 04/13/2020   PROT 6.7 04/13/2020   ALBUMIN 3.7 04/13/2020   AST 37 04/13/2020   ALT 49 (H) 04/13/2020   ALKPHOS 110 04/13/2020   BILITOT 0.6 04/13/2020   GFRNONAA 45 (L) 04/13/2020   GFRAA 30 (L) 09/09/2019    Lab Results  Component Value Date   WBC 9.6 04/13/2020   NEUTROABS 7.4 04/13/2020   HGB 10.3 (L) 04/13/2020   HCT 32.5 (L) 04/13/2020   MCV 95.3 04/13/2020   PLT 281 04/13/2020   Lab Results  Component Value Date   IRON 88 04/13/2020   TIBC 424 04/13/2020   IRONPCTSAT 21 04/13/2020   Lab Results  Component Value Date   FERRITIN 102 04/13/2020     STUDIES: DG Chest 1 View  Result Date: 03/28/2020 CLINICAL DATA:  Fall.  Landed on her RIGHT side.  RIGHT hip pain. EXAM: CHEST  1 VIEW COMPARISON:  03/15/2020 FINDINGS: Heart is enlarged, stable in configuration.  LEFT-sided AICD is unchanged. No focal consolidations or pleural effusions. No pulmonary edema. Atherosclerotic calcification of the thoracic aorta. IMPRESSION: Stable cardiomegaly. Electronically Signed   By: Nolon Nations M.D.   On: 03/28/2020 15:38   DG Chest 2 View  Result Date: 03/15/2020 CLINICAL DATA:  79 year old female with cough for 2-3 months and drainage. Multiple myeloma. EXAM: CHEST - 2 VIEW COMPARISON:  Chest radiographs 08/27/2019 and earlier. FINDINGS: Chronic cardiomegaly, left chest AICD. Improved lung volumes, and resolved small pleural effusions since April. No pneumothorax. Stable pulmonary vascularity, no overt edema. No confluent pulmonary opacity. Calcified aortic atherosclerosis. Stable visualized osseous structures. Negative visible bowel gas pattern. IMPRESSION: Cardiomegaly. Resolved pleural effusions since April. No acute cardiopulmonary abnormality. Electronically Signed   By: Genevie Ann M.D.   On: 03/15/2020 12:14   DG Thoracic Spine 2 View  Result Date: 03/31/2020 CLINICAL DATA:  Generalized back pain for few days. RIGHT hip arthroplasty on 03/29/2020 EXAM: THORACIC SPINE 2 VIEWS COMPARISON:  Previous CT imaging is available for comparison. FINDINGS: Pacer defibrillator leads project over the central chest. Osteopenia. No sign of acute fracture or static malalignment of the thoracic spine. Signs of coronary artery disease and prior coronary artery stenting. Vascular calcifications in the thoracic and abdominal aorta. IMPRESSION: 1. No signs of acute fracture or static malalignment. 2. Signs of vascular disease, coronary artery disease and coronary artery stenting. Electronically Signed   By: Zetta Bills M.D.   On: 03/31/2020 08:13   DG Lumbar Spine 2-3 Views  Result Date: 03/31/2020 CLINICAL DATA:  Generalized back pain for few days. RIGHT hip arthroplasty on 03/29/2020. Back pain since surgery. EXAM: LUMBAR SPINE - 2-3 VIEW COMPARISON:  CT of the abdomen and  pelvis  from March of 2021 in metastatic bone survey from October of 2021 FINDINGS: Lumbar spine with degenerative changes. Six lumbar type vertebral bodies. Osteopenia. Vertebral body heights are maintained with disc space narrowing at multiple levels greatest at L1-L2 and L2-L3. Mild anterolisthesis of the fourth on the fifth lumbar vertebral body approximately 2 mm to 3 mm is similar to previous imaging with mild retrolisthesis of the fifth on the sixth lumbar type vertebral body. Disc space narrowing also pronounced at the fifth and sixth lumbar level and at the lumbosacral transition. Facet arthropathy is demonstrated. There may be new anterior translation, anterolisthesis of the sixth lumbar vertebral level on the first sacral type vertebral body approximately 2-3 mm since the previous exam. No sign of acute fracture in the lumbar spine. Incidental note is made of cholecystectomy clips in the RIGHT upper quadrant and renal calculus projecting over the LEFT renal contour approximately 5 mm seen also on the prior CT study from March of 2021 IMPRESSION: Multilevel degenerative changes greatest in lower lumbar spine with potential new mild anterolisthesis of the lowest lumbar vertebral elements on the first sacral vertebral body no signs of fracture. Correlate with radicular symptoms and cross-sectional imaging as warranted. LEFT renal calculus. Electronically Signed   By: Zetta Bills M.D.   On: 03/31/2020 08:23   CT Head Wo Contrast  Result Date: 03/28/2020 CLINICAL DATA:  Trauma. EXAM: CT HEAD WITHOUT CONTRAST CT CERVICAL SPINE WITHOUT CONTRAST TECHNIQUE: Multidetector CT imaging of the head and cervical spine was performed following the standard protocol without intravenous contrast. Multiplanar CT image reconstructions of the cervical spine were also generated. COMPARISON:  None. FINDINGS: CT HEAD FINDINGS Brain: No evidence of acute large vascular territory infarction, hemorrhage, hydrocephalus, extra-axial  collection or mass lesion/mass effect. Remote right frontal infarct with encephalomalacia. Patchy white matter hypoattenuation, compatible with chronic microvascular ischemic disease. Generalized cerebral atrophy. Vascular: Calcific atherosclerosis. Skull: No acute fracture Sinuses/Orbits: Visualized sinuses are clear. Unremarkable visualized orbits. Other: No mastoid effusions. CT CERVICAL SPINE FINDINGS Alignment: Mild reversal of the normal cervical lordosis, likely positional. Mild anterolisthesis of C3 on C4, likely degenerative given facet arthropathy at this level. Otherwise, no substantial subluxation. Skull base and vertebrae: No acute fracture. Vertebral body heights are maintained. Soft tissues and spinal canal: No prevertebral fluid or swelling. No visible canal hematoma. Disc levels: Mild multilevel degenerative disc disease. Multilevel facet arthropathy, greatest at C3-C4. Upper chest: Emphysema. Other: Calcific atherosclerosis of the carotids. IMPRESSION: CT head: 1. No evidence of acute intracranial abnormality. 2. Remote right frontal infarct with encephalomalacia. 3. Chronic microvascular ischemic disease. CT cervical spine: No evidence of acute fracture or traumatic malalignment. Electronically Signed   By: Margaretha Sheffield MD   On: 03/28/2020 15:45   CT Cervical Spine Wo Contrast  Result Date: 03/28/2020 CLINICAL DATA:  Trauma. EXAM: CT HEAD WITHOUT CONTRAST CT CERVICAL SPINE WITHOUT CONTRAST TECHNIQUE: Multidetector CT imaging of the head and cervical spine was performed following the standard protocol without intravenous contrast. Multiplanar CT image reconstructions of the cervical spine were also generated. COMPARISON:  None. FINDINGS: CT HEAD FINDINGS Brain: No evidence of acute large vascular territory infarction, hemorrhage, hydrocephalus, extra-axial collection or mass lesion/mass effect. Remote right frontal infarct with encephalomalacia. Patchy white matter hypoattenuation,  compatible with chronic microvascular ischemic disease. Generalized cerebral atrophy. Vascular: Calcific atherosclerosis. Skull: No acute fracture Sinuses/Orbits: Visualized sinuses are clear. Unremarkable visualized orbits. Other: No mastoid effusions. CT CERVICAL SPINE FINDINGS Alignment: Mild reversal of the normal cervical lordosis,  likely positional. Mild anterolisthesis of C3 on C4, likely degenerative given facet arthropathy at this level. Otherwise, no substantial subluxation. Skull base and vertebrae: No acute fracture. Vertebral body heights are maintained. Soft tissues and spinal canal: No prevertebral fluid or swelling. No visible canal hematoma. Disc levels: Mild multilevel degenerative disc disease. Multilevel facet arthropathy, greatest at C3-C4. Upper chest: Emphysema. Other: Calcific atherosclerosis of the carotids. IMPRESSION: CT head: 1. No evidence of acute intracranial abnormality. 2. Remote right frontal infarct with encephalomalacia. 3. Chronic microvascular ischemic disease. CT cervical spine: No evidence of acute fracture or traumatic malalignment. Electronically Signed   By: Margaretha Sheffield MD   On: 03/28/2020 15:45   DG HIP UNILAT W OR W/O PELVIS 2-3 VIEWS RIGHT  Result Date: 03/29/2020 CLINICAL DATA:  Postop from right hip arthroplasty. EXAM: DG HIP (WITH OR WITHOUT PELVIS) 2-3V RIGHT COMPARISON:  None. FINDINGS: A right hip prosthesis is seen in expected position. No evidence of fracture or dislocation. IMPRESSION: Expected postoperative appearance of right hip prosthesis. Electronically Signed   By: Marlaine Hind M.D.   On: 03/29/2020 20:20   DG Hip Unilat W or Wo Pelvis 2-3 Views Right  Result Date: 03/28/2020 CLINICAL DATA:  Fall EXAM: DG HIP (WITH OR WITHOUT PELVIS) 2-3V RIGHT COMPARISON:  None. FINDINGS: There is a comminuted impacted minimally displaced fracture of the right transcervical femoral neck involving the greater trochanter. The femoral head is still well  seated within the acetabulum. Overlying soft tissue swelling is noted. IMPRESSION: Comminuted mildly impacted right transcervical femoral neck fracture involving the greater trochanter. Electronically Signed   By: Prudencio Pair M.D.   On: 03/28/2020 15:38    ASSESSMENT: Multiple myeloma, fractured right hip.  PLAN:    1.  Multiple myeloma: Bone marrow biopsy confirmed diagnosis with plasma cells up to 90% of biopsy.  Cytogenetics are reported as normal.  Metastatic bone survey from March 03, 2020 reviewed independently and report as above with multiple skeletal lucencies consistent with myeloma.  M spike is only mildly elevated at 1.2, but she has a significantly elevated IgA level of 2773.  Kappa and lambda free light chains are within normal limits.  Patient noted to have endorgan damage of renal insufficiency as well as persistent anemia.  She will benefit from Velcade on days 1, 4, 8, and 11 along with 10 mg Revlimid on days 1 through 14.  Revlimid has been dose reduced secondary to renal insufficiency.  Patient will also receive 20 mg dexamethasone 1 day after each Velcade injection.  Patient only completed cycle 1 of treatment prior to her right hip fracture.  We will continue to delay treatment until she has completed rehab.  Return to clinic on May 23, 2020 for further evaluation and treatment planning.  2.  Anemia: Hemoglobin has improved to 10.3.  Iron stores are within normal limits.  Likely multifactorial with chronic renal insufficiency, underlying myeloma, as well as history of recent GI bleed.  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  Monitor. 3.  Chronic renal insufficiency: Creatinine improved to 1.23.  Continue with dose reduced Revlimid as above.  Velcade does not need to be dosed reduced and renal dysfunction.  Monitor.   4.  Ascites: Patient does not complain of this today.  Continue follow-up with heart failure clinic as  scheduled. 5.  Angiodysplastic lesions: Continue follow-up with GI as scheduled. 6.  Diarrhea: Patient does not complain of this today.  Continue Lomotil as  needed. 7.  Right hip fracture: Continue rehab and follow-up with orthopedics as scheduled.   Patient expressed understanding and was in agreement with this plan. She also understands that She can call clinic at any time with any questions, concerns, or complaints.    Lloyd Huger, MD   04/14/2020 10:06 AM

## 2020-04-17 ENCOUNTER — Other Ambulatory Visit: Payer: Medicare Other

## 2020-04-17 ENCOUNTER — Ambulatory Visit: Payer: Medicare Other

## 2020-04-20 ENCOUNTER — Other Ambulatory Visit: Payer: Self-pay

## 2020-04-20 ENCOUNTER — Ambulatory Visit: Payer: Medicare Other

## 2020-04-20 DIAGNOSIS — C9000 Multiple myeloma not having achieved remission: Secondary | ICD-10-CM

## 2020-04-20 MED ORDER — LENALIDOMIDE 10 MG PO CAPS: 10.0000 mg | ORAL_CAPSULE | Freq: Every day | ORAL | 0 refills | Status: DC

## 2020-04-27 ENCOUNTER — Other Ambulatory Visit: Payer: Self-pay | Admitting: Oncology

## 2020-05-01 ENCOUNTER — Other Ambulatory Visit: Payer: Self-pay | Admitting: Family

## 2020-05-15 ENCOUNTER — Telehealth: Payer: Self-pay | Admitting: *Deleted

## 2020-05-15 ENCOUNTER — Other Ambulatory Visit: Payer: Self-pay | Admitting: Nurse Practitioner

## 2020-05-15 DIAGNOSIS — C9000 Multiple myeloma not having achieved remission: Secondary | ICD-10-CM

## 2020-05-15 MED ORDER — HYDROCODONE-ACETAMINOPHEN 5-325 MG PO TABS
1.0000 | ORAL_TABLET | ORAL | 0 refills | Status: DC | PRN
Start: 2020-05-15 — End: 2020-05-30

## 2020-05-15 MED ORDER — HYDROCODONE-ACETAMINOPHEN 5-325 MG PO TABS
1.0000 | ORAL_TABLET | ORAL | 0 refills | Status: DC | PRN
Start: 2020-05-15 — End: 2020-05-15

## 2020-05-15 NOTE — Telephone Encounter (Signed)
I don't think we have chair availability and my clinic is already packed this week.  Keep appointment as scheduled, 1 week will not make a difference.

## 2020-05-15 NOTE — Telephone Encounter (Signed)
Patient daughter called reporting that patient has an appointment next week and that she has not had chemotherapy for a while due to fractured hip and that she is starting to ache in her joints and they feel her cancer is causing this and would like to know if her appointment for treatment can be moved up to this week. Please advise

## 2020-05-15 NOTE — Telephone Encounter (Signed)
Spoke with pt daughter who states pt was napping.  RN asked if pt was taking the norco/vicodin 5-36m 1-2 tablets every 4 hours.  Daughter stated that the medication was ordered for hip pain and is trying to "wean off of it". Daughter states that pt states "the pain she is experiencing is more body aches all over and to the bone".  States pt very restless and unable to sleep at night because of pain.  RN asked if pt was taking tramadol and daughter stated pt  Doesn't like taking it because "it makes her feel weird".  RN stated she would speak with NP LBeckey Rutterand call back as soon as possible.  Daughter verbalized understanding and [place] [place]

## 2020-05-15 NOTE — Telephone Encounter (Signed)
Heather Peterson spoke to patient. I refilled her pain medication and she will follow up with Dr. Grayland Ormond as scheduled. If symptoms worsen in the interim, she will notify clinic to be evaluated.

## 2020-05-15 NOTE — Telephone Encounter (Signed)
Call returned to daughter and informed of doctor response, She was alright with eating until next week, but asks if something can be done for the discomfort patient has and states that the Noro she is taking is not helping with the aching joints patient has. Please advise

## 2020-05-15 NOTE — Telephone Encounter (Signed)
RN called and spoke with daughter [name] and instructed that NP could refill the norco/vicodin or she could do an virtual visit tomorrow.  Daughter stated to call in refill and they would discuss with Dr Grayland Ormond on their visit on Tuesday.

## 2020-05-15 NOTE — Telephone Encounter (Signed)
[facility] if possible.

## 2020-05-20 NOTE — Progress Notes (Signed)
[place]  [phone] [phone]) [phone]  ID: Kristyl Athens Caudle OB: 05/28/1940  MR#: [id number]  SAY#:[id number]  [name], MD as PCP - General (Family Medicine) Lloyd Huger, MD as Consulting Physician (Oncology)  CHIEF COMPLAINT: Multiple myeloma.  INTERVAL HISTORY: Patient returns to clinic today for further evaluation and reinitiation of Velcade and Revlimid.  She continues to have hip pain from her fracture, but her mobility and weakness and fatigue are improving.  She has chronic shortness of breath and requires oxygen 24 hours/day. She has no neurologic complaints.  She denies any recent fevers.  She has a fair appetite.  She denies any chest pain, cough, or hemoptysis.  She denies any nausea, vomiting, or constipation.  She has no melena or hematochezia.  She has no urinary complaints.  Patient offers no further specific complaints today.  REVIEW OF SYSTEMS:   Review of Systems  Constitutional: Positive for malaise/fatigue. Negative for fever.  Respiratory: Positive for shortness of breath. Negative for cough and hemoptysis.   Cardiovascular: Negative.  Negative for chest pain and leg swelling.  Gastrointestinal: Negative.  Negative for abdominal pain, diarrhea, melena and nausea.  Genitourinary: Negative.  Negative for dysuria and hematuria.  Musculoskeletal: Positive for joint pain. Negative for back pain.  Skin: Negative.  Negative for rash.  Neurological: Positive for weakness. Negative for dizziness, focal weakness and headaches.  Psychiatric/Behavioral: Negative.  The patient is not nervous/anxious.     As per HPI. Otherwise, a complete review of systems is negative.  PAST MEDICAL HISTORY: Past Medical History:  Diagnosis Date  . Anxiety   . Chronic combined systolic and diastolic CHF (congestive heart failure) ([place])   . Chronic kidney disease   . Coronary artery disease   . Depression   .  Diabetes mellitus without complication ([place])   . Diabetes mellitus, type II ([place])   . Hypertension   . MI (myocardial infarction) ([place])    x 5  . Pacemaker   . Prolonged Q-T interval on ECG   . Thyroid disease     PAST SURGICAL HISTORY: Past Surgical History:  Procedure Laterality Date  . CENTRAL LINE INSERTION  03/11/2017   Procedure: CENTRAL LINE INSERTION;  Surgeon: Leonie Man, MD;  Location: [place] [facility];  Service: Cardiovascular;;  . CHOLECYSTECTOMY    . COLONOSCOPY WITH PROPOFOL N/A 09/01/2019   Procedure: COLONOSCOPY WITH PROPOFOL;  Surgeon: Toledo, Benay Pike, MD;  Location: [facility] ENDOSCOPY;  Service: Gastroenterology;  Laterality: N/A;  . CORONARY STENT INTERVENTION W/IMPELLA N/A 03/11/2017   Procedure: Coronary Stent Intervention w/Impella;  Surgeon: Leonie Man, MD;  Location: [place] [facility];  Service: Cardiovascular;  Laterality: N/A;  . ESOPHAGOGASTRODUODENOSCOPY (EGD) WITH PROPOFOL N/A 09/01/2019   Procedure: ESOPHAGOGASTRODUODENOSCOPY (EGD) WITH PROPOFOL;  Surgeon: Toledo, Benay Pike, MD;  Location: [facility] ENDOSCOPY;  Service: Gastroenterology;  Laterality: N/A;  . ESOPHAGOGASTRODUODENOSCOPY (EGD) WITH PROPOFOL N/A 09/08/2019   Procedure: ESOPHAGOGASTRODUODENOSCOPY (EGD) WITH PROPOFOL;  Surgeon: [name], MD;  Location: [facility] ENDOSCOPY;  Service: Gastroenterology;  Laterality: N/A;  . EYE SURGERY    . HIP ARTHROPLASTY Right 03/29/2020   Procedure: ARTHROPLASTY BIPOLAR HIP (HEMIARTHROPLASTY);  Surgeon: Corky Mull, MD;  Location: [facility] ORS;  Service: Orthopedics;  Laterality: Right;  . INTRAVASCULAR PRESSURE WIRE/FFR STUDY N/A 03/11/2017   Procedure: INTRAVASCULAR PRESSURE WIRE/FFR STUDY;  Surgeon: Leonie Man, MD;  Location: [place] [facility];  Service: Cardiovascular;  Laterality: N/A;  . INTRAVASCULAR ULTRASOUND/IVUS N/A  03/11/2017   Procedure: Intravascular Ultrasound/IVUS;  Surgeon: Leonie Man, MD;  Location: [place] [facility];  Service:  Cardiovascular;  Laterality: N/A;  . LEFT HEART CATH AND CORONARY ANGIOGRAPHY N/A 03/05/2017   Procedure: LEFT HEART CATH AND CORONARY ANGIOGRAPHY;  Surgeon: Isaias Cowman, MD;  Location: [place] [facility];  Service: Cardiovascular;  Laterality: N/A;  . PACEMAKER IMPLANT    . pacemaker/defibrillator Left     FAMILY HISTORY: Family History  Problem Relation Age of Onset  . Hypertension Father   . Heart attack Father   . Depression Sister   . Depression Brother   . Depression Brother     ADVANCED DIRECTIVES (Y/N):  N  HEALTH MAINTENANCE: Social History   Tobacco Use  . Smoking status: Current Every Day Smoker    Packs/day: 0.25    Types: E-cigarettes, Cigarettes  . Smokeless tobacco: Never Used  Vaping Use  . Vaping Use: Former  Substance Use Topics  . Alcohol use: Not Currently    Comment: occasionally  . Drug use: No     Colonoscopy:  PAP:  Bone density:  Lipid panel:  Allergies  Allergen Reactions  . Celebrex [Celecoxib] Anaphylaxis  . Glipizide Anaphylaxis  . Levaquin [Levofloxacin In D5w] Other (See Comments)    Heart arrhthymias  . Levofloxacin Other (See Comments) and Palpitations    ICD fired  . Lisinopril Swelling    Lip and facial swelling  . Sulfa Antibiotics Other (See Comments) and Anaphylaxis    Reaction: unknown  . Metformin Other (See Comments)    Gi tolerance   . Penicillins Rash and Other (See Comments)    Has patient had a PCN reaction causing immediate rash, facial/tongue/throat swelling, SOB or lightheadedness with hypotension: Unknown Has patient had a PCN reaction causing severe rash involving mucus membranes or skin necrosis: No Has patient had a PCN reaction that required hospitalization: No Has patient had a PCN reaction occurring within the last 10 years: No If all of the above answers are "NO", then may proceed with Cephalosporin use.     Current Outpatient Medications  Medication Sig Dispense Refill  . allopurinol  (ZYLOPRIM) 100 MG tablet Take 50 mg by mouth daily.    Marland Kitchen aspirin EC 81 MG tablet Take 81 mg by mouth at bedtime.     . chlorpheniramine-HYDROcodone (TUSSIONEX) 10-8 MG/5ML SUER Take 5 mLs by mouth every 12 (twelve) hours as needed for cough. 140 mL 0  . colchicine 0.6 MG tablet Take 0.6 mg by mouth 2 (two) times daily.    Marland Kitchen dexamethasone (DECADRON) 4 MG tablet Take 5 tablets (20 mg total) by mouth daily. Take the day after Velcade on days 2,5,9,12. Take with breakfast 40 tablet 3  . docusate sodium (COLACE) 100 MG capsule Take 1 capsule (100 mg total) by mouth 2 (two) times daily. 10 capsule 0  . Ensure Max Protein (ENSURE MAX PROTEIN) LIQD Take 330 mLs (11 oz total) by mouth 2 (two) times daily between meals.    . fexofenadine (ALLEGRA) 180 MG tablet Take 180 mg by mouth daily.    Marland Kitchen FLUoxetine (PROZAC) 10 MG capsule Take 10 mg by mouth daily.    . folic acid (FOLVITE) 1 MG tablet folic acid 1 mg tablet    . HYDROcodone-acetaminophen (NORCO/VICODIN) 5-325 MG tablet Take 1-2 tablets by mouth every 4 (four) hours as needed for moderate pain. 30 tablet 0  . hydrocortisone (ANUSOL-HC) 2.5 % rectal cream Apply 1 application topically 2 (two) times  daily.    Marland Kitchen LANTUS SOLOSTAR 100 UNIT/ML Solostar Pen Inject 16 Units into the skin at bedtime.    Marland Kitchen levothyroxine (SYNTHROID) 50 MCG tablet Take 1 tablet (50 mcg total) by mouth daily before breakfast. On Tuesday, Thursday, Saturday and Sunday    . levothyroxine (SYNTHROID) 75 MCG tablet Take 1 tablet (75 mcg total) by mouth daily before breakfast. On Monday, Wednesday and Friday    . magnesium oxide (MAG-OX) 400 MG tablet Take 200 mg by mouth daily.    Marland Kitchen mexiletine (MEXITIL) 200 MG capsule Take 1 capsule (200 mg total) by mouth every 12 (twelve) hours. 60 capsule 1  . midodrine (PROAMATINE) 10 MG tablet Take 1 tablet (10 mg total) by mouth 2 (two) times daily with a meal.    . ondansetron (ZOFRAN) 8 MG tablet Take 1 tablet (8 mg total) by mouth 2 (two) times  daily as needed (Nausea or vomiting). 60 tablet 2  . OXYGEN Inhale 2 L into the lungs continuous.    . pantoprazole (PROTONIX) 40 MG tablet Take 40 mg by mouth daily.     . polyethylene glycol (MIRALAX / GLYCOLAX) 17 g packet Take 17 g by mouth daily. 14 each 0  . potassium chloride SA (KLOR-CON) 20 MEQ tablet Take 20 mEq by mouth daily.     . Prenatal Vit-Fe Fumarate-FA (PRENATAL MULTIVITAMIN) TABS tablet Take 1 tablet by mouth daily.    . prochlorperazine (COMPAZINE) 10 MG tablet Take 1 tablet (10 mg total) by mouth every 6 (six) hours as needed (Nausea or vomiting). 60 tablet 2  . simvastatin (ZOCOR) 40 MG tablet Take 20 mg by mouth at bedtime.   2  . spironolactone (ALDACTONE) 25 MG tablet Take 25 mg by mouth daily.     . sucralfate (CARAFATE) 1 g tablet Take 1 tablet by mouth 3 (three) times daily before meals.    . torsemide (DEMADEX) 20 MG tablet Take 2 tablets (40 mg total) by mouth daily. Future refills need to come from kidney doctor 60 tablet 1  . traMADol (ULTRAM) 50 MG tablet Take 1 tablet (50 mg total) by mouth every 6 (six) hours as needed for moderate pain. 30 tablet 0  . traZODone (DESYREL) 50 MG tablet Take 50 mg by mouth at bedtime.    . ferrous sulfate 325 (65 FE) MG tablet Take 1 tablet (325 mg total) by mouth 2 (two) times daily with a meal. (Patient not taking: Reported on 05/23/2020)  3  . lenalidomide (REVLIMID) 10 MG capsule Take 1 capsule (10 mg total) by mouth daily. Take 14 days on, 7 days off, repeat every 21 days (Patient not taking: Reported on 05/23/2020) 14 capsule 0  . lidocaine (LIDODERM) 5 % Place 1 patch onto the skin daily. Remove & Discard patch within 12 hours or as directed by MD (Patient not taking: Reported on 05/23/2020) 30 patch 0  . nystatin cream (MYCOSTATIN) Apply 1 application topically 2 (two) times daily. (Patient not taking: Reported on 05/23/2020)     No current facility-administered medications for this visit.    OBJECTIVE: Vitals:   05/23/20  1106  BP: 108/70  Pulse: 72  Resp: 16  Temp: 97.6 F (36.4 C)     Body mass index is 28.22 kg/m.    ECOG FS:1 - Symptomatic but completely ambulatory  General: Well-developed, well-nourished, no acute distress.  Sitting in a wheelchair. Eyes: Pink conjunctiva, anicteric sclera. HEENT: Normocephalic, moist mucous membranes. Lungs: No audible wheezing or coughing. Heart:  Regular rate and rhythm. Abdomen: Soft, nontender, no obvious distention. Musculoskeletal: No edema, cyanosis, or clubbing. Neuro: Alert, answering all questions appropriately. Cranial nerves grossly intact. Skin: No rashes or petechiae noted. Psych: Normal affect.    LAB RESULTS:  Lab Results  Component Value Date   NA 135 05/23/2020   K 5.0 05/23/2020   CL 93 (L) 05/23/2020   CO2 29 05/23/2020   GLUCOSE 111 (H) 05/23/2020   BUN 41 (H) 05/23/2020   CREATININE 1.77 (H) 05/23/2020   CALCIUM 9.4 05/23/2020   PROT 7.1 05/23/2020   ALBUMIN 4.2 05/23/2020   AST 23 05/23/2020   ALT 15 05/23/2020   ALKPHOS 88 05/23/2020   BILITOT 0.5 05/23/2020   GFRNONAA 29 (L) 05/23/2020   GFRAA 30 (L) 09/09/2019    Lab Results  Component Value Date   WBC 6.6 05/23/2020   NEUTROABS 3.9 05/23/2020   HGB 11.2 (L) 05/23/2020   HCT 34.5 (L) 05/23/2020   MCV 97.2 05/23/2020   PLT 257 05/23/2020   Lab Results  Component Value Date   IRON 85 05/23/2020   TIBC 421 05/23/2020   IRONPCTSAT 20 05/23/2020   Lab Results  Component Value Date   FERRITIN 50 05/23/2020     STUDIES: No results found.  ASSESSMENT: Multiple myeloma, fractured right hip.  PLAN:    1.  Multiple myeloma: Bone marrow biopsy confirmed diagnosis with plasma cells up to 90% of biopsy.  Cytogenetics are reported as normal.  Metastatic bone survey from March 03, 2020 reviewed independently and report as above with multiple skeletal lucencies consistent with myeloma.  Her most recent M spike is only mildly elevated at 1.2, but she has a  significantly elevated IgA level of 2773.  Kappa and lambda free light chains are within normal limits.  Patient noted to have endorgan damage of renal insufficiency as well as persistent anemia.  She will benefit from Velcade on days 1, 4, 8, and 11 along with 10 mg Revlimid on days 1 through 14.  Revlimid has been dose reduced secondary to renal insufficiency.  Patient will also receive weekly 20 mg dexamethasone.  Patient only completed cycle 1 of treatment prior to her right hip fracture.  Proceed with cycle 2, day 1 of treatment today.  Return to clinic on Friday for cycle 2, day 4 and then in 1 week for further evaluation and consideration of cycle 2, day 8.  Appreciate clinical pharmacy input. 2.  Anemia: Improved.  Iron stores are within normal limits.  Likely multifactorial with chronic renal insufficiency, underlying myeloma, as well as history of recent GI bleed.  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  Monitor. 3.  Chronic renal insufficiency: Creatinine is trended up to 1.77.  Continue with dose reduced Revlimid as above.  Velcade does not need to be dosed reduced and renal dysfunction.  Monitor.   4.  Ascites: Patient does not complain of this today.  Continue follow-up with heart failure clinic as scheduled. 5.  Angiodysplastic lesions: Continue follow-up with GI as scheduled. 6.  Diarrhea: Patient does not complain of this today.  Continue Lomotil as needed. 7.  Right hip fracture: Improving.  Continue rehab and follow-up with orthopedics as scheduled.  I spent a total of 30 minutes reviewing chart data, face-to-face evaluation with the patient, counseling and coordination of care as detailed above.    Patient expressed understanding and was in agreement with this plan. She also understands that She  can call clinic at any time with any questions, concerns, or complaints.    Lloyd Huger, MD   05/24/2020 9:14 AM

## 2020-05-23 ENCOUNTER — Inpatient Hospital Stay (HOSPITAL_BASED_OUTPATIENT_CLINIC_OR_DEPARTMENT_OTHER): Payer: Medicare Other | Admitting: Oncology

## 2020-05-23 ENCOUNTER — Encounter: Payer: Self-pay | Admitting: Oncology

## 2020-05-23 ENCOUNTER — Inpatient Hospital Stay: Payer: Medicare Other | Attending: Oncology

## 2020-05-23 ENCOUNTER — Inpatient Hospital Stay: Payer: Medicare Other

## 2020-05-23 ENCOUNTER — Inpatient Hospital Stay: Payer: Medicare Other | Admitting: Pharmacist

## 2020-05-23 VITALS — BP 108/70 | HR 72 | Temp 97.6°F | Resp 16 | Wt 164.4 lb

## 2020-05-23 DIAGNOSIS — C9000 Multiple myeloma not having achieved remission: Secondary | ICD-10-CM | POA: Insufficient documentation

## 2020-05-23 DIAGNOSIS — N189 Chronic kidney disease, unspecified: Secondary | ICD-10-CM | POA: Insufficient documentation

## 2020-05-23 DIAGNOSIS — Z79899 Other long term (current) drug therapy: Secondary | ICD-10-CM | POA: Diagnosis not present

## 2020-05-23 DIAGNOSIS — Z5111 Encounter for antineoplastic chemotherapy: Secondary | ICD-10-CM | POA: Insufficient documentation

## 2020-05-23 DIAGNOSIS — E1122 Type 2 diabetes mellitus with diabetic chronic kidney disease: Secondary | ICD-10-CM | POA: Insufficient documentation

## 2020-05-23 DIAGNOSIS — D649 Anemia, unspecified: Secondary | ICD-10-CM | POA: Insufficient documentation

## 2020-05-23 DIAGNOSIS — I130 Hypertensive heart and chronic kidney disease with heart failure and stage 1 through stage 4 chronic kidney disease, or unspecified chronic kidney disease: Secondary | ICD-10-CM | POA: Insufficient documentation

## 2020-05-23 LAB — COMPREHENSIVE METABOLIC PANEL
ALT: 15 U/L (ref 0–44)
AST: 23 U/L (ref 15–41)
Albumin: 4.2 g/dL (ref 3.5–5.0)
Alkaline Phosphatase: 88 U/L (ref 38–126)
Anion gap: 13 (ref 5–15)
BUN: 41 mg/dL — ABNORMAL HIGH (ref 8–23)
CO2: 29 mmol/L (ref 22–32)
Calcium: 9.4 mg/dL (ref 8.9–10.3)
Chloride: 93 mmol/L — ABNORMAL LOW (ref 98–111)
Creatinine, Ser: 1.77 mg/dL — ABNORMAL HIGH (ref 0.44–1.00)
GFR, Estimated: 29 mL/min — ABNORMAL LOW (ref >60)
Glucose, Bld: 111 mg/dL — ABNORMAL HIGH (ref 70–99)
Potassium: 5 mmol/L (ref 3.5–5.1)
Sodium: 135 mmol/L (ref 135–145)
Total Bilirubin: 0.5 mg/dL (ref 0.3–1.2)
Total Protein: 7.1 g/dL (ref 6.5–8.1)

## 2020-05-23 LAB — CBC WITH DIFFERENTIAL/PLATELET
Abs Immature Granulocytes: 0.02 10*3/uL (ref 0.00–0.07)
Basophils Absolute: 0.1 10*3/uL (ref 0.0–0.1)
Basophils Relative: 1 %
Eosinophils Absolute: 0.1 10*3/uL (ref 0.0–0.5)
Eosinophils Relative: 2 %
HCT: 34.5 % — ABNORMAL LOW (ref 36.0–46.0)
Hemoglobin: 11.2 g/dL — ABNORMAL LOW (ref 12.0–15.0)
Immature Granulocytes: 0 %
Lymphocytes Relative: 28 %
Lymphs Abs: 1.9 10*3/uL (ref 0.7–4.0)
MCH: 31.5 pg (ref 26.0–34.0)
MCHC: 32.5 g/dL (ref 30.0–36.0)
MCV: 97.2 fL (ref 80.0–100.0)
Monocytes Absolute: 0.7 10*3/uL (ref 0.1–1.0)
Monocytes Relative: 11 %
Neutro Abs: 3.9 10*3/uL (ref 1.7–7.7)
Neutrophils Relative %: 58 %
Platelets: 257 10*3/uL (ref 150–400)
RBC: 3.55 MIL/uL — ABNORMAL LOW (ref 3.87–5.11)
RDW: 17.2 % — ABNORMAL HIGH (ref 11.5–15.5)
WBC: 6.6 10*3/uL (ref 4.0–10.5)
nRBC: 0 % (ref 0.0–0.2)

## 2020-05-23 LAB — IRON AND TIBC
Iron: 85 ug/dL (ref 28–170)
Saturation Ratios: 20 % (ref 10.4–31.8)
TIBC: 421 ug/dL (ref 250–450)
UIBC: 336 ug/dL

## 2020-05-23 LAB — FERRITIN: Ferritin: 50 ng/mL (ref 11–307)

## 2020-05-23 MED ORDER — BORTEZOMIB CHEMO SQ INJECTION 3.5 MG (2.5MG/ML)
1.3000 mg/m2 | Freq: Once | INTRAMUSCULAR | Status: AC
  Administered 2020-05-23: 2.5 mg via SUBCUTANEOUS
  Filled 2020-05-23: qty 1

## 2020-05-23 MED ORDER — DEXAMETHASONE 4 MG PO TABS
20.0000 mg | ORAL_TABLET | Freq: Once | ORAL | Status: AC
  Administered 2020-05-23: 20 mg via ORAL
  Filled 2020-05-23: qty 5

## 2020-05-23 NOTE — Progress Notes (Signed)
 Oral Chemotherapy Clinic [facility]  [phone] [phone]  Patient Care Team: Feldpausch, Dale E, MD as PCP - General (Family Medicine) Finnegan, Timothy J, MD as Consulting Physician (Oncology)   Name of the patient: Heather Peterson  [id number]  03/12/1941   Date of visit: 05/23/20  HPI: Patient is a 79 y.o. female with multiple myeloma. She had once cycle of RVD prior to a right hip fracture which resulted in her treatment being held. She is here today to resume treatment.  Reason for Consult: Revlimid (lenalidomide) oral chemotherapy re-education.   PAST MEDICAL HISTORY: Past Medical History:  Diagnosis Date  . Anxiety   . Chronic combined systolic and diastolic CHF (congestive heart failure) (HCC)   . Chronic kidney disease   . Coronary artery disease   . Depression   . Diabetes mellitus without complication (HCC)   . Diabetes mellitus, type II (HCC)   . Hypertension   . MI (myocardial infarction) (HCC)    x 5  . Pacemaker   . Prolonged Q-T interval on ECG   . Thyroid disease     PAST SURGICAL HISTORY:  Past Surgical History:  Procedure Laterality Date  . CENTRAL LINE INSERTION  03/11/2017   Procedure: CENTRAL LINE INSERTION;  Surgeon: Harding, David W, MD;  [facility]: [facility];  Service: Cardiovascular;;  . CHOLECYSTECTOMY    . COLONOSCOPY WITH PROPOFOL N/A 09/01/2019   Procedure: COLONOSCOPY WITH PROPOFOL;  Surgeon: Toledo, Teodoro K, MD;  Location: [facility] ENDOSCOPY;  Service: Gastroenterology;  Laterality: N/A;  . CORONARY STENT INTERVENTION W/IMPELLA N/A 03/11/2017   Procedure: Coronary Stent Intervention w/Impella;  Surgeon: Harding, David W, MD;  Location: [facility];  Service: Cardiovascular;  Laterality: N/A;  . ESOPHAGOGASTRODUODENOSCOPY (EGD) WITH PROPOFOL N/A 09/01/2019   Procedure: ESOPHAGOGASTRODUODENOSCOPY (EGD) WITH PROPOFOL;  Surgeon: Toledo, Teodoro K, MD;  Location: [facility] ENDOSCOPY;   Service: Gastroenterology;  Laterality: N/A;  . ESOPHAGOGASTRODUODENOSCOPY (EGD) WITH PROPOFOL N/A 09/08/2019   Procedure: ESOPHAGOGASTRODUODENOSCOPY (EGD) WITH PROPOFOL;  Surgeon: Anna, Kiran, MD;  Location: [facility] ENDOSCOPY;  Service: Gastroenterology;  Laterality: N/A;  . EYE SURGERY    . HIP ARTHROPLASTY Right 03/29/2020   Procedure: ARTHROPLASTY BIPOLAR HIP (HEMIARTHROPLASTY);  Surgeon: Poggi, John J, MD;  Location: [facility] ORS;  Service: Orthopedics;  Laterality: Right;  . INTRAVASCULAR PRESSURE WIRE/FFR STUDY N/A 03/11/2017   Procedure: INTRAVASCULAR PRESSURE WIRE/FFR STUDY;  Surgeon: Harding, David W, MD;  Location: [facility];  Service: Cardiovascular;  Laterality: N/A;  . INTRAVASCULAR ULTRASOUND/IVUS N/A 03/11/2017   Procedure: Intravascular Ultrasound/IVUS;  Surgeon: Harding, David W, MD;  Location: [facility];  Service: Cardiovascular;  Laterality: N/A;  . LEFT HEART CATH AND CORONARY ANGIOGRAPHY N/A 03/05/2017   Procedure: LEFT HEART CATH AND CORONARY ANGIOGRAPHY;  Surgeon: Paraschos, Alexander, MD;  Location: [facility] CV [facility];  Service: Cardiovascular;  Laterality: N/A;  . PACEMAKER IMPLANT    . pacemaker/defibrillator Left     HEMATOLOGY/ONCOLOGY HISTORY:  Oncology History  Multiple myeloma (HCC)  03/01/2020 Initial Diagnosis   Multiple myeloma (HCC)   03/13/2020 -  Chemotherapy    Patient is on Treatment Plan: MYELOMA NON-TRANSPLANT CANDIDATES VRD SQ Q21D         ALLERGIES:  is allergic to celebrex [celecoxib], glipizide, levaquin [levofloxacin in d5w], levofloxacin, lisinopril, sulfa antibiotics, metformin, and penicillins.  MEDICATIONS:  Current Outpatient Medications  Medication Sig Dispense Refill  . allopurinol (ZYLOPRIM) 100 MG tablet Take 50 mg by mouth daily.    .   aspirin EC 81 MG tablet Take 81 mg by mouth at bedtime.     . chlorpheniramine-HYDROcodone (TUSSIONEX) 10-8 MG/5ML SUER Take 5 mLs by mouth every 12 (twelve) hours as needed for  cough. 140 mL 0  . colchicine 0.6 MG tablet Take 0.6 mg by mouth 2 (two) times daily.    . dexamethasone (DECADRON) 4 MG tablet Take 5 tablets (20 mg total) by mouth daily. Take the day after Velcade on days 2,5,9,12. Take with breakfast 40 tablet 3  . docusate sodium (COLACE) 100 MG capsule Take 1 capsule (100 mg total) by mouth 2 (two) times daily. 10 capsule 0  . Ensure Max Protein (ENSURE MAX PROTEIN) LIQD Take 330 mLs (11 oz total) by mouth 2 (two) times daily between meals.    . ferrous sulfate 325 (65 FE) MG tablet Take 1 tablet (325 mg total) by mouth 2 (two) times daily with a meal. (Patient not taking: Reported on 05/23/2020)  3  . fexofenadine (ALLEGRA) 180 MG tablet Take 180 mg by mouth daily.    . FLUoxetine (PROZAC) 10 MG capsule Take 10 mg by mouth daily.    . folic acid (FOLVITE) 1 MG tablet folic acid 1 mg tablet    . HYDROcodone-acetaminophen (NORCO/VICODIN) 5-325 MG tablet Take 1-2 tablets by mouth every 4 (four) hours as needed for moderate pain. 30 tablet 0  . hydrocortisone (ANUSOL-HC) 2.5 % rectal cream Apply 1 application topically 2 (two) times daily.    . LANTUS SOLOSTAR 100 UNIT/ML Solostar Pen Inject 16 Units into the skin at bedtime.    . lenalidomide (REVLIMID) 10 MG capsule Take 1 capsule (10 mg total) by mouth daily. Take 14 days on, 7 days off, repeat every 21 days (Patient not taking: Reported on 05/23/2020) 14 capsule 0  . levothyroxine (SYNTHROID) 50 MCG tablet Take 1 tablet (50 mcg total) by mouth daily before breakfast. On Tuesday, Thursday, Saturday and Sunday    . levothyroxine (SYNTHROID) 75 MCG tablet Take 1 tablet (75 mcg total) by mouth daily before breakfast. On Monday, Wednesday and Friday    . lidocaine (LIDODERM) 5 % Place 1 patch onto the skin daily. Remove & Discard patch within 12 hours or as directed by MD (Patient not taking: Reported on 05/23/2020) 30 patch 0  . magnesium oxide (MAG-OX) 400 MG tablet Take 200 mg by mouth daily.    . mexiletine  (MEXITIL) 200 MG capsule Take 1 capsule (200 mg total) by mouth every 12 (twelve) hours. 60 capsule 1  . midodrine (PROAMATINE) 10 MG tablet Take 1 tablet (10 mg total) by mouth 2 (two) times daily with a meal.    . nystatin cream (MYCOSTATIN) Apply 1 application topically 2 (two) times daily. (Patient not taking: Reported on 05/23/2020)    . ondansetron (ZOFRAN) 8 MG tablet Take 1 tablet (8 mg total) by mouth 2 (two) times daily as needed (Nausea or vomiting). 60 tablet 2  . OXYGEN Inhale 2 L into the lungs continuous.    . pantoprazole (PROTONIX) 40 MG tablet Take 40 mg by mouth daily.     . polyethylene glycol (MIRALAX / GLYCOLAX) 17 g packet Take 17 g by mouth daily. 14 each 0  . potassium chloride SA (KLOR-CON) 20 MEQ tablet Take 20 mEq by mouth daily.     . Prenatal Vit-Fe Fumarate-FA (PRENATAL MULTIVITAMIN) TABS tablet Take 1 tablet by mouth daily.    . prochlorperazine (COMPAZINE) 10 MG tablet Take 1 tablet (10 mg total) by mouth   every 6 (six) hours as needed (Nausea or vomiting). 60 tablet 2  . simvastatin (ZOCOR) 40 MG tablet Take 20 mg by mouth at bedtime.   2  . spironolactone (ALDACTONE) 25 MG tablet Take 25 mg by mouth daily.     . sucralfate (CARAFATE) 1 g tablet Take 1 tablet by mouth 3 (three) times daily before meals.    . torsemide (DEMADEX) 20 MG tablet Take 2 tablets (40 mg total) by mouth daily. Future refills need to come from kidney doctor 60 tablet 1  . traMADol (ULTRAM) 50 MG tablet Take 1 tablet (50 mg total) by mouth every 6 (six) hours as needed for moderate pain. 30 tablet 0  . traZODone (DESYREL) 50 MG tablet Take 50 mg by mouth at bedtime.     No current facility-administered medications for this visit.    VITAL SIGNS: There were no vitals taken for this visit. There were no vitals filed for this visit.  Estimated body mass index is 28.22 kg/m as calculated from the following:   Height as of 03/29/20: 5' 4" (1.626 m).   Weight as of an earlier encounter on  05/23/20: 74.6 kg (164 lb 6.4 oz).  LABS: CBC:    Component Value Date/Time   WBC 6.6 05/23/2020 1046   HGB 11.2 (L) 05/23/2020 1046   HGB 10.0 (L) 07/01/2017 1213   HCT 34.5 (L) 05/23/2020 1046   HCT 31.1 (L) 07/01/2017 1213   PLT 257 05/23/2020 1046   PLT 299 07/01/2017 1213   MCV 97.2 05/23/2020 1046   MCV 87 07/01/2017 1213   NEUTROABS 3.9 05/23/2020 1046   NEUTROABS 4.7 07/01/2017 1213   LYMPHSABS 1.9 05/23/2020 1046   LYMPHSABS 2.3 07/01/2017 1213   MONOABS 0.7 05/23/2020 1046   EOSABS 0.1 05/23/2020 1046   EOSABS 0.1 07/01/2017 1213   BASOSABS 0.1 05/23/2020 1046   BASOSABS 0.0 07/01/2017 1213   Comprehensive Metabolic Panel:    Component Value Date/Time   NA 135 05/23/2020 1046   NA 140 03/31/2017 0940   K 5.0 05/23/2020 1046   CL 93 (L) 05/23/2020 1046   CO2 29 05/23/2020 1046   BUN 41 (H) 05/23/2020 1046   BUN 31 (H) 03/31/2017 0940   CREATININE 1.77 (H) 05/23/2020 1046   GLUCOSE 111 (H) 05/23/2020 1046   CALCIUM 9.4 05/23/2020 1046   CALCIUM 8.6 (L) 09/07/2019 0951   AST 23 05/23/2020 1046   ALT 15 05/23/2020 1046   ALKPHOS 88 05/23/2020 1046   BILITOT 0.5 05/23/2020 1046   PROT 7.1 05/23/2020 1046   ALBUMIN 4.2 05/23/2020 1046    RADIOGRAPHIC STUDIES: No results found.   Assessment and Plan-  Restart Revlimid today 05/24/20 along with her Velcade.   Patient Education I spoke with patient and her daughter for a repeat overview of new oral chemotherapy medication, Revlimid (lenalidomide).  Pt is doing well. Counseled patient on administration, dosing, side effects, monitoring, drug-food interactions, safe handling, storage, and disposal. Patient will take 1 capsule (10 mg total) by mouth daily. Take 14 days on, 7 days off, repeat every 21 days.  Side effects include but not limited to: rash, nausea, fatigue, diarrhea or constipation.  Patient did not have an issues with her Revlimid during the first cycle.  Reviewed with patient importance of  keeping a medication schedule and plan for any missed doses.  After discussion with patient no patient barriers to medication adherence identified. Her daughter help with her medication management.  Ms. Bozzo voiced understanding   and appreciation. All questions answered. Medication calendar with Revlimid and dexamethasone provided.  Provided patient with [facility] phone number. Patient knows to call the office with questions or concerns. Oral Chemotherapy Navigation Clinic will continue to follow.  Medication Access Issues: none, patient has medication on hand to begin this cycle. She will need to get reactivated with the pharmacy when it is time for her next cycle.  Patient expressed understanding and was in agreement with this plan. She also understands that She can call clinic at any time with any questions, concerns, or complaints.   Thank you for allowing me to participate in the care of this very pleasant patient.   Time Total: 15 mins  Visit consisted of counseling and education on dealing with issues of symptom management in the setting of serious and potentially life-threatening illness.Greater than 50%  of this time was spent counseling and coordinating care related to the above assessment and plan.  Signed by: Darl Pikes, PharmD, BCPS, Salley Slaughter, CPP Hematology/Oncology Clinical Pharmacist Practitioner [facility] [phone]  05/23/2020 4:45 PM

## 2020-05-23 NOTE — Progress Notes (Signed)
Patient here for revaluation of treatment plan. She is experiencing pain in her left hip that responds to medication, appt diminished she has lost 10 pounds since her last visit.

## 2020-05-23 NOTE — Progress Notes (Signed)
Creatinine 1.77, Per Dr, Grayland Ormond okay to proceed with Velcade treatment.   1245: Pt tolerated treatment well. No s/s of distress or reaction noted. Pt stable at discharge.

## 2020-05-26 ENCOUNTER — Inpatient Hospital Stay: Payer: Medicare Other

## 2020-05-26 VITALS — BP 98/47 | HR 88 | Temp 98.1°F | Resp 17

## 2020-05-26 DIAGNOSIS — Z5111 Encounter for antineoplastic chemotherapy: Secondary | ICD-10-CM | POA: Diagnosis not present

## 2020-05-26 DIAGNOSIS — C9000 Multiple myeloma not having achieved remission: Secondary | ICD-10-CM

## 2020-05-26 MED ORDER — BORTEZOMIB CHEMO SQ INJECTION 3.5 MG (2.5MG/ML)
1.3000 mg/m2 | Freq: Once | INTRAMUSCULAR | Status: AC
  Administered 2020-05-26: 2.5 mg via SUBCUTANEOUS
  Filled 2020-05-26: qty 1

## 2020-05-26 MED ORDER — DEXAMETHASONE 4 MG PO TABS
20.0000 mg | ORAL_TABLET | Freq: Once | ORAL | Status: AC
  Administered 2020-05-26: 20 mg via ORAL
  Filled 2020-05-26: qty 5

## 2020-05-26 NOTE — Progress Notes (Signed)
Pt tolerated treatment well. Pt stable at discharge.

## 2020-05-27 NOTE — Progress Notes (Deleted)
[place]  [phone] [phone]) [phone]  ID: Heather Peterson [place] OB: [birth year]  MR#: [id number]  KGU#:[id number]  [name], MD as PCP - General (Family Medicine) Lloyd Huger, MD as Consulting Physician (Oncology)  CHIEF COMPLAINT: Multiple myeloma.  INTERVAL HISTORY: Patient returns to clinic today for further evaluation and reinitiation of Velcade and Revlimid.  She continues to have hip pain from her fracture, but her mobility and weakness and fatigue are improving.  She has chronic shortness of breath and requires oxygen 24 hours/day. She has no neurologic complaints.  She denies any recent fevers.  She has a fair appetite.  She denies any chest pain, cough, or hemoptysis.  She denies any nausea, vomiting, or constipation.  She has no melena or hematochezia.  She has no urinary complaints.  Patient offers no further specific complaints today.  REVIEW OF SYSTEMS:   Review of Systems  Constitutional: Positive for malaise/fatigue. Negative for fever.  Respiratory: Positive for shortness of breath. Negative for cough and hemoptysis.   Cardiovascular: Negative.  Negative for chest pain and leg swelling.  Gastrointestinal: Negative.  Negative for abdominal pain, diarrhea, melena and nausea.  Genitourinary: Negative.  Negative for dysuria and hematuria.  Musculoskeletal: Positive for joint pain. Negative for back pain.  Skin: Negative.  Negative for rash.  Neurological: Positive for weakness. Negative for dizziness, focal weakness and headaches.  Psychiatric/Behavioral: Negative.  The patient is not nervous/anxious.     As per HPI. Otherwise, a complete review of systems is negative.  PAST MEDICAL HISTORY: Past Medical History:  Diagnosis Date  . Anxiety   . Chronic combined systolic and diastolic CHF (congestive heart failure) ([place])   . Chronic kidney disease   . Coronary artery disease   . Depression   .  Diabetes mellitus without complication ([place])   . Diabetes mellitus, type II ([place])   . Hypertension   . MI (myocardial infarction) ([place])    x 5  . Pacemaker   . Prolonged Q-T interval on ECG   . Thyroid disease     PAST SURGICAL HISTORY: Past Surgical History:  Procedure Laterality Date  . CENTRAL LINE INSERTION  03/11/2017   Procedure: CENTRAL LINE INSERTION;  Surgeon: Leonie Man, MD;  Location: [facility];  Service: Cardiovascular;;  . CHOLECYSTECTOMY    . COLONOSCOPY WITH PROPOFOL N/A 09/01/2019   Procedure: COLONOSCOPY WITH PROPOFOL;  Surgeon: Toledo, Benay Pike, MD;  Location: [facility];  Service: Gastroenterology;  Laterality: N/A;  . CORONARY STENT INTERVENTION W/IMPELLA N/A 03/11/2017   Procedure: Coronary Stent Intervention w/Impella;  Surgeon: Leonie Man, MD;  Location: [facility];  Service: Cardiovascular;  Laterality: N/A;  . ESOPHAGOGASTRODUODENOSCOPY (EGD) WITH PROPOFOL N/A 09/01/2019   Procedure: ESOPHAGOGASTRODUODENOSCOPY (EGD) WITH PROPOFOL;  Surgeon: Toledo, Benay Pike, MD;  Location: [facility];  Service: Gastroenterology;  Laterality: N/A;  . ESOPHAGOGASTRODUODENOSCOPY (EGD) WITH PROPOFOL N/A 09/08/2019   Procedure: ESOPHAGOGASTRODUODENOSCOPY (EGD) WITH PROPOFOL;  Surgeon: [name], MD;  Location: [facility];  Service: Gastroenterology;  Laterality: N/A;  . EYE SURGERY    . HIP ARTHROPLASTY Right 03/29/2020   Procedure: ARTHROPLASTY BIPOLAR HIP (HEMIARTHROPLASTY);  Surgeon: Corky Mull, MD;  Location: [facility];  Service: Orthopedics;  Laterality: Right;  . INTRAVASCULAR PRESSURE WIRE/FFR STUDY N/A 03/11/2017   Procedure: INTRAVASCULAR PRESSURE WIRE/FFR STUDY;  Surgeon: Leonie Man, MD;  Location: [place] [facility];  Service: Cardiovascular;  Laterality: N/A;  . INTRAVASCULAR ULTRASOUND/IVUS N/A  03/11/2017   Procedure: Intravascular Ultrasound/IVUS;  Surgeon: Leonie Man, MD;  Location: [place] [facility];  Service:  Cardiovascular;  Laterality: N/A;  . LEFT HEART CATH AND CORONARY ANGIOGRAPHY N/A 03/05/2017   Procedure: LEFT HEART CATH AND CORONARY ANGIOGRAPHY;  Surgeon: Isaias Cowman, MD;  Location: [place] [facility];  Service: Cardiovascular;  Laterality: N/A;  . PACEMAKER IMPLANT    . pacemaker/defibrillator Left     FAMILY HISTORY: Family History  Problem Relation Age of Onset  . Hypertension Father   . Heart attack Father   . Depression Sister   . Depression Brother   . Depression Brother     ADVANCED DIRECTIVES (Y/N):  N  HEALTH MAINTENANCE: Social History   Tobacco Use  . Smoking status: Current Every Day Smoker    Packs/day: 0.25    Types: E-cigarettes, Cigarettes  . Smokeless tobacco: Never Used  Vaping Use  . Vaping Use: Former  Substance Use Topics  . Alcohol use: Not Currently    Comment: occasionally  . Drug use: No     Colonoscopy:  PAP:  Bone density:  Lipid panel:  Allergies  Allergen Reactions  . Celebrex [Celecoxib] Anaphylaxis  . Glipizide Anaphylaxis  . Levaquin [Levofloxacin In D5w] Other (See Comments)    Heart arrhthymias  . Levofloxacin Other (See Comments) and Palpitations    ICD fired  . Lisinopril Swelling    Lip and facial swelling  . Sulfa Antibiotics Other (See Comments) and Anaphylaxis    Reaction: unknown  . Metformin Other (See Comments)    Gi tolerance   . Penicillins Rash and Other (See Comments)    Has patient had a PCN reaction causing immediate rash, facial/tongue/throat swelling, SOB or lightheadedness with hypotension: Unknown Has patient had a PCN reaction causing severe rash involving mucus membranes or skin necrosis: No Has patient had a PCN reaction that required hospitalization: No Has patient had a PCN reaction occurring within the last 10 years: No If all of the above answers are "NO", then may proceed with Cephalosporin use.     Current Outpatient Medications  Medication Sig Dispense Refill  . allopurinol  (ZYLOPRIM) 100 MG tablet Take 50 mg by mouth daily.    Marland Kitchen aspirin EC 81 MG tablet Take 81 mg by mouth at bedtime.     . chlorpheniramine-HYDROcodone (TUSSIONEX) 10-8 MG/5ML SUER Take 5 mLs by mouth every 12 (twelve) hours as needed for cough. 140 mL 0  . colchicine 0.6 MG tablet Take 0.6 mg by mouth 2 (two) times daily.    Marland Kitchen dexamethasone (DECADRON) 4 MG tablet Take 5 tablets (20 mg total) by mouth daily. Take the day after Velcade on days 2,5,9,12. Take with breakfast 40 tablet 3  . docusate sodium (COLACE) 100 MG capsule Take 1 capsule (100 mg total) by mouth 2 (two) times daily. 10 capsule 0  . Ensure Max Protein (ENSURE MAX PROTEIN) LIQD Take 330 mLs (11 oz total) by mouth 2 (two) times daily between meals.    . ferrous sulfate 325 (65 FE) MG tablet Take 1 tablet (325 mg total) by mouth 2 (two) times daily with a meal. (Patient not taking: Reported on 05/23/2020)  3  . fexofenadine (ALLEGRA) 180 MG tablet Take 180 mg by mouth daily.    Marland Kitchen FLUoxetine (PROZAC) 10 MG capsule Take 10 mg by mouth daily.    . folic acid (FOLVITE) 1 MG tablet folic acid 1 mg tablet    . HYDROcodone-acetaminophen (NORCO/VICODIN) 5-325 MG tablet  Take 1-2 tablets by mouth every 4 (four) hours as needed for moderate pain. 30 tablet 0  . hydrocortisone (ANUSOL-HC) 2.5 % rectal cream Apply 1 application topically 2 (two) times daily.    Marland Kitchen LANTUS SOLOSTAR 100 UNIT/ML Solostar Pen Inject 16 Units into the skin at bedtime.    Marland Kitchen lenalidomide (REVLIMID) 10 MG capsule Take 1 capsule (10 mg total) by mouth daily. Take 14 days on, 7 days off, repeat every 21 days (Patient not taking: Reported on 05/23/2020) 14 capsule 0  . levothyroxine (SYNTHROID) 50 MCG tablet Take 1 tablet (50 mcg total) by mouth daily before breakfast. On Tuesday, Thursday, Saturday and Sunday    . levothyroxine (SYNTHROID) 75 MCG tablet Take 1 tablet (75 mcg total) by mouth daily before breakfast. On Monday, Wednesday and Friday    . lidocaine (LIDODERM) 5 % Place  1 patch onto the skin daily. Remove & Discard patch within 12 hours or as directed by MD (Patient not taking: Reported on 05/23/2020) 30 patch 0  . magnesium oxide (MAG-OX) 400 MG tablet Take 200 mg by mouth daily.    Marland Kitchen mexiletine (MEXITIL) 200 MG capsule Take 1 capsule (200 mg total) by mouth every 12 (twelve) hours. 60 capsule 1  . midodrine (PROAMATINE) 10 MG tablet Take 1 tablet (10 mg total) by mouth 2 (two) times daily with a meal.    . nystatin cream (MYCOSTATIN) Apply 1 application topically 2 (two) times daily. (Patient not taking: Reported on 05/23/2020)    . ondansetron (ZOFRAN) 8 MG tablet Take 1 tablet (8 mg total) by mouth 2 (two) times daily as needed (Nausea or vomiting). 60 tablet 2  . OXYGEN Inhale 2 L into the lungs continuous.    . pantoprazole (PROTONIX) 40 MG tablet Take 40 mg by mouth daily.     . polyethylene glycol (MIRALAX / GLYCOLAX) 17 g packet Take 17 g by mouth daily. 14 each 0  . potassium chloride SA (KLOR-CON) 20 MEQ tablet Take 20 mEq by mouth daily.     . Prenatal Vit-Fe Fumarate-FA (PRENATAL MULTIVITAMIN) TABS tablet Take 1 tablet by mouth daily.    . prochlorperazine (COMPAZINE) 10 MG tablet Take 1 tablet (10 mg total) by mouth every 6 (six) hours as needed (Nausea or vomiting). 60 tablet 2  . simvastatin (ZOCOR) 40 MG tablet Take 20 mg by mouth at bedtime.   2  . spironolactone (ALDACTONE) 25 MG tablet Take 25 mg by mouth daily.     . sucralfate (CARAFATE) 1 g tablet Take 1 tablet by mouth 3 (three) times daily before meals.    . torsemide (DEMADEX) 20 MG tablet Take 2 tablets (40 mg total) by mouth daily. Future refills need to come from kidney doctor 60 tablet 1  . traMADol (ULTRAM) 50 MG tablet Take 1 tablet (50 mg total) by mouth every 6 (six) hours as needed for moderate pain. 30 tablet 0  . traZODone (DESYREL) 50 MG tablet Take 50 mg by mouth at bedtime.     No current facility-administered medications for this visit.    OBJECTIVE: There were no vitals  filed for this visit.   There is no height or weight on file to calculate BMI.    ECOG FS:1 - Symptomatic but completely ambulatory  General: Well-developed, well-nourished, no acute distress.  Sitting in a wheelchair. Eyes: Pink conjunctiva, anicteric sclera. HEENT: Normocephalic, moist mucous membranes. Lungs: No audible wheezing or coughing. Heart: Regular rate and rhythm. Abdomen: Soft, nontender, no obvious  distention. Musculoskeletal: No edema, cyanosis, or clubbing. Neuro: Alert, answering all questions appropriately. Cranial nerves grossly intact. Skin: No rashes or petechiae noted. Psych: Normal affect.    LAB RESULTS:  Lab Results  Component Value Date   NA 135 05/23/2020   K 5.0 05/23/2020   CL 93 (L) 05/23/2020   CO2 29 05/23/2020   GLUCOSE 111 (H) 05/23/2020   BUN 41 (H) 05/23/2020   CREATININE 1.77 (H) 05/23/2020   CALCIUM 9.4 05/23/2020   PROT 7.1 05/23/2020   ALBUMIN 4.2 05/23/2020   AST 23 05/23/2020   ALT 15 05/23/2020   ALKPHOS 88 05/23/2020   BILITOT 0.5 05/23/2020   GFRNONAA 29 (L) 05/23/2020   GFRAA 30 (L) 09/09/2019    Lab Results  Component Value Date   WBC 6.6 05/23/2020   NEUTROABS 3.9 05/23/2020   HGB 11.2 (L) 05/23/2020   HCT 34.5 (L) 05/23/2020   MCV 97.2 05/23/2020   PLT 257 05/23/2020   Lab Results  Component Value Date   IRON 85 05/23/2020   TIBC 421 05/23/2020   IRONPCTSAT 20 05/23/2020   Lab Results  Component Value Date   FERRITIN 50 05/23/2020     STUDIES: No results found.  ASSESSMENT: Multiple myeloma, fractured right hip.  PLAN:    1.  Multiple myeloma: Bone marrow biopsy confirmed diagnosis with plasma cells up to 90% of biopsy.  Cytogenetics are reported as normal.  Metastatic bone survey from March 03, 2020 reviewed independently and report as above with multiple skeletal lucencies consistent with myeloma.  Her most recent M spike is only mildly elevated at 1.2, but she has a significantly elevated IgA level  of 2773.  Kappa and lambda free light chains are within normal limits.  Patient noted to have endorgan damage of renal insufficiency as well as persistent anemia.  She will benefit from Velcade on days 1, 4, 8, and 11 along with 10 mg Revlimid on days 1 through 14.  Revlimid has been dose reduced secondary to renal insufficiency.  Patient will also receive weekly 20 mg dexamethasone.  Patient only completed cycle 1 of treatment prior to her right hip fracture.  Proceed with cycle 2, day 1 of treatment today.  Return to clinic on Friday for cycle 2, day 4 and then in 1 week for further evaluation and consideration of cycle 2, day 8.  Appreciate clinical pharmacy input. 2.  Anemia: Improved.  Iron stores are within normal limits.  Likely multifactorial with chronic renal insufficiency, underlying myeloma, as well as history of recent GI bleed.  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  Monitor. 3.  Chronic renal insufficiency: Creatinine is trended up to 1.77.  Continue with dose reduced Revlimid as above.  Velcade does not need to be dosed reduced and renal dysfunction.  Monitor.   4.  Ascites: Patient does not complain of this today.  Continue follow-up with heart failure clinic as scheduled. 5.  Angiodysplastic lesions: Continue follow-up with GI as scheduled. 6.  Diarrhea: Patient does not complain of this today.  Continue Lomotil as needed. 7.  Right hip fracture: Improving.  Continue rehab and follow-up with orthopedics as scheduled.  I spent a total of 30 minutes reviewing chart data, face-to-face evaluation with the patient, counseling and coordination of care as detailed above.    Patient expressed understanding and was in agreement with this plan. She also understands that She can call clinic at any time with any questions,  concerns, or complaints.    Lloyd Huger, MD   05/27/2020 7:53 AM

## 2020-05-30 ENCOUNTER — Inpatient Hospital Stay: Payer: Medicare Other

## 2020-05-30 ENCOUNTER — Inpatient Hospital Stay: Payer: Medicare Other | Admitting: Oncology

## 2020-05-30 ENCOUNTER — Telehealth: Payer: Self-pay | Admitting: *Deleted

## 2020-05-30 ENCOUNTER — Other Ambulatory Visit: Payer: Self-pay | Admitting: *Deleted

## 2020-05-30 DIAGNOSIS — C9000 Multiple myeloma not having achieved remission: Secondary | ICD-10-CM

## 2020-05-30 MED ORDER — HYDROCODONE-ACETAMINOPHEN 5-325 MG PO TABS
1.0000 | ORAL_TABLET | ORAL | 0 refills | Status: DC | PRN
Start: 2020-05-30 — End: 2020-06-19

## 2020-05-30 NOTE — Telephone Encounter (Signed)
Daughter called and reported at home covid test done and was negative.  Pt scheduled with appt in symptom management clinic at 1130 am in the morning 06/10/2020.  Daughter verbalized understanding.

## 2020-05-30 NOTE — Telephone Encounter (Signed)
Called and spoke with pt.  She states she is weak, unable to get out of bed, having chills and muscle aches. RN notified pt we could see her in symptom management clinic but she would need to have a covid test prior to coming into clinic.Marland Kitchen Pt verbalized understanding and stated there was no way she could go out to get tested.  Pt stated she would see if daughter could get a home test and would call us with results.  RN instructed pt to go to emergency room or call 911 if her symptoms worsened.  RN will follow up with patient tomorrow.  Pt verbalized understanding of above.

## 2020-05-31 ENCOUNTER — Inpatient Hospital Stay (HOSPITAL_BASED_OUTPATIENT_CLINIC_OR_DEPARTMENT_OTHER): Payer: Medicare Other | Admitting: Oncology

## 2020-05-31 ENCOUNTER — Other Ambulatory Visit: Payer: Self-pay

## 2020-05-31 ENCOUNTER — Ambulatory Visit: Payer: Medicare Other

## 2020-05-31 ENCOUNTER — Inpatient Hospital Stay: Payer: Medicare Other | Admitting: Oncology

## 2020-05-31 ENCOUNTER — Other Ambulatory Visit: Payer: Self-pay | Admitting: Lab

## 2020-05-31 VITALS — BP 98/58 | HR 81 | Temp 98.3°F | Resp 18 | Wt 156.5 lb

## 2020-05-31 DIAGNOSIS — R531 Weakness: Secondary | ICD-10-CM

## 2020-05-31 DIAGNOSIS — J019 Acute sinusitis, unspecified: Secondary | ICD-10-CM

## 2020-05-31 DIAGNOSIS — C9000 Multiple myeloma not having achieved remission: Secondary | ICD-10-CM | POA: Diagnosis not present

## 2020-05-31 DIAGNOSIS — Z5111 Encounter for antineoplastic chemotherapy: Secondary | ICD-10-CM | POA: Diagnosis not present

## 2020-05-31 LAB — CBC WITH DIFFERENTIAL/PLATELET
Abs Immature Granulocytes: 0.16 10*3/uL — ABNORMAL HIGH (ref 0.00–0.07)
Basophils Absolute: 0 10*3/uL (ref 0.0–0.1)
Basophils Relative: 0 %
Eosinophils Absolute: 0.1 10*3/uL (ref 0.0–0.5)
Eosinophils Relative: 2 %
HCT: 27.3 % — ABNORMAL LOW (ref 36.0–46.0)
Hemoglobin: 9.1 g/dL — ABNORMAL LOW (ref 12.0–15.0)
Immature Granulocytes: 2 %
Lymphocytes Relative: 9 %
Lymphs Abs: 0.7 10*3/uL (ref 0.7–4.0)
MCH: 31.9 pg (ref 26.0–34.0)
MCHC: 33.3 g/dL (ref 30.0–36.0)
MCV: 95.8 fL (ref 80.0–100.0)
Monocytes Absolute: 0.8 10*3/uL (ref 0.1–1.0)
Monocytes Relative: 10 %
Neutro Abs: 5.8 10*3/uL (ref 1.7–7.7)
Neutrophils Relative %: 77 %
Platelets: 142 10*3/uL — ABNORMAL LOW (ref 150–400)
RBC: 2.85 MIL/uL — ABNORMAL LOW (ref 3.87–5.11)
RDW: 16.6 % — ABNORMAL HIGH (ref 11.5–15.5)
WBC: 7.6 10*3/uL (ref 4.0–10.5)
nRBC: 0 % (ref 0.0–0.2)

## 2020-05-31 LAB — COMPREHENSIVE METABOLIC PANEL
ALT: 27 U/L (ref 0–44)
AST: 25 U/L (ref 15–41)
Albumin: 3.8 g/dL (ref 3.5–5.0)
Alkaline Phosphatase: 73 U/L (ref 38–126)
Anion gap: 12 (ref 5–15)
BUN: 56 mg/dL — ABNORMAL HIGH (ref 8–23)
CO2: 26 mmol/L (ref 22–32)
Calcium: 8.9 mg/dL (ref 8.9–10.3)
Chloride: 92 mmol/L — ABNORMAL LOW (ref 98–111)
Creatinine, Ser: 1.64 mg/dL — ABNORMAL HIGH (ref 0.44–1.00)
GFR, Estimated: 32 mL/min — ABNORMAL LOW (ref >60)
Glucose, Bld: 107 mg/dL — ABNORMAL HIGH (ref 70–99)
Potassium: 4.1 mmol/L (ref 3.5–5.1)
Sodium: 130 mmol/L — ABNORMAL LOW (ref 135–145)
Total Bilirubin: 0.3 mg/dL (ref 0.3–1.2)
Total Protein: 6.7 g/dL (ref 6.5–8.1)

## 2020-05-31 LAB — MAGNESIUM: Magnesium: 2.8 mg/dL — ABNORMAL HIGH (ref 1.7–2.4)

## 2020-05-31 MED ORDER — DEXAMETHASONE SODIUM PHOSPHATE 10 MG/ML IJ SOLN
10.0000 mg | Freq: Once | INTRAMUSCULAR | Status: AC
  Administered 2020-05-31: 10 mg via INTRAVENOUS

## 2020-05-31 MED ORDER — PREDNISONE 20 MG PO TABS: 20.0000 mg | ORAL_TABLET | Freq: Every day | ORAL | Status: DC

## 2020-05-31 MED ORDER — SODIUM CHLORIDE 0.9 % IV SOLN
INTRAVENOUS | Status: DC
  Filled 2020-05-31 (×2): qty 250

## 2020-05-31 MED ORDER — DOXYCYCLINE HYCLATE 100 MG PO TABS: 100.0000 mg | ORAL_TABLET | Freq: Two times a day (BID) | ORAL | 0 refills | Status: DC

## 2020-05-31 MED ORDER — DEXAMETHASONE SODIUM PHOSPHATE 10 MG/ML IJ SOLN
INTRAMUSCULAR | Status: AC
  Filled 2020-05-31: qty 1

## 2020-05-31 NOTE — Progress Notes (Signed)
Symptom Management Consult note [facility]  [phone] [phone]  [name], MD as PCP - General (Family Medicine) Lloyd Huger, MD as Consulting Physician (Oncology)   Name of the patient: Heather Peterson  [id number]  1941-03-25   Date of visit: 05/31/2020   Diagnosis-multiple myeloma  Chief complaint/ Reason for visit-weakness, muscle aches and chills  Heme/Onc history: Heather Peterson is a 80 year old female with past medical history significant for CAD, hypertension, CHF, ventricular tachycardia, cardiomyopathy, myocardial infarction, COPD, diabetes type 2, hip fracture, hyperlipidemia, former tobacco user, anemia and multiple myeloma who is being treated by Dr. Grayland Ormond with Velcade and Revlimid.  She started her first round of RVD on 03/13/2020.  She was hospitalized from 03/28/2020-04/03/20 after she sustained a fall requiring a right hip replacement on 03/29/2020.  She was discharged to SNF for rehab.  Cycle 2 of RVD was delayed secondary to hip fracture and rehab.  Cycle 2 was started on 05/23/2020.  Interval history-today, she presents with profound weakness.  Symptoms started approximately 7 days ago.  She started cycle 2 of RVD on 05/23/2020 which is approximately when her symptoms started.  She was able to complete a home COVID-19 test prior to her visit today.  It was negative.  Associated symptoms include sore throat, left ear pressure, muscle aches, chills, weakness and extreme fatigue.  Denies a fever.  Her daughter recently was treated for an upper respiratory infection with antibiotics.  She reportedly was negative for COVID.  Reports some mild nausea.  Denies any abdominal pain, constipation or diarrhea.  Appetite has been fair.  ECOG FS:3 - Symptomatic, >50% confined to bed  Review of systems- Review of Systems  Constitutional: Positive for malaise/fatigue. Negative for chills,  fever and weight loss.  HENT: Positive for ear pain, sinus pain and sore throat. Negative for congestion and tinnitus.   Eyes: Negative.  Negative for blurred vision and double vision.  Respiratory: Positive for cough and shortness of breath (Chronic). Negative for sputum production.   Cardiovascular: Negative.  Negative for chest pain, palpitations and leg swelling.  Gastrointestinal: Negative.  Negative for abdominal pain, constipation, diarrhea, nausea and vomiting.  Genitourinary: Negative for dysuria, frequency and urgency.  Musculoskeletal: Negative for back pain and falls.  Skin: Negative.  Negative for rash.  Neurological: Negative.  Negative for weakness and headaches.  Endo/Heme/Allergies: Negative.  Does not bruise/bleed easily.  Psychiatric/Behavioral: Negative.  Negative for depression. The patient is not nervous/anxious and does not have insomnia.      Current treatment- S/p cycle 2 RVD  Allergies  Allergen Reactions  . Celebrex [Celecoxib] Anaphylaxis  . Glipizide Anaphylaxis  . Levaquin [Levofloxacin In D5w] Other (See Comments)    Heart arrhthymias  . Levofloxacin Other (See Comments) and Palpitations    ICD fired  . Lisinopril Swelling    Lip and facial swelling  . Sulfa Antibiotics Other (See Comments) and Anaphylaxis    Reaction: unknown  . Metformin Other (See Comments)    Gi tolerance   . Penicillins Rash and Other (See Comments)    Has patient had a PCN reaction causing immediate rash, facial/tongue/throat swelling, SOB or lightheadedness with hypotension: Unknown Has patient had a PCN reaction causing severe rash involving mucus membranes or skin necrosis: No Has patient had a PCN reaction that required hospitalization: No Has patient had a PCN reaction occurring within the last 10 years: No If all of the above answers are "NO",  then may proceed with Cephalosporin use.      Past Medical History:  Diagnosis Date  . Anxiety   . Chronic combined  systolic and diastolic CHF (congestive heart failure) ([place])   . Chronic kidney disease   . Coronary artery disease   . Depression   . Diabetes mellitus without complication ([place])   . Diabetes mellitus, type II ([place])   . Hypertension   . MI (myocardial infarction) ([place])    x 5  . Pacemaker   . Prolonged Q-T interval on ECG   . Thyroid disease      Past Surgical History:  Procedure Laterality Date  . CENTRAL LINE INSERTION  03/11/2017   Procedure: CENTRAL LINE INSERTION;  Surgeon: Leonie Man, MD;  Location: [place] [facility];  Service: Cardiovascular;;  . CHOLECYSTECTOMY    . COLONOSCOPY WITH PROPOFOL N/A 09/01/2019   Procedure: COLONOSCOPY WITH PROPOFOL;  Surgeon: Toledo, Benay Pike, MD;  Location: [facility] ENDOSCOPY;  Service: Gastroenterology;  Laterality: N/A;  . CORONARY STENT INTERVENTION W/IMPELLA N/A 03/11/2017   Procedure: Coronary Stent Intervention w/Impella;  Surgeon: Leonie Man, MD;  Location: [place] [facility];  Service: Cardiovascular;  Laterality: N/A;  . ESOPHAGOGASTRODUODENOSCOPY (EGD) WITH PROPOFOL N/A 09/01/2019   Procedure: ESOPHAGOGASTRODUODENOSCOPY (EGD) WITH PROPOFOL;  Surgeon: Toledo, Benay Pike, MD;  Location: [facility] ENDOSCOPY;  Service: Gastroenterology;  Laterality: N/A;  . ESOPHAGOGASTRODUODENOSCOPY (EGD) WITH PROPOFOL N/A 09/08/2019   Procedure: ESOPHAGOGASTRODUODENOSCOPY (EGD) WITH PROPOFOL;  Surgeon: [name], MD;  Location: [facility] ENDOSCOPY;  Service: Gastroenterology;  Laterality: N/A;  . EYE SURGERY    . HIP ARTHROPLASTY Right 03/29/2020   Procedure: ARTHROPLASTY BIPOLAR HIP (HEMIARTHROPLASTY);  Surgeon: Corky Mull, MD;  Location: [facility] ORS;  Service: Orthopedics;  Laterality: Right;  . INTRAVASCULAR PRESSURE WIRE/FFR STUDY N/A 03/11/2017   Procedure: INTRAVASCULAR PRESSURE WIRE/FFR STUDY;  Surgeon: Leonie Man, MD;  Location: [place] [facility];  Service: Cardiovascular;  Laterality: N/A;  . INTRAVASCULAR ULTRASOUND/IVUS N/A 03/11/2017    Procedure: Intravascular Ultrasound/IVUS;  Surgeon: Leonie Man, MD;  Location: [place] [facility];  Service: Cardiovascular;  Laterality: N/A;  . LEFT HEART CATH AND CORONARY ANGIOGRAPHY N/A 03/05/2017   Procedure: LEFT HEART CATH AND CORONARY ANGIOGRAPHY;  Surgeon: Isaias Cowman, MD;  Location: [place] [facility];  Service: Cardiovascular;  Laterality: N/A;  . PACEMAKER IMPLANT    . pacemaker/defibrillator Left     Social History   Socioeconomic History  . Marital status: Married    Spouse name: rodney  . Number of children: 2  . Years of education: Not on file  . Highest education level: High school graduate  Occupational History  . Not on file  Tobacco Use  . Smoking status: Current Every Day Smoker    Packs/day: 0.25    Types: E-cigarettes, Cigarettes  . Smokeless tobacco: Never Used  Vaping Use  . Vaping Use: Former  Substance and Sexual Activity  . Alcohol use: Not Currently    Comment: occasionally  . Drug use: No  . Sexual activity: Not Currently  Other Topics Concern  . Not on file  Social History Narrative  . Not on file   Social Determinants of Health   Financial Resource Strain: Not on file  Food Insecurity: Not on file  Transportation Needs: Not on file  Physical Activity: Not on file  Stress: Not on file  Social Connections: Not on file  Intimate Partner Violence: Not on file    Family History  Problem Relation  Age of Onset  . Hypertension Father   . Heart attack Father   . Depression Sister   . Depression Brother   . Depression Brother      Current Outpatient Medications:  .  allopurinol (ZYLOPRIM) 100 MG tablet, Take 50 mg by mouth daily., Disp: , Rfl:  .  aspirin EC 81 MG tablet, Take 81 mg by mouth at bedtime. , Disp: , Rfl:  .  colchicine 0.6 MG tablet, Take 0.6 mg by mouth 2 (two) times daily., Disp: , Rfl:  .  dexamethasone (DECADRON) 4 MG tablet, Take 5 tablets (20 mg total) by mouth daily. Take the day after Velcade on  days 2,5,9,12. Take with breakfast, Disp: 40 tablet, Rfl: 3 .  docusate sodium (COLACE) 100 MG capsule, Take 1 capsule (100 mg total) by mouth 2 (two) times daily., Disp: 10 capsule, Rfl: 0 .  doxycycline (VIBRA-TABS) 100 MG tablet, Take 1 tablet (100 mg total) by mouth 2 (two) times daily., Disp: 10 tablet, Rfl: 0 .  Ensure Max Protein (ENSURE MAX PROTEIN) LIQD, Take 330 mLs (11 oz total) by mouth 2 (two) times daily between meals., Disp: , Rfl:  .  fexofenadine (ALLEGRA) 180 MG tablet, Take 180 mg by mouth daily., Disp: , Rfl:  .  FLUoxetine (PROZAC) 10 MG capsule, Take 10 mg by mouth daily., Disp: , Rfl:  .  folic acid (FOLVITE) 1 MG tablet, folic acid 1 mg tablet, Disp: , Rfl:  .  HYDROcodone-acetaminophen (NORCO/VICODIN) 5-325 MG tablet, Take 1-2 tablets by mouth every 4 (four) hours as needed for moderate pain., Disp: 30 tablet, Rfl: 0 .  hydrocortisone (ANUSOL-HC) 2.5 % rectal cream, Apply 1 application topically 2 (two) times daily., Disp: , Rfl:  .  levothyroxine (SYNTHROID) 50 MCG tablet, Take 1 tablet (50 mcg total) by mouth daily before breakfast. On Tuesday, Thursday, Saturday and Sunday, Disp: , Rfl:  .  levothyroxine (SYNTHROID) 75 MCG tablet, Take 1 tablet (75 mcg total) by mouth daily before breakfast. On Monday, Wednesday and Friday, Disp: , Rfl:  .  magnesium oxide (MAG-OX) 400 MG tablet, Take 200 mg by mouth daily., Disp: , Rfl:  .  mexiletine (MEXITIL) 200 MG capsule, Take 1 capsule (200 mg total) by mouth every 12 (twelve) hours., Disp: 60 capsule, Rfl: 1 .  midodrine (PROAMATINE) 10 MG tablet, Take 1 tablet (10 mg total) by mouth 2 (two) times daily with a meal., Disp: , Rfl:  .  ondansetron (ZOFRAN) 8 MG tablet, Take 1 tablet (8 mg total) by mouth 2 (two) times daily as needed (Nausea or vomiting)., Disp: 60 tablet, Rfl: 2 .  OXYGEN, Inhale 2 L into the lungs continuous., Disp: , Rfl:  .  pantoprazole (PROTONIX) 40 MG tablet, Take 40 mg by mouth daily. , Disp: , Rfl:  .   polyethylene glycol (MIRALAX / GLYCOLAX) 17 g packet, Take 17 g by mouth daily., Disp: 14 each, Rfl: 0 .  potassium chloride SA (KLOR-CON) 20 MEQ tablet, Take 20 mEq by mouth daily. , Disp: , Rfl:  .  predniSONE (STERAPRED UNI-PAK 21 TAB) 10 MG (21) TBPK tablet, Take as directed., Disp: 21 tablet, Rfl: 0 .  Prenatal Vit-Fe Fumarate-FA (PRENATAL MULTIVITAMIN) TABS tablet, Take 1 tablet by mouth daily., Disp: , Rfl:  .  prochlorperazine (COMPAZINE) 10 MG tablet, Take 1 tablet (10 mg total) by mouth every 6 (six) hours as needed (Nausea or vomiting)., Disp: 60 tablet, Rfl: 2 .  simvastatin (ZOCOR) 40 MG tablet, Take   20 mg by mouth at bedtime. , Disp: , Rfl: 2 .  spironolactone (ALDACTONE) 25 MG tablet, Take 25 mg by mouth daily. , Disp: , Rfl:  .  sucralfate (CARAFATE) 1 g tablet, Take 1 tablet by mouth 3 (three) times daily before meals., Disp: , Rfl:  .  torsemide (DEMADEX) 20 MG tablet, Take 2 tablets (40 mg total) by mouth daily. Future refills need to come from kidney doctor, Disp: 60 tablet, Rfl: 1 .  traMADol (ULTRAM) 50 MG tablet, Take 1 tablet (50 mg total) by mouth every 6 (six) hours as needed for moderate pain., Disp: 30 tablet, Rfl: 0 .  traZODone (DESYREL) 50 MG tablet, Take 50 mg by mouth at bedtime., Disp: , Rfl:  .  chlorpheniramine-HYDROcodone (TUSSIONEX) 10-8 MG/5ML SUER, Take 5 mLs by mouth every 12 (twelve) hours as needed for cough. (Patient not taking: Reported on 05/31/2020), Disp: 140 mL, Rfl: 0 .  ferrous sulfate 325 (65 FE) MG tablet, Take 1 tablet (325 mg total) by mouth 2 (two) times daily with a meal. (Patient not taking: No sig reported), Disp: , Rfl: 3 .  LANTUS SOLOSTAR 100 UNIT/ML Solostar Pen, Inject 16 Units into the skin at bedtime. (Patient not taking: Reported on 05/31/2020), Disp: , Rfl:  .  lenalidomide (REVLIMID) 10 MG capsule, Take 1 capsule (10 mg total) by mouth daily. Take 14 days on, 7 days off, repeat every 21 days (Patient not taking: No sig reported), Disp:  14 capsule, Rfl: 0 .  lidocaine (LIDODERM) 5 %, Place 1 patch onto the skin daily. Remove & Discard patch within 12 hours or as directed by MD (Patient not taking: No sig reported), Disp: 30 patch, Rfl: 0 .  nystatin cream (MYCOSTATIN), Apply 1 application topically 2 (two) times daily. (Patient not taking: No sig reported), Disp: , Rfl:   Current Facility-Administered Medications:  .  0.9 %  sodium chloride infusion, , Intravenous, Continuous, Shahana Capes, Wandra Feinstein, NP, Stopped at 05/31/20 1416  Physical exam:  Vitals:   05/31/20 1149  BP: (!) 98/58  Pulse: 81  Resp: 18  Temp: 98.3 F (36.8 C)  TempSrc: Tympanic  SpO2: 99%  Weight: 156 lb 8 oz (71 kg)   Physical Exam Constitutional:      General: Vital signs are normal.     Appearance: Normal appearance.  HENT:     Head: Normocephalic and atraumatic.  Eyes:     Pupils: Pupils are equal, round, and reactive to light.  Cardiovascular:     Rate and Rhythm: Normal rate and regular rhythm.     Heart sounds: Normal heart sounds. No murmur heard.   Pulmonary:     Effort: Pulmonary effort is normal.     Breath sounds: Normal breath sounds. No wheezing.  Abdominal:     General: Bowel sounds are normal. There is no distension.     Palpations: Abdomen is soft.     Tenderness: There is no abdominal tenderness.  Musculoskeletal:        General: No edema. Normal range of motion.     Cervical back: Normal range of motion.  Skin:    General: Skin is warm and dry.     Findings: No rash.  Neurological:     Mental Status: She is alert and oriented to person, place, and time.  Psychiatric:        Judgment: Judgment normal.      CMP Latest Ref Rng & Units 05/31/2020  Glucose 70 - 99 mg/dL  107(H)  BUN 8 - 23 mg/dL 56(H)  Creatinine 0.44 - 1.00 mg/dL 1.64(H)  Sodium 135 - 145 mmol/L 130(L)  Potassium 3.5 - 5.1 mmol/L 4.1  Chloride 98 - 111 mmol/L 92(L)  CO2 22 - 32 mmol/L 26  Calcium 8.9 - 10.3 mg/dL 8.9  Total Protein 6.5 - 8.1  g/dL 6.7  Total Bilirubin 0.3 - 1.2 mg/dL 0.3  Alkaline Phos 38 - 126 U/L 73  AST 15 - 41 U/L 25  ALT 0 - 44 U/L 27   CBC Latest Ref Rng & Units 05/31/2020  WBC 4.0 - 10.5 K/uL 7.6  Hemoglobin 12.0 - 15.0 g/dL 9.1(L)  Hematocrit 36.0 - 46.0 % 27.3(L)  Platelets 150 - 400 K/uL 142(L)    No images are attached to the encounter.  No results found.   Assessment and plan- Patient is a 80 y.o. female who presents to [facility] for concerns of extreme weakness, fatigue, sore throat, sinus pressure and ear pressure.  She denies a fever.  Symptoms have been present for approximately 7 days.  Multiple myeloma: -Status post 2 cycles of RVD. -Recently started back on treatment on 05/23/2020. -Treatment delayed almost 2 months secondary to fall and right hip fracture requiring surgery and SNF rehab.  Weakness/fatigue: - Multifactorial; likely secondary to treatment.  -She also is likely dehydrated given her appetite has declined some since initiating treatment. -Blood pressure is soft. -Lab work shows mild hyponatremia (130), creatinine (1.64), magnesium (2.8) and hemoglobin (9.1). -Revlimid currently on hold X 2 days -We will give IV fluids and IV dexamethasone while in clinic.   Sore throat, sinus pressure and ear pressure: -COVID-19 negative per at home test yesterday. -Possible sinusitis given her daughter was just treated.  -Labs were essentially stable.  White count is normal at 7.6.  Slight drop in hemoglobin 9.1; MCV 95.8; platelet count 142  Plan: -Lab work shows dehydration and anemia -Plan to give 1 L normal saline and dexamethasone. -Continue to hold Revlimid  -RX doxycycline for possible sinusitis -Patient has multiple drug allergies -RX prednisone taper  -RTC in 1 week to reassess.   Visit Diagnosis 1. Weakness   2. Multiple myeloma, remission status unspecified ([place])   3. Acute non-recurrent sinusitis, unspecified location     Patient expressed understanding and was in  agreement with this plan. She also understands that She can call clinic at any time with any questions, concerns, or complaints.   Greater than 50% was spent in counseling and coordination of care with this patient including but not limited to discussion of the relevant topics above (See A&P) including, but not limited to diagnosis and management of acute and chronic medical conditions.   Thank you for allowing me to participate in the care of this very pleasant patient.    Jacquelin Hawking, NP [place] [facility] Cell - [phone] Pager- [phone] 06/01/2020 9:07 AM

## 2020-06-01 ENCOUNTER — Telehealth: Payer: Self-pay | Admitting: Oncology

## 2020-06-01 LAB — PROTEIN ELECTROPHORESIS, SERUM
A/G Ratio: 1.3 (ref 0.7–1.7)
Albumin ELP: 3.4 g/dL (ref 2.9–4.4)
Alpha-1-Globulin: 0.3 g/dL (ref 0.0–0.4)
Alpha-2-Globulin: 1.1 g/dL — ABNORMAL HIGH (ref 0.4–1.0)
Beta Globulin: 1 g/dL (ref 0.7–1.3)
Gamma Globulin: 0.2 g/dL — ABNORMAL LOW (ref 0.4–1.8)
Globulin, Total: 2.6 g/dL (ref 2.2–3.9)
M-Spike, %: 0.1 g/dL — ABNORMAL HIGH
Total Protein ELP: 6 g/dL (ref 6.0–8.5)

## 2020-06-01 LAB — IGG, IGA, IGM
IgA: 302 mg/dL (ref 64–422)
IgG (Immunoglobin G), Serum: 152 mg/dL — ABNORMAL LOW (ref 586–1602)
IgM (Immunoglobulin M), Srm: 5 mg/dL — ABNORMAL LOW (ref 26–217)

## 2020-06-01 LAB — KAPPA/LAMBDA LIGHT CHAINS
Kappa free light chain: 9.5 mg/L (ref 3.3–19.4)
Kappa, lambda light chain ratio: 0.88 (ref 0.26–1.65)
Lambda free light chains: 10.8 mg/L (ref 5.7–26.3)

## 2020-06-01 MED ORDER — PREDNISONE 10 MG (21) PO TBPK: ORAL_TABLET | ORAL | 0 refills | Status: DC

## 2020-06-01 NOTE — Telephone Encounter (Signed)
Called pt to make her aware of her upcoming appts scheduled by the request of Rulon Abide N.P. No answer and mailbox full. Sending AVS via mail.

## 2020-06-02 ENCOUNTER — Inpatient Hospital Stay: Payer: Medicare Other

## 2020-06-03 NOTE — Progress Notes (Signed)
[place]  [phone] [phone]) [phone]  ID: Heather Peterson OB: 12/03/1940  MR#: [id number]  ERX#:[id number]  [name], MD as PCP - General (Family Medicine) Lloyd Huger, MD as Consulting Physician (Oncology)  CHIEF COMPLAINT: Multiple myeloma.  INTERVAL HISTORY: Patient returns to clinic today as an add-on for follow-up after being evaluated in symptom management clinic for extreme fatigue, cough, and worsening shortness of breath. Covid testing was negative. Patient feels much of her symptoms were related to Revlimid, and feels improved since discontinuing treatment. Her hip pain from her nonpathologic fracture is improving.  She has chronic shortness of breath and requires oxygen 24 hours/day. She has no neurologic complaints.  She denies any recent fevers.  She has a fair appetite.  She denies any chest pain, cough, or hemoptysis.  She denies any nausea, vomiting, or constipation.  She has no melena or hematochezia.  She has no urinary complaints. Patient offers no further specific complaints today.  REVIEW OF SYSTEMS:   Review of Systems  Constitutional: Positive for malaise/fatigue. Negative for fever.  Respiratory: Positive for shortness of breath. Negative for cough and hemoptysis.   Cardiovascular: Negative.  Negative for chest pain and leg swelling.  Gastrointestinal: Negative.  Negative for abdominal pain, diarrhea, melena and nausea.  Genitourinary: Negative.  Negative for dysuria and hematuria.  Musculoskeletal: Positive for joint pain. Negative for back pain.  Skin: Negative.  Negative for rash.  Neurological: Positive for weakness. Negative for dizziness, focal weakness and headaches.  Psychiatric/Behavioral: Negative.  The patient is not nervous/anxious.     As per HPI. Otherwise, a complete review of systems is negative.  PAST MEDICAL HISTORY: Past Medical History:  Diagnosis Date  . Anxiety    . Chronic combined systolic and diastolic CHF (congestive heart failure) ([place])   . Chronic kidney disease   . Coronary artery disease   . Depression   . Diabetes mellitus without complication ([place])   . Diabetes mellitus, type II ([place])   . Hypertension   . MI (myocardial infarction) ([place])    x 5  . Pacemaker   . Prolonged Q-T interval on ECG   . Thyroid disease     PAST SURGICAL HISTORY: Past Surgical History:  Procedure Laterality Date  . CENTRAL LINE INSERTION  03/11/2017   Procedure: CENTRAL LINE INSERTION;  Surgeon: Leonie Man, MD;  Location: [facility] [facility];  Service: Cardiovascular;;  . CHOLECYSTECTOMY    . COLONOSCOPY WITH PROPOFOL N/A 09/01/2019   Procedure: COLONOSCOPY WITH PROPOFOL;  Surgeon: Toledo, Benay Pike, MD;  Location: [facility];  Service: Gastroenterology;  Laterality: N/A;  . CORONARY STENT INTERVENTION W/IMPELLA N/A 03/11/2017   Procedure: Coronary Stent Intervention w/Impella;  Surgeon: Leonie Man, MD;  Location: [place] [facility];  Service: Cardiovascular;  Laterality: N/A;  . ESOPHAGOGASTRODUODENOSCOPY (EGD) WITH PROPOFOL N/A 09/01/2019   Procedure: ESOPHAGOGASTRODUODENOSCOPY (EGD) WITH PROPOFOL;  Surgeon: Toledo, Benay Pike, MD;  Location: [facility];  Service: Gastroenterology;  Laterality: N/A;  . ESOPHAGOGASTRODUODENOSCOPY (EGD) WITH PROPOFOL N/A 09/08/2019   Procedure: ESOPHAGOGASTRODUODENOSCOPY (EGD) WITH PROPOFOL;  Surgeon: [name], MD;  Location: [facility];  Service: Gastroenterology;  Laterality: N/A;  . EYE SURGERY    . HIP ARTHROPLASTY Right 03/29/2020   Procedure: ARTHROPLASTY BIPOLAR HIP (HEMIARTHROPLASTY);  Surgeon: Corky Mull, MD;  Location: [facility];  Service: Orthopedics;  Laterality: Right;  . INTRAVASCULAR PRESSURE WIRE/FFR STUDY N/A 03/11/2017   Procedure: INTRAVASCULAR PRESSURE WIRE/FFR STUDY;  Surgeon:  Leonie Man, MD;  Location: [place] [facility];  Service: Cardiovascular;  Laterality: N/A;  .  INTRAVASCULAR ULTRASOUND/IVUS N/A 03/11/2017   Procedure: Intravascular Ultrasound/IVUS;  Surgeon: Leonie Man, MD;  Location: [place] [facility];  Service: Cardiovascular;  Laterality: N/A;  . LEFT HEART CATH AND CORONARY ANGIOGRAPHY N/A 03/05/2017   Procedure: LEFT HEART CATH AND CORONARY ANGIOGRAPHY;  Surgeon: Isaias Cowman, MD;  Location: [place] [facility];  Service: Cardiovascular;  Laterality: N/A;  . PACEMAKER IMPLANT    . pacemaker/defibrillator Left     FAMILY HISTORY: Family History  Problem Relation Age of Onset  . Hypertension Father   . Heart attack Father   . Depression Sister   . Depression Brother   . Depression Brother     ADVANCED DIRECTIVES (Y/N):  N  HEALTH MAINTENANCE: Social History   Tobacco Use  . Smoking status: Current Every Day Smoker    Packs/day: 0.25    Types: E-cigarettes, Cigarettes  . Smokeless tobacco: Never Used  Vaping Use  . Vaping Use: Former  Substance Use Topics  . Alcohol use: Not Currently    Comment: occasionally  . Drug use: No     Colonoscopy:  PAP:  Bone density:  Lipid panel:  Allergies  Allergen Reactions  . Celebrex [Celecoxib] Anaphylaxis  . Glipizide Anaphylaxis  . Levaquin [Levofloxacin In D5w] Other (See Comments)    Heart arrhthymias  . Levofloxacin Other (See Comments) and Palpitations    ICD fired  . Lisinopril Swelling    Lip and facial swelling  . Sulfa Antibiotics Other (See Comments) and Anaphylaxis    Reaction: unknown  . Metformin Other (See Comments)    Gi tolerance   . Penicillins Rash and Other (See Comments)    Has patient had a PCN reaction causing immediate rash, facial/tongue/throat swelling, SOB or lightheadedness with hypotension: Unknown Has patient had a PCN reaction causing severe rash involving mucus membranes or skin necrosis: No Has patient had a PCN reaction that required hospitalization: No Has patient had a PCN reaction occurring within the last 10 years: No If  all of the above answers are "NO", then may proceed with Cephalosporin use.     Current Outpatient Medications  Medication Sig Dispense Refill  . allopurinol (ZYLOPRIM) 100 MG tablet Take 50 mg by mouth daily.    Marland Kitchen aspirin EC 81 MG tablet Take 81 mg by mouth at bedtime.     . colchicine 0.6 MG tablet Take 0.6 mg by mouth 2 (two) times daily.    Marland Kitchen dexamethasone (DECADRON) 4 MG tablet Take 5 tablets (20 mg total) by mouth daily. Take the day after Velcade on days 2,5,9,12. Take with breakfast 40 tablet 3  . docusate sodium (COLACE) 100 MG capsule Take 1 capsule (100 mg total) by mouth 2 (two) times daily. 10 capsule 0  . Ensure Max Protein (ENSURE MAX PROTEIN) LIQD Take 330 mLs (11 oz total) by mouth 2 (two) times daily between meals.    . fexofenadine (ALLEGRA) 180 MG tablet Take 180 mg by mouth daily.    Marland Kitchen FLUoxetine (PROZAC) 10 MG capsule Take 10 mg by mouth daily.    . folic acid (FOLVITE) 1 MG tablet folic acid 1 mg tablet    . HYDROcodone-acetaminophen (NORCO/VICODIN) 5-325 MG tablet Take 1-2 tablets by mouth every 4 (four) hours as needed for moderate pain. 30 tablet 0  . hydrocortisone (ANUSOL-HC) 2.5 % rectal cream Apply 1 application topically 2 (two) times daily.    Marland Kitchen  levothyroxine (SYNTHROID) 50 MCG tablet Take 1 tablet (50 mcg total) by mouth daily before breakfast. On Tuesday, Thursday, Saturday and Sunday    . levothyroxine (SYNTHROID) 75 MCG tablet Take 1 tablet (75 mcg total) by mouth daily before breakfast. On Monday, Wednesday and Friday    . magnesium oxide (MAG-OX) 400 MG tablet Take 200 mg by mouth daily.    Marland Kitchen mexiletine (MEXITIL) 200 MG capsule Take 1 capsule (200 mg total) by mouth every 12 (twelve) hours. 60 capsule 1  . midodrine (PROAMATINE) 10 MG tablet Take 1 tablet (10 mg total) by mouth 2 (two) times daily with a meal.    . ondansetron (ZOFRAN) 8 MG tablet Take 1 tablet (8 mg total) by mouth 2 (two) times daily as needed (Nausea or vomiting). 60 tablet 2  . OXYGEN  Inhale 2 L into the lungs continuous.    . pantoprazole (PROTONIX) 40 MG tablet Take 40 mg by mouth daily.     . polyethylene glycol (MIRALAX / GLYCOLAX) 17 g packet Take 17 g by mouth daily. 14 each 0  . potassium chloride SA (KLOR-CON) 20 MEQ tablet Take 20 mEq by mouth daily.     . Prenatal Vit-Fe Fumarate-FA (PRENATAL MULTIVITAMIN) TABS tablet Take 1 tablet by mouth daily.    . prochlorperazine (COMPAZINE) 10 MG tablet Take 1 tablet (10 mg total) by mouth every 6 (six) hours as needed (Nausea or vomiting). 60 tablet 2  . simvastatin (ZOCOR) 40 MG tablet Take 20 mg by mouth at bedtime.   2  . spironolactone (ALDACTONE) 25 MG tablet Take 25 mg by mouth daily.     . sucralfate (CARAFATE) 1 g tablet Take 1 tablet by mouth 3 (three) times daily before meals.    . torsemide (DEMADEX) 20 MG tablet Take 2 tablets (40 mg total) by mouth daily. Future refills need to come from kidney doctor 60 tablet 1  . traMADol (ULTRAM) 50 MG tablet Take 1 tablet (50 mg total) by mouth every 6 (six) hours as needed for moderate pain. 30 tablet 0  . traZODone (DESYREL) 50 MG tablet Take 50 mg by mouth at bedtime.    . chlorpheniramine-HYDROcodone (TUSSIONEX) 10-8 MG/5ML SUER Take 5 mLs by mouth every 12 (twelve) hours as needed for cough. 140 mL 0  . ferrous sulfate 325 (65 FE) MG tablet Take 1 tablet (325 mg total) by mouth 2 (two) times daily with a meal. (Patient not taking: No sig reported)  3  . LANTUS SOLOSTAR 100 UNIT/ML Solostar Pen Inject 16 Units into the skin at bedtime. (Patient not taking: No sig reported)    . lenalidomide (REVLIMID) 10 MG capsule Take 1 capsule (10 mg total) by mouth daily. Take 14 days on, 7 days off, repeat every 21 days (Patient not taking: No sig reported) 14 capsule 0  . lidocaine (LIDODERM) 5 % Place 1 patch onto the skin daily. Remove & Discard patch within 12 hours or as directed by MD (Patient not taking: No sig reported) 30 patch 0  . nystatin cream (MYCOSTATIN) Apply 1  application topically 2 (two) times daily. (Patient not taking: No sig reported)     No current facility-administered medications for this visit.    OBJECTIVE: Vitals:   06/07/20 0952  BP: (!) 109/57  Pulse: 82  Temp: 98.2 F (36.8 C)  SpO2: 100%     Body mass index is 26.93 kg/m.    ECOG FS:1 - Symptomatic but completely ambulatory  General: Well-developed, well-nourished,  no acute distress. Sitting in a wheelchair. Eyes: Pink conjunctiva, anicteric sclera. HEENT: Normocephalic, moist mucous membranes. Lungs: No audible wheezing or coughing. Heart: Regular rate and rhythm. Abdomen: Soft, nontender, no obvious distention. Musculoskeletal: No edema, cyanosis, or clubbing. Neuro: Alert, answering all questions appropriately. Cranial nerves grossly intact. Skin: No rashes or petechiae noted. Psych: Normal affect.   LAB RESULTS:  Lab Results  Component Value Date   NA 134 (L) 06/07/2020   K 4.3 06/07/2020   CL 92 (L) 06/07/2020   CO2 29 06/07/2020   GLUCOSE 163 (H) 06/07/2020   BUN 73 (H) 06/07/2020   CREATININE 2.09 (H) 06/07/2020   CALCIUM 9.2 06/07/2020   PROT 7.0 06/07/2020   ALBUMIN 4.2 06/07/2020   AST 60 (H) 06/07/2020   ALT 100 (H) 06/07/2020   ALKPHOS 83 06/07/2020   BILITOT 0.5 06/07/2020   GFRNONAA 24 (L) 06/07/2020   GFRAA 30 (L) 09/09/2019    Lab Results  Component Value Date   WBC 16.7 (H) 06/07/2020   NEUTROABS 11.4 (H) 06/07/2020   HGB 10.5 (L) 06/07/2020   HCT 31.4 (L) 06/07/2020   MCV 96.3 06/07/2020   PLT 428 (H) 06/07/2020   Lab Results  Component Value Date   IRON 85 05/23/2020   TIBC 421 05/23/2020   IRONPCTSAT 20 05/23/2020   Lab Results  Component Value Date   FERRITIN 50 05/23/2020     STUDIES: No results found.  ASSESSMENT: Multiple myeloma, fractured right hip.  PLAN:    1.  Multiple myeloma: Bone marrow biopsy confirmed diagnosis with plasma cells up to 90% of biopsy.  Cytogenetics are reported as normal.   Metastatic bone survey from March 03, 2020 reviewed independently and report as above with multiple skeletal lucencies consistent with myeloma. Her M spike was initially 1.2, but now has trended down to 0.1. IgA levels have decreased from 2773 to 302.  Kappa and lambda free light chains are within normal limits.  Patient noted to have endorgan damage of renal insufficiency as well as persistent anemia. Initial plan was to give Velcade on days 1, 4, 8, and 11 along with 10 mg Revlimid on days 1 through 14.  Revlimid has been dose reduced secondary to renal insufficiency.  Patient will also receive weekly 20 mg dexamethasone. Although her symptoms last week are likely not related to Revlimid, patient does not wish to continue this treatment and will proceed with Velcade only for cycle 3 and beyond. Return to clinic in 1 week for further evaluation and consideration of cycle 3, day 1.  2.  Anemia: Hemoglobin trending up and is now 10.5. Iron stores are within normal limits.  Likely multifactorial with chronic renal insufficiency, underlying myeloma, as well as history of recent GI bleed.  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  Monitor. 3.  Chronic renal insufficiency: Creatinine slightly increased to 2.09. Revlimid has been discontinued. Velcade does not need to be dosed reduced and renal dysfunction.  Monitor.   4.  Ascites: Patient does not complain of this today.  Continue follow-up with heart failure clinic as scheduled. 5.  Angiodysplastic lesions: Continue follow-up with GI as scheduled. 6.  Diarrhea: Patient does not complain of this today.  Continue Lomotil as needed. 7.  Right hip fracture: Improving.  Continue rehab and follow-up with orthopedics as scheduled.  I spent a total of 30 minutes reviewing chart data, face-to-face evaluation with the patient, counseling and coordination of care as detailed  above.    Patient expressed  understanding and was in agreement with this plan. She also understands that She can call clinic at any time with any questions, concerns, or complaints.    Lloyd Huger, MD   06/08/2020 6:30 AM

## 2020-06-06 ENCOUNTER — Other Ambulatory Visit: Payer: Self-pay | Admitting: Family

## 2020-06-07 ENCOUNTER — Other Ambulatory Visit: Payer: Self-pay | Admitting: *Deleted

## 2020-06-07 ENCOUNTER — Inpatient Hospital Stay (HOSPITAL_BASED_OUTPATIENT_CLINIC_OR_DEPARTMENT_OTHER): Payer: Medicare Other | Admitting: Oncology

## 2020-06-07 ENCOUNTER — Inpatient Hospital Stay: Payer: Medicare Other

## 2020-06-07 ENCOUNTER — Encounter: Payer: Self-pay | Admitting: Oncology

## 2020-06-07 VITALS — BP 109/57 | HR 82 | Temp 98.2°F | Wt 156.9 lb

## 2020-06-07 DIAGNOSIS — C9000 Multiple myeloma not having achieved remission: Secondary | ICD-10-CM

## 2020-06-07 DIAGNOSIS — Z5111 Encounter for antineoplastic chemotherapy: Secondary | ICD-10-CM | POA: Diagnosis not present

## 2020-06-07 DIAGNOSIS — D649 Anemia, unspecified: Secondary | ICD-10-CM

## 2020-06-07 LAB — CBC WITH DIFFERENTIAL/PLATELET
Abs Immature Granulocytes: 0.24 10*3/uL — ABNORMAL HIGH (ref 0.00–0.07)
Basophils Absolute: 0 10*3/uL (ref 0.0–0.1)
Basophils Relative: 0 %
Eosinophils Absolute: 0 10*3/uL (ref 0.0–0.5)
Eosinophils Relative: 0 %
HCT: 31.4 % — ABNORMAL LOW (ref 36.0–46.0)
Hemoglobin: 10.5 g/dL — ABNORMAL LOW (ref 12.0–15.0)
Immature Granulocytes: 1 %
Lymphocytes Relative: 17 %
Lymphs Abs: 2.8 10*3/uL (ref 0.7–4.0)
MCH: 32.2 pg (ref 26.0–34.0)
MCHC: 33.4 g/dL (ref 30.0–36.0)
MCV: 96.3 fL (ref 80.0–100.0)
Monocytes Absolute: 2.2 10*3/uL — ABNORMAL HIGH (ref 0.1–1.0)
Monocytes Relative: 13 %
Neutro Abs: 11.4 10*3/uL — ABNORMAL HIGH (ref 1.7–7.7)
Neutrophils Relative %: 69 %
Platelets: 428 10*3/uL — ABNORMAL HIGH (ref 150–400)
RBC: 3.26 MIL/uL — ABNORMAL LOW (ref 3.87–5.11)
RDW: 16.6 % — ABNORMAL HIGH (ref 11.5–15.5)
WBC: 16.7 10*3/uL — ABNORMAL HIGH (ref 4.0–10.5)
nRBC: 0 % (ref 0.0–0.2)

## 2020-06-07 LAB — COMPREHENSIVE METABOLIC PANEL
ALT: 100 U/L — ABNORMAL HIGH (ref 0–44)
AST: 60 U/L — ABNORMAL HIGH (ref 15–41)
Albumin: 4.2 g/dL (ref 3.5–5.0)
Alkaline Phosphatase: 83 U/L (ref 38–126)
Anion gap: 13 (ref 5–15)
BUN: 73 mg/dL — ABNORMAL HIGH (ref 8–23)
CO2: 29 mmol/L (ref 22–32)
Calcium: 9.2 mg/dL (ref 8.9–10.3)
Chloride: 92 mmol/L — ABNORMAL LOW (ref 98–111)
Creatinine, Ser: 2.09 mg/dL — ABNORMAL HIGH (ref 0.44–1.00)
GFR, Estimated: 24 mL/min — ABNORMAL LOW (ref >60)
Glucose, Bld: 163 mg/dL — ABNORMAL HIGH (ref 70–99)
Potassium: 4.3 mmol/L (ref 3.5–5.1)
Sodium: 134 mmol/L — ABNORMAL LOW (ref 135–145)
Total Bilirubin: 0.5 mg/dL (ref 0.3–1.2)
Total Protein: 7 g/dL (ref 6.5–8.1)

## 2020-06-07 MED ORDER — HYDROCOD POLST-CPM POLST ER 10-8 MG/5ML PO SUER: 5.0000 mL | Freq: Two times a day (BID) | ORAL | 0 refills | Status: DC | PRN

## 2020-06-07 NOTE — Progress Notes (Signed)
Patient here today for follow up after [facility] visit, reports she is feeling better.

## 2020-06-08 LAB — PROTEIN ELECTROPHORESIS, SERUM
A/G Ratio: 1.4 (ref 0.7–1.7)
Albumin ELP: 3.7 g/dL (ref 2.9–4.4)
Alpha-1-Globulin: 0.2 g/dL (ref 0.0–0.4)
Alpha-2-Globulin: 1 g/dL (ref 0.4–1.0)
Beta Globulin: 1.1 g/dL (ref 0.7–1.3)
Gamma Globulin: 0.3 g/dL — ABNORMAL LOW (ref 0.4–1.8)
Globulin, Total: 2.7 g/dL (ref 2.2–3.9)
M-Spike, %: 0.1 g/dL — ABNORMAL HIGH
Total Protein ELP: 6.4 g/dL (ref 6.0–8.5)

## 2020-06-08 LAB — IGG, IGA, IGM
IgA: 302 mg/dL (ref 64–422)
IgG (Immunoglobin G), Serum: 270 mg/dL — ABNORMAL LOW (ref 586–1602)
IgM (Immunoglobulin M), Srm: 6 mg/dL — ABNORMAL LOW (ref 26–217)

## 2020-06-08 LAB — KAPPA/LAMBDA LIGHT CHAINS
Kappa free light chain: 9.8 mg/L (ref 3.3–19.4)
Kappa, lambda light chain ratio: 0.77 (ref 0.26–1.65)
Lambda free light chains: 12.7 mg/L (ref 5.7–26.3)

## 2020-06-13 ENCOUNTER — Inpatient Hospital Stay (HOSPITAL_BASED_OUTPATIENT_CLINIC_OR_DEPARTMENT_OTHER): Payer: Medicare Other | Admitting: Oncology

## 2020-06-13 ENCOUNTER — Other Ambulatory Visit: Payer: Self-pay

## 2020-06-13 ENCOUNTER — Inpatient Hospital Stay: Payer: Medicare Other | Attending: Oncology

## 2020-06-13 ENCOUNTER — Inpatient Hospital Stay: Payer: Medicare Other

## 2020-06-13 DIAGNOSIS — C9000 Multiple myeloma not having achieved remission: Secondary | ICD-10-CM

## 2020-06-13 DIAGNOSIS — F419 Anxiety disorder, unspecified: Secondary | ICD-10-CM | POA: Insufficient documentation

## 2020-06-13 DIAGNOSIS — Z7982 Long term (current) use of aspirin: Secondary | ICD-10-CM | POA: Diagnosis not present

## 2020-06-13 DIAGNOSIS — I130 Hypertensive heart and chronic kidney disease with heart failure and stage 1 through stage 4 chronic kidney disease, or unspecified chronic kidney disease: Secondary | ICD-10-CM | POA: Diagnosis not present

## 2020-06-13 DIAGNOSIS — I5042 Chronic combined systolic (congestive) and diastolic (congestive) heart failure: Secondary | ICD-10-CM | POA: Diagnosis not present

## 2020-06-13 DIAGNOSIS — E1122 Type 2 diabetes mellitus with diabetic chronic kidney disease: Secondary | ICD-10-CM | POA: Insufficient documentation

## 2020-06-13 DIAGNOSIS — Z7952 Long term (current) use of systemic steroids: Secondary | ICD-10-CM | POA: Diagnosis not present

## 2020-06-13 DIAGNOSIS — Z79899 Other long term (current) drug therapy: Secondary | ICD-10-CM | POA: Insufficient documentation

## 2020-06-13 DIAGNOSIS — Z5112 Encounter for antineoplastic immunotherapy: Secondary | ICD-10-CM | POA: Diagnosis not present

## 2020-06-13 DIAGNOSIS — J449 Chronic obstructive pulmonary disease, unspecified: Secondary | ICD-10-CM | POA: Diagnosis not present

## 2020-06-13 DIAGNOSIS — E279 Disorder of adrenal gland, unspecified: Secondary | ICD-10-CM | POA: Diagnosis not present

## 2020-06-13 DIAGNOSIS — F17210 Nicotine dependence, cigarettes, uncomplicated: Secondary | ICD-10-CM | POA: Diagnosis not present

## 2020-06-13 DIAGNOSIS — E785 Hyperlipidemia, unspecified: Secondary | ICD-10-CM | POA: Insufficient documentation

## 2020-06-13 DIAGNOSIS — Z8249 Family history of ischemic heart disease and other diseases of the circulatory system: Secondary | ICD-10-CM | POA: Insufficient documentation

## 2020-06-13 DIAGNOSIS — Z96641 Presence of right artificial hip joint: Secondary | ICD-10-CM | POA: Diagnosis not present

## 2020-06-13 DIAGNOSIS — Z950 Presence of cardiac pacemaker: Secondary | ICD-10-CM | POA: Insufficient documentation

## 2020-06-13 DIAGNOSIS — I252 Old myocardial infarction: Secondary | ICD-10-CM | POA: Insufficient documentation

## 2020-06-13 DIAGNOSIS — N189 Chronic kidney disease, unspecified: Secondary | ICD-10-CM | POA: Diagnosis not present

## 2020-06-13 DIAGNOSIS — E079 Disorder of thyroid, unspecified: Secondary | ICD-10-CM | POA: Insufficient documentation

## 2020-06-13 LAB — COMPREHENSIVE METABOLIC PANEL
ALT: 38 U/L (ref 0–44)
AST: 20 U/L (ref 15–41)
Albumin: 4 g/dL (ref 3.5–5.0)
Alkaline Phosphatase: 81 U/L (ref 38–126)
Anion gap: 11 (ref 5–15)
BUN: 42 mg/dL — ABNORMAL HIGH (ref 8–23)
CO2: 30 mmol/L (ref 22–32)
Calcium: 8.7 mg/dL — ABNORMAL LOW (ref 8.9–10.3)
Chloride: 96 mmol/L — ABNORMAL LOW (ref 98–111)
Creatinine, Ser: 1.87 mg/dL — ABNORMAL HIGH (ref 0.44–1.00)
GFR, Estimated: 27 mL/min — ABNORMAL LOW (ref >60)
Glucose, Bld: 134 mg/dL — ABNORMAL HIGH (ref 70–99)
Potassium: 4.4 mmol/L (ref 3.5–5.1)
Sodium: 137 mmol/L (ref 135–145)
Total Bilirubin: 0.4 mg/dL (ref 0.3–1.2)
Total Protein: 6.6 g/dL (ref 6.5–8.1)

## 2020-06-13 LAB — CBC WITH DIFFERENTIAL/PLATELET
Abs Immature Granulocytes: 0.1 10*3/uL — ABNORMAL HIGH (ref 0.00–0.07)
Basophils Absolute: 0.1 10*3/uL (ref 0.0–0.1)
Basophils Relative: 1 %
Eosinophils Absolute: 0.1 10*3/uL (ref 0.0–0.5)
Eosinophils Relative: 1 %
HCT: 29.7 % — ABNORMAL LOW (ref 36.0–46.0)
Hemoglobin: 9.5 g/dL — ABNORMAL LOW (ref 12.0–15.0)
Immature Granulocytes: 1 %
Lymphocytes Relative: 21 %
Lymphs Abs: 1.9 10*3/uL (ref 0.7–4.0)
MCH: 31.7 pg (ref 26.0–34.0)
MCHC: 32 g/dL (ref 30.0–36.0)
MCV: 99 fL (ref 80.0–100.0)
Monocytes Absolute: 1 10*3/uL (ref 0.1–1.0)
Monocytes Relative: 12 %
Neutro Abs: 5.8 10*3/uL (ref 1.7–7.7)
Neutrophils Relative %: 64 %
Platelets: 226 10*3/uL (ref 150–400)
RBC: 3 MIL/uL — ABNORMAL LOW (ref 3.87–5.11)
RDW: 17.5 % — ABNORMAL HIGH (ref 11.5–15.5)
WBC: 9 10*3/uL (ref 4.0–10.5)
nRBC: 0 % (ref 0.0–0.2)

## 2020-06-13 MED ORDER — DEXAMETHASONE 4 MG PO TABS
20.0000 mg | ORAL_TABLET | Freq: Once | ORAL | Status: AC
  Administered 2020-06-13: 20 mg via ORAL
  Filled 2020-06-13: qty 5

## 2020-06-13 MED ORDER — BORTEZOMIB CHEMO SQ INJECTION 3.5 MG (2.5MG/ML)
1.3000 mg/m2 | Freq: Once | INTRAMUSCULAR | Status: AC
  Administered 2020-06-13: 2.5 mg via SUBCUTANEOUS
  Filled 2020-06-13: qty 1

## 2020-06-13 MED ORDER — DEXAMETHASONE 4 MG PO TABS: 20.0000 mg | ORAL_TABLET | Freq: Every day | ORAL | 3 refills | Status: DC

## 2020-06-13 NOTE — Progress Notes (Signed)
Cr 1.87. Dr. Grayland Ormond made aware. Per Dr. Grayland Ormond, okay to proceed with treatment.   Patient tolerated injection well. Discharged home.

## 2020-06-13 NOTE — Progress Notes (Unsigned)
Pt and son in for follow up today.  Pt reports feeling much better than last week. Pt states need dexamethasone refilled.

## 2020-06-14 NOTE — Progress Notes (Signed)
[place]  [phone] [phone]  ID: Heather Peterson OB: [birth year]  MR#: [id number]  JTT#:[id number]  [name], MD as PCP - General (Family Medicine) Lloyd Huger, MD as Consulting Physician (Oncology)  CHIEF COMPLAINT: Multiple myeloma.  INTERVAL HISTORY: Patient returns to clinic today for further evaluation and reinitiation of treatment with single agent Velcade.  She feels significantly improved and nearly back to her baseline.  Her hip pain from her nonpathologic fracture is improving.  She has chronic shortness of breath and requires oxygen 24 hours/day. She has no neurologic complaints.  She denies any recent fevers.  She has a fair appetite.  She denies any chest pain, cough, or hemoptysis.  She denies any nausea, vomiting, or constipation.  She has no melena or hematochezia.  She has no urinary complaints.  Patient offers no further specific complaints today.  REVIEW OF SYSTEMS:   Review of Systems  Constitutional: Negative.  Negative for fever and malaise/fatigue.  Respiratory: Positive for shortness of breath. Negative for cough and hemoptysis.   Cardiovascular: Negative.  Negative for chest pain and leg swelling.  Gastrointestinal: Negative.  Negative for abdominal pain, diarrhea, melena and nausea.  Genitourinary: Negative.  Negative for dysuria and hematuria.  Musculoskeletal: Positive for joint pain. Negative for back pain.  Skin: Negative.  Negative for rash.  Neurological: Negative.  Negative for dizziness, focal weakness, weakness and headaches.  Psychiatric/Behavioral: Negative.  The patient is not nervous/anxious.     As per HPI. Otherwise, a complete review of systems is negative.  PAST MEDICAL HISTORY: Past Medical History:  Diagnosis Date  . Anxiety   . Chronic combined systolic and diastolic CHF (congestive heart failure) ([place])   . Chronic kidney disease   . Coronary artery  disease   . Depression   . Diabetes mellitus without complication ([place])   . Diabetes mellitus, type II ([place])   . Hypertension   . MI (myocardial infarction) ([place])    x 5  . Pacemaker   . Prolonged Q-T interval on ECG   . Thyroid disease     PAST SURGICAL HISTORY: Past Surgical History:  Procedure Laterality Date  . CENTRAL LINE INSERTION  03/11/2017   Procedure: CENTRAL LINE INSERTION;  Surgeon: Leonie Man, MD;  Location: [place] [facility];  Service: Cardiovascular;;  . CHOLECYSTECTOMY    . COLONOSCOPY WITH PROPOFOL N/A 09/01/2019   Procedure: COLONOSCOPY WITH PROPOFOL;  Surgeon: Toledo, Benay Pike, MD;  Location: [facility];  Service: Gastroenterology;  Laterality: N/A;  . CORONARY STENT INTERVENTION W/IMPELLA N/A 03/11/2017   Procedure: Coronary Stent Intervention w/Impella;  Surgeon: Leonie Man, MD;  Location: [place] [facility];  Service: Cardiovascular;  Laterality: N/A;  . ESOPHAGOGASTRODUODENOSCOPY (EGD) WITH PROPOFOL N/A 09/01/2019   Procedure: ESOPHAGOGASTRODUODENOSCOPY (EGD) WITH PROPOFOL;  Surgeon: Toledo, Benay Pike, MD;  Location: [facility];  Service: Gastroenterology;  Laterality: N/A;  . ESOPHAGOGASTRODUODENOSCOPY (EGD) WITH PROPOFOL N/A 09/08/2019   Procedure: ESOPHAGOGASTRODUODENOSCOPY (EGD) WITH PROPOFOL;  Surgeon: [name], MD;  Location: [facility];  Service: Gastroenterology;  Laterality: N/A;  . EYE SURGERY    . HIP ARTHROPLASTY Right 03/29/2020   Procedure: ARTHROPLASTY BIPOLAR HIP (HEMIARTHROPLASTY);  Surgeon: Corky Mull, MD;  Location: [facility];  Service: Orthopedics;  Laterality: Right;  . INTRAVASCULAR PRESSURE WIRE/FFR STUDY N/A 03/11/2017   Procedure: INTRAVASCULAR PRESSURE WIRE/FFR STUDY;  Surgeon: Leonie Man, MD;  Location: [place] [facility];  Service: Cardiovascular;  Laterality: N/A;  .  INTRAVASCULAR ULTRASOUND/IVUS N/A 03/11/2017   Procedure: Intravascular Ultrasound/IVUS;  Surgeon: Leonie Man, MD;  Location: [place] [facility];  Service: Cardiovascular;  Laterality: N/A;  . LEFT HEART CATH AND CORONARY ANGIOGRAPHY N/A 03/05/2017   Procedure: LEFT HEART CATH AND CORONARY ANGIOGRAPHY;  Surgeon: Isaias Cowman, MD;  Location: [place] [facility];  Service: Cardiovascular;  Laterality: N/A;  . PACEMAKER IMPLANT    . pacemaker/defibrillator Left     FAMILY HISTORY: Family History  Problem Relation Age of Onset  . Hypertension Father   . Heart attack Father   . Depression Sister   . Depression Brother   . Depression Brother     ADVANCED DIRECTIVES (Y/N):  N  HEALTH MAINTENANCE: Social History   Tobacco Use  . Smoking status: Current Every Day Smoker    Packs/day: 0.25    Types: E-cigarettes, Cigarettes  . Smokeless tobacco: Never Used  Vaping Use  . Vaping Use: Former  Substance Use Topics  . Alcohol use: Not Currently    Comment: occasionally  . Drug use: No     Colonoscopy:  PAP:  Bone density:  Lipid panel:  Allergies  Allergen Reactions  . Celebrex [Celecoxib] Anaphylaxis  . Glipizide Anaphylaxis  . Levaquin [Levofloxacin In D5w] Other (See Comments)    Heart arrhthymias  . Levofloxacin Other (See Comments) and Palpitations    ICD fired  . Lisinopril Swelling    Lip and facial swelling  . Sulfa Antibiotics Other (See Comments) and Anaphylaxis    Reaction: unknown  . Metformin Other (See Comments)    Gi tolerance   . Penicillins Rash and Other (See Comments)    Has patient had a PCN reaction causing immediate rash, facial/tongue/throat swelling, SOB or lightheadedness with hypotension: Unknown Has patient had a PCN reaction causing severe rash involving mucus membranes or skin necrosis: No Has patient had a PCN reaction that required hospitalization: No Has patient had a PCN reaction occurring within the last 10 years: No If all of the above answers are "NO", then may proceed with Cephalosporin use.     Current Outpatient Medications  Medication Sig  Dispense Refill  . allopurinol (ZYLOPRIM) 100 MG tablet Take 50 mg by mouth daily.    Marland Kitchen aspirin EC 81 MG tablet Take 81 mg by mouth at bedtime.     . chlorpheniramine-HYDROcodone (TUSSIONEX) 10-8 MG/5ML SUER Take 5 mLs by mouth every 12 (twelve) hours as needed for cough. 140 mL 0  . colchicine 0.6 MG tablet Take 0.6 mg by mouth 2 (two) times daily.    Marland Kitchen docusate sodium (COLACE) 100 MG capsule Take 1 capsule (100 mg total) by mouth 2 (two) times daily. 10 capsule 0  . Ensure Max Protein (ENSURE MAX PROTEIN) LIQD Take 330 mLs (11 oz total) by mouth 2 (two) times daily between meals.    . fexofenadine (ALLEGRA) 180 MG tablet Take 180 mg by mouth daily.    Marland Kitchen FLUoxetine (PROZAC) 10 MG capsule Take 10 mg by mouth daily.    . folic acid (FOLVITE) 1 MG tablet folic acid 1 mg tablet    . hydrocortisone (ANUSOL-HC) 2.5 % rectal cream Apply 1 application topically 2 (two) times daily.    Marland Kitchen levothyroxine (SYNTHROID) 50 MCG tablet Take 1 tablet (50 mcg total) by mouth daily before breakfast. On Tuesday, Thursday, Saturday and Sunday    . levothyroxine (SYNTHROID) 75 MCG tablet Take 1 tablet (75 mcg total) by mouth daily before breakfast. On Monday, Wednesday and  Friday    . magnesium oxide (MAG-OX) 400 MG tablet Take 200 mg by mouth daily.    Marland Kitchen mexiletine (MEXITIL) 200 MG capsule Take 1 capsule (200 mg total) by mouth every 12 (twelve) hours. 60 capsule 1  . midodrine (PROAMATINE) 10 MG tablet Take 1 tablet (10 mg total) by mouth 2 (two) times daily with a meal.    . OXYGEN Inhale 2 L into the lungs continuous.    . pantoprazole (PROTONIX) 40 MG tablet Take 40 mg by mouth daily.     . polyethylene glycol (MIRALAX / GLYCOLAX) 17 g packet Take 17 g by mouth daily. 14 each 0  . potassium chloride SA (KLOR-CON) 20 MEQ tablet Take 20 mEq by mouth daily.     . Prenatal Vit-Fe Fumarate-FA (PRENATAL MULTIVITAMIN) TABS tablet Take 1 tablet by mouth daily.    . simvastatin (ZOCOR) 40 MG tablet Take 20 mg by mouth at  bedtime.   2  . spironolactone (ALDACTONE) 25 MG tablet Take 25 mg by mouth daily.     . sucralfate (CARAFATE) 1 g tablet Take 1 tablet by mouth 3 (three) times daily before meals.    . torsemide (DEMADEX) 20 MG tablet Take 2 tablets (40 mg total) by mouth daily. Future refills need to come from kidney doctor 60 tablet 1  . traMADol (ULTRAM) 50 MG tablet Take 1 tablet (50 mg total) by mouth every 6 (six) hours as needed for moderate pain. 30 tablet 0  . traZODone (DESYREL) 50 MG tablet Take 50 mg by mouth at bedtime.    Marland Kitchen dexamethasone (DECADRON) 4 MG tablet Take 5 tablets (20 mg total) by mouth daily. Take the day after Velcade on days 2,5,9,12. Take with breakfast 40 tablet 3  . HYDROcodone-acetaminophen (NORCO/VICODIN) 5-325 MG tablet Take 1-2 tablets by mouth every 4 (four) hours as needed for moderate pain. (Patient not taking: Reported on 06/13/2020) 30 tablet 0  . LANTUS SOLOSTAR 100 UNIT/ML Solostar Pen Inject 16 Units into the skin at bedtime. (Patient not taking: No sig reported)    . lenalidomide (REVLIMID) 10 MG capsule Take 1 capsule (10 mg total) by mouth daily. Take 14 days on, 7 days off, repeat every 21 days (Patient not taking: No sig reported) 14 capsule 0  . lidocaine (LIDODERM) 5 % Place 1 patch onto the skin daily. Remove & Discard patch within 12 hours or as directed by MD (Patient not taking: No sig reported) 30 patch 0  . nystatin cream (MYCOSTATIN) Apply 1 application topically 2 (two) times daily. (Patient not taking: No sig reported)    . ondansetron (ZOFRAN) 8 MG tablet Take 1 tablet (8 mg total) by mouth 2 (two) times daily as needed (Nausea or vomiting). (Patient not taking: Reported on 06/13/2020) 60 tablet 2  . prochlorperazine (COMPAZINE) 10 MG tablet Take 1 tablet (10 mg total) by mouth every 6 (six) hours as needed (Nausea or vomiting). (Patient not taking: Reported on 06/13/2020) 60 tablet 2   No current facility-administered medications for this visit.     OBJECTIVE: Vitals:   06/13/20 1343  BP: 104/65  Pulse: 79  Resp: 18  Temp: 98.3 F (36.8 C)     Body mass index is 27.17 kg/m.    ECOG FS:1 - Symptomatic but completely ambulatory  General: Well-developed, well-nourished, no acute distress. Eyes: Pink conjunctiva, anicteric sclera. HEENT: Normocephalic, moist mucous membranes. Lungs: No audible wheezing or coughing. Heart: Regular rate and rhythm. Abdomen: Soft, nontender, no obvious distention.  Musculoskeletal: No edema, cyanosis, or clubbing. Neuro: Alert, answering all questions appropriately. Cranial nerves grossly intact. Skin: No rashes or petechiae noted. Psych: Normal affect.   LAB RESULTS:  Lab Results  Component Value Date   NA 137 06/13/2020   K 4.4 06/13/2020   CL 96 (L) 06/13/2020   CO2 30 06/13/2020   GLUCOSE 134 (H) 06/13/2020   BUN 42 (H) 06/13/2020   CREATININE 1.87 (H) 06/13/2020   CALCIUM 8.7 (L) 06/13/2020   PROT 6.6 06/13/2020   ALBUMIN 4.0 06/13/2020   AST 20 06/13/2020   ALT 38 06/13/2020   ALKPHOS 81 06/13/2020   BILITOT 0.4 06/13/2020   GFRNONAA 27 (L) 06/13/2020   GFRAA 30 (L) 09/09/2019    Lab Results  Component Value Date   WBC 9.0 06/13/2020   NEUTROABS 5.8 06/13/2020   HGB 9.5 (L) 06/13/2020   HCT 29.7 (L) 06/13/2020   MCV 99.0 06/13/2020   PLT 226 06/13/2020   Lab Results  Component Value Date   IRON 85 05/23/2020   TIBC 421 05/23/2020   IRONPCTSAT 20 05/23/2020   Lab Results  Component Value Date   FERRITIN 50 05/23/2020     STUDIES: No results found.  ASSESSMENT: Multiple myeloma, fractured right hip.  PLAN:    1.  Multiple myeloma: Bone marrow biopsy confirmed diagnosis with plasma cells up to 90% of biopsy.  Cytogenetics are reported as normal.  Metastatic bone survey from March 03, 2020 reviewed independently and report as above with multiple skeletal lucencies consistent with myeloma. Her M spike was initially 1.2, but now has trended down to 0.1.  IgA levels have decreased from 2773 to 302.  Kappa and lambda free light chains are within normal limits.  Patient noted to have endorgan damage of renal insufficiency as well as persistent anemia. Initial plan was to give Velcade on days 1, 4, 8, and 11 along with 10 mg Revlimid on days 1 through 14. Patient will also receive weekly 20 mg dexamethasone.  Revlimid has been discontinued and will proceed with Velcade and dexamethasone only.  Proceed with cycle 3, day 1 of treatment today.  Return to clinic on Friday for Velcade only, in 1 week for laboratory work, further evaluation and consideration of cycle 3, day 8.   2.  Anemia: Hemoglobin has trended down slightly to 9.5.  Iron stores are within normal limits.  Likely multifactorial with chronic renal insufficiency, underlying myeloma, as well as history of recent GI bleed.  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  Monitor. 3.  Chronic renal insufficiency: Improved to 1.87.  Velcade does not need to be dosed reduced and renal dysfunction.  Monitor.   4.  Ascites: Patient does not complain of this today.  Continue follow-up with heart failure clinic as scheduled. 5.  Angiodysplastic lesions: Continue follow-up with GI as scheduled. 6.  Diarrhea: Patient does not complain of this today.  Continue Lomotil as needed. 7.  Right hip fracture: Improving.  Continue rehab and follow-up with orthopedics as scheduled.  I spent a total of 30 minutes reviewing chart data, face-to-face evaluation with the patient, counseling and coordination of care as detailed above.  Patient expressed understanding and was in agreement with this plan. She also understands that She can call clinic at any time with any questions, concerns, or complaints.    Lloyd Huger, MD   06/14/2020 6:34 AM

## 2020-06-16 ENCOUNTER — Inpatient Hospital Stay: Payer: Medicare Other

## 2020-06-16 VITALS — BP 116/64 | HR 96 | Temp 97.7°F | Resp 20

## 2020-06-16 DIAGNOSIS — C9000 Multiple myeloma not having achieved remission: Secondary | ICD-10-CM

## 2020-06-16 DIAGNOSIS — Z5112 Encounter for antineoplastic immunotherapy: Secondary | ICD-10-CM | POA: Diagnosis not present

## 2020-06-16 MED ORDER — BORTEZOMIB CHEMO SQ INJECTION 3.5 MG (2.5MG/ML)
1.3000 mg/m2 | Freq: Once | INTRAMUSCULAR | Status: AC
  Administered 2020-06-16: 2.5 mg via SUBCUTANEOUS
  Filled 2020-06-16: qty 1

## 2020-06-16 MED ORDER — DEXAMETHASONE 4 MG PO TABS
20.0000 mg | ORAL_TABLET | Freq: Once | ORAL | Status: AC
  Administered 2020-06-16: 20 mg via ORAL
  Filled 2020-06-16: qty 5

## 2020-06-16 NOTE — Progress Notes (Signed)
Stable at discharge 

## 2020-06-18 ENCOUNTER — Emergency Department: Payer: Medicare Other

## 2020-06-18 ENCOUNTER — Other Ambulatory Visit: Payer: Self-pay

## 2020-06-18 ENCOUNTER — Observation Stay
Admission: EM | Admit: 2020-06-18 | Discharge: 2020-06-19 | Disposition: A | Discharge status: Home / Self Care | Payer: Medicare Other | Attending: Internal Medicine | Admitting: Internal Medicine

## 2020-06-18 ENCOUNTER — Encounter: Payer: Self-pay | Admitting: Intensive Care

## 2020-06-18 DIAGNOSIS — C9000 Multiple myeloma not having achieved remission: Secondary | ICD-10-CM | POA: Diagnosis not present

## 2020-06-18 DIAGNOSIS — Z20822 Contact with and (suspected) exposure to covid-19: Secondary | ICD-10-CM | POA: Insufficient documentation

## 2020-06-18 DIAGNOSIS — N183 Chronic kidney disease, stage 3 unspecified: Secondary | ICD-10-CM | POA: Insufficient documentation

## 2020-06-18 DIAGNOSIS — Z5111 Encounter for antineoplastic chemotherapy: Secondary | ICD-10-CM | POA: Insufficient documentation

## 2020-06-18 DIAGNOSIS — K5903 Drug induced constipation: Secondary | ICD-10-CM | POA: Diagnosis not present

## 2020-06-18 DIAGNOSIS — N1832 Chronic kidney disease, stage 3b: Secondary | ICD-10-CM | POA: Diagnosis present

## 2020-06-18 DIAGNOSIS — I10 Essential (primary) hypertension: Secondary | ICD-10-CM | POA: Diagnosis present

## 2020-06-18 DIAGNOSIS — D649 Anemia, unspecified: Secondary | ICD-10-CM | POA: Diagnosis not present

## 2020-06-18 DIAGNOSIS — R109 Unspecified abdominal pain: Secondary | ICD-10-CM

## 2020-06-18 DIAGNOSIS — T451X5A Adverse effect of antineoplastic and immunosuppressive drugs, initial encounter: Secondary | ICD-10-CM | POA: Diagnosis not present

## 2020-06-18 DIAGNOSIS — I110 Hypertensive heart disease with heart failure: Secondary | ICD-10-CM | POA: Diagnosis not present

## 2020-06-18 DIAGNOSIS — E1122 Type 2 diabetes mellitus with diabetic chronic kidney disease: Secondary | ICD-10-CM | POA: Diagnosis not present

## 2020-06-18 DIAGNOSIS — I2510 Atherosclerotic heart disease of native coronary artery without angina pectoris: Secondary | ICD-10-CM | POA: Diagnosis not present

## 2020-06-18 DIAGNOSIS — K7581 Nonalcoholic steatohepatitis (NASH): Secondary | ICD-10-CM | POA: Diagnosis not present

## 2020-06-18 DIAGNOSIS — K5900 Constipation, unspecified: Secondary | ICD-10-CM

## 2020-06-18 DIAGNOSIS — Z7984 Long term (current) use of oral hypoglycemic drugs: Secondary | ICD-10-CM | POA: Diagnosis not present

## 2020-06-18 DIAGNOSIS — D631 Anemia in chronic kidney disease: Secondary | ICD-10-CM | POA: Diagnosis present

## 2020-06-18 DIAGNOSIS — E063 Autoimmune thyroiditis: Secondary | ICD-10-CM | POA: Diagnosis present

## 2020-06-18 DIAGNOSIS — Z79899 Other long term (current) drug therapy: Secondary | ICD-10-CM | POA: Insufficient documentation

## 2020-06-18 DIAGNOSIS — J9610 Chronic respiratory failure, unspecified whether with hypoxia or hypercapnia: Secondary | ICD-10-CM | POA: Diagnosis present

## 2020-06-18 DIAGNOSIS — E875 Hyperkalemia: Secondary | ICD-10-CM | POA: Diagnosis not present

## 2020-06-18 DIAGNOSIS — J449 Chronic obstructive pulmonary disease, unspecified: Secondary | ICD-10-CM | POA: Diagnosis present

## 2020-06-18 DIAGNOSIS — N184 Chronic kidney disease, stage 4 (severe): Secondary | ICD-10-CM | POA: Diagnosis present

## 2020-06-18 DIAGNOSIS — E039 Hypothyroidism, unspecified: Secondary | ICD-10-CM | POA: Diagnosis not present

## 2020-06-18 DIAGNOSIS — I5042 Chronic combined systolic (congestive) and diastolic (congestive) heart failure: Secondary | ICD-10-CM | POA: Insufficient documentation

## 2020-06-18 HISTORY — DX: Malignant neoplasm of lymphoid, hematopoietic and related tissue, unspecified: C96.9

## 2020-06-18 LAB — LIPASE, BLOOD: Lipase: 76 U/L — ABNORMAL HIGH (ref 11–51)

## 2020-06-18 LAB — COMPREHENSIVE METABOLIC PANEL
ALT: 33 U/L (ref 0–44)
AST: 25 U/L (ref 15–41)
Albumin: 4.1 g/dL (ref 3.5–5.0)
Alkaline Phosphatase: 75 U/L (ref 38–126)
Anion gap: 9 (ref 5–15)
BUN: 49 mg/dL — ABNORMAL HIGH (ref 8–23)
CO2: 26 mmol/L (ref 22–32)
Calcium: 9.5 mg/dL (ref 8.9–10.3)
Chloride: 100 mmol/L (ref 98–111)
Creatinine, Ser: 1.45 mg/dL — ABNORMAL HIGH (ref 0.44–1.00)
GFR, Estimated: 37 mL/min — ABNORMAL LOW (ref >60)
Glucose, Bld: 203 mg/dL — ABNORMAL HIGH (ref 70–99)
Potassium: 5.7 mmol/L — ABNORMAL HIGH (ref 3.5–5.1)
Sodium: 135 mmol/L (ref 135–145)
Total Bilirubin: 0.5 mg/dL (ref 0.3–1.2)
Total Protein: 6.6 g/dL (ref 6.5–8.1)

## 2020-06-18 LAB — CBC
HCT: 31 % — ABNORMAL LOW (ref 36.0–46.0)
Hemoglobin: 9.9 g/dL — ABNORMAL LOW (ref 12.0–15.0)
MCH: 31.4 pg (ref 26.0–34.0)
MCHC: 31.9 g/dL (ref 30.0–36.0)
MCV: 98.4 fL (ref 80.0–100.0)
Platelets: 147 10*3/uL — ABNORMAL LOW (ref 150–400)
RBC: 3.15 MIL/uL — ABNORMAL LOW (ref 3.87–5.11)
RDW: 16.8 % — ABNORMAL HIGH (ref 11.5–15.5)
WBC: 10.9 10*3/uL — ABNORMAL HIGH (ref 4.0–10.5)
nRBC: 1.3 % — ABNORMAL HIGH (ref 0.0–0.2)

## 2020-06-18 MED ORDER — POLYETHYLENE GLYCOL 3350 17 G PO PACK: 17.0000 g | PACK | Freq: Every day | ORAL | 0 refills | Status: DC

## 2020-06-18 MED ORDER — MAGNESIUM CITRATE PO SOLN: 1.0000 | Freq: Once | ORAL | 0 refills | Status: AC

## 2020-06-18 MED ORDER — LACTULOSE 10 GM/15ML PO SOLN
30.0000 g | Freq: Once | ORAL | Status: AC
  Administered 2020-06-19: 30 g via ORAL
  Filled 2020-06-18: qty 60

## 2020-06-18 MED ORDER — IOHEXOL 300 MG/ML  SOLN
75.0000 mL | Freq: Once | INTRAMUSCULAR | Status: AC | PRN
  Administered 2020-06-18: 75 mL via INTRAVENOUS

## 2020-06-18 NOTE — Discharge Instructions (Addendum)
Please seek medical attention for any high fevers, chest pain, shortness of breath, change in behavior, persistent vomiting, bloody stool or any other new or concerning symptoms.  

## 2020-06-18 NOTE — ED Provider Notes (Signed)
[facility] Emergency Department Provider Note  ____________________________________________   I have reviewed the triage vital signs and the nursing notes.   HISTORY  Chief Complaint Constipation   History limited by: Not Limited   HPI Heather Peterson is a 80 y.o. female who presents to the emergency department today because of concerns for abdominal pain and lack of bowel movement for the past 5 days.  She states that her pain is located in the center area of her abdomen.  She denies feeling the urge to need to have a bowel movement but denies any pain in her rectum.  She states the pain has been somewhat constant.  She has had some nausea but no vomiting.  She has had decreased appetite and states that she feels full.  The patient denies any fevers.  She states she had similar symptoms roughly 1 month ago however it resolved on its own. Did have chemo therapy 2 days ago and states she has felt weak since then.  Records reviewed. Per medical record review patient has a history of DM, CAD,   Past Medical History:  Diagnosis Date  . Anxiety   . Chronic combined systolic and diastolic CHF (congestive heart failure) ([place])   . Chronic kidney disease   . Coronary artery disease   . Depression   . Diabetes mellitus without complication ([place])   . Diabetes mellitus, type II ([place])   . Hypertension   . MI (myocardial infarction) ([place])    x 5  . Pacemaker   . 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 Prolonged Q-T interval on ECG   . Thyroid disease     Patient Active Problem List   Diagnosis Date Noted  . Hypokalemia   . Fall   . Troponin I above reference range   . Status post hip hemiarthroplasty   . Hip fracture ([place]) 03/28/2020  . Multiple myeloma ([place]) 03/01/2020  . Goals of care, counseling/discussion 03/01/2020  . Acute blood loss anemia   . Gastrointestinal hemorrhage   . UGI bleed 09/04/2019  . Dehydration   . Anemia in chronic kidney  disease (CKD) 07/04/2019  . CKD (chronic kidney disease), stage III ([place]) 07/04/2019  . Chronic respiratory failure with hypoxia ([place]) 07/03/2019  . Hyponatremia 07/03/2019  . Prolonged Q-T interval on ECG   . Weakness   . Anemia   . 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 Tobacco use disorder 12/14/2018  . At risk for prolonged QT interval syndrome 12/14/2018  . 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 Pedal edema 02/19/2018  . SOB (shortness of breath) on exertion 02/19/2018  . Left main coronary artery disease 03/07/2017  . 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[place]) 03/06/2017  . Ventricular tachycardia ([place]) 03/05/2017  . Defibrillator discharge 03/04/2017  . CHF (congestive heart failure), NYHA class III, chronic, combined ([place]) 03/04/2017  . COPD (chronic obstructive pulmonary disease) ([place]) 02/10/2017  . Diabetes mellitus type 2, uncomplicated ([place]) 16/02/9603  . H/O ventricular fibrillation 02/10/2017  . Hyperlipidemia 02/10/2017  . 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 Ischemic cardiomyopathy 02/10/2017  . Myocardial infarction ([place]) 02/10/2017  . Moderate mitral regurgitation 02/10/2017  . Syncope 02/04/2017  . CAD (coronary artery disease) 02/04/2017  . HTN (hypertension) 02/04/2017  . Diabetes ([place]) 02/04/2017    Past Surgical History:  Procedure Laterality Date  . CENTRAL LINE INSERTION  03/11/2017   Procedure: CENTRAL LINE INSERTION;  Surgeon: Leonie Man, MD;  Location: [place] [facility];  Service: Cardiovascular;;  . CHOLECYSTECTOMY    . COLONOSCOPY WITH PROPOFOL N/A 09/01/2019   Procedure: COLONOSCOPY WITH PROPOFOL;  Surgeon: Tol[place], Benay Pike, MD;  Location: [facility] ENDOSCOPY;  Service: Gastroenterology;  Laterality: N/A;  . CORONARY STENT INTERVENTION W/IMPELLA N/A 03/11/2017   Procedure: Coronary Stent Intervention w/Impella;  Surgeon: Leonie Man, MD;  Location: [place] [facility];  Service: Cardiovascular;  Laterality: N/A;  . ESOPHAGOGASTRODUODENOSCOPY (EGD) WITH PROPOFOL N/A 09/01/2019   Procedure: ESOPHAGOGASTRODUODENOSCOPY (EGD) WITH PROPOFOL;  Surgeon: Toledo, Benay Pike, MD;  Location: [facility] ENDOSCOPY;  Service: Gastroenterology;  Laterality: N/A;  . ESOPHAGOGASTRODUODENOSCOPY (EGD) WITH PROPOFOL N/A 09/08/2019   Procedure: ESOPHAGOGASTRODUODENOSCOPY (EGD) WITH PROPOFOL;  Surgeon: [name], MD;  Location: [facility] ENDOSCOPY;  Service: Gastroenterology;  Laterality: N/A;  . EYE SURGERY    . HIP ARTHROPLASTY Right 03/29/2020   Procedure: ARTHROPLASTY BIPOLAR HIP (HEMIARTHROPLASTY);  Surgeon: Corky Mull, MD;  Location: [facility] ORS;  Service: Orthopedics;  Laterality: Right;  . INTRAVASCULAR PRESSURE WIRE/FFR STUDY N/A 03/11/2017   Procedure: INTRAVASCULAR PRESSURE WIRE/FFR STUDY;  Surgeon: Leonie Man, MD;  Location: [place] [facility];  Service: Cardiovascular;  Laterality: N/A;  . INTRAVASCULAR ULTRASOUND/IVUS N/A 03/11/2017   Procedure: Intravascular Ultrasound/IVUS;  Surgeon: Leonie Man, MD;  Location: [place] [facility];  Service: Cardiovascular;  Laterality: N/A;  . LEFT HEART CATH AND CORONARY ANGIOGRAPHY N/A 03/05/2017   Procedure: LEFT HEART CATH AND CORONARY ANGIOGRAPHY;  Surgeon: Isaias Cowman, MD;  Location: [place] [facility];  Service: Cardiovascular;  Laterality: N/A;  . PACEMAKER IMPLANT    . pacemaker/defibrillator Left     Prior to Admission medications   Medication Sig Start Date End Date Taking? Authorizing Provider  allopurinol (ZYLOPRIM) 100 MG tablet Take 50 mg by mouth daily.    [provider]  aspirin EC 81 MG tablet Take 81 mg by mouth at bedtime.     [provider]  chlorpheniramine-HYDROcodone (TUSSIONEX) 10-8 MG/5ML SUER Take 5 mLs by mouth every 12 (twelve) hours as needed for cough. 06/07/20   Lloyd Huger, MD  colchicine 0.6 MG tablet Take 0.6 mg by mouth 2 (two) times daily. 10/08/19    [provider]  dexamethasone (DECADRON) 4 MG tablet Take 5 tablets (20 mg total) by mouth daily. Take the day after Velcade on days 2,5,9,12. Take with breakfast 06/13/20   Lloyd Huger, MD  docusate sodium (COLACE) 100 MG capsule Take 1 capsule (100 mg total) by mouth 2 (two) times daily. 04/03/20   [name], MD  Ensure Max Protein (ENSURE MAX PROTEIN) LIQD Take 330 mLs (11 oz total) by mouth 2 (two) times daily between meals. 04/03/20   [name], MD  fexofenadine (ALLEGRA) 180 MG tablet Take 180 mg by mouth daily.    [provider]  FLUoxetine (PROZAC) 10 MG capsule Take 10 mg by mouth daily. 06/29/19   [provider]  folic acid (FOLVITE) 1 MG tablet folic acid 1 mg tablet    [provider]  HYDROcodone-acetaminophen (NORCO/VICODIN) 5-325 MG tablet Take 1-2 tablets by mouth every 4 (four) hours as needed for moderate pain. Patient not taking: Reported on 06/13/2020 05/30/20   Lloyd Huger, MD  hydrocortisone (ANUSOL-HC) 2.5 % rectal cream Apply 1 application topically 2 (two) times daily. 02/12/20   [provider]  LANTUS SOLOSTAR 100 UNIT/ML Solostar Pen Inject 16 Units into the skin at bedtime. Patient not taking: No sig reported 03/09/20  [provider]  lenalidomide (REVLIMID) 10 MG capsule Take 1 capsule (10 mg total) by mouth daily. Take 14 days on, 7 days off, repeat every 21 days Patient not taking: No sig reported 04/20/20   Lloyd Huger, MD  levothyroxine (SYNTHROID) 50 MCG tablet Take 1 tablet (50 mcg total) by mouth daily before breakfast. On Tuesday, Thursday, Saturday and Sunday 04/03/20   [name], MD  levothyroxine (SYNTHROID) 75 MCG tablet Take 1 tablet (75 mcg total) by mouth daily before breakfast. On Monday, Wednesday and Friday 04/03/20   [name], MD  lidocaine (LIDODERM) 5 % Place 1 patch onto the skin daily. Remove & Discard patch within 12 hours or as directed by MD Patient not  taking: No sig reported 04/04/20   [name], MD  magnesium oxide (MAG-OX) 400 MG tablet Take 200 mg by mouth daily.    [provider]  mexiletine (MEXITIL) 200 MG capsule Take 1 capsule (200 mg total) by mouth every 12 (twelve) hours. 03/14/17   Patsey Berthold, NP  midodrine (PROAMATINE) 10 MG tablet Take 1 tablet (10 mg total) by mouth 2 (two) times daily with a meal. 04/03/20   [name], MD  nystatin cream (MYCOSTATIN) Apply 1 application topically 2 (two) times daily. Patient not taking: No sig reported 03/06/20   [provider]  ondansetron (ZOFRAN) 8 MG tablet Take 1 tablet (8 mg total) by mouth 2 (two) times daily as needed (Nausea or vomiting). Patient not taking: Reported on 06/13/2020 03/01/20   Lloyd Huger, MD  OXYGEN Inhale 2 L into the lungs continuous.    [provider]  pantoprazole (PROTONIX) 40 MG tablet Take 40 mg by mouth daily.  05/16/19   [provider]  polyethylene glycol (MIRALAX / GLYCOLAX) 17 g packet Take 17 g by mouth daily. 04/03/20   [name], MD  potassium chloride SA (KLOR-CON) 20 MEQ tablet Take 20 mEq by mouth daily.     [provider]  Prenatal Vit-Fe Fumarate-FA (PRENATAL MULTIVITAMIN) TABS tablet Take 1 tablet by mouth daily.    [provider]  prochlorperazine (COMPAZINE) 10 MG tablet Take 1 tablet (10 mg total) by mouth every 6 (six) hours as needed (Nausea or vomiting). Patient not taking: Reported on 06/13/2020 03/01/20   Lloyd Huger, MD  simvastatin (ZOCOR) 40 MG tablet Take 20 mg by mouth at bedtime.     [provider]  spironolactone (ALDACTONE) 25 MG tablet Take 25 mg by mouth daily.     [provider]  sucralfate (CARAFATE) 1 g tablet Take 1 tablet by mouth 3 (three) times daily before meals. 09/15/19 09/14/20  [provider]  torsemide (DEMADEX) 20 MG tablet Take 2 tablets (40 mg total) by mouth daily. Future refills need to come from kidney  doctor 02/21/20   Alisa Graff, FNP  traMADol (ULTRAM) 50 MG tablet Take 1 tablet (50 mg total) by mouth every 6 (six) hours as needed for moderate pain. 04/03/20   [name], MD  traZODone (DESYREL) 50 MG tablet Take 50 mg by mouth at bedtime.    [provider]    Allergies Celebrex [celecoxib], Glipizide, Levaquin [levofloxacin in d5w], Levofloxacin, Lisinopril, Sulfa antibiotics, Metformin, and Penicillins  Family History  Problem Relation Age of Onset  . Hypertension Father   . Heart attack Father   . Depression Sister   . Depression Brother   . Depression Brother     Social History Social History  Tobacco Use  . Smoking status: Current Every Day Smoker    Packs/day: 0.25    Types: E-cigarettes, Cigarettes  . Smokeless tobacco: Never Used  Vaping Use  . Vaping Use: Former  Substance Use Topics  . Alcohol use: Not Currently    Comment: occasionally  . Drug use: Yes    Comment: prescribed pain meds    Review of Systems Constitutional: No fever/chills. Positive for generalized weakness. Eyes: No visual changes. ENT: No sore throat. Cardiovascular: Denies chest pain. Respiratory: Denies shortness of breath. Gastrointestinal: Positive for centralized abdominal pain, nausea. No bowel movement in 5 days.  Genitourinary: Negative for dysuria. Musculoskeletal: Negative for back pain. Skin: Negative for rash. Neurological: Negative for headaches, focal weakness or numbness.  ____________________________________________   PHYSICAL EXAM:  VITAL SIGNS: ED Triage Vitals  Enc Vitals Group     BP 06/18/20 1751 114/70     Pulse Rate 06/18/20 1751 74     Resp 06/18/20 1751 16     Temp 06/18/20 1751 (!) 97.5 F (36.4 C)     Temp Source 06/18/20 1751 Oral     SpO2 06/18/20 1751 96 %     Weight 06/18/20 1751 155 lb (70.3 kg)     Height 06/18/20 1751 5' 4"  (1.626 m)     Head Circumference --      Peak Flow --      Pain Score 06/18/20 1812 5    Constitutional: Alert and oriented.  Eyes: Conjunctivae are normal.  ENT      Head: Normocephalic and atraumatic.      Nose: No congestion/rhinnorhea.      Mouth/Throat: Mucous membranes are moist.      Neck: No stridor. Hematological/Lymphatic/Immunilogical: No cervical lymphadenopathy. Cardiovascular: Normal rate, regular rhythm.  No murmurs, rubs, or gallops.  Respiratory: Normal respiratory effort without tachypnea nor retractions. Breath sounds are clear and equal bilaterally. No wheezes/rales/rhonchi. Gastrointestinal: Slightly distended. Diffusely tender to palpation. Tympanitic.  Genitourinary: Deferred Musculoskeletal: Normal range of motion in all extremities. No lower extremity edema. Neurologic:  Normal speech and language. No gross focal neurologic deficits are appreciated.  Skin:  Skin is warm, dry and intact. No rash noted. Psychiatric: Mood and affect are normal. Speech and behavior are normal. Patient exhibits appropriate insight and judgment.  ____________________________________________    LABS (pertinent positives/negatives)  Lipase 76 CBC wbc 10.9, hgb 9.9, plt 147 CMP na 135, k 5.7, glu 203, cr 1.45  ____________________________________________   EKG  None  ____________________________________________    RADIOLOGY  CT abd/pel  Large stool burden. No obstruction. ____________________________________________   PROCEDURES  Procedures  ____________________________________________   INITIAL IMPRESSION / ASSESSMENT AND PLAN / ED COURSE  Pertinent labs & imaging results that were available during my care of the patient were reviewed by me and considered in my medical decision making (see chart for details).   Patient presented to the emergency department today because of concerns for abdominal discomfort and possible constipation.  On exam she does have some distention to her abdomen and is slightly tachypneic.  Patient is not complaining of  any rectal discomfort or pressure.  At this point I doubt impaction.  Given distention and neurolyse abdominal pain will get CT scan to evaluate.  CT scan shows large stool burden but no obstruction. Will give enema.   ____________________________________________   FINAL CLINICAL IMPRESSION(S) / ED DIAGNOSES  Final diagnoses:  Abdominal pain, unspecified abdominal location     Note: This dictation was prepared with Dragon dictation.  Any transcriptional errors that result from this process are unintentional     Nance Pear, MD 06/18/20 2322

## 2020-06-18 NOTE — ED Triage Notes (Signed)
Patient c/o constipation X5 days. Prescribed pain meds for bone marrow cancer and hip surgery in November 2021. Takes chemo shot twice a week. Last shot was Friday.

## 2020-06-19 ENCOUNTER — Encounter: Payer: Self-pay | Admitting: Internal Medicine

## 2020-06-19 DIAGNOSIS — D631 Anemia in chronic kidney disease: Secondary | ICD-10-CM

## 2020-06-19 DIAGNOSIS — J9610 Chronic respiratory failure, unspecified whether with hypoxia or hypercapnia: Secondary | ICD-10-CM | POA: Diagnosis present

## 2020-06-19 DIAGNOSIS — K5903 Drug induced constipation: Secondary | ICD-10-CM

## 2020-06-19 DIAGNOSIS — T402X5A Adverse effect of other opioids, initial encounter: Secondary | ICD-10-CM

## 2020-06-19 DIAGNOSIS — E875 Hyperkalemia: Secondary | ICD-10-CM | POA: Diagnosis not present

## 2020-06-19 DIAGNOSIS — N183 Chronic kidney disease, stage 3 unspecified: Secondary | ICD-10-CM | POA: Diagnosis not present

## 2020-06-19 DIAGNOSIS — E063 Autoimmune thyroiditis: Secondary | ICD-10-CM | POA: Diagnosis not present

## 2020-06-19 LAB — SARS CORONAVIRUS 2 BY RT PCR (HOSPITAL ORDER, PERFORMED IN ~~LOC~~ HOSPITAL LAB): SARS Coronavirus 2: NEGATIVE

## 2020-06-19 LAB — TROPONIN I (HIGH SENSITIVITY): Troponin I (High Sensitivity): 45 ng/L — ABNORMAL HIGH (ref <18)

## 2020-06-19 LAB — HEMOGLOBIN A1C
Hgb A1c MFr Bld: 6 % — ABNORMAL HIGH (ref 4.8–5.6)
Mean Plasma Glucose: 125.5 mg/dL

## 2020-06-19 LAB — GLUCOSE, CAPILLARY
Glucose-Capillary: 111 mg/dL — ABNORMAL HIGH (ref 70–99)
Glucose-Capillary: 119 mg/dL — ABNORMAL HIGH (ref 70–99)

## 2020-06-19 LAB — POTASSIUM: Potassium: 4 mmol/L (ref 3.5–5.1)

## 2020-06-19 MED ORDER — MINERAL OIL RE ENEM
1.0000 | ENEMA | Freq: Once | RECTAL | Status: AC
  Administered 2020-06-19: 1 via RECTAL

## 2020-06-19 MED ORDER — MAGNESIUM CITRATE PO SOLN
1.0000 | Freq: Once | ORAL | Status: AC
  Administered 2020-06-19: 1 via ORAL
  Filled 2020-06-19: qty 296

## 2020-06-19 MED ORDER — ONDANSETRON HCL 4 MG PO TABS: 4.0000 mg | ORAL_TABLET | Freq: Four times a day (QID) | ORAL | Status: DC | PRN

## 2020-06-19 MED ORDER — ALLOPURINOL 100 MG PO TABS
50.0000 mg | ORAL_TABLET | Freq: Every day | ORAL | Status: DC
  Administered 2020-06-19: 50 mg via ORAL
  Filled 2020-06-19: qty 1
  Filled 2020-06-19: qty 0.5

## 2020-06-19 MED ORDER — ONDANSETRON HCL 4 MG/2ML IJ SOLN: 4.0000 mg | Freq: Four times a day (QID) | INTRAMUSCULAR | Status: DC | PRN

## 2020-06-19 MED ORDER — SODIUM CHLORIDE 0.9 % IV SOLN: INTRAVENOUS | Status: DC

## 2020-06-19 MED ORDER — ASPIRIN EC 81 MG PO TBEC: 81.0000 mg | DELAYED_RELEASE_TABLET | Freq: Every day | ORAL | Status: DC

## 2020-06-19 MED ORDER — INSULIN ASPART 100 UNIT/ML ~~LOC~~ SOLN: 0.0000 [IU] | Freq: Three times a day (TID) | SUBCUTANEOUS | Status: DC

## 2020-06-19 MED ORDER — TRAZODONE HCL 50 MG PO TABS: 50.0000 mg | ORAL_TABLET | Freq: Every day | ORAL | Status: DC

## 2020-06-19 MED ORDER — ENOXAPARIN SODIUM 40 MG/0.4ML ~~LOC~~ SOLN
40.0000 mg | SUBCUTANEOUS | Status: DC
  Administered 2020-06-19: 40 mg via SUBCUTANEOUS
  Filled 2020-06-19: qty 0.4

## 2020-06-19 MED ORDER — ENSURE MAX PROTEIN PO LIQD
11.0000 [oz_av] | Freq: Two times a day (BID) | ORAL | Status: DC
  Filled 2020-06-19: qty 330

## 2020-06-19 MED ORDER — COLCHICINE 0.6 MG PO TABS
0.6000 mg | ORAL_TABLET | Freq: Two times a day (BID) | ORAL | Status: DC
  Administered 2020-06-19: 0.6 mg via ORAL
  Filled 2020-06-19 (×3): qty 1

## 2020-06-19 MED ORDER — LEVOTHYROXINE SODIUM 50 MCG PO TABS: 75.0000 ug | ORAL_TABLET | ORAL | Status: DC

## 2020-06-19 MED ORDER — FOLIC ACID 1 MG PO TABS
1.0000 mg | ORAL_TABLET | Freq: Every day | ORAL | Status: DC
Start: 2020-06-19 — End: 2020-06-20
  Administered 2020-06-19: 1 mg via ORAL
  Filled 2020-06-19: qty 1

## 2020-06-19 MED ORDER — MIDODRINE HCL 5 MG PO TABS: 10.0000 mg | ORAL_TABLET | Freq: Two times a day (BID) | ORAL | Status: DC

## 2020-06-19 MED ORDER — SUCRALFATE 1 G PO TABS
1.0000 g | ORAL_TABLET | Freq: Three times a day (TID) | ORAL | Status: DC
  Administered 2020-06-19: 1 g via ORAL
  Filled 2020-06-19: qty 1

## 2020-06-19 MED ORDER — PRENATAL MULTIVITAMIN CH
1.0000 | ORAL_TABLET | Freq: Every day | ORAL | Status: DC
  Filled 2020-06-19: qty 1

## 2020-06-19 MED ORDER — DEXAMETHASONE 6 MG PO TABS: 20.0000 mg | ORAL_TABLET | Freq: Every day | ORAL | Status: DC

## 2020-06-19 MED ORDER — MEXILETINE HCL 200 MG PO CAPS
200.0000 mg | ORAL_CAPSULE | Freq: Two times a day (BID) | ORAL | Status: DC
  Administered 2020-06-19: 200 mg via ORAL
  Filled 2020-06-19 (×2): qty 1

## 2020-06-19 MED ORDER — TORSEMIDE 20 MG PO TABS
40.0000 mg | ORAL_TABLET | Freq: Every day | ORAL | Status: DC
Start: 2020-06-19 — End: 2020-06-20
  Administered 2020-06-19: 40 mg via ORAL
  Filled 2020-06-19: qty 2

## 2020-06-19 MED ORDER — SIMVASTATIN 20 MG PO TABS
20.0000 mg | ORAL_TABLET | Freq: Every day | ORAL | Status: DC
  Filled 2020-06-19: qty 1

## 2020-06-19 MED ORDER — LEVOTHYROXINE SODIUM 50 MCG PO TABS: 50.0000 ug | ORAL_TABLET | ORAL | Status: DC

## 2020-06-19 MED ORDER — METHYLNALTREXONE BROMIDE 12 MG/0.6ML ~~LOC~~ SOLN
12.0000 mg | Freq: Once | SUBCUTANEOUS | Status: AC
  Administered 2020-06-19: 12 mg via SUBCUTANEOUS
  Filled 2020-06-19: qty 0.6

## 2020-06-19 MED ORDER — MAGNESIUM OXIDE 400 MG PO TABS
200.0000 mg | ORAL_TABLET | Freq: Every day | ORAL | Status: DC
  Administered 2020-06-19: 200 mg via ORAL
  Filled 2020-06-19: qty 1
  Filled 2020-06-19: qty 0.5

## 2020-06-19 MED ORDER — SODIUM ZIRCONIUM CYCLOSILICATE 10 G PO PACK
10.0000 g | PACK | Freq: Every day | ORAL | Status: DC
  Administered 2020-06-19: 10 g via ORAL
  Filled 2020-06-19 (×2): qty 1

## 2020-06-19 MED ORDER — DOCUSATE SODIUM 50 MG/5ML PO LIQD
100.0000 mg | Freq: Once | ORAL | Status: AC
  Administered 2020-06-19: 100 mg via ORAL
  Filled 2020-06-19: qty 10

## 2020-06-19 MED ORDER — SODIUM POLYSTYRENE SULFONATE 15 GM/60ML PO SUSP
30.0000 g | Freq: Once | ORAL | Status: AC
  Administered 2020-06-19: 30 g via ORAL
  Filled 2020-06-19: qty 120

## 2020-06-19 MED ORDER — PANTOPRAZOLE SODIUM 40 MG PO TBEC
40.0000 mg | DELAYED_RELEASE_TABLET | Freq: Every day | ORAL | Status: DC
Start: 2020-06-19 — End: 2020-06-20
  Administered 2020-06-19: 40 mg via ORAL
  Filled 2020-06-19: qty 1

## 2020-06-19 MED ORDER — FLUOXETINE HCL 10 MG PO CAPS
10.0000 mg | ORAL_CAPSULE | Freq: Every day | ORAL | Status: DC
  Filled 2020-06-19: qty 1

## 2020-06-19 MED ORDER — LORATADINE 10 MG PO TABS
10.0000 mg | ORAL_TABLET | Freq: Every day | ORAL | Status: DC
  Administered 2020-06-19: 10 mg via ORAL
  Filled 2020-06-19: qty 1

## 2020-06-19 NOTE — H&P (Addendum)
History and Physical    Heather Peterson DGL:[id number] DOB: 21-Apr-1941 DOA: 06/18/2020  PCP: [name], MD   Patient coming from: Home  I have personally briefly reviewed patient's old medical records in [place]  Chief Complaint: Constipation                                Abdominal pain  HPI: FORESTINE MACHO is a 80 y.o. 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 Pain is constant, nonradiating and is associated with nausea but no vomiting. Patient has a sensation of feeling full. She has not had a bowel movement in about 5 days which is unusual for her and is on opiates for cancer related pain. She denies having any fever, no chills, no headache, no cough, no urinary frequency, no nocturia, no dysuria, no dizziness, no lightheadedness, no lower extremity weakness, no palpitations, no diaphoresis, no chest pain, no shortness of breath Labs show sodium 135, potassium 5.7, chloride 100, bicarb 26, glucose 2 3, BUN 49, creatinine 1.45, calcium 9.5, alkaline phosphatase 75, albumin 4.1, lipase 76, AST 25, ALT 33, total protein 6.6, troponin 45, white count 10.9, hemoglobin 9.9, hematocrit 31, MCV 98.4, RDW 16.8, platelet count 147 Her SARS coronavirus 2 PCR test is negative Patient had a CT scan of abdomen and pelvis which showed moderate burden of formed stool throughout the ascending and transverse colon with fecalization of some distal loops of small bowel consistent with constipation with slow transit. No evidence of obstruction. 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  ED Course: Patient is a 80 year old female who presents  to the ER for evaluation of abdominal pain and constipation related to opioid therapy that she takes for cancer pain. Patient had a CT scan which showed moderate burden of formed stool throughout the ascending and transverse colon with fecalization of some distal loops of small bowel consistent with constipation. She received multiple enemas and laxatives in the ER (which include mineral oil enema, Relistor injection, Kayexalate, lactulose, magnesium citrate and soapsuds enema) without result.  She'll be referred to observation status for further evaluation.  Review of Systems: As per HPI otherwise all systems reviewed and negative.    Past Medical History:  Diagnosis Date  . Anxiety   . Chronic combined systolic and diastolic CHF (congestive heart failure) ([place])   . Chronic kidney disease   . Coronary artery disease   . Depression   . Diabetes mellitus without complication ([place])   . Diabetes mellitus, type II ([place])   . Hypertension   . MI (myocardial infarction) ([place])    x 5  . Pacemaker   . 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[place])   . Prolonged Q-T interval on ECG   . Thyroid disease     Past Surgical History:  Procedure Laterality Date  . CENTRAL LINE INSERTION  03/11/2017   Procedure: CENTRAL LINE INSERTION;  Surgeon: Leonie Man, MD;  Location: [facility];  Service: Cardiovascular;;  . CHOLECYSTECTOMY    . COLONOSCOPY WITH PROPOFOL N/A 09/01/2019   Procedure: COLONOSCOPY WITH PROPOFOL;  Surgeon: Alice Reichert, Benay Pike, MD;  Location: [facility]  ENDOSCOPY;  Service: Gastroenterology;  Laterality: N/A;  . CORONARY STENT INTERVENTION W/IMPELLA N/A 03/11/2017   Procedure: Coronary Stent Intervention w/Impella;  Surgeon: Leonie Man, MD;  Location: [facility];  Service: Cardiovascular;  Laterality: N/A;  . ESOPHAGOGASTRODUODENOSCOPY (EGD) WITH PROPOFOL N/A 09/01/2019   Procedure: ESOPHAGOGASTRODUODENOSCOPY (EGD) WITH PROPOFOL;  Surgeon: Toledo, Benay Pike, MD;  Location: [facility]  ENDOSCOPY;  Service: Gastroenterology;  Laterality: N/A;  . ESOPHAGOGASTRODUODENOSCOPY (EGD) WITH PROPOFOL N/A 09/08/2019   Procedure: ESOPHAGOGASTRODUODENOSCOPY (EGD) WITH PROPOFOL;  Surgeon: [name], MD;  Location: [facility] ENDOSCOPY;  Service: Gastroenterology;  Laterality: N/A;  . EYE SURGERY    . HIP ARTHROPLASTY Right 03/29/2020   Procedure: ARTHROPLASTY BIPOLAR HIP (HEMIARTHROPLASTY);  Surgeon: Corky Mull, MD;  Location: [facility] ORS;  Service: Orthopedics;  Laterality: Right;  . INTRAVASCULAR PRESSURE WIRE/FFR STUDY N/A 03/11/2017   Procedure: INTRAVASCULAR PRESSURE WIRE/FFR STUDY;  Surgeon: Leonie Man, MD;  Location: [facility];  Service: Cardiovascular;  Laterality: N/A;  . INTRAVASCULAR ULTRASOUND/IVUS N/A 03/11/2017   Procedure: Intravascular Ultrasound/IVUS;  Surgeon: Leonie Man, MD;  Location: [facility];  Service: Cardiovascular;  Laterality: N/A;  . LEFT HEART CATH AND CORONARY ANGIOGRAPHY N/A 03/05/2017   Procedure: LEFT HEART CATH AND CORONARY ANGIOGRAPHY;  Surgeon: Isaias Cowman, MD;  Location: [facility];  Service: Cardiovascular;  Laterality: N/A;  . PACEMAKER IMPLANT    . pacemaker/defibrillator Left      reports that she has been smoking e-cigarettes and cigarettes. She has been smoking about 0.25 packs per day. She has never used smokeless tobacco. She reports previous alcohol use. She reports current drug use.  Allergies  Allergen Reactions  . Celebrex [Celecoxib] Anaphylaxis  . Glipizide Anaphylaxis  . Levaquin [Levofloxacin In D5w] Other (See Comments)    Heart arrhthymias  . Levofloxacin Other (See Comments) and Palpitations    ICD fired  . Lisinopril Swelling    Lip and facial swelling  . Sulfa Antibiotics Other (See Comments) and Anaphylaxis    Reaction: unknown  . Metformin Other (See Comments)    Gi tolerance   . Penicillins Rash and Other (See Comments)    Has patient had a PCN reaction causing immediate  rash, facial/tongue/throat swelling, SOB or lightheadedness with hypotension: Unknown Has patient had a PCN reaction causing severe rash involving mucus membranes or skin necrosis: No Has patient had a PCN reaction that required hospitalization: No Has patient had a PCN reaction occurring within the last 10 years: No If all of the above answers are "NO", then may proceed with Cephalosporin use.     Family History  Problem Relation Age of Onset  . Hypertension Father   . Heart attack Father   . Depression Sister   . Depression Brother   . Depression Brother      Prior to Admission medications   Medication Sig Start Date End Date Taking? Authorizing Provider  polyethylene glycol (MIRALAX) 17 g packet Take 17 g by mouth daily. 06/18/20  Yes Nance Pear, MD  allopurinol (ZYLOPRIM) 100 MG tablet Take 50 mg by mouth daily.    [provider]  aspirin EC 81 MG tablet Take 81 mg by mouth at bedtime.     [provider]  chlorpheniramine-HYDROcodone (TUSSIONEX) 10-8 MG/5ML SUER Take 5 mLs by mouth every 12 (twelve) hours as needed for cough. 06/07/20   Lloyd Huger, MD  colchicine 0.6 MG tablet Take 0.6 mg by mouth 2 (two) times daily. 10/08/19  [provider]  dexamethasone (DECADRON) 4 MG tablet Take 5 tablets (20 mg total) by mouth daily. Take the day after Velcade on days 2,5,9,12. Take with breakfast 06/13/20   Lloyd Huger, MD  docusate sodium (COLACE) 100 MG capsule Take 1 capsule (100 mg total) by mouth 2 (two) times daily. 04/03/20   [name], MD  Ensure Max Protein (ENSURE MAX PROTEIN) LIQD Take 330 mLs (11 oz total) by mouth 2 (two) times daily between meals. 04/03/20   [name], MD  fexofenadine (ALLEGRA) 180 MG tablet Take 180 mg by mouth daily.    [provider]  FLUoxetine (PROZAC) 10 MG capsule Take 10 mg by mouth daily. 06/29/19   [provider]  folic acid (FOLVITE) 1 MG tablet folic acid 1 mg tablet     [provider]  HYDROcodone-acetaminophen (NORCO/VICODIN) 5-325 MG tablet Take 1-2 tablets by mouth every 4 (four) hours as needed for moderate pain. Patient not taking: Reported on 06/13/2020 05/30/20   Lloyd Huger, MD  hydrocortisone (ANUSOL-HC) 2.5 % rectal cream Apply 1 application topically 2 (two) times daily. 02/12/20   [provider]  LANTUS SOLOSTAR 100 UNIT/ML Solostar Pen Inject 16 Units into the skin at bedtime. Patient not taking: No sig reported 03/09/20   [provider]  lenalidomide (REVLIMID) 10 MG capsule Take 1 capsule (10 mg total) by mouth daily. Take 14 days on, 7 days off, repeat every 21 days Patient not taking: No sig reported 04/20/20   Lloyd Huger, MD  levothyroxine (SYNTHROID) 50 MCG tablet Take 1 tablet (50 mcg total) by mouth daily before breakfast. On Tuesday, Thursday, Saturday and Sunday 04/03/20   [name], MD  levothyroxine (SYNTHROID) 75 MCG tablet Take 1 tablet (75 mcg total) by mouth daily before breakfast. On Monday, Wednesday and Friday 04/03/20   [name], MD  lidocaine (LIDODERM) 5 % Place 1 patch onto the skin daily. Remove & Discard patch within 12 hours or as directed by MD Patient not taking: No sig reported 04/04/20   [name], MD  magnesium oxide (MAG-OX) 400 MG tablet Take 200 mg by mouth daily.    [provider]  mexiletine (MEXITIL) 200 MG capsule Take 1 capsule (200 mg total) by mouth every 12 (twelve) hours. 03/14/17   Patsey Berthold, NP  midodrine (PROAMATINE) 10 MG tablet Take 1 tablet (10 mg total) by mouth 2 (two) times daily with a meal. 04/03/20   [name], MD  nystatin cream (MYCOSTATIN) Apply 1 application topically 2 (two) times daily. Patient not taking: No sig reported 03/06/20   [provider]  ondansetron (ZOFRAN) 8 MG tablet Take 1 tablet (8 mg total) by mouth 2 (two) times daily as needed (Nausea or vomiting). Patient not taking: Reported on 06/13/2020  03/01/20   Lloyd Huger, MD  OXYGEN Inhale 2 L into the lungs continuous.    [provider]  pantoprazole (PROTONIX) 40 MG tablet Take 40 mg by mouth daily.  05/16/19   [provider]  potassium chloride SA (KLOR-CON) 20 MEQ tablet Take 20 mEq by mouth daily.     [provider]  Prenatal Vit-Fe Fumarate-FA (PRENATAL MULTIVITAMIN) TABS tablet Take 1 tablet by mouth daily.    [provider]  prochlorperazine (COMPAZINE) 10 MG tablet Take 1 tablet (10 mg total) by mouth every 6 (six) hours as needed (Nausea or vomiting). Patient not taking: Reported on 06/13/2020 03/01/20   Lloyd Huger, MD  simvastatin (ZOCOR) 40 MG tablet Take 20 mg by mouth at bedtime.     [provider]  spironolactone (ALDACTONE) 25 MG tablet Take 25 mg by mouth daily.     [provider]  sucralfate (CARAFATE) 1 g tablet Take 1 tablet by mouth 3 (three) times daily before meals. 09/15/19 09/14/20  [provider]  torsemide (DEMADEX) 20 MG tablet Take 2 tablets (40 mg total) by mouth daily. Future refills need to come from kidney doctor 02/21/20   Alisa Graff, FNP  traMADol (ULTRAM) 50 MG tablet Take 1 tablet (50 mg total) by mouth every 6 (six) hours as needed for moderate pain. 04/03/20   [name], MD  traZODone (DESYREL) 50 MG tablet Take 50 mg by mouth at bedtime.    [provider]    Physical Exam: Vitals:   06/19/20 0700 06/19/20 0715 06/19/20 0730 06/19/20 0745  BP: 108/60 108/67 114/61 112/61  Pulse: 74 76 74 77  Resp:      Temp:      TempSrc:      SpO2: 93% 92% 91% 93%  Weight:      Height:         Vitals:   06/19/20 0700 06/19/20 0715 06/19/20 0730 06/19/20 0745  BP: 108/60 108/67 114/61 112/61  Pulse: 74 76 74 77  Resp:      Temp:      TempSrc:      SpO2: 93% 92% 91% 93%  Weight:      Height:        Constitutional: NAD, alert and oriented x 3 Eyes: PERRL, lids and conjunctivae normal ENMT: Mucous  membranes are moist.  Neck: normal, supple, no masses, no thyromegaly Respiratory: clear to auscultation bilaterally, no wheezing, no crackles. Normal respiratory effort. No accessory muscle use.  Cardiovascular: Regular rate and rhythm, no murmurs / rubs / gallops. No extremity edema. 2+ pedal pulses. No carotid bruits.  Abdomen: Distended, mild periumbilical tenderness, no masses palpated. No hepatosplenomegaly. Bowel sounds hypoactive.  Musculoskeletal: no clubbing / cyanosis. No joint deformity upper and lower extremities.  Skin: no rashes, lesions, ulcers.  Neurologic: No gross focal neurologic deficit. Psychiatric: Normal mood and affect.   Labs on Admission: I have personally reviewed following labs and imaging studies  CBC: Recent Labs  Lab 06/13/20 1308 06/18/20 1757  WBC 9.0 10.9*  NEUTROABS 5.8  --   HGB 9.5* 9.9*  HCT 29.7* 31.0*  MCV 99.0 98.4  PLT 226 062*   Basic Metabolic Panel: Recent Labs  Lab 06/13/20 1308 06/18/20 1757  NA 137 135  K 4.4 5.7*  CL 96* 100  CO2 30 26  GLUCOSE 134* 203*  BUN 42* 49*  CREATININE 1.87* 1.45*  CALCIUM 8.7* 9.5   GFR: Estimated Creatinine Clearance: 30.7 mL/min (A) (by C-G formula based on SCr of 1.45 mg/dL (H)). Liver Function Tests: Recent Labs  Lab 06/13/20 1308 06/18/20 1757  AST 20 25  ALT 38 33  ALKPHOS 81 75  BILITOT 0.4 0.5  PROT 6.6 6.6  ALBUMIN 4.0 4.1   Recent Labs  Lab 06/18/20 1757  LIPASE 76*   No results for input(s): AMMONIA in the last 168 hours. Coagulation Profile: No results for input(s): INR, PROTIME in the last 168 hours. Cardiac Enzymes: No results for input(s): CKTOTAL, CKMB, CKMBINDEX, TROPONINI in the last 168 hours. BNP (last 3 results) No results for input(s): PROBNP in the last 8760 hours. HbA1C: No results for input(s): HGBA1C in the  last 72 hours. CBG: No results for input(s): GLUCAP in the last 168 hours. Lipid Profile: No results for input(s): CHOL, HDL, LDLCALC, TRIG,  CHOLHDL, LDLDIRECT in the last 72 hours. Thyroid Function Tests: No results for input(s): TSH, T4TOTAL, FREET4, T3FREE, THYROIDAB in the last 72 hours. Anemia Panel: No results for input(s): VITAMINB12, FOLATE, FERRITIN, TIBC, IRON, RETICCTPCT in the last 72 hours. Urine analysis:    Component Value Date/Time   COLORURINE YELLOW 09/04/2019 1800   APPEARANCEUR CLEAR 09/04/2019 1800   LABSPEC 1.015 09/04/2019 1800   PHURINE 7.5 09/04/2019 1800   GLUCOSEU NEGATIVE 09/04/2019 1800   HGBUR LARGE (A) 09/04/2019 1800   BILIRUBINUR NEGATIVE 09/04/2019 1800   KETONESUR NEGATIVE 09/04/2019 1800   PROTEINUR 30 (A) 09/04/2019 1800   NITRITE NEGATIVE 09/04/2019 1800   LEUKOCYTESUR SMALL (A) 09/04/2019 1800    Radiological Exams on Admission: CT ABDOMEN PELVIS W CONTRAST  Result Date: 06/18/2020 CLINICAL DATA:  Abdominal pain constipation x5 days. EXAM: CT ABDOMEN AND PELVIS WITH CONTRAST TECHNIQUE: Multidetector CT imaging of the abdomen and pelvis was performed using the standard protocol following bolus administration of intravenous contrast. CONTRAST:  69m OMNIPAQUE IOHEXOL 300 MG/ML  SOLN COMPARISON:  CT abdomen pelvis July 16, 2019 FINDINGS: Lower chest: Cardiomegaly. Trace pericardial fluid. Cardiac pacemaker wires noted in the right atrium and right ventricle. Slight blooming of the left ventricular apex with stable calcifications possibly reflecting sequela prior infarct. Hepatobiliary: No suspicious hepatic lesion. Prominent Riedel's lobe. Similar slight left lobe and caudate lobe hypertrophy. Cholecystectomy. Mild prominence of biliary tree, similar to prior likely reservoir effect. Pancreas: Unremarkable Spleen: Unremarkable Adrenals/Urinary Tract: Stable size of the low-attenuation bilateral adrenal masses, consistent with benign adenomas. Bilateral parenchymal scarring and moderate atrophy. No hydronephrosis. Bladder is predominantly decompressed limiting evaluation. No filling defects seen  in the collecting system or proximal ureters on delayed imaging. Stomach/Bowel: Stomach is within normal limits. No suspicious small bowel wall thickening. There is a moderate burden of formed stool throughout the ascending and transverse colon, with fecalization of some distal loops of small bowel. The descending colon, sigmoid colon and rectum are predominantly decompressed. Colonic diverticulosis. Numerous radiopaque tablets throughout the colon. Vascular/Lymphatic: Extensive aortic and branch vessel atherosclerosis. No enlarged abdominal or pelvic lymph nodes. Reproductive: Uterus and bilateral adnexa are unremarkable. Other: No abdominopelvic ascites Musculoskeletal: Right hip arthroplasty. No acute osseous abnormality. IMPRESSION: 1. Moderate burden of formed stool throughout the ascending and transverse colon with fecalization of some distal loops of small bowel, consistent with constipation with slow transit. No evidence of obstruction. 2. 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 Suggest correlation with liver function tests in hepatic serology if not recently performed. 3. Unchanged bilateral low-density adrenal lesions consistent with benign adrenal adenomas. 4. Cardiomegaly with trace pericardial fluid. 5. Aortic atherosclerosis. Aortic Atherosclerosis (ICD10-I70.0). Electronically Signed   By: JDahlia BailiffMD   On: 06/18/2020 23:13    EKG: Independently reviewed.   Assessment/Plan Principal Problem:   Constipation due to opioid therapy Active Problems:   CAD (coronary artery disease)   HTN (hypertension)   Type 2 diabetes mellitus with stage 3 chronic kidney disease (HCC)   COPD (chronic obstructive pulmonary disease) (HCC)   Hypothyroidism, acquired, autoimmune   Anemia in chronic kidney disease (CKD)   CKD (chronic kidney disease), stage III (HCC)   Multiple myeloma (HCC)   Hyperkalemia   Chronic respiratory failure (HCC)     Constipation due to opioid  therapy Patient presents to the emergency room for  evaluation of abdominal pain mostly in the periumbilical area, nausea and constipation. She is on opioids for cancer related pain Patient received multiple enemas and laxatives in the ER without any results We'll request GI consult     Diabetes mellitus with complications of stage III chronic kidney disease Place patient on a consistent carbohydrate diet Sliding scale insulin with Accu-Cheks before meals and at bedtime    Hyperkalemia Patient received a dose of Kayexalate in the ER without any results Hold spironolactone Hold oral potassium supplements Will start patient on [place].  Hypothyroidism Continue Synthroid    Depression Continue trazodone and Prozac    History of multiple myeloma On chemotherapy with Velcade and dexamethasone Follow-up with oncology as an outpatient    GERD Continue Carafate and Protonix    COPD with chronic respiratory failure Stable and not acutely exacerbated Continue as needed bronchodilator therapy  DVT prophylaxis: Lovenox Code Status: Full code Family Communication: Greater than 50% of time was spent discussing patient's condition and plan of care. All questions and concerns have been addressed. She verbalizes understanding and agrees with the plan. Disposition Plan: Back to previous home environment Consults called: Gastroenterology    Collier Bullock MD [facility]     06/19/2020, 9:25 AM

## 2020-06-19 NOTE — ED Notes (Signed)
Pt still unable to use restroom after mineral enema.

## 2020-06-19 NOTE — Progress Notes (Signed)
Re: Labs ordered.   Faythe Casa, NP 06/19/2020 5:35 AM

## 2020-06-19 NOTE — Progress Notes (Signed)
[place]  [phone] [phone]) [phone]  ID: Sherice Ijames Macomber OB: 12-Aug-1940  MR#: [id number]  TUU#:[id number]  [name], MD as PCP - General (Family Medicine) Lloyd Huger, MD as Consulting Physician (Oncology)  CHIEF COMPLAINT: Multiple myeloma.  INTERVAL HISTORY: Patient returns to clinic today for further evaluation and consideration of cycle 3, day 8 of single agent Velcade. She had significant constipation over the weekend requiring evaluation in the emergency room. She finally had a bowel movement and feels improved, but still has increased weakness and fatigue. She does not complain of hip pain today.  She has chronic shortness of breath and requires oxygen 24 hours/day. She has no neurologic complaints.  She denies any recent fevers.  She has a fair appetite.  She denies any chest pain, cough, or hemoptysis. She denies any nausea, vomiting, or diarrhea. She has no melena or hematochezia.  She has no urinary complaints. Patient offers no further specific complaints today.  REVIEW OF SYSTEMS:   Review of Systems  Constitutional: Positive for malaise/fatigue. Negative for fever.  Respiratory: Positive for shortness of breath. Negative for cough and hemoptysis.   Cardiovascular: Negative.  Negative for chest pain and leg swelling.  Gastrointestinal: Negative.  Negative for abdominal pain, diarrhea, melena and nausea.  Genitourinary: Negative.  Negative for dysuria and hematuria.  Musculoskeletal: Negative.  Negative for back pain and joint pain.  Skin: Negative.  Negative for rash.  Neurological: Positive for weakness. Negative for dizziness, focal weakness and headaches.  Psychiatric/Behavioral: Negative.  The patient is not nervous/anxious.     As per HPI. Otherwise, a complete review of systems is negative.  PAST MEDICAL HISTORY: Past Medical History:  Diagnosis Date  . Anxiety   . Chronic combined  systolic and diastolic CHF (congestive heart failure) ([place])   . Chronic kidney disease   . Coronary artery disease   . Depression   . Diabetes mellitus without complication ([place])   . Diabetes mellitus, type II ([place])   . Hypertension   . MI (myocardial infarction) ([place])    x 5  . Pacemaker   . 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[place])   . Prolonged Q-T interval on ECG   . Thyroid disease     PAST SURGICAL HISTORY: Past Surgical History:  Procedure Laterality Date  . CENTRAL LINE INSERTION  03/11/2017   Procedure: CENTRAL LINE INSERTION;  Surgeon: Leonie Man, MD;  Location: [place] [facility];  Service: Cardiovascular;;  . CHOLECYSTECTOMY    . COLONOSCOPY WITH PROPOFOL N/A 09/01/2019   Procedure: COLONOSCOPY WITH PROPOFOL;  Surgeon: Toledo, Benay Pike, MD;  Location: [facility];  Service: Gastroenterology;  Laterality: N/A;  . CORONARY STENT INTERVENTION W/IMPELLA N/A 03/11/2017   Procedure: Coronary Stent Intervention w/Impella;  Surgeon: Leonie Man, MD;  Location: [place] [facility];  Service: Cardiovascular;  Laterality: N/A;  . ESOPHAGOGASTRODUODENOSCOPY (EGD) WITH PROPOFOL N/A 09/01/2019   Procedure: ESOPHAGOGASTRODUODENOSCOPY (EGD) WITH PROPOFOL;  Surgeon: Toledo, Benay Pike, MD;  Location: [facility];  Service: Gastroenterology;  Laterality: N/A;  . ESOPHAGOGASTRODUODENOSCOPY (EGD) WITH PROPOFOL N/A 09/08/2019   Procedure: ESOPHAGOGASTRODUODENOSCOPY (EGD) WITH PROPOFOL;  Surgeon: [name], MD;  Location: [facility]COPY;  Service: Gastroenterology;  Laterality: N/A;  . EYE SURGERY    . HIP ARTHROPLASTY Right 03/29/2020   Procedure: ARTHROPLASTY BIPOLAR HIP (HEMIARTHROPLASTY);  Surgeon: Corky Mull, MD;  Location: [facility];  Service: Orthopedics;  Laterality: Right;  . INTRAVASCULAR PRESSURE WIRE/FFR STUDY N/A 03/11/2017  Procedure: INTRAVASCULAR PRESSURE WIRE/FFR STUDY;  Surgeon: Leonie Man, MD;  Location: [place] [facility];  Service: Cardiovascular;   Laterality: N/A;  . INTRAVASCULAR ULTRASOUND/IVUS N/A 03/11/2017   Procedure: Intravascular Ultrasound/IVUS;  Surgeon: Leonie Man, MD;  Location: [place] [facility];  Service: Cardiovascular;  Laterality: N/A;  . LEFT HEART CATH AND CORONARY ANGIOGRAPHY N/A 03/05/2017   Procedure: LEFT HEART CATH AND CORONARY ANGIOGRAPHY;  Surgeon: Isaias Cowman, MD;  Location: [place] [facility];  Service: Cardiovascular;  Laterality: N/A;  . PACEMAKER IMPLANT    . pacemaker/defibrillator Left     FAMILY HISTORY: Family History  Problem Relation Age of Onset  . Hypertension Father   . Heart attack Father   . Depression Sister   . Depression Brother   . Depression Brother     ADVANCED DIRECTIVES (Y/N):  N  HEALTH MAINTENANCE: Social History   Tobacco Use  . Smoking status: Current Every Day Smoker    Packs/day: 0.25    Types: E-cigarettes, Cigarettes  . Smokeless tobacco: Never Used  Vaping Use  . Vaping Use: Former  Substance Use Topics  . Alcohol use: Not Currently    Comment: occasionally  . Drug use: Yes    Comment: prescribed pain meds     Colonoscopy:  PAP:  Bone density:  Lipid panel:  Allergies  Allergen Reactions  . Celebrex [Celecoxib] Anaphylaxis  . Glipizide Anaphylaxis  . Levaquin [Levofloxacin In D5w] Other (See Comments)    Heart arrhthymias  . Levofloxacin Other (See Comments) and Palpitations    ICD fired  . Lisinopril Swelling    Lip and facial swelling  . Sulfa Antibiotics Other (See Comments) and Anaphylaxis    Reaction: unknown  . Metformin Other (See Comments)    Gi tolerance   . Penicillins Rash and Other (See Comments)    Has patient had a PCN reaction causing immediate rash, facial/tongue/throat swelling, SOB or lightheadedness with hypotension: Unknown Has patient had a PCN reaction causing severe rash involving mucus membranes or skin necrosis: No Has patient had a PCN reaction that required hospitalization: No Has patient had a  PCN reaction occurring within the last 10 years: No If all of the above answers are "NO", then may proceed with Cephalosporin use.     Current Outpatient Medications  Medication Sig Dispense Refill  . allopurinol (ZYLOPRIM) 100 MG tablet Take 50 mg by mouth daily.    Marland Kitchen aspirin EC 81 MG tablet Take 81 mg by mouth at bedtime.     . chlorpheniramine-HYDROcodone (TUSSIONEX) 10-8 MG/5ML SUER Take 5 mLs by mouth every 12 (twelve) hours as needed for cough. 140 mL 0  . colchicine 0.6 MG tablet Take 0.6 mg by mouth 2 (two) times daily.    Marland Kitchen dexamethasone (DECADRON) 4 MG tablet Take 5 tablets (20 mg total) by mouth daily. Take the day after Velcade on days 2,5,9,12. Take with breakfast 40 tablet 3  . Ensure Max Protein (ENSURE MAX PROTEIN) LIQD Take 330 mLs (11 oz total) by mouth 2 (two) times daily between meals.    . fexofenadine (ALLEGRA) 180 MG tablet Take 180 mg by mouth daily.    Marland Kitchen FLUoxetine (PROZAC) 10 MG capsule Take 10 mg by mouth daily.    . folic acid (FOLVITE) 1 MG tablet folic acid 1 mg tablet    . hydrocortisone (ANUSOL-HC) 2.5 % rectal cream Apply 1 application topically 2 (two) times daily.    Marland Kitchen levothyroxine (SYNTHROID) 75 MCG tablet Take 1  tablet (75 mcg total) by mouth daily before breakfast. On Monday, Wednesday and Friday    . magnesium oxide (MAG-OX) 400 MG tablet Take 200 mg by mouth daily.    Marland Kitchen mexiletine (MEXITIL) 200 MG capsule Take 1 capsule (200 mg total) by mouth every 12 (twelve) hours. 60 capsule 1  . midodrine (PROAMATINE) 10 MG tablet Take 1 tablet (10 mg total) by mouth 2 (two) times daily with a meal.    . OXYGEN Inhale 2 L into the lungs continuous.    . pantoprazole (PROTONIX) 40 MG tablet Take 40 mg by mouth daily.     . polyethylene glycol (MIRALAX) 17 g packet Take 17 g by mouth daily. 14 each 0  . Prenatal Vit-Fe Fumarate-FA (PRENATAL MULTIVITAMIN) TABS tablet Take 1 tablet by mouth daily.    . simvastatin (ZOCOR) 40 MG tablet Take 20 mg by mouth at bedtime.    2  . spironolactone (ALDACTONE) 25 MG tablet Take 25 mg by mouth daily.     . sucralfate (CARAFATE) 1 g tablet Take 1 tablet by mouth 3 (three) times daily before meals.    . torsemide (DEMADEX) 20 MG tablet Take 2 tablets (40 mg total) by mouth daily. Future refills need to come from kidney doctor 60 tablet 1  . traMADol (ULTRAM) 50 MG tablet Take 1 tablet (50 mg total) by mouth every 6 (six) hours as needed for moderate pain. 30 tablet 0  . traZODone (DESYREL) 50 MG tablet Take 50 mg by mouth at bedtime.     No current facility-administered medications for this visit.    OBJECTIVE: Vitals:   06/20/20 1350  BP: 102/60  Pulse: 71  Resp: 18  Temp: (!) 96.2 F (35.7 C)  SpO2: 100%     Body mass index is 27.86 kg/m.    ECOG FS:1 - Symptomatic but completely ambulatory  General: Well-developed, well-nourished, no acute distress. Eyes: Pink conjunctiva, anicteric sclera. HEENT: Normocephalic, moist mucous membranes. Lungs: No audible wheezing or coughing. Heart: Regular rate and rhythm. Abdomen: Soft, nontender, no obvious distention. Musculoskeletal: No edema, cyanosis, or clubbing. Neuro: Alert, answering all questions appropriately. Cranial nerves grossly intact. Skin: No rashes or petechiae noted. Psych: Normal affect.   LAB RESULTS:  Lab Results  Component Value Date   NA 134 (L) 06/20/2020   K 3.9 06/20/2020   CL 94 (L) 06/20/2020   CO2 30 06/20/2020   GLUCOSE 200 (H) 06/20/2020   BUN 35 (H) 06/20/2020   CREATININE 1.34 (H) 06/20/2020   CALCIUM 8.6 (L) 06/20/2020   PROT 6.0 (L) 06/20/2020   ALBUMIN 3.6 06/20/2020   AST 37 06/20/2020   ALT 36 06/20/2020   ALKPHOS 74 06/20/2020   BILITOT 0.6 06/20/2020   GFRNONAA 41 (L) 06/20/2020   GFRAA 30 (L) 09/09/2019    Lab Results  Component Value Date   WBC 8.3 06/20/2020   NEUTROABS 5.4 06/20/2020   HGB 9.6 (L) 06/20/2020   HCT 29.7 (L) 06/20/2020   MCV 96.4 06/20/2020   PLT 109 (L) 06/20/2020   Lab Results   Component Value Date   IRON 85 05/23/2020   TIBC 421 05/23/2020   IRONPCTSAT 20 05/23/2020   Lab Results  Component Value Date   FERRITIN 50 05/23/2020     STUDIES: CT ABDOMEN PELVIS W CONTRAST  Result Date: 06/18/2020 CLINICAL DATA:  Abdominal pain constipation x5 days. EXAM: CT ABDOMEN AND PELVIS WITH CONTRAST TECHNIQUE: Multidetector CT imaging of the abdomen and pelvis was performed using  the standard protocol following bolus administration of intravenous contrast. CONTRAST:  44m OMNIPAQUE IOHEXOL 300 MG/ML  SOLN COMPARISON:  CT abdomen pelvis July 16, 2019 FINDINGS: Lower chest: Cardiomegaly. Trace pericardial fluid. Cardiac pacemaker wires noted in the right atrium and right ventricle. Slight blooming of the left ventricular apex with stable calcifications possibly reflecting sequela prior infarct. Hepatobiliary: No suspicious hepatic lesion. Prominent Riedel's lobe. Similar slight left lobe and caudate lobe hypertrophy. Cholecystectomy. Mild prominence of biliary tree, similar to prior likely reservoir effect. Pancreas: Unremarkable Spleen: Unremarkable Adrenals/Urinary Tract: Stable size of the low-attenuation bilateral adrenal masses, consistent with benign adenomas. Bilateral parenchymal scarring and moderate atrophy. No hydronephrosis. Bladder is predominantly decompressed limiting evaluation. No filling defects seen in the collecting system or proximal ureters on delayed imaging. Stomach/Bowel: Stomach is within normal limits. No suspicious small bowel wall thickening. There is a moderate burden of formed stool throughout the ascending and transverse colon, with fecalization of some distal loops of small bowel. The descending colon, sigmoid colon and rectum are predominantly decompressed. Colonic diverticulosis. Numerous radiopaque tablets throughout the colon. Vascular/Lymphatic: Extensive aortic and branch vessel atherosclerosis. No enlarged abdominal or pelvic lymph nodes.  Reproductive: Uterus and bilateral adnexa are unremarkable. Other: No abdominopelvic ascites Musculoskeletal: Right hip arthroplasty. No acute osseous abnormality. IMPRESSION: 1. Moderate burden of formed stool throughout the ascending and transverse colon with fecalization of some distal loops of small bowel, consistent with constipation with slow transit. No evidence of obstruction. 2. 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 Suggest correlation with liver function tests in hepatic serology if not recently performed. 3. Unchanged bilateral low-density adrenal lesions consistent with benign adrenal adenomas. 4. Cardiomegaly with trace pericardial fluid. 5. Aortic atherosclerosis. Aortic Atherosclerosis (ICD10-I70.0). Electronically Signed   By: JDahlia BailiffMD   On: 06/18/2020 23:13    ASSESSMENT: Multiple myeloma, fractured right hip.  PLAN:    1.  Multiple myeloma: Bone marrow biopsy confirmed diagnosis with plasma cells up to 90% of biopsy.  Cytogenetics are reported as normal.  Metastatic bone survey from March 03, 2020 reviewed independently with multiple skeletal lucencies consistent with myeloma. Her M spike was initially 1.2, but now has trended down to 0.1. IgA levels have decreased from 2773 to 302.  Kappa and lambda free light chains are within normal limits.  Patient noted to have endorgan damage of renal insufficiency as well as persistent anemia. Initial plan was to give Velcade on days 1, 4, 8, and 11 along with 10 mg Revlimid on days 1 through 14. Patient will also receive weekly 20 mg dexamethasone.  Revlimid has been discontinued and will proceed with Velcade and dexamethasone only. Proceed with cycle 3, day 8 of treatment today. Return to clinic Friday for Velcade only and then in 2 weeks for further evaluation and consideration of cycle 4, day 1.   2.  Anemia: Chronic and unchanged. Patient's hemoglobin is 9.6 today.  Iron stores are within normal limits.  Likely  multifactorial with chronic renal insufficiency, underlying myeloma, as well as history of recent GI bleed.  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  Monitor. 3.  Chronic renal insufficiency: Continues to improve and creatinine is now 1.34.  Velcade does not need to be dosed reduced and renal dysfunction.  Monitor.   4.  Ascites: Patient does not complain of this today.  Continue follow-up with heart failure clinic as scheduled. 5.  Angiodysplastic lesions: Continue follow-up with GI as scheduled. 6. Constipation:  Resolved. Continue stool softeners and MiraLAX as Monday. 7.  Right hip fracture: Improving.  Continue rehab and follow-up with orthopedics as scheduled. Continue Percocet as needed. 8. Thrombocytopenia: Mild. Proceed with treatment as above.  Patient expressed understanding and was in agreement with this plan. She also understands that She can call clinic at any time with any questions, concerns, or complaints.    Lloyd Huger, MD   06/21/2020 6:59 AM

## 2020-06-19 NOTE — Discharge Summary (Signed)
Physician Discharge Summary  Heather Peterson [id number] DOB: May 08, 1941 DOA: 06/18/2020  PCP: [name], MD  Admit date: 06/18/2020 Discharge date: 06/19/2020  Admitted From: Home Disposition:  Home   Recommendations for Outpatient Follow-up:  1. Follow up with PCP in 1-2 weeks 2. Please obtain BMP/CBC in one week 3. Please follow up on the following pending results:  Home Health:NO Equipment/Devices: None   Discharge Condition:Stable CODE STATUS: FULL Diet recommendation: Carb Modified    Brief/Interim Summary: Heather Peterson is a 80 y.o. female with medical history significant for multiple myeloma on chemotherapy, diabetes mellitus with complications of chronic kidney disease, coronary artery disease, depression, hypertension, chronic combined systolic and diastolic dysfunction CHF who presented to the ER for evaluation of abdominal pain mostly in the periumbilical area and rated it a 5 x 10 in intensity at its worst. Pain was constant, nonradiating and was associated with nausea but no vomiting. Patient has a sensation of feeling full. She has not had a bowel movement in about 5 days which is unusual for her and is on opiates for cancer related pain. Patient received multiple laxatives and enemas in the ER without any results and so she was referred to observation status for further evaluation.  She also had hyperkalemia and was treated with Lokelma and Kayexalate. Patient has had a bowel movement with improvement in her symptoms and was seen by GI who recommends daily MiraLAX and high-fiber diet.  Patient will be discharged home to follow-up with her primary care     Discharge Diagnoses:  Principal Problem:   Constipation due to opioid therapy Active Problems:   CAD (coronary artery disease)   HTN (hypertension)   Type 2 diabetes mellitus with stage 3 chronic kidney disease (HCC)   COPD (chronic obstructive pulmonary disease) (HCC)   Hypothyroidism, acquired,  autoimmune   Anemia in chronic kidney disease (CKD)   CKD (chronic kidney disease), stage III (HCC)   Multiple myeloma (HCC)   Hyperkalemia   Chronic respiratory failure (HCC)    Discharge Instructions  Discharge Instructions    Diet - low sodium heart healthy   Complete by: As directed    Diet Carb Modified   Complete by: As directed    Increase activity slowly   Complete by: As directed      Allergies as of 06/19/2020      Reactions   Celebrex [celecoxib] Anaphylaxis   Glipizide Anaphylaxis   Levaquin [levofloxacin In D5w] Other (See Comments)   Heart arrhthymias   Levofloxacin Other (See Comments), Palpitations   ICD fired   Lisinopril Swelling   Lip and facial swelling   Sulfa Antibiotics Other (See Comments), Anaphylaxis   Reaction: unknown   Metformin Other (See Comments)   Gi tolerance    Penicillins Rash, Other (See Comments)   Has patient had a PCN reaction causing immediate rash, facial/tongue/throat swelling, SOB or lightheadedness with hypotension: Unknown Has patient had a PCN reaction causing severe rash involving mucus membranes or skin necrosis: No Has patient had a PCN reaction that required hospitalization: No Has patient had a PCN reaction occurring within the last 10 years: No If all of the above answers are "NO", then may proceed with Cephalosporin use.      Medication List    STOP taking these medications   docusate sodium 100 MG capsule Commonly known as: COLACE   HYDROcodone-acetaminophen 5-325 MG tablet Commonly known as: NORCO/VICODIN   Lantus SoloStar 100 UNIT/ML Solostar Pen Generic drug: insulin  glargine   lenalidomide 10 MG capsule Commonly known as: REVLIMID   lidocaine 5 % Commonly known as: LIDODERM   nystatin cream Commonly known as: MYCOSTATIN   ondansetron 8 MG tablet Commonly known as: Zofran   potassium chloride SA 20 MEQ tablet Commonly known as: KLOR-CON   prochlorperazine 10 MG tablet Commonly known as:  COMPAZINE     TAKE these medications   allopurinol 100 MG tablet Commonly known as: ZYLOPRIM Take 50 mg by mouth daily.   aspirin EC 81 MG tablet Take 81 mg by mouth at bedtime.   chlorpheniramine-HYDROcodone 10-8 MG/5ML Suer Commonly known as: TUSSIONEX Take 5 mLs by mouth every 12 (twelve) hours as needed for cough.   colchicine 0.6 MG tablet Take 0.6 mg by mouth 2 (two) times daily.   dexamethasone 4 MG tablet Commonly known as: DECADRON Take 5 tablets (20 mg total) by mouth daily. Take the day after Velcade on days 2,5,9,12. Take with breakfast   Ensure Max Protein Liqd Take 330 mLs (11 oz total) by mouth 2 (two) times daily between meals.   fexofenadine 180 MG tablet Commonly known as: ALLEGRA Take 180 mg by mouth daily.   FLUoxetine 10 MG capsule Commonly known as: PROZAC Take 10 mg by mouth daily.   folic acid 1 MG tablet Commonly known as: FOLVITE folic acid 1 mg tablet   hydrocortisone 2.5 % rectal cream Commonly known as: ANUSOL-HC Apply 1 application topically 2 (two) times daily.   levothyroxine 75 MCG tablet Commonly known as: SYNTHROID Take 1 tablet (75 mcg total) by mouth daily before breakfast. On Monday, Wednesday and Friday What changed: Another medication with the same name was removed. Continue taking this medication, and follow the directions you see here.   magnesium oxide 400 MG tablet Commonly known as: MAG-OX Take 200 mg by mouth daily.   mexiletine 200 MG capsule Commonly known as: MEXITIL Take 1 capsule (200 mg total) by mouth every 12 (twelve) hours.   midodrine 10 MG tablet Commonly known as: PROAMATINE Take 1 tablet (10 mg total) by mouth 2 (two) times daily with a meal.   OXYGEN Inhale 2 L into the lungs continuous.   pantoprazole 40 MG tablet Commonly known as: PROTONIX Take 40 mg by mouth daily.   polyethylene glycol 17 g packet Commonly known as: MiraLax Take 17 g by mouth daily.   prenatal multivitamin Tabs  tablet Take 1 tablet by mouth daily.   simvastatin 40 MG tablet Commonly known as: ZOCOR Take 20 mg by mouth at bedtime.   spironolactone 25 MG tablet Commonly known as: ALDACTONE Take 25 mg by mouth daily.   sucralfate 1 g tablet Commonly known as: CARAFATE Take 1 tablet by mouth 3 (three) times daily before meals.   torsemide 20 MG tablet Commonly known as: DEMADEX Take 2 tablets (40 mg total) by mouth daily. Future refills need to come from kidney doctor   traMADol 50 MG tablet Commonly known as: ULTRAM Take 1 tablet (50 mg total) by mouth every 6 (six) hours as needed for moderate pain.   traZODone 50 MG tablet Commonly known as: DESYREL Take 50 mg by mouth at bedtime.     ASK your doctor about these medications   magnesium citrate Soln Take 296 mLs (1 Bottle total) by mouth once for 1 dose. Ask about: Should I take this medication?       Follow-up Information    Feldpausch, Chrissie Noa, MD.   Specialty: [facility] Contact  information: [employer] [place] [address] [phone]              Allergies  Allergen Reactions  . Celebrex [Celecoxib] Anaphylaxis  . Glipizide Anaphylaxis  . Levaquin [Levofloxacin In D5w] Other (See Comments)    Heart arrhthymias  . Levofloxacin Other (See Comments) and Palpitations    ICD fired  . Lisinopril Swelling    Lip and facial swelling  . Sulfa Antibiotics Other (See Comments) and Anaphylaxis    Reaction: unknown  . Metformin Other (See Comments)    Gi tolerance   . Penicillins Rash and Other (See Comments)    Has patient had a PCN reaction causing immediate rash, facial/tongue/throat swelling, SOB or lightheadedness with hypotension: Unknown Has patient had a PCN reaction causing severe rash involving mucus membranes or skin necrosis: No Has patient had a PCN reaction that required hospitalization: No Has patient had a PCN reaction occurring within the last 10 years: No If all of the above answers are  "NO", then may proceed with Cephalosporin use.     Consultations:  Gastroenterology   Procedures/Studies: CT ABDOMEN PELVIS W CONTRAST  Result Date: 06/18/2020 CLINICAL DATA:  Abdominal pain constipation x5 days. EXAM: CT ABDOMEN AND PELVIS WITH CONTRAST TECHNIQUE: Multidetector CT imaging of the abdomen and pelvis was performed using the standard protocol following bolus administration of intravenous contrast. CONTRAST:  60m OMNIPAQUE IOHEXOL 300 MG/ML  SOLN COMPARISON:  CT abdomen pelvis July 16, 2019 FINDINGS: Lower chest: Cardiomegaly. Trace pericardial fluid. Cardiac pacemaker wires noted in the right atrium and right ventricle. Slight blooming of the left ventricular apex with stable calcifications possibly reflecting sequela prior infarct. Hepatobiliary: No suspicious hepatic lesion. Prominent Riedel's lobe. Similar slight left lobe and caudate lobe hypertrophy. Cholecystectomy. Mild prominence of biliary tree, similar to prior likely reservoir effect. Pancreas: Unremarkable Spleen: Unremarkable Adrenals/Urinary Tract: Stable size of the low-attenuation bilateral adrenal masses, consistent with benign adenomas. Bilateral parenchymal scarring and moderate atrophy. No hydronephrosis. Bladder is predominantly decompressed limiting evaluation. No filling defects seen in the collecting system or proximal ureters on delayed imaging. Stomach/Bowel: Stomach is within normal limits. No suspicious small bowel wall thickening. There is a moderate burden of formed stool throughout the ascending and transverse colon, with fecalization of some distal loops of small bowel. The descending colon, sigmoid colon and rectum are predominantly decompressed. Colonic diverticulosis. Numerous radiopaque tablets throughout the colon. Vascular/Lymphatic: Extensive aortic and branch vessel atherosclerosis. No enlarged abdominal or pelvic lymph nodes. Reproductive: Uterus and bilateral adnexa are unremarkable. Other: No  abdominopelvic ascites Musculoskeletal: Right hip arthroplasty. No acute osseous abnormality. IMPRESSION: 1. Moderate burden of formed stool throughout the ascending and transverse colon with fecalization of some distal loops of small bowel, consistent with constipation with slow transit. No evidence of obstruction. 2. 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 Suggest correlation with liver function tests in hepatic serology if not recently performed. 3. Unchanged bilateral low-density adrenal lesions consistent with benign adrenal adenomas. 4. Cardiomegaly with trace pericardial fluid. 5. Aortic atherosclerosis. Aortic Atherosclerosis (ICD10-I70.0). Electronically Signed   By: JDahlia BailiffMD   On: 06/18/2020 23:13       Subjective:   Discharge Exam: Vitals:   06/19/20 1011 06/19/20 1017  BP: 106/69 106/69  Pulse: 72 74  Resp: 18 18  Temp: 97.6 F (36.4 C) 97.6 F (36.4 C)  SpO2: 97% 95%   Vitals:   06/19/20 0830 06/19/20 0900 06/19/20 1011 06/19/20 1017  BP: 116/68 101/60 106/69 106/69  Pulse: 78 79 72 74  Resp:   18 18  Temp:   97.6 F (36.4 C) 97.6 F (36.4 C)  TempSrc:   Oral Oral  SpO2: 93% 95% 97% 95%  Weight:      Height:        General: Pt is alert, awake, not in acute distress Cardiovascular: RRR, S1/S2 +, no rubs, no gallops Respiratory: CTA bilaterally, no wheezing, no rhonchi Abdominal: Soft, NT, ND, bowel sounds + Extremities: no edema, no cyanosis    The results of significant diagnostics from this hospitalization (including imaging, microbiology, ancillary and laboratory) are listed below for reference.     Microbiology: Recent Results (from the past 240 hour(s))  SARS Coronavirus 2 by RT PCR (hospital order, performed in [facility] hospital lab) Nasopharyngeal Nasopharyngeal Swab     Status: None   Collection Time: 06/19/20  6:44 AM   Specimen: Nasopharyngeal Swab  Result Value Ref Range Status   SARS Coronavirus 2 NEGATIVE NEGATIVE  Final    Comment: (NOTE) SARS-CoV-2 target nucleic acids are NOT DETECTED.  The SARS-CoV-2 RNA is generally detectable in upper and lower respiratory specimens during the acute phase of infection. The lowest concentration of SARS-CoV-2 viral copies this assay can detect is 250 copies / mL. A negative result does not preclude SARS-CoV-2 infection and should not be used as the sole basis for treatment or other patient management decisions.  A negative result may occur with improper specimen collection / handling, submission of specimen other than nasopharyngeal swab, presence of viral mutation(s) within the areas targeted by this assay, and inadequate number of viral copies (<250 copies / mL). A negative result must be combined with clinical observations, patient history, and epidemiological information.  Fact Sheet for Patients:   StrictlyIdeas.no  Fact Sheet for Healthcare Providers: BankingDealers.co.za  This test is not yet approved or  cleared by the [place] FDA and has been authorized for detection and/or diagnosis of SARS-CoV-2 by FDA under an Emergency Use Authorization (EUA).  This EUA will remain in effect (meaning this test can be used) for the duration of the COVID-19 declaration under Section 564(b)(1) of the Act, 21 U.S.C. section 360bbb-3(b)(1), unless the authorization is terminated or revoked sooner.  Performed at [facility], [address] [place], [place] [address]      Labs: BNP (last 3 results) Recent Labs    08/27/19 1848 09/04/19 1759 03/28/20 1435  BNP 714.0* 440.0* 335.4*   Basic Metabolic Panel: Recent Labs  Lab 06/13/20 1308 06/18/20 1757  NA 137 135  K 4.4 5.7*  CL 96* 100  CO2 30 26  GLUCOSE 134* 203*  BUN 42* 49*  CREATININE 1.87* 1.45*  CALCIUM 8.7* 9.5   Liver Function Tests: Recent Labs  Lab 06/13/20 1308 06/18/20 1757  AST 20 25  ALT 38 33  ALKPHOS 81 75   BILITOT 0.4 0.5  PROT 6.6 6.6  ALBUMIN 4.0 4.1   Recent Labs  Lab 06/18/20 1757  LIPASE 76*   No results for input(s): AMMONIA in the last 168 hours. CBC: Recent Labs  Lab 06/13/20 1308 06/18/20 1757  WBC 9.0 10.9*  NEUTROABS 5.8  --   HGB 9.5* 9.9*  HCT 29.7* 31.0*  MCV 99.0 98.4  PLT 226 147*   Cardiac Enzymes: No results for input(s): CKTOTAL, CKMB, CKMBINDEX, TROPONINI in the last 168 hours. BNP: Invalid input(s): POCBNP CBG: Recent Labs  Lab 06/19/20 1141  GLUCAP 111*   D-Dimer No results for  input(s): DDIMER in the last 72 hours. Hgb A1c Recent Labs    06/18/20 1757  HGBA1C 6.0*   Lipid Profile No results for input(s): CHOL, HDL, LDLCALC, TRIG, CHOLHDL, LDLDIRECT in the last 72 hours. Thyroid function studies No results for input(s): TSH, T4TOTAL, T3FREE, THYROIDAB in the last 72 hours.  Invalid input(s): FREET3 Anemia work up No results for input(s): VITAMINB12, FOLATE, FERRITIN, TIBC, IRON, RETICCTPCT in the last 72 hours. Urinalysis    Component Value Date/Time   COLORURINE YELLOW 09/04/2019 1800   APPEARANCEUR CLEAR 09/04/2019 1800   LABSPEC 1.015 09/04/2019 1800   PHURINE 7.5 09/04/2019 1800   GLUCOSEU NEGATIVE 09/04/2019 1800   HGBUR LARGE (A) 09/04/2019 1800   BILIRUBINUR NEGATIVE 09/04/2019 1800   KETONESUR NEGATIVE 09/04/2019 1800   PROTEINUR 30 (A) 09/04/2019 1800   NITRITE NEGATIVE 09/04/2019 1800   LEUKOCYTESUR SMALL (A) 09/04/2019 1800   Sepsis Labs Invalid input(s): PROCALCITONIN,  WBC,  LACTICIDVEN Microbiology Recent Results (from the past 240 hour(s))  SARS Coronavirus 2 by RT PCR (hospital order, performed in [facility] hospital lab) Nasopharyngeal Nasopharyngeal Swab     Status: None   Collection Time: 06/19/20  6:44 AM   Specimen: Nasopharyngeal Swab  Result Value Ref Range Status   SARS Coronavirus 2 NEGATIVE NEGATIVE Final    Comment: (NOTE) SARS-CoV-2 target nucleic acids are NOT DETECTED.  The SARS-CoV-2 RNA  is generally detectable in upper and lower respiratory specimens during the acute phase of infection. The lowest concentration of SARS-CoV-2 viral copies this assay can detect is 250 copies / mL. A negative result does not preclude SARS-CoV-2 infection and should not be used as the sole basis for treatment or other patient management decisions.  A negative result may occur with improper specimen collection / handling, submission of specimen other than nasopharyngeal swab, presence of viral mutation(s) within the areas targeted by this assay, and inadequate number of viral copies (<250 copies / mL). A negative result must be combined with clinical observations, patient history, and epidemiological information.  Fact Sheet for Patients:   StrictlyIdeas.no  Fact Sheet for Healthcare Providers: BankingDealers.co.za  This test is not yet approved or  cleared by the [place] FDA and has been authorized for detection and/or diagnosis of SARS-CoV-2 by FDA under an Emergency Use Authorization (EUA).  This EUA will remain in effect (meaning this test can be used) for the duration of the COVID-19 declaration under Section 564(b)(1) of the Act, 21 U.S.C. section 360bbb-3(b)(1), unless the authorization is terminated or revoked sooner.  Performed at [facility], [address] [place], [place] [address]      Time coordinating discharge: Over 30 minutes  SIGNED:   Collier Bullock, MD  [facility] 06/19/2020, 3:33 PM Pager   If 7PM-7AM, please contact night-coverage [email or web address] Password [email or web address]

## 2020-06-19 NOTE — ED Notes (Signed)
Pt oob to toilet without assist and without results.

## 2020-06-19 NOTE — ED Notes (Signed)
No bowel movement produced with soap sud enema.

## 2020-06-19 NOTE — ED Notes (Signed)
Pt reports some gas movement

## 2020-06-19 NOTE — ED Provider Notes (Signed)
-----------------------------------------   1:33 AM on 06/19/2020 -----------------------------------------  No relief with soapsuds enema.  Will try mineral oil enema.   ----------------------------------------- 2:22 AM on 06/19/2020 -----------------------------------------  Noted patient is on opioids for cancer pain.  We will also add Relistor.  Potassium noted 5.7.  Will give dose of Kayexalate.  Patient just received mineral oil enema.  Will continue to monitor.  ----------------------------------------- 5:16 AM on 06/19/2020 -----------------------------------------  No relief after all the above medications. Will discuss with hospitalist services for admission.   Paulette Blanch, MD 06/19/20 [phone]

## 2020-06-19 NOTE — Consult Note (Signed)
Consultation  Referring Provider:     Dr Francine Graven Admit date 06/19/20 Consult date  06/19/20       Reason for Consultation:     constipation         HPI:   Heather Peterson is a 80 y.o. 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 States this happened about a month ago that she was able to manage at home with colace/miralax. This particular episode started 5d ago. Took some mom/colace/miralax at home but these did not work. States she has been trying to cut down on her chronic narcotics. Has been taking colace daily but miralax every few days. Not taking any fiber.  Has had some associated nausea but no vomiting. Denies any rectal bleeding, melena. Has had some acid reflux - states she ran out of her pantoprazole.  Received several treatments while in ED- included mineral oil enema, Relistor injection, Kayexalate, lactulose, magnesium citrate and soapsuds enema- did not defecate afterwards so was admitted for observation. GI was consulted. dnei  Last seen in GI clinic 03/06/20- [place] screen arranged and no hepatoma.  Last seen by oncologist 06/13/20 and feel;ing ok- no constipation at that time. States her last velcade was Friday. CT scan this visit demonstrating constipation and no liver lesions. There was no ascites. Labs this visit with stable anemia, 9.9, minimal thrombocytopenia. There is some CRI. Lipase elevated at 76 without any imaging findings of pancreatitis. Troponin elevated, both may be reflective of her renal insufficiency.  PREVIOUS ENDOSCOPIES:            EGD - Dr Vicente Males 09/10/19 demonstrating a normal esophagus and duodenum. There were multiple nonbleeding avms in the stomach that were treated with  argon plasma coagulation. No specimens collected. EGD 09/01/19- Dr Alice Reichert- esophagitis, gastritis, normal duodenum. There was reflux esophagitis but no barretts/dysplasia, malignancy.  Colonoscopy 09/01/19- 3 adenomatous polyps from the transverse/descending colon Barium swallow 02/22/19 - mild esophageal dysmotility and mild reflux. There were no strictures or lesions Also had colonoscopy 2018 or 2019 done in [place] -evidently this was Dr Levada Dy. I don't have this report. Has history of adenomatous polyps- my last colonoscopy report is from 2014 with hyperplastic polyps and negative random colon biopsies   Paracentesis: 08/06/19- no evidence of SBP or malignancy.Cultures negative. SAAG indicates portal hypertension. Do note that her total protein was 4.5 indicatingalso acardiac contribution to her ascites. She has never had any hospitalization of problems from HE. No history of jaundice.     Past Medical History:  Diagnosis Date  . Anxiety   . Chronic combined systolic and diastolic CHF (congestive heart failure) ([place])   . Chronic kidney disease   . Coronary artery disease   . Depression   . Diabetes mellitus without complication ([place])   . Diabetes mellitus, type II ([place])   . Hypertension   . MI (myocardial infarction) ([place])    x 5  . Pacemaker   . 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[place])   . Prolonged Q-T interval on ECG   . Thyroid disease     Past Surgical History:  Procedure Laterality Date  . CENTRAL LINE INSERTION  03/11/2017   Procedure: CENTRAL LINE INSERTION;  Surgeon: Leonie Man, MD;  Location: [facility];  Service: Cardiovascular;;  . CHOLECYSTECTOMY    .  COLONOSCOPY WITH PROPOFOL N/A 09/01/2019   Procedure: COLONOSCOPY WITH PROPOFOL;  Surgeon: Toledo, Benay Pike, MD;  Location: [facility];  Service: Gastroenterology;  Laterality: N/A;  . CORONARY STENT INTERVENTION W/IMPELLA N/A 03/11/2017   Procedure: Coronary Stent Intervention w/Impella;  Surgeon: Leonie Man, MD;  Location: [facility];  Service: Cardiovascular;  Laterality: N/A;  . ESOPHAGOGASTRODUODENOSCOPY (EGD) WITH PROPOFOL N/A 09/01/2019   Procedure: ESOPHAGOGASTRODUODENOSCOPY (EGD) WITH PROPOFOL;  Surgeon: Toledo, Benay Pike, MD;  Location: [facility];  Service: Gastroenterology;  Laterality: N/A;  . ESOPHAGOGASTRODUODENOSCOPY (EGD) WITH PROPOFOL N/A 09/08/2019   Procedure: ESOPHAGOGASTRODUODENOSCOPY (EGD) WITH PROPOFOL;  Surgeon: [name], MD;  Location: [facility];  Service: Gastroenterology;  Laterality: N/A;  . EYE SURGERY    . HIP ARTHROPLASTY Right 03/29/2020   Procedure: ARTHROPLASTY BIPOLAR HIP (HEMIARTHROPLASTY);  Surgeon: Corky Mull, MD;  Location: [facility];  Service: Orthopedics;  Laterality: Right;  . INTRAVASCULAR PRESSURE WIRE/FFR STUDY N/A 03/11/2017   Procedure: INTRAVASCULAR PRESSURE WIRE/FFR STUDY;  Surgeon: Leonie Man, MD;  Location: [place];  Service: Cardiovascular;  Laterality: N/A;  . INTRAVASCULAR ULTRASOUND/IVUS N/A 03/11/2017   Procedure: Intravascular Ultrasound/IVUS;  Surgeon: Leonie Man, MD;  Location: [facility];  Service: Cardiovascular;  Laterality: N/A;  . LEFT HEART CATH AND CORONARY ANGIOGRAPHY N/A 03/05/2017   Procedure: LEFT HEART CATH AND CORONARY ANGIOGRAPHY;  Surgeon: Isaias Cowman, MD;  Location: [facility];  Service: Cardiovascular;  Laterality: N/A;  . PACEMAKER IMPLANT    . pacemaker/defibrillator Left     Family History  Problem Relation Age of Onset  . Hypertension Father   . Heart attack Father   . Depression Sister   . Depression Brother   . Depression Brother     Social History   Tobacco Use  . Smoking status: Current Every Day Smoker    Packs/day: 0.25    Types: E-cigarettes, Cigarettes  . Smokeless tobacco: Never Used  Vaping Use  . Vaping Use: Former  Substance Use Topics  . Alcohol use: Not Currently    Comment: occasionally  . Drug use: Yes     Comment: prescribed pain meds    Prior to Admission medications   Medication Sig Start Date End Date Taking? Authorizing Provider  polyethylene glycol (MIRALAX) 17 g packet Take 17 g by mouth daily. 06/18/20  Yes Nance Pear, MD  allopurinol (ZYLOPRIM) 100 MG tablet Take 50 mg by mouth daily.    [provider]  aspirin EC 81 MG tablet Take 81 mg by mouth at bedtime.     [provider]  chlorpheniramine-HYDROcodone (TUSSIONEX) 10-8 MG/5ML SUER Take 5 mLs by mouth every 12 (twelve) hours as needed for cough. 06/07/20   Lloyd Huger, MD  colchicine 0.6 MG tablet Take 0.6 mg by mouth 2 (two) times daily. 10/08/19   [provider]  dexamethasone (DECADRON) 4 MG tablet Take 5 tablets (20 mg total) by mouth daily. Take the day after Velcade on days 2,5,9,12. Take with breakfast 06/13/20   Lloyd Huger, MD  docusate sodium (COLACE) 100 MG capsule Take 1 capsule (100 mg total) by mouth 2 (two) times daily. 04/03/20   [name], MD  Ensure Max Protein (ENSURE MAX PROTEIN) LIQD Take 330 mLs (11 oz total) by mouth 2 (two) times daily between meals. 04/03/20   [name], MD  fexofenadine (ALLEGRA) 180 MG tablet Take 180 mg by mouth daily.    [provider]  FLUoxetine (  PROZAC) 10 MG capsule Take 10 mg by mouth daily. 06/29/19   [provider]  folic acid (FOLVITE) 1 MG tablet folic acid 1 mg tablet    [provider]  HYDROcodone-acetaminophen (NORCO/VICODIN) 5-325 MG tablet Take 1-2 tablets by mouth every 4 (four) hours as needed for moderate pain. Patient not taking: Reported on 06/13/2020 05/30/20   Lloyd Huger, MD  hydrocortisone (ANUSOL-HC) 2.5 % rectal cream Apply 1 application topically 2 (two) times daily. 02/12/20   [provider]  LANTUS SOLOSTAR 100 UNIT/ML Solostar Pen Inject 16 Units into the skin at bedtime. Patient not taking: No sig reported 03/09/20   [provider]  lenalidomide (REVLIMID)  10 MG capsule Take 1 capsule (10 mg total) by mouth daily. Take 14 days on, 7 days off, repeat every 21 days Patient not taking: No sig reported 04/20/20   Lloyd Huger, MD  levothyroxine (SYNTHROID) 50 MCG tablet Take 1 tablet (50 mcg total) by mouth daily before breakfast. On Tuesday, Thursday, Saturday and Sunday 04/03/20   [name], MD  levothyroxine (SYNTHROID) 75 MCG tablet Take 1 tablet (75 mcg total) by mouth daily before breakfast. On Monday, Wednesday and Friday 04/03/20   [name], MD  lidocaine (LIDODERM) 5 % Place 1 patch onto the skin daily. Remove & Discard patch within 12 hours or as directed by MD Patient not taking: No sig reported 04/04/20   [name], MD  magnesium oxide (MAG-OX) 400 MG tablet Take 200 mg by mouth daily.    [provider]  mexiletine (MEXITIL) 200 MG capsule Take 1 capsule (200 mg total) by mouth every 12 (twelve) hours. 03/14/17   Patsey Berthold, NP  midodrine (PROAMATINE) 10 MG tablet Take 1 tablet (10 mg total) by mouth 2 (two) times daily with a meal. 04/03/20   [name], MD  nystatin cream (MYCOSTATIN) Apply 1 application topically 2 (two) times daily. Patient not taking: No sig reported 03/06/20   [provider]  ondansetron (ZOFRAN) 8 MG tablet Take 1 tablet (8 mg total) by mouth 2 (two) times daily as needed (Nausea or vomiting). Patient not taking: Reported on 06/13/2020 03/01/20   Lloyd Huger, MD  OXYGEN Inhale 2 L into the lungs continuous.    [provider]  pantoprazole (PROTONIX) 40 MG tablet Take 40 mg by mouth daily.  05/16/19   [provider]  potassium chloride SA (KLOR-CON) 20 MEQ tablet Take 20 mEq by mouth daily.     [provider]  Prenatal Vit-Fe Fumarate-FA (PRENATAL MULTIVITAMIN) TABS tablet Take 1 tablet by mouth daily.    [provider]  prochlorperazine (COMPAZINE) 10 MG tablet Take 1 tablet (10 mg total) by mouth every 6 (six) hours as needed  (Nausea or vomiting). Patient not taking: Reported on 06/13/2020 03/01/20   Lloyd Huger, MD  simvastatin (ZOCOR) 40 MG tablet Take 20 mg by mouth at bedtime.     [provider]  spironolactone (ALDACTONE) 25 MG tablet Take 25 mg by mouth daily.     [provider]  sucralfate (CARAFATE) 1 g tablet Take 1 tablet by mouth 3 (three) times daily before meals. 09/15/19 09/14/20  [provider]  torsemide (DEMADEX) 20 MG tablet Take 2 tablets (40 mg total) by mouth daily. Future refills need to come from kidney doctor 02/21/20   Alisa Graff, FNP  traMADol (ULTRAM) 50 MG tablet Take 1 tablet (50 mg total) by mouth every 6 (six)  hours as needed for moderate pain. 04/03/20   [name], MD  traZODone (DESYREL) 50 MG tablet Take 50 mg by mouth at bedtime.    [provider]    Current Facility-Administered Medications  Medication Dose Route Frequency Provider Last Rate Last Admin  . 0.9 %  sodium chloride infusion   Intravenous Continuous Agbata, Tochukwu, MD 50 mL/hr at 06/19/20 1100 New Bag at 06/19/20 1100  . allopurinol (ZYLOPRIM) tablet 50 mg  50 mg Oral Daily Agbata, Tochukwu, MD   50 mg at 06/19/20 1102  . aspirin EC tablet 81 mg  81 mg Oral QHS Agbata, Tochukwu, MD      . colchicine tablet 0.6 mg  0.6 mg Oral BID Agbata, Tochukwu, MD   0.6 mg at 06/19/20 1102  . dexamethasone (DECADRON) tablet 20 mg  20 mg Oral Daily Agbata, Tochukwu, MD      . enoxaparin (LOVENOX) injection 40 mg  40 mg Subcutaneous Q24H Agbata, Tochukwu, MD   40 mg at 06/19/20 1106  . folic acid (FOLVITE) tablet 1 mg  1 mg Oral Daily Agbata, Tochukwu, MD   1 mg at 06/19/20 1101  . insulin aspart (novoLOG) injection 0-9 Units  0-9 Units Subcutaneous TID WC Agbata, Tochukwu, MD      . Derrill Memo ON 06/20/2020] levothyroxine (SYNTHROID) tablet 50 mcg  50 mcg Oral Once per day on Sun Tue Thu Sat Collier Bullock, MD      . Derrill Memo ON 06/21/2020] levothyroxine (SYNTHROID) tablet 75 mcg  75 mcg  Oral Q M,W,F Agbata, Tochukwu, MD      . loratadine (CLARITIN) tablet 10 mg  10 mg Oral Daily Agbata, Tochukwu, MD   10 mg at 06/19/20 1102  . magnesium oxide (MAG-OX) tablet 200 mg  200 mg Oral Daily Agbata, Tochukwu, MD   200 mg at 06/19/20 1102  . mexiletine (MEXITIL) capsule 200 mg  200 mg Oral Q12H Agbata, Tochukwu, MD      . pantoprazole (PROTONIX) EC tablet 40 mg  40 mg Oral Daily Agbata, Tochukwu, MD   40 mg at 06/19/20 1101  . prenatal multivitamin tablet 1 tablet  1 tablet Oral Daily Agbata, Tochukwu, MD      . protein supplement (ENSURE MAX) liquid  11 oz Oral BID BM Agbata, Tochukwu, MD      . simvastatin (ZOCOR) tablet 20 mg  20 mg Oral QHS Agbata, Tochukwu, MD      . sodium zirconium cyclosilicate (LOKELMA) packet 10 g  10 g Oral Daily Agbata, Tochukwu, MD      . sucralfate (CARAFATE) tablet 1 g  1 g Oral TID AC Agbata, Tochukwu, MD   1 g at 06/19/20 1101  . torsemide (DEMADEX) tablet 40 mg  40 mg Oral Daily Agbata, Tochukwu, MD      . traZODone (DESYREL) tablet 50 mg  50 mg Oral QHS Agbata, Tochukwu, MD        Allergies as of 06/18/2020 - Review Complete 06/18/2020  Allergen Reaction Noted  . Celebrex [celecoxib] Anaphylaxis 02/04/2017  . Glipizide Anaphylaxis 08/03/2013  . Levaquin [levofloxacin in d5w] Other (See Comments) 02/04/2017  . Levofloxacin Other (See Comments) and Palpitations 02/10/2017  . Lisinopril Swelling 05/19/2019  . Sulfa antibiotics Other (See Comments) and Anaphylaxis 08/03/2013  . Metformin Other (See Comments) 08/03/2013  . Penicillins Rash and Other (See Comments) 08/03/2013     Review of Systems:    All systems reviewed and negative except where noted in HPI.     Physical  Exam:  Vital signs in last 24 hours: Temp:  [97.5 F (36.4 C)-97.8 F (36.6 C)] 97.6 F (36.4 C) (02/07 1017) Pulse Rate:  [68-89] 74 (02/07 1017) Resp:  [13-18] 18 (02/07 1017) BP: (101-116)/(55-73) 106/69 (02/07 1017) SpO2:  [91 %-100 %] 95 % (02/07 1017) Weight:   [70.3 kg] 70.3 kg (02/06 1812)   General:   Pleasant elderly woman in NAD Head:  Normocephalic and atraumatic. Eyes:   No icterus.   Conjunctiva pink. Ears:  Normal auditory acuity. Mouth: Mucosa pink moist, no lesions. Neck:  Supple; no masses felt Lungs: *Respirations even and unlabored. Lungs clear to auscultation bilaterally.   No wheezes, crackles, or rhonchi.  Heart:  S1S2, RRR, no MRG. No edema. Abdomen:   Flat, soft, diffuse periumbilical tenderness- mild, nondistended. Normal bowel sounds. No appreciable masses or hepatomegaly. No rebound signs or other peritoneal signs. Rectal:  Not performed.  Msk:  MAEW x4, No clubbing or cyanosis. Strength 5/5. Symmetrical without gross deformities. Neurologic:  Alert and  oriented x4;  Cranial nerves II-XII intact.  Skin:  Warm, dry, pink without significant lesions or rashes. Psych:  Alert and cooperative. Normal affect.  LAB RESULTS: Recent Labs    06/18/20 1757  WBC 10.9*  HGB 9.9*  HCT 31.0*  PLT 147*   BMET Recent Labs    06/18/20 1757  NA 135  K 5.7*  CL 100  CO2 26  GLUCOSE 203*  BUN 49*  CREATININE 1.45*  CALCIUM 9.5   LFT Recent Labs    06/18/20 1757  PROT 6.6  ALBUMIN 4.1  AST 25  ALT 33  ALKPHOS 75  BILITOT 0.5   PT/INR No results for input(s): LABPROT, INR in the last 72 hours.  STUDIES: CT ABDOMEN PELVIS W CONTRAST  Result Date: 06/18/2020 CLINICAL DATA:  Abdominal pain constipation x5 days. EXAM: CT ABDOMEN AND PELVIS WITH CONTRAST TECHNIQUE: Multidetector CT imaging of the abdomen and pelvis was performed using the standard protocol following bolus administration of intravenous contrast. CONTRAST:  78m OMNIPAQUE IOHEXOL 300 MG/ML  SOLN COMPARISON:  CT abdomen pelvis July 16, 2019 FINDINGS: Lower chest: Cardiomegaly. Trace pericardial fluid. Cardiac pacemaker wires noted in the right atrium and right ventricle. Slight blooming of the left ventricular apex with stable calcifications possibly  reflecting sequela prior infarct. Hepatobiliary: No suspicious hepatic lesion. Prominent Riedel's lobe. Similar slight left lobe and caudate lobe hypertrophy. Cholecystectomy. Mild prominence of biliary tree, similar to prior likely reservoir effect. Pancreas: Unremarkable Spleen: Unremarkable Adrenals/Urinary Tract: Stable size of the low-attenuation bilateral adrenal masses, consistent with benign adenomas. Bilateral parenchymal scarring and moderate atrophy. No hydronephrosis. Bladder is predominantly decompressed limiting evaluation. No filling defects seen in the collecting system or proximal ureters on delayed imaging. Stomach/Bowel: Stomach is within normal limits. No suspicious small bowel wall thickening. There is a moderate burden of formed stool throughout the ascending and transverse colon, with fecalization of some distal loops of small bowel. The descending colon, sigmoid colon and rectum are predominantly decompressed. Colonic diverticulosis. Numerous radiopaque tablets throughout the colon. Vascular/Lymphatic: Extensive aortic and branch vessel atherosclerosis. No enlarged abdominal or pelvic lymph nodes. Reproductive: Uterus and bilateral adnexa are unremarkable. Other: No abdominopelvic ascites Musculoskeletal: Right hip arthroplasty. No acute osseous abnormality. IMPRESSION: 1. Moderate burden of formed stool throughout the ascending and transverse colon with fecalization of some distal loops of small bowel, consistent with constipation with slow transit. No evidence of obstruction. 2. 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 Suggest correlation with liver  function tests in hepatic serology if not recently performed. 3. Unchanged bilateral low-density adrenal lesions consistent with benign adrenal adenomas. 4. Cardiomegaly with trace pericardial fluid. 5. Aortic atherosclerosis. Aortic Atherosclerosis (ICD10-I70.0). Electronically Signed   By: Dahlia Bailiff MD   On: 06/18/2020 23:13        Impression / Plan:   1. Recurrent Constipation-  Likely medication related. Had formed soft bm today while I was in with patient. States her pain is improving and she is feeling better. Not in addition to the constipating effects of her narcotics, that Velcade side effect listed at 24-34% . Recommend daily miralax- 1-2 doses/d and adequate hydration. Recommend adding daily fiber like psyllium. Would like to see her in a couple weeks in clinic. 2. GERD- will renew pantoprazole 28m po qd.  3. Cirrhosis- presently not problematic  Thank you very much for this consult. These services were provided by CStephens November NP-C, in collaboration with CAndrey Farmer with whom I have discussed this patient in full.   CStephens November NP-C

## 2020-06-19 NOTE — Progress Notes (Signed)
Disaster charting admission profile completed

## 2020-06-19 NOTE — Plan of Care (Signed)
Discharge order received. Patient mental status is at baseline. Vital signs stable . No signs of acute distress. Discharge instructions given. Patient verbalized understanding. No other issues noted at this time.

## 2020-06-20 ENCOUNTER — Inpatient Hospital Stay (HOSPITAL_BASED_OUTPATIENT_CLINIC_OR_DEPARTMENT_OTHER): Payer: Medicare Other | Admitting: Oncology

## 2020-06-20 ENCOUNTER — Inpatient Hospital Stay: Payer: Medicare Other

## 2020-06-20 VITALS — BP 102/60 | HR 71 | Temp 96.2°F | Resp 18 | Wt 162.3 lb

## 2020-06-20 DIAGNOSIS — C9000 Multiple myeloma not having achieved remission: Secondary | ICD-10-CM

## 2020-06-20 DIAGNOSIS — Z5112 Encounter for antineoplastic immunotherapy: Secondary | ICD-10-CM | POA: Diagnosis not present

## 2020-06-20 LAB — CBC WITH DIFFERENTIAL/PLATELET
Abs Immature Granulocytes: 0.44 10*3/uL — ABNORMAL HIGH (ref 0.00–0.07)
Basophils Absolute: 0 10*3/uL (ref 0.0–0.1)
Basophils Relative: 0 %
Eosinophils Absolute: 0.1 10*3/uL (ref 0.0–0.5)
Eosinophils Relative: 1 %
HCT: 29.7 % — ABNORMAL LOW (ref 36.0–46.0)
Hemoglobin: 9.6 g/dL — ABNORMAL LOW (ref 12.0–15.0)
Immature Granulocytes: 5 %
Lymphocytes Relative: 19 %
Lymphs Abs: 1.6 10*3/uL (ref 0.7–4.0)
MCH: 31.2 pg (ref 26.0–34.0)
MCHC: 32.3 g/dL (ref 30.0–36.0)
MCV: 96.4 fL (ref 80.0–100.0)
Monocytes Absolute: 0.8 10*3/uL (ref 0.1–1.0)
Monocytes Relative: 9 %
Neutro Abs: 5.4 10*3/uL (ref 1.7–7.7)
Neutrophils Relative %: 66 %
Platelets: 109 10*3/uL — ABNORMAL LOW (ref 150–400)
RBC: 3.08 MIL/uL — ABNORMAL LOW (ref 3.87–5.11)
RDW: 16.6 % — ABNORMAL HIGH (ref 11.5–15.5)
WBC: 8.3 10*3/uL (ref 4.0–10.5)
nRBC: 1.1 % — ABNORMAL HIGH (ref 0.0–0.2)

## 2020-06-20 LAB — COMPREHENSIVE METABOLIC PANEL
ALT: 36 U/L (ref 0–44)
AST: 37 U/L (ref 15–41)
Albumin: 3.6 g/dL (ref 3.5–5.0)
Alkaline Phosphatase: 74 U/L (ref 38–126)
Anion gap: 10 (ref 5–15)
BUN: 35 mg/dL — ABNORMAL HIGH (ref 8–23)
CO2: 30 mmol/L (ref 22–32)
Calcium: 8.6 mg/dL — ABNORMAL LOW (ref 8.9–10.3)
Chloride: 94 mmol/L — ABNORMAL LOW (ref 98–111)
Creatinine, Ser: 1.34 mg/dL — ABNORMAL HIGH (ref 0.44–1.00)
GFR, Estimated: 41 mL/min — ABNORMAL LOW (ref >60)
Glucose, Bld: 200 mg/dL — ABNORMAL HIGH (ref 70–99)
Potassium: 3.9 mmol/L (ref 3.5–5.1)
Sodium: 134 mmol/L — ABNORMAL LOW (ref 135–145)
Total Bilirubin: 0.6 mg/dL (ref 0.3–1.2)
Total Protein: 6 g/dL — ABNORMAL LOW (ref 6.5–8.1)

## 2020-06-20 MED ORDER — DEXAMETHASONE 4 MG PO TABS
20.0000 mg | ORAL_TABLET | Freq: Once | ORAL | Status: AC
  Administered 2020-06-20: 20 mg via ORAL
  Filled 2020-06-20: qty 5

## 2020-06-20 MED ORDER — BORTEZOMIB CHEMO SQ INJECTION 3.5 MG (2.5MG/ML)
1.3000 mg/m2 | Freq: Once | INTRAMUSCULAR | Status: AC
  Administered 2020-06-20: 2.5 mg via SUBCUTANEOUS
  Filled 2020-06-20: qty 1

## 2020-06-20 NOTE — Progress Notes (Signed)
Patient tolerated injection well. Discharged home.

## 2020-06-20 NOTE — Progress Notes (Unsigned)
Pt here for MM. Discharged from Hospital last night. Went for abdominal pain, constipation. Was found to have hyperkalemia. Pt was given enemas and oral meds, miralax in the ED. She finally had a BM. Several BMs at home today. Pt also received IVFs prior to leaving ED. Reports abd better. Just feels alittle bit sore.

## 2020-06-21 LAB — KAPPA/LAMBDA LIGHT CHAINS
Kappa free light chain: 5.9 mg/L (ref 3.3–19.4)
Kappa, lambda light chain ratio: 1.28 (ref 0.26–1.65)
Lambda free light chains: 4.6 mg/L — ABNORMAL LOW (ref 5.7–26.3)

## 2020-06-21 LAB — IGG, IGA, IGM
IgA: 283 mg/dL (ref 64–422)
IgG (Immunoglobin G), Serum: 187 mg/dL — ABNORMAL LOW (ref 586–1602)
IgM (Immunoglobulin M), Srm: <5 mg/dL — ABNORMAL LOW (ref 26–217)

## 2020-06-22 LAB — PROTEIN ELECTROPHORESIS, SERUM
A/G Ratio: 1.5 (ref 0.7–1.7)
Albumin ELP: 3.2 g/dL (ref 2.9–4.4)
Alpha-1-Globulin: 0.2 g/dL (ref 0.0–0.4)
Alpha-2-Globulin: 0.9 g/dL (ref 0.4–1.0)
Beta Globulin: 0.9 g/dL (ref 0.7–1.3)
Gamma Globulin: 0.2 g/dL — ABNORMAL LOW (ref 0.4–1.8)
Globulin, Total: 2.2 g/dL (ref 2.2–3.9)
M-Spike, %: 0.1 g/dL — ABNORMAL HIGH
Total Protein ELP: 5.4 g/dL — ABNORMAL LOW (ref 6.0–8.5)

## 2020-06-23 ENCOUNTER — Ambulatory Visit
Admission: RE | Admit: 2020-06-23 | Discharge: 2020-06-23 | Disposition: A | Discharge status: Home / Self Care | Payer: Medicare Other | Source: Ambulatory Visit | Attending: Oncology | Admitting: Oncology

## 2020-06-23 ENCOUNTER — Encounter: Payer: Self-pay | Admitting: Oncology

## 2020-06-23 ENCOUNTER — Other Ambulatory Visit: Payer: Self-pay

## 2020-06-23 ENCOUNTER — Inpatient Hospital Stay: Payer: Medicare Other

## 2020-06-23 ENCOUNTER — Telehealth: Payer: Self-pay | Admitting: *Deleted

## 2020-06-23 ENCOUNTER — Inpatient Hospital Stay (HOSPITAL_BASED_OUTPATIENT_CLINIC_OR_DEPARTMENT_OTHER): Payer: Medicare Other | Admitting: Oncology

## 2020-06-23 VITALS — BP 102/62 | HR 79 | Temp 97.7°F | Resp 18

## 2020-06-23 DIAGNOSIS — Z5112 Encounter for antineoplastic immunotherapy: Secondary | ICD-10-CM | POA: Diagnosis not present

## 2020-06-23 DIAGNOSIS — R1013 Epigastric pain: Secondary | ICD-10-CM

## 2020-06-23 DIAGNOSIS — K5900 Constipation, unspecified: Secondary | ICD-10-CM

## 2020-06-23 DIAGNOSIS — C9000 Multiple myeloma not having achieved remission: Secondary | ICD-10-CM | POA: Diagnosis not present

## 2020-06-23 MED ORDER — LACTULOSE 20 GM/30ML PO SOLN: 30.0000 mL | Freq: Two times a day (BID) | ORAL | 1 refills | Status: DC

## 2020-06-23 MED ORDER — AZITHROMYCIN 250 MG PO TABS: ORAL_TABLET | ORAL | 0 refills | Status: DC

## 2020-06-23 NOTE — Telephone Encounter (Signed)
Patient has been added on to see Sonia Baller in infusion, she will also be getting 1 liter of IVF when she comes per [place]. Ok per Haywood Pao for patient to get IVF in infusion.

## 2020-06-23 NOTE — Progress Notes (Signed)
Symptom Management Consult note [facility]  [phone] [phone]  [name], MD as PCP - General (Family Medicine) Lloyd Huger, MD as Consulting Physician (Oncology)   Name of the patient: Heather Peterson  [id number]  [birth year]   Date of visit: 06/23/2020   Diagnosis- Multiple myeloma  Chief complaint/ Reason for visit- Constipation   Heme/Onc history: Heather Peterson is a 80 year old female with past medical history significant for CAD, hypertension, CHF, ventricular tachycardia, cardiomyopathy, myocardial infarction, COPD, diabetes type 2, hip fracture, hyperlipidemia, former tobacco user, anemia and multiple myeloma who is being treated by Dr. Grayland Ormond with Velcade and Revlimid.  She started her first round of RVD on 03/13/2020.  She was hospitalized from 03/28/2020-04/03/20 after she sustained a fall requiring a right hip replacement on 03/29/2020.  She was discharged to SNF for rehab.  Cycle 2 of RVD was delayed secondary to hip fracture and rehab.  Cycle 2 was started on 05/23/2020.  Unfortunately developed significant side effects to Revlimid was stopped and subsequently discontinued permanently.  She is status post cycle 3 Velcade + dexamethasone- D1 and D8 only.  Interval history-today, she presents with her son for complaints of epigastric abdominal tenderness and constipation.  She was evaluated in the emergency room this past weekend for constipation.  Work-up included imaging showing moderate burden of formed stool throughout the ascending and transverse colon with equalization and some distal loops of small bowel consistent with constipation with slow transit.  No evidence of obstruction.  She had a small bowel movement prior to discharge around.  She has not had a bowel movement since.  She did take a hydrocodone when she was discharged and sent home on Monday.  She is having trouble  eating secondary to bloating and a full feeling. Denies any fevers.  Denies any diarrhea.  ECOG FS:2 - Symptomatic, <50% confined to bed  Review of systems- Review of Systems  Constitutional: Positive for malaise/fatigue. Negative for chills, fever and weight loss.  HENT: Negative for congestion, ear pain and tinnitus.   Eyes: Negative.  Negative for blurred vision and double vision.  Respiratory: Negative.  Negative for cough, sputum production and shortness of breath.   Cardiovascular: Negative.  Negative for chest pain, palpitations and leg swelling.  Gastrointestinal: Positive for abdominal pain, constipation and nausea. Negative for diarrhea and vomiting.  Genitourinary: Negative for dysuria, frequency and urgency.  Musculoskeletal: Negative for back pain and falls.  Skin: Negative.  Negative for rash.  Neurological: Negative.  Negative for weakness and headaches.  Endo/Heme/Allergies: Negative.  Does not bruise/bleed easily.  Psychiatric/Behavioral: Negative.  Negative for depression. The patient is not nervous/anxious and does not have insomnia.      Current treatment- Velcade + dexamethosone  Allergies  Allergen Reactions  . Celebrex [Celecoxib] Anaphylaxis  . Glipizide Anaphylaxis  . Levaquin [Levofloxacin In D5w] Other (See Comments)    Heart arrhthymias  . Levofloxacin Other (See Comments) and Palpitations    ICD fired  . Lisinopril Swelling    Lip and facial swelling  . Sulfa Antibiotics Other (See Comments) and Anaphylaxis    Reaction: unknown  . Metformin Other (See Comments)    Gi tolerance   . Penicillins Rash and Other (See Comments)    Has patient had a PCN reaction causing immediate rash, facial/tongue/throat swelling, SOB or lightheadedness with hypotension: Unknown Has patient had a PCN reaction causing severe rash involving mucus membranes or skin necrosis: No  Has patient had a PCN reaction that required hospitalization: No Has patient had a PCN reaction  occurring within the last 10 years: No If all of the above answers are "NO", then may proceed with Cephalosporin use.      Past Medical History:  Diagnosis Date  . Anxiety   . Chronic combined systolic and diastolic CHF (congestive heart failure) ([place])   . Chronic kidney disease   . Coronary artery disease   . Depression   . Diabetes mellitus without complication ([place])   . Diabetes mellitus, type II ([place])   . Hypertension   . MI (myocardial infarction) ([place])    x 5  . Pacemaker   . 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[place])   . Prolonged Q-T interval on ECG   . Thyroid disease      Past Surgical History:  Procedure Laterality Date  . CENTRAL LINE INSERTION  03/11/2017   Procedure: CENTRAL LINE INSERTION;  Surgeon: Leonie Man, MD;  Location: [place] [facility];  Service: Cardiovascular;;  . CHOLECYSTECTOMY    . COLONOSCOPY WITH PROPOFOL N/A 09/01/2019   Procedure: COLONOSCOPY WITH PROPOFOL;  Surgeon: Toledo, Benay Pike, MD;  Location: [facility] ENDOSCOPY;  Service: Gastroenterology;  Laterality: N/A;  . CORONARY STENT INTERVENTION W/IMPELLA N/A 03/11/2017   Procedure: Coronary Stent Intervention w/Impella;  Surgeon: Leonie Man, MD;  Location: [place] [facility];  Service: Cardiovascular;  Laterality: N/A;  . ESOPHAGOGASTRODUODENOSCOPY (EGD) WITH PROPOFOL N/A 09/01/2019   Procedure: ESOPHAGOGASTRODUODENOSCOPY (EGD) WITH PROPOFOL;  Surgeon: Toledo, Benay Pike, MD;  Location: [facility] ENDOSCOPY;  Service: Gastroenterology;  Laterality: N/A;  . ESOPHAGOGASTRODUODENOSCOPY (EGD) WITH PROPOFOL N/A 09/08/2019   Procedure: ESOPHAGOGASTRODUODENOSCOPY (EGD) WITH PROPOFOL;  Surgeon: [name], MD;  Location[facility] END[facility];  Service: Gastroenterology;  Laterality: N/A;  . EYE SURGERY    . HIP ARTHROPLASTY Right 03/29/2020   Procedure: ARTHROPLASTY BIPOLAR HIP (HEMIARTHROPLASTY);  Surgeon: Corky Mull, MD;  Location: [facility] ORS;  Service: Orthopedics;  Laterality: Right;  . INTRAVASCULAR PRESSURE  WIRE/FFR STUDY N/A 03/11/2017   Procedure: INTRAVASCULAR PRESSURE WIRE/FFR STUDY;  Surgeon: Leonie Man, MD;  Location: [place] [facility];  Service: Cardiovascular;  Laterality: N/A;  . INTRAVASCULAR ULTRASOUND/IVUS N/A 03/11/2017   Procedure: Intravascular Ultrasound/IVUS;  Surgeon: Leonie Man, MD;  Location: [place] [facility];  Service: Cardiovascular;  Laterality: N/A;  . LEFT HEART CATH AND CORONARY ANGIOGRAPHY N/A 03/05/2017   Procedure: LEFT HEART CATH AND CORONARY ANGIOGRAPHY;  Surgeon: Isaias Cowman, MD;  Location: [place] [facility];  Service: Cardiovascular;  Laterality: N/A;  . PACEMAKER IMPLANT    . pacemaker/defibrillator Left     Social History   Socioeconomic History  . Marital status: Married    Spouse name: rodney  . Number of children: 2  . Years of education: Not on file  . Highest education level: High school graduate  Occupational History  . Not on file  Tobacco Use  . Smoking status: Current Every Day Smoker    Packs/day: 0.25    Types: E-cigarettes, Cigarettes  . Smokeless tobacco: Never Used  Vaping Use  . Vaping Use: Former  Substance and Sexual Activity  . Alcohol use: Not Currently    Comment: occasionally  . Drug use: Yes    Comment: prescribed pain meds  . Sexual activity: Not Currently  Other Topics Concern  . Not on file  Social History Narrative  . Not on file   Social Determinants of Health   Financial Resource Strain:  Not on file  Food Insecurity: Not on file  Transportation Needs: Not on file  Physical Activity: Not on file  Stress: Not on file  Social Connections: Not on file  Intimate Partner Violence: Not on file    Family History  Problem Relation Age of Onset  . Hypertension Father   . Heart attack Father   . Depression Sister   . Depression Brother   . Depression Brother      Current Outpatient Medications:  .  allopurinol (ZYLOPRIM) 100 MG tablet, Take 50 mg by mouth daily., Disp: , Rfl:  .   aspirin EC 81 MG tablet, Take 81 mg by mouth at bedtime. , Disp: , Rfl:  .  chlorpheniramine-HYDROcodone (TUSSIONEX) 10-8 MG/5ML SUER, Take 5 mLs by mouth every 12 (twelve) hours as needed for cough., Disp: 140 mL, Rfl: 0 .  colchicine 0.6 MG tablet, Take 0.6 mg by mouth 2 (two) times daily., Disp: , Rfl:  .  dexamethasone (DECADRON) 4 MG tablet, Take 5 tablets (20 mg total) by mouth daily. Take the day after Velcade on days 2,5,9,12. Take with breakfast, Disp: 40 tablet, Rfl: 3 .  Ensure Max Protein (ENSURE MAX PROTEIN) LIQD, Take 330 mLs (11 oz total) by mouth 2 (two) times daily between meals., Disp: , Rfl:  .  fexofenadine (ALLEGRA) 180 MG tablet, Take 180 mg by mouth daily., Disp: , Rfl:  .  FLUoxetine (PROZAC) 10 MG capsule, Take 10 mg by mouth daily., Disp: , Rfl:  .  folic acid (FOLVITE) 1 MG tablet, folic acid 1 mg tablet, Disp: , Rfl:  .  hydrocortisone (ANUSOL-HC) 2.5 % rectal cream, Apply 1 application topically 2 (two) times daily., Disp: , Rfl:  .  levothyroxine (SYNTHROID) 75 MCG tablet, Take 1 tablet (75 mcg total) by mouth daily before breakfast. On Monday, Wednesday and Friday, Disp: , Rfl:  .  magnesium oxide (MAG-OX) 400 MG tablet, Take 200 mg by mouth daily., Disp: , Rfl:  .  mexiletine (MEXITIL) 200 MG capsule, Take 1 capsule (200 mg total) by mouth every 12 (twelve) hours., Disp: 60 capsule, Rfl: 1 .  midodrine (PROAMATINE) 10 MG tablet, Take 1 tablet (10 mg total) by mouth 2 (two) times daily with a meal., Disp: , Rfl:  .  OXYGEN, Inhale 2 L into the lungs continuous., Disp: , Rfl:  .  pantoprazole (PROTONIX) 40 MG tablet, Take 40 mg by mouth daily. , Disp: , Rfl:  .  polyethylene glycol (MIRALAX) 17 g packet, Take 17 g by mouth daily., Disp: 14 each, Rfl: 0 .  Prenatal Vit-Fe Fumarate-FA (PRENATAL MULTIVITAMIN) TABS tablet, Take 1 tablet by mouth daily., Disp: , Rfl:  .  simvastatin (ZOCOR) 40 MG tablet, Take 20 mg by mouth at bedtime. , Disp: , Rfl: 2 .  spironolactone  (ALDACTONE) 25 MG tablet, Take 25 mg by mouth daily. , Disp: , Rfl:  .  sucralfate (CARAFATE) 1 g tablet, Take 1 tablet by mouth 3 (three) times daily before meals., Disp: , Rfl:  .  torsemide (DEMADEX) 20 MG tablet, Take 2 tablets (40 mg total) by mouth daily. Future refills need to come from kidney doctor, Disp: 60 tablet, Rfl: 1 .  traMADol (ULTRAM) 50 MG tablet, Take 1 tablet (50 mg total) by mouth every 6 (six) hours as needed for moderate pain., Disp: 30 tablet, Rfl: 0 .  traZODone (DESYREL) 50 MG tablet, Take 50 mg by mouth at bedtime., Disp: , Rfl:   Physical exam:  Vitals:   06/23/20 1422  BP: 102/62  Pulse: 79  Resp: 18  Temp: 97.7 F (36.5 C)  TempSrc: Tympanic   Physical Exam Constitutional:      General: Vital signs are normal.     Appearance: Normal appearance.  HENT:     Head: Normocephalic and atraumatic.  Eyes:     Pupils: Pupils are equal, round, and reactive to light.  Cardiovascular:     Rate and Rhythm: Normal rate and regular rhythm.     Heart sounds: Normal heart sounds. No murmur heard.   Pulmonary:     Effort: Pulmonary effort is normal.     Breath sounds: Normal breath sounds. No wheezing.  Abdominal:     General: Bowel sounds are normal. There is distension.     Palpations: Abdomen is soft.     Tenderness: There is abdominal tenderness.  Musculoskeletal:        General: No edema. Normal range of motion.     Cervical back: Normal range of motion.  Skin:    General: Skin is warm and dry.     Findings: No rash.  Neurological:     Mental Status: She is alert and oriented to person, place, and time.  Psychiatric:        Judgment: Judgment normal.      CMP Latest Ref Rng & Units 06/20/2020  Glucose 70 - 99 mg/dL 200(H)  BUN 8 - 23 mg/dL 35(H)  Creatinine 0.44 - 1.00 mg/dL 1.34(H)  Sodium 135 - 145 mmol/L 134(L)  Potassium 3.5 - 5.1 mmol/L 3.9  Chloride 98 - 111 mmol/L 94(L)  CO2 22 - 32 mmol/L 30  Calcium 8.9 - 10.3 mg/dL 8.6(L)  Total  Protein 6.5 - 8.1 g/dL 6.0(L)  Total Bilirubin 0.3 - 1.2 mg/dL 0.6  Alkaline Phos 38 - 126 U/L 74  AST 15 - 41 U/L 37  ALT 0 - 44 U/L 36   CBC Latest Ref Rng & Units 06/20/2020  WBC 4.0 - 10.5 K/uL 8.3  Hemoglobin 12.0 - 15.0 g/dL 9.6(L)  Hematocrit 36.0 - 46.0 % 29.7(L)  Platelets 150 - 400 K/uL 109(L)    No images are attached to the encounter.  CT ABDOMEN PELVIS W CONTRAST  Result Date: 06/18/2020 CLINICAL DATA:  Abdominal pain constipation x5 days. EXAM: CT ABDOMEN AND PELVIS WITH CONTRAST TECHNIQUE: Multidetector CT imaging of the abdomen and pelvis was performed using the standard protocol following bolus administration of intravenous contrast. CONTRAST:  42m OMNIPAQUE IOHEXOL 300 MG/ML  SOLN COMPARISON:  CT abdomen pelvis July 16, 2019 FINDINGS: Lower chest: Cardiomegaly. Trace pericardial fluid. Cardiac pacemaker wires noted in the right atrium and right ventricle. Slight blooming of the left ventricular apex with stable calcifications possibly reflecting sequela prior infarct. Hepatobiliary: No suspicious hepatic lesion. Prominent Riedel's lobe. Similar slight left lobe and caudate lobe hypertrophy. Cholecystectomy. Mild prominence of biliary tree, similar to prior likely reservoir effect. Pancreas: Unremarkable Spleen: Unremarkable Adrenals/Urinary Tract: Stable size of the low-attenuation bilateral adrenal masses, consistent with benign adenomas. Bilateral parenchymal scarring and moderate atrophy. No hydronephrosis. Bladder is predominantly decompressed limiting evaluation. No filling defects seen in the collecting system or proximal ureters on delayed imaging. Stomach/Bowel: Stomach is within normal limits. No suspicious small bowel wall thickening. There is a moderate burden of formed stool throughout the ascending and transverse colon, with fecalization of some distal loops of small bowel. The descending colon, sigmoid colon and rectum are predominantly decompressed. Colonic  diverticulosis. Numerous radiopaque tablets throughout  the colon. Vascular/Lymphatic: Extensive aortic and branch vessel atherosclerosis. No enlarged abdominal or pelvic lymph nodes. Reproductive: Uterus and bilateral adnexa are unremarkable. Other: No abdominopelvic ascites Musculoskeletal: Right hip arthroplasty. No acute osseous abnormality. IMPRESSION: 1. Moderate burden of formed stool throughout the ascending and transverse colon with fecalization of some distal loops of small bowel, consistent with constipation with slow transit. No evidence of obstruction. 2. 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 Suggest correlation with liver function tests in hepatic serology if not recently performed. 3. Unchanged bilateral low-density adrenal lesions consistent with benign adrenal adenomas. 4. Cardiomegaly with trace pericardial fluid. 5. Aortic atherosclerosis. Aortic Atherosclerosis (ICD10-I70.0). Electronically Signed   By: Dahlia Bailiff MD   On: 06/18/2020 23:13   Assessment and plan- Patient is a 80 y.o. female who presents to symptom management for concerns of abdominal bloating, tenderness and constipation x3 days.  Multiple myeloma: -Status post 3 cycles of RVD.   -Unfortunately unable to tolerate Revlimid.   -This has been discontinued and treatment was changed to Velcade plus dexamethasone only. -She will receive Velcade on days 1 and day 8 only.  -Repeat labwork show significant improvement of myeloma labs. -She will return to clinic on 07/07/2020 for cycle 4 Velcade plus dexamethasone.  Constipation: -Likely secondary to narcotics and Velcade (24-34%) -She was hospitalized from 06/18/2020-06/19/2020. -CT abdomen showed large amount of stool burden.  She had small BM prior to discharge. -She was given 2 enemas and several laxatives.  -Reports last BM was on 06/19/2020. -Reports taking 1 Norco on Monday evening when she got home from the hospital. -She has tried MiraLAX, Colace and  magnesium citrate without any relief. -Recommend stat abdominal x-ray two-view to rule out obstruction. -No evidence of infection. -Recommend continuation of combination of stool softeners and laxatives to produce a good bowel movement.  We can even add in lactulose to see if this is helpful. -Recommend stopping all narcotics at this time. -Recommend hydration, increasing her fiber and increased movement.   Disposition: -We will call patient with results of abdominal imaging. -RTC as scheduled on 07/07/2020. -If symptoms are persistent would recommend evaluation by GI.  Visit Diagnosis 1. Epigastric pain   2. Constipation, unspecified constipation type   3. Multiple myeloma, remission status unspecified ([place])     Patient expressed understanding and was in agreement with this plan. She also understands that She can call clinic at any time with any questions, concerns, or complaints.   Greater than 50% was spent in counseling and coordination of care with this patient including but not limited to discussion of the relevant topics above (See A&P) including, but not limited to diagnosis and management of acute and chronic medical conditions.   Thank you for allowing me to participate in the care of this very pleasant patient.    Jacquelin Hawking, NP [place] [facility] Cell - [phone] Pager- [phone] 06/23/2020 4:11 PM

## 2020-06-23 NOTE — Telephone Encounter (Signed)
I called and spoke with [name] and let her know that Heather Peterson will see her in infusion today

## 2020-06-23 NOTE — Telephone Encounter (Signed)
I have not spoken with her, Sonia Baller is going to walk back to see her in infusion.

## 2020-06-23 NOTE — Telephone Encounter (Signed)
[name] called reporting that patient was recently seen in ER for constipation and was given multiple enemas with little return.She states  that she is taking Miralax, Colace and even drank a bottle of Mag Citrate and still has not had a BM since Monday. She reports that between the Velcade, and the fact that the patient has been consuming a large amt of dairy products her problem has not resolved. She has stopped her dairy intake as of yesterday. She has an appointment for Velcade today and daughter is asking if patient can be seen by [facility] today when she comes in. Please call patient with response

## 2020-06-23 NOTE — Addendum Note (Signed)
Addended by: Faythe Casa E on: 06/23/2020 05:59 PM   Modules accepted: Orders

## 2020-06-23 NOTE — Addendum Note (Signed)
Addended by: Faythe Casa E on: 06/23/2020 08:25 PM   Modules accepted: Orders

## 2020-06-23 NOTE — Progress Notes (Signed)
Per Sonia Baller NP,  patient will not be receiving Velcade or IV fluids today.

## 2020-06-24 NOTE — Progress Notes (Signed)
Re: Imaging Results  Called and discussed imaging results with patient and patient's daughter [name].  Recommend starting azithromycin 250 mg daily x5 days to cover for enteritis.  Other than abdominal tenderness she does not have any classic symptoms of gastroenteritis.  We discussed symptoms of a small bowel obstruction and she would need to go directly to the emergency room for further evaluation.  The symptoms include nausea and vomiting, increasing abdominal pain and persistent constipation.    We also spoke at length about her constipation and to continue with MiraLAX and Colace throughout the weekend.  I also recommended trying lactulose once to twice daily to see if this is helpful to have a bowel movement.  Recommend hydration and high-fiber diet.  Patient states she was able to tolerate some soup this evening and she is now laying down.  Spoke with Dr. Grayland Ormond who agrees with this plan.  Faythe Casa, NP 06/24/2020 7:38 AM

## 2020-06-25 ENCOUNTER — Emergency Department
Admission: EM | Admit: 2020-06-25 | Discharge: 2020-06-25 | Disposition: A | Discharge status: Other Acute Inpatient Hospital | Payer: Medicare Other | Attending: Emergency Medicine | Admitting: Emergency Medicine

## 2020-06-25 ENCOUNTER — Other Ambulatory Visit: Payer: Self-pay

## 2020-06-25 ENCOUNTER — Emergency Department: Payer: Medicare Other

## 2020-06-25 DIAGNOSIS — E1122 Type 2 diabetes mellitus with diabetic chronic kidney disease: Secondary | ICD-10-CM | POA: Diagnosis not present

## 2020-06-25 DIAGNOSIS — E039 Hypothyroidism, unspecified: Secondary | ICD-10-CM | POA: Diagnosis not present

## 2020-06-25 DIAGNOSIS — Z79899 Other long term (current) drug therapy: Secondary | ICD-10-CM | POA: Diagnosis not present

## 2020-06-25 DIAGNOSIS — I130 Hypertensive heart and chronic kidney disease with heart failure and stage 1 through stage 4 chronic kidney disease, or unspecified chronic kidney disease: Secondary | ICD-10-CM | POA: Insufficient documentation

## 2020-06-25 DIAGNOSIS — Z96641 Presence of right artificial hip joint: Secondary | ICD-10-CM | POA: Diagnosis not present

## 2020-06-25 DIAGNOSIS — F17210 Nicotine dependence, cigarettes, uncomplicated: Secondary | ICD-10-CM | POA: Diagnosis not present

## 2020-06-25 DIAGNOSIS — I5043 Acute on chronic combined systolic (congestive) and diastolic (congestive) heart failure: Secondary | ICD-10-CM | POA: Insufficient documentation

## 2020-06-25 DIAGNOSIS — I609 Nontraumatic subarachnoid hemorrhage, unspecified: Secondary | ICD-10-CM | POA: Diagnosis not present

## 2020-06-25 DIAGNOSIS — I2510 Atherosclerotic heart disease of native coronary artery without angina pectoris: Secondary | ICD-10-CM | POA: Diagnosis not present

## 2020-06-25 DIAGNOSIS — N183 Chronic kidney disease, stage 3 unspecified: Secondary | ICD-10-CM | POA: Diagnosis not present

## 2020-06-25 DIAGNOSIS — Z20822 Contact with and (suspected) exposure to covid-19: Secondary | ICD-10-CM | POA: Insufficient documentation

## 2020-06-25 DIAGNOSIS — J449 Chronic obstructive pulmonary disease, unspecified: Secondary | ICD-10-CM | POA: Diagnosis not present

## 2020-06-25 DIAGNOSIS — R55 Syncope and collapse: Secondary | ICD-10-CM | POA: Diagnosis present

## 2020-06-25 DIAGNOSIS — Z7982 Long term (current) use of aspirin: Secondary | ICD-10-CM | POA: Diagnosis not present

## 2020-06-25 DIAGNOSIS — Z951 Presence of aortocoronary bypass graft: Secondary | ICD-10-CM | POA: Diagnosis not present

## 2020-06-25 DIAGNOSIS — G44319 Acute post-traumatic headache, not intractable: Secondary | ICD-10-CM

## 2020-06-25 DIAGNOSIS — Z950 Presence of cardiac pacemaker: Secondary | ICD-10-CM | POA: Insufficient documentation

## 2020-06-25 LAB — BASIC METABOLIC PANEL
Anion gap: 12 (ref 5–15)
BUN: 35 mg/dL — ABNORMAL HIGH (ref 8–23)
CO2: 28 mmol/L (ref 22–32)
Calcium: 8.6 mg/dL — ABNORMAL LOW (ref 8.9–10.3)
Chloride: 97 mmol/L — ABNORMAL LOW (ref 98–111)
Creatinine, Ser: 1.71 mg/dL — ABNORMAL HIGH (ref 0.44–1.00)
GFR, Estimated: 30 mL/min — ABNORMAL LOW (ref >60)
Glucose, Bld: 111 mg/dL — ABNORMAL HIGH (ref 70–99)
Potassium: 4.3 mmol/L (ref 3.5–5.1)
Sodium: 137 mmol/L (ref 135–145)

## 2020-06-25 LAB — URINALYSIS, COMPLETE (UACMP) WITH MICROSCOPIC
Bilirubin Urine: NEGATIVE
Glucose, UA: NEGATIVE mg/dL
Hgb urine dipstick: NEGATIVE
Ketones, ur: NEGATIVE mg/dL
Nitrite: NEGATIVE
Protein, ur: NEGATIVE mg/dL
Specific Gravity, Urine: 1.014 (ref 1.005–1.030)
pH: 9 — ABNORMAL HIGH (ref 5.0–8.0)

## 2020-06-25 LAB — CBC
HCT: 28.8 % — ABNORMAL LOW (ref 36.0–46.0)
Hemoglobin: 9.1 g/dL — ABNORMAL LOW (ref 12.0–15.0)
MCH: 31.2 pg (ref 26.0–34.0)
MCHC: 31.6 g/dL (ref 30.0–36.0)
MCV: 98.6 fL (ref 80.0–100.0)
Platelets: 113 10*3/uL — ABNORMAL LOW (ref 150–400)
RBC: 2.92 MIL/uL — ABNORMAL LOW (ref 3.87–5.11)
RDW: 17.4 % — ABNORMAL HIGH (ref 11.5–15.5)
WBC: 14.9 10*3/uL — ABNORMAL HIGH (ref 4.0–10.5)
nRBC: 0.7 % — ABNORMAL HIGH (ref 0.0–0.2)

## 2020-06-25 LAB — SARS CORONAVIRUS 2 (TAT 6-24 HRS): SARS Coronavirus 2: NEGATIVE

## 2020-06-25 LAB — MAGNESIUM: Magnesium: 3.1 mg/dL — ABNORMAL HIGH (ref 1.7–2.4)

## 2020-06-25 LAB — TROPONIN I (HIGH SENSITIVITY)
Troponin I (High Sensitivity): 34 ng/L — ABNORMAL HIGH (ref <18)
Troponin I (High Sensitivity): 34 ng/L — ABNORMAL HIGH (ref <18)

## 2020-06-25 MED ORDER — LACTATED RINGERS IV BOLUS
500.0000 mL | Freq: Once | INTRAVENOUS | Status: AC
  Administered 2020-06-25: 500 mL via INTRAVENOUS

## 2020-06-25 MED ORDER — IOHEXOL 350 MG/ML SOLN
60.0000 mL | Freq: Once | INTRAVENOUS | Status: AC | PRN
  Administered 2020-06-25: 60 mL via INTRAVENOUS

## 2020-06-25 MED ORDER — BUTALBITAL-APAP-CAFFEINE 50-325-40 MG PO TABS
2.0000 | ORAL_TABLET | Freq: Once | ORAL | Status: AC
  Administered 2020-06-25: 2 via ORAL
  Filled 2020-06-25: qty 2

## 2020-06-25 NOTE — Consult Note (Signed)
Neurosurgery-New Consultation Evaluation 06/25/2020 Heather Peterson [id number]  Identifying Statement: Heather Peterson is a 80 y.o. female from [place] [id number] with intracranial hemorrhage  Physician Requesting Consultation: [facility] ED  History of Present Illness: Heather Peterson is here after being found down. She does not remembering falling but notes she has been dizzy for past couple of days. She does state that she has a moderate headache but this may be improving. She was noted to be at neurological baseline but CT head showed a subarachnoid hemorrhage. She is on a baby aspirin. She denies any speech changes, weakness, numbness.   She does have multiple co-morbidities and was recently in hospital for GI obstruction     Past Medical History:  Past Medical History:  Diagnosis Date  . Anxiety   . Chronic combined systolic and diastolic CHF (congestive heart failure) ([place])   . Chronic kidney disease   . Coronary artery disease   . Depression   . Diabetes mellitus without complication ([place])   . Diabetes mellitus, type II ([place])   . Hypertension   . MI (myocardial infarction) ([place])    x 5  . Pacemaker   . 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[place])   . Prolonged Q-T interval on ECG   . Thyroid disease     Social History: Social History   Socioeconomic History  . Marital status: Married    Spouse name: rodney  . Number of children: 2  . Years of education: Not on file  . Highest education level: High school graduate  Occupational History  . Not on file  Tobacco Use  . Smoking status: Current Every Day Smoker    Packs/day: 0.25    Types: E-cigarettes, Cigarettes  . Smokeless tobacco: Never Used  Vaping Use  . Vaping Use: Former  Substance and Sexual Activity  . Alcohol use: Not Currently    Comment: occasionally  . Drug use: Yes    Comment: prescribed pain meds  . Sexual activity: Not Currently  Other Topics Concern  . Not on file  Social History  Narrative  . Not on file   Social Determinants of Health   Financial Resource Strain: Not on file  Food Insecurity: Not on file  Transportation Needs: Not on file  Physical Activity: Not on file  Stress: Not on file  Social Connections: Not on file  Intimate Partner Violence: Not on file   Family History: Family History  Problem Relation Age of Onset  . Hypertension Father   . Heart attack Father   . Depression Sister   . Depression Brother   . Depression Brother     Review of Systems:  Review of Systems - General ROS: Negative Psychological ROS: Negative Ophthalmic ROS: Negative ENT ROS: Negative Hematological and Lymphatic ROS: Negative  Endocrine ROS: Negative Respiratory ROS: Negative Cardiovascular ROS: Negative Gastrointestinal ROS: Negative Genito-Urinary ROS: Negative Musculoskeletal ROS: Negative Neurological ROS: Positive for headache Dermatological ROS: Negative  Physical Exam: BP (!) 104/57   Pulse 76   Temp (!) 97.5 F (36.4 C)   Resp 20   Ht 5\' 4"  (1.626 m)   Wt 75.8 kg   SpO2 94%   BMI 28.68 kg/m  Body mass index is 28.68 kg/m. Body surface area is 1.85 meters squared. General appearance: Alert, cooperative, in no acute distress Head: Normocephalic, atraumatic Eyes: Normal, EOM intact Oropharynx: Wearing facemask Ext: No edema in LE bilaterally  Neurologic exam:  Mental status: alertness: alert, orientation: person, place, time,  affect: normal Speech: fluent and clear, naming and repetition intact Cranial nerves:  II: Visual fields are full by confrontation, no ptosis III/IV/VI: extra-ocular motions intact bilaterally V/VII:no evidence of facial droop or weakness VIII: hearing normal XI: trapezius strength symmetric,  sternocleidomastoid strength symmetric XII: tongue strength symmetric  Motor:strength symmetric 5/5, normal muscle mass and tone in all extremities and no pronator drift Sensory: intact to light touch in all  extremities Gait: not tested  Laboratory: Results for orders placed or performed during the hospital encounter of 06/25/20  SARS CORONAVIRUS 2 (TAT 6-24 HRS) Nasopharyngeal Nasopharyngeal Swab   Specimen: Nasopharyngeal Swab  Result Value Ref Range   SARS Coronavirus 2 NEGATIVE NEGATIVE  Basic metabolic panel  Result Value Ref Range   Sodium 137 135 - 145 mmol/L   Potassium 4.3 3.5 - 5.1 mmol/L   Chloride 97 (L) 98 - 111 mmol/L   CO2 28 22 - 32 mmol/L   Glucose, Bld 111 (H) 70 - 99 mg/dL   BUN 35 (H) 8 - 23 mg/dL   Creatinine, Ser 1.71 (H) 0.44 - 1.00 mg/dL   Calcium 8.6 (L) 8.9 - 10.3 mg/dL   GFR, Estimated 30 (L) >60 mL/min   Anion gap 12 5 - 15  CBC  Result Value Ref Range   WBC 14.9 (H) 4.0 - 10.5 K/uL   RBC 2.92 (L) 3.87 - 5.11 MIL/uL   Hemoglobin 9.1 (L) 12.0 - 15.0 g/dL   HCT 28.8 (L) 36.0 - 46.0 %   MCV 98.6 80.0 - 100.0 fL   MCH 31.2 26.0 - 34.0 pg   MCHC 31.6 30.0 - 36.0 g/dL   RDW 17.4 (H) 11.5 - 15.5 %   Platelets 113 (L) 150 - 400 K/uL   nRBC 0.7 (H) 0.0 - 0.2 %  Urinalysis, Complete w Microscopic Urine, Clean Catch  Result Value Ref Range   Color, Urine YELLOW (A) YELLOW   APPearance CLEAR (A) CLEAR   Specific Gravity, Urine 1.014 1.005 - 1.030   pH 9.0 (H) 5.0 - 8.0   Glucose, UA NEGATIVE NEGATIVE mg/dL   Hgb urine dipstick NEGATIVE NEGATIVE   Bilirubin Urine NEGATIVE NEGATIVE   Ketones, ur NEGATIVE NEGATIVE mg/dL   Protein, ur NEGATIVE NEGATIVE mg/dL   Nitrite NEGATIVE NEGATIVE   Leukocytes,Ua SMALL (A) NEGATIVE   RBC / HPF 0-5 0 - 5 RBC/hpf   WBC, UA 0-5 0 - 5 WBC/hpf   Bacteria, UA RARE (A) NONE SEEN   Squamous Epithelial / LPF 0-5 0 - 5  Magnesium  Result Value Ref Range   Magnesium 3.1 (H) 1.7 - 2.4 mg/dL  Troponin I (High Sensitivity)  Result Value Ref Range   Troponin I (High Sensitivity) 34 (H) <18 ng/L  Troponin I (High Sensitivity)  Result Value Ref Range   Troponin I (High Sensitivity) 34 (H) <18 ng/L   I personally reviewed  labs  Imaging: CT Head: Acute small volume predominantly perimesencephalic subarachnoid hemorrhage which is classically related to an underlying venous bleed. Alternatively, this could be post-traumatic. Underlying aneurysm is thought to be less likely, however CTA of the head is recommended to exclude aneurysm.  CTA Head: 1. Intracranial CTA is negative for aneurysm. 2. Calcified plaque involving both ICA siphons and the dominant left vertebral artery. Up to moderate left ICA cavernous segment Stenosis. 3. Grossly stable subarachnoid hemorrhage in the ambient and quadrigeminal plate cisterns    Impression/Plan:  Heather Hodzic is here for evaluation after a presumed syncopal event. The hemorrhage  is likely venous in nature but not in location typical of trauma. Therefore, I do believe she needs to be monitored and may need further angiogram in the coming days. Given no vascular specialists here, she will need to be transferred to higher level facility    1.  Diagnosis: Subarachnoid hemorrhage  2.  Plan - Recommend transfer to facility with vascular neurosurgery - Keep SBP < 140  - Continue neuro checks

## 2020-06-25 NOTE — ED Notes (Signed)
Report given to [facility]

## 2020-06-25 NOTE — ED Notes (Signed)
CT at bedside

## 2020-06-25 NOTE — ED Provider Notes (Addendum)
[facility] Emergency Department Provider Note ____________________________________________   Event Date/Time   First MD Initiated Contact with Patient 06/25/20 0901     (approximate)  I have reviewed the triage vital signs and the nursing notes.  HISTORY  Chief Complaint Loss of Consciousness   HPI Heather Peterson is a 80 y.o. femalewho presents to the ED for evaluation of syncope.   Chart review indicates history of multiple myeloma and Velcade chemo last received 5 days ago, DM, CKD, CAD s/p multiple stenting, systolic CHF with pacer/ICD in place [facility]. Recent medical admission from 2/6-2/7 for constipation related to chronic opiate use.  Patient presents to the ED today via EMS for evaluation of a syncopal episode.  Patient reports moving from her bed to her recliner earlier this morning without difficulty, and that is last thing she remembers.  EMS reports that the daughter of the patient found her on the floor of the kitchen this morning after a fall or possible syncopal episode of uncertain etiology.  Patient reports that she does not recall falling or having a syncopal episode.  She does not recall getting up from her recliner.  She reports that she feels fine now, reporting a 2/10 aching global headache but otherwise asymptomatic.  Denies any focal weakness, chest pain, shortness of breath, palpitations, trauma or recurrent syncopal episodes.  Since her recent admission, she reports generalized weakness, poor p.o. intake, nausea without vomiting.  She reports continued passage of stool and gas without emesis.   Past Medical History:  Diagnosis Date  . Anxiety   . Chronic combined systolic and diastolic CHF (congestive heart failure) ([place])   . Chronic kidney disease   . Coronary artery disease   . Depression   . Diabetes mellitus without complication ([place])   . Diabetes mellitus, type II ([place])   . Hypertension   . MI (myocardial  infarction) ([place])    x 5  . Pacemaker   . 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[place])   . Prolonged Q-T interval on ECG   . Thyroid disease     Patient Active Problem List   Diagnosis Date Noted  . Constipation due to opioid therapy 06/19/2020  . Hyperkalemia 06/19/2020  . Chronic respiratory failure ([place]) 06/19/2020  . Hypokalemia   . Fall   . Troponin I above reference range   . Status post hip hemiarthroplasty   . Hip fracture ([place]) 03/28/2020  . Multiple myeloma ([place]) 03/01/2020  . Goals of care, counseling/discussion 03/01/2020  . Acute blood loss anemia   . Gastrointestinal hemorrhage   . UGI bleed 09/04/2019  . Dehydration   . Anemia in chronic kidney disease (CKD) 07/04/2019  . CKD (chronic kidney disease), stage III ([place]) 07/04/2019  . Chronic respiratory failure with hypoxia ([place]) 07/03/2019  . Hyponatremia 07/03/2019  . Prolonged Q-T interval on ECG   . Weakness   . Anemia   . MDD (major depressive disorder), recurrent episode, mild ([place]) 12/14/2018  . Tobacco use disorder 12/14/2018  . At risk for prolonged QT interval syndrome 12/14/2018  . 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 Pedal edema 02/19/2018  . SOB (shortness of breath) on exertion 02/19/2018  . Left main coronary artery disease 03/07/2017  . 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[employer]) 03/06/2017  . Ventricular tachycardia ([place]) 03/05/2017  . Defibrillator discharge 03/04/2017  . CHF (congestive heart failure), NYHA class III, chronic, combined ([place]) 03/04/2017  . COPD (chronic obstructive pulmonary disease) ([place]) 02/10/2017  . Diabetes  mellitus type 2, uncomplicated ([place]) 92/42/6834  . H/O ventricular fibrillation 02/10/2017  . Hyperlipidemia 02/10/2017  . 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 Ischemic cardiomyopathy 02/10/2017  . Myocardial infarction ([place]) 02/10/2017  . Moderate mitral regurgitation 02/10/2017  . Syncope 02/04/2017  . CAD (coronary artery  disease) 02/04/2017  . HTN (hypertension) 02/04/2017  . Type 2 diabetes mellitus with stage 3 chronic kidney disease ([place]) 02/04/2017    Past Surgical History:  Procedure Laterality Date  . CENTRAL LINE INSERTION  03/11/2017   Procedure: CENTRAL LINE INSERTION;  Surgeon: Leonie Man, MD;  Location: [place] [facility];  Service: Cardiovascular;;  . CHOLECYSTECTOMY    . COLONOSCOPY WITH PROPOFOL N/A 09/01/2019   Procedure: COLONOSCOPY WITH PROPOFOL;  Surgeon: Toledo, Benay Pike, MD;  Location: [facility] ENDOSCOPY;  Service: Gastroenterology;  Laterality: N/A;  . CORONARY STENT INTERVENTION W/IMPELLA N/A 03/11/2017   Procedure: Coronary Stent Intervention w/Impella;  Surgeon: Leonie Man, MD;  Location: [place] [facility];  Service: Cardiovascular;  Laterality: N/A;  . ESOPHAGOGASTRODUODENOSCOPY (EGD) WITH PROPOFOL N/A 09/01/2019   Procedure: ESOPHAGOGASTRODUODENOSCOPY (EGD) WITH PROPOFOL;  Surgeon: Toledo, Benay Pike, MD;  Location: [facility] ENDOSCOPY;  Service: Gastroenterology;  Laterality: N/A;  . ESOPHAGOGASTRODUODENOSCOPY (EGD) WITH PROPOFOL N/A 09/08/2019   Procedure: ESOPHAGOGASTRODUODENOSCOPY (EGD) WITH PROPOFOL;  Surgeon: [name], MD;  Location: [facility] ENDOSCOPY;  Service: Gastroenterology;  Laterality: N/A;  . EYE SURGERY    . HIP ARTHROPLASTY Right 03/29/2020   Procedure: ARTHROPLASTY BIPOLAR HIP (HEMIARTHROPLASTY);  Surgeon: Corky Mull, MD;  Location: [facility] ORS;  Service: Orthopedics;  Laterality: Right;  . INTRAVASCULAR PRESSURE WIRE/FFR STUDY N/A 03/11/2017   Procedure: INTRAVASCULAR PRESSURE WIRE/FFR STUDY;  Surgeon: Leonie Man, MD;  Location: [place] [facility];  Service: Cardiovascular;  Laterality: N/A;  . INTRAVASCULAR ULTRASOUND/IVUS N/A 03/11/2017   Procedure: Intravascular Ultrasound/IVUS;  Surgeon: Leonie Man, MD;  Location: [place] [facility];  Service: Cardiovascular;  Laterality: N/A;  . LEFT HEART CATH AND CORONARY ANGIOGRAPHY N/A 03/05/2017   Procedure:  LEFT HEART CATH AND CORONARY ANGIOGRAPHY;  Surgeon: Isaias Cowman, MD;  Location: [place] [facility];  Service: Cardiovascular;  Laterality: N/A;  . PACEMAKER IMPLANT    . pacemaker/defibrillator Left     Prior to Admission medications   Medication Sig Start Date End Date Taking? Authorizing Provider  allopurinol (ZYLOPRIM) 100 MG tablet Take 50 mg by mouth daily.    [provider]  aspirin EC 81 MG tablet Take 81 mg by mouth at bedtime.     [provider]  azithromycin (ZITHROMAX) 250 MG tablet Take one tablet daily X 5 days. 06/23/20   Jacquelin Hawking, NP  chlorpheniramine-HYDROcodone (TUSSIONEX) 10-8 MG/5ML SUER Take 5 mLs by mouth every 12 (twelve) hours as needed for cough. 06/07/20   Lloyd Huger, MD  colchicine 0.6 MG tablet Take 0.6 mg by mouth 2 (two) times daily. 10/08/19   [provider]  dexamethasone (DECADRON) 4 MG tablet Take 5 tablets (20 mg total) by mouth daily. Take the day after Velcade on days 2,5,9,12. Take with breakfast 06/13/20   Lloyd Huger, MD  Ensure Max Protein (ENSURE MAX PROTEIN) LIQD Take 330 mLs (11 oz total) by mouth 2 (two) times daily between meals. 04/03/20   [name], MD  fexofenadine (ALLEGRA) 180 MG tablet Take 180 mg by mouth daily.    [provider]  FLUoxetine (PROZAC) 10 MG capsule Take 10 mg by mouth daily. 06/29/19   [provider]  folic acid (FOLVITE) 1 MG tablet folic acid 1 mg tablet    [provider]  hydrocortisone (ANUSOL-HC) 2.5 % rectal cream Apply 1 application topically 2 (two) times daily. 02/12/20   [provider]  Lactulose 20 GM/30ML SOLN Take 30 mLs (20 g total) by mouth in the morning and at bedtime. 06/23/20   Jacquelin Hawking, NP  levothyroxine (SYNTHROID) 75 MCG tablet Take 1 tablet (75 mcg total) by mouth daily before breakfast. On Monday, Wednesday and Friday 04/03/20   [name], MD  magnesium oxide (MAG-OX) 400 MG tablet Take 200  mg by mouth daily.    [provider]  mexiletine (MEXITIL) 200 MG capsule Take 1 capsule (200 mg total) by mouth every 12 (twelve) hours. 03/14/17   Patsey Berthold, NP  midodrine (PROAMATINE) 10 MG tablet Take 1 tablet (10 mg total) by mouth 2 (two) times daily with a meal. 04/03/20   [name], MD  OXYGEN Inhale 2 L into the lungs continuous.    [provider]  pantoprazole (PROTONIX) 40 MG tablet Take 40 mg by mouth daily.  05/16/19   [provider]  polyethylene glycol (MIRALAX) 17 g packet Take 17 g by mouth daily. 06/18/20   Nance Pear, MD  Prenatal Vit-Fe Fumarate-FA (PRENATAL MULTIVITAMIN) TABS tablet Take 1 tablet by mouth daily.    [provider]  simvastatin (ZOCOR) 40 MG tablet Take 20 mg by mouth at bedtime.     [provider]  spironolactone (ALDACTONE) 25 MG tablet Take 25 mg by mouth daily.     [provider]  sucralfate (CARAFATE) 1 g tablet Take 1 tablet by mouth 3 (three) times daily before meals. 09/15/19 09/14/20  [provider]  torsemide (DEMADEX) 20 MG tablet Take 2 tablets (40 mg total) by mouth daily. Future refills need to come from kidney doctor 02/21/20   Alisa Graff, FNP  traMADol (ULTRAM) 50 MG tablet Take 1 tablet (50 mg total) by mouth every 6 (six) hours as needed for moderate pain. 04/03/20   [name], MD  traZODone (DESYREL) 50 MG tablet Take 50 mg by mouth at bedtime.    [provider]    Allergies Celebrex [celecoxib], Glipizide, Levaquin [levofloxacin in d5w], Levofloxacin, Lisinopril, Sulfa antibiotics, Metformin, and Penicillins  Family History  Problem Relation Age of Onset  . Hypertension Father   . Heart attack Father   . Depression Sister   . Depression Brother   . Depression Brother     Social History Social History   Tobacco Use  . Smoking status: Current Every Day Smoker    Packs/day: 0.25    Types: E-cigarettes, Cigarettes  . Smokeless tobacco:  Never Used  Vaping Use  . Vaping Use: Former  Substance Use Topics  . Alcohol use: Not Currently    Comment: occasionally  . Drug use: Yes    Comment: prescribed pain meds    Review of Systems  Constitutional: No fever/chills.  Positive for possible syncope and/or fall Eyes: No visual changes. ENT: No sore throat. Cardiovascular: Denies chest pain. Respiratory: Denies shortness of breath. Gastrointestinal: No abdominal pain.  No nausea, no vomiting.  No diarrhea.  No constipation. Genitourinary: Negative for dysuria. Musculoskeletal: Negative for back pain. Skin: Negative for rash. Neurological: Negative for headaches, focal weakness or numbness.  ____________________________________________   PHYSICAL EXAM:  VITAL SIGNS: Vitals:   06/25/20 1400 06/25/20 1430  BP: 116/72 (!) 92/57  Pulse: 71 72  Resp: 14 19  Temp:    SpO2: 96% 93%     Constitutional: Alert and oriented. Well appearing and in no acute distress.  Pleasant and conversational in full sentences.  Follows commands in all 4 extremities. Eyes: Conjunctivae are normal. PERRL. EOMI. Head: Atraumatic. Nose: No congestion/rhinnorhea. Mouth/Throat: Mucous membranes are moist.  Oropharynx non-erythematous. Neck: No stridor. No cervical spine tenderness to palpation. Cardiovascular: Normal rate, regular rhythm. Grossly normal heart sounds.  Good peripheral circulation. Respiratory: Normal respiratory effort.  No retractions. Lungs CTAB. Gastrointestinal: Soft , nondistended, nontender to palpation. No CVA tenderness. Musculoskeletal: No lower extremity tenderness nor edema.  No joint effusions. Palpable ICD to the left chest without dehiscence, erythema or induration. Superficial skin tear to the left shin and right lateral calf without discrete laceration requiring repair.  Hemostatic. Neurologic:  Normal speech and language. No gross focal neurologic deficits are appreciated.  Cranial nerves II through XII  intact 5/5 strength and sensation in all 4 extremities Skin:  Skin is warm, dry and intact. No rash noted. Psychiatric: Mood and affect are normal. Speech and behavior are normal.  ____________________________________________   LABS (all labs ordered are listed, but only abnormal results are displayed)  Labs Reviewed  BASIC METABOLIC PANEL - Abnormal; Notable for the following components:      Result Value   Chloride 97 (*)    Glucose, Bld 111 (*)    BUN 35 (*)    Creatinine, Ser 1.71 (*)    Calcium 8.6 (*)    GFR, Estimated 30 (*)    All other components within normal limits  CBC - Abnormal; Notable for the following components:   WBC 14.9 (*)    RBC 2.92 (*)    Hemoglobin 9.1 (*)    HCT 28.8 (*)    RDW 17.4 (*)    Platelets 113 (*)    nRBC 0.7 (*)    All other components within normal limits  URINALYSIS, COMPLETE (UACMP) WITH MICROSCOPIC - Abnormal; Notable for the following components:   Color, Urine YELLOW (*)    APPearance CLEAR (*)    pH 9.0 (*)    Leukocytes,Ua SMALL (*)    Bacteria, UA RARE (*)    All other components within normal limits  MAGNESIUM - Abnormal; Notable for the following components:   Magnesium 3.1 (*)    All other components within normal limits  TROPONIN I (HIGH SENSITIVITY) - Abnormal; Notable for the following components:   Troponin I (High Sensitivity) 34 (*)    All other components within normal limits  TROPONIN I (HIGH SENSITIVITY) - Abnormal; Notable for the following components:   Troponin I (High Sensitivity) 34 (*)    All other components within normal limits  SARS CORONAVIRUS 2 (TAT 6-24 HRS)   ____________________________________________  12 Lead EKG  Atrially paced rhythm, rate of 70 bpm.  Normal axis and intervals.  No evidence of acute ischemia. ____________________________________________  RADIOLOGY  ED MD interpretation: CT head reviewed by me with evidence of subarachnoid hemorrhage without midline shift  Official  radiology report(s): CT Angio Head W or Wo Contrast  Result Date: 06/25/2020 CLINICAL DATA:  80 year old female with subarachnoid hemorrhage after fall. Syncope. EXAM: CT ANGIOGRAPHY HEAD TECHNIQUE: Multidetector CT imaging of the head was performed using the standard protocol during bolus administration of intravenous contrast. Multiplanar CT image reconstructions and MIPs were obtained to evaluate the vascular anatomy. CONTRAST:  69m OMNIPAQUE IOHEXOL 350 MG/ML SOLN COMPARISON:  Head CT 0931 hours today.  Head CT 03/28/2020. FINDINGS: Posterior circulation: Dominant distal left vertebral artery. Mild left V4 calcified plaque without stenosis. Normal left PICA origin, patent vertebrobasilar junction. Diminutive right V4 vertebral segment, although right PICA origin appears to remain patent. Patent basilar artery without irregularity or stenosis. Basilar tip, SCA and PCA origins appear normal. Posterior communicating arteries are diminutive or absent. Mildly tortuous right P1 segment. Bilateral PCA branches remain within normal limits despite adjacent subarachnoid hemorrhage in the ambient cisterns. No posterior circulation aneurysm identified. Anterior circulation: Both ICA siphons are patent. Right ICA ectasias a and mild calcified plaque just below the skull base without stenosis (series 6, image 13). Mild to moderate bilateral ICA siphon calcified plaque. Mild to moderate left cavernous segment stenosis. No significant right siphon stenosis. Ophthalmic artery origins and carotid termini appear normal. Patent MCA and ACA origins. Anterior communicating artery and bilateral ACA branches are within normal limits. Left MCA M1 segment, bifurcation, and left MCA branches are within normal limits. Right MCA M1 segment, bifurcation, and right MCA branches are within normal limits. Venous sinuses: Early contrast timing, patency not well evaluated. Subarachnoid hemorrhage about the vein of Galen and internal cerebral  veins. Anatomic variants: Dominant left and diminutive right distal vertebral arteries. Other findings: No intracranial mass effect or ventriculomegaly from the earlier plain CT. Review of the MIP images confirms the above findings IMPRESSION: 1. Intracranial CTA is negative for aneurysm. 2. Calcified plaque involving both ICA siphons and the dominant left vertebral artery. Up to moderate left ICA cavernous segment stenosis. 3. Grossly stable subarachnoid hemorrhage in the ambient and quadrigeminal plate cisterns since 0931 hours. Electronically Signed   By: Genevie Ann M.D.   On: 06/25/2020 10:59   CT Head Wo Contrast  Result Date: 06/25/2020 CLINICAL DATA:  Syncope with fall. EXAM: CT HEAD WITHOUT CONTRAST TECHNIQUE: Contiguous axial images were obtained from the base of the skull through the vertex without intravenous contrast. COMPARISON:  CT head dated March 28, 2020. FINDINGS: Brain: Acute small volume subarachnoid hemorrhage predominantly in the quadrigeminal and ambient cisterns. Trace subarachnoid hemorrhage in the pericallosal cistern along the anterior falx and along the right inferior frontal lobe adjacent to the old infarct. No evidence of acute infarction, hydrocephalus, or mass lesion/mass effect. Stable atrophy and chronic microvascular ischemic changes. Vascular: Calcified atherosclerosis at the skullbase. No hyperdense vessel. Skull: Normal. Negative for fracture or focal lesion. Sinuses/Orbits: No acute finding. Other: None. IMPRESSION: 1. Acute small volume predominantly perimesencephalic subarachnoid hemorrhage which is classically related to an underlying venous bleed. Alternatively, this could be post-traumatic. Underlying aneurysm is thought to be less likely, however CTA of the head is recommended to exclude aneurysm. Critical Value/emergent results were called by telephone at the time of interpretation on 06/25/2020 at 09:58 am to provider [facility] , who verbally acknowledged these  results. Electronically Signed   By: Titus Dubin M.D.   On: 06/25/2020 10:05   DG Chest Portable 1 View  Result Date: 06/25/2020 CLINICAL DATA:  Syncopal episode EXAM: PORTABLE CHEST 1 VIEW COMPARISON:  03/28/2020 chest radiograph. FINDINGS: Stable configuration of 2 lead left subclavian ICD. Stable cardiomediastinal silhouette with mild cardiomegaly. No pneumothorax. No pleural effusion. No pulmonary edema. Mild bibasilar scarring versus atelectasis. IMPRESSION: 1. Stable mild cardiomegaly without pulmonary edema. 2. Mild bibasilar scarring versus atelectasis. Electronically Signed   By: Ilona Sorrel M.D.   On: 06/25/2020 10:00    ____________________________________________   PROCEDURES and INTERVENTIONS  Procedure(s) performed (including Critical Care):  .1-3 Lead EKG  Interpretation Performed by: Vladimir Crofts, MD Authorized by: Vladimir Crofts, MD     Interpretation: normal     ECG rate:  70   ECG rate assessment: normal     Rhythm: paced     Ectopy: none     Conduction: normal   .Critical Care Performed by: Vladimir Crofts, MD Authorized by: Vladimir Crofts, MD   Critical care provider statement:    Critical care time (minutes):  45   Critical care was necessary to treat or prevent imminent or life-threatening deterioration of the following conditions:  CNS failure or compromise   Critical care was time spent personally by me on the following activities:  Discussions with consultants, evaluation of patient's response to treatment, examination of patient, ordering and performing treatments and interventions, ordering and review of laboratory studies, ordering and review of radiographic studies, pulse oximetry, re-evaluation of patient's condition, obtaining history from patient or surrogate and review of old charts    Medications  lactated ringers bolus 500 mL (0 mLs Intravenous Stopped 06/25/20 1124)  butalbital-acetaminophen-caffeine (FIORICET) 50-325-40 MG per tablet 2 tablet (2  tablets Oral Given 06/25/20 1025)  iohexol (OMNIPAQUE) 350 MG/ML injection 60 mL (60 mLs Intravenous Contrast Given 06/25/20 1035)    ____________________________________________   MDM / ED COURSE   80 year old woman on ASA 35 and multiple other medical comorbidities, presents to the ED after being found down after syncope versus fall, with evidence of subarachnoid hemorrhage and ultimately necessitating transfer to tertiary care facility.  Normal vitals on room air.  Exam reassuring without evidence of neurovascular deficits, distress or significant trauma.  She has a couple skin tears, but no significant deformities, laceration or signs of open injuries.  CT head obtained and demonstrates SAH without shift of possible posttraumatic versus aneurysmal etiology.  Follow-up CTA demonstrates no aneurysm.  Neurosurgery, Dr. Lacinda Axon, has evaluated the patient and recommends transfer to tertiary care facility due to her high risk of developing hydrocephalus.  I have initiated the transfer process by speaking to [facility] transfer center and awaiting that call back.  We will continue to arrange transfer, and patient may need to be signed out to my oncoming colleague to finalize these arrangements.   Clinical Course as of 06/25/20 1454  Sun Jun 25, 2020  1007 Call from rads regarding SAH on CT head, recommending CTA to eval aneurysm [DS]  1014 I speak with Dr. Lacinda Axon, NSGY on call, also recommends CTA [DS]  1017 Educated patient on [facility] and need for CTA head. [DS]  [place] called RN back, [place], and told her that the patient's device check was reassuring without dysrhythmias or shocks.  Awaiting faxed report [DS]  1155 Talk to Dr. Lacinda Axon, he's on his way to the ED to evaluate the patient.  [DS]  1234 Dr. Lacinda Axon is currently evaluating the patient.  He pulls me aside and indicates that she will likely need transfer due to high likelihood of hydrocephalus considering the anatomy of her bleed [DS]  1354 Callback  from [place] transfer center,I speak with NSGY Dr. Francesca Oman. ED to ED transfer [DS]    Clinical Course User Index [DS] Vladimir Crofts, MD    ____________________________________________   FINAL CLINICAL IMPRESSION(S) / ED DIAGNOSES  Final diagnoses:  SAH (subarachnoid hemorrhage) ([place])  Syncope and collapse  Acute post-traumatic headache, not intractable     ED Discharge Orders    None       Jaeshaun Riva Tamala Julian   Note:  This document was prepared using  [employer] forces training and education officer and may include unintentional dictation errors.   Vladimir Crofts, MD 06/25/20 1353    Vladimir Crofts, MD 06/25/20 [phone]

## 2020-06-25 NOTE — ED Notes (Signed)
Report called to Designer, fashion/clothing at [employer]

## 2020-06-25 NOTE — ED Notes (Addendum)
md Tamala Julian approved for patient to eat. Pt given a meal tray.

## 2020-06-25 NOTE — ED Notes (Signed)
[facility] at bedside to transfer patient.

## 2020-06-25 NOTE — ED Notes (Signed)
Patient is resting comfortably. 

## 2020-06-25 NOTE — ED Notes (Signed)
With patients verbal consent. Daughter lynn called and updated on status. Pt to be transferred to another facility for care.

## 2020-06-25 NOTE — ED Notes (Signed)
Pt given water per Dr Tamala Julian

## 2020-06-25 NOTE — ED Triage Notes (Signed)
Pt arrives via EMS from home after having a syncopal episode in her kitchen- per EMS pt does not remember being in the kitchen, but daughter was at home with her and heard her fall- pt was recently released from this hospital after having a SBO

## 2020-06-25 NOTE — ED Notes (Signed)
Daughter Heather Peterson called and updated that patient will be transferred within the hour

## 2020-06-25 NOTE — ED Notes (Signed)
Pt taken for scans 

## 2020-06-25 NOTE — ED Notes (Signed)
This RN spoke to Shanon Brow from [employer] [facility]. He states will be here later this evening to pick up patient closer to next shift.   Report to be called to [facility] at [phone]. This will be ED to ED. Coordinator: Judson Roch Call report once transport is here to pick up patient.

## 2020-06-26 DIAGNOSIS — S065XAA Traumatic subdural hemorrhage with loss of consciousness status unknown, initial encounter: Secondary | ICD-10-CM | POA: Insufficient documentation

## 2020-06-29 ENCOUNTER — Telehealth: Payer: Self-pay | Admitting: Internal Medicine

## 2020-06-29 NOTE — Telephone Encounter (Signed)
Patient called back to state she has moved to [facility] cardiology and is not in need of a follow up with [place], recall will be deleted

## 2020-07-04 ENCOUNTER — Inpatient Hospital Stay: Payer: Medicare Other

## 2020-07-04 ENCOUNTER — Inpatient Hospital Stay: Payer: Medicare Other | Admitting: Oncology

## 2020-07-04 DIAGNOSIS — E440 Moderate protein-calorie malnutrition: Secondary | ICD-10-CM | POA: Insufficient documentation

## 2020-07-07 ENCOUNTER — Inpatient Hospital Stay: Payer: Medicare Other

## 2020-07-08 NOTE — Progress Notes (Signed)
[place]  [phone] Fax:([phone]) [phone]  ID: Heather Peterson OB: August 06, 1940  MR#: [id number]  LTJ#:[id number]  [name], MD as PCP - General (Family Medicine) Lloyd Huger, MD as Consulting Physician (Oncology)  CHIEF COMPLAINT: Multiple myeloma, subdural hematoma.  INTERVAL HISTORY: Patient returns to clinic today for further evaluation and discussion of whether or not to reinitiate chemotherapy.  She recently lost consciousness, had a fall, and was found to have a subdural hematoma.  This required transfer to [facility] for monitoring.  She continues to have significant weakness and fatigue, but does not report any specific neurologic symptoms.  She continues to have pain, but does not take her narcotic regularly secondary to fear constipation.  She has chronic shortness of breath and requires oxygen 24 hours/day. She has no neurologic complaints.  She denies any fevers.  She has a poor appetite.  She denies any chest pain, cough, or hemoptysis. She denies any nausea, vomiting, constipation, or diarrhea. She has no melena or hematochezia.  She has no urinary complaints.  Patient feels generally terrible, but offers no further specific complaints today.  REVIEW OF SYSTEMS:   Review of Systems  Constitutional: Positive for malaise/fatigue. Negative for fever.  Respiratory: Positive for shortness of breath. Negative for cough and hemoptysis.   Cardiovascular: Negative.  Negative for chest pain and leg swelling.  Gastrointestinal: Negative.  Negative for abdominal pain, diarrhea, melena and nausea.  Genitourinary: Negative.  Negative for dysuria and hematuria.  Musculoskeletal: Positive for back pain and joint pain.  Skin: Negative.  Negative for rash.  Neurological: Positive for dizziness and weakness. Negative for focal weakness and headaches.  Psychiatric/Behavioral: Negative.  The patient is not nervous/anxious.      As per HPI. Otherwise, a complete review of systems is negative.  PAST MEDICAL HISTORY: Past Medical History:  Diagnosis Date  . Anxiety   . Chronic combined systolic and diastolic CHF (congestive heart failure) ([place])   . Chronic kidney disease   . Coronary artery disease   . Depression   . Diabetes mellitus without complication ([place])   . Diabetes mellitus, type II ([place])   . Hypertension   . MI (myocardial infarction) ([place])    x 5  . Pacemaker   . 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[place])   . Prolonged Q-T interval on ECG   . Thyroid disease     PAST SURGICAL HISTORY: Past Surgical History:  Procedure Laterality Date  . CENTRAL LINE INSERTION  03/11/2017   Procedure: CENTRAL LINE INSERTION;  Surgeon: Leonie Man, MD;  Location: [place] [facility];  Service: Cardiovascular;;  . CHOLECYSTECTOMY    . COLONOSCOPY WITH PROPOFOL N/A 09/01/2019   Procedure: COLONOSCOPY WITH PROPOFOL;  Surgeon: Toledo, Benay Pike, MD;  Location: [facility] ENDOSCOPY;  Service: Gastroenterology;  Laterality: N/A;  . CORONARY STENT INTERVENTION W/IMPELLA N/A 03/11/2017   Procedure: Coronary Stent Intervention w/Impella;  Surgeon: Leonie Man, MD;  Location: [place] [facility];  Service: Cardiovascular;  Laterality: N/A;  . ESOPHAGOGASTRODUODENOSCOPY (EGD) WITH PROPOFOL N/A 09/01/2019   Procedure: ESOPHAGOGASTRODUODENOSCOPY (EGD) WITH PROPOFOL;  Surgeon: Toledo, Benay Pike, MD;  Location: [facility] ENDOSCOPY;  Service: Gastroenterology;  Laterality: N/A;  . ESOPHAGOGASTRODUODENOSCOPY (EGD) WITH PROPOFOL N/A 09/08/2019   Procedure: ESOPHAGOGASTRODUODENOSCOPY (EGD) WITH PROPOFOL;  Surgeon: [name], MD;  Location: [facility] ENDOSCOPY;  Service: Gastroenterology;  Laterality: N/A;  . EYE SURGERY    . HIP ARTHROPLASTY Right 03/29/2020   Procedure: ARTHROPLASTY BIPOLAR  HIP (HEMIARTHROPLASTY);  Surgeon: Corky Mull, MD;  Location: [facility] ORS;  Service: Orthopedics;  Laterality: Right;  . INTRAVASCULAR PRESSURE WIRE/FFR  STUDY N/A 03/11/2017   Procedure: INTRAVASCULAR PRESSURE WIRE/FFR STUDY;  Surgeon: Leonie Man, MD;  Location: [place] [facility];  Service: Cardiovascular;  Laterality: N/A;  . INTRAVASCULAR ULTRASOUND/IVUS N/A 03/11/2017   Procedure: Intravascular Ultrasound/IVUS;  Surgeon: Leonie Man, MD;  Location: [place] [facility];  Service: Cardiovascular;  Laterality: N/A;  . LEFT HEART CATH AND CORONARY ANGIOGRAPHY N/A 03/05/2017   Procedure: LEFT HEART CATH AND CORONARY ANGIOGRAPHY;  Surgeon: Isaias Cowman, MD;  Location: [place] [facility];  Service: Cardiovascular;  Laterality: N/A;  . PACEMAKER IMPLANT    . pacemaker/defibrillator Left     FAMILY HISTORY: Family History  Problem Relation Age of Onset  . Hypertension Father   . Heart attack Father   . Depression Sister   . Depression Brother   . Depression Brother     ADVANCED DIRECTIVES (Y/N):  N  HEALTH MAINTENANCE: Social History   Tobacco Use  . Smoking status: Current Every Day Smoker    Packs/day: 0.25    Types: E-cigarettes, Cigarettes  . Smokeless tobacco: Never Used  Vaping Use  . Vaping Use: Former  Substance Use Topics  . Alcohol use: Not Currently    Comment: occasionally  . Drug use: Yes    Comment: prescribed pain meds     Colonoscopy:  PAP:  Bone density:  Lipid panel:  Allergies  Allergen Reactions  . Celebrex [Celecoxib] Anaphylaxis  . Glipizide Anaphylaxis  . Levaquin [Levofloxacin In D5w] Other (See Comments)    Heart arrhthymias  . Levofloxacin Other (See Comments) and Palpitations    ICD fired  . Lisinopril Swelling    Lip and facial swelling  . Sulfa Antibiotics Other (See Comments) and Anaphylaxis    Reaction: unknown  . Metformin Other (See Comments)    Gi tolerance   . Penicillins Rash and Other (See Comments)    Has patient had a PCN reaction causing immediate rash, facial/tongue/throat swelling, SOB or lightheadedness with hypotension: Unknown Has patient had a PCN  reaction causing severe rash involving mucus membranes or skin necrosis: No Has patient had a PCN reaction that required hospitalization: No Has patient had a PCN reaction occurring within the last 10 years: No If all of the above answers are "NO", then may proceed with Cephalosporin use.     Current Outpatient Medications  Medication Sig Dispense Refill  . allopurinol (ZYLOPRIM) 100 MG tablet Take 50 mg by mouth daily.    Marland Kitchen aspirin EC 81 MG tablet Take 81 mg by mouth at bedtime.     Marland Kitchen atorvastatin (LIPITOR) 40 MG tablet Take by mouth.    . butalbital-acetaminophen-caffeine (FIORICET) 50-325-40 MG tablet Take by mouth.    . colchicine 0.6 MG tablet Take 0.6 mg by mouth 2 (two) times daily.    Marland Kitchen docusate sodium (COLACE) 100 MG capsule Take 100 mg by mouth 2 (two) times daily.    . fexofenadine (ALLEGRA) 180 MG tablet Take 180 mg by mouth daily.    Marland Kitchen FLUoxetine (PROZAC) 10 MG capsule Take 10 mg by mouth daily.    . magnesium oxide (MAG-OX) 400 MG tablet Take 200 mg by mouth daily.    Marland Kitchen mexiletine (MEXITIL) 200 MG capsule Take 1 capsule (200 mg total) by mouth every 12 (twelve) hours. 60 capsule 1  . midodrine (PROAMATINE) 10 MG tablet Take 1 tablet (10  mg total) by mouth 2 (two) times daily with a meal.    . ondansetron (ZOFRAN) 8 MG tablet Take by mouth every 8 (eight) hours as needed for nausea or vomiting.    . OXYGEN Inhale 2 L into the lungs continuous.    . pantoprazole (PROTONIX) 40 MG tablet Take 40 mg by mouth daily.     . Prenatal Vit-Fe Fumarate-FA (PRENATAL MULTIVITAMIN) TABS tablet Take 1 tablet by mouth daily.    . simvastatin (ZOCOR) 40 MG tablet Take 20 mg by mouth at bedtime.   2  . spironolactone (ALDACTONE) 25 MG tablet Take 25 mg by mouth daily.     . sucralfate (CARAFATE) 1 g tablet Take 1 tablet by mouth 3 (three) times daily before meals.    . torsemide (DEMADEX) 20 MG tablet Take 2 tablets (40 mg total) by mouth daily. Future refills need to come from kidney doctor 60  tablet 1  . traZODone (DESYREL) 50 MG tablet Take 50 mg by mouth at bedtime.    Marland Kitchen azithromycin (ZITHROMAX) 250 MG tablet Take one tablet daily X 5 days. (Patient not taking: Reported on 07/11/2020) 4 each 0  . chlorpheniramine-HYDROcodone (TUSSIONEX) 10-8 MG/5ML SUER Take 5 mLs by mouth every 12 (twelve) hours as needed for cough. (Patient not taking: Reported on 07/11/2020) 140 mL 0  . dexamethasone (DECADRON) 4 MG tablet Take 5 tablets (20 mg total) by mouth daily. Take the day after Velcade on days 2,5,9,12. Take with breakfast (Patient not taking: Reported on 07/11/2020) 40 tablet 3  . Ensure Max Protein (ENSURE MAX PROTEIN) LIQD Take 330 mLs (11 oz total) by mouth 2 (two) times daily between meals. (Patient not taking: Reported on 07/11/2020)    . folic acid (FOLVITE) 1 MG tablet folic acid 1 mg tablet (Patient not taking: Reported on 07/11/2020)    . HYDROcodone-acetaminophen (NORCO/VICODIN) 5-325 MG tablet SMARTSIG:1 Tablet(s) By Mouth Every 12 Hours PRN    . hydrocortisone (ANUSOL-HC) 2.5 % rectal cream Apply 1 application topically 2 (two) times daily. (Patient not taking: Reported on 07/11/2020)    . Lactulose 20 GM/30ML SOLN Take 30 mLs (20 g total) by mouth in the morning and at bedtime. (Patient not taking: Reported on 07/11/2020) 450 mL 1  . levothyroxine (SYNTHROID) 75 MCG tablet Take 1 tablet (75 mcg total) by mouth daily before breakfast. On Monday, Wednesday and Friday (Patient not taking: Reported on 07/11/2020)    . polyethylene glycol (MIRALAX) 17 g packet Take 17 g by mouth daily. (Patient not taking: Reported on 07/11/2020) 14 each 0  . traMADol (ULTRAM) 50 MG tablet Take 1 tablet (50 mg total) by mouth every 6 (six) hours as needed for moderate pain. (Patient not taking: Reported on 07/11/2020) 30 tablet 0   No current facility-administered medications for this visit.    OBJECTIVE: Vitals:   07/11/20 1358  BP: (!) 94/56  Pulse: 83  Resp: 18  Temp: 97.8 F (36.6 C)  SpO2: 98%     Body  mass index is 26.37 kg/m.    ECOG FS:2 - Symptomatic, <50% confined to bed  General: Well-developed, well-nourished, no acute distress.  Sitting in a wheelchair. Eyes: Pink conjunctiva, anicteric sclera. HEENT: Normocephalic, moist mucous membranes. Lungs: No audible wheezing or coughing. Heart: Regular rate and rhythm. Abdomen: Soft, nontender, no obvious distention. Musculoskeletal: No edema, cyanosis, or clubbing. Neuro: Alert, answering all questions appropriately. Cranial nerves grossly intact. Skin: No rashes or petechiae noted. Psych: Normal affect.   LAB  RESULTS:  Lab Results  Component Value Date   NA 138 07/11/2020   K 4.1 07/11/2020   CL 101 07/11/2020   CO2 24 07/11/2020   GLUCOSE 104 (H) 07/11/2020   BUN 24 (H) 07/11/2020   CREATININE 1.42 (H) 07/11/2020   CALCIUM 9.4 07/11/2020   PROT 7.1 07/11/2020   ALBUMIN 4.2 07/11/2020   AST 21 07/11/2020   ALT 17 07/11/2020   ALKPHOS 103 07/11/2020   BILITOT 0.6 07/11/2020   GFRNONAA 38 (L) 07/11/2020   GFRAA 30 (L) 09/09/2019    Lab Results  Component Value Date   WBC 7.0 07/11/2020   NEUTROABS 4.7 07/11/2020   HGB 10.1 (L) 07/11/2020   HCT 32.3 (L) 07/11/2020   MCV 101.3 (H) 07/11/2020   PLT 230 07/11/2020   Lab Results  Component Value Date   IRON 85 05/23/2020   TIBC 421 05/23/2020   IRONPCTSAT 20 05/23/2020   Lab Results  Component Value Date   FERRITIN 50 05/23/2020     STUDIES: CT Angio Head W or Wo Contrast  Result Date: 06/25/2020 CLINICAL DATA:  80 year old female with subarachnoid hemorrhage after fall. Syncope. EXAM: CT ANGIOGRAPHY HEAD TECHNIQUE: Multidetector CT imaging of the head was performed using the standard protocol during bolus administration of intravenous contrast. Multiplanar CT image reconstructions and MIPs were obtained to evaluate the vascular anatomy. CONTRAST:  37m OMNIPAQUE IOHEXOL 350 MG/ML SOLN COMPARISON:  Head CT 0931 hours today.  Head CT 03/28/2020. FINDINGS:  Posterior circulation: Dominant distal left vertebral artery. Mild left V4 calcified plaque without stenosis. Normal left PICA origin, patent vertebrobasilar junction. Diminutive right V4 vertebral segment, although right PICA origin appears to remain patent. Patent basilar artery without irregularity or stenosis. Basilar tip, SCA and PCA origins appear normal. Posterior communicating arteries are diminutive or absent. Mildly tortuous right P1 segment. Bilateral PCA branches remain within normal limits despite adjacent subarachnoid hemorrhage in the ambient cisterns. No posterior circulation aneurysm identified. Anterior circulation: Both ICA siphons are patent. Right ICA ectasias a and mild calcified plaque just below the skull base without stenosis (series 6, image 13). Mild to moderate bilateral ICA siphon calcified plaque. Mild to moderate left cavernous segment stenosis. No significant right siphon stenosis. Ophthalmic artery origins and carotid termini appear normal. Patent MCA and ACA origins. Anterior communicating artery and bilateral ACA branches are within normal limits. Left MCA M1 segment, bifurcation, and left MCA branches are within normal limits. Right MCA M1 segment, bifurcation, and right MCA branches are within normal limits. Venous sinuses: Early contrast timing, patency not well evaluated. Subarachnoid hemorrhage about the vein of Galen and internal cerebral veins. Anatomic variants: Dominant left and diminutive right distal vertebral arteries. Other findings: No intracranial mass effect or ventriculomegaly from the earlier plain CT. Review of the MIP images confirms the above findings IMPRESSION: 1. Intracranial CTA is negative for aneurysm. 2. Calcified plaque involving both ICA siphons and the dominant left vertebral artery. Up to moderate left ICA cavernous segment stenosis. 3. Grossly stable subarachnoid hemorrhage in the ambient and quadrigeminal plate cisterns since 0931 hours.  Electronically Signed   By: HGenevie AnnM.D.   On: 06/25/2020 10:59   CT Head Wo Contrast  Result Date: 06/25/2020 CLINICAL DATA:  Syncope with fall. EXAM: CT HEAD WITHOUT CONTRAST TECHNIQUE: Contiguous axial images were obtained from the base of the skull through the vertex without intravenous contrast. COMPARISON:  CT head dated March 28, 2020. FINDINGS: Brain: Acute small volume subarachnoid hemorrhage predominantly in the  quadrigeminal and ambient cisterns. Trace subarachnoid hemorrhage in the pericallosal cistern along the anterior falx and along the right inferior frontal lobe adjacent to the old infarct. No evidence of acute infarction, hydrocephalus, or mass lesion/mass effect. Stable atrophy and chronic microvascular ischemic changes. Vascular: Calcified atherosclerosis at the skullbase. No hyperdense vessel. Skull: Normal. Negative for fracture or focal lesion. Sinuses/Orbits: No acute finding. Other: None. IMPRESSION: 1. Acute small volume predominantly perimesencephalic subarachnoid hemorrhage which is classically related to an underlying venous bleed. Alternatively, this could be post-traumatic. Underlying aneurysm is thought to be less likely, however CTA of the head is recommended to exclude aneurysm. Critical Value/emergent results were called by telephone at the time of interpretation on 06/25/2020 at 09:58 am to provider [facility] , who verbally acknowledged these results. Electronically Signed   By: Titus Dubin M.D.   On: 06/25/2020 10:05   CT ABDOMEN PELVIS W CONTRAST  Result Date: 06/18/2020 CLINICAL DATA:  Abdominal pain constipation x5 days. EXAM: CT ABDOMEN AND PELVIS WITH CONTRAST TECHNIQUE: Multidetector CT imaging of the abdomen and pelvis was performed using the standard protocol following bolus administration of intravenous contrast. CONTRAST:  46m OMNIPAQUE IOHEXOL 300 MG/ML  SOLN COMPARISON:  CT abdomen pelvis July 16, 2019 FINDINGS: Lower chest: Cardiomegaly. Trace  pericardial fluid. Cardiac pacemaker wires noted in the right atrium and right ventricle. Slight blooming of the left ventricular apex with stable calcifications possibly reflecting sequela prior infarct. Hepatobiliary: No suspicious hepatic lesion. Prominent Riedel's lobe. Similar slight left lobe and caudate lobe hypertrophy. Cholecystectomy. Mild prominence of biliary tree, similar to prior likely reservoir effect. Pancreas: Unremarkable Spleen: Unremarkable Adrenals/Urinary Tract: Stable size of the low-attenuation bilateral adrenal masses, consistent with benign adenomas. Bilateral parenchymal scarring and moderate atrophy. No hydronephrosis. Bladder is predominantly decompressed limiting evaluation. No filling defects seen in the collecting system or proximal ureters on delayed imaging. Stomach/Bowel: Stomach is within normal limits. No suspicious small bowel wall thickening. There is a moderate burden of formed stool throughout the ascending and transverse colon, with fecalization of some distal loops of small bowel. The descending colon, sigmoid colon and rectum are predominantly decompressed. Colonic diverticulosis. Numerous radiopaque tablets throughout the colon. Vascular/Lymphatic: Extensive aortic and branch vessel atherosclerosis. No enlarged abdominal or pelvic lymph nodes. Reproductive: Uterus and bilateral adnexa are unremarkable. Other: No abdominopelvic ascites Musculoskeletal: Right hip arthroplasty. No acute osseous abnormality. IMPRESSION: 1. Moderate burden of formed stool throughout the ascending and transverse colon with fecalization of some distal loops of small bowel, consistent with constipation with slow transit. No evidence of obstruction. 2. 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 Suggest correlation with liver function tests in hepatic serology if not recently performed. 3. Unchanged bilateral low-density adrenal lesions consistent with benign adrenal adenomas. 4.  Cardiomegaly with trace pericardial fluid. 5. Aortic atherosclerosis. Aortic Atherosclerosis (ICD10-I70.0). Electronically Signed   By: JDahlia BailiffMD   On: 06/18/2020 23:13   DG Chest Portable 1 View  Result Date: 06/25/2020 CLINICAL DATA:  Syncopal episode EXAM: PORTABLE CHEST 1 VIEW COMPARISON:  03/28/2020 chest radiograph. FINDINGS: Stable configuration of 2 lead left subclavian ICD. Stable cardiomediastinal silhouette with mild cardiomegaly. No pneumothorax. No pleural effusion. No pulmonary edema. Mild bibasilar scarring versus atelectasis. IMPRESSION: 1. Stable mild cardiomegaly without pulmonary edema. 2. Mild bibasilar scarring versus atelectasis. Electronically Signed   By: JIlona SorrelM.D.   On: 06/25/2020 10:00   DG Abd 2 Views  Result Date: 06/23/2020 CLINICAL DATA:  Abdominal pain EXAM: ABDOMEN - 2 VIEW  COMPARISON:  None. FINDINGS: Moderate stool burden within the left colon. Nondilated loops of bowel (likely small bowel) in the left lower abdomen with air-fluid levels. No evidence of free air. Cholecystectomy clips. IMPRESSION: Nonspecific nondilated loops of small bowel in the left lower abdomen with air-fluid levels, which may reflect enteritis or early obstruction. Electronically Signed   By: Macy Mis M.D.   On: 06/23/2020 16:28    ASSESSMENT: Multiple myeloma, fractured right hip.  PLAN:    1.  Multiple myeloma: Bone marrow biopsy confirmed diagnosis with plasma cells up to 90% of biopsy.  Cytogenetics are reported as normal.  Metastatic bone survey from March 03, 2020 reviewed independently with multiple skeletal lucencies consistent with myeloma. Her M spike was initially 1.2, but now has trended down to 0.1. IgA levels have decreased from 2773 to 375.  Kappa and lambda free light chains are within normal limits.  Patient noted to have endorgan damage of renal insufficiency as well as persistent anemia. Initial plan was to give Velcade on days 1, 4, 8, and 11 along  with 10 mg Revlimid on days 1 through 14. Patient will also receive weekly 20 mg dexamethasone.  Revlimid was discontinued secondary to intolerance.  Patient last received single agent Velcade on June 20, 2020.  Given her recent fall with subdural hematoma and decreased performance status, will continue to hold single agent Velcade.  Return to clinic in 6 weeks for repeat laboratory work and further evaluation.   2.  Anemia: Chronic and unchanged.  Patient's hemoglobin is 10.1 today.  Iron stores are within normal limits.  Likely multifactorial with chronic renal insufficiency, underlying myeloma, as well as history of recent GI bleed.  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  Monitor. 3.  Chronic renal insufficiency: Chronic and unchanged.  Patient creatinine is 1.42.  Velcade does not need to be dosed reduced and renal dysfunction.  Monitor.   4.  Ascites: Patient does not complain of this today.  Continue follow-up with heart failure clinic as scheduled. 5.  Angiodysplastic lesions: Continue follow-up with GI as scheduled. 6. Constipation: Resolved. Continue stool softeners and MiraLAX as Monday. 7.  Right hip fracture: Improving.  Continue rehab and follow-up with orthopedics as scheduled. Continue Percocet as needed. 8. Thrombocytopenia: Resolved. 9.  Pain: Patient was instructed to take hydrocodone as needed and monitor symptoms of constipation closely.  Patient expressed understanding and was in agreement with this plan. She also understands that She can call clinic at any time with any questions, concerns, or complaints.    Lloyd Huger, MD   07/12/2020 2:13 PM

## 2020-07-11 ENCOUNTER — Encounter: Payer: Self-pay | Admitting: Oncology

## 2020-07-11 ENCOUNTER — Inpatient Hospital Stay: Payer: Medicare Other

## 2020-07-11 ENCOUNTER — Inpatient Hospital Stay: Payer: Medicare Other | Attending: Oncology

## 2020-07-11 ENCOUNTER — Inpatient Hospital Stay (HOSPITAL_BASED_OUTPATIENT_CLINIC_OR_DEPARTMENT_OTHER): Payer: Medicare Other | Admitting: Oncology

## 2020-07-11 VITALS — BP 94/56 | HR 83 | Temp 97.8°F | Resp 18 | Wt 153.6 lb

## 2020-07-11 DIAGNOSIS — Z9981 Dependence on supplemental oxygen: Secondary | ICD-10-CM | POA: Diagnosis not present

## 2020-07-11 DIAGNOSIS — Z9181 History of falling: Secondary | ICD-10-CM | POA: Diagnosis not present

## 2020-07-11 DIAGNOSIS — N189 Chronic kidney disease, unspecified: Secondary | ICD-10-CM | POA: Diagnosis not present

## 2020-07-11 DIAGNOSIS — D649 Anemia, unspecified: Secondary | ICD-10-CM | POA: Insufficient documentation

## 2020-07-11 DIAGNOSIS — F17210 Nicotine dependence, cigarettes, uncomplicated: Secondary | ICD-10-CM | POA: Insufficient documentation

## 2020-07-11 DIAGNOSIS — R0602 Shortness of breath: Secondary | ICD-10-CM | POA: Diagnosis not present

## 2020-07-11 DIAGNOSIS — C9000 Multiple myeloma not having achieved remission: Secondary | ICD-10-CM

## 2020-07-11 DIAGNOSIS — R52 Pain, unspecified: Secondary | ICD-10-CM | POA: Diagnosis not present

## 2020-07-11 LAB — COMPREHENSIVE METABOLIC PANEL
ALT: 17 U/L (ref 0–44)
AST: 21 U/L (ref 15–41)
Albumin: 4.2 g/dL (ref 3.5–5.0)
Alkaline Phosphatase: 103 U/L (ref 38–126)
Anion gap: 13 (ref 5–15)
BUN: 24 mg/dL — ABNORMAL HIGH (ref 8–23)
CO2: 24 mmol/L (ref 22–32)
Calcium: 9.4 mg/dL (ref 8.9–10.3)
Chloride: 101 mmol/L (ref 98–111)
Creatinine, Ser: 1.42 mg/dL — ABNORMAL HIGH (ref 0.44–1.00)
GFR, Estimated: 38 mL/min — ABNORMAL LOW (ref >60)
Glucose, Bld: 104 mg/dL — ABNORMAL HIGH (ref 70–99)
Potassium: 4.1 mmol/L (ref 3.5–5.1)
Sodium: 138 mmol/L (ref 135–145)
Total Bilirubin: 0.6 mg/dL (ref 0.3–1.2)
Total Protein: 7.1 g/dL (ref 6.5–8.1)

## 2020-07-11 LAB — CBC WITH DIFFERENTIAL/PLATELET
Abs Immature Granulocytes: 0.04 10*3/uL (ref 0.00–0.07)
Basophils Absolute: 0.1 10*3/uL (ref 0.0–0.1)
Basophils Relative: 1 %
Eosinophils Absolute: 0.1 10*3/uL (ref 0.0–0.5)
Eosinophils Relative: 2 %
HCT: 32.3 % — ABNORMAL LOW (ref 36.0–46.0)
Hemoglobin: 10.1 g/dL — ABNORMAL LOW (ref 12.0–15.0)
Immature Granulocytes: 1 %
Lymphocytes Relative: 19 %
Lymphs Abs: 1.3 10*3/uL (ref 0.7–4.0)
MCH: 31.7 pg (ref 26.0–34.0)
MCHC: 31.3 g/dL (ref 30.0–36.0)
MCV: 101.3 fL — ABNORMAL HIGH (ref 80.0–100.0)
Monocytes Absolute: 0.7 10*3/uL (ref 0.1–1.0)
Monocytes Relative: 10 %
Neutro Abs: 4.7 10*3/uL (ref 1.7–7.7)
Neutrophils Relative %: 67 %
Platelets: 230 10*3/uL (ref 150–400)
RBC: 3.19 MIL/uL — ABNORMAL LOW (ref 3.87–5.11)
RDW: 18.6 % — ABNORMAL HIGH (ref 11.5–15.5)
WBC: 7 10*3/uL (ref 4.0–10.5)
nRBC: 0 % (ref 0.0–0.2)

## 2020-07-11 NOTE — Progress Notes (Signed)
Patient here for oncology follow-up appointment, expresses concerns of headaches since recent fall and diarrhea.

## 2020-07-12 LAB — IGG, IGA, IGM
IgA: 375 mg/dL (ref 64–422)
IgG (Immunoglobin G), Serum: 211 mg/dL — ABNORMAL LOW (ref 586–1602)
IgM (Immunoglobulin M), Srm: 47 mg/dL (ref 26–217)

## 2020-07-12 LAB — KAPPA/LAMBDA LIGHT CHAINS
Kappa free light chain: 8.4 mg/L (ref 3.3–19.4)
Kappa, lambda light chain ratio: 0.81 (ref 0.26–1.65)
Lambda free light chains: 10.4 mg/L (ref 5.7–26.3)

## 2020-07-13 LAB — PROTEIN ELECTROPHORESIS, SERUM
A/G Ratio: 1.3 (ref 0.7–1.7)
Albumin ELP: 3.7 g/dL (ref 2.9–4.4)
Alpha-1-Globulin: 0.3 g/dL (ref 0.0–0.4)
Alpha-2-Globulin: 1.1 g/dL — ABNORMAL HIGH (ref 0.4–1.0)
Beta Globulin: 1.1 g/dL (ref 0.7–1.3)
Gamma Globulin: 0.4 g/dL (ref 0.4–1.8)
Globulin, Total: 2.8 g/dL (ref 2.2–3.9)
M-Spike, %: 0.3 g/dL — ABNORMAL HIGH
Total Protein ELP: 6.5 g/dL (ref 6.0–8.5)

## 2020-07-14 ENCOUNTER — Ambulatory Visit: Payer: Medicare Other

## 2020-07-24 ENCOUNTER — Other Ambulatory Visit: Payer: Self-pay | Admitting: *Deleted

## 2020-07-24 MED ORDER — HYDROCODONE-ACETAMINOPHEN 5-325 MG PO TABS: ORAL_TABLET | ORAL | 0 refills | Status: DC

## 2020-07-25 DIAGNOSIS — G479 Sleep disorder, unspecified: Secondary | ICD-10-CM | POA: Insufficient documentation

## 2020-07-25 DIAGNOSIS — Z8679 Personal history of other diseases of the circulatory system: Secondary | ICD-10-CM | POA: Insufficient documentation

## 2020-07-25 DIAGNOSIS — R262 Difficulty in walking, not elsewhere classified: Secondary | ICD-10-CM | POA: Insufficient documentation

## 2020-07-25 DIAGNOSIS — R110 Nausea: Secondary | ICD-10-CM | POA: Insufficient documentation

## 2020-08-17 ENCOUNTER — Other Ambulatory Visit: Payer: Self-pay | Admitting: *Deleted

## 2020-08-19 NOTE — Progress Notes (Signed)
[place]  [phone] [phone]) [phone]  ID: Angline Schweigert Waring OB: August 22, 1940  MR#: [id number]  SLP#:[id number]  [name], MD as PCP - General (Family Medicine) Lloyd Huger, MD as Consulting Physician (Oncology)  CHIEF COMPLAINT: Multiple myeloma, subdural hematoma.  INTERVAL HISTORY: Patient returns to clinic today for further evaluation and discussion on whether or not to reinitiate treatment.  She continues to have significant weakness and fatigue and is still recovering from her subdural hematoma.  She continues to have back pain associated with peripheral neuropathy.  She has no other neurologic complaints.  She has chronic shortness of breath and requires oxygen 24 hours/day.  She denies any fevers.  She has a fair appetite.  She denies any chest pain, cough, or hemoptysis. She denies any nausea, vomiting, constipation, or diarrhea. She has no melena or hematochezia.  She has no urinary complaints.  Patient offers no further specific complaints today.  REVIEW OF SYSTEMS:   Review of Systems  Constitutional: Positive for malaise/fatigue. Negative for fever.  Respiratory: Positive for shortness of breath. Negative for cough and hemoptysis.   Cardiovascular: Negative.  Negative for chest pain and leg swelling.  Gastrointestinal: Negative.  Negative for abdominal pain, diarrhea, melena and nausea.  Genitourinary: Negative.  Negative for dysuria and hematuria.  Musculoskeletal: Positive for back pain. Negative for joint pain.  Skin: Negative.  Negative for rash.  Neurological: Positive for sensory change and weakness. Negative for dizziness, focal weakness and headaches.  Psychiatric/Behavioral: Negative.  The patient is not nervous/anxious.     As per HPI. Otherwise, a complete review of systems is negative.  PAST MEDICAL HISTORY: Past Medical History:  Diagnosis Date  . Anxiety   . Chronic combined systolic  and diastolic CHF (congestive heart failure) ([place])   . Chronic kidney disease   . Coronary artery disease   . Depression   . Diabetes mellitus without complication ([place])   . Diabetes mellitus, type II ([place])   . Hypertension   . MI (myocardial infarction) ([place])    x 5  . Pacemaker   . 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[place])   . Prolonged Q-T interval on ECG   . Thyroid disease     PAST SURGICAL HISTORY: Past Surgical History:  Procedure Laterality Date  . CENTRAL LINE INSERTION  03/11/2017   Procedure: CENTRAL LINE INSERTION;  Surgeon: Leonie Man, MD;  Location: [place] [facility];  Service: Cardiovascular;;  . CHOLECYSTECTOMY    . COLONOSCOPY WITH PROPOFOL N/A 09/01/2019   Procedure: COLONOSCOPY WITH PROPOFOL;  Surgeon: Toledo, Benay Pike, MD;  Location: [facility];  Service: Gastroenterology;  Laterality: N/A;  . CORONARY STENT INTERVENTION W/IMPELLA N/A 03/11/2017   Procedure: Coronary Stent Intervention w/Impella;  Surgeon: Leonie Man, MD;  Location: [place] [facility];  Service: Cardiovascular;  Laterality: N/A;  . ESOPHAGOGASTRODUODENOSCOPY (EGD) WITH PROPOFOL N/A 09/01/2019   Procedure: ESOPHAGOGASTRODUODENOSCOPY (EGD) WITH PROPOFOL;  Surgeon: Toledo, Benay Pike, MD;  Location: [facility];  Service: Gastroenterology;  Laterality: N/A;  . ESOPHAGOGASTRODUODENOSCOPY (EGD) WITH PROPOFOL N/A 09/08/2019   Procedure: ESOPHAGOGASTRODUODENOSCOPY (EGD) WITH PROPOFOL;  Surgeon: [name], MD;  Location: [facility];  Service: Gastroenterology;  Laterality: N/A;  . EYE SURGERY    . HIP ARTHROPLASTY Right 03/29/2020   Procedure: ARTHROPLASTY BIPOLAR HIP (HEMIARTHROPLASTY);  Surgeon: Corky Mull, MD;  Location: [facility];  Service: Orthopedics;  Laterality: Right;  . INTRAVASCULAR PRESSURE WIRE/FFR STUDY N/A 03/11/2017   Procedure: INTRAVASCULAR  PRESSURE WIRE/FFR STUDY;  Surgeon: Leonie Man, MD;  Location: [place] [facility];  Service: Cardiovascular;  Laterality:  N/A;  . INTRAVASCULAR ULTRASOUND/IVUS N/A 03/11/2017   Procedure: Intravascular Ultrasound/IVUS;  Surgeon: Leonie Man, MD;  Location: [place] [facility];  Service: Cardiovascular;  Laterality: N/A;  . LEFT HEART CATH AND CORONARY ANGIOGRAPHY N/A 03/05/2017   Procedure: LEFT HEART CATH AND CORONARY ANGIOGRAPHY;  Surgeon: Isaias Cowman, MD;  Location: [place] [facility];  Service: Cardiovascular;  Laterality: N/A;  . PACEMAKER IMPLANT    . pacemaker/defibrillator Left     FAMILY HISTORY: Family History  Problem Relation Age of Onset  . Hypertension Father   . Heart attack Father   . Depression Sister   . Depression Brother   . Depression Brother     ADVANCED DIRECTIVES (Y/N):  N  HEALTH MAINTENANCE: Social History   Tobacco Use  . Smoking status: Current Every Day Smoker    Packs/day: 0.25    Types: E-cigarettes, Cigarettes  . Smokeless tobacco: Never Used  Vaping Use  . Vaping Use: Former  Substance Use Topics  . Alcohol use: Not Currently    Comment: occasionally  . Drug use: Yes    Comment: prescribed pain meds     Colonoscopy:  PAP:  Bone density:  Lipid panel:  Allergies  Allergen Reactions  . Celebrex [Celecoxib] Anaphylaxis  . Glipizide Anaphylaxis  . Levaquin [Levofloxacin In D5w] Other (See Comments)    Heart arrhthymias  . Levofloxacin Other (See Comments) and Palpitations    ICD fired  . Lisinopril Swelling    Lip and facial swelling  . Sulfa Antibiotics Other (See Comments) and Anaphylaxis    Reaction: unknown  . Metformin Other (See Comments)    Gi tolerance   . Penicillins Rash and Other (See Comments)    Has patient had a PCN reaction causing immediate rash, facial/tongue/throat swelling, SOB or lightheadedness with hypotension: Unknown Has patient had a PCN reaction causing severe rash involving mucus membranes or skin necrosis: No Has patient had a PCN reaction that required hospitalization: No Has patient had a PCN reaction  occurring within the last 10 years: No If all of the above answers are "NO", then may proceed with Cephalosporin use.     Current Outpatient Medications  Medication Sig Dispense Refill  . allopurinol (ZYLOPRIM) 100 MG tablet Take 50 mg by mouth daily.    Marland Kitchen aspirin EC 81 MG tablet Take 81 mg by mouth at bedtime.     . docusate sodium (COLACE) 100 MG capsule Take 100 mg by mouth 2 (two) times daily.    . Ensure Max Protein (ENSURE MAX PROTEIN) LIQD Take 330 mLs (11 oz total) by mouth 2 (two) times daily between meals.    . fexofenadine (ALLEGRA) 180 MG tablet Take 180 mg by mouth daily.    Marland Kitchen FLUoxetine (PROZAC) 10 MG capsule Take 10 mg by mouth daily.    Marland Kitchen gabapentin (NEURONTIN) 300 MG capsule Take 1 capsule (300 mg total) by mouth at bedtime. 30 capsule 2  . hydrocortisone (ANUSOL-HC) 2.5 % rectal cream Apply 1 application topically 2 (two) times daily.    . Lactulose 20 GM/30ML SOLN Take 30 mLs (20 g total) by mouth in the morning and at bedtime. 450 mL 1  . levothyroxine (SYNTHROID) 75 MCG tablet Take 1 tablet (75 mcg total) by mouth daily before breakfast. On Monday, Wednesday and Friday    . magnesium oxide (MAG-OX) 400 MG tablet Take  200 mg by mouth daily.    Marland Kitchen mexiletine (MEXITIL) 200 MG capsule Take 1 capsule (200 mg total) by mouth every 12 (twelve) hours. 60 capsule 1  . midodrine (PROAMATINE) 10 MG tablet Take 1 tablet (10 mg total) by mouth 2 (two) times daily with a meal.    . ondansetron (ZOFRAN) 8 MG tablet Take by mouth every 8 (eight) hours as needed for nausea or vomiting.    . OXYGEN Inhale 2 L into the lungs continuous.    . pantoprazole (PROTONIX) 40 MG tablet Take 40 mg by mouth daily.     . polyethylene glycol (MIRALAX) 17 g packet Take 17 g by mouth daily. 14 each 0  . Prenatal Vit-Fe Fumarate-FA (PRENATAL MULTIVITAMIN) TABS tablet Take 1 tablet by mouth daily.    . simvastatin (ZOCOR) 40 MG tablet Take 20 mg by mouth at bedtime.   2  . traZODone (DESYREL) 50 MG tablet  Take 50 mg by mouth at bedtime.    Marland Kitchen dexamethasone (DECADRON) 4 MG tablet Take 5 tablets (20 mg total) by mouth daily. Take the day after Velcade on days 2,5,9,12. Take with breakfast (Patient not taking: No sig reported) 40 tablet 3  . HYDROcodone-acetaminophen (NORCO/VICODIN) 5-325 MG tablet SMARTSIG:1 Tablet(s) By Mouth Every 12 Hours PRN 60 tablet 0   No current facility-administered medications for this visit.    OBJECTIVE: Vitals:   08/22/20 1423  BP: 100/68  Pulse: 84  Resp: 12  Temp: 98.2 F (36.8 C)     Body mass index is 25.13 kg/m.    ECOG FS:2 - Symptomatic, <50% confined to bed  General: Well-developed, well-nourished, no acute distress.  Sitting in a wheelchair. Eyes: Pink conjunctiva, anicteric sclera. HEENT: Normocephalic, moist mucous membranes. Lungs: No audible wheezing or coughing. Heart: Regular rate and rhythm. Abdomen: Soft, nontender, no obvious distention. Musculoskeletal: No edema, cyanosis, or clubbing. Neuro: Alert, answering all questions appropriately. Cranial nerves grossly intact. Skin: No rashes or petechiae noted. Psych: Normal affect.   LAB RESULTS:  Lab Results  Component Value Date   NA 136 08/22/2020   K 4.0 08/22/2020   CL 102 08/22/2020   CO2 23 08/22/2020   GLUCOSE 120 (H) 08/22/2020   BUN 30 (H) 08/22/2020   CREATININE 1.33 (H) 08/22/2020   CALCIUM 9.5 08/22/2020   PROT 7.1 08/22/2020   ALBUMIN 4.4 08/22/2020   AST 26 08/22/2020   ALT 21 08/22/2020   ALKPHOS 83 08/22/2020   BILITOT 0.5 08/22/2020   GFRNONAA 41 (L) 08/22/2020   GFRAA 30 (L) 09/09/2019    Lab Results  Component Value Date   WBC 7.8 08/22/2020   NEUTROABS 5.1 08/22/2020   HGB 12.2 08/22/2020   HCT 37.2 08/22/2020   MCV 95.1 08/22/2020   PLT 235 08/22/2020   Lab Results  Component Value Date   IRON 85 05/23/2020   TIBC 421 05/23/2020   IRONPCTSAT 20 05/23/2020   Lab Results  Component Value Date   FERRITIN 50 05/23/2020     STUDIES: No  results found.  ASSESSMENT: Multiple myeloma, fractured right hip.  PLAN:    1.  Multiple myeloma: Bone marrow biopsy confirmed diagnosis with plasma cells up to 90% of biopsy.  Cytogenetics are reported as normal.  Metastatic bone survey from March 03, 2020 reviewed independently with multiple skeletal lucencies consistent with myeloma. Her M spike was initially 1.2 and initially trended down to 0.1, but as of July 11, 2020 her M spike is trended up to  0.3.  Today's result is pending.  IgA levels initially were 2773, trended down to normal, but now increased to 546. Kappa and lambda free light chains are within normal limits.  Patient's renal insufficiency has improved and she is no longer anemic.  Initial plan was to give Velcade on days 1, 4, 8, and 11 along with 10 mg Revlimid on days 1 through 14. Patient will also receive weekly 20 mg dexamethasone.  Revlimid was discontinued secondary to intolerance.  Patient last received single agent Velcade on June 20, 2020.  She is still recovering from her fall and subdural hematoma, therefore we will continue to hold Velcade.  Return to clinic in 1 month for further evaluation and discussion of reinitiating treatment.  2.  Anemia: Resolved.  Previously, iron stores were within normal limits.  Likely multifactorial with chronic renal insufficiency, underlying myeloma, as well as history of recent GI bleed.  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  Monitor. 3.  Chronic renal insufficiency: Improving.  Patient's creatinine is 1.33.  Velcade does not need to be dosed reduced and renal dysfunction.  Monitor.   4.  Ascites: Patient does not complain of this today.  Continue follow-up with heart failure clinic as scheduled. 5.  Angiodysplastic lesions: Continue follow-up with GI as scheduled. 6. Constipation: Resolved. Continue stool softeners and MiraLAX as Monday. 7.  Right hip fracture: Improving.   Continue rehab and follow-up with orthopedics as scheduled. Continue Percocet as needed. 8. Thrombocytopenia: Resolved. 9.  Pain: Patient was given a refill of her hydrocodone today. 10.  Peripheral neuropathy: Patient reports she takes 100 mg gabapentin twice per day.  She was given a prescription for 300 mg tabs and instructed to take a total of 400 mg at bedtime.  Patient expressed understanding and was in agreement with this plan. She also understands that She can call clinic at any time with any questions, concerns, or complaints.    Lloyd Huger, MD   08/23/2020 9:36 AM

## 2020-08-22 ENCOUNTER — Inpatient Hospital Stay: Payer: Medicare Other | Attending: Oncology

## 2020-08-22 ENCOUNTER — Inpatient Hospital Stay (HOSPITAL_BASED_OUTPATIENT_CLINIC_OR_DEPARTMENT_OTHER): Payer: Medicare Other | Admitting: Oncology

## 2020-08-22 ENCOUNTER — Other Ambulatory Visit: Payer: Self-pay | Admitting: *Deleted

## 2020-08-22 ENCOUNTER — Encounter: Payer: Self-pay | Admitting: Oncology

## 2020-08-22 VITALS — BP 100/68 | HR 84 | Temp 98.2°F | Resp 12 | Wt 146.4 lb

## 2020-08-22 DIAGNOSIS — C9000 Multiple myeloma not having achieved remission: Secondary | ICD-10-CM | POA: Diagnosis present

## 2020-08-22 DIAGNOSIS — N189 Chronic kidney disease, unspecified: Secondary | ICD-10-CM | POA: Insufficient documentation

## 2020-08-22 DIAGNOSIS — E1122 Type 2 diabetes mellitus with diabetic chronic kidney disease: Secondary | ICD-10-CM | POA: Diagnosis not present

## 2020-08-22 DIAGNOSIS — Z79899 Other long term (current) drug therapy: Secondary | ICD-10-CM | POA: Insufficient documentation

## 2020-08-22 DIAGNOSIS — I130 Hypertensive heart and chronic kidney disease with heart failure and stage 1 through stage 4 chronic kidney disease, or unspecified chronic kidney disease: Secondary | ICD-10-CM | POA: Diagnosis not present

## 2020-08-22 LAB — CBC WITH DIFFERENTIAL/PLATELET
Abs Immature Granulocytes: 0.02 10*3/uL (ref 0.00–0.07)
Basophils Absolute: 0.1 10*3/uL (ref 0.0–0.1)
Basophils Relative: 1 %
Eosinophils Absolute: 0.1 10*3/uL (ref 0.0–0.5)
Eosinophils Relative: 1 %
HCT: 37.2 % (ref 36.0–46.0)
Hemoglobin: 12.2 g/dL (ref 12.0–15.0)
Immature Granulocytes: 0 %
Lymphocytes Relative: 23 %
Lymphs Abs: 1.8 10*3/uL (ref 0.7–4.0)
MCH: 31.2 pg (ref 26.0–34.0)
MCHC: 32.8 g/dL (ref 30.0–36.0)
MCV: 95.1 fL (ref 80.0–100.0)
Monocytes Absolute: 0.7 10*3/uL (ref 0.1–1.0)
Monocytes Relative: 9 %
Neutro Abs: 5.1 10*3/uL (ref 1.7–7.7)
Neutrophils Relative %: 66 %
Platelets: 235 10*3/uL (ref 150–400)
RBC: 3.91 MIL/uL (ref 3.87–5.11)
RDW: 15.3 % (ref 11.5–15.5)
WBC: 7.8 10*3/uL (ref 4.0–10.5)
nRBC: 0 % (ref 0.0–0.2)

## 2020-08-22 LAB — COMPREHENSIVE METABOLIC PANEL
ALT: 21 U/L (ref 0–44)
AST: 26 U/L (ref 15–41)
Albumin: 4.4 g/dL (ref 3.5–5.0)
Alkaline Phosphatase: 83 U/L (ref 38–126)
Anion gap: 11 (ref 5–15)
BUN: 30 mg/dL — ABNORMAL HIGH (ref 8–23)
CO2: 23 mmol/L (ref 22–32)
Calcium: 9.5 mg/dL (ref 8.9–10.3)
Chloride: 102 mmol/L (ref 98–111)
Creatinine, Ser: 1.33 mg/dL — ABNORMAL HIGH (ref 0.44–1.00)
GFR, Estimated: 41 mL/min — ABNORMAL LOW (ref >60)
Glucose, Bld: 120 mg/dL — ABNORMAL HIGH (ref 70–99)
Potassium: 4 mmol/L (ref 3.5–5.1)
Sodium: 136 mmol/L (ref 135–145)
Total Bilirubin: 0.5 mg/dL (ref 0.3–1.2)
Total Protein: 7.1 g/dL (ref 6.5–8.1)

## 2020-08-22 MED ORDER — GABAPENTIN 300 MG PO CAPS: 300.0000 mg | ORAL_CAPSULE | Freq: Every day | ORAL | 2 refills | Status: DC

## 2020-08-22 MED ORDER — HYDROCODONE-ACETAMINOPHEN 5-325 MG PO TABS: ORAL_TABLET | ORAL | 0 refills | Status: DC

## 2020-08-22 NOTE — Progress Notes (Signed)
Patient here for follow up. 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

## 2020-08-23 LAB — KAPPA/LAMBDA LIGHT CHAINS
Kappa free light chain: 5.3 mg/L (ref 3.3–19.4)
Kappa, lambda light chain ratio: 0.8 (ref 0.26–1.65)
Lambda free light chains: 6.6 mg/L (ref 5.7–26.3)

## 2020-08-23 LAB — IGG, IGA, IGM
IgA: 546 mg/dL — ABNORMAL HIGH (ref 64–422)
IgG (Immunoglobin G), Serum: 177 mg/dL — ABNORMAL LOW (ref 586–1602)
IgM (Immunoglobulin M), Srm: 12 mg/dL — ABNORMAL LOW (ref 26–217)

## 2020-08-24 LAB — PROTEIN ELECTROPHORESIS, SERUM
A/G Ratio: 1.5 (ref 0.7–1.7)
Albumin ELP: 3.8 g/dL (ref 2.9–4.4)
Alpha-1-Globulin: 0.2 g/dL (ref 0.0–0.4)
Alpha-2-Globulin: 0.9 g/dL (ref 0.4–1.0)
Beta Globulin: 1.1 g/dL (ref 0.7–1.3)
Gamma Globulin: 0.3 g/dL — ABNORMAL LOW (ref 0.4–1.8)
Globulin, Total: 2.6 g/dL (ref 2.2–3.9)
M-Spike, %: 0.5 g/dL — ABNORMAL HIGH
Total Protein ELP: 6.4 g/dL (ref 6.0–8.5)

## 2020-08-31 DIAGNOSIS — Z09 Encounter for follow-up examination after completed treatment for conditions other than malignant neoplasm: Secondary | ICD-10-CM | POA: Insufficient documentation

## 2020-09-15 NOTE — Progress Notes (Signed)
[place]  [phone] Fax:(336) [phone]  ID: Heather Peterson OB: [birth year]  MR#: [id number]  UUE#:[id number]  [name], MD as PCP - General (Family Medicine) Lloyd Huger, MD as Consulting Physician (Oncology)  CHIEF COMPLAINT: Multiple myeloma, subdural hematoma.  INTERVAL HISTORY: Patient returns to clinic today for further evaluation and discussion on whether or not to reinitiate treatment.  She continues to have significant weakness and fatigue.  She also has a peripheral neuropathy that is painful making physical therapy difficult as well as difficulty sleeping.  She also complains of sciatica.  She has no other neurologic complaints.  She has chronic shortness of breath and requires oxygen 24 hours/day.  She denies any fevers.  She has a fair appetite.  She denies any chest pain, cough, or hemoptysis. She denies any nausea, vomiting, constipation, or diarrhea. She has no melena or hematochezia.  She has no urinary complaints.  Patient offers no further specific complaints today.  REVIEW OF SYSTEMS:   Review of Systems  Constitutional: Positive for malaise/fatigue. Negative for fever.  Respiratory: Positive for shortness of breath. Negative for cough and hemoptysis.   Cardiovascular: Negative.  Negative for chest pain and leg swelling.  Gastrointestinal: Negative.  Negative for abdominal pain, diarrhea, melena and nausea.  Genitourinary: Negative.  Negative for dysuria and hematuria.  Musculoskeletal: Positive for back pain. Negative for joint pain.  Skin: Negative.  Negative for rash.  Neurological: Positive for sensory change and weakness. Negative for dizziness, focal weakness and headaches.  Psychiatric/Behavioral: Negative.  The patient is not nervous/anxious.     As per HPI. Otherwise, a complete review of systems is negative.  PAST MEDICAL HISTORY: Past Medical History:  Diagnosis Date  . Anxiety    . Chronic combined systolic and diastolic CHF (congestive heart failure) ([place])   . Chronic kidney disease   . Coronary artery disease   . Depression   . Diabetes mellitus without complication ([place])   . Diabetes mellitus, type II ([place])   . Hypertension   . MI (myocardial infarction) ([place])    x 5  . Pacemaker   . 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[place])   . Prolonged Q-T interval on ECG   . Thyroid disease     PAST SURGICAL HISTORY: Past Surgical History:  Procedure Laterality Date  . CENTRAL LINE INSERTION  03/11/2017   Procedure: CENTRAL LINE INSERTION;  Surgeon: Leonie Man, MD;  Location: [facility];  Service: Cardiovascular;;  . CHOLECYSTECTOMY    . COLONOSCOPY WITH PROPOFOL N/A 09/01/2019   Procedure: COLONOSCOPY WITH PROPOFOL;  Surgeon: Toledo, Benay Pike, MD;  Location: [facility];  Service: Gastroenterology;  Laterality: N/A;  . CORONARY STENT INTERVENTION W/IMPELLA N/A 03/11/2017   Procedure: Coronary Stent Intervention w/Impella;  Surgeon: Leonie Man, MD;  Location: [facility];  Service: Cardiovascular;  Laterality: N/A;  . ESOPHAGOGASTRODUODENOSCOPY (EGD) WITH PROPOFOL N/A 09/01/2019   Procedure: ESOPHAGOGASTRODUODENOSCOPY (EGD) WITH PROPOFOL;  Surgeon: Toledo, Benay Pike, MD;  Location: [facility];  Service: Gastroenterology;  Laterality: N/A;  . ESOPHAGOGASTRODUODENOSCOPY (EGD) WITH PROPOFOL N/A 09/08/2019   Procedure: ESOPHAGOGASTRODUODENOSCOPY (EGD) WITH PROPOFOL;  Surgeon: [name], MD;  Location: [facility];  Service: Gastroenterology;  Laterality: N/A;  . EYE SURGERY    . HIP ARTHROPLASTY Right 03/29/2020   Procedure: ARTHROPLASTY BIPOLAR HIP (HEMIARTHROPLASTY);  Surgeon: Corky Mull, MD;  Location: [facility];  Service: Orthopedics;  Laterality: Right;  . INTRAVASCULAR PRESSURE WIRE/FFR STUDY  N/A 03/11/2017   Procedure: INTRAVASCULAR PRESSURE WIRE/FFR STUDY;  Surgeon: Leonie Man, MD;  Location: [facility];  Service:  Cardiovascular;  Laterality: N/A;  . INTRAVASCULAR ULTRASOUND/IVUS N/A 03/11/2017   Procedure: Intravascular Ultrasound/IVUS;  Surgeon: Leonie Man, MD;  Location: [facility];  Service: Cardiovascular;  Laterality: N/A;  . LEFT HEART CATH AND CORONARY ANGIOGRAPHY N/A 03/05/2017   Procedure: LEFT HEART CATH AND CORONARY ANGIOGRAPHY;  Surgeon: Isaias Cowman, MD;  Location: [facility] [place] [facility];  Service: Cardiovascular;  Laterality: N/A;  . PACEMAKER IMPLANT    . pacemaker/defibrillator Left     FAMILY HISTORY: Family History  Problem Relation Age of Onset  . Hypertension Father   . Heart attack Father   . Depression Sister   . Depression Brother   . Depression Brother     ADVANCED DIRECTIVES (Y/N):  N  HEALTH MAINTENANCE: Social History   Tobacco Use  . Smoking status: Current Every Day Smoker    Packs/day: 0.25    Types: E-cigarettes, Cigarettes  . Smokeless tobacco: Never Used  Vaping Use  . Vaping Use: Former  Substance Use Topics  . Alcohol use: Not Currently    Comment: occasionally  . Drug use: Yes    Comment: prescribed pain meds     Colonoscopy:  PAP:  Bone density:  Lipid panel:  Allergies  Allergen Reactions  . Celebrex [Celecoxib] Anaphylaxis  . Glipizide Anaphylaxis  . Levaquin [Levofloxacin In D5w] Other (See Comments)    Heart arrhthymias  . Levofloxacin Other (See Comments) and Palpitations    ICD fired  . Lisinopril Swelling    Lip and facial swelling  . Sulfa Antibiotics Other (See Comments) and Anaphylaxis    Reaction: unknown  . Metformin Other (See Comments)    Gi tolerance   . Penicillins Rash and Other (See Comments)    Has patient had a PCN reaction causing immediate rash, facial/tongue/throat swelling, SOB or lightheadedness with hypotension: Unknown Has patient had a PCN reaction causing severe rash involving mucus membranes or skin necrosis: No Has patient had a PCN reaction that required hospitalization: No Has  patient had a PCN reaction occurring within the last 10 years: No If all of the above answers are "NO", then may proceed with Cephalosporin use.     Current Outpatient Medications  Medication Sig Dispense Refill  . allopurinol (ZYLOPRIM) 100 MG tablet Take 50 mg by mouth daily.    Marland Kitchen aspirin EC 81 MG tablet Take 81 mg by mouth at bedtime.     . docusate sodium (COLACE) 100 MG capsule Take 100 mg by mouth 2 (two) times daily.    . Ensure Max Protein (ENSURE MAX PROTEIN) LIQD Take 330 mLs (11 oz total) by mouth 2 (two) times daily between meals.    . fexofenadine (ALLEGRA) 180 MG tablet Take 180 mg by mouth daily.    Marland Kitchen FLUoxetine (PROZAC) 10 MG capsule Take 10 mg by mouth daily.    Marland Kitchen gabapentin (NEURONTIN) 300 MG capsule Take 1 capsule (300 mg total) by mouth at bedtime. 30 capsule 2  . HYDROcodone-acetaminophen (NORCO/VICODIN) 5-325 MG tablet SMARTSIG:1 Tablet(s) By Mouth Every 12 Hours PRN 60 tablet 0  . hydrocortisone (ANUSOL-HC) 2.5 % rectal cream Apply 1 application topically 2 (two) times daily.    . Lactulose 20 GM/30ML SOLN Take 30 mLs (20 g total) by mouth in the morning and at bedtime. 450 mL 1  . levothyroxine (SYNTHROID) 75 MCG tablet Take 1 tablet (  75 mcg total) by mouth daily before breakfast. On Monday, Wednesday and Friday    . magnesium oxide (MAG-OX) 400 MG tablet Take 200 mg by mouth daily.    Marland Kitchen mexiletine (MEXITIL) 200 MG capsule Take 1 capsule (200 mg total) by mouth every 12 (twelve) hours. 60 capsule 1  . midodrine (PROAMATINE) 10 MG tablet Take 1 tablet (10 mg total) by mouth 2 (two) times daily with a meal.    . ondansetron (ZOFRAN) 8 MG tablet Take by mouth every 8 (eight) hours as needed for nausea or vomiting.    . OXYGEN Inhale 2 L into the lungs continuous.    . pantoprazole (PROTONIX) 40 MG tablet Take 40 mg by mouth daily.     . polyethylene glycol (MIRALAX) 17 g packet Take 17 g by mouth daily. 14 each 0  . Prenatal Vit-Fe Fumarate-FA (PRENATAL MULTIVITAMIN)  TABS tablet Take 1 tablet by mouth daily.    . simvastatin (ZOCOR) 40 MG tablet Take 20 mg by mouth at bedtime.   2  . traZODone (DESYREL) 50 MG tablet Take 50 mg by mouth at bedtime.     No current facility-administered medications for this visit.    OBJECTIVE: Vitals:   09/21/20 1501  BP: 114/68  Pulse: 73  Temp: 97.7 F (36.5 C)  SpO2: 99%     Body mass index is 26 kg/m.    ECOG FS:2 - Symptomatic, <50% confined to bed  General: Well-developed, well-nourished, no acute distress.  Sitting in a wheelchair. Eyes: Pink conjunctiva, anicteric sclera. HEENT: Normocephalic, moist mucous membranes. Lungs: No audible wheezing or coughing. Heart: Regular rate and rhythm. Abdomen: Soft, nontender, no obvious distention. Musculoskeletal: No edema, cyanosis, or clubbing. Neuro: Alert, answering all questions appropriately. Cranial nerves grossly intact. Skin: No rashes or petechiae noted. Psych: Normal affect.   LAB RESULTS:  Lab Results  Component Value Date   NA 137 09/21/2020   K 4.2 09/21/2020   CL 99 09/21/2020   CO2 27 09/21/2020   GLUCOSE 109 (H) 09/21/2020   BUN 28 (H) 09/21/2020   CREATININE 1.12 (H) 09/21/2020   CALCIUM 9.5 09/21/2020   PROT 7.4 09/21/2020   ALBUMIN 4.2 09/21/2020   AST 26 09/21/2020   ALT 23 09/21/2020   ALKPHOS 122 09/21/2020   BILITOT 0.6 09/21/2020   GFRNONAA 50 (L) 09/21/2020   GFRAA 30 (L) 09/09/2019    Lab Results  Component Value Date   WBC 8.0 09/21/2020   NEUTROABS 4.9 09/21/2020   HGB 11.7 (L) 09/21/2020   HCT 35.6 (L) 09/21/2020   MCV 94.9 09/21/2020   PLT 226 09/21/2020   Lab Results  Component Value Date   IRON 85 05/23/2020   TIBC 421 05/23/2020   IRONPCTSAT 20 05/23/2020   Lab Results  Component Value Date   FERRITIN 50 05/23/2020     STUDIES: No results found.  ASSESSMENT: Multiple myeloma, fractured right hip.  PLAN:    1.  Multiple myeloma: Bone marrow biopsy confirmed diagnosis with plasma cells up to  90% of biopsy.  Cytogenetics are reported as normal.  Metastatic bone survey from March 03, 2020 reviewed independently with multiple skeletal lucencies consistent with myeloma. Her M spike was initially 1.2 and initially trended down to 0.1, but more recently has trended up to 0.5.  Today's result is pending.  IgA levels initially were 2773, trended down to normal, but now increased to 546. Kappa and lambda free light chains are within normal limits.  Patient's renal  insufficiency has resolved and she only has a mild anemia.  Initial plan was to give Velcade on days 1, 4, 8, and 11 along with 10 mg Revlimid on days 1 through 14. Patient will also receive weekly 20 mg dexamethasone.  Revlimid was discontinued secondary to intolerance.  Patient last received single agent Velcade on June 20, 2020.  Given her decreased performance status, will continue treatment.  Return to clinic in 1 month for further evaluation and discussion of reinitiating treatment.   2.  Anemia: Essentially resolved.  Previously, iron stores were within normal limits.  Likely multifactorial with chronic renal insufficiency, underlying myeloma, as well as history of recent GI bleed.  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  Monitor. 3.  Chronic renal insufficiency: Resolved. 4.  Ascites: Patient does not complain of this today.  Continue follow-up with heart failure clinic as scheduled. 5.  Angiodysplastic lesions: Continue follow-up with GI as scheduled. 6. Constipation: Resolved. Continue stool softeners and MiraLAX as Monday. 7.  Right hip fracture: Continue rehab and follow-up with orthopedics as scheduled. Continue Percocet as needed. 8. Thrombocytopenia: Resolved. 9.  Peripheral neuropathy: Patient now takes 100 mg gabapentin twice a day during the day and 600 mg gabapentin at night.  Have instructed to increase her nightly dose to 800 mg.  Patient was also given a  referral to neuro oncology today.    Patient expressed understanding and was in agreement with this plan. She also understands that She can call clinic at any time with any questions, concerns, or complaints.    Lloyd Huger, MD   09/21/2020 7:07 PM

## 2020-09-21 ENCOUNTER — Other Ambulatory Visit: Payer: Self-pay

## 2020-09-21 ENCOUNTER — Inpatient Hospital Stay: Payer: Medicare Other | Attending: Oncology

## 2020-09-21 ENCOUNTER — Encounter: Payer: Self-pay | Admitting: Oncology

## 2020-09-21 ENCOUNTER — Inpatient Hospital Stay (HOSPITAL_BASED_OUTPATIENT_CLINIC_OR_DEPARTMENT_OTHER): Payer: Medicare Other | Admitting: Oncology

## 2020-09-21 VITALS — BP 114/68 | HR 73 | Temp 97.7°F | Ht 64.0 in | Wt 151.5 lb

## 2020-09-21 DIAGNOSIS — I130 Hypertensive heart and chronic kidney disease with heart failure and stage 1 through stage 4 chronic kidney disease, or unspecified chronic kidney disease: Secondary | ICD-10-CM | POA: Insufficient documentation

## 2020-09-21 DIAGNOSIS — F17210 Nicotine dependence, cigarettes, uncomplicated: Secondary | ICD-10-CM | POA: Diagnosis not present

## 2020-09-21 DIAGNOSIS — N189 Chronic kidney disease, unspecified: Secondary | ICD-10-CM | POA: Insufficient documentation

## 2020-09-21 DIAGNOSIS — M25571 Pain in right ankle and joints of right foot: Secondary | ICD-10-CM | POA: Insufficient documentation

## 2020-09-21 DIAGNOSIS — I252 Old myocardial infarction: Secondary | ICD-10-CM | POA: Diagnosis not present

## 2020-09-21 DIAGNOSIS — E1122 Type 2 diabetes mellitus with diabetic chronic kidney disease: Secondary | ICD-10-CM | POA: Diagnosis not present

## 2020-09-21 DIAGNOSIS — T451X5S Adverse effect of antineoplastic and immunosuppressive drugs, sequela: Secondary | ICD-10-CM | POA: Insufficient documentation

## 2020-09-21 DIAGNOSIS — I2510 Atherosclerotic heart disease of native coronary artery without angina pectoris: Secondary | ICD-10-CM | POA: Diagnosis not present

## 2020-09-21 DIAGNOSIS — F419 Anxiety disorder, unspecified: Secondary | ICD-10-CM | POA: Diagnosis not present

## 2020-09-21 DIAGNOSIS — Z79899 Other long term (current) drug therapy: Secondary | ICD-10-CM | POA: Diagnosis not present

## 2020-09-21 DIAGNOSIS — C9000 Multiple myeloma not having achieved remission: Secondary | ICD-10-CM | POA: Diagnosis not present

## 2020-09-21 DIAGNOSIS — G620 Drug-induced polyneuropathy: Secondary | ICD-10-CM | POA: Diagnosis not present

## 2020-09-21 LAB — COMPREHENSIVE METABOLIC PANEL
ALT: 23 U/L (ref 0–44)
AST: 26 U/L (ref 15–41)
Albumin: 4.2 g/dL (ref 3.5–5.0)
Alkaline Phosphatase: 122 U/L (ref 38–126)
Anion gap: 11 (ref 5–15)
BUN: 28 mg/dL — ABNORMAL HIGH (ref 8–23)
CO2: 27 mmol/L (ref 22–32)
Calcium: 9.5 mg/dL (ref 8.9–10.3)
Chloride: 99 mmol/L (ref 98–111)
Creatinine, Ser: 1.12 mg/dL — ABNORMAL HIGH (ref 0.44–1.00)
GFR, Estimated: 50 mL/min — ABNORMAL LOW (ref >60)
Glucose, Bld: 109 mg/dL — ABNORMAL HIGH (ref 70–99)
Potassium: 4.2 mmol/L (ref 3.5–5.1)
Sodium: 137 mmol/L (ref 135–145)
Total Bilirubin: 0.6 mg/dL (ref 0.3–1.2)
Total Protein: 7.4 g/dL (ref 6.5–8.1)

## 2020-09-21 LAB — CBC WITH DIFFERENTIAL/PLATELET
Abs Immature Granulocytes: 0.05 10*3/uL (ref 0.00–0.07)
Basophils Absolute: 0 10*3/uL (ref 0.0–0.1)
Basophils Relative: 1 %
Eosinophils Absolute: 0.1 10*3/uL (ref 0.0–0.5)
Eosinophils Relative: 2 %
HCT: 35.6 % — ABNORMAL LOW (ref 36.0–46.0)
Hemoglobin: 11.7 g/dL — ABNORMAL LOW (ref 12.0–15.0)
Immature Granulocytes: 1 %
Lymphocytes Relative: 25 %
Lymphs Abs: 2 10*3/uL (ref 0.7–4.0)
MCH: 31.2 pg (ref 26.0–34.0)
MCHC: 32.9 g/dL (ref 30.0–36.0)
MCV: 94.9 fL (ref 80.0–100.0)
Monocytes Absolute: 0.8 10*3/uL (ref 0.1–1.0)
Monocytes Relative: 10 %
Neutro Abs: 4.9 10*3/uL (ref 1.7–7.7)
Neutrophils Relative %: 61 %
Platelets: 226 10*3/uL (ref 150–400)
RBC: 3.75 MIL/uL — ABNORMAL LOW (ref 3.87–5.11)
RDW: 15.2 % (ref 11.5–15.5)
WBC: 8 10*3/uL (ref 4.0–10.5)
nRBC: 0 % (ref 0.0–0.2)

## 2020-09-21 NOTE — Progress Notes (Signed)
Pt sts is in pain on a scale of 6 today she says her feet , legs,hip and back is hurting her meds are not helping . The patient is not sleeping well and wakes up to pain she said her gabapentin was helping but is not working as much .  Her o2 at night says has not been enough and is on 2 liter

## 2020-09-22 LAB — IGG, IGA, IGM
IgA: 978 mg/dL — ABNORMAL HIGH (ref 64–422)
IgG (Immunoglobin G), Serum: 185 mg/dL — ABNORMAL LOW (ref 586–1602)
IgM (Immunoglobulin M), Srm: 8 mg/dL — ABNORMAL LOW (ref 26–217)

## 2020-09-22 LAB — KAPPA/LAMBDA LIGHT CHAINS
Kappa free light chain: 5.9 mg/L (ref 3.3–19.4)
Kappa, lambda light chain ratio: 0.72 (ref 0.26–1.65)
Lambda free light chains: 8.2 mg/L (ref 5.7–26.3)

## 2020-09-25 LAB — PROTEIN ELECTROPHORESIS, SERUM
A/G Ratio: 1.3 (ref 0.7–1.7)
Albumin ELP: 3.9 g/dL (ref 2.9–4.4)
Alpha-1-Globulin: 0.2 g/dL (ref 0.0–0.4)
Alpha-2-Globulin: 0.9 g/dL (ref 0.4–1.0)
Beta Globulin: 1.4 g/dL — ABNORMAL HIGH (ref 0.7–1.3)
Gamma Globulin: 0.4 g/dL (ref 0.4–1.8)
Globulin, Total: 3 g/dL (ref 2.2–3.9)
M-Spike, %: 0.6 g/dL — ABNORMAL HIGH
Total Protein ELP: 6.9 g/dL (ref 6.0–8.5)

## 2020-10-01 ENCOUNTER — Other Ambulatory Visit: Payer: Self-pay | Admitting: Oncology

## 2020-10-02 ENCOUNTER — Other Ambulatory Visit: Payer: Self-pay

## 2020-10-02 ENCOUNTER — Encounter: Payer: Self-pay | Admitting: Oncology

## 2020-10-03 ENCOUNTER — Telehealth: Payer: Self-pay | Admitting: *Deleted

## 2020-10-03 NOTE — Telephone Encounter (Signed)
Called stating that she needs Gabapentin refill and that pharmacy will not fill because per directions they have, it is not time. She said she is using 100 mg caps three times a day. I do see that a prescription for 300 mg every night was sent yesterday, but per last office note "9.  Peripheral neuropathy: Patient now takes 100 mg gabapentin twice a day during the day and 600 mg gabapentin at night.  Have instructed to increase her nightly dose to 800 mg.  Patient was also given a referral to neuro oncology today."  Please advise and sent prescription that is appropriate. She is requesting a 90 day supply

## 2020-10-04 ENCOUNTER — Other Ambulatory Visit: Payer: Self-pay | Admitting: *Deleted

## 2020-10-04 ENCOUNTER — Encounter: Payer: Self-pay | Admitting: Oncology

## 2020-10-04 MED ORDER — GABAPENTIN 100 MG PO CAPS: 100.0000 mg | ORAL_CAPSULE | Freq: Three times a day (TID) | ORAL | 0 refills | Status: DC

## 2020-10-04 NOTE — Telephone Encounter (Signed)
Called patient to clarify dosing, she takes 100 mg AM, 100 mg in the afternoon and total of 700 mg at bedtime. She has RX for 600 mg capsules, 90 day supply of 100 mg 3 times a day has been sent to patients pharmacy.

## 2020-10-06 ENCOUNTER — Encounter: Payer: Self-pay | Admitting: Internal Medicine

## 2020-10-06 ENCOUNTER — Inpatient Hospital Stay (HOSPITAL_BASED_OUTPATIENT_CLINIC_OR_DEPARTMENT_OTHER): Payer: Medicare Other | Admitting: Internal Medicine

## 2020-10-06 ENCOUNTER — Ambulatory Visit
Admission: RE | Admit: 2020-10-06 | Discharge: 2020-10-06 | Disposition: A | Discharge status: Home / Self Care | Payer: Medicare Other | Source: Ambulatory Visit | Attending: Oncology | Admitting: Oncology

## 2020-10-06 ENCOUNTER — Ambulatory Visit
Admission: RE | Admit: 2020-10-06 | Discharge: 2020-10-06 | Disposition: A | Discharge status: Home / Self Care | Payer: Medicare Other | Source: Home / Self Care | Attending: Internal Medicine | Admitting: Internal Medicine

## 2020-10-06 VITALS — BP 120/73 | HR 78 | Temp 97.1°F | Resp 20 | Wt 152.4 lb

## 2020-10-06 DIAGNOSIS — M79671 Pain in right foot: Secondary | ICD-10-CM

## 2020-10-06 DIAGNOSIS — C9000 Multiple myeloma not having achieved remission: Secondary | ICD-10-CM

## 2020-10-06 DIAGNOSIS — G620 Drug-induced polyneuropathy: Secondary | ICD-10-CM

## 2020-10-06 DIAGNOSIS — T451X5A Adverse effect of antineoplastic and immunosuppressive drugs, initial encounter: Secondary | ICD-10-CM

## 2020-10-06 MED ORDER — GABAPENTIN 300 MG PO CAPS: ORAL_CAPSULE | ORAL | 3 refills | Status: DC

## 2020-10-06 MED ORDER — PREDNISONE 20 MG PO TABS
40.0000 mg | ORAL_TABLET | Freq: Every day | ORAL | Status: DC
Start: 2020-10-07 — End: 2020-10-06

## 2020-10-06 NOTE — Progress Notes (Signed)
Patient here today for initial evaluation regarding neuropathy. Patient reports pain primarily in right foot, does also have intermittent pain in right shoulder and arm.

## 2020-10-06 NOTE — Progress Notes (Signed)
[facility] [place], [place] [address] [phone]   New Patient Evaluation  Date of Service: 10/06/20 Patient Name: Heather Peterson Patient MRN: [id number] Patient DOB: 12-25-1940 Provider: Ventura Sellers, MD  Identifying Statement:  Heather Peterson is a 80 y.o. female with Chemotherapy-induced peripheral neuropathy [facility]) who presents for initial consultation and evaluation regarding cancer associated neurologic deficits.    Referring Provider: [name], MD [address] [place],  [place] [id number]  Primary Cancer:  Oncologic History: Oncology History  Multiple myeloma ([place])  03/01/2020 Initial Diagnosis   Multiple myeloma ([place])   03/13/2020 -  Chemotherapy    Patient is on Treatment Plan: MYELOMA NON-TRANSPLANT CANDIDATES VRD SQ Q21D         History of Present Illness: The patient's records from the referring physician were obtained and reviewed and the patient interviewed to confirm this HPI.  Heather Peterson presents to medical attention to review pain complaints.  She describes 3 weeks history of pain, redness, and swelling on the top of her left foot.  It is not present constantly, but is exacerbated by bearing weight.  She also describes what she describes as "neuropathy", which is described as numbness and some tingling in the bottoms of her feet, since starting velcade treatments in November.  In addition, she experienced a fall in February and traumatic sub-arachnoid hemorrhage.  Complains additionally of pain in her lower back, hip in addition to the foot.  Doses gabapentin 179m morning and mid-day, then 7058mat night.   Medications: Current Outpatient Medications on File Prior to Visit  Medication Sig Dispense Refill  . allopurinol (ZYLOPRIM) 100 MG tablet Take 50 mg by mouth daily.    . Marland Kitchenspirin EC 81 MG tablet Take 81 mg by mouth at bedtime.     . docusate sodium (COLACE) 100 MG capsule  Take 100 mg by mouth 2 (two) times daily.    . Ensure Max Protein (ENSURE MAX PROTEIN) LIQD Take 330 mLs (11 oz total) by mouth 2 (two) times daily between meals.    . fexofenadine (ALLEGRA) 180 MG tablet Take 180 mg by mouth daily.    . Marland KitchenLUoxetine (PROZAC) 10 MG capsule Take 10 mg by mouth daily.    . Marland Kitchenabapentin (NEURONTIN) 100 MG capsule Take 1 capsule (100 mg total) by mouth 3 (three) times daily. 270 capsule 0  . HYDROcodone-acetaminophen (NORCO/VICODIN) 5-325 MG tablet SMARTSIG:1 Tablet(s) By Mouth Every 12 Hours PRN 60 tablet 0  . hydrocortisone (ANUSOL-HC) 2.5 % rectal cream Apply 1 application topically 2 (two) times daily.    . Lactulose 20 GM/30ML SOLN Take 30 mLs (20 g total) by mouth in the morning and at bedtime. 450 mL 1  . levothyroxine (SYNTHROID) 75 MCG tablet Take 1 tablet (75 mcg total) by mouth daily before breakfast. On Monday, Wednesday and Friday    . magnesium oxide (MAG-OX) 400 MG tablet Take 200 mg by mouth daily.    . Marland Kitchenexiletine (MEXITIL) 200 MG capsule Take 1 capsule (200 mg total) by mouth every 12 (twelve) hours. 60 capsule 1  . midodrine (PROAMATINE) 10 MG tablet Take 1 tablet (10 mg total) by mouth 2 (two) times daily with a meal.    . ondansetron (ZOFRAN) 8 MG tablet Take by mouth every 8 (eight) hours as needed for nausea or vomiting.    . OXYGEN Inhale 2 L into the lungs continuous.    . pantoprazole (  PROTONIX) 40 MG tablet Take 40 mg by mouth daily.     . polyethylene glycol (MIRALAX) 17 g packet Take 17 g by mouth daily. 14 each 0  . Prenatal Vit-Fe Fumarate-FA (PRENATAL MULTIVITAMIN) TABS tablet Take 1 tablet by mouth daily.    . simvastatin (ZOCOR) 40 MG tablet Take 20 mg by mouth at bedtime.   2  . traZODone (DESYREL) 50 MG tablet Take 50 mg by mouth at bedtime.     No current facility-administered medications on file prior to visit.    Allergies:  Allergies  Allergen Reactions  . Celebrex [Celecoxib] Anaphylaxis  . Glipizide Anaphylaxis  . Levaquin  [Levofloxacin In D5w] Other (See Comments)    Heart arrhthymias  . Levofloxacin Other (See Comments) and Palpitations    ICD fired  . Lisinopril Swelling    Lip and facial swelling  . Sulfa Antibiotics Other (See Comments) and Anaphylaxis    Reaction: unknown  . Metformin Other (See Comments)    Gi tolerance   . Penicillins Rash and Other (See Comments)    Has patient had a PCN reaction causing immediate rash, facial/tongue/throat swelling, SOB or lightheadedness with hypotension: Unknown Has patient had a PCN reaction causing severe rash involving mucus membranes or skin necrosis: No Has patient had a PCN reaction that required hospitalization: No Has patient had a PCN reaction occurring within the last 10 years: No If all of the above answers are "NO", then may proceed with Cephalosporin use.    Past Medical History:  Past Medical History:  Diagnosis Date  . Anxiety   . Chronic combined systolic and diastolic CHF (congestive heart failure) ([place])   . Chronic kidney disease   . Coronary artery disease   . Depression   . Diabetes mellitus without complication ([place])   . Diabetes mellitus, type II ([place])   . Hypertension   . MI (myocardial infarction) ([place])    x 5  . Pacemaker   . 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[place])   . Prolonged Q-T interval on ECG   . Thyroid disease    Past Surgical History:  Past Surgical History:  Procedure Laterality Date  . CENTRAL LINE INSERTION  03/11/2017   Procedure: CENTRAL LINE INSERTION;  Surgeon: Leonie Man, MD;  Location: [facility];  Service: Cardiovascular;;  . CHOLECYSTECTOMY    . COLONOSCOPY WITH PROPOFOL N/A 09/01/2019   Procedure: COLONOSCOPY WITH PROPOFOL;  Surgeon: Toledo, Benay Pike, MD;  Location: [facility] ENDOSCOPY;  Service: Gastroenterology;  Laterality: N/A;  . CORONARY STENT INTERVENTION W/IMPELLA N/A 03/11/2017   Procedure: Coronary Stent Intervention w/Impella;  Surgeon: Leonie Man, MD;  Location: [facility]  [facility];  Service: Cardiovascular;  Laterality: N/A;  . ESOPHAGOGASTRODUODENOSCOPY (EGD) WITH PROPOFOL N/A 09/01/2019   Procedure: ESOPHAGOGASTRODUODENOSCOPY (EGD) WITH PROPOFOL;  Surgeon: Toledo, Benay Pike, MD;  Location: [facility] ENDOSCOPY;  Service: Gastroenterology;  Laterality: N/A;  . ESOPHAGOGASTRODUODENOSCOPY (EGD) WITH PROPOFOL N/A 09/08/2019   Procedure: ESOPHAGOGASTRODUODENOSCOPY (EGD) WITH PROPOFOL;  Surgeon: [name], MD;  Location[facility] ENDOSCOPY;  Service: Gastroenterology;  Laterality: N/A;  . EYE SURGERY    . HIP ARTHROPLASTY Right 03/29/2020   Procedure: ARTHROPLASTY BIPOLAR HIP (HEMIARTHROPLASTY);  Surgeon: Corky Mull, MD;  Location: [facility] ORS;  Service: Orthopedics;  Laterality: Right;  . INTRAVASCULAR PRESSURE WIRE/FFR STUDY N/A 03/11/2017   Procedure: INTRAVASCULAR PRESSURE WIRE/FFR STUDY;  Surgeon: Leonie Man, MD;  Location: [facility];  Service: Cardiovascular;  Laterality: N/A;  . INTRAVASCULAR ULTRASOUND/IVUS  N/A 03/11/2017   Procedure: Intravascular Ultrasound/IVUS;  Surgeon: Leonie Man, MD;  Location: [facility];  Service: Cardiovascular;  Laterality: N/A;  . LEFT HEART CATH AND CORONARY ANGIOGRAPHY N/A 03/05/2017   Procedure: LEFT HEART CATH AND CORONARY ANGIOGRAPHY;  Surgeon: Isaias Cowman, MD;  Location: [facility];  Service: Cardiovascular;  Laterality: N/A;  . PACEMAKER IMPLANT    . pacemaker/defibrillator Left    Social History:  Social History   Socioeconomic History  . Marital status: Married    Spouse name: rodney  . Number of children: 2  . Years of education: Not on file  . Highest education level: High school graduate  Occupational History  . Not on file  Tobacco Use  . Smoking status: Current Every Day Smoker    Packs/day: 0.25    Types: E-cigarettes, Cigarettes  . Smokeless tobacco: Never Used  Vaping Use  . Vaping Use: Former  Substance and Sexual Activity  . Alcohol use: Not Currently    Comment:  occasionally  . Drug use: Yes    Comment: prescribed pain meds  . Sexual activity: Not Currently  Other Topics Concern  . Not on file  Social History Narrative  . Not on file   Social Determinants of Health   Financial Resource Strain: Not on file  Food Insecurity: Not on file  Transportation Needs: Not on file  Physical Activity: Not on file  Stress: Not on file  Social Connections: Not on file  Intimate Partner Violence: Not on file   Family History:  Family History  Problem Relation Age of Onset  . Hypertension Father   . Heart attack Father   . Depression Sister   . Depression Brother   . Depression Brother     Review of Systems: Constitutional: Doesn't report fevers, chills or abnormal weight loss Eyes: Doesn't report blurriness of vision Ears, nose, mouth, throat, and face: Doesn't report sore throat Respiratory: Doesn't report cough, dyspnea or wheezes Cardiovascular: Doesn't report palpitation, chest discomfort  Gastrointestinal:  Doesn't report nausea, constipation, diarrhea GU: Doesn't report incontinence Skin: Doesn't report skin rashes Neurological: Per HPI Musculoskeletal: Doesn't report joint pain Behavioral/Psych: Doesn't report anxiety  Physical Exam: Vitals:   10/06/20 1112 10/06/20 1116  BP:  120/73  Pulse:  78  Resp: 20   Temp: (!) 97.1 F (36.2 C)    KPS: 70. General: Alert, cooperative, pleasant, in no acute distress Head: Normal EENT: No conjunctival injection or scleral icterus.  Lungs: Resp effort normal Cardiac: Regular rate Abdomen: Non-distended abdomen Skin: No rashes cyanosis or petechiae. Extremities: No clubbing or edema  Neurologic Exam: Mental Status: Awake, alert, attentive to examiner. Oriented to self and environment. Language is fluent with intact comprehension.  Cranial Nerves: Visual acuity is grossly normal. Visual fields are full. Extra-ocular movements intact. No ptosis. Face is symmetric Motor: Tone and bulk  are normal. Power is full in both arms and legs. Reflexes are symmetric, no pathologic reflexes present.  Sensory: Stocking sensory loss Gait: Deferred  Labs: I have reviewed the data as listed    Component Value Date/Time   NA 137 09/21/2020 1437   NA 140 03/31/2017 0940   K 4.2 09/21/2020 1437   CL 99 09/21/2020 1437   CO2 27 09/21/2020 1437   GLUCOSE 109 (H) 09/21/2020 1437   BUN 28 (H) 09/21/2020 1437   BUN 31 (H) 03/31/2017 0940   CREATININE 1.12 (H) 09/21/2020 1437   CALCIUM 9.5 09/21/2020 1437   CALCIUM  8.6 (L) 09/07/2019 0951   PROT 7.4 09/21/2020 1437   ALBUMIN 4.2 09/21/2020 1437   AST 26 09/21/2020 1437   ALT 23 09/21/2020 1437   ALKPHOS 122 09/21/2020 1437   BILITOT 0.6 09/21/2020 1437   GFRNONAA 50 (L) 09/21/2020 1437   GFRAA 30 (L) 09/09/2019 0522   Lab Results  Component Value Date   WBC 8.0 09/21/2020   NEUTROABS 4.9 09/21/2020   HGB 11.7 (L) 09/21/2020   HCT 35.6 (L) 09/21/2020   MCV 94.9 09/21/2020   PLT 226 09/21/2020     Assessment/Plan Multiple myeloma not having achieved remission ([place]) - Plan: predniSONE (DELTASONE) tablet 40 mg  Chemotherapy-induced peripheral neuropathy ([facility]) - Plan: predniSONE (DELTASONE) tablet 40 mg  Right foot pain - Plan: DG Foot Complete Right  Heather Peterson presents with clinical syndrome consistent with symmetric, 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  Etiology is exposure to velcade chemotherapy.  This remains mild and minimally intrusive, not progressive in recent months since stopping rx in February.  Her main clinical complaint today is right foot and ankle pain, which is not associated with neuropathic features clinically.  Etiology could be bone/lytic lesion or exacerbation of gout, vs other cause.  We will defer to Dr. Grayland Ormond regarding further workup, need for imaging of the foot if lytic lesion is suspected.  In the short term, we recommended increasing gabapentin to 300/300/900 if  tolerated, as it seems to be helping somewhat with her symptoms.  We also offered a short course of prednisone 10m daily as a trial of ant-inflammatory given history of gout and some overlap with history/exam.  If gabapentin is not as helpful she will be more liberal with utilization of hydrocodone.  We spent twenty additional minutes teaching regarding the natural history, biology, and historical experience in the treatment of neurologic complications of cancer.   We appreciate the opportunity to participate in the care of M[place]   All questions were answered. The patient knows to call the clinic with any problems, questions or concerns. No barriers to learning were detected.  The total time spent in the encounter was 40 minutes and more than 50% was on counseling and review of test results   ZVentura Sellers MD Medical Director of Neuro-Oncology [facility]05/27/22 11:10 AM

## 2020-10-10 ENCOUNTER — Other Ambulatory Visit (HOSPITAL_COMMUNITY): Payer: Self-pay | Admitting: Physical Medicine & Rehabilitation

## 2020-10-10 DIAGNOSIS — G8929 Other chronic pain: Secondary | ICD-10-CM

## 2020-10-13 ENCOUNTER — Encounter: Payer: Self-pay | Admitting: Oncology

## 2020-10-20 NOTE — Progress Notes (Signed)
[facility]  [phone] [phone]  ID: Heather Peterson OB: 02/05/1941  MR#: [id number]  DJM#:[id number]  [name], MD as PCP - General (Family Medicine) Lloyd Huger, MD as Consulting Physician (Oncology)  CHIEF COMPLAINT: Multiple myeloma, subdural hematoma.  INTERVAL HISTORY: Patient returns to clinic today for further evaluation and further discussion on whether or not to reinitiate treatment with single agent Velcade.  She continues to have intermittent pain in her right foot that radiates up her leg through her knee.  She continues have chronic weakness and fatigue. She has no other neurologic complaints.  She has chronic shortness of breath and requires oxygen 24 hours/day.  She denies any fevers.  She has a fair appetite.  She denies any chest pain, cough, or hemoptysis. She denies any nausea, vomiting, constipation, or diarrhea. She has no melena or hematochezia.  She has no urinary complaints.  Patient offers no further specific complaints today.  REVIEW OF SYSTEMS:   Review of Systems  Constitutional:  Positive for malaise/fatigue. Negative for fever.  Respiratory:  Positive for shortness of breath. Negative for cough and hemoptysis.   Cardiovascular: Negative.  Negative for chest pain and leg swelling.  Gastrointestinal: Negative.  Negative for abdominal pain, diarrhea, melena and nausea.  Genitourinary: Negative.  Negative for dysuria and hematuria.  Musculoskeletal:  Positive for back pain. Negative for joint pain.  Skin: Negative.  Negative for rash.  Neurological:  Positive for sensory change and weakness. Negative for dizziness, focal weakness and headaches.  Psychiatric/Behavioral: Negative.  The patient is not nervous/anxious.    As per HPI. Otherwise, a complete review of systems is negative.  PAST MEDICAL HISTORY: Past Medical History:  Diagnosis Date   Anxiety    Chronic combined  systolic and diastolic CHF (congestive heart failure) (HCC)    Chronic kidney disease    Coronary artery disease    Depression    Diabetes mellitus without complication (HCC)    Diabetes mellitus, type II ([place])    Hypertension    MI (myocardial infarction) ([place])    x 5   Pacemaker    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[place])    Prolonged Q-T interval on ECG    Thyroid disease     PAST SURGICAL HISTORY: Past Surgical History:  Procedure Laterality Date   CENTRAL LINE INSERTION  03/11/2017   Procedure: CENTRAL LINE INSERTION;  Surgeon: Leonie Man, MD;  Location: [place] [facility];  Service: Cardiovascular;;   CHOLECYSTECTOMY     COLONOSCOPY WITH PROPOFOL N/A 09/01/2019   Procedure: COLONOSCOPY WITH PROPOFOL;  Surgeon: Toledo, Benay Pike, MD;  Location: [facility];  Service: Gastroenterology;  Laterality: N/A;   CORONARY STENT INTERVENTION W/IMPELLA N/A 03/11/2017   Procedure: Coronary Stent Intervention w/Impella;  Surgeon: Leonie Man, MD;  Location: [place] [facility];  Service: Cardiovascular;  Laterality: N/A;   ESOPHAGOGASTRODUODENOSCOPY (EGD) WITH PROPOFOL N/A 09/01/2019   Procedure: ESOPHAGOGASTRODUODENOSCOPY (EGD) WITH PROPOFOL;  Surgeon: Toledo, Benay Pike, MD;  Location: [facility];  Service: Gastroenterology;  Laterality: N/A;   ESOPHAGOGASTRODUODENOSCOPY (EGD) WITH PROPOFOL N/A 09/08/2019   Procedure: ESOPHAGOGASTRODUODENOSCOPY (EGD) WITH PROPOFOL;  Surgeon: [name], MD;  Location: [facility];  Service: Gastroenterology;  Laterality: N/A;   EYE SURGERY     HIP ARTHROPLASTY Right 03/29/2020   Procedure: ARTHROPLASTY BIPOLAR HIP (HEMIARTHROPLASTY);  Surgeon: Corky Mull, MD;  Location: [facility];  Service: Orthopedics;  Laterality: Right;   INTRAVASCULAR PRESSURE WIRE/FFR STUDY  N/A 03/11/2017   Procedure: INTRAVASCULAR PRESSURE WIRE/FFR STUDY;  Surgeon: Leonie Man, MD;  Location: [place] [facility];  Service: Cardiovascular;  Laterality: N/A;    INTRAVASCULAR ULTRASOUND/IVUS N/A 03/11/2017   Procedure: Intravascular Ultrasound/IVUS;  Surgeon: Leonie Man, MD;  Location: [place] [facility];  Service: Cardiovascular;  Laterality: N/A;   LEFT HEART CATH AND CORONARY ANGIOGRAPHY N/A 03/05/2017   Procedure: LEFT HEART CATH AND CORONARY ANGIOGRAPHY;  Surgeon: Isaias Cowman, MD;  Location: [place] [facility];  Service: Cardiovascular;  Laterality: N/A;   PACEMAKER IMPLANT     pacemaker/defibrillator Left     FAMILY HISTORY: Family History  Problem Relation Age of Onset   Hypertension Father    Heart attack Father    Depression Sister    Depression Brother    Depression Brother     ADVANCED DIRECTIVES (Y/N):  N  HEALTH MAINTENANCE: Social History   Tobacco Use   Smoking status: Every Day    Packs/day: 0.25    Pack years: 0.00    Types: E-cigarettes, Cigarettes   Smokeless tobacco: Never  Vaping Use   Vaping Use: Former  Substance Use Topics   Alcohol use: Not Currently    Comment: occasionally   Drug use: Yes    Comment: prescribed pain meds     Colonoscopy:  PAP:  Bone density:  Lipid panel:  Allergies  Allergen Reactions   Celebrex [Celecoxib] Anaphylaxis   Glipizide Anaphylaxis   Levaquin [Levofloxacin In D5w] Other (See Comments)    Heart arrhthymias   Levofloxacin Other (See Comments) and Palpitations    ICD fired   Lisinopril Swelling    Lip and facial swelling   Sulfa Antibiotics Other (See Comments) and Anaphylaxis    Reaction: unknown   Metformin Other (See Comments)    Gi tolerance    Penicillins Rash and Other (See Comments)    Has patient had a PCN reaction causing immediate rash, facial/tongue/throat swelling, SOB or lightheadedness with hypotension: Unknown Has patient had a PCN reaction causing severe rash involving mucus membranes or skin necrosis: No Has patient had a PCN reaction that required hospitalization: No Has patient had a PCN reaction occurring within the last 10  years: No If all of the above answers are "NO", then may proceed with Cephalosporin use.     Current Outpatient Medications  Medication Sig Dispense Refill   allopurinol (ZYLOPRIM) 100 MG tablet Take 50 mg by mouth daily.     aspirin EC 81 MG tablet Take 81 mg by mouth at bedtime.      docusate sodium (COLACE) 100 MG capsule Take 100 mg by mouth 2 (two) times daily.     fexofenadine (ALLEGRA) 180 MG tablet Take 180 mg by mouth daily.     FLUoxetine (PROZAC) 10 MG capsule Take 10 mg by mouth daily.     gabapentin (NEURONTIN) 300 MG capsule Take 328m in AM, 3051min afternoon, 90011mt night 150 capsule 3   levothyroxine (SYNTHROID) 75 MCG tablet Take 1 tablet (75 mcg total) by mouth daily before breakfast. On Monday, Wednesday and Friday     magnesium oxide (MAG-OX) 400 MG tablet Take 200 mg by mouth daily.     mexiletine (MEXITIL) 200 MG capsule Take 1 capsule (200 mg total) by mouth every 12 (twelve) hours. 60 capsule 1   midodrine (PROAMATINE) 10 MG tablet Take 1 tablet (10 mg total) by mouth 2 (two) times daily with a meal.     ondansetron (ZOFRAN)  8 MG tablet Take by mouth every 8 (eight) hours as needed for nausea or vomiting.     OXYGEN Inhale 2 L into the lungs continuous.     pantoprazole (PROTONIX) 40 MG tablet Take 40 mg by mouth daily.      polyethylene glycol (MIRALAX) 17 g packet Take 17 g by mouth daily. 14 each 0   predniSONE (STERAPRED UNI-PAK 21 TAB) 10 MG (21) TBPK tablet Taper as directed 21 tablet 0   Prenatal Vit-Fe Fumarate-FA (PRENATAL MULTIVITAMIN) TABS tablet Take 1 tablet by mouth daily.     simvastatin (ZOCOR) 40 MG tablet Take 20 mg by mouth at bedtime.   2   traZODone (DESYREL) 50 MG tablet Take 50 mg by mouth at bedtime.     Ensure Max Protein (ENSURE MAX PROTEIN) LIQD Take 330 mLs (11 oz total) by mouth 2 (two) times daily between meals. (Patient not taking: Reported on 10/24/2020)     HYDROcodone-acetaminophen (NORCO/VICODIN) 5-325 MG tablet SMARTSIG:1  Tablet(s) By Mouth Every 12 Hours PRN 60 tablet 0   hydrocortisone (ANUSOL-HC) 2.5 % rectal cream Apply 1 application topically 2 (two) times daily. (Patient not taking: Reported on 10/24/2020)     Lactulose 20 GM/30ML SOLN Take 30 mLs (20 g total) by mouth in the morning and at bedtime. (Patient not taking: Reported on 10/24/2020) 450 mL 1   No current facility-administered medications for this visit.    OBJECTIVE: Vitals:   10/24/20 1054 10/24/20 1057  BP: 115/74   Pulse: 82   Resp: 18   Temp: (!) 97.5 F (36.4 C)   SpO2:  100%     Body mass index is 26.78 kg/m.    ECOG FS:2 - Symptomatic, <50% confined to bed  General: Well-developed, well-nourished, no acute distress. Eyes: Pink conjunctiva, anicteric sclera. HEENT: Normocephalic, moist mucous membranes. Lungs: No audible wheezing or coughing. Heart: Regular rate and rhythm. Abdomen: Soft, nontender, no obvious distention. Musculoskeletal: No edema, cyanosis, or clubbing. Neuro: Alert, answering all questions appropriately. Cranial nerves grossly intact. Skin: No rashes or petechiae noted. Psych: Normal affect.   LAB RESULTS:  Lab Results  Component Value Date   NA 138 10/24/2020   K 3.9 10/24/2020   CL 100 10/24/2020   CO2 26 10/24/2020   GLUCOSE 125 (H) 10/24/2020   BUN 25 (H) 10/24/2020   CREATININE 1.57 (H) 10/24/2020   CALCIUM 9.4 10/24/2020   PROT 7.8 10/24/2020   ALBUMIN 4.0 10/24/2020   AST 15 10/24/2020   ALT 13 10/24/2020   ALKPHOS 124 10/24/2020   BILITOT 0.5 10/24/2020   GFRNONAA 34 (L) 10/24/2020   GFRAA 30 (L) 09/09/2019    Lab Results  Component Value Date   WBC 7.9 10/24/2020   NEUTROABS 5.2 10/24/2020   HGB 9.4 (L) 10/24/2020   HCT 30.2 (L) 10/24/2020   MCV 95.3 10/24/2020   PLT 220 10/24/2020   Lab Results  Component Value Date   IRON 85 05/23/2020   TIBC 421 05/23/2020   IRONPCTSAT 20 05/23/2020   Lab Results  Component Value Date   FERRITIN 50 05/23/2020     STUDIES: DG  Foot Complete Right  Result Date: 10/08/2020 CLINICAL DATA:  Pain and swelling in the right foot. EXAM: RIGHT FOOT COMPLETE - 3+ VIEW COMPARISON:  None. FINDINGS: There is no evidence of fracture or dislocation. Osteoarthritic changes at the tarsometatarsal joints. Soft tissues are unremarkable. IMPRESSION: No acute fracture or osseous lesions identified about the right foot. Electronically Signed  By: Fidela Salisbury M.D.   On: 10/08/2020 18:50    ASSESSMENT: Multiple myeloma, fractured right hip.  PLAN:    1.  Multiple myeloma: Bone marrow biopsy confirmed diagnosis with plasma cells up to 90% of biopsy.  Cytogenetics are reported as normal.  Metastatic bone survey from March 03, 2020 reviewed independently with multiple skeletal lucencies consistent with myeloma. Her M spike was initially 1.2 and initially trended down to 0.1, but more recently has trended up to 0.6.  Today's result is pending.  IgA levels initially were 2773, trended down to normal, but now increased to 978. Kappa and lambda free light chains continue to be within normal limits.  Patient's renal insufficiency and anemia are slightly worse today. Initial plan was to give Velcade on days 1, 4, 8, and 11 along with 10 mg Revlimid on days 1 through 14. Patient will also receive weekly 20 mg dexamethasone.  Revlimid was discontinued secondary to intolerance.  Patient last received single agent Velcade on June 20, 2020.  Patient's performance status is improving and she wishes to reinitiate treatment.  Return to clinic on November 14, 2020 to initiate what will be considered cycle 4, day 1 of single agent Velcade. 2.  Anemia: Hemoglobin is trended down to 9.4.  Previously, iron stores were within normal limits.  Likely multifactorial with chronic renal insufficiency, underlying myeloma, as well as history of recent GI bleed.  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  Monitor. 3.   Chronic renal insufficiency: Creatinine slightly worse today at 1.57, monitor.. 4.  Ascites: Patient does not complain of this today.  Continue follow-up with heart failure clinic as scheduled. 5.  Angiodysplastic lesions: Continue follow-up with GI as scheduled. 6. Constipation: Resolved. Continue stool softeners and MiraLAX as needed. 7.  Right hip fracture: Continue rehab and follow-up with orthopedics as scheduled. Continue Percocet as needed. 8. Thrombocytopenia: Resolved. 9.  Peripheral neuropathy: Patient now takes 300 mg gabapentin in the morning and 900 mg at night.  She also takes a PRN dose in the day if her neuropathy is worse. 10.  Right foot pain: Does not appear to be a peripheral neuropathy or related to her myeloma.  Patient was given a Medrol Dosepak today as well as a refill of her hydrocodone.  Patient expressed understanding and was in agreement with this plan. She also understands that She can call clinic at any time with any questions, concerns, or complaints.    Lloyd Huger, MD   10/24/2020 2:24 PM

## 2020-10-24 ENCOUNTER — Inpatient Hospital Stay (HOSPITAL_BASED_OUTPATIENT_CLINIC_OR_DEPARTMENT_OTHER): Payer: Medicare Other | Admitting: Oncology

## 2020-10-24 ENCOUNTER — Other Ambulatory Visit: Payer: Self-pay | Admitting: *Deleted

## 2020-10-24 ENCOUNTER — Other Ambulatory Visit: Payer: Self-pay

## 2020-10-24 ENCOUNTER — Inpatient Hospital Stay: Payer: Medicare Other | Attending: Oncology

## 2020-10-24 VITALS — BP 115/74 | HR 82 | Temp 97.5°F | Resp 18 | Wt 156.0 lb

## 2020-10-24 DIAGNOSIS — D649 Anemia, unspecified: Secondary | ICD-10-CM | POA: Insufficient documentation

## 2020-10-24 DIAGNOSIS — N189 Chronic kidney disease, unspecified: Secondary | ICD-10-CM | POA: Insufficient documentation

## 2020-10-24 DIAGNOSIS — I130 Hypertensive heart and chronic kidney disease with heart failure and stage 1 through stage 4 chronic kidney disease, or unspecified chronic kidney disease: Secondary | ICD-10-CM | POA: Diagnosis not present

## 2020-10-24 DIAGNOSIS — E1122 Type 2 diabetes mellitus with diabetic chronic kidney disease: Secondary | ICD-10-CM | POA: Insufficient documentation

## 2020-10-24 DIAGNOSIS — C9000 Multiple myeloma not having achieved remission: Secondary | ICD-10-CM | POA: Diagnosis not present

## 2020-10-24 DIAGNOSIS — Z79899 Other long term (current) drug therapy: Secondary | ICD-10-CM | POA: Insufficient documentation

## 2020-10-24 LAB — COMPREHENSIVE METABOLIC PANEL
ALT: 13 U/L (ref 0–44)
AST: 15 U/L (ref 15–41)
Albumin: 4 g/dL (ref 3.5–5.0)
Alkaline Phosphatase: 124 U/L (ref 38–126)
Anion gap: 12 (ref 5–15)
BUN: 25 mg/dL — ABNORMAL HIGH (ref 8–23)
CO2: 26 mmol/L (ref 22–32)
Calcium: 9.4 mg/dL (ref 8.9–10.3)
Chloride: 100 mmol/L (ref 98–111)
Creatinine, Ser: 1.57 mg/dL — ABNORMAL HIGH (ref 0.44–1.00)
GFR, Estimated: 34 mL/min — ABNORMAL LOW (ref >60)
Glucose, Bld: 125 mg/dL — ABNORMAL HIGH (ref 70–99)
Potassium: 3.9 mmol/L (ref 3.5–5.1)
Sodium: 138 mmol/L (ref 135–145)
Total Bilirubin: 0.5 mg/dL (ref 0.3–1.2)
Total Protein: 7.8 g/dL (ref 6.5–8.1)

## 2020-10-24 LAB — CBC WITH DIFFERENTIAL/PLATELET
Abs Immature Granulocytes: 0.06 10*3/uL (ref 0.00–0.07)
Basophils Absolute: 0 10*3/uL (ref 0.0–0.1)
Basophils Relative: 0 %
Eosinophils Absolute: 0.1 10*3/uL (ref 0.0–0.5)
Eosinophils Relative: 1 %
HCT: 30.2 % — ABNORMAL LOW (ref 36.0–46.0)
Hemoglobin: 9.4 g/dL — ABNORMAL LOW (ref 12.0–15.0)
Immature Granulocytes: 1 %
Lymphocytes Relative: 22 %
Lymphs Abs: 1.8 10*3/uL (ref 0.7–4.0)
MCH: 29.7 pg (ref 26.0–34.0)
MCHC: 31.1 g/dL (ref 30.0–36.0)
MCV: 95.3 fL (ref 80.0–100.0)
Monocytes Absolute: 0.7 10*3/uL (ref 0.1–1.0)
Monocytes Relative: 9 %
Neutro Abs: 5.2 10*3/uL (ref 1.7–7.7)
Neutrophils Relative %: 67 %
Platelets: 220 10*3/uL (ref 150–400)
RBC: 3.17 MIL/uL — ABNORMAL LOW (ref 3.87–5.11)
RDW: 16.9 % — ABNORMAL HIGH (ref 11.5–15.5)
WBC: 7.9 10*3/uL (ref 4.0–10.5)
nRBC: 0 % (ref 0.0–0.2)

## 2020-10-24 MED ORDER — PREDNISONE 10 MG (21) PO TBPK: ORAL_TABLET | ORAL | 0 refills | Status: DC

## 2020-10-24 MED ORDER — HYDROCODONE-ACETAMINOPHEN 5-325 MG PO TABS: ORAL_TABLET | ORAL | 0 refills | Status: DC

## 2020-10-24 NOTE — Progress Notes (Signed)
Pt in for follow up reports swelling in right foot and leg with pain going all the way up leg.

## 2020-10-25 LAB — PROTEIN ELECTROPHORESIS, SERUM
A/G Ratio: 1.1 (ref 0.7–1.7)
Albumin ELP: 3.6 g/dL (ref 2.9–4.4)
Alpha-1-Globulin: 0.2 g/dL (ref 0.0–0.4)
Alpha-2-Globulin: 0.9 g/dL (ref 0.4–1.0)
Beta Globulin: 1.6 g/dL — ABNORMAL HIGH (ref 0.7–1.3)
Gamma Globulin: 0.5 g/dL (ref 0.4–1.8)
Globulin, Total: 3.2 g/dL (ref 2.2–3.9)
M-Spike, %: 0.7 g/dL — ABNORMAL HIGH
Total Protein ELP: 6.8 g/dL (ref 6.0–8.5)

## 2020-10-25 LAB — IGG, IGA, IGM
IgA: 1197 mg/dL — ABNORMAL HIGH (ref 64–422)
IgG (Immunoglobin G), Serum: 182 mg/dL — ABNORMAL LOW (ref 586–1602)
IgM (Immunoglobulin M), Srm: 7 mg/dL — ABNORMAL LOW (ref 26–217)

## 2020-10-25 LAB — KAPPA/LAMBDA LIGHT CHAINS
Kappa free light chain: 6.1 mg/L (ref 3.3–19.4)
Kappa, lambda light chain ratio: 0.54 (ref 0.26–1.65)
Lambda free light chains: 11.4 mg/L (ref 5.7–26.3)

## 2020-11-08 ENCOUNTER — Other Ambulatory Visit: Payer: Self-pay

## 2020-11-08 ENCOUNTER — Ambulatory Visit
Admission: EM | Admit: 2020-11-08 | Discharge: 2020-11-08 | Disposition: A | Discharge status: Home / Self Care | Payer: Medicare Other | Attending: Family Medicine | Admitting: Family Medicine

## 2020-11-08 DIAGNOSIS — S61411A Laceration without foreign body of right hand, initial encounter: Secondary | ICD-10-CM

## 2020-11-08 DIAGNOSIS — T148XXA Other injury of unspecified body region, initial encounter: Secondary | ICD-10-CM | POA: Diagnosis not present

## 2020-11-08 NOTE — ED Provider Notes (Signed)
[facility]    CSN: [id number] Arrival date & time: 11/08/20  1250      History   Chief Complaint Chief Complaint  Patient presents with   Fall    11/08/20   Abrasion    multiple    HPI  80 year old female with an extensive past medical history presents with the above complaint.  Patient states that she got up and her "back gave out".  She states that she went to fall and caught herself with her walker.  In doing so, she suffered multiple skin tears and also has a laceration.  Skin tears noted to the dorsum of the left hand, left forearm, right elbow.  Also has a laceration to the hypothenar eminence.  Pain currently 5/10 in severity.  Bleeding is controlled.  Denies head injury or loss of consciousness.  No reports of neck pain.    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[place])    Prolonged Q-T interval on ECG    Thyroid disease     Patient Active Problem List   Diagnosis Date Noted   Chemotherapy-induced peripheral neuropathy ([place]) 10/06/2020   Hospital discharge follow-up 08/31/2020   Difficulty sleeping 07/25/2020   Difficulty walking 07/25/2020   H/O subarachnoid hemorrhage 07/25/2020   Moderate protein-calorie malnutrition ([place]) 07/04/2020   Hyperkalemia 06/19/2020   Chronic respiratory failure ([place]) 06/19/2020   Hypokalemia    Fall    Troponin I above reference range    Status post hip hemiarthroplasty    Closed nondisplaced fracture of greater trochanter of right femur ([place]) 03/30/2020   Closed displaced midcervical fracture of right femur ([place]) 03/30/2020   Hip fracture ([place]) 03/28/2020   Multiple  myeloma ([place]) 03/01/2020   Goals of care, counseling/discussion 03/01/2020   Osteomyelitis ([place]) 01/20/2020   Gout 10/27/2019   Acute blood loss anemia    Gastrointestinal hemorrhage    UGI bleed 09/04/2019   Drug-induced constipation 07/26/2019   Insomnia due to medical condition 07/26/2019   Benign hypertensive kidney disease with chronic kidney disease 07/20/2019   Proteinuria 07/20/2019   Hyposmolality and/or hyponatremia 07/20/2019   Dehydration    Anemia in chronic kidney disease (CKD) 07/04/2019   Stage 3b chronic kidney disease ([place]) 07/04/2019   Chronic respiratory failure with hypoxia ([place]) 07/03/2019   Hyponatremia 07/03/2019   Prolonged Q-T interval on ECG    Weakness    Anemia    MDD (major depressive disorder), recurrent episode, 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   Tobacco use disorder 12/14/2018   At risk for prolonged QT interval syndrome 12/14/2018   Hypothyroidism, acquired, 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   Pedal edema 02/19/2018   SOB (shortness of breath) on exertion 02/19/2018   Left main coronary artery disease 03/07/2017   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[place]) 03/06/2017   Ventricular tachycardia ([place]) 03/05/2017   Defibrillator discharge 03/04/2017   CHF (congestive heart failure), NYHA class III, chronic, combined ([place]) 03/04/2017   COPD (chronic obstructive pulmonary disease) ([place]) 02/10/2017   Diabetes mellitus type 2, uncomplicated ([place]) 48/18/5631   H/O ventricular fibrillation 02/10/2017  Hyperlipidemia 02/10/2017   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   Ischemic cardiomyopathy 02/10/2017   Myocardial infarction ([place]) 02/10/2017   Moderate mitral regurgitation 02/10/2017   Syncope 02/04/2017   CAD (coronary artery disease) 02/04/2017   HTN (hypertension) 02/04/2017   Type 2 diabetes mellitus with stage 3 chronic kidney disease ([place]) 02/04/2017    Past Surgical History:  Procedure Laterality Date   CENTRAL LINE INSERTION  03/11/2017    Procedure: CENTRAL LINE INSERTION;  Surgeon: Leonie Man, MD;  Location: [facility];  Service: Cardiovascular;;   CHOLECYSTECTOMY     COLONOSCOPY WITH PROPOFOL N/A 09/01/2019   Procedure: COLONOSCOPY WITH PROPOFOL;  Surgeon: Toledo, Benay Pike, MD;  Location: [facility];  Service: Gastroenterology;  Laterality: N/A;   CORONARY STENT INTERVENTION W/IMPELLA N/A 03/11/2017   Procedure: Coronary Stent Intervention w/Impella;  Surgeon: Leonie Man, MD;  Location: [place] [facility];  Service: Cardiovascular;  Laterality: N/A;   ESOPHAGOGASTRODUODENOSCOPY (EGD) WITH PROPOFOL N/A 09/01/2019   Procedure: ESOPHAGOGASTRODUODENOSCOPY (EGD) WITH PROPOFOL;  Surgeon: Toledo, Benay Pike, MD;  Location: [facility];  Service: Gastroenterology;  Laterality: N/A;   ESOPHAGOGASTRODUODENOSCOPY (EGD) WITH PROPOFOL N/A 09/08/2019   Procedure: ESOPHAGOGASTRODUODENOSCOPY (EGD) WITH PROPOFOL;  Surgeon: [name], MD;  Location: [facility];  Service: Gastroenterology;  Laterality: N/A;   EYE SURGERY     HIP ARTHROPLASTY Right 03/29/2020   Procedure: ARTHROPLASTY BIPOLAR HIP (HEMIARTHROPLASTY);  Surgeon: Corky Mull, MD;  Location: [facility];  Service: Orthopedics;  Laterality: Right;   INTRAVASCULAR PRESSURE WIRE/FFR STUDY N/A 03/11/2017   Procedure: INTRAVASCULAR PRESSURE WIRE/FFR STUDY;  Surgeon: Leonie Man, MD;  Location: [facility];  Service: Cardiovascular;  Laterality: N/A;   INTRAVASCULAR ULTRASOUND/IVUS N/A 03/11/2017   Procedure: Intravascular Ultrasound/IVUS;  Surgeon: Leonie Man, MD;  Location: [facility];  Service: Cardiovascular;  Laterality: N/A;   LEFT HEART CATH AND CORONARY ANGIOGRAPHY N/A 03/05/2017   Procedure: LEFT HEART CATH AND CORONARY ANGIOGRAPHY;  Surgeon: Isaias Cowman, MD;  Location: [facility];  Service: Cardiovascular;  Laterality: N/A;   PACEMAKER IMPLANT     pacemaker/defibrillator Left     OB History   No  obstetric history on file.      Home Medications    Prior to Admission medications   Medication Sig Start Date End Date Taking? Authorizing Provider  allopurinol (ZYLOPRIM) 100 MG tablet Take 50 mg by mouth daily.   Yes [provider]  aspirin EC 81 MG tablet Take 81 mg by mouth at bedtime.    Yes [provider]  fexofenadine (ALLEGRA) 180 MG tablet Take 180 mg by mouth daily.   Yes [provider]  FLUoxetine (PROZAC) 10 MG capsule Take 10 mg by mouth daily. 06/29/19  Yes [provider]  gabapentin (NEURONTIN) 300 MG capsule Take 381m in AM, 3052min afternoon, 90073mt night 10/06/20  Yes Vaslow, ZacAcey LavD  gabapentin (NEURONTIN) 600 MG tablet Take 600 mg by mouth daily. 11/07/20  Yes [provider]  HYDROcodone-acetaminophen (NORCO/VICODIN) 5-325 MG tablet SMARTSIG:1 Tablet(s) By Mouth Every 12 Hours PRN 10/24/20  Yes FinLloyd HugerD  levothyroxine (SYNTHROID) 50 MCG tablet Take by mouth. 08/14/20  Yes [provider]  levothyroxine (SYNTHROID) 75 MCG tablet Take 1 tablet (75 mcg total) by mouth daily before breakfast. On Monday, Wednesday and Friday 04/03/20  Yes AmiLorella NimrodD  magnesium oxide (MAG-OX) 400 MG tablet Take 200 mg by mouth daily.  Yes [provider]  mexiletine (MEXITIL) 200 MG capsule Take 1 capsule (200 mg total) by mouth every 12 (twelve) hours. 03/14/17  Yes Seiler, Amber K, NP  midodrine (PROAMATINE) 10 MG tablet Take 1 tablet (10 mg total) by mouth 2 (two) times daily with a meal. 04/03/20  Yes [name], MD  ondansetron (ZOFRAN) 8 MG tablet Take by mouth every 8 (eight) hours as needed for nausea or vomiting.   Yes [provider]  OXYGEN Inhale 2 L into the lungs continuous.   Yes [provider]  pantoprazole (PROTONIX) 40 MG tablet Take 40 mg by mouth daily.  05/16/19  Yes [provider]  simvastatin (ZOCOR) 40 MG tablet Take 20 mg by mouth at bedtime.     Yes [provider]  traZODone (DESYREL) 50 MG tablet Take 50 mg by mouth at bedtime.   Yes [provider]  docusate sodium (COLACE) 100 MG capsule Take 100 mg by mouth 2 (two) times daily.    [provider]  Ensure Max Protein (ENSURE MAX PROTEIN) LIQD Take 330 mLs (11 oz total) by mouth 2 (two) times daily between meals. Patient not taking: Reported on 10/24/2020 04/03/20   [name], MD  hydrocortisone (ANUSOL-HC) 2.5 % rectal cream Apply 1 application topically 2 (two) times daily. Patient not taking: Reported on 10/24/2020 02/12/20   [provider]  Lactulose 20 GM/30ML SOLN Take 30 mLs (20 g total) by mouth in the morning and at bedtime. Patient not taking: Reported on 10/24/2020 06/23/20   Jacquelin Hawking, NP  polyethylene glycol (MIRALAX) 17 g packet Take 17 g by mouth daily. 06/18/20   Nance Pear, MD  Prenatal Vit-Fe Fumarate-FA (PRENATAL MULTIVITAMIN) TABS tablet Take 1 tablet by mouth daily.    [provider]    Family History Family History  Problem Relation Age of Onset   Hypertension Father    Heart attack Father    Depression Sister    Depression Brother    Depression Brother     Social History Social History   Tobacco Use   Smoking status: Every Day    Packs/day: 0.25    Pack years: 0.00    Types: E-cigarettes, Cigarettes   Smokeless tobacco: Never  Vaping Use   Vaping Use: Former  Substance Use Topics   Alcohol use: Not Currently    Comment: occasionally   Drug use: Yes    Comment: prescribed pain meds     Allergies   Celebrex [celecoxib], Glipizide, Levaquin [levofloxacin in d5w], Levofloxacin, Lisinopril, Sulfa antibiotics, Metformin, and Penicillins   Review of Systems Review of Systems Per HPI  Physical Exam Triage Vital Signs ED Triage Vitals  Enc Vitals Group     BP 11/08/20 1357 115/70     Pulse Rate 11/08/20 1357 83     Resp 11/08/20 1357 18     Temp 11/08/20 1357 97.8 F (36.6 C)      Temp Source 11/08/20 1357 Oral     SpO2 11/08/20 1357 100 %     Weight 11/08/20 1353 155 lb (70.3 kg)     Height 11/08/20 1353 5' 3"  (1.6 m)     Head Circumference --      Peak Flow --      Pain Score 11/08/20 1352 5     Pain Loc --      Pain Edu? --      Excl. in [place]? --     Updated Vital Signs BP  115/70 (BP Location: Left Arm)   Pulse 83   Temp 97.8 F (36.6 C) (Oral)   Resp 18   Ht 5' 3"  (1.6 m)   Wt 70.3 kg   SpO2 100%   BMI 27.46 kg/m   Visual Acuity Right Eye Distance:   Left Eye Distance:   Bilateral Distance:    Right Eye Near:   Left Eye Near:    Bilateral Near:     Physical Exam Constitutional:      Comments: Chronic ill-appearing female. no acute distress.  HENT:     Head: Normocephalic and atraumatic.  Eyes:     General:        Right eye: No discharge.        Left eye: No discharge.     Conjunctiva/sclera: Conjunctivae normal.  Cardiovascular:     Rate and Rhythm: Normal rate and regular rhythm.  Pulmonary:     Effort: Pulmonary effort is normal. No respiratory distress.  Musculoskeletal:       Hands:     Comments: Curved laceration noted as in the picture.  Measures approximately 5 cm.  Skin:    Comments: Multiple skin tears -dorsum left hand, left forearm, right elbow.  Neurological:     General: No focal deficit present.     Mental Status: She is alert. Mental status is at baseline.  Psychiatric:        Mood and Affect: Mood normal.        Behavior: Behavior normal.     UC Treatments / Results  Labs (all labs ordered are listed, but only abnormal results are displayed) Labs Reviewed - No data to display  EKG   Radiology No results found.  Procedures Laceration Repair  Date/Time: 11/08/2020 5:57 PM Performed by: Coral Spikes, DO Authorized by: Coral Spikes, DO   Consent:    Consent obtained:  Verbal   Consent given by:  Patient Anesthesia:    Anesthesia method:  Local infiltration   Local anesthetic:  Lidocaine 1%  WITH epi Laceration details:    Location:  Hand   Hand location:  R palm   Length (cm):  5 Pre-procedure details:    Preparation:  Patient was prepped and draped in usual sterile fashion Exploration:    Hemostasis achieved with:  Epinephrine and direct pressure   Contaminated: no   Treatment:    Area cleansed with:  Povidone-iodine   Amount of cleaning:  Standard Skin repair:    Repair method:  Sutures   Suture size:  5-0   Suture material:  Prolene   Suture technique:  Simple interrupted   Number of sutures:  12 Approximation:    Approximation:  Close Repair type:    Repair type:  Simple Post-procedure details:    Dressing:  Antibiotic ointment   Procedure completion:  Tolerated well, no immediate complications (including critical care time)  Medications Ordered in UC Medications - No data to display  Initial Impression / Assessment and Plan / UC Course  I have reviewed the triage vital signs and the nursing notes.  Pertinent labs & imaging results that were available during my care of the patient were reviewed by me and considered in my medical decision making (see chart for details).    80 year old female presents with multiple skin tears and laceration to the right hand.  Laceration repaired as above.  Skin tears were dressed.  Sutures out in 10 days.  Final Clinical Impressions(s) / UC Diagnoses  Final diagnoses:  Laceration of right hand without foreign body, initial encounter  Multiple skin tears     Discharge Instructions      Rest.  Keep the wounds clean.  Sutures out in 10 days.  Take care  Dr. Lacinda Axon    ED Prescriptions   None    PDMP not reviewed this encounter.   Coral Spikes, DO 11/08/20 1758

## 2020-11-08 NOTE — Discharge Instructions (Addendum)
Rest.  Keep the wounds clean.  Sutures out in 10 days.  Take care  Dr. Lacinda Axon

## 2020-11-08 NOTE — ED Triage Notes (Signed)
Pt c/o fall this morning, states her back and hip seemed to give out and she fell against her walker. Pt denies hitting her head or having LOC. Pt has multiple skin tears, to her right palm, right elbow, top of left hand and left forearm.

## 2020-11-10 ENCOUNTER — Ambulatory Visit (HOSPITAL_COMMUNITY)
Admission: RE | Admit: 2020-11-10 | Discharge: 2020-11-10 | Disposition: A | Discharge status: Home / Self Care | Payer: Medicare Other | Source: Ambulatory Visit | Attending: Physical Medicine & Rehabilitation | Admitting: Physical Medicine & Rehabilitation

## 2020-11-10 ENCOUNTER — Other Ambulatory Visit: Payer: Self-pay

## 2020-11-10 DIAGNOSIS — M5441 Lumbago with sciatica, right side: Secondary | ICD-10-CM | POA: Insufficient documentation

## 2020-11-10 DIAGNOSIS — G8929 Other chronic pain: Secondary | ICD-10-CM | POA: Insufficient documentation

## 2020-11-10 NOTE — Progress Notes (Signed)
Informed of MRI for today.   Device system confirmed to be MRI conditional, with implant date > 6 weeks ago, and no evidence of abandoned or epicardial leads in review of most recent CXR Interrogation from today reviewed, pt is currently AS-VS at ~85 bpm Change device settings for MRI to MRI mode, pacing off for procedure.   Tachy-therapies to off if applicable.  Program device back to pre-MRI settings after completion of exam.  Annamaria Helling  11/10/2020 1:29 PM

## 2020-11-10 NOTE — Progress Notes (Signed)
Patient here today at [facility] for MRI lumbar spine WO contrast. Patient has [employer] ICD. Transmission sent to [place] reps Lara/Bryan. Orders received from Andy-Cardiology PA for pacing off, tachy therapies off, dual coil lead. Will re-program once scan is complete.

## 2020-11-11 NOTE — Progress Notes (Signed)
[facility]  [phone] [phone]  ID: Heather Peterson OB: [birth year]  MR#: [id number]  PYK#:[id number]  [name], MD as PCP - General (Family Medicine) Lloyd Huger, MD as Consulting Physician (Oncology)  CHIEF COMPLAINT: Multiple myeloma, subdural hematoma.  INTERVAL HISTORY: Patient returns to clinic today for further evaluation and reinitiation of single agent Velcade.  She has noted increased lethargy and "loopiness" during the day since increasing her nighttime dose of gabapentin.  She continues to have chronic weakness and fatigue.  She has intermittent pain in her right foot that radiates up her leg through her knee.  She continues have chronic weakness and fatigue. She has no other neurologic complaints.  She has chronic shortness of breath and requires oxygen 24 hours/day.  She denies any fevers.  She has a fair appetite.  She denies any chest pain, cough, or hemoptysis. She denies any nausea, vomiting, constipation, or diarrhea. She has no melena or hematochezia.  She has no urinary complaints.  Patient offers no further specific complaints today.  REVIEW OF SYSTEMS:   Review of Systems  Constitutional:  Positive for malaise/fatigue. Negative for fever.  Respiratory:  Positive for shortness of breath. Negative for cough and hemoptysis.   Cardiovascular: Negative.  Negative for chest pain and leg swelling.  Gastrointestinal: Negative.  Negative for abdominal pain, diarrhea, melena and nausea.  Genitourinary: Negative.  Negative for dysuria and hematuria.  Musculoskeletal:  Positive for back pain. Negative for joint pain.  Skin: Negative.  Negative for rash.  Neurological:  Positive for sensory change and weakness. Negative for dizziness, focal weakness and headaches.  Psychiatric/Behavioral: Negative.  The patient is not nervous/anxious.    As per HPI. Otherwise, a complete review of systems is  negative.  PAST MEDICAL HISTORY: Past Medical History:  Diagnosis Date   Anxiety    Chronic combined systolic and diastolic CHF (congestive heart failure) (HCC)    Chronic kidney disease    Coronary artery disease    Depression    Diabetes mellitus without complication (HCC)    Diabetes mellitus, type II ([place])    Hypertension    MI (myocardial infarction) ([place])    x 5   Pacemaker    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[place])    Prolonged Q-T interval on ECG    Thyroid disease     PAST SURGICAL HISTORY: Past Surgical History:  Procedure Laterality Date   CENTRAL LINE INSERTION  03/11/2017   Procedure: CENTRAL LINE INSERTION;  Surgeon: Leonie Man, MD;  Location: [facility];  Service: Cardiovascular;;   CHOLECYSTECTOMY     COLONOSCOPY WITH PROPOFOL N/A 09/01/2019   Procedure: COLONOSCOPY WITH PROPOFOL;  Surgeon: Toledo, Benay Pike, MD;  Location: [facility];  Service: Gastroenterology;  Laterality: N/A;   CORONARY STENT INTERVENTION W/IMPELLA N/A 03/11/2017   Procedure: Coronary Stent Intervention w/Impella;  Surgeon: Leonie Man, MD;  Location: [facility];  Service: Cardiovascular;  Laterality: N/A;   ESOPHAGOGASTRODUODENOSCOPY (EGD) WITH PROPOFOL N/A 09/01/2019   Procedure: ESOPHAGOGASTRODUODENOSCOPY (EGD) WITH PROPOFOL;  Surgeon: Toledo, Benay Pike, MD;  Location: [facility];  Service: Gastroenterology;  Laterality: N/A;   ESOPHAGOGASTRODUODENOSCOPY (EGD) WITH PROPOFOL N/A 09/08/2019   Procedure: ESOPHAGOGASTRODUODENOSCOPY (EGD) WITH PROPOFOL;  Surgeon: [name], MD;  Location: [facility];  Service: Gastroenterology;  Laterality: N/A;   EYE SURGERY     HIP ARTHROPLASTY Right 03/29/2020   Procedure: ARTHROPLASTY BIPOLAR HIP (HEMIARTHROPLASTY);  Surgeon: Corky Mull,  MD;  Location: [facility];  Service: Orthopedics;  Laterality: Right;   INTRAVASCULAR PRESSURE WIRE/FFR STUDY N/A 03/11/2017   Procedure: INTRAVASCULAR PRESSURE WIRE/FFR STUDY;  Surgeon:  Leonie Man, MD;  Location: [place];  Service: Cardiovascular;  Laterality: N/A;   INTRAVASCULAR ULTRASOUND/IVUS N/A 03/11/2017   Procedure: Intravascular Ultrasound/IVUS;  Surgeon: Leonie Man, MD;  Location: [facility];  Service: Cardiovascular;  Laterality: N/A;   LEFT HEART CATH AND CORONARY ANGIOGRAPHY N/A 03/05/2017   Procedure: LEFT HEART CATH AND CORONARY ANGIOGRAPHY;  Surgeon: Isaias Cowman, MD;  Location: [facility];  Service: Cardiovascular;  Laterality: N/A;   PACEMAKER IMPLANT     pacemaker/defibrillator Left     FAMILY HISTORY: Family History  Problem Relation Age of Onset   Hypertension Father    Heart attack Father    Depression Sister    Depression Brother    Depression Brother     ADVANCED DIRECTIVES (Y/N):  N  HEALTH MAINTENANCE: Social History   Tobacco Use   Smoking status: Every Day    Packs/day: 0.25    Pack years: 0.00    Types: E-cigarettes, Cigarettes   Smokeless tobacco: Never  Vaping Use   Vaping Use: Former  Substance Use Topics   Alcohol use: Not Currently    Comment: occasionally   Drug use: Yes    Comment: prescribed pain meds     Colonoscopy:  PAP:  Bone density:  Lipid panel:  Allergies  Allergen Reactions   Celebrex [Celecoxib] Anaphylaxis   Glipizide Anaphylaxis   Levaquin [Levofloxacin In D5w] Other (See Comments)    Heart arrhthymias   Levofloxacin Other (See Comments) and Palpitations    ICD fired   Lisinopril Swelling    Lip and facial swelling   Sulfa Antibiotics Other (See Comments) and Anaphylaxis    Reaction: unknown   Metformin Other (See Comments)    Gi tolerance    Penicillins Rash and Other (See Comments)    Has patient had a PCN reaction causing immediate rash, facial/tongue/throat swelling, SOB or lightheadedness with hypotension: Unknown Has patient had a PCN reaction causing severe rash involving mucus membranes or skin necrosis: No Has patient had a PCN  reaction that required hospitalization: No Has patient had a PCN reaction occurring within the last 10 years: No If all of the above answers are "NO", then may proceed with Cephalosporin use.     Current Outpatient Medications  Medication Sig Dispense Refill   allopurinol (ZYLOPRIM) 100 MG tablet Take 50 mg by mouth daily.     aspirin EC 81 MG tablet Take 81 mg by mouth at bedtime.      docusate sodium (COLACE) 100 MG capsule Take 100 mg by mouth 2 (two) times daily.     fexofenadine (ALLEGRA) 180 MG tablet Take 180 mg by mouth daily.     FLUoxetine (PROZAC) 10 MG capsule Take 10 mg by mouth daily.     gabapentin (NEURONTIN) 300 MG capsule Take 320m in AM, 306min afternoon, 90014mt night 150 capsule 3   gabapentin (NEURONTIN) 600 MG tablet Take 600 mg by mouth daily.     HYDROcodone-acetaminophen (NORCO/VICODIN) 5-325 MG tablet SMARTSIG:1 Tablet(s) By Mouth Every 12 Hours PRN 60 tablet 0   levothyroxine (SYNTHROID) 50 MCG tablet Take by mouth.     levothyroxine (SYNTHROID) 75 MCG tablet Take 1 tablet (75 mcg total) by mouth daily before breakfast. On Monday, Wednesday and Friday     magnesium oxide (  MAG-OX) 400 MG tablet Take 200 mg by mouth daily.     mexiletine (MEXITIL) 200 MG capsule Take 1 capsule (200 mg total) by mouth every 12 (twelve) hours. 60 capsule 1   midodrine (PROAMATINE) 10 MG tablet Take 1 tablet (10 mg total) by mouth 2 (two) times daily with a meal.     ondansetron (ZOFRAN) 8 MG tablet Take by mouth every 8 (eight) hours as needed for nausea or vomiting.     OXYGEN Inhale 2 L into the lungs continuous.     pantoprazole (PROTONIX) 40 MG tablet Take 40 mg by mouth daily.      Prenatal Vit-Fe Fumarate-FA (PRENATAL MULTIVITAMIN) TABS tablet Take 1 tablet by mouth daily.     simvastatin (ZOCOR) 40 MG tablet Take 20 mg by mouth at bedtime.   2   traZODone (DESYREL) 50 MG tablet Take 50 mg by mouth at bedtime.     dexamethasone (DECADRON) 4 MG tablet Take 5 tablets (20 mg  total) by mouth daily. Take the day after Velcade on days 2,5,9,12. Take with breakfast 40 tablet 3   Ensure Max Protein (ENSURE MAX PROTEIN) LIQD Take 330 mLs (11 oz total) by mouth 2 (two) times daily between meals. (Patient not taking: Reported on 10/24/2020)     hydrocortisone (ANUSOL-HC) 2.5 % rectal cream Apply 1 application topically 2 (two) times daily. (Patient not taking: Reported on 10/24/2020)     Lactulose 20 GM/30ML SOLN Take 30 mLs (20 g total) by mouth in the morning and at bedtime. (Patient not taking: Reported on 10/24/2020) 450 mL 1   polyethylene glycol (MIRALAX) 17 g packet Take 17 g by mouth daily. (Patient not taking: Reported on 11/14/2020) 14 each 0   No current facility-administered medications for this visit.    OBJECTIVE: Vitals:   11/14/20 1343  BP: (!) 98/55  Pulse: 89  Resp: 17  Temp: 99.5 F (37.5 C)  SpO2: 98%     Body mass index is 28.71 kg/m.    ECOG FS:1 - Symptomatic but completely ambulatory  General: Well-developed, well-nourished, no acute distress.  Sitting in a wheelchair. Eyes: Pink conjunctiva, anicteric sclera. HEENT: Normocephalic, moist mucous membranes. Lungs: No audible wheezing or coughing. Heart: Regular rate and rhythm. Abdomen: Soft, nontender, no obvious distention. Musculoskeletal: No edema, cyanosis, or clubbing. Neuro: Alert, answering all questions appropriately. Cranial nerves grossly intact. Skin: No rashes or petechiae noted. Psych: Normal affect.   LAB RESULTS:  Lab Results  Component Value Date   NA 137 11/14/2020   K 4.1 11/14/2020   CL 100 11/14/2020   CO2 27 11/14/2020   GLUCOSE 127 (H) 11/14/2020   BUN 27 (H) 11/14/2020   CREATININE 1.67 (H) 11/14/2020   CALCIUM 8.8 (L) 11/14/2020   PROT 7.2 11/14/2020   ALBUMIN 3.6 11/14/2020   AST 15 11/14/2020   ALT 13 11/14/2020   ALKPHOS 123 11/14/2020   BILITOT 0.8 11/14/2020   GFRNONAA 31 (L) 11/14/2020   GFRAA 30 (L) 09/09/2019    Lab Results  Component Value  Date   WBC 7.6 11/14/2020   NEUTROABS 5.1 11/14/2020   HGB 8.4 (L) 11/14/2020   HCT 27.8 (L) 11/14/2020   MCV 94.6 11/14/2020   PLT 207 11/14/2020   Lab Results  Component Value Date   IRON 85 05/23/2020   TIBC 421 05/23/2020   IRONPCTSAT 20 05/23/2020   Lab Results  Component Value Date   FERRITIN 50 05/23/2020     STUDIES: MR LUMBAR SPINE  WO CONTRAST  Result Date: 11/11/2020 CLINICAL DATA:  Chronic low back pain with right-sided sciatica EXAM: MRI LUMBAR SPINE WITHOUT CONTRAST TECHNIQUE: Multiplanar, multisequence MR imaging of the lumbar spine was performed. No intravenous contrast was administered. COMPARISON:  CT scan 06/18/2020 FINDINGS: Segmentation: The lowest lumbar type non-rib-bearing vertebra is labeled as L5. The T12 ribs are hypoplastic. Alignment:  3 mm of degenerative grade 1 anterolisthesis at L3-4. Vertebrae: Marrow heterogeneity is present. Although this can be caused by marrow infiltrative processes, the most common causes include anemia, smoking, obesity, or advancing age. Mild type 2 degenerative endplate findings at J8-5. Disc desiccation at all lumbar levels. Loss of disc height especially at L4-5. Conus medullaris and cauda equina: Conus extends to the T12 level. Conus and cauda equina appear normal. Paraspinal and other soft tissues: Scarring in the right kidney lower pole laterally and also in the left mid kidney. A fluid signal intensity lesion of the left kidney is partially characterized on today's exam. This is statistically likely to be a cyst but not technically specific. Left adrenal adenoma. There is a small but abnormal amount of free pelvic fluid. 0.6 by 0.4 cm nonspecific nodule posteriorly along this free pelvic fluid on image 7 series 3. Sigmoid diverticulosis. Disc levels: L1-2: No impingement.  Mild degenerative right facet arthropathy. L2-3: Borderline bilateral foraminal stenosis due to disc bulge and facet arthropathy. L3-4: No impingement. Disc  bulge and facet arthropathy with small left inferior foraminal disc protrusion. L4-5: Mild right and borderline left subarticular lateral recess stenosis with borderline right foraminal stenosis due to intervertebral and facet spurring along with disc bulge and small central disc protrusion. L5-S1: No impingement. Small central disc protrusion. Intervertebral spurring. IMPRESSION: 1. Lumbar spondylosis and degenerative disc disease, causing mild impingement at L4-5 and borderline impingement at L2-3. 2. Small but abnormal amount of free pelvic fluid with a small nonspecific nodule posteriorly along the margin of the free fluid, significance uncertain. 3. Marrow heterogeneity is present. Although this can be caused by marrow infiltrative processes, the most common causes include anemia, smoking, obesity, or advancing age. 4. Sigmoid diverticulosis. Electronically Signed   By: Van Clines M.D.   On: 11/11/2020 12:08    ASSESSMENT: Multiple myeloma, fractured right hip.  PLAN:    1.  Multiple myeloma: Bone marrow biopsy confirmed diagnosis with plasma cells up to 90% of biopsy.  Cytogenetics are reported as normal.  Metastatic bone survey from March 03, 2020 reviewed independently with multiple skeletal lucencies consistent with myeloma. Her M spike was initially 1.2 and initially trended down to 0.1, but more recently has trended up to 0.7 IgA levels initially were 2773, trended down to normal, but now increased to 1197. Kappa and lambda free light chains continue to be within normal limits.  Patient's creatinine continues to trend up and is now 1.67.  She also has progressive anemia with a hemoglobin of 8.4.  Initial plan was to give Velcade on days 1, 4, 8, and 11 along with 10 mg Revlimid on days 1 through 14. Patient will also receive weekly 20 mg dexamethasone.  Revlimid was discontinued secondary to intolerance.  Patient last received single agent Velcade on June 20, 2020.  Will reinitiate  Velcade today with cycle 4, day 1.  Return to clinic Friday for Velcade only, and then 1 week for Velcade and further evaluation.  2.  Anemia: Hemoglobin continues to trend down is now 8.4.  Previously, iron stores were within normal limits.  Likely multifactorial with  chronic renal insufficiency, underlying myeloma, as well as history of recent GI bleed.  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  Reinitiate Velcade as above. 3.  Chronic renal insufficiency: Creatinine trending up.  Velcade as above. 4.  Ascites: Patient does not complain of this today.  Continue follow-up with heart failure clinic as scheduled. 5.  Angiodysplastic lesions: Continue follow-up with GI as scheduled. 6. Constipation: Resolved. Continue stool softeners and MiraLAX as needed. 7.  Right hip fracture: Continue rehab and follow-up with orthopedics as scheduled. Continue Percocet as needed. 8. Thrombocytopenia: Resolved. 9.  Peripheral neuropathy: I have instructed patient to decrease her nighttime gabapentin dose to 700 mg.  She also takes 100 mg in the morning and in the afternoon.   10.  Right foot pain: Does not appear to be a peripheral neuropathy or related to her myeloma.  Continue hydrocodone as needed.  Patient expressed understanding and was in agreement with this plan. She also understands that She can call clinic at any time with any questions, concerns, or complaints.    Lloyd Huger, MD   11/14/2020 4:56 PM

## 2020-11-14 ENCOUNTER — Encounter: Payer: Self-pay | Admitting: Oncology

## 2020-11-14 ENCOUNTER — Inpatient Hospital Stay: Payer: Medicare Other

## 2020-11-14 ENCOUNTER — Inpatient Hospital Stay (HOSPITAL_BASED_OUTPATIENT_CLINIC_OR_DEPARTMENT_OTHER): Payer: Medicare Other | Admitting: Oncology

## 2020-11-14 ENCOUNTER — Inpatient Hospital Stay: Payer: Medicare Other | Attending: Oncology

## 2020-11-14 VITALS — BP 98/55 | HR 89 | Temp 99.5°F | Resp 17 | Wt 162.1 lb

## 2020-11-14 DIAGNOSIS — D649 Anemia, unspecified: Secondary | ICD-10-CM | POA: Diagnosis not present

## 2020-11-14 DIAGNOSIS — Z5111 Encounter for antineoplastic chemotherapy: Secondary | ICD-10-CM | POA: Diagnosis present

## 2020-11-14 DIAGNOSIS — C9000 Multiple myeloma not having achieved remission: Secondary | ICD-10-CM | POA: Insufficient documentation

## 2020-11-14 DIAGNOSIS — N189 Chronic kidney disease, unspecified: Secondary | ICD-10-CM | POA: Diagnosis not present

## 2020-11-14 DIAGNOSIS — Z79899 Other long term (current) drug therapy: Secondary | ICD-10-CM | POA: Insufficient documentation

## 2020-11-14 LAB — CBC WITH DIFFERENTIAL/PLATELET
Abs Immature Granulocytes: 0.13 10*3/uL — ABNORMAL HIGH (ref 0.00–0.07)
Basophils Absolute: 0 10*3/uL (ref 0.0–0.1)
Basophils Relative: 0 %
Eosinophils Absolute: 0.1 10*3/uL (ref 0.0–0.5)
Eosinophils Relative: 2 %
HCT: 27.8 % — ABNORMAL LOW (ref 36.0–46.0)
Hemoglobin: 8.4 g/dL — ABNORMAL LOW (ref 12.0–15.0)
Immature Granulocytes: 2 %
Lymphocytes Relative: 19 %
Lymphs Abs: 1.4 10*3/uL (ref 0.7–4.0)
MCH: 28.6 pg (ref 26.0–34.0)
MCHC: 30.2 g/dL (ref 30.0–36.0)
MCV: 94.6 fL (ref 80.0–100.0)
Monocytes Absolute: 0.8 10*3/uL (ref 0.1–1.0)
Monocytes Relative: 11 %
Neutro Abs: 5.1 10*3/uL (ref 1.7–7.7)
Neutrophils Relative %: 66 %
Platelets: 207 10*3/uL (ref 150–400)
RBC: 2.94 MIL/uL — ABNORMAL LOW (ref 3.87–5.11)
RDW: 17.7 % — ABNORMAL HIGH (ref 11.5–15.5)
WBC: 7.6 10*3/uL (ref 4.0–10.5)
nRBC: 0.4 % — ABNORMAL HIGH (ref 0.0–0.2)

## 2020-11-14 LAB — COMPREHENSIVE METABOLIC PANEL
ALT: 13 U/L (ref 0–44)
AST: 15 U/L (ref 15–41)
Albumin: 3.6 g/dL (ref 3.5–5.0)
Alkaline Phosphatase: 123 U/L (ref 38–126)
Anion gap: 10 (ref 5–15)
BUN: 27 mg/dL — ABNORMAL HIGH (ref 8–23)
CO2: 27 mmol/L (ref 22–32)
Calcium: 8.8 mg/dL — ABNORMAL LOW (ref 8.9–10.3)
Chloride: 100 mmol/L (ref 98–111)
Creatinine, Ser: 1.67 mg/dL — ABNORMAL HIGH (ref 0.44–1.00)
GFR, Estimated: 31 mL/min — ABNORMAL LOW (ref >60)
Glucose, Bld: 127 mg/dL — ABNORMAL HIGH (ref 70–99)
Potassium: 4.1 mmol/L (ref 3.5–5.1)
Sodium: 137 mmol/L (ref 135–145)
Total Bilirubin: 0.8 mg/dL (ref 0.3–1.2)
Total Protein: 7.2 g/dL (ref 6.5–8.1)

## 2020-11-14 MED ORDER — DEXAMETHASONE 4 MG PO TABS: 20.0000 mg | ORAL_TABLET | Freq: Every day | ORAL | 3 refills | Status: DC

## 2020-11-14 MED ORDER — DEXAMETHASONE 4 MG PO TABS
20.0000 mg | ORAL_TABLET | Freq: Once | ORAL | Status: AC
Start: 2020-11-14 — End: 2020-11-14
  Administered 2020-11-14: 20 mg via ORAL
  Filled 2020-11-14: qty 5

## 2020-11-14 MED ORDER — BORTEZOMIB CHEMO SQ INJECTION 3.5 MG (2.5MG/ML)
1.3000 mg/m2 | Freq: Once | INTRAMUSCULAR | Status: AC
  Administered 2020-11-14: 2.5 mg via SUBCUTANEOUS
  Filled 2020-11-14: qty 1

## 2020-11-14 NOTE — Patient Instructions (Signed)
[facility] ONCOLOG[facility]  Discharge Instructions: Thank you for choosing [facility] to provide your oncology and hematology care.  If you have a lab appointment with the [facility], please go directly to the [facility] and check in at the registration area.  Wear comfortable clothing and clothing appropriate for easy access to any Portacath or PICC line.   We strive to give you quality time with your provider. You may need to reschedule your appointment if you arrive late (15 or more minutes).  Arriving late affects you and other patients whose appointments are after yours.  Also, if you miss three or more appointments without notifying the office, you may be dismissed from the clinic at the provider's discretion.      For prescription refill requests, have your pharmacy contact our office and allow 72 hours for refills to be completed.    Today you received the following chemotherapy and/or immunotherapy agents : Velcade   To help prevent nausea and vomiting after your treatment, we encourage you to take your nausea medication as directed.  BELOW ARE SYMPTOMS THAT SHOULD BE REPORTED IMMEDIATELY: *FEVER GREATER THAN 100.4 F (38 C) OR HIGHER *CHILLS OR SWEATING *NAUSEA AND VOMITING THAT IS NOT CONTROLLED WITH YOUR NAUSEA MEDICATION *UNUSUAL SHORTNESS OF BREATH *UNUSUAL BRUISING OR BLEEDING *URINARY PROBLEMS (pain or burning when urinating, or frequent urination) *BOWEL PROBLEMS (unusual diarrhea, constipation, pain near the anus) TENDERNESS IN MOUTH AND THROAT WITH OR WITHOUT PRESENCE OF ULCERS (sore throat, sores in mouth, or a toothache) UNUSUAL RASH, SWELLING OR PAIN  UNUSUAL VAGINAL DISCHARGE OR ITCHING   Items with * indicate a potential emergency and should be followed up as soon as possible or go to the Emergency Department if any problems should occur.  Please show the CHEMOTHERAPY ALERT CARD or IMMUNOTHERAPY ALERT CARD at check-in to  the Emergency Department and triage nurse.  Should you have questions after your visit or need to cancel or reschedule your appointment, please contact [facility]  [phone] and follow the prompts.  Office hours are 8:00 a.m. to 4:30 p.m. Monday - Friday. Please note that voicemails left after 4:00 p.m. may not be returned until the following business day.  We are closed weekends and major holidays. You have access to a nurse at all times for urgent questions. Please call the main number to the clinic [phone] and follow the prompts.  For any non-urgent questions, you may also contact your provider using MyChart. We now offer e-Visits for anyone 68 and older to request care online for non-urgent symptoms. For details visit [email or web address].   Also download the MyChart app! Go to the app store, search "MyChart", open the app, select [facility], and log in with your MyChart username and password.  Due to Covid, a mask is required upon entering the hospital/clinic. If you do not have a mask, one will be given to you upon arrival. For doctor visits, patients may have 1 support person aged 26 or older with them. For treatment visits, patients cannot have anyone with them due to current Covid guidelines and our immunocompromised population.

## 2020-11-15 LAB — PROTEIN ELECTROPHORESIS, SERUM
A/G Ratio: 1.2 (ref 0.7–1.7)
Albumin ELP: 3.7 g/dL (ref 2.9–4.4)
Alpha-1-Globulin: 0.2 g/dL (ref 0.0–0.4)
Alpha-2-Globulin: 0.8 g/dL (ref 0.4–1.0)
Beta Globulin: 1.7 g/dL — ABNORMAL HIGH (ref 0.7–1.3)
Gamma Globulin: 0.5 g/dL (ref 0.4–1.8)
Globulin, Total: 3.2 g/dL (ref 2.2–3.9)
M-Spike, %: 0.7 g/dL — ABNORMAL HIGH
Total Protein ELP: 6.9 g/dL (ref 6.0–8.5)

## 2020-11-15 LAB — IGG, IGA, IGM
IgA: 1375 mg/dL — ABNORMAL HIGH (ref 64–422)
IgG (Immunoglobin G), Serum: 180 mg/dL — ABNORMAL LOW (ref 586–1602)
IgM (Immunoglobulin M), Srm: <5 mg/dL — ABNORMAL LOW (ref 26–217)

## 2020-11-15 LAB — KAPPA/LAMBDA LIGHT CHAINS
Kappa free light chain: 6.4 mg/L (ref 3.3–19.4)
Kappa, lambda light chain ratio: 0.49 (ref 0.26–1.65)
Lambda free light chains: 13 mg/L (ref 5.7–26.3)

## 2020-11-17 ENCOUNTER — Encounter: Payer: Self-pay | Admitting: Emergency Medicine

## 2020-11-17 ENCOUNTER — Inpatient Hospital Stay: Payer: Medicare Other

## 2020-11-17 ENCOUNTER — Ambulatory Visit
Admission: EM | Admit: 2020-11-17 | Discharge: 2020-11-17 | Disposition: A | Discharge status: Home / Self Care | Payer: Medicare Other | Attending: Physician Assistant | Admitting: Physician Assistant

## 2020-11-17 ENCOUNTER — Other Ambulatory Visit: Payer: Self-pay

## 2020-11-17 VITALS — BP 105/62 | HR 82 | Temp 96.0°F | Resp 18 | Wt 161.0 lb

## 2020-11-17 DIAGNOSIS — S61411D Laceration without foreign body of right hand, subsequent encounter: Secondary | ICD-10-CM | POA: Diagnosis not present

## 2020-11-17 DIAGNOSIS — Z5111 Encounter for antineoplastic chemotherapy: Secondary | ICD-10-CM | POA: Diagnosis not present

## 2020-11-17 DIAGNOSIS — C9000 Multiple myeloma not having achieved remission: Secondary | ICD-10-CM

## 2020-11-17 MED ORDER — BORTEZOMIB CHEMO SQ INJECTION 3.5 MG (2.5MG/ML)
1.3000 mg/m2 | Freq: Once | INTRAMUSCULAR | Status: AC
  Administered 2020-11-17: 2.5 mg via SUBCUTANEOUS
  Filled 2020-11-17: qty 1

## 2020-11-17 MED ORDER — DEXAMETHASONE 4 MG PO TABS
20.0000 mg | ORAL_TABLET | Freq: Once | ORAL | Status: AC
  Administered 2020-11-17: 20 mg via ORAL
  Filled 2020-11-17: qty 5

## 2020-11-17 MED ORDER — MUPIROCIN 2 % EX OINT: 1.0000 "application " | TOPICAL_OINTMENT | Freq: Two times a day (BID) | CUTANEOUS | 0 refills | Status: AC

## 2020-11-17 NOTE — Patient Instructions (Signed)
[facility]   Discharge Instructions: Thank you for choosing [facility] to provide your oncology and hematology care.  If you have a lab appointment with the [facility], please go directly to the [facility] and check in at the registration area.  Wear comfortable clothing and clothing appropriate for easy access to any Portacath or PICC line.   We strive to give you quality time with your provider. You may need to reschedule your appointment if you arrive late (15 or more minutes).  Arriving late affects you and other patients whose appointments are after yours.  Also, if you miss three or more appointments without notifying the office, you may be dismissed from the clinic at the provider's discretion.      For prescription refill requests, have your pharmacy contact our office and allow 72 hours for refills to be completed.    Today you received the following chemotherapy and/or immunotherapy agents - Velcade To help prevent nausea and vomiting after your treatment, we encourage you to take your nausea medication as directed.  BELOW ARE SYMPTOMS THAT SHOULD BE REPORTED IMMEDIATELY: *FEVER GREATER THAN 100.4 F (38 C) OR HIGHER *CHILLS OR SWEATING *NAUSEA AND VOMITING THAT IS NOT CONTROLLED WITH YOUR NAUSEA MEDICATION *UNUSUAL SHORTNESS OF BREATH *UNUSUAL BRUISING OR BLEEDING *URINARY PROBLEMS (pain or burning when urinating, or frequent urination) *BOWEL PROBLEMS (unusual diarrhea, constipation, pain near the anus) TENDERNESS IN MOUTH AND THROAT WITH OR WITHOUT PRESENCE OF ULCERS (sore throat, sores in mouth, or a toothache) UNUSUAL RASH, SWELLING OR PAIN  UNUSUAL VAGINAL DISCHARGE OR ITCHING   Items with * indicate a potential emergency and should be followed up as soon as possible or go to the Emergency Department if any problems should occur.  Please show the CHEMOTHERAPY ALERT CARD or IMMUNOTHERAPY ALERT CARD at check-in to  the Emergency Department and triage nurse.  Should you have questions after your visit or need to cancel or reschedule your appointment, please contact [facility]  [phone] and follow the prompts.  Office hours are 8:00 a.m. to 4:30 p.m. Monday - Friday. Please note that voicemails left after 4:00 p.m. may not be returned until the following business day.  We are closed weekends and major holidays. You have access to a nurse at all times for urgent questions. Please call the main number to the clinic [phone] and follow the prompts.  For any non-urgent questions, you may also contact your provider using MyChart. We now offer e-Visits for anyone 62 and older to request care online for non-urgent symptoms. For details visit [email or web address].   Also download the MyChart app! Go to the app store, search "MyChart", open the app, select [facility], and log in with your MyChart username and password.  Due to Covid, a mask is required upon entering the hospital/clinic. If you do not have a mask, one will be given to you upon arrival. For doctor visits, patients may have 1 support person aged 80 or older with them. For treatment visits, patients cannot have anyone with them due to current Covid guidelines and our immunocompromised population.   Dexamethasone tablets What is this medication? DEXAMETHASONE (dex a METH a sone) is a corticosteroid. It is commonly used to treat inflammation of the skin, joints, lungs, and other organs. Common conditions treated include asthma, allergies, and arthritis. It is also used for other conditions, such as blood disorders and diseases of the adrenalglands. This medicine may be  used for other purposes; ask your health care provider orpharmacist if you have questions. COMMON BRAND NAME(S): CUSHINGS [employer]OME DI[employer]NOSTIC, Decadron, Dexabliss, DexPak Jr TaperPak, DexPak TaperPak, Dxevo, Hemady, HiDex, TaperDex, ZCORT,  Zema-Pak,ZoDex, ZonaCort 11 Day, ZonaCort 7 Day What should I tell my care team before I take this medication? They need to know if you have any of these conditions: Cushing's syndrome diabetes glaucoma heart disease high blood pressure infection like herpes, measles, tuberculosis, or chickenpox kidney disease liver disease mental illness myasthenia gravis osteoporosis previous heart attack seizures stomach or intestine problems thyroid disease an unusual or allergic reaction to dexamethasone, corticosteroids, other medicines, lactose, foods, dyes, or preservatives pregnant or trying to get pregnant breast-feeding How should I use this medication? Take this medicine by mouth with a drink of water. Follow the directions on the prescription label. Take it with food or milk to avoid stomach upset. If you are taking this medicine once a day, take it in the morning. Do not take more medicine than you are told to take. Do not suddenly stop taking your medicine because you may develop a severe reaction. Your doctor will tell you how much medicine to take. If your doctor wants you to stop the medicine, the dose maybe slowly lowered over time to avoid any side effects. Talk to your pediatrician regarding the use of this medicine in children.Special care may be needed. Patients over 106 years old may have a stronger reaction and need a smaller dose. Overdosage: If you think you have taken too much of this medicine contact apoison control center or emergency room at once. NOTE: This medicine is only for you. Do not share this medicine with others. What if I miss a dose? If you miss a dose, take it as soon as you can. If it is almost time for your next dose, talk to your doctor or health care professional. You may need to miss a dose or take an extra dose. Do not take double or extra doses withoutadvice. What may interact with this medication? Do not take this medicine with any of the following  medications: live virus vaccines This medicine may also interact with the following medications: aminoglutethimide amphotericin B aspirin and aspirin-like medicines certain antibiotics like erythromycin, clarithromycin, and troleandomycin certain antivirals for HIV or hepatitis certain medicines for seizures like carbamazepine, phenobarbital, phenytoin certain medicines to treat myasthenia gravis cholestyramine cyclosporine digoxin diuretics ephedrine female hormones, like estrogen or progestins and birth control pills insulin or other medicines for diabetes isoniazid ketoconazole medicines that relax muscles for surgery mifepristone NSAIDs, medicines for pain and inflammation, like ibuprofen or naproxen rifampin skin tests for allergies thalidomide vaccines warfarin This list may not describe all possible interactions. Give your health care provider a list of all the medicines, herbs, non-prescription drugs, or dietary supplements you use. Also tell them if you smoke, drink alcohol, or use illegaldrugs. Some items may interact with your medicine. What should I watch for while using this medication? Visit your health care professional for regular checks on your progress. Tell your health care professional if your symptoms do not start to get better or if they get worse. Your condition will be monitored carefully while you arereceiving this medicine. Wear a medical ID bracelet or chain. Carry a card that describes your diseaseand details of your medicine and dosage times. This medicine may increase your risk of getting an infection. Call your health care professional for advice if you get a fever, chills, or sore throat, or other  symptoms of a cold or flu. Do not treat yourself. Try to avoid being around people who are sick. Call your health care professional if you are around anyone with measles, chickenpox, or if you develop sores or blistersthat do not heal properly. If you are  going to need surgery or other procedures, tell your doctor or health care professional that you have taken this medicine within the last 66month. Ask your doctor or health care professional about your diet. You may need tolower the amount of salt you eat. This medicine may increase blood sugar. Ask your healthcare provider if changesin diet or medicines are needed if you have diabetes. What side effects may I notice from receiving this medication? Side effects that you should report to your doctor or health care professionalas soon as possible: allergic reactions like skin rash, itching or hives, swelling of the face, lips, or tongue bloody or black, tarry stools changes in emotions or moods changes in vision confusion, excitement, restlessness depressed mood eye pain hallucinations fever or chills, cough, sore throat, pain or difficulty passing urine muscle weakness severe or sudden stomach or belly pain signs and symptoms of high blood sugar such as being more thirsty or hungry or having to urinate more than normal. You may also feel very tired or have blurry vision. signs and symptoms of infection like fever; chills; cough; sore throat; pain or trouble passing urine swelling of ankles, feet unusual bruising or bleeding wounds that do not heal Side effects that usually do not require medical attention (report to yourdoctor or health care professional if they continue or are bothersome): increased appetite increased growth of face or body hair headache nausea, vomiting skin problems, acne, thin and shiny skin trouble sleeping weight gain This list may not describe all possible side effects. Call your doctor for medical advice about side effects. You may report side effects to FDA at1-800-FDA-1088. Where should I keep my medication? Keep out of the reach of children. Store at room temperature between 20 and 25 degrees C (68 and 77 degrees F).Protect from light. Throw away any unused  medicine after the expiration date. NOTE: This sheet is a summary. It may not cover all possible information. If you have questions about this medicine, talk to your doctor, pharmacist, orhealth care provider.  2022 Elsevier/Gold Standard (2018-11-10 14:23:34)  Bortezomib injection What is this medication? BORTEZOMIB (bor TEZ oh mib) targets proteins in cancer cells and stops thecancer cells from growing. It treats multiple myeloma and mantle cell lymphoma. This medicine may be used for other purposes; ask your health care provider orpharmacist if you have questions. COMMON BRAND NAME(S): Velcade What should I tell my care team before I take this medication? They need to know if you have any of these conditions: dehydration diabetes (high blood sugar) heart disease liver disease tingling of the fingers or toes or other nerve disorder an unusual or allergic reaction to bortezomib, mannitol, boron, other medicines, foods, dyes, or preservatives pregnant or trying to get pregnant breast-feeding How should I use this medication? This medicine is injected into a vein or under the skin. It is given by ahealth care provider in a hospital or clinic setting. Talk to your health care provider about the use of this medicine in children.Special care may be needed. Overdosage: If you think you have taken too much of this medicine contact apoison control center or emergency room at once. NOTE: This medicine is only for you. Do not share this medicine with others. What  if I miss a dose? Keep appointments for follow-up doses. It is important not to miss your dose.Call your health care provider if you are unable to keep an appointment. What may interact with this medication? This medicine may interact with the following medications: ketoconazole rifampin This list may not describe all possible interactions. Give your health care provider a list of all the medicines, herbs, non-prescription drugs, or  dietary supplements you use. Also tell them if you smoke, drink alcohol, or use illegaldrugs. Some items may interact with your medicine. What should I watch for while using this medication? Your condition will be monitored carefully while you are receiving thismedicine. You may need blood work done while you are taking this medicine. You may get drowsy or dizzy. Do not drive, use machinery, or do anything that needs mental alertness until you know how this medicine affects you. Do not stand up or sit up quickly, especially if you are an older patient. Thisreduces the risk of dizzy or fainting spells This medicine may increase your risk of getting an infection. Call your health care provider for advice if you get a fever, chills, sore throat, or other symptoms of a cold or flu. Do not treat yourself. Try to avoid being aroundpeople who are sick. Check with your health care provider if you have severe diarrhea, nausea, and vomiting, or if you sweat a lot. The loss of too much body fluid may make itdangerous for you to take this medicine. Do not become pregnant while taking this medicine or for 7 months after stopping it. Women should inform their health care provider if they wish to become pregnant or think they might be pregnant. Men should not father a child while taking this medicine and for 4 months after stopping it. There is a potential for serious harm to an unborn child. Talk to your health care provider for more information. Do not breast-feed an infant while taking thismedicine or for 2 months after stopping it. This medicine may make it more difficult to get pregnant or father a child.Talk to your health care provider if you are concerned about your fertility. What side effects may I notice from receiving this medication? Side effects that you should report to your doctor or health care professionalas soon as possible: allergic reactions (skin rash; itching or hives; swelling of the face, lips,  or tongue) bleeding (bloody or black, tarry stools; red or dark brown urine; spitting up blood or brown material that looks like coffee grounds; red spots on the skin; unusual bruising or bleeding from the eye, gums, or nose) blurred vision or changes in vision confusion constipation headache heart failure (trouble breathing; fast, irregular heartbeat; sudden weight gain; swelling of the ankles, feet, hands) infection (fever, chills, cough, sore throat, pain or trouble passing urine) lack or loss of appetite liver injury (dark yellow or brown urine; general ill feeling or flu-like symptoms; loss of appetite, right upper belly pain; yellowing of the eyes or skin) low blood pressure (dizziness; feeling faint or lightheaded, falls; unusually weak or tired) muscle cramps pain, redness, or irritation at site where injected pain, tingling, numbness in the hands or feet seizures trouble breathing unusual bruising or bleeding Side effects that usually do not require medical attention (report to yourdoctor or health care professional if they continue or are bothersome): diarrhea nausea stomach pain trouble sleeping vomiting This list may not describe all possible side effects. Call your doctor for medical advice about side effects. You may report side  effects to FDA at1-800-FDA-1088. Where should I keep my medication? This medicine is given in a hospital or clinic. It will not be stored at home. NOTE: This sheet is a summary. It may not cover all possible information. If you have questions about this medicine, talk to your doctor, pharmacist, orhealth care provider.  2022 Elsevier/Gold Standard (2020-04-20 13:22:53)

## 2020-11-17 NOTE — Discharge Instructions (Addendum)
Since part of the tissue is black, there is concern that the tissue might be dead.  I placed a referral to wound care.  If you have not heard anything from them by Tuesday, please contact the number on this patient.  The area clean and dry and apply antibiotic ointment as prescribed.  If you have any increasing pain, develop fever or have swelling or redness, go to ED.

## 2020-11-17 NOTE — Progress Notes (Signed)
Pt received velcade injection in clinic today. Tolerated well. No complaints at d/c.

## 2020-11-17 NOTE — ED Triage Notes (Signed)
Patient here to have sutures removed from her right hand.  Patient reports some soreness in her right hand

## 2020-11-17 NOTE — ED Provider Notes (Signed)
[facility]    CSN: [id number] Arrival date & time: 11/17/20  1419      History   Chief Complaint Chief Complaint  Patient presents with   Suture / Staple Removal    HPI Heather Peterson is a 80 y.o. female returning to the urgent care to have sutures removed from the right palm.  Patient states that her back gave out 10 days ago and she fell and caught herself on her walker.  States that she suffered a laceration of her hand as well as multiple other skin tears.  Patient advised to return to [facility] urgent care in 10 days to have sutures taken out.  Patient states that the area has had some darkness around the skin flap.  She says that she honestly believes it is looks like that the whole time but is not positive.  It is slightly tender.  She denies any fever or numbness.  Patient does have significant past medical history of CHF, CKD, multiple myeloma, type 2 diabetes, hypertension, multiple MIs, pacemaker, and thyroid disease.  No other concerns.  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[place])    Prolonged Q-T interval on ECG    Thyroid disease     Patient Active Problem List   Diagnosis Date Noted   Chemotherapy-induced peripheral neuropathy ([place]) 10/06/2020   Hospital discharge follow-up 08/31/2020   Difficulty sleeping 07/25/2020   Difficulty walking 07/25/2020   H/O subarachnoid hemorrhage 07/25/2020   Moderate protein-calorie malnutrition ([place]) 07/04/2020   Hyperkalemia 06/19/2020   Chronic respiratory failure ([place]) 06/19/2020   Hypokalemia    Fall    Troponin I above reference range    Status post hip  hemiarthroplasty    Closed nondisplaced fracture of greater trochanter of right femur ([place]) 03/30/2020   Closed displaced midcervical fracture of right femur ([place]) 03/30/2020   Hip fracture ([place]) 03/28/2020   Multiple myeloma ([place]) 03/01/2020   Goals of care, counseling/discussion 03/01/2020   Osteomyelitis ([place]) 01/20/2020   Gout 10/27/2019   Acute blood loss anemia    Gastrointestinal hemorrhage    UGI bleed 09/04/2019   Drug-induced constipation 07/26/2019   Insomnia due to medical condition 07/26/2019   Benign hypertensive kidney disease with chronic kidney disease 07/20/2019   Proteinuria 07/20/2019   Hyposmolality and/or hyponatremia 07/20/2019   Dehydration    Anemia in chronic kidney disease (CKD) 07/04/2019   Stage 3b chronic kidney disease ([place]) 07/04/2019   Chronic respiratory failure with hypoxia ([place]) 07/03/2019   Hyponatremia 07/03/2019   Prolonged Q-T interval on ECG    Weakness    Anemia    MDD (major depressive disorder), recurrent episode, 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   Tobacco use disorder 12/14/2018   At risk for prolonged QT interval syndrome 12/14/2018   Hypothyroidism, acquired, 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   Pedal edema 02/19/2018   SOB (shortness of breath) on exertion 02/19/2018   Left main coronary artery disease 03/07/2017   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[place]) 03/06/2017   Ventricular tachycardia ([place]) 03/05/2017   Defibrillator discharge 03/04/2017  CHF (congestive heart failure), NYHA class III, chronic, combined ([place]) 03/04/2017   COPD (chronic obstructive pulmonary disease) ([place]) 02/10/2017   Diabetes mellitus type 2, uncomplicated ([place]) 66/10/3014   H/O ventricular fibrillation 02/10/2017   Hyperlipidemia 02/10/2017   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   Ischemic cardiomyopathy 02/10/2017   Myocardial infarction ([place]) 02/10/2017   Moderate mitral regurgitation 02/10/2017   Syncope 02/04/2017   CAD (coronary artery  disease) 02/04/2017   HTN (hypertension) 02/04/2017   Type 2 diabetes mellitus with stage 3 chronic kidney disease ([place]) 02/04/2017    Past Surgical History:  Procedure Laterality Date   CENTRAL LINE INSERTION  03/11/2017   Procedure: CENTRAL LINE INSERTION;  Surgeon: Leonie Man, MD;  Location: [place] [facility];  Service: Cardiovascular;;   CHOLECYSTECTOMY     COLONOSCOPY WITH PROPOFOL N/A 09/01/2019   Procedure: COLONOSCOPY WITH PROPOFOL;  Surgeon: Toledo, Benay Pike, MD;  Location: [facility];  Service: Gastroenterology;  Laterality: N/A;   CORONARY STENT INTERVENTION W/IMPELLA N/A 03/11/2017   Procedure: Coronary Stent Intervention w/Impella;  Surgeon: Leonie Man, MD;  Location: [facility] [facility];  Service: Cardiovascular;  Laterality: N/A;   ESOPHAGOGASTRODUODENOSCOPY (EGD) WITH PROPOFOL N/A 09/01/2019   Procedure: ESOPHAGOGASTRODUODENOSCOPY (EGD) WITH PROPOFOL;  Surgeon: Toledo, Benay Pike, MD;  Location: [facility];  Service: Gastroenterology;  Laterality: N/A;   ESOPHAGOGASTRODUODENOSCOPY (EGD) WITH PROPOFOL N/A 09/08/2019   Procedure: ESOPHAGOGASTRODUODENOSCOPY (EGD) WITH PROPOFOL;  Surgeon: [name], MD;  Location: [facility];  Service: Gastroenterology;  Laterality: N/A;   EYE SURGERY     HIP ARTHROPLASTY Right 03/29/2020   Procedure: ARTHROPLASTY BIPOLAR HIP (HEMIARTHROPLASTY);  Surgeon: Corky Mull, MD;  Location: [facility];  Service: Orthopedics;  Laterality: Right;   INTRAVASCULAR PRESSURE WIRE/FFR STUDY N/A 03/11/2017   Procedure: INTRAVASCULAR PRESSURE WIRE/FFR STUDY;  Surgeon: Leonie Man, MD;  Location: [place] [facility];  Service: Cardiovascular;  Laterality: N/A;   INTRAVASCULAR ULTRASOUND/IVUS N/A 03/11/2017   Procedure: Intravascular Ultrasound/IVUS;  Surgeon: Leonie Man, MD;  Location: [place] [facility];  Service: Cardiovascular;  Laterality: N/A;   LEFT HEART CATH AND CORONARY ANGIOGRAPHY N/A 03/05/2017   Procedure: LEFT HEART  CATH AND CORONARY ANGIOGRAPHY;  Surgeon: Isaias Cowman, MD;  Location: [place] [facility];  Service: Cardiovascular;  Laterality: N/A;   PACEMAKER IMPLANT     pacemaker/defibrillator Left     OB History   No obstetric history on file.      Home Medications    Prior to Admission medications   Medication Sig Start Date End Date Taking? Authorizing Provider  mupirocin ointment (BACTROBAN) 2 % Apply 1 application topically 2 (two) times daily for 7 days. 11/17/20 11/24/20 Yes Danton Clap, PA-C  allopurinol (ZYLOPRIM) 100 MG tablet Take 50 mg by mouth daily.    [provider]  aspirin EC 81 MG tablet Take 81 mg by mouth at bedtime.     [provider]  dexamethasone (DECADRON) 4 MG tablet Take 5 tablets (20 mg total) by mouth daily. Take the day after Velcade on days 2,5,9,12. Take with breakfast 11/14/20   Lloyd Huger, MD  docusate sodium (COLACE) 100 MG capsule Take 100 mg by mouth 2 (two) times daily.    [provider]  Ensure Max Protein (ENSURE MAX PROTEIN) LIQD Take 330 mLs (11 oz total) by mouth 2 (two) times daily between meals. Patient not taking: Reported on 10/24/2020 04/03/20   [name], MD  fexofenadine (ALLEGRA) 180 MG tablet  Take 180 mg by mouth daily.    [provider]  FLUoxetine (PROZAC) 10 MG capsule Take 10 mg by mouth daily. 06/29/19   [provider]  gabapentin (NEURONTIN) 300 MG capsule Take 361m in AM, 3013min afternoon, 9005mt night 10/06/20   Vaslow, ZacAcey LavD  gabapentin (NEURONTIN) 600 MG tablet Take 600 mg by mouth daily. 11/07/20   [provider]  HYDROcodone-acetaminophen (NORCO/VICODIN) 5-325 MG tablet SMARTSIG:1 Tablet(s) By Mouth Every 12 Hours PRN 10/24/20   FinLloyd HugerD  hydrocortisone (ANUSOL-HC) 2.5 % rectal cream Apply 1 application topically 2 (two) times daily. Patient not taking: Reported on 10/24/2020 02/12/20   [provider]  Lactulose 20 GM/30ML  SOLN Take 30 mLs (20 g total) by mouth in the morning and at bedtime. Patient not taking: Reported on 10/24/2020 06/23/20   BurJacquelin HawkingP  levothyroxine (SYNTHROID) 50 MCG tablet Take by mouth. 08/14/20   [provider]  levothyroxine (SYNTHROID) 75 MCG tablet Take 1 tablet (75 mcg total) by mouth daily before breakfast. On Monday, Wednesday and Friday 04/03/20   AmiLorella NimrodD  magnesium oxide (MAG-OX) 400 MG tablet Take 200 mg by mouth daily.    [provider]  mexiletine (MEXITIL) 200 MG capsule Take 1 capsule (200 mg total) by mouth every 12 (twelve) hours. 03/14/17   SeiPatsey BertholdP  midodrine (PROAMATINE) 10 MG tablet Take 1 tablet (10 mg total) by mouth 2 (two) times daily with a meal. 04/03/20   AmiLorella NimrodD  ondansetron (ZOFRAN) 8 MG tablet Take by mouth every 8 (eight) hours as needed for nausea or vomiting.    [provider]  OXYGEN Inhale 2 L into the lungs continuous.    [provider]  pantoprazole (PROTONIX) 40 MG tablet Take 40 mg by mouth daily.  05/16/19   [provider]  polyethylene glycol (MIRALAX) 17 g packet Take 17 g by mouth daily. Patient not taking: Reported on 11/14/2020 06/18/20   GooNance PearD  Prenatal Vit-Fe Fumarate-FA (PRENATAL MULTIVITAMIN) TABS tablet Take 1 tablet by mouth daily.    [provider]  simvastatin (ZOCOR) 40 MG tablet Take 20 mg by mouth at bedtime.     [provider]  traZODone (DESYREL) 50 MG tablet Take 50 mg by mouth at bedtime.    [provider]    Family History Family History  Problem Relation Age of Onset   Hypertension Father    Heart attack Father    Depression Sister    Depression Brother    Depression Brother     Social History Social History   Tobacco Use   Smoking status: Every Day    Packs/day: 0.25    Pack years: 0.00    Types: E-cigarettes, Cigarettes   Smokeless tobacco: Never  Vaping Use   Vaping Use: Former   Substance Use Topics   Alcohol use: Not Currently    Comment: occasionally   Drug use: Yes    Comment: prescribed pain meds     Allergies   Celebrex [celecoxib], Glipizide, Levaquin [levofloxacin in d5w], Levofloxacin, Lisinopril, Sulfa antibiotics, Metformin, and Penicillins   Review of Systems Review of Systems  Constitutional:  Negative for chills and fever.  Skin:  Positive for color change and wound.  Neurological:  Negative for weakness and numbness.    Physical Exam Triage Vital Signs ED Triage Vitals  Enc Vitals Group     BP 11/17/20 1530 119/72  Pulse Rate 11/17/20 1530 97     Resp 11/17/20 1530 14     Temp 11/17/20 1530 97.7 F (36.5 C)     Temp Source 11/17/20 1530 Oral     SpO2 11/17/20 1530 97 %     Weight 11/17/20 1528 160 lb 15 oz (73 kg)     Height 11/17/20 1528 5' 3" (1.6 m)     Head Circumference --      Peak Flow --      Pain Score 11/17/20 1528 2     Pain Loc --      Pain Edu? --      Excl. in [place]? --    No data found.  Updated Vital Signs BP 119/72 (BP Location: Left Arm)   Pulse 97   Temp 97.7 F (36.5 C) (Oral)   Resp 14   Ht 5' 3" (1.6 m)   Wt 160 lb 15 oz (73 kg)   SpO2 97%   BMI 28.51 kg/m       Physical Exam Vitals and nursing note reviewed.  Constitutional:      General: She is not in acute distress.    Appearance: Normal appearance. She is not ill-appearing or toxic-appearing.  HENT:     Head: Normocephalic and atraumatic.  Eyes:     General: No scleral icterus.       Right eye: No discharge.        Left eye: No discharge.     Conjunctiva/sclera: Conjunctivae normal.  Cardiovascular:     Rate and Rhythm: Normal rate and regular rhythm.     Pulses: Normal pulses.  Pulmonary:     Effort: Pulmonary effort is normal. No respiratory distress.  Musculoskeletal:     Cervical back: Neck supple.  Skin:    General: Skin is dry.     Comments: See image below.  There is a curved laceration with sutures in place of the  right palm.  There is dark blackish tissue at the edge of the flap.  She does not seem to have any feeling in this area but has some tenderness around it.  Neurological:     General: No focal deficit present.     Mental Status: She is alert. Mental status is at baseline.     Motor: No weakness.     Gait: Gait normal.  Psychiatric:        Mood and Affect: Mood normal.        Behavior: Behavior normal.        Thought Content: Thought content normal.      UC Treatments / Results  Labs (all labs ordered are listed, but only abnormal results are displayed) Labs Reviewed - No data to display  EKG   Radiology No results found.  Procedures Procedures (including critical care time)  Removed 12 sutures from right palm.  Medications Ordered in UC Medications - No data to display  Initial Impression / Assessment and Plan / UC Course  I have reviewed the triage vital signs and the nursing notes.  Pertinent labs & imaging results that were available during my care of the patient were reviewed by me and considered in my medical decision making (see chart for details).  80 year old female presenting for suture removal.  Patient had laceration of the right palm 10 days ago.  On exam, there is concern for possible necrotic tissue of the skin flap.  I have removed the sutures.  I want patient to follow-up with  wound care to see if she needs to have wound revision.  At this time, advised on local wound care.  Sent in mupirocin.  Reviewed ED precautions with patient and advised her to go for any signs of infection such as increased redness, swelling, pain or fever.  Advised patient she needs to be seen next week and if she cannot be seen by wound care that she needs to go to the emergency department or her PCP.   Final Clinical Impressions(s) / UC Diagnoses   Final diagnoses:  Laceration of right hand without foreign body, subsequent encounter     Discharge Instructions      Since part  of the tissue is black, there is concern that the tissue might be dead.  I placed a referral to wound care.  If you have not heard anything from them by Tuesday, please contact the number on this patient.  The area clean and dry and apply antibiotic ointment as prescribed.  If you have any increasing pain, develop fever or have swelling or redness, go to ED.     ED Prescriptions     Medication Sig Dispense Auth. Provider   mupirocin ointment (BACTROBAN) 2 % Apply 1 application topically 2 (two) times daily for 7 days. 22 g Danton Clap, PA-C      PDMP not reviewed this encounter.   Danton Clap, PA-C 11/17/20 1742

## 2020-11-20 ENCOUNTER — Telehealth: Payer: Self-pay | Admitting: *Deleted

## 2020-11-20 NOTE — Telephone Encounter (Signed)
Dr. Alba Destine office called to ask if your office had received a report regarding this patient for Dr. Azalia Bilis (sp) office number is [phone]

## 2020-11-20 NOTE — Telephone Encounter (Signed)
We have not received a fax from Dr. Alba Destine or Dr. Ellison Hughs office but there is documentation in [place] in regards to recent MRI (obtained at [facility]) on patient.  Returned a call to Dr. Alba Destine office to let them know we have not received any correspondence on this patient and she is scheudled to see Scheryl Darter,  NP tomorrow.

## 2020-11-21 ENCOUNTER — Other Ambulatory Visit: Payer: Self-pay

## 2020-11-21 ENCOUNTER — Inpatient Hospital Stay: Payer: Medicare Other

## 2020-11-21 ENCOUNTER — Inpatient Hospital Stay (HOSPITAL_BASED_OUTPATIENT_CLINIC_OR_DEPARTMENT_OTHER): Payer: Medicare Other | Admitting: Nurse Practitioner

## 2020-11-21 ENCOUNTER — Encounter: Payer: Self-pay | Admitting: Nurse Practitioner

## 2020-11-21 VITALS — BP 99/57 | HR 79 | Temp 97.5°F | Resp 18 | Wt 158.6 lb

## 2020-11-21 DIAGNOSIS — Z5111 Encounter for antineoplastic chemotherapy: Secondary | ICD-10-CM

## 2020-11-21 DIAGNOSIS — C9000 Multiple myeloma not having achieved remission: Secondary | ICD-10-CM | POA: Diagnosis not present

## 2020-11-21 DIAGNOSIS — G4700 Insomnia, unspecified: Secondary | ICD-10-CM | POA: Diagnosis not present

## 2020-11-21 LAB — COMPREHENSIVE METABOLIC PANEL
ALT: 18 U/L (ref 0–44)
AST: 19 U/L (ref 15–41)
Albumin: 3.8 g/dL (ref 3.5–5.0)
Alkaline Phosphatase: 95 U/L (ref 38–126)
Anion gap: 11 (ref 5–15)
BUN: 66 mg/dL — ABNORMAL HIGH (ref 8–23)
CO2: 30 mmol/L (ref 22–32)
Calcium: 9.1 mg/dL (ref 8.9–10.3)
Chloride: 95 mmol/L — ABNORMAL LOW (ref 98–111)
Creatinine, Ser: 1.47 mg/dL — ABNORMAL HIGH (ref 0.44–1.00)
GFR, Estimated: 36 mL/min — ABNORMAL LOW (ref >60)
Glucose, Bld: 125 mg/dL — ABNORMAL HIGH (ref 70–99)
Potassium: 4 mmol/L (ref 3.5–5.1)
Sodium: 136 mmol/L (ref 135–145)
Total Bilirubin: 0.2 mg/dL — ABNORMAL LOW (ref 0.3–1.2)
Total Protein: 6.8 g/dL (ref 6.5–8.1)

## 2020-11-21 LAB — CBC WITH DIFFERENTIAL/PLATELET
Abs Immature Granulocytes: 0.44 10*3/uL — ABNORMAL HIGH (ref 0.00–0.07)
Basophils Absolute: 0 10*3/uL (ref 0.0–0.1)
Basophils Relative: 0 %
Eosinophils Absolute: 0.2 10*3/uL (ref 0.0–0.5)
Eosinophils Relative: 2 %
HCT: 29.1 % — ABNORMAL LOW (ref 36.0–46.0)
Hemoglobin: 8.8 g/dL — ABNORMAL LOW (ref 12.0–15.0)
Immature Granulocytes: 4 %
Lymphocytes Relative: 25 %
Lymphs Abs: 2.6 10*3/uL (ref 0.7–4.0)
MCH: 27.9 pg (ref 26.0–34.0)
MCHC: 30.2 g/dL (ref 30.0–36.0)
MCV: 92.4 fL (ref 80.0–100.0)
Monocytes Absolute: 1.2 10*3/uL — ABNORMAL HIGH (ref 0.1–1.0)
Monocytes Relative: 12 %
Neutro Abs: 6.1 10*3/uL (ref 1.7–7.7)
Neutrophils Relative %: 57 %
Platelets: 255 10*3/uL (ref 150–400)
RBC: 3.15 MIL/uL — ABNORMAL LOW (ref 3.87–5.11)
RDW: 17.5 % — ABNORMAL HIGH (ref 11.5–15.5)
WBC: 10.6 10*3/uL — ABNORMAL HIGH (ref 4.0–10.5)
nRBC: 1 % — ABNORMAL HIGH (ref 0.0–0.2)

## 2020-11-21 LAB — SAMPLE TO BLOOD BANK

## 2020-11-21 MED ORDER — BORTEZOMIB CHEMO SQ INJECTION 3.5 MG (2.5MG/ML)
1.3000 mg/m2 | Freq: Once | INTRAMUSCULAR | Status: AC
  Administered 2020-11-21: 2.5 mg via SUBCUTANEOUS
  Filled 2020-11-21: qty 1

## 2020-11-21 MED ORDER — DEXAMETHASONE 4 MG PO TABS
20.0000 mg | ORAL_TABLET | Freq: Once | ORAL | Status: AC
Start: 2020-11-21 — End: 2020-11-21
  Administered 2020-11-21: 20 mg via ORAL

## 2020-11-21 NOTE — Progress Notes (Signed)
Labs reviewed with Ander Purpura NP and treatment team. Per NP to continue with treatment today. VSS.  Sawsan Riggio [employer]

## 2020-11-21 NOTE — Patient Instructions (Signed)
[facility]  Discharge Instructions: Thank you for choosing [facility] to provide your oncology and hematology care.  If you have a lab appointment with the [facility], please go directly to the [facility] and check in at the registration area.  Wear comfortable clothing and clothing appropriate for easy access to any Portacath or PICC line.   We strive to give you quality time with your provider. You may need to reschedule your appointment if you arrive late (15 or more minutes).  Arriving late affects you and other patients whose appointments are after yours.  Also, if you miss three or more appointments without notifying the office, you may be dismissed from the clinic at the provider's discretion.      For prescription refill requests, have your pharmacy contact our office and allow 72 hours for refills to be completed.    Today you received the following chemotherapy and/or immunotherapy agents Velcade and DexamethasoneBortezomib injection What is this medication? BORTEZOMIB (bor TEZ oh mib) targets proteins in cancer cells and stops thecancer cells from growing. It treats multiple myeloma and mantle cell lymphoma. This medicine may be used for other purposes; ask your health care provider orpharmacist if you have questions. COMMON BRAND NAME(S): Velcade What should I tell my care team before I take this medication? They need to know if you have any of these conditions: dehydration diabetes (high blood sugar) heart disease liver disease tingling of the fingers or toes or other nerve disorder an unusual or allergic reaction to bortezomib, mannitol, boron, other medicines, foods, dyes, or preservatives pregnant or trying to get pregnant breast-feeding How should I use this medication? This medicine is injected into a vein or under the skin. It is given by ahealth care provider in a hospital or clinic setting. Talk to your health  care provider about the use of this medicine in children.Special care may be needed. Overdosage: If you think you have taken too much of this medicine contact apoison control center or emergency room at once. NOTE: This medicine is only for you. Do not share this medicine with others. What if I miss a dose? Keep appointments for follow-up doses. It is important not to miss your dose.Call your health care provider if you are unable to keep an appointment. What may interact with this medication? This medicine may interact with the following medications: ketoconazole rifampin This list may not describe all possible interactions. Give your health care provider a list of all the medicines, herbs, non-prescription drugs, or dietary supplements you use. Also tell them if you smoke, drink alcohol, or use illegaldrugs. Some items may interact with your medicine. What should I watch for while using this medication? Your condition will be monitored carefully while you are receiving thismedicine. You may need blood work done while you are taking this medicine. You may get drowsy or dizzy. Do not drive, use machinery, or do anything that needs mental alertness until you know how this medicine affects you. Do not stand up or sit up quickly, especially if you are an older patient. Thisreduces the risk of dizzy or fainting spells This medicine may increase your risk of getting an infection. Call your health care provider for advice if you get a fever, chills, sore throat, or other symptoms of a cold or flu. Do not treat yourself. Try to avoid being aroundpeople who are sick. Check with your health care provider if you have severe diarrhea, nausea, and vomiting, or  if you sweat a lot. The loss of too much body fluid may make itdangerous for you to take this medicine. Do not become pregnant while taking this medicine or for 7 months after stopping it. Women should inform their health care provider if they wish to  become pregnant or think they might be pregnant. Men should not father a child while taking this medicine and for 4 months after stopping it. There is a potential for serious harm to an unborn child. Talk to your health care provider for more information. Do not breast-feed an infant while taking thismedicine or for 2 months after stopping it. This medicine may make it more difficult to get pregnant or father a child.Talk to your health care provider if you are concerned about your fertility. What side effects may I notice from receiving this medication? Side effects that you should report to your doctor or health care professionalas soon as possible: allergic reactions (skin rash; itching or hives; swelling of the face, lips, or tongue) bleeding (bloody or black, tarry stools; red or dark brown urine; spitting up blood or brown material that looks like coffee grounds; red spots on the skin; unusual bruising or bleeding from the eye, gums, or nose) blurred vision or changes in vision confusion constipation headache heart failure (trouble breathing; fast, irregular heartbeat; sudden weight gain; swelling of the ankles, feet, hands) infection (fever, chills, cough, sore throat, pain or trouble passing urine) lack or loss of appetite liver injury (dark yellow or brown urine; general ill feeling or flu-like symptoms; loss of appetite, right upper belly pain; yellowing of the eyes or skin) low blood pressure (dizziness; feeling faint or lightheaded, falls; unusually weak or tired) muscle cramps pain, redness, or irritation at site where injected pain, tingling, numbness in the hands or feet seizures trouble breathing unusual bruising or bleeding Side effects that usually do not require medical attention (report to yourdoctor or health care professional if they continue or are bothersome): diarrhea nausea stomach pain trouble sleeping vomiting This list may not describe all possible side  effects. Call your doctor for medical advice about side effects. You may report side effects to FDA at1-800-FDA-1088. Where should I keep my medication? This medicine is given in a hospital or clinic. It will not be stored at home. NOTE: This sheet is a summary. It may not cover all possible information. If you have questions about this medicine, talk to your doctor, pharmacist, orhealth care provider.  2022 Elsevier/Gold Standard (2020-04-20 13:22:53) Dexamethasone tablets What is this medication? DEXAMETHASONE (dex a METH a sone) is a corticosteroid. It is commonly used to treat inflammation of the skin, joints, lungs, and other organs. Common conditions treated include asthma, allergies, and arthritis. It is also used for other conditions, such as blood disorders and diseases of the adrenalglands. This medicine may be used for other purposes; ask your health care provider orpharmacist if you have questions. COMMON BRAND NAME(S): CUSHINGS SYNDROME DIAGNOSTIC, Decadron, Dexabliss, DexPak Jr TaperPak, DexPak TaperPak, Dxevo, Hemady, HiDex, TaperDex, ZCORT, Zema-Pak,ZoDex, ZonaCort 11 Day, ZonaCort 7 Day What should I tell my care team before I take this medication? They need to know if you have any of these conditions: Cushing's syndrome diabetes glaucoma heart disease high blood pressure infection like herpes, measles, tuberculosis, or chickenpox kidney disease liver disease mental illness myasthenia gravis osteoporosis previous heart attack seizures stomach or intestine problems thyroid disease an unusual or allergic reaction to dexamethasone, corticosteroids, other medicines, lactose, foods, dyes, or preservatives pregnant or  trying to get pregnant breast-feeding How should I use this medication? Take this medicine by mouth with a drink of water. Follow the directions on the prescription label. Take it with food or milk to avoid stomach upset. If you are taking this medicine once a  day, take it in the morning. Do not take more medicine than you are told to take. Do not suddenly stop taking your medicine because you may develop a severe reaction. Your doctor will tell you how much medicine to take. If your doctor wants you to stop the medicine, the dose maybe slowly lowered over time to avoid any side effects. Talk to your pediatrician regarding the use of this medicine in children.Special care may be needed. Patients over 17 years old may have a stronger reaction and need a smaller dose. Overdosage: If you think you have taken too much of this medicine contact apoison control center or emergency room at once. NOTE: This medicine is only for you. Do not share this medicine with others. What if I miss a dose? If you miss a dose, take it as soon as you can. If it is almost time for your next dose, talk to your doctor or health care professional. You may need to miss a dose or take an extra dose. Do not take double or extra doses withoutadvice. What may interact with this medication? Do not take this medicine with any of the following medications: live virus vaccines This medicine may also interact with the following medications: aminoglutethimide amphotericin B aspirin and aspirin-like medicines certain antibiotics like erythromycin, clarithromycin, and troleandomycin certain antivirals for HIV or hepatitis certain medicines for seizures like carbamazepine, phenobarbital, phenytoin certain medicines to treat myasthenia gravis cholestyramine cyclosporine digoxin diuretics ephedrine female hormones, like estrogen or progestins and birth control pills insulin or other medicines for diabetes isoniazid ketoconazole medicines that relax muscles for surgery mifepristone NSAIDs, medicines for pain and inflammation, like ibuprofen or naproxen rifampin skin tests for allergies thalidomide vaccines warfarin This list may not describe all possible interactions. Give your  health care provider a list of all the medicines, herbs, non-prescription drugs, or dietary supplements you use. Also tell them if you smoke, drink alcohol, or use illegaldrugs. Some items may interact with your medicine. What should I watch for while using this medication? Visit your health care professional for regular checks on your progress. Tell your health care professional if your symptoms do not start to get better or if they get worse. Your condition will be monitored carefully while you arereceiving this medicine. Wear a medical ID bracelet or chain. Carry a card that describes your diseaseand details of your medicine and dosage times. This medicine may increase your risk of getting an infection. Call your health care professional for advice if you get a fever, chills, or sore throat, or other symptoms of a cold or flu. Do not treat yourself. Try to avoid being around people who are sick. Call your health care professional if you are around anyone with measles, chickenpox, or if you develop sores or blistersthat do not heal properly. If you are going to need surgery or other procedures, tell your doctor or health care professional that you have taken this medicine within the last 42month. Ask your doctor or health care professional about your diet. You may need tolower the amount of salt you eat. This medicine may increase blood sugar. Ask your healthcare provider if changesin diet or medicines are needed if you have diabetes. What side effects  may I notice from receiving this medication? Side effects that you should report to your doctor or health care professionalas soon as possible: allergic reactions like skin rash, itching or hives, swelling of the face, lips, or tongue bloody or black, tarry stools changes in emotions or moods changes in vision confusion, excitement, restlessness depressed mood eye pain hallucinations fever or chills, cough, sore throat, pain or difficulty passing  urine muscle weakness severe or sudden stomach or belly pain signs and symptoms of high blood sugar such as being more thirsty or hungry or having to urinate more than normal. You may also feel very tired or have blurry vision. signs and symptoms of infection like fever; chills; cough; sore throat; pain or trouble passing urine swelling of ankles, feet unusual bruising or bleeding wounds that do not heal Side effects that usually do not require medical attention (report to yourdoctor or health care professional if they continue or are bothersome): increased appetite increased growth of face or body hair headache nausea, vomiting skin problems, acne, thin and shiny skin trouble sleeping weight gain This list may not describe all possible side effects. Call your doctor for medical advice about side effects. You may report side effects to FDA at1-800-FDA-1088. Where should I keep my medication? Keep out of the reach of children. Store at room temperature between 20 and 25 degrees C (68 and 77 degrees F).Protect from light. Throw away any unused medicine after the expiration date. NOTE: This sheet is a summary. It may not cover all possible information. If you have questions about this medicine, talk to your doctor, pharmacist, orhealth care provider.  2022 Elsevier/Gold Standard (2018-11-10 14:23:34)    To help prevent nausea and vomiting after your treatment, we encourage you to take your nausea medication as directed.  BELOW ARE SYMPTOMS THAT SHOULD BE REPORTED IMMEDIATELY: *FEVER GREATER THAN 100.4 F (38 C) OR HIGHER *CHILLS OR SWEATING *NAUSEA AND VOMITING THAT IS NOT CONTROLLED WITH YOUR NAUSEA MEDICATION *UNUSUAL SHORTNESS OF BREATH *UNUSUAL BRUISING OR BLEEDING *URINARY PROBLEMS (pain or burning when urinating, or frequent urination) *BOWEL PROBLEMS (unusual diarrhea, constipation, pain near the anus) TENDERNESS IN MOUTH AND THROAT WITH OR WITHOUT PRESENCE OF ULCERS (sore  throat, sores in mouth, or a toothache) UNUSUAL RASH, SWELLING OR PAIN  UNUSUAL VAGINAL DISCHARGE OR ITCHING   Items with * indicate a potential emergency and should be followed up as soon as possible or go to the Emergency Department if any problems should occur.  Please show the CHEMOTHERAPY ALERT CARD or IMMUNOTHERAPY ALERT CARD at check-in to the Emergency Department and triage nurse.  Should you have questions after your visit or need to cancel or reschedule your appointment, please contact [facility]  [phone] and follow the prompts.  Office hours are 8:00 a.m. to 4:30 p.m. Monday - Friday. Please note that voicemails left after 4:00 p.m. may not be returned until the following business day.  We are closed weekends and major holidays. You have access to a nurse at all times for urgent questions. Please call the main number to the clinic [phone] and follow the prompts.  For any non-urgent questions, you may also contact your provider using MyChart. We now offer e-Visits for anyone 31 and older to request care online for non-urgent symptoms. For details visit [email or web address].   Also download the MyChart app! Go to the app store, search "MyChart", open the app, select [facility], and log in with your MyChart username and password.  Due to Covid, a mask is required upon entering the hospital/clinic. If you do not have a mask, one will be given to you upon arrival. For doctor visits, patients may have 1 support person aged 90 or older with them. For treatment visits, patients cannot have anyone with them due to current Covid guidelines and our immunocompromised population.

## 2020-11-21 NOTE — Progress Notes (Signed)
[place]  [phone] [phone]  ID: Heather Peterson OB: May 23, 1940  MR#: [id number]  IBB#:[id number]  [name], MD as PCP - General (Family Medicine) Lloyd Huger, MD as Consulting Physician (Oncology)  CHIEF COMPLAINT: Multiple myeloma, subdural hematoma.  INTERVAL HISTORY: Patient returns to clinic for further evaluation and consideration of single agent velcade. Insomnia is worse. Previously no iimprovement with gabapentin, melatonin, or trazodone. Causes daytime sleepiness. Chronic fatigue. She has no other neurologic complaints.  She has chronic shortness of breath and requires oxygen 24 hours/day.  She denies any fevers.  She has a fair appetite.  She denies any chest pain, cough, or hemoptysis. She denies any nausea, vomiting, constipation, or diarrhea. She has no melena or hematochezia.  She has no urinary complaints.  Patient offers no further specific complaints today.  REVIEW OF SYSTEMS:   Review of Systems  Constitutional:  Positive for malaise/fatigue. Negative for chills, fever and weight loss.  HENT:  Negative for hearing loss, nosebleeds, sore throat and tinnitus.   Eyes:  Negative for blurred vision and double vision.  Respiratory:  Negative for cough, hemoptysis, shortness of breath and wheezing.   Cardiovascular:  Negative for chest pain, palpitations and leg swelling.  Gastrointestinal:  Negative for abdominal pain, blood in stool, constipation, diarrhea, melena, nausea and vomiting.  Genitourinary:  Negative for dysuria and urgency.  Musculoskeletal:  Positive for back pain. Negative for falls, joint pain and myalgias.  Skin:  Negative for itching and rash.  Neurological:  Negative for dizziness, tingling, sensory change, loss of consciousness, weakness and headaches.  Endo/Heme/Allergies:  Negative for environmental allergies. Does not bruise/bleed easily.  Psychiatric/Behavioral:   Negative for depression. The patient has insomnia. The patient is not nervous/anxious.   As per HPI. Otherwise, a complete review of systems is negative.  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[place])    Prolonged Q-T interval on ECG    Thyroid disease     PAST SURGICAL HISTORY: Past Surgical History:  Procedure Laterality Date   CENTRAL LINE INSERTION  03/11/2017   Procedure: CENTRAL LINE INSERTION;  Surgeon: Leonie Man, MD;  Location: [facility];  Service: Cardiovascular;;   CHOLECYSTECTOMY     COLONOSCOPY WITH PROPOFOL N/A 09/01/2019   Procedure: COLONOSCOPY WITH PROPOFOL;  Surgeon: Toledo, Benay Pike, MD;  Location: [facility];  Service: Gastroenterology;  Laterality: N/A;   CORONARY STENT INTERVENTION W/IMPELLA N/A 03/11/2017   Procedure: Coronary Stent Intervention w/Impella;  Surgeon: Leonie Man, MD;  Location: [facility];  Service: Cardiovascular;  Laterality: N/A;   ESOPHAGOGASTRODUODENOSCOPY (EGD) WITH PROPOFOL N/A 09/01/2019   Procedure: ESOPHAGOGASTRODUODENOSCOPY (EGD) WITH PROPOFOL;  Surgeon: Toledo, Benay Pike, MD;  Location: [facility];  Service: Gastroenterology;  Laterality: N/A;   ESOPHAGOGASTRODUODENOSCOPY (EGD) WITH PROPOFOL N/A 09/08/2019   Procedure: ESOPHAGOGASTRODUODENOSCOPY (EGD) WITH PROPOFOL;  Surgeon: [name], MD;  Location: [facility];  Service: Gastroenterology;  Laterality: N/A;   EYE SURGERY     HIP ARTHROPLASTY Right 03/29/2020  Procedure: ARTHROPLASTY BIPOLAR HIP (HEMIARTHROPLASTY);  Surgeon: Corky Mull, MD;  Location: [facility];  Service:  Orthopedics;  Laterality: Right;   INTRAVASCULAR PRESSURE WIRE/FFR STUDY N/A 03/11/2017   Procedure: INTRAVASCULAR PRESSURE WIRE/FFR STUDY;  Surgeon: Leonie Man, MD;  Location: [facility];  Service: Cardiovascular;  Laterality: N/A;   INTRAVASCULAR ULTRASOUND/IVUS N/A 03/11/2017   Procedure: Intravascular Ultrasound/IVUS;  Surgeon: Leonie Man, MD;  Location: [facility];  Service: Cardiovascular;  Laterality: N/A;   LEFT HEART CATH AND CORONARY ANGIOGRAPHY N/A 03/05/2017   Procedure: LEFT HEART CATH AND CORONARY ANGIOGRAPHY;  Surgeon: Isaias Cowman, MD;  Location: [facility];  Service: Cardiovascular;  Laterality: N/A;   PACEMAKER IMPLANT     pacemaker/defibrillator Left     FAMILY HISTORY: Family History  Problem Relation Age of Onset   Hypertension Father    Heart attack Father    Depression Sister    Depression Brother    Depression Brother     ADVANCED DIRECTIVES (Y/N):  N  HEALTH MAINTENANCE: Social History   Tobacco Use   Smoking status: Every Day    Packs/day: 0.25    Pack years: 0.00    Types: E-cigarettes, Cigarettes   Smokeless tobacco: Never  Vaping Use   Vaping Use: Former  Substance Use Topics   Alcohol use: Not Currently    Comment: occasionally   Drug use: Yes    Comment: prescribed pain meds     Colonoscopy:  PAP:  Bone density:  Lipid panel:  Allergies  Allergen Reactions   Celebrex [Celecoxib] Anaphylaxis   Glipizide Anaphylaxis   Levaquin [Levofloxacin In D5w] Other (See Comments)    Heart arrhthymias   Levofloxacin Other (See Comments) and Palpitations    ICD fired   Lisinopril Swelling    Lip and facial swelling   Sulfa Antibiotics Other (See Comments) and Anaphylaxis    Reaction: unknown   Metformin Other (See Comments)    Gi tolerance    Penicillins Rash and Other (See Comments)    Has patient had a PCN reaction causing immediate rash, facial/tongue/throat swelling, SOB or lightheadedness  with hypotension: Unknown Has patient had a PCN reaction causing severe rash involving mucus membranes or skin necrosis: No Has patient had a PCN reaction that required hospitalization: No Has patient had a PCN reaction occurring within the last 10 years: No If all of the above answers are "NO", then may proceed with Cephalosporin use.     Current Outpatient Medications  Medication Sig Dispense Refill   allopurinol (ZYLOPRIM) 100 MG tablet Take 50 mg by mouth daily.     aspirin EC 81 MG tablet Take 81 mg by mouth at bedtime.      dexamethasone (DECADRON) 4 MG tablet Take 5 tablets (20 mg total) by mouth daily. Take the day after Velcade on days 2,5,9,12. Take with breakfast 40 tablet 3   docusate sodium (COLACE) 100 MG capsule Take 100 mg by mouth 2 (two) times daily.     fexofenadine (ALLEGRA) 180 MG tablet Take 180 mg by mouth daily.     FLUoxetine (PROZAC) 10 MG capsule Take 10 mg by mouth daily.     gabapentin (NEURONTIN) 300 MG capsule Take 373m in AM, 302min afternoon, 90067mt night 150 capsule 3   gabapentin (NEURONTIN) 600 MG tablet Take 600 mg by mouth daily.     HYDROcodone-acetaminophen (NORCO/VICODIN) 5-325 MG tablet SMARTSIG:1 Tablet(s) By Mouth Every 12 Hours PRN 60 tablet 0  levothyroxine (SYNTHROID) 50 MCG tablet Take by mouth.     levothyroxine (SYNTHROID) 75 MCG tablet Take 1 tablet (75 mcg total) by mouth daily before breakfast. On Monday, Wednesday and Friday     magnesium oxide (MAG-OX) 400 MG tablet Take 200 mg by mouth daily.     mexiletine (MEXITIL) 200 MG capsule Take 1 capsule (200 mg total) by mouth every 12 (twelve) hours. 60 capsule 1   midodrine (PROAMATINE) 10 MG tablet Take 1 tablet (10 mg total) by mouth 2 (two) times daily with a meal.     mupirocin ointment (BACTROBAN) 2 % Apply 1 application topically 2 (two) times daily for 7 days. 22 g 0   ondansetron (ZOFRAN) 8 MG tablet Take by mouth every 8 (eight) hours as needed for nausea or vomiting.      OXYGEN Inhale 2 L into the lungs continuous.     pantoprazole (PROTONIX) 40 MG tablet Take 40 mg by mouth daily.      polyethylene glycol (MIRALAX) 17 g packet Take 17 g by mouth daily. 14 each 0   Prenatal Vit-Fe Fumarate-FA (PRENATAL MULTIVITAMIN) TABS tablet Take 1 tablet by mouth daily.     simvastatin (ZOCOR) 40 MG tablet Take 20 mg by mouth at bedtime.   2   traZODone (DESYREL) 50 MG tablet Take 50 mg by mouth at bedtime.     Ensure Max Protein (ENSURE MAX PROTEIN) LIQD Take 330 mLs (11 oz total) by mouth 2 (two) times daily between meals. (Patient not taking: No sig reported)     hydrocortisone (ANUSOL-HC) 2.5 % rectal cream Apply 1 application topically 2 (two) times daily. (Patient not taking: No sig reported)     Lactulose 20 GM/30ML SOLN Take 30 mLs (20 g total) by mouth in the morning and at bedtime. (Patient not taking: No sig reported) 450 mL 1   No current facility-administered medications for this visit.    OBJECTIVE: Vitals:   11/21/20 1038  BP: (!) 99/57  Pulse: 79  Resp: 18  Temp: (!) 97.5 F (36.4 C)  SpO2: 99%     Body mass index is 28.09 kg/m.    ECOG FS:1 - Symptomatic but completely ambulatory  General: Well-developed, well-nourished, no acute distress.  Sitting in a wheelchair. Eyes: Pink conjunctiva, anicteric sclera. HEENT: Normocephalic, moist mucous membranes. Lungs: No audible wheezing or coughing. Heart: Regular rate and rhythm. Abdomen: Soft, nontender, no obvious distention. Musculoskeletal: No edema, cyanosis, or clubbing. Neuro: Alert, answering all questions appropriately. Cranial nerves grossly intact. Skin: No rashes or petechiae noted. Psych: Normal affect.   LAB RESULTS:  Lab Results  Component Value Date   NA 136 11/21/2020   K 4.0 11/21/2020   CL 95 (L) 11/21/2020   CO2 30 11/21/2020   GLUCOSE 125 (H) 11/21/2020   BUN 66 (H) 11/21/2020   CREATININE 1.47 (H) 11/21/2020   CALCIUM 9.1 11/21/2020   PROT 6.8 11/21/2020   ALBUMIN  3.8 11/21/2020   AST 19 11/21/2020   ALT 18 11/21/2020   ALKPHOS 95 11/21/2020   BILITOT 0.2 (L) 11/21/2020   GFRNONAA 36 (L) 11/21/2020   GFRAA 30 (L) 09/09/2019    Lab Results  Component Value Date   WBC 10.6 (H) 11/21/2020   NEUTROABS 6.1 11/21/2020   HGB 8.8 (L) 11/21/2020   HCT 29.1 (L) 11/21/2020   MCV 92.4 11/21/2020   PLT 255 11/21/2020   Lab Results  Component Value Date   IRON 85 05/23/2020   TIBC 421  05/23/2020   IRONPCTSAT 20 05/23/2020   Lab Results  Component Value Date   FERRITIN 50 05/23/2020     STUDIES: MR LUMBAR SPINE WO CONTRAST  Result Date: 11/11/2020 CLINICAL DATA:  Chronic low back pain with right-sided sciatica EXAM: MRI LUMBAR SPINE WITHOUT CONTRAST TECHNIQUE: Multiplanar, multisequence MR imaging of the lumbar spine was performed. No intravenous contrast was administered. COMPARISON:  CT scan 06/18/2020 FINDINGS: Segmentation: The lowest lumbar type non-rib-bearing vertebra is labeled as L5. The T12 ribs are hypoplastic. Alignment:  3 mm of degenerative grade 1 anterolisthesis at L3-4. Vertebrae: Marrow heterogeneity is present. Although this can be caused by marrow infiltrative processes, the most common causes include anemia, smoking, obesity, or advancing age. Mild type 2 degenerative endplate findings at O5-3. Disc desiccation at all lumbar levels. Loss of disc height especially at L4-5. Conus medullaris and cauda equina: Conus extends to the T12 level. Conus and cauda equina appear normal. Paraspinal and other soft tissues: Scarring in the right kidney lower pole laterally and also in the left mid kidney. A fluid signal intensity lesion of the left kidney is partially characterized on today's exam. This is statistically likely to be a cyst but not technically specific. Left adrenal adenoma. There is a small but abnormal amount of free pelvic fluid. 0.6 by 0.4 cm nonspecific nodule posteriorly along this free pelvic fluid on image 7 series 3. Sigmoid  diverticulosis. Disc levels: L1-2: No impingement.  Mild degenerative right facet arthropathy. L2-3: Borderline bilateral foraminal stenosis due to disc bulge and facet arthropathy. L3-4: No impingement. Disc bulge and facet arthropathy with small left inferior foraminal disc protrusion. L4-5: Mild right and borderline left subarticular lateral recess stenosis with borderline right foraminal stenosis due to intervertebral and facet spurring along with disc bulge and small central disc protrusion. L5-S1: No impingement. Small central disc protrusion. Intervertebral spurring. IMPRESSION: 1. Lumbar spondylosis and degenerative disc disease, causing mild impingement at L4-5 and borderline impingement at L2-3. 2. Small but abnormal amount of free pelvic fluid with a small nonspecific nodule posteriorly along the margin of the free fluid, significance uncertain. 3. Marrow heterogeneity is present. Although this can be caused by marrow infiltrative processes, the most common causes include anemia, smoking, obesity, or advancing age. 4. Sigmoid diverticulosis. Electronically Signed   By: Van Clines M.D.   On: 11/11/2020 12:08    ASSESSMENT: Multiple myeloma, fractured right hip.  PLAN:    1.  Multiple myeloma: Bone marrow biopsy confirmed diagnosis with plasma cells up to 90% of biopsy.  Cytogenetics are reported as normal.  Metastatic bone survey from March 03, 2020 reviewed independently with multiple skeletal lucencies consistent with myeloma. Her M spike was initially 1.2 and initially trended down to 0.1, but more recently has trended up to 0.7 IgA levels initially were 2773, trended down to normal, but now increased to 1197. Kappa and lambda free light chains continue to be within normal limits.  Patient's creatinine continues to trend up and is now 1.67.  She also has progressive anemia with a hemoglobin of 8.4.  Initial plan was to give Velcade on days 1, 4, 8, and 11 along with 10 mg Revlimid on  days 1 through 14. Patient will also receive weekly 20 mg dexamethasone.  Revlimid was discontinued secondary to intolerance.  Patient last received single agent Velcade on June 20, 2020.  Proceed with D8C4 of velcade today. Return to clinic Friday for Velcade only, and then 1 week for Velcade and further evaluation.  2.  Anemia: Hemoglobin continues to trend down is now 8.8.  Previously, iron stores were within normal limits.  Likely multifactorial with chronic renal insufficiency, underlying myeloma, as well as history of recent GI bleed.  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  Monitor.  3.  Chronic renal insufficiency: Creatinine 1.47 today. Monitor.  4.  Ascites: Patient does not complain of this today.  Continue follow-up with heart failure clinic as scheduled. 5.  Angiodysplastic lesions: Continue follow-up with GI as scheduled. 6. Constipation: Resolved. Continue stool softeners and MiraLAX as needed. 7.  Right hip fracture: Continue rehab and follow-up with orthopedics as scheduled. Continue Percocet as needed. 8. Thrombocytopenia: Resolved. 9.  Peripheral neuropathy: chronic and unchanged. Continue gabapentin 10.  Right foot pain: Does not appear to be a peripheral neuropathy or related to her myeloma.  Continue hydrocodone as needed. 11. Insomnia- poor sleep. Likely related to steroids. No improvement with melatonin or trazodone. Trial of ambien 5 mg prn.   Patient expressed understanding and was in agreement with this plan. She also understands that She can call clinic at any time with any questions, concerns, or complaints.    Verlon Au, NP

## 2020-11-21 NOTE — Progress Notes (Signed)
Follow up oncology visit. Reports oral steroids makes it difficult to sleep. Appetite is good. Energy is low.

## 2020-11-22 ENCOUNTER — Encounter: Payer: Medicare Other | Attending: Internal Medicine | Admitting: Internal Medicine

## 2020-11-22 ENCOUNTER — Telehealth: Payer: Self-pay | Admitting: *Deleted

## 2020-11-22 ENCOUNTER — Encounter: Payer: Self-pay | Admitting: Oncology

## 2020-11-22 DIAGNOSIS — N183 Chronic kidney disease, stage 3 unspecified: Secondary | ICD-10-CM | POA: Diagnosis not present

## 2020-11-22 DIAGNOSIS — Z7984 Long term (current) use of oral hypoglycemic drugs: Secondary | ICD-10-CM | POA: Insufficient documentation

## 2020-11-22 DIAGNOSIS — I509 Heart failure, unspecified: Secondary | ICD-10-CM | POA: Diagnosis not present

## 2020-11-22 DIAGNOSIS — F17200 Nicotine dependence, unspecified, uncomplicated: Secondary | ICD-10-CM | POA: Diagnosis not present

## 2020-11-22 DIAGNOSIS — T8130XA Disruption of wound, unspecified, initial encounter: Secondary | ICD-10-CM

## 2020-11-22 DIAGNOSIS — J449 Chronic obstructive pulmonary disease, unspecified: Secondary | ICD-10-CM | POA: Insufficient documentation

## 2020-11-22 DIAGNOSIS — Y848 Other medical procedures as the cause of abnormal reaction of the patient, or of later complication, without mention of misadventure at the time of the procedure: Secondary | ICD-10-CM | POA: Insufficient documentation

## 2020-11-22 DIAGNOSIS — T8133XA Disruption of traumatic injury wound repair, initial encounter: Secondary | ICD-10-CM | POA: Insufficient documentation

## 2020-11-22 DIAGNOSIS — I132 Hypertensive heart and chronic kidney disease with heart failure and with stage 5 chronic kidney disease, or end stage renal disease: Secondary | ICD-10-CM | POA: Insufficient documentation

## 2020-11-22 DIAGNOSIS — L98492 Non-pressure chronic ulcer of skin of other sites with fat layer exposed: Secondary | ICD-10-CM | POA: Diagnosis not present

## 2020-11-22 DIAGNOSIS — S61401A Unspecified open wound of right hand, initial encounter: Secondary | ICD-10-CM | POA: Diagnosis not present

## 2020-11-22 DIAGNOSIS — E1122 Type 2 diabetes mellitus with diabetic chronic kidney disease: Secondary | ICD-10-CM | POA: Insufficient documentation

## 2020-11-22 LAB — IGG, IGA, IGM
IgA: 1037 mg/dL — ABNORMAL HIGH (ref 64–422)
IgG (Immunoglobin G), Serum: 173 mg/dL — ABNORMAL LOW (ref 586–1602)
IgM (Immunoglobulin M), Srm: <5 mg/dL — ABNORMAL LOW (ref 26–217)

## 2020-11-22 LAB — KAPPA/LAMBDA LIGHT CHAINS
Kappa free light chain: 4.4 mg/L (ref 3.3–19.4)
Kappa, lambda light chain ratio: 0.72 (ref 0.26–1.65)
Lambda free light chains: 6.1 mg/L (ref 5.7–26.3)

## 2020-11-22 MED ORDER — ZOLPIDEM TARTRATE 5 MG PO TABS: 5.0000 mg | ORAL_TABLET | Freq: Every evening | ORAL | 0 refills | Status: DC | PRN

## 2020-11-22 NOTE — Progress Notes (Addendum)
FINNLEIGH, MARCHETTI ([id number]) Visit Report for 11/22/2020 Allergy List Details Patient Name: Heather Peterson, Heather Peterson. Date of Service: 11/22/2020 12:30 PM Medical Record Number: [id number] Patient Account Number: [id number] Date of Birth/Sex: [birth year] (80 y.o. F) Treating RN: Dolan Amen Primary Care Lijah Bourque: Thereasa Distance Other Clinician: Referring Chloe Bluett: Thereasa Distance Treating Maddock Finigan/Extender: Yaakov Guthrie in Treatment: 0 Allergies Active Allergies Celebrex glipizide Levaquin lisinopril Sulfa (Sulfonamide Antibiotics) Severity: Severe metformin penicillin Allergy Notes Electronic Signature(s) Signed: 11/22/2020 4:41:30 PM By: Dolan Amen RN Entered By: Dolan Amen on 11/22/2020 13:10:27 Heather Peterson ([id number]) -------------------------------------------------------------------------------- Arrival Information Details Patient Name: Heather Peterson, Heather Peterson. Date of Service: 11/22/2020 12:30 PM Medical Record Number: [id number] Patient Account Number: [id number] Date of Birth/Sex: [birth year] (80 y.o. F) Treating RN: Dolan Amen Primary Care Marrie Chandra: Thereasa Distance Other Clinician: Referring Brithney Bensen: Thereasa Distance Treating Jhonnie Aliano/Extender: Yaakov Guthrie in Treatment: 0 Visit Information Patient Arrived: Charlyn Minerva Time: 13:02 Accompanied By: husband Transfer Assistance: None Patient Identification Verified: Yes Secondary Verification Process Completed: Yes Patient Has Alerts: Yes Patient Alerts: Type II Diabetic PACEMAKER Electronic Signature(s) Signed: 11/22/2020 3:14:49 PM By: Dolan Amen RN Entered By: Dolan Amen on 11/22/2020 15:14:49 Heather Peterson ([id number]) -------------------------------------------------------------------------------- Clinic Level of Care Assessment Details Patient Name: Heather Peterson. Date of Service: 11/22/2020 12:30 PM Medical Record Number: [id number] Patient  Account Number: [id number] Date of Birth/Sex: Dec 09, 1940 (80 y.o. F) Treating RN: Dolan Amen Primary Care Audris Speaker: Thereasa Distance Other Clinician: Referring Merwin Breden: Thereasa Distance Treating Caelin Rosen/Extender: Yaakov Guthrie in Treatment: 0 Clinic Level of Care Assessment Items TOOL 1 Quantity Score X - Use when EandM and Procedure is performed on INITIAL visit 1 0 ASSESSMENTS - Nursing Assessment / Reassessment X - General Physical Exam (combine w/ comprehensive assessment (listed just below) when performed on new 1 20 pt. evals) X- 1 25 Comprehensive Assessment (HX, ROS, Risk Assessments, Wounds Hx, etc.) ASSESSMENTS - Wound and Skin Assessment / Reassessment _0  - Dermatologic / Skin Assessment (not related to wound area) 0 ASSESSMENTS - Ostomy and/or Continence Assessment and Care _1  - Incontinence Assessment and Management 0 _2  - 0 Ostomy Care Assessment and Management (repouching, etc.) PROCESS - Coordination of Care X - Simple Patient / Family Education for ongoing care 1 15 _3  - 0 Complex (extensive) Patient / Family Education for ongoing care X- 1 10 Staff obtains Programmer, systems, Records, Test Results / Process Orders _4  - 0 Staff telephones HHA, Nursing Homes / Clarify orders / etc _5  - 0 Routine Transfer to another Facility (non-emergent condition) _6  - 0 Routine Hospital Admission (non-emergent condition) _7  - 0 New Admissions / Biomedical engineer / Ordering NPWT, Apligraf, etc. _8  - 0 Emergency Hospital Admission (emergent condition) PROCESS - Special Needs _9  - Pediatric / Minor Patient Management 0 _10  - 0 Isolation Patient Management _11  - 0 Hearing / Language / Visual special needs _12  - 0 Assessment of Community assistance (transportation, D/C planning, etc.) _13  - 0 Additional assistance / Altered mentation _14  - 0 Support Surface(s) Assessment (bed, cushion, seat, etc.) INTERVENTIONS - Miscellaneous _15  - External ear exam 0 _16  -  0 Patient Transfer (multiple staff / Civil Service fast streamer / Similar devices) _17  - 0 Simple Staple / Suture removal (25 or less) _18  - 0 Complex Staple / Suture removal (26 or more) _19  - 0 Hypo/Hyperglycemic Management (do not check if billed separately) _20  - 0 Ankle / Brachial Index (ABI) - do not check if billed separately Has the patient been seen at the  hospital within the last three years: Yes Total Score: 70 Level Of Care: New/Established - Level 2 Heather Peterson, Heather Peterson ([id number]) Electronic Signature(s) Signed: 11/22/2020 4:41:30 PM By: Dolan Amen RN Entered By: Dolan Amen on 11/22/2020 15:01:13 Heather Peterson ([id number]) -------------------------------------------------------------------------------- Encounter Discharge Information Details Patient Name: Heather Peterson, Heather Peterson. Date of Service: 11/22/2020 12:30 PM Medical Record Number: [id number] Patient Account Number: [id number] Date of Birth/Sex: 1941-03-04 (80 y.o. F) Treating RN: Dolan Amen Primary Care Ghadeer Kastelic: Thereasa Distance Other Clinician: Referring Tobiah Celestine: Thereasa Distance Treating Aster Screws/Extender: Yaakov Guthrie in Treatment: 0 Encounter Discharge Information Items Post Procedure Vitals Discharge Condition: Stable Temperature (F): 98.3 Ambulatory Status: Walker Pulse (bpm): 89 Discharge Destination: Home Respiratory Rate (breaths/min): 18 Transportation: Private Auto Blood Pressure (mmHg): 107/69 Accompanied By: husband Schedule Follow-up Appointment: Yes Clinical Summary of Care: Electronic Signature(s) Signed: 11/22/2020 3:02:34 PM By: Dolan Amen RN Entered By: Dolan Amen on 11/22/2020 15:02:33 Heather Peterson ([id number]) -------------------------------------------------------------------------------- Lower Extremity Assessment Details Patient Name: Heather Peterson. Date of Service: 11/22/2020 12:30 PM Medical Record Number: [id number] Patient Account Number:  [id number] Date of Birth/Sex: 24-Jun-1940 (80 y.o. F) Treating RN: Dolan Amen Primary Care Diamante Rubin: Thereasa Distance Other Clinician: Referring Ani Deoliveira: Thereasa Distance Treating Sirron Francesconi/Extender: Yaakov Guthrie in Treatment: 0 Electronic Signature(s) Signed: 11/22/2020 4:41:30 PM By: Dolan Amen RN Entered By: Dolan Amen on 11/22/2020 13:25:58 Heather Peterson, Heather Peterson Kitchen ([id number]) -------------------------------------------------------------------------------- Multi Wound Chart Details Patient Name: Heather Peterson, Heather Peterson. Date of Service: 11/22/2020 12:30 PM Medical Record Number: [id number] Patient Account Number: [id number] Date of Birth/Sex: Jun 11, 1940 (80 y.o. F) Treating RN: Dolan Amen Primary Care Tamrah Victorino: Thereasa Distance Other Clinician: Referring Lacheryl Niesen: Thereasa Distance Treating Berthe Oley/Extender: Yaakov Guthrie in Treatment: 0 Vital Signs Height(in): 68 Pulse(bpm): 11 Weight(lbs): 156 Blood Pressure(mmHg): 107/69 Body Mass Index(BMI): 28 Temperature(F): 98.3 Respiratory Rate(breaths/min): 18 Photos: [N/A:N/A] Wound Location: Right Hand - Palm N/A N/A Wounding Event: Skin Tear/Laceration N/A N/A Primary Etiology: Dehisced Wound N/A N/A Comorbid History: Cataracts, Anemia, Chronic N/A N/A Obstructive Pulmonary Disease (COPD), Congestive Heart Failure, Hypertension, Myocardial Infarction, Type II Diabetes, End Stage Renal Disease, Gout, Osteoarthritis, Neuropathy Date Acquired: 11/08/2020 N/A N/A Weeks of Treatment: 0 N/A N/A Wound Status: Open N/A N/A Measurements L x W x D (cm) 2x0.6x0.3 N/A N/A Area (cm) : 0.942 N/A N/A Volume (cm) : 0.283 N/A N/A Classification: Full Thickness Without Exposed N/A N/A Support Structures Exudate Amount: Medium N/A N/A Exudate Type: Serosanguineous N/A N/A Exudate Color: red, brown N/A N/A Granulation Amount: Medium (34-66%) N/A N/A Granulation Quality: Pink N/A N/A Necrotic Amount: Medium  (34-66%) N/A N/A Exposed Structures: Fat Layer (Subcutaneous Tissue): N/A N/A Yes Fascia: No Tendon: No Muscle: No Joint: No Bone: No Epithelialization: None N/A N/A Debridement: Chemical/Enzymatic/Mechanical N/A N/A Pre-procedure Verification/Time 13:38 N/A N/A Out Taken: Instrument: Other(cotton tipped applicator) N/A N/A Bleeding: None N/A N/A Debridement Treatment Procedure was tolerated well N/A N/A Response: Post Debridement 2x0.6x0.3 N/A N/A Measurements L x W x D (cm) Post Debridement Volume: 0.283 N/A N/A (cm) Wisor, Nafisa M. ([id number]) Procedures Performed: Debridement N/A N/A Treatment Notes Wound #1 (Hand - Palm) Wound Laterality: Right Cleanser [place] Ancillary Kit - 15 Heather Peterson Supply Discharge Instruction: Use supplies as instructed; Kit contains: (15) Saline Bullets; (15) 3x3 Gauze; 15 pr Gloves Peri-Wound Care Topical Santyl Collagenase Ointment, 30 (gm), tube Discharge Instruction: Apply nickel thick to wound bed only Primary Dressing Non-Adherent Pad 3x8 (in/in) Discharge Instruction: Cover ointment to alleviate sticking. Secondary Dressing Gauze Discharge Instruction: Cover  non-adherent for extra padding Kerlix 4.5 x 4.1 (in/yd) Discharge Instruction: Secure dressing with Kerlix Secured With 85M Medipore H Soft Cloth Surgical Tape, 2x2 (in/yd) Discharge Instruction: Secure kerlix Compression Wrap Compression Stockings Add-Ons Electronic Signature(s) Signed: 11/24/2020 3:55:23 PM By: Kalman Shan DO Previous Signature: 11/22/2020 4:41:30 PM Version By: Dolan Amen RN Entered By: Kalman Shan on 11/24/2020 15:46:05 Heather Peterson ([id number]) -------------------------------------------------------------------------------- [place] Details Patient Name: Heather Peterson, Heather Peterson. Date of Service: 11/22/2020 12:30 PM Medical Record Number: [id number] Patient Account Number: [id number] Date of Birth/Sex: 01-02-1941 (80  y.o. F) Treating RN: Dolan Amen Primary Care Sanjna Haskew: Thereasa Distance Other Clinician: Referring Zarin Hagmann: Thereasa Distance Treating Frank Novelo/Extender: Yaakov Guthrie in Treatment: 0 Active Inactive Necrotic Tissue Nursing Diagnoses: Impaired tissue integrity related to necrotic/devitalized tissue Goals: Necrotic/devitalized tissue will be minimized in the wound bed Date Initiated: 11/22/2020 Target Resolution Date: 11/22/2020 Goal Status: Active Patient/caregiver will verbalize understanding of reason and process for debridement of necrotic tissue Date Initiated: 11/22/2020 Target Resolution Date: 11/22/2020 Goal Status: Active Interventions: Assess patient pain level pre-, during and post procedure and prior to discharge Provide education on necrotic tissue and debridement process Treatment Activities: Apply topical anesthetic as ordered : 11/22/2020 Biologic debridement : 11/22/2020 Enzymatic debridement : 11/22/2020 Excisional debridement : 11/22/2020 Notes: Orientation to the Wound Care Program Nursing Diagnoses: Knowledge deficit related to the wound healing center program Goals: Patient/caregiver will verbalize understanding of the [place] Date Initiated: 11/22/2020 Target Resolution Date: 11/22/2020 Goal Status: Active Interventions: Provide education on orientation to the wound center Notes: Wound/Skin Impairment Nursing Diagnoses: Impaired tissue integrity Goals: Patient/caregiver will verbalize understanding of skin care regimen Date Initiated: 11/22/2020 Target Resolution Date: 11/22/2020 Goal Status: Active Ulcer/skin breakdown will have a volume reduction of 30% by week 4 Date Initiated: 11/22/2020 Target Resolution Date: 12/23/2020 Goal Status: Active Ulcer/skin breakdown will have a volume reduction of 50% by week 8 Date Initiated: 11/22/2020 Target Resolution Date: 01/23/2021 Heather Peterson, Heather Peterson ([id number]) Goal Status:  Active Ulcer/skin breakdown will have a volume reduction of 80% by week 12 Date Initiated: 11/22/2020 Target Resolution Date: 02/22/2021 Goal Status: Active Ulcer/skin breakdown will heal within 14 weeks Date Initiated: 11/22/2020 Target Resolution Date: 03/25/2021 Goal Status: Active Interventions: Assess patient/caregiver ability to obtain necessary supplies Assess patient/caregiver ability to perform ulcer/skin care regimen upon admission and as needed Assess ulceration(s) every visit Provide education on ulcer and skin care Treatment Activities: Referred to DME Delroy Ordway for dressing supplies : 11/22/2020 Skin care regimen initiated : 11/22/2020 Notes: Electronic Signature(s) Signed: 11/22/2020 4:41:30 PM By: Dolan Amen RN Entered By: Dolan Amen on 11/22/2020 13:38:10 Heather Peterson ([id number]) -------------------------------------------------------------------------------- Pain Assessment Details Patient Name: Heather Peterson, Heather Peterson. Date of Service: 11/22/2020 12:30 PM Medical Record Number: [id number] Patient Account Number: [id number] Date of Birth/Sex: July 17, 1940 (80 y.o. F) Treating RN: Dolan Amen Primary Care Grant Swager: Thereasa Distance Other Clinician: Referring Laressa Bolinger: Thereasa Distance Treating Mekisha Bittel/Extender: Yaakov Guthrie in Treatment: 0 Active Problems Location of Pain Severity and Description of Pain Patient Has Paino Yes Site Locations Pain Location: Pain in Ulcers Rate the pain. Current Pain Level: 3 Pain Management and Medication Current Pain Management: Electronic Signature(s) Signed: 11/22/2020 4:41:30 PM By: Dolan Amen RN Entered By: Dolan Amen on 11/22/2020 13:05:11 Heather Peterson ([id number]) -------------------------------------------------------------------------------- Patient/Caregiver Education Details Patient Name: Heather Peterson, Heather Peterson. Date of Service: 11/22/2020 12:30 PM Medical Record Number:  [id number] Patient Account Number: [id number] Date of Birth/Gender: 24-Jan-1941 (80 y.o. F) Treating  RN: Dolan Amen Primary Care Physician: Thereasa Distance Other Clinician: Referring Physician: Thereasa Distance Treating Physician/Extender: Yaakov Guthrie in Treatment: 0 Education Assessment Education Provided To: Patient Education Topics Provided Welcome To [facility]: Methods: Explain/Verbal Responses: State content correctly Wound/Skin Impairment: Methods: Explain/Verbal Responses: State content correctly Electronic Signature(s) Signed: 11/22/2020 4:41:30 PM By: Dolan Amen RN Entered By: Dolan Amen on 11/22/2020 15:01:30 Heather Peterson ([id number]) -------------------------------------------------------------------------------- Wound Assessment Details Patient Name: Heather Peterson, Heather Peterson. Date of Service: 11/22/2020 12:30 PM Medical Record Number: [id number] Patient Account Number: [id number] Date of Birth/Sex: Jan 06, 1941 (80 y.o. F) Treating RN: Dolan Amen Primary Care Kialee Kham: Thereasa Distance Other Clinician: Referring Kyleah Pensabene: Thereasa Distance Treating Hollyanne Schloesser/Extender: Yaakov Guthrie in Treatment: 0 Wound Status Wound Number: 1 Primary Dehisced Wound Etiology: Wound Location: Right Hand - Palm Wound Open Wounding Event: Skin Tear/Laceration Status: Date Acquired: 11/08/2020 Comorbid Cataracts, Anemia, Chronic Obstructive Pulmonary Disease Weeks Of Treatment: 0 History: (COPD), Congestive Heart Failure, Hypertension, Clustered Wound: No Myocardial Infarction, Type II Diabetes, End Stage Renal Disease, Gout, Osteoarthritis, Neuropathy Photos Wound Measurements Length: (cm) 2 Width: (cm) 0.6 Depth: (cm) 0.3 Area: (cm) 0.942 Volume: (cm) 0.283 % Reduction in Area: % Reduction in Volume: Epithelialization: None Tunneling: No Undermining: No Wound Description Classification: Full Thickness Without Exposed  Support Structu Exudate Amount: Medium Exudate Type: Serosanguineous Exudate Color: red, brown res Foul Odor After Cleansing: No Slough/Fibrino Yes Wound Bed Granulation Amount: Medium (34-66%) Exposed Structure Granulation Quality: Pink Fascia Exposed: No Necrotic Amount: Medium (34-66%) Fat Layer (Subcutaneous Tissue) Exposed: Yes Necrotic Quality: Adherent Slough Tendon Exposed: No Muscle Exposed: No Joint Exposed: No Bone Exposed: No Electronic Signature(s) Signed: 11/22/2020 4:41:30 PM By: Dolan Amen RN Entered By: Dolan Amen on 11/22/2020 13:25:44 Sexson, [name] ([id number]) -------------------------------------------------------------------------------- Vitals Details Patient Name: Heather Peterson. Date of Service: 11/22/2020 12:30 PM Medical Record Number: [id number] Patient Account Number: [id number] Date of Birth/Sex: 01/30/1941 (80 y.o. F) Treating RN: Dolan Amen Primary Care Arlen Legendre: Thereasa Distance Other Clinician: Referring Lewis Keats: Thereasa Distance Treating Bhavika Schnider/Extender: Yaakov Guthrie in Treatment: 0 Vital Signs Time Taken: 13:05 Temperature (F): 98.3 Height (in): 63 Pulse (bpm): 89 Source: Stated Respiratory Rate (breaths/min): 18 Weight (lbs): 156 Blood Pressure (mmHg): 107/69 Source: Measured Reference Range: 80 - 120 mg / dl Body Mass Index (BMI): 27.6 Electronic Signature(s) Signed: 11/22/2020 4:41:30 PM By: Dolan Amen RN Entered By: Dolan Amen on 11/22/2020 13:07:27

## 2020-11-22 NOTE — Progress Notes (Signed)
Heather Peterson, Heather Peterson ([id number]) Visit Report for 11/22/2020 Abuse/Suicide Risk Screen Details Patient Name: Heather Peterson, Heather Peterson. Date of Service: 11/22/2020 12:30 PM Medical Record Number: [id number] Patient Account Number: [id number] Date of Birth/Sex: 11/11/1940 (80 y.o. F) Treating RN: Dolan Amen Primary Care Katelee Schupp: Thereasa Distance Other Clinician: Referring Arianny Pun: Laurene Footman Treating Casmer Yepiz/Extender: Yaakov Guthrie in Treatment: 0 Abuse/Suicide Risk Screen Items Answer ABUSE RISK SCREEN: Has anyone close to you tried to hurt or harm you recentlyo No Do you feel uncomfortable with anyone in your familyo No Has anyone forced you do things that you didnot want to doo No Electronic Signature(s) Signed: 11/22/2020 4:41:30 PM By: Dolan Amen RN Entered By: Dolan Amen on 11/22/2020 13:15:10 Heather Peterson ([id number]) -------------------------------------------------------------------------------- Activities of Daily Living Details Patient Name: Heather Peterson, Heather Peterson. Date of Service: 11/22/2020 12:30 PM Medical Record Number: [id number] Patient Account Number: [id number] Date of Birth/Sex: Oct 09, 1940 (80 y.o. F) Treating RN: Dolan Amen Primary Care Llewelyn Sheaffer: Thereasa Distance Other Clinician: Referring Zenola Dezarn: Laurene Footman Treating Darral Rishel/Extender: Yaakov Guthrie in Treatment: 0 Activities of Daily Living Items Answer Activities of Daily Living (Please select one for each item) Drive Automobile Not Able Take Medications Need Assistance Use Telephone Need Assistance Care for Appearance Need Assistance Use Toilet Need Assistance Bath / Shower Need Assistance Dress Self Need Assistance Feed Self Need Assistance Walk Need Assistance Get In / Out Bed Need Assistance Housework Need Assistance Prepare Meals Need Assistance Handle Money Need Assistance Shop for Self Need Assistance Electronic Signature(s) Signed: 11/22/2020 4:41:30  PM By: Dolan Amen RN Entered By: Dolan Amen on 11/22/2020 13:15:46 Heather Peterson ([id number]) -------------------------------------------------------------------------------- Education Screening Details Patient Name: Heather Peterson. Date of Service: 11/22/2020 12:30 PM Medical Record Number: [id number] Patient Account Number: [id number] Date of Birth/Sex: [birth year] (80 y.o. F) Treating RN: Dolan Amen Primary Care Karon Cotterill: Thereasa Distance Other Clinician: Referring Tanay Massiah: Laurene Footman Treating Kmya Placide/Extender: Yaakov Guthrie in Treatment: 0 Primary Learner Assessed: Patient Learning Preferences/Education Level/Primary Language Learning Preference: Explanation, Demonstration Highest Education Level: College or Above Preferred Language: English Cognitive Barrier Language Barrier: No Translator Needed: No Memory Deficit: No Emotional Barrier: No Cultural/Religious Beliefs Affecting Medical Care: No Physical Barrier Impaired Vision: Yes Glasses Impaired Hearing: No Decreased Hand dexterity: No Knowledge/Comprehension Knowledge Level: Medium Comprehension Level: Medium Ability to understand written instructions: Medium Ability to understand verbal instructions: Medium Motivation Anxiety Level: Calm Cooperation: Cooperative Education Importance: Acknowledges Need Interest in Health Problems: Asks Questions Perception: Coherent Willingness to Engage in Self-Management Medium Activities: Readiness to Engage in Self-Management Medium Activities: Electronic Signature(s) Signed: 11/22/2020 4:41:30 PM By: Dolan Amen RN Entered By: Dolan Amen on 11/22/2020 13:16:19 Heather Peterson, Heather Peterson ([id number]) -------------------------------------------------------------------------------- Fall Risk Assessment Details Patient Name: Heather Peterson. Date of Service: 11/22/2020 12:30 PM Medical Record Number: [id number] Patient Account  Number: [id number] Date of Birth/Sex: 02-09-1941 (80 y.o. F) Treating RN: Dolan Amen Primary Care Markevion Lattin: Thereasa Distance Other Clinician: Referring Asra Gambrel: Laurene Footman Treating Avigail Pilling/Extender: Yaakov Guthrie in Treatment: 0 Fall Risk Assessment Items Have you had 2 or more falls in the last 12 monthso 0 Yes Have you had any fall that resulted in injury in the last 12 monthso 0 Yes FALLS RISK SCREEN History of falling - immediate or within 3 months 25 Yes Secondary diagnosis (Do you have 2 or more medical diagnoseso) 15 Yes Ambulatory aid None/bed rest/wheelchair/nurse 0 No Crutches/cane/walker 15 Yes Furniture 0 No Intravenous therapy Access/Saline/Heparin Lock 0 No Gait/Transferring Normal/ bed  rest/ wheelchair 0 No Weak (short steps with or without shuffle, stooped but able to lift head while walking, may 10 Yes seek support from furniture) Impaired (short steps with shuffle, may have difficulty arising from chair, head down, impaired 0 No balance) Mental Status Oriented to own ability 0 Yes Electronic Signature(s) Signed: 11/22/2020 4:41:30 PM By: Dolan Amen RN Entered By: Dolan Amen on 11/22/2020 13:16:41 [place], [place]. ([id number]) -------------------------------------------------------------------------------- Foot Assessment Details Patient Name: Heather Peterson. Date of Service: 11/22/2020 12:30 PM Medical Record Number: [id number] Patient Account Number: [id number] Date of Birth/Sex: 21-Mar-1941 (80 y.o. F) Treating RN: Dolan Amen Primary Care Timiya Howells: Thereasa Distance Other Clinician: Referring Johnpaul Gillentine: Laurene Footman Treating Macarius Ruark/Extender: Yaakov Guthrie in Treatment: 0 Foot Assessment Items Site Locations + = Sensation present, - = Sensation absent, C = Callus, U = Ulcer R = Redness, W = Warmth, M = Maceration, PU = Pre-ulcerative lesion F = Fissure, S = Swelling, D = Dryness Assessment Right:  Left: Other Deformity: No No Prior Foot Ulcer: No No Prior Amputation: No No Charcot Joint: No No Ambulatory Status: Ambulatory With Help Assistance Device: Walker Gait: Steady Electronic Signature(s) Signed: 11/22/2020 4:41:30 PM By: Dolan Amen RN Entered By: Dolan Amen on 11/22/2020 13:18:35 Heather Peterson ([id number]) -------------------------------------------------------------------------------- Nutrition Risk Screening Details Patient Name: Heather Peterson. Date of Service: 11/22/2020 12:30 PM Medical Record Number: [id number] Patient Account Number: [id number] Date of Birth/Sex: 1940-11-07 (80 y.o. F) Treating RN: Dolan Amen Primary Care Athony Coppa: Thereasa Distance Other Clinician: Referring Caroline Matters: Laurene Footman Treating Czar Ysaguirre/Extender: Yaakov Guthrie in Treatment: 0 Height (in): 63 Weight (lbs): 156 Body Mass Index (BMI): 27.6 Nutrition Risk Screening Items Score Screening NUTRITION RISK SCREEN: I have an illness or condition that made me change the kind and/or amount of food I eat 0 No I eat fewer than two meals per day 0 No I eat few fruits and vegetables, or milk products 0 No I have three or more drinks of beer, liquor or wine almost every day 0 No I have tooth or mouth problems that make it hard for me to eat 0 No I don't always have enough money to buy the food I need 0 No I eat alone most of the time 0 No I take three or more different prescribed or over-the-counter drugs a day 1 Yes Without wanting to, I have lost or gained 10 pounds in the last six months 0 No I am not always physically able to shop, cook and/or feed myself 0 No Nutrition Protocols Good Risk Protocol 0 No interventions needed Moderate Risk Protocol High Risk Proctocol Risk Level: Good Risk Score: 1 Electronic Signature(s) Signed: 11/22/2020 4:41:30 PM By: Dolan Amen RN Entered By: Dolan Amen on 11/22/2020 13:18:27

## 2020-11-22 NOTE — Telephone Encounter (Signed)
Patient called to request prescription for Ambien as discussed at her office visit this week.

## 2020-11-23 LAB — PROTEIN ELECTROPHORESIS, SERUM
A/G Ratio: 1.4 (ref 0.7–1.7)
Albumin ELP: 3.7 g/dL (ref 2.9–4.4)
Alpha-1-Globulin: 0.2 g/dL (ref 0.0–0.4)
Alpha-2-Globulin: 0.8 g/dL (ref 0.4–1.0)
Beta Globulin: 1.4 g/dL — ABNORMAL HIGH (ref 0.7–1.3)
Gamma Globulin: 0.3 g/dL — ABNORMAL LOW (ref 0.4–1.8)
Globulin, Total: 2.7 g/dL (ref 2.2–3.9)
M-Spike, %: 0.4 g/dL — ABNORMAL HIGH
Total Protein ELP: 6.4 g/dL (ref 6.0–8.5)

## 2020-11-24 ENCOUNTER — Other Ambulatory Visit: Payer: Self-pay

## 2020-11-24 ENCOUNTER — Inpatient Hospital Stay: Payer: Medicare Other

## 2020-11-24 VITALS — BP 97/49 | HR 83 | Temp 96.7°F | Resp 20 | Wt 159.0 lb

## 2020-11-24 DIAGNOSIS — Z5111 Encounter for antineoplastic chemotherapy: Secondary | ICD-10-CM | POA: Diagnosis not present

## 2020-11-24 DIAGNOSIS — C9000 Multiple myeloma not having achieved remission: Secondary | ICD-10-CM

## 2020-11-24 MED ORDER — DEXAMETHASONE 4 MG PO TABS
20.0000 mg | ORAL_TABLET | Freq: Once | ORAL | Status: AC
  Administered 2020-11-24: 20 mg via ORAL
  Filled 2020-11-24: qty 5

## 2020-11-24 MED ORDER — BORTEZOMIB CHEMO SQ INJECTION 3.5 MG (2.5MG/ML)
1.3000 mg/m2 | Freq: Once | INTRAMUSCULAR | Status: AC
  Administered 2020-11-24: 2.5 mg via SUBCUTANEOUS
  Filled 2020-11-24: qty 1

## 2020-11-24 NOTE — Progress Notes (Addendum)
Heather Peterson, Heather Peterson ([id number]) Visit Report for 11/22/2020 Chief Complaint Document Details Patient Name: Heather Peterson, Heather Peterson. Date of Service: 11/22/2020 12:30 PM Medical Record Number: [id number] Patient Account Number: [id number] Date of Birth/Sex: October 28, 1940 (80 y.o. F) Treating RN: Dolan Amen Primary Care Provider: Thereasa Distance Other Clinician: Referring Provider: Thereasa Distance Treating Provider/Extender: Yaakov Guthrie in Treatment: 0 Information Obtained from: Patient Chief Complaint Right hand wound Electronic Signature(s) Signed: 11/24/2020 3:55:23 PM By: Kalman Shan DO Entered By: Kalman Shan on 11/24/2020 15:46:25 Stai, Heather Peterson ([id number]) -------------------------------------------------------------------------------- Debridement Details Patient Name: Heather Peterson. Date of Service: 11/22/2020 12:30 PM Medical Record Number: [id number] Patient Account Number: [id number] Date of Birth/Sex: May 10, 1941 (80 y.o. F) Treating RN: Dolan Amen Primary Care Provider: Thereasa Distance Other Clinician: Referring Provider: Thereasa Distance Treating Provider/Extender: Yaakov Guthrie in Treatment: 0 Debridement Performed for Wound #1 Right Hand - Palm Assessment: Performed By: Physician Kalman Shan, MD Debridement Type: Chemical/Enzymatic/Mechanical Agent Used: Santyl Level of Consciousness (Pre- Awake and Alert procedure): Pre-procedure Verification/Time Out Yes - 13:38 Taken: Start Time: 13:38 Instrument: Other : cotton tipped applicator Bleeding: None Response to Treatment: Procedure was tolerated well Level of Consciousness (Post- Awake and Alert procedure): Post Debridement Measurements of Total Wound Length: (cm) 2 Width: (cm) 0.6 Depth: (cm) 0.3 Volume: (cm) 0.283 Character of Wound/Ulcer Post Debridement: Stable Post Procedure Diagnosis Same as Pre-procedure Electronic Signature(s) Signed: 11/22/2020  4:41:30 PM By: Dolan Amen RN Signed: 11/24/2020 3:55:23 PM By: Kalman Shan DO Entered By: Dolan Amen on 11/22/2020 13:39:50 Heather Peterson, Heather M. ([id number]) -------------------------------------------------------------------------------- HPI Details Patient Name: Heather Peterson. Date of Service: 11/22/2020 12:30 PM Medical Record Number: [id number] Patient Account Number: [id number] Date of Birth/Sex: 02-Feb-1941 (80 y.o. F) Treating RN: Dolan Amen Primary Care Provider: Thereasa Distance Other Clinician: Referring Provider: Thereasa Distance Treating Provider/Extender: Yaakov Guthrie in Treatment: 0 History of Present Illness HPI Description: Admission 7/13 Heather Peterson is a 80 year old female with a past medical history of type 2 diabetes on oral agents, COPD and CKD stage III that presents to the clinic for a right hand wound. She reports that 2 weeks ago she cut her hand on her walker. She visited the ED and they placed sutures. She returned to the ED 10 days later to have these removed. Unfortunately the area dehisced and at that time the skin flap had darkness along the edges. She has been using mupirocin cream on it daily. She currently denies signs of infection. Electronic Signature(s) Signed: 11/24/2020 3:55:23 PM By: Kalman Shan DO Entered By: Kalman Shan on 11/24/2020 15:50:53 Heather Peterson ([id number]) -------------------------------------------------------------------------------- Physical Exam Details Patient Name: Heather Peterson, Heather Peterson. Date of Service: 11/22/2020 12:30 PM Medical Record Number: [id number] Patient Account Number: [id number] Date of Birth/Sex: 1941/01/07 (80 y.o. F) Treating RN: Dolan Amen Primary Care Provider: Thereasa Distance Other Clinician: Referring Provider: Thereasa Distance Treating Provider/Extender: Yaakov Guthrie in Treatment: 0 Constitutional . Psychiatric . Notes Right hand: On  the palmar surface there is a curvilinear laceration with nonviable tissue on the surface and along the laceration line. There is no purulent drainage increased warmth or erythema to the area. Electronic Signature(s) Signed: 11/24/2020 3:55:23 PM By: Kalman Shan DO Entered By: Kalman Shan on 11/24/2020 15:52:18 Heather Peterson ([id number]) -------------------------------------------------------------------------------- Physician Orders Details Patient Name: [name], [name] Date of Service: 11/22/2020 12:30 PM Medical Record Number: [id number] Patient Account Number: [id number] Date of Birth/Sex: [birth year] (80 y.o. F) Treating RN: Dolan Amen Primary Care Provider: Thereasa Distance  Other Clinician: Referring Provider: Thereasa Distance Treating Provider/Extender: Yaakov Guthrie in Treatment: 0 Verbal / Phone Orders: No Diagnosis Coding Follow-up Appointments o Return Appointment in 1 week. Bathing/ Shower/ Hygiene o May shower; gently cleanse wound with antibacterial soap, rinse and pat dry prior to dressing wounds Wound Treatment Wound #1 - Hand - Palm Wound Laterality: Right Cleanser: Byram Ancillary Kit - 15 Day Supply (DME) (Generic) 1 x Per Day/15 Days Discharge Instructions: Use supplies as instructed; Kit contains: (15) Saline Bullets; (15) 3x3 Gauze; 15 pr Gloves Topical: Santyl Collagenase Ointment, 30 (gm), tube 1 x Per Day/15 Days Discharge Instructions: Apply nickel thick to wound bed only Primary Dressing: Non-Adherent Pad 3x8 (in/in) (DME) (Generic) 1 x Per Day/15 Days Discharge Instructions: Cover ointment to alleviate sticking. Secondary Dressing: Gauze (DME) (Generic) 1 x Per Day/15 Days Discharge Instructions: Cover non-adherent for extra padding Secondary Dressing: Kerlix 4.5 x 4.1 (in/yd) (DME) (Generic) 1 x Per Day/15 Days Discharge Instructions: Secure dressing with Kerlix Secured With: 52M Medipore H Soft Cloth Surgical Tape, 2x2  (in/yd) (DME) (Generic) 1 x Per Day/15 Days Discharge Instructions: Secure kerlix Patient Medications Allergies: Sulfa (Sulfonamide Antibiotics), Celebrex, glipizide, Levaquin, lisinopril, metformin, penicillin Notifications Medication Indication Start End Santyl 11/22/2020 DOSE 1 - topical 250 unit/gram ointment - Apply nickel thick to wound bed daily Electronic Signature(s) Signed: 11/22/2020 2:08:23 PM By: Dolan Amen RN Signed: 11/24/2020 3:55:23 PM By: Kalman Shan DO Previous Signature: 11/22/2020 2:00:48 PM Version By: Dolan Amen RN Previous Signature: 11/22/2020 1:59:03 PM Version By: Dolan Amen RN Entered By: Dolan Amen on 11/22/2020 14:08:22 Heather Peterson, Heather M. ([id number]) -------------------------------------------------------------------------------- Prescription 11/22/2020 Patient Name: Heather Peterson, Heather Peterson. Provider: Kalman Shan MD Date of Birth: November 15, 1940 NPI#: [id number] Sex: F DEA#: Phone #: [phone] License #: [id number] Patient Address: [facility] [place], [place] [address] [address] [place], [place] [address] [phone] Allergies Sulfa (Sulfonamide Antibiotics); Celebrex; glipizide; Levaquin; lisinopril; metformin; penicillin Medication Medication: Route: Strength: Form: Santyl 250 unit/gram topical ointment topical 250 unit/gram ointment Class: TOPICAL/MUCOUS MEMBR./SUBCUT. ENZYMES Dose: Frequency / Time: Indication: 1 Apply nickel thick to wound bed daily Number of Refills: Number of Units: 1 Thirty (30) Generic Substitution: Start Date: End Date: One Time Use: Do Not Substitute 11/22/2020 No Note to Pharmacy: Wound size 2 cm x 2 cm x 0.3 cm Hand Signature: Date(s): Electronic Signature(s) Signed: 11/22/2020 4:41:30 PM By: Dolan Amen RN Signed: 11/24/2020 3:55:23 PM By: Kalman Shan DO Entered By: Dolan Amen on  11/22/2020 14:08:23 [place], Heather Peterson ([id number]) --------------------------------------------------------------------------------  Problem List Details Patient Name: Heather Peterson, MATTO. Date of Service: 11/22/2020 12:30 PM Medical Record Number: [id number] Patient Account Number: [id number] Date of Birth/Sex: 07/08/1940 (80 y.o. F) Treating RN: Dolan Amen Primary Care Provider: Thereasa Distance Other Clinician: Referring Provider: Thereasa Distance Treating Provider/Extender: Yaakov Guthrie in Treatment: 0 Active Problems ICD-10 Encounter Code Description Active Date MDM Diagnosis T81.30XA Disruption of wound, unspecified, initial encounter 11/22/2020 No Yes S61.401A Unspecified open wound of right hand, initial encounter 11/22/2020 No Yes Inactive Problems Resolved Problems Electronic Signature(s) Signed: 11/24/2020 3:55:23 PM By: Kalman Shan DO Entered By: Kalman Shan on 11/24/2020 15:45:51 Heather Peterson, Heather M. ([id number]) -------------------------------------------------------------------------------- Progress Note Details Patient Name: Heather Peterson. Date of Service: 11/22/2020 12:30 PM Medical Record Number: [id number] Patient Account Number: [id number] Date of Birth/Sex: [birth year] (80 y.o. F) Treating RN: Dolan Amen Primary Care Provider: Thereasa Distance Other Clinician: Referring  Provider: Thereasa Distance Treating Provider/Extender: Yaakov Guthrie in Treatment: 0 Subjective Chief Complaint Information obtained from Patient Right hand wound History of Present Illness (HPI) Admission 7/13 Ms. Heather Peterson is a 80 year old female with a past medical history of type 2 diabetes on oral agents, COPD and CKD stage III that presents to the clinic for a right hand wound. She reports that 2 weeks ago she cut her hand on her walker. She visited the ED and they placed sutures. She returned to the ED 10 days later to have these  removed. Unfortunately the area dehisced and at that time the skin flap had darkness along the edges. She has been using mupirocin cream on it daily. She currently denies signs of infection. Patient History Information obtained from Patient. Allergies Celebrex, glipizide, Levaquin, lisinopril, Sulfa (Sulfonamide Antibiotics) (Severity: Severe), metformin, penicillin Social History Current every day smoker, Marital Status - Married, Alcohol Use - Never, Drug Use - No History, Caffeine Use - Daily. Medical History Eyes Patient has history of Cataracts Denies history of Glaucoma, Optic Neuritis Ear/Nose/Mouth/Throat Denies history of Chronic sinus problems/congestion, Middle ear problems Hematologic/Lymphatic Patient has history of Anemia Denies history of Hemophilia, Human Immunodeficiency Virus, Lymphedema, Sickle Cell Disease Respiratory Patient has history of Chronic Obstructive Pulmonary Disease (COPD) Denies history of Aspiration, Asthma, Pneumothorax, Sleep Apnea, Tuberculosis Cardiovascular Patient has history of Congestive Heart Failure, Hypertension, Myocardial Infarction Gastrointestinal Denies history of Cirrhosis , Colitis, Crohn s, Hepatitis A, Hepatitis B, Hepatitis C Endocrine Patient has history of Type II Diabetes Denies history of Type I Diabetes Genitourinary Patient has history of End Stage Renal Disease - CKD stage 3 Immunological Denies history of Lupus Erythematosus, Raynaud s, Scleroderma Integumentary (Skin) Denies history of History of Burn, History of pressure wounds Musculoskeletal Patient has history of Gout, Osteoarthritis Neurologic Patient has history of Neuropathy Denies history of Dementia, Quadriplegia, Paraplegia, Seizure Disorder Oncologic Denies history of Received Chemotherapy, Received Radiation Psychiatric Denies history of Anorexia/bulimia, Confinement Anxiety Patient is treated with Controlled Diet, Insulin. Blood sugar is not  tested. Medical And Surgical History Notes Oncologic Multiple myeloma Review of Systems (ROS) Constitutional Symptoms (General Health) Heather Peterson, Heather M. ([id number]) Complains or has symptoms of Fatigue. Denies complaints or symptoms of Fever, Chills, Marked Weight Change. Eyes Complains or has symptoms of Vision Changes, Glasses / Contacts. Denies complaints or symptoms of Dry Eyes. Ear/Nose/Mouth/Throat Denies complaints or symptoms of Difficult clearing ears, Sinusitis. Hematologic/Lymphatic Denies complaints or symptoms of Bleeding / Clotting Disorders, Human Immunodeficiency Virus. Respiratory Complains or has symptoms of Shortness of Breath. Denies complaints or symptoms of Chronic or frequent coughs. Cardiovascular Denies complaints or symptoms of Chest pain, LE edema. Gastrointestinal Denies complaints or symptoms of Frequent diarrhea, Nausea, Vomiting. Endocrine Complains or has symptoms of Thyroid disease. Denies complaints or symptoms of Hepatitis, Polydypsia (Excessive Thirst). Genitourinary Denies complaints or symptoms of Kidney failure/ Dialysis, Incontinence/dribbling. Immunological Denies complaints or symptoms of Hives, Itching. Integumentary (Skin) Denies complaints or symptoms of Wounds, Bleeding or bruising tendency, Breakdown, Swelling. Musculoskeletal Complains or has symptoms of Muscle Weakness. Denies complaints or symptoms of Muscle Pain. Neurologic Denies complaints or symptoms of Numbness/parasthesias, Focal/Weakness. Psychiatric Denies complaints or symptoms of Anxiety, Claustrophobia. Objective Constitutional Vitals Time Taken: 1:05 PM, Height: 63 in, Source: Stated, Weight: 156 lbs, Source: Measured, BMI: 27.6, Temperature: 98.3 F, Pulse: 89 bpm, Respiratory Rate: 18 breaths/min, Blood Pressure: 107/69 mmHg. General Notes: Right hand: On the palmar surface there is a curvilinear laceration with nonviable tissue on the surface and along  the  laceration line. There is no purulent drainage increased warmth or erythema to the area. Integumentary (Hair, Skin) Wound #1 status is Open. Original cause of wound was Skin Tear/Laceration. The date acquired was: 11/08/2020. The wound is located on the Right Hand - Palm. The wound measures 2cm length x 0.6cm width x 0.3cm depth; 0.942cm^2 area and 0.283cm^3 volume. There is Fat Layer (Subcutaneous Tissue) exposed. There is no tunneling or undermining noted. There is a medium amount of serosanguineous drainage noted. There is medium (34-66%) pink granulation within the wound bed. There is a medium (34-66%) amount of necrotic tissue within the wound bed including Adherent Slough. Assessment Active Problems ICD-10 Disruption of wound, unspecified, initial encounter Unspecified open wound of right hand, initial encounter Patient presents with a trauma wound status post wound dehiscence from suture placement in the ED. The area has nonviable tissue. It is tender to the touch and so I will not debride this today. I would like to use Santyl to see how much can be removed prior to sharp debridement. No signs of infection on exam. Heather Peterson, WARGO. ([id number]) 45 minutes was spent on the encounter including face-to-face and EMR review Procedures Wound #1 Pre-procedure diagnosis of Wound #1 is a Dehisced Wound located on the Right Hand - Palm . There was a Chemical/Enzymatic/Mechanical debridement performed by Kalman Shan, MD. With the following instrument(s): cotton tipped applicator. Agent used was [employer]. A time out was conducted at 13:38, prior to the start of the procedure. There was no bleeding. The procedure was tolerated well. Post Debridement Measurements: 2cm length x 0.6cm width x 0.3cm depth; 0.283cm^3 volume. Character of Wound/Ulcer Post Debridement is stable. Post procedure Diagnosis Wound #1: Same as Pre-Procedure Plan Follow-up Appointments: Return Appointment in 1  week. Bathing/ Shower/ Hygiene: May shower; gently cleanse wound with antibacterial soap, rinse and pat dry prior to dressing wounds The following medication(s) was prescribed: Santyl topical 250 unit/gram ointment 1 Apply nickel thick to wound bed daily starting 11/22/2020 WOUND #1: - Hand - Palm Wound Laterality: Right Cleanser: Byram Ancillary Kit - 15 Day Supply (DME) (Generic) 1 x Per Day/15 Days Discharge Instructions: Use supplies as instructed; Kit contains: (15) Saline Bullets; (15) 3x3 Gauze; 15 pr Gloves Topical: Santyl Collagenase Ointment, 30 (gm), tube 1 x Per Day/15 Days Discharge Instructions: Apply nickel thick to wound bed only Primary Dressing: Non-Adherent Pad 3x8 (in/in) (DME) (Generic) 1 x Per Day/15 Days Discharge Instructions: Cover ointment to alleviate sticking. Secondary Dressing: Gauze (DME) (Generic) 1 x Per Day/15 Days Discharge Instructions: Cover non-adherent for extra padding Secondary Dressing: Kerlix 4.5 x 4.1 (in/yd) (DME) (Generic) 1 x Per Day/15 Days Discharge Instructions: Secure dressing with Kerlix Secured With: 54M Medipore H Soft Cloth Surgical Tape, 2x2 (in/yd) (DME) (Generic) 1 x Per Day/15 Days Discharge Instructions: Secure kerlix 1. Santyl 2. Follow-up in 1 week Electronic Signature(s) Signed: 11/24/2020 3:55:23 PM By: Kalman Shan DO Entered By: Kalman Shan on 11/24/2020 15:54:49 Heather Peterson ([id number]) -------------------------------------------------------------------------------- ROS/PFSH Details Patient Name: Heather Peterson. Date of Service: 11/22/2020 12:30 PM Medical Record Number: [id number] Patient Account Number: [id number] Date of Birth/Sex: Apr 05, 1941 (80 y.o. F) Treating RN: Dolan Amen Primary Care Provider: Thereasa Distance Other Clinician: Referring Provider: Thereasa Distance Treating Provider/Extender: Yaakov Guthrie in Treatment: 0 Information Obtained From Patient Constitutional  Symptoms (General Health) Complaints and Symptoms: Positive for: Fatigue Negative for: Fever; Chills; Marked Weight Change Eyes Complaints and Symptoms: Positive for: Vision Changes; Glasses / Contacts Negative for: Dry  Eyes Medical History: Positive for: Cataracts Negative for: Glaucoma; Optic Neuritis Ear/Nose/Mouth/Throat Complaints and Symptoms: Negative for: Difficult clearing ears; Sinusitis Medical History: Negative for: Chronic sinus problems/congestion; Middle ear problems Hematologic/Lymphatic Complaints and Symptoms: Negative for: Bleeding / Clotting Disorders; Human Immunodeficiency Virus Medical History: Positive for: Anemia Negative for: Hemophilia; Human Immunodeficiency Virus; Lymphedema; Sickle Cell Disease Respiratory Complaints and Symptoms: Positive for: Shortness of Breath Negative for: Chronic or frequent coughs Medical History: Positive for: Chronic Obstructive Pulmonary Disease (COPD) Negative for: Aspiration; Asthma; Pneumothorax; Sleep Apnea; Tuberculosis Cardiovascular Complaints and Symptoms: Negative for: Chest pain; LE edema Medical History: Positive for: Congestive Heart Failure; Hypertension; Myocardial Infarction Gastrointestinal Complaints and Symptoms: Negative for: Frequent diarrhea; Nausea; Vomiting Medical History: Negative for: Cirrhosis ; Colitis; Crohnos; Hepatitis A; Hepatitis B; Hepatitis C Luhman, Odeal M. ([id number]) Endocrine Complaints and Symptoms: Positive for: Thyroid disease Negative for: Hepatitis; Polydypsia (Excessive Thirst) Medical History: Positive for: Type II Diabetes Negative for: Type I Diabetes Time with diabetes: 5 years Treated with: Insulin, Diet Blood sugar tested every day: No Genitourinary Complaints and Symptoms: Negative for: Kidney failure/ Dialysis; Incontinence/dribbling Medical History: Positive for: End Stage Renal Disease - CKD stage 3 Immunological Complaints and  Symptoms: Negative for: Hives; Itching Medical History: Negative for: Lupus Erythematosus; Raynaudos; Scleroderma Integumentary (Skin) Complaints and Symptoms: Negative for: Wounds; Bleeding or bruising tendency; Breakdown; Swelling Medical History: Negative for: History of Burn; History of pressure wounds Musculoskeletal Complaints and Symptoms: Positive for: Muscle Weakness Negative for: Muscle Pain Medical History: Positive for: Gout; Osteoarthritis Neurologic Complaints and Symptoms: Negative for: Numbness/parasthesias; Focal/Weakness Medical History: Positive for: Neuropathy Negative for: Dementia; Quadriplegia; Paraplegia; Seizure Disorder Psychiatric Complaints and Symptoms: Negative for: Anxiety; Claustrophobia Medical History: Negative for: Anorexia/bulimia; Confinement Anxiety Oncologic Medical History: Negative for: Received Chemotherapy; Received Radiation Past Medical History Notes: Multiple myeloma ASSIA, MEANOR. ([id number]) HBO Extended History Items Eyes: Cataracts Immunizations Pneumococcal Vaccine: Received Pneumococcal Vaccination: No Implantable Devices Yes Family and Social History Current every day smoker; Marital Status - Married; Alcohol Use: Never; Drug Use: No History; Caffeine Use: Daily Electronic Signature(s) Signed: 11/22/2020 4:41:30 PM By: Dolan Amen RN Signed: 11/24/2020 3:55:23 PM By: Kalman Shan DO Entered By: Dolan Amen on 11/22/2020 13:15:05 AZLYNN, MITNICK ([id number]) -------------------------------------------------------------------------------- [place] Details Patient Name: THEOLA, CUELLAR. Date of Service: 11/22/2020 Medical Record Number: [id number] Patient Account Number: [id number] Date of Birth/Sex: 1940/06/30 (80 y.o. F) Treating RN: Dolan Amen Primary Care Provider: Thereasa Distance Other Clinician: Referring Provider: Thereasa Distance Treating Provider/Extender: Yaakov Guthrie in Treatment: 0 Diagnosis Coding ICD-10 Codes Code Description T81.30XA Disruption of wound, unspecified, initial encounter S61.401A Unspecified open wound of right hand, initial encounter Facility Procedures CPT4 Code: [id number] Description: [id number] - WOUND CARE VISIT-LEV 2 EST PT Modifier: Quantity: 1 CPT4 Code: [id number] Description: 37482 - DEBRIDE W/O ANES NON SELECT Modifier: Quantity: 1 Physician Procedures CPT4 Code: 7078675 Description: 44920 - WC PHYS LEVEL 4 - NEW PT Modifier: Quantity: 1 CPT4 Code: Description: ICD-10 Diagnosis Description T81.30XA Disruption of wound, unspecified, initial encounter S61.401A Unspecified open wound of right hand, initial encounter Modifier: Quantity: Electronic Signature(s) Signed: 11/24/2020 3:55:23 PM By: Kalman Shan DO Previous Signature: 11/22/2020 3:01:19 PM Version By: Dolan Amen RN Entered By: Kalman Shan on 11/24/2020 15:55:01

## 2020-11-24 NOTE — Patient Instructions (Signed)
[facility]  Discharge Instructions: Thank you for choosing [facility] to provide your oncology and hematology care.  If you have a lab appointment with the [facility], please go directly to the [facility] and check in at the registration area.  Wear comfortable clothing and clothing appropriate for easy access to any Portacath or PICC line.   We strive to give you quality time with your provider. You may need to reschedule your appointment if you arrive late (15 or more minutes).  Arriving late affects you and other patients whose appointments are after yours.  Also, if you miss three or more appointments without notifying the office, you may be dismissed from the clinic at the provider's discretion.      For prescription refill requests, have your pharmacy contact our office and allow 72 hours for refills to be completed.    Today you received the following chemotherapy and/or immunotherapy agents: Velcade       To help prevent nausea and vomiting after your treatment, we encourage you to take your nausea medication as directed.  BELOW ARE SYMPTOMS THAT SHOULD BE REPORTED IMMEDIATELY: *FEVER GREATER THAN 100.4 F (38 C) OR HIGHER *CHILLS OR SWEATING *NAUSEA AND VOMITING THAT IS NOT CONTROLLED WITH YOUR NAUSEA MEDICATION *UNUSUAL SHORTNESS OF BREATH *UNUSUAL BRUISING OR BLEEDING *URINARY PROBLEMS (pain or burning when urinating, or frequent urination) *BOWEL PROBLEMS (unusual diarrhea, constipation, pain near the anus) TENDERNESS IN MOUTH AND THROAT WITH OR WITHOUT PRESENCE OF ULCERS (sore throat, sores in mouth, or a toothache) UNUSUAL RASH, SWELLING OR PAIN  UNUSUAL VAGINAL DISCHARGE OR ITCHING   Items with * indicate a potential emergency and should be followed up as soon as possible or go to the Emergency Department if any problems should occur.  Please show the CHEMOTHERAPY ALERT CARD or IMMUNOTHERAPY ALERT CARD at check-in  to the Emergency Department and triage nurse.  Should you have questions after your visit or need to cancel or reschedule your appointment, please contact [facility]  [phone] and follow the prompts.  Office hours are 8:00 a.m. to 4:30 p.m. Monday - Friday. Please note that voicemails left after 4:00 p.m. may not be returned until the following business day.  We are closed weekends and major holidays. You have access to a nurse at all times for urgent questions. Please call the main number to the clinic [phone] and follow the prompts.  For any non-urgent questions, you may also contact your provider using MyChart. We now offer e-Visits for anyone 95 and older to request care online for non-urgent symptoms. For details visit [email or web address].   Also download the MyChart app! Go to the app store, search "MyChart", open the app, select [facility], and log in with your MyChart username and password.  Due to Covid, a mask is required upon entering the hospital/clinic. If you do not have a mask, one will be given to you upon arrival. For doctor visits, patients may have 1 support person aged 34 or older with them. For treatment visits, patients cannot have anyone with them due to current Covid guidelines and our immunocompromised population. Bortezomib injection What is this medication? BORTEZOMIB (bor TEZ oh mib) targets proteins in cancer cells and stops thecancer cells from growing. It treats multiple myeloma and mantle cell lymphoma. This medicine may be used for other purposes; ask your health care provider orpharmacist if you have questions. COMMON BRAND NAME(S): Velcade What should I tell  my care team before I take this medication? They need to know if you have any of these conditions: dehydration diabetes (high blood sugar) heart disease liver disease tingling of the fingers or toes or other nerve disorder an unusual or allergic reaction to  bortezomib, mannitol, boron, other medicines, foods, dyes, or preservatives pregnant or trying to get pregnant breast-feeding How should I use this medication? This medicine is injected into a vein or under the skin. It is given by ahealth care provider in a hospital or clinic setting. Talk to your health care provider about the use of this medicine in children.Special care may be needed. Overdosage: If you think you have taken too much of this medicine contact apoison control center or emergency room at once. NOTE: This medicine is only for you. Do not share this medicine with others. What if I miss a dose? Keep appointments for follow-up doses. It is important not to miss your dose.Call your health care provider if you are unable to keep an appointment. What may interact with this medication? This medicine may interact with the following medications: ketoconazole rifampin This list may not describe all possible interactions. Give your health care provider a list of all the medicines, herbs, non-prescription drugs, or dietary supplements you use. Also tell them if you smoke, drink alcohol, or use illegaldrugs. Some items may interact with your medicine. What should I watch for while using this medication? Your condition will be monitored carefully while you are receiving thismedicine. You may need blood work done while you are taking this medicine. You may get drowsy or dizzy. Do not drive, use machinery, or do anything that needs mental alertness until you know how this medicine affects you. Do not stand up or sit up quickly, especially if you are an older patient. Thisreduces the risk of dizzy or fainting spells This medicine may increase your risk of getting an infection. Call your health care provider for advice if you get a fever, chills, sore throat, or other symptoms of a cold or flu. Do not treat yourself. Try to avoid being aroundpeople who are sick. Check with your health care provider  if you have severe diarrhea, nausea, and vomiting, or if you sweat a lot. The loss of too much body fluid may make itdangerous for you to take this medicine. Do not become pregnant while taking this medicine or for 7 months after stopping it. Women should inform their health care provider if they wish to become pregnant or think they might be pregnant. Men should not father a child while taking this medicine and for 4 months after stopping it. There is a potential for serious harm to an unborn child. Talk to your health care provider for more information. Do not breast-feed an infant while taking thismedicine or for 2 months after stopping it. This medicine may make it more difficult to get pregnant or father a child.Talk to your health care provider if you are concerned about your fertility. What side effects may I notice from receiving this medication? Side effects that you should report to your doctor or health care professionalas soon as possible: allergic reactions (skin rash; itching or hives; swelling of the face, lips, or tongue) bleeding (bloody or black, tarry stools; red or dark brown urine; spitting up blood or brown material that looks like coffee grounds; red spots on the skin; unusual bruising or bleeding from the eye, gums, or nose) blurred vision or changes in vision confusion constipation headache heart failure (trouble  breathing; fast, irregular heartbeat; sudden weight gain; swelling of the ankles, feet, hands) infection (fever, chills, cough, sore throat, pain or trouble passing urine) lack or loss of appetite liver injury (dark yellow or brown urine; general ill feeling or flu-like symptoms; loss of appetite, right upper belly pain; yellowing of the eyes or skin) low blood pressure (dizziness; feeling faint or lightheaded, falls; unusually weak or tired) muscle cramps pain, redness, or irritation at site where injected pain, tingling, numbness in the hands or  feet seizures trouble breathing unusual bruising or bleeding Side effects that usually do not require medical attention (report to yourdoctor or health care professional if they continue or are bothersome): diarrhea nausea stomach pain trouble sleeping vomiting This list may not describe all possible side effects. Call your doctor for medical advice about side effects. You may report side effects to FDA at1-800-FDA-1088. Where should I keep my medication? This medicine is given in a hospital or clinic. It will not be stored at home. NOTE: This sheet is a summary. It may not cover all possible information. If you have questions about this medicine, talk to your doctor, pharmacist, orhealth care provider.  2022 Elsevier/Gold Standard (2020-04-20 13:22:53)

## 2020-11-27 ENCOUNTER — Other Ambulatory Visit: Payer: Self-pay

## 2020-11-27 ENCOUNTER — Ambulatory Visit: Payer: Medicare Other | Admitting: Physician Assistant

## 2020-11-27 ENCOUNTER — Encounter: Payer: Medicare Other | Admitting: Physician Assistant

## 2020-11-27 DIAGNOSIS — T8133XA Disruption of traumatic injury wound repair, initial encounter: Secondary | ICD-10-CM | POA: Diagnosis not present

## 2020-11-27 NOTE — Progress Notes (Addendum)
Heather Peterson, Heather Peterson ([id number]) Visit Report for 11/27/2020 Arrival Information Details Patient Name: Heather Peterson, Heather Peterson. Date of Service: 11/27/2020 8:15 AM Medical Record Number: [id number] Patient Account Number: [id number] Date of Birth/Sex: [birth year] (80 y.o. F) Treating RN: Dolan Amen Primary Care Edrik Rundle: Thereasa Distance Other Clinician: Referring Laveta Gilkey: Thereasa Distance Treating Serene Kopf/Extender: Skipper Cliche in Treatment: 0 Visit Information History Since Last Visit Has Dressing in Place as Prescribed: Yes Patient Arrived: Walker Pain Present Now: No Arrival Time: 08:15 Accompanied By: husband Transfer Assistance: None Patient Identification Verified: Yes Secondary Verification Process Completed: Yes Patient Has Alerts: Yes Patient Alerts: Type II Diabetic PACEMAKER Electronic Signature(s) Signed: 11/27/2020 10:03:57 AM By: Dolan Amen RN Entered By: Dolan Amen on 11/27/2020 10:03:57 Heather Peterson ([id number]) -------------------------------------------------------------------------------- Clinic Level of Care Assessment Details Patient Name: Heather Peterson. Date of Service: 11/27/2020 8:15 AM Medical Record Number: [id number] Patient Account Number: [id number] Date of Birth/Sex: Jan 28, 1941 (80 y.o. F) Treating RN: Dolan Amen Primary Care Master Touchet: Thereasa Distance Other Clinician: Referring Rakhi Romagnoli: Thereasa Distance Treating Liboria Putnam/Extender: Skipper Cliche in Treatment: 0 Clinic Level of Care Assessment Items TOOL 1 Quantity Score []  - Use when EandM and Procedure is performed on INITIAL visit 0 ASSESSMENTS - Nursing Assessment / Reassessment []  - General Physical Exam (combine w/ comprehensive assessment (listed just below) when performed on new 0 pt. evals) []  - 0 Comprehensive Assessment (HX, ROS, Risk Assessments, Wounds Hx, etc.) ASSESSMENTS - Wound and Skin Assessment / Reassessment []  - Dermatologic / Skin  Assessment (not related to wound area) 0 ASSESSMENTS - Ostomy and/or Continence Assessment and Care []  - Incontinence Assessment and Management 0 []  - 0 Ostomy Care Assessment and Management (repouching, etc.) PROCESS - Coordination of Care []  - Simple Patient / Family Education for ongoing care 0 []  - 0 Complex (extensive) Patient / Family Education for ongoing care []  - 0 Staff obtains Programmer, systems, Records, Test Results / Process Orders []  - 0 Staff telephones HHA, Nursing Homes / Clarify orders / etc []  - 0 Routine Transfer to another Facility (non-emergent condition) []  - 0 Routine Hospital Admission (non-emergent condition) []  - 0 New Admissions / Biomedical engineer / Ordering NPWT, Apligraf, etc. []  - 0 Emergency Hospital Admission (emergent condition) PROCESS - Special Needs []  - Pediatric / Minor Patient Management 0 []  - 0 Isolation Patient Management []  - 0 Hearing / Language / Visual special needs []  - 0 Assessment of Community assistance (transportation, D/C planning, etc.) []  - 0 Additional assistance / Altered mentation []  - 0 Support Surface(s) Assessment (bed, cushion, seat, etc.) INTERVENTIONS - Miscellaneous []  - External ear exam 0 []  - 0 Patient Transfer (multiple staff / Civil Service fast streamer / Similar devices) []  - 0 Simple Staple / Suture removal (25 or less) []  - 0 Complex Staple / Suture removal (26 or more) []  - 0 Hypo/Hyperglycemic Management (do not check if billed separately) []  - 0 Ankle / Brachial Index (ABI) - do not check if billed separately Has the patient been seen at the hospital within the last three years: Yes Total Score: 0 Level Of Care: ____ Heather Peterson ([id number]) Electronic Signature(s) Signed: 11/27/2020 2:04:47 PM By: Dolan Amen RN Entered By: Dolan Amen on 11/27/2020 10:14:03 Heather Peterson ([id number]) -------------------------------------------------------------------------------- Encounter Discharge  Information Details Patient Name: Heather Peterson, Heather Peterson. Date of Service: 11/27/2020 8:15 AM Medical Record Number: [id number] Patient Account Number: [id number] Date of Birth/Sex: 1941/05/05 (80 y.o. F) Treating RN: Dolan Amen Primary Care Kalesha Irving: Ellison Hughs,  Quita Skye Other Clinician: Referring Khalaya Mcgurn: Thereasa Distance Treating Banyan Goodchild/Extender: Skipper Cliche in Treatment: 0 Encounter Discharge Information Items Post Procedure Vitals Discharge Condition: Stable Temperature (F): 97.6 Ambulatory Status: Walker Pulse (bpm): 79 Discharge Destination: Home Respiratory Rate (breaths/min): 18 Transportation: Private Auto Blood Pressure (mmHg): 106/70 Accompanied By: husband Schedule Follow-up Appointment: Yes Clinical Summary of Care: Electronic Signature(s) Signed: 11/27/2020 10:15:10 AM By: Dolan Amen RN Entered By: Dolan Amen on 11/27/2020 10:15:09 Heather Peterson ([id number]) -------------------------------------------------------------------------------- Lower Extremity Assessment Details Patient Name: Heather Peterson, Heather Peterson. Date of Service: 11/27/2020 8:15 AM Medical Record Number: [id number] Patient Account Number: [id number] Date of Birth/Sex: 01/12/1941 (80 y.o. F) Treating RN: Dolan Amen Primary Care Graysin Luczynski: Thereasa Distance Other Clinician: Referring Coner Gibbard: Thereasa Distance Treating Breelle Hollywood/Extender: Jeri Cos Weeks in Treatment: 0 Electronic Signature(s) Signed: 11/27/2020 10:05:21 AM By: Dolan Amen RN Entered By: Dolan Amen on 11/27/2020 10:05:21 Heather Peterson, Heather Peterson ([id number]) -------------------------------------------------------------------------------- Multi Wound Chart Details Patient Name: Heather Peterson, Heather Peterson. Date of Service: 11/27/2020 8:15 AM Medical Record Number: [id number] Patient Account Number: [id number] Date of Birth/Sex: 1940-09-10 (80 y.o. F) Treating RN: Dolan Amen Primary Care Ashaunte Standley: Thereasa Distance  Other Clinician: Referring Marybel Alcott: Thereasa Distance Treating Olawale Marney/Extender: Skipper Cliche in Treatment: 0 Vital Signs Height(in): 63 Pulse(bpm): 79 Weight(lbs): 156 Blood Pressure(mmHg): 106/70 Body Mass Index(BMI): 28 Temperature(F): 97.6 Respiratory Rate(breaths/min): 18 Photos: [N/A:N/A] Wound Location: Right Hand - Palm N/A N/A Wounding Event: Skin Tear/Laceration N/A N/A Primary Etiology: Dehisced Wound N/A N/A Comorbid History: Cataracts, Anemia, Chronic N/A N/A Obstructive Pulmonary Disease (COPD), Congestive Heart Failure, Hypertension, Myocardial Infarction, Type II Diabetes, End Stage Renal Disease, Gout, Osteoarthritis, Neuropathy Date Acquired: 11/08/2020 N/A N/A Weeks of Treatment: 0 N/A N/A Wound Status: Open N/A N/A Measurements L x W x D (cm) 2.6x0.5x0.3 N/A N/A Area (cm) : 1.021 N/A N/A Volume (cm) : 0.306 N/A N/A % Reduction in Area: -8.40% N/A N/A % Reduction in Volume: -8.10% N/A N/A Classification: Full Thickness Without Exposed N/A N/A Support Structures Exudate Amount: Medium N/A N/A Exudate Type: Serosanguineous N/A N/A Exudate Color: red, brown N/A N/A Granulation Amount: Medium (34-66%) N/A N/A Granulation Quality: Pink N/A N/A Necrotic Amount: Medium (34-66%) N/A N/A Exposed Structures: Fat Layer (Subcutaneous Tissue): N/A N/A Yes Fascia: No Tendon: No Muscle: No Joint: No Bone: No Epithelialization: None N/A N/A Treatment Notes Electronic Signature(s) Signed: 11/27/2020 10:05:34 AM By: Dolan Amen RN Entered By: Dolan Amen on 11/27/2020 10:05:34 Heather Peterson, Heather Peterson ([id number]) [place], [name] ([id number]) -------------------------------------------------------------------------------- Multi-Disciplinary Care Plan Details Patient Name: Heather Peterson, Heather Peterson. Date of Service: 11/27/2020 8:15 AM Medical Record Number: [id number] Patient Account Number: [id number] Date of Birth/Sex: 12-12-1940 (80 y.o.  F) Treating RN: Dolan Amen Primary Care Hendrixx Severin: Thereasa Distance Other Clinician: Referring Lyndsie Wallman: Thereasa Distance Treating Kayman Snuffer/Extender: Skipper Cliche in Treatment: 0 Active Inactive Necrotic Tissue Nursing Diagnoses: Impaired tissue integrity related to necrotic/devitalized tissue Goals: Necrotic/devitalized tissue will be minimized in the wound bed Date Initiated: 11/22/2020 Target Resolution Date: 11/22/2020 Goal Status: Active Patient/caregiver will verbalize understanding of reason and process for debridement of necrotic tissue Date Initiated: 11/22/2020 Target Resolution Date: 11/22/2020 Goal Status: Active Interventions: Assess patient pain level pre-, during and post procedure and prior to discharge Provide education on necrotic tissue and debridement process Treatment Activities: Apply topical anesthetic as ordered : 11/22/2020 Biologic debridement : 11/22/2020 Enzymatic debridement : 11/22/2020 Excisional debridement : 11/22/2020 Notes: Wound/Skin Impairment Nursing Diagnoses: Impaired tissue integrity Goals: Patient/caregiver will verbalize understanding of skin care regimen  Date Initiated: 11/22/2020 Target Resolution Date: 11/22/2020 Goal Status: Active Ulcer/skin breakdown will have a volume reduction of 30% by week 4 Date Initiated: 11/22/2020 Target Resolution Date: 12/23/2020 Goal Status: Active Ulcer/skin breakdown will have a volume reduction of 50% by week 8 Date Initiated: 11/22/2020 Target Resolution Date: 01/23/2021 Goal Status: Active Ulcer/skin breakdown will have a volume reduction of 80% by week 12 Date Initiated: 11/22/2020 Target Resolution Date: 02/22/2021 Goal Status: Active Ulcer/skin breakdown will heal within 14 weeks Date Initiated: 11/22/2020 Target Resolution Date: 03/25/2021 Goal Status: Active Interventions: Assess patient/caregiver ability to obtain necessary supplies Assess patient/caregiver ability to perform  ulcer/skin care regimen upon admission and as needed Assess ulceration(s) every visit Provide education on ulcer and skin care Heather Peterson, Heather Peterson ([id number]) Treatment Activities: Referred to DME Avaleen Brownley for dressing supplies : 11/22/2020 Skin care regimen initiated : 11/22/2020 Notes: Electronic Signature(s) Signed: 11/27/2020 10:05:27 AM By: Dolan Amen RN Entered By: Dolan Amen on 11/27/2020 10:05:27 Heather Peterson ([id number]) -------------------------------------------------------------------------------- Pain Assessment Details Patient Name: Heather Peterson. Date of Service: 11/27/2020 8:15 AM Medical Record Number: [id number] Patient Account Number: [id number] Date of Birth/Sex: 09/15/1940 (80 y.o. F) Treating RN: Dolan Amen Primary Care Maleia Weems: Thereasa Distance Other Clinician: Referring Joden Bonsall: Thereasa Distance Treating Aryelle Figg/Extender: Skipper Cliche in Treatment: 0 Active Problems Location of Pain Severity and Description of Pain Patient Has Paino No Site Locations Rate the pain. Current Pain Level: 0 Pain Management and Medication Current Pain Management: Electronic Signature(s) Signed: 11/27/2020 10:04:37 AM By: Dolan Amen RN Entered By: Dolan Amen on 11/27/2020 10:04:37 Heather Peterson ([id number]) -------------------------------------------------------------------------------- Patient/Caregiver Education Details Patient Name: Heather Peterson, Heather Peterson. Date of Service: 11/27/2020 8:15 AM Medical Record Number: [id number] Patient Account Number: [id number] Date of Birth/Gender: 1940-10-10 (80 y.o. F) Treating RN: Dolan Amen Primary Care Physician: Thereasa Distance Other Clinician: Referring Physician: Thereasa Distance Treating Physician/Extender: Skipper Cliche in Treatment: 0 Education Assessment Education Provided To: Patient Education Topics Provided Wound Debridement: Methods: Explain/Verbal Responses: State  content correctly Wound/Skin Impairment: Methods: Explain/Verbal Responses: State content correctly Electronic Signature(s) Signed: 11/27/2020 2:04:47 PM By: Dolan Amen RN Entered By: Dolan Amen on 11/27/2020 10:14:24 Heather Peterson ([id number]) -------------------------------------------------------------------------------- Wound Assessment Details Patient Name: Heather Peterson, Heather Peterson. Date of Service: 11/27/2020 8:15 AM Medical Record Number: [id number] Patient Account Number: [id number] Date of Birth/Sex: 09-Aug-1940 (80 y.o. F) Treating RN: Dolan Amen Primary Care Tinika Bucknam: Thereasa Distance Other Clinician: Referring Jahni Paul: Thereasa Distance Treating Thaddaeus Granja/Extender: Jeri Cos Weeks in Treatment: 0 Wound Status Wound Number: 1 Primary Dehisced Wound Etiology: Wound Location: Right Hand - Palm Wound Open Wounding Event: Skin Tear/Laceration Status: Date Acquired: 11/08/2020 Comorbid Cataracts, Anemia, Chronic Obstructive Pulmonary Disease Weeks Of Treatment: 0 History: (COPD), Congestive Heart Failure, Hypertension, Clustered Wound: No Myocardial Infarction, Type II Diabetes, End Stage Renal Disease, Gout, Osteoarthritis, Neuropathy Photos Wound Measurements Length: (cm) 2.6 Width: (cm) 0.5 Depth: (cm) 0.3 Area: (cm) 1.021 Volume: (cm) 0.306 % Reduction in Area: -8.4% % Reduction in Volume: -8.1% Epithelialization: None Tunneling: No Undermining: No Wound Description Classification: Full Thickness Without Exposed Support Structures Exudate Amount: Medium Exudate Type: Serosanguineous Exudate Color: red, brown Foul Odor After Cleansing: No Slough/Fibrino Yes Wound Bed Granulation Amount: Medium (34-66%) Exposed Structure Granulation Quality: Pink Fascia Exposed: No Necrotic Amount: Medium (34-66%) Fat Layer (Subcutaneous Tissue) Exposed: Yes Necrotic Quality: Adherent Slough Tendon Exposed: No Muscle Exposed: No Joint Exposed: No Bone  Exposed: No Treatment Notes Wound #1 (Hand - Palm) Wound Laterality: Right  Cleanser Normal Saline Discharge Instruction: Wash your hands with soap and water. Remove old dressing, discard into plastic bag and place into trash. Cleanse the wound with Normal Saline prior to applying a clean dressing using gauze sponges, not tissues or cotton balls. Do not Mcgurn, [place] ([id number]) scrub or use excessive force. Pat dry using gauze sponges, not tissue or cotton balls. Peri-Wound Care Topical Primary Dressing Prisma 4.34 (in) Discharge Instruction: Moisten w/normal saline or sterile water; Cover wound as directed. Do not remove from wound bed. Zetuvit Plus Silicone Border Dressing 4x4 (in/in) Discharge Instruction: Cover wound Secondary Dressing Secured With Compression Wrap Compression Stockings Add-Ons Electronic Signature(s) Signed: 11/27/2020 10:05:04 AM By: Dolan Amen RN Entered By: Dolan Amen on 11/27/2020 10:05:04 Heather Peterson ([id number]) -------------------------------------------------------------------------------- [facility] Details Patient Name: Heather Peterson. Date of Service: 11/27/2020 8:15 AM Medical Record Number: [id number] Patient Account Number: [id number] Date of Birth/Sex: 1940-06-10 (80 y.o. F) Treating RN: Dolan Amen Primary Care Perian Tedder: Thereasa Distance Other Clinician: Referring Seda Kronberg: Thereasa Distance Treating Halley Kincer/Extender: Skipper Cliche in Treatment: 0 Vital Signs Time Taken: 08:20 Temperature (F): 97.6 Height (in): 63 Pulse (bpm): 79 Weight (lbs): 156 Respiratory Rate (breaths/min): 18 Body Mass Index (BMI): 27.6 Blood Pressure (mmHg): 106/70 Reference Range: 80 - 120 mg / dl Electronic Signature(s) Signed: 11/27/2020 10:04:31 AM By: Dolan Amen RN Entered By: Dolan Amen on 11/27/2020 10:04:30

## 2020-11-27 NOTE — Progress Notes (Signed)
Heather Peterson ([id number]) Visit Report for 11/27/2020 Chief Complaint Document Details Patient Name: Heather Peterson. Date of Service: 11/27/2020 8:15 AM Medical Record Number: [id number] Patient Account Number: [id number] Date of Birth/Sex: 1940/12/10 (80 y.o. F) Treating RN: Dolan Amen Primary Care Provider: Thereasa Distance Other Clinician: Referring Provider: Thereasa Distance Treating Provider/Extender: Skipper Cliche in Treatment: 0 Information Obtained from: Patient Chief Complaint Right hand wound Electronic Signature(s) Signed: 11/27/2020 1:35:30 PM By: Worthy Keeler PA-C Entered By: Worthy Keeler on 11/27/2020 13:35:30 Heather Peterson ([id number]) -------------------------------------------------------------------------------- Debridement Details Patient Name: Heather Peterson. Date of Service: 11/27/2020 8:15 AM Medical Record Number: [id number] Patient Account Number: [id number] Date of Birth/Sex: 08-Apr-1941 (80 y.o. F) Treating RN: Dolan Amen Primary Care Provider: Thereasa Distance Other Clinician: Referring Provider: Thereasa Distance Treating Provider/Extender: Jeri Cos Weeks in Treatment: 0 Debridement Performed for Wound #1 Right Hand - Palm Assessment: Performed By: Physician Tommie Sams., PA-C Debridement Type: Debridement Level of Consciousness (Pre- Awake and Alert procedure): Pre-procedure Verification/Time Out Yes - 08:33 Taken: Start Time: 08:33 Pain Control: Lidocaine Injectable : 2 Total Area Debrided (L x W): 2.6 (cm) x 0.5 (cm) = 1.3 (cm) Tissue and other material Viable, Non-Viable, Subcutaneous, Skin: Dermis , Skin: Epidermis debrided: Level: Skin/Subcutaneous Tissue Debridement Description: Excisional Instrument: Curette, Forceps, Scissors Bleeding: Moderate Hemostasis Achieved: Pressure Response to Treatment: Procedure was tolerated well Level of Consciousness (Post- Awake and Alert procedure): Post  Debridement Measurements of Total Wound Length: (cm) 2.6 Width: (cm) 0.5 Depth: (cm) 0.4 Volume: (cm) 0.408 Character of Wound/Ulcer Post Debridement: Stable Post Procedure Diagnosis Same as Pre-procedure Electronic Signature(s) Signed: 11/27/2020 10:07:31 AM By: Dolan Amen RN Signed: 11/27/2020 5:02:09 PM By: Worthy Keeler PA-C Entered By: Dolan Amen on 11/27/2020 10:07:30 Heather Peterson ([id number]) -------------------------------------------------------------------------------- HPI Details Patient Name: Heather Peterson. Date of Service: 11/27/2020 8:15 AM Medical Record Number: [id number] Patient Account Number: [id number] Date of Birth/Sex: Mar 12, 1941 (80 y.o. F) Treating RN: Dolan Amen Primary Care Provider: Thereasa Distance Other Clinician: Referring Provider: Thereasa Distance Treating Provider/Extender: Skipper Cliche in Treatment: 0 History of Present Illness HPI Description: Admission 7/13 Heather Peterson is a 80 year old female with a past medical history of type 2 diabetes on oral agents, COPD and CKD stage III that presents to the clinic for a right hand wound. She reports that 2 weeks ago she cut her hand on her walker. She visited the ED and they placed sutures. She returned to the ED 10 days later to have these removed. Unfortunately the area dehisced and at that time the skin flap had darkness along the edges. She has been using mupirocin cream on it daily. She currently denies signs of infection. 11/27/20 upon evaluation today patient appears for reevaluation here in the clinic due to issues she's been having with the laceration on the palm of her hand immediately. With that being said there is a basically crescent laceration noted here there is a portion of the flap which is actually to chronic and this is going to need to be cleaned away based on what I see. Fortunately there does not appear to be any evidence of active infection which  is great news she does have quite a bit of pain however. Initially we were hoping to be able to debride some this way without having to know her but eventually did have to use lidocaine to numb the area. Electronic Signature(s) Signed: 11/27/2020 5:02:09 PM By: Worthy Keeler PA-C Entered  By: Worthy Keeler on 11/27/2020 16:51:18 Heather Peterson ([id number]) -------------------------------------------------------------------------------- Physical Exam Details Patient Name: Heather Peterson. Date of Service: 11/27/2020 8:15 AM Medical Record Number: [id number] Patient Account Number: [id number] Date of Birth/Sex: 1941-02-11 (80 y.o. F) Treating RN: Dolan Amen Primary Care Provider: Thereasa Distance Other Clinician: Referring Provider: Thereasa Distance Treating Provider/Extender: Jeri Cos Weeks in Treatment: 0 Constitutional Well-nourished and well-hydrated in no acute distress. Respiratory normal breathing without difficulty. Psychiatric this patient is able to make decisions and demonstrates good insight into disease process. Alert and Oriented x 3. pleasant and cooperative. Notes After using 5 mL of 2% lidocaine to numb the region circumferentially I then proceeded to clean away the necrotic portion of the flap which the patient tolerated today with no pain at that point. I was able to get down to get tissue pretty much on all fronts and in the end the wound did appear to be doing much better. There is no signs of any remaining necrotic tissue at this point. Electronic Signature(s) Signed: 11/27/2020 5:02:09 PM By: Worthy Keeler PA-C Entered By: Worthy Keeler on 11/27/2020 16:51:30 Heather Peterson ([id number]) -------------------------------------------------------------------------------- Physician Orders Details Patient Name: Heather Peterson. Date of Service: 11/27/2020 8:15 AM Medical Record Number: [id number] Patient Account Number: [id number] Date of  Birth/Sex: 1940/12/31 (80 y.o. F) Treating RN: Dolan Amen Primary Care Provider: Thereasa Distance Other Clinician: Referring Provider: Thereasa Distance Treating Provider/Extender: Skipper Cliche in Treatment: 0 Verbal / Phone Orders: No Diagnosis Coding Follow-up Appointments o Return Appointment in 1 week. Bathing/ Shower/ Hygiene o May shower; gently cleanse wound with antibacterial soap, rinse and pat dry prior to dressing wounds Wound Treatment Wound #1 - Hand - Palm Wound Laterality: Right Cleanser: Normal Saline Every Other Day/30 Days Discharge Instructions: Wash your hands with soap and water. Remove old dressing, discard into plastic bag and place into trash. Cleanse the wound with Normal Saline prior to applying a clean dressing using gauze sponges, not tissues or cotton balls. Do not scrub or use excessive force. Pat dry using gauze sponges, not tissue or cotton balls. Primary Dressing: Prisma 4.34 (in) Every Other Day/30 Days Discharge Instructions: Moisten w/normal saline or sterile water; Cover wound as directed. Do not remove from wound bed. Primary Dressing: Zetuvit Plus Silicone Border Dressing 4x4 (in/in) (DME) (Generic) Every Other Day/30 Days Discharge Instructions: Cover wound Electronic Signature(s) Signed: 11/27/2020 10:13:59 AM By: Dolan Amen RN Signed: 11/27/2020 5:02:09 PM By: Worthy Keeler PA-C Entered By: Dolan Amen on 11/27/2020 10:13:58 Heather Peterson ([id number]) -------------------------------------------------------------------------------- Problem List Details Patient Name: SHARICE, HARRISS. Date of Service: 11/27/2020 8:15 AM Medical Record Number: [id number] Patient Account Number: [id number] Date of Birth/Sex: 02/19/1941 (80 y.o. F) Treating RN: Dolan Amen Primary Care Provider: Thereasa Distance Other Clinician: Referring Provider: Thereasa Distance Treating Provider/Extender: Skipper Cliche in Treatment:  0 Active Problems ICD-10 Encounter Code Description Active Date MDM Diagnosis S61.411D Laceration without foreign body of right hand, subsequent encounter 11/22/2020 No Yes L98.492 Non-pressure chronic ulcer of skin of other sites with fat layer exposed 11/22/2020 No Yes Inactive Problems Resolved Problems Electronic Signature(s) Signed: 11/27/2020 5:02:09 PM By: Worthy Keeler PA-C Previous Signature: 11/27/2020 1:35:25 PM Version By: Worthy Keeler PA-C Entered By: Worthy Keeler on 11/27/2020 16:53:38 Soh, Gardiner Peterson ([id number]) -------------------------------------------------------------------------------- Progress Note Details Patient Name: Heather Peterson. Date of Service: 11/27/2020 8:15 AM Medical Record Number: [id number] Patient Account Number: [id number] Date of Birth/Sex: [birth year] (80 y.o.  F) Treating RN: Dolan Amen Primary Care Provider: Thereasa Distance Other Clinician: Referring Provider: Thereasa Distance Treating Provider/Extender: Skipper Cliche in Treatment: 0 Subjective Chief Complaint Information obtained from Patient Right hand wound History of Present Illness (HPI) Admission 7/13 Ms. Julien Berryman is a 80 year old female with a past medical history of type 2 diabetes on oral agents, COPD and CKD stage III that presents to the clinic for a right hand wound. She reports that 2 weeks ago she cut her hand on her walker. She visited the ED and they placed sutures. She returned to the ED 10 days later to have these removed. Unfortunately the area dehisced and at that time the skin flap had darkness along the edges. She has been using mupirocin cream on it daily. She currently denies signs of infection. 11/27/20 upon evaluation today patient appears for reevaluation here in the clinic due to issues she's been having with the laceration on the palm of her hand immediately. With that being said there is a basically crescent laceration noted here  there is a portion of the flap which is actually to chronic and this is going to need to be cleaned away based on what I see. Fortunately there does not appear to be any evidence of active infection which is great news she does have quite a bit of pain however. Initially we were hoping to be able to debride some this way without having to know her but eventually did have to use lidocaine to numb the area. Objective Constitutional Well-nourished and well-hydrated in no acute distress. Vitals Time Taken: 8:20 AM, Height: 63 in, Weight: 156 lbs, BMI: 27.6, Temperature: 97.6 F, Pulse: 79 bpm, Respiratory Rate: 18 breaths/min, Blood Pressure: 106/70 mmHg. Respiratory normal breathing without difficulty. Psychiatric this patient is able to make decisions and demonstrates good insight into disease process. Alert and Oriented x 3. pleasant and cooperative. General Notes: After using 5 mL of 2% lidocaine to numb the region circumferentially I then proceeded to clean away the necrotic portion of the flap which the patient tolerated today with no pain at that point. I was able to get down to get tissue pretty much on all fronts and in the end the wound did appear to be doing much better. There is no signs of any remaining necrotic tissue at this point. Integumentary (Hair, Skin) Wound #1 status is Open. Original cause of wound was Skin Tear/Laceration. The date acquired was: 11/08/2020. The wound is located on the Right Hand - Palm. The wound measures 2.6cm length x 0.5cm width x 0.3cm depth; 1.021cm^2 area and 0.306cm^3 volume. There is Fat Layer (Subcutaneous Tissue) exposed. There is no tunneling or undermining noted. There is a medium amount of serosanguineous drainage noted. There is medium (34-66%) pink granulation within the wound bed. There is a medium (34-66%) amount of necrotic tissue within the wound bed including Adherent Slough. Assessment Active Problems ICD-10 Laceration without foreign  body of right hand, subsequent encounter Non-pressure chronic ulcer of skin of other sites with fat layer exposed [place], Rogers ([id number]) Procedures Wound #1 Pre-procedure diagnosis of Wound #1 is a Dehisced Wound located on the Right Hand - Palm . There was a Excisional Skin/Subcutaneous Tissue Debridement with a total area of 1.3 sq cm performed by Tommie Sams., PA-C. With the following instrument(s): Curette, Forceps, and Scissors to remove Viable and Non-Viable tissue/material. Material removed includes Subcutaneous Tissue, Skin: Dermis, and Skin: Epidermis after achieving pain control using Lidocaine Injectable: 2%. A time  out was conducted at 08:33, prior to the start of the procedure. A Moderate amount of bleeding was controlled with Pressure. The procedure was tolerated well. Post Debridement Measurements: 2.6cm length x 0.5cm width x 0.4cm depth; 0.408cm^3 volume. Character of Wound/Ulcer Post Debridement is stable. Post procedure Diagnosis Wound #1: Same as Pre-Procedure Plan Follow-up Appointments: Return Appointment in 1 week. Bathing/ Shower/ Hygiene: May shower; gently cleanse wound with antibacterial soap, rinse and pat dry prior to dressing wounds WOUND #1: - Hand - Palm Wound Laterality: Right Cleanser: Normal Saline Every Other Day/30 Days Discharge Instructions: Wash your hands with soap and water. Remove old dressing, discard into plastic bag and place into trash. Cleanse the wound with Normal Saline prior to applying a clean dressing using gauze sponges, not tissues or cotton balls. Do not scrub or use excessive force. Pat dry using gauze sponges, not tissue or cotton balls. Primary Dressing: Prisma 4.34 (in) Every Other Day/30 Days Discharge Instructions: Moisten w/normal saline or sterile water; Cover wound as directed. Do not remove from wound bed. Primary Dressing: Zetuvit Plus Silicone Border Dressing 4x4 (in/in) (DME) (Generic) Every Other Day/30  Days Discharge Instructions: Cover wound 1. I do recommend that we actually initiate treatment with silver collagen. I think that's probably be the best way to go. 2. I'm also gonna recommend covering this with a is a two bit Boarder Foam Dressing which I think will do best to help keep the area clean and moist allow the collagen to do its job. Also catching excessive drainage. 3. I also think will continue to monitor for any signs of infection the right now I do not see any evidence of an infection going on at this point. Please see above for specific wound care orders. We will see patient for re-evaluation in 1 week(s) here in the clinic. If anything worsens or changes patient will contact our office for additional recommendations. Electronic Signature(s) Signed: 11/27/2020 5:02:09 PM By: Worthy Keeler PA-C Entered By: Worthy Keeler on 11/27/2020 16:53:49 [place], Gardiner Peterson ([id number]) -------------------------------------------------------------------------------- SuperBill Details Patient Name: LAIKLYNN, RACZYNSKI. Date of Service: 11/27/2020 Medical Record Number: [id number] Patient Account Number: [id number] Date of Birth/Sex: [birth year] (80 y.o. F) Treating RN: Dolan Amen Primary Care Provider: Thereasa Distance Other Clinician: Referring Provider: Thereasa Distance Treating Provider/Extender: Skipper Cliche in Treatment: 0 Diagnosis Coding ICD-10 Codes Code Description [phone] Laceration without foreign body of right hand, subsequent encounter L98.492 Non-pressure chronic ulcer of skin of other sites with fat layer exposed Facility Procedures CPT4 Code: [id number] Description: [place] TISSUE 20 SQ CM/< Modifier: Quantity: 1 CPT4 Code: Description: ICD-10 Diagnosis Description S61.411D Laceration without foreign body of right hand, subsequent encounter L98.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

## 2020-11-28 ENCOUNTER — Encounter: Payer: Self-pay | Admitting: Oncology

## 2020-12-04 NOTE — Progress Notes (Signed)
[name]  [phone] [phone]  ID: Heather Peterson OB: [birth year]  MR#: [id number]  [facility]#:[id number]  [name], MD as PCP - General (Family Medicine) Lloyd Huger, MD as Consulting Physician (Oncology)  CHIEF COMPLAINT: Multiple myeloma, subdural hematoma.  INTERVAL HISTORY: Patient returns to clinic today for further evaluation and continuation of Velcade.  She has noticed increased weakness and fatigue over the past 1 to 2 weeks, but otherwise feels well.  She has intermittent pain in her right foot that radiates up her leg through her knee. She has no other neurologic complaints.  She has chronic shortness of breath and requires oxygen 24 hours/day.  She denies any fevers.  She has a fair appetite.  She denies any chest pain, cough, or hemoptysis. She denies any nausea, vomiting, constipation, or diarrhea. She has no melena or hematochezia.  She has no urinary complaints.  Patient offers no further specific complaints today.  REVIEW OF SYSTEMS:   Review of Systems  Constitutional:  Positive for malaise/fatigue. Negative for fever.  Respiratory:  Positive for shortness of breath. Negative for cough and hemoptysis.   Cardiovascular: Negative.  Negative for chest pain and leg swelling.  Gastrointestinal: Negative.  Negative for abdominal pain, diarrhea, melena and nausea.  Genitourinary: Negative.  Negative for dysuria and hematuria.  Musculoskeletal:  Positive for back pain. Negative for joint pain.  Skin: Negative.  Negative for rash.  Neurological:  Positive for sensory change and weakness. Negative for dizziness, focal weakness and headaches.  Psychiatric/Behavioral: Negative.  The patient is not nervous/anxious.    As per HPI. Otherwise, a complete review of systems is negative.  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[place])    Prolonged Q-T interval on ECG    Thyroid disease     PAST SURGICAL HISTORY: Past Surgical History:  Procedure Laterality Date   CENTRAL LINE INSERTION  03/11/2017   Procedure: CENTRAL LINE INSERTION;  Surgeon: Leonie Man, MD;  Location: [facility];  Service: Cardiovascular;;   CHOLECYSTECTOMY     COLONOSCOPY WITH PROPOFOL N/A 09/01/2019   Procedure: COLONOSCOPY WITH PROPOFOL;  Surgeon: Toledo, Benay Pike, MD;  Location: [facility];  Service: Gastroenterology;  Laterality: N/A;   CORONARY STENT INTERVENTION W/IMPELLA N/A 03/11/2017   Procedure: Coronary Stent Intervention w/Impella;  Surgeon: Leonie Man, MD;  Location: [facility];  Service: Cardiovascular;  Laterality: N/A;   ESOPHAGOGASTRODUODENOSCOPY (EGD) WITH PROPOFOL N/A 09/01/2019   Procedure: ESOPHAGOGASTRODUODENOSCOPY (EGD) WITH PROPOFOL;  Surgeon: Toledo, Benay Pike, MD;  Location: [facility];  Service: Gastroenterology;  Laterality: N/A;   ESOPHAGOGASTRODUODENOSCOPY (EGD) WITH PROPOFOL N/A 09/08/2019   Procedure: ESOPHAGOGASTRODUODENOSCOPY (EGD) WITH PROPOFOL;  Surgeon: [name], MD;  Location: [facility];  Service: Gastroenterology;  Laterality: N/A;   EYE SURGERY     HIP ARTHROPLASTY Right 03/29/2020   Procedure: ARTHROPLASTY BIPOLAR HIP (HEMIARTHROPLASTY);  Surgeon: Corky Mull, MD;  Location: [facility];  Service: Orthopedics;  Laterality: Right;   INTRAVASCULAR PRESSURE WIRE/FFR STUDY N/A  03/11/2017   Procedure: INTRAVASCULAR PRESSURE WIRE/FFR STUDY;  Surgeon: Leonie Man, MD;  Location: [place] [facility];  Service: Cardiovascular;  Laterality: N/A;   INTRAVASCULAR ULTRASOUND/IVUS N/A  03/11/2017   Procedure: Intravascular Ultrasound/IVUS;  Surgeon: Leonie Man, MD;  Location: [place] [facility];  Service: Cardiovascular;  Laterality: N/A;   LEFT HEART CATH AND CORONARY ANGIOGRAPHY N/A 03/05/2017   Procedure: LEFT HEART CATH AND CORONARY ANGIOGRAPHY;  Surgeon: Isaias Cowman, MD;  Location: [facility];  Service: Cardiovascular;  Laterality: N/A;   PACEMAKER IMPLANT     pacemaker/defibrillator Left     FAMILY HISTORY: Family History  Problem Relation Age of Onset   Hypertension Father    Heart attack Father    Depression Sister    Depression Brother    Depression Brother     ADVANCED DIRECTIVES (Y/N):  N  HEALTH MAINTENANCE: Social History   Tobacco Use   Smoking status: Every Day    Packs/day: 0.25    Types: E-cigarettes, Cigarettes   Smokeless tobacco: Never  Vaping Use   Vaping Use: Former  Substance Use Topics   Alcohol use: Not Currently    Comment: occasionally   Drug use: Yes    Comment: prescribed pain meds     Colonoscopy:  PAP:  Bone density:  Lipid panel:  Allergies  Allergen Reactions   Celebrex [Celecoxib] Anaphylaxis   Glipizide Anaphylaxis   Levaquin [Levofloxacin In D5w] Other (See Comments)    Heart arrhthymias   Levofloxacin Other (See Comments) and Palpitations    ICD fired   Lisinopril Swelling    Lip and facial swelling   Sulfa Antibiotics Other (See Comments) and Anaphylaxis    Reaction: unknown   Metformin Other (See Comments)    Gi tolerance    Penicillins Rash and Other (See Comments)    Has patient had a PCN reaction causing immediate rash, facial/tongue/throat swelling, SOB or lightheadedness with hypotension: Unknown Has patient had a PCN reaction causing severe rash involving mucus membranes or skin necrosis: No Has patient had a PCN reaction that required hospitalization: No Has patient had a PCN reaction occurring within the last 10 years: No If all of the above answers are "NO", then  may proceed with Cephalosporin use.     Current Outpatient Medications  Medication Sig Dispense Refill   allopurinol (ZYLOPRIM) 100 MG tablet Take 50 mg by mouth daily.     aspirin EC 81 MG tablet Take 81 mg by mouth at bedtime.      dexamethasone (DECADRON) 4 MG tablet Take 5 tablets (20 mg total) by mouth daily. Take the day after Velcade on days 2,5,9,12. Take with breakfast 40 tablet 3   docusate sodium (COLACE) 100 MG capsule Take 100 mg by mouth 2 (two) times daily.     Ensure Max Protein (ENSURE MAX PROTEIN) LIQD Take 330 mLs (11 oz total) by mouth 2 (two) times daily between meals.     fexofenadine (ALLEGRA) 180 MG tablet Take 180 mg by mouth daily.     FLUoxetine (PROZAC) 10 MG capsule Take 10 mg by mouth daily.     gabapentin (NEURONTIN) 100 MG capsule Take 100 mg by mouth 2 (two) times daily. Patient takes 286m in the morning and 9065mat night.     gabapentin (NEURONTIN) 300 MG capsule Take 30074mn AM, 300m3m afternoon, 900mg25mnight (Patient taking differently: Patient takes 200mg 15mhe morning and 900mg a35mght.) 150 capsule 3  gabapentin (NEURONTIN) 600 MG tablet Take 600 mg by mouth daily. Patient takes 224m in the morning and 90101mat night.     levothyroxine (SYNTHROID) 50 MCG tablet Take by mouth.     levothyroxine (SYNTHROID) 75 MCG tablet Take 1 tablet (75 mcg total) by mouth daily before breakfast. On Monday, Wednesday and Friday     magnesium oxide (MAG-OX) 400 MG tablet Take 200 mg by mouth daily.     mexiletine (MEXITIL) 200 MG capsule Take 1 capsule (200 mg total) by mouth every 12 (twelve) hours. 60 capsule 1   midodrine (PROAMATINE) 10 MG tablet Take 1 tablet (10 mg total) by mouth 2 (two) times daily with a meal.     OXYGEN Inhale 2 L into the lungs continuous.     pantoprazole (PROTONIX) 40 MG tablet Take 40 mg by mouth daily.      Prenatal Vit-Fe Fumarate-FA (PRENATAL MULTIVITAMIN) TABS tablet Take 1 tablet by mouth daily.     simvastatin (ZOCOR) 40 MG  tablet Take 20 mg by mouth at bedtime.   2   traZODone (DESYREL) 50 MG tablet Take 50 mg by mouth at bedtime.     hydrocortisone (ANUSOL-HC) 2.5 % rectal cream Apply 1 application topically 2 (two) times daily. (Patient not taking: No sig reported)     Lactulose 20 GM/30ML SOLN Take 30 mLs (20 g total) by mouth in the morning and at bedtime. (Patient not taking: No sig reported) 450 mL 1   polyethylene glycol (MIRALAX) 17 g packet Take 17 g by mouth daily. (Patient not taking: Reported on 12/05/2020) 14 each 0   zolpidem (AMBIEN) 5 MG tablet Take 1 tablet (5 mg total) by mouth at bedtime as needed for sleep. (Patient not taking: No sig reported) 30 tablet 0   No current facility-administered medications for this visit.    OBJECTIVE: Vitals:   12/05/20 1048  BP: (!) 94/58  Pulse: 81  Temp: (!) 97 F (36.1 C)  SpO2: 100%     Body mass index is 28.7 kg/m.    ECOG FS:1 - Symptomatic but completely ambulatory  General: Well-developed, well-nourished, no acute distress.  Sitting in a wheelchair. Eyes: Pink conjunctiva, anicteric sclera. HEENT: Normocephalic, moist mucous membranes. Lungs: No audible wheezing or coughing. Heart: Regular rate and rhythm. Abdomen: Soft, nontender, no obvious distention. Musculoskeletal: No edema, cyanosis, or clubbing. Neuro: Alert, answering all questions appropriately. Cranial nerves grossly intact. Skin: No rashes or petechiae noted. Psych: Normal affect.   LAB RESULTS:  Lab Results  Component Value Date   NA 137 12/05/2020   K 3.9 12/05/2020   CL 100 12/05/2020   CO2 26 12/05/2020   GLUCOSE 178 (H) 12/05/2020   BUN 36 (H) 12/05/2020   CREATININE 1.62 (H) 12/05/2020   CALCIUM 8.4 (L) 12/05/2020   PROT 6.4 (L) 12/05/2020   ALBUMIN 3.6 12/05/2020   AST 21 12/05/2020   ALT 22 12/05/2020   ALKPHOS 98 12/05/2020   BILITOT 0.3 12/05/2020   GFRNONAA 32 (L) 12/05/2020   GFRAA 30 (L) 09/09/2019    Lab Results  Component Value Date   WBC 13.2  (H) 12/05/2020   NEUTROABS 10.2 (H) 12/05/2020   HGB 6.5 (L) 12/05/2020   HCT 22.2 (L) 12/05/2020   MCV 93.7 12/05/2020   PLT 298 12/05/2020   Lab Results  Component Value Date   IRON 85 05/23/2020   TIBC 421 05/23/2020   IRONPCTSAT 20 05/23/2020   Lab Results  Component Value Date  FERRITIN 50 05/23/2020     STUDIES: MR LUMBAR SPINE WO CONTRAST  Result Date: 11/11/2020 CLINICAL DATA:  Chronic low back pain with right-sided sciatica EXAM: MRI LUMBAR SPINE WITHOUT CONTRAST TECHNIQUE: Multiplanar, multisequence MR imaging of the lumbar spine was performed. No intravenous contrast was administered. COMPARISON:  CT scan 06/18/2020 FINDINGS: Segmentation: The lowest lumbar type non-rib-bearing vertebra is labeled as L5. The T12 ribs are hypoplastic. Alignment:  3 mm of degenerative grade 1 anterolisthesis at L3-4. Vertebrae: Marrow heterogeneity is present. Although this can be caused by marrow infiltrative processes, the most common causes include anemia, smoking, obesity, or advancing age. Mild type 2 degenerative endplate findings at Q9-4. Disc desiccation at all lumbar levels. Loss of disc height especially at L4-5. Conus medullaris and cauda equina: Conus extends to the T12 level. Conus and cauda equina appear normal. Paraspinal and other soft tissues: Scarring in the right kidney lower pole laterally and also in the left mid kidney. A fluid signal intensity lesion of the left kidney is partially characterized on today's exam. This is statistically likely to be a cyst but not technically specific. Left adrenal adenoma. There is a small but abnormal amount of free pelvic fluid. 0.6 by 0.4 cm nonspecific nodule posteriorly along this free pelvic fluid on image 7 series 3. Sigmoid diverticulosis. Disc levels: L1-2: No impingement.  Mild degenerative right facet arthropathy. L2-3: Borderline bilateral foraminal stenosis due to disc bulge and facet arthropathy. L3-4: No impingement. Disc bulge and  facet arthropathy with small left inferior foraminal disc protrusion. L4-5: Mild right and borderline left subarticular lateral recess stenosis with borderline right foraminal stenosis due to intervertebral and facet spurring along with disc bulge and small central disc protrusion. L5-S1: No impingement. Small central disc protrusion. Intervertebral spurring. IMPRESSION: 1. Lumbar spondylosis and degenerative disc disease, causing mild impingement at L4-5 and borderline impingement at L2-3. 2. Small but abnormal amount of free pelvic fluid with a small nonspecific nodule posteriorly along the margin of the free fluid, significance uncertain. 3. Marrow heterogeneity is present. Although this can be caused by marrow infiltrative processes, the most common causes include anemia, smoking, obesity, or advancing age. 4. Sigmoid diverticulosis. Electronically Signed   By: Van Clines M.D.   On: 11/11/2020 12:08    ASSESSMENT: Multiple myeloma, fractured right hip.  PLAN:    1.  Multiple myeloma: Bone marrow biopsy confirmed diagnosis with plasma cells up to 90% of biopsy.  Cytogenetics are reported as normal.  Metastatic bone survey from March 03, 2020 reviewed independently with multiple skeletal lucencies consistent with myeloma. Her M spike was initially 1.2 and initially trended down to 0.1, but more recently has trended up to 0.7 IgA levels initially were 2773, trended down to normal, but now increased to 1197. Kappa and lambda free light chains continue to be within normal limits. Initial plan was to give Velcade on days 1, 4, 8, and 11 along with 10 mg Revlimid on days 1 through 14. Patient will also receive weekly 20 mg dexamethasone.  Revlimid was discontinued secondary to intolerance.  Patient did not receive treatment between June 20, 2020 and November 14, 2020.  Proceed with cycle 5, day 1 of single agent Velcade today.  Return to clinic for Velcade only and then in 1 week for further evaluation  and consideration of cycle 5, day 8. 2.  Anemia: Hemoglobin significantly worse today at 6.5 and patient is more symptomatic.  Proceed with 1 unit of packed red blood cells and addition  of Velcade today.  Likely multifactorial with chronic renal insufficiency, underlying myeloma, as well as history of recent GI bleed.  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  3.  Chronic renal insufficiency: Creatinine increased, but stable.  Continue treatment as above. 4.  Ascites: Patient does not complain of this today.  Continue follow-up with heart failure clinic as scheduled. 5.  Angiodysplastic lesions: Continue follow-up with GI as scheduled. 6. Constipation: Patient does not complain of this today.  Continue stool softeners and MiraLAX as needed. 7.  Right hip fracture: Continue rehab and follow-up with orthopedics as scheduled. Continue Percocet as needed. 8. Thrombocytopenia: Resolved. 9.  Peripheral neuropathy: Continue 700 mg gabapentin at night in addition to 100 mg in the morning and in the afternoon.   10.  Right foot pain: Does not appear to be a peripheral neuropathy or related to her myeloma.  Continue hydrocodone as needed.  Patient expressed understanding and was in agreement with this plan. She also understands that She can call clinic at any time with any questions, concerns, or complaints.    Lloyd Huger, MD   12/06/2020 8:06 AM

## 2020-12-05 ENCOUNTER — Inpatient Hospital Stay: Payer: Medicare Other

## 2020-12-05 ENCOUNTER — Other Ambulatory Visit: Payer: Self-pay

## 2020-12-05 ENCOUNTER — Inpatient Hospital Stay (HOSPITAL_BASED_OUTPATIENT_CLINIC_OR_DEPARTMENT_OTHER): Payer: Medicare Other | Admitting: Oncology

## 2020-12-05 VITALS — BP 101/66 | HR 83 | Temp 96.3°F | Resp 18

## 2020-12-05 VITALS — BP 94/58 | HR 81 | Temp 97.0°F | Wt 162.0 lb

## 2020-12-05 DIAGNOSIS — C9000 Multiple myeloma not having achieved remission: Secondary | ICD-10-CM | POA: Diagnosis not present

## 2020-12-05 DIAGNOSIS — Z5111 Encounter for antineoplastic chemotherapy: Secondary | ICD-10-CM | POA: Diagnosis not present

## 2020-12-05 LAB — COMPREHENSIVE METABOLIC PANEL
ALT: 22 U/L (ref 0–44)
AST: 21 U/L (ref 15–41)
Albumin: 3.6 g/dL (ref 3.5–5.0)
Alkaline Phosphatase: 98 U/L (ref 38–126)
Anion gap: 11 (ref 5–15)
BUN: 36 mg/dL — ABNORMAL HIGH (ref 8–23)
CO2: 26 mmol/L (ref 22–32)
Calcium: 8.4 mg/dL — ABNORMAL LOW (ref 8.9–10.3)
Chloride: 100 mmol/L (ref 98–111)
Creatinine, Ser: 1.62 mg/dL — ABNORMAL HIGH (ref 0.44–1.00)
GFR, Estimated: 32 mL/min — ABNORMAL LOW (ref >60)
Glucose, Bld: 178 mg/dL — ABNORMAL HIGH (ref 70–99)
Potassium: 3.9 mmol/L (ref 3.5–5.1)
Sodium: 137 mmol/L (ref 135–145)
Total Bilirubin: 0.3 mg/dL (ref 0.3–1.2)
Total Protein: 6.4 g/dL — ABNORMAL LOW (ref 6.5–8.1)

## 2020-12-05 LAB — PREPARE RBC (CROSSMATCH)

## 2020-12-05 LAB — CBC WITH DIFFERENTIAL/PLATELET
Abs Immature Granulocytes: 0.22 10*3/uL — ABNORMAL HIGH (ref 0.00–0.07)
Basophils Absolute: 0 10*3/uL (ref 0.0–0.1)
Basophils Relative: 0 %
Eosinophils Absolute: 0.1 10*3/uL (ref 0.0–0.5)
Eosinophils Relative: 1 %
HCT: 22.2 % — ABNORMAL LOW (ref 36.0–46.0)
Hemoglobin: 6.5 g/dL — ABNORMAL LOW (ref 12.0–15.0)
Immature Granulocytes: 2 %
Lymphocytes Relative: 12 %
Lymphs Abs: 1.6 10*3/uL (ref 0.7–4.0)
MCH: 27.4 pg (ref 26.0–34.0)
MCHC: 29.3 g/dL — ABNORMAL LOW (ref 30.0–36.0)
MCV: 93.7 fL (ref 80.0–100.0)
Monocytes Absolute: 1.1 10*3/uL — ABNORMAL HIGH (ref 0.1–1.0)
Monocytes Relative: 8 %
Neutro Abs: 10.2 10*3/uL — ABNORMAL HIGH (ref 1.7–7.7)
Neutrophils Relative %: 77 %
Platelets: 298 10*3/uL (ref 150–400)
RBC: 2.37 MIL/uL — ABNORMAL LOW (ref 3.87–5.11)
RDW: 18.6 % — ABNORMAL HIGH (ref 11.5–15.5)
WBC: 13.2 10*3/uL — ABNORMAL HIGH (ref 4.0–10.5)
nRBC: 0.2 % (ref 0.0–0.2)

## 2020-12-05 LAB — SAMPLE TO BLOOD BANK

## 2020-12-05 MED ORDER — DEXAMETHASONE 4 MG PO TABS
20.0000 mg | ORAL_TABLET | Freq: Once | ORAL | Status: AC
  Administered 2020-12-05: 20 mg via ORAL

## 2020-12-05 MED ORDER — DIPHENHYDRAMINE HCL 50 MG/ML IJ SOLN
25.0000 mg | Freq: Once | INTRAMUSCULAR | Status: AC
  Administered 2020-12-05: 25 mg via INTRAVENOUS
  Filled 2020-12-05: qty 1

## 2020-12-05 MED ORDER — ACETAMINOPHEN 325 MG PO TABS
650.0000 mg | ORAL_TABLET | Freq: Once | ORAL | Status: AC
  Administered 2020-12-05: 650 mg via ORAL
  Filled 2020-12-05: qty 2

## 2020-12-05 MED ORDER — BORTEZOMIB CHEMO SQ INJECTION 3.5 MG (2.5MG/ML)
1.3000 mg/m2 | Freq: Once | INTRAMUSCULAR | Status: AC
  Administered 2020-12-05: 2.5 mg via SUBCUTANEOUS
  Filled 2020-12-05: qty 1

## 2020-12-05 MED ORDER — SODIUM CHLORIDE 0.9% IV SOLUTION
250.0000 mL | Freq: Once | INTRAVENOUS | Status: AC
  Administered 2020-12-05: 250 mL via INTRAVENOUS
  Filled 2020-12-05: qty 250

## 2020-12-05 NOTE — Progress Notes (Signed)
Hemoglobin 6.5 and Creatinine 1.62. Per Dr. Grayland Ormond okay to proceed with Velcade at this time. Pt to also receive one unit PRBCs.

## 2020-12-05 NOTE — Progress Notes (Signed)
Per MD, ok to treat with Hgb and SCr.

## 2020-12-05 NOTE — Patient Instructions (Signed)
[facility]  Discharge Instructions: Thank you for choosing [facility] to provide your oncology and hematology care.  If you have a lab appointment with the [facility], please go directly to the [facility] and check in at the registration area.  Wear comfortable clothing and clothing appropriate for easy access to any Portacath or PICC line.   We strive to give you quality time with your provider. You may need to reschedule your appointment if you arrive late (15 or more minutes).  Arriving late affects you and other patients whose appointments are after yours.  Also, if you miss three or more appointments without notifying the office, you may be dismissed from the clinic at the provider's discretion.      For prescription refill requests, have your pharmacy contact our office and allow 72 hours for refills to be completed.    Today you received the following chemotherapy and/or immunotherapy agents Velcade       To help prevent nausea and vomiting after your treatment, we encourage you to take your nausea medication as directed.  BELOW ARE SYMPTOMS THAT SHOULD BE REPORTED IMMEDIATELY: *FEVER GREATER THAN 100.4 F (38 C) OR HIGHER *CHILLS OR SWEATING *NAUSEA AND VOMITING THAT IS NOT CONTROLLED WITH YOUR NAUSEA MEDICATION *UNUSUAL SHORTNESS OF BREATH *UNUSUAL BRUISING OR BLEEDING *URINARY PROBLEMS (pain or burning when urinating, or frequent urination) *BOWEL PROBLEMS (unusual diarrhea, constipation, pain near the anus) TENDERNESS IN MOUTH AND THROAT WITH OR WITHOUT PRESENCE OF ULCERS (sore throat, sores in mouth, or a toothache) UNUSUAL RASH, SWELLING OR PAIN  UNUSUAL VAGINAL DISCHARGE OR ITCHING   Items with * indicate a potential emergency and should be followed up as soon as possible or go to the Emergency Department if any problems should occur.  Please show the CHEMOTHERAPY ALERT CARD or IMMUNOTHERAPY ALERT CARD at check-in  to the Emergency Department and triage nurse.  Should you have questions after your visit or need to cancel or reschedule your appointment, please contact [facility]  [phone] and follow the prompts.  Office hours are 8:00 a.m. to 4:30 p.m. Monday - Friday. Please note that voicemails left after 4:00 p.m. may not be returned until the following business day.  We are closed weekends and major holidays. You have access to a nurse at all times for urgent questions. Please call the main number to the clinic [phone] and follow the prompts.  For any non-urgent questions, you may also contact your provider using MyChart. We now offer e-Visits for anyone 64 and older to request care online for non-urgent symptoms. For details visit [email or web address].   Also download the MyChart app! Go to the app store, search "MyChart", open the app, select [facility], and log in with your MyChart username and password.  Due to Covid, a mask is required upon entering the hospital/clinic. If you do not have a mask, one will be given to you upon arrival. For doctor visits, patients may have 1 support person aged 53 or older with them. For treatment visits, patients cannot have anyone with them due to current Covid guidelines and our immunocompromised population.    https://www.redcrossblood.org/donate-blood/blood-donation-process/what-happens-to-donated-blood/blood-transfusions/types-of-blood-transfusions.html"> https://www.hematology.org/education/patients/blood-basics/blood-[email or web address]-matching"> https://www.nhlbi.nih.gov/health-topics/blood-transfusion">  Blood Transfusion, Adult A blood transfusion is a procedure in which you receive blood or a type of blood cell (blood component) through an IV. You may need a blood transfusion when your blood level is low. This may result from a bleeding disorder, illness, injury, or surgery. The  blood may come from a donor. You may also  be able to donate blood for yourself (autologous blood donation) before a planned surgery. The blood given in a transfusion is made up of different blood components. You may receive: Red blood cells. These carry oxygen to the cells in the body. Platelets. These help your blood to clot. Plasma. This is the liquid part of your blood. It carries proteins and other substances throughout the body. White blood cells. These help you fight infections. If you have hemophilia or another clotting disorder, you may also receive othertypes of blood products. Tell a health care provider about: Any blood disorders you have. Any previous reactions you have had during a blood transfusion. Any allergies you have. All medicines you are taking, including vitamins, herbs, eye drops, creams, and over-the-counter medicines. Any surgeries you have had. Any medical conditions you have, including any recent fever or cold symptoms. Whether you are pregnant or may be pregnant. What are the risks? Generally, this is a safe procedure. However, problems may occur. The most common problems include: A mild allergic reaction, such as red, swollen areas of skin (hives) and itching. Fever or chills. This may be the body's response to new blood cells received. This may occur during or up to 4 hours after the transfusion. More serious problems may include: Transfusion-associated circulatory overload (TACO), or too much fluid in the lungs. This may cause breathing problems. A serious allergic reaction, such as difficulty breathing or swelling around the face and lips. Transfusion-related acute lung injury (TRALI), which causes breathing difficulty and low oxygen in the blood. This can occur within hours of the transfusion or several days later. Iron overload. This can happen after receiving many blood transfusions over a period of time. Infection or virus being transmitted. This is rare because donated blood is carefully tested  before it is given. Hemolytic transfusion reaction. This is rare. It happens when your body's defense system (immune system)tries to attack the new blood cells. Symptoms may include fever, chills, nausea, low blood pressure, and low back or chest pain. Transfusion-associated graft-versus-host disease (TAGVHD). This is rare. It happens when donated cells attack your body's healthy tissues. What happens before the procedure? Medicines Ask your health care provider about: Changing or stopping your regular medicines. This is especially important if you are taking diabetes medicines or blood thinners. Taking medicines such as aspirin and ibuprofen. These medicines can thin your blood. Do not take these medicines unless your health care provider tells you to take them. Taking over-the-counter medicines, vitamins, herbs, and supplements. General instructions Follow instructions from your health care provider about eating and drinking restrictions. You will have a blood test to determine your blood type. This is necessary to know what kind of blood your body will accept and to match it to the donor blood. If you are going to have a planned surgery, you may be able to do an autologous blood donation. This may be done in case you need to have a transfusion. You will have your temperature, blood pressure, and pulse monitored before the transfusion. If you have had an allergic reaction to a transfusion in the past, you may be given medicine to help prevent a reaction. This medicine may be given to you by mouth (orally) or through an IV. Set aside time for the blood transfusion. This procedure generally takes 1-4 hours to complete. What happens during the procedure?  An IV will be inserted into one of your veins. The bag  of donated blood will be attached to your IV. The blood will then enter through your vein. Your temperature, blood pressure, and pulse will be monitored regularly during the transfusion. This  monitoring is done to detect early signs of a transfusion reaction. Tell your nurse right away if you have any of these symptoms during the transfusion: Shortness of breath or trouble breathing. Chest or back pain. Fever or chills. Hives or itching. If you have any signs or symptoms of a reaction, your transfusion will be stopped and you may be given medicine. When the transfusion is complete, your IV will be removed. Pressure may be applied to the IV site for a few minutes. A bandage (dressing)will be applied. The procedure may vary among health care providers and hospitals. What happens after the procedure? Your temperature, blood pressure, pulse, breathing rate, and blood oxygen level will be monitored until you leave the hospital or clinic. Your blood may be tested to see how you are responding to the transfusion. You may be warmed with fluids or blankets to maintain a normal body temperature. If you receive your blood transfusion in an outpatient setting, you will be told whom to contact to report any reactions. Where to find more information For more information on blood transfusions, visit the American Red Cross: redcross.org Summary A blood transfusion is a procedure in which you receive blood or a type of blood cell (blood component) through an IV. The blood you receive may come from a donor or be donated by yourself (autologous blood donation) before a planned surgery. The blood given in a transfusion is made up of different blood components. You may receive red blood cells, platelets, plasma, or white blood cells depending on the condition treated. Your temperature, blood pressure, and pulse will be monitored before, during, and after the transfusion. After the transfusion, your blood may be tested to see how your body has responded. This information is not intended to replace advice given to you by your health care provider. Make sure you discuss any questions you have with your  healthcare provider. Document Revised: 03/04/2019 Document Reviewed: 10/22/2018 Elsevier Patient Education  [place].

## 2020-12-05 NOTE — Progress Notes (Signed)
Patient reports being "in a fog" for the last two weeks.

## 2020-12-06 ENCOUNTER — Encounter: Payer: Self-pay | Admitting: Oncology

## 2020-12-06 ENCOUNTER — Encounter (HOSPITAL_BASED_OUTPATIENT_CLINIC_OR_DEPARTMENT_OTHER): Payer: Medicare Other | Admitting: Internal Medicine

## 2020-12-06 DIAGNOSIS — L98492 Non-pressure chronic ulcer of skin of other sites with fat layer exposed: Secondary | ICD-10-CM | POA: Diagnosis not present

## 2020-12-06 DIAGNOSIS — T8133XA Disruption of traumatic injury wound repair, initial encounter: Secondary | ICD-10-CM | POA: Diagnosis not present

## 2020-12-06 LAB — TYPE AND SCREEN
ABO/RH(D): B POS
Antibody Screen: POSITIVE
Unit division: 0

## 2020-12-06 LAB — BPAM RBC
Blood Product Expiration Date: 202208192359
ISSUE DATE / TIME: 202207261423
Unit Type and Rh: 7300

## 2020-12-06 NOTE — Progress Notes (Signed)
PERSIA, LINTNER ([id number]) Visit Report for 12/06/2020 Arrival Information Details Patient Name: Heather Peterson, Heather Peterson. Date of Service: 12/06/2020 11:30 AM Medical Record Number: [id number] Patient Account Number: [id number] Date of Birth/Sex: 1941-01-09 (80 y.o. F) Treating RN: Dolan Amen Primary Care Daphne Karrer: Thereasa Distance Other Clinician: Referring Trayton Szabo: Thereasa Distance Treating Faten Frieson/Extender: Yaakov Guthrie in Treatment: 2 Visit Information History Since Last Visit Pain Present Now: No Patient Arrived: Walker Arrival Time: 11:49 Accompanied By: husband Transfer Assistance: None Patient Identification Verified: Yes Secondary Verification Process Completed: Yes Patient Has Alerts: Yes Patient Alerts: Type II Diabetic PACEMAKER Electronic Signature(s) Signed: 12/06/2020 4:59:39 PM By: Dolan Amen RN Entered By: Dolan Amen on 12/06/2020 11:50:21 Heather Peterson ([id number]) -------------------------------------------------------------------------------- Clinic Level of Care Assessment Details Patient Name: Heather Peterson. Date of Service: 12/06/2020 11:30 AM Medical Record Number: [id number] Patient Account Number: [id number] Date of Birth/Sex: 08/03/1940 (80 y.o. F) Treating RN: Dolan Amen Primary Care Heydi Swango: Thereasa Distance Other Clinician: Referring Verle Wheeling: Thereasa Distance Treating Chaquetta Schlottman/Extender: Yaakov Guthrie in Treatment: 2 Clinic Level of Care Assessment Items TOOL 1 Quantity Score []  - Use when EandM and Procedure is performed on INITIAL visit 0 ASSESSMENTS - Nursing Assessment / Reassessment []  - General Physical Exam (combine w/ comprehensive assessment (listed just below) when performed on new 0 pt. evals) []  - 0 Comprehensive Assessment (HX, ROS, Risk Assessments, Wounds Hx, etc.) ASSESSMENTS - Wound and Skin Assessment / Reassessment []  - Dermatologic / Skin Assessment (not related to wound  area) 0 ASSESSMENTS - Ostomy and/or Continence Assessment and Care []  - Incontinence Assessment and Management 0 []  - 0 Ostomy Care Assessment and Management (repouching, etc.) PROCESS - Coordination of Care []  - Simple Patient / Family Education for ongoing care 0 []  - 0 Complex (extensive) Patient / Family Education for ongoing care []  - 0 Staff obtains Programmer, systems, Records, Test Results / Process Orders []  - 0 Staff telephones HHA, Nursing Homes / Clarify orders / etc []  - 0 Routine Transfer to another Facility (non-emergent condition) []  - 0 Routine Hospital Admission (non-emergent condition) []  - 0 New Admissions / Biomedical engineer / Ordering NPWT, Apligraf, etc. []  - 0 Emergency Hospital Admission (emergent condition) PROCESS - Special Needs []  - Pediatric / Minor Patient Management 0 []  - 0 Isolation Patient Management []  - 0 Hearing / Language / Visual special needs []  - 0 Assessment of Community assistance (transportation, D/C planning, etc.) []  - 0 Additional assistance / Altered mentation []  - 0 Support Surface(s) Assessment (bed, cushion, seat, etc.) INTERVENTIONS - Miscellaneous []  - External ear exam 0 []  - 0 Patient Transfer (multiple staff / Civil Service fast streamer / Similar devices) []  - 0 Simple Staple / Suture removal (25 or less) []  - 0 Complex Staple / Suture removal (26 or more) []  - 0 Hypo/Hyperglycemic Management (do not check if billed separately) []  - 0 Ankle / Brachial Index (ABI) - do not check if billed separately Has the patient been seen at the hospital within the last three years: Yes Total Score: 0 Level Of Care: ____ Heather Peterson ([id number]) Electronic Signature(s) Signed: 12/06/2020 4:59:39 PM By: Dolan Amen RN Entered By: Dolan Amen on 12/06/2020 12:07:44 Heather Peterson ([id number]) -------------------------------------------------------------------------------- Encounter Discharge Information Details Patient  Name: Heather Peterson, Heather Peterson. Date of Service: 12/06/2020 11:30 AM Medical Record Number: [id number] Patient Account Number: [id number] Date of Birth/Sex: [birth year] (80 y.o. F) Treating RN: Dolan Amen Primary Care Okechukwu Regnier: Thereasa Distance Other Clinician: Referring Shawnise Peterkin: Thereasa Distance  Treating Darely Becknell/Extender: Yaakov Guthrie in Treatment: 2 Encounter Discharge Information Items Post Procedure Vitals Discharge Condition: Stable Temperature (F): 98.3 Ambulatory Status: Walker Pulse (bpm): 92 Discharge Destination: Home Respiratory Rate (breaths/min): 18 Transportation: Private Auto Blood Pressure (mmHg): 109/68 Accompanied By: husband Schedule Follow-up Appointment: Yes Clinical Summary of Care: Electronic Signature(s) Signed: 12/06/2020 4:59:39 PM By: Dolan Amen RN Entered By: Dolan Amen on 12/06/2020 13:31:46 Heather Peterson ([id number]) -------------------------------------------------------------------------------- Lower Extremity Assessment Details Patient Name: Heather Peterson. Date of Service: 12/06/2020 11:30 AM Medical Record Number: [id number] Patient Account Number: [id number] Date of Birth/Sex: [birth year] (80 y.o. F) Treating RN: Dolan Amen Primary Care Teara Duerksen: Thereasa Distance Other Clinician: Referring Zackaria Burkey: Thereasa Distance Treating Vikki Gains/Extender: Yaakov Guthrie in Treatment: 2 Electronic Signature(s) Signed: 12/06/2020 4:59:39 PM By: Dolan Amen RN Entered By: Dolan Amen on 12/06/2020 11:58:49 Heather Peterson, Heather Peterson ([id number]) -------------------------------------------------------------------------------- Multi Wound Chart Details Patient Name: Heather Peterson, Heather Peterson. Date of Service: 12/06/2020 11:30 AM Medical Record Number: [id number] Patient Account Number: [id number] Date of Birth/Sex: 1941-01-16 (80 y.o. F) Treating RN: Dolan Amen Primary Care Fynn Adel: Thereasa Distance Other  Clinician: Referring Raelin Pixler: Thereasa Distance Treating Babbette Dalesandro/Extender: Yaakov Guthrie in Treatment: 2 Vital Signs Height(in): 63 Pulse(bpm): 92 Weight(lbs): 156 Blood Pressure(mmHg): 109/68 Body Mass Index(BMI): 28 Temperature(F): 98.3 Respiratory Rate(breaths/min): 18 Photos: [N/A:N/A] Wound Location: Right Hand - Palm N/A N/A Wounding Event: Skin Tear/Laceration N/A N/A Primary Etiology: Dehisced Wound N/A N/A Comorbid History: Cataracts, Anemia, Chronic N/A N/A Obstructive Pulmonary Disease (COPD), Congestive Heart Failure, Hypertension, Myocardial Infarction, Type II Diabetes, End Stage Renal Disease, Gout, Osteoarthritis, Neuropathy Date Acquired: 11/08/2020 N/A N/A Weeks of Treatment: 2 N/A N/A Wound Status: Open N/A N/A Measurements L x W x D (cm) 1.6x0.6x0.2 N/A N/A Area (cm) : 0.754 N/A N/A Volume (cm) : 0.151 N/A N/A % Reduction in Area: 20.00% N/A N/A % Reduction in Volume: 46.60% N/A N/A Classification: Full Thickness Without Exposed N/A N/A Support Structures Exudate Amount: Medium N/A N/A Exudate Type: Serosanguineous N/A N/A Exudate Color: red, brown N/A N/A Granulation Amount: Medium (34-66%) N/A N/A Granulation Quality: Pink N/A N/A Necrotic Amount: Medium (34-66%) N/A N/A Exposed Structures: Fat Layer (Subcutaneous Tissue): N/A N/A Yes Fascia: No Tendon: No Muscle: No Joint: No Bone: No Epithelialization: None N/A N/A Debridement: Debridement - Selective/Open N/A N/A Wound Pre-procedure Verification/Time 12:05 N/A N/A Out Taken: Tissue Debrided: Slough N/A N/A Level: Non-Viable Tissue N/A N/A Debridement Area (sq cm): 0.96 N/A N/A Instrument: Curette N/A N/A Bleeding: Minimum N/A N/A PAULETTE, ROCKFORD ([id number]) Hemostasis Achieved: Pressure N/A N/A Debridement Treatment Procedure was tolerated well N/A N/A Response: Post Debridement 1.6x0.6x0.3 N/A N/A Measurements L x W x D (cm) Post Debridement Volume: 0.226 N/A  N/A (cm) Assessment Notes: maceration noted periwound N/A N/A Procedures Performed: Debridement N/A N/A Treatment Notes Electronic Signature(s) Signed: 12/06/2020 12:24:13 PM By: Kalman Shan DO Entered By: Kalman Shan on 12/06/2020 12:20:58 Heather Peterson ([id number]) -------------------------------------------------------------------------------- [place] Details Patient Name: Heather Peterson, Heather Peterson. Date of Service: 12/06/2020 11:30 AM Medical Record Number: [id number] Patient Account Number: [id number] Date of Birth/Sex: [birth year] (80 y.o. F) Treating RN: Dolan Amen Primary Care Delphine Sizemore: Thereasa Distance Other Clinician: Referring Daniel Johndrow: Thereasa Distance Treating Demitria Hay/Extender: Yaakov Guthrie in Treatment: 2 Active Inactive Necrotic Tissue Nursing Diagnoses: Impaired tissue integrity related to necrotic/devitalized tissue Goals: Necrotic/devitalized tissue will be minimized in the wound bed Date Initiated: 11/22/2020 Target Resolution Date: 11/22/2020 Goal Status: Active Patient/caregiver will verbalize understanding of reason and process  for debridement of necrotic tissue Date Initiated: 11/22/2020 Target Resolution Date: 11/22/2020 Goal Status: Active Interventions: Assess patient pain level pre-, during and post procedure and prior to discharge Provide education on necrotic tissue and debridement process Treatment Activities: Apply topical anesthetic as ordered : 11/22/2020 Biologic debridement : 11/22/2020 Enzymatic debridement : 11/22/2020 Excisional debridement : 11/22/2020 Notes: Wound/Skin Impairment Nursing Diagnoses: Impaired tissue integrity Goals: Patient/caregiver will verbalize understanding of skin care regimen Date Initiated: 11/22/2020 Target Resolution Date: 11/22/2020 Goal Status: Active Ulcer/skin breakdown will have a volume reduction of 30% by week 4 Date Initiated: 11/22/2020 Target Resolution Date:  12/23/2020 Goal Status: Active Ulcer/skin breakdown will have a volume reduction of 50% by week 8 Date Initiated: 11/22/2020 Target Resolution Date: 01/23/2021 Goal Status: Active Ulcer/skin breakdown will have a volume reduction of 80% by week 12 Date Initiated: 11/22/2020 Target Resolution Date: 02/22/2021 Goal Status: Active Ulcer/skin breakdown will heal within 14 weeks Date Initiated: 11/22/2020 Target Resolution Date: 03/25/2021 Goal Status: Active Interventions: Assess patient/caregiver ability to obtain necessary supplies Assess patient/caregiver ability to perform ulcer/skin care regimen upon admission and as needed Assess ulceration(s) every visit Provide education on ulcer and skin care Heather Peterson, Heather Peterson ([id number]) Treatment Activities: Referred to DME Betti Goodenow for dressing supplies : 11/22/2020 Skin care regimen initiated : 11/22/2020 Notes: Electronic Signature(s) Signed: 12/06/2020 4:59:39 PM By: Dolan Amen RN Entered By: Dolan Amen on 12/06/2020 12:04:04 Heather Peterson ([id number]) -------------------------------------------------------------------------------- Pain Assessment Details Patient Name: Heather Peterson. Date of Service: 12/06/2020 11:30 AM Medical Record Number: [id number] Patient Account Number: [id number] Date of Birth/Sex: [birth year] (80 y.o. F) Treating RN: Dolan Amen Primary Care Talullah Abate: Thereasa Distance Other Clinician: Referring Lania Zawistowski: Thereasa Distance Treating Jadarious Dobbins/Extender: Yaakov Guthrie in Treatment: 2 Active Problems Location of Pain Severity and Description of Pain Patient Has Paino No Site Locations Rate the pain. Current Pain Level: 0 Pain Management and Medication Current Pain Management: Electronic Signature(s) Signed: 12/06/2020 4:59:39 PM By: Dolan Amen RN Entered By: Dolan Amen on 12/06/2020 11:52:07 Cavan, Pari M.  ([id number]) -------------------------------------------------------------------------------- Wound Assessment Details Patient Name: Heather Peterson, Heather Peterson. Date of Service: 12/06/2020 11:30 AM Medical Record Number: [id number] Patient Account Number: [id number] Date of Birth/Sex: May 22, 1940 (80 y.o. F) Treating RN: Dolan Amen Primary Care Issacc Merlo: Thereasa Distance Other Clinician: Referring Donnis Phaneuf: Thereasa Distance Treating Monicka Cyran/Extender: Yaakov Guthrie in Treatment: 2 Wound Status Wound Number: 1 Primary Dehisced Wound Etiology: Wound Location: Right Hand - Palm Wound Open Wounding Event: Skin Tear/Laceration Status: Date Acquired: 11/08/2020 Comorbid Cataracts, Anemia, Chronic Obstructive Pulmonary Disease Weeks Of Treatment: 2 History: (COPD), Congestive Heart Failure, Hypertension, Clustered Wound: No Myocardial Infarction, Type II Diabetes, End Stage Renal Disease, Gout, Osteoarthritis, Neuropathy Photos Wound Measurements Length: (cm) 1.6 Width: (cm) 0.6 Depth: (cm) 0.2 Area: (cm) 0.754 Volume: (cm) 0.151 % Reduction in Area: 20% % Reduction in Volume: 46.6% Epithelialization: None Tunneling: No Undermining: No Wound Description Classification: Full Thickness Without Exposed Support Structu Exudate Amount: Medium Exudate Type: Serosanguineous Exudate Color: red, brown res Foul Odor After Cleansing: No Slough/Fibrino Yes Wound Bed Granulation Amount: Medium (34-66%) Exposed Structure Granulation Quality: Pink Fascia Exposed: No Necrotic Amount: Medium (34-66%) Fat Layer (Subcutaneous Tissue) Exposed: Yes Necrotic Quality: Adherent Slough Tendon Exposed: No Muscle Exposed: No Joint Exposed: No Bone Exposed: No Assessment Notes maceration noted periwound Treatment Notes Wound #1 (Hand - Palm) Wound Laterality: Right Cleanser Normal [place], [place] ([id number]) Discharge Instruction: Wash your hands with soap and water. Remove  old dressing, discard  into plastic bag and place into trash. Cleanse the wound with Normal Saline prior to applying a clean dressing using gauze sponges, not tissues or cotton balls. Do not scrub or use excessive force. Pat dry using gauze sponges, not tissue or cotton balls. Peri-Wound Care Topical Santyl Collagenase Ointment, 30 (gm), tube Discharge Instruction: Apply nickel thick to wound bed only Primary Dressing Zetuvit Plus Silicone Border Dressing 4x4 (in/in) Discharge Instruction: Cover wound Secondary Dressing Secured With Compression Wrap Compression Stockings Add-Ons Electronic Signature(s) Signed: 12/06/2020 4:59:39 PM By: Dolan Amen RN Entered By: Dolan Amen on 12/06/2020 11:57:05 Heather Peterson ([id number]) -------------------------------------------------------------------------------- [place] Details Patient Name: Heather Peterson. Date of Service: 12/06/2020 11:30 AM Medical Record Number: [id number] Patient Account Number: [id number] Date of Birth/Sex: February 15, 1941 (80 y.o. F) Treating RN: Dolan Amen Primary Care Naw Lasala: Thereasa Distance Other Clinician: Referring Khyler Urda: Thereasa Distance Treating Juaquina Machnik/Extender: Yaakov Guthrie in Treatment: 2 Vital Signs Time Taken: 11:50 Temperature (F): 98.3 Height (in): 63 Pulse (bpm): 92 Weight (lbs): 156 Respiratory Rate (breaths/min): 18 Body Mass Index (BMI): 27.6 Blood Pressure (mmHg): 109/68 Reference Range: 80 - 120 mg / dl Electronic Signature(s) Signed: 12/06/2020 4:59:39 PM By: Dolan Amen RN Entered By: Dolan Amen on 12/06/2020 11:52:02

## 2020-12-06 NOTE — Progress Notes (Signed)
CONITA, AMENTA ([id number]) Visit Report for 12/06/2020 Chief Complaint Document Details Patient Name: Heather Peterson, Heather Peterson. Date of Service: 12/06/2020 11:30 AM Medical Record Number: [id number] Patient Account Number: [id number] Date of Birth/Sex: 1940-06-14 (80 y.o. F) Treating RN: Dolan Amen Primary Care Provider: Thereasa Distance Other Clinician: Referring Provider: Thereasa Distance Treating Provider/Extender: Yaakov Guthrie in Treatment: 2 Information Obtained from: Patient Chief Complaint Right hand wound Electronic Signature(s) Signed: 12/06/2020 12:24:13 PM By: Kalman Shan DO Entered By: Kalman Shan on 12/06/2020 12:21:06 Heather Peterson ([id number]) -------------------------------------------------------------------------------- Debridement Details Patient Name: Heather Peterson. Date of Service: 12/06/2020 11:30 AM Medical Record Number: [id number] Patient Account Number: [id number] Date of Birth/Sex: November 07, 1940 (80 y.o. F) Treating RN: Dolan Amen Primary Care Provider: Thereasa Distance Other Clinician: Referring Provider: Thereasa Distance Treating Provider/Extender: Yaakov Guthrie in Treatment: 2 Debridement Performed for Wound #1 Right Hand - Palm Assessment: Performed By: Physician Kalman Shan, MD Debridement Type: Debridement Level of Consciousness (Pre- Awake and Alert procedure): Pre-procedure Verification/Time Out Yes - 12:05 Taken: Start Time: 12:05 Total Area Debrided (L x W): 1.6 (cm) x 0.6 (cm) = 0.96 (cm) Tissue and other material Non-Viable, [place] debrided: Level: Non-Viable Tissue Debridement Description: Selective/Open Wound Instrument: Curette Bleeding: Minimum Hemostasis Achieved: Pressure Response to Treatment: Procedure was tolerated well Level of Consciousness (Post- Awake and Alert procedure): Post Debridement Measurements of Total Wound Length: (cm) 1.6 Width: (cm) 0.6 Depth:  (cm) 0.3 Volume: (cm) 0.226 Character of Wound/Ulcer Post Debridement: Stable Post Procedure Diagnosis Same as Pre-procedure Electronic Signature(s) Signed: 12/06/2020 12:24:13 PM By: Kalman Shan DO Signed: 12/06/2020 4:59:39 PM By: Dolan Amen RN Entered By: Dolan Amen on 12/06/2020 12:06:17 Heather Peterson ([id number]) -------------------------------------------------------------------------------- HPI Details Patient Name: Heather Peterson. Date of Service: 12/06/2020 11:30 AM Medical Record Number: [id number] Patient Account Number: [id number] Date of Birth/Sex: 14-Mar-1941 (80 y.o. F) Treating RN: Dolan Amen Primary Care Provider: Thereasa Distance Other Clinician: Referring Provider: Thereasa Distance Treating Provider/Extender: Yaakov Guthrie in Treatment: 2 History of Present Illness HPI Description: Admission 7/13 Ms. Lesia Monica is a 80 year old female with a past medical history of type 2 diabetes on oral agents, COPD and CKD stage III that presents to the clinic for a right hand wound. She reports that 2 weeks ago she cut her hand on her walker. She visited the ED and they placed sutures. She returned to the ED 10 days later to have these removed. Unfortunately the area dehisced and at that time the skin flap had darkness along the edges. She has been using mupirocin cream on it daily. She currently denies signs of infection. 11/27/20 upon evaluation today patient appears for reevaluation here in the clinic due to issues she's been having with the laceration on the palm of her hand immediately. With that being said there is a basically crescent laceration noted here there is a portion of the flap which is actually to chronic and this is going to need to be cleaned away based on what I see. Fortunately there does not appear to be any evidence of active infection which is great news she does have quite a bit of pain however. Initially we were  hoping to be able to debride some this way without having to know her but eventually did have to use lidocaine to numb the area. 7/27; patient presents for 1 week follow-up. She has been using collagen to the wound bed daily. She has no issues or complaints today. She denies signs of  infection. Electronic Signature(s) Signed: 12/06/2020 12:24:13 PM By: Kalman Shan DO Entered By: Kalman Shan on 12/06/2020 12:21:57 Heather Peterson ([id number]) -------------------------------------------------------------------------------- Physical Exam Details Patient Name: Heather Peterson, Heather Peterson. Date of Service: 12/06/2020 11:30 AM Medical Record Number: [id number] Patient Account Number: [id number] Date of Birth/Sex: [birth year] (80 y.o. F) Treating RN: Dolan Amen Primary Care Provider: Thereasa Distance Other Clinician: Referring Provider: Thereasa Distance Treating Provider/Extender: Yaakov Guthrie in Treatment: 2 Constitutional . Psychiatric . Notes Right hand: Palmar aspect with crescent like wound with slough and granulation tissue present. No signs of infection. Electronic Signature(s) Signed: 12/06/2020 12:24:13 PM By: Kalman Shan DO Entered By: Kalman Shan on 12/06/2020 12:22:36 Heather Peterson ([id number]) -------------------------------------------------------------------------------- Physician Orders Details Patient Name: Heather Peterson, Heather Peterson. Date of Service: 12/06/2020 11:30 AM Medical Record Number: [id number] Patient Account Number: [id number] Date of Birth/Sex: Aug 15, 1940 (80 y.o. F) Treating RN: Dolan Amen Primary Care Provider: Thereasa Distance Other Clinician: Referring Provider: Thereasa Distance Treating Provider/Extender: Yaakov Guthrie in Treatment: 2 Verbal / Phone Orders: No Diagnosis Coding Follow-up Appointments o Return Appointment in 1 week. Bathing/ Shower/ Hygiene o May shower; gently cleanse wound with antibacterial  soap, rinse and pat dry prior to dressing wounds Wound Treatment Wound #1 - Hand - Palm Wound Laterality: Right Cleanser: Normal Saline Every Other Day/30 Days Discharge Instructions: Wash your hands with soap and water. Remove old dressing, discard into plastic bag and place into trash. Cleanse the wound with Normal Saline prior to applying a clean dressing using gauze sponges, not tissues or cotton balls. Do not scrub or use excessive force. Pat dry using gauze sponges, not tissue or cotton balls. Topical: Santyl Collagenase Ointment, 30 (gm), tube Every Other Day/30 Days Discharge Instructions: Apply nickel thick to wound bed only Primary Dressing: Zetuvit Plus Silicone Border Dressing 4x4 (in/in) (Generic) Every Other Day/30 Days Discharge Instructions: Cover wound Electronic Signature(s) Signed: 12/06/2020 12:24:13 PM By: Kalman Shan DO Signed: 12/06/2020 4:59:39 PM By: Dolan Amen RN Entered By: Dolan Amen on 12/06/2020 12:07:34 Heather Peterson ([id number]) -------------------------------------------------------------------------------- Problem List Details Patient Name: Heather Peterson, Heather Peterson. Date of Service: 12/06/2020 11:30 AM Medical Record Number: [id number] Patient Account Number: [id number] Date of Birth/Sex: 09/28/1940 (80 y.o. F) Treating RN: Dolan Amen Primary Care Provider: Thereasa Distance Other Clinician: Referring Provider: Thereasa Distance Treating Provider/Extender: Yaakov Guthrie in Treatment: 2 Active Problems ICD-10 Encounter Code Description Active Date MDM Diagnosis [phone] Laceration without foreign body of right hand, subsequent encounter 11/22/2020 No Yes L98.492 Non-pressure chronic ulcer of skin of other sites with fat layer exposed 11/22/2020 No Yes Inactive Problems Resolved Problems Electronic Signature(s) Signed: 12/06/2020 12:24:13 PM By: Kalman Shan DO Entered By: Kalman Shan on 12/06/2020 12:20:51 Heather Peterson,  Heather M. ([id number]) -------------------------------------------------------------------------------- Progress Note Details Patient Name: Heather Peterson. Date of Service: 12/06/2020 11:30 AM Medical Record Number: [id number] Patient Account Number: [id number] Date of Birth/Sex: [birth year] (80 y.o. F) Treating RN: Dolan Amen Primary Care Provider: Thereasa Distance Other Clinician: Referring Provider: Thereasa Distance Treating Provider/Extender: Yaakov Guthrie in Treatment: 2 Subjective Chief Complaint Information obtained from Patient Right hand wound History of Present Illness (HPI) Admission 7/13 Ms. Breanda Greenlaw is a 80 year old female with a past medical history of type 2 diabetes on oral agents, COPD and CKD stage III that presents to the clinic for a right hand wound. She reports that 2 weeks ago she cut her hand on her walker. She visited the ED and they placed sutures. She returned to the ED  10 days later to have these removed. Unfortunately the area dehisced and at that time the skin flap had darkness along the edges. She has been using mupirocin cream on it daily. She currently denies signs of infection. 11/27/20 upon evaluation today patient appears for reevaluation here in the clinic due to issues she's been having with the laceration on the palm of her hand immediately. With that being said there is a basically crescent laceration noted here there is a portion of the flap which is actually to chronic and this is going to need to be cleaned away based on what I see. Fortunately there does not appear to be any evidence of active infection which is great news she does have quite a bit of pain however. Initially we were hoping to be able to debride some this way without having to know her but eventually did have to use lidocaine to numb the area. 7/27; patient presents for 1 week follow-up. She has been using collagen to the wound bed daily. She has no issues or  complaints today. She denies signs of infection. Patient History Information obtained from Patient. Social History Current every day smoker, Marital Status - Married, Alcohol Use - Never, Drug Use - No History, Caffeine Use - Daily. Medical History Eyes Patient has history of Cataracts Denies history of Glaucoma, Optic Neuritis Ear/Nose/Mouth/Throat Denies history of Chronic sinus problems/congestion, Middle ear problems Hematologic/Lymphatic Patient has history of Anemia Denies history of Hemophilia, Human Immunodeficiency Virus, Lymphedema, Sickle Cell Disease Respiratory Patient has history of Chronic Obstructive Pulmonary Disease (COPD) Denies history of Aspiration, Asthma, Pneumothorax, Sleep Apnea, Tuberculosis Cardiovascular Patient has history of Congestive Heart Failure, Hypertension, Myocardial Infarction Gastrointestinal Denies history of Cirrhosis , Colitis, Crohn s, Hepatitis A, Hepatitis B, Hepatitis C Endocrine Patient has history of Type II Diabetes Denies history of Type I Diabetes Genitourinary Patient has history of End Stage Renal Disease - CKD stage 3 Immunological Denies history of Lupus Erythematosus, Raynaud s, Scleroderma Integumentary (Skin) Denies history of History of Burn, History of pressure wounds Musculoskeletal Patient has history of Gout, Osteoarthritis Neurologic Patient has history of Neuropathy Denies history of Dementia, Quadriplegia, Paraplegia, Seizure Disorder Oncologic Denies history of Received Chemotherapy, Received Radiation Psychiatric Denies history of Anorexia/bulimia, Confinement Anxiety Medical And Surgical History Notes Oncologic Multiple myeloma Heather Peterson, Heather Peterson. ([id number]) Objective Constitutional Vitals Time Taken: 11:50 AM, Height: 63 in, Weight: 156 lbs, BMI: 27.6, Temperature: 98.3 F, Pulse: 92 bpm, Respiratory Rate: 18 breaths/min, Blood Pressure: 109/68 mmHg. General Notes: Right hand: Palmar aspect with  crescent like wound with slough and granulation tissue present. No signs of infection. Integumentary (Hair, Skin) Wound #1 status is Open. Original cause of wound was Skin Tear/Laceration. The date acquired was: 11/08/2020. The wound has been in treatment 2 weeks. The wound is located on the Right Hand - Palm. The wound measures 1.6cm length x 0.6cm width x 0.2cm depth; 0.754cm^2 area and 0.151cm^3 volume. There is Fat Layer (Subcutaneous Tissue) exposed. There is no tunneling or undermining noted. There is a medium amount of serosanguineous drainage noted. There is medium (34-66%) pink granulation within the wound bed. There is a medium (34-66%) amount of necrotic tissue within the wound bed including Adherent Slough. General Notes: maceration noted periwound Assessment Active Problems ICD-10 Laceration without foreign body of right hand, subsequent encounter Non-pressure chronic ulcer of skin of other sites with fat layer exposed Patient's wound looks improved from last clinic visit. I debrided nonviable tissue however some of this is still  present. I recommended using Santyl again daily. Once this is cleaned up we will proceed with collagen. No signs of infection. Follow-up in 1 week Procedures Wound #1 Pre-procedure diagnosis of Wound #1 is a Dehisced Wound located on the Right Hand - Palm . There was a Selective/Open Wound Non-Viable Tissue Debridement with a total area of 0.96 sq cm performed by Kalman Shan, MD. With the following instrument(s): Curette to remove Non- Viable tissue/material. Material removed includes [place]. A time out was conducted at 12:05, prior to the start of the procedure. A Minimum amount of bleeding was controlled with Pressure. The procedure was tolerated well. Post Debridement Measurements: 1.6cm length x 0.6cm width x 0.3cm depth; 0.226cm^3 volume. Character of Wound/Ulcer Post Debridement is stable. Post procedure Diagnosis Wound #1: Same as  Pre-Procedure Plan Follow-up Appointments: Return Appointment in 1 week. Bathing/ Shower/ Hygiene: May shower; gently cleanse wound with antibacterial soap, rinse and pat dry prior to dressing wounds WOUND #1: - Hand - Palm Wound Laterality: Right Cleanser: Normal Saline Every Other Day/30 Days Discharge Instructions: Wash your hands with soap and water. Remove old dressing, discard into plastic bag and place into trash. Heather Peterson, Heather Peterson. ([id number]) Cleanse the wound with Normal Saline prior to applying a clean dressing using gauze sponges, not tissues or cotton balls. Do not scrub or use excessive force. Pat dry using gauze sponges, not tissue or cotton balls. Topical: Santyl Collagenase Ointment, 30 (gm), tube Every Other Day/30 Days Discharge Instructions: Apply nickel thick to wound bed only Primary Dressing: Zetuvit Plus Silicone Border Dressing 4x4 (in/in) (Generic) Every Other Day/30 Days Discharge Instructions: Cover wound 1. In office sharp debridement 2. Santyl daily 3. Follow-up in 1 week Electronic Signature(s) Signed: 12/06/2020 12:24:13 PM By: Kalman Shan DO Entered By: Kalman Shan on 12/06/2020 12:23:35 Heather Peterson ([id number]) -------------------------------------------------------------------------------- ROS/PFSH Details Patient Name: Heather Peterson. Date of Service: 12/06/2020 11:30 AM Medical Record Number: [id number] Patient Account Number: [id number] Date of Birth/Sex: Mar 22, 1941 (80 y.o. F) Treating RN: Dolan Amen Primary Care Provider: Thereasa Distance Other Clinician: Referring Provider: Thereasa Distance Treating Provider/Extender: Yaakov Guthrie in Treatment: 2 Information Obtained From Patient Eyes Medical History: Positive for: Cataracts Negative for: Glaucoma; Optic Neuritis Ear/Nose/Mouth/Throat Medical History: Negative for: Chronic sinus problems/congestion; Middle ear  problems Hematologic/Lymphatic Medical History: Positive for: Anemia Negative for: Hemophilia; Human Immunodeficiency Virus; Lymphedema; Sickle Cell Disease Respiratory Medical History: Positive for: Chronic Obstructive Pulmonary Disease (COPD) Negative for: Aspiration; Asthma; Pneumothorax; Sleep Apnea; Tuberculosis Cardiovascular Medical History: Positive for: Congestive Heart Failure; Hypertension; Myocardial Infarction Gastrointestinal Medical History: Negative for: Cirrhosis ; Colitis; Crohnos; Hepatitis A; Hepatitis B; Hepatitis C Endocrine Medical History: Positive for: Type II Diabetes Negative for: Type I Diabetes Time with diabetes: 5 years Treated with: Insulin, Diet Blood sugar tested every day: No Genitourinary Medical History: Positive for: End Stage Renal Disease - CKD stage 3 Immunological Medical History: Negative for: Lupus Erythematosus; Raynaudos; Scleroderma Integumentary (Skin) Heather Peterson, Heather Peterson. ([id number]) Medical History: Negative for: History of Burn; History of pressure wounds Musculoskeletal Medical History: Positive for: Gout; Osteoarthritis Neurologic Medical History: Positive for: Neuropathy Negative for: Dementia; Quadriplegia; Paraplegia; Seizure Disorder Oncologic Medical History: Negative for: Received Chemotherapy; Received Radiation Past Medical History Notes: Multiple myeloma Psychiatric Medical History: Negative for: Anorexia/bulimia; Confinement Anxiety HBO Extended History Items Eyes: Cataracts Immunizations Pneumococcal Vaccine: Received Pneumococcal Vaccination: No Implantable Devices Yes Family and Social History Current every day smoker; Marital Status - Married; Alcohol Use: Never; Drug Use: No History; Caffeine Use: Daily  Electronic Signature(s) Signed: 12/06/2020 12:24:13 PM By: Kalman Shan DO Signed: 12/06/2020 4:59:39 PM By: Dolan Amen RN Entered By: Kalman Shan on 12/06/2020  12:22:04 Heather Peterson ([id number]) -------------------------------------------------------------------------------- [place] Details Patient Name: Heather Peterson, Heather Peterson. Date of Service: 12/06/2020 Medical Record Number: [id number] Patient Account Number: [id number] Date of Birth/Sex: Aug 06, 1940 (80 y.o. F) Treating RN: Dolan Amen Primary Care Provider: Thereasa Distance Other Clinician: Referring Provider: Thereasa Distance Treating Provider/Extender: Yaakov Guthrie in Treatment: 2 Diagnosis Coding ICD-10 Codes Code Description [phone] Laceration without foreign body of right hand, subsequent encounter L98.492 Non-pressure chronic ulcer of skin of other sites with fat layer exposed Facility Procedures CPT4 Code: [id number] Description: [phone] - DEBRIDE WOUND 1ST 20 SQ CM OR < Modifier: Quantity: 1 CPT4 Code: Description: ICD-10 Diagnosis Description L98.492 Non-pressure chronic ulcer of skin of other sites with fat layer exposed Modifier: Quantity: Physician Procedures CPT4 Code: [id number] Description: [place] - WC PHYS DEBR WO ANESTH 20 SQ CM Modifier: Quantity: 1 CPT4 Code: Description: ICD-10 Diagnosis Description L98.492 Non-pressure chronic ulcer of skin of other sites with fat layer exposed Modifier: Quantity: Electronic Signature(s) Signed: 12/06/2020 12:24:13 PM By: Kalman Shan DO Entered By: Kalman Shan on 12/06/2020 12:23:47

## 2020-12-08 ENCOUNTER — Inpatient Hospital Stay: Payer: Medicare Other

## 2020-12-08 VITALS — BP 87/53 | HR 91 | Temp 97.1°F | Resp 16

## 2020-12-08 DIAGNOSIS — Z5111 Encounter for antineoplastic chemotherapy: Secondary | ICD-10-CM | POA: Diagnosis not present

## 2020-12-08 DIAGNOSIS — C9000 Multiple myeloma not having achieved remission: Secondary | ICD-10-CM

## 2020-12-08 LAB — CBC WITH DIFFERENTIAL/PLATELET
Abs Immature Granulocytes: 0.18 10*3/uL — ABNORMAL HIGH (ref 0.00–0.07)
Basophils Absolute: 0 10*3/uL (ref 0.0–0.1)
Basophils Relative: 0 %
Eosinophils Absolute: 0.1 10*3/uL (ref 0.0–0.5)
Eosinophils Relative: 1 %
HCT: 24.5 % — ABNORMAL LOW (ref 36.0–46.0)
Hemoglobin: 7.5 g/dL — ABNORMAL LOW (ref 12.0–15.0)
Immature Granulocytes: 1 %
Lymphocytes Relative: 13 %
Lymphs Abs: 1.8 10*3/uL (ref 0.7–4.0)
MCH: 28.2 pg (ref 26.0–34.0)
MCHC: 30.6 g/dL (ref 30.0–36.0)
MCV: 92.1 fL (ref 80.0–100.0)
Monocytes Absolute: 1 10*3/uL (ref 0.1–1.0)
Monocytes Relative: 8 %
Neutro Abs: 10.6 10*3/uL — ABNORMAL HIGH (ref 1.7–7.7)
Neutrophils Relative %: 77 %
Platelets: 211 10*3/uL (ref 150–400)
RBC: 2.66 MIL/uL — ABNORMAL LOW (ref 3.87–5.11)
RDW: 18.2 % — ABNORMAL HIGH (ref 11.5–15.5)
WBC: 13.7 10*3/uL — ABNORMAL HIGH (ref 4.0–10.5)
nRBC: 1 % — ABNORMAL HIGH (ref 0.0–0.2)

## 2020-12-08 LAB — SAMPLE TO BLOOD BANK

## 2020-12-08 MED ORDER — BORTEZOMIB CHEMO SQ INJECTION 3.5 MG (2.5MG/ML)
1.3000 mg/m2 | Freq: Once | INTRAMUSCULAR | Status: AC
  Administered 2020-12-08: 2.5 mg via SUBCUTANEOUS
  Filled 2020-12-08: qty 1

## 2020-12-08 MED ORDER — DEXAMETHASONE 4 MG PO TABS
20.0000 mg | ORAL_TABLET | Freq: Once | ORAL | Status: AC
  Administered 2020-12-08: 20 mg via ORAL
  Filled 2020-12-08: qty 5

## 2020-12-08 NOTE — Progress Notes (Signed)
Patient reports she is feeling "very weak and tired". States she can hardly keep her eyes open but is not able to sleep well. Reports her eyes are hazy and "full of junk" and that it is hard to read due to haziness. Symptoms started yesterday.  BP 89/52. Dr. Grayland Ormond made aware. Per Dr. Grayland Ormond, proceed with treatment and collect CBC. HGB 7.5. Patient states she is feeling slightly better since resting here. Dr. Grayland Ormond made aware. Per Dr. Grayland Ormond okay to discharge home. Educated patient on s/s as to when to seek emergency care. Patient verbalizes understanding and denies any further questions or concerns.

## 2020-12-10 NOTE — Progress Notes (Signed)
[place]  [phone] [phone]) [phone]  ID: Aubreyana Saltz Jurgensen OB: 1940/05/26  MR#: [id number]  TML#:[id number]  [name], MD as PCP - General (Family Medicine) Lloyd Huger, MD as Consulting Physician (Oncology)  CHIEF COMPLAINT: Multiple myeloma, subdural hematoma.  INTERVAL HISTORY: Patient returns to clinic today for further evaluation and consideration of cycle 5, day 8 of Velcade.  She continues to have increased weakness and fatigue as well as peripheral edema, but otherwise feels well.  She does not complain of any neuropathy today.  She has no neurologic complaints.  She has chronic shortness of breath and requires oxygen 24 hours/day.  She denies any fevers.  She has a fair appetite.  She denies any chest pain, cough, or hemoptysis. She denies any nausea, vomiting, constipation, or diarrhea. She has no melena or hematochezia.  She has no urinary complaints.  Patient offers no further specific complaints today.  REVIEW OF SYSTEMS:   Review of Systems  Constitutional:  Positive for malaise/fatigue. Negative for fever.  Respiratory:  Positive for shortness of breath. Negative for cough and hemoptysis.   Cardiovascular:  Positive for leg swelling. Negative for chest pain.  Gastrointestinal: Negative.  Negative for abdominal pain, diarrhea, melena and nausea.  Genitourinary: Negative.  Negative for dysuria and hematuria.  Musculoskeletal:  Positive for back pain. Negative for joint pain.  Skin: Negative.  Negative for rash.  Neurological:  Positive for weakness. Negative for dizziness, sensory change, focal weakness and headaches.  Psychiatric/Behavioral: Negative.  The patient is not nervous/anxious.    As per HPI. Otherwise, a complete review of systems is negative.  PAST MEDICAL HISTORY: Past Medical History:  Diagnosis Date   Anxiety    Chronic combined systolic and diastolic CHF (congestive heart  failure) (HCC)    Chronic kidney disease    Coronary artery disease    Depression    Diabetes mellitus without complication (HCC)    Diabetes mellitus, type II ([place])    Hypertension    MI (myocardial infarction) ([place])    x 5   Pacemaker    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[place])    Prolonged Q-T interval on ECG    Thyroid disease     PAST SURGICAL HISTORY: Past Surgical History:  Procedure Laterality Date   CENTRAL LINE INSERTION  03/11/2017   Procedure: CENTRAL LINE INSERTION;  Surgeon: Leonie Man, MD;  Location: [place] [facility];  Service: Cardiovascular;;   CHOLECYSTECTOMY     COLONOSCOPY WITH PROPOFOL N/A 09/01/2019   Procedure: COLONOSCOPY WITH PROPOFOL;  Surgeon: Toledo, Benay Pike, MD;  Location: [facility];  Service: Gastroenterology;  Laterality: N/A;   CORONARY STENT INTERVENTION W/IMPELLA N/A 03/11/2017   Procedure: Coronary Stent Intervention w/Impella;  Surgeon: Leonie Man, MD;  Location: [place] [facility];  Service: Cardiovascular;  Laterality: N/A;   ESOPHAGOGASTRODUODENOSCOPY (EGD) WITH PROPOFOL N/A 09/01/2019   Procedure: ESOPHAGOGASTRODUODENOSCOPY (EGD) WITH PROPOFOL;  Surgeon: Toledo, Benay Pike, MD;  Location: [facility];  Service: Gastroenterology;  Laterality: N/A;   ESOPHAGOGASTRODUODENOSCOPY (EGD) WITH PROPOFOL N/A 09/08/2019   Procedure: ESOPHAGOGASTRODUODENOSCOPY (EGD) WITH PROPOFOL;  Surgeon: [name], MD;  Location: [facility];  Service: Gastroenterology;  Laterality: N/A;   EYE SURGERY     HIP ARTHROPLASTY Right 03/29/2020   Procedure: ARTHROPLASTY BIPOLAR HIP (HEMIARTHROPLASTY);  Surgeon: Corky Mull, MD;  Location: [facility];  Service: Orthopedics;  Laterality: Right;   INTRAVASCULAR PRESSURE WIRE/FFR STUDY N/A 03/11/2017   Procedure: INTRAVASCULAR  PRESSURE WIRE/FFR STUDY;  Surgeon: Leonie Man, MD;  Location: [place] [facility];  Service: Cardiovascular;  Laterality: N/A;   INTRAVASCULAR ULTRASOUND/IVUS N/A 03/11/2017    Procedure: Intravascular Ultrasound/IVUS;  Surgeon: Leonie Man, MD;  Location: [place] [facility];  Service: Cardiovascular;  Laterality: N/A;   LEFT HEART CATH AND CORONARY ANGIOGRAPHY N/A 03/05/2017   Procedure: LEFT HEART CATH AND CORONARY ANGIOGRAPHY;  Surgeon: Isaias Cowman, MD;  Location: [place] [facility];  Service: Cardiovascular;  Laterality: N/A;   PACEMAKER IMPLANT     pacemaker/defibrillator Left     FAMILY HISTORY: Family History  Problem Relation Age of Onset   Hypertension Father    Heart attack Father    Depression Sister    Depression Brother    Depression Brother     ADVANCED DIRECTIVES (Y/N):  N  HEALTH MAINTENANCE: Social History   Tobacco Use   Smoking status: Every Day    Packs/day: 0.25    Types: E-cigarettes, Cigarettes   Smokeless tobacco: Never  Vaping Use   Vaping Use: Former  Substance Use Topics   Alcohol use: Not Currently    Comment: occasionally   Drug use: Yes    Comment: prescribed pain meds     Colonoscopy:  PAP:  Bone density:  Lipid panel:  Allergies  Allergen Reactions   Celebrex [Celecoxib] Anaphylaxis   Glipizide Anaphylaxis   Levaquin [Levofloxacin In D5w] Other (See Comments)    Heart arrhthymias   Levofloxacin Other (See Comments) and Palpitations    ICD fired   Lisinopril Swelling    Lip and facial swelling   Sulfa Antibiotics Other (See Comments) and Anaphylaxis    Reaction: unknown   Metformin Other (See Comments)    Gi tolerance    Penicillins Rash and Other (See Comments)    Has patient had a PCN reaction causing immediate rash, facial/tongue/throat swelling, SOB or lightheadedness with hypotension: Unknown Has patient had a PCN reaction causing severe rash involving mucus membranes or skin necrosis: No Has patient had a PCN reaction that required hospitalization: No Has patient had a PCN reaction occurring within the last 10 years: No If all of the above answers are "NO", then may proceed with  Cephalosporin use.     Current Outpatient Medications  Medication Sig Dispense Refill   allopurinol (ZYLOPRIM) 100 MG tablet Take 50 mg by mouth daily.     aspirin EC 81 MG tablet Take 81 mg by mouth at bedtime.      dexamethasone (DECADRON) 4 MG tablet Take 5 tablets (20 mg total) by mouth daily. Take the day after Velcade on days 2,5,9,12. Take with breakfast 40 tablet 3   docusate sodium (COLACE) 100 MG capsule Take 100 mg by mouth 2 (two) times daily.     Ensure Max Protein (ENSURE MAX PROTEIN) LIQD Take 330 mLs (11 oz total) by mouth 2 (two) times daily between meals.     fexofenadine (ALLEGRA) 180 MG tablet Take 180 mg by mouth daily.     FLUoxetine (PROZAC) 10 MG capsule Take 10 mg by mouth daily.     gabapentin (NEURONTIN) 100 MG capsule Take 100 mg by mouth 2 (two) times daily. Patient takes 236m in the morning and 907mat night.     gabapentin (NEURONTIN) 300 MG capsule Take 30067mn AM, 300m48m afternoon, 900mg74mnight (Patient taking differently: Patient takes 200mg 18mhe morning and 900mg a57mght.) 150 capsule 3   gabapentin (NEURONTIN) 600 MG tablet  Take 600 mg by mouth daily. Patient takes 263m in the morning and 90821mat night.     Lactulose 20 GM/30ML SOLN Take 30 mLs (20 g total) by mouth in the morning and at bedtime. 450 mL 1   levothyroxine (SYNTHROID) 50 MCG tablet Take by mouth.     levothyroxine (SYNTHROID) 75 MCG tablet Take 1 tablet (75 mcg total) by mouth daily before breakfast. On Monday, Wednesday and Friday     magnesium oxide (MAG-OX) 400 MG tablet Take 200 mg by mouth daily.     mexiletine (MEXITIL) 200 MG capsule Take 1 capsule (200 mg total) by mouth every 12 (twelve) hours. 60 capsule 1   midodrine (PROAMATINE) 10 MG tablet Take 1 tablet (10 mg total) by mouth 2 (two) times daily with a meal.     OXYGEN Inhale 2 L into the lungs continuous.     pantoprazole (PROTONIX) 40 MG tablet Take 40 mg by mouth daily.      polyethylene glycol (MIRALAX) 17 g  packet Take 17 g by mouth daily. 14 each 0   Prenatal Vit-Fe Fumarate-FA (PRENATAL MULTIVITAMIN) TABS tablet Take 1 tablet by mouth daily.     simvastatin (ZOCOR) 40 MG tablet Take 20 mg by mouth at bedtime.   2   torsemide (DEMADEX) 20 MG tablet Take 20 mg by mouth daily.     traZODone (DESYREL) 50 MG tablet Take 50 mg by mouth at bedtime.     hydrocortisone (ANUSOL-HC) 2.5 % rectal cream Apply 1 application topically 2 (two) times daily. (Patient not taking: No sig reported)     zolpidem (AMBIEN) 5 MG tablet Take 1 tablet (5 mg total) by mouth at bedtime as needed for sleep. (Patient not taking: No sig reported) 30 tablet 0   No current facility-administered medications for this visit.   Facility-Administered Medications Ordered in Other Visits  Medication Dose Route Frequency Provider Last Rate Last Admin   bortezomib SQ (VELCADE) chemo injection (2.21m50mL concentration) 2.5 mg  1.3 mg/m2 (Treatment Plan Recorded) Subcutaneous Once FinLloyd HugerD        OBJECTIVE: Vitals:   12/12/20 0935  BP: 104/62  Pulse: 70  Resp: 16  Temp: (!) 96.1 F (35.6 C)     Body mass index is 28.7 kg/m.    ECOG FS:2 - Symptomatic, <50% confined to bed  General: Well-developed, well-nourished, no acute distress.  Sitting in a wheelchair. Eyes: Pink conjunctiva, anicteric sclera. HEENT: Normocephalic, moist mucous membranes. Lungs: No audible wheezing or coughing. Heart: Regular rate and rhythm. Abdomen: Soft, nontender, no obvious distention. Musculoskeletal: 2+ bilateral lower extremity edema. Neuro: Alert, answering all questions appropriately. Cranial nerves grossly intact. Skin: No rashes or petechiae noted. Psych: Normal affect.  LAB RESULTS:  Lab Results  Component Value Date   NA 135 12/12/2020   K 4.1 12/12/2020   CL 94 (L) 12/12/2020   CO2 32 12/12/2020   GLUCOSE 128 (H) 12/12/2020   BUN 65 (H) 12/12/2020   CREATININE 1.53 (H) 12/12/2020   CALCIUM 8.5 (L) 12/12/2020    PROT 6.8 12/12/2020   ALBUMIN 3.8 12/12/2020   AST 18 12/12/2020   ALT 21 12/12/2020   ALKPHOS 92 12/12/2020   BILITOT 0.5 12/12/2020   GFRNONAA 34 (L) 12/12/2020   GFRAA 30 (L) 09/09/2019    Lab Results  Component Value Date   WBC 8.6 12/12/2020   NEUTROABS 6.6 12/12/2020   HGB 7.9 (L) 12/12/2020   HCT 26.6 (L) 12/12/2020  MCV 92.0 12/12/2020   PLT 134 (L) 12/12/2020   Lab Results  Component Value Date   IRON 85 05/23/2020   TIBC 421 05/23/2020   IRONPCTSAT 20 05/23/2020   Lab Results  Component Value Date   FERRITIN 50 05/23/2020     STUDIES: No results found.  ASSESSMENT: Multiple myeloma, fractured right hip.  PLAN:    1.  Multiple myeloma: Bone marrow biopsy confirmed diagnosis with plasma cells up to 90% of biopsy.  Cytogenetics are reported as normal.  Metastatic bone survey from March 03, 2020 reviewed independently with multiple skeletal lucencies consistent with myeloma. Her M spike was initially 1.2 and initially trended down to 0.1, but more recently has trended up to 0.7 IgA levels initially were 2773, trended down to normal, but now increased to 1197. Kappa and lambda free light chains continue to be within normal limits. Initial plan was to give Velcade on days 1, 4, 8, and 11 along with 10 mg Revlimid on days 1 through 14. Patient will also receive weekly 20 mg dexamethasone.  Revlimid was discontinued secondary to intolerance.  Patient did not receive treatment between June 20, 2020 and November 14, 2020.  Proceed with cycle 5, day 8 of single agent Velcade today.  Return to clinic Friday for treatment only and then in 2 weeks for further evaluation and consideration of cycle 6, day 1.   2.  Anemia: Hemoglobin has improved to 7.9 with 1 unit of packed red blood cells.  She does not require an additional transfusion at this time.  Likely multifactorial with chronic renal insufficiency, underlying myeloma, as well as history of recent GI bleed.  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  3.  Chronic renal insufficiency: Creatinine slightly improved today to 1.53. 4.  Ascites/peripheral edema: Slightly worse today.  Patient was instructed to add a second dose of torsemide in the afternoon as needed.  A referral was sent back to heart failure clinic for evaluation.  5.  Angiodysplastic lesions: Continue follow-up with GI as scheduled. 6. Constipation: Patient states she has not had a significant bowel movement in several weeks.  Continue MiraLAX and lactulose as needed. 7.  Right hip fracture: Continue rehab and follow-up with orthopedics as scheduled. Continue Percocet as needed. 8. Thrombocytopenia: Mild, proceed with treatment as above. 9.  Peripheral neuropathy: Continue 700 mg gabapentin at night in addition to 100 mg in the morning and in the afternoon.   10.  Right foot pain: Patient does not complain of this today.  Continue hydrocodone as needed.  Patient expressed understanding and was in agreement with this plan. She also understands that She can call clinic at any time with any questions, concerns, or complaints.    Lloyd Huger, MD   12/12/2020 10:41 AM

## 2020-12-12 ENCOUNTER — Inpatient Hospital Stay: Payer: Medicare Other | Attending: Oncology

## 2020-12-12 ENCOUNTER — Encounter: Payer: Self-pay | Admitting: Oncology

## 2020-12-12 ENCOUNTER — Inpatient Hospital Stay (HOSPITAL_BASED_OUTPATIENT_CLINIC_OR_DEPARTMENT_OTHER): Payer: Medicare Other | Admitting: Oncology

## 2020-12-12 ENCOUNTER — Other Ambulatory Visit: Payer: Self-pay

## 2020-12-12 ENCOUNTER — Inpatient Hospital Stay: Payer: Medicare Other

## 2020-12-12 VITALS — BP 104/62 | HR 70 | Temp 96.1°F | Resp 16 | Wt 162.0 lb

## 2020-12-12 DIAGNOSIS — C9000 Multiple myeloma not having achieved remission: Secondary | ICD-10-CM | POA: Diagnosis present

## 2020-12-12 DIAGNOSIS — Z79899 Other long term (current) drug therapy: Secondary | ICD-10-CM | POA: Diagnosis not present

## 2020-12-12 DIAGNOSIS — Z5111 Encounter for antineoplastic chemotherapy: Secondary | ICD-10-CM | POA: Diagnosis not present

## 2020-12-12 DIAGNOSIS — N189 Chronic kidney disease, unspecified: Secondary | ICD-10-CM | POA: Insufficient documentation

## 2020-12-12 DIAGNOSIS — D649 Anemia, unspecified: Secondary | ICD-10-CM | POA: Diagnosis not present

## 2020-12-12 DIAGNOSIS — I509 Heart failure, unspecified: Secondary | ICD-10-CM

## 2020-12-12 LAB — CBC WITH DIFFERENTIAL/PLATELET
Abs Immature Granulocytes: 0.14 10*3/uL — ABNORMAL HIGH (ref 0.00–0.07)
Basophils Absolute: 0 10*3/uL (ref 0.0–0.1)
Basophils Relative: 0 %
Eosinophils Absolute: 0.1 10*3/uL (ref 0.0–0.5)
Eosinophils Relative: 1 %
HCT: 26.6 % — ABNORMAL LOW (ref 36.0–46.0)
Hemoglobin: 7.9 g/dL — ABNORMAL LOW (ref 12.0–15.0)
Immature Granulocytes: 2 %
Lymphocytes Relative: 12 %
Lymphs Abs: 1 10*3/uL (ref 0.7–4.0)
MCH: 27.3 pg (ref 26.0–34.0)
MCHC: 29.7 g/dL — ABNORMAL LOW (ref 30.0–36.0)
MCV: 92 fL (ref 80.0–100.0)
Monocytes Absolute: 0.7 10*3/uL (ref 0.1–1.0)
Monocytes Relative: 9 %
Neutro Abs: 6.6 10*3/uL (ref 1.7–7.7)
Neutrophils Relative %: 76 %
Platelets: 134 10*3/uL — ABNORMAL LOW (ref 150–400)
RBC: 2.89 MIL/uL — ABNORMAL LOW (ref 3.87–5.11)
RDW: 18 % — ABNORMAL HIGH (ref 11.5–15.5)
WBC: 8.6 10*3/uL (ref 4.0–10.5)
nRBC: 0.9 % — ABNORMAL HIGH (ref 0.0–0.2)

## 2020-12-12 LAB — COMPREHENSIVE METABOLIC PANEL
ALT: 21 U/L (ref 0–44)
AST: 18 U/L (ref 15–41)
Albumin: 3.8 g/dL (ref 3.5–5.0)
Alkaline Phosphatase: 92 U/L (ref 38–126)
Anion gap: 9 (ref 5–15)
BUN: 65 mg/dL — ABNORMAL HIGH (ref 8–23)
CO2: 32 mmol/L (ref 22–32)
Calcium: 8.5 mg/dL — ABNORMAL LOW (ref 8.9–10.3)
Chloride: 94 mmol/L — ABNORMAL LOW (ref 98–111)
Creatinine, Ser: 1.53 mg/dL — ABNORMAL HIGH (ref 0.44–1.00)
GFR, Estimated: 34 mL/min — ABNORMAL LOW (ref >60)
Glucose, Bld: 128 mg/dL — ABNORMAL HIGH (ref 70–99)
Potassium: 4.1 mmol/L (ref 3.5–5.1)
Sodium: 135 mmol/L (ref 135–145)
Total Bilirubin: 0.5 mg/dL (ref 0.3–1.2)
Total Protein: 6.8 g/dL (ref 6.5–8.1)

## 2020-12-12 LAB — SAMPLE TO BLOOD BANK

## 2020-12-12 MED ORDER — BORTEZOMIB CHEMO SQ INJECTION 3.5 MG (2.5MG/ML)
1.3000 mg/m2 | Freq: Once | INTRAMUSCULAR | Status: AC
  Administered 2020-12-12: 2.5 mg via SUBCUTANEOUS
  Filled 2020-12-12: qty 1

## 2020-12-12 MED ORDER — DEXAMETHASONE 4 MG PO TABS
20.0000 mg | ORAL_TABLET | Freq: Once | ORAL | Status: AC
  Administered 2020-12-12: 20 mg via ORAL
  Filled 2020-12-12: qty 5

## 2020-12-12 NOTE — Progress Notes (Signed)
Patient is very fatigued causing difficulty in walking.  Ankle swelling is not new but not improving.  She is constipated with last bowel movement for the last 3-4 weeks that is not improved with Lactulose or Miralax.

## 2020-12-12 NOTE — Patient Instructions (Signed)
[facility]  Discharge Instructions: Thank you for choosing [facility] to provide your oncology and hematology care.  If you have a lab appointment with the [facility], please go directly to the [facility] and check in at the registration area.  Wear comfortable clothing and clothing appropriate for easy access to any Portacath or PICC line.   We strive to give you quality time with your provider. You may need to reschedule your appointment if you arrive late (15 or more minutes).  Arriving late affects you and other patients whose appointments are after yours.  Also, if you miss three or more appointments without notifying the office, you may be dismissed from the clinic at the provider's discretion.      For prescription refill requests, have your pharmacy contact our office and allow 72 hours for refills to be completed.    Today you received the following chemotherapy and/or immunotherapy agents - Velcade      To help prevent nausea and vomiting after your treatment, we encourage you to take your nausea medication as directed.  BELOW ARE SYMPTOMS THAT SHOULD BE REPORTED IMMEDIATELY: *FEVER GREATER THAN 100.4 F (38 C) OR HIGHER *CHILLS OR SWEATING *NAUSEA AND VOMITING THAT IS NOT CONTROLLED WITH YOUR NAUSEA MEDICATION *UNUSUAL SHORTNESS OF BREATH *UNUSUAL BRUISING OR BLEEDING *URINARY PROBLEMS (pain or burning when urinating, or frequent urination) *BOWEL PROBLEMS (unusual diarrhea, constipation, pain near the anus) TENDERNESS IN MOUTH AND THROAT WITH OR WITHOUT PRESENCE OF ULCERS (sore throat, sores in mouth, or a toothache) UNUSUAL RASH, SWELLING OR PAIN  UNUSUAL VAGINAL DISCHARGE OR ITCHING   Items with * indicate a potential emergency and should be followed up as soon as possible or go to the Emergency Department if any problems should occur.  Please show the CHEMOTHERAPY ALERT CARD or IMMUNOTHERAPY ALERT CARD at check-in  to the Emergency Department and triage nurse.  Should you have questions after your visit or need to cancel or reschedule your appointment, please contact [facility]  [phone] and follow the prompts.  Office hours are 8:00 a.m. to 4:30 p.m. Monday - Friday. Please note that voicemails left after 4:00 p.m. may not be returned until the following business day.  We are closed weekends and major holidays. You have access to a nurse at all times for urgent questions. Please call the main number to the clinic [phone] and follow the prompts.  For any non-urgent questions, you may also contact your provider using MyChart. We now offer e-Visits for anyone 62 and older to request care online for non-urgent symptoms. For details visit [email or web address].   Also download the MyChart app! Go to the app store, search "MyChart", open the app, select [facility], and log in with your MyChart username and password.  Due to Covid, a mask is required upon entering the hospital/clinic. If you do not have a mask, one will be given to you upon arrival. For doctor visits, patients may have 1 support person aged 42 or older with them. For treatment visits, patients cannot have anyone with them due to current Covid guidelines and our immunocompromised population.

## 2020-12-13 ENCOUNTER — Encounter: Payer: Medicare Other | Attending: Internal Medicine | Admitting: Internal Medicine

## 2020-12-13 DIAGNOSIS — L98492 Non-pressure chronic ulcer of skin of other sites with fat layer exposed: Secondary | ICD-10-CM

## 2020-12-13 DIAGNOSIS — S61401A Unspecified open wound of right hand, initial encounter: Secondary | ICD-10-CM | POA: Diagnosis not present

## 2020-12-13 DIAGNOSIS — I509 Heart failure, unspecified: Secondary | ICD-10-CM | POA: Diagnosis not present

## 2020-12-13 DIAGNOSIS — S61411D Laceration without foreign body of right hand, subsequent encounter: Secondary | ICD-10-CM | POA: Diagnosis not present

## 2020-12-13 DIAGNOSIS — I132 Hypertensive heart and chronic kidney disease with heart failure and with stage 5 chronic kidney disease, or end stage renal disease: Secondary | ICD-10-CM | POA: Insufficient documentation

## 2020-12-13 DIAGNOSIS — X58XXXA Exposure to other specified factors, initial encounter: Secondary | ICD-10-CM | POA: Insufficient documentation

## 2020-12-13 DIAGNOSIS — E11622 Type 2 diabetes mellitus with other skin ulcer: Secondary | ICD-10-CM | POA: Insufficient documentation

## 2020-12-13 DIAGNOSIS — E1140 Type 2 diabetes mellitus with diabetic neuropathy, unspecified: Secondary | ICD-10-CM | POA: Insufficient documentation

## 2020-12-13 DIAGNOSIS — F17200 Nicotine dependence, unspecified, uncomplicated: Secondary | ICD-10-CM | POA: Insufficient documentation

## 2020-12-13 DIAGNOSIS — E1122 Type 2 diabetes mellitus with diabetic chronic kidney disease: Secondary | ICD-10-CM | POA: Insufficient documentation

## 2020-12-13 DIAGNOSIS — N186 End stage renal disease: Secondary | ICD-10-CM | POA: Insufficient documentation

## 2020-12-13 DIAGNOSIS — E1151 Type 2 diabetes mellitus with diabetic peripheral angiopathy without gangrene: Secondary | ICD-10-CM | POA: Diagnosis not present

## 2020-12-13 LAB — IGG, IGA, IGM
IgA: 611 mg/dL — ABNORMAL HIGH (ref 64–422)
IgG (Immunoglobin G), Serum: 152 mg/dL — ABNORMAL LOW (ref 586–1602)
IgM (Immunoglobulin M), Srm: <5 mg/dL — ABNORMAL LOW (ref 26–217)

## 2020-12-13 LAB — KAPPA/LAMBDA LIGHT CHAINS
Kappa free light chain: 3.4 mg/L (ref 3.3–19.4)
Kappa, lambda light chain ratio: 0.94 (ref 0.26–1.65)
Lambda free light chains: 3.6 mg/L — ABNORMAL LOW (ref 5.7–26.3)

## 2020-12-13 NOTE — Progress Notes (Signed)
JAYLANIE, BOSCHEE ([id number]) Visit Report for 12/13/2020 Chief Complaint Document Details Patient Name: Heather Peterson, Heather Peterson. Date of Service: 12/13/2020 9:00 AM Medical Record Number: [id number] Patient Account Number: [id number] Date of Birth/Sex: 1940/10/15 (80 y.o. F) Treating RN: Dolan Amen Primary Care Provider: Thereasa Distance Other Clinician: Referring Provider: Thereasa Distance Treating Provider/Extender: Yaakov Guthrie in Treatment: 3 Information Obtained from: Patient Chief Complaint Right hand wound Electronic Signature(s) Signed: 12/13/2020 10:19:18 AM By: Kalman Shan DO Entered By: Kalman Shan on 12/13/2020 10:15:35 Heather Peterson ([id number]) -------------------------------------------------------------------------------- Debridement Details Patient Name: Heather Peterson. Date of Service: 12/13/2020 9:00 AM Medical Record Number: [id number] Patient Account Number: [id number] Date of Birth/Sex: [birth year] (80 y.o. F) Treating RN: Dolan Amen Primary Care Provider: Thereasa Distance Other Clinician: Referring Provider: Thereasa Distance Treating Provider/Extender: Yaakov Guthrie in Treatment: 3 Debridement Performed for Wound #1 Right Hand - Palm Assessment: Performed By: Physician Kalman Shan, MD Debridement Type: Chemical/Enzymatic/Mechanical Agent Used: Santyl Level of Consciousness (Pre- Awake and Alert procedure): Pre-procedure Verification/Time Out Yes - 09:56 Taken: Start Time: 09:56 Instrument: Other : cotton tipped applicator Bleeding: None Response to Treatment: Procedure was tolerated well Level of Consciousness (Post- Awake and Alert procedure): Post Debridement Measurements of Total Wound Length: (cm) 1 Width: (cm) 0.6 Depth: (cm) 0.3 Volume: (cm) 0.141 Character of Wound/Ulcer Post Debridement: Stable Post Procedure Diagnosis Same as Pre-procedure Electronic Signature(s) Signed: 12/13/2020  10:19:18 AM By: Kalman Shan DO Signed: 12/13/2020 1:16:47 PM By: Donnamarie Poag Signed: 12/13/2020 4:40:35 PM By: Dolan Amen RN Entered By: Dolan Amen on 12/13/2020 [place], [place] ([id number]) -------------------------------------------------------------------------------- HPI Details Patient Name: Heather Peterson, Heather Peterson. Date of Service: 12/13/2020 9:00 AM Medical Record Number: [id number] Patient Account Number: [id number] Date of Birth/Sex: 10-07-1940 (80 y.o. F) Treating RN: Dolan Amen Primary Care Provider: Thereasa Distance Other Clinician: Referring Provider: Thereasa Distance Treating Provider/Extender: Yaakov Guthrie in Treatment: 3 History of Present Illness HPI Description: Admission 7/13 Ms. Loria Lacina is a 80 year old female with a past medical history of type 2 diabetes on oral agents, COPD and CKD stage III that presents to the clinic for a right hand wound. She reports that 2 weeks ago she cut her hand on her walker. She visited the ED and they placed sutures. She returned to the ED 10 days later to have these removed. Unfortunately the area dehisced and at that time the skin flap had darkness along the edges. She has been using mupirocin cream on it daily. She currently denies signs of infection. 11/27/20 upon evaluation today patient appears for reevaluation here in the clinic due to issues she's been having with the laceration on the palm of her hand immediately. With that being said there is a basically crescent laceration noted here there is a portion of the flap which is actually to chronic and this is going to need to be cleaned away based on what I see. Fortunately there does not appear to be any evidence of active infection which is great news she does have quite a bit of pain however. Initially we were hoping to be able to debride some this way without having to know her but eventually did have to use lidocaine to numb the area. 7/27;  patient presents for 1 week follow-up. She has been using collagen to the wound bed daily. She has no issues or complaints today. She denies signs of infection. 8/3; patient presents for 1 week follow-up. She has been using Santyl to the wound bed Daily.  She denies signs of infection. She has no issues or complaints today. Electronic Signature(s) Signed: 12/13/2020 10:19:18 AM By: Kalman Shan DO Entered By: Kalman Shan on 12/13/2020 10:16:29 Heather Peterson ([id number]) -------------------------------------------------------------------------------- Physical Exam Details Patient Name: Heather Peterson, Heather Peterson. Date of Service: 12/13/2020 9:00 AM Medical Record Number: [id number] Patient Account Number: [id number] Date of Birth/Sex: Dec 17, 1940 (80 y.o. F) Treating RN: Dolan Amen Primary Care Provider: Thereasa Distance Other Clinician: Referring Provider: Thereasa Distance Treating Provider/Extender: Yaakov Guthrie in Treatment: 3 Constitutional . Psychiatric . Notes Right hand: Palmar aspect with crescent like wound with slough and granulation tissue present. No signs of infection. Electronic Signature(s) Signed: 12/13/2020 10:19:18 AM By: Kalman Shan DO Entered By: Kalman Shan on 12/13/2020 10:17:52 Heather Peterson ([id number]) -------------------------------------------------------------------------------- Physician Orders Details Patient Name: Heather Peterson, Heather Peterson. Date of Service: 12/13/2020 9:00 AM Medical Record Number: [id number] Patient Account Number: [id number] Date of Birth/Sex: 04-09-1941 (80 y.o. F) Treating RN: Dolan Amen Primary Care Provider: Thereasa Distance Other Clinician: Referring Provider: Thereasa Distance Treating Provider/Extender: Yaakov Guthrie in Treatment: 3 Verbal / Phone Orders: No Diagnosis Coding Follow-up Appointments o Return Appointment in 2 weeks. Bathing/ Shower/ Hygiene o May shower; gently cleanse  wound with antibacterial soap, rinse and pat dry prior to dressing wounds Wound Treatment Wound #1 - Hand - Palm Wound Laterality: Right Cleanser: Normal Saline 1 x Per Day/30 Days Discharge Instructions: Wash your hands with soap and water. Remove old dressing, discard into plastic bag and place into trash. Cleanse the wound with Normal Saline prior to applying a clean dressing using gauze sponges, not tissues or cotton balls. Do not scrub or use excessive force. Pat dry using gauze sponges, not tissue or cotton balls. Topical: Santyl Collagenase Ointment, 30 (gm), tube 1 x Per Day/30 Days Discharge Instructions: Apply nickel thick to wound bed only Primary Dressing: Zetuvit Plus Silicone Border Dressing 4x4 (in/in) (DME) (Generic) 1 x Per Day/30 Days Discharge Instructions: Cover wound Electronic Signature(s) Signed: 12/13/2020 10:19:18 AM By: Kalman Shan DO Signed: 12/13/2020 1:16:47 PM By: Donnamarie Poag Signed: 12/13/2020 4:40:35 PM By: Dolan Amen RN Entered By: Dolan Amen on 12/13/2020 10:06:30 Heather Peterson ([id number]) -------------------------------------------------------------------------------- Problem List Details Patient Name: Heather Peterson, Heather Peterson. Date of Service: 12/13/2020 9:00 AM Medical Record Number: [id number] Patient Account Number: [id number] Date of Birth/Sex: March 13, 1941 (80 y.o. F) Treating RN: Dolan Amen Primary Care Provider: Thereasa Distance Other Clinician: Referring Provider: Thereasa Distance Treating Provider/Extender: Yaakov Guthrie in Treatment: 3 Active Problems ICD-10 Encounter Code Description Active Date MDM Diagnosis [phone] Laceration without foreign body of right hand, subsequent encounter 11/22/2020 No Yes L98.492 Non-pressure chronic ulcer of skin of other sites with fat layer exposed 11/22/2020 No Yes Inactive Problems Resolved Problems Electronic Signature(s) Signed: 12/13/2020 10:19:18 AM By: Kalman Shan  DO Entered By: Kalman Shan on 12/13/2020 10:15:20 Heather Peterson ([id number]) -------------------------------------------------------------------------------- Progress Note Details Patient Name: Heather Peterson. Date of Service: 12/13/2020 9:00 AM Medical Record Number: [id number] Patient Account Number: [id number] Date of Birth/Sex: 1941/03/30 (80 y.o. F) Treating RN: Dolan Amen Primary Care Provider: Thereasa Distance Other Clinician: Referring Provider: Thereasa Distance Treating Provider/Extender: Yaakov Guthrie in Treatment: 3 Subjective Chief Complaint Information obtained from Patient Right hand wound History of Present Illness (HPI) Admission 7/13 Ms. Nitara Szczerba is a 80 year old female with a past medical history of type 2 diabetes on oral agents, COPD and CKD stage III that presents to the clinic for a right hand wound. She reports that 2  weeks ago she cut her hand on her walker. She visited the ED and they placed sutures. She returned to the ED 10 days later to have these removed. Unfortunately the area dehisced and at that time the skin flap had darkness along the edges. She has been using mupirocin cream on it daily. She currently denies signs of infection. 11/27/20 upon evaluation today patient appears for reevaluation here in the clinic due to issues she's been having with the laceration on the palm of her hand immediately. With that being said there is a basically crescent laceration noted here there is a portion of the flap which is actually to chronic and this is going to need to be cleaned away based on what I see. Fortunately there does not appear to be any evidence of active infection which is great news she does have quite a bit of pain however. Initially we were hoping to be able to debride some this way without having to know her but eventually did have to use lidocaine to numb the area. 7/27; patient presents for 1 week follow-up. She has  been using collagen to the wound bed daily. She has no issues or complaints today. She denies signs of infection. 8/3; patient presents for 1 week follow-up. She has been using Santyl to the wound bed Daily. She denies signs of infection. She has no issues or complaints today. Patient History Information obtained from Patient. Social History Current every day smoker, Marital Status - Married, Alcohol Use - Never, Drug Use - No History, Caffeine Use - Daily. Medical History Eyes Patient has history of Cataracts Denies history of Glaucoma, Optic Neuritis Ear/Nose/Mouth/Throat Denies history of Chronic sinus problems/congestion, Middle ear problems Hematologic/Lymphatic Patient has history of Anemia Denies history of Hemophilia, Human Immunodeficiency Virus, Lymphedema, Sickle Cell Disease Respiratory Patient has history of Chronic Obstructive Pulmonary Disease (COPD) Denies history of Aspiration, Asthma, Pneumothorax, Sleep Apnea, Tuberculosis Cardiovascular Patient has history of Congestive Heart Failure, Hypertension, Myocardial Infarction Gastrointestinal Denies history of Cirrhosis , Colitis, Crohn s, Hepatitis A, Hepatitis B, Hepatitis C Endocrine Patient has history of Type II Diabetes Denies history of Type I Diabetes Genitourinary Patient has history of End Stage Renal Disease - CKD stage 3 Immunological Denies history of Lupus Erythematosus, Raynaud s, Scleroderma Integumentary (Skin) Denies history of History of Burn, History of pressure wounds Musculoskeletal Patient has history of Gout, Osteoarthritis Neurologic Patient has history of Neuropathy Denies history of Dementia, Quadriplegia, Paraplegia, Seizure Disorder Oncologic Denies history of Received Chemotherapy, Received Radiation Psychiatric Denies history of Anorexia/bulimia, Confinement Anxiety Heather Peterson, Heather Peterson ([id number]) Medical And Surgical History Notes Oncologic Multiple  myeloma Objective Constitutional Vitals Time Taken: 9:18 AM, Height: 63 in, Weight: 156 lbs, BMI: 27.6, Temperature: 97.6 F, Pulse: 86 bpm, Respiratory Rate: 18 breaths/min, Blood Pressure: 100/63 mmHg. General Notes: Right hand: Palmar aspect with crescent like wound with slough and granulation tissue present. No signs of infection. Integumentary (Hair, Skin) Wound #1 status is Open. Original cause of wound was Skin Tear/Laceration. The date acquired was: 11/08/2020. The wound has been in treatment 3 weeks. The wound is located on the Right Hand - Palm. The wound measures 1cm length x 0.6cm width x 0.3cm depth; 0.471cm^2 area and 0.141cm^3 volume. There is Fat Layer (Subcutaneous Tissue) exposed. There is no tunneling or undermining noted. There is a medium amount of serosanguineous drainage noted. There is medium (34-66%) pink granulation within the wound bed. There is a medium (34-66%) amount of necrotic tissue within the wound bed  including [place]. Assessment Active Problems ICD-10 Laceration without foreign body of right hand, subsequent encounter Non-pressure chronic ulcer of skin of other sites with fat layer exposed Patient presents for 1 week follow-up. She has shown improvement in size and appearance of the wound since last clinic visit. No signs of infection. I recommended continuing Santyl for the next 2 weeks and daily with dressing changes. Procedures Wound #1 Pre-procedure diagnosis of Wound #1 is a Dehisced Wound located on the Right Hand - Palm . There was a Chemical/Enzymatic/Mechanical debridement performed by Kalman Shan, MD. With the following instrument(s): cotton tipped applicator. Agent used was [employer]. A time out was conducted at 09:56, prior to the start of the procedure. There was no bleeding. The procedure was tolerated well. Post Debridement Measurements: 1cm length x 0.6cm width x 0.3cm depth; 0.141cm^3 volume. Character of Wound/Ulcer Post  Debridement is stable. Post procedure Diagnosis Wound #1: Same as Pre-Procedure Plan Follow-up Appointments: Return Appointment in 2 weeks. Bathing/ Shower/ Hygiene: May shower; gently cleanse wound with antibacterial soap, rinse and pat dry prior to dressing wounds WOUND #1: - Hand - Palm Wound Laterality: Right Cleanser: Normal Saline 1 x Per Day/30 Days Heather Peterson, Heather Peterson ([id number]) Discharge Instructions: Wash your hands with soap and water. Remove old dressing, discard into plastic bag and place into trash. Cleanse the wound with Normal Saline prior to applying a clean dressing using gauze sponges, not tissues or cotton balls. Do not scrub or use excessive force. Pat dry using gauze sponges, not tissue or cotton balls. Topical: Santyl Collagenase Ointment, 30 (gm), tube 1 x Per Day/30 Days Discharge Instructions: Apply nickel thick to wound bed only Primary Dressing: Zetuvit Plus Silicone Border Dressing 4x4 (in/in) (DME) (Generic) 1 x Per Day/30 Days Discharge Instructions: Cover wound 1. Continue Santyl daily 2. Follow-up in 2 weeks Electronic Signature(s) Signed: 12/13/2020 10:19:18 AM By: Kalman Shan DO Entered By: Kalman Shan on 12/13/2020 10:18:43 Heather Peterson ([id number]) -------------------------------------------------------------------------------- ROS/PFSH Details Patient Name: Heather Peterson. Date of Service: 12/13/2020 9:00 AM Medical Record Number: [id number] Patient Account Number: [id number] Date of Birth/Sex: 10/28/1940 (80 y.o. F) Treating RN: Dolan Amen Primary Care Provider: Thereasa Distance Other Clinician: Referring Provider: Thereasa Distance Treating Provider/Extender: Yaakov Guthrie in Treatment: 3 Information Obtained From Patient Eyes Medical History: Positive for: Cataracts Negative for: Glaucoma; Optic Neuritis Ear/Nose/Mouth/Throat Medical History: Negative for: Chronic sinus problems/congestion; Middle ear  problems Hematologic/Lymphatic Medical History: Positive for: Anemia Negative for: Hemophilia; Human Immunodeficiency Virus; Lymphedema; Sickle Cell Disease Respiratory Medical History: Positive for: Chronic Obstructive Pulmonary Disease (COPD) Negative for: Aspiration; Asthma; Pneumothorax; Sleep Apnea; Tuberculosis Cardiovascular Medical History: Positive for: Congestive Heart Failure; Hypertension; Myocardial Infarction Gastrointestinal Medical History: Negative for: Cirrhosis ; Colitis; Crohnos; Hepatitis A; Hepatitis B; Hepatitis C Endocrine Medical History: Positive for: Type II Diabetes Negative for: Type I Diabetes Time with diabetes: 5 years Treated with: Insulin, Diet Blood sugar tested every day: No Genitourinary Medical History: Positive for: End Stage Renal Disease - CKD stage 3 Immunological Medical History: Negative for: Lupus Erythematosus; Raynaudos; Scleroderma Integumentary (Skin) ZIVAH, MAYR. ([id number]) Medical History: Negative for: History of Burn; History of pressure wounds Musculoskeletal Medical History: Positive for: Gout; Osteoarthritis Neurologic Medical History: Positive for: Neuropathy Negative for: Dementia; Quadriplegia; Paraplegia; Seizure Disorder Oncologic Medical History: Negative for: Received Chemotherapy; Received Radiation Past Medical History Notes: Multiple myeloma Psychiatric Medical History: Negative for: Anorexia/bulimia; Confinement Anxiety HBO Extended History Items Eyes: Cataracts Immunizations Pneumococcal Vaccine: Received Pneumococcal Vaccination: No Implantable Devices  Yes Family and Social History Current every day smoker; Marital Status - Married; Alcohol Use: Never; Drug Use: No History; Caffeine Use: Daily Electronic Signature(s) Signed: 12/13/2020 10:19:18 AM By: Kalman Shan DO Signed: 12/13/2020 1:16:47 PM By: Donnamarie Poag Signed: 12/13/2020 4:40:35 PM By: Dolan Amen RN Entered By:  Kalman Shan on 12/13/2020 10:17:34 Heather Peterson, Heather Peterson ([id number]) -------------------------------------------------------------------------------- [place] Details Patient Name: Heather Peterson, Heather Peterson. Date of Service: 12/13/2020 Medical Record Number: [id number] Patient Account Number: [id number] Date of Birth/Sex: 04-24-1941 (80 y.o. F) Treating RN: Dolan Amen Primary Care Provider: Thereasa Distance Other Clinician: Referring Provider: Thereasa Distance Treating Provider/Extender: Yaakov Guthrie in Treatment: 3 Diagnosis Coding ICD-10 Codes Code Description [phone] Laceration without foreign body of right hand, subsequent encounter L98.492 Non-pressure chronic ulcer of skin of other sites with fat layer exposed Facility Procedures CPT4 Code: [id number] Description: [phone] - DEBRIDE W/O ANES NON SELECT Modifier: Quantity: 1 Physician Procedures CPT4 Code: 3225672 Description: 09198 - WC PHYS LEVEL 3 - EST PT Modifier: Quantity: 1 CPT4 Code: Description: ICD-10 Diagnosis Description S61.411D Laceration without foreign body of right hand, subsequent encounter L98.492 Non-pressure chronic ulcer of skin of other sites with fat layer expos Modifier: ed Quantity: Electronic Signature(s) Signed: 12/13/2020 10:19:18 AM By: Kalman Shan DO Entered By: Kalman Shan on 12/13/2020 10:18:56

## 2020-12-13 NOTE — Progress Notes (Signed)
BODHI, STENGLEIN ([id number]) Visit Report for 12/13/2020 Arrival Information Details Patient Name: Heather Peterson, Heather Peterson. Date of Service: 12/13/2020 9:00 AM Medical Record Number: [id number] Patient Account Number: [id number] Date of Birth/Sex: 11-11-1940 (80 y.o. F) Treating RN: Dolan Amen Primary Care Niyonna Betsill: Thereasa Distance Other Clinician: Referring Peder Allums: Thereasa Distance Treating Svetlana Bagby/Extender: Yaakov Guthrie in Treatment: 3 Visit Information History Since Last Visit Pain Present Now: No Patient Arrived: Walker Arrival Time: 09:17 Accompanied By: husband Transfer Assistance: None Patient Identification Verified: Yes Secondary Verification Process Completed: Yes Patient Has Alerts: Yes Patient Alerts: Type II Diabetic PACEMAKER Electronic Signature(s) Signed: 12/13/2020 1:16:47 PM By: Donnamarie Poag Signed: 12/13/2020 4:40:35 PM By: Dolan Amen RN Entered By: Dolan Amen on 12/13/2020 09:18:22 [name] ([id number]) -------------------------------------------------------------------------------- Clinic Level of Care Assessment Details Patient Name: Heather Peterson. Date of Service: 12/13/2020 9:00 AM Medical Record Number: [id number] Patient Account Number: [id number] Date of Birth/Sex: 28-May-1940 (80 y.o. F) Treating RN: Dolan Amen Primary Care Quinita Kostelecky: Thereasa Distance Other Clinician: Referring Chloe Flis: Thereasa Distance Treating Zyriah Mask/Extender: Yaakov Guthrie in Treatment: 3 Clinic Level of Care Assessment Items TOOL 1 Quantity Score []  - Use when EandM and Procedure is performed on INITIAL visit 0 ASSESSMENTS - Nursing Assessment / Reassessment []  - General Physical Exam (combine w/ comprehensive assessment (listed just below) when performed on new 0 pt. evals) []  - 0 Comprehensive Assessment (HX, ROS, Risk Assessments, Wounds Hx, etc.) ASSESSMENTS - Wound and Skin Assessment / Reassessment []  - Dermatologic /  Skin Assessment (not related to wound area) 0 ASSESSMENTS - Ostomy and/or Continence Assessment and Care []  - Incontinence Assessment and Management 0 []  - 0 Ostomy Care Assessment and Management (repouching, etc.) PROCESS - Coordination of Care []  - Simple Patient / Family Education for ongoing care 0 []  - 0 Complex (extensive) Patient / Family Education for ongoing care []  - 0 Staff obtains Programmer, systems, Records, Test Results / Process Orders []  - 0 Staff telephones HHA, Nursing Homes / Clarify orders / etc []  - 0 Routine Transfer to another Facility (non-emergent condition) []  - 0 Routine Hospital Admission (non-emergent condition) []  - 0 New Admissions / Biomedical engineer / Ordering NPWT, Apligraf, etc. []  - 0 Emergency Hospital Admission (emergent condition) PROCESS - Special Needs []  - Pediatric / Minor Patient Management 0 []  - 0 Isolation Patient Management []  - 0 Hearing / Language / Visual special needs []  - 0 Assessment of Community assistance (transportation, D/C planning, etc.) []  - 0 Additional assistance / Altered mentation []  - 0 Support Surface(s) Assessment (bed, cushion, seat, etc.) INTERVENTIONS - Miscellaneous []  - External ear exam 0 []  - 0 Patient Transfer (multiple staff / Civil Service fast streamer / Similar devices) []  - 0 Simple Staple / Suture removal (25 or less) []  - 0 Complex Staple / Suture removal (26 or more) []  - 0 Hypo/Hyperglycemic Management (do not check if billed separately) []  - 0 Ankle / Brachial Index (ABI) - do not check if billed separately Has the patient been seen at the hospital within the last three years: Yes Total Score: 0 Level Of Care: ____ [name] ([id number]) Electronic Signature(s) Signed: 12/13/2020 1:16:47 PM By: Donnamarie Poag Signed: 12/13/2020 4:40:35 PM By: Dolan Amen RN Entered By: Dolan Amen on 12/13/2020 09:57:35 Kamphuis, Jonaya M.  ([id number]) -------------------------------------------------------------------------------- Lower Extremity Assessment Details Patient Name: Heather Peterson. Date of Service: 12/13/2020 9:00 AM Medical Record Number: [id number] Patient Account Number: [id number] Date of Birth/Sex: Dec 16, 1940 (80 y.o. F) Treating  RN: Dolan Amen Primary Care Avaya Mcjunkins: Thereasa Distance Other Clinician: Referring Christi Wirick: Thereasa Distance Treating Nick Armel/Extender: Yaakov Guthrie in Treatment: 3 Electronic Signature(s) Signed: 12/13/2020 1:16:47 PM By: Donnamarie Poag Signed: 12/13/2020 4:40:35 PM By: Dolan Amen RN Entered By: Dolan Amen on 12/13/2020 09:26:36 WYLENE, WEISSMAN ([id number]) -------------------------------------------------------------------------------- Multi Wound Chart Details Patient Name: Heather Peterson. Date of Service: 12/13/2020 9:00 AM Medical Record Number: [id number] Patient Account Number: [id number] Date of Birth/Sex: 1940/08/17 (80 y.o. F) Treating RN: Dolan Amen Primary Care Trevonte Ashkar: Thereasa Distance Other Clinician: Referring Quirino Kakos: Thereasa Distance Treating Brindy Higginbotham/Extender: Yaakov Guthrie in Treatment: 3 Vital Signs Height(in): 63 Pulse(bpm): 86 Weight(lbs): 156 Blood Pressure(mmHg): 100/63 Body Mass Index(BMI): 28 Temperature(F): 97.6 Respiratory Rate(breaths/min): 18 Photos: [N/A:N/A] Wound Location: Right Hand - Palm N/A N/A Wounding Event: Skin Tear/Laceration N/A N/A Primary Etiology: Dehisced Wound N/A N/A Comorbid History: Cataracts, Anemia, Chronic N/A N/A Obstructive Pulmonary Disease (COPD), Congestive Heart Failure, Hypertension, Myocardial Infarction, Type II Diabetes, End Stage Renal Disease, Gout, Osteoarthritis, Neuropathy Date Acquired: 11/08/2020 N/A N/A Weeks of Treatment: 3 N/A N/A Wound Status: Open N/A N/A Measurements L x W x D (cm) 1x0.6x0.3 N/A N/A Area (cm) : 0.471 N/A N/A Volume (cm) :  0.141 N/A N/A % Reduction in Area: 50.00% N/A N/A % Reduction in Volume: 50.20% N/A N/A Classification: Full Thickness Without Exposed N/A N/A Support Structures Exudate Amount: Medium N/A N/A Exudate Type: Serosanguineous N/A N/A Exudate Color: red, brown N/A N/A Granulation Amount: Medium (34-66%) N/A N/A Granulation Quality: Pink N/A N/A Necrotic Amount: Medium (34-66%) N/A N/A Exposed Structures: Fat Layer (Subcutaneous Tissue): N/A N/A Yes Fascia: No Tendon: No Muscle: No Joint: No Bone: No Epithelialization: Small (1-33%) N/A N/A Debridement: Chemical/Enzymatic/Mechanical N/A N/A Pre-procedure Verification/Time 09:56 N/A N/A Out Taken: Instrument: Other(cotton tipped applicator) N/A N/A Bleeding: None N/A N/A Debridement Treatment Procedure was tolerated well N/A N/A Response: Post Debridement 1x0.6x0.3 N/A N/A Measurements L x W x D (cm) Clayson, Jawanna M. ([id number]) Post Debridement Volume: 0.141 N/A N/A (cm) Procedures Performed: Debridement N/A N/A Treatment Notes Electronic Signature(s) Signed: 12/13/2020 10:19:18 AM By: Kalman Shan DO Entered By: Kalman Shan on 12/13/2020 10:15:26 [name] ([id number]) -------------------------------------------------------------------------------- Multi-Disciplinary Care Plan Details Patient Name: TARI, LECOUNT. Date of Service: 12/13/2020 9:00 AM Medical Record Number: [id number] Patient Account Number: [id number] Date of Birth/Sex: [birth year] (80 y.o. F) Treating RN: Dolan Amen Primary Care Kesean Serviss: Thereasa Distance Other Clinician: Referring Kohler Pellerito: Thereasa Distance Treating Shaqueta Casady/Extender: Yaakov Guthrie in Treatment: 3 Active Inactive Necrotic Tissue Nursing Diagnoses: Impaired tissue integrity related to necrotic/devitalized tissue Goals: Necrotic/devitalized tissue will be minimized in the wound bed Date Initiated: 11/22/2020 Target Resolution Date: 11/22/2020 Goal  Status: Active Patient/caregiver will verbalize understanding of reason and process for debridement of necrotic tissue Date Initiated: 11/22/2020 Target Resolution Date: 11/22/2020 Goal Status: Active Interventions: Assess patient pain level pre-, during and post procedure and prior to discharge Provide education on necrotic tissue and debridement process Treatment Activities: Apply topical anesthetic as ordered : 11/22/2020 Biologic debridement : 11/22/2020 Enzymatic debridement : 11/22/2020 Excisional debridement : 11/22/2020 Notes: Wound/Skin Impairment Nursing Diagnoses: Impaired tissue integrity Goals: Patient/caregiver will verbalize understanding of skin care regimen Date Initiated: 11/22/2020 Target Resolution Date: 11/22/2020 Goal Status: Active Ulcer/skin breakdown will have a volume reduction of 30% by week 4 Date Initiated: 11/22/2020 Target Resolution Date: 12/23/2020 Goal Status: Active Ulcer/skin breakdown will have a volume reduction of 50% by week 8 Date Initiated: 11/22/2020 Target Resolution Date: 01/23/2021 Goal Status: Active  Ulcer/skin breakdown will have a volume reduction of 80% by week 12 Date Initiated: 11/22/2020 Target Resolution Date: 02/22/2021 Goal Status: Active Ulcer/skin breakdown will heal within 14 weeks Date Initiated: 11/22/2020 Target Resolution Date: 03/25/2021 Goal Status: Active Interventions: Assess patient/caregiver ability to obtain necessary supplies Assess patient/caregiver ability to perform ulcer/skin care regimen upon admission and as needed Assess ulceration(s) every visit Provide education on ulcer and skin care EH, SAUSEDA ([id number]) Treatment Activities: Referred to DME Rakiyah Esch for dressing supplies : 11/22/2020 Skin care regimen initiated : 11/22/2020 Notes: Electronic Signature(s) Signed: 12/13/2020 1:16:47 PM By: Donnamarie Poag Signed: 12/13/2020 4:40:35 PM By: Dolan Amen RN Entered By: Dolan Amen on 12/13/2020  09:56:07 Wertenberger, Arletha M. ([id number]) -------------------------------------------------------------------------------- Pain Assessment Details Patient Name: ANAY, WALTER. Date of Service: 12/13/2020 9:00 AM Medical Record Number: [id number] Patient Account Number: [id number] Date of Birth/Sex: March 01, 1941 (80 y.o. F) Treating RN: Dolan Amen Primary Care Daleisa Halperin: Thereasa Distance Other Clinician: Referring Advay Volante: Thereasa Distance Treating Kaeleigh Westendorf/Extender: Yaakov Guthrie in Treatment: 3 Active Problems Location of Pain Severity and Description of Pain Patient Has Paino No Site Locations Rate the pain. Current Pain Level: 0 Pain Management and Medication Current Pain Management: Electronic Signature(s) Signed: 12/13/2020 1:16:47 PM By: Donnamarie Poag Signed: 12/13/2020 4:40:35 PM By: Dolan Amen RN Entered By: Dolan Amen on 12/13/2020 09:20:07 DANDREA, MEDDERS ([id number]) -------------------------------------------------------------------------------- Patient/Caregiver Education Details Patient Name: RAIA, AMICO. Date of Service: 12/13/2020 9:00 AM Medical Record Number: [id number] Patient Account Number: [id number] Date of Birth/Gender: June 10, 1940 (80 y.o. F) Treating RN: Dolan Amen Primary Care Physician: Thereasa Distance Other Clinician: Referring Physician: Thereasa Distance Treating Physician/Extender: Yaakov Guthrie in Treatment: 3 Education Assessment Education Provided To: Patient Education Topics Provided Wound/Skin Impairment: Methods: Explain/Verbal Responses: State content correctly Electronic Signature(s) Signed: 12/13/2020 1:16:47 PM By: Donnamarie Poag Signed: 12/13/2020 4:40:35 PM By: Dolan Amen RN Entered By: Dolan Amen on 12/13/2020 10:05:43 [name] ([id number]) -------------------------------------------------------------------------------- Wound Assessment Details Patient Name: CHONG, JANUARY. Date of Service: 12/13/2020 9:00 AM Medical Record Number: [id number] Patient Account Number: [id number] Date of Birth/Sex: [birth year] (80 y.o. F) Treating RN: Dolan Amen Primary Care Viviene Thurston: Thereasa Distance Other Clinician: Referring Ingeborg Fite: Thereasa Distance Treating Arlin Sass/Extender: Yaakov Guthrie in Treatment: 3 Wound Status Wound Number: 1 Primary Dehisced Wound Etiology: Wound Location: Right Hand - Palm Wound Open Wounding Event: Skin Tear/Laceration Status: Date Acquired: 11/08/2020 Comorbid Cataracts, Anemia, Chronic Obstructive Pulmonary Disease Weeks Of Treatment: 3 History: (COPD), Congestive Heart Failure, Hypertension, Clustered Wound: No Myocardial Infarction, Type II Diabetes, End Stage Renal Disease, Gout, Osteoarthritis, Neuropathy Photos Wound Measurements Length: (cm) 1 Width: (cm) 0.6 Depth: (cm) 0.3 Area: (cm) 0.471 Volume: (cm) 0.141 % Reduction in Area: 50% % Reduction in Volume: 50.2% Epithelialization: Small (1-33%) Tunneling: No Undermining: No Wound Description Classification: Full Thickness Without Exposed Support Structu Exudate Amount: Medium Exudate Type: Serosanguineous Exudate Color: red, brown res Foul Odor After Cleansing: No Slough/Fibrino Yes Wound Bed Granulation Amount: Medium (34-66%) Exposed Structure Granulation Quality: Pink Fascia Exposed: No Necrotic Amount: Medium (34-66%) Fat Layer (Subcutaneous Tissue) Exposed: Yes Necrotic Quality: Adherent Slough Tendon Exposed: No Muscle Exposed: No Joint Exposed: No Bone Exposed: No Electronic Signature(s) Signed: 12/13/2020 1:16:47 PM By: Donnamarie Poag Signed: 12/13/2020 4:40:35 PM By: Dolan Amen RN Entered By: Dolan Amen on 12/13/2020 09:25:26 Christian, Berlene M. ([id number]) -------------------------------------------------------------------------------- Vitals Details Patient Name: [name]. Date of Service: 12/13/2020 9:00  AM Medical Record Number: [id number] Patient  Account Number: [id number] Date of Birth/Sex: May 15, 1940 (80 y.o. F) Treating RN: Dolan Amen Primary Care Ndea Kilroy: Thereasa Distance Other Clinician: Referring Crystel Demarco: Thereasa Distance Treating Naheim Burgen/Extender: Yaakov Guthrie in Treatment: 3 Vital Signs Time Taken: 09:18 Temperature (F): 97.6 Height (in): 63 Pulse (bpm): 86 Weight (lbs): 156 Respiratory Rate (breaths/min): 18 Body Mass Index (BMI): 27.6 Blood Pressure (mmHg): 100/63 Reference Range: 80 - 120 mg / dl Electronic Signature(s) Signed: 12/13/2020 1:16:47 PM By: Donnamarie Poag Signed: 12/13/2020 4:40:35 PM By: Dolan Amen RN Entered By: Dolan Amen on 12/13/2020 09:19:57

## 2020-12-14 LAB — PROTEIN ELECTROPHORESIS, SERUM
A/G Ratio: 1.3 (ref 0.7–1.7)
Albumin ELP: 3.6 g/dL (ref 2.9–4.4)
Alpha-1-Globulin: 0.2 g/dL (ref 0.0–0.4)
Alpha-2-Globulin: 0.9 g/dL (ref 0.4–1.0)
Beta Globulin: 1.3 g/dL (ref 0.7–1.3)
Gamma Globulin: 0.3 g/dL — ABNORMAL LOW (ref 0.4–1.8)
Globulin, Total: 2.7 g/dL (ref 2.2–3.9)
M-Spike, %: 0.4 g/dL — ABNORMAL HIGH
Total Protein ELP: 6.3 g/dL (ref 6.0–8.5)

## 2020-12-15 ENCOUNTER — Inpatient Hospital Stay: Payer: Medicare Other

## 2020-12-15 ENCOUNTER — Other Ambulatory Visit: Payer: Self-pay

## 2020-12-15 ENCOUNTER — Emergency Department: Payer: Medicare Other

## 2020-12-15 ENCOUNTER — Encounter: Payer: Self-pay | Admitting: Intensive Care

## 2020-12-15 ENCOUNTER — Emergency Department
Admission: EM | Admit: 2020-12-15 | Discharge: 2020-12-15 | Disposition: A | Discharge status: Home / Self Care | Payer: Medicare Other | Attending: Emergency Medicine | Admitting: Emergency Medicine

## 2020-12-15 ENCOUNTER — Telehealth: Payer: Self-pay | Admitting: Oncology

## 2020-12-15 DIAGNOSIS — W19XXXA Unspecified fall, initial encounter: Secondary | ICD-10-CM

## 2020-12-15 DIAGNOSIS — Z7982 Long term (current) use of aspirin: Secondary | ICD-10-CM | POA: Insufficient documentation

## 2020-12-15 DIAGNOSIS — S6991XA Unspecified injury of right wrist, hand and finger(s), initial encounter: Secondary | ICD-10-CM | POA: Diagnosis present

## 2020-12-15 DIAGNOSIS — S0990XA Unspecified injury of head, initial encounter: Secondary | ICD-10-CM | POA: Insufficient documentation

## 2020-12-15 DIAGNOSIS — S61411A Laceration without foreign body of right hand, initial encounter: Secondary | ICD-10-CM | POA: Insufficient documentation

## 2020-12-15 DIAGNOSIS — Y92009 Unspecified place in unspecified non-institutional (private) residence as the place of occurrence of the external cause: Secondary | ICD-10-CM

## 2020-12-15 DIAGNOSIS — Y92000 Kitchen of unspecified non-institutional (private) residence as  the place of occurrence of the external cause: Secondary | ICD-10-CM | POA: Insufficient documentation

## 2020-12-15 DIAGNOSIS — S81011A Laceration without foreign body, right knee, initial encounter: Secondary | ICD-10-CM | POA: Diagnosis not present

## 2020-12-15 DIAGNOSIS — W01198A Fall on same level from slipping, tripping and stumbling with subsequent striking against other object, initial encounter: Secondary | ICD-10-CM | POA: Diagnosis not present

## 2020-12-15 DIAGNOSIS — T148XXA Other injury of unspecified body region, initial encounter: Secondary | ICD-10-CM

## 2020-12-15 DIAGNOSIS — I5042 Chronic combined systolic (congestive) and diastolic (congestive) heart failure: Secondary | ICD-10-CM | POA: Insufficient documentation

## 2020-12-15 DIAGNOSIS — S61412A Laceration without foreign body of left hand, initial encounter: Secondary | ICD-10-CM | POA: Insufficient documentation

## 2020-12-15 DIAGNOSIS — Z95810 Presence of automatic (implantable) cardiac defibrillator: Secondary | ICD-10-CM | POA: Diagnosis not present

## 2020-12-15 DIAGNOSIS — S81012A Laceration without foreign body, left knee, initial encounter: Secondary | ICD-10-CM | POA: Insufficient documentation

## 2020-12-15 DIAGNOSIS — F17210 Nicotine dependence, cigarettes, uncomplicated: Secondary | ICD-10-CM | POA: Diagnosis not present

## 2020-12-15 DIAGNOSIS — I2510 Atherosclerotic heart disease of native coronary artery without angina pectoris: Secondary | ICD-10-CM | POA: Diagnosis not present

## 2020-12-15 DIAGNOSIS — Z96641 Presence of right artificial hip joint: Secondary | ICD-10-CM | POA: Diagnosis not present

## 2020-12-15 DIAGNOSIS — E1122 Type 2 diabetes mellitus with diabetic chronic kidney disease: Secondary | ICD-10-CM | POA: Insufficient documentation

## 2020-12-15 DIAGNOSIS — Z85830 Personal history of malignant neoplasm of bone: Secondary | ICD-10-CM | POA: Insufficient documentation

## 2020-12-15 DIAGNOSIS — I130 Hypertensive heart and chronic kidney disease with heart failure and stage 1 through stage 4 chronic kidney disease, or unspecified chronic kidney disease: Secondary | ICD-10-CM | POA: Insufficient documentation

## 2020-12-15 DIAGNOSIS — Z955 Presence of coronary angioplasty implant and graft: Secondary | ICD-10-CM | POA: Insufficient documentation

## 2020-12-15 DIAGNOSIS — N1832 Chronic kidney disease, stage 3b: Secondary | ICD-10-CM | POA: Insufficient documentation

## 2020-12-15 NOTE — ED Triage Notes (Signed)
Patient arrived POV from home after mechanical fall in kitchen today. Reports hitting head on kitchen cabinets.hematoma noted to head. Takes aspirin daily. Multiple skin tears (left hand, right knee) on body from fall. A&O x4 in triage. Hx multiple myeloma

## 2020-12-15 NOTE — Discharge Instructions (Addendum)
Your exam and CT scans are normal following your fall. You should keep the wounds clean and covered. Follow-up with your provider as needed.

## 2020-12-15 NOTE — Progress Notes (Signed)
Pt presents to cancer center this afternoon for her scheduled treatment.  She reports that she is not having a very good day because she fell this morning.  She reports that she "hit everything" when she fell.  When asked for more details regarding her fall, she reports that she was trying to put on her slipper when she fell.  She reports hitting the left side of her head and the back of her head as well as her shoulder and "every other part of her body as well".  Pt also with hypotension today with BP 87/48 and 82/50.  Dr. Tasia Catchings to chairside for assessment of patient.  Pt initially refused to go to the ER as recommended but eventually agreed.    Per Dr. Grayland Ormond, treatment held today and pt sent to ER for evaluation.  Pt left chemo suite in a wheelchair escorted by Duwayne Heck CMA and this RN to the ER.

## 2020-12-15 NOTE — Telephone Encounter (Signed)
I am covering Dr. Cecille Aver to evaluate this patient for infusion RN request.  Patient reports not feeling well today, had a fall and hit her head this morning.  Currently she denies any headache, nausea vomiting.  Her blood pressure is always low, due to CHF, on midodrine.  Physical examination did not reveal any focal deficit, lungs are clear.  Bilateral lower extremity +2 edema.I recommend patient to go to emergency room for further evaluation, rule out intracranial bleeding.  She adamantly refuses.  Will obtain CT head without contrast stat.  Advised patient to go to emergency room if her symptoms get worse.  I updated Dr. Cecille Aver who also agrees with the plan.

## 2020-12-15 NOTE — ED Provider Notes (Signed)
[facility] Emergency Department Provider Note ____________________________________________  Time seen: 1836  I have reviewed the triage vital signs and the nursing notes.  HISTORY  Chief Complaint  Fall  HPI Heather Peterson is a 80 y.o. female with below medical history, presents to the ED for evaluation of a mechanical fall.  Patient was at home, and EKG, which she describes mechanical fall resulting in her hitting her head on the cabinet.  She describes a hematoma develop.  She was at the cancer center this morning for routine evaluation, when a asked that she come to the ED for further evaluation of her injuries.  She denies any loss of consciousness, nausea, vomiting, or dizziness related to the fall.  Patient does take a daily aspirin but denies any other blood thinner use.  She also endorses skin tears to the hands and knees, both have been cleansed and dressed by her daughter.  She denies any current complaints of pain, nausea vomiting at the time of this evaluation.  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[place])    Prolonged Q-T interval on ECG    Thyroid disease     Patient Active Problem List   Diagnosis Date Noted   Chemotherapy-induced peripheral neuropathy ([place]) 10/06/2020   Hospital discharge follow-up 08/31/2020   Difficulty sleeping 07/25/2020   Difficulty walking 07/25/2020   H/O subarachnoid hemorrhage 07/25/2020   Moderate protein-calorie malnutrition ([place]) 07/04/2020   Hyperkalemia 06/19/2020   Chronic respiratory failure ([place]) 06/19/2020   Hypokalemia    Fall    Troponin I above  reference range    Status post hip hemiarthroplasty    Closed nondisplaced fracture of greater trochanter of right femur ([place]) 03/30/2020   Closed displaced midcervical fracture of right femur ([place]) 03/30/2020   Hip fracture ([place]) 03/28/2020   Multiple myeloma ([place]) 03/01/2020   Goals of care, counseling/discussion 03/01/2020   Osteomyelitis ([place]) 01/20/2020   Gout 10/27/2019   Acute blood loss anemia    Gastrointestinal hemorrhage    UGI bleed 09/04/2019   Drug-induced constipation 07/26/2019   Insomnia due to medical condition 07/26/2019   Benign hypertensive kidney disease with chronic kidney disease 07/20/2019   Proteinuria 07/20/2019   Hyposmolality and/or hyponatremia 07/20/2019   Dehydration    Anemia in chronic kidney disease (CKD) 07/04/2019   Stage 3b chronic kidney disease ([place]) 07/04/2019   Chronic respiratory failure with hypoxia ([place]) 07/03/2019   Hyponatremia 07/03/2019   Prolonged Q-T interval on ECG    Weakness    Anemia    MDD (major depressive disorder), recurrent episode, 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   Tobacco use disorder 12/14/2018   At risk for prolonged QT interval syndrome 12/14/2018   Hypothyroidism, acquired, 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   Pedal edema 02/19/2018   SOB (shortness of breath) on exertion 02/19/2018   Left main coronary artery disease 03/07/2017   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[place]) 03/06/2017   Ventricular tachycardia ([place]) 03/05/2017   Defibrillator discharge 03/04/2017   CHF (congestive heart failure), NYHA class III, chronic, combined ([place]) 03/04/2017   COPD (chronic  obstructive pulmonary disease) ([place]) 02/10/2017   Diabetes mellitus type 2, uncomplicated ([place]) 62/69/4854   H/O ventricular fibrillation 02/10/2017   Hyperlipidemia 02/10/2017   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   Ischemic cardiomyopathy 02/10/2017   Myocardial infarction ([place]) 02/10/2017   Moderate mitral regurgitation 02/10/2017   Syncope  02/04/2017   CAD (coronary artery disease) 02/04/2017   HTN (hypertension) 02/04/2017   Type 2 diabetes mellitus with stage 3 chronic kidney disease ([place]) 02/04/2017    Past Surgical History:  Procedure Laterality Date   CENTRAL LINE INSERTION  03/11/2017   Procedure: CENTRAL LINE INSERTION;  Surgeon: Leonie Man, MD;  Location: [place] [facility];  Service: Cardiovascular;;   CHOLECYSTECTOMY     COLONOSCOPY WITH PROPOFOL N/A 09/01/2019   Procedure: COLONOSCOPY WITH PROPOFOL;  Surgeon: Toledo, Benay Pike, MD;  Location: [facility] ENDOSCOPY;  Service: Gastroenterology;  Laterality: N/A;   CORONARY STENT INTERVENTION W/IMPELLA N/A 03/11/2017   Procedure: Coronary Stent Intervention w/Impella;  Surgeon: Leonie Man, MD;  Location: [place] [facility];  Service: Cardiovascular;  Laterality: N/A;   ESOPHAGOGASTRODUODENOSCOPY (EGD) WITH PROPOFOL N/A 09/01/2019   Procedure: ESOPHAGOGASTRODUODENOSCOPY (EGD) WITH PROPOFOL;  Surgeon: Toledo, Benay Pike, MD;  Location: [facility] ENDOSCOPY;  Service: Gastroenterology;  Laterality: N/A;   ESOPHAGOGASTRODUODENOSCOPY (EGD) WITH PROPOFOL N/A 09/08/2019   Procedure: ESOPHAGOGASTRODUODENOSCOPY (EGD) WITH PROPOFOL;  Surgeon: [name], MD;  Location: [facility];  Service: Gastroenterology;  Laterality: N/A;   EYE SURGERY     HIP ARTHROPLASTY Right 03/29/2020   Procedure: ARTHROPLASTY BIPOLAR HIP (HEMIARTHROPLASTY);  Surgeon: Corky Mull, MD;  Location: [facility];  Service: Orthopedics;  Laterality: Right;   INTRAVASCULAR PRESSURE WIRE/FFR STUDY N/A 03/11/2017   Procedure: INTRAVASCULAR PRESSURE WIRE/FFR STUDY;  Surgeon: Leonie Man, MD;  Location: [place] [facility];  Service: Cardiovascular;  Laterality: N/A;   INTRAVASCULAR ULTRASOUND/IVUS N/A 03/11/2017   Procedure: Intravascular Ultrasound/IVUS;  Surgeon: Leonie Man, MD;  Location: [place] [facility];  Service: Cardiovascular;  Laterality: N/A;   LEFT HEART CATH AND CORONARY ANGIOGRAPHY N/A  03/05/2017   Procedure: LEFT HEART CATH AND CORONARY ANGIOGRAPHY;  Surgeon: Isaias Cowman, MD;  Location: [place] [facility];  Service: Cardiovascular;  Laterality: N/A;   PACEMAKER IMPLANT     pacemaker/defibrillator Left     Prior to Admission medications   Medication Sig Start Date End Date Taking? Authorizing Provider  allopurinol (ZYLOPRIM) 100 MG tablet Take 50 mg by mouth daily.    [provider]  aspirin EC 81 MG tablet Take 81 mg by mouth at bedtime.     [provider]  dexamethasone (DECADRON) 4 MG tablet Take 5 tablets (20 mg total) by mouth daily. Take the day after Velcade on days 2,5,9,12. Take with breakfast 11/14/20   Lloyd Huger, MD  docusate sodium (COLACE) 100 MG capsule Take 100 mg by mouth 2 (two) times daily.    [provider]  Ensure Max Protein (ENSURE MAX PROTEIN) LIQD Take 330 mLs (11 oz total) by mouth 2 (two) times daily between meals. 04/03/20   [name], MD  fexofenadine (ALLEGRA) 180 MG tablet Take 180 mg by mouth daily.    [provider]  FLUoxetine (PROZAC) 10 MG capsule Take 10 mg by mouth daily. 06/29/19   [provider]  gabapentin (NEURONTIN) 100 MG capsule Take 100 mg by mouth 2 (two) times daily. Patient takes 272m in the morning and 9065mat night.    [provider]  gabapentin (NEURONTIN)  300 MG capsule Take 31m in AM, 3025min afternoon, 90062mt night Patient taking differently: Patient takes 200m60m the morning and 900mg76mnight. 10/06/20   Vaslow, ZachaAcey Lav gabapentin (NEURONTIN) 600 MG tablet Take 600 mg by mouth daily. Patient takes 200mg 52mhe morning and 900mg a45mght. 11/07/20   [provider]  hydrocortisone (ANUSOL-HC) 2.5 % rectal cream Apply 1 application topically 2 (two) times daily. Patient not taking: No sig reported 02/12/20   [provider]  Lactulose 20 GM/30ML SOLN Take 30 mLs (20 g total) by mouth in the morning and at bedtime.  06/23/20   Burns, Jacquelin Hawkingevothyroxine (SYNTHROID) 50 MCG tablet Take by mouth. 08/14/20   [provider]  levothyroxine (SYNTHROID) 75 MCG tablet Take 1 tablet (75 mcg total) by mouth daily before breakfast. On Monday, Wednesday and Friday 04/03/20   Amin, SLorella Nimrodagnesium oxide (MAG-OX) 400 MG tablet Take 200 mg by mouth daily.    [provider]  mexiletine (MEXITIL) 200 MG capsule Take 1 capsule (200 mg total) by mouth every 12 (twelve) hours. 03/14/17   Seiler,Patsey Bertholdidodrine (PROAMATINE) 10 MG tablet Take 1 tablet (10 mg total) by mouth 2 (two) times daily with a meal. 04/03/20   Amin, SLorella NimrodXYGEN Inhale 2 L into the lungs continuous.    [provider]  pantoprazole (PROTONIX) 40 MG tablet Take 40 mg by mouth daily.  05/16/19   [provider]  polyethylene glycol (MIRALAX) 17 g packet Take 17 g by mouth daily. 06/18/20   GoodmanNance Pearrenatal Vit-Fe Fumarate-FA (PRENATAL MULTIVITAMIN) TABS tablet Take 1 tablet by mouth daily.    [provider]  simvastatin (ZOCOR) 40 MG tablet Take 20 mg by mouth at bedtime.     [provider]  torsemide (DEMADEX) 20 MG tablet Take 20 mg by mouth daily.    [provider]  traZODone (DESYREL) 50 MG tablet Take 50 mg by mouth at bedtime.    [provider]  zolpidem (AMBIEN) 5 MG tablet Take 1 tablet (5 mg total) by mouth at bedtime as needed for sleep. Patient not taking: No sig reported 11/22/20   Allen, Verlon Au Allergies Celebrex [celecoxib], Glipizide, Levaquin [levofloxacin in d5w], Levofloxacin, Lisinopril, Sulfa antibiotics, Metformin, and Penicillins  Family History  Problem Relation Age of Onset   Hypertension Father    Heart attack Father    Depression Sister    Depression Brother    Depression Brother     Social History Social History   Tobacco Use   Smoking status: Every Day    Packs/day: 0.25    Types: E-cigarettes,  Cigarettes   Smokeless tobacco: Never  Vaping Use   Vaping Use: Former  Substance Use Topics   Alcohol use: Not Currently    Comment: occasionally   Drug use: Yes    Comment: prescribed pain meds    Review of Systems  Constitutional: Negative for fever. Eyes: Negative for visual changes. ENT: Negative for sore throat. Cardiovascular: Negative for chest pain. Respiratory: Negative for shortness of breath. Gastrointestinal: Negative for abdominal pain, vomiting and diarrhea. Genitourinary: Negative for dysuria. Musculoskeletal: Negative for back pain. Skin: Negative for rash. Skin tears Neurological: Negative for headaches, focal weakness or numbness. ____________________________________________  PHYSICAL EXAM:  VITAL SIGNS: ED Triage Vitals  Enc Vitals Group     BP 12/15/20 1545 100/60  Pulse Rate 12/15/20 1545 80     Resp 12/15/20 1545 20     Temp 12/15/20 1545 97.9 F (36.6 C)     Temp Source 12/15/20 1545 Oral     SpO2 12/15/20 1545 100 %     Weight 12/15/20 1546 159 lb (72.1 kg)     Height 12/15/20 1546 5' 3.5" (1.613 m)     Head Circumference --      Peak Flow --      Pain Score 12/15/20 1546 1     Pain Loc --      Pain Edu? --      Excl. in [place]? --     Constitutional: Alert and oriented. Well appearing and in no distress. GCS  Head: Normocephalic and atraumatic. Eyes: Conjunctivae are normal. PERRL. Normal extraocular movements Ears: Canals clear. TMs intact bilaterally. Nose: No congestion/rhinorrhea/epistaxis. Mouth/Throat: Mucous membranes are moist. Neck: Supple. No thyromegaly. Hematological/Lymphatic/Immunological: No cervical lymphadenopathy. Cardiovascular: Normal rate, regular rhythm. Normal distal pulses. Respiratory: Normal respiratory effort. No wheezes/rales/rhonchi. Gastrointestinal: Soft and nontender. No distention. Musculoskeletal: Nontender with normal range of motion in all extremities.  Neurologic:  Normal gait without ataxia.  Normal speech and language. No gross focal neurologic deficits are appreciated. Skin:  Skin is warm, dry and intact. No rash noted. Psychiatric: Mood and affect are normal. Patient exhibits appropriate insight and judgment. ____________________________________________    LABS (pertinent positives/negatives) Labs Reviewed - No data to display  ____________________________________________  {EKG  ____________________________________________   RADIOLOGY  Official radiology report(s): CT HEAD WO CONTRAST (5MM)  Result Date: 12/15/2020 CLINICAL DATA:  Head trauma, minor (Age >= 65y); Neck trauma (Age >= 65y) EXAM: CT HEAD WITHOUT CONTRAST CT CERVICAL SPINE WITHOUT CONTRAST TECHNIQUE: Multidetector CT imaging of the head and cervical spine was performed following the standard protocol without intravenous contrast. Multiplanar CT image reconstructions of the cervical spine were also generated. COMPARISON:  CT cervical spine November 16 21. CT head June 25, 2020. FINDINGS: CT HEAD FINDINGS Brain: No evidence of acute large vascular territory infarction, hemorrhage, hydrocephalus, extra-axial collection or mass lesion/mass effect. Remote right frontal infarct with encephalomalacia. Patchy white matter hypoattenuation, compatible with chronic microvascular ischemic disease. Mild atrophy with ex vacuo ventricular dilation. Partially empty sella. Vascular: No hyperdense vessel identified. Calcific atherosclerosis. Skull: Left frontal scalp contusion no acute fracture Sinuses/Orbits: Visualized sinuses are clear. Other: No mastoid effusions. CT CERVICAL SPINE FINDINGS Alignment: Similar mild reversal of the normal cervical lordosis. Similar mild anterolisthesis of C3 on C4 and C4 on C5, likely degenerative given stability and facet arthropathy at these levels. Skull base and vertebrae: No evidence of acute fracture. Osteopenia. Soft tissues and spinal canal: No prevertebral fluid or swelling. No visible  canal hematoma. Disc levels: Mild multilevel degenerative disc disease. At least moderate multilevel facet arthropathy, greatest in the upper cervical spine. Upper chest: Visualized lung apices are clear. Other: Calcific atherosclerosis of the carotids with retropharyngeal course. IMPRESSION: CT head: 1. No evidence of acute intracranial abnormality. 2. Left frontal scalp contusion without acute fracture. 3. Remote right frontal infarct with encephalomalacia. 4. Chronic microvascular ischemic disease. CT cervical spine: 1. No evidence of acute fracture or traumatic malalignment 2. Similar degenerative change and subluxation. Electronically Signed   By: Margaretha Sheffield MD   On: 12/15/2020 16:52   CT Cervical Spine Wo Contrast  Result Date: 12/15/2020 CLINICAL DATA:  Head trauma, minor (Age >= 65y); Neck trauma (Age >= 65y) EXAM: CT HEAD WITHOUT CONTRAST CT CERVICAL SPINE WITHOUT CONTRAST  TECHNIQUE: Multidetector CT imaging of the head and cervical spine was performed following the standard protocol without intravenous contrast. Multiplanar CT image reconstructions of the cervical spine were also generated. COMPARISON:  CT cervical spine November 16 21. CT head June 25, 2020. FINDINGS: CT HEAD FINDINGS Brain: No evidence of acute large vascular territory infarction, hemorrhage, hydrocephalus, extra-axial collection or mass lesion/mass effect. Remote right frontal infarct with encephalomalacia. Patchy white matter hypoattenuation, compatible with chronic microvascular ischemic disease. Mild atrophy with ex vacuo ventricular dilation. Partially empty sella. Vascular: No hyperdense vessel identified. Calcific atherosclerosis. Skull: Left frontal scalp contusion no acute fracture Sinuses/Orbits: Visualized sinuses are clear. Other: No mastoid effusions. CT CERVICAL SPINE FINDINGS Alignment: Similar mild reversal of the normal cervical lordosis. Similar mild anterolisthesis of C3 on C4 and C4 on C5, likely  degenerative given stability and facet arthropathy at these levels. Skull base and vertebrae: No evidence of acute fracture. Osteopenia. Soft tissues and spinal canal: No prevertebral fluid or swelling. No visible canal hematoma. Disc levels: Mild multilevel degenerative disc disease. At least moderate multilevel facet arthropathy, greatest in the upper cervical spine. Upper chest: Visualized lung apices are clear. Other: Calcific atherosclerosis of the carotids with retropharyngeal course. IMPRESSION: CT head: 1. No evidence of acute intracranial abnormality. 2. Left frontal scalp contusion without acute fracture. 3. Remote right frontal infarct with encephalomalacia. 4. Chronic microvascular ischemic disease. CT cervical spine: 1. No evidence of acute fracture or traumatic malalignment 2. Similar degenerative change and subluxation. Electronically Signed   By: Margaretha Sheffield MD   On: 12/15/2020 16:52   ____________________________________________  PROCEDURES   Procedures ____________________________________________   INITIAL IMPRESSION / ASSESSMENT AND PLAN / ED COURSE  As part of my medical decision making, I reviewed the following data within the [place] reviewed WNL and Notes from prior ED visits     DDX: minor head injury, SDH, scalp hematoma   Patient ED evaluation of injury sustained following mechanical fall.  Patient exam is overall nonreturn at this time.  CT images of the head and neck did not reveal any acute intracranial process or cervical spinous injury.  Patient denies any complaints of pain at this time.  She is discharged at this time stable condition will follow with primary provider.   Heather Peterson was evaluated in Emergency Department on 12/15/2020 for the symptoms described in the history of present illness. She was evaluated in the context of the global COVID-19 pandemic, which necessitated consideration that the patient might be at  risk for infection with the SARS-CoV-2 virus that causes COVID-19. Institutional protocols and algorithms that pertain to the evaluation of patients at risk for COVID-19 are in a state of rapid change based on information released by regulatory bodies including the CDC and federal and state organizations. These policies and algorithms were followed during the patient's care in the ED. ____________________________________________  FINAL CLINICAL IMPRESSION(S) / ED DIAGNOSES  Final diagnoses:  Fall in home, initial encounter  Minor head injury, initial encounter  Multiple skin tears      Carmie End, Dannielle Karvonen, PA-C 12/15/20 1924    Delman Kitten, MD 12/15/20 2120

## 2020-12-20 ENCOUNTER — Inpatient Hospital Stay: Payer: Medicare Other

## 2020-12-20 ENCOUNTER — Inpatient Hospital Stay (HOSPITAL_BASED_OUTPATIENT_CLINIC_OR_DEPARTMENT_OTHER): Payer: Medicare Other | Admitting: Oncology

## 2020-12-20 ENCOUNTER — Telehealth: Payer: Self-pay | Admitting: *Deleted

## 2020-12-20 ENCOUNTER — Other Ambulatory Visit: Payer: Self-pay

## 2020-12-20 VITALS — BP 96/59 | HR 84 | Temp 98.4°F | Resp 17

## 2020-12-20 DIAGNOSIS — C9000 Multiple myeloma not having achieved remission: Secondary | ICD-10-CM

## 2020-12-20 DIAGNOSIS — Z5111 Encounter for antineoplastic chemotherapy: Secondary | ICD-10-CM | POA: Diagnosis not present

## 2020-12-20 DIAGNOSIS — R197 Diarrhea, unspecified: Secondary | ICD-10-CM | POA: Diagnosis not present

## 2020-12-20 DIAGNOSIS — D649 Anemia, unspecified: Secondary | ICD-10-CM | POA: Diagnosis not present

## 2020-12-20 LAB — CBC WITH DIFFERENTIAL/PLATELET
Abs Immature Granulocytes: 0.74 10*3/uL — ABNORMAL HIGH (ref 0.00–0.07)
Basophils Absolute: 0 10*3/uL (ref 0.0–0.1)
Basophils Relative: 0 %
Eosinophils Absolute: 0 10*3/uL (ref 0.0–0.5)
Eosinophils Relative: 0 %
HCT: 25.5 % — ABNORMAL LOW (ref 36.0–46.0)
Hemoglobin: 7.6 g/dL — ABNORMAL LOW (ref 12.0–15.0)
Immature Granulocytes: 5 %
Lymphocytes Relative: 12 %
Lymphs Abs: 1.7 10*3/uL (ref 0.7–4.0)
MCH: 26.7 pg (ref 26.0–34.0)
MCHC: 29.8 g/dL — ABNORMAL LOW (ref 30.0–36.0)
MCV: 89.5 fL (ref 80.0–100.0)
Monocytes Absolute: 1.5 10*3/uL — ABNORMAL HIGH (ref 0.1–1.0)
Monocytes Relative: 10 %
Neutro Abs: 10.6 10*3/uL — ABNORMAL HIGH (ref 1.7–7.7)
Neutrophils Relative %: 73 %
Platelets: 271 10*3/uL (ref 150–400)
RBC: 2.85 MIL/uL — ABNORMAL LOW (ref 3.87–5.11)
RDW: 18.8 % — ABNORMAL HIGH (ref 11.5–15.5)
WBC: 14.6 10*3/uL — ABNORMAL HIGH (ref 4.0–10.5)
nRBC: 0.5 % — ABNORMAL HIGH (ref 0.0–0.2)

## 2020-12-20 LAB — COMPREHENSIVE METABOLIC PANEL
ALT: 14 U/L (ref 0–44)
AST: 15 U/L (ref 15–41)
Albumin: 3.7 g/dL (ref 3.5–5.0)
Alkaline Phosphatase: 83 U/L (ref 38–126)
Anion gap: 10 (ref 5–15)
BUN: 44 mg/dL — ABNORMAL HIGH (ref 8–23)
CO2: 27 mmol/L (ref 22–32)
Calcium: 8.3 mg/dL — ABNORMAL LOW (ref 8.9–10.3)
Chloride: 95 mmol/L — ABNORMAL LOW (ref 98–111)
Creatinine, Ser: 1.55 mg/dL — ABNORMAL HIGH (ref 0.44–1.00)
GFR, Estimated: 34 mL/min — ABNORMAL LOW (ref >60)
Glucose, Bld: 121 mg/dL — ABNORMAL HIGH (ref 70–99)
Potassium: 4.5 mmol/L (ref 3.5–5.1)
Sodium: 132 mmol/L — ABNORMAL LOW (ref 135–145)
Total Bilirubin: 0.7 mg/dL (ref 0.3–1.2)
Total Protein: 6.3 g/dL — ABNORMAL LOW (ref 6.5–8.1)

## 2020-12-20 NOTE — Progress Notes (Signed)
Symptom Management Consult note [facility]  [phone] [phone]  [name], MD as PCP - General (Family Medicine) Lloyd Huger, MD as Consulting Physician (Oncology)   Name of the patient: Heather Peterson  [id number]  May 28, 1940   Date of visit: 12/20/2020   Diagnosis-multiple myeloma  Chief complaint/ Reason for visit- Low BP and unsteady on her feet   Heme/Onc history:  Multiple myeloma: Bone marrow biopsy confirmed diagnosis with plasma cells up to 90% of biopsy.  Cytogenetics are reported as normal.  Metastatic bone survey from March 03, 2020 reviewed independently with multiple skeletal lucencies consistent with myeloma. Her M spike was initially 1.2 and initially trended down to 0.1, but more recently has trended up to 0.7 IgA levels initially were 2773, trended down to normal, but now increased to 1197. Kappa and lambda free light chains continue to be within normal limits. Initial plan was to give Velcade on days 1, 4, 8, and 11 along with 10 mg Revlimid on days 1 through 14. Patient will also receive weekly 20 mg dexamethasone.  Revlimid was discontinued secondary to intolerance.  Patient did not receive treatment between June 20, 2020 and November 14, 2020.   Interval history-  Heather Peterson is a 80 year old female who presents to clinic today for concerns of low blood pressure readings and dizziness.  She was evaluated in the emergency room on 12/15/2020 for mechanical fall due to loss of balance and developed a hematoma on her head.  CT images of the head and neck did not reveal any acute intracranial process or cervical spine injury and she was discharged home.  She is followed by Dr. Grayland Ormond and receives single agent Velcade.  She was unable to tolerate Revlimid.  She was last in clinic about 8 days ago.  Her hemoglobin has started to trend down since reinitiating Velcade in early July and has  been as low as 6.5 requiring PRBC last on 12/05/20.   Today she presents with her daughter.  She feels weak and tired all the time.  Requires help with most of her ADL's d/t unsteadiness.  Has been checking her blood pressure at home and it has been systolically less than 194 for quite some time now.  She has been taking her torsemide daily for the last couple days due to lower extremity tightness and swelling which is causing more unsteadiness.  She is not on any other blood pressure medicines.  She is taking midodrine 5 mg 3 times a day but have not been successful and bringing her blood pressure.  Reports foul-smelling diarrhea for the past few days as well.  She is eating and drinking well.  Does not feel she is dehydrated.   ECOG FS:2 - Symptomatic, <50% confined to bed  Review of systems- Review of Systems  Constitutional:  Positive for malaise/fatigue. Negative for chills, fever and weight loss.  HENT:  Negative for congestion, ear pain and tinnitus.   Eyes: Negative.  Negative for blurred vision and double vision.  Respiratory: Negative.  Negative for cough, sputum production and shortness of breath.   Cardiovascular:  Positive for leg swelling. Negative for chest pain and palpitations.  Gastrointestinal:  Positive for diarrhea. Negative for abdominal pain, constipation, nausea and vomiting.  Genitourinary:  Negative for dysuria, frequency and urgency.  Musculoskeletal:  Positive for falls and myalgias. Negative for back pain.  Skin: Negative.  Negative for rash.  Neurological:  Positive for  dizziness and weakness. Negative for headaches.  Endo/Heme/Allergies: Negative.  Does not bruise/bleed easily.  Psychiatric/Behavioral: Negative.  Negative for depression. The patient is not nervous/anxious and does not have insomnia.     Current treatment-single agent Velcade  Allergies  Allergen Reactions   Celebrex [Celecoxib] Anaphylaxis   Glipizide Anaphylaxis   Levaquin [Levofloxacin In  D5w] Other (See Comments)    Heart arrhthymias   Levofloxacin Other (See Comments) and Palpitations    ICD fired   Lisinopril Swelling    Lip and facial swelling   Sulfa Antibiotics Other (See Comments) and Anaphylaxis    Reaction: unknown   Metformin Other (See Comments)    Gi tolerance    Penicillins Rash and Other (See Comments)    Has patient had a PCN reaction causing immediate rash, facial/tongue/throat swelling, SOB or lightheadedness with hypotension: Unknown Has patient had a PCN reaction causing severe rash involving mucus membranes or skin necrosis: No Has patient had a PCN reaction that required hospitalization: No Has patient had a PCN reaction occurring within the last 10 years: No If all of the above answers are "NO", then may proceed with Cephalosporin use.      Past Medical History:  Diagnosis Date   Anxiety    Chronic combined systolic and diastolic CHF (congestive heart failure) (HCC)    Chronic kidney disease    Coronary artery disease    Depression    Diabetes mellitus without complication (HCC)    Diabetes mellitus, type II ([place])    Hypertension    MI (myocardial infarction) ([place])    x 5   Pacemaker    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HCC)    Prolonged Q-T interval on ECG    Thyroid disease      Past Surgical History:  Procedure Laterality Date   CENTRAL LINE INSERTION  03/11/2017   Procedure: CENTRAL LINE INSERTION;  Surgeon: Leonie Man, MD;  Location: [place] [facility];  Service: Cardiovascular;;   CHOLECYSTECTOMY     COLONOSCOPY WITH PROPOFOL N/A 09/01/2019   Procedure: COLONOSCOPY WITH PROPOFOL;  Surgeon: Toledo, Benay Pike, MD;  Location: [facility];  Service: Gastroenterology;  Laterality: N/A;   CORONARY STENT INTERVENTION W/IMPELLA N/A 03/11/2017   Procedure: Coronary Stent Intervention w/Impella;  Surgeon: Leonie Man, MD;  Location: [place] [facility];  Service: Cardiovascular;  Laterality: N/A;   ESOPHAGOGASTRODUODENOSCOPY  (EGD) WITH PROPOFOL N/A 09/01/2019   Procedure: ESOPHAGOGASTRODUODENOSCOPY (EGD) WITH PROPOFOL;  Surgeon: Toledo, Benay Pike, MD;  Location: [facility];  Service: Gastroenterology;  Laterality: N/A;   ESOPHAGOGASTRODUODENOSCOPY (EGD) WITH PROPOFOL N/A 09/08/2019   Procedure: ESOPHAGOGASTRODUODENOSCOPY (EGD) WITH PROPOFOL;  Surgeon: [name], MD;  Location: [facility];  Service: Gastroenterology;  Laterality: N/A;   EYE SURGERY     HIP ARTHROPLASTY Right 03/29/2020   Procedure: ARTHROPLASTY BIPOLAR HIP (HEMIARTHROPLASTY);  Surgeon: Corky Mull, MD;  Location: [facility];  Service: Orthopedics;  Laterality: Right;   INTRAVASCULAR PRESSURE WIRE/FFR STUDY N/A 03/11/2017   Procedure: INTRAVASCULAR PRESSURE WIRE/FFR STUDY;  Surgeon: Leonie Man, MD;  Location: [place] [facility];  Service: Cardiovascular;  Laterality: N/A;   INTRAVASCULAR ULTRASOUND/IVUS N/A 03/11/2017   Procedure: Intravascular Ultrasound/IVUS;  Surgeon: Leonie Man, MD;  Location: [place] [facility];  Service: Cardiovascular;  Laterality: N/A;   LEFT HEART CATH AND CORONARY ANGIOGRAPHY N/A 03/05/2017   Procedure: LEFT HEART CATH AND CORONARY ANGIOGRAPHY;  Surgeon: Isaias Cowman, MD;  Location: [place] [facility];  Service: Cardiovascular;  Laterality:  N/A;   PACEMAKER IMPLANT     pacemaker/defibrillator Left     Social History   Socioeconomic History   Marital status: Married    Spouse name: rodney   Number of children: 2   Years of education: Not on file   Highest education level: High school graduate  Occupational History   Not on file  Tobacco Use   Smoking status: Every Day    Packs/day: 0.25    Types: E-cigarettes, Cigarettes   Smokeless tobacco: Never  Vaping Use   Vaping Use: Former  Substance and Sexual Activity   Alcohol use: Not Currently    Comment: occasionally   Drug use: Yes    Comment: prescribed pain meds   Sexual activity: Not Currently  Other Topics Concern   Not on  file  Social History Narrative   Not on file   Social Determinants of Health   Financial Resource Strain: Not on file  Food Insecurity: Not on file  Transportation Needs: Not on file  Physical Activity: Not on file  Stress: Not on file  Social Connections: Not on file  Intimate Partner Violence: Not on file    Family History  Problem Relation Age of Onset   Hypertension Father    Heart attack Father    Depression Sister    Depression Brother    Depression Brother      Current Outpatient Medications:    allopurinol (ZYLOPRIM) 100 MG tablet, Take 50 mg by mouth daily., Disp: , Rfl:    aspirin EC 81 MG tablet, Take 81 mg by mouth at bedtime. , Disp: , Rfl:    dexamethasone (DECADRON) 4 MG tablet, Take 5 tablets (20 mg total) by mouth daily. Take the day after Velcade on days 2,5,9,12. Take with breakfast, Disp: 40 tablet, Rfl: 3   docusate sodium (COLACE) 100 MG capsule, Take 100 mg by mouth 2 (two) times daily., Disp: , Rfl:    Ensure Max Protein (ENSURE MAX PROTEIN) LIQD, Take 330 mLs (11 oz total) by mouth 2 (two) times daily between meals., Disp: , Rfl:    fexofenadine (ALLEGRA) 180 MG tablet, Take 180 mg by mouth daily., Disp: , Rfl:    FLUoxetine (PROZAC) 10 MG capsule, Take 10 mg by mouth daily., Disp: , Rfl:    gabapentin (NEURONTIN) 100 MG capsule, Take 100 mg by mouth 2 (two) times daily. Patient takes 238m in the morning and 9031mat night., Disp: , Rfl:    gabapentin (NEURONTIN) 300 MG capsule, Take 30092mn AM, 300m16m afternoon, 900mg25mnight (Patient taking differently: Patient takes 200mg 86mhe morning and 900mg a9mght.), Disp: 150 capsule, Rfl: 3   gabapentin (NEURONTIN) 600 MG tablet, Take 600 mg by mouth daily. Patient takes 200mg in73m morning and 900mg at 53mt., Disp: , Rfl:    hydrocortisone (ANUSOL-HC) 2.5 % rectal cream, Apply 1 application topically 2 (two) times daily. (Patient not taking: No sig reported), Disp: , Rfl:    Lactulose 20 GM/30ML SOLN,  Take 30 mLs (20 g total) by mouth in the morning and at bedtime., Disp: 450 mL, Rfl: 1   levothyroxine (SYNTHROID) 50 MCG tablet, Take by mouth., Disp: , Rfl:    levothyroxine (SYNTHROID) 75 MCG tablet, Take 1 tablet (75 mcg total) by mouth daily before breakfast. On Monday, Wednesday and Friday, Disp: , Rfl:    magnesium oxide (MAG-OX) 400 MG tablet, Take 200 mg by mouth daily., Disp: , Rfl:    mexiletine (MEXITIL)  200 MG capsule, Take 1 capsule (200 mg total) by mouth every 12 (twelve) hours., Disp: 60 capsule, Rfl: 1   midodrine (PROAMATINE) 10 MG tablet, Take 1 tablet (10 mg total) by mouth 2 (two) times daily with a meal., Disp: , Rfl:    OXYGEN, Inhale 2 L into the lungs continuous., Disp: , Rfl:    pantoprazole (PROTONIX) 40 MG tablet, Take 40 mg by mouth daily. , Disp: , Rfl:    polyethylene glycol (MIRALAX) 17 g packet, Take 17 g by mouth daily., Disp: 14 each, Rfl: 0   Prenatal Vit-Fe Fumarate-FA (PRENATAL MULTIVITAMIN) TABS tablet, Take 1 tablet by mouth daily., Disp: , Rfl:    simvastatin (ZOCOR) 40 MG tablet, Take 20 mg by mouth at bedtime. , Disp: , Rfl: 2   torsemide (DEMADEX) 20 MG tablet, Take 20 mg by mouth daily., Disp: , Rfl:    traZODone (DESYREL) 50 MG tablet, Take 50 mg by mouth at bedtime., Disp: , Rfl:    zolpidem (AMBIEN) 5 MG tablet, Take 1 tablet (5 mg total) by mouth at bedtime as needed for sleep. (Patient not taking: No sig reported), Disp: 30 tablet, Rfl: 0  Physical exam: There were no vitals filed for this visit. Physical Exam Constitutional:      Appearance: Normal appearance.  HENT:     Head: Normocephalic and atraumatic.  Eyes:     Pupils: Pupils are equal, round, and reactive to light.  Cardiovascular:     Rate and Rhythm: Normal rate and regular rhythm.     Heart sounds: Normal heart sounds. No murmur heard. Pulmonary:     Effort: Pulmonary effort is normal.     Breath sounds: Normal breath sounds. No wheezing.  Abdominal:     General: Bowel sounds  are normal. There is no distension.     Palpations: Abdomen is soft.     Tenderness: There is no abdominal tenderness.  Musculoskeletal:        General: Normal range of motion.     Cervical back: Normal range of motion.  Skin:    General: Skin is warm and dry.     Findings: No rash.  Neurological:     Mental Status: She is alert and oriented to person, place, and time.  Psychiatric:        Judgment: Judgment normal.     CMP Latest Ref Rng & Units 12/12/2020  Glucose 70 - 99 mg/dL 128(H)  BUN 8 - 23 mg/dL 65(H)  Creatinine 0.44 - 1.00 mg/dL 1.53(H)  Sodium 135 - 145 mmol/L 135  Potassium 3.5 - 5.1 mmol/L 4.1  Chloride 98 - 111 mmol/L 94(L)  CO2 22 - 32 mmol/L 32  Calcium 8.9 - 10.3 mg/dL 8.5(L)  Total Protein 6.5 - 8.1 g/dL 6.8  Total Bilirubin 0.3 - 1.2 mg/dL 0.5  Alkaline Phos 38 - 126 U/L 92  AST 15 - 41 U/L 18  ALT 0 - 44 U/L 21   CBC Latest Ref Rng & Units 12/12/2020  WBC 4.0 - 10.5 K/uL 8.6  Hemoglobin 12.0 - 15.0 g/dL 7.9(L)  Hematocrit 36.0 - 46.0 % 26.6(L)  Platelets 150 - 400 K/uL 134(L)    No images are attached to the encounter.  CT HEAD WO CONTRAST (5MM)  Result Date: 12/15/2020 CLINICAL DATA:  Head trauma, minor (Age >= 65y); Neck trauma (Age >= 65y) EXAM: CT HEAD WITHOUT CONTRAST CT CERVICAL SPINE WITHOUT CONTRAST TECHNIQUE: Multidetector CT imaging of the head and cervical spine was performed  following the standard protocol without intravenous contrast. Multiplanar CT image reconstructions of the cervical spine were also generated. COMPARISON:  CT cervical spine November 16 21. CT head June 25, 2020. FINDINGS: CT HEAD FINDINGS Brain: No evidence of acute large vascular territory infarction, hemorrhage, hydrocephalus, extra-axial collection or mass lesion/mass effect. Remote right frontal infarct with encephalomalacia. Patchy white matter hypoattenuation, compatible with chronic microvascular ischemic disease. Mild atrophy with ex vacuo ventricular dilation.  Partially empty sella. Vascular: No hyperdense vessel identified. Calcific atherosclerosis. Skull: Left frontal scalp contusion no acute fracture Sinuses/Orbits: Visualized sinuses are clear. Other: No mastoid effusions. CT CERVICAL SPINE FINDINGS Alignment: Similar mild reversal of the normal cervical lordosis. Similar mild anterolisthesis of C3 on C4 and C4 on C5, likely degenerative given stability and facet arthropathy at these levels. Skull base and vertebrae: No evidence of acute fracture. Osteopenia. Soft tissues and spinal canal: No prevertebral fluid or swelling. No visible canal hematoma. Disc levels: Mild multilevel degenerative disc disease. At least moderate multilevel facet arthropathy, greatest in the upper cervical spine. Upper chest: Visualized lung apices are clear. Other: Calcific atherosclerosis of the carotids with retropharyngeal course. IMPRESSION: CT head: 1. No evidence of acute intracranial abnormality. 2. Left frontal scalp contusion without acute fracture. 3. Remote right frontal infarct with encephalomalacia. 4. Chronic microvascular ischemic disease. CT cervical spine: 1. No evidence of acute fracture or traumatic malalignment 2. Similar degenerative change and subluxation. Electronically Signed   By: Margaretha Sheffield MD   On: 12/15/2020 16:52   CT Cervical Spine Wo Contrast  Result Date: 12/15/2020 CLINICAL DATA:  Head trauma, minor (Age >= 65y); Neck trauma (Age >= 65y) EXAM: CT HEAD WITHOUT CONTRAST CT CERVICAL SPINE WITHOUT CONTRAST TECHNIQUE: Multidetector CT imaging of the head and cervical spine was performed following the standard protocol without intravenous contrast. Multiplanar CT image reconstructions of the cervical spine were also generated. COMPARISON:  CT cervical spine November 16 21. CT head June 25, 2020. FINDINGS: CT HEAD FINDINGS Brain: No evidence of acute large vascular territory infarction, hemorrhage, hydrocephalus, extra-axial collection or mass  lesion/mass effect. Remote right frontal infarct with encephalomalacia. Patchy white matter hypoattenuation, compatible with chronic microvascular ischemic disease. Mild atrophy with ex vacuo ventricular dilation. Partially empty sella. Vascular: No hyperdense vessel identified. Calcific atherosclerosis. Skull: Left frontal scalp contusion no acute fracture Sinuses/Orbits: Visualized sinuses are clear. Other: No mastoid effusions. CT CERVICAL SPINE FINDINGS Alignment: Similar mild reversal of the normal cervical lordosis. Similar mild anterolisthesis of C3 on C4 and C4 on C5, likely degenerative given stability and facet arthropathy at these levels. Skull base and vertebrae: No evidence of acute fracture. Osteopenia. Soft tissues and spinal canal: No prevertebral fluid or swelling. No visible canal hematoma. Disc levels: Mild multilevel degenerative disc disease. At least moderate multilevel facet arthropathy, greatest in the upper cervical spine. Upper chest: Visualized lung apices are clear. Other: Calcific atherosclerosis of the carotids with retropharyngeal course. IMPRESSION: CT head: 1. No evidence of acute intracranial abnormality. 2. Left frontal scalp contusion without acute fracture. 3. Remote right frontal infarct with encephalomalacia. 4. Chronic microvascular ischemic disease. CT cervical spine: 1. No evidence of acute fracture or traumatic malalignment 2. Similar degenerative change and subluxation. Electronically Signed   By: Margaretha Sheffield MD   On: 12/15/2020 16:52     Assessment and plan- Patient is a 80 y.o. female who presents to symptom management for follow-up due to concerns of low blood pressure readings and dizziness and unsteadiness on her feet.  Symptoms  started approximately 6 to 8 days ago since her last treatment.   Multiple myeloma- Currently on treatment with Velcade; unable to tolerate Revlimid.  Most recent treatment was on 12/12/2020.  Since then, daughter reports her blood  pressure has been low and she is very unsteady on her feet.  She has had a recent fall resulting in a head laceration with hematoma and required ED visit.  Her hemoglobin continues to trend down since reinitiating Velcade back in June.  She has required 1 unit PRBC for a hemoglobin of 6.5. Today's labs show hemoglobin of 7.6.   Hypotension.- Blood pressure today is soft at 96/59.  She is taking torsemide daily for lower extremity swelling.  Given recent fall and low blood pressure readings recommend she hold torsemide at this time.  Diarrhea is also contributing.  Will get sample of her stool to rule out infection. No evidence of dehydration on recent lab draw.  She is currently taking midodrine 5 mg 3 times per day.  Per up-to-date, we can increase this to 10 mg 3 times per day.  Recommend she start 10 mg 2-3 daily but to check BP prior. If bp is > 643 systolically due not give.  Avoid taking midodrine right before bedtime due to possible supine hypertension.  Diarrhea- Over the past several days this has worsened and is described as foul smelling. We will collect sample today to rule out infection.  Denies any fevers or abdominal concerns. This likely also contributing to low blood pressures d/t fluid loss. Reports 4-5 episodes daily.   Anemia- Labs from 12/20/20 show hemoglobin 7.6. Hemoglobin has been slowly trending down since reinitiating Velcade back in June.  She has required 1 unit PRBC for a hemoglobin of 6.5. Give she is symptomatic with significant fatigue and dizziness and recent fall recommend 1 unit prbc in the next couple days.   Disposition- No IV fluids today.  PRBC in 1-2 days.  Increase Midodrine up to 10 mg TID for BP < 838 systolic.   Stool Sample.  Hold Torsemide. RTC as scheduled for next cycle of Velcade.   No diagnosis found.  Patient expressed understanding and was in agreement with this plan. She also understands that She can call clinic at any time with any questions,  concerns, or complaints.   I spent 25 minutes dedicated to the care of this patient (face-to-face and non-face-to-face) on the date of the encounter to include what is described in the assessment and plan.  Thank you for allowing me to participate in the care of this very pleasant patient.    Jacquelin Hawking, NP [place] [facility] Cell - [phone] Pager- [phone] 12/20/2020 1:35 PM

## 2020-12-20 NOTE — Progress Notes (Signed)
Pt reports fatigue and weakness over the last couple of weeks along with low BP, SOB, and dizziness. Has had several falls; most recent yesterday. Multiple bruises and skin tears scattered over skin. Also reports she is having diarrhea. BLE edema noted. Daughter states they have held diuretic 2 days in a row now.

## 2020-12-20 NOTE — Telephone Encounter (Signed)
Patient called to report that she is becoming more and more lethargic, she had diarrhea for 3 days that has resolved with imodium, she fell three times yesterday and sustained bruising and soreness.  She is not available for [facility] today and has not contacted her PCP. She has a recent ED visit secondary to a fall.

## 2020-12-21 ENCOUNTER — Encounter: Payer: Self-pay | Admitting: Oncology

## 2020-12-21 NOTE — Telephone Encounter (Signed)
Error

## 2020-12-22 ENCOUNTER — Inpatient Hospital Stay: Payer: Medicare Other

## 2020-12-22 ENCOUNTER — Other Ambulatory Visit: Payer: Self-pay

## 2020-12-22 DIAGNOSIS — C9000 Multiple myeloma not having achieved remission: Secondary | ICD-10-CM

## 2020-12-22 DIAGNOSIS — Z5111 Encounter for antineoplastic chemotherapy: Secondary | ICD-10-CM | POA: Diagnosis not present

## 2020-12-22 DIAGNOSIS — D649 Anemia, unspecified: Secondary | ICD-10-CM

## 2020-12-22 LAB — COMPREHENSIVE METABOLIC PANEL
ALT: 13 U/L (ref 0–44)
AST: 12 U/L — ABNORMAL LOW (ref 15–41)
Albumin: 3.6 g/dL (ref 3.5–5.0)
Alkaline Phosphatase: 96 U/L (ref 38–126)
Anion gap: 10 (ref 5–15)
BUN: 42 mg/dL — ABNORMAL HIGH (ref 8–23)
CO2: 27 mmol/L (ref 22–32)
Calcium: 8.3 mg/dL — ABNORMAL LOW (ref 8.9–10.3)
Chloride: 99 mmol/L (ref 98–111)
Creatinine, Ser: 1.89 mg/dL — ABNORMAL HIGH (ref 0.44–1.00)
GFR, Estimated: 27 mL/min — ABNORMAL LOW (ref >60)
Glucose, Bld: 117 mg/dL — ABNORMAL HIGH (ref 70–99)
Potassium: 4.2 mmol/L (ref 3.5–5.1)
Sodium: 136 mmol/L (ref 135–145)
Total Bilirubin: 0.5 mg/dL (ref 0.3–1.2)
Total Protein: 6.3 g/dL — ABNORMAL LOW (ref 6.5–8.1)

## 2020-12-22 LAB — CBC WITH DIFFERENTIAL/PLATELET
Abs Immature Granulocytes: 0.75 10*3/uL — ABNORMAL HIGH (ref 0.00–0.07)
Basophils Absolute: 0 10*3/uL (ref 0.0–0.1)
Basophils Relative: 0 %
Eosinophils Absolute: 0.1 10*3/uL (ref 0.0–0.5)
Eosinophils Relative: 0 %
HCT: 25.9 % — ABNORMAL LOW (ref 36.0–46.0)
Hemoglobin: 7.6 g/dL — ABNORMAL LOW (ref 12.0–15.0)
Immature Granulocytes: 5 %
Lymphocytes Relative: 10 %
Lymphs Abs: 1.7 10*3/uL (ref 0.7–4.0)
MCH: 26.7 pg (ref 26.0–34.0)
MCHC: 29.3 g/dL — ABNORMAL LOW (ref 30.0–36.0)
MCV: 90.9 fL (ref 80.0–100.0)
Monocytes Absolute: 1.6 10*3/uL — ABNORMAL HIGH (ref 0.1–1.0)
Monocytes Relative: 10 %
Neutro Abs: 12.2 10*3/uL — ABNORMAL HIGH (ref 1.7–7.7)
Neutrophils Relative %: 75 %
Platelets: 291 10*3/uL (ref 150–400)
RBC: 2.85 MIL/uL — ABNORMAL LOW (ref 3.87–5.11)
RDW: 19.3 % — ABNORMAL HIGH (ref 11.5–15.5)
WBC: 16.3 10*3/uL — ABNORMAL HIGH (ref 4.0–10.5)
nRBC: 0.4 % — ABNORMAL HIGH (ref 0.0–0.2)

## 2020-12-22 LAB — SAMPLE TO BLOOD BANK

## 2020-12-22 LAB — PREPARE RBC (CROSSMATCH)

## 2020-12-22 MED ORDER — ACETAMINOPHEN 325 MG PO TABS
650.0000 mg | ORAL_TABLET | Freq: Once | ORAL | Status: AC
  Administered 2020-12-22: 650 mg via ORAL
  Filled 2020-12-22: qty 2

## 2020-12-22 MED ORDER — DIPHENHYDRAMINE HCL 25 MG PO CAPS
25.0000 mg | ORAL_CAPSULE | Freq: Once | ORAL | Status: AC
  Administered 2020-12-22: 25 mg via ORAL
  Filled 2020-12-22: qty 1

## 2020-12-22 MED ORDER — SODIUM CHLORIDE 0.9% IV SOLUTION
Freq: Once | INTRAVENOUS | Status: AC
  Filled 2020-12-22: qty 250

## 2020-12-22 NOTE — Patient Instructions (Signed)
Blood Transfusion, Adult, Care After This sheet gives you information about how to care for yourself after your procedure. Your doctor may also give you more specific instructions. If youhave problems or questions, contact your doctor. What can I expect after the procedure? After the procedure, it is common to have: Bruising and soreness at the IV site. A fever or chills on the day of the procedure. This may be your body's response to the new blood cells received. A headache. Follow these instructions at home: Insertion site care     Follow instructions from your doctor about how to take care of your insertion site. This is where an IV tube was put into your vein. Make sure you: Wash your hands with soap and water before and after you change your bandage (dressing). If you cannot use soap and water, use hand sanitizer. Change your bandage as told by your doctor. Check your insertion site every day for signs of infection. Check for: Redness, swelling, or pain. Bleeding from the site. Warmth. Pus or a bad smell. General instructions Take over-the-counter and prescription medicines only as told by your doctor. Rest as told by your doctor. Go back to your normal activities as told by your doctor. Keep all follow-up visits as told by your doctor. This is important. Contact a doctor if: You have itching or red, swollen areas of skin (hives). You feel worried or nervous (anxious). You feel weak after doing your normal activities. You have redness, swelling, warmth, or pain around the insertion site. You have blood coming from the insertion site, and the blood does not stop with pressure. You have pus or a bad smell coming from the insertion site. Get help right away if: You have signs of a serious reaction. This may be coming from an allergy or the body's defense system (immune system). Signs include: Trouble breathing or shortness of breath. Swelling of the face or feeling warm  (flushed). Fever or chills. Head, chest, or back pain. Dark pee (urine) or blood in the pee. Widespread rash. Fast heartbeat. Feeling dizzy or light-headed. You may receive your blood transfusion in an outpatient setting. If so, youwill be told whom to contact to report any reactions. These symptoms may be an emergency. Do not wait to see if the symptoms will go away. Get medical help right away. Call your local emergency services (911 in the [place] Do not drive yourself to the hospital. Summary Bruising and soreness at the IV site are common. Check your insertion site every day for signs of infection. Rest as told by your doctor. Go back to your normal activities as told by your doctor. Get help right away if you have signs of a serious reaction. This information is not intended to replace advice given to you by your health care provider. Make sure you discuss any questions you have with your healthcare provider. Document Revised: 10/22/2018 Document Reviewed: 10/22/2018 Elsevier Patient Education  [employer].

## 2020-12-23 LAB — BPAM RBC
Blood Product Expiration Date: 202209012359
ISSUE DATE / TIME: 202208121104
Unit Type and Rh: 7300

## 2020-12-23 LAB — TYPE AND SCREEN
ABO/RH(D): B POS
Antibody Screen: NEGATIVE
Unit division: 0

## 2020-12-23 NOTE — Progress Notes (Signed)
[facility]  [phone] [phone]  ID: Clela Hagadorn Foxworthy OB: 10-Dec-1940  MR#: [id number]  VZD#:[id number]  [name], MD as PCP - General (Family Medicine) Lloyd Huger, MD as Consulting Physician (Oncology)  CHIEF COMPLAINT: Multiple myeloma, subdural hematoma.  INTERVAL HISTORY: Patient returns to clinic today for further evaluation and consideration of cycle 6, day 1 of Velcade.  She has increased weakness and fatigue as well as worsening peripheral edema and a nearly 16 pound weight gain.  She is unsteady on her feet and has had 3 falls in the interim.  She recently had diarrhea, but this has resolved. She does not complain of any neuropathy today.  She has no neurologic complaints.  She has chronic shortness of breath and requires oxygen 24 hours/day.  She denies any fevers.  She has a fair appetite.  She denies any chest pain, cough, or hemoptysis. She denies any nausea, vomiting, or constipation. She has no melena or hematochezia.  She has no urinary complaints.  Patient feels generally terrible, but offers no further specific complaints today.  REVIEW OF SYSTEMS:   Review of Systems  Constitutional:  Positive for malaise/fatigue. Negative for fever.  Respiratory:  Positive for shortness of breath. Negative for cough and hemoptysis.   Cardiovascular:  Positive for leg swelling. Negative for chest pain.  Gastrointestinal: Negative.  Negative for abdominal pain, diarrhea, melena and nausea.  Genitourinary: Negative.  Negative for dysuria and hematuria.  Musculoskeletal:  Positive for back pain. Negative for joint pain.  Skin: Negative.  Negative for rash.  Neurological:  Positive for weakness. Negative for dizziness, sensory change, focal weakness and headaches.  Psychiatric/Behavioral: Negative.  The patient is not nervous/anxious.    As per HPI. Otherwise, a complete review of systems is negative.  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[place])    Prolonged Q-T interval on ECG    Thyroid disease     PAST SURGICAL HISTORY: Past Surgical History:  Procedure Laterality Date   CENTRAL LINE INSERTION  03/11/2017   Procedure: CENTRAL LINE INSERTION;  Surgeon: Leonie Man, MD;  Location: [facility];  Service: Cardiovascular;;   CHOLECYSTECTOMY     COLONOSCOPY WITH PROPOFOL N/A 09/01/2019   Procedure: COLONOSCOPY WITH PROPOFOL;  Surgeon: Toledo, Benay Pike, MD;  Location: [facility];  Service: Gastroenterology;  Laterality: N/A;   CORONARY STENT INTERVENTION W/IMPELLA N/A 03/11/2017   Procedure: Coronary Stent Intervention w/Impella;  Surgeon: Leonie Man, MD;  Location: [facility];  Service: Cardiovascular;  Laterality: N/A;   ESOPHAGOGASTRODUODENOSCOPY (EGD) WITH PROPOFOL N/A 09/01/2019   Procedure: ESOPHAGOGASTRODUODENOSCOPY (EGD) WITH PROPOFOL;  Surgeon: Toledo, Benay Pike, MD;  Location: [facility];  Service: Gastroenterology;  Laterality: N/A;   ESOPHAGOGASTRODUODENOSCOPY (EGD) WITH PROPOFOL N/A 09/08/2019   Procedure: ESOPHAGOGASTRODUODENOSCOPY (EGD) WITH PROPOFOL;  Surgeon: [name], MD;  Location: [facility];  Service: Gastroenterology;  Laterality: N/A;   EYE SURGERY     HIP ARTHROPLASTY Right 03/29/2020   Procedure: ARTHROPLASTY BIPOLAR HIP (HEMIARTHROPLASTY);  Surgeon: Corky Mull, MD;  Location: [facility];  Service: Orthopedics;  Laterality: Right;   INTRAVASCULAR PRESSURE WIRE/FFR STUDY N/A 03/11/2017   Procedure: INTRAVASCULAR PRESSURE WIRE/FFR STUDY;  Surgeon: Leonie Man,  MD;  Location: [place];  Service: Cardiovascular;  Laterality: N/A;   INTRAVASCULAR ULTRASOUND/IVUS N/A 03/11/2017   Procedure: Intravascular Ultrasound/IVUS;  Surgeon: Leonie Man, MD;  Location: [place];  Service: Cardiovascular;  Laterality: N/A;   LEFT HEART CATH AND CORONARY ANGIOGRAPHY N/A 03/05/2017   Procedure: LEFT HEART CATH AND CORONARY ANGIOGRAPHY;  Surgeon: Isaias Cowman, MD;  Location: [place] [facility];  Service: Cardiovascular;  Laterality: N/A;   PACEMAKER IMPLANT     pacemaker/defibrillator Left     FAMILY HISTORY: Family History  Problem Relation Age of Onset   Hypertension Father    Heart attack Father    Depression Sister    Depression Brother    Depression Brother     ADVANCED DIRECTIVES (Y/N):  N  HEALTH MAINTENANCE: Social History   Tobacco Use   Smoking status: Every Day    Packs/day: 0.25    Types: E-cigarettes, Cigarettes   Smokeless tobacco: Never  Vaping Use   Vaping Use: Former  Substance Use Topics   Alcohol use: Not Currently    Comment: occasionally   Drug use: Yes    Comment: prescribed pain meds     Colonoscopy:  PAP:  Bone density:  Lipid panel:  Allergies  Allergen Reactions   Celebrex [Celecoxib] Anaphylaxis   Glipizide Anaphylaxis   Levaquin [Levofloxacin In D5w] Other (See Comments)    Heart arrhthymias   Levofloxacin Other (See Comments) and Palpitations    ICD fired   Lisinopril Swelling    Lip and facial swelling   Sulfa Antibiotics Other (See Comments) and Anaphylaxis    Reaction: unknown   Metformin Other (See Comments)    Gi tolerance    Penicillins Rash and Other (See Comments)    Has patient had a PCN reaction causing immediate rash, facial/tongue/throat swelling, SOB or lightheadedness with hypotension: Unknown Has patient had a PCN reaction causing severe rash involving mucus membranes or skin necrosis: No Has patient had a PCN reaction that required hospitalization:  No Has patient had a PCN reaction occurring within the last 10 years: No If all of the above answers are "NO", then may proceed with Cephalosporin use.     Current Outpatient Medications  Medication Sig Dispense Refill   allopurinol (ZYLOPRIM) 100 MG tablet Take 50 mg by mouth daily.     aspirin EC 81 MG tablet Take 81 mg by mouth at bedtime.      dexamethasone (DECADRON) 4 MG tablet Take 5 tablets (20 mg total) by mouth daily. Take the day after Velcade on days 2,5,9,12. Take with breakfast 40 tablet 3   docusate sodium (COLACE) 100 MG capsule Take 100 mg by mouth 2 (two) times daily.     Ensure Max Protein (ENSURE MAX PROTEIN) LIQD Take 330 mLs (11 oz total) by mouth 2 (two) times daily between meals.     fexofenadine (ALLEGRA) 180 MG tablet Take 180 mg by mouth daily.     FLUoxetine (PROZAC) 10 MG capsule Take 10 mg by mouth daily.     gabapentin (NEURONTIN) 300 MG capsule Take 374m in AM, 303min afternoon, 90010mt night (Patient taking differently: Patient takes 200m71m the morning and 900mg94mnight.) 150 capsule 3   gabapentin (NEURONTIN) 600 MG tablet Take  600 mg by mouth daily. Patient takes 276m in the morning and 9092mat night.     hydrocortisone (ANUSOL-HC) 2.5 % rectal cream Apply 1 application topically 2 (two) times daily.     Lactulose 20 GM/30ML SOLN Take 30 mLs (20 g total) by mouth in the morning and at bedtime. 450 mL 1   levothyroxine (SYNTHROID) 50 MCG tablet Take by mouth.     levothyroxine (SYNTHROID) 75 MCG tablet Take 1 tablet (75 mcg total) by mouth daily before breakfast. On Monday, Wednesday and Friday     magnesium oxide (MAG-OX) 400 MG tablet Take 200 mg by mouth daily.     mexiletine (MEXITIL) 200 MG capsule Take 1 capsule (200 mg total) by mouth every 12 (twelve) hours. 60 capsule 1   midodrine (PROAMATINE) 10 MG tablet Take 1 tablet (10 mg total) by mouth 2 (two) times daily with a meal.     OXYGEN Inhale 2 L into the lungs continuous.     pantoprazole  (PROTONIX) 40 MG tablet Take 40 mg by mouth daily.      polyethylene glycol (MIRALAX) 17 g packet Take 17 g by mouth daily. 14 each 0   Prenatal Vit-Fe Fumarate-FA (PRENATAL MULTIVITAMIN) TABS tablet Take 1 tablet by mouth daily.     Probiotic Product (PROBIOTIC DAILY PO) Take 1 tablet by mouth daily.     simvastatin (ZOCOR) 40 MG tablet Take 20 mg by mouth at bedtime.   2   torsemide (DEMADEX) 20 MG tablet Take 20 mg by mouth daily.     traZODone (DESYREL) 50 MG tablet Take 50 mg by mouth at bedtime.     gabapentin (NEURONTIN) 100 MG capsule Take 100 mg by mouth 2 (two) times daily. Patient takes 20039mn the morning and 900m31m night. (Patient not taking: Reported on 12/26/2020)     zolpidem (AMBIEN) 5 MG tablet Take 1 tablet (5 mg total) by mouth at bedtime as needed for sleep. (Patient not taking: No sig reported) 30 tablet 0   No current facility-administered medications for this visit.    OBJECTIVE: Vitals:   12/26/20 0926  BP: 99/63  Pulse: 81  Resp: 16  Temp: (!) 97.5 F (36.4 C)     Body mass index is 30.64 kg/m.    ECOG FS:2 - Symptomatic, <50% confined to bed  General: Well-developed, well-nourished, no acute distress.  Sitting in wheelchair. Eyes: Pink conjunctiva, anicteric sclera. HEENT: Normocephalic, moist mucous membranes. Lungs: No audible wheezing or coughing. Heart: Regular rate and rhythm. Abdomen: Soft, nontender, no obvious distention. Musculoskeletal: 3+ bilateral peripheral edema with skin ulcerations. Neuro: Alert, answering all questions appropriately. Cranial nerves grossly intact. Skin: No rashes or petechiae noted. Psych: Normal affect.   LAB RESULTS:  Lab Results  Component Value Date   NA 138 12/26/2020   K 4.1 12/26/2020   CL 102 12/26/2020   CO2 27 12/26/2020   GLUCOSE 113 (H) 12/26/2020   BUN 32 (H) 12/26/2020   CREATININE 1.66 (H) 12/26/2020   CALCIUM 8.5 (L) 12/26/2020   PROT 6.5 12/26/2020   ALBUMIN 3.6 12/26/2020   AST 13 (L)  12/26/2020   ALT 13 12/26/2020   ALKPHOS 100 12/26/2020   BILITOT 0.8 12/26/2020   GFRNONAA 31 (L) 12/26/2020   GFRAA 30 (L) 09/09/2019    Lab Results  Component Value Date   WBC 11.2 (H) 12/26/2020   NEUTROABS 8.8 (H) 12/26/2020   HGB 8.4 (L) 12/26/2020   HCT 28.4 (L) 12/26/2020  MCV 92.8 12/26/2020   PLT 236 12/26/2020   Lab Results  Component Value Date   IRON 85 05/23/2020   TIBC 421 05/23/2020   IRONPCTSAT 20 05/23/2020   Lab Results  Component Value Date   FERRITIN 50 05/23/2020     STUDIES: CT HEAD WO CONTRAST (5MM)  Result Date: 12/15/2020 CLINICAL DATA:  Head trauma, minor (Age >= 65y); Neck trauma (Age >= 65y) EXAM: CT HEAD WITHOUT CONTRAST CT CERVICAL SPINE WITHOUT CONTRAST TECHNIQUE: Multidetector CT imaging of the head and cervical spine was performed following the standard protocol without intravenous contrast. Multiplanar CT image reconstructions of the cervical spine were also generated. COMPARISON:  CT cervical spine November 16 21. CT head June 25, 2020. FINDINGS: CT HEAD FINDINGS Brain: No evidence of acute large vascular territory infarction, hemorrhage, hydrocephalus, extra-axial collection or mass lesion/mass effect. Remote right frontal infarct with encephalomalacia. Patchy white matter hypoattenuation, compatible with chronic microvascular ischemic disease. Mild atrophy with ex vacuo ventricular dilation. Partially empty sella. Vascular: No hyperdense vessel identified. Calcific atherosclerosis. Skull: Left frontal scalp contusion no acute fracture Sinuses/Orbits: Visualized sinuses are clear. Other: No mastoid effusions. CT CERVICAL SPINE FINDINGS Alignment: Similar mild reversal of the normal cervical lordosis. Similar mild anterolisthesis of C3 on C4 and C4 on C5, likely degenerative given stability and facet arthropathy at these levels. Skull base and vertebrae: No evidence of acute fracture. Osteopenia. Soft tissues and spinal canal: No prevertebral  fluid or swelling. No visible canal hematoma. Disc levels: Mild multilevel degenerative disc disease. At least moderate multilevel facet arthropathy, greatest in the upper cervical spine. Upper chest: Visualized lung apices are clear. Other: Calcific atherosclerosis of the carotids with retropharyngeal course. IMPRESSION: CT head: 1. No evidence of acute intracranial abnormality. 2. Left frontal scalp contusion without acute fracture. 3. Remote right frontal infarct with encephalomalacia. 4. Chronic microvascular ischemic disease. CT cervical spine: 1. No evidence of acute fracture or traumatic malalignment 2. Similar degenerative change and subluxation. Electronically Signed   By: Margaretha Sheffield MD   On: 12/15/2020 16:52   CT Cervical Spine Wo Contrast  Result Date: 12/15/2020 CLINICAL DATA:  Head trauma, minor (Age >= 65y); Neck trauma (Age >= 65y) EXAM: CT HEAD WITHOUT CONTRAST CT CERVICAL SPINE WITHOUT CONTRAST TECHNIQUE: Multidetector CT imaging of the head and cervical spine was performed following the standard protocol without intravenous contrast. Multiplanar CT image reconstructions of the cervical spine were also generated. COMPARISON:  CT cervical spine November 16 21. CT head June 25, 2020. FINDINGS: CT HEAD FINDINGS Brain: No evidence of acute large vascular territory infarction, hemorrhage, hydrocephalus, extra-axial collection or mass lesion/mass effect. Remote right frontal infarct with encephalomalacia. Patchy white matter hypoattenuation, compatible with chronic microvascular ischemic disease. Mild atrophy with ex vacuo ventricular dilation. Partially empty sella. Vascular: No hyperdense vessel identified. Calcific atherosclerosis. Skull: Left frontal scalp contusion no acute fracture Sinuses/Orbits: Visualized sinuses are clear. Other: No mastoid effusions. CT CERVICAL SPINE FINDINGS Alignment: Similar mild reversal of the normal cervical lordosis. Similar mild anterolisthesis of C3 on  C4 and C4 on C5, likely degenerative given stability and facet arthropathy at these levels. Skull base and vertebrae: No evidence of acute fracture. Osteopenia. Soft tissues and spinal canal: No prevertebral fluid or swelling. No visible canal hematoma. Disc levels: Mild multilevel degenerative disc disease. At least moderate multilevel facet arthropathy, greatest in the upper cervical spine. Upper chest: Visualized lung apices are clear. Other: Calcific atherosclerosis of the carotids with retropharyngeal course. IMPRESSION: CT head: 1. No  evidence of acute intracranial abnormality. 2. Left frontal scalp contusion without acute fracture. 3. Remote right frontal infarct with encephalomalacia. 4. Chronic microvascular ischemic disease. CT cervical spine: 1. No evidence of acute fracture or traumatic malalignment 2. Similar degenerative change and subluxation. Electronically Signed   By: Margaretha Sheffield MD   On: 12/15/2020 16:52    ASSESSMENT: Multiple myeloma, fractured right hip.  PLAN:    1.  Multiple myeloma: Bone marrow biopsy confirmed diagnosis with plasma cells up to 90% of biopsy.  Cytogenetics are reported as normal.  Metastatic bone survey from March 03, 2020 reviewed independently with multiple skeletal lucencies consistent with myeloma. Her M spike was initially 1.2 and initially trended down to 0.1, but more recently has trended up to 0.7 IgA levels initially were 2773, trended down to normal, but now increased to 1197. Kappa and lambda free light chains continue to be within normal limits. Initial plan was to give Velcade on days 1, 4, 8, and 11 along with 10 mg Revlimid on days 1 through 14. Patient will also receive weekly 20 mg dexamethasone.  Revlimid was discontinued secondary to intolerance.  Patient did not receive treatment between June 20, 2020 and November 14, 2020.  Plan to delay any further treatment until patient's cardiac and peripheral edema have been addressed.  She has an  appointment in the heart failure clinic on January 03, 2021. Return to clinic on January 09, 2021 for further evaluation and consideration of reinitiating treatment.    2.  Anemia: Hemoglobin has improved to 8.4. Patient does not require a transfusion today. Likely multifactorial with chronic renal insufficiency, underlying myeloma, as well as history of recent GI bleed.  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  3.  Chronic renal insufficiency: Chronic and unchanged. Creatinine is 1.66 today. 4.  Ascites/peripheral edema: Significantly worse today. Hold treatment. Appointment at heart failure clinic as above.    5.  Angiodysplastic lesions: Continue follow-up with GI as scheduled. 6. Constipation: Patient does not complain of this today. Continue MiraLAX and lactulose as needed. 7.  Right hip fracture: Continue rehab and follow-up with orthopedics as scheduled. Continue Percocet as needed. 8. Thrombocytopenia: Resolved. 9.  Peripheral neuropathy: Continue 700 mg gabapentin at night in addition to 100 mg in the morning and in the afternoon.   10.  Right foot pain: Patient does not complain of this today.  Continue hydrocodone as needed.  Patient expressed understanding and was in agreement with this plan. She also understands that She can call clinic at any time with any questions, concerns, or complaints.    Lloyd Huger, MD   12/26/2020 6:52 PM

## 2020-12-25 ENCOUNTER — Other Ambulatory Visit: Payer: Self-pay

## 2020-12-26 ENCOUNTER — Encounter: Payer: Self-pay | Admitting: Oncology

## 2020-12-26 ENCOUNTER — Inpatient Hospital Stay (HOSPITAL_BASED_OUTPATIENT_CLINIC_OR_DEPARTMENT_OTHER): Payer: Medicare Other | Admitting: Oncology

## 2020-12-26 ENCOUNTER — Inpatient Hospital Stay: Payer: Medicare Other

## 2020-12-26 VITALS — BP 99/63 | HR 81 | Temp 97.5°F | Resp 16 | Wt 175.7 lb

## 2020-12-26 DIAGNOSIS — C9000 Multiple myeloma not having achieved remission: Secondary | ICD-10-CM

## 2020-12-26 DIAGNOSIS — Z5111 Encounter for antineoplastic chemotherapy: Secondary | ICD-10-CM | POA: Diagnosis not present

## 2020-12-26 LAB — COMPREHENSIVE METABOLIC PANEL
ALT: 13 U/L (ref 0–44)
AST: 13 U/L — ABNORMAL LOW (ref 15–41)
Albumin: 3.6 g/dL (ref 3.5–5.0)
Alkaline Phosphatase: 100 U/L (ref 38–126)
Anion gap: 9 (ref 5–15)
BUN: 32 mg/dL — ABNORMAL HIGH (ref 8–23)
CO2: 27 mmol/L (ref 22–32)
Calcium: 8.5 mg/dL — ABNORMAL LOW (ref 8.9–10.3)
Chloride: 102 mmol/L (ref 98–111)
Creatinine, Ser: 1.66 mg/dL — ABNORMAL HIGH (ref 0.44–1.00)
GFR, Estimated: 31 mL/min — ABNORMAL LOW (ref >60)
Glucose, Bld: 113 mg/dL — ABNORMAL HIGH (ref 70–99)
Potassium: 4.1 mmol/L (ref 3.5–5.1)
Sodium: 138 mmol/L (ref 135–145)
Total Bilirubin: 0.8 mg/dL (ref 0.3–1.2)
Total Protein: 6.5 g/dL (ref 6.5–8.1)

## 2020-12-26 LAB — CBC WITH DIFFERENTIAL/PLATELET
Abs Immature Granulocytes: 0.28 10*3/uL — ABNORMAL HIGH (ref 0.00–0.07)
Basophils Absolute: 0 10*3/uL (ref 0.0–0.1)
Basophils Relative: 0 %
Eosinophils Absolute: 0 10*3/uL (ref 0.0–0.5)
Eosinophils Relative: 0 %
HCT: 28.4 % — ABNORMAL LOW (ref 36.0–46.0)
Hemoglobin: 8.4 g/dL — ABNORMAL LOW (ref 12.0–15.0)
Immature Granulocytes: 3 %
Lymphocytes Relative: 9 %
Lymphs Abs: 1 10*3/uL (ref 0.7–4.0)
MCH: 27.5 pg (ref 26.0–34.0)
MCHC: 29.6 g/dL — ABNORMAL LOW (ref 30.0–36.0)
MCV: 92.8 fL (ref 80.0–100.0)
Monocytes Absolute: 1 10*3/uL (ref 0.1–1.0)
Monocytes Relative: 9 %
Neutro Abs: 8.8 10*3/uL — ABNORMAL HIGH (ref 1.7–7.7)
Neutrophils Relative %: 79 %
Platelets: 236 10*3/uL (ref 150–400)
RBC: 3.06 MIL/uL — ABNORMAL LOW (ref 3.87–5.11)
RDW: 19.9 % — ABNORMAL HIGH (ref 11.5–15.5)
WBC: 11.2 10*3/uL — ABNORMAL HIGH (ref 4.0–10.5)
nRBC: 0.4 % — ABNORMAL HIGH (ref 0.0–0.2)

## 2020-12-26 LAB — SAMPLE TO BLOOD BANK

## 2020-12-26 NOTE — Progress Notes (Signed)
Patient has bilateral leg/ankle edema that is getting worse and has red places.  Does have a 16 lb wt gain since las documented wt  10 days ago.  Reports having 3 falls since last visit due to leg weakness.

## 2020-12-27 ENCOUNTER — Other Ambulatory Visit: Payer: Self-pay

## 2020-12-27 ENCOUNTER — Encounter (HOSPITAL_BASED_OUTPATIENT_CLINIC_OR_DEPARTMENT_OTHER): Payer: Medicare Other | Admitting: Internal Medicine

## 2020-12-27 DIAGNOSIS — L98492 Non-pressure chronic ulcer of skin of other sites with fat layer exposed: Secondary | ICD-10-CM

## 2020-12-27 DIAGNOSIS — S61411D Laceration without foreign body of right hand, subsequent encounter: Secondary | ICD-10-CM

## 2020-12-27 DIAGNOSIS — E11622 Type 2 diabetes mellitus with other skin ulcer: Secondary | ICD-10-CM | POA: Diagnosis not present

## 2020-12-27 NOTE — Progress Notes (Signed)
LOLLY, GLAUS ([id number]) Visit Report for 12/27/2020 Chief Complaint Document Details Patient Name: Heather Peterson, Heather Peterson. Date of Service: 12/27/2020 10:00 AM Medical Record Number: [id number] Patient Account Number: [id number] Date of Birth/Sex: 1940/08/30 (80 y.o. F) Treating RN: Dolan Amen Primary Care Provider: Thereasa Distance Other Clinician: Referring Provider: Thereasa Distance Treating Provider/Extender: Yaakov Guthrie in Treatment: 5 Information Obtained from: Patient Chief Complaint Right hand wound Electronic Signature(s) Signed: 12/27/2020 10:45:55 AM By: Kalman Shan DO Entered By: Kalman Shan on 12/27/2020 10:42:26 Heather Peterson ([id number]) -------------------------------------------------------------------------------- Debridement Details Patient Name: Heather Peterson. Date of Service: 12/27/2020 10:00 AM Medical Record Number: [id number] Patient Account Number: [id number] Date of Birth/Sex: November 04, 1940 (80 y.o. F) Treating RN: Dolan Amen Primary Care Provider: Thereasa Distance Other Clinician: Referring Provider: Thereasa Distance Treating Provider/Extender: Yaakov Guthrie in Treatment: 5 Debridement Performed for Wound #1 Right Hand - Palm Assessment: Performed By: Physician Kalman Shan, MD Debridement Type: Chemical/Enzymatic/Mechanical Agent Used: Santyl Level of Consciousness (Pre- Awake and Alert procedure): Pre-procedure Verification/Time Out Yes - 10:36 Taken: Start Time: 10:36 Instrument: Other : tongue blade Bleeding: None Response to Treatment: Procedure was tolerated well Level of Consciousness (Post- Awake and Alert procedure): Post Debridement Measurements of Total Wound Length: (cm) 1 Width: (cm) 0.4 Depth: (cm) 0.2 Volume: (cm) 0.063 Character of Wound/Ulcer Post Debridement: Stable Post Procedure Diagnosis Same as Pre-procedure Electronic Signature(s) Signed: 12/27/2020 10:45:55 AM  By: Kalman Shan DO Signed: 12/27/2020 4:33:59 PM By: Dolan Amen RN Entered By: Dolan Amen on 12/27/2020 10:37:08 Heather Peterson ([id number]) -------------------------------------------------------------------------------- HPI Details Patient Name: Heather Peterson. Date of Service: 12/27/2020 10:00 AM Medical Record Number: [id number] Patient Account Number: [id number] Date of Birth/Sex: Sep 14, 1940 (80 y.o. F) Treating RN: Dolan Amen Primary Care Provider: Thereasa Distance Other Clinician: Referring Provider: Thereasa Distance Treating Provider/Extender: Yaakov Guthrie in Treatment: 5 History of Present Illness HPI Description: Admission 7/13 Ms. Jayona Mccaig is a 80 year old female with a past medical history of type 2 diabetes on oral agents, COPD and CKD stage III that presents to the clinic for a right hand wound. She reports that 2 weeks ago she cut her hand on her walker. She visited the ED and they placed sutures. She returned to the ED 10 days later to have these removed. Unfortunately the area dehisced and at that time the skin flap had darkness along the edges. She has been using mupirocin cream on it daily. She currently denies signs of infection. 11/27/20 upon evaluation today patient appears for reevaluation here in the clinic due to issues she's been having with the laceration on the palm of her hand immediately. With that being said there is a basically crescent laceration noted here there is a portion of the flap which is actually to chronic and this is going to need to be cleaned away based on what I see. Fortunately there does not appear to be any evidence of active infection which is great news she does have quite a bit of pain however. Initially we were hoping to be able to debride some this way without having to know her but eventually did have to use lidocaine to numb the area. 7/27; patient presents for 1 week follow-up. She has been  using collagen to the wound bed daily. She has no issues or complaints today. She denies signs of infection. 8/3; patient presents for 1 week follow-up. She has been using Santyl to the wound bed Daily. She denies signs of infection. She has no  issues or complaints today. 8/17; patient presents for 2-week follow-up. She continues to use Santyl to the wound bed. She denies signs of infection. Electronic Signature(s) Signed: 12/27/2020 10:45:55 AM By: Kalman Shan DO Entered By: Kalman Shan on 12/27/2020 10:42:50 Heather Peterson ([id number]) -------------------------------------------------------------------------------- Physical Exam Details Patient Name: Heather Peterson. Date of Service: 12/27/2020 10:00 AM Medical Record Number: [id number] Patient Account Number: [id number] Date of Birth/Sex: 03/11/1941 (80 y.o. F) Treating RN: Dolan Amen Primary Care Provider: Thereasa Distance Other Clinician: Referring Provider: Thereasa Distance Treating Provider/Extender: Yaakov Guthrie in Treatment: 5 Constitutional . Psychiatric . Notes Right hand: Plantar aspect with crescent like wound with some nonviable tissue but mostly epithelialization to the wound bed. No obvious signs of infection. Electronic Signature(s) Signed: 12/27/2020 10:45:55 AM By: Kalman Shan DO Entered By: Kalman Shan on 12/27/2020 10:44:06 Heather Peterson ([id number]) -------------------------------------------------------------------------------- Physician Orders Details Patient Name: Heather Peterson. Date of Service: 12/27/2020 10:00 AM Medical Record Number: [id number] Patient Account Number: [id number] Date of Birth/Sex: 09-18-1940 (80 y.o. F) Treating RN: Dolan Amen Primary Care Provider: Thereasa Distance Other Clinician: Referring Provider: Thereasa Distance Treating Provider/Extender: Yaakov Guthrie in Treatment: 5 Verbal / Phone Orders: No Diagnosis  Coding Follow-up Appointments o Return Appointment in 2 weeks. Bathing/ Shower/ Hygiene o May shower; gently cleanse wound with antibacterial soap, rinse and pat dry prior to dressing wounds Wound Treatment Wound #1 - Hand - Palm Wound Laterality: Right Cleanser: Normal Saline 1 x Per Day/30 Days Discharge Instructions: Wash your hands with soap and water. Remove old dressing, discard into plastic bag and place into trash. Cleanse the wound with Normal Saline prior to applying a clean dressing using gauze sponges, not tissues or cotton balls. Do not scrub or use excessive force. Pat dry using gauze sponges, not tissue or cotton balls. Topical: Santyl Collagenase Ointment, 30 (gm), tube 1 x Per Day/30 Days Discharge Instructions: Apply nickel thick to wound bed only Primary Dressing: Zetuvit Plus Silicone Border Dressing 4x4 (in/in) (Generic) 1 x Per Day/30 Days Discharge Instructions: Cover wound Electronic Signature(s) Signed: 12/27/2020 10:45:55 AM By: Kalman Shan DO Signed: 12/27/2020 4:33:59 PM By: Dolan Amen RN Entered By: Dolan Amen on 12/27/2020 10:37:31 Rundell, Iwalani M. ([id number]) -------------------------------------------------------------------------------- Problem List Details Patient Name: AMARIANNA, ABPLANALP. Date of Service: 12/27/2020 10:00 AM Medical Record Number: [id number] Patient Account Number: [id number] Date of Birth/Sex: 10-06-1940 (80 y.o. F) Treating RN: Dolan Amen Primary Care Provider: Thereasa Distance Other Clinician: Referring Provider: Thereasa Distance Treating Provider/Extender: Yaakov Guthrie in Treatment: 5 Active Problems ICD-10 Encounter Code Description Active Date MDM Diagnosis S61.411D Laceration without foreign body of right hand, subsequent encounter 11/22/2020 No Yes L98.492 Non-pressure chronic ulcer of skin of other sites with fat layer exposed 11/22/2020 No Yes Inactive Problems Resolved  Problems Electronic Signature(s) Signed: 12/27/2020 10:45:55 AM By: Kalman Shan DO Entered By: Kalman Shan on 12/27/2020 10:42:10 Marek, [name] ([id number]) -------------------------------------------------------------------------------- Progress Note Details Patient Name: Heather Peterson. Date of Service: 12/27/2020 10:00 AM Medical Record Number: [id number] Patient Account Number: [id number] Date of Birth/Sex: 05-31-1940 (80 y.o. F) Treating RN: Dolan Amen Primary Care Provider: Thereasa Distance Other Clinician: Referring Provider: Thereasa Distance Treating Provider/Extender: Yaakov Guthrie in Treatment: 5 Subjective Chief Complaint Information obtained from Patient Right hand wound History of Present Illness (HPI) Admission 7/13 Ms. Lucretia Pendley is a 80 year old female with a past medical history of type 2 diabetes on oral agents, COPD and CKD stage III that presents to the  clinic for a right hand wound. She reports that 2 weeks ago she cut her hand on her walker. She visited the ED and they placed sutures. She returned to the ED 10 days later to have these removed. Unfortunately the area dehisced and at that time the skin flap had darkness along the edges. She has been using mupirocin cream on it daily. She currently denies signs of infection. 11/27/20 upon evaluation today patient appears for reevaluation here in the clinic due to issues she's been having with the laceration on the palm of her hand immediately. With that being said there is a basically crescent laceration noted here there is a portion of the flap which is actually to chronic and this is going to need to be cleaned away based on what I see. Fortunately there does not appear to be any evidence of active infection which is great news she does have quite a bit of pain however. Initially we were hoping to be able to debride some this way without having to know her but eventually did have  to use lidocaine to numb the area. 7/27; patient presents for 1 week follow-up. She has been using collagen to the wound bed daily. She has no issues or complaints today. She denies signs of infection. 8/3; patient presents for 1 week follow-up. She has been using Santyl to the wound bed Daily. She denies signs of infection. She has no issues or complaints today. 8/17; patient presents for 2-week follow-up. She continues to use Santyl to the wound bed. She denies signs of infection. Patient History Information obtained from Patient. Social History Current every day smoker, Marital Status - Married, Alcohol Use - Never, Drug Use - No History, Caffeine Use - Daily. Medical History Eyes Patient has history of Cataracts Denies history of Glaucoma, Optic Neuritis Ear/Nose/Mouth/Throat Denies history of Chronic sinus problems/congestion, Middle ear problems Hematologic/Lymphatic Patient has history of Anemia Denies history of Hemophilia, Human Immunodeficiency Virus, Lymphedema, Sickle Cell Disease Respiratory Patient has history of Chronic Obstructive Pulmonary Disease (COPD) Denies history of Aspiration, Asthma, Pneumothorax, Sleep Apnea, Tuberculosis Cardiovascular Patient has history of Congestive Heart Failure, Hypertension, Myocardial Infarction Gastrointestinal Denies history of Cirrhosis , Colitis, Crohn s, Hepatitis A, Hepatitis B, Hepatitis C Endocrine Patient has history of Type II Diabetes Denies history of Type I Diabetes Genitourinary Patient has history of End Stage Renal Disease - CKD stage 3 Immunological Denies history of Lupus Erythematosus, Raynaud s, Scleroderma Integumentary (Skin) Denies history of History of Burn, History of pressure wounds Musculoskeletal Patient has history of Gout, Osteoarthritis Neurologic Patient has history of Neuropathy Denies history of Dementia, Quadriplegia, Paraplegia, Seizure Disorder Oncologic Denies history of Received  Chemotherapy, Received Radiation Psychiatric DEVONNA, OBOYLE ([id number]) Denies history of Anorexia/bulimia, Confinement Anxiety Medical And Surgical History Notes Oncologic Multiple myeloma Objective Constitutional Vitals Time Taken: 10:14 AM, Height: 63 in, Weight: 156 lbs, BMI: 27.6, Temperature: 97.7 F, Pulse: 94 bpm, Respiratory Rate: 18 breaths/min, Blood Pressure: 87/47 mmHg. General Notes: patient stated she took 59m midodrine this AM for low BP, provider made aware of low BP in office General Notes: Right hand: Plantar aspect with crescent like wound with some nonviable tissue but mostly epithelialization to the wound bed. No obvious signs of infection. Integumentary (Hair, Skin) Wound #1 status is Open. Original cause of wound was Skin Tear/Laceration. The date acquired was: 11/08/2020. The wound has been in treatment 5 weeks. The wound is located on the Right Hand - Palm. The wound measures 1cm length  x 0.4cm width x 0.2cm depth; 0.314cm^2 area and 0.063cm^3 volume. There is Fat Layer (Subcutaneous Tissue) exposed. There is no tunneling or undermining noted. There is a medium amount of serosanguineous drainage noted. There is medium (34-66%) pink granulation within the wound bed. There is a medium (34-66%) amount of necrotic tissue within the wound bed including Adherent Slough. Assessment Active Problems ICD-10 Laceration without foreign body of right hand, subsequent encounter Non-pressure chronic ulcer of skin of other sites with fat layer exposed Patient's wound has shown improvement in size and appearance since last clinic visit. She still has some nonviable tissue and I recommended continuing Santyl daily. I will see her back in 2 weeks and I suspect this will be healed by then. Procedures Wound #1 Pre-procedure diagnosis of Wound #1 is a Dehisced Wound located on the Right Hand - Palm . There was a Chemical/Enzymatic/Mechanical debridement performed by Kalman Shan, MD. With the following instrument(s): tongue blade. Agent used was [employer]. A time out was conducted at 10:36, prior to the start of the procedure. There was no bleeding. The procedure was tolerated well. Post Debridement Measurements: 1cm length x 0.4cm width x 0.2cm depth; 0.063cm^3 volume. Character of Wound/Ulcer Post Debridement is stable. Post procedure Diagnosis Wound #1: Same as Pre-Procedure Plan Follow-up Appointments: Return Appointment in 2 weeks. IDALIA, ALLBRITTON ([id number]) Bathing/ Shower/ Hygiene: May shower; gently cleanse wound with antibacterial soap, rinse and pat dry prior to dressing wounds WOUND #1: - Hand - Palm Wound Laterality: Right Cleanser: Normal Saline 1 x Per Day/30 Days Discharge Instructions: Wash your hands with soap and water. Remove old dressing, discard into plastic bag and place into trash. Cleanse the wound with Normal Saline prior to applying a clean dressing using gauze sponges, not tissues or cotton balls. Do not scrub or use excessive force. Pat dry using gauze sponges, not tissue or cotton balls. Topical: Santyl Collagenase Ointment, 30 (gm), tube 1 x Per Day/30 Days Discharge Instructions: Apply nickel thick to wound bed only Primary Dressing: Zetuvit Plus Silicone Border Dressing 4x4 (in/in) (Generic) 1 x Per Day/30 Days Discharge Instructions: Cover wound 1. Continue Santyl 2. Follow-up in 2 weeks Electronic Signature(s) Signed: 12/27/2020 10:45:55 AM By: Kalman Shan DO Entered By: Kalman Shan on 12/27/2020 10:45:17 Heather Peterson ([id number]) -------------------------------------------------------------------------------- ROS/PFSH Details Patient Name: Heather Peterson. Date of Service: 12/27/2020 10:00 AM Medical Record Number: [id number] Patient Account Number: [id number] Date of Birth/Sex: 03/13/1941 (80 y.o. F) Treating RN: Dolan Amen Primary Care Provider: Thereasa Distance Other Clinician: Referring  Provider: Thereasa Distance Treating Provider/Extender: Yaakov Guthrie in Treatment: 5 Information Obtained From Patient Eyes Medical History: Positive for: Cataracts Negative for: Glaucoma; Optic Neuritis Ear/Nose/Mouth/Throat Medical History: Negative for: Chronic sinus problems/congestion; Middle ear problems Hematologic/Lymphatic Medical History: Positive for: Anemia Negative for: Hemophilia; Human Immunodeficiency Virus; Lymphedema; Sickle Cell Disease Respiratory Medical History: Positive for: Chronic Obstructive Pulmonary Disease (COPD) Negative for: Aspiration; Asthma; Pneumothorax; Sleep Apnea; Tuberculosis Cardiovascular Medical History: Positive for: Congestive Heart Failure; Hypertension; Myocardial Infarction Gastrointestinal Medical History: Negative for: Cirrhosis ; Colitis; Crohnos; Hepatitis A; Hepatitis B; Hepatitis C Endocrine Medical History: Positive for: Type II Diabetes Negative for: Type I Diabetes Time with diabetes: 5 years Treated with: Insulin, Diet Blood sugar tested every day: No Genitourinary Medical History: Positive for: End Stage Renal Disease - CKD stage 3 Immunological Medical History: Negative for: Lupus Erythematosus; Raynaudos; Scleroderma Integumentary (Skin) SEYMONE, FORLENZA. ([id number]) Medical History: Negative for: History of Burn; History of pressure wounds Musculoskeletal  Medical History: Positive for: Gout; Osteoarthritis Neurologic Medical History: Positive for: Neuropathy Negative for: Dementia; Quadriplegia; Paraplegia; Seizure Disorder Oncologic Medical History: Negative for: Received Chemotherapy; Received Radiation Past Medical History Notes: Multiple myeloma Psychiatric Medical History: Negative for: Anorexia/bulimia; Confinement Anxiety HBO Extended History Items Eyes: Cataracts Immunizations Pneumococcal Vaccine: Received Pneumococcal Vaccination: No Implantable Devices Yes Family and  Social History Current every day smoker; Marital Status - Married; Alcohol Use: Never; Drug Use: No History; Caffeine Use: Daily Electronic Signature(s) Signed: 12/27/2020 10:45:55 AM By: Kalman Shan DO Signed: 12/27/2020 4:33:59 PM By: Dolan Amen RN Entered By: Kalman Shan on 12/27/2020 10:42:57 PENNIE, VANBLARCOM ([id number]) -------------------------------------------------------------------------------- Olga Details Patient Name: DONASIA, WIMES. Date of Service: 12/27/2020 Medical Record Number: [id number] Patient Account Number: [id number] Date of Birth/Sex: 1940-08-10 (80 y.o. F) Treating RN: Dolan Amen Primary Care Provider: Thereasa Distance Other Clinician: Referring Provider: Thereasa Distance Treating Provider/Extender: Yaakov Guthrie in Treatment: 5 Diagnosis Coding ICD-10 Codes Code Description [phone] Laceration without foreign body of right hand, subsequent encounter L98.492 Non-pressure chronic ulcer of skin of other sites with fat layer exposed Facility Procedures CPT4 Code: [id number] Description: [phone] - DEBRIDE W/O ANES NON SELECT Modifier: Quantity: 1 CPT4 Code: Description: ICD-10 Diagnosis Description S61.411D Laceration without foreign body of right hand, subsequent encounter Modifier: Quantity: Physician Procedures CPT4 Code: [id number] Description: 19379 - WC PHYS LEVEL 3 - EST PT Modifier: Quantity: 1 CPT4 Code: Description: ICD-10 Diagnosis Description S61.411D Laceration without foreign body of right hand, subsequent encounter L98.492 Non-pressure chronic ulcer of skin of other sites with fat layer expos Modifier: ed Quantity: Electronic Signature(s) Signed: 12/27/2020 10:45:55 AM By: Kalman Shan DO Entered By: Kalman Shan on 12/27/2020 10:45:32

## 2020-12-27 NOTE — Progress Notes (Signed)
LUSIA, GREIS ([id number]) Visit Report for 12/27/2020 Arrival Information Details Patient Name: Heather Peterson, Heather Peterson. Date of Service: 12/27/2020 10:00 AM Medical Record Number: [id number] Patient Account Number: [id number] Date of Birth/Sex: 03/20/1941 (80 y.o. F) Treating RN: Dolan Amen Primary Care Kiyanna Biegler: Thereasa Distance Other Clinician: Referring Latrease Kunde: Thereasa Distance Treating Shylie Polo/Extender: Yaakov Guthrie in Treatment: 5 Visit Information History Since Last Visit Pain Present Now: No Patient Arrived: Walker Arrival Time: 10:13 Accompanied By: husband Transfer Assistance: None Patient Identification Verified: Yes Secondary Verification Process Completed: Yes Patient Has Alerts: Yes Patient Alerts: Type II Diabetic PACEMAKER Electronic Signature(s) Signed: 12/27/2020 4:33:59 PM By: Dolan Amen RN Entered By: Dolan Amen on 12/27/2020 10:14:10 Heather Peterson ([id number]) -------------------------------------------------------------------------------- Clinic Level of Care Assessment Details Patient Name: Heather Peterson. Date of Service: 12/27/2020 10:00 AM Medical Record Number: [id number] Patient Account Number: [id number] Date of Birth/Sex: Apr 18, 1941 (80 y.o. F) Treating RN: Dolan Amen Primary Care Catlyn Shipton: Thereasa Distance Other Clinician: Referring Nyala Kirchner: Thereasa Distance Treating Aubrei Bouchie/Extender: Yaakov Guthrie in Treatment: 5 Clinic Level of Care Assessment Items TOOL 1 Quantity Score []  - Use when EandM and Procedure is performed on INITIAL visit 0 ASSESSMENTS - Nursing Assessment / Reassessment []  - General Physical Exam (combine w/ comprehensive assessment (listed just below) when performed on new 0 pt. evals) []  - 0 Comprehensive Assessment (HX, ROS, Risk Assessments, Wounds Hx, etc.) ASSESSMENTS - Wound and Skin Assessment / Reassessment []  - Dermatologic / Skin Assessment (not related to wound  area) 0 ASSESSMENTS - Ostomy and/or Continence Assessment and Care []  - Incontinence Assessment and Management 0 []  - 0 Ostomy Care Assessment and Management (repouching, etc.) PROCESS - Coordination of Care []  - Simple Patient / Family Education for ongoing care 0 []  - 0 Complex (extensive) Patient / Family Education for ongoing care []  - 0 Staff obtains Programmer, systems, Records, Test Results / Process Orders []  - 0 Staff telephones HHA, Nursing Homes / Clarify orders / etc []  - 0 Routine Transfer to another Facility (non-emergent condition) []  - 0 Routine Hospital Admission (non-emergent condition) []  - 0 New Admissions / Biomedical engineer / Ordering NPWT, Apligraf, etc. []  - 0 Emergency Hospital Admission (emergent condition) PROCESS - Special Needs []  - Pediatric / Minor Patient Management 0 []  - 0 Isolation Patient Management []  - 0 Hearing / Language / Visual special needs []  - 0 Assessment of Community assistance (transportation, D/C planning, etc.) []  - 0 Additional assistance / Altered mentation []  - 0 Support Surface(s) Assessment (bed, cushion, seat, etc.) INTERVENTIONS - Miscellaneous []  - External ear exam 0 []  - 0 Patient Transfer (multiple staff / Civil Service fast streamer / Similar devices) []  - 0 Simple Staple / Suture removal (25 or less) []  - 0 Complex Staple / Suture removal (26 or more) []  - 0 Hypo/Hyperglycemic Management (do not check if billed separately) []  - 0 Ankle / Brachial Index (ABI) - do not check if billed separately Has the patient been seen at the hospital within the last three years: Yes Total Score: 0 Level Of Care: ____ Heather Peterson ([id number]) Electronic Signature(s) Signed: 12/27/2020 4:33:59 PM By: Dolan Amen RN Entered By: Dolan Amen on 12/27/2020 10:37:39 Heather Peterson ([id number]) -------------------------------------------------------------------------------- Encounter Discharge Information Details Patient  Name: Heather Peterson, Heather Peterson. Date of Service: 12/27/2020 10:00 AM Medical Record Number: [id number] Patient Account Number: [id number] Date of Birth/Sex: 03/05/1941 (80 y.o. F) Treating RN: Dolan Amen Primary Care Geremy Rister: Thereasa Distance Other Clinician: Referring Genia Perin: Thereasa Distance  Treating Tal Neer/Extender: Yaakov Guthrie in Treatment: 5 Encounter Discharge Information Items Post Procedure Vitals Discharge Condition: Stable Temperature (F): 97.7 Ambulatory Status: Walker Pulse (bpm): 94 Discharge Destination: Home Respiratory Rate (breaths/min): 18 Transportation: Private Auto Blood Pressure (mmHg): 87/47 Accompanied By: husband Schedule Follow-up Appointment: Yes Clinical Summary of Care: Electronic Signature(s) Signed: 12/27/2020 4:33:59 PM By: Dolan Amen RN Entered By: Dolan Amen on 12/27/2020 10:49:10 Heather Peterson ([id number]) -------------------------------------------------------------------------------- Lower Extremity Assessment Details Patient Name: Heather Peterson. Date of Service: 12/27/2020 10:00 AM Medical Record Number: [id number] Patient Account Number: [id number] Date of Birth/Sex: [birth year] (80 y.o. F) Treating RN: Dolan Amen Primary Care Marigene Erler: Thereasa Distance Other Clinician: Referring Nasser Ku: Thereasa Distance Treating Hosea Hanawalt/Extender: Yaakov Guthrie in Treatment: 5 Electronic Signature(s) Signed: 12/27/2020 4:33:59 PM By: Dolan Amen RN Entered By: Dolan Amen on 12/27/2020 10:27:33 Heather Peterson, Heather Peterson ([id number]) -------------------------------------------------------------------------------- Multi Wound Chart Details Patient Name: Heather Peterson, Heather Peterson. Date of Service: 12/27/2020 10:00 AM Medical Record Number: [id number] Patient Account Number: [id number] Date of Birth/Sex: November 15, 1940 (80 y.o. F) Treating RN: Dolan Amen Primary Care Koralyn Prestage: Thereasa Distance Other  Clinician: Referring Codi Folkerts: Thereasa Distance Treating Nilesh Stegall/Extender: Yaakov Guthrie in Treatment: 5 Vital Signs Height(in): 63 Pulse(bpm): 94 Weight(lbs): 156 Blood Pressure(mmHg): 87/47 Body Mass Index(BMI): 28 Temperature(F): 97.7 Respiratory Rate(breaths/min): 18 Photos: [N/A:N/A] Wound Location: Right Hand - Palm N/A N/A Wounding Event: Skin Tear/Laceration N/A N/A Primary Etiology: Dehisced Wound N/A N/A Comorbid History: Cataracts, Anemia, Chronic N/A N/A Obstructive Pulmonary Disease (COPD), Congestive Heart Failure, Hypertension, Myocardial Infarction, Type II Diabetes, End Stage Renal Disease, Gout, Osteoarthritis, Neuropathy Date Acquired: 11/08/2020 N/A N/A Weeks of Treatment: 5 N/A N/A Wound Status: Open N/A N/A Measurements L x W x D (cm) 1x0.4x0.2 N/A N/A Area (cm) : 0.314 N/A N/A Volume (cm) : 0.063 N/A N/A % Reduction in Area: 66.70% N/A N/A % Reduction in Volume: 77.70% N/A N/A Classification: Full Thickness Without Exposed N/A N/A Support Structures Exudate Amount: Medium N/A N/A Exudate Type: Serosanguineous N/A N/A Exudate Color: red, brown N/A N/A Granulation Amount: Medium (34-66%) N/A N/A Granulation Quality: Pink N/A N/A Necrotic Amount: Medium (34-66%) N/A N/A Exposed Structures: Fat Layer (Subcutaneous Tissue): N/A N/A Yes Fascia: No Tendon: No Muscle: No Joint: No Bone: No Epithelialization: Medium (34-66%) N/A N/A Debridement: Chemical/Enzymatic/Mechanical N/A N/A Pre-procedure Verification/Time 10:36 N/A N/A Out Taken: Instrument: Other(tongue blade) N/A N/A Bleeding: None N/A N/A Debridement Treatment Procedure was tolerated well N/A N/A Response: Post Debridement 1x0.4x0.2 N/A N/A Measurements L x W x D (cm) Brooker, Zuha M. ([id number]) Post Debridement Volume: 0.063 N/A N/A (cm) Procedures Performed: Debridement N/A N/A Treatment Notes Electronic Signature(s) Signed: 12/27/2020 10:45:55 AM By:  Kalman Shan DO Entered By: Kalman Shan on 12/27/2020 10:42:17 Heather Peterson ([id number]) -------------------------------------------------------------------------------- Multi-Disciplinary Care Plan Details Patient Name: Heather Peterson, Heather Peterson. Date of Service: 12/27/2020 10:00 AM Medical Record Number: [id number] Patient Account Number: [id number] Date of Birth/Sex: 16-Jun-1940 (80 y.o. F) Treating RN: Dolan Amen Primary Care Layken Doenges: Thereasa Distance Other Clinician: Referring Kincade Granberg: Thereasa Distance Treating Firman Petrow/Extender: Yaakov Guthrie in Treatment: 5 Active Inactive Wound/Skin Impairment Nursing Diagnoses: Impaired tissue integrity Goals: Patient/caregiver will verbalize understanding of skin care regimen Date Initiated: 11/22/2020 Target Resolution Date: 11/22/2020 Goal Status: Active Ulcer/skin breakdown will have a volume reduction of 30% by week 4 Date Initiated: 11/22/2020 Target Resolution Date: 12/23/2020 Goal Status: Active Ulcer/skin breakdown will have a volume reduction of 50% by week 8 Date Initiated: 11/22/2020 Target Resolution Date: 01/23/2021 Goal Status:  Active Ulcer/skin breakdown will have a volume reduction of 80% by week 12 Date Initiated: 11/22/2020 Target Resolution Date: 02/22/2021 Goal Status: Active Ulcer/skin breakdown will heal within 14 weeks Date Initiated: 11/22/2020 Target Resolution Date: 03/25/2021 Goal Status: Active Interventions: Assess patient/caregiver ability to obtain necessary supplies Assess patient/caregiver ability to perform ulcer/skin care regimen upon admission and as needed Assess ulceration(s) every visit Provide education on ulcer and skin care Treatment Activities: Referred to DME Oswin Griffith for dressing supplies : 11/22/2020 Skin care regimen initiated : 11/22/2020 Notes: Electronic Signature(s) Signed: 12/27/2020 4:33:59 PM By: Dolan Amen RN Entered By: Dolan Amen on 12/27/2020  10:48:08 Heather Peterson ([id number]) -------------------------------------------------------------------------------- Pain Assessment Details Patient Name: Heather Peterson. Date of Service: 12/27/2020 10:00 AM Medical Record Number: [id number] Patient Account Number: [id number] Date of Birth/Sex: 03/08/1941 (80 y.o. F) Treating RN: Dolan Amen Primary Care Kimesha Claxton: Thereasa Distance Other Clinician: Referring Kailan Laws: Thereasa Distance Treating Owen Pratte/Extender: Yaakov Guthrie in Treatment: 5 Active Problems Location of Pain Severity and Description of Pain Patient Has Paino No Site Locations Rate the pain. Current Pain Level: 0 Pain Management and Medication Current Pain Management: Electronic Signature(s) Signed: 12/27/2020 4:33:59 PM By: Dolan Amen RN Entered By: Dolan Amen on 12/27/2020 10:22:31 Heather Peterson ([id number]) -------------------------------------------------------------------------------- Patient/Caregiver Education Details Patient Name: Heather Peterson, Heather Peterson. Date of Service: 12/27/2020 10:00 AM Medical Record Number: [id number] Patient Account Number: [id number] Date of Birth/Gender: [birth year] (80 y.o. F) Treating RN: Dolan Amen Primary Care Physician: Thereasa Distance Other Clinician: Referring Physician: Thereasa Distance Treating Physician/Extender: Yaakov Guthrie in Treatment: 5 Education Assessment Education Provided To: Patient Education Topics Provided Wound/Skin Impairment: Methods: Explain/Verbal Responses: State content correctly Electronic Signature(s) Signed: 12/27/2020 4:33:59 PM By: Dolan Amen RN Entered By: Dolan Amen on 12/27/2020 10:38:11 Heather Peterson ([id number]) -------------------------------------------------------------------------------- Wound Assessment Details Patient Name: Heather Peterson, Heather Peterson. Date of Service: 12/27/2020 10:00 AM Medical Record Number:  [id number] Patient Account Number: [id number] Date of Birth/Sex: 1941-01-01 (80 y.o. F) Treating RN: Dolan Amen Primary Care Clayten Allcock: Thereasa Distance Other Clinician: Referring Lucila Klecka: Thereasa Distance Treating Rendy Lazard/Extender: Yaakov Guthrie in Treatment: 5 Wound Status Wound Number: 1 Primary Dehisced Wound Etiology: Wound Location: Right Hand - Palm Wound Open Wounding Event: Skin Tear/Laceration Status: Date Acquired: 11/08/2020 Comorbid Cataracts, Anemia, Chronic Obstructive Pulmonary Disease Weeks Of Treatment: 5 History: (COPD), Congestive Heart Failure, Hypertension, Clustered Wound: No Myocardial Infarction, Type II Diabetes, End Stage Renal Disease, Gout, Osteoarthritis, Neuropathy Photos Wound Measurements Length: (cm) 1 Width: (cm) 0.4 Depth: (cm) 0.2 Area: (cm) 0.314 Volume: (cm) 0.063 % Reduction in Area: 66.7% % Reduction in Volume: 77.7% Epithelialization: Medium (34-66%) Tunneling: No Undermining: No Wound Description Classification: Full Thickness Without Exposed Support Structures Exudate Amount: Medium Exudate Type: Serosanguineous Exudate Color: red, brown Foul Odor After Cleansing: No Slough/Fibrino Yes Wound Bed Granulation Amount: Medium (34-66%) Exposed Structure Granulation Quality: Pink Fascia Exposed: No Necrotic Amount: Medium (34-66%) Fat Layer (Subcutaneous Tissue) Exposed: Yes Necrotic Quality: Adherent Slough Tendon Exposed: No Muscle Exposed: No Joint Exposed: No Bone Exposed: No Treatment Notes Wound #1 (Hand - Palm) Wound Laterality: Right Cleanser Normal Saline Discharge Instruction: Wash your hands with soap and water. Remove old dressing, discard into plastic bag and place into trash. Cleanse the wound with Normal Saline prior to applying a clean dressing using gauze sponges, not tissues or cotton balls. Do not Heather Peterson, Heather Peterson ([id number]) scrub or use excessive force. Pat dry using gauze sponges,  not tissue or cotton balls.  Peri-Wound Care Topical Santyl Collagenase Ointment, 30 (gm), tube Discharge Instruction: Apply nickel thick to wound bed only Primary Dressing Zetuvit Plus Silicone Border Dressing 4x4 (in/in) Discharge Instruction: Cover wound Secondary Dressing Secured With Compression Wrap Compression Stockings Add-Ons Electronic Signature(s) Signed: 12/27/2020 4:33:59 PM By: Dolan Amen RN Entered By: Dolan Amen on 12/27/2020 10:25:56 Heather Peterson ([id number]) -------------------------------------------------------------------------------- Heather Peterson Details Patient Name: Heather Peterson. Date of Service: 12/27/2020 10:00 AM Medical Record Number: [id number] Patient Account Number: [id number] Date of Birth/Sex: [birth year] (80 y.o. F) Treating RN: Dolan Amen Primary Care Teruko Joswick: Thereasa Distance Other Clinician: Referring Anecia Nusbaum: Thereasa Distance Treating Keilin Gamboa/Extender: Yaakov Guthrie in Treatment: 5 Vital Signs Time Taken: 10:14 Temperature (F): 97.7 Height (in): 63 Pulse (bpm): 94 Weight (lbs): 156 Respiratory Rate (breaths/min): 18 Body Mass Index (BMI): 27.6 Blood Pressure (mmHg): 87/47 Reference Range: 80 - 120 mg / dl Notes patient stated she took 50m midodrine this AM for low BP, Jaylyn Booher made aware of low BP in office Electronic Signature(s) Signed: 12/27/2020 4:33:59 PM By: SDolan AmenRN Entered By: SDolan Amenon 12/27/2020 10:42:18

## 2020-12-29 ENCOUNTER — Inpatient Hospital Stay: Payer: Medicare Other

## 2020-12-29 ENCOUNTER — Other Ambulatory Visit: Payer: Self-pay | Admitting: Oncology

## 2021-01-03 ENCOUNTER — Encounter: Payer: Self-pay | Admitting: Family

## 2021-01-03 ENCOUNTER — Other Ambulatory Visit: Payer: Self-pay

## 2021-01-03 ENCOUNTER — Ambulatory Visit: Payer: Medicare Other | Attending: Family | Admitting: Family

## 2021-01-03 VITALS — BP 110/65 | HR 92 | Resp 16 | Ht 63.0 in | Wt 172.0 lb

## 2021-01-03 DIAGNOSIS — Z880 Allergy status to penicillin: Secondary | ICD-10-CM | POA: Diagnosis not present

## 2021-01-03 DIAGNOSIS — I130 Hypertensive heart and chronic kidney disease with heart failure and stage 1 through stage 4 chronic kidney disease, or unspecified chronic kidney disease: Secondary | ICD-10-CM | POA: Insufficient documentation

## 2021-01-03 DIAGNOSIS — C9000 Multiple myeloma not having achieved remission: Secondary | ICD-10-CM | POA: Insufficient documentation

## 2021-01-03 DIAGNOSIS — F419 Anxiety disorder, unspecified: Secondary | ICD-10-CM | POA: Diagnosis not present

## 2021-01-03 DIAGNOSIS — I5022 Chronic systolic (congestive) heart failure: Secondary | ICD-10-CM | POA: Diagnosis not present

## 2021-01-03 DIAGNOSIS — N184 Chronic kidney disease, stage 4 (severe): Secondary | ICD-10-CM

## 2021-01-03 DIAGNOSIS — Z955 Presence of coronary angioplasty implant and graft: Secondary | ICD-10-CM | POA: Diagnosis not present

## 2021-01-03 DIAGNOSIS — Z7952 Long term (current) use of systemic steroids: Secondary | ICD-10-CM | POA: Insufficient documentation

## 2021-01-03 DIAGNOSIS — I10 Essential (primary) hypertension: Secondary | ICD-10-CM

## 2021-01-03 DIAGNOSIS — Z794 Long term (current) use of insulin: Secondary | ICD-10-CM

## 2021-01-03 DIAGNOSIS — Z886 Allergy status to analgesic agent status: Secondary | ICD-10-CM | POA: Diagnosis not present

## 2021-01-03 DIAGNOSIS — E079 Disorder of thyroid, unspecified: Secondary | ICD-10-CM | POA: Diagnosis not present

## 2021-01-03 DIAGNOSIS — Z881 Allergy status to other antibiotic agents status: Secondary | ICD-10-CM | POA: Insufficient documentation

## 2021-01-03 DIAGNOSIS — I4581 Long QT syndrome: Secondary | ICD-10-CM | POA: Diagnosis not present

## 2021-01-03 DIAGNOSIS — E1122 Type 2 diabetes mellitus with diabetic chronic kidney disease: Secondary | ICD-10-CM | POA: Insufficient documentation

## 2021-01-03 DIAGNOSIS — F17210 Nicotine dependence, cigarettes, uncomplicated: Secondary | ICD-10-CM | POA: Insufficient documentation

## 2021-01-03 DIAGNOSIS — Z888 Allergy status to other drugs, medicaments and biological substances status: Secondary | ICD-10-CM | POA: Insufficient documentation

## 2021-01-03 DIAGNOSIS — N189 Chronic kidney disease, unspecified: Secondary | ICD-10-CM | POA: Insufficient documentation

## 2021-01-03 DIAGNOSIS — I2510 Atherosclerotic heart disease of native coronary artery without angina pectoris: Secondary | ICD-10-CM | POA: Diagnosis not present

## 2021-01-03 DIAGNOSIS — Z950 Presence of cardiac pacemaker: Secondary | ICD-10-CM | POA: Insufficient documentation

## 2021-01-03 DIAGNOSIS — F17290 Nicotine dependence, other tobacco product, uncomplicated: Secondary | ICD-10-CM | POA: Insufficient documentation

## 2021-01-03 DIAGNOSIS — F32A Depression, unspecified: Secondary | ICD-10-CM | POA: Insufficient documentation

## 2021-01-03 DIAGNOSIS — Z882 Allergy status to sulfonamides status: Secondary | ICD-10-CM | POA: Insufficient documentation

## 2021-01-03 DIAGNOSIS — Z8249 Family history of ischemic heart disease and other diseases of the circulatory system: Secondary | ICD-10-CM | POA: Insufficient documentation

## 2021-01-03 MED ORDER — SPIRONOLACTONE 25 MG PO TABS: 12.5000 mg | ORAL_TABLET | ORAL | 3 refills | Status: DC

## 2021-01-03 NOTE — Patient Instructions (Addendum)
Continue weighing daily and call for an overnight weight gain of > 2 pounds or a weekly weight gain of >5 pounds.    Begin spironolactone as 1/2 tablet every other day.

## 2021-01-03 NOTE — Progress Notes (Signed)
Patient ID: Heather Peterson, female    DOB: 1941-03-27, 80 y.o.   MRN: [id number]  HPI  Ms Robicheaux is a 80 y/o female with a history of CAD, DM, HTN, CKD, thyroid disease, anxiety, depression, prolonged QT, current tobacco use and chronic heart failure.   Echo report from 06/27/20 reviewed and showed an EF of 25% along with moderate LAE. Echo report from 04/20/2019 reviewed and showed an EF of 40% along with mild TR and moderate MR.  Catheterization done 03/11/17 showed: Ost LM lesion, 50 %stenosed. - Eccentric Heavily Calcified lesion - MLA 7.5 mm2, FFR 0.93 - Not physiologically significant. Prox LAD to Dist LAD Stented Segment, 0 %stenosed. Dist LAD lesion, 90 %stenosed. Beyond Stent - Med Rx. Prox Cx to Mid Cx Stent, 0 %stenosed. Ost 2nd Mrg to 2nd Mrg lesion, 50 %stenosed. Beyond Stent  Was in the ED 12/15/20 due to a mechanical fall resulting in hitting her head on a cabinet. Evaluated and released.   She presents today for a follow-up visit although hasn't been seen in one year. She presents with a chief complaint of moderate shortness of breath upon minimal exertion. She describes this as chronic in nature having been present for several years. She has associated fatigue, cough, pedal edema, abdominal distention, dizziness, leg weakness and difficulty sleeping along with this. She denies any chest pain or palpitations.   Says that her leg swelling and lower leg redness is improving since her torsemide was resumed ~ 1 week ago.   Past Medical History:  Diagnosis Date   Anxiety    Chronic combined systolic and diastolic CHF (congestive heart failure) (HCC)    Chronic kidney disease    Coronary artery disease    Depression    Diabetes mellitus without complication (HCC)    Diabetes mellitus, type II ([place])    Hypertension    MI (myocardial infarction) ([place])    x 5   Pacemaker    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HCC)    Prolonged Q-T interval on ECG    Thyroid disease    Past  Surgical History:  Procedure Laterality Date   CENTRAL LINE INSERTION  03/11/2017   Procedure: CENTRAL LINE INSERTION;  Surgeon: Leonie Man, MD;  Location: [facility];  Service: Cardiovascular;;   CHOLECYSTECTOMY     COLONOSCOPY WITH PROPOFOL N/A 09/01/2019   Procedure: COLONOSCOPY WITH PROPOFOL;  Surgeon: Toledo, Benay Pike, MD;  Location: [facility];  Service: Gastroenterology;  Laterality: N/A;   CORONARY STENT INTERVENTION W/IMPELLA N/A 03/11/2017   Procedure: Coronary Stent Intervention w/Impella;  Surgeon: Leonie Man, MD;  Location: [place] [facility];  Service: Cardiovascular;  Laterality: N/A;   ESOPHAGOGASTRODUODENOSCOPY (EGD) WITH PROPOFOL N/A 09/01/2019   Procedure: ESOPHAGOGASTRODUODENOSCOPY (EGD) WITH PROPOFOL;  Surgeon: Toledo, Benay Pike, MD;  Location: [facility];  Service: Gastroenterology;  Laterality: N/A;   ESOPHAGOGASTRODUODENOSCOPY (EGD) WITH PROPOFOL N/A 09/08/2019   Procedure: ESOPHAGOGASTRODUODENOSCOPY (EGD) WITH PROPOFOL;  Surgeon: [name], MD;  Location: [facility];  Service: Gastroenterology;  Laterality: N/A;   EYE SURGERY     HIP ARTHROPLASTY Right 03/29/2020   Procedure: ARTHROPLASTY BIPOLAR HIP (HEMIARTHROPLASTY);  Surgeon: Corky Mull, MD;  Location: [facility];  Service: Orthopedics;  Laterality: Right;   INTRAVASCULAR PRESSURE WIRE/FFR STUDY N/A 03/11/2017   Procedure: INTRAVASCULAR PRESSURE WIRE/FFR STUDY;  Surgeon: Leonie Man, MD;  Location: [place] [facility];  Service: Cardiovascular;  Laterality: N/A;   INTRAVASCULAR ULTRASOUND/IVUS N/A 03/11/2017   Procedure:  Intravascular Ultrasound/IVUS;  Surgeon: Leonie Man, MD;  Location: [facility];  Service: Cardiovascular;  Laterality: N/A;   LEFT HEART CATH AND CORONARY ANGIOGRAPHY N/A 03/05/2017   Procedure: LEFT HEART CATH AND CORONARY ANGIOGRAPHY;  Surgeon: Isaias Cowman, MD;  Location: [facility];  Service: Cardiovascular;  Laterality: N/A;    PACEMAKER IMPLANT     pacemaker/defibrillator Left    Family History  Problem Relation Age of Onset   Hypertension Father    Heart attack Father    Depression Sister    Depression Brother    Depression Brother    Social History   Tobacco Use   Smoking status: Every Day    Packs/day: 0.25    Types: E-cigarettes, Cigarettes   Smokeless tobacco: Never  Substance Use Topics   Alcohol use: Not Currently    Comment: occasionally   Allergies  Allergen Reactions   Celebrex [Celecoxib] Anaphylaxis   Glipizide Anaphylaxis   Levaquin [Levofloxacin In D5w] Other (See Comments)    Heart arrhthymias   Levofloxacin Other (See Comments) and Palpitations    ICD fired   Lisinopril Swelling    Lip and facial swelling   Sulfa Antibiotics Other (See Comments) and Anaphylaxis    Reaction: unknown   Metformin Other (See Comments)    Gi tolerance    Penicillins Rash and Other (See Comments)    Has patient had a PCN reaction causing immediate rash, facial/tongue/throat swelling, SOB or lightheadedness with hypotension: Unknown Has patient had a PCN reaction causing severe rash involving mucus membranes or skin necrosis: No Has patient had a PCN reaction that required hospitalization: No Has patient had a PCN reaction occurring within the last 10 years: No If all of the above answers are "NO", then may proceed with Cephalosporin use.    Prior to Admission medications   Medication Sig Start Date End Date Taking? Authorizing Provider  allopurinol (ZYLOPRIM) 100 MG tablet Take 50 mg by mouth daily.   Yes [provider]  aspirin EC 81 MG tablet Take 81 mg by mouth at bedtime.    Yes [provider]  dexamethasone (DECADRON) 4 MG tablet Take 5 tablets (20 mg total) by mouth daily. Take the day after Velcade on days 2,5,9,12. Take with breakfast 11/14/20  Yes Lloyd Huger, MD  docusate sodium (COLACE) 100 MG capsule Take 100 mg by mouth 2 (two) times daily.   Yes [provider]  Ensure Max Protein (ENSURE MAX PROTEIN) LIQD Take 330 mLs (11 oz total) by mouth 2 (two) times daily between meals. 04/03/20  Yes [name], MD  fexofenadine (ALLEGRA) 180 MG tablet Take 180 mg by mouth daily.   Yes [provider]  FLUoxetine (PROZAC) 10 MG capsule Take 10 mg by mouth daily. 06/29/19  Yes [provider]  gabapentin (NEURONTIN) 100 MG capsule TAKE 1 CAPSULE(100 MG) BY MOUTH THREE TIMES DAILY 12/29/20  Yes Lloyd Huger, MD  gabapentin (NEURONTIN) 300 MG capsule Take 391m in AM, 3021min afternoon, 90063mt night Patient taking differently: Patient takes 200m25m the morning and 900mg66mnight. 10/06/20  Yes Vaslow, ZachaAcey Lav gabapentin (NEURONTIN) 600 MG tablet Take 600 mg by mouth daily. Patient takes 200mg 49mhe morning and 900mg a74mght. 11/07/20  Yes [provider]  hydrocortisone (ANUSOL-HC) 2.5 % rectal cream Apply 1 application topically 2 (two) times daily. 02/12/20  Yes [provider]  Lactulose 20 GM/30ML SOLN Take 30 mLs (  20 g total) by mouth in the morning and at bedtime. 06/23/20  Yes Jacquelin Hawking, NP  levothyroxine (SYNTHROID) 50 MCG tablet Take by mouth. 08/14/20  Yes [provider]  levothyroxine (SYNTHROID) 75 MCG tablet Take 1 tablet (75 mcg total) by mouth daily before breakfast. On Monday, Wednesday and Friday 04/03/20  Yes [name], MD  magnesium oxide (MAG-OX) 400 MG tablet Take 200 mg by mouth daily.   Yes [provider]  mexiletine (MEXITIL) 200 MG capsule Take 1 capsule (200 mg total) by mouth every 12 (twelve) hours. 03/14/17  Yes Seiler, Amber K, NP  midodrine (PROAMATINE) 10 MG tablet Take 1 tablet (10 mg total) by mouth 2 (two) times daily with a meal. 04/03/20  Yes [name], MD  OXYGEN Inhale 2 L into the lungs continuous.   Yes [provider]  pantoprazole (PROTONIX) 40 MG tablet Take 40 mg by mouth daily.  05/16/19  Yes [provider]   polyethylene glycol (MIRALAX) 17 g packet Take 17 g by mouth daily. 06/18/20  Yes Nance Pear, MD  Prenatal Vit-Fe Fumarate-FA (PRENATAL MULTIVITAMIN) TABS tablet Take 1 tablet by mouth daily.   Yes [provider]  Probiotic Product (PROBIOTIC DAILY PO) Take 1 tablet by mouth daily.   Yes [provider]  simvastatin (ZOCOR) 40 MG tablet Take 20 mg by mouth at bedtime.    Yes [provider]  torsemide (DEMADEX) 20 MG tablet Take 20 mg by mouth daily.   Yes [provider]  traZODone (DESYREL) 50 MG tablet Take 50 mg by mouth at bedtime.   Yes [provider]  zolpidem (AMBIEN) 5 MG tablet Take 1 tablet (5 mg total) by mouth at bedtime as needed for sleep. 11/22/20  Yes Verlon Au, NP     Review of Systems  Constitutional:  Positive for fatigue. Negative for appetite change.  HENT:  Positive for congestion. Negative for postnasal drip and sore throat.   Eyes: Negative.   Respiratory:  Positive for cough and shortness of breath (easily).   Cardiovascular:  Positive for leg swelling (decreasing). Negative for chest pain and palpitations.  Gastrointestinal:  Positive for abdominal distention. Negative for abdominal pain.  Endocrine: Negative.   Genitourinary: Negative.   Musculoskeletal:  Positive for arthralgias and back pain.  Skin: Negative.   Allergic/Immunologic: Negative.   Neurological:  Positive for dizziness (at times) and weakness (leg). Negative for light-headedness.  Hematological:  Negative for adenopathy. Does not bruise/bleed easily.  Psychiatric/Behavioral:  Positive for sleep disturbance (wearing oxygen at 2L when asleep). Negative for dysphoric mood. The patient is not nervous/anxious.    Vitals:   01/03/21 1105  BP: 110/65  Pulse: 92  Resp: 16  SpO2: 100%  Weight: 172 lb (78 kg)  Height: 5' 3"  (1.6 m)   Wt Readings from Last 3 Encounters:  01/03/21 172 lb (78 kg)  12/26/20 175 lb 11.2 oz (79.7 kg)  12/15/20  159 lb (72.1 kg)   Lab Results  Component Value Date   CREATININE 1.66 (H) 12/26/2020   CREATININE 1.89 (H) 12/22/2020   CREATININE 1.55 (H) 12/20/2020   Physical Exam Vitals and nursing note reviewed.  Constitutional:      Appearance: She is well-developed.  HENT:     Head: Normocephalic and atraumatic.  Neck:     Vascular: No JVD.  Cardiovascular:     Rate and Rhythm: Normal rate and regular rhythm.  Pulmonary:     Effort: Pulmonary effort  is normal. No respiratory distress.     Breath sounds: No wheezing or rales.  Abdominal:     General: There is no distension.     Palpations: Abdomen is soft.     Tenderness: There is no abdominal tenderness.  Musculoskeletal:     Cervical back: Normal range of motion and neck supple.     Right lower leg: No tenderness. Edema (1+ pitting) present.     Left lower leg: No tenderness. Edema (1+ pitting) present.  Skin:    General: Skin is warm and dry.     Findings: Erythema (both lower legs) present.  Neurological:     General: No focal deficit present.     Mental Status: She is alert and oriented to person, place, and time.  Psychiatric:        Mood and Affect: Mood normal.        Behavior: Behavior normal.   Assessment & Plan:  1: Chronic heart failure with reduced ejection fraction- - NYHA class III - weighing daily & says that she's gained ~ 14 pounds at home in the last month; reminded her to call for an overnight weight gain of >2 pounds or a weekly weight gain of >5 pounds - says that her torsemide was resumed ~ 1 week ago - weight unchanged from last visit here 1 year ago - not adding salt and she was encouraged to read food labels for sodium content carefully - saw cardiology Margarito Courser) 11/29/20 - has ICD present & it has fired in the past - BNP 03/28/20 was 318.3 - angioedema with lisinopril - will begin spironolactone 12.29m QOD with BMP to be drawn next week; will message oncologist to draw BMP next week - currently  getting weekly PT at home  2: HTN- - BP looks good today (110/65) - when SBP is <100, she takes midodrine - saw PCP (Feldpausch) 11/27/20 - BMP 12/26/20 reviewed and showed sodium 138, potassium 4.1, creatinine 1.66 and GFR 31  3: DM- - saw endocrinology (Pasty Arch 12/14/19 - A1c 06/18/20 was 6.0% - saw nephrology (Kolluru) 10/27/19; returns next week  4: Multiple myeloma- - Hemoglobin 12/26/20 was 8.4 - saw GI (Jacqulyn Liner 09/15/19 - saw oncology (Grayland Ormond 12/26/20   Patient did not bring her medications nor a list. Each medication was verbally reviewed with the patient and she was encouraged to bring the bottles to every visit to confirm accuracy of list.  Return in 1 month or sooner for any questions/problems. Will check BMP at that visit as well.

## 2021-01-04 NOTE — Progress Notes (Signed)
[facility]  [phone]) [phone] Fax:(336) [phone]  ID: Heather Peterson OB: May 10, 1941  MR#: [id number]  DXI#:[id number]  [name], MD as PCP - General (Family Medicine) Lloyd Huger, MD as Consulting Physician (Oncology)  CHIEF COMPLAINT: Multiple myeloma, subdural hematoma.  INTERVAL HISTORY: Patient returns to clinic today for further evaluation and reconsideration of cycle 6, day 1 of Velcade.  Her peripheral edema has slowly improved since being evaluated in CHF clinic and starting spironolactone.  She continues to have chronic weakness and fatigue, but this is also improved.  She does not complain of any neuropathy today.  She has no neurologic complaints.  She has chronic shortness of breath and requires oxygen 24 hours/day.  She denies any fevers.  She has a fair appetite.  She denies any chest pain, cough, or hemoptysis. She denies any nausea, vomiting, constipation or diarrhea. She has no melena or hematochezia.  She has no urinary complaints.  Patient offers no further specific complaints today.  REVIEW OF SYSTEMS:   Review of Systems  Constitutional:  Positive for malaise/fatigue. Negative for fever.  Respiratory:  Positive for shortness of breath. Negative for cough and hemoptysis.   Cardiovascular:  Positive for leg swelling. Negative for chest pain.  Gastrointestinal: Negative.  Negative for abdominal pain, diarrhea, melena and nausea.  Genitourinary: Negative.  Negative for dysuria and hematuria.  Musculoskeletal:  Positive for back pain. Negative for joint pain.  Skin: Negative.  Negative for rash.  Neurological:  Positive for weakness. Negative for dizziness, sensory change, focal weakness and headaches.  Psychiatric/Behavioral: Negative.  The patient is not nervous/anxious.    As per HPI. Otherwise, a complete review of systems is negative.  PAST MEDICAL HISTORY: Past Medical History:  Diagnosis Date    Anxiety    Chronic combined systolic and diastolic CHF (congestive heart failure) (HCC)    Chronic kidney disease    Coronary artery disease    Depression    Diabetes mellitus without complication (HCC)    Diabetes mellitus, type II ([place])    Hypertension    MI (myocardial infarction) ([place])    x 5   Pacemaker    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[place])    Prolonged Q-T interval on ECG    Thyroid disease     PAST SURGICAL HISTORY: Past Surgical History:  Procedure Laterality Date   CENTRAL LINE INSERTION  03/11/2017   Procedure: CENTRAL LINE INSERTION;  Surgeon: Leonie Man, MD;  Location: [facility];  Service: Cardiovascular;;   CHOLECYSTECTOMY     COLONOSCOPY WITH PROPOFOL N/A 09/01/2019   Procedure: COLONOSCOPY WITH PROPOFOL;  Surgeon: Toledo, Benay Pike, MD;  Location: [facility];  Service: Gastroenterology;  Laterality: N/A;   CORONARY STENT INTERVENTION W/IMPELLA N/A 03/11/2017   Procedure: Coronary Stent Intervention w/Impella;  Surgeon: Leonie Man, MD;  Location: [facility];  Service: Cardiovascular;  Laterality: N/A;   ESOPHAGOGASTRODUODENOSCOPY (EGD) WITH PROPOFOL N/A 09/01/2019   Procedure: ESOPHAGOGASTRODUODENOSCOPY (EGD) WITH PROPOFOL;  Surgeon: Toledo, Benay Pike, MD;  Location: [facility];  Service: Gastroenterology;  Laterality: N/A;   ESOPHAGOGASTRODUODENOSCOPY (EGD) WITH PROPOFOL N/A 09/08/2019   Procedure: ESOPHAGOGASTRODUODENOSCOPY (EGD) WITH PROPOFOL;  Surgeon: [name], MD;  Location: [facility]COPY;  Service: Gastroenterology;  Laterality: N/A;   EYE SURGERY     HIP ARTHROPLASTY Right 03/29/2020   Procedure: ARTHROPLASTY BIPOLAR HIP (HEMIARTHROPLASTY);  Surgeon: Corky Mull, MD;  Location: [facility];  Service: Orthopedics;  Laterality: Right;  INTRAVASCULAR PRESSURE WIRE/FFR STUDY N/A 03/11/2017   Procedure: INTRAVASCULAR PRESSURE WIRE/FFR STUDY;  Surgeon: Leonie Man, MD;  Location: [facility];  Service:  Cardiovascular;  Laterality: N/A;   INTRAVASCULAR ULTRASOUND/IVUS N/A 03/11/2017   Procedure: Intravascular Ultrasound/IVUS;  Surgeon: Leonie Man, MD;  Location: [facility];  Service: Cardiovascular;  Laterality: N/A;   LEFT HEART CATH AND CORONARY ANGIOGRAPHY N/A 03/05/2017   Procedure: LEFT HEART CATH AND CORONARY ANGIOGRAPHY;  Surgeon: Isaias Cowman, MD;  Location: [facility];  Service: Cardiovascular;  Laterality: N/A;   PACEMAKER IMPLANT     pacemaker/defibrillator Left     FAMILY HISTORY: Family History  Problem Relation Age of Onset   Hypertension Father    Heart attack Father    Depression Sister    Depression Brother    Depression Brother     ADVANCED DIRECTIVES (Y/N):  N  HEALTH MAINTENANCE: Social History   Tobacco Use   Smoking status: Every Day    Packs/day: 0.25    Types: E-cigarettes, Cigarettes   Smokeless tobacco: Never  Vaping Use   Vaping Use: Former  Substance Use Topics   Alcohol use: Not Currently    Comment: occasionally   Drug use: Yes    Comment: prescribed pain meds     Colonoscopy:  PAP:  Bone density:  Lipid panel:  Allergies  Allergen Reactions   Celebrex [Celecoxib] Anaphylaxis   Glipizide Anaphylaxis   Levaquin [Levofloxacin In D5w] Other (See Comments)    Heart arrhthymias   Levofloxacin Other (See Comments) and Palpitations    ICD fired   Lisinopril Swelling    Lip and facial swelling   Sulfa Antibiotics Other (See Comments) and Anaphylaxis    Reaction: unknown   Metformin Other (See Comments)    Gi tolerance    Penicillins Rash and Other (See Comments)    Has patient had a PCN reaction causing immediate rash, facial/tongue/throat swelling, SOB or lightheadedness with hypotension: Unknown Has patient had a PCN reaction causing severe rash involving mucus membranes or skin necrosis: No Has patient had a PCN reaction that required hospitalization: No Has patient had a PCN reaction occurring  within the last 10 years: No If all of the above answers are "NO", then may proceed with Cephalosporin use.     Current Outpatient Medications  Medication Sig Dispense Refill   allopurinol (ZYLOPRIM) 100 MG tablet Take 50 mg by mouth daily.     aspirin EC 81 MG tablet Take 81 mg by mouth at bedtime.      dexamethasone (DECADRON) 4 MG tablet Take 5 tablets (20 mg total) by mouth daily. Take the day after Velcade on days 2,5,9,12. Take with breakfast 40 tablet 3   docusate sodium (COLACE) 100 MG capsule Take 100 mg by mouth 2 (two) times daily.     Ensure Max Protein (ENSURE MAX PROTEIN) LIQD Take 330 mLs (11 oz total) by mouth 2 (two) times daily between meals.     fexofenadine (ALLEGRA) 180 MG tablet Take 180 mg by mouth daily.     FLUoxetine (PROZAC) 10 MG capsule Take 10 mg by mouth daily.     gabapentin (NEURONTIN) 100 MG capsule TAKE 1 CAPSULE(100 MG) BY MOUTH THREE TIMES DAILY 270 capsule 1   gabapentin (NEURONTIN) 300 MG capsule Take 310m in AM, 3066min afternoon, 90022mt night (Patient taking differently: Patient takes 200m19m the morning and 900mg64mnight.) 150 capsule 3   gabapentin (NEURONTIN) 600 MG  tablet Take 600 mg by mouth daily. Patient takes 264m in the morning and 9011mat night.     hydrocortisone (ANUSOL-HC) 2.5 % rectal cream Apply 1 application topically 2 (two) times daily.     Lactulose 20 GM/30ML SOLN Take 30 mLs (20 g total) by mouth in the morning and at bedtime. 450 mL 1   levothyroxine (SYNTHROID) 50 MCG tablet Take by mouth.     levothyroxine (SYNTHROID) 75 MCG tablet Take 1 tablet (75 mcg total) by mouth daily before breakfast. On Monday, Wednesday and Friday     magnesium oxide (MAG-OX) 400 MG tablet Take 200 mg by mouth daily.     mexiletine (MEXITIL) 200 MG capsule Take 1 capsule (200 mg total) by mouth every 12 (twelve) hours. 60 capsule 1   midodrine (PROAMATINE) 10 MG tablet Take 1 tablet (10 mg total) by mouth 2 (two) times daily with a meal.      OXYGEN Inhale 2 L into the lungs continuous.     pantoprazole (PROTONIX) 40 MG tablet Take 40 mg by mouth daily.      polyethylene glycol (MIRALAX) 17 g packet Take 17 g by mouth daily. 14 each 0   potassium chloride SA (KLOR-CON) 20 MEQ tablet Take 20 mEq by mouth daily.     Prenatal Vit-Fe Fumarate-FA (PRENATAL MULTIVITAMIN) TABS tablet Take 1 tablet by mouth daily.     Probiotic Product (PROBIOTIC DAILY PO) Take 1 tablet by mouth daily.     SANTYL ointment Apply topically.     simvastatin (ZOCOR) 40 MG tablet Take 20 mg by mouth at bedtime.   2   spironolactone (ALDACTONE) 25 MG tablet Take 0.5 tablets (12.5 mg total) by mouth every other day. 15 tablet 3   torsemide (DEMADEX) 20 MG tablet Take 40 mg by mouth daily.     traZODone (DESYREL) 50 MG tablet Take 50 mg by mouth at bedtime.     zolpidem (AMBIEN) 5 MG tablet Take 1 tablet (5 mg total) by mouth at bedtime as needed for sleep. (Patient not taking: Reported on 01/09/2021) 30 tablet 0   No current facility-administered medications for this visit.    OBJECTIVE: Vitals:   01/09/21 0922  BP: 109/76  Pulse: 92  Resp: 16  Temp: (!) 96.1 F (35.6 C)     Body mass index is 30.29 kg/m.    ECOG FS:1 - Symptomatic but completely ambulatory  General: Well-developed, well-nourished, no acute distress. Eyes: Pink conjunctiva, anicteric sclera. HEENT: Normocephalic, moist mucous membranes. Lungs: No audible wheezing or coughing. Heart: Regular rate and rhythm. Abdomen: Soft, nontender, no obvious distention. Musculoskeletal: 1-2+ bilateral lower extremity edema Neuro: Alert, answering all questions appropriately. Cranial nerves grossly intact. Skin: No rashes or petechiae noted. Psych: Normal affect.   LAB RESULTS:  Lab Results  Component Value Date   NA 137 01/09/2021   K 3.8 01/09/2021   CL 98 01/09/2021   CO2 29 01/09/2021   GLUCOSE 141 (H) 01/09/2021   BUN 39 (H) 01/09/2021   CREATININE 1.81 (H) 01/09/2021   CALCIUM 8.8  (L) 01/09/2021   PROT 6.9 01/09/2021   ALBUMIN 3.9 01/09/2021   AST 14 (L) 01/09/2021   ALT 10 01/09/2021   ALKPHOS 108 01/09/2021   BILITOT 0.6 01/09/2021   GFRNONAA 28 (L) 01/09/2021   GFRAA 30 (L) 09/09/2019    Lab Results  Component Value Date   WBC 8.3 01/09/2021   NEUTROABS 6.6 01/09/2021   HGB 7.8 (L) 01/09/2021  HCT 26.4 (L) 01/09/2021   MCV 91.3 01/09/2021   PLT 205 01/09/2021   Lab Results  Component Value Date   IRON 85 05/23/2020   TIBC 421 05/23/2020   IRONPCTSAT 20 05/23/2020   Lab Results  Component Value Date   FERRITIN 50 05/23/2020     STUDIES: CT HEAD WO CONTRAST (5MM)  Result Date: 12/15/2020 CLINICAL DATA:  Head trauma, minor (Age >= 65y); Neck trauma (Age >= 65y) EXAM: CT HEAD WITHOUT CONTRAST CT CERVICAL SPINE WITHOUT CONTRAST TECHNIQUE: Multidetector CT imaging of the head and cervical spine was performed following the standard protocol without intravenous contrast. Multiplanar CT image reconstructions of the cervical spine were also generated. COMPARISON:  CT cervical spine November 16 21. CT head June 25, 2020. FINDINGS: CT HEAD FINDINGS Brain: No evidence of acute large vascular territory infarction, hemorrhage, hydrocephalus, extra-axial collection or mass lesion/mass effect. Remote right frontal infarct with encephalomalacia. Patchy white matter hypoattenuation, compatible with chronic microvascular ischemic disease. Mild atrophy with ex vacuo ventricular dilation. Partially empty sella. Vascular: No hyperdense vessel identified. Calcific atherosclerosis. Skull: Left frontal scalp contusion no acute fracture Sinuses/Orbits: Visualized sinuses are clear. Other: No mastoid effusions. CT CERVICAL SPINE FINDINGS Alignment: Similar mild reversal of the normal cervical lordosis. Similar mild anterolisthesis of C3 on C4 and C4 on C5, likely degenerative given stability and facet arthropathy at these levels. Skull base and vertebrae: No evidence of acute  fracture. Osteopenia. Soft tissues and spinal canal: No prevertebral fluid or swelling. No visible canal hematoma. Disc levels: Mild multilevel degenerative disc disease. At least moderate multilevel facet arthropathy, greatest in the upper cervical spine. Upper chest: Visualized lung apices are clear. Other: Calcific atherosclerosis of the carotids with retropharyngeal course. IMPRESSION: CT head: 1. No evidence of acute intracranial abnormality. 2. Left frontal scalp contusion without acute fracture. 3. Remote right frontal infarct with encephalomalacia. 4. Chronic microvascular ischemic disease. CT cervical spine: 1. No evidence of acute fracture or traumatic malalignment 2. Similar degenerative change and subluxation. Electronically Signed   By: Margaretha Sheffield MD   On: 12/15/2020 16:52   CT Cervical Spine Wo Contrast  Result Date: 12/15/2020 CLINICAL DATA:  Head trauma, minor (Age >= 65y); Neck trauma (Age >= 65y) EXAM: CT HEAD WITHOUT CONTRAST CT CERVICAL SPINE WITHOUT CONTRAST TECHNIQUE: Multidetector CT imaging of the head and cervical spine was performed following the standard protocol without intravenous contrast. Multiplanar CT image reconstructions of the cervical spine were also generated. COMPARISON:  CT cervical spine November 16 21. CT head June 25, 2020. FINDINGS: CT HEAD FINDINGS Brain: No evidence of acute large vascular territory infarction, hemorrhage, hydrocephalus, extra-axial collection or mass lesion/mass effect. Remote right frontal infarct with encephalomalacia. Patchy white matter hypoattenuation, compatible with chronic microvascular ischemic disease. Mild atrophy with ex vacuo ventricular dilation. Partially empty sella. Vascular: No hyperdense vessel identified. Calcific atherosclerosis. Skull: Left frontal scalp contusion no acute fracture Sinuses/Orbits: Visualized sinuses are clear. Other: No mastoid effusions. CT CERVICAL SPINE FINDINGS Alignment: Similar mild reversal of  the normal cervical lordosis. Similar mild anterolisthesis of C3 on C4 and C4 on C5, likely degenerative given stability and facet arthropathy at these levels. Skull base and vertebrae: No evidence of acute fracture. Osteopenia. Soft tissues and spinal canal: No prevertebral fluid or swelling. No visible canal hematoma. Disc levels: Mild multilevel degenerative disc disease. At least moderate multilevel facet arthropathy, greatest in the upper cervical spine. Upper chest: Visualized lung apices are clear. Other: Calcific atherosclerosis of the carotids with retropharyngeal  course. IMPRESSION: CT head: 1. No evidence of acute intracranial abnormality. 2. Left frontal scalp contusion without acute fracture. 3. Remote right frontal infarct with encephalomalacia. 4. Chronic microvascular ischemic disease. CT cervical spine: 1. No evidence of acute fracture or traumatic malalignment 2. Similar degenerative change and subluxation. Electronically Signed   By: Margaretha Sheffield MD   On: 12/15/2020 16:52    ASSESSMENT: Multiple myeloma, fractured right hip.  PLAN:    1.  Multiple myeloma: Bone marrow biopsy confirmed diagnosis with plasma cells up to 90% of biopsy.  Cytogenetics are reported as normal.  Metastatic bone survey from March 03, 2020 reviewed independently with multiple skeletal lucencies consistent with myeloma.  Patient's most recent M spike was reported at 0.4 with an IgA component of 611.  Kappa and lambda free light chains continue to be within normal limits. Initial plan was to give Velcade on days 1, 4, 8, and 11 along with 10 mg Revlimid on days 1 through 14. Patient will also receive weekly 20 mg dexamethasone.  Revlimid was discontinued secondary to intolerance.  Patient did not receive treatment between June 20, 2020 and November 14, 2020.  Continue to hold treatment.  Return to clinic in 3 weeks for further evaluation and consideration of reinitiating treatment with Velcade only.  2.  Anemia:  Hemoglobin has trended down to 7.8. Likely multifactorial with chronic renal insufficiency, underlying myeloma, as well as history of recent GI bleed.  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  Hold transfusion today. 3.  Chronic renal insufficiency: Creatinine has trended up slightly to 1.81.  Monitor. 4.  Ascites/peripheral edema: Improving.  Continue follow-up with heart failure clinic. 5.  Angiodysplastic lesions: Continue follow-up with GI as scheduled. 6. Constipation: Patient does not complain of this today. Continue MiraLAX and lactulose as needed. 7.  Right hip fracture: Continue rehab and follow-up with orthopedics as scheduled. Continue Percocet as needed. 8. Thrombocytopenia: Resolved. 9.  Peripheral neuropathy: Continue 700 mg gabapentin at night in addition to 100 mg in the morning and in the afternoon.    Patient expressed understanding and was in agreement with this plan. She also understands that She can call clinic at any time with any questions, concerns, or complaints.    Lloyd Huger, MD   01/09/2021 10:24 AM

## 2021-01-09 ENCOUNTER — Encounter: Payer: Self-pay | Admitting: Oncology

## 2021-01-09 ENCOUNTER — Other Ambulatory Visit: Payer: Self-pay

## 2021-01-09 ENCOUNTER — Inpatient Hospital Stay: Payer: Medicare Other

## 2021-01-09 ENCOUNTER — Inpatient Hospital Stay (HOSPITAL_BASED_OUTPATIENT_CLINIC_OR_DEPARTMENT_OTHER): Payer: Medicare Other | Admitting: Oncology

## 2021-01-09 VITALS — BP 109/76 | HR 92 | Temp 96.1°F | Resp 16 | Wt 171.0 lb

## 2021-01-09 DIAGNOSIS — C9000 Multiple myeloma not having achieved remission: Secondary | ICD-10-CM

## 2021-01-09 DIAGNOSIS — Z5111 Encounter for antineoplastic chemotherapy: Secondary | ICD-10-CM | POA: Diagnosis not present

## 2021-01-09 LAB — CBC WITH DIFFERENTIAL/PLATELET
Abs Immature Granulocytes: 0.14 10*3/uL — ABNORMAL HIGH (ref 0.00–0.07)
Basophils Absolute: 0 10*3/uL (ref 0.0–0.1)
Basophils Relative: 1 %
Eosinophils Absolute: 0 10*3/uL (ref 0.0–0.5)
Eosinophils Relative: 0 %
HCT: 26.4 % — ABNORMAL LOW (ref 36.0–46.0)
Hemoglobin: 7.8 g/dL — ABNORMAL LOW (ref 12.0–15.0)
Immature Granulocytes: 2 %
Lymphocytes Relative: 10 %
Lymphs Abs: 0.8 10*3/uL (ref 0.7–4.0)
MCH: 27 pg (ref 26.0–34.0)
MCHC: 29.5 g/dL — ABNORMAL LOW (ref 30.0–36.0)
MCV: 91.3 fL (ref 80.0–100.0)
Monocytes Absolute: 0.7 10*3/uL (ref 0.1–1.0)
Monocytes Relative: 9 %
Neutro Abs: 6.6 10*3/uL (ref 1.7–7.7)
Neutrophils Relative %: 78 %
Platelets: 205 10*3/uL (ref 150–400)
RBC: 2.89 MIL/uL — ABNORMAL LOW (ref 3.87–5.11)
RDW: 20.6 % — ABNORMAL HIGH (ref 11.5–15.5)
WBC: 8.3 10*3/uL (ref 4.0–10.5)
nRBC: 0.2 % (ref 0.0–0.2)

## 2021-01-09 LAB — COMPREHENSIVE METABOLIC PANEL
ALT: 10 U/L (ref 0–44)
AST: 14 U/L — ABNORMAL LOW (ref 15–41)
Albumin: 3.9 g/dL (ref 3.5–5.0)
Alkaline Phosphatase: 108 U/L (ref 38–126)
Anion gap: 10 (ref 5–15)
BUN: 39 mg/dL — ABNORMAL HIGH (ref 8–23)
CO2: 29 mmol/L (ref 22–32)
Calcium: 8.8 mg/dL — ABNORMAL LOW (ref 8.9–10.3)
Chloride: 98 mmol/L (ref 98–111)
Creatinine, Ser: 1.81 mg/dL — ABNORMAL HIGH (ref 0.44–1.00)
GFR, Estimated: 28 mL/min — ABNORMAL LOW (ref >60)
Glucose, Bld: 141 mg/dL — ABNORMAL HIGH (ref 70–99)
Potassium: 3.8 mmol/L (ref 3.5–5.1)
Sodium: 137 mmol/L (ref 135–145)
Total Bilirubin: 0.6 mg/dL (ref 0.3–1.2)
Total Protein: 6.9 g/dL (ref 6.5–8.1)

## 2021-01-09 LAB — SAMPLE TO BLOOD BANK

## 2021-01-09 NOTE — Progress Notes (Signed)
Patient had x-ray at [facility] that showed a rotator cuff tear and was given a steroid injection.  Also had right hip xray that showed a small fracture. Patient has been seen at CHF clinic and Spironlactone has been added.Heather Peterson

## 2021-01-10 ENCOUNTER — Encounter (HOSPITAL_BASED_OUTPATIENT_CLINIC_OR_DEPARTMENT_OTHER): Payer: Medicare Other | Admitting: Internal Medicine

## 2021-01-10 DIAGNOSIS — L98492 Non-pressure chronic ulcer of skin of other sites with fat layer exposed: Secondary | ICD-10-CM

## 2021-01-10 DIAGNOSIS — S61411D Laceration without foreign body of right hand, subsequent encounter: Secondary | ICD-10-CM

## 2021-01-10 DIAGNOSIS — E11622 Type 2 diabetes mellitus with other skin ulcer: Secondary | ICD-10-CM | POA: Diagnosis not present

## 2021-01-10 LAB — IGG, IGA, IGM
IgA: 966 mg/dL — ABNORMAL HIGH (ref 64–422)
IgG (Immunoglobin G), Serum: 109 mg/dL — ABNORMAL LOW (ref 586–1602)
IgM (Immunoglobulin M), Srm: <5 mg/dL — ABNORMAL LOW (ref 26–217)

## 2021-01-10 LAB — KAPPA/LAMBDA LIGHT CHAINS
Kappa free light chain: 6.4 mg/L (ref 3.3–19.4)
Kappa, lambda light chain ratio: 0.62 (ref 0.26–1.65)
Lambda free light chains: 10.4 mg/L (ref 5.7–26.3)

## 2021-01-10 NOTE — Progress Notes (Signed)
CLARINDA, OBI ([id number]) Visit Report for 01/10/2021 Chief Complaint Document Details Patient Name: Heather Peterson, Heather Peterson. Date of Service: 01/10/2021 10:00 AM Medical Record Number: [id number] Patient Account Number: [id number] Date of Birth/Sex: [birth year] (80 y.o. F) Treating RN: Dolan Amen Primary Heather Peterson Provider: Thereasa Distance Other Clinician: Referring Provider: Thereasa Distance Treating Provider/Extender: Yaakov Guthrie in Treatment: 7 Information Obtained from: Patient Chief Complaint Right hand wound Electronic Signature(s) Signed: 01/10/2021 10:25:54 AM By: Kalman Shan DO Entered By: Kalman Shan on 01/10/2021 10:21:56 Heather Peterson ([id number]) -------------------------------------------------------------------------------- HPI Details Patient Name: Heather Peterson, Heather Peterson. Date of Service: 01/10/2021 10:00 AM Medical Record Number: [id number] Patient Account Number: [id number] Date of Birth/Sex: 07-Jul-1940 (80 y.o. F) Treating RN: Dolan Amen Primary Heather Peterson Provider: Thereasa Distance Other Clinician: Referring Provider: Thereasa Distance Treating Provider/Extender: Yaakov Guthrie in Treatment: 7 History of Present Illness HPI Description: Admission 7/13 Ms. Tava Peery is a 80 year old female with a past medical history of type 2 diabetes on oral agents, COPD and CKD stage III that presents to the clinic for a right hand wound. She reports that 2 weeks ago she cut her hand on her walker. She visited the ED and they placed sutures. She returned to the ED 10 days later to have these removed. Unfortunately the area dehisced and at that time the skin flap had darkness along the edges. She has been using mupirocin cream on it daily. She currently denies signs of infection. 11/27/20 upon evaluation today patient appears for reevaluation here in the clinic due to issues she's been having with the laceration on the palm of her hand  immediately. With that being said there is a basically crescent laceration noted here there is a portion of the flap which is actually to chronic and this is going to need to be cleaned away based on what I see. Fortunately there does not appear to be any evidence of active infection which is great news she does have quite a bit of pain however. Initially we were hoping to be able to debride some this way without having to know her but eventually did have to use lidocaine to numb the area. 7/27; patient presents for 1 week follow-up. She has been using collagen to the wound bed daily. She has no issues or complaints today. She denies signs of infection. 8/3; patient presents for 1 week follow-up. She has been using Santyl to the wound bed Daily. She denies signs of infection. She has no issues or complaints today. 8/17; patient presents for 2-week follow-up. She continues to use Santyl to the wound bed. She denies signs of infection. 8/31; patient presents for 2-week follow-up. She has been using Santyl on the wound bed. She reports improvement in wound healing. She currently denies signs of infection. She has no issues or complaints today. Electronic Signature(s) Signed: 01/10/2021 10:25:54 AM By: Kalman Shan DO Entered By: Kalman Shan on 01/10/2021 10:22:41 Heather Peterson ([id number]) -------------------------------------------------------------------------------- Physical Exam Details Patient Name: Heather Peterson, Heather Peterson. Date of Service: 01/10/2021 10:00 AM Medical Record Number: [id number] Patient Account Number: [id number] Date of Birth/Sex: [birth year] (80 y.o. F) Treating RN: Dolan Amen Primary Heather Peterson Provider: Thereasa Distance Other Clinician: Referring Provider: Thereasa Distance Treating Provider/Extender: Yaakov Guthrie in Treatment: 7 Constitutional . Psychiatric . Notes Right hand: Epithelialization to the previous crescent like wound. No signs of  infection. Electronic Signature(s) Signed: 01/10/2021 10:25:54 AM By: Kalman Shan DO Entered By: Kalman Shan on 01/10/2021 10:23:29 Heather Peterson ([id number]) -------------------------------------------------------------------------------- Physician Orders Details  Patient Name: Heather Peterson, Heather Peterson. Date of Service: 01/10/2021 10:00 AM Medical Record Number: [id number] Patient Account Number: [id number] Date of Birth/Sex: 05/02/1941 (80 y.o. F) Treating RN: Dolan Amen Primary Heather Peterson Provider: Thereasa Distance Other Clinician: Referring Provider: Thereasa Distance Treating Provider/Extender: Yaakov Guthrie in Treatment: 7 Verbal / Phone Orders: No Diagnosis Coding Discharge From [facility] Services o Discharge from [place] Treatment Complete - Apply vaseline or triple antibiotic ointment to wound area and keep covered for 1 week Wound Treatment Electronic Signature(s) Signed: 01/10/2021 10:25:54 AM By: Kalman Shan DO Signed: 01/10/2021 4:30:44 PM By: Dolan Amen RN Entered By: Dolan Amen on 01/10/2021 10:19:39 Heather Peterson ([id number]) -------------------------------------------------------------------------------- Problem List Details Patient Name: Heather Peterson, Heather Peterson. Date of Service: 01/10/2021 10:00 AM Medical Record Number: [id number] Patient Account Number: [id number] Date of Birth/Sex: 1940-07-13 (80 y.o. F) Treating RN: Dolan Amen Primary Heather Peterson Provider: Thereasa Distance Other Clinician: Referring Provider: Thereasa Distance Treating Provider/Extender: Yaakov Guthrie in Treatment: 7 Active Problems ICD-10 Encounter Code Description Active Date MDM Diagnosis S61.411D Laceration without foreign body of right hand, subsequent encounter 11/22/2020 No Yes L98.492 Non-pressure chronic ulcer of skin of other sites with fat layer exposed 11/22/2020 No Yes Inactive Problems Resolved Problems Electronic Signature(s) Signed:  01/10/2021 10:25:54 AM By: Kalman Shan DO Entered By: Kalman Shan on 01/10/2021 10:21:41 Heather Peterson, Heather M. ([id number]) -------------------------------------------------------------------------------- Progress Note Details Patient Name: Heather Peterson. Date of Service: 01/10/2021 10:00 AM Medical Record Number: [id number] Patient Account Number: [id number] Date of Birth/Sex: [birth year] (80 y.o. F) Treating RN: Dolan Amen Primary Heather Peterson Provider: Thereasa Distance Other Clinician: Referring Provider: Thereasa Distance Treating Provider/Extender: Yaakov Guthrie in Treatment: 7 Subjective Chief Complaint Information obtained from Patient Right hand wound History of Present Illness (HPI) Admission 7/13 Ms. Janyah Singleterry is a 80 year old female with a past medical history of type 2 diabetes on oral agents, COPD and CKD stage III that presents to the clinic for a right hand wound. She reports that 2 weeks ago she cut her hand on her walker. She visited the ED and they placed sutures. She returned to the ED 10 days later to have these removed. Unfortunately the area dehisced and at that time the skin flap had darkness along the edges. She has been using mupirocin cream on it daily. She currently denies signs of infection. 11/27/20 upon evaluation today patient appears for reevaluation here in the clinic due to issues she's been having with the laceration on the palm of her hand immediately. With that being said there is a basically crescent laceration noted here there is a portion of the flap which is actually to chronic and this is going to need to be cleaned away based on what I see. Fortunately there does not appear to be any evidence of active infection which is great news she does have quite a bit of pain however. Initially we were hoping to be able to debride some this way without having to know her but eventually did have to use lidocaine to numb the area. 7/27;  patient presents for 1 week follow-up. She has been using collagen to the wound bed daily. She has no issues or complaints today. She denies signs of infection. 8/3; patient presents for 1 week follow-up. She has been using Santyl to the wound bed Daily. She denies signs of infection. She has no issues or complaints today. 8/17; patient presents for 2-week follow-up. She continues to use Santyl to the wound bed. She denies signs  of infection. 8/31; patient presents for 2-week follow-up. She has been using Santyl on the wound bed. She reports improvement in wound healing. She currently denies signs of infection. She has no issues or complaints today. Patient History Information obtained from Patient. Social History Current every day smoker, Marital Status - Married, Alcohol Use - Never, Drug Use - No History, Caffeine Use - Daily. Medical History Eyes Patient has history of Cataracts Denies history of Glaucoma, Optic Neuritis Ear/Nose/Mouth/Throat Denies history of Chronic sinus problems/congestion, Middle ear problems Hematologic/Lymphatic Patient has history of Anemia Denies history of Hemophilia, Human Immunodeficiency Virus, Lymphedema, Sickle Cell Disease Respiratory Patient has history of Chronic Obstructive Pulmonary Disease (COPD) Denies history of Aspiration, Asthma, Pneumothorax, Sleep Apnea, Tuberculosis Cardiovascular Patient has history of Congestive Heart Failure, Hypertension, Myocardial Infarction Gastrointestinal Denies history of Cirrhosis , Colitis, Crohn s, Hepatitis A, Hepatitis B, Hepatitis C Endocrine Patient has history of Type II Diabetes Denies history of Type I Diabetes Genitourinary Patient has history of End Stage Renal Disease - CKD stage 3 Immunological Denies history of Lupus Erythematosus, Raynaud s, Scleroderma Integumentary (Skin) Denies history of History of Burn, History of pressure wounds Musculoskeletal Patient has history of Gout,  Osteoarthritis Neurologic Patient has history of Neuropathy Denies history of Dementia, Quadriplegia, Paraplegia, Seizure Disorder Heather Peterson, Heather Peterson ([id number]) Oncologic Denies history of Received Chemotherapy, Received Radiation Psychiatric Denies history of Anorexia/bulimia, Confinement Anxiety Medical And Surgical History Notes Oncologic Multiple myeloma Objective Constitutional Vitals Time Taken: 10:07 AM, Height: 63 in, Weight: 156 lbs, BMI: 27.6, Temperature: 98.2 F, Pulse: 97 bpm, Respiratory Rate: 18 breaths/min, Blood Pressure: 95/62 mmHg. General Notes: Right hand: Epithelialization to the previous crescent like wound. No signs of infection. Integumentary (Hair, Skin) Wound #1 status is Open. Original cause of wound was Skin Tear/Laceration. The date acquired was: 11/08/2020. The wound has been in treatment 7 weeks. The wound is located on the Right Hand - Palm. The wound measures 0cm length x 0cm width x 0cm depth; 0cm^2 area and 0cm^3 volume. There is no tunneling or undermining noted. There is a none present amount of drainage noted. There is no granulation within the wound bed. There is no necrotic tissue within the wound bed. Assessment Active Problems ICD-10 Laceration without foreign body of right hand, subsequent encounter Non-pressure chronic ulcer of skin of other sites with fat layer exposed Patient presents with improvement in wound healing. Her wound is closed. The skin is still thin From where the previous wound was. I recommended antibiotic ointment and keeping it covered and change daily for the next week. I told her to call us if there is any issues and she can follow-up as needed. Plan Discharge From [facility] Services: Discharge from [place] Treatment Complete - Apply vaseline or triple antibiotic ointment to wound area and keep covered for 1 week 1. Antibiotic ointment with dressing changes daily for the next week 2. Discharge from clinic due  to closed wound 3. Follow-up as needed Electronic Signature(s) Signed: 01/10/2021 10:25:54 AM By: Kalman Shan DO Entered By: Kalman Shan on 01/10/2021 10:25:18 Heather Peterson, Heather Peterson ([id number]) Crocker, [name] ([id number]) -------------------------------------------------------------------------------- ROS/PFSH Details Patient Name: Heather Peterson, Heather Peterson. Date of Service: 01/10/2021 10:00 AM Medical Record Number: [id number] Patient Account Number: [id number] Date of Birth/Sex: 04/15/1941 (80 y.o. F) Treating RN: Dolan Amen Primary Heather Peterson Provider: Thereasa Distance Other Clinician: Referring Provider: Thereasa Distance Treating Provider/Extender: Yaakov Guthrie in Treatment: 7 Information Obtained From Patient Eyes Medical History: Positive for: Cataracts Negative for: Glaucoma;  Optic Neuritis Ear/Nose/Mouth/Throat Medical History: Negative for: Chronic sinus problems/congestion; Middle ear problems Hematologic/Lymphatic Medical History: Positive for: Anemia Negative for: Hemophilia; Human Immunodeficiency Virus; Lymphedema; Sickle Cell Disease Respiratory Medical History: Positive for: Chronic Obstructive Pulmonary Disease (COPD) Negative for: Aspiration; Asthma; Pneumothorax; Sleep Apnea; Tuberculosis Cardiovascular Medical History: Positive for: Congestive Heart Failure; Hypertension; Myocardial Infarction Gastrointestinal Medical History: Negative for: Cirrhosis ; Colitis; Crohnos; Hepatitis A; Hepatitis B; Hepatitis C Endocrine Medical History: Positive for: Type II Diabetes Negative for: Type I Diabetes Time with diabetes: 5 years Treated with: Insulin, Diet Blood sugar tested every day: No Genitourinary Medical History: Positive for: End Stage Renal Disease - CKD stage 3 Immunological Medical History: Negative for: Lupus Erythematosus; Raynaudos; Scleroderma Integumentary (Skin) Heather Peterson, Heather Peterson. ([id number]) Medical History: Negative  for: History of Burn; History of pressure wounds Musculoskeletal Medical History: Positive for: Gout; Osteoarthritis Neurologic Medical History: Positive for: Neuropathy Negative for: Dementia; Quadriplegia; Paraplegia; Seizure Disorder Oncologic Medical History: Negative for: Received Chemotherapy; Received Radiation Past Medical History Notes: Multiple myeloma Psychiatric Medical History: Negative for: Anorexia/bulimia; Confinement Anxiety HBO Extended History Items Eyes: Cataracts Immunizations Pneumococcal Vaccine: Received Pneumococcal Vaccination: No Implantable Devices Yes Family and Social History Current every day smoker; Marital Status - Married; Alcohol Use: Never; Drug Use: No History; Caffeine Use: Daily Electronic Signature(s) Signed: 01/10/2021 10:25:54 AM By: Kalman Shan DO Signed: 01/10/2021 4:30:44 PM By: Dolan Amen RN Entered By: Kalman Shan on 01/10/2021 10:22:47 Heather Peterson, Heather Peterson ([id number]) -------------------------------------------------------------------------------- [place] Details Patient Name: Heather Peterson, Heather Peterson. Date of Service: 01/10/2021 Medical Record Number: [id number] Patient Account Number: [id number] Date of Birth/Sex: 1940-07-24 (80 y.o. F) Treating RN: Dolan Amen Primary Heather Peterson Provider: Thereasa Distance Other Clinician: Referring Provider: Thereasa Distance Treating Provider/Extender: Yaakov Guthrie in Treatment: 7 Diagnosis Coding ICD-10 Codes Code Description [id number] Laceration without foreign body of right hand, subsequent encounter L98.492 Non-pressure chronic ulcer of skin of other sites with fat layer exposed Facility Procedures CPT4 Code: [id number] Description: [phone] - WOUND Heather Peterson VISIT-LEV 2 EST PT Modifier: Quantity: 1 Physician Procedures CPT4 Code: 9381017 Description: 51025 - WC PHYS LEVEL 3 - EST PT Modifier: Quantity: 1 CPT4 Code: Description: ICD-10 Diagnosis Description S61.411D  Laceration without foreign body of right hand, subsequent encounter L98.492 Non-pressure chronic ulcer of skin of other sites with fat layer expos Modifier: ed Quantity: Electronic Signature(s) Signed: 01/10/2021 10:25:54 AM By: Kalman Shan DO Entered By: Kalman Shan on 01/10/2021 10:25:33

## 2021-01-10 NOTE — Progress Notes (Signed)
Heather Peterson ([id number]) Visit Report for 01/10/2021 Arrival Information Details Patient Name: Heather Peterson, Heather Peterson. Date of Service: 01/10/2021 10:00 AM Medical Record Number: [id number] Patient Account Number: [id number] Date of Birth/Sex: 1940/07/28 (80 y.o. F) Treating RN: Dolan Amen Primary Care Ndia Sampath: Thereasa Distance Other Clinician: Referring Jaryn Rosko: Thereasa Distance Treating Shondra Capps/Extender: Yaakov Guthrie in Treatment: 7 Visit Information History Since Last Visit Pain Present Now: No Patient Arrived: Walker Arrival Time: 10:07 Accompanied By: husband Transfer Assistance: None Patient Identification Verified: Yes Secondary Verification Process Completed: Yes Patient Has Alerts: Yes Patient Alerts: Type II Diabetic PACEMAKER Electronic Signature(s) Signed: 01/10/2021 4:30:44 PM By: Dolan Amen RN Entered By: Dolan Amen on 01/10/2021 10:07:17 Heather Peterson ([id number]) -------------------------------------------------------------------------------- Clinic Level of Care Assessment Details Patient Name: Heather Peterson. Date of Service: 01/10/2021 10:00 AM Medical Record Number: [id number] Patient Account Number: [id number] Date of Birth/Sex: [birth year] (80 y.o. F) Treating RN: Dolan Amen Primary Care Toriana Sponsel: Thereasa Distance Other Clinician: Referring Eliane Hammersmith: Thereasa Distance Treating Davius Goudeau/Extender: Yaakov Guthrie in Treatment: 7 Clinic Level of Care Assessment Items TOOL 4 Quantity Score X - Use when only an EandM is performed on FOLLOW-UP visit 1 0 ASSESSMENTS - Nursing Assessment / Reassessment X - Reassessment of Co-morbidities (includes updates in patient status) 1 10 X- 1 5 Reassessment of Adherence to Treatment Plan ASSESSMENTS - Wound and Skin Assessment / Reassessment []  - Simple Wound Assessment / Reassessment - one wound 0 []  - 0 Complex Wound Assessment / Reassessment - multiple wounds []  -  0 Dermatologic / Skin Assessment (not related to wound area) ASSESSMENTS - Focused Assessment []  - Circumferential Edema Measurements - multi extremities 0 []  - 0 Nutritional Assessment / Counseling / Intervention []  - 0 Lower Extremity Assessment (monofilament, tuning fork, pulses) []  - 0 Peripheral Arterial Disease Assessment (using hand held doppler) ASSESSMENTS - Ostomy and/or Continence Assessment and Care []  - Incontinence Assessment and Management 0 []  - 0 Ostomy Care Assessment and Management (repouching, etc.) PROCESS - Coordination of Care X - Simple Patient / Family Education for ongoing care 1 15 []  - 0 Complex (extensive) Patient / Family Education for ongoing care []  - 0 Staff obtains Programmer, systems, Records, Test Results / Process Orders []  - 0 Staff telephones HHA, Nursing Homes / Clarify orders / etc []  - 0 Routine Transfer to another Facility (non-emergent condition) []  - 0 Routine Hospital Admission (non-emergent condition) []  - 0 New Admissions / Biomedical engineer / Ordering NPWT, Apligraf, etc. []  - 0 Emergency Hospital Admission (emergent condition) X- 1 10 Simple Discharge Coordination []  - 0 Complex (extensive) Discharge Coordination PROCESS - Special Needs []  - Pediatric / Minor Patient Management 0 []  - 0 Isolation Patient Management []  - 0 Hearing / Language / Visual special needs []  - 0 Assessment of Community assistance (transportation, D/C planning, etc.) []  - 0 Additional assistance / Altered mentation []  - 0 Support Surface(s) Assessment (bed, cushion, seat, etc.) INTERVENTIONS - Wound Cleansing / Measurement Lloyd, Keisi M. ([id number]) []  - 0 Simple Wound Cleansing - one wound []  - 0 Complex Wound Cleansing - multiple wounds []  - 0 Wound Imaging (photographs - any number of wounds) []  - 0 Wound Tracing (instead of photographs) []  - 0 Simple Wound Measurement - one wound []  - 0 Complex Wound Measurement - multiple  wounds INTERVENTIONS - Wound Dressings []  - Small Wound Dressing one or multiple wounds 0 []  - 0 Medium Wound Dressing one or multiple wounds []  - 0 Large Wound  Dressing one or multiple wounds []  - 0 Application of Medications - topical []  - 0 Application of Medications - injection INTERVENTIONS - Miscellaneous []  - External ear exam 0 []  - 0 Specimen Collection (cultures, biopsies, blood, body fluids, etc.) []  - 0 Specimen(s) / Culture(s) sent or taken to Lab for analysis []  - 0 Patient Transfer (multiple staff / Civil Service fast streamer / Similar devices) []  - 0 Simple Staple / Suture removal (25 or less) []  - 0 Complex Staple / Suture removal (26 or more) []  - 0 Hypo / Hyperglycemic Management (close monitor of Blood Glucose) []  - 0 Ankle / Brachial Index (ABI) - do not check if billed separately X- 1 5 Vital Signs Has the patient been seen at the hospital within the last three years: Yes Total Score: 45 Level Of Care: New/Established - Level 2 Electronic Signature(s) Signed: 01/10/2021 4:30:44 PM By: Dolan Amen RN Entered By: Dolan Amen on 01/10/2021 10:19:56 Heather Peterson ([id number]) -------------------------------------------------------------------------------- Encounter Discharge Information Details Patient Name: Heather Peterson. Date of Service: 01/10/2021 10:00 AM Medical Record Number: [id number] Patient Account Number: [id number] Date of Birth/Sex: 11-03-1940 (80 y.o. F) Treating RN: Dolan Amen Primary Care Rishard Delange: Thereasa Distance Other Clinician: Referring Novak Stgermaine: Thereasa Distance Treating Josiah Nieto/Extender: Yaakov Guthrie in Treatment: 7 Encounter Discharge Information Items Discharge Condition: Stable Ambulatory Status: Walker Discharge Destination: Home Transportation: Private Auto Accompanied By: husband Schedule Follow-up Appointment: No Clinical Summary of Care: Electronic Signature(s) Signed: 01/10/2021 4:30:44 PM By:  Dolan Amen RN Entered By: Dolan Amen on 01/10/2021 10:20:33 Heather Peterson ([id number]) -------------------------------------------------------------------------------- Lower Extremity Assessment Details Patient Name: Heather Peterson. Date of Service: 01/10/2021 10:00 AM Medical Record Number: [id number] Patient Account Number: [id number] Date of Birth/Sex: 1941-01-21 (80 y.o. F) Treating RN: Dolan Amen Primary Care Adele Milson: Thereasa Distance Other Clinician: Referring Deandre Stansel: Thereasa Distance Treating Mayia Megill/Extender: Yaakov Guthrie in Treatment: 7 Electronic Signature(s) Signed: 01/10/2021 4:30:44 PM By: Dolan Amen RN Entered By: Dolan Amen on 01/10/2021 10:14:55 LARENDA, REEDY ([id number]) -------------------------------------------------------------------------------- Multi Wound Chart Details Patient Name: DAVIANNA, DEUTSCHMAN. Date of Service: 01/10/2021 10:00 AM Medical Record Number: [id number] Patient Account Number: [id number] Date of Birth/Sex: [birth year] (80 y.o. F) Treating RN: Dolan Amen Primary Care Sartaj Hoskin: Thereasa Distance Other Clinician: Referring Makayla Confer: Thereasa Distance Treating Elazar Argabright/Extender: Yaakov Guthrie in Treatment: 7 Vital Signs Height(in): 6 Pulse(bpm): 97 Weight(lbs): 156 Blood Pressure(mmHg): 57/62 Body Mass Index(BMI): 28 Temperature(F): 98.2 Respiratory Rate(breaths/min): 18 Photos: [N/A:N/A] Wound Location: Right Hand - Palm N/A N/A Wounding Event: Skin Tear/Laceration N/A N/A Primary Etiology: Dehisced Wound N/A N/A Comorbid History: Cataracts, Anemia, Chronic N/A N/A Obstructive Pulmonary Disease (COPD), Congestive Heart Failure, Hypertension, Myocardial Infarction, Type II Diabetes, End Stage Renal Disease, Gout, Osteoarthritis, Neuropathy Date Acquired: 11/08/2020 N/A N/A Weeks of Treatment: 7 N/A N/A Wound Status: Open N/A N/A Measurements L x W x D (cm) 0x0x0 N/A  N/A Area (cm) : 0 N/A N/A Volume (cm) : 0 N/A N/A % Reduction in Area: 100.00% N/A N/A % Reduction in Volume: 100.00% N/A N/A Classification: Full Thickness Without Exposed N/A N/A Support Structures Exudate Amount: None Present N/A N/A Granulation Amount: None Present (0%) N/A N/A Necrotic Amount: None Present (0%) N/A N/A Exposed Structures: Fascia: No N/A N/A Fat Layer (Subcutaneous Tissue): No Tendon: No Muscle: No Joint: No Bone: No Epithelialization: Large (67-100%) N/A N/A Treatment Notes Wound #1 (Hand - Palm) Wound Laterality: Right Cleanser Peri-Wound Care Topical CHARISH, SCHROEPFER. ([id number]) Primary Dressing Secondary  Dressing Secured With Compression Wrap Compression Stockings Add-Ons Electronic Signature(s) Signed: 01/10/2021 10:25:54 AM By: Kalman Shan DO Entered By: Kalman Shan on 01/10/2021 10:21:47 GENA, LASKI ([id number]) -------------------------------------------------------------------------------- [place] Details Patient Name: DYLYNN, KETNER. Date of Service: 01/10/2021 10:00 AM Medical Record Number: [id number] Patient Account Number: [id number] Date of Birth/Sex: 22-Jan-1941 (80 y.o. F) Treating RN: Dolan Amen Primary Care Louine Tenpenny: Thereasa Distance Other Clinician: Referring Clarivel Callaway: Thereasa Distance Treating Almus Woodham/Extender: Yaakov Guthrie in Treatment: 7 Active Inactive Electronic Signature(s) Signed: 01/10/2021 4:30:44 PM By: Dolan Amen RN Entered By: Dolan Amen on 01/10/2021 10:18:20 Heather Peterson ([id number]) -------------------------------------------------------------------------------- Pain Assessment Details Patient Name: LYNZEE, LINDQUIST. Date of Service: 01/10/2021 10:00 AM Medical Record Number: [id number] Patient Account Number: [id number] Date of Birth/Sex: 03/03/1941 (80 y.o. F) Treating RN: Dolan Amen Primary Care Appolonia Ackert: Thereasa Distance  Other Clinician: Referring Daesha Insco: Thereasa Distance Treating Corina Stacy/Extender: Yaakov Guthrie in Treatment: 7 Active Problems Location of Pain Severity and Description of Pain Patient Has Paino No Site Locations Rate the pain. Current Pain Level: 0 Pain Management and Medication Current Pain Management: Electronic Signature(s) Signed: 01/10/2021 4:30:44 PM By: Dolan Amen RN Entered By: Dolan Amen on 01/10/2021 10:08:43 Heather Peterson ([id number]) -------------------------------------------------------------------------------- Patient/Caregiver Education Details Patient Name: KOA, PALLA. Date of Service: 01/10/2021 10:00 AM Medical Record Number: [id number] Patient Account Number: [id number] Date of Birth/Gender: 1940/12/29 (80 y.o. F) Treating RN: Dolan Amen Primary Care Physician: Thereasa Distance Other Clinician: Referring Physician: Thereasa Distance Treating Physician/Extender: Yaakov Guthrie in Treatment: 7 Education Assessment Education Provided To: Patient Education Topics Provided Notes discharge instructions Electronic Signature(s) Signed: 01/10/2021 4:30:44 PM By: Dolan Amen RN Entered By: Dolan Amen on 01/10/2021 10:20:11 Heather Peterson ([id number]) -------------------------------------------------------------------------------- Wound Assessment Details Patient Name: AKI, BURDIN. Date of Service: 01/10/2021 10:00 AM Medical Record Number: [id number] Patient Account Number: [id number] Date of Birth/Sex: [birth year] (80 y.o. F) Treating RN: Dolan Amen Primary Care Audianna Landgren: Thereasa Distance Other Clinician: Referring Sani Loiseau: Thereasa Distance Treating Lesean Woolverton/Extender: Yaakov Guthrie in Treatment: 7 Wound Status Wound Number: 1 Primary Dehisced Wound Etiology: Wound Location: Right Hand - Palm Wound Open Wounding Event: Skin Tear/Laceration Status: Date Acquired:  11/08/2020 Comorbid Cataracts, Anemia, Chronic Obstructive Pulmonary Disease Weeks Of Treatment: 7 History: (COPD), Congestive Heart Failure, Hypertension, Clustered Wound: No Myocardial Infarction, Type II Diabetes, End Stage Renal Disease, Gout, Osteoarthritis, Neuropathy Photos Wound Measurements Length: (cm) 0 Width: (cm) 0 Depth: (cm) 0 Area: (cm) Volume: (cm) % Reduction in Area: 100% % Reduction in Volume: 100% Epithelialization: Large (67-100%) 0 Tunneling: No 0 Undermining: No Wound Description Classification: Full Thickness Without Exposed Support Structures Exudate Amount: None Present Foul Odor After Cleansing: No Slough/Fibrino No Wound Bed Granulation Amount: None Present (0%) Exposed Structure Necrotic Amount: None Present (0%) Fascia Exposed: No Fat Layer (Subcutaneous Tissue) Exposed: No Tendon Exposed: No Muscle Exposed: No Joint Exposed: No Bone Exposed: No Electronic Signature(s) Signed: 01/10/2021 4:30:44 PM By: Dolan Amen RN Entered By: Dolan Amen on 01/10/2021 10:12:03 Heather Peterson ([id number]) -------------------------------------------------------------------------------- [place] Details Patient Name: Heather Peterson. Date of Service: 01/10/2021 10:00 AM Medical Record Number: [id number] Patient Account Number: [id number] Date of Birth/Sex: 1940-11-04 (80 y.o. F) Treating RN: Dolan Amen Primary Care Averee Harb: Thereasa Distance Other Clinician: Referring Mitra Duling: Thereasa Distance Treating Kyli Sorter/Extender: Yaakov Guthrie in Treatment: 7 Vital Signs Time Taken: 10:07 Temperature (F): 98.2 Height (in): 63 Pulse (bpm): 97 Weight (lbs): 156 Respiratory Rate (breaths/min): 18  Body Mass Index (BMI): 27.6 Blood Pressure (mmHg): 95/62 Reference Range: 80 - 120 mg / dl Electronic Signature(s) Signed: 01/10/2021 4:30:44 PM By: Dolan Amen RN Entered By: Dolan Amen on 01/10/2021 83:03:22

## 2021-01-12 ENCOUNTER — Inpatient Hospital Stay: Payer: Medicare Other

## 2021-01-12 LAB — PROTEIN ELECTROPHORESIS, SERUM
A/G Ratio: 1.3 (ref 0.7–1.7)
Albumin ELP: 3.5 g/dL (ref 2.9–4.4)
Alpha-1-Globulin: 0.3 g/dL (ref 0.0–0.4)
Alpha-2-Globulin: 0.8 g/dL (ref 0.4–1.0)
Beta Globulin: 1.4 g/dL — ABNORMAL HIGH (ref 0.7–1.3)
Gamma Globulin: 0.3 g/dL — ABNORMAL LOW (ref 0.4–1.8)
Globulin, Total: 2.8 g/dL (ref 2.2–3.9)
M-Spike, %: 0.6 g/dL — ABNORMAL HIGH
Total Protein ELP: 6.3 g/dL (ref 6.0–8.5)

## 2021-01-28 NOTE — Progress Notes (Signed)
[facility]  Telephone:([phone]) [phone] Fax:(336) [phone]  ID: Heather Peterson OB: 15-Jul-1940  MR#: [id number]  TIW#:[id number]  [name], MD as PCP - General (Family Medicine) Lloyd Huger, MD as Consulting Physician (Oncology)  CHIEF COMPLAINT: Multiple myeloma, subdural hematoma.  INTERVAL HISTORY: Patient returns to clinic today for further evaluation and reconsideration of cycle 6, day 1 of single agent Velcade.  Her peripheral edema and CHF symptoms have essentially resolved.  She continues to have weakness and fatigue, but this has improved. She does not complain of any neuropathy today.  She has no neurologic complaints.  She has chronic shortness of breath and requires oxygen 24 hours/day.  She denies any fevers.  She has a fair appetite.  She denies any chest pain, cough, or hemoptysis. She denies any nausea, vomiting, constipation or diarrhea. She has no melena or hematochezia.  She has no urinary complaints.  Patient offers no further specific complaints today.  REVIEW OF SYSTEMS:   Review of Systems  Constitutional:  Positive for malaise/fatigue. Negative for fever.  Respiratory:  Positive for shortness of breath. Negative for cough and hemoptysis.   Cardiovascular: Negative.  Negative for chest pain and leg swelling.  Gastrointestinal: Negative.  Negative for abdominal pain, diarrhea, melena and nausea.  Genitourinary: Negative.  Negative for dysuria and hematuria.  Musculoskeletal:  Positive for back pain. Negative for joint pain.  Skin: Negative.  Negative for rash.  Neurological:  Positive for weakness. Negative for dizziness, sensory change, focal weakness and headaches.  Psychiatric/Behavioral: Negative.  The patient is not nervous/anxious.    As per HPI. Otherwise, a complete review of systems is negative.  PAST MEDICAL HISTORY: Past Medical History:  Diagnosis Date   Anxiety    Chronic combined systolic and  diastolic CHF (congestive heart failure) (HCC)    Chronic kidney disease    Coronary artery disease    Depression    Diabetes mellitus without complication (HCC)    Diabetes mellitus, type II ([place])    Hypertension    MI (myocardial infarction) ([place])    x 5   Pacemaker    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[place])    Prolonged Q-T interval on ECG    Thyroid disease     PAST SURGICAL HISTORY: Past Surgical History:  Procedure Laterality Date   CENTRAL LINE INSERTION  03/11/2017   Procedure: CENTRAL LINE INSERTION;  Surgeon: Leonie Man, MD;  Location: [facility];  Service: Cardiovascular;;   CHOLECYSTECTOMY     COLONOSCOPY WITH PROPOFOL N/A 09/01/2019   Procedure: COLONOSCOPY WITH PROPOFOL;  Surgeon: Toledo, Benay Pike, MD;  Location: [facility];  Service: Gastroenterology;  Laterality: N/A;   CORONARY STENT INTERVENTION W/IMPELLA N/A 03/11/2017   Procedure: Coronary Stent Intervention w/Impella;  Surgeon: Leonie Man, MD;  Location: [facility];  Service: Cardiovascular;  Laterality: N/A;   ESOPHAGOGASTRODUODENOSCOPY (EGD) WITH PROPOFOL N/A 09/01/2019   Procedure: ESOPHAGOGASTRODUODENOSCOPY (EGD) WITH PROPOFOL;  Surgeon: Toledo, Benay Pike, MD;  Location: [facility];  Service: Gastroenterology;  Laterality: N/A;   ESOPHAGOGASTRODUODENOSCOPY (EGD) WITH PROPOFOL N/A 09/08/2019   Procedure: ESOPHAGOGASTRODUODENOSCOPY (EGD) WITH PROPOFOL;  Surgeon: [name], MD;  Location: [facility];  Service: Gastroenterology;  Laterality: N/A;   EYE SURGERY     HIP ARTHROPLASTY Right 03/29/2020   Procedure: ARTHROPLASTY BIPOLAR HIP (HEMIARTHROPLASTY);  Surgeon: Corky Mull, MD;  Location: [facility];  Service: Orthopedics;  Laterality: Right;   INTRAVASCULAR PRESSURE WIRE/FFR STUDY N/A  03/11/2017   Procedure: INTRAVASCULAR PRESSURE WIRE/FFR STUDY;  Surgeon: Leonie Man, MD;  Location: [facility];  Service: Cardiovascular;  Laterality: N/A;   INTRAVASCULAR  ULTRASOUND/IVUS N/A 03/11/2017   Procedure: Intravascular Ultrasound/IVUS;  Surgeon: Leonie Man, MD;  Location: [facility];  Service: Cardiovascular;  Laterality: N/A;   LEFT HEART CATH AND CORONARY ANGIOGRAPHY N/A 03/05/2017   Procedure: LEFT HEART CATH AND CORONARY ANGIOGRAPHY;  Surgeon: Isaias Cowman, MD;  Location: [facility];  Service: Cardiovascular;  Laterality: N/A;   PACEMAKER IMPLANT     pacemaker/defibrillator Left     FAMILY HISTORY: Family History  Problem Relation Age of Onset   Hypertension Father    Heart attack Father    Depression Sister    Depression Brother    Depression Brother     ADVANCED DIRECTIVES (Y/N):  N  HEALTH MAINTENANCE: Social History   Tobacco Use   Smoking status: Every Day    Packs/day: 0.25    Types: E-cigarettes, Cigarettes   Smokeless tobacco: Never  Vaping Use   Vaping Use: Former  Substance Use Topics   Alcohol use: Not Currently    Comment: occasionally   Drug use: Yes    Comment: prescribed pain meds     Colonoscopy:  PAP:  Bone density:  Lipid panel:  Allergies  Allergen Reactions   Celebrex [Celecoxib] Anaphylaxis   Glipizide Anaphylaxis   Levaquin [Levofloxacin In D5w] Other (See Comments)    Heart arrhthymias   Levofloxacin Other (See Comments) and Palpitations    ICD fired   Lisinopril Swelling    Lip and facial swelling   Sulfa Antibiotics Other (See Comments) and Anaphylaxis    Reaction: unknown   Metformin Other (See Comments)    Gi tolerance    Penicillins Rash and Other (See Comments)    Has patient had a PCN reaction causing immediate rash, facial/tongue/throat swelling, SOB or lightheadedness with hypotension: Unknown Has patient had a PCN reaction causing severe rash involving mucus membranes or skin necrosis: No Has patient had a PCN reaction that required hospitalization: No Has patient had a PCN reaction occurring within the last 10 years: No If all of the above  answers are "NO", then may proceed with Cephalosporin use.     Current Outpatient Medications  Medication Sig Dispense Refill   allopurinol (ZYLOPRIM) 100 MG tablet Take 50 mg by mouth daily.     aspirin EC 81 MG tablet Take 81 mg by mouth at bedtime.      dexamethasone (DECADRON) 4 MG tablet Take 5 tablets (20 mg total) by mouth daily. Take the day after Velcade on days 2,5,9,12. Take with breakfast 40 tablet 3   docusate sodium (COLACE) 100 MG capsule Take 100 mg by mouth 2 (two) times daily.     Ensure Max Protein (ENSURE MAX PROTEIN) LIQD Take 330 mLs (11 oz total) by mouth 2 (two) times daily between meals.     fexofenadine (ALLEGRA) 180 MG tablet Take 180 mg by mouth daily.     FLUoxetine (PROZAC) 10 MG capsule Take 10 mg by mouth daily.     gabapentin (NEURONTIN) 100 MG capsule TAKE 1 CAPSULE(100 MG) BY MOUTH THREE TIMES DAILY (Patient taking differently: Patient reports 100 mg QAM, 700 mg QHS) 270 capsule 1   gabapentin (NEURONTIN) 600 MG tablet Take 600 mg by mouth daily. Patient takes 100 mg QAM, 700 mg QHS     HYDROcodone-acetaminophen (NORCO/VICODIN) 5-325 MG tablet  hydrocortisone (ANUSOL-HC) 2.5 % rectal cream Apply 1 application topically 2 (two) times daily.     Lactulose 20 GM/30ML SOLN Take 30 mLs (20 g total) by mouth in the morning and at bedtime. 450 mL 1   levothyroxine (SYNTHROID) 50 MCG tablet Take by mouth.     levothyroxine (SYNTHROID) 75 MCG tablet Take 1 tablet (75 mcg total) by mouth daily before breakfast. On Monday, Wednesday and Friday     magnesium oxide (MAG-OX) 400 MG tablet Take 200 mg by mouth daily.     mexiletine (MEXITIL) 200 MG capsule Take 1 capsule (200 mg total) by mouth every 12 (twelve) hours. 60 capsule 1   midodrine (PROAMATINE) 10 MG tablet Take 1 tablet (10 mg total) by mouth 2 (two) times daily with a meal.     OXYGEN Inhale 2 L into the lungs continuous.     pantoprazole (PROTONIX) 40 MG tablet Take 40 mg by mouth daily.       polyethylene glycol (MIRALAX) 17 g packet Take 17 g by mouth daily. 14 each 0   potassium chloride SA (KLOR-CON) 20 MEQ tablet Take 20 mEq by mouth daily.     Prenatal Vit-Fe Fumarate-FA (PRENATAL MULTIVITAMIN) TABS tablet Take 1 tablet by mouth daily.     Probiotic Product (PROBIOTIC DAILY PO) Take 1 tablet by mouth daily.     SANTYL ointment Apply topically.     simvastatin (ZOCOR) 40 MG tablet Take 20 mg by mouth at bedtime.   2   spironolactone (ALDACTONE) 25 MG tablet Take 0.5 tablets (12.5 mg total) by mouth every other day. (Patient taking differently: Take 12.5 mg by mouth daily.) 15 tablet 3   torsemide (DEMADEX) 20 MG tablet Take 40 mg by mouth daily.     traZODone (DESYREL) 50 MG tablet Take 50 mg by mouth at bedtime.     gabapentin (NEURONTIN) 300 MG capsule Take 333m in AM, 3010min afternoon, 90068mt night (Patient not taking: Reported on 01/30/2021) 150 capsule 3   zolpidem (AMBIEN) 5 MG tablet Take 1 tablet (5 mg total) by mouth at bedtime as needed for sleep. (Patient not taking: No sig reported) 30 tablet 0   No current facility-administered medications for this visit.    OBJECTIVE: Vitals:   01/30/21 0941  BP: 93/65  Pulse: 88  Resp: 16  Temp: (!) 96.3 F (35.7 C)     Body mass index is 28.38 kg/m.    ECOG FS:1 - Symptomatic but completely ambulatory  General: Well-developed, well-nourished, no acute distress.  Sitting in a wheelchair. Eyes: Pink conjunctiva, anicteric sclera. HEENT: Normocephalic, moist mucous membranes. Lungs: No audible wheezing or coughing. Heart: Regular rate and rhythm. Abdomen: Soft, nontender, no obvious distention. Musculoskeletal: No edema, cyanosis, or clubbing. Neuro: Alert, answering all questions appropriately. Cranial nerves grossly intact. Skin: No rashes or petechiae noted. Psych: Normal affect.  LAB RESULTS:  Lab Results  Component Value Date   NA 137 01/30/2021   K 4.2 01/30/2021   CL 100 01/30/2021   CO2 26  01/30/2021   GLUCOSE 213 (H) 01/30/2021   BUN 56 (H) 01/30/2021   CREATININE 1.73 (H) 01/30/2021   CALCIUM 9.1 01/30/2021   PROT 7.6 01/30/2021   ALBUMIN 4.4 01/30/2021   AST 15 01/30/2021   ALT 13 01/30/2021   ALKPHOS 114 01/30/2021   BILITOT 0.6 01/30/2021   GFRNONAA 30 (L) 01/30/2021   GFRAA 30 (L) 09/09/2019    Lab Results  Component Value Date  WBC 9.4 01/30/2021   NEUTROABS 7.2 01/30/2021   HGB 6.9 (L) 01/30/2021   HCT 23.6 (L) 01/30/2021   MCV 89.1 01/30/2021   PLT 204 01/30/2021   Lab Results  Component Value Date   IRON 85 05/23/2020   TIBC 421 05/23/2020   IRONPCTSAT 20 05/23/2020   Lab Results  Component Value Date   FERRITIN 50 05/23/2020     STUDIES: No results found.  ASSESSMENT: Multiple myeloma, fractured right hip.  PLAN:    1.  Multiple myeloma: Bone marrow biopsy confirmed diagnosis with plasma cells up to 90% of biopsy.  Cytogenetics are reported as normal.  Metastatic bone survey from March 03, 2020 reviewed independently with multiple skeletal lucencies consistent with myeloma.  Patient's most recent M spike on January 09, 2021 increased slightly to 0.6 with an IgA immunoglobulin level of 966.  IgG and IgM are chronically suppressed. Kappa and lambda free light chains continue to be within normal limits. Initial plan was to give Velcade on days 1, 4, 8, and 11 along with 10 mg Revlimid on days 1 through 14. Patient will also receive weekly 20 mg dexamethasone.  Revlimid was discontinued secondary to intolerance.  Patient did not receive treatment between June 20, 2020 and November 14, 2020.  Treatment was again temporarily discontinued and patient has not had Velcade since December 12, 2020.  Reinitiate treatment today and proceed with cycle 6, day 1 of Velcade.  Return to clinic on Friday for cycle 6, day 4.  Patient will then return to clinic in 1 week for further evaluation and consideration of cycle 6, day 8. 2.  Anemia: Decreased to 6.9.  Proceed  with 1 unit packed red blood cells today.  Likely multifactorial with chronic renal insufficiency, underlying myeloma, as well as history of recent GI bleed.  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   3.  Chronic renal insufficiency: Chronic and unchanged.  Patient's most recent hemoglobin is 1.73. 4.  Ascites/peripheral edema: Resolved.  Continue follow-up with heart failure clinic. 5.  Angiodysplastic lesions: Continue follow-up with GI as scheduled. 6. Constipation: Patient does not complain of this today. Continue MiraLAX and lactulose as needed. 7.  Right hip fracture: Continue rehab and follow-up with orthopedics as scheduled. Continue Percocet as needed. 8. Thrombocytopenia: Resolved. 9.  Peripheral neuropathy: Chronic and unchanged.  Continue 700 mg gabapentin at night in addition to 100 mg in the morning and in the afternoon.    Patient expressed understanding and was in agreement with this plan. She also understands that She can call clinic at any time with any questions, concerns, or complaints.    Lloyd Huger, MD   01/30/2021 5:17 PM

## 2021-01-30 ENCOUNTER — Inpatient Hospital Stay: Payer: Medicare Other

## 2021-01-30 ENCOUNTER — Encounter: Payer: Self-pay | Admitting: Oncology

## 2021-01-30 ENCOUNTER — Inpatient Hospital Stay: Payer: Medicare Other | Attending: Oncology | Admitting: Oncology

## 2021-01-30 VITALS — BP 97/63 | HR 90 | Temp 97.0°F | Resp 19

## 2021-01-30 VITALS — BP 93/65 | HR 88 | Temp 96.3°F | Resp 16 | Wt 160.2 lb

## 2021-01-30 DIAGNOSIS — Z79899 Other long term (current) drug therapy: Secondary | ICD-10-CM | POA: Insufficient documentation

## 2021-01-30 DIAGNOSIS — D649 Anemia, unspecified: Secondary | ICD-10-CM | POA: Insufficient documentation

## 2021-01-30 DIAGNOSIS — N189 Chronic kidney disease, unspecified: Secondary | ICD-10-CM | POA: Insufficient documentation

## 2021-01-30 DIAGNOSIS — I130 Hypertensive heart and chronic kidney disease with heart failure and stage 1 through stage 4 chronic kidney disease, or unspecified chronic kidney disease: Secondary | ICD-10-CM | POA: Insufficient documentation

## 2021-01-30 DIAGNOSIS — E1142 Type 2 diabetes mellitus with diabetic polyneuropathy: Secondary | ICD-10-CM | POA: Insufficient documentation

## 2021-01-30 DIAGNOSIS — C9000 Multiple myeloma not having achieved remission: Secondary | ICD-10-CM | POA: Insufficient documentation

## 2021-01-30 DIAGNOSIS — Z5111 Encounter for antineoplastic chemotherapy: Secondary | ICD-10-CM | POA: Insufficient documentation

## 2021-01-30 LAB — COMPREHENSIVE METABOLIC PANEL
ALT: 13 U/L (ref 0–44)
AST: 15 U/L (ref 15–41)
Albumin: 4.4 g/dL (ref 3.5–5.0)
Alkaline Phosphatase: 114 U/L (ref 38–126)
Anion gap: 11 (ref 5–15)
BUN: 56 mg/dL — ABNORMAL HIGH (ref 8–23)
CO2: 26 mmol/L (ref 22–32)
Calcium: 9.1 mg/dL (ref 8.9–10.3)
Chloride: 100 mmol/L (ref 98–111)
Creatinine, Ser: 1.73 mg/dL — ABNORMAL HIGH (ref 0.44–1.00)
GFR, Estimated: 30 mL/min — ABNORMAL LOW (ref >60)
Glucose, Bld: 213 mg/dL — ABNORMAL HIGH (ref 70–99)
Potassium: 4.2 mmol/L (ref 3.5–5.1)
Sodium: 137 mmol/L (ref 135–145)
Total Bilirubin: 0.6 mg/dL (ref 0.3–1.2)
Total Protein: 7.6 g/dL (ref 6.5–8.1)

## 2021-01-30 LAB — CBC WITH DIFFERENTIAL/PLATELET
Abs Immature Granulocytes: 0.19 10*3/uL — ABNORMAL HIGH (ref 0.00–0.07)
Basophils Absolute: 0.1 10*3/uL (ref 0.0–0.1)
Basophils Relative: 1 %
Eosinophils Absolute: 0.1 10*3/uL (ref 0.0–0.5)
Eosinophils Relative: 1 %
HCT: 23.6 % — ABNORMAL LOW (ref 36.0–46.0)
Hemoglobin: 6.9 g/dL — ABNORMAL LOW (ref 12.0–15.0)
Immature Granulocytes: 2 %
Lymphocytes Relative: 11 %
Lymphs Abs: 1.1 10*3/uL (ref 0.7–4.0)
MCH: 26 pg (ref 26.0–34.0)
MCHC: 29.2 g/dL — ABNORMAL LOW (ref 30.0–36.0)
MCV: 89.1 fL (ref 80.0–100.0)
Monocytes Absolute: 0.8 10*3/uL (ref 0.1–1.0)
Monocytes Relative: 8 %
Neutro Abs: 7.2 10*3/uL (ref 1.7–7.7)
Neutrophils Relative %: 77 %
Platelets: 204 10*3/uL (ref 150–400)
RBC: 2.65 MIL/uL — ABNORMAL LOW (ref 3.87–5.11)
RDW: 19.9 % — ABNORMAL HIGH (ref 11.5–15.5)
WBC: 9.4 10*3/uL (ref 4.0–10.5)
nRBC: 0.3 % — ABNORMAL HIGH (ref 0.0–0.2)

## 2021-01-30 LAB — PREPARE RBC (CROSSMATCH)

## 2021-01-30 LAB — SAMPLE TO BLOOD BANK

## 2021-01-30 MED ORDER — BORTEZOMIB CHEMO SQ INJECTION 3.5 MG (2.5MG/ML)
1.3000 mg/m2 | Freq: Once | INTRAMUSCULAR | Status: AC
  Administered 2021-01-30: 2.5 mg via SUBCUTANEOUS
  Filled 2021-01-30: qty 1

## 2021-01-30 MED ORDER — FUROSEMIDE 10 MG/ML IJ SOLN
20.0000 mg | Freq: Once | INTRAMUSCULAR | Status: AC
  Administered 2021-01-30: 20 mg via INTRAVENOUS
  Filled 2021-01-30: qty 2

## 2021-01-30 MED ORDER — SODIUM CHLORIDE 0.9% IV SOLUTION
250.0000 mL | Freq: Once | INTRAVENOUS | Status: AC
  Administered 2021-01-30: 250 mL via INTRAVENOUS
  Filled 2021-01-30: qty 250

## 2021-01-30 MED ORDER — DIPHENHYDRAMINE HCL 50 MG/ML IJ SOLN
25.0000 mg | Freq: Once | INTRAMUSCULAR | Status: AC
  Administered 2021-01-30: 25 mg via INTRAVENOUS
  Filled 2021-01-30: qty 1

## 2021-01-30 MED ORDER — DEXAMETHASONE 4 MG PO TABS
20.0000 mg | ORAL_TABLET | Freq: Once | ORAL | Status: AC
  Administered 2021-01-30: 20 mg via ORAL
  Filled 2021-01-30: qty 5

## 2021-01-30 MED ORDER — ACETAMINOPHEN 325 MG PO TABS
650.0000 mg | ORAL_TABLET | Freq: Once | ORAL | Status: AC
  Administered 2021-01-30: 650 mg via ORAL
  Filled 2021-01-30: qty 2

## 2021-01-30 NOTE — Progress Notes (Signed)
MD reviewed labs. Per MD to proceed with treatment and blood transfusion today. Orders placed by MD. Pt and treatment team updated.   Heather Peterson [employer]

## 2021-01-30 NOTE — Patient Instructions (Signed)
Blood Transfusion, Adult, Care After This sheet gives you information about how to care for yourself after your procedure. Your doctor may also give you more specific instructions. If you have problems or questions, contact your doctor. What can I expect after the procedure? After the procedure, it is common to have: Bruising and soreness at the IV site. A headache. Follow these instructions at home: Insertion site care   Follow instructions from your doctor about how to take care of your insertion site. This is where an IV tube was put into your vein. Make sure you: Wash your hands with soap and water before and after you change your bandage (dressing). If you cannot use soap and water, use hand sanitizer. Change your bandage as told by your doctor. Check your insertion site every day for signs of infection. Check for: Redness, swelling, or pain. Bleeding from the site. Warmth. Pus or a bad smell. General instructions Take over-the-counter and prescription medicines only as told by your doctor. Rest as told by your doctor. Go back to your normal activities as told by your doctor. Keep all follow-up visits as told by your doctor. This is important. Contact a doctor if: You have itching or red, swollen areas of skin (hives). You feel worried or nervous (anxious). You feel weak after doing your normal activities. You have redness, swelling, warmth, or pain around the insertion site. You have blood coming from the insertion site, and the blood does not stop with pressure. You have pus or a bad smell coming from the insertion site. Get help right away if: You have signs of a serious reaction. This may be coming from an allergy or the body's defense system (immune system). Signs include: Trouble breathing or shortness of breath. Swelling of the face or feeling warm (flushed). Fever or chills. Head, chest, or back pain. Dark pee (urine) or blood in the pee. Widespread rash. Fast  heartbeat. Feeling dizzy or light-headed. You may receive your blood transfusion in an outpatient setting. If so, you will be told whom to contact to report any reactions. These symptoms may be an emergency. Do not wait to see if the symptoms will go away. Get medical help right away. Call your local emergency services (911 in the [place] Do not drive yourself to the hospital. Summary Bruising and soreness at the IV site are common. Check your insertion site every day for signs of infection. Rest as told by your doctor. Go back to your normal activities as told by your doctor. Get help right away if you have signs of a serious reaction. This information is not intended to replace advice given to you by your health care provider. Make sure you discuss any questions you have with your health care provider. Document Revised: 08/24/2020 Document Reviewed: 10/22/2018 Elsevier Patient Education  2022 Elsevier Inc.  

## 2021-01-30 NOTE — Progress Notes (Signed)
Patient has worsening back problems that is being treated by pain clinic.  Still feeling lack of energy and unable to walk or stand very long.  Lower extremity edema has improved, 11 lb wt loss since last documented weight.

## 2021-01-31 LAB — KAPPA/LAMBDA LIGHT CHAINS
Kappa free light chain: 4.6 mg/L (ref 3.3–19.4)
Kappa, lambda light chain ratio: 0.59 (ref 0.26–1.65)
Lambda free light chains: 7.8 mg/L (ref 5.7–26.3)

## 2021-01-31 LAB — TYPE AND SCREEN
ABO/RH(D): B POS
Antibody Screen: NEGATIVE
Unit division: 0

## 2021-01-31 LAB — BPAM RBC
Blood Product Expiration Date: 202210012359
ISSUE DATE / TIME: 202209201220
Unit Type and Rh: 7300

## 2021-01-31 LAB — IGG, IGA, IGM
IgA: 1101 mg/dL — ABNORMAL HIGH (ref 64–422)
IgG (Immunoglobin G), Serum: 127 mg/dL — ABNORMAL LOW (ref 586–1602)
IgM (Immunoglobulin M), Srm: <5 mg/dL — ABNORMAL LOW (ref 26–217)

## 2021-02-01 LAB — PROTEIN ELECTROPHORESIS, SERUM
A/G Ratio: 1.3 (ref 0.7–1.7)
Albumin ELP: 3.8 g/dL (ref 2.9–4.4)
Alpha-1-Globulin: 0.2 g/dL (ref 0.0–0.4)
Alpha-2-Globulin: 0.8 g/dL (ref 0.4–1.0)
Beta Globulin: 1.6 g/dL — ABNORMAL HIGH (ref 0.7–1.3)
Gamma Globulin: 0.3 g/dL — ABNORMAL LOW (ref 0.4–1.8)
Globulin, Total: 2.9 g/dL (ref 2.2–3.9)
M-Spike, %: 0.6 g/dL — ABNORMAL HIGH
Total Protein ELP: 6.7 g/dL (ref 6.0–8.5)

## 2021-02-02 ENCOUNTER — Inpatient Hospital Stay: Payer: Medicare Other

## 2021-02-02 VITALS — BP 112/64 | HR 93 | Temp 97.5°F | Resp 18

## 2021-02-02 DIAGNOSIS — Z5111 Encounter for antineoplastic chemotherapy: Secondary | ICD-10-CM | POA: Diagnosis not present

## 2021-02-02 DIAGNOSIS — C9000 Multiple myeloma not having achieved remission: Secondary | ICD-10-CM

## 2021-02-02 MED ORDER — BORTEZOMIB CHEMO SQ INJECTION 3.5 MG (2.5MG/ML)
1.3000 mg/m2 | Freq: Once | INTRAMUSCULAR | Status: AC
  Administered 2021-02-02: 2.5 mg via SUBCUTANEOUS
  Filled 2021-02-02: qty 1

## 2021-02-02 MED ORDER — DEXAMETHASONE 4 MG PO TABS
20.0000 mg | ORAL_TABLET | Freq: Once | ORAL | Status: AC
  Administered 2021-02-02: 20 mg via ORAL
  Filled 2021-02-02: qty 5

## 2021-02-02 NOTE — Patient Instructions (Signed)
[facility]  Discharge Instructions: Thank you for choosing [facility] to provide your oncology and hematology care.  If you have a lab appointment with the [facility], please go directly to the [facility] and check in at the registration area.  Wear comfortable clothing and clothing appropriate for easy access to any Portacath or PICC line.   We strive to give you quality time with your provider. You may need to reschedule your appointment if you arrive late (15 or more minutes).  Arriving late affects you and other patients whose appointments are after yours.  Also, if you miss three or more appointments without notifying the office, you may be dismissed from the clinic at the provider's discretion.      For prescription refill requests, have your pharmacy contact our office and allow 72 hours for refills to be completed.      To help prevent nausea and vomiting after your treatment, we encourage you to take your nausea medication as directed.  BELOW ARE SYMPTOMS THAT SHOULD BE REPORTED IMMEDIATELY: *FEVER GREATER THAN 100.4 F (38 C) OR HIGHER *CHILLS OR SWEATING *NAUSEA AND VOMITING THAT IS NOT CONTROLLED WITH YOUR NAUSEA MEDICATION *UNUSUAL SHORTNESS OF BREATH *UNUSUAL BRUISING OR BLEEDING *URINARY PROBLEMS (pain or burning when urinating, or frequent urination) *BOWEL PROBLEMS (unusual diarrhea, constipation, pain near the anus) TENDERNESS IN MOUTH AND THROAT WITH OR WITHOUT PRESENCE OF ULCERS (sore throat, sores in mouth, or a toothache) UNUSUAL RASH, SWELLING OR PAIN  UNUSUAL VAGINAL DISCHARGE OR ITCHING   Items with * indicate a potential emergency and should be followed up as soon as possible or go to the Emergency Department if any problems should occur.  Please show the CHEMOTHERAPY ALERT CARD or IMMUNOTHERAPY ALERT CARD at check-in to the Emergency Department and triage nurse.  Should you have questions after your  visit or need to cancel or reschedule your appointment, please contact [facility]  [phone] and follow the prompts.  Office hours are 8:00 a.m. to 4:30 p.m. Monday - Friday. Please note that voicemails left after 4:00 p.m. may not be returned until the following business day.  We are closed weekends and major holidays. You have access to a nurse at all times for urgent questions. Please call the main number to the clinic [phone] and follow the prompts.  For any non-urgent questions, you may also contact your provider using MyChart. We now offer e-Visits for anyone 66 and older to request care online for non-urgent symptoms. For details visit [email or web address].   Also download the MyChart app! Go to the app store, search "MyChart", open the app, select [facility], and log in with your MyChart username and password.  Due to Covid, a mask is required upon entering the hospital/clinic. If you do not have a mask, one will be given to you upon arrival. For doctor visits, patients may have 1 support person aged 92 or older with them. For treatment visits, patients cannot have anyone with them due to current Covid guidelines and our immunocompromised population.

## 2021-02-03 NOTE — Progress Notes (Signed)
[place]  [phone] [phone]) [phone]  ID: Heather Peterson OB: February 05, 1941  MR#: [id number]  OXB#:[id number]  [name], MD as PCP - General (Family Medicine) Lloyd Huger, MD as Consulting Physician (Oncology)  CHIEF COMPLAINT: Multiple myeloma, subdural hematoma.  INTERVAL HISTORY: Patient returns to clinic today for further evaluation and consideration of cycle 6, day 8 of single agent Velcade.  She has increased constipation, abdominal bloating, and mild tenderness.  She also complains of increased weakness and fatigue.  Her peripheral edema and CHF symptoms have essentially resolved. She does not complain of any neuropathy today.  She has no neurologic complaints.  She has chronic shortness of breath and requires oxygen 24 hours/day.  She denies any fevers.  She has a fair appetite.  She denies any chest pain, cough, or hemoptysis. She denies any nausea, vomiting, constipation or diarrhea. She has no melena or hematochezia.  She has no urinary complaints.  Patient offers no further specific complaints today.  REVIEW OF SYSTEMS:   Review of Systems  Constitutional:  Positive for malaise/fatigue. Negative for fever.  Respiratory:  Positive for shortness of breath. Negative for cough and hemoptysis.   Cardiovascular: Negative.  Negative for chest pain and leg swelling.  Gastrointestinal:  Positive for constipation. Negative for abdominal pain, diarrhea, melena and nausea.  Genitourinary: Negative.  Negative for dysuria and hematuria.  Musculoskeletal:  Positive for back pain. Negative for joint pain.  Skin: Negative.  Negative for rash.  Neurological:  Positive for weakness. Negative for dizziness, sensory change, focal weakness and headaches.  Psychiatric/Behavioral: Negative.  The patient is not nervous/anxious.    As per HPI. Otherwise, a complete review of systems is negative.  PAST MEDICAL HISTORY: Past  Medical History:  Diagnosis Date   Anxiety    Chronic combined systolic and diastolic CHF (congestive heart failure) (HCC)    Chronic kidney disease    Coronary artery disease    Depression    Diabetes mellitus without complication (HCC)    Diabetes mellitus, type II ([place])    Hypertension    MI (myocardial infarction) ([place])    x 5   Pacemaker    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[place])    Prolonged Q-T interval on ECG    Thyroid disease     PAST SURGICAL HISTORY: Past Surgical History:  Procedure Laterality Date   CENTRAL LINE INSERTION  03/11/2017   Procedure: CENTRAL LINE INSERTION;  Surgeon: Leonie Man, MD;  Location: [facility];  Service: Cardiovascular;;   CHOLECYSTECTOMY     COLONOSCOPY WITH PROPOFOL N/A 09/01/2019   Procedure: COLONOSCOPY WITH PROPOFOL;  Surgeon: Toledo, Benay Pike, MD;  Location: [facility];  Service: Gastroenterology;  Laterality: N/A;   CORONARY STENT INTERVENTION W/IMPELLA N/A 03/11/2017   Procedure: Coronary Stent Intervention w/Impella;  Surgeon: Leonie Man, MD;  Location: [facility];  Service: Cardiovascular;  Laterality: N/A;   ESOPHAGOGASTRODUODENOSCOPY (EGD) WITH PROPOFOL N/A 09/01/2019   Procedure: ESOPHAGOGASTRODUODENOSCOPY (EGD) WITH PROPOFOL;  Surgeon: Toledo, Benay Pike, MD;  Location: [facility];  Service: Gastroenterology;  Laterality: N/A;   ESOPHAGOGASTRODUODENOSCOPY (EGD) WITH PROPOFOL N/A 09/08/2019   Procedure: ESOPHAGOGASTRODUODENOSCOPY (EGD) WITH PROPOFOL;  Surgeon: [name], MD;  Location: [facility];  Service: Gastroenterology;  Laterality: N/A;   EYE SURGERY     HIP ARTHROPLASTY Right 03/29/2020   Procedure: ARTHROPLASTY BIPOLAR HIP (HEMIARTHROPLASTY);  Surgeon: Corky Mull, MD;  Location: [facility];  Service: Orthopedics;  Laterality: Right;   INTRAVASCULAR PRESSURE WIRE/FFR STUDY N/A 03/11/2017   Procedure: INTRAVASCULAR PRESSURE WIRE/FFR STUDY;  Surgeon: Leonie Man, MD;  Location: [place] [facility];  Service: Cardiovascular;  Laterality: N/A;   INTRAVASCULAR ULTRASOUND/IVUS N/A 03/11/2017   Procedure: Intravascular Ultrasound/IVUS;  Surgeon: Leonie Man, MD;  Location: [place] [facility];  Service: Cardiovascular;  Laterality: N/A;   LEFT HEART CATH AND CORONARY ANGIOGRAPHY N/A 03/05/2017   Procedure: LEFT HEART CATH AND CORONARY ANGIOGRAPHY;  Surgeon: Isaias Cowman, MD;  Location: [facility];  Service: Cardiovascular;  Laterality: N/A;   PACEMAKER IMPLANT     pacemaker/defibrillator Left     FAMILY HISTORY: Family History  Problem Relation Age of Onset   Hypertension Father    Heart attack Father    Depression Sister    Depression Brother    Depression Brother     ADVANCED DIRECTIVES (Y/N):  N  HEALTH MAINTENANCE: Social History   Tobacco Use   Smoking status: Every Day    Packs/day: 0.25    Types: E-cigarettes, Cigarettes   Smokeless tobacco: Never  Vaping Use   Vaping Use: Former  Substance Use Topics   Alcohol use: Not Currently    Comment: occasionally   Drug use: Yes    Comment: prescribed pain meds     Colonoscopy:  PAP:  Bone density:  Lipid panel:  Allergies  Allergen Reactions   Celebrex [Celecoxib] Anaphylaxis   Glipizide Anaphylaxis   Levaquin [Levofloxacin In D5w] Other (See Comments)    Heart arrhthymias   Levofloxacin Other (See Comments) and Palpitations    ICD fired   Lisinopril Swelling    Lip and facial swelling   Sulfa Antibiotics Other (See Comments) and Anaphylaxis    Reaction: unknown   Metformin Other (See Comments)    Gi tolerance    Penicillins Rash and Other (See Comments)    Has patient had a PCN reaction causing immediate rash, facial/tongue/throat swelling, SOB or lightheadedness with hypotension: Unknown Has patient had a PCN reaction causing severe rash involving mucus membranes or skin necrosis: No Has patient had a PCN reaction that required hospitalization: No Has patient had a  PCN reaction occurring within the last 10 years: No If all of the above answers are "NO", then may proceed with Cephalosporin use.     Current Outpatient Medications  Medication Sig Dispense Refill   allopurinol (ZYLOPRIM) 100 MG tablet Take 50 mg by mouth daily.     aspirin EC 81 MG tablet Take 81 mg by mouth at bedtime.      dexamethasone (DECADRON) 4 MG tablet Take 5 tablets (20 mg total) by mouth daily. Take the day after Velcade on days 2,5,9,12. Take with breakfast 40 tablet 3   docusate sodium (COLACE) 100 MG capsule Take 100 mg by mouth 2 (two) times daily.     Ensure Max Protein (ENSURE MAX PROTEIN) LIQD Take 330 mLs (11 oz total) by mouth 2 (two) times daily between meals.     fexofenadine (ALLEGRA) 180 MG tablet Take 180 mg by mouth daily.     FLUoxetine (PROZAC) 10 MG capsule Take 10 mg by mouth daily.     gabapentin (NEURONTIN) 100 MG capsule TAKE 1 CAPSULE(100 MG) BY MOUTH THREE TIMES DAILY (Patient taking differently: Patient reports 100 mg QAM, 700 mg QHS) 270 capsule 1   gabapentin (NEURONTIN) 300 MG capsule Take 328m in AM, 3081min afternoon, 90061mt night 150 capsule 3   gabapentin (NEURONTIN)  600 MG tablet Take 600 mg by mouth daily. Patient takes 100 mg QAM, 700 mg QHS     HYDROcodone-acetaminophen (NORCO/VICODIN) 5-325 MG tablet      hydrocortisone (ANUSOL-HC) 2.5 % rectal cream Apply 1 application topically 2 (two) times daily.     Lactulose 20 GM/30ML SOLN Take 30 mLs (20 g total) by mouth in the morning and at bedtime. 450 mL 1   levothyroxine (SYNTHROID) 50 MCG tablet Take by mouth.     levothyroxine (SYNTHROID) 75 MCG tablet Take 1 tablet (75 mcg total) by mouth daily before breakfast. On Monday, Wednesday and Friday     magnesium oxide (MAG-OX) 400 MG tablet Take 200 mg by mouth daily.     mexiletine (MEXITIL) 200 MG capsule Take 1 capsule (200 mg total) by mouth every 12 (twelve) hours. 60 capsule 1   midodrine (PROAMATINE) 10 MG tablet Take 1 tablet (10 mg  total) by mouth 2 (two) times daily with a meal.     OXYGEN Inhale 2 L into the lungs continuous.     pantoprazole (PROTONIX) 40 MG tablet Take 40 mg by mouth daily.      polyethylene glycol (MIRALAX) 17 g packet Take 17 g by mouth daily. 14 each 0   potassium chloride SA (KLOR-CON) 20 MEQ tablet Take 20 mEq by mouth daily.     Prenatal Vit-Fe Fumarate-FA (PRENATAL MULTIVITAMIN) TABS tablet Take 1 tablet by mouth daily.     Probiotic Product (PROBIOTIC DAILY PO) Take 1 tablet by mouth daily.     SANTYL ointment Apply topically.     simvastatin (ZOCOR) 40 MG tablet Take 20 mg by mouth at bedtime.   2   spironolactone (ALDACTONE) 25 MG tablet Take 0.5 tablets (12.5 mg total) by mouth every other day. (Patient taking differently: Take 12.5 mg by mouth daily.) 15 tablet 3   torsemide (DEMADEX) 20 MG tablet Take 40 mg by mouth daily.     traZODone (DESYREL) 50 MG tablet Take 50 mg by mouth at bedtime.     zolpidem (AMBIEN) 5 MG tablet Take 1 tablet (5 mg total) by mouth at bedtime as needed for sleep. 30 tablet 0   No current facility-administered medications for this visit.    OBJECTIVE: Vitals:   02/06/21 0922  BP: 110/65  Pulse: 89  Resp: 18  Temp: (!) 97.3 F (36.3 C)  SpO2: 98%     Body mass index is 27.94 kg/m.    ECOG FS:1 - Symptomatic but completely ambulatory  General: Well-developed, well-nourished, no acute distress.  Sitting in a wheelchair. Eyes: Pink conjunctiva, anicteric sclera. HEENT: Normocephalic, moist mucous membranes. Lungs: No audible wheezing or coughing. Heart: Regular rate and rhythm. Abdomen: Soft, nontender, no obvious distention. Musculoskeletal: No edema, cyanosis, or clubbing. Neuro: Alert, answering all questions appropriately. Cranial nerves grossly intact. Skin: No rashes or petechiae noted. Psych: Normal affect.   LAB RESULTS:  Lab Results  Component Value Date   NA 136 02/06/2021   K 4.1 02/06/2021   CL 94 (L) 02/06/2021   CO2 30  02/06/2021   GLUCOSE 157 (H) 02/06/2021   BUN 79 (H) 02/06/2021   CREATININE 1.63 (H) 02/06/2021   CALCIUM 8.8 (L) 02/06/2021   PROT 7.1 02/06/2021   ALBUMIN 4.1 02/06/2021   AST 19 02/06/2021   ALT 21 02/06/2021   ALKPHOS 91 02/06/2021   BILITOT 0.7 02/06/2021   GFRNONAA 32 (L) 02/06/2021   GFRAA 30 (L) 09/09/2019    Lab Results  Component Value Date   WBC 10.2 02/06/2021   NEUTROABS 7.1 02/06/2021   HGB 8.3 (L) 02/06/2021   HCT 27.7 (L) 02/06/2021   MCV 87.7 02/06/2021   PLT 190 02/06/2021   Lab Results  Component Value Date   IRON 85 05/23/2020   TIBC 421 05/23/2020   IRONPCTSAT 20 05/23/2020   Lab Results  Component Value Date   FERRITIN 50 05/23/2020     STUDIES: No results found.  ASSESSMENT: Stage II multiple myeloma, fractured right hip.  PLAN:    1.  Stage II multiple myeloma: Bone marrow biopsy confirmed diagnosis with plasma cells up to 90% of biopsy.  Cytogenetics are reported as normal.  Metastatic bone survey from March 03, 2020 reviewed independently with multiple skeletal lucencies consistent with myeloma.  Patient's most recent M spike on January 09, 2021 increased slightly to 0.6 with an IgA immunoglobulin level of 966.  IgG and IgM are chronically suppressed. Kappa and lambda free light chains continue to be within normal limits. Initial plan was to give Velcade on days 1, 4, 8, and 11 along with 10 mg Revlimid on days 1 through 14. Patient will also receive weekly 20 mg dexamethasone.  Revlimid was discontinued secondary to intolerance.  Patient did not receive treatment between June 20, 2020 and November 14, 2020.  Patient then had a second temporary discontinue treatment between December 12, 2020 and January 30, 2021.  Proceed with cycle 6, day 8 of treatment today.  Return to clinic Friday for treatment only and then in 2 weeks for further evaluation and consideration of cycle 7, day 1.   2.  Anemia: Improved to 8.3 with 1 unit of packed red blood  cells.  Patient does not need an additional transfusion today.  Likely multifactorial with chronic renal insufficiency, underlying myeloma, as well as history of recent GI bleed.  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   3.  Chronic renal insufficiency: Creatinine slightly improved to 1.63. 4.  Ascites/peripheral edema: Resolved.  Continue follow-up with heart failure clinic. 5.  Angiodysplastic lesions: Continue follow-up with GI as scheduled. 6. Constipation: Significantly worse per patient.  She reports she started taking lactulose last night.  Monitor. 7.  Right hip fracture: Continue rehab and follow-up with orthopedics as scheduled. Continue Percocet as needed. 8. Thrombocytopenia: Resolved. 9.  Peripheral neuropathy: Chronic and unchanged.  Continue 700 mg gabapentin at night in addition to 100 mg in the morning and in the afternoon.    Patient expressed understanding and was in agreement with this plan. She also understands that She can call clinic at any time with any questions, concerns, or complaints.    Lloyd Huger, MD   02/06/2021 9:54 AM

## 2021-02-05 ENCOUNTER — Ambulatory Visit: Payer: Medicare Other | Admitting: Family

## 2021-02-06 ENCOUNTER — Inpatient Hospital Stay: Payer: Medicare Other

## 2021-02-06 ENCOUNTER — Inpatient Hospital Stay (HOSPITAL_BASED_OUTPATIENT_CLINIC_OR_DEPARTMENT_OTHER): Payer: Medicare Other | Admitting: Oncology

## 2021-02-06 VITALS — BP 110/65 | HR 89 | Temp 97.3°F | Resp 18 | Wt 157.7 lb

## 2021-02-06 DIAGNOSIS — Z5111 Encounter for antineoplastic chemotherapy: Secondary | ICD-10-CM | POA: Diagnosis not present

## 2021-02-06 DIAGNOSIS — C9000 Multiple myeloma not having achieved remission: Secondary | ICD-10-CM | POA: Diagnosis not present

## 2021-02-06 LAB — CBC WITH DIFFERENTIAL/PLATELET
Abs Immature Granulocytes: 0.56 10*3/uL — ABNORMAL HIGH (ref 0.00–0.07)
Basophils Absolute: 0 10*3/uL (ref 0.0–0.1)
Basophils Relative: 0 %
Eosinophils Absolute: 0.1 10*3/uL (ref 0.0–0.5)
Eosinophils Relative: 1 %
HCT: 27.7 % — ABNORMAL LOW (ref 36.0–46.0)
Hemoglobin: 8.3 g/dL — ABNORMAL LOW (ref 12.0–15.0)
Immature Granulocytes: 6 %
Lymphocytes Relative: 12 %
Lymphs Abs: 1.2 10*3/uL (ref 0.7–4.0)
MCH: 26.3 pg (ref 26.0–34.0)
MCHC: 30 g/dL (ref 30.0–36.0)
MCV: 87.7 fL (ref 80.0–100.0)
Monocytes Absolute: 1.2 10*3/uL — ABNORMAL HIGH (ref 0.1–1.0)
Monocytes Relative: 12 %
Neutro Abs: 7.1 10*3/uL (ref 1.7–7.7)
Neutrophils Relative %: 69 %
Platelets: 190 10*3/uL (ref 150–400)
RBC: 3.16 MIL/uL — ABNORMAL LOW (ref 3.87–5.11)
RDW: 18.6 % — ABNORMAL HIGH (ref 11.5–15.5)
WBC: 10.2 10*3/uL (ref 4.0–10.5)
nRBC: 0.9 % — ABNORMAL HIGH (ref 0.0–0.2)

## 2021-02-06 LAB — COMPREHENSIVE METABOLIC PANEL
ALT: 21 U/L (ref 0–44)
AST: 19 U/L (ref 15–41)
Albumin: 4.1 g/dL (ref 3.5–5.0)
Alkaline Phosphatase: 91 U/L (ref 38–126)
Anion gap: 12 (ref 5–15)
BUN: 79 mg/dL — ABNORMAL HIGH (ref 8–23)
CO2: 30 mmol/L (ref 22–32)
Calcium: 8.8 mg/dL — ABNORMAL LOW (ref 8.9–10.3)
Chloride: 94 mmol/L — ABNORMAL LOW (ref 98–111)
Creatinine, Ser: 1.63 mg/dL — ABNORMAL HIGH (ref 0.44–1.00)
GFR, Estimated: 32 mL/min — ABNORMAL LOW (ref >60)
Glucose, Bld: 157 mg/dL — ABNORMAL HIGH (ref 70–99)
Potassium: 4.1 mmol/L (ref 3.5–5.1)
Sodium: 136 mmol/L (ref 135–145)
Total Bilirubin: 0.7 mg/dL (ref 0.3–1.2)
Total Protein: 7.1 g/dL (ref 6.5–8.1)

## 2021-02-06 LAB — SAMPLE TO BLOOD BANK

## 2021-02-06 MED ORDER — DEXAMETHASONE 4 MG PO TABS
20.0000 mg | ORAL_TABLET | Freq: Once | ORAL | Status: AC
  Administered 2021-02-06: 20 mg via ORAL
  Filled 2021-02-06: qty 5

## 2021-02-06 MED ORDER — BORTEZOMIB CHEMO SQ INJECTION 3.5 MG (2.5MG/ML)
1.3000 mg/m2 | Freq: Once | INTRAMUSCULAR | Status: AC
  Administered 2021-02-06: 2.5 mg via SUBCUTANEOUS
  Filled 2021-02-06: qty 1

## 2021-02-06 NOTE — Progress Notes (Signed)
Pt c/o cramps in right hand x 1-2 weeks, bloating and -abdominal pain.

## 2021-02-06 NOTE — Progress Notes (Signed)
Creatinine 1.63 today. Per Dr Grayland Ormond okay to proceed with treatment

## 2021-02-06 NOTE — Patient Instructions (Signed)
[facility]  Discharge Instructions: Thank you for choosing [facility] to provide your oncology and hematology care.  If you have a lab appointment with the [facility], please go directly to the [facility] and check in at the registration area.  Wear comfortable clothing and clothing appropriate for easy access to any Portacath or PICC line.   We strive to give you quality time with your provider. You may need to reschedule your appointment if you arrive late (15 or more minutes).  Arriving late affects you and other patients whose appointments are after yours.  Also, if you miss three or more appointments without notifying the office, you may be dismissed from the clinic at the provider's discretion.      For prescription refill requests, have your pharmacy contact our office and allow 72 hours for refills to be completed.    Today you received the following chemotherapy and/or immunotherapy agents: Velcade      To help prevent nausea and vomiting after your treatment, we encourage you to take your nausea medication as directed.  BELOW ARE SYMPTOMS THAT SHOULD BE REPORTED IMMEDIATELY: *FEVER GREATER THAN 100.4 F (38 C) OR HIGHER *CHILLS OR SWEATING *NAUSEA AND VOMITING THAT IS NOT CONTROLLED WITH YOUR NAUSEA MEDICATION *UNUSUAL SHORTNESS OF BREATH *UNUSUAL BRUISING OR BLEEDING *URINARY PROBLEMS (pain or burning when urinating, or frequent urination) *BOWEL PROBLEMS (unusual diarrhea, constipation, pain near the anus) TENDERNESS IN MOUTH AND THROAT WITH OR WITHOUT PRESENCE OF ULCERS (sore throat, sores in mouth, or a toothache) UNUSUAL RASH, SWELLING OR PAIN  UNUSUAL VAGINAL DISCHARGE OR ITCHING   Items with * indicate a potential emergency and should be followed up as soon as possible or go to the Emergency Department if any problems should occur.  Please show the CHEMOTHERAPY ALERT CARD or IMMUNOTHERAPY ALERT CARD at check-in to  the Emergency Department and triage nurse.  Should you have questions after your visit or need to cancel or reschedule your appointment, please contact [facility]  [phone] and follow the prompts.  Office hours are 8:00 a.m. to 4:30 p.m. Monday - Friday. Please note that voicemails left after 4:00 p.m. may not be returned until the following business day.  We are closed weekends and major holidays. You have access to a nurse at all times for urgent questions. Please call the main number to the clinic [phone] and follow the prompts.  For any non-urgent questions, you may also contact your provider using MyChart. We now offer e-Visits for anyone 72 and older to request care online for non-urgent symptoms. For details visit [email or web address].   Also download the MyChart app! Go to the app store, search "MyChart", open the app, select [facility], and log in with your MyChart username and password.  Due to Covid, a mask is required upon entering the hospital/clinic. If you do not have a mask, one will be given to you upon arrival. For doctor visits, patients may have 1 support person aged 28 or older with them. For treatment visits, patients cannot have anyone with them due to current Covid guidelines and our immunocompromised population. Bortezomib injection What is this medication? BORTEZOMIB (bor TEZ oh mib) targets proteins in cancer cells and stops the cancer cells from growing. It treats multiple myeloma and mantle cell lymphoma. This medicine may be used for other purposes; ask your health care provider or pharmacist if you have questions. COMMON BRAND NAME(S): Velcade What should I  tell my care team before I take this medication? They need to know if you have any of these conditions: dehydration diabetes (high blood sugar) heart disease liver disease tingling of the fingers or toes or other nerve disorder an unusual or allergic reaction to  bortezomib, mannitol, boron, other medicines, foods, dyes, or preservatives pregnant or trying to get pregnant breast-feeding How should I use this medication? This medicine is injected into a vein or under the skin. It is given by a health care provider in a hospital or clinic setting. Talk to your health care provider about the use of this medicine in children. Special care may be needed. Overdosage: If you think you have taken too much of this medicine contact a poison control center or emergency room at once. NOTE: This medicine is only for you. Do not share this medicine with others. What if I miss a dose? Keep appointments for follow-up doses. It is important not to miss your dose. Call your health care provider if you are unable to keep an appointment. What may interact with this medication? This medicine may interact with the following medications: ketoconazole rifampin This list may not describe all possible interactions. Give your health care provider a list of all the medicines, herbs, non-prescription drugs, or dietary supplements you use. Also tell them if you smoke, drink alcohol, or use illegal drugs. Some items may interact with your medicine. What should I watch for while using this medication? Your condition will be monitored carefully while you are receiving this medicine. You may need blood work done while you are taking this medicine. You may get drowsy or dizzy. Do not drive, use machinery, or do anything that needs mental alertness until you know how this medicine affects you. Do not stand up or sit up quickly, especially if you are an older patient. This reduces the risk of dizzy or fainting spells This medicine may increase your risk of getting an infection. Call your health care provider for advice if you get a fever, chills, sore throat, or other symptoms of a cold or flu. Do not treat yourself. Try to avoid being around people who are sick. Check with your health care  provider if you have severe diarrhea, nausea, and vomiting, or if you sweat a lot. The loss of too much body fluid may make it dangerous for you to take this medicine. Do not become pregnant while taking this medicine or for 7 months after stopping it. Women should inform their health care provider if they wish to become pregnant or think they might be pregnant. Men should not father a child while taking this medicine and for 4 months after stopping it. There is a potential for serious harm to an unborn child. Talk to your health care provider for more information. Do not breast-feed an infant while taking this medicine or for 2 months after stopping it. This medicine may make it more difficult to get pregnant or father a child. Talk to your health care provider if you are concerned about your fertility. What side effects may I notice from receiving this medication? Side effects that you should report to your doctor or health care professional as soon as possible: allergic reactions (skin rash; itching or hives; swelling of the face, lips, or tongue) bleeding (bloody or black, tarry stools; red or dark brown urine; spitting up blood or brown material that looks like coffee grounds; red spots on the skin; unusual bruising or bleeding from the eye, gums, or  nose) blurred vision or changes in vision confusion constipation headache heart failure (trouble breathing; fast, irregular heartbeat; sudden weight gain; swelling of the ankles, feet, hands) infection (fever, chills, cough, sore throat, pain or trouble passing urine) lack or loss of appetite liver injury (dark yellow or brown urine; general ill feeling or flu-like symptoms; loss of appetite, right upper belly pain; yellowing of the eyes or skin) low blood pressure (dizziness; feeling faint or lightheaded, falls; unusually weak or tired) muscle cramps pain, redness, or irritation at site where injected pain, tingling, numbness in the hands or  feet seizures trouble breathing unusual bruising or bleeding Side effects that usually do not require medical attention (report to your doctor or health care professional if they continue or are bothersome): diarrhea nausea stomach pain trouble sleeping vomiting This list may not describe all possible side effects. Call your doctor for medical advice about side effects. You may report side effects to FDA at 1-800-FDA-1088. Where should I keep my medication? This medicine is given in a hospital or clinic. It will not be stored at home. NOTE: This sheet is a summary. It may not cover all possible information. If you have questions about this medicine, talk to your doctor, pharmacist, or health care provider.  2022 [employer]vier/Gold Standard (2020-04-20 13:22:53)  

## 2021-02-07 ENCOUNTER — Ambulatory Visit: Payer: Medicare Other | Attending: Family | Admitting: Family

## 2021-02-07 ENCOUNTER — Other Ambulatory Visit: Payer: Self-pay

## 2021-02-07 ENCOUNTER — Encounter: Payer: Self-pay | Admitting: Family

## 2021-02-07 VITALS — BP 120/75 | HR 89 | Resp 18 | Ht 63.0 in | Wt 157.2 lb

## 2021-02-07 DIAGNOSIS — Z955 Presence of coronary angioplasty implant and graft: Secondary | ICD-10-CM | POA: Insufficient documentation

## 2021-02-07 DIAGNOSIS — Z95810 Presence of automatic (implantable) cardiac defibrillator: Secondary | ICD-10-CM | POA: Insufficient documentation

## 2021-02-07 DIAGNOSIS — Z79899 Other long term (current) drug therapy: Secondary | ICD-10-CM | POA: Insufficient documentation

## 2021-02-07 DIAGNOSIS — Z888 Allergy status to other drugs, medicaments and biological substances status: Secondary | ICD-10-CM | POA: Insufficient documentation

## 2021-02-07 DIAGNOSIS — I130 Hypertensive heart and chronic kidney disease with heart failure and stage 1 through stage 4 chronic kidney disease, or unspecified chronic kidney disease: Secondary | ICD-10-CM | POA: Insufficient documentation

## 2021-02-07 DIAGNOSIS — Z886 Allergy status to analgesic agent status: Secondary | ICD-10-CM | POA: Insufficient documentation

## 2021-02-07 DIAGNOSIS — Z882 Allergy status to sulfonamides status: Secondary | ICD-10-CM | POA: Insufficient documentation

## 2021-02-07 DIAGNOSIS — I2510 Atherosclerotic heart disease of native coronary artery without angina pectoris: Secondary | ICD-10-CM | POA: Diagnosis not present

## 2021-02-07 DIAGNOSIS — N184 Chronic kidney disease, stage 4 (severe): Secondary | ICD-10-CM

## 2021-02-07 DIAGNOSIS — C9000 Multiple myeloma not having achieved remission: Secondary | ICD-10-CM | POA: Insufficient documentation

## 2021-02-07 DIAGNOSIS — Z881 Allergy status to other antibiotic agents status: Secondary | ICD-10-CM | POA: Insufficient documentation

## 2021-02-07 DIAGNOSIS — Z794 Long term (current) use of insulin: Secondary | ICD-10-CM

## 2021-02-07 DIAGNOSIS — E079 Disorder of thyroid, unspecified: Secondary | ICD-10-CM | POA: Insufficient documentation

## 2021-02-07 DIAGNOSIS — E1122 Type 2 diabetes mellitus with diabetic chronic kidney disease: Secondary | ICD-10-CM | POA: Diagnosis not present

## 2021-02-07 DIAGNOSIS — Z79890 Hormone replacement therapy (postmenopausal): Secondary | ICD-10-CM | POA: Insufficient documentation

## 2021-02-07 DIAGNOSIS — I10 Essential (primary) hypertension: Secondary | ICD-10-CM

## 2021-02-07 DIAGNOSIS — N189 Chronic kidney disease, unspecified: Secondary | ICD-10-CM | POA: Insufficient documentation

## 2021-02-07 DIAGNOSIS — Z8249 Family history of ischemic heart disease and other diseases of the circulatory system: Secondary | ICD-10-CM | POA: Diagnosis not present

## 2021-02-07 DIAGNOSIS — Z7982 Long term (current) use of aspirin: Secondary | ICD-10-CM | POA: Diagnosis not present

## 2021-02-07 DIAGNOSIS — I5022 Chronic systolic (congestive) heart failure: Secondary | ICD-10-CM | POA: Diagnosis present

## 2021-02-07 DIAGNOSIS — Z9981 Dependence on supplemental oxygen: Secondary | ICD-10-CM | POA: Insufficient documentation

## 2021-02-07 DIAGNOSIS — Z880 Allergy status to penicillin: Secondary | ICD-10-CM | POA: Diagnosis not present

## 2021-02-07 LAB — IGG, IGA, IGM
IgA: 976 mg/dL — ABNORMAL HIGH (ref 64–422)
IgG (Immunoglobin G), Serum: 141 mg/dL — ABNORMAL LOW (ref 586–1602)
IgM (Immunoglobulin M), Srm: <5 mg/dL — ABNORMAL LOW (ref 26–217)

## 2021-02-07 LAB — KAPPA/LAMBDA LIGHT CHAINS
Kappa free light chain: 3.4 mg/L (ref 3.3–19.4)
Kappa, lambda light chain ratio: 0.89 (ref 0.26–1.65)
Lambda free light chains: 3.8 mg/L — ABNORMAL LOW (ref 5.7–26.3)

## 2021-02-07 NOTE — Patient Instructions (Signed)
Prop your legs up when you are sitting still   Continue to adhere to low salt diet  If you gain 2 lbs overnight or 5 lbs in a week, or legs become swollen again, call our office...  Return in 3 months.

## 2021-02-07 NOTE — Progress Notes (Signed)
Patient ID: Heather Peterson, female    DOB: Oct 07, 1940, 80 y.o.   MRN: [id number]  Heather Peterson is a 80 y/o female with a history of CAD, DM, HTN, CKD, thyroid disease, anxiety, depression, prolonged QT, current tobacco use and chronic heart failure.   Echo report from 06/27/20 reviewed and showed an EF of 25% along with moderate LAE. Echo report from 04/20/2019 reviewed and showed an EF of 40% along with mild TR and moderate MR.  Catheterization done 03/11/17 showed: Ost LM lesion, 50 %stenosed. - Eccentric Heavily Calcified lesion - MLA 7.5 mm2, FFR 0.93 - Not physiologically significant. Prox LAD to Dist LAD Stented Segment, 0 %stenosed. Dist LAD lesion, 90 %stenosed. Beyond Stent - Med Rx. Prox Cx to Mid Cx Stent, 0 %stenosed. Ost 2nd Mrg to 2nd Mrg lesion, 50 %stenosed. Beyond Stent  Was in the ED 12/15/20 due to a mechanical fall resulting in hitting her head on a cabinet. Evaluated and released.   She presents today for a follow-up visit for her heart failure. She presents with a chief complaint of moderate shortness of breath upon minimal exertion. She describes this as chronic in nature having been present for several years. She has associated fatigue, cough, pedal edema, abdominal distention, dizziness, leg weakness and difficulty sleeping along with this. She denies any chest pain or palpitations.   She had an episode of increased pedal edema and abdominal edema, saw her oncologist the same day and reports she was given something that helped with her HF symptoms.   Past Medical History:  Diagnosis Date   Anxiety    Chronic combined systolic and diastolic CHF (congestive heart failure) (HCC)    Chronic kidney disease    Coronary artery disease    Depression    Diabetes mellitus without complication (HCC)    Diabetes mellitus, type II ([place])    Hypertension    MI (myocardial infarction) ([place])    x 5   Pacemaker    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HCC)    Prolonged Q-T interval  on ECG    Thyroid disease    Past Surgical History:  Procedure Laterality Date   CENTRAL LINE INSERTION  03/11/2017   Procedure: CENTRAL LINE INSERTION;  Surgeon: Leonie Man, MD;  Location: [facility];  Service: Cardiovascular;;   CHOLECYSTECTOMY     COLONOSCOPY WITH PROPOFOL N/A 09/01/2019   Procedure: COLONOSCOPY WITH PROPOFOL;  Surgeon: Toledo, Benay Pike, MD;  Location: [facility];  Service: Gastroenterology;  Laterality: N/A;   CORONARY STENT INTERVENTION W/IMPELLA N/A 03/11/2017   Procedure: Coronary Stent Intervention w/Impella;  Surgeon: Leonie Man, MD;  Location: [facility];  Service: Cardiovascular;  Laterality: N/A;   ESOPHAGOGASTRODUODENOSCOPY (EGD) WITH PROPOFOL N/A 09/01/2019   Procedure: ESOPHAGOGASTRODUODENOSCOPY (EGD) WITH PROPOFOL;  Surgeon: Toledo, Benay Pike, MD;  Location: [facility];  Service: Gastroenterology;  Laterality: N/A;   ESOPHAGOGASTRODUODENOSCOPY (EGD) WITH PROPOFOL N/A 09/08/2019   Procedure: ESOPHAGOGASTRODUODENOSCOPY (EGD) WITH PROPOFOL;  Surgeon: [name], MD;  Location: [facility];  Service: Gastroenterology;  Laterality: N/A;   EYE SURGERY     HIP ARTHROPLASTY Right 03/29/2020   Procedure: ARTHROPLASTY BIPOLAR HIP (HEMIARTHROPLASTY);  Surgeon: Corky Mull, MD;  Location: [facility];  Service: Orthopedics;  Laterality: Right;   INTRAVASCULAR PRESSURE WIRE/FFR STUDY N/A 03/11/2017   Procedure: INTRAVASCULAR PRESSURE WIRE/FFR STUDY;  Surgeon: Leonie Man, MD;  Location: [facility];  Service: Cardiovascular;  Laterality: N/A;   INTRAVASCULAR ULTRASOUND/IVUS N/A  03/11/2017   Procedure: Intravascular Ultrasound/IVUS;  Surgeon: Leonie Man, MD;  Location: [facility];  Service: Cardiovascular;  Laterality: N/A;   LEFT HEART CATH AND CORONARY ANGIOGRAPHY N/A 03/05/2017   Procedure: LEFT HEART CATH AND CORONARY ANGIOGRAPHY;  Surgeon: Isaias Cowman, MD;  Location: [facility];  Service:  Cardiovascular;  Laterality: N/A;   PACEMAKER IMPLANT     pacemaker/defibrillator Left    Family History  Problem Relation Age of Onset   Hypertension Father    Heart attack Father    Depression Sister    Depression Brother    Depression Brother    Social History   Tobacco Use   Smoking status: Every Day    Packs/day: 0.25    Types: E-cigarettes, Cigarettes   Smokeless tobacco: Never  Substance Use Topics   Alcohol use: Not Currently    Comment: occasionally   Allergies  Allergen Reactions   Celebrex [Celecoxib] Anaphylaxis   Glipizide Anaphylaxis   Levaquin [Levofloxacin In D5w] Other (See Comments)    Heart arrhthymias   Levofloxacin Other (See Comments) and Palpitations    ICD fired   Lisinopril Swelling    Lip and facial swelling   Sulfa Antibiotics Other (See Comments) and Anaphylaxis    Reaction: unknown   Metformin Other (See Comments)    Gi tolerance    Penicillins Rash and Other (See Comments)    Has patient had a PCN reaction causing immediate rash, facial/tongue/throat swelling, SOB or lightheadedness with hypotension: Unknown Has patient had a PCN reaction causing severe rash involving mucus membranes or skin necrosis: No Has patient had a PCN reaction that required hospitalization: No Has patient had a PCN reaction occurring within the last 10 years: No If all of the above answers are "NO", then may proceed with Cephalosporin use.    Prior to Admission medications   Medication Sig Start Date End Date Taking? Authorizing Provider  allopurinol (ZYLOPRIM) 100 MG tablet Take 50 mg by mouth daily.   Yes [provider]  aspirin EC 81 MG tablet Take 81 mg by mouth at bedtime.    Yes [provider]  dexamethasone (DECADRON) 4 MG tablet Take 5 tablets (20 mg total) by mouth daily. Take the day after Velcade on days 2,5,9,12. Take with breakfast 11/14/20  Yes Heather Huger, MD  docusate sodium (COLACE) 100 MG capsule Take 100 mg by mouth 2  (two) times daily.   Yes [provider]  Ensure Max Protein (ENSURE MAX PROTEIN) LIQD Take 330 mLs (11 oz total) by mouth 2 (two) times daily between meals. 04/03/20  Yes Heather Nimrod, MD  fexofenadine (ALLEGRA) 180 MG tablet Take 180 mg by mouth daily.   Yes [provider]  FLUoxetine (PROZAC) 10 MG capsule Take 10 mg by mouth daily. 06/29/19  Yes [provider]  gabapentin (NEURONTIN) 100 MG capsule TAKE 1 CAPSULE(100 MG) BY MOUTH THREE TIMES DAILY 12/29/20  Yes Heather Huger, MD  gabapentin (NEURONTIN) 300 MG capsule Take 37m in AM, 3052min afternoon, 90070mt night Patient taking differently: Patient takes 200m59m the morning and 900mg88mnight. 10/06/20  Yes Vaslow, ZachaAcey Lav gabapentin (NEURONTIN) 600 MG tablet Take 600 mg by mouth daily. Patient takes 200mg 108mhe morning and 900mg a22mght. 11/07/20  Yes [provider]  hydrocortisone (ANUSOL-HC) 2.5 % rectal cream Apply 1 application topically 2 (two) times daily. 02/12/20  Yes [provider]  Lactulose 20 GM/30ML  SOLN Take 30 mLs (20 g total) by mouth in the morning and at bedtime. 06/23/20  Yes Jacquelin Hawking, NP  levothyroxine (SYNTHROID) 50 MCG tablet Take by mouth. 08/14/20  Yes [provider]  levothyroxine (SYNTHROID) 75 MCG tablet Take 1 tablet (75 mcg total) by mouth daily before breakfast. On Monday, Wednesday and Friday 04/03/20  Yes Heather Nimrod, MD  magnesium oxide (MAG-OX) 400 MG tablet Take 200 mg by mouth daily.   Yes [provider]  mexiletine (MEXITIL) 200 MG capsule Take 1 capsule (200 mg total) by mouth every 12 (twelve) hours. 03/14/17  Yes Seiler, Amber K, NP  midodrine (PROAMATINE) 10 MG tablet Take 1 tablet (10 mg total) by mouth 2 (two) times daily with a meal. 04/03/20  Yes Heather Nimrod, MD  OXYGEN Inhale 2 L into the lungs continuous.   Yes [provider]  pantoprazole (PROTONIX) 40 MG tablet Take 40 mg by mouth daily.   05/16/19  Yes [provider]  polyethylene glycol (MIRALAX) 17 g packet Take 17 g by mouth daily. 06/18/20  Yes Nance Pear, MD  Prenatal Vit-Fe Fumarate-FA (PRENATAL MULTIVITAMIN) TABS tablet Take 1 tablet by mouth daily.   Yes [provider]  Probiotic Product (PROBIOTIC DAILY PO) Take 1 tablet by mouth daily.   Yes [provider]  simvastatin (ZOCOR) 40 MG tablet Take 20 mg by mouth at bedtime.    Yes [provider]  torsemide (DEMADEX) 20 MG tablet Take 20 mg by mouth daily.   Yes [provider]  traZODone (DESYREL) 50 MG tablet Take 50 mg by mouth at bedtime.   Yes [provider]  zolpidem (AMBIEN) 5 MG tablet Take 1 tablet (5 mg total) by mouth at bedtime as needed for sleep. 11/22/20  Yes Verlon Au, NP   Review of Systems  Constitutional:  Positive for fatigue. Negative for appetite change.  HENT:  Negative for congestion, postnasal drip and sore throat.   Eyes: Negative.   Respiratory:  Positive for cough and shortness of breath (on exertion).   Cardiovascular:  Positive for leg swelling (decreasing). Negative for chest pain and palpitations.  Gastrointestinal:  Positive for abdominal distention. Negative for abdominal pain.  Endocrine: Negative.   Genitourinary: Negative.   Musculoskeletal:  Positive for arthralgias and back pain.  Skin: Negative.   Allergic/Immunologic: Negative.   Neurological:  Positive for dizziness (at times) and weakness (leg). Negative for syncope, light-headedness and numbness.  Hematological:  Negative for adenopathy. Bruises/bleeds easily.  Psychiatric/Behavioral:  Positive for sleep disturbance (wearing oxygen at 2L when asleep). Negative for dysphoric mood. The patient is not nervous/anxious.    Vitals:   02/07/21 1054  BP: 120/75  Pulse: 89  Resp: 18  SpO2: 100%  Weight: 157 lb 4 oz (71.3 kg)  Height: 5' 3"  (1.6 m)   Wt Readings from Last 3 Encounters:  02/07/21 157 lb 4 oz  (71.3 kg)  02/06/21 157 lb 11.2 oz (71.5 kg)  01/30/21 160 lb 3.2 oz (72.7 kg)   Lab Results  Component Value Date   CREATININE 1.63 (H) 02/06/2021   CREATININE 1.73 (H) 01/30/2021   CREATININE 1.81 (H) 01/09/2021   Physical Exam Vitals and nursing note reviewed. Exam conducted with a chaperone present.  Constitutional:      Appearance: She is well-developed.  HENT:     Head: Normocephalic and atraumatic.  Neck:     Vascular: No JVD.  Cardiovascular:     Rate and Rhythm:  Normal rate and regular rhythm.  Pulmonary:     Effort: Pulmonary effort is normal. No respiratory distress.     Breath sounds: No wheezing or rales.  Abdominal:     General: There is no distension.     Palpations: Abdomen is soft.     Tenderness: There is no abdominal tenderness.  Musculoskeletal:     Cervical back: Normal range of motion and neck supple.     Right lower leg: No tenderness. Edema (trace pitting) present.     Left lower leg: No tenderness. Edema (trace pitting) present.  Skin:    General: Skin is warm and dry.  Neurological:     General: No focal deficit present.     Mental Status: She is alert and oriented to person, place, and time.  Psychiatric:        Mood and Affect: Mood normal.        Behavior: Behavior normal.   Assessment & Plan:  1: Chronic heart failure with reduced ejection fraction- - NYHA class III - she weighs daily and reports she was 154 this am at home, in the office she is 157 - not adding salt and she was encouraged to read food labels for sodium content carefully - saw cardiology Margarito Courser) 11/29/20 - has ICD present & it has fired in the past - BNP 03/28/20 was 318.3 - angioedema with lisinopril - spironolactone 12.5 mg changed to QD by nephrology - BMP checked on 02/06/21 by Oncology  2: HTN- - BP 120/75 - when SBP is <100, she takes midodrine - saw PCP (Feldpausch) 11/27/20 - BMP 02/06/21 reviewed and showed sodium 136, potassium 4.1, creatinine 1.63 and GFR  32  3: DM- - saw endocrinology Pasty Arch) 12/14/19 - A1c 06/18/20 was 6.0% - saw nephrology (Kolluru) 01/11/21  4: Multiple myeloma- - Hemoglobin 02/06/21 was 10.2 - saw GI Jacqulyn Liner) 09/15/19 - saw oncology Grayland Ormond) 02/06/21   Patient did not bring her medications nor a list. Each medication was verbally reviewed with the patient and she was encouraged to bring the bottles to every visit to confirm accuracy of list. Discussed to taking her spironolactone as prescribed by her nephrologist (12.5 mg PO QD)   Return in 3 month or sooner for any questions/problems.

## 2021-02-08 LAB — PROTEIN ELECTROPHORESIS, SERUM
A/G Ratio: 1.3 (ref 0.7–1.7)
Albumin ELP: 3.7 g/dL (ref 2.9–4.4)
Alpha-1-Globulin: 0.2 g/dL (ref 0.0–0.4)
Alpha-2-Globulin: 0.9 g/dL (ref 0.4–1.0)
Beta Globulin: 1.5 g/dL — ABNORMAL HIGH (ref 0.7–1.3)
Gamma Globulin: 0.3 g/dL — ABNORMAL LOW (ref 0.4–1.8)
Globulin, Total: 2.8 g/dL (ref 2.2–3.9)
M-Spike, %: 0.5 g/dL — ABNORMAL HIGH
Total Protein ELP: 6.5 g/dL (ref 6.0–8.5)

## 2021-02-09 ENCOUNTER — Other Ambulatory Visit: Payer: Self-pay

## 2021-02-09 ENCOUNTER — Inpatient Hospital Stay: Payer: Medicare Other

## 2021-02-09 VITALS — BP 97/56 | HR 90 | Temp 97.0°F | Resp 18 | Wt 154.2 lb

## 2021-02-09 DIAGNOSIS — C9000 Multiple myeloma not having achieved remission: Secondary | ICD-10-CM | POA: Diagnosis present

## 2021-02-09 DIAGNOSIS — K31811 Angiodysplasia of stomach and duodenum with bleeding: Secondary | ICD-10-CM | POA: Diagnosis not present

## 2021-02-09 DIAGNOSIS — E1142 Type 2 diabetes mellitus with diabetic polyneuropathy: Secondary | ICD-10-CM | POA: Diagnosis not present

## 2021-02-09 DIAGNOSIS — Z79899 Other long term (current) drug therapy: Secondary | ICD-10-CM | POA: Diagnosis not present

## 2021-02-09 DIAGNOSIS — Z5111 Encounter for antineoplastic chemotherapy: Secondary | ICD-10-CM | POA: Diagnosis not present

## 2021-02-09 DIAGNOSIS — I130 Hypertensive heart and chronic kidney disease with heart failure and stage 1 through stage 4 chronic kidney disease, or unspecified chronic kidney disease: Secondary | ICD-10-CM | POA: Diagnosis not present

## 2021-02-09 DIAGNOSIS — N189 Chronic kidney disease, unspecified: Secondary | ICD-10-CM | POA: Diagnosis not present

## 2021-02-09 DIAGNOSIS — D649 Anemia, unspecified: Secondary | ICD-10-CM | POA: Diagnosis not present

## 2021-02-09 MED ORDER — BORTEZOMIB CHEMO SQ INJECTION 3.5 MG (2.5MG/ML)
1.3000 mg/m2 | Freq: Once | INTRAMUSCULAR | Status: AC
  Administered 2021-02-09: 2.5 mg via SUBCUTANEOUS
  Filled 2021-02-09: qty 1

## 2021-02-09 MED ORDER — DEXAMETHASONE 4 MG PO TABS
20.0000 mg | ORAL_TABLET | Freq: Once | ORAL | Status: AC
  Administered 2021-02-09: 20 mg via ORAL
  Filled 2021-02-09: qty 5

## 2021-02-09 NOTE — Patient Instructions (Signed)
[facility]  Discharge Instructions: Thank you for choosing [facility] to provide your oncology and hematology care.  If you have a lab appointment with the [facility], please go directly to the [facility] and check in at the registration area.  Wear comfortable clothing and clothing appropriate for easy access to any Portacath or PICC line.   We strive to give you quality time with your provider. You may need to reschedule your appointment if you arrive late (15 or more minutes).  Arriving late affects you and other patients whose appointments are after yours.  Also, if you miss three or more appointments without notifying the office, you may be dismissed from the clinic at the provider's discretion.      For prescription refill requests, have your pharmacy contact our office and allow 72 hours for refills to be completed.    Today you received the following chemotherapy and/or immunotherapy agents Velcade      To help prevent nausea and vomiting after your treatment, we encourage you to take your nausea medication as directed.  BELOW ARE SYMPTOMS THAT SHOULD BE REPORTED IMMEDIATELY: *FEVER GREATER THAN 100.4 F (38 C) OR HIGHER *CHILLS OR SWEATING *NAUSEA AND VOMITING THAT IS NOT CONTROLLED WITH YOUR NAUSEA MEDICATION *UNUSUAL SHORTNESS OF BREATH *UNUSUAL BRUISING OR BLEEDING *URINARY PROBLEMS (pain or burning when urinating, or frequent urination) *BOWEL PROBLEMS (unusual diarrhea, constipation, pain near the anus) TENDERNESS IN MOUTH AND THROAT WITH OR WITHOUT PRESENCE OF ULCERS (sore throat, sores in mouth, or a toothache) UNUSUAL RASH, SWELLING OR PAIN  UNUSUAL VAGINAL DISCHARGE OR ITCHING   Items with * indicate a potential emergency and should be followed up as soon as possible or go to the Emergency Department if any problems should occur.  Please show the CHEMOTHERAPY ALERT CARD or IMMUNOTHERAPY ALERT CARD at check-in to  the Emergency Department and triage nurse.  Should you have questions after your visit or need to cancel or reschedule your appointment, please contact [facility]  [phone] and follow the prompts.  Office hours are 8:00 a.m. to 4:30 p.m. Monday - Friday. Please note that voicemails left after 4:00 p.m. may not be returned until the following business day.  We are closed weekends and major holidays. You have access to a nurse at all times for urgent questions. Please call the main number to the clinic [phone] and follow the prompts.  For any non-urgent questions, you may also contact your provider using MyChart. We now offer e-Visits for anyone 27 and older to request care online for non-urgent symptoms. For details visit [email or web address].   Also download the MyChart app! Go to the app store, search "MyChart", open the app, select [facility], and log in with your MyChart username and password.  Due to Covid, a mask is required upon entering the hospital/clinic. If you do not have a mask, one will be given to you upon arrival. For doctor visits, patients may have 1 support person aged 63 or older with them. For treatment visits, patients cannot have anyone with them due to current Covid guidelines and our immunocompromised population.

## 2021-02-09 NOTE — Progress Notes (Signed)
1340: pt here for Velcade. Pt states since being seen on 02/06/21 she has lost "5 pounds." She feels extremely weak and "dehydrated" although she reports drinking okay! Dr. Grayland Ormond and Beckey Rutter NP aware of pt reports and VS. Per Beckey Rutter NP and Dr. Grayland Ormond proceed with treatment, pt to push fluids at home and if symptoms worsen/do no improve follow up with Cardiac MD. Pt aware and agrees with plan.     1405: Pt stable at discharge.

## 2021-02-12 ENCOUNTER — Emergency Department: Payer: Medicare Other

## 2021-02-12 ENCOUNTER — Inpatient Hospital Stay
Admission: EM | Admit: 2021-02-12 | Discharge: 2021-02-20 | DRG: 378 | Disposition: A | Discharge status: Home Health | Payer: Medicare Other | Attending: Internal Medicine | Admitting: Internal Medicine

## 2021-02-12 ENCOUNTER — Other Ambulatory Visit: Payer: Self-pay

## 2021-02-12 DIAGNOSIS — I959 Hypotension, unspecified: Secondary | ICD-10-CM | POA: Diagnosis present

## 2021-02-12 DIAGNOSIS — W19XXXA Unspecified fall, initial encounter: Secondary | ICD-10-CM | POA: Diagnosis present

## 2021-02-12 DIAGNOSIS — D649 Anemia, unspecified: Secondary | ICD-10-CM | POA: Diagnosis present

## 2021-02-12 DIAGNOSIS — I5042 Chronic combined systolic (congestive) and diastolic (congestive) heart failure: Secondary | ICD-10-CM | POA: Diagnosis present

## 2021-02-12 DIAGNOSIS — Z880 Allergy status to penicillin: Secondary | ICD-10-CM

## 2021-02-12 DIAGNOSIS — Z79890 Hormone replacement therapy (postmenopausal): Secondary | ICD-10-CM

## 2021-02-12 DIAGNOSIS — B3781 Candidal esophagitis: Secondary | ICD-10-CM | POA: Diagnosis present

## 2021-02-12 DIAGNOSIS — K31811 Angiodysplasia of stomach and duodenum with bleeding: Principal | ICD-10-CM | POA: Diagnosis present

## 2021-02-12 DIAGNOSIS — Z9981 Dependence on supplemental oxygen: Secondary | ICD-10-CM

## 2021-02-12 DIAGNOSIS — J9611 Chronic respiratory failure with hypoxia: Secondary | ICD-10-CM | POA: Diagnosis present

## 2021-02-12 DIAGNOSIS — K922 Gastrointestinal hemorrhage, unspecified: Principal | ICD-10-CM

## 2021-02-12 DIAGNOSIS — E872 Acidosis, unspecified: Secondary | ICD-10-CM | POA: Diagnosis present

## 2021-02-12 DIAGNOSIS — F17290 Nicotine dependence, other tobacco product, uncomplicated: Secondary | ICD-10-CM | POA: Diagnosis present

## 2021-02-12 DIAGNOSIS — N184 Chronic kidney disease, stage 4 (severe): Secondary | ICD-10-CM | POA: Diagnosis present

## 2021-02-12 DIAGNOSIS — N189 Chronic kidney disease, unspecified: Secondary | ICD-10-CM | POA: Diagnosis present

## 2021-02-12 DIAGNOSIS — D500 Iron deficiency anemia secondary to blood loss (chronic): Secondary | ICD-10-CM | POA: Diagnosis present

## 2021-02-12 DIAGNOSIS — E1165 Type 2 diabetes mellitus with hyperglycemia: Secondary | ICD-10-CM | POA: Diagnosis present

## 2021-02-12 DIAGNOSIS — N179 Acute kidney failure, unspecified: Secondary | ICD-10-CM | POA: Diagnosis present

## 2021-02-12 DIAGNOSIS — K5730 Diverticulosis of large intestine without perforation or abscess without bleeding: Secondary | ICD-10-CM | POA: Diagnosis present

## 2021-02-12 DIAGNOSIS — I130 Hypertensive heart and chronic kidney disease with heart failure and stage 1 through stage 4 chronic kidney disease, or unspecified chronic kidney disease: Secondary | ICD-10-CM | POA: Diagnosis present

## 2021-02-12 DIAGNOSIS — K7460 Unspecified cirrhosis of liver: Secondary | ICD-10-CM | POA: Diagnosis present

## 2021-02-12 DIAGNOSIS — D849 Immunodeficiency, unspecified: Secondary | ICD-10-CM | POA: Diagnosis present

## 2021-02-12 DIAGNOSIS — Z8673 Personal history of transient ischemic attack (TIA), and cerebral infarction without residual deficits: Secondary | ICD-10-CM

## 2021-02-12 DIAGNOSIS — Z7982 Long term (current) use of aspirin: Secondary | ICD-10-CM

## 2021-02-12 DIAGNOSIS — Z96641 Presence of right artificial hip joint: Secondary | ICD-10-CM | POA: Diagnosis present

## 2021-02-12 DIAGNOSIS — J449 Chronic obstructive pulmonary disease, unspecified: Secondary | ICD-10-CM | POA: Diagnosis present

## 2021-02-12 DIAGNOSIS — D630 Anemia in neoplastic disease: Secondary | ICD-10-CM | POA: Diagnosis present

## 2021-02-12 DIAGNOSIS — Z95810 Presence of automatic (implantable) cardiac defibrillator: Secondary | ICD-10-CM | POA: Diagnosis present

## 2021-02-12 DIAGNOSIS — D696 Thrombocytopenia, unspecified: Secondary | ICD-10-CM | POA: Diagnosis present

## 2021-02-12 DIAGNOSIS — D631 Anemia in chronic kidney disease: Secondary | ICD-10-CM | POA: Diagnosis present

## 2021-02-12 DIAGNOSIS — K31819 Angiodysplasia of stomach and duodenum without bleeding: Secondary | ICD-10-CM | POA: Diagnosis not present

## 2021-02-12 DIAGNOSIS — E1122 Type 2 diabetes mellitus with diabetic chronic kidney disease: Secondary | ICD-10-CM | POA: Diagnosis present

## 2021-02-12 DIAGNOSIS — Z882 Allergy status to sulfonamides status: Secondary | ICD-10-CM

## 2021-02-12 DIAGNOSIS — F17210 Nicotine dependence, cigarettes, uncomplicated: Secondary | ICD-10-CM | POA: Diagnosis present

## 2021-02-12 DIAGNOSIS — C9000 Multiple myeloma not having achieved remission: Secondary | ICD-10-CM | POA: Diagnosis present

## 2021-02-12 DIAGNOSIS — Z20822 Contact with and (suspected) exposure to covid-19: Secondary | ICD-10-CM | POA: Diagnosis present

## 2021-02-12 DIAGNOSIS — Z79899 Other long term (current) drug therapy: Secondary | ICD-10-CM

## 2021-02-12 DIAGNOSIS — Z9049 Acquired absence of other specified parts of digestive tract: Secondary | ICD-10-CM

## 2021-02-12 DIAGNOSIS — Z888 Allergy status to other drugs, medicaments and biological substances status: Secondary | ICD-10-CM

## 2021-02-12 DIAGNOSIS — I5022 Chronic systolic (congestive) heart failure: Secondary | ICD-10-CM | POA: Diagnosis present

## 2021-02-12 DIAGNOSIS — Z8249 Family history of ischemic heart disease and other diseases of the circulatory system: Secondary | ICD-10-CM

## 2021-02-12 DIAGNOSIS — Z955 Presence of coronary angioplasty implant and graft: Secondary | ICD-10-CM

## 2021-02-12 DIAGNOSIS — D72829 Elevated white blood cell count, unspecified: Secondary | ICD-10-CM | POA: Diagnosis present

## 2021-02-12 DIAGNOSIS — I2510 Atherosclerotic heart disease of native coronary artery without angina pectoris: Secondary | ICD-10-CM | POA: Diagnosis present

## 2021-02-12 DIAGNOSIS — E875 Hyperkalemia: Secondary | ICD-10-CM | POA: Diagnosis present

## 2021-02-12 DIAGNOSIS — I252 Old myocardial infarction: Secondary | ICD-10-CM

## 2021-02-12 DIAGNOSIS — J9610 Chronic respiratory failure, unspecified whether with hypoxia or hypercapnia: Secondary | ICD-10-CM | POA: Diagnosis present

## 2021-02-12 DIAGNOSIS — Z86010 Personal history of colonic polyps: Secondary | ICD-10-CM

## 2021-02-12 DIAGNOSIS — I255 Ischemic cardiomyopathy: Secondary | ICD-10-CM | POA: Diagnosis present

## 2021-02-12 DIAGNOSIS — Z818 Family history of other mental and behavioral disorders: Secondary | ICD-10-CM

## 2021-02-12 LAB — URINALYSIS, COMPLETE (UACMP) WITH MICROSCOPIC
Bilirubin Urine: NEGATIVE
Glucose, UA: 50 mg/dL — AB
Hgb urine dipstick: NEGATIVE
Ketones, ur: NEGATIVE mg/dL
Nitrite: NEGATIVE
Protein, ur: NEGATIVE mg/dL
Specific Gravity, Urine: 1.014 (ref 1.005–1.030)
Squamous Epithelial / HPF: NONE SEEN (ref 0–5)
WBC, UA: NONE SEEN WBC/hpf (ref 0–5)
pH: 5 (ref 5.0–8.0)

## 2021-02-12 LAB — COMPREHENSIVE METABOLIC PANEL
ALT: 19 U/L (ref 0–44)
AST: 23 U/L (ref 15–41)
Albumin: 3.8 g/dL (ref 3.5–5.0)
Alkaline Phosphatase: 68 U/L (ref 38–126)
Anion gap: 11 (ref 5–15)
BUN: 135 mg/dL — ABNORMAL HIGH (ref 8–23)
CO2: 27 mmol/L (ref 22–32)
Calcium: 9.2 mg/dL (ref 8.9–10.3)
Chloride: 94 mmol/L — ABNORMAL LOW (ref 98–111)
Creatinine, Ser: 2 mg/dL — ABNORMAL HIGH (ref 0.44–1.00)
GFR, Estimated: 25 mL/min — ABNORMAL LOW (ref >60)
Glucose, Bld: 353 mg/dL — ABNORMAL HIGH (ref 70–99)
Potassium: 5.5 mmol/L — ABNORMAL HIGH (ref 3.5–5.1)
Sodium: 132 mmol/L — ABNORMAL LOW (ref 135–145)
Total Bilirubin: 0.8 mg/dL (ref 0.3–1.2)
Total Protein: 6.6 g/dL (ref 6.5–8.1)

## 2021-02-12 LAB — BASIC METABOLIC PANEL
Anion gap: 9 (ref 5–15)
BUN: 109 mg/dL — ABNORMAL HIGH (ref 8–23)
CO2: 21 mmol/L — ABNORMAL LOW (ref 22–32)
Calcium: 8.7 mg/dL — ABNORMAL LOW (ref 8.9–10.3)
Chloride: 106 mmol/L (ref 98–111)
Creatinine, Ser: 1.48 mg/dL — ABNORMAL HIGH (ref 0.44–1.00)
GFR, Estimated: 36 mL/min — ABNORMAL LOW (ref >60)
Glucose, Bld: 204 mg/dL — ABNORMAL HIGH (ref 70–99)
Potassium: 5.1 mmol/L (ref 3.5–5.1)
Sodium: 136 mmol/L (ref 135–145)

## 2021-02-12 LAB — CBC WITH DIFFERENTIAL/PLATELET
Abs Immature Granulocytes: 0.99 10*3/uL — ABNORMAL HIGH (ref 0.00–0.07)
Basophils Absolute: 0 10*3/uL (ref 0.0–0.1)
Basophils Relative: 0 %
Eosinophils Absolute: 0 10*3/uL (ref 0.0–0.5)
Eosinophils Relative: 0 %
HCT: 19.5 % — ABNORMAL LOW (ref 36.0–46.0)
Hemoglobin: 6 g/dL — ABNORMAL LOW (ref 12.0–15.0)
Immature Granulocytes: 4 %
Lymphocytes Relative: 7 %
Lymphs Abs: 1.6 10*3/uL (ref 0.7–4.0)
MCH: 25.9 pg — ABNORMAL LOW (ref 26.0–34.0)
MCHC: 30.8 g/dL (ref 30.0–36.0)
MCV: 84.1 fL (ref 80.0–100.0)
Monocytes Absolute: 2.3 10*3/uL — ABNORMAL HIGH (ref 0.1–1.0)
Monocytes Relative: 10 %
Neutro Abs: 18.9 10*3/uL — ABNORMAL HIGH (ref 1.7–7.7)
Neutrophils Relative %: 79 %
Platelets: 111 10*3/uL — ABNORMAL LOW (ref 150–400)
RBC: 2.32 MIL/uL — ABNORMAL LOW (ref 3.87–5.11)
RDW: 18.5 % — ABNORMAL HIGH (ref 11.5–15.5)
WBC: 23.7 10*3/uL — ABNORMAL HIGH (ref 4.0–10.5)
nRBC: 1.9 % — ABNORMAL HIGH (ref 0.0–0.2)

## 2021-02-12 LAB — IRON AND TIBC
Iron: 43 ug/dL (ref 28–170)
Saturation Ratios: 8 % — ABNORMAL LOW (ref 10.4–31.8)
TIBC: 570 ug/dL — ABNORMAL HIGH (ref 250–450)
UIBC: 527 ug/dL

## 2021-02-12 LAB — LACTIC ACID, PLASMA
Lactic Acid, Venous: 2.9 mmol/L (ref 0.5–1.9)
Lactic Acid, Venous: 2.6 mmol/L (ref 0.5–1.9)
Lactic Acid, Venous: 2.8 mmol/L (ref 0.5–1.9)
Lactic Acid, Venous: 2.1 mmol/L (ref 0.5–1.9)

## 2021-02-12 LAB — RESP PANEL BY RT-PCR (FLU A&B, COVID) ARPGX2
Influenza A by PCR: NEGATIVE
Influenza B by PCR: NEGATIVE
SARS Coronavirus 2 by RT PCR: NEGATIVE

## 2021-02-12 LAB — PREPARE RBC (CROSSMATCH)

## 2021-02-12 LAB — APTT: aPTT: 21 seconds — ABNORMAL LOW (ref 24–36)

## 2021-02-12 LAB — PROTIME-INR
INR: 1.2 (ref 0.8–1.2)
Prothrombin Time: 15.6 seconds — ABNORMAL HIGH (ref 11.4–15.2)

## 2021-02-12 MED ORDER — SODIUM CHLORIDE 0.9 % IV SOLN: 10.0000 mL/h | Freq: Once | INTRAVENOUS | Status: DC

## 2021-02-12 MED ORDER — MAGNESIUM OXIDE 400 MG PO TABS
200.0000 mg | ORAL_TABLET | Freq: Every day | ORAL | Status: DC
  Administered 2021-02-12 – 2021-02-20 (×8): 200 mg via ORAL
  Filled 2021-02-12 (×2): qty 0.5
  Filled 2021-02-12: qty 1
  Filled 2021-02-12 (×2): qty 0.5
  Filled 2021-02-12: qty 1
  Filled 2021-02-12 (×2): qty 0.5
  Filled 2021-02-12 (×3): qty 1
  Filled 2021-02-12 (×3): qty 0.5
  Filled 2021-02-12: qty 1

## 2021-02-12 MED ORDER — INSULIN ASPART 100 UNIT/ML IV SOLN
10.0000 [IU] | Freq: Once | INTRAVENOUS | Status: AC
  Administered 2021-02-12: 10 [IU] via INTRAVENOUS
  Filled 2021-02-12: qty 0.1

## 2021-02-12 MED ORDER — VANCOMYCIN HCL 500 MG/100ML IV SOLN
500.0000 mg | Freq: Once | INTRAVENOUS | Status: AC
  Administered 2021-02-12: 500 mg via INTRAVENOUS
  Filled 2021-02-12: qty 100

## 2021-02-12 MED ORDER — LORATADINE 10 MG PO TABS
10.0000 mg | ORAL_TABLET | Freq: Every day | ORAL | Status: DC
  Administered 2021-02-12 – 2021-02-20 (×8): 10 mg via ORAL
  Filled 2021-02-12 (×8): qty 1

## 2021-02-12 MED ORDER — ZOLPIDEM TARTRATE 5 MG PO TABS
5.0000 mg | ORAL_TABLET | Freq: Every evening | ORAL | Status: DC | PRN
  Administered 2021-02-15 – 2021-02-16 (×2): 5 mg via ORAL
  Filled 2021-02-12 (×2): qty 1

## 2021-02-12 MED ORDER — ASPIRIN EC 81 MG PO TBEC
81.0000 mg | DELAYED_RELEASE_TABLET | Freq: Every day | ORAL | Status: DC
  Administered 2021-02-12 – 2021-02-19 (×8): 81 mg via ORAL
  Filled 2021-02-12 (×8): qty 1

## 2021-02-12 MED ORDER — SODIUM CHLORIDE 0.9 % IV SOLN
2.0000 g | Freq: Once | INTRAVENOUS | Status: DC
  Filled 2021-02-12: qty 2

## 2021-02-12 MED ORDER — DEXAMETHASONE 6 MG PO TABS: 20.0000 mg | ORAL_TABLET | Freq: Every day | ORAL | Status: DC

## 2021-02-12 MED ORDER — SIMVASTATIN 20 MG PO TABS
20.0000 mg | ORAL_TABLET | Freq: Every day | ORAL | Status: DC
  Administered 2021-02-12 – 2021-02-19 (×8): 20 mg via ORAL
  Filled 2021-02-12 (×2): qty 1
  Filled 2021-02-12: qty 2
  Filled 2021-02-12 (×6): qty 1

## 2021-02-12 MED ORDER — GABAPENTIN 400 MG PO CAPS
700.0000 mg | ORAL_CAPSULE | Freq: Every day | ORAL | Status: DC
  Administered 2021-02-12 – 2021-02-19 (×8): 700 mg via ORAL
  Filled 2021-02-12 (×2): qty 1
  Filled 2021-02-12: qty 2
  Filled 2021-02-12 (×5): qty 1

## 2021-02-12 MED ORDER — ENSURE MAX PROTEIN PO LIQD
11.0000 [oz_av] | Freq: Two times a day (BID) | ORAL | Status: DC
  Administered 2021-02-13 – 2021-02-20 (×9): 11 [oz_av] via ORAL
  Filled 2021-02-12: qty 330

## 2021-02-12 MED ORDER — DEXTROSE 50 % IV SOLN
0.5000 | Freq: Once | INTRAVENOUS | Status: AC
  Administered 2021-02-12: 25 mL via INTRAVENOUS
  Filled 2021-02-12: qty 50

## 2021-02-12 MED ORDER — PANTOPRAZOLE SODIUM 40 MG IV SOLR
40.0000 mg | Freq: Once | INTRAVENOUS | Status: AC
  Administered 2021-02-12: 40 mg via INTRAVENOUS
  Filled 2021-02-12: qty 40

## 2021-02-12 MED ORDER — FLUOXETINE HCL 10 MG PO CAPS
10.0000 mg | ORAL_CAPSULE | Freq: Every day | ORAL | Status: DC
  Administered 2021-02-12 – 2021-02-20 (×7): 10 mg via ORAL
  Filled 2021-02-12 (×9): qty 1

## 2021-02-12 MED ORDER — METRONIDAZOLE 500 MG/100ML IV SOLN
500.0000 mg | Freq: Once | INTRAVENOUS | Status: AC
  Administered 2021-02-12: 500 mg via INTRAVENOUS
  Filled 2021-02-12 (×2): qty 100

## 2021-02-12 MED ORDER — SODIUM CHLORIDE 0.9 % IV BOLUS
1000.0000 mL | Freq: Once | INTRAVENOUS | Status: AC
  Administered 2021-02-12: 1000 mL via INTRAVENOUS

## 2021-02-12 MED ORDER — LEVOTHYROXINE SODIUM 50 MCG PO TABS
50.0000 ug | ORAL_TABLET | Freq: Every day | ORAL | Status: DC
  Administered 2021-02-15 – 2021-02-20 (×5): 50 ug via ORAL
  Filled 2021-02-12 (×6): qty 1

## 2021-02-12 MED ORDER — DOCUSATE SODIUM 100 MG PO CAPS
100.0000 mg | ORAL_CAPSULE | Freq: Two times a day (BID) | ORAL | Status: DC
  Administered 2021-02-12 – 2021-02-20 (×15): 100 mg via ORAL
  Filled 2021-02-12 (×15): qty 1

## 2021-02-12 MED ORDER — MIDODRINE HCL 5 MG PO TABS
10.0000 mg | ORAL_TABLET | Freq: Two times a day (BID) | ORAL | Status: DC
  Administered 2021-02-12 – 2021-02-16 (×7): 10 mg via ORAL
  Filled 2021-02-12 (×8): qty 2

## 2021-02-12 MED ORDER — POLYETHYLENE GLYCOL 3350 17 G PO PACK
17.0000 g | PACK | Freq: Every day | ORAL | Status: DC
  Administered 2021-02-14 – 2021-02-20 (×5): 17 g via ORAL
  Filled 2021-02-12 (×8): qty 1

## 2021-02-12 MED ORDER — TORSEMIDE 20 MG PO TABS: 40.0000 mg | ORAL_TABLET | Freq: Every day | ORAL | Status: DC

## 2021-02-12 MED ORDER — ALLOPURINOL 100 MG PO TABS
200.0000 mg | ORAL_TABLET | Freq: Every day | ORAL | Status: DC
  Administered 2021-02-12 – 2021-02-20 (×7): 200 mg via ORAL
  Filled 2021-02-12 (×8): qty 2

## 2021-02-12 MED ORDER — TRAZODONE HCL 50 MG PO TABS
50.0000 mg | ORAL_TABLET | Freq: Every day | ORAL | Status: DC
  Administered 2021-02-12 – 2021-02-19 (×8): 50 mg via ORAL
  Filled 2021-02-12 (×8): qty 1

## 2021-02-12 MED ORDER — GABAPENTIN 100 MG PO CAPS: 100.0000 mg | ORAL_CAPSULE | Freq: Three times a day (TID) | ORAL | Status: DC

## 2021-02-12 MED ORDER — RISAQUAD PO CAPS
1.0000 | ORAL_CAPSULE | Freq: Every day | ORAL | Status: DC
  Administered 2021-02-12 – 2021-02-20 (×8): 1 via ORAL
  Filled 2021-02-12 (×8): qty 1

## 2021-02-12 MED ORDER — METRONIDAZOLE 500 MG/100ML IV SOLN
500.0000 mg | Freq: Once | INTRAVENOUS | Status: AC
  Administered 2021-02-12: 500 mg via INTRAVENOUS
  Filled 2021-02-12: qty 100

## 2021-02-12 MED ORDER — PANTOPRAZOLE SODIUM 40 MG IV SOLR
40.0000 mg | INTRAVENOUS | Status: DC
  Administered 2021-02-13: 40 mg via INTRAVENOUS
  Filled 2021-02-12: qty 40

## 2021-02-12 MED ORDER — VANCOMYCIN HCL IN DEXTROSE 1-5 GM/200ML-% IV SOLN
1000.0000 mg | Freq: Once | INTRAVENOUS | Status: AC
  Administered 2021-02-12: 1000 mg via INTRAVENOUS
  Filled 2021-02-12: qty 200

## 2021-02-12 MED ORDER — MEXILETINE HCL 200 MG PO CAPS
200.0000 mg | ORAL_CAPSULE | Freq: Two times a day (BID) | ORAL | Status: DC
  Administered 2021-02-12 – 2021-02-20 (×15): 200 mg via ORAL
  Filled 2021-02-12 (×20): qty 1

## 2021-02-12 MED ORDER — GABAPENTIN 100 MG PO CAPS
100.0000 mg | ORAL_CAPSULE | Freq: Every day | ORAL | Status: DC
  Administered 2021-02-12 – 2021-02-20 (×7): 100 mg via ORAL
  Filled 2021-02-12 (×8): qty 1

## 2021-02-12 MED ORDER — SODIUM CHLORIDE 0.9 % IV SOLN
2.0000 g | Freq: Once | INTRAVENOUS | Status: AC
  Administered 2021-02-12: 2 g via INTRAVENOUS
  Filled 2021-02-12: qty 2

## 2021-02-12 MED ORDER — VANCOMYCIN VARIABLE DOSE PER UNSTABLE RENAL FUNCTION (PHARMACIST DOSING): Status: DC

## 2021-02-12 NOTE — ED Notes (Signed)
Port CXR performed

## 2021-02-12 NOTE — ED Notes (Signed)
IV attempt x 2 without success by E. Minta Balsam, RN and [employer], Therapist, sports. Once in the left [facility] and once in the right [facility]. MD notified and aware. Order for IV consult placed.

## 2021-02-12 NOTE — Consult Note (Signed)
Heather Peterson , MD [address] [place], [place], [place], [id number], [place], [place], [place], [address] Phone: [phone]  Fax: [phone]  Consultation  Referring Provider:   Dr Francine Graven Primary Care Physician:  [name], MD Primary Gastroenterologist: [facility]       Reason for Consultation:     Anemia  Date of Admission:  02/12/2021 Date of Consultation:  02/12/2021         HPI:   Heather Peterson is a 80 y.o. female who follows with [facility] gastroenterology as an outpatient last seen at their office in April 2022.  Who has a history of nonalcoholic cirrhosis, CHF, ischemic cardiomyopathy, AICD, CKD, COPD on oxygen and multiple myeloma who follows with Dr. Grayland Ormond.  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  Colonoscopy in April 2021 showed 3 adenomatous polyps in the transverse and descending colon.  I have been asked to see the patient for anemia.  Came into the emergency room for generalized weakness.  Was borderline hypotensive when she came in.  On admission hemoglobin 6 g usually runs between 7 to 8 g.  MCV 84.1.  Platelet count 111.  Potassium 5.5 glucose 353 creatinine 2.0 elevated from baseline.  Lactic acid elevated at 2.9.  INR 1.2.  CT abdomen pelvis without contrast shows no acute abnormality mild cirrhotic changes of liver diverticulosis of the colon.  The patient appeared comfortable when I went to speak to her in the emergency room.  Denies any hematemesis, melena, abdominal pain, NSAID use, use of any blood thinners. Past Medical History:  Diagnosis Date   Anxiety    Chronic combined systolic and diastolic CHF (congestive heart failure) (HCC)    Chronic kidney disease    Coronary artery disease    Depression    Diabetes mellitus without complication (HCC)    Diabetes mellitus, type II ([place])    Hypertension    MI (myocardial infarction)  ([place])    x 5   Pacemaker    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HCC)    Prolonged Q-T interval on ECG    Thyroid disease     Past Surgical History:  Procedure Laterality Date   CENTRAL LINE INSERTION  03/11/2017   Procedure: CENTRAL LINE INSERTION;  Surgeon: Leonie Man, MD;  Location: [place] [facility];  Service: Cardiovascular;;   CHOLECYSTECTOMY     COLONOSCOPY WITH PROPOFOL N/A 09/01/2019   Procedure: COLONOSCOPY WITH PROPOFOL;  Surgeon: Toledo, Benay Pike, MD;  Location: [facility] ENDOSCOPY;  Service: Gastroenterology;  Laterality: N/A;   CORONARY STENT INTERVENTION W/IMPELLA N/A 03/11/2017   Procedure: Coronary Stent Intervention w/Impella;  Surgeon: Leonie Man, MD;  Location: [place] [facility];  Service: Cardiovascular;  Laterality: N/A;   ESOPHAGOGASTRODUODENOSCOPY (EGD) WITH PROPOFOL N/A 09/01/2019   Procedure: ESOPHAGOGASTRODUODENOSCOPY (EGD) WITH PROPOFOL;  Surgeon: Toledo, Benay Pike, MD;  Location: [facility] ENDOSCOPY;  Service: Gastroenterology;  Laterality: N/A;   ESOPHAGOGASTRODUODENOSCOPY (EGD) WITH PROPOFOL N/A 09/08/2019   Procedure: ESOPHAGOGASTRODUODENOSCOPY (EGD) WITH PROPOFOL;  Surgeon: Heather Bellows, MD;  [facility] END[facility];  Service: Gastroenterology;  Laterality: N/A;   EYE SURGERY     HIP ARTHROPLASTY Right 03/29/2020   Procedure: ARTHROPLASTY BIPOLAR HIP (HEMIARTHROPLASTY);  Surgeon: Corky Mull, MD;  Location: [facility] ORS;  Service: Orthopedics;  Laterality: Right;   INTRAVASCULAR PRESSURE WIRE/FFR STUDY N/A 03/11/2017   Procedure: INTRAVASCULAR PRESSURE  WIRE/FFR STUDY;  Surgeon: Leonie Man, MD;  Location: [place] [facility];  Service: Cardiovascular;  Laterality: N/A;   INTRAVASCULAR ULTRASOUND/IVUS N/A 03/11/2017   Procedure: Intravascular Ultrasound/IVUS;  Surgeon: Leonie Man, MD;  Location: [place] [facility];  Service: Cardiovascular;  Laterality: N/A;   LEFT HEART CATH AND CORONARY ANGIOGRAPHY N/A 03/05/2017   Procedure: LEFT HEART CATH AND  CORONARY ANGIOGRAPHY;  Surgeon: Isaias Cowman, MD;  Location: [place] [facility];  Service: Cardiovascular;  Laterality: N/A;   PACEMAKER IMPLANT     pacemaker/defibrillator Left     Prior to Admission medications   Medication Sig Start Date End Date Taking? Authorizing Provider  allopurinol (ZYLOPRIM) 100 MG tablet Take 50 mg by mouth daily.    [provider]  aspirin EC 81 MG tablet Take 81 mg by mouth at bedtime.     [provider]  dexamethasone (DECADRON) 4 MG tablet Take 5 tablets (20 mg total) by mouth daily. Take the day after Velcade on days 2,5,9,12. Take with breakfast 11/14/20   Lloyd Huger, MD  docusate sodium (COLACE) 100 MG capsule Take 100 mg by mouth 2 (two) times daily.    [provider]  Ensure Max Protein (ENSURE MAX PROTEIN) LIQD Take 330 mLs (11 oz total) by mouth 2 (two) times daily between meals. 04/03/20   [name], MD  fexofenadine (ALLEGRA) 180 MG tablet Take 180 mg by mouth daily.    [provider]  FLUoxetine (PROZAC) 10 MG capsule Take 10 mg by mouth daily. 06/29/19   [provider]  gabapentin (NEURONTIN) 100 MG capsule TAKE 1 CAPSULE(100 MG) BY MOUTH THREE TIMES DAILY Patient taking differently: Patient reports 100 mg QAM, 700 mg QHS 12/29/20   Lloyd Huger, MD  gabapentin (NEURONTIN) 300 MG capsule Take 376m in AM, 3056min afternoon, 90019mt night Patient not taking: Reported on 02/07/2021 10/06/20   VasVentura SellersD  gabapentin (NEURONTIN) 600 MG tablet Take 600 mg by mouth daily. Patient takes 100 mg QAM, 700 mg QHS 11/07/20   [provider]  HYDROcodone-acetaminophen (NORCO/VICODIN) 5-325 MG tablet  01/29/21   [provider]  hydrocortisone (ANUSOL-HC) 2.5 % rectal cream Apply 1 application topically 2 (two) times daily. 02/12/20   [provider]  Lactulose 20 GM/30ML SOLN Take 30 mLs (20 g total) by mouth in the morning and at bedtime. 06/23/20   BurJacquelin HawkingP  levothyroxine (SYNTHROID) 50 MCG tablet Take by mouth. 08/14/20   [provider]  levothyroxine (SYNTHROID) 75 MCG tablet Take 1 tablet (75 mcg total) by mouth daily before breakfast. On Monday, Wednesday and Friday 04/03/20   AmiLorella NimrodD  magnesium oxide (MAG-OX) 400 MG tablet Take 200 mg by mouth daily.    [provider]  mexiletine (MEXITIL) 200 MG capsule Take 1 capsule (200 mg total) by mouth every 12 (twelve) hours. 03/14/17   SeiPatsey BertholdP  midodrine (PROAMATINE) 10 MG tablet Take 1 tablet (10 mg total) by mouth 2 (two) times daily with a meal. 04/03/20   AmiLorella NimrodD  OXYGEN Inhale 2 L into the lungs continuous.    [provider]  pantoprazole (PROTONIX) 40 MG tablet Take 40 mg by mouth daily.  05/16/19   [provider]  polyethylene glycol (MIRALAX) 17 g packet Take 17 g by mouth daily. 06/18/20   GooNance PearD  potassium chloride SA (KLOR-CON) 20 MEQ tablet Take 20 mEq by  mouth daily.    [provider]  Prenatal Vit-Fe Fumarate-FA (PRENATAL MULTIVITAMIN) TABS tablet Take 1 tablet by mouth daily.    [provider]  Probiotic Product (PROBIOTIC DAILY PO) Take 1 tablet by mouth daily.    [provider]  SANTYL ointment Apply topically. 01/01/21   [provider]  simvastatin (ZOCOR) 40 MG tablet Take 20 mg by mouth at bedtime.     [provider]  spironolactone (ALDACTONE) 25 MG tablet Take 0.5 tablets (12.5 mg total) by mouth every other day. Patient taking differently: Take 12.5 mg by mouth daily. 01/03/21 04/03/21  Alisa Graff, FNP  torsemide (DEMADEX) 20 MG tablet Take 40 mg by mouth daily.    [provider]  traZODone (DESYREL) 50 MG tablet Take 50 mg by mouth at bedtime.    [provider]  zolpidem (AMBIEN) 5 MG tablet Take 1 tablet (5 mg total) by mouth at bedtime as needed for sleep. 11/22/20   Verlon Au, NP    Family History   Problem Relation Age of Onset   Hypertension Father    Heart attack Father    Depression Sister    Depression Brother    Depression Brother      Social History   Tobacco Use   Smoking status: Every Day    Packs/day: 0.25    Types: E-cigarettes, Cigarettes   Smokeless tobacco: Never  Vaping Use   Vaping Use: Former  Substance Use Topics   Alcohol use: Not Currently    Comment: occasionally   Drug use: Yes    Comment: prescribed pain meds    Allergies as of 02/12/2021 - Review Complete 02/12/2021  Allergen Reaction Noted   Celebrex [celecoxib] Anaphylaxis 02/04/2017   Glipizide Anaphylaxis 08/03/2013   Levaquin [levofloxacin in d5w] Other (See Comments) 02/04/2017   Levofloxacin Other (See Comments) and Palpitations 02/10/2017   Lisinopril Swelling 05/19/2019   Sulfa antibiotics Other (See Comments) and Anaphylaxis 08/03/2013   Metformin Other (See Comments) 08/03/2013   Penicillins Rash and Other (See Comments) 08/03/2013    Review of Systems:    All systems reviewed and negative except where noted in HPI.   Physical Exam:  Vital signs in last 24 hours: Temp:  [97.9 F (36.6 C)] 97.9 F (36.6 C) (10/03 0606) Pulse Rate:  [86-97] 86 (10/03 1030) Resp:  [10-18] 18 (10/03 1030) BP: (90-118)/(50-65) 103/63 (10/03 1030) SpO2:  [98 %-100 %] 100 % (10/03 1030)   General:   Pleasant, cooperative in NAD Head:  Normocephalic and atraumatic. Eyes:   No icterus.   Conjunctiva pink. PERRLA. Ears:  Normal auditory acuity. Neck:  Supple; no masses or thyroidomegaly Lungs: Respirations even and unlabored. Lungs clear to auscultation bilaterally.   No wheezes, crackles, or rhonchi.  Heart:  Regular rate and rhythm;  Without murmur, clicks, rubs or gallops Abdomen:  Soft, nondistended, nontender. Normal bowel sounds. No appreciable masses or hepatomegaly.  No rebound or guarding.  Neurologic:  Alert and oriented x2;  grossly normal neurologically. Skin:  Intact without  significant lesions or rashes. Cervical Nodes:  No significant cervical adenopathy. Psych:  Alert and cooperative. Normal affect.  LAB RESULTS: Recent Labs    02/12/21 0648  WBC 23.7*  HGB 6.0*  HCT 19.5*  PLT 111*   BMET Recent Labs    02/12/21 0617  NA 132*  K 5.5*  CL 94*  CO2 27  GLUCOSE 353*  BUN 135*  CREATININE 2.00*  CALCIUM 9.2  LFT Recent Labs    02/12/21 0617  PROT 6.6  ALBUMIN 3.8  AST 23  ALT 19  ALKPHOS 68  BILITOT 0.8   PT/INR Recent Labs    02/12/21 0803  LABPROT 15.6*  INR 1.2    STUDIES: CT Abdomen Pelvis Wo Contrast  Result Date: 02/12/2021 CLINICAL DATA:  Generalized weakness and pain. History of multiple myeloma. Recent fall. EXAM: CT ABDOMEN AND PELVIS WITHOUT CONTRAST TECHNIQUE: Multidetector CT imaging of the abdomen and pelvis was performed following the standard protocol without IV contrast. COMPARISON:  CT abdomen and pelvis 06/18/2020 FINDINGS: Lower chest: Stable cardiomegaly. Hypodense blood pool, suggestive of edema anemia. Partially visualized pacemaker lead in the right ventricle. Coronary artery stent. No abnormal pericardial fluid. Scattered atherosclerosis of the descending thoracic aorta. Visualized lung bases are clear. Hepatobiliary: Mild surface nodularity. No focal liver abnormality is seen. Status post cholecystectomy. No biliary dilatation. Pancreas: Diffuse mild parenchymal atrophy and fatty infiltration. The main pancreatic duct is not dilated. No peripancreatic fluid collections. Spleen: Normal in size. A few calcifications are noted in the lateral aspect of the spleen, similar to prior studies. Adrenals/Urinary Tract: Stable low-attenuation (Hounsfield units -17 and -19) bilateral adrenal masses consistent with adrenal adenomas, unchanged compared to 03/02/2019. Stable cortical scarring at the posterior upper poles of the kidneys. No hydronephrosis. Bilateral nonobstructive nephrolithiasis also unchanged compared to  03/02/2019. Stomach/Bowel: Stomach is within normal limits. The appendix is not directly visualized, however no pericecal inflammatory changes. Diffuse diverticulosis involving the descending and sigmoid colon. No evidence of bowel wall thickening, distention, or inflammatory changes. Vascular/Lymphatic: Severe calcific atherosclerosis involving the abdominal aorta and common iliac arteries. Severe focal narrowing in the infrarenal abdominal aorta as well as the proximal left common iliac artery appear similar to 03/01/2021 the evaluation is limited due to lack of intravenous contrast. Scattered calcific atherosclerosis of the external iliac arteries. No lymphadenopathy in the abdomen or pelvis. Reproductive: Uterus and bilateral adnexa are unremarkable. Other: No abdominal wall hernia or abnormality. No abdominopelvic ascites. Musculoskeletal: Right hip prosthesis is intact. Multilevel degenerative disc disease in the visualized distal thoracic and lumbar spine, most severe at L4-L5 with vacuum disc phenomenon. No new acute or significant osseous finding. IMPRESSION: 1. No acute abnormality in the abdomen or pelvis. 2. Mild cirrhotic changes of the liver, likely secondary to CHF. 3. Diverticulosis of the descending and sigmoid colon without findings of diverticulitis. 4. Stable bilateral adrenal adenomas. 5. Aortic Atherosclerosis (ICD10-I70.0), severe. Electronically Signed   By: Ileana Roup M.D.   On: 02/12/2021 09:50   CT Head Wo Contrast  Result Date: 02/12/2021 CLINICAL DATA:  Generalized weakness and pain. Patient receives injections for multiple myeloma. There are multiple skin tears and bruises to both arms. Patient fell Saturday morning due to weakness. EXAM: CT HEAD WITHOUT CONTRAST CT CERVICAL SPINE WITHOUT CONTRAST TECHNIQUE: Multidetector CT imaging of the head and cervical spine was performed following the standard protocol without intravenous contrast. Multiplanar CT image reconstructions of the  cervical spine were also generated. COMPARISON:  Prior exams, most recent head CT, 12/15/2020. FINDINGS: CT HEAD FINDINGS Brain: No evidence of acute infarction, hemorrhage, hydrocephalus, extra-axial collection or mass lesion/mass effect. Encephalomalacia of the right frontal lobe consistent with an old infarct. Patchy bilateral white matter hypoattenuation noted consistent with moderate chronic microvascular ischemic change. Ventricular and sulcal enlargement also noted consistent with mild diffuse atrophy. These findings are stable. Vascular: No hyperdense vessel or unexpected calcification. Skull: Normal. Negative for fracture or focal lesion. Sinuses/Orbits: Visualized  globes and orbits are unremarkable. Visualized sinuses are clear. Other: None. CT CERVICAL SPINE FINDINGS Alignment: Slight reversal of the normal cervical lordosis. No spondylolisthesis. Skull base and vertebrae: No acute fracture. No primary bone lesion or focal pathologic process. Soft tissues and spinal canal: No prevertebral fluid or swelling. No visible canal hematoma. Disc levels: Mild loss of disc height at C3-C4. Remaining disc spaces are relatively well preserved. Facet degenerative change noted, most evident on the right at C2-C3 and C3-C4. No significant disc bulging or convincing disc herniation. Upper chest: No acute or significant abnormality. Other: None. IMPRESSION: HEAD CT 1. No acute intracranial abnormalities. 2. Old right frontal lobe infarct. Mild atrophy and moderate chronic microvascular ischemic change. CERVICAL CT 1. No fracture or acute finding. Electronically Signed   By: Lajean Manes M.D.   On: 02/12/2021 09:32   CT Cervical Spine Wo Contrast  Result Date: 02/12/2021 CLINICAL DATA:  Generalized weakness and pain. Patient receives injections for multiple myeloma. There are multiple skin tears and bruises to both arms. Patient fell Saturday morning due to weakness. EXAM: CT HEAD WITHOUT CONTRAST CT CERVICAL SPINE  WITHOUT CONTRAST TECHNIQUE: Multidetector CT imaging of the head and cervical spine was performed following the standard protocol without intravenous contrast. Multiplanar CT image reconstructions of the cervical spine were also generated. COMPARISON:  Prior exams, most recent head CT, 12/15/2020. FINDINGS: CT HEAD FINDINGS Brain: No evidence of acute infarction, hemorrhage, hydrocephalus, extra-axial collection or mass lesion/mass effect. Encephalomalacia of the right frontal lobe consistent with an old infarct. Patchy bilateral white matter hypoattenuation noted consistent with moderate chronic microvascular ischemic change. Ventricular and sulcal enlargement also noted consistent with mild diffuse atrophy. These findings are stable. Vascular: No hyperdense vessel or unexpected calcification. Skull: Normal. Negative for fracture or focal lesion. Sinuses/Orbits: Visualized globes and orbits are unremarkable. Visualized sinuses are clear. Other: None. CT CERVICAL SPINE FINDINGS Alignment: Slight reversal of the normal cervical lordosis. No spondylolisthesis. Skull base and vertebrae: No acute fracture. No primary bone lesion or focal pathologic process. Soft tissues and spinal canal: No prevertebral fluid or swelling. No visible canal hematoma. Disc levels: Mild loss of disc height at C3-C4. Remaining disc spaces are relatively well preserved. Facet degenerative change noted, most evident on the right at C2-C3 and C3-C4. No significant disc bulging or convincing disc herniation. Upper chest: No acute or significant abnormality. Other: None. IMPRESSION: HEAD CT 1. No acute intracranial abnormalities. 2. Old right frontal lobe infarct. Mild atrophy and moderate chronic microvascular ischemic change. CERVICAL CT 1. No fracture or acute finding. Electronically Signed   By: Lajean Manes M.D.   On: 02/12/2021 09:32   DG Chest Port 1 View  Result Date: 02/12/2021 CLINICAL DATA:  Questionable sepsis.  Weakness. EXAM:  PORTABLE CHEST 1 VIEW COMPARISON:  06/25/2020 FINDINGS: Dual-chamber ICD leads from the left. Cardiomegaly. Coronary stenting. There is no edema, consolidation, effusion, or pneumothorax. IMPRESSION: No evidence of active disease.  Chronic cardiomegaly. Electronically Signed   By: Jorje Guild M.D.   On: 02/12/2021 06:55      Impression / Plan:   ZEEVA COURSER is a 80 y.o. y/o female with history of COPD on oxygen, nonalcoholic cirrhosis of the liver, history of bleeding from AVMs from the upper GI tract treated in 2021 with APC, on oxygen, CHF, presents to the hospital with weakness.  Hemoglobin lower than baseline.  Acute kidney injury.  Elevation in BUN/creatinine ratio.  Very likely has an upper GI source of blood loss.  Likely AVMs which she has bled from in the past.  Potassium is elevated.   Plan 1.  Monitor CBC and transfuse as needed.  Presently has no acute bleeding. 2.  Treat AKI and elevated potassium.  When stable will consider endoscopy with aim to treat any AVMs with bleeding.  Likely in the next 24 to 48 hours 3.  Do not check any stool occult.  It is a test only meant for colon cancer screening has no role whatsoever in inpatient or outpatient evaluation of GI bleeding.  It also does not change the management.   Thank you for involving me in the care of this patient.      LOS: 0 days   Heather Bellows, MD  02/12/2021, 11:09 AM

## 2021-02-12 NOTE — Consult Note (Signed)
CODE SEPSIS - PHARMACY COMMUNICATION  **Broad Spectrum Antibiotics should be administered within 1 hour of Sepsis diagnosis**  Time Code Sepsis Called/Page Received: 6122  Antibiotics Ordered: Cefepime/Vancomycin/Metronidazole  Time of 1st antibiotic administration: 1000  Additional action taken by pharmacy: cefepime  If necessary, Name of Provider/Nurse Contacted: [place] ,PharmD Clinical Pharmacist  02/12/2021  9:05 AM

## 2021-02-12 NOTE — ED Notes (Signed)
Lab called to add on Iron and TIBC

## 2021-02-12 NOTE — ED Provider Notes (Signed)
-----------------------------------------   7:09 AM on 02/12/2021 -----------------------------------------  Blood pressure 102/63, pulse 94, temperature 97.9 F (36.6 C), temperature source Oral, resp. rate 10, SpO2 100 %.  Assuming care from Dr. Kerman Passey.  In short, Heather Peterson is a 80 y.o. female with a chief complaint of Weakness .  Refer to the original H&P for additional details.  The current plan of care is to follow-up labs and reassess for generalized weakness and nausea.  ----------------------------------------- 10:02 AM on 02/12/2021 ----------------------------------------- Labs are remarkable for anemia with hemoglobin of 6, which likely explains patient's generalized weakness.  She has not noticed any blood in her stool or dark tarry stools, however rectal exam reveals black stool that is guaiac positive.  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  We will transfuse 1 unit PRBCs and plan to discuss with hospitalist for admission.  Patient additionally noted to have marked leukocytosis with WBC count of 23.  CT abdomen/pelvis is unremarkable and shows no apparent source of infection, chest x-ray reviewed by me and shows no infiltrate, edema, or effusion.  UA is pending, but given lactic acidosis and leukocytosis, we will cover with broad-spectrum antibiotics for now.  .Critical Care Performed by: Blake Divine, MD Authorized by: Blake Divine, MD   Critical care provider statement:    Critical care time (minutes):  45   Critical care time was exclusive of:  Separately billable procedures and treating other patients and teaching time   Critical care was necessary to treat or prevent imminent or life-threatening deterioration of the following conditions:  Sepsis and circulatory failure   Critical care was time spent personally by me on the following activities:  Discussions with consultants,  evaluation of patient's response to treatment, examination of patient, ordering and performing treatments and interventions, ordering and review of laboratory studies, ordering and review of radiographic studies, pulse oximetry, re-evaluation of patient's condition, obtaining history from patient or surrogate and review of old charts   I assumed direction of critical care for this patient from another provider in my specialty: no     Care discussed with: admitting provider       Blake Divine, MD 02/12/21 1004

## 2021-02-12 NOTE — Consult Note (Signed)
PHARMACY -  BRIEF ANTIBIOTIC NOTE   Pharmacy has received consult(s) for Vancomycin/Cefepime from an ED provider.  The patient's profile has been reviewed for ht/wt/allergies/indication/available labs.    One time order(s) placed for Vancomycin 573m x 1 to follow 10073mdose ordered by ED physician for a total loading dose 1500 mg.    Continue Cefepime 2g x 1 as ordered.  Further antibiotics/pharmacy consults should be ordered by admitting physician if indicated.                       Thank you,  ChLu DuffelPharmD, BCPS Clinical Pharmacist 02/12/2021 9:10 AM

## 2021-02-12 NOTE — Consult Note (Signed)
Pharmacy Antibiotic Note  Heather Peterson is a 80 y.o. female admitted on 02/12/2021 with sepsis.  Pharmacy has been consulted for Vancomycin dosing. 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   SCR 2.00(baseline around 1.55)  Plan: Vancomycin 1,520m total loading dose x1. Will defer AUC dosing currently as patient is in AKI.  Will order Vancomycin random level for 1200 on 10/04.(approximately 24 hours after receiving loading dose)   If Scr/renal function improves, will initiate AUC dosing.     Temp (24hrs), Avg:97.7 F (36.5 C), Min:97.6 F (36.4 C), Max:97.9 F (36.6 C)  Recent Labs  Lab 02/06/21 0901 02/12/21 0617 02/12/21 0648 02/12/21 0950 02/12/21 1606  WBC 10.2  --  23.7*  --   --   CREATININE 1.63* 2.00*  --   --   --   LATICACIDVEN  --  2.9*  --  2.6* 2.8*    Estimated Creatinine Clearance: 21.4 mL/min (A) (by C-G formula based on SCr of 2 mg/dL (H)).    Allergies  Allergen Reactions   Celebrex [Celecoxib] Anaphylaxis   Glipizide Anaphylaxis   Levaquin [Levofloxacin In D5w] Other (See Comments)    Heart arrhthymias   Levofloxacin Other (See Comments) and Palpitations    ICD fired   Lisinopril Swelling    Lip and facial swelling   Sulfa Antibiotics Other (See Comments) and Anaphylaxis    Reaction: unknown   Metformin Other (See Comments)    Gi tolerance    Penicillins Rash and Other (See Comments)    Has patient had a PCN reaction causing immediate rash, facial/tongue/throat swelling, SOB or lightheadedness with hypotension: Unknown Has patient had a PCN reaction causing severe rash involving mucus membranes or skin necrosis: No Has patient had a PCN reaction that required hospitalization: No Has patient had a PCN reaction occurring within the last 10 years: No If all of the above answers are "NO", then may proceed with Cephalosporin use.     Antimicrobials this admission: Vancomycin 10/3 >>   Cefepime/Flagyl 10/3 x 1  Microbiology results: 10/3 BCx: pending 10/3 UCx: pending    Thank you for allowing pharmacy to be a part of this patient's care.  WPearla Dubonnet10/07/2020 4:47 PM

## 2021-02-12 NOTE — ED Notes (Signed)
Patient at CT scan.

## 2021-02-12 NOTE — ED Notes (Signed)
ED Provider at bedside. 

## 2021-02-12 NOTE — ED Notes (Signed)
Lab called to draw Lactic. Lab states it would take some time to get someone to draw lab

## 2021-02-12 NOTE — ED Notes (Signed)
Pt given phone to speak to husband

## 2021-02-12 NOTE — H&P (Addendum)
History and Physical    Heather Peterson YJE:[id number] DOB: Jul 18, 1940 DOA: 02/12/2021  PCP: [name], MD   Patient coming from: Home  I have personally briefly reviewed patient's old medical records in [place]  Chief Complaint: Weakness  HPI: Heather Peterson is a 80 y.o. 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  Patient states that she fell on the morning of her admission but denies hitting her head or any loss of consciousness. She complains of generalized body aches. She denies feeling dizzy or lightheaded.  She denies having any chest pain, no cough, no shortness of breath over her baseline, no changes in her bowel habits, no melena stools, no hematochezia, no fever, no chills, no lower extremity swelling, no palpitations, no diaphoresis, no nausea, no vomiting, no focal deficits or blurred vision. She denies any NSAID use. Labs show lactic acid 2.9 >>2.6, white count 23.7, hemoglobin 6.0, hematocrit 19.5, MCV 84.1, RDW 18.5, platelet count 111, sodium 132, potassium 5.5, chloride 94, bicarb 27, glucose 253, BUN 135, creatinine 2.0, calcium 9.2, total protein 6.6, albumin 3.8,, alkaline phosphatase 68, total bilirubin 0.8 Respiratory viral panel Chest x-ray reviewed by me shows no evidence of acute cardiopulmonary disease.  Chronic cardiomegaly CT scan of the head without contrast/) cervical spine CT shows no acute intracranial abnormalities.  Old right frontal lobe infarct.  Mild atrophy and moderate chronic microvascular ischemic change. No cervical fracture or acute finding. CT scan of abdomen  and pelvis without contrast shows no acute abnormality in the abdomen or pelvis.Mild cirrhotic changes of the liver, likely secondary to CHF. Diverticulosis of the descending and sigmoid colon without findings of diverticulitis. Stable bilateral adrenal adenomas. Aortic Atherosclerosis  severe. Twelve-lead EKG reviewed by me shows sinus rhythm with an incomplete right bundle branch block.     ED Course: Patient is a 80 year old female who presents to the ER for evaluation of increasing weakness and fatigue and is found to have anemia with a hemoglobin of 6.0g/dl compared to baseline of 8.3 about a week ago.   Patient noted to have marked leukocytosis with elevated lactic acid levels and no clear source of sepsis at this time. She received a dose of vancomycin, cefepime and Flagyl in the ER. She was transfused 1 unit of packed RBC in the ER and will be admitted to the hospital for further evaluation.    Review of Systems: As per HPI otherwise all other systems reviewed and negative.    Past Medical History:  Diagnosis Date   Anxiety    Chronic combined systolic and diastolic CHF (congestive heart failure) (HCC)    Chronic kidney disease    Coronary artery disease    Depression    Diabetes mellitus without complication (HCC)    Diabetes mellitus, type II ([place])    Hypertension    MI (myocardial infarction) ([place])    x 5   Pacemaker    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HCC)    Prolonged Q-T interval on ECG    Thyroid disease     Past Surgical History:  Procedure Laterality Date   CENTRAL LINE INSERTION  03/11/2017   Procedure: CENTRAL LINE INSERTION;  Surgeon: Leonie Man, MD;  Location: [facility];  Service: Cardiovascular;;   CHOLECYSTECTOMY     COLONOSCOPY WITH PROPOFOL N/A 09/01/2019   Procedure: COLONOSCOPY WITH PROPOFOL;  Surgeon: Toledo, Benay Pike, MD;  Location: [facility];  Service: Gastroenterology;  Laterality: N/A;   CORONARY STENT INTERVENTION W/IMPELLA  N/A 03/11/2017   Procedure: Coronary Stent Intervention w/Impella;  Surgeon: Leonie Man, MD;  Location: [facility];  Service: Cardiovascular;  Laterality: N/A;   ESOPHAGOGASTRODUODENOSCOPY (EGD) WITH PROPOFOL N/A 09/01/2019   Procedure: ESOPHAGOGASTRODUODENOSCOPY (EGD) WITH PROPOFOL;  Surgeon: Toledo, Benay Pike, MD;  Location: [facility];  Service: Gastroenterology;  Laterality: N/A;   ESOPHAGOGASTRODUODENOSCOPY (EGD) WITH PROPOFOL N/A 09/08/2019   Procedure: ESOPHAGOGASTRODUODENOSCOPY (EGD) WITH PROPOFOL;  Surgeon: [name], MD;  Location: [facility];  Service: Gastroenterology;  Laterality: N/A;   EYE SURGERY     HIP ARTHROPLASTY Right 03/29/2020   Procedure: ARTHROPLASTY BIPOLAR HIP (HEMIARTHROPLASTY);  Surgeon: Corky Mull, MD;  Location: [facility];  Service: Orthopedics;  Laterality: Right;   INTRAVASCULAR PRESSURE WIRE/FFR STUDY N/A 03/11/2017   Procedure: INTRAVASCULAR PRESSURE WIRE/FFR STUDY;  Surgeon: Leonie Man, MD;  Location: [place] [facility];  Service: Cardiovascular;  Laterality: N/A;   INTRAVASCULAR ULTRASOUND/IVUS N/A 03/11/2017   Procedure: Intravascular Ultrasound/IVUS;  Surgeon: Leonie Man, MD;  Location: [facility];  Service: Cardiovascular;  Laterality: N/A;   LEFT HEART CATH AND CORONARY ANGIOGRAPHY N/A 03/05/2017   Procedure: LEFT HEART CATH AND CORONARY ANGIOGRAPHY;  Surgeon: Isaias Cowman, MD;  Location: [facility];  Service: Cardiovascular;  Laterality: N/A;   PACEMAKER IMPLANT     pacemaker/defibrillator Left      reports that she has been smoking e-cigarettes and cigarettes. She has been smoking an average of .25 packs per day. She has never used smokeless tobacco. She reports that she does not currently use alcohol. She reports current drug use.  Allergies  Allergen Reactions   Celebrex [Celecoxib] Anaphylaxis   Glipizide Anaphylaxis   Levaquin [Levofloxacin In D5w] Other (See Comments)    Heart  arrhthymias   Levofloxacin Other (See Comments) and Palpitations    ICD fired   Lisinopril Swelling    Lip and facial swelling   Sulfa Antibiotics Other (See Comments) and Anaphylaxis    Reaction: unknown   Metformin Other (See Comments)    Gi tolerance    Penicillins Rash and Other (See Comments)    Has patient had a PCN reaction causing immediate rash, facial/tongue/throat swelling, SOB or lightheadedness with hypotension: Unknown Has patient had a PCN reaction causing severe rash involving mucus membranes or skin necrosis: No Has patient had a PCN reaction that required hospitalization: No Has patient had a PCN reaction occurring within the last 10 years: No If all of the above answers are "NO", then may proceed with Cephalosporin use.     Family History  Problem Relation Age of Onset   Hypertension Father    Heart attack Father    Depression Sister    Depression Brother    Depression Brother       Prior to Admission medications   Medication Sig Start Date End Date Taking? Authorizing Provider  allopurinol (ZYLOPRIM) 100 MG tablet Take 50 mg by mouth daily.    [provider]  aspirin EC 81 MG tablet Take 81 mg by mouth at bedtime.     [provider]  dexamethasone (DECADRON) 4 MG tablet Take 5 tablets (20 mg total) by  mouth daily. Take the day after Velcade on days 2,5,9,12. Take with breakfast 11/14/20   Lloyd Huger, MD  docusate sodium (COLACE) 100 MG capsule Take 100 mg by mouth 2 (two) times daily.    [provider]  Ensure Max Protein (ENSURE MAX PROTEIN) LIQD Take 330 mLs (11 oz total) by mouth 2 (two) times daily between meals. 04/03/20   [name], MD  fexofenadine (ALLEGRA) 180 MG tablet Take 180 mg by mouth daily.    [provider]  FLUoxetine (PROZAC) 10 MG capsule Take 10 mg by mouth daily. 06/29/19   [provider]  gabapentin (NEURONTIN) 100 MG capsule TAKE 1 CAPSULE(100 MG) BY MOUTH THREE TIMES  DAILY Patient taking differently: Patient reports 100 mg QAM, 700 mg QHS 12/29/20   Lloyd Huger, MD  gabapentin (NEURONTIN) 300 MG capsule Take 333m in AM, 3080min afternoon, 9003mt night Patient not taking: Reported on 02/07/2021 10/06/20   VasVentura SellersD  gabapentin (NEURONTIN) 600 MG tablet Take 600 mg by mouth daily. Patient takes 100 mg QAM, 700 mg QHS 11/07/20   [provider]  HYDROcodone-acetaminophen (NORCO/VICODIN) 5-325 MG tablet  01/29/21   [provider]  hydrocortisone (ANUSOL-HC) 2.5 % rectal cream Apply 1 application topically 2 (two) times daily. 02/12/20   [provider]  Lactulose 20 GM/30ML SOLN Take 30 mLs (20 g total) by mouth in the morning and at bedtime. 06/23/20   BurJacquelin HawkingP  levothyroxine (SYNTHROID) 50 MCG tablet Take by mouth. 08/14/20   [provider]  levothyroxine (SYNTHROID) 75 MCG tablet Take 1 tablet (75 mcg total) by mouth daily before breakfast. On Monday, Wednesday and Friday 04/03/20   AmiLorella NimrodD  magnesium oxide (MAG-OX) 400 MG tablet Take 200 mg by mouth daily.    [provider]  mexiletine (MEXITIL) 200 MG capsule Take 1 capsule (200 mg total) by mouth every 12 (twelve) hours. 03/14/17   SeiPatsey BertholdP  midodrine (PROAMATINE) 10 MG tablet Take 1 tablet (10 mg total) by mouth 2 (two) times daily with a meal. 04/03/20   AmiLorella NimrodD  OXYGEN Inhale 2 L into the lungs continuous.    [provider]  pantoprazole (PROTONIX) 40 MG tablet Take 40 mg by mouth daily.  05/16/19   [provider]  polyethylene glycol (MIRALAX) 17 g packet Take 17 g by mouth daily. 06/18/20   GooNance PearD  potassium chloride SA (KLOR-CON) 20 MEQ tablet Take 20 mEq by mouth daily.    [provider]  Prenatal Vit-Fe Fumarate-FA (PRENATAL MULTIVITAMIN) TABS tablet Take 1 tablet by mouth daily.    [provider]  Probiotic Product (PROBIOTIC DAILY PO) Take 1  tablet by mouth daily.    [provider]  SANTYL ointment Apply topically. 01/01/21   [provider]  simvastatin (ZOCOR) 40 MG tablet Take 20 mg by mouth at bedtime.     [provider]  spironolactone (ALDACTONE) 25 MG tablet Take 0.5 tablets (12.5 mg total) by mouth every other day. Patient taking differently: Take 12.5 mg by mouth daily. 01/03/21 04/03/21  HacAlisa GraffNP  torsemide (DEMADEX) 20 MG tablet Take 40 mg by mouth daily.    [provider]  traZODone (DESYREL) 50 MG tablet Take 50 mg by mouth at bedtime.    [provider]  zolpidem (AMBIEN) 5 MG tablet Take 1 tablet (5 mg total) by mouth at bedtime as needed  for sleep. 11/22/20   Verlon Au, NP    Physical Exam: Vitals:   02/12/21 0830 02/12/21 1005 02/12/21 1030 02/12/21 1200  BP: 118/60 112/61 103/63 (!) 107/56  Pulse: 88 90 86 84  Resp: 13 18 18 12   Temp:      TempSrc:      SpO2: 100% 100% 100% 100%     Vitals:   02/12/21 0830 02/12/21 1005 02/12/21 1030 02/12/21 1200  BP: 118/60 112/61 103/63 (!) 107/56  Pulse: 88 90 86 84  Resp: 13 18 18 12   Temp:      TempSrc:      SpO2: 100% 100% 100% 100%      Constitutional: Sleepy but arouses to verbal stimuli. Not in any apparent distress HEENT:      Head: Normocephalic and atraumatic.         Eyes: PERLA, EOMI, Conjunctivae are pale. Sclera is non-icteric.       Mouth/Throat: Mucous membranes are moist.       Neck: Supple with no signs of meningismus. Cardiovascular: Regular rate and rhythm. No murmurs, gallops, or rubs. 2+ symmetrical distal pulses are present . No JVD. No LE edema Respiratory: Respiratory effort normal .Lungs sounds clear bilaterally. No wheezes, crackles, or rhonchi.  Gastrointestinal: Soft, periumbilical tenderness, and non distended with positive bowel sounds.  Genitourinary: No CVA tenderness. Musculoskeletal: Nontender with normal range of motion in all extremities. No cyanosis, or  erythema of extremities. Neurologic:  Face is symmetric. Moving all extremities. No gross focal neurologic deficits . Skin: Skin is warm, dry.  Ecchymosis over both forearms and lower extremities Psychiatric: Mood and affect are normal    Labs on Admission: I have personally reviewed following labs and imaging studies  CBC: Recent Labs  Lab 02/06/21 0901 02/12/21 0648  WBC 10.2 23.7*  NEUTROABS 7.1 18.9*  HGB 8.3* 6.0*  HCT 27.7* 19.5*  MCV 87.7 84.1  PLT 190 517*   Basic Metabolic Panel: Recent Labs  Lab 02/06/21 0901 02/12/21 0617  NA 136 132*  K 4.1 5.5*  CL 94* 94*  CO2 30 27  GLUCOSE 157* 353*  BUN 79* 135*  CREATININE 1.63* 2.00*  CALCIUM 8.8* 9.2   GFR: Estimated Creatinine Clearance: 21.4 mL/min (A) (by C-G formula based on SCr of 2 mg/dL (H)). Liver Function Tests: Recent Labs  Lab 02/06/21 0901 02/12/21 0617  AST 19 23  ALT 21 19  ALKPHOS 91 68  BILITOT 0.7 0.8  PROT 7.1 6.6  ALBUMIN 4.1 3.8   No results for input(s): LIPASE, AMYLASE in the last 168 hours. No results for input(s): AMMONIA in the last 168 hours. Coagulation Profile: Recent Labs  Lab 02/12/21 0803  INR 1.2   Cardiac Enzymes: No results for input(s): CKTOTAL, CKMB, CKMBINDEX, TROPONINI in the last 168 hours. BNP (last 3 results) No results for input(s): PROBNP in the last 8760 hours. HbA1C: No results for input(s): HGBA1C in the last 72 hours. CBG: No results for input(s): GLUCAP in the last 168 hours. Lipid Profile: No results for input(s): CHOL, HDL, LDLCALC, TRIG, CHOLHDL, LDLDIRECT in the last 72 hours. Thyroid Function Tests: No results for input(s): TSH, T4TOTAL, FREET4, T3FREE, THYROIDAB in the last 72 hours. Anemia Panel: Recent Labs    02/12/21 0617  TIBC 570*  IRON 43   Urine analysis:    Component Value Date/Time   COLORURINE YELLOW (A) 06/25/2020 1116   APPEARANCEUR CLEAR (A) 06/25/2020 1116   LABSPEC 1.014 06/25/2020 1116  PHURINE 9.0 (H) 06/25/2020  1116   GLUCOSEU NEGATIVE 06/25/2020 1116   [place] 06/25/2020 [place] 06/25/2020 [place] 06/25/2020 1116   PROTEINUR NEGATIVE 06/25/2020 1116   NITRITE NEGATIVE 06/25/2020 1116   LEUKOCYTESUR SMALL (A) 06/25/2020 1116    Radiological Exams on Admission: CT Abdomen Pelvis Wo Contrast  Result Date: 02/12/2021 CLINICAL DATA:  Generalized weakness and pain. History of multiple myeloma. Recent fall. EXAM: CT ABDOMEN AND PELVIS WITHOUT CONTRAST TECHNIQUE: Multidetector CT imaging of the abdomen and pelvis was performed following the standard protocol without IV contrast. COMPARISON:  CT abdomen and pelvis 06/18/2020 FINDINGS: Lower chest: Stable cardiomegaly. Hypodense blood pool, suggestive of edema anemia. Partially visualized pacemaker lead in the right ventricle. Coronary artery stent. No abnormal pericardial fluid. Scattered atherosclerosis of the descending thoracic aorta. Visualized lung bases are clear. Hepatobiliary: Mild surface nodularity. No focal liver abnormality is seen. Status post cholecystectomy. No biliary dilatation. Pancreas: Diffuse mild parenchymal atrophy and fatty infiltration. The main pancreatic duct is not dilated. No peripancreatic fluid collections. Spleen: Normal in size. A few calcifications are noted in the lateral aspect of the spleen, similar to prior studies. Adrenals/Urinary Tract: Stable low-attenuation (Hounsfield units -17 and -19) bilateral adrenal masses consistent with adrenal adenomas, unchanged compared to 03/02/2019. Stable cortical scarring at the posterior upper poles of the kidneys. No hydronephrosis. Bilateral nonobstructive nephrolithiasis also unchanged compared to 03/02/2019. Stomach/Bowel: Stomach is within normal limits. The appendix is not directly visualized, however no pericecal inflammatory changes. Diffuse diverticulosis involving the descending and sigmoid colon. No evidence of bowel wall thickening,  distention, or inflammatory changes. Vascular/Lymphatic: Severe calcific atherosclerosis involving the abdominal aorta and common iliac arteries. Severe focal narrowing in the infrarenal abdominal aorta as well as the proximal left common iliac artery appear similar to 03/01/2021 the evaluation is limited due to lack of intravenous contrast. Scattered calcific atherosclerosis of the external iliac arteries. No lymphadenopathy in the abdomen or pelvis. Reproductive: Uterus and bilateral adnexa are unremarkable. Other: No abdominal wall hernia or abnormality. No abdominopelvic ascites. Musculoskeletal: Right hip prosthesis is intact. Multilevel degenerative disc disease in the visualized distal thoracic and lumbar spine, most severe at L4-L5 with vacuum disc phenomenon. No new acute or significant osseous finding. IMPRESSION: 1. No acute abnormality in the abdomen or pelvis. 2. Mild cirrhotic changes of the liver, likely secondary to CHF. 3. Diverticulosis of the descending and sigmoid colon without findings of diverticulitis. 4. Stable bilateral adrenal adenomas. 5. Aortic Atherosclerosis (ICD10-I70.0), severe. Electronically Signed   By: Ileana Roup M.D.   On: 02/12/2021 09:50   CT Head Wo Contrast  Result Date: 02/12/2021 CLINICAL DATA:  Generalized weakness and pain. Patient receives injections for multiple myeloma. There are multiple skin tears and bruises to both arms. Patient fell Saturday morning due to weakness. EXAM: CT HEAD WITHOUT CONTRAST CT CERVICAL SPINE WITHOUT CONTRAST TECHNIQUE: Multidetector CT imaging of the head and cervical spine was performed following the standard protocol without intravenous contrast. Multiplanar CT image reconstructions of the cervical spine were also generated. COMPARISON:  Prior exams, most recent head CT, 12/15/2020. FINDINGS: CT HEAD FINDINGS Brain: No evidence of acute infarction, hemorrhage, hydrocephalus, extra-axial collection or mass lesion/mass effect.  Encephalomalacia of the right frontal lobe consistent with an old infarct. Patchy bilateral white matter hypoattenuation noted consistent with moderate chronic microvascular ischemic change. Ventricular and sulcal enlargement also noted consistent with mild diffuse atrophy. These findings are stable. Vascular: No hyperdense vessel or unexpected calcification.  Skull: Normal. Negative for fracture or focal lesion. Sinuses/Orbits: Visualized globes and orbits are unremarkable. Visualized sinuses are clear. Other: None. CT CERVICAL SPINE FINDINGS Alignment: Slight reversal of the normal cervical lordosis. No spondylolisthesis. Skull base and vertebrae: No acute fracture. No primary bone lesion or focal pathologic process. Soft tissues and spinal canal: No prevertebral fluid or swelling. No visible canal hematoma. Disc levels: Mild loss of disc height at C3-C4. Remaining disc spaces are relatively well preserved. Facet degenerative change noted, most evident on the right at C2-C3 and C3-C4. No significant disc bulging or convincing disc herniation. Upper chest: No acute or significant abnormality. Other: None. IMPRESSION: HEAD CT 1. No acute intracranial abnormalities. 2. Old right frontal lobe infarct. Mild atrophy and moderate chronic microvascular ischemic change. CERVICAL CT 1. No fracture or acute finding. Electronically Signed   By: Lajean Manes M.D.   On: 02/12/2021 09:32   CT Cervical Spine Wo Contrast  Result Date: 02/12/2021 CLINICAL DATA:  Generalized weakness and pain. Patient receives injections for multiple myeloma. There are multiple skin tears and bruises to both arms. Patient fell Saturday morning due to weakness. EXAM: CT HEAD WITHOUT CONTRAST CT CERVICAL SPINE WITHOUT CONTRAST TECHNIQUE: Multidetector CT imaging of the head and cervical spine was performed following the standard protocol without intravenous contrast. Multiplanar CT image reconstructions of the cervical spine were also generated.  COMPARISON:  Prior exams, most recent head CT, 12/15/2020. FINDINGS: CT HEAD FINDINGS Brain: No evidence of acute infarction, hemorrhage, hydrocephalus, extra-axial collection or mass lesion/mass effect. Encephalomalacia of the right frontal lobe consistent with an old infarct. Patchy bilateral white matter hypoattenuation noted consistent with moderate chronic microvascular ischemic change. Ventricular and sulcal enlargement also noted consistent with mild diffuse atrophy. These findings are stable. Vascular: No hyperdense vessel or unexpected calcification. Skull: Normal. Negative for fracture or focal lesion. Sinuses/Orbits: Visualized globes and orbits are unremarkable. Visualized sinuses are clear. Other: None. CT CERVICAL SPINE FINDINGS Alignment: Slight reversal of the normal cervical lordosis. No spondylolisthesis. Skull base and vertebrae: No acute fracture. No primary bone lesion or focal pathologic process. Soft tissues and spinal canal: No prevertebral fluid or swelling. No visible canal hematoma. Disc levels: Mild loss of disc height at C3-C4. Remaining disc spaces are relatively well preserved. Facet degenerative change noted, most evident on the right at C2-C3 and C3-C4. No significant disc bulging or convincing disc herniation. Upper chest: No acute or significant abnormality. Other: None. IMPRESSION: HEAD CT 1. No acute intracranial abnormalities. 2. Old right frontal lobe infarct. Mild atrophy and moderate chronic microvascular ischemic change. CERVICAL CT 1. No fracture or acute finding. Electronically Signed   By: Lajean Manes M.D.   On: 02/12/2021 09:32   DG Chest Port 1 View  Result Date: 02/12/2021 CLINICAL DATA:  Questionable sepsis.  Weakness. EXAM: PORTABLE CHEST 1 VIEW COMPARISON:  06/25/2020 FINDINGS: Dual-chamber ICD leads from the left. Cardiomegaly. Coronary stenting. There is no edema, consolidation, effusion, or pneumothorax. IMPRESSION: No evidence of active disease.  Chronic  cardiomegaly. Electronically Signed   By: Jorje Guild M.D.   On: 02/12/2021 06:55     Assessment/Plan Principal Problem:   Symptomatic anemia Active Problems:   CKD stage 4 due to type 2 diabetes mellitus (HCC)   Chronic systolic CHF (congestive heart failure) ([place])   ICD (implantable cardioverter-defibrillator) in place   Anemia in chronic kidney disease (CKD)   Multiple myeloma (HCC)   Hyperkalemia   Chronic respiratory failure (HCC)   Leukocytosis   Lactic  acid acidosis   Patient is a 80 year old female who presents to the emergency room for evaluation of generalized weakness and fatigue as well as a fall and is found to have a hemoglobin of 6.0g/dl compared to her baseline of 8.3g/dl   Symptomatic anemia Patient presents for evaluation of generalized weakness and fatigue most likely secondary to acute on chronic anemia  There is no obvious source of blood loss at this time but per ER physician patient has heme positive stools She has a history of 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 1.63 >> 2.0 Hold torsemide and spironolactone Glycemic control with sliding scale insulin Place patient on a clear liquid diet    Hyperkalemia Probably medication induced Without EKG changes We will treat with dextrose and insulin Hold spironolactone     History of chronic systolic heart failure Last known LVEF of 25% from February, 2022 Hold torsemide and spironolactone due to worsening renal function Patient not on ACE inhibitor due to renal insufficiency and not on a beta-blocker due to relative hypotension.     History of multiple myeloma On chemotherapy Follow-up with  oncology as an outpatient     Rule out sepsis Patient with leukocytosis and elevated lactic acid level on admission Concerns for possible sepsis. Patient is afebrile and is not tachycardic but then is immunosuppressed and may not mount an appropriate response to sepsis Patient did have one blood pressure reading of 90/50 upon arrival to the ER but responded to IV fluid resuscitation and blood pressure is currently over 585 systolic No obvious source at this time Patient received broad-spectrum antibiotic therapy in the ER with vancomycin, cefepime and Flagyl We will trend lactic acid levels Continue empiric antibiotic therapy with vancomycin adjusted to renal function, cefepime and Flagyl even though the leukocytosis may just be a leukemoid reaction from ?? Bleeding Will hold off on aggressive IV fluid resuscitation due to patient's known history of severe cardiomyopathy with chronic systolic heart failure        DVT prophylaxis: SCD  Code Status: full code  Family Communication: Greater than 50% of time was spent discussing patient's condition and plan of care with her at the bedside.  All questions and concerns have been addressed.  She verbalizes understanding and agrees with the plan. Disposition Plan: Back to previous home environment Consults called: Oncology, gastroenterology Status:At the time of admission, it appears that the appropriate admission status for this patient is inpatient. This is judged to be reasonable and necessary to provide the required intensity of service to ensure the patient's safety given the presenting symptoms, physical exam findings, and initial radiographic and laboratory data in the context of their comorbid conditions. Patient requires inpatient status due to high intensity of service, high risk for further deterioration and high frequency of surveillance required.     Collier Bullock MD [facility]     02/12/2021, 1:22 PM

## 2021-02-12 NOTE — ED Notes (Signed)
Critical Lactic--2.9, MD notified and aware

## 2021-02-12 NOTE — ED Provider Notes (Signed)
[facility] Emergency Department Provider Note  Time seen: 6:27 AM  I have reviewed the triage vital signs and the nursing notes.   HISTORY  Chief Complaint Weakness   HPI Heather Peterson is a 80 y.o. 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  Patient denies any focal weakness.  Denies any significant cough.  Denies any shortness of breath over baseline.  Patient does wear 2 L of nasal cannula oxygen at baseline.  States nausea but denies vomiting and diarrhea.  Denies chest pain or abdominal pain.  Largely negative review of systems otherwise.   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[place])    Prolonged Q-T interval on ECG    Thyroid disease     Patient Active Problem List   Diagnosis Date Noted   Chemotherapy-induced peripheral neuropathy ([place]) 10/06/2020   Hospital discharge follow-up 08/31/2020   Difficulty sleeping 07/25/2020   Difficulty walking 07/25/2020   H/O subarachnoid hemorrhage 07/25/2020   Moderate protein-calorie malnutrition ([place]) 07/04/2020   Hyperkalemia 06/19/2020   Chronic respiratory failure ([place]) 06/19/2020   Hypokalemia    Fall    Troponin I above reference range    Status post hip hemiarthroplasty    Closed nondisplaced fracture of greater trochanter of right femur ([place]) 03/30/2020   Closed displaced midcervical fracture of  right femur ([place]) 03/30/2020   Hip fracture ([place]) 03/28/2020   Multiple myeloma ([place]) 03/01/2020   Goals of care, counseling/discussion 03/01/2020   Osteomyelitis ([place]) 01/20/2020   Gout 10/27/2019   Acute blood loss anemia    Gastrointestinal hemorrhage    UGI bleed 09/04/2019   Drug-induced constipation 07/26/2019   Insomnia due to medical condition 07/26/2019   Benign hypertensive kidney disease with chronic kidney disease 07/20/2019   Proteinuria 07/20/2019   Hyposmolality and/or hyponatremia 07/20/2019   Dehydration    Anemia in chronic kidney disease (CKD) 07/04/2019   Stage 3b chronic kidney disease ([place]) 07/04/2019   Chronic respiratory failure with hypoxia ([place]) 07/03/2019   Hyponatremia 07/03/2019   Prolonged Q-T interval on ECG    Weakness    Anemia    MDD (major depressive disorder), recurrent episode, 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   Tobacco use disorder 12/14/2018   At risk for prolonged QT interval syndrome 12/14/2018   Hypothyroidism, acquired, 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   Pedal edema 02/19/2018   SOB (shortness of breath) on exertion 02/19/2018   Left main coronary artery disease 03/07/2017   Drug-induced torsades de pointes 03/06/2017   Ventricular tachycardia 03/05/2017   Defibrillator discharge 03/04/2017   CHF (congestive heart failure), NYHA class III, chronic, combined ([place]) 03/04/2017   COPD (chronic obstructive pulmonary disease) ([place]) 02/10/2017   Diabetes mellitus type 2, uncomplicated ([place]) 93/[id number]/4287   H/O ventricular fibrillation 02/10/2017   Hyperlipidemia 02/10/2017   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  Ischemic cardiomyopathy 02/10/2017   Myocardial infarction ([place]) 02/10/2017   Moderate mitral regurgitation 02/10/2017   Syncope 02/04/2017   CAD (coronary artery disease) 02/04/2017   HTN (hypertension) 02/04/2017   Type 2 diabetes mellitus with stage 3 chronic kidney disease ([place]) 02/04/2017    Past Surgical History:   Procedure Laterality Date   CENTRAL LINE INSERTION  03/11/2017   Procedure: CENTRAL LINE INSERTION;  Surgeon: Leonie Man, MD;  Location: [place] [facility];  Service: Cardiovascular;;   CHOLECYSTECTOMY     COLONOSCOPY WITH PROPOFOL N/A 09/01/2019   Procedure: COLONOSCOPY WITH PROPOFOL;  Surgeon: Toledo, Benay Pike, MD;  Location: [facility] ENDOSCOPY;  Service: Gastroenterology;  Laterality: N/A;   CORONARY STENT INTERVENTION W/IMPELLA N/A 03/11/2017   Procedure: Coronary Stent Intervention w/Impella;  Surgeon: Leonie Man, MD;  Location: [place] [facility];  Service: Cardiovascular;  Laterality: N/A;   ESOPHAGOGASTRODUODENOSCOPY (EGD) WITH PROPOFOL N/A 09/01/2019   Procedure: ESOPHAGOGASTRODUODENOSCOPY (EGD) WITH PROPOFOL;  Surgeon: Toledo, Benay Pike, MD;  Location: [facility] ENDOSCOPY;  Service: Gastroenterology;  Laterality: N/A;   ESOPHAGOGASTRODUODENOSCOPY (EGD) WITH PROPOFOL N/A 09/08/2019   Procedure: ESOPHAGOGASTRODUODENOSCOPY (EGD) WITH PROPOFOL;  Surgeon: [name], MD;  Location: [facility] ENDOSCOPY;  Service: Gastroenterology;  Laterality: N/A;   EYE SURGERY     HIP ARTHROPLASTY Right 03/29/2020   Procedure: ARTHROPLASTY BIPOLAR HIP (HEMIARTHROPLASTY);  Surgeon: Corky Mull, MD;  Location: [facility] ORS;  Service: Orthopedics;  Laterality: Right;   INTRAVASCULAR PRESSURE WIRE/FFR STUDY N/A 03/11/2017   Procedure: INTRAVASCULAR PRESSURE WIRE/FFR STUDY;  Surgeon: Leonie Man, MD;  Location: [place] [facility];  Service: Cardiovascular;  Laterality: N/A;   INTRAVASCULAR ULTRASOUND/IVUS N/A 03/11/2017   Procedure: Intravascular Ultrasound/IVUS;  Surgeon: Leonie Man, MD;  Location: [place] [facility];  Service: Cardiovascular;  Laterality: N/A;   LEFT HEART CATH AND CORONARY ANGIOGRAPHY N/A 03/05/2017   Procedure: LEFT HEART CATH AND CORONARY ANGIOGRAPHY;  Surgeon: Isaias Cowman, MD;  Location: [place] [facility];  Service: Cardiovascular;  Laterality: N/A;   PACEMAKER IMPLANT      pacemaker/defibrillator Left     Prior to Admission medications   Medication Sig Start Date End Date Taking? Authorizing Provider  allopurinol (ZYLOPRIM) 100 MG tablet Take 50 mg by mouth daily.    [provider]  aspirin EC 81 MG tablet Take 81 mg by mouth at bedtime.     [provider]  dexamethasone (DECADRON) 4 MG tablet Take 5 tablets (20 mg total) by mouth daily. Take the day after Velcade on days 2,5,9,12. Take with breakfast 11/14/20   Lloyd Huger, MD  docusate sodium (COLACE) 100 MG capsule Take 100 mg by mouth 2 (two) times daily.    [provider]  Ensure Max Protein (ENSURE MAX PROTEIN) LIQD Take 330 mLs (11 oz total) by mouth 2 (two) times daily between meals. 04/03/20   [name], MD  fexofenadine (ALLEGRA) 180 MG tablet Take 180 mg by mouth daily.    [provider]  FLUoxetine (PROZAC) 10 MG capsule Take 10 mg by mouth daily. 06/29/19   [provider]  gabapentin (NEURONTIN) 100 MG capsule TAKE 1 CAPSULE(100 MG) BY MOUTH THREE TIMES DAILY Patient taking differently: Patient reports 100 mg QAM, 700 mg QHS 12/29/20   Lloyd Huger, MD  gabapentin (NEURONTIN) 300 MG capsule Take 368m in AM, 3053min afternoon, 90074mt night Patient not taking: Reported on 02/07/2021 10/06/20   VasVentura SellersD  gabapentin (NEURONTIN) 600 MG tablet  Take 600 mg by mouth daily. Patient takes 100 mg QAM, 700 mg QHS 11/07/20   [provider]  HYDROcodone-acetaminophen (NORCO/VICODIN) 5-325 MG tablet  01/29/21   [provider]  hydrocortisone (ANUSOL-HC) 2.5 % rectal cream Apply 1 application topically 2 (two) times daily. 02/12/20   [provider]  Lactulose 20 GM/30ML SOLN Take 30 mLs (20 g total) by mouth in the morning and at bedtime. 06/23/20   Jacquelin Hawking, NP  levothyroxine (SYNTHROID) 50 MCG tablet Take by mouth. 08/14/20   [provider]  levothyroxine (SYNTHROID) 75 MCG tablet Take 1  tablet (75 mcg total) by mouth daily before breakfast. On Monday, Wednesday and Friday 04/03/20   [name], MD  magnesium oxide (MAG-OX) 400 MG tablet Take 200 mg by mouth daily.    [provider]  mexiletine (MEXITIL) 200 MG capsule Take 1 capsule (200 mg total) by mouth every 12 (twelve) hours. 03/14/17   Patsey Berthold, NP  midodrine (PROAMATINE) 10 MG tablet Take 1 tablet (10 mg total) by mouth 2 (two) times daily with a meal. 04/03/20   [name], MD  OXYGEN Inhale 2 L into the lungs continuous.    [provider]  pantoprazole (PROTONIX) 40 MG tablet Take 40 mg by mouth daily.  05/16/19   [provider]  polyethylene glycol (MIRALAX) 17 g packet Take 17 g by mouth daily. 06/18/20   Nance Pear, MD  potassium chloride SA (KLOR-CON) 20 MEQ tablet Take 20 mEq by mouth daily.    [provider]  Prenatal Vit-Fe Fumarate-FA (PRENATAL MULTIVITAMIN) TABS tablet Take 1 tablet by mouth daily.    [provider]  Probiotic Product (PROBIOTIC DAILY PO) Take 1 tablet by mouth daily.    [provider]  SANTYL ointment Apply topically. 01/01/21   [provider]  simvastatin (ZOCOR) 40 MG tablet Take 20 mg by mouth at bedtime.     [provider]  spironolactone (ALDACTONE) 25 MG tablet Take 0.5 tablets (12.5 mg total) by mouth every other day. Patient taking differently: Take 12.5 mg by mouth daily. 01/03/21 04/03/21  Alisa Graff, FNP  torsemide (DEMADEX) 20 MG tablet Take 40 mg by mouth daily.    [provider]  traZODone (DESYREL) 50 MG tablet Take 50 mg by mouth at bedtime.    [provider]  zolpidem (AMBIEN) 5 MG tablet Take 1 tablet (5 mg total) by mouth at bedtime as needed for sleep. 11/22/20   Verlon Au, NP    Allergies  Allergen Reactions   Celebrex [Celecoxib] Anaphylaxis   Glipizide Anaphylaxis   Levaquin [Levofloxacin In D5w] Other (See Comments)    Heart arrhthymias    Levofloxacin Other (See Comments) and Palpitations    ICD fired   Lisinopril Swelling    Lip and facial swelling   Sulfa Antibiotics Other (See Comments) and Anaphylaxis    Reaction: unknown   Metformin Other (See Comments)    Gi tolerance    Penicillins Rash and Other (See Comments)    Has patient had a PCN reaction causing immediate rash, facial/tongue/throat swelling, SOB or lightheadedness with hypotension: Unknown Has patient had a PCN reaction causing severe rash involving mucus membranes or skin necrosis: No Has patient had a PCN reaction that required hospitalization: No Has patient had a PCN reaction occurring within the last 10 years: No If all of the above answers are "NO", then may proceed with Cephalosporin use.  Family History  Problem Relation Age of Onset   Hypertension Father    Heart attack Father    Depression Sister    Depression Brother    Depression Brother     Social History Social History   Tobacco Use   Smoking status: Every Day    Packs/day: 0.25    Types: E-cigarettes, Cigarettes   Smokeless tobacco: Never  Vaping Use   Vaping Use: Former  Substance Use Topics   Alcohol use: Not Currently    Comment: occasionally   Drug use: Yes    Comment: prescribed pain meds    Review of Systems Constitutional: Negative for fever.  Positive for generalized fatigue/generalized weakness. Cardiovascular: Negative for chest pain. Respiratory: Negative for shortness of breath. Gastrointestinal: Negative for abdominal pain.  Positive for nausea. Genitourinary: Negative for urinary compaints Musculoskeletal: Negative for musculoskeletal complaints Neurological: Negative for headache All other ROS negative  ____________________________________________   PHYSICAL EXAM:  VITAL SIGNS: ED Triage Vitals  Enc Vitals Group     BP 02/12/21 0606 (!) 90/50     Pulse Rate 02/12/21 0606 97     Resp 02/12/21 0606 16     Temp 02/12/21 0606 97.9 F (36.6 C)      Temp Source 02/12/21 0606 Oral     SpO2 02/12/21 0604 98 %     Weight --      Height --      Head Circumference --      Peak Flow --      Pain Score 02/12/21 0608 10     Pain Loc --      Pain Edu? --      Excl. in [place]? --    Constitutional: Fatigued appearing, keeps eyes closed through most of the exam.  No acute distress. Eyes: Normal exam ENT      Head: Normocephalic and atraumatic.      Mouth/Throat: Dry appearing mucous membranes. Cardiovascular: Normal rate, regular rhythm around 100 bpm. Respiratory: Normal respiratory effort without tachypnea nor retractions. Breath sounds are clear  Gastrointestinal: Soft and nontender. No distention.   Musculoskeletal: Nontender with normal range of motion in all extremities.  Neurologic:  Normal speech and language. No gross focal neurologic deficits Skin:  Skin is warm, dry and intact.  Psychiatric: Mood and affect are normal.   ____________________________________________    EKG  EKG viewed and interpreted by myself shows a normal sinus rhythm at 98 bpm with a narrow QRS, normal axis, slight QTC prolongation otherwise normal intervals, nonspecific but no concerning ST changes.  ____________________________________________    RADIOLOGY  Chest x-ray negative  ____________________________________________   INITIAL IMPRESSION / ASSESSMENT AND PLAN / ED COURSE  Pertinent labs & imaging results that were available during my care of the patient were reviewed by me and considered in my medical decision making (see chart for details).   Patient presents to the emergency department generalized fatigue and weakness.  Patient found to be borderline hypotensive 90/50, dry mucous membranes.  We will IV hydrate.  We will check labs including lactic acid.  We will obtain a portable chest x-ray, COVID swab, urinalysis and continue to closely monitor.  Patient agreeable to plan of care.  Work-up pending.  Patient care signed out to  oncoming provider.  Hope Brandenburger Roye was evaluated in Emergency Department on 02/12/2021 for the symptoms described in the history of present illness. She was evaluated in the context of the global COVID-19 pandemic, which necessitated consideration that the patient  might be at risk for infection with the SARS-CoV-2 virus that causes COVID-19. Institutional protocols and algorithms that pertain to the evaluation of patients at risk for COVID-19 are in a state of rapid change based on information released by regulatory bodies including the CDC and federal and state organizations. These policies and algorithms were followed during the patient's care in the ED.  ____________________________________________   FINAL CLINICAL IMPRESSION(S) / ED DIAGNOSES  Generalized weakness Nausea   Harvest Dark, MD 02/12/21 [phone]

## 2021-02-12 NOTE — Consult Note (Signed)
[place]  Telephone:([phone]) [phone] Fax:(336) [phone]  ID: Heather Peterson Denzler OB: December 23, 1940  MR#: [id number]  MMH#:[id number]  [name], MD as PCP - General (Family Medicine) Lloyd Huger, MD as Consulting Physician (Oncology)  CHIEF COMPLAINT: Symptomatic anemia  HPI: Heather Peterson is a 80 year old female with past medical history of nonalcoholic cirrhosis, CHF, ischemic cardiomyopathy, CKD, COPD on oxygen who is followed Dr. Grayland Ormond for multiple myeloma.  She was initially on Revlimid, Velcade and dexamethasone but she was unable to tolerate Revlimid.  She is currently receiving Velcade on days 1, 4, 8 and 11.  She has stable anemia and receives blood as needed.  She has a history of GI bleeding back in April 2021.  Her last EGD/colonoscopy was performed by Dr. 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  She presented to the emergency room today for generalized weakness with hypotension.  Hemoglobin was 6.0 with MCV 84.1.  Platelet count was 111,000.  Potassium was 5.5, glucose 333 with an increased creatinine from her baseline.  CT abdomen/pelvis showed mild cirrhotic changes of liver and diverticulosis in the colon.   GI was consulted who recommended a repeat EGD once lab work was stable.  INTERVAL HISTORY-patient is currently still in the emergency room and receiving 1 unit PRBC.  She is on oxygen.  She reports feeling better.  REVIEW OF SYSTEMS:   Review of Systems  Constitutional:  Positive for malaise/fatigue. Negative for chills, fever and weight loss.  HENT:  Negative for congestion, ear pain and tinnitus.   Eyes: Negative.  Negative for blurred vision and double vision.  Respiratory:  Positive for shortness of breath. Negative for cough and sputum production.   Cardiovascular: Negative.  Negative for chest pain, palpitations and leg  swelling.  Gastrointestinal: Negative.  Negative for abdominal pain, constipation, diarrhea, nausea and vomiting.  Genitourinary:  Negative for dysuria, frequency and urgency.  Musculoskeletal:  Negative for back pain and falls.  Skin: Negative.  Negative for rash.  Neurological:  Positive for weakness. Negative for headaches.  Endo/Heme/Allergies: Negative.  Does not bruise/bleed easily.  Psychiatric/Behavioral: Negative.  Negative for depression. The patient is not nervous/anxious and does not have insomnia.    As per HPI. Otherwise, a complete review of systems is negative.  PAST MEDICAL HISTORY: Past Medical History:  Diagnosis Date   Anxiety    Chronic combined systolic and diastolic CHF (congestive heart failure) (HCC)    Chronic kidney disease    Coronary artery disease    Depression    Diabetes mellitus without complication (HCC)    Diabetes mellitus, type II ([place])    Hypertension    MI (myocardial infarction) ([place])    x 5   Pacemaker    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[place])    Prolonged Q-T interval on ECG    Thyroid disease     PAST SURGICAL HISTORY: Past Surgical History:  Procedure Laterality Date   CENTRAL LINE INSERTION  03/11/2017   Procedure: CENTRAL LINE INSERTION;  Surgeon: Leonie Man, MD;  Location: [place] [facility];  Service: Cardiovascular;;   CHOLECYSTECTOMY     COLONOSCOPY WITH PROPOFOL N/A 09/01/2019   Procedure: COLONOSCOPY WITH PROPOFOL;  Surgeon: Toledo, Benay Pike, MD;  Location: [facility];  Service: Gastroenterology;  Laterality: N/A;   CORONARY STENT INTERVENTION W/IMPELLA N/A 03/11/2017   Procedure: Coronary Stent Intervention w/Impella;  Surgeon:  Leonie Man, MD;  Location: [place] [facility];  Service: Cardiovascular;  Laterality: N/A;   ESOPHAGOGASTRODUODENOSCOPY (EGD) WITH PROPOFOL N/A 09/01/2019   Procedure: ESOPHAGOGASTRODUODENOSCOPY (EGD) WITH PROPOFOL;  Surgeon: Toledo, Benay Pike, MD;  Location: [facility];  Service:  Gastroenterology;  Laterality: N/A;   ESOPHAGOGASTRODUODENOSCOPY (EGD) WITH PROPOFOL N/A 09/08/2019   Procedure: ESOPHAGOGASTRODUODENOSCOPY (EGD) WITH PROPOFOL;  Surgeon: [name], MD;  Location: [facility];  Service: Gastroenterology;  Laterality: N/A;   EYE SURGERY     HIP ARTHROPLASTY Right 03/29/2020   Procedure: ARTHROPLASTY BIPOLAR HIP (HEMIARTHROPLASTY);  Surgeon: Corky Mull, MD;  Location: [facility];  Service: Orthopedics;  Laterality: Right;   INTRAVASCULAR PRESSURE WIRE/FFR STUDY N/A 03/11/2017   Procedure: INTRAVASCULAR PRESSURE WIRE/FFR STUDY;  Surgeon: Leonie Man, MD;  Location: [place] [facility];  Service: Cardiovascular;  Laterality: N/A;   INTRAVASCULAR ULTRASOUND/IVUS N/A 03/11/2017   Procedure: Intravascular Ultrasound/IVUS;  Surgeon: Leonie Man, MD;  Location: [place] [facility];  Service: Cardiovascular;  Laterality: N/A;   LEFT HEART CATH AND CORONARY ANGIOGRAPHY N/A 03/05/2017   Procedure: LEFT HEART CATH AND CORONARY ANGIOGRAPHY;  Surgeon: Isaias Cowman, MD;  Location: [place] [facility];  Service: Cardiovascular;  Laterality: N/A;   PACEMAKER IMPLANT     pacemaker/defibrillator Left     FAMILY HISTORY: Family History  Problem Relation Age of Onset   Hypertension Father    Heart attack Father    Depression Sister    Depression Brother    Depression Brother     ADVANCED DIRECTIVES (Y/N):  @ADVDIR @  HEALTH MAINTENANCE: Social History   Tobacco Use   Smoking status: Every Day    Packs/day: 0.25    Types: E-cigarettes, Cigarettes   Smokeless tobacco: Never  Vaping Use   Vaping Use: Former  Substance Use Topics   Alcohol use: Not Currently    Comment: occasionally   Drug use: Yes    Comment: prescribed pain meds     Colonoscopy:  PAP:  Bone density:  Lipid panel:  Allergies  Allergen Reactions   Celebrex [Celecoxib] Anaphylaxis   Glipizide Anaphylaxis   Levaquin [Levofloxacin In D5w] Other (See Comments)    Heart  arrhthymias   Levofloxacin Other (See Comments) and Palpitations    ICD fired   Lisinopril Swelling    Lip and facial swelling   Sulfa Antibiotics Other (See Comments) and Anaphylaxis    Reaction: unknown   Metformin Other (See Comments)    Gi tolerance    Penicillins Rash and Other (See Comments)    Has patient had a PCN reaction causing immediate rash, facial/tongue/throat swelling, SOB or lightheadedness with hypotension: Unknown Has patient had a PCN reaction causing severe rash involving mucus membranes or skin necrosis: No Has patient had a PCN reaction that required hospitalization: No Has patient had a PCN reaction occurring within the last 10 years: No If all of the above answers are "NO", then may proceed with Cephalosporin use.     Current Facility-Administered Medications  Medication Dose Route Frequency Provider Last Rate Last Admin   0.9 %  sodium chloride infusion  10 mL/hr Intravenous Once Blake Divine, MD   Held at 02/12/21 1027   acidophilus (RISAQUAD) capsule 1 capsule  1 capsule Oral Daily Agbata, Tochukwu, MD       allopurinol (ZYLOPRIM) tablet 200 mg  200 mg Oral Daily Agbata, Tochukwu, MD       aspirin EC tablet 81 mg  81 mg Oral QHS Collier Bullock, MD       [  START ON 02/13/2021] ceFEPIme (MAXIPIME) 2 g in sodium chloride 0.9 % 100 mL IVPB  2 g Intravenous Once Agbata, Tochukwu, MD       dexamethasone (DECADRON) tablet 20 mg  20 mg Oral Daily Agbata, Tochukwu, MD       docusate sodium (COLACE) capsule 100 mg  100 mg Oral BID Agbata, Tochukwu, MD       FLUoxetine (PROZAC) capsule 10 mg  10 mg Oral Daily Agbata, Tochukwu, MD       gabapentin (NEURONTIN) capsule 100 mg  100 mg Oral Daily Agbata, Tochukwu, MD       And   gabapentin (NEURONTIN) capsule 700 mg  700 mg Oral QHS Agbata, Tochukwu, MD       [START ON 02/13/2021] levothyroxine (SYNTHROID) tablet 50 mcg  50 mcg Oral Q0600 Agbata, Tochukwu, MD       loratadine (CLARITIN) tablet 10 mg  10 mg Oral Daily  Agbata, Tochukwu, MD       magnesium oxide (MAG-OX) tablet 200 mg  200 mg Oral Daily Agbata, Tochukwu, MD       metroNIDAZOLE (FLAGYL) IVPB 500 mg  500 mg Intravenous Once Agbata, Tochukwu, MD       mexiletine (MEXITIL) capsule 200 mg  200 mg Oral Q12H Agbata, Tochukwu, MD       midodrine (PROAMATINE) tablet 10 mg  10 mg Oral BID WC Agbata, Tochukwu, MD       [START ON 02/13/2021] pantoprazole (PROTONIX) injection 40 mg  40 mg Intravenous Q24H Agbata, Tochukwu, MD       polyethylene glycol (MIRALAX / GLYCOLAX) packet 17 g  17 g Oral Daily Agbata, Tochukwu, MD       protein supplement (ENSURE MAX) liquid  11 oz Oral BID BM Agbata, Tochukwu, MD       simvastatin (ZOCOR) tablet 20 mg  20 mg Oral QHS Agbata, Tochukwu, MD       traZODone (DESYREL) tablet 50 mg  50 mg Oral QHS Agbata, Tochukwu, MD       zolpidem (AMBIEN) tablet 5 mg  5 mg Oral QHS PRN Agbata, Tochukwu, MD       Current Outpatient Medications  Medication Sig Dispense Refill   allopurinol (ZYLOPRIM) 100 MG tablet Take 200 mg by mouth daily.     aspirin EC 81 MG tablet Take 81 mg by mouth at bedtime.      docusate sodium (COLACE) 100 MG capsule Take 100 mg by mouth 2 (two) times daily.     fexofenadine (ALLEGRA) 180 MG tablet Take 180 mg by mouth daily.     FLUoxetine (PROZAC) 10 MG capsule Take 10 mg by mouth daily.     gabapentin (NEURONTIN) 100 MG capsule TAKE 1 CAPSULE(100 MG) BY MOUTH THREE TIMES DAILY (Patient taking differently: Patient reports 100 mg QAM, 700 mg QHS) 270 capsule 1   levothyroxine (SYNTHROID) 50 MCG tablet Take by mouth.     levothyroxine (SYNTHROID) 75 MCG tablet Take 1 tablet (75 mcg total) by mouth daily before breakfast. On Monday, Wednesday and Friday     magnesium oxide (MAG-OX) 400 MG tablet Take 200 mg by mouth daily.     mexiletine (MEXITIL) 200 MG capsule Take 1 capsule (200 mg total) by mouth every 12 (twelve) hours. 60 capsule 1   pantoprazole (PROTONIX) 40 MG tablet Take 40 mg by mouth daily.       polyethylene glycol (MIRALAX) 17 g packet Take 17 g by mouth daily. 14 each 0  Probiotic Product (PROBIOTIC DAILY PO) Take 1 tablet by mouth daily.     simvastatin (ZOCOR) 40 MG tablet Take 20 mg by mouth at bedtime.   2   spironolactone (ALDACTONE) 25 MG tablet Take 0.5 tablets (12.5 mg total) by mouth every other day. (Patient taking differently: Take 25 mg by mouth daily.) 15 tablet 3   traZODone (DESYREL) 50 MG tablet Take 50 mg by mouth at bedtime.     dexamethasone (DECADRON) 4 MG tablet Take 5 tablets (20 mg total) by mouth daily. Take the day after Velcade on days 2,5,9,12. Take with breakfast 40 tablet 3   Ensure Max Protein (ENSURE MAX PROTEIN) LIQD Take 330 mLs (11 oz total) by mouth 2 (two) times daily between meals.     gabapentin (NEURONTIN) 300 MG capsule Take 370m in AM, 3056min afternoon, 90047mt night (Patient not taking: No sig reported) 150 capsule 3   gabapentin (NEURONTIN) 600 MG tablet Take 600 mg by mouth daily. Patient takes 100 mg QAM, 700 mg QHS (Patient not taking: No sig reported)     HYDROcodone-acetaminophen (NORCO/VICODIN) 5-325 MG tablet  (Patient not taking: No sig reported)     hydrocortisone (ANUSOL-HC) 2.5 % rectal cream Apply 1 application topically 2 (two) times daily.     Lactulose 20 GM/30ML SOLN Take 30 mLs (20 g total) by mouth in the morning and at bedtime. 450 mL 1   midodrine (PROAMATINE) 10 MG tablet Take 1 tablet (10 mg total) by mouth 2 (two) times daily with a meal.     OXYGEN Inhale 2 L into the lungs continuous.     potassium chloride SA (KLOR-CON) 20 MEQ tablet Take 20 mEq by mouth daily. (Patient not taking: No sig reported)     Prenatal Vit-Fe Fumarate-FA (PRENATAL MULTIVITAMIN) TABS tablet Take 1 tablet by mouth daily. (Patient not taking: No sig reported)     SANTYL ointment Apply topically.     torsemide (DEMADEX) 20 MG tablet Take 40 mg by mouth daily.     zolpidem (AMBIEN) 5 MG tablet Take 1 tablet (5 mg total) by mouth at bedtime as  needed for sleep. (Patient not taking: No sig reported) 30 tablet 0    OBJECTIVE: Vitals:   02/12/21 1423 02/12/21 1447  BP: (!) 102/57 (!) 96/51  Pulse: 87 87  Resp: 12 12  Temp: 97.6 F (36.4 C) 97.6 F (36.4 C)  SpO2: 100% 100%     There is no height or weight on file to calculate BMI.    ECOG FS:3 - Symptomatic, >50% confined to bed  Physical Exam Constitutional:      Appearance: Normal appearance. She is obese.  HENT:     Head: Normocephalic and atraumatic.  Eyes:     Pupils: Pupils are equal, round, and reactive to light.  Cardiovascular:     Rate and Rhythm: Normal rate and regular rhythm.     Heart sounds: Normal heart sounds. No murmur heard. Pulmonary:     Effort: Pulmonary effort is normal.     Breath sounds: Normal breath sounds. No wheezing.  Abdominal:     General: Bowel sounds are normal. There is no distension.     Palpations: Abdomen is soft.     Tenderness: There is no abdominal tenderness.  Musculoskeletal:        General: Normal range of motion.     Cervical back: Normal range of motion.  Skin:    General: Skin is warm and dry.  Coloration: Skin is pale.     Findings: No rash.  Neurological:     Mental Status: She is alert and oriented to person, place, and time.  Psychiatric:        Judgment: Judgment normal.     LAB RESULTS:  Lab Results  Component Value Date   NA 132 (L) 02/12/2021   K 5.5 (H) 02/12/2021   CL 94 (L) 02/12/2021   CO2 27 02/12/2021   GLUCOSE 353 (H) 02/12/2021   BUN 135 (H) 02/12/2021   CREATININE 2.00 (H) 02/12/2021   CALCIUM 9.2 02/12/2021   PROT 6.6 02/12/2021   ALBUMIN 3.8 02/12/2021   AST 23 02/12/2021   ALT 19 02/12/2021   ALKPHOS 68 02/12/2021   BILITOT 0.8 02/12/2021   GFRNONAA 25 (L) 02/12/2021   GFRAA 30 (L) 09/09/2019    Lab Results  Component Value Date   WBC 23.7 (H) 02/12/2021   NEUTROABS 18.9 (H) 02/12/2021   HGB 6.0 (L) 02/12/2021   HCT 19.5 (L) 02/12/2021   MCV 84.1 02/12/2021   PLT  111 (L) 02/12/2021     STUDIES: CT Abdomen Pelvis Wo Contrast  Result Date: 02/12/2021 CLINICAL DATA:  Generalized weakness and pain. History of multiple myeloma. Recent fall. EXAM: CT ABDOMEN AND PELVIS WITHOUT CONTRAST TECHNIQUE: Multidetector CT imaging of the abdomen and pelvis was performed following the standard protocol without IV contrast. COMPARISON:  CT abdomen and pelvis 06/18/2020 FINDINGS: Lower chest: Stable cardiomegaly. Hypodense blood pool, suggestive of edema anemia. Partially visualized pacemaker lead in the right ventricle. Coronary artery stent. No abnormal pericardial fluid. Scattered atherosclerosis of the descending thoracic aorta. Visualized lung bases are clear. Hepatobiliary: Mild surface nodularity. No focal liver abnormality is seen. Status post cholecystectomy. No biliary dilatation. Pancreas: Diffuse mild parenchymal atrophy and fatty infiltration. The main pancreatic duct is not dilated. No peripancreatic fluid collections. Spleen: Normal in size. A few calcifications are noted in the lateral aspect of the spleen, similar to prior studies. Adrenals/Urinary Tract: Stable low-attenuation (Hounsfield units -17 and -19) bilateral adrenal masses consistent with adrenal adenomas, unchanged compared to 03/02/2019. Stable cortical scarring at the posterior upper poles of the kidneys. No hydronephrosis. Bilateral nonobstructive nephrolithiasis also unchanged compared to 03/02/2019. Stomach/Bowel: Stomach is within normal limits. The appendix is not directly visualized, however no pericecal inflammatory changes. Diffuse diverticulosis involving the descending and sigmoid colon. No evidence of bowel wall thickening, distention, or inflammatory changes. Vascular/Lymphatic: Severe calcific atherosclerosis involving the abdominal aorta and common iliac arteries. Severe focal narrowing in the infrarenal abdominal aorta as well as the proximal left common iliac artery appear similar to  03/01/2021 the evaluation is limited due to lack of intravenous contrast. Scattered calcific atherosclerosis of the external iliac arteries. No lymphadenopathy in the abdomen or pelvis. Reproductive: Uterus and bilateral adnexa are unremarkable. Other: No abdominal wall hernia or abnormality. No abdominopelvic ascites. Musculoskeletal: Right hip prosthesis is intact. Multilevel degenerative disc disease in the visualized distal thoracic and lumbar spine, most severe at L4-L5 with vacuum disc phenomenon. No new acute or significant osseous finding. IMPRESSION: 1. No acute abnormality in the abdomen or pelvis. 2. Mild cirrhotic changes of the liver, likely secondary to CHF. 3. Diverticulosis of the descending and sigmoid colon without findings of diverticulitis. 4. Stable bilateral adrenal adenomas. 5. Aortic Atherosclerosis (ICD10-I70.0), severe. Electronically Signed   By: Ileana Roup M.D.   On: 02/12/2021 09:50   CT Head Wo Contrast  Result Date: 02/12/2021 CLINICAL DATA:  Generalized weakness and pain. Patient  receives injections for multiple myeloma. There are multiple skin tears and bruises to both arms. Patient fell Saturday morning due to weakness. EXAM: CT HEAD WITHOUT CONTRAST CT CERVICAL SPINE WITHOUT CONTRAST TECHNIQUE: Multidetector CT imaging of the head and cervical spine was performed following the standard protocol without intravenous contrast. Multiplanar CT image reconstructions of the cervical spine were also generated. COMPARISON:  Prior exams, most recent head CT, 12/15/2020. FINDINGS: CT HEAD FINDINGS Brain: No evidence of acute infarction, hemorrhage, hydrocephalus, extra-axial collection or mass lesion/mass effect. Encephalomalacia of the right frontal lobe consistent with an old infarct. Patchy bilateral white matter hypoattenuation noted consistent with moderate chronic microvascular ischemic change. Ventricular and sulcal enlargement also noted consistent with mild diffuse atrophy.  These findings are stable. Vascular: No hyperdense vessel or unexpected calcification. Skull: Normal. Negative for fracture or focal lesion. Sinuses/Orbits: Visualized globes and orbits are unremarkable. Visualized sinuses are clear. Other: None. CT CERVICAL SPINE FINDINGS Alignment: Slight reversal of the normal cervical lordosis. No spondylolisthesis. Skull base and vertebrae: No acute fracture. No primary bone lesion or focal pathologic process. Soft tissues and spinal canal: No prevertebral fluid or swelling. No visible canal hematoma. Disc levels: Mild loss of disc height at C3-C4. Remaining disc spaces are relatively well preserved. Facet degenerative change noted, most evident on the right at C2-C3 and C3-C4. No significant disc bulging or convincing disc herniation. Upper chest: No acute or significant abnormality. Other: None. IMPRESSION: HEAD CT 1. No acute intracranial abnormalities. 2. Old right frontal lobe infarct. Mild atrophy and moderate chronic microvascular ischemic change. CERVICAL CT 1. No fracture or acute finding. Electronically Signed   By: Lajean Manes M.D.   On: 02/12/2021 09:32   CT Cervical Spine Wo Contrast  Result Date: 02/12/2021 CLINICAL DATA:  Generalized weakness and pain. Patient receives injections for multiple myeloma. There are multiple skin tears and bruises to both arms. Patient fell Saturday morning due to weakness. EXAM: CT HEAD WITHOUT CONTRAST CT CERVICAL SPINE WITHOUT CONTRAST TECHNIQUE: Multidetector CT imaging of the head and cervical spine was performed following the standard protocol without intravenous contrast. Multiplanar CT image reconstructions of the cervical spine were also generated. COMPARISON:  Prior exams, most recent head CT, 12/15/2020. FINDINGS: CT HEAD FINDINGS Brain: No evidence of acute infarction, hemorrhage, hydrocephalus, extra-axial collection or mass lesion/mass effect. Encephalomalacia of the right frontal lobe consistent with an old  infarct. Patchy bilateral white matter hypoattenuation noted consistent with moderate chronic microvascular ischemic change. Ventricular and sulcal enlargement also noted consistent with mild diffuse atrophy. These findings are stable. Vascular: No hyperdense vessel or unexpected calcification. Skull: Normal. Negative for fracture or focal lesion. Sinuses/Orbits: Visualized globes and orbits are unremarkable. Visualized sinuses are clear. Other: None. CT CERVICAL SPINE FINDINGS Alignment: Slight reversal of the normal cervical lordosis. No spondylolisthesis. Skull base and vertebrae: No acute fracture. No primary bone lesion or focal pathologic process. Soft tissues and spinal canal: No prevertebral fluid or swelling. No visible canal hematoma. Disc levels: Mild loss of disc height at C3-C4. Remaining disc spaces are relatively well preserved. Facet degenerative change noted, most evident on the right at C2-C3 and C3-C4. No significant disc bulging or convincing disc herniation. Upper chest: No acute or significant abnormality. Other: None. IMPRESSION: HEAD CT 1. No acute intracranial abnormalities. 2. Old right frontal lobe infarct. Mild atrophy and moderate chronic microvascular ischemic change. CERVICAL CT 1. No fracture or acute finding. Electronically Signed   By: Lajean Manes M.D.   On: 02/12/2021 09:32  DG Chest Port 1 View  Result Date: 02/12/2021 CLINICAL DATA:  Questionable sepsis.  Weakness. EXAM: PORTABLE CHEST 1 VIEW COMPARISON:  06/25/2020 FINDINGS: Dual-chamber ICD leads from the left. Cardiomegaly. Coronary stenting. There is no edema, consolidation, effusion, or pneumothorax. IMPRESSION: No evidence of active disease.  Chronic cardiomegaly. Electronically Signed   By: Jorje Guild M.D.   On: 02/12/2021 06:55    ASSESSMENT: Heather Peterson is a 80 year old female who presents to the emergency room for symptomatic anemia.  Hemoglobin was found to be 6.0.  She is currently receiving 1 unit  PRBC.  PLAN:   Patient will be admitted to closely monitor her lab work.  She is receiving 1 unit PRBC and anticipate additional labs this evening.  Once lab work has stabilized, patient will likely have upper endoscopy to evaluate for active bleed.  Oncology will follow.   Continue plan of care per hospitalist and GI.  No new orders.  I spent 15 minutes dedicated to the care of this patient (face-to-face and non-face-to-face) on the date of the encounter to include what is described in the assessment and plan.   Patient expressed understanding and was in agreement with this plan. She also understands that She can call clinic at any time with any questions, concerns, or complaints.   Cancer Staging Multiple myeloma ([place]) Staging form: Plasma Cell Myeloma and Plasma Cell Disorders, AJCC 8th Edition - Clinical stage from 02/06/2021: Albumin (g/dL): 4.4, ISS: Stage II, High-risk cytogenetics: Absent, LDH: Normal - Signed by Lloyd Huger, MD on 02/06/2021 Albumin range (g/dL): Greater than or equal to 3.5 Cytogenetics: No abnormalities Serum calcium level: Normal Serum creatinine level: Elevated   Jacquelin Hawking, NP   02/12/2021 3:15 PM

## 2021-02-12 NOTE — ED Notes (Signed)
Meal tray at bedside.  

## 2021-02-12 NOTE — ED Notes (Signed)
IV team at bedside 

## 2021-02-12 NOTE — ED Notes (Signed)
Agbata MD at bedside

## 2021-02-12 NOTE — ED Notes (Signed)
Purewick placed at this time.

## 2021-02-12 NOTE — Sepsis Progress Note (Signed)
[facility] is following this code sepsis ?

## 2021-02-12 NOTE — ED Notes (Signed)
Attempted to contact Pts husband Barbaraann Rondo for update with no success at this time.

## 2021-02-12 NOTE — ED Triage Notes (Addendum)
Patient arrives via EMS from home with c/c of generalized weakness and pain. Patient states she is currently receiving injections for Multiple Myeloma, last one being Friday. Pt states she had a fall Saturday morning due to the weakness, no LOC. Pt has multiple skin tears and bruises to bilateral arms

## 2021-02-12 NOTE — ED Notes (Signed)
Pt given chicken broth and apple juice at this time.

## 2021-02-12 NOTE — ED Notes (Signed)
IV team at the bedside. 

## 2021-02-13 ENCOUNTER — Other Ambulatory Visit: Payer: Self-pay

## 2021-02-13 DIAGNOSIS — D649 Anemia, unspecified: Secondary | ICD-10-CM | POA: Diagnosis not present

## 2021-02-13 LAB — CBC
HCT: 21.4 % — ABNORMAL LOW (ref 36.0–46.0)
Hemoglobin: 7.1 g/dL — ABNORMAL LOW (ref 12.0–15.0)
MCH: 28.3 pg (ref 26.0–34.0)
MCHC: 33.2 g/dL (ref 30.0–36.0)
MCV: 85.3 fL (ref 80.0–100.0)
Platelets: 82 10*3/uL — ABNORMAL LOW (ref 150–400)
RBC: 2.51 MIL/uL — ABNORMAL LOW (ref 3.87–5.11)
RDW: 17 % — ABNORMAL HIGH (ref 11.5–15.5)
WBC: 20 10*3/uL — ABNORMAL HIGH (ref 4.0–10.5)
nRBC: 1.3 % — ABNORMAL HIGH (ref 0.0–0.2)

## 2021-02-13 LAB — URINE CULTURE: Culture: <10000 — AB

## 2021-02-13 LAB — BASIC METABOLIC PANEL
Anion gap: 9 (ref 5–15)
BUN: 86 mg/dL — ABNORMAL HIGH (ref 8–23)
CO2: 23 mmol/L (ref 22–32)
Calcium: 8.9 mg/dL (ref 8.9–10.3)
Chloride: 106 mmol/L (ref 98–111)
Creatinine, Ser: 1.39 mg/dL — ABNORMAL HIGH (ref 0.44–1.00)
GFR, Estimated: 39 mL/min — ABNORMAL LOW (ref >60)
Glucose, Bld: 207 mg/dL — ABNORMAL HIGH (ref 70–99)
Potassium: 5.1 mmol/L (ref 3.5–5.1)
Sodium: 138 mmol/L (ref 135–145)

## 2021-02-13 LAB — PROCALCITONIN: Procalcitonin: 0.17 ng/mL

## 2021-02-13 MED ORDER — VANCOMYCIN HCL 1250 MG/250ML IV SOLN: 1250.0000 mg | INTRAVENOUS | Status: DC

## 2021-02-13 NOTE — Progress Notes (Signed)
PROGRESS NOTE    Heather Peterson  DUK:[id number] DOB: [birth year] DOA: 02/12/2021 PCP: [name], MD  [id number]   Assessment & Plan:   Principal Problem:   Symptomatic anemia Active Problems:   CKD stage 4 due to type 2 diabetes mellitus (HCC)   Chronic systolic CHF (congestive heart failure) (HCC)   ICD (implantable cardioverter-defibrillator) in place   Anemia in chronic kidney disease (CKD)   Multiple myeloma (HCC)   Hyperkalemia   Chronic respiratory failure (HCC)   Leukocytosis   Lactic acid acidosis   Heather Peterson is a 80 y.o. 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  Patient states that she fell on the morning of her admission but denies hitting her head or any loss of consciousness.   acute on chronic anemia  Hx of AVM treated in 2021 --Hgb appeared to be gradually trending down, 6.0 on presentation. --likely due to upper GI bleed, since BUN 109 markedly elevated.   --s/p 1u pRBC with appropriate rise to 7.1 Plan: --Monitor and transfuse if Hgb <7 --increase IV PPI to BID --NPO MN in case GI will take pt for EGD   Diabetes mellitus with complications of stage IV chronic kidney disease --No hypoglycemics listed on home med --BG's have been elevated  Plan: --SSI TID for now  AKI on CKD4 --Cr 2.0 on presentation.  Improved to 1.39 with IVF. --torsemide and spironolactone held on presentation. --d/c further IVF, allow oral hydration.   Hyperkalemia, resolved Probably medication induced. Without EKG changes --s/p dextrose and insulin --hold spironolactone   History of  chronic systolic heart failure Last known LVEF of 25% from February, 2022 --not fluid overloaded --continue to hold torsemide and spironolactone due to AKI and hypotension  Hypotension --pt has midodrine and torsemide and spironolactone on home med list Plan: --cont midodrine --continue to hold torsemide and spironolactone   History of multiple myeloma On chemotherapy Follow-up with oncology as an outpatient   Sepsis, ruled out --Leukocytosis likely reactive.  No fever.  Procal 0.17.  No source of infection currently. --Patient started on broad-spectrum antibiotic therapy in the ER with vancomycin, cefepime and Flagyl --d/c abx and monitor   DVT prophylaxis: SCD/Compression stockings Code Status: Full code  Family Communication: son updated on the phone today  Level of care: Med-Surg Dispo:   The patient is from: home Anticipated d/c is to: undetermined Anticipated d/c date is: 1-2 days Patient currently is not medically ready to d/c due to: pending EGD   Subjective and Interval History:  Pt reported not feeling well, but couldn't elaborate why.  Has not noted blood in her stool.   Objective: Vitals:   02/13/21 1300 02/13/21 1400 02/13/21 1505 02/13/21 1534  BP: 110/64 110/66 110/64   Pulse: 83 87 85   Resp: 15 13 18    Temp:   97.8 F (36.6 C)   TempSrc:   Oral   SpO2: 100% 100% 100%   Weight:    74.1 kg  Height:    5' 3"  (1.6 m)    Intake/Output Summary (Last 24 hours) at 02/14/2021 0115 Last data filed at 02/13/2021 1021 Gross per 24 hour  Intake --  Output 1000 ml  Net -1000 ml   Filed Weights   02/13/21 1534  Weight: 74.1 kg    Examination:   Constitutional: NAD, AAOx3 HEENT: conjunctivae and lids normal, EOMI CV: No cyanosis.   RESP: normal respiratory effort, on 2L sating 100% Extremities: mild edema in BLE SKIN: warm, dry, scattered bruising Neuro: II - XII grossly intact.   Psych: depressed mood and affect.     Data Reviewed: I have  personally reviewed following labs and imaging studies  CBC: Recent Labs  Lab 02/12/21 0648 02/13/21 0655  WBC 23.7* 20.0*  NEUTROABS 18.9*  --   HGB 6.0* 7.1*  HCT 19.5* 21.4*  MCV 84.1 85.3  PLT 111* 82*   Basic Metabolic Panel: Recent Labs  Lab 02/12/21 0617 02/12/21 2021 02/13/21 0655  NA 132* 136 138  K 5.5* 5.1 5.1  CL 94* 106 106  CO2 27 21* 23  GLUCOSE 353* 204* 207*  BUN 135* 109* 86*  CREATININE 2.00* 1.48* 1.39*  CALCIUM 9.2 8.7* 8.9   GFR: Estimated Creatinine Clearance: 31.7 mL/min (A) (by C-G formula based on SCr of 1.39 mg/dL (H)). Liver Function Tests: Recent Labs  Lab 02/12/21 0617  AST 23  ALT 19  ALKPHOS 68  BILITOT 0.8  PROT 6.6  ALBUMIN 3.8   No results for input(s): LIPASE, AMYLASE in the last 168 hours. No results for input(s): AMMONIA in the last 168 hours. Coagulation Profile: Recent Labs  Lab 02/12/21 0803  INR 1.2   Cardiac Enzymes: No results for input(s): CKTOTAL, CKMB, CKMBINDEX, TROPONINI in the last 168 hours. BNP (last 3 results) No results for input(s): PROBNP in the last 8760 hours. HbA1C: No results for input(s): HGBA1C in the last 72 hours. CBG: No results for input(s): GLUCAP in the last 168 hours. Lipid Profile: No results for input(s): CHOL, HDL, LDLCALC, TRIG, CHOLHDL, LDLDIRECT in the last 72 hours. Thyroid Function Tests: No results for input(s): TSH, T4TOTAL, FREET4, T3FREE, THYROIDAB in the last 72 hours. Anemia Panel: Recent Labs    02/12/21 0617  TIBC 570*  IRON 43   Sepsis Labs: Recent Labs  Lab 02/12/21 0617 02/12/21 0950 02/12/21 1606 02/12/21 2021 02/13/21 0725  PROCALCITON  --   --   --   --  0.17  LATICACIDVEN 2.9* 2.6* 2.8* 2.1*  --     Recent Results (from the past 240 hour(s))  Blood Culture (routine x 2)     Status: None (Preliminary result)   Collection Time: 02/12/21  6:48 AM   Specimen: BLOOD  Result Value Ref Range Status   Specimen Description BLOOD LEFT ARM  Final    Special Requests   Final    BOTTLES DRAWN AEROBIC AND ANAEROBIC Blood Culture adequate volume   Culture   Final    NO GROWTH < 24 HOURS Performed at [facility], [address] [place], [place] [address]    Report Status PENDING  Incomplete  Blood Culture (routine x 2)     Status: None (Preliminary result)   Collection Time: 02/12/21  6:48 AM   Specimen: BLOOD LEFT HAND  Result Value Ref Range Status   Specimen Description BLOOD LEFT HAND  Final   Special Requests   Final    BOTTLES DRAWN AEROBIC ONLY Blood Culture adequate volume   Culture   Final    NO GROWTH < 24 HOURS Performed at [facility], [address] [place],  [address]    Report Status PENDING  Incomplete  Resp Panel by RT-PCR (Flu A&B, Covid) Nasopharyngeal Swab     Status: None   Collection Time: 02/12/21  7:20 AM   Specimen: Nasopharyngeal Swab; Nasopharyngeal(NP) swabs in vial transport medium  Result Value Ref Range Status   SARS Coronavirus 2 by RT PCR NEGATIVE NEGATIVE Final    Comment: (NOTE) SARS-CoV-2 target nucleic acids are NOT DETECTED.  The SARS-CoV-2 RNA is generally detectable in upper respiratory specimens during the acute phase of infection. The lowest concentration of SARS-CoV-2 viral copies this assay can detect is 138 copies/mL. A negative result does not preclude SARS-Cov-2 infection and should not be used as the sole basis for treatment or other patient management decisions. A negative result may occur with  improper specimen collection/handling, submission of specimen other than nasopharyngeal swab, presence of viral mutation(s) within the areas targeted by this assay, and inadequate number of viral copies(<138 copies/mL). A negative result must be combined with clinical observations, patient history, and epidemiological information. The expected result is Negative.  Fact Sheet for Patients:  EntrepreneurPulse.com.au  Fact Sheet for  Healthcare Providers:  IncredibleEmployment.be  This test is no t yet approved or cleared by the [place] FDA and  has been authorized for detection and/or diagnosis of SARS-CoV-2 by FDA under an Emergency Use Authorization (EUA). This EUA will remain  in effect (meaning this test can be used) for the duration of the COVID-19 declaration under Section 564(b)(1) of the Act, 21 U.S.C.section 360bbb-3(b)(1), unless the authorization is terminated  or revoked sooner.       Influenza A by PCR NEGATIVE NEGATIVE Final   Influenza B by PCR NEGATIVE NEGATIVE Final    Comment: (NOTE) The Xpert Xpress SARS-CoV-2/FLU/RSV plus assay is intended as an aid in the diagnosis of influenza from Nasopharyngeal swab specimens and should not be used as a sole basis for treatment. Nasal washings and aspirates are unacceptable for Xpert Xpress SARS-CoV-2/FLU/RSV testing.  Fact Sheet for Patients: EntrepreneurPulse.com.au  Fact Sheet for Healthcare Providers: IncredibleEmployment.be  This test is not yet approved or cleared by the [place] FDA and has been authorized for detection and/or diagnosis of SARS-CoV-2 by FDA under an Emergency Use Authorization (EUA). This EUA will remain in effect (meaning this test can be used) for the duration of the COVID-19 declaration under Section 564(b)(1) of the Act, 21 U.S.C. section 360bbb-3(b)(1), unless the authorization is terminated or revoked.  Performed at [facility], [address], [place], [place] [address]   Urine Culture     Status: Abnormal   Collection Time: 02/12/21  1:30 PM   Specimen: Urine, Random  Result Value Ref Range Status   Specimen Description   Final    URINE, RANDOM Performed at [facility], [address], [place], [place] [address]    Special Requests   Final    NONE Performed at [facility], [address] [place], [place]  [address]    Culture (A)  Final    <10,000 COLONIES/mL INSIGNIFICANT GROWTH Performed at [facility], [place] [address], [place], [place] [address]    Report Status 02/13/2021 FINAL  Final      Radiology Studies: CT Abdomen Pelvis Wo Contrast  Result Date: 02/12/2021 CLINICAL DATA:  Generalized weakness and pain. History of multiple myeloma. Recent fall. EXAM: CT ABDOMEN AND PELVIS WITHOUT CONTRAST TECHNIQUE: Multidetector CT imaging of the abdomen and pelvis was performed following the standard protocol without IV contrast. COMPARISON:  CT abdomen and pelvis 06/18/2020  FINDINGS: Lower chest: Stable cardiomegaly. Hypodense blood pool, suggestive of edema anemia. Partially visualized pacemaker lead in the right ventricle. Coronary artery stent. No abnormal pericardial fluid. Scattered atherosclerosis of the descending thoracic aorta. Visualized lung bases are clear. Hepatobiliary: Mild surface nodularity. No focal liver abnormality is seen. Status post cholecystectomy. No biliary dilatation. Pancreas: Diffuse mild parenchymal atrophy and fatty infiltration. The main pancreatic duct is not dilated. No peripancreatic fluid collections. Spleen: Normal in size. A few calcifications are noted in the lateral aspect of the spleen, similar to prior studies. Adrenals/Urinary Tract: Stable low-attenuation (Hounsfield units -17 and -19) bilateral adrenal masses consistent with adrenal adenomas, unchanged compared to 03/02/2019. Stable cortical scarring at the posterior upper poles of the kidneys. No hydronephrosis. Bilateral nonobstructive nephrolithiasis also unchanged compared to 03/02/2019. Stomach/Bowel: Stomach is within normal limits. The appendix is not directly visualized, however no pericecal inflammatory changes. Diffuse diverticulosis involving the descending and sigmoid colon. No evidence of bowel wall thickening, distention, or inflammatory changes. Vascular/Lymphatic: Severe calcific atherosclerosis  involving the abdominal aorta and common iliac arteries. Severe focal narrowing in the infrarenal abdominal aorta as well as the proximal left common iliac artery appear similar to 03/01/2021 the evaluation is limited due to lack of intravenous contrast. Scattered calcific atherosclerosis of the external iliac arteries. No lymphadenopathy in the abdomen or pelvis. Reproductive: Uterus and bilateral adnexa are unremarkable. Other: No abdominal wall hernia or abnormality. No abdominopelvic ascites. Musculoskeletal: Right hip prosthesis is intact. Multilevel degenerative disc disease in the visualized distal thoracic and lumbar spine, most severe at L4-L5 with vacuum disc phenomenon. No new acute or significant osseous finding. IMPRESSION: 1. No acute abnormality in the abdomen or pelvis. 2. Mild cirrhotic changes of the liver, likely secondary to CHF. 3. Diverticulosis of the descending and sigmoid colon without findings of diverticulitis. 4. Stable bilateral adrenal adenomas. 5. Aortic Atherosclerosis (ICD10-I70.0), severe. Electronically Signed   By: Ileana Roup M.D.   On: 02/12/2021 09:50   CT Head Wo Contrast  Result Date: 02/12/2021 CLINICAL DATA:  Generalized weakness and pain. Patient receives injections for multiple myeloma. There are multiple skin tears and bruises to both arms. Patient fell Saturday morning due to weakness. EXAM: CT HEAD WITHOUT CONTRAST CT CERVICAL SPINE WITHOUT CONTRAST TECHNIQUE: Multidetector CT imaging of the head and cervical spine was performed following the standard protocol without intravenous contrast. Multiplanar CT image reconstructions of the cervical spine were also generated. COMPARISON:  Prior exams, most recent head CT, 12/15/2020. FINDINGS: CT HEAD FINDINGS Brain: No evidence of acute infarction, hemorrhage, hydrocephalus, extra-axial collection or mass lesion/mass effect. Encephalomalacia of the right frontal lobe consistent with an old infarct. Patchy bilateral white  matter hypoattenuation noted consistent with moderate chronic microvascular ischemic change. Ventricular and sulcal enlargement also noted consistent with mild diffuse atrophy. These findings are stable. Vascular: No hyperdense vessel or unexpected calcification. Skull: Normal. Negative for fracture or focal lesion. Sinuses/Orbits: Visualized globes and orbits are unremarkable. Visualized sinuses are clear. Other: None. CT CERVICAL SPINE FINDINGS Alignment: Slight reversal of the normal cervical lordosis. No spondylolisthesis. Skull base and vertebrae: No acute fracture. No primary bone lesion or focal pathologic process. Soft tissues and spinal canal: No prevertebral fluid or swelling. No visible canal hematoma. Disc levels: Mild loss of disc height at C3-C4. Remaining disc spaces are relatively well preserved. Facet degenerative change noted, most evident on the right at C2-C3 and C3-C4. No significant disc bulging or convincing disc herniation. Upper chest: No acute or significant abnormality. Other: None.  IMPRESSION: HEAD CT 1. No acute intracranial abnormalities. 2. Old right frontal lobe infarct. Mild atrophy and moderate chronic microvascular ischemic change. CERVICAL CT 1. No fracture or acute finding. Electronically Signed   By: Lajean Manes M.D.   On: 02/12/2021 09:32   CT Cervical Spine Wo Contrast  Result Date: 02/12/2021 CLINICAL DATA:  Generalized weakness and pain. Patient receives injections for multiple myeloma. There are multiple skin tears and bruises to both arms. Patient fell Saturday morning due to weakness. EXAM: CT HEAD WITHOUT CONTRAST CT CERVICAL SPINE WITHOUT CONTRAST TECHNIQUE: Multidetector CT imaging of the head and cervical spine was performed following the standard protocol without intravenous contrast. Multiplanar CT image reconstructions of the cervical spine were also generated. COMPARISON:  Prior exams, most recent head CT, 12/15/2020. FINDINGS: CT HEAD FINDINGS Brain: No  evidence of acute infarction, hemorrhage, hydrocephalus, extra-axial collection or mass lesion/mass effect. Encephalomalacia of the right frontal lobe consistent with an old infarct. Patchy bilateral white matter hypoattenuation noted consistent with moderate chronic microvascular ischemic change. Ventricular and sulcal enlargement also noted consistent with mild diffuse atrophy. These findings are stable. Vascular: No hyperdense vessel or unexpected calcification. Skull: Normal. Negative for fracture or focal lesion. Sinuses/Orbits: Visualized globes and orbits are unremarkable. Visualized sinuses are clear. Other: None. CT CERVICAL SPINE FINDINGS Alignment: Slight reversal of the normal cervical lordosis. No spondylolisthesis. Skull base and vertebrae: No acute fracture. No primary bone lesion or focal pathologic process. Soft tissues and spinal canal: No prevertebral fluid or swelling. No visible canal hematoma. Disc levels: Mild loss of disc height at C3-C4. Remaining disc spaces are relatively well preserved. Facet degenerative change noted, most evident on the right at C2-C3 and C3-C4. No significant disc bulging or convincing disc herniation. Upper chest: No acute or significant abnormality. Other: None. IMPRESSION: HEAD CT 1. No acute intracranial abnormalities. 2. Old right frontal lobe infarct. Mild atrophy and moderate chronic microvascular ischemic change. CERVICAL CT 1. No fracture or acute finding. Electronically Signed   By: Lajean Manes M.D.   On: 02/12/2021 09:32   DG Chest Port 1 View  Result Date: 02/12/2021 CLINICAL DATA:  Questionable sepsis.  Weakness. EXAM: PORTABLE CHEST 1 VIEW COMPARISON:  06/25/2020 FINDINGS: Dual-chamber ICD leads from the left. Cardiomegaly. Coronary stenting. There is no edema, consolidation, effusion, or pneumothorax. IMPRESSION: No evidence of active disease.  Chronic cardiomegaly. Electronically Signed   By: Jorje Guild M.D.   On: 02/12/2021 06:55      Scheduled Meds:  acidophilus  1 capsule Oral Daily   allopurinol  200 mg Oral Daily   aspirin EC  81 mg Oral QHS   docusate sodium  100 mg Oral BID   FLUoxetine  10 mg Oral Daily   gabapentin  100 mg Oral Daily   And   gabapentin  700 mg Oral QHS   levothyroxine  50 mcg Oral Q0600   loratadine  10 mg Oral Daily   magnesium oxide  200 mg Oral Daily   mexiletine  200 mg Oral Q12H   midodrine  10 mg Oral BID WC   pantoprazole (PROTONIX) IV  40 mg Intravenous Q24H   polyethylene glycol  17 g Oral Daily   Ensure Max Protein  11 oz Oral BID BM   simvastatin  20 mg Oral QHS   traZODone  50 mg Oral QHS   Continuous Infusions:  sodium chloride Stopped (02/12/21 1027)     LOS: 2 days     Enzo Bi, MD [facility] If 7PM-7AM, please contact night-coverage 02/14/2021, 1:15 AM

## 2021-02-13 NOTE — Progress Notes (Signed)
[name] , MD [address], [place], [place], [place], [address] [address], [place], [place], [place], [address] Phone: [phone]  Fax: [phone]   Heather Peterson is being followed for anemia  Day 1 of follow up   Subjective: No bleeding or abdominal pain    Objective: Vital signs in last 24 hours: Vitals:   02/13/21 0900 02/13/21 1000 02/13/21 1200 02/13/21 1300  BP: (!) 91/53 (!) 86/49 103/78 110/64  Pulse: 85 91 89 83  Resp: _0 Temp:      TempSrc:      SpO2: 100% 99% 100% 100%   Weight change:   Intake/Output Summary (Last 24 hours) at 02/13/2021 1321 Last data filed at 02/13/2021 1021 Gross per 24 hour  Intake 1235.49 ml  Output 1000 ml  Net 235.49 ml     Exam:  Abdomen: soft, nontender, normal bowel sounds   Lab Results: _1 @ Micro Results: Recent Results (from the past 240 hour(s))  Blood Culture (routine x 2)     Status: None (Preliminary result)   Collection Time: 02/12/21  6:48 AM   Specimen: BLOOD  Result Value Ref Range Status   Specimen Description BLOOD LEFT ARM  Final   Special Requests   Final    BOTTLES DRAWN AEROBIC AND ANAEROBIC Blood Culture adequate volume   Culture   Final    NO GROWTH < 24 HOURS Performed at [facility], [address] [place], [place] [address]    Report Status PENDING  Incomplete  Blood Culture (routine x 2)     Status: None (Preliminary result)   Collection Time: 02/12/21  6:48 AM   Specimen: BLOOD LEFT HAND  Result Value Ref Range Status   Specimen Description BLOOD LEFT HAND  Final   Special Requests   Final    BOTTLES DRAWN AEROBIC ONLY Blood Culture adequate volume   Culture   Final    NO GROWTH < 24 HOURS Performed at [facility], [address], [place],  [address]    Report Status PENDING  Incomplete  Resp Panel by RT-PCR (Flu A&B, Covid) Nasopharyngeal Swab     Status: None   Collection Time: 02/12/21  7:20 AM   Specimen:  Nasopharyngeal Swab; Nasopharyngeal(NP) swabs in vial transport medium  Result Value Ref Range Status   SARS Coronavirus 2 by RT PCR NEGATIVE NEGATIVE Final    Comment: (NOTE) SARS-CoV-2 target nucleic acids are NOT DETECTED.  The SARS-CoV-2 RNA is generally detectable in upper respiratory specimens during the acute phase of infection. The lowest concentration of SARS-CoV-2 viral copies this assay can detect is 138 copies/mL. A negative result does not preclude SARS-Cov-2 infection and should not be used as the sole basis for treatment or other patient management decisions. A negative result may occur with  improper specimen collection/handling, submission of specimen other than nasopharyngeal swab, presence of viral mutation(s) within the areas targeted by this assay, and inadequate number of viral copies(<138 copies/mL). A negative result must be combined with clinical observations, patient history, and epidemiological information. The expected result is Negative.  Fact Sheet for Patients:  EntrepreneurPulse.com.au  Fact Sheet for Healthcare Providers:  IncredibleEmployment.be  This test is no t yet approved or cleared by the [place] FDA and  has been authorized for detection and/or diagnosis of SARS-CoV-2 by FDA under an Emergency Use Authorization (EUA). This EUA will remain  in effect (meaning this test can be used) for the duration  of the COVID-19 declaration under Section 564(b)(1) of the Act, 21 U.S.C.section 360bbb-3(b)(1), unless the authorization is terminated  or revoked sooner.       Influenza A by PCR NEGATIVE NEGATIVE Final   Influenza B by PCR NEGATIVE NEGATIVE Final    Comment: (NOTE) The Xpert Xpress SARS-CoV-2/FLU/RSV plus assay is intended as an aid in the diagnosis of influenza from Nasopharyngeal swab specimens and should not be used as a sole basis for treatment. Nasal washings and aspirates are unacceptable for  Xpert Xpress SARS-CoV-2/FLU/RSV testing.  Fact Sheet for Patients: EntrepreneurPulse.com.au  Fact Sheet for Healthcare Providers: IncredibleEmployment.be  This test is not yet approved or cleared by the [place] FDA and has been authorized for detection and/or diagnosis of SARS-CoV-2 by FDA under an Emergency Use Authorization (EUA). This EUA will remain in effect (meaning this test can be used) for the duration of the COVID-19 declaration under Section 564(b)(1) of the Act, 21 U.S.C. section 360bbb-3(b)(1), unless the authorization is terminated or revoked.  Performed at [facility], [address] [place], [place] [address]   Urine Culture     Status: Abnormal   Collection Time: 02/12/21  1:30 PM   Specimen: Urine, Random  Result Value Ref Range Status   Specimen Description   Final    URINE, RANDOM Performed at [facility], [address] [place], [place] [address]    Special Requests   Final    NONE Performed at [facility], [address] [place], [place] [address]    Culture (A)  Final    <10,000 COLONIES/mL INSIGNIFICANT GROWTH Performed at [facility], [place] [address], [place], [place] [address]    Report Status 02/13/2021 FINAL  Final   Studies/Results: CT Abdomen Pelvis Wo Contrast  Result Date: 02/12/2021 CLINICAL DATA:  Generalized weakness and pain. History of multiple myeloma. Recent fall. EXAM: CT ABDOMEN AND PELVIS WITHOUT CONTRAST TECHNIQUE: Multidetector CT imaging of the abdomen and pelvis was performed following the standard protocol without IV contrast. COMPARISON:  CT abdomen and pelvis 06/18/2020 FINDINGS: Lower chest: Stable cardiomegaly. Hypodense blood pool, suggestive of edema anemia. Partially visualized pacemaker lead in the right ventricle. Coronary artery stent. No abnormal pericardial fluid. Scattered atherosclerosis of the descending thoracic aorta. Visualized  lung bases are clear. Hepatobiliary: Mild surface nodularity. No focal liver abnormality is seen. Status post cholecystectomy. No biliary dilatation. Pancreas: Diffuse mild parenchymal atrophy and fatty infiltration. The main pancreatic duct is not dilated. No peripancreatic fluid collections. Spleen: Normal in size. A few calcifications are noted in the lateral aspect of the spleen, similar to prior studies. Adrenals/Urinary Tract: Stable low-attenuation (Hounsfield units -17 and -19) bilateral adrenal masses consistent with adrenal adenomas, unchanged compared to 03/02/2019. Stable cortical scarring at the posterior upper poles of the kidneys. No hydronephrosis. Bilateral nonobstructive nephrolithiasis also unchanged compared to 03/02/2019. Stomach/Bowel: Stomach is within normal limits. The appendix is not directly visualized, however no pericecal inflammatory changes. Diffuse diverticulosis involving the descending and sigmoid colon. No evidence of bowel wall thickening, distention, or inflammatory changes. Vascular/Lymphatic: Severe calcific atherosclerosis involving the abdominal aorta and common iliac arteries. Severe focal narrowing in the infrarenal abdominal aorta as well as the proximal left common iliac artery appear similar to 03/01/2021 the evaluation is limited due to lack of intravenous contrast. Scattered calcific atherosclerosis of the external iliac arteries. No lymphadenopathy in the abdomen or pelvis. Reproductive: Uterus and bilateral adnexa are unremarkable. Other: No abdominal wall hernia or abnormality. No  abdominopelvic ascites. Musculoskeletal: Right hip prosthesis is intact. Multilevel degenerative disc disease in the visualized distal thoracic and lumbar spine, most severe at L4-L5 with vacuum disc phenomenon. No new acute or significant osseous finding. IMPRESSION: 1. No acute abnormality in the abdomen or pelvis. 2. Mild cirrhotic changes of the liver, likely secondary to CHF. 3.  Diverticulosis of the descending and sigmoid colon without findings of diverticulitis. 4. Stable bilateral adrenal adenomas. 5. Aortic Atherosclerosis (ICD10-I70.0), severe. Electronically Signed   By: Ileana Roup M.D.   On: 02/12/2021 09:50   CT Head Wo Contrast  Result Date: 02/12/2021 CLINICAL DATA:  Generalized weakness and pain. Patient receives injections for multiple myeloma. There are multiple skin tears and bruises to both arms. Patient fell Saturday morning due to weakness. EXAM: CT HEAD WITHOUT CONTRAST CT CERVICAL SPINE WITHOUT CONTRAST TECHNIQUE: Multidetector CT imaging of the head and cervical spine was performed following the standard protocol without intravenous contrast. Multiplanar CT image reconstructions of the cervical spine were also generated. COMPARISON:  Prior exams, most recent head CT, 12/15/2020. FINDINGS: CT HEAD FINDINGS Brain: No evidence of acute infarction, hemorrhage, hydrocephalus, extra-axial collection or mass lesion/mass effect. Encephalomalacia of the right frontal lobe consistent with an old infarct. Patchy bilateral white matter hypoattenuation noted consistent with moderate chronic microvascular ischemic change. Ventricular and sulcal enlargement also noted consistent with mild diffuse atrophy. These findings are stable. Vascular: No hyperdense vessel or unexpected calcification. Skull: Normal. Negative for fracture or focal lesion. Sinuses/Orbits: Visualized globes and orbits are unremarkable. Visualized sinuses are clear. Other: None. CT CERVICAL SPINE FINDINGS Alignment: Slight reversal of the normal cervical lordosis. No spondylolisthesis. Skull base and vertebrae: No acute fracture. No primary bone lesion or focal pathologic process. Soft tissues and spinal canal: No prevertebral fluid or swelling. No visible canal hematoma. Disc levels: Mild loss of disc height at C3-C4. Remaining disc spaces are relatively well preserved. Facet degenerative change noted, most  evident on the right at C2-C3 and C3-C4. No significant disc bulging or convincing disc herniation. Upper chest: No acute or significant abnormality. Other: None. IMPRESSION: HEAD CT 1. No acute intracranial abnormalities. 2. Old right frontal lobe infarct. Mild atrophy and moderate chronic microvascular ischemic change. CERVICAL CT 1. No fracture or acute finding. Electronically Signed   By: Lajean Manes M.D.   On: 02/12/2021 09:32   CT Cervical Spine Wo Contrast  Result Date: 02/12/2021 CLINICAL DATA:  Generalized weakness and pain. Patient receives injections for multiple myeloma. There are multiple skin tears and bruises to both arms. Patient fell Saturday morning due to weakness. EXAM: CT HEAD WITHOUT CONTRAST CT CERVICAL SPINE WITHOUT CONTRAST TECHNIQUE: Multidetector CT imaging of the head and cervical spine was performed following the standard protocol without intravenous contrast. Multiplanar CT image reconstructions of the cervical spine were also generated. COMPARISON:  Prior exams, most recent head CT, 12/15/2020. FINDINGS: CT HEAD FINDINGS Brain: No evidence of acute infarction, hemorrhage, hydrocephalus, extra-axial collection or mass lesion/mass effect. Encephalomalacia of the right frontal lobe consistent with an old infarct. Patchy bilateral white matter hypoattenuation noted consistent with moderate chronic microvascular ischemic change. Ventricular and sulcal enlargement also noted consistent with mild diffuse atrophy. These findings are stable. Vascular: No hyperdense vessel or unexpected calcification. Skull: Normal. Negative for fracture or focal lesion. Sinuses/Orbits: Visualized globes and orbits are unremarkable. Visualized sinuses are clear. Other: None. CT CERVICAL SPINE FINDINGS Alignment: Slight reversal of the normal cervical lordosis. No spondylolisthesis. Skull base and vertebrae: No acute fracture. No primary bone  lesion or focal pathologic process. Soft tissues and spinal  canal: No prevertebral fluid or swelling. No visible canal hematoma. Disc levels: Mild loss of disc height at C3-C4. Remaining disc spaces are relatively well preserved. Facet degenerative change noted, most evident on the right at C2-C3 and C3-C4. No significant disc bulging or convincing disc herniation. Upper chest: No acute or significant abnormality. Other: None. IMPRESSION: HEAD CT 1. No acute intracranial abnormalities. 2. Old right frontal lobe infarct. Mild atrophy and moderate chronic microvascular ischemic change. CERVICAL CT 1. No fracture or acute finding. Electronically Signed   By: Lajean Manes M.D.   On: 02/12/2021 09:32   DG Chest Port 1 View  Result Date: 02/12/2021 CLINICAL DATA:  Questionable sepsis.  Weakness. EXAM: PORTABLE CHEST 1 VIEW COMPARISON:  06/25/2020 FINDINGS: Dual-chamber ICD leads from the left. Cardiomegaly. Coronary stenting. There is no edema, consolidation, effusion, or pneumothorax. IMPRESSION: No evidence of active disease.  Chronic cardiomegaly. Electronically Signed   By: Jorje Guild M.D.   On: 02/12/2021 06:55   Medications: I have reviewed the patient's current medications. Scheduled Meds:  acidophilus  1 capsule Oral Daily   allopurinol  200 mg Oral Daily   aspirin EC  81 mg Oral QHS   docusate sodium  100 mg Oral BID   FLUoxetine  10 mg Oral Daily   gabapentin  100 mg Oral Daily   And   gabapentin  700 mg Oral QHS   levothyroxine  50 mcg Oral Q0600   loratadine  10 mg Oral Daily   magnesium oxide  200 mg Oral Daily   mexiletine  200 mg Oral Q12H   midodrine  10 mg Oral BID WC   pantoprazole (PROTONIX) IV  40 mg Intravenous Q24H   polyethylene glycol  17 g Oral Daily   Ensure Max Protein  11 oz Oral BID BM   simvastatin  20 mg Oral QHS   traZODone  50 mg Oral QHS   Continuous Infusions:  sodium chloride Stopped (02/12/21 1027)   [START ON 02/14/2021] vancomycin     PRN Meds:.zolpidem   Assessment: Principal Problem:   Symptomatic  anemia Active Problems:   CKD stage 4 due to type 2 diabetes mellitus (HCC)   Chronic systolic CHF (congestive heart failure) ([place])   ICD (implantable cardioverter-defibrillator) in place   Anemia in chronic kidney disease (CKD)   Multiple myeloma (HCC)   Hyperkalemia   Chronic respiratory failure (HCC)   Leukocytosis   Lactic acid acidosis  Heather Peterson 80 y.o. 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 No overt blood loss noted Likely anemia could be from myeloma +/- bleeding from AVM's   Plan: Continue PPI, monitor CBC and transfuse.  Will plan for EGD when Aki resolved. She is not actively bleeding . Discussed plan with Dr Billie Ruddy   LOS: 1 day   [name], MD 02/13/2021, 1:21 PM

## 2021-02-13 NOTE — Consult Note (Signed)
Pharmacy Antibiotic Note  Heather Peterson is a 80 y.o. female admitted on 02/12/2021 with sepsis.  Pharmacy has been consulted for Vancomycin dosing. Patient with AKI on admittion with history of CKD, stage 4 not currently receiving dialysis. Scr 2.00 (baseline around 1.55) - pt Scr back to baseline with Scr 1.39 today (10/4). Pt received 1500 mg vancomycin loading dose 10/3.  Plan: Vancomycin 1250 mg IV q48 hours starting 10/5 Est AUC: 460 Scr used 1.39; Vd 0.72, IBW Obtain vanc levels at steady state if continued Monitor renal function and adjust dose as clinically indicated Monitor cultures and pt clinical status/improvement     Temp (24hrs), Avg:97.6 F (36.4 C), Min:97.5 F (36.4 C), Max:97.7 F (36.5 C)  Recent Labs  Lab 02/12/21 0617 02/12/21 0648 02/12/21 0950 02/12/21 1606 02/12/21 2021 02/13/21 0655  WBC  --  23.7*  --   --   --  20.0*  CREATININE 2.00*  --   --   --  1.48* 1.39*  LATICACIDVEN 2.9*  --  2.6* 2.8* 2.1*  --      Estimated Creatinine Clearance: 30.8 mL/min (A) (by C-G formula based on SCr of 1.39 mg/dL (H)).    Allergies  Allergen Reactions   Celebrex [Celecoxib] Anaphylaxis   Glipizide Anaphylaxis   Levaquin [Levofloxacin In D5w] Other (See Comments)    Heart arrhthymias   Levofloxacin Other (See Comments) and Palpitations    ICD fired   Lisinopril Swelling    Lip and facial swelling   Sulfa Antibiotics Other (See Comments) and Anaphylaxis    Reaction: unknown   Metformin Other (See Comments)    Gi tolerance    Penicillins Rash and Other (See Comments)    Has patient had a PCN reaction causing immediate rash, facial/tongue/throat swelling, SOB or lightheadedness with hypotension: Unknown Has patient had a PCN reaction causing severe rash involving mucus membranes or skin necrosis: No Has patient had a PCN reaction that required hospitalization: No Has patient had a PCN reaction occurring within the last 10 years: No If all of the above  answers are "NO", then may proceed with Cephalosporin use.     Antimicrobials this admission: Vancomycin 10/3 >>  Cefepime/Flagyl 10/3 x 1  Microbiology results: 10/3 BCx: NG <24 h 10/3 UCx: sent   Thank you for allowing pharmacy to be a part of this patient's care.  Forde Dandy Heather Peterson 02/13/2021 10:54 AM

## 2021-02-14 DIAGNOSIS — D649 Anemia, unspecified: Secondary | ICD-10-CM | POA: Diagnosis not present

## 2021-02-14 LAB — BASIC METABOLIC PANEL
Anion gap: 7 (ref 5–15)
BUN: 65 mg/dL — ABNORMAL HIGH (ref 8–23)
CO2: 23 mmol/L (ref 22–32)
Calcium: 8.7 mg/dL — ABNORMAL LOW (ref 8.9–10.3)
Chloride: 109 mmol/L (ref 98–111)
Creatinine, Ser: 1.06 mg/dL — ABNORMAL HIGH (ref 0.44–1.00)
GFR, Estimated: 53 mL/min — ABNORMAL LOW (ref >60)
Glucose, Bld: 183 mg/dL — ABNORMAL HIGH (ref 70–99)
Potassium: 4.3 mmol/L (ref 3.5–5.1)
Sodium: 139 mmol/L (ref 135–145)

## 2021-02-14 LAB — GLUCOSE, CAPILLARY
Glucose-Capillary: 184 mg/dL — ABNORMAL HIGH (ref 70–99)
Glucose-Capillary: 186 mg/dL — ABNORMAL HIGH (ref 70–99)
Glucose-Capillary: 176 mg/dL — ABNORMAL HIGH (ref 70–99)
Glucose-Capillary: 151 mg/dL — ABNORMAL HIGH (ref 70–99)

## 2021-02-14 LAB — CBC
HCT: 21.9 % — ABNORMAL LOW (ref 36.0–46.0)
Hemoglobin: 6.7 g/dL — ABNORMAL LOW (ref 12.0–15.0)
MCH: 28.6 pg (ref 26.0–34.0)
MCHC: 30.6 g/dL (ref 30.0–36.0)
MCV: 93.6 fL (ref 80.0–100.0)
Platelets: 70 10*3/uL — ABNORMAL LOW (ref 150–400)
RBC: 2.34 MIL/uL — ABNORMAL LOW (ref 3.87–5.11)
RDW: 17.7 % — ABNORMAL HIGH (ref 11.5–15.5)
WBC: 18.1 10*3/uL — ABNORMAL HIGH (ref 4.0–10.5)
nRBC: 2.5 % — ABNORMAL HIGH (ref 0.0–0.2)

## 2021-02-14 LAB — HEMOGLOBIN A1C
Hgb A1c MFr Bld: 7.1 % — ABNORMAL HIGH (ref 4.8–5.6)
Mean Plasma Glucose: 157 mg/dL

## 2021-02-14 LAB — MAGNESIUM: Magnesium: 2.5 mg/dL — ABNORMAL HIGH (ref 1.7–2.4)

## 2021-02-14 LAB — PREPARE RBC (CROSSMATCH)

## 2021-02-14 MED ORDER — SODIUM CHLORIDE 0.9% IV SOLUTION: Freq: Once | INTRAVENOUS | Status: DC

## 2021-02-14 MED ORDER — LEVOTHYROXINE SODIUM 50 MCG PO TABS
75.0000 ug | ORAL_TABLET | ORAL | Status: DC
  Administered 2021-02-16 – 2021-02-19 (×2): 75 ug via ORAL
  Filled 2021-02-14 (×2): qty 2

## 2021-02-14 MED ORDER — INSULIN ASPART 100 UNIT/ML IJ SOLN
0.0000 [IU] | Freq: Three times a day (TID) | INTRAMUSCULAR | Status: DC
  Administered 2021-02-14 (×3): 3 [IU] via SUBCUTANEOUS
  Administered 2021-02-15: 5 [IU] via SUBCUTANEOUS
  Administered 2021-02-16 – 2021-02-17 (×4): 2 [IU] via SUBCUTANEOUS
  Administered 2021-02-17 – 2021-02-18 (×3): 3 [IU] via SUBCUTANEOUS
  Administered 2021-02-18: 2 [IU] via SUBCUTANEOUS
  Administered 2021-02-19 (×2): 3 [IU] via SUBCUTANEOUS
  Filled 2021-02-14 (×13): qty 1

## 2021-02-14 MED ORDER — PANTOPRAZOLE SODIUM 40 MG IV SOLR
40.0000 mg | Freq: Two times a day (BID) | INTRAVENOUS | Status: DC
  Administered 2021-02-14 – 2021-02-20 (×12): 40 mg via INTRAVENOUS
  Filled 2021-02-14 (×12): qty 40

## 2021-02-14 NOTE — Progress Notes (Signed)
PT Cancellation Note  Patient Details Name: Heather Peterson MRN: [id number] DOB: 1940/11/19   Cancelled Treatment:    Reason Eval/Treat Not Completed: Medical issues which prohibited therapy (Consult received and chart reviewed.  Per primary RN, recommends hold at this time-patient patient pending EGD and blood transfusion this date.  Will continue to follow and initiate as medically appropriate.)  Daana Petrasek H. Owens Shark, PT, DPT, NCS 02/14/21, 8:49 AM [phone]

## 2021-02-14 NOTE — Progress Notes (Signed)
PROGRESS NOTE    Heather Peterson  HQP:[id number] DOB: Aug 08, 1940 DOA: 02/12/2021 PCP: [name], MD    Brief Narrative:  80 y.o. 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  Patient states that she fell on the morning of her admission but denies hitting her head or any loss of consciousness.   Assessment & Plan:   Principal Problem:   Symptomatic anemia Active Problems:   CKD stage 4 due to type 2 diabetes mellitus (HCC)   Chronic systolic CHF (congestive heart failure) (HCC)   ICD (implantable cardioverter-defibrillator) in place   Anemia in chronic kidney disease (CKD)   Multiple myeloma (HCC)   Hyperkalemia   Chronic respiratory failure (HCC)   Leukocytosis   Lactic acid acidosis  acute on chronic anemia  Hx of AVM treated in 2021 --Hgb appeared to be gradually trending down, 6.0 on presentation. --likely due to upper GI bleed, since BUN 109 markedly elevated.   --s/p 1u pRBC with appropriate rise to 7.1 -Hemoglobin 6.7 on 10/5 Plan: Transfuse 1 unit PRBC Discussed with GI, plan for endoscopy 10/6 Okay for diet today, n.p.o. after midnight  Diabetes mellitus with complications of stage IV chronic kidney disease --No hypoglycemics listed on home med --BG's have been elevated  Plan: --SSI TID for now   AKI on CKD4 --Cr 2.0 on presentation.  Improved to 1.39 with IVF. --torsemide and spironolactone held on presentation. Plan: No further IVF Hold Demadex and Aldactone 1 day If creatinine normalizes can restart tomorrow   Hyperkalemia, resolved Probably medication induced. Without EKG  changes --s/p dextrose and insulin --hold spironolactone   History of chronic systolic heart failure Last known LVEF of 25% from February, 2022 --not fluid overloaded --continue to hold torsemide and spironolactone due to AKI and hypotension   Hypotension --pt has midodrine and torsemide and spironolactone on home med list Plan: --cont midodrine --continue to hold torsemide and spironolactone    History of multiple myeloma On chemotherapy Follow-up with oncology as an outpatient   Sepsis, ruled out --Leukocytosis likely reactive.  No fever.  Procal 0.17.  No source of infection currently. --Patient started on broad-spectrum antibiotic therapy in the ER with vancomycin, cefepime and Flagyl --d/c abx and monitor   DVT prophylaxis: SCD Code Status: Full Family Communication: Linde Gillis [phone] on 10/5  Disposition Plan: Status is: Inpatient  Remains inpatient appropriate because:Inpatient level of care appropriate due to severity of illness  Dispo: The patient is from: Home              Anticipated d/c is to: Home              Patient currently is not medically stable to d/c.   Difficult to place patient No       Level of care: Med-Surg  Consultants:  GI  Procedures:  EGD planned 10/6  Antimicrobials: None   Subjective: Patient seen and examined.  No acute status changes overnight.  No new complaints  Objective: Vitals:   02/13/21 1505 02/13/21 1534 02/14/21 0502 02/14/21 0805  BP: 110/64  (!) 105/59 (!) 103/56  Pulse: 85  79 83  Resp: 18  18 18   Temp:  97.8 F (36.6 C)  98 F (36.7 C) 98.4 F (36.9 C)  TempSrc: Oral  Oral Oral  SpO2: 100%  100% 100%  Weight:  74.1 kg    Height:  5' 3"  (1.6 m)      Intake/Output Summary (Last 24 hours) at 02/14/2021 1357 Last data filed at 02/14/2021 0700 Gross per 24 hour  Intake --  Output 800 ml  Net -800 ml   Filed Weights   02/13/21 1534  Weight: 74.1 kg    Examination:  General exam: No  acute distress.  Appears fatigued Respiratory system: Lungs clear.  Normal work of breathing.  2 L Cardiovascular system: S1-S2, RRR, no murmurs, trace pedal edema Gastrointestinal system: Obese, NT/ND, normal bowel sounds Central nervous system: Alert and oriented. No focal neurological deficits. Extremities: Symmetric 5 x 5 power. Skin: No rashes, lesions or ulcers Psychiatry: Judgement and insight appear normal. Mood & affect appropriate.     Data Reviewed: I have personally reviewed following labs and imaging studies  CBC: Recent Labs  Lab 02/12/21 0648 02/13/21 0655 02/14/21 0352  WBC 23.7* 20.0* 18.1*  NEUTROABS 18.9*  --   --   HGB 6.0* 7.1* 6.7*  HCT 19.5* 21.4* 21.9*  MCV 84.1 85.3 93.6  PLT 111* 82* 70*   Basic Metabolic Panel: Recent Labs  Lab 02/12/21 0617 02/12/21 2021 02/13/21 0655 02/14/21 0352  NA 132* 136 138 139  K 5.5* 5.1 5.1 4.3  CL 94* 106 106 109  CO2 27 21* 23 23  GLUCOSE 353* 204* 207* 183*  BUN 135* 109* 86* 65*  CREATININE 2.00* 1.48* 1.39* 1.06*  CALCIUM 9.2 8.7* 8.9 8.7*  MG  --   --   --  2.5*   GFR: Estimated Creatinine Clearance: 41.5 mL/min (A) (by C-G formula based on SCr of 1.06 mg/dL (H)). Liver Function Tests: Recent Labs  Lab 02/12/21 0617  AST 23  ALT 19  ALKPHOS 68  BILITOT 0.8  PROT 6.6  ALBUMIN 3.8   No results for input(s): LIPASE, AMYLASE in the last 168 hours. No results for input(s): AMMONIA in the last 168 hours. Coagulation Profile: Recent Labs  Lab 02/12/21 0803  INR 1.2   Cardiac Enzymes: No results for input(s): CKTOTAL, CKMB, CKMBINDEX, TROPONINI in the last 168 hours. BNP (last 3 results) No results for input(s): PROBNP in the last 8760 hours. HbA1C: No results for input(s): HGBA1C in the last 72 hours. CBG: Recent Labs  Lab 02/14/21 0806 02/14/21 1140  GLUCAP 184* 186*   Lipid Profile: No results for input(s): CHOL, HDL, LDLCALC, TRIG, CHOLHDL, LDLDIRECT in the last 72 hours. Thyroid  Function Tests: No results for input(s): TSH, T4TOTAL, FREET4, T3FREE, THYROIDAB in the last 72 hours. Anemia Panel: Recent Labs    02/12/21 0617  TIBC 570*  IRON 43   Sepsis Labs: Recent Labs  Lab 02/12/21 0617 02/12/21 0950 02/12/21 1606 02/12/21 2021 02/13/21 0725  PROCALCITON  --   --   --   --  0.17  LATICACIDVEN 2.9* 2.6* 2.8* 2.1*  --     Recent Results (from the past 240 hour(s))  Blood Culture (routine x 2)     Status: None (Preliminary result)   Collection Time: 02/12/21  6:48 AM   Specimen: BLOOD  Result Value Ref Range Status   Specimen Description BLOOD LEFT ARM  Final   Special Requests   Final    BOTTLES DRAWN AEROBIC AND ANAEROBIC Blood Culture adequate volume   Culture  Final    NO GROWTH 2 DAYS Performed at [facility], [address] [place], [place] [address]    Report Status PENDING  Incomplete  Blood Culture (routine x 2)     Status: None (Preliminary result)   Collection Time: 02/12/21  6:48 AM   Specimen: BLOOD LEFT HAND  Result Value Ref Range Status   Specimen Description BLOOD LEFT HAND  Final   Special Requests   Final    BOTTLES DRAWN AEROBIC ONLY Blood Culture adequate volume   Culture   Final    NO GROWTH 2 DAYS Performed at [facility], [address], [place], [place] [address]    Report Status PENDING  Incomplete  Resp Panel by RT-PCR (Flu A&B, Covid) Nasopharyngeal Swab     Status: None   Collection Time: 02/12/21  7:20 AM   Specimen: Nasopharyngeal Swab; Nasopharyngeal(NP) swabs in vial transport medium  Result Value Ref Range Status   SARS Coronavirus 2 by RT PCR NEGATIVE NEGATIVE Final    Comment: (NOTE) SARS-CoV-2 target nucleic acids are NOT DETECTED.  The SARS-CoV-2 RNA is generally detectable in upper respiratory specimens during the acute phase of infection. The lowest concentration of SARS-CoV-2 viral copies this assay can detect is 138 copies/mL. A negative result does not preclude  SARS-Cov-2 infection and should not be used as the sole basis for treatment or other patient management decisions. A negative result may occur with  improper specimen collection/handling, submission of specimen other than nasopharyngeal swab, presence of viral mutation(s) within the areas targeted by this assay, and inadequate number of viral copies(<138 copies/mL). A negative result must be combined with clinical observations, patient history, and epidemiological information. The expected result is Negative.  Fact Sheet for Patients:  EntrepreneurPulse.com.au  Fact Sheet for Healthcare Providers:  IncredibleEmployment.be  This test is no t yet approved or cleared by the [place] FDA and  has been authorized for detection and/or diagnosis of SARS-CoV-2 by FDA under an Emergency Use Authorization (EUA). This EUA will remain  in effect (meaning this test can be used) for the duration of the COVID-19 declaration under Section 564(b)(1) of the Act, 21 U.S.C.section 360bbb-3(b)(1), unless the authorization is terminated  or revoked sooner.       Influenza A by PCR NEGATIVE NEGATIVE Final   Influenza B by PCR NEGATIVE NEGATIVE Final    Comment: (NOTE) The Xpert Xpress SARS-CoV-2/FLU/RSV plus assay is intended as an aid in the diagnosis of influenza from Nasopharyngeal swab specimens and should not be used as a sole basis for treatment. Nasal washings and aspirates are unacceptable for Xpert Xpress SARS-CoV-2/FLU/RSV testing.  Fact Sheet for Patients: EntrepreneurPulse.com.au  Fact Sheet for Healthcare Providers: IncredibleEmployment.be  This test is not yet approved or cleared by the [place] FDA and has been authorized for detection and/or diagnosis of SARS-CoV-2 by FDA under an Emergency Use Authorization (EUA). This EUA will remain in effect (meaning this test can be used) for the duration of  the COVID-19 declaration under Section 564(b)(1) of the Act, 21 U.S.C. section 360bbb-3(b)(1), unless the authorization is terminated or revoked.  Performed at [facility], [address], [place], [place] [address]   Urine Culture     Status: Abnormal   Collection Time: 02/12/21  1:30 PM   Specimen: Urine, Random  Result Value Ref Range Status   Specimen Description   Final    URINE, RANDOM Performed at [facility], [address] [place], [place] [address]  Special Requests   Final    NONE Performed at [facility], [address] [place], [place] [address]    Culture (A)  Final    <10,000 COLONIES/mL INSIGNIFICANT GROWTH Performed at [facility] [address] [place], [place] [address]    Report Status 02/13/2021 FINAL  Final         Radiology Studies: No results found.      Scheduled Meds:  sodium chloride   Intravenous Once   acidophilus  1 capsule Oral Daily   allopurinol  200 mg Oral Daily   aspirin EC  81 mg Oral QHS   docusate sodium  100 mg Oral BID   FLUoxetine  10 mg Oral Daily   gabapentin  100 mg Oral Daily   And   gabapentin  700 mg Oral QHS   insulin aspart  0-15 Units Subcutaneous TID WC   levothyroxine  50 mcg Oral Q0600   loratadine  10 mg Oral Daily   magnesium oxide  200 mg Oral Daily   mexiletine  200 mg Oral Q12H   midodrine  10 mg Oral BID WC   pantoprazole (PROTONIX) IV  40 mg Intravenous Q12H   polyethylene glycol  17 g Oral Daily   Ensure Max Protein  11 oz Oral BID BM   simvastatin  20 mg Oral QHS   traZODone  50 mg Oral QHS   Continuous Infusions:   LOS: 2 days    Time spent: 25 minutes    Sidney Ace, MD [facility]   If 7PM-7AM, please contact night-coverage  02/14/2021, 1:57 PM

## 2021-02-14 NOTE — Progress Notes (Signed)
[name] , MD [address] [place], [place], [place], [address] [address], [place], [place], [place], [address] Phone: [phone]  Fax: [phone]   Heather Peterson is being followed for anemia day 2 of follow up   Subjective: No new complaints, having a transfusion    Objective: Vital signs in last 24 hours: Vitals:   02/13/21 1505 02/13/21 1534 02/14/21 0502 02/14/21 0805  BP: 110/64  (!) 105/59 (!) 103/56  Pulse: 85  79 83  Resp: 18  18 18   Temp: 97.8 F (36.6 C)  98 F (36.7 C) 98.4 F (36.9 C)  TempSrc: Oral  Oral Oral  SpO2: 100%  100% 100%  Weight:  74.1 kg    Height:  5' 3"  (1.6 m)     Weight change:   Intake/Output Summary (Last 24 hours) at 02/14/2021 1017 Last data filed at 02/14/2021 0700 Gross per 24 hour  Intake --  Output 1800 ml  Net -1800 ml     Exam:  Abdomen: soft, nontender, normal bowel sounds   Lab Results: @LABTEST2 @ Micro Results: Recent Results (from the past 240 hour(s))  Blood Culture (routine x 2)     Status: None (Preliminary result)   Collection Time: 02/12/21  6:48 AM   Specimen: BLOOD  Result Value Ref Range Status   Specimen Description BLOOD LEFT ARM  Final   Special Requests   Final    BOTTLES DRAWN AEROBIC AND ANAEROBIC Blood Culture adequate volume   Culture   Final    NO GROWTH 2 DAYS Performed at [facility], [address] [place], [place] [address]    Report Status PENDING  Incomplete  Blood Culture (routine x 2)     Status: None (Preliminary result)   Collection Time: 02/12/21  6:48 AM   Specimen: BLOOD LEFT HAND  Result Value Ref Range Status   Specimen Description BLOOD LEFT HAND  Final   Special Requests   Final    BOTTLES DRAWN AEROBIC ONLY Blood Culture adequate volume   Culture   Final    NO GROWTH 2 DAYS Performed at [facility], [address], [place], [place] [address]    Report Status PENDING  Incomplete  Resp Panel by RT-PCR (Flu A&B, Covid)  Nasopharyngeal Swab     Status: None   Collection Time: 02/12/21  7:20 AM   Specimen: Nasopharyngeal Swab; Nasopharyngeal(NP) swabs in vial transport medium  Result Value Ref Range Status   SARS Coronavirus 2 by RT PCR NEGATIVE NEGATIVE Final    Comment: (NOTE) SARS-CoV-2 target nucleic acids are NOT DETECTED.  The SARS-CoV-2 RNA is generally detectable in upper respiratory specimens during the acute phase of infection. The lowest concentration of SARS-CoV-2 viral copies this assay can detect is 138 copies/mL. A negative result does not preclude SARS-Cov-2 infection and should not be used as the sole basis for treatment or other patient management decisions. A negative result may occur with  improper specimen collection/handling, submission of specimen other than nasopharyngeal swab, presence of viral mutation(s) within the areas targeted by this assay, and inadequate number of viral copies(<138 copies/mL). A negative result must be combined with clinical observations, patient history, and epidemiological information. The expected result is Negative.  Fact Sheet for Patients:  EntrepreneurPulse.com.au  Fact Sheet for Healthcare Providers:  IncredibleEmployment.be  This test is no t yet approved or cleared by the [place] FDA and  has been authorized for detection and/or diagnosis of SARS-CoV-2 by FDA  under an Emergency Use Authorization (EUA). This EUA will remain  in effect (meaning this test can be used) for the duration of the COVID-19 declaration under Section 564(b)(1) of the Act, 21 U.S.C.section 360bbb-3(b)(1), unless the authorization is terminated  or revoked sooner.       Influenza A by PCR NEGATIVE NEGATIVE Final   Influenza B by PCR NEGATIVE NEGATIVE Final    Comment: (NOTE) The Xpert Xpress SARS-CoV-2/FLU/RSV plus assay is intended as an aid in the diagnosis of influenza from Nasopharyngeal swab specimens and should not be  used as a sole basis for treatment. Nasal washings and aspirates are unacceptable for Xpert Xpress SARS-CoV-2/FLU/RSV testing.  Fact Sheet for Patients: EntrepreneurPulse.com.au  Fact Sheet for Healthcare Providers: IncredibleEmployment.be  This test is not yet approved or cleared by the [place] FDA and has been authorized for detection and/or diagnosis of SARS-CoV-2 by FDA under an Emergency Use Authorization (EUA). This EUA will remain in effect (meaning this test can be used) for the duration of the COVID-19 declaration under Section 564(b)(1) of the Act, 21 U.S.C. section 360bbb-3(b)(1), unless the authorization is terminated or revoked.  Performed at [facility], [address], [place], [place] [address]   Urine Culture     Status: Abnormal   Collection Time: 02/12/21  1:30 PM   Specimen: Urine, Random  Result Value Ref Range Status   Specimen Description   Final    URINE, RANDOM Performed at [facility], [address] [place], [place] [address]    Special Requests   Final    NONE Performed at [facility], [place] [place], [place] [address]    Culture (A)  Final    <10,000 COLONIES/mL INSIGNIFICANT GROWTH Performed at [facility], [place] [address] [place], [place] [address]    Report Status 02/13/2021 FINAL  Final   Studies/Results: No results found. Medications: I have reviewed the patient's current medications. Scheduled Meds:  sodium chloride   Intravenous Once   acidophilus  1 capsule Oral Daily   allopurinol  200 mg Oral Daily   aspirin EC  81 mg Oral QHS   docusate sodium  100 mg Oral BID   FLUoxetine  10 mg Oral Daily   gabapentin  100 mg Oral Daily   And   gabapentin  700 mg Oral QHS   insulin aspart  0-15 Units Subcutaneous TID WC   levothyroxine  50 mcg Oral Q0600   loratadine  10 mg Oral Daily   magnesium oxide  200 mg Oral Daily   mexiletine  200 mg Oral  Q12H   midodrine  10 mg Oral BID WC   pantoprazole (PROTONIX) IV  40 mg Intravenous Q12H   polyethylene glycol  17 g Oral Daily   Ensure Max Protein  11 oz Oral BID BM   simvastatin  20 mg Oral QHS   traZODone  50 mg Oral QHS   Continuous Infusions: PRN Meds:.zolpidem  CBC Latest Ref Rng & Units 02/14/2021 02/13/2021 02/12/2021  WBC 4.0 - 10.5 K/uL 18.1(H) 20.0(H) 23.7(H)  Hemoglobin 12.0 - 15.0 g/dL 6.7(L) 7.1(L) 6.0(L)  Hematocrit 36.0 - 46.0 % 21.9(L) 21.4(L) 19.5(L)  Platelets 150 - 400 K/uL 70(L) 82(L) 111(L)    Assessment: Principal Problem:   Symptomatic anemia Active Problems:   CKD stage 4 due to type 2 diabetes mellitus (HCC)   Chronic systolic CHF (congestive heart failure) ([place])   ICD (implantable cardioverter-defibrillator) in place   Anemia in  chronic kidney disease (CKD)   Multiple myeloma (HCC)   Hyperkalemia   Chronic respiratory failure (HCC)   Leukocytosis   Lactic acid acidosis  Heather Peterson 80 y.o. 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 No overt blood loss noted Likely anemia could be from myeloma +/- bleeding from AVM's   Last colonoscopy performed in April 2021 by Dr. Alice Reichert showed diverticulosis of the colon and 2 subcentimeter polyps resected.   Plan: Continue PPI, monitor CBC and transfuse.  Drop in hemoglobin likely related to improvement in AKI and rehydration. Will plan for EGD Tomorrow . She is not actively bleeding . Discussed plan with Dr Priscella Mann   LOS: 2 days   [name], MD 02/14/2021, 10:17 AM

## 2021-02-15 ENCOUNTER — Inpatient Hospital Stay: Payer: Medicare Other | Admitting: Certified Registered Nurse Anesthetist

## 2021-02-15 ENCOUNTER — Encounter
Admission: EM | Disposition: A | Discharge status: Home Health | Payer: Self-pay | Source: Home / Self Care | Attending: Internal Medicine

## 2021-02-15 DIAGNOSIS — K31819 Angiodysplasia of stomach and duodenum without bleeding: Secondary | ICD-10-CM | POA: Diagnosis not present

## 2021-02-15 DIAGNOSIS — D649 Anemia, unspecified: Secondary | ICD-10-CM | POA: Diagnosis not present

## 2021-02-15 HISTORY — PX: ESOPHAGOGASTRODUODENOSCOPY (EGD) WITH PROPOFOL: SHX5813

## 2021-02-15 LAB — TYPE AND SCREEN
ABO/RH(D): B POS
Antibody Screen: NEGATIVE
Unit division: 0
Unit division: 0

## 2021-02-15 LAB — CBC WITH DIFFERENTIAL/PLATELET
Abs Immature Granulocytes: 1.26 10*3/uL — ABNORMAL HIGH (ref 0.00–0.07)
Basophils Absolute: 0.1 10*3/uL (ref 0.0–0.1)
Basophils Relative: 0 %
Eosinophils Absolute: 0.1 10*3/uL (ref 0.0–0.5)
Eosinophils Relative: 0 %
HCT: 22.8 % — ABNORMAL LOW (ref 36.0–46.0)
Hemoglobin: 7.6 g/dL — ABNORMAL LOW (ref 12.0–15.0)
Immature Granulocytes: 7 %
Lymphocytes Relative: 6 %
Lymphs Abs: 1.1 10*3/uL (ref 0.7–4.0)
MCH: 28.5 pg (ref 26.0–34.0)
MCHC: 33.3 g/dL (ref 30.0–36.0)
MCV: 85.4 fL (ref 80.0–100.0)
Monocytes Absolute: 1.5 10*3/uL — ABNORMAL HIGH (ref 0.1–1.0)
Monocytes Relative: 8 %
Neutro Abs: 14.8 10*3/uL — ABNORMAL HIGH (ref 1.7–7.7)
Neutrophils Relative %: 79 %
Platelets: 118 10*3/uL — ABNORMAL LOW (ref 150–400)
RBC: 2.67 MIL/uL — ABNORMAL LOW (ref 3.87–5.11)
RDW: 17.5 % — ABNORMAL HIGH (ref 11.5–15.5)
WBC: 18.7 10*3/uL — ABNORMAL HIGH (ref 4.0–10.5)
nRBC: 1.9 % — ABNORMAL HIGH (ref 0.0–0.2)

## 2021-02-15 LAB — BPAM RBC
Blood Product Expiration Date: 202210132359
Blood Product Expiration Date: 202210192359
ISSUE DATE / TIME: 202210031429
ISSUE DATE / TIME: 202210051437
Unit Type and Rh: 1700
Unit Type and Rh: 7300

## 2021-02-15 LAB — CBC
HCT: 22.9 % — ABNORMAL LOW (ref 36.0–46.0)
Hemoglobin: 7.5 g/dL — ABNORMAL LOW (ref 12.0–15.0)
MCH: 28.4 pg (ref 26.0–34.0)
MCHC: 32.8 g/dL (ref 30.0–36.0)
MCV: 86.7 fL (ref 80.0–100.0)
Platelets: 112 10*3/uL — ABNORMAL LOW (ref 150–400)
RBC: 2.64 MIL/uL — ABNORMAL LOW (ref 3.87–5.11)
RDW: 17.4 % — ABNORMAL HIGH (ref 11.5–15.5)
WBC: 19.1 10*3/uL — ABNORMAL HIGH (ref 4.0–10.5)
nRBC: 1.9 % — ABNORMAL HIGH (ref 0.0–0.2)

## 2021-02-15 LAB — BASIC METABOLIC PANEL
Anion gap: 5 (ref 5–15)
BUN: 55 mg/dL — ABNORMAL HIGH (ref 8–23)
CO2: 25 mmol/L (ref 22–32)
Calcium: 8.6 mg/dL — ABNORMAL LOW (ref 8.9–10.3)
Chloride: 107 mmol/L (ref 98–111)
Creatinine, Ser: 1.06 mg/dL — ABNORMAL HIGH (ref 0.44–1.00)
GFR, Estimated: 53 mL/min — ABNORMAL LOW (ref >60)
Glucose, Bld: 121 mg/dL — ABNORMAL HIGH (ref 70–99)
Potassium: 4.6 mmol/L (ref 3.5–5.1)
Sodium: 137 mmol/L (ref 135–145)

## 2021-02-15 LAB — MAGNESIUM: Magnesium: 2.5 mg/dL — ABNORMAL HIGH (ref 1.7–2.4)

## 2021-02-15 LAB — GLUCOSE, CAPILLARY
Glucose-Capillary: 127 mg/dL — ABNORMAL HIGH (ref 70–99)
Glucose-Capillary: 160 mg/dL — ABNORMAL HIGH (ref 70–99)
Glucose-Capillary: 214 mg/dL — ABNORMAL HIGH (ref 70–99)
Glucose-Capillary: 119 mg/dL — ABNORMAL HIGH (ref 70–99)

## 2021-02-15 LAB — KOH PREP

## 2021-02-15 LAB — PROCALCITONIN: Procalcitonin: <0.1 ng/mL

## 2021-02-15 SURGERY — ESOPHAGOGASTRODUODENOSCOPY (EGD) WITH PROPOFOL
Anesthesia: General

## 2021-02-15 MED ORDER — SODIUM CHLORIDE 0.9 % IV SOLN: INTRAVENOUS | Status: DC

## 2021-02-15 MED ORDER — PROPOFOL 500 MG/50ML IV EMUL
INTRAVENOUS | Status: AC
  Filled 2021-02-15: qty 50

## 2021-02-15 MED ORDER — PROPOFOL 10 MG/ML IV BOLUS
INTRAVENOUS | Status: DC | PRN
  Administered 2021-02-15: 5 mg via INTRAVENOUS
  Administered 2021-02-15: 40 mg via INTRAVENOUS

## 2021-02-15 MED ORDER — LIDOCAINE HCL (CARDIAC) PF 100 MG/5ML IV SOSY
PREFILLED_SYRINGE | INTRAVENOUS | Status: DC | PRN
  Administered 2021-02-15: 50 mg via INTRAVENOUS

## 2021-02-15 MED ORDER — NYSTATIN 100000 UNIT/ML MT SUSP
5.0000 mL | Freq: Four times a day (QID) | OROMUCOSAL | Status: DC
  Administered 2021-02-15 – 2021-02-20 (×18): 500000 [IU] via ORAL
  Filled 2021-02-15 (×18): qty 5

## 2021-02-15 MED ORDER — SODIUM CHLORIDE 0.9 % IV SOLN: INTRAVENOUS | Status: DC | PRN

## 2021-02-15 MED ORDER — PHENYLEPHRINE HCL (PRESSORS) 10 MG/ML IV SOLN
INTRAVENOUS | Status: DC | PRN
  Administered 2021-02-15: 50 ug via INTRAVENOUS
  Administered 2021-02-15 (×3): 100 ug via INTRAVENOUS

## 2021-02-15 MED ORDER — MAGIC MOUTHWASH
15.0000 mL | Freq: Three times a day (TID) | ORAL | Status: DC | PRN
  Administered 2021-02-15 – 2021-02-16 (×2): 15 mL via ORAL
  Filled 2021-02-15 (×4): qty 20

## 2021-02-15 MED ORDER — PROPOFOL 10 MG/ML IV BOLUS
INTRAVENOUS | Status: AC
  Filled 2021-02-15: qty 20

## 2021-02-15 MED ORDER — EPHEDRINE SULFATE 50 MG/ML IJ SOLN
INTRAMUSCULAR | Status: DC | PRN
  Administered 2021-02-15 (×2): 10 mg via INTRAVENOUS

## 2021-02-15 NOTE — Care Management Important Message (Signed)
Important Message  Patient Details  Name: Heather Peterson MRN: [id number] Date of Birth: [birth year]   Medicare Important Message Given:  Yes     Dannette Barbara 02/15/2021, 4:33 PM

## 2021-02-15 NOTE — Anesthesia Postprocedure Evaluation (Signed)
Anesthesia Post Note  Patient: Heather Peterson  Procedure(s) Performed: ESOPHAGOGASTRODUODENOSCOPY (EGD) WITH PROPOFOL  Patient location during evaluation: Endoscopy Anesthesia Type: General Level of consciousness: awake and alert Pain management: pain level controlled Vital Signs Assessment: post-procedure vital signs reviewed and stable Respiratory status: spontaneous breathing, nonlabored ventilation, respiratory function stable and patient connected to nasal cannula oxygen Cardiovascular status: blood pressure returned to baseline and stable Postop Assessment: no apparent nausea or vomiting Anesthetic complications: no   No notable events documented.   Last Vitals:  Vitals:   02/15/21 1538 02/15/21 1545  BP: (!) 81/44 (!) 92/50  Pulse: 70 74  Resp: 16 15  Temp: (!) 36.3 C   SpO2: 100%     Last Pain:  Vitals:   02/15/21 1538  TempSrc: Oral  PainSc:                  [place] Ouch

## 2021-02-15 NOTE — Progress Notes (Signed)
PT Cancellation Note  Patient Details Name: JASIEL APACHITO MRN: [id number] DOB: 1941-02-26   Cancelled Treatment:    Reason Eval/Treat Not Completed: Patient at procedure or test/unavailable (Evaluation re-attempted. Patient currently off unit for procedure.  Will re-attempt at later time/date as medically appropriate and available.)  Abbagayle Zaragoza H. Owens Shark, PT, DPT, NCS 02/15/21, 10:12 AM [phone]

## 2021-02-15 NOTE — Evaluation (Signed)
Physical Therapy Evaluation Patient Details Name: Heather Peterson MRN: [id number] DOB: 23-Sep-1940 Today's Date: 02/15/2021  History of Present Illness  presented to ER secondary to progressive weakness; admitted for management of acute/chronic anemia, s/p EGD significant for multiple non-bleeding angioectasias, treated with argon beam coagulation (02/15/21).  No active bleeding noted per report  Clinical Impression  Patient resting in bed upon arrival to room; husband and sister-in-law at bedside.  Alert and oriented; endorses "feeling better" since admission.  Denies pain.  Generally weak and deconditioned throughout all extremities, but no focal weakness appreciated.  Currently requiring min assist for bed mobility; close sup for unsupported sitting balance.  Persistent hypotension noted throughout session, trending downward with transition to upright (patient denies symptoms).  Additional OOB/gait efforts deferred as result.  RN/MD informed and aware.  Will continue to assess/progress in subsequent sessions as medically appropriate Would benefit from skilled PT to address above deficits and promote optimal return to PLOF.; will defer formal discharge recommendation until next session pending full mobility assessment.  Patient/family hopeful for return home with [facility] services.       Recommendations for follow up therapy are one component of a multi-disciplinary discharge planning process, led by the attending physician.  Recommendations may be updated based on patient status, additional functional criteria and insurance authorization.  Follow Up Recommendations  (pending additional mobility assessment; patient/family hopeful for discharge home with continued HHPT)    Equipment Recommendations       Recommendations for Other Services       Precautions / Restrictions Precautions Precautions: Fall;ICD/Pacemaker Restrictions Weight Bearing Restrictions: No      Mobility  Bed  Mobility Overal bed mobility: Needs Assistance Bed Mobility: Supine to Sit;Sit to Supine     Supine to sit: Min assist Sit to supine: Min assist        Transfers                 General transfer comment: deferred due to hypotension  Ambulation/Gait             General Gait Details: deferred due to hypotension  Stairs            Wheelchair Mobility    Modified Rankin (Stroke Patients Only)       Balance Overall balance assessment: Needs assistance Sitting-balance support: No upper extremity supported;Feet supported Sitting balance-Leahy Scale: Good         Standing balance comment: deferred due to hypotension                             Pertinent Vitals/Pain Pain Assessment: No/denies pain    Home Living Family/patient expects to be discharged to:: Private residence Living Arrangements: Spouse/significant other;Children Available Help at Discharge: Family;Available 24 hours/day Type of Home: House Home Access: Ramped entrance     Home Layout: One level Home Equipment: Walker - 4 wheels;Cane - single point;Shower seat;Grab bars - tub/shower      Prior Function Level of Independence: Needs assistance         Comments: Sup/mod indep for basic transfers and limited household mobility with 4WRW; assist from family (husband, daughter) as needed for ADLs, household responsbilities.  Does endorse multiple fall history (no quuantified)     Hand Dominance        Extremity/Trunk Assessment   Upper Extremity Assessment Upper Extremity Assessment: Generalized weakness    Lower Extremity Assessment Lower Extremity Assessment: Generalized weakness (grossly 4-/5 throughout)  Communication   Communication: No difficulties  Cognition Arousal/Alertness: Awake/alert Behavior During Therapy: WFL for tasks assessed/performed Overall Cognitive Status: Within Functional Limits for tasks assessed                                         General Comments      Exercises Other Exercises Other Exercises: Orthostatic BP assessment-supine BP 85/43, HR 70; sitting BP 80/40, HR 74; sitting x5 min BP 75/51, HR 73; return to supine BP 86/49, HR 76.  Asymptomatic throughout Other Exercises: Seated LE therex, 1x10, active ROM for muscular strength/flexibility   Assessment/Plan    PT Assessment Patient needs continued PT services  PT Problem List Decreased strength;Decreased activity tolerance;Decreased balance;Decreased mobility;Decreased safety awareness;Decreased knowledge of use of DME;Decreased knowledge of precautions       PT Treatment Interventions DME instruction;Gait training;Functional mobility training;Patient/family education;Therapeutic activities;Balance training;Therapeutic exercise    PT Goals (Current goals can be found in the Care Plan section)  Acute Rehab PT Goals Patient Stated Goal: to go home PT Goal Formulation: With patient/family Time For Goal Achievement: 03/01/21 Potential to Achieve Goals: Fair    Frequency Min 2X/week   Barriers to discharge        Co-evaluation               AM-PAC PT "6 Clicks" Mobility  Outcome Measure Help needed turning from your back to your side while in a flat bed without using bedrails?: A Little Help needed moving from lying on your back to sitting on the side of a flat bed without using bedrails?: A Little Help needed moving to and from a bed to a chair (including a wheelchair)?: A Lot Help needed standing up from a chair using your arms (e.g., wheelchair or bedside chair)?: A Lot Help needed to walk in hospital room?: A Lot Help needed climbing 3-5 steps with a railing? : A Lot 6 Click Score: 14    End of Session   Activity Tolerance: Treatment limited secondary to medical complications (Comment) Patient left: in bed;with call bell/phone within reach;with bed alarm set   PT Visit Diagnosis: Muscle weakness (generalized)  (M62.81);Difficulty in walking, not elsewhere classified (R26.2);History of falling (Z91.81)    Time: 1643-5391 PT Time Calculation (min) (ACUTE ONLY): 32 min   Charges:   PT Evaluation $PT Eval Moderate Complexity: 1 Mod PT Treatments $Therapeutic Activity: 8-22 mins        Jerris Keltz H. Owens Shark, PT, DPT, NCS 02/15/21, 10:12 PM [phone]

## 2021-02-15 NOTE — Transfer of Care (Addendum)
Immediate Anesthesia Transfer of Care Note  Patient: Heather Peterson  Procedure(s) Performed: ESOPHAGOGASTRODUODENOSCOPY (EGD) WITH PROPOFOL  Patient Location: PACU  Anesthesia Type:General  Level of Consciousness: awake, alert  and oriented  Airway & Oxygen Therapy: Patient Spontanous Breathing and Patient connected to nasal cannula oxygen  Post-op Assessment: Report given to RN and Post -op Vital signs reviewed and stable  Post vital signs: Reviewed and stable  Last Vitals:  Vitals Value Taken Time  BP 96/56 02/15/21 1050  Temp    Pulse 80 02/15/21 1051  Resp 17 02/15/21 1051  SpO2 98 % 02/15/21 1051  Vitals shown include unvalidated device data.  Last Pain:  Vitals:   02/15/21 1002  TempSrc: Temporal  PainSc: 0-No pain         Complications: No notable events documented.

## 2021-02-15 NOTE — Anesthesia Preprocedure Evaluation (Addendum)
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 on medications + CAD, + Past MI, + Cardiac Stents, +CHF (LVEF of 25%) and + DOE  + pacemaker + Cardiac Defibrillator + Valvular Problems/Murmurs MR  Rhythm:Regular Rate:Normal + Systolic murmurs- Diastolic murmurs    Neuro/Psych neg Seizures PSYCHIATRIC DISORDERS Anxiety Depression negative neurological ROS     GI/Hepatic Neg liver ROS, GERD  Controlled and Medicated,  Endo/Other  diabetes, Insulin DependentHypothyroidism   Renal/GU CRFRenal disease (AKI on CKD)     Musculoskeletal negative musculoskeletal ROS (+)   Abdominal (+) + obese,   Peds negative pediatric ROS (+)  Hematology  (+) anemia ,   Anesthesia Other Findings 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 (last treatment was on 09/30 Symptomatic anemia, hgb 6 on admission, s/p prbcs Lactic acid acidosis  Past Medical History: No date: Anxiety No date: Chronic combined systolic and diastolic CHF (congestive  heart failure) (HCC) No date: Chronic kidney disease No date: Coronary artery disease No date: Depression No date: Diabetes mellitus without complication (HCC) No date: Diabetes mellitus, type II (HCC) No date: Hypertension No date: MI (myocardial infarction) ([place])     Comment:  x 5 No date: Pacemaker No date:  Prolonged Q-T interval on ECG No date: Thyroid disease   Reproductive/Obstetrics                           Anesthesia Physical  Anesthesia Plan  ASA: IV  Anesthesia Plan: General   Post-op Pain Management:    Induction: Intravenous  PONV Risk Score and Plan: 1 and TIVA and Treatment may vary due to age or medical condition  Airway Management Planned: Natural Airway and Simple Face Mask  Additional Equipment:   Intra-op Plan:   Post-operative Plan:   Informed Consent: I have reviewed the patients History and Physical, chart, labs and discussed the procedure including the risks, benefits and alternatives for the proposed anesthesia with the patient or authorized representative who has indicated his/her understanding and acceptance.     Dental advisory given  Plan Discussed with: CRNA and Anesthesiologist  Anesthesia Plan Comments:       Anesthesia Quick Evaluation

## 2021-02-15 NOTE — Progress Notes (Signed)
Made MD aware of low BP. Manual BP 92/50, patient is asymptomatic. No complaints of dizziness. Scheduled midodrine given. No other interventions ordered at this time. Will continue to monitor.

## 2021-02-15 NOTE — Op Note (Signed)
[facility] Gastroenterology Patient Name: Heather Peterson Procedure Date: 02/15/2021 10:30 AM MRN: [id number] Account #: [id number] Date of Birth: Jan 31, 1941 Admit Type: Inpatient Age: 80 Room: [facility] ENDO ROOM 3 Gender: Female Note Status: Finalized Instrument Name: Upper Endoscope [id number] Procedure:             Upper GI endoscopy Indications:           Iron deficiency anemia secondary to chronic blood loss Providers:             [name] MD, MD Referring MD:          [name] (Referring MD) Medicines:             Monitored Anesthesia Care Complications:         No immediate complications. Procedure:             Pre-Anesthesia Assessment:                        - Prior to the procedure, a History and Physical was                         performed, and patient medications, allergies and                         sensitivities were reviewed. The patient's tolerance                         of previous anesthesia was reviewed.                        - The risks and benefits of the procedure and the                         sedation options and risks were discussed with the                         patient. All questions were answered and informed                         consent was obtained.                        - ASA Grade Assessment: III - A patient with severe                         systemic disease.                        After obtaining informed consent, the endoscope was                         passed under direct vision. Throughout the procedure,                         the patient's blood pressure, pulse, and oxygen                         saturations were monitored continuously. The Endoscope  was introduced through the mouth, and advanced to the                         third part of duodenum. The upper GI endoscopy was                         accomplished with ease. The patient tolerated the                          procedure well. Findings:      The examined duodenum was normal.      Diffuse, white plaques were found in the entire esophagus. Brushings for       KOH prep were obtained in the entire esophagus.      Five 5 to 9 mm angioectasias with no bleeding were found on the greater       curvature of the stomach. Coagulation for bleeding prevention using       argon beam at 0.5 liters/minute and 20 watts was successful.      The cardia and gastric fundus were normal on retroflexion. Impression:            - Normal examined duodenum.                        - Esophageal plaques were found, suspicious for                         candidiasis. Brushings performed.                        - Five non-bleeding angioectasias in the stomach.                         Treated with argon beam coagulation. Recommendation:        - Return patient to hospital ward for ongoing care.                        - Advance diet as tolerated.                        - Continue present medications.                        - Continue PPI                        GI will sign out Procedure Code(s):     --- Professional ---                        [id number], Esophagogastroduodenoscopy, flexible,                         transoral; with control of bleeding, any method Diagnosis Code(s):     --- Professional ---                        K22.9, Disease of esophagus, unspecified                        K31.819, Angiodysplasia of stomach and duodenum  without bleeding                        D50.0, Iron deficiency anemia secondary to blood loss                         (chronic) CPT copyright 2019 American Medical Association. All rights reserved. The codes documented in this report are preliminary and upon coder review may  be revised to meet current compliance requirements. [name], MD [name] MD, MD 02/15/2021 10:50:03 AM This report has been signed electronically. Number of Addenda: 0 Note Initiated On:  02/15/2021 10:30 AM Estimated Blood Loss:  Estimated blood loss: none.      [facility]

## 2021-02-15 NOTE — H&P (Signed)
[name], MD [address], [place], [place], [place], [address] [address], [place], [place], [place], [address] Phone: [phone]  Fax: [phone]  Primary Care Physician:  [name], MD   Pre-Procedure History & Physical: HPI:  Heather Peterson is a 80 y.o. 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[place])    Prolonged Q-T interval on ECG    Thyroid disease     Past Surgical History:  Procedure Laterality Date   CENTRAL LINE INSERTION  03/11/2017   Procedure: CENTRAL LINE INSERTION;  Surgeon: Leonie Man, MD;  Location: [place] [facility];  Service: Cardiovascular;;   CHOLECYSTECTOMY     COLONOSCOPY WITH PROPOFOL N/A 09/01/2019   Procedure: COLONOSCOPY WITH PROPOFOL;  Surgeon: Toledo, Benay Pike, MD;  Location: [facility] ENDOSCOPY;  Service: Gastroenterology;  Laterality: N/A;   CORONARY STENT INTERVENTION W/IMPELLA N/A 03/11/2017   Procedure: Coronary Stent Intervention w/Impella;  Surgeon: Leonie Man, MD;  Location: [place] [facility];  Service: Cardiovascular;  Laterality: N/A;   ESOPHAGOGASTRODUODENOSCOPY (EGD) WITH PROPOFOL N/A 09/01/2019   Procedure: ESOPHAGOGASTRODUODENOSCOPY (EGD) WITH PROPOFOL;  Surgeon: Toledo, Benay Pike, MD;  Location: [facility] ENDOSCOPY;  Service: Gastroenterology;  Laterality: N/A;   ESOPHAGOGASTRODUODENOSCOPY (EGD) WITH PROPOFOL N/A 09/08/2019   Procedure: ESOPHAGOGASTRODUODENOSCOPY (EGD) WITH PROPOFOL;  Surgeon: [name], MD;  Location: [facility] ENDOSCOPY;  Service: Gastroenterology;  Laterality: N/A;   EYE SURGERY     HIP ARTHROPLASTY Right  03/29/2020   Procedure: ARTHROPLASTY BIPOLAR HIP (HEMIARTHROPLASTY);  Surgeon: Corky Mull, MD;  Location: [facility] ORS;  Service: Orthopedics;  Laterality: Right;   INTRAVASCULAR PRESSURE WIRE/FFR STUDY N/A 03/11/2017   Procedure: INTRAVASCULAR PRESSURE WIRE/FFR STUDY;  Surgeon: Leonie Man, MD;  Location: [place] [facility];  Service: Cardiovascular;  Laterality: N/A;   INTRAVASCULAR ULTRASOUND/IVUS N/A 03/11/2017   Procedure: Intravascular Ultrasound/IVUS;  Surgeon: Leonie Man, MD;  Location: [place] [facility];  Service: Cardiovascular;  Laterality: N/A;   LEFT HEART CATH AND CORONARY ANGIOGRAPHY N/A 03/05/2017   Procedure: LEFT HEART CATH AND CORONARY ANGIOGRAPHY;  Surgeon: Isaias Cowman, MD;  Location: [place] [facility];  Service: Cardiovascular;  Laterality: N/A;   PACEMAKER IMPLANT     pacemaker/defibrillator Left     Prior to Admission medications   Medication Sig Start Date End Date Taking? Authorizing Provider  allopurinol (ZYLOPRIM) 100 MG tablet Take 200 mg by mouth daily.   Yes [provider]  aspirin EC 81 MG tablet Take 81 mg by mouth at bedtime.    Yes [provider]  docusate sodium (COLACE) 100 MG capsule Take 100 mg by mouth 2 (two) times daily.   Yes [provider]  fexofenadine (ALLEGRA) 180 MG tablet Take 180 mg by mouth daily.   Yes [provider]  FLUoxetine (PROZAC) 10 MG capsule Take 10 mg by mouth daily. 06/29/19  Yes [provider]  gabapentin (NEURONTIN) 100 MG capsule TAKE 1 CAPSULE(100 MG) BY MOUTH THREE TIMES DAILY Patient taking differently:  Patient reports 100 mg QAM, 700 mg QHS 12/29/20  Yes Lloyd Huger, MD  levothyroxine (SYNTHROID) 50 MCG tablet Take 50 mcg by mouth daily before breakfast. 08/14/20  Yes [provider]  levothyroxine (SYNTHROID) 75 MCG tablet Take 1 tablet (75 mcg total) by mouth daily before breakfast. On Monday, Wednesday and Friday 04/03/20  Yes [name], MD  magnesium oxide (MAG-OX) 400 MG tablet Take 200 mg by mouth daily.   Yes [provider]  mexiletine (MEXITIL) 200 MG capsule Take 1 capsule (200 mg total) by mouth every 12 (twelve) hours. 03/14/17  Yes Seiler, Amber K, NP  pantoprazole (PROTONIX) 40 MG tablet Take 40 mg by mouth daily.  05/16/19  Yes [provider]  polyethylene glycol (MIRALAX) 17 g packet Take 17 g by mouth daily. 06/18/20  Yes Nance Pear, MD  Probiotic Product (PROBIOTIC DAILY PO) Take 1 tablet by mouth daily.   Yes [provider]  simvastatin (ZOCOR) 40 MG tablet Take 20 mg by mouth at bedtime.    Yes [provider]  spironolactone (ALDACTONE) 25 MG tablet Take 0.5 tablets (12.5 mg total) by mouth every other day. Patient taking differently: Take 25 mg by mouth daily. 01/03/21 04/03/21 Yes Hackney, Otila Kluver A, FNP  traZODone (DESYREL) 50 MG tablet Take 50 mg by mouth at bedtime.   Yes [provider]  dexamethasone (DECADRON) 4 MG tablet Take 5 tablets (20 mg total) by mouth daily. Take the day after Velcade on days 2,5,9,12. Take with breakfast 11/14/20   Lloyd Huger, MD  Ensure Max Protein (ENSURE MAX PROTEIN) LIQD Take 330 mLs (11 oz total) by mouth 2 (two) times daily between meals. 04/03/20   [name], MD  gabapentin (NEURONTIN) 600 MG tablet Take 600 mg by mouth daily. Patient takes 100 mg QAM, 700 mg QHS Patient not taking: No sig reported 11/07/20   [provider]  HYDROcodone-acetaminophen (NORCO/VICODIN) 5-325 MG tablet  01/29/21   [provider]  hydrocortisone (ANUSOL-HC) 2.5 % rectal cream Apply 1 application topically 2 (two) times daily. 02/12/20   [provider]  Lactulose 20 GM/30ML SOLN Take 30 mLs (20 g total) by mouth in the morning and at bedtime. 06/23/20   Jacquelin Hawking, NP  midodrine (PROAMATINE) 10 MG tablet Take 1 tablet (10 mg total) by mouth 2 (two) times daily with a meal. 04/03/20   [name], MD   OXYGEN Inhale 2 L into the lungs continuous.    [provider]  potassium chloride SA (KLOR-CON) 20 MEQ tablet Take 20 mEq by mouth daily. Patient not taking: No sig reported    [provider]  Prenatal Vit-Fe Fumarate-FA (PRENATAL MULTIVITAMIN) TABS tablet Take 1 tablet by mouth daily. Patient not taking: No sig reported    [provider]  SANTYL ointment Apply topically. 01/01/21   [provider]  torsemide (DEMADEX) 20 MG tablet Take 40 mg by mouth daily.    [provider]  zolpidem (AMBIEN) 5 MG tablet Take 1 tablet (5 mg total) by mouth at bedtime as needed for sleep. Patient not taking: No sig reported 11/22/20   Verlon Au, NP    Allergies as of 02/12/2021 - Review Complete 02/12/2021  Allergen Reaction Noted   Celebrex [celecoxib] Anaphylaxis 02/04/2017   Glipizide Anaphylaxis 08/03/2013   Levaquin [levofloxacin in d5w] Other (See Comments) 02/04/2017   Levofloxacin Other (See Comments) and Palpitations 02/10/2017   Lisinopril Swelling 05/19/2019   Sulfa antibiotics  Other (See Comments) and Anaphylaxis 08/03/2013   Metformin Other (See Comments) 08/03/2013   Penicillins Rash and Other (See Comments) 08/03/2013    Family History  Problem Relation Age of Onset   Hypertension Father    Heart attack Father    Depression Sister    Depression Brother    Depression Brother     Social History   Socioeconomic History   Marital status: Married    Spouse name: rodney   Number of children: 2   Years of education: Not on file   Highest education level: High school graduate  Occupational History   Not on file  Tobacco Use   Smoking status: Every Day    Packs/day: 0.25    Types: E-cigarettes, Cigarettes   Smokeless tobacco: Never  Vaping Use   Vaping Use: Former  Substance and Sexual Activity   Alcohol use: Not Currently    Comment: occasionally   Drug use: Yes    Comment: prescribed pain meds   Sexual activity: Not  Currently  Other Topics Concern   Not on file  Social History Narrative   Not on file   Social Determinants of Health   Financial Resource Strain: Not on file  Food Insecurity: Not on file  Transportation Needs: Not on file  Physical Activity: Not on file  Stress: Not on file  Social Connections: Not on file  Intimate Partner Violence: Not on file    Review of Systems: See HPI, otherwise negative ROS  Physical Exam: BP (!) 91/58   Pulse 73   Temp (!) 97 F (36.1 C) (Temporal)   Resp 16   Ht 5' 3"  (1.6 m)   Wt 68.2 kg   SpO2 100%   BMI 26.63 kg/m  General:   Alert,  pleasant and cooperative in NAD Head:  Normocephalic and atraumatic. Neck:  Supple; no masses or thyromegaly. Lungs:  Clear throughout to auscultation, normal respiratory effort.    Heart:  +S1, +S2, Regular rate and rhythm, No edema. Abdomen:  Soft, nontender and nondistended. Normal bowel sounds, without guarding, and without rebound.   Neurologic:  Alert and  oriented x4;  grossly normal neurologically.  Impression/Plan: Heather Peterson is here for an endoscopy  to be performed for  evaluation of gi bleeding     Risks, benefits, limitations, and alternatives regarding endoscopy have been reviewed with the patient.  Questions have been answered.  All parties agreeable.   [name], MD  02/15/2021, 10:26 AM

## 2021-02-15 NOTE — Progress Notes (Signed)
PROGRESS NOTE    Heather Peterson  IRS:[id number] DOB: 09-30-1940 DOA: 02/12/2021 PCP: [name], MD    Brief Narrative:  80 y.o. 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  Patient states that she fell on the morning of her admission but denies hitting her head or any loss of consciousness.  Patient underwent upper EGD on 10/6.  Multiple nonbleeding angioectasias treated with argon beam coagulation.  Esophageal plaques concerning for candidiasis were found.  Brushings performed.  No signs of active bleed.  GI recommends continue present medications and PPI and advance diet as tolerated.    Assessment & Plan:   Principal Problem:   Symptomatic anemia Active Problems:   CKD stage 4 due to type 2 diabetes mellitus (HCC)   Chronic systolic CHF (congestive heart failure) (HCC)   ICD (implantable cardioverter-defibrillator) in place   Anemia in chronic kidney disease (CKD)   Multiple myeloma (HCC)   Hyperkalemia   Chronic respiratory failure (HCC)   Leukocytosis   Lactic acid acidosis  acute on chronic anemia  Hx of AVM treated in 2021 --Hgb appeared to be gradually trending down, 6.0 on presentation. --likely due to upper GI bleed, since BUN 109 markedly elevated.   --s/p 1u pRBC with appropriate rise to 7.1 -Hemoglobin 6.7 on 10/5 -Improved to 7.5 after transfusion -EGD on 10/6.  No active bleeding.  Nonbleeding ulcers treated with APC Plan: Return to hospital ward Continue PPI Advance diet as tolerated Check a.m. hemoglobin, if stable patient may be able to discharge home  Diabetes mellitus with  complications of stage IV chronic kidney disease --No hypoglycemics listed on home med --BG's have been elevated  Plan: --SSI TID for now   AKI on CKD4 --Cr 2.0 on presentation.  Improved to 1.39 with IVF. --torsemide and spironolactone held on presentation. Plan: No further IVF Hold Demadex and Aldactone for 1 additional day due to hypotension   Hyperkalemia, resolved Probably medication induced. Without EKG changes --s/p dextrose and insulin -- Hold Aldactone   History of chronic systolic heart failure Last known LVEF of 25% from February, 2022 --not fluid overloaded --continue to hold torsemide and spironolactone due to AKI and hypotension   Hypotension --pt has midodrine and torsemide and spironolactone on home med list Plan: --cont midodrine --continue to hold torsemide and spironolactone    History of multiple myeloma On chemotherapy Follow-up with oncology as an outpatient   Sepsis, ruled out --Leukocytosis likely reactive.  No fever.  Procal 0.17.  No source of infection currently. --Patient started on broad-spectrum antibiotic therapy in the ER with vancomycin, cefepime and Flagyl -- No further antibiotics.  Low suspicion for infection   DVT prophylaxis: SCD Code Status: Full Family Communication: Linde Gillis [phone] on 10/5  Disposition Plan: Status is: Inpatient  Remains inpatient appropriate because:Inpatient level of care appropriate due to severity of illness  Dispo: The patient is from: Home              Anticipated d/c is to: Home              Patient currently is not medically stable to d/c.   Difficult to place patient No  Anticipated date of discharge 10/7     Level of care: Med-Surg  Consultants:  GI  Procedures:  EGD 10/6  Antimicrobials: None   Subjective: Patient seen and examined.  No acute status changes overnight.  No new complaints this morning.  Objective: Vitals:   02/15/21 1100 02/15/21 1110 02/15/21 1120  02/15/21 1151  BP: (!) 81/55 (!) 92/53 (!) 94/58 (!) 98/56  Pulse: 75 73 74 73  Resp: 14 17 (!) 23 16  Temp:    97.9 F (36.6 C)  TempSrc:    Oral  SpO2: 98% 99% 100% 100%  Weight:      Height:        Intake/Output Summary (Last 24 hours) at 02/15/2021 1248 Last data filed at 02/15/2021 1045 Gross per 24 hour  Intake 1364 ml  Output 800 ml  Net 564 ml   Filed Weights   02/13/21 1534 02/15/21 1002  Weight: 74.1 kg 68.2 kg    Examination:  General exam: No acute distress.  Sitting up in bed Respiratory system: Lungs clear.  No work of breathing.  3 L Cardiovascular system: S1-S2, RRR, no murmurs, trace pedal edema Gastrointestinal system: Obese, NT/ND, normal bowel sounds Central nervous system: Alert and oriented. No focal neurological deficits. Extremities: Symmetric 5 x 5 power. Skin: No rashes, lesions or ulcers Psychiatry: Judgement and insight appear normal. Mood & affect appropriate.     Data Reviewed: I have personally reviewed following labs and imaging studies  CBC: Recent Labs  Lab 02/12/21 0648 02/13/21 0655 02/14/21 0352 02/15/21 0229  WBC 23.7* 20.0* 18.1* 18.7*  19.1*  NEUTROABS 18.9*  --   --  14.8*  HGB 6.0* 7.1* 6.7* 7.6*  7.5*  HCT 19.5* 21.4* 21.9* 22.8*  22.9*  MCV 84.1 85.3 93.6 85.4  86.7  PLT 111* 82* 70* 118*  701*   Basic Metabolic Panel: Recent Labs  Lab 02/12/21 0617 02/12/21 2021 02/13/21 0655 02/14/21 0352 02/15/21 0229  NA 132* 136 138 139 137  K 5.5* 5.1 5.1 4.3 4.6  CL 94* 106 106 109 107  CO2 27 21* 23 23 25   GLUCOSE 353* 204* 207* 183* 121*  BUN 135* 109* 86* 65* 55*  CREATININE 2.00* 1.48* 1.39* 1.06* 1.06*  CALCIUM 9.2 8.7* 8.9 8.7* 8.6*  MG  --   --   --  2.5* 2.5*   GFR: Estimated Creatinine Clearance: 39.9 mL/min (A) (by C-G formula based on SCr of 1.06 mg/dL (H)). Liver Function Tests: Recent Labs  Lab 02/12/21 0617  AST 23  ALT 19  ALKPHOS 68  BILITOT 0.8  PROT 6.6  ALBUMIN 3.8   No results  for input(s): LIPASE, AMYLASE in the last 168 hours. No results for input(s): AMMONIA in the last 168 hours. Coagulation Profile: Recent Labs  Lab 02/12/21 0803  INR 1.2   Cardiac Enzymes: No results for input(s): CKTOTAL, CKMB, CKMBINDEX, TROPONINI in the last 168 hours. BNP (last 3 results) No results for input(s): PROBNP in the last 8760 hours. HbA1C: Recent Labs    02/14/21 0352  HGBA1C 7.1*   CBG: Recent Labs  Lab 02/14/21 1140 02/14/21 1630 02/14/21 2210 02/15/21 0752 02/15/21 1153  GLUCAP 186* 176* 151* 127* 160*   Lipid Profile: No results for input(s): CHOL, HDL, LDLCALC, TRIG, CHOLHDL, LDLDIRECT in the last 72 hours. Thyroid Function Tests: No results for input(s): TSH, T4TOTAL, FREET4, T3FREE, THYROIDAB in the last 72 hours. Anemia Panel: No results for input(s): VITAMINB12, FOLATE, FERRITIN, TIBC, IRON, RETICCTPCT  in the last 72 hours.  Sepsis Labs: Recent Labs  Lab 02/12/21 0617 02/12/21 0950 02/12/21 1606 02/12/21 2021 02/13/21 0725 02/15/21 0229  PROCALCITON  --   --   --   --  0.17 <0.10  LATICACIDVEN 2.9* 2.6* 2.8* 2.1*  --   --     Recent Results (from the past 240 hour(s))  Blood Culture (routine x 2)     Status: None (Preliminary result)   Collection Time: 02/12/21  6:48 AM   Specimen: BLOOD  Result Value Ref Range Status   Specimen Description BLOOD LEFT ARM  Final   Special Requests   Final    BOTTLES DRAWN AEROBIC AND ANAEROBIC Blood Culture adequate volume   Culture   Final    NO GROWTH 3 DAYS Performed at [facility], [address] [place], [place] [address]    Report Status PENDING  Incomplete  Blood Culture (routine x 2)     Status: None (Preliminary result)   Collection Time: 02/12/21  6:48 AM   Specimen: BLOOD LEFT HAND  Result Value Ref Range Status   Specimen Description BLOOD LEFT HAND  Final   Special Requests   Final    BOTTLES DRAWN AEROBIC ONLY Blood Culture adequate volume   Culture   Final    NO  GROWTH 3 DAYS Performed at [facility], [address] [place], [place] [address]    Report Status PENDING  Incomplete  Resp Panel by RT-PCR (Flu A&B, Covid) Nasopharyngeal Swab     Status: None   Collection Time: 02/12/21  7:20 AM   Specimen: Nasopharyngeal Swab; Nasopharyngeal(NP) swabs in vial transport medium  Result Value Ref Range Status   SARS Coronavirus 2 by RT PCR NEGATIVE NEGATIVE Final    Comment: (NOTE) SARS-CoV-2 target nucleic acids are NOT DETECTED.  The SARS-CoV-2 RNA is generally detectable in upper respiratory specimens during the acute phase of infection. The lowest concentration of SARS-CoV-2 viral copies this assay can detect is 138 copies/mL. A negative result does not preclude SARS-Cov-2 infection and should not be used as the sole basis for treatment or other patient management decisions. A negative result may occur with  improper specimen collection/handling, submission of specimen other than nasopharyngeal swab, presence of viral mutation(s) within the areas targeted by this assay, and inadequate number of viral copies(<138 copies/mL). A negative result must be combined with clinical observations, patient history, and epidemiological information. The expected result is Negative.  Fact Sheet for Patients:  EntrepreneurPulse.com.au  Fact Sheet for Healthcare Providers:  IncredibleEmployment.be  This test is no t yet approved or cleared by the [place] FDA and  has been authorized for detection and/or diagnosis of SARS-CoV-2 by FDA under an Emergency Use Authorization (EUA). This EUA will remain  in effect (meaning this test can be used) for the duration of the COVID-19 declaration under Section 564(b)(1) of the Act, 21 U.S.C.section 360bbb-3(b)(1), unless the authorization is terminated  or revoked sooner.       Influenza A by PCR NEGATIVE NEGATIVE Final   Influenza B by PCR NEGATIVE NEGATIVE Final     Comment: (NOTE) The Xpert Xpress SARS-CoV-2/FLU/RSV plus assay is intended as an aid in the diagnosis of influenza from Nasopharyngeal swab specimens and should not be used as a sole basis for treatment. Nasal washings and aspirates are unacceptable for Xpert Xpress SARS-CoV-2/FLU/RSV testing.  Fact Sheet for Patients: EntrepreneurPulse.com.au  Fact Sheet for Healthcare Providers: IncredibleEmployment.be  This test is not yet approved  or cleared by the [place] and has been authorized for detection and/or diagnosis of SARS-CoV-2 by FDA under an Emergency Use Authorization (EUA). This EUA will remain in effect (meaning this test can be used) for the duration of the COVID-19 declaration under Section 564(b)(1) of the Act, 21 U.S.C. section 360bbb-3(b)(1), unless the authorization is terminated or revoked.  Performed at [facility], [address] [place], [place] [address]   Urine Culture     Status: Abnormal   Collection Time: 02/12/21  1:30 PM   Specimen: Urine, Random  Result Value Ref Range Status   Specimen Description   Final    URINE, RANDOM Performed at [facility], [address], [place], [place] [address]    Special Requests   Final    NONE Performed at [facility], [address] [place], [place] [address]    Culture (A)  Final    <10,000 COLONIES/mL INSIGNIFICANT GROWTH Performed at [facility], [place] [address], [place], [place] [address]    Report Status 02/13/2021 FINAL  Final  KOH prep     Status: None   Collection Time: 02/15/21 10:54 AM   Specimen: Bronchial Brush  Result Value Ref Range Status   Specimen Description BRONCHIAL BRUSHING  Final   Special Requests NONE  Final   KOH Prep   Final    BUDDING YEAST SEEN Performed at [facility], [address] [place],  [address]    Report Status 02/15/2021 FINAL  Final         Radiology  Studies: No results found.      Scheduled Meds:  sodium chloride   Intravenous Once   acidophilus  1 capsule Oral Daily   allopurinol  200 mg Oral Daily   aspirin EC  81 mg Oral QHS   docusate sodium  100 mg Oral BID   FLUoxetine  10 mg Oral Daily   gabapentin  100 mg Oral Daily   And   gabapentin  700 mg Oral QHS   insulin aspart  0-15 Units Subcutaneous TID WC   levothyroxine  50 mcg Oral Q0600   [START ON 02/16/2021] levothyroxine  75 mcg Oral Q M,W,F   loratadine  10 mg Oral Daily   magnesium oxide  200 mg Oral Daily   mexiletine  200 mg Oral Q12H   midodrine  10 mg Oral BID WC   pantoprazole (PROTONIX) IV  40 mg Intravenous Q12H   polyethylene glycol  17 g Oral Daily   Ensure Max Protein  11 oz Oral BID BM   simvastatin  20 mg Oral QHS   traZODone  50 mg Oral QHS   Continuous Infusions:   LOS: 3 days    Time spent: 25 minutes    Sidney Ace, MD [facility]   If 7PM-7AM, please contact night-coverage  02/15/2021, 12:48 PM

## 2021-02-16 ENCOUNTER — Telehealth: Payer: Self-pay | Admitting: *Deleted

## 2021-02-16 ENCOUNTER — Encounter: Payer: Self-pay | Admitting: Gastroenterology

## 2021-02-16 DIAGNOSIS — D649 Anemia, unspecified: Secondary | ICD-10-CM

## 2021-02-16 LAB — MAGNESIUM: Magnesium: 2.4 mg/dL (ref 1.7–2.4)

## 2021-02-16 LAB — BASIC METABOLIC PANEL
Anion gap: 5 (ref 5–15)
BUN: 36 mg/dL — ABNORMAL HIGH (ref 8–23)
CO2: 26 mmol/L (ref 22–32)
Calcium: 8.4 mg/dL — ABNORMAL LOW (ref 8.9–10.3)
Chloride: 100 mmol/L (ref 98–111)
Creatinine, Ser: 0.93 mg/dL (ref 0.44–1.00)
GFR, Estimated: >60 mL/min (ref >60)
Glucose, Bld: 128 mg/dL — ABNORMAL HIGH (ref 70–99)
Potassium: 4.3 mmol/L (ref 3.5–5.1)
Sodium: 131 mmol/L — ABNORMAL LOW (ref 135–145)

## 2021-02-16 LAB — GLUCOSE, CAPILLARY
Glucose-Capillary: 126 mg/dL — ABNORMAL HIGH (ref 70–99)
Glucose-Capillary: 113 mg/dL — ABNORMAL HIGH (ref 70–99)
Glucose-Capillary: 124 mg/dL — ABNORMAL HIGH (ref 70–99)
Glucose-Capillary: 186 mg/dL — ABNORMAL HIGH (ref 70–99)

## 2021-02-16 LAB — CBC
HCT: 22.2 % — ABNORMAL LOW (ref 36.0–46.0)
Hemoglobin: 7.2 g/dL — ABNORMAL LOW (ref 12.0–15.0)
MCH: 27.8 pg (ref 26.0–34.0)
MCHC: 32.4 g/dL (ref 30.0–36.0)
MCV: 85.7 fL (ref 80.0–100.0)
Platelets: 140 10*3/uL — ABNORMAL LOW (ref 150–400)
RBC: 2.59 MIL/uL — ABNORMAL LOW (ref 3.87–5.11)
RDW: 18.4 % — ABNORMAL HIGH (ref 11.5–15.5)
WBC: 14.5 10*3/uL — ABNORMAL HIGH (ref 4.0–10.5)
nRBC: 0.8 % — ABNORMAL HIGH (ref 0.0–0.2)

## 2021-02-16 LAB — HEMOGLOBIN AND HEMATOCRIT, BLOOD
HCT: 27.7 % — ABNORMAL LOW (ref 36.0–46.0)
Hemoglobin: 9.3 g/dL — ABNORMAL LOW (ref 12.0–15.0)

## 2021-02-16 LAB — PREPARE RBC (CROSSMATCH)

## 2021-02-16 MED ORDER — MIDODRINE HCL 5 MG PO TABS
10.0000 mg | ORAL_TABLET | Freq: Three times a day (TID) | ORAL | Status: DC
  Administered 2021-02-16 – 2021-02-20 (×12): 10 mg via ORAL
  Filled 2021-02-16 (×13): qty 2

## 2021-02-16 MED ORDER — SODIUM CHLORIDE 0.9% IV SOLUTION: Freq: Once | INTRAVENOUS | Status: AC

## 2021-02-16 NOTE — Telephone Encounter (Signed)
Mr Waskey called asking for return call regarding the fact the patient is getting her third unit of blood today and that the endoscopy did not show any reason for her losing blood. He is asking if her blood loss could be from her Myeloma. Please return his call [phone]

## 2021-02-16 NOTE — Progress Notes (Signed)
PROGRESS NOTE    Heather Peterson  NOM:[id number] DOB: Jun 04, 1940 DOA: 02/12/2021 PCP: [name], MD    Brief Narrative:  80 y.o. 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  Patient states that she fell on the morning of her admission but denies hitting her head or any loss of consciousness.  Patient underwent upper EGD on 10/6.  Multiple nonbleeding angioectasias treated with argon beam coagulation.  Esophageal plaques concerning for candidiasis were found.  Brushings performed.  No signs of active bleed.  GI recommends continue present medications and PPI and advance diet as tolerated.    Assessment & Plan:   Principal Problem:   Symptomatic anemia Active Problems:   CKD stage 4 due to type 2 diabetes mellitus (HCC)   Chronic systolic CHF (congestive heart failure) (HCC)   ICD (implantable cardioverter-defibrillator) in place   Anemia in chronic kidney disease (CKD)   Multiple myeloma (HCC)   Hyperkalemia   Chronic respiratory failure (HCC)   Leukocytosis   Lactic acid acidosis  acute on chronic anemia  Hx of AVM treated in 2021 --Hgb appeared to be gradually trending down, 6.0 on presentation. --likely due to upper GI bleed, since BUN 109 markedly elevated.   --s/p 1u pRBC with appropriate rise to 7.1 -Hemoglobin 6.7 on 10/5 -Improved to 7.5 after transfusion -EGD on 10/6.  No active bleeding.  Nonbleeding ulcers treated with APC -Hemoglobin 7.2 on 10/7 Plan: Continue PPI Transfuse 1 unit PRBC Okay for diet as tolerated   Diabetes mellitus with complications of stage IV chronic kidney  disease --No hypoglycemics listed on home med --BG's have been elevated  Plan: -- Moderate sliding scale 3 times daily   AKI on CKD4 --Cr 2.0 on presentation.  Improved to 1.39 with IVF. --torsemide and spironolactone held on presentation. Plan: No further IVF Hold Demadex and Aldactone for 1 additional day due to hypotension   Hyperkalemia, resolved Probably medication induced. Without EKG changes --s/p dextrose and insulin -- Hold Aldactone   History of chronic systolic heart failure Last known LVEF of 25% from February, 2022 --not fluid overloaded --continue to hold torsemide and spironolactone due to AKI and hypotension   Hypotension --pt has midodrine and torsemide and spironolactone on home med list Plan: -- Increase midodrine to 10 mg 3 times daily scheduled --continue to hold torsemide and spironolactone    History of multiple myeloma On chemotherapy Follow-up with oncology as an outpatient   Sepsis, ruled out --Leukocytosis likely reactive.  No fever.  Procal 0.17.  No source of infection currently. --Patient started on broad-spectrum antibiotic therapy in the ER with vancomycin, cefepime and Flagyl -- No further antibiotics.  Low suspicion for infection   DVT prophylaxis: SCD Code Status: Full Family Communication: Linde Gillis [phone] on 10/7 Disposition Plan: Status is: Inpatient  Remains inpatient appropriate because:Inpatient level of care appropriate due to severity of illness  Dispo: The patient is from: Home              Anticipated d/c is to: Home              Patient currently is not medically stable to d/c.   Difficult to  place patient No       Level of care: Med-Surg  Consultants:  GI  Procedures:  EGD 10/6  Antimicrobials: None   Subjective: Patient seen and examined.  More lethargic this morning.  Objective: Vitals:   02/15/21 1545 02/15/21 2029 02/16/21 0412 02/16/21 0753  BP: (!) 92/50 (!) 83/51 (!) 83/54 (!)  91/55  Pulse: 74 73 73 78  Resp: 15 15 15 18   Temp:  98.2 F (36.8 C) 98.6 F (37 C) 98.9 F (37.2 C)  TempSrc:  Oral    SpO2:  100% 100% 100%  Weight:      Height:        Intake/Output Summary (Last 24 hours) at 02/16/2021 1147 Last data filed at 02/16/2021 1000 Gross per 24 hour  Intake 0 ml  Output --  Net 0 ml   Filed Weights   02/13/21 1534 02/15/21 1002  Weight: 74.1 kg 68.2 kg    Examination:  General exam: No acute distress.  Lethargic Respiratory system: Lungs clear.  Normal work of breathing.  2 L Cardiovascular system: S1-S2, RRR, no murmurs, trace pedal edema Gastrointestinal system: Obese, NT/ND, normal bowel sounds Central nervous system: Alert and oriented. No focal neurological deficits. Extremities: Symmetric 5 x 5 power. Skin: No rashes, lesions or ulcers Psychiatry: Judgement and insight appear normal. Mood & affect appropriate.     Data Reviewed: I have personally reviewed following labs and imaging studies  CBC: Recent Labs  Lab 02/12/21 0648 02/13/21 0655 02/14/21 0352 02/15/21 0229 02/16/21 0609  WBC 23.7* 20.0* 18.1* 18.7*  19.1* 14.5*  NEUTROABS 18.9*  --   --  14.8*  --   HGB 6.0* 7.1* 6.7* 7.6*  7.5* 7.2*  HCT 19.5* 21.4* 21.9* 22.8*  22.9* 22.2*  MCV 84.1 85.3 93.6 85.4  86.7 85.7  PLT 111* 82* 70* 118*  112* 275*   Basic Metabolic Panel: Recent Labs  Lab 02/12/21 2021 02/13/21 0655 02/14/21 0352 02/15/21 0229 02/16/21 0609  NA 136 138 139 137 131*  K 5.1 5.1 4.3 4.6 4.3  CL 106 106 109 107 100  CO2 21* 23 23 25 26   GLUCOSE 204* 207* 183* 121* 128*  BUN 109* 86* 65* 55* 36*  CREATININE 1.48* 1.39* 1.06* 1.06* 0.93  CALCIUM 8.7* 8.9 8.7* 8.6* 8.4*  MG  --   --  2.5* 2.5* 2.4   GFR: Estimated Creatinine Clearance: 45.5 mL/min (by C-G formula based on SCr of 0.93 mg/dL). Liver Function Tests: Recent Labs  Lab 02/12/21 0617  AST 23  ALT 19  ALKPHOS 68  BILITOT 0.8  PROT 6.6  ALBUMIN 3.8   No results for  input(s): LIPASE, AMYLASE in the last 168 hours. No results for input(s): AMMONIA in the last 168 hours. Coagulation Profile: Recent Labs  Lab 02/12/21 0803  INR 1.2   Cardiac Enzymes: No results for input(s): CKTOTAL, CKMB, CKMBINDEX, TROPONINI in the last 168 hours. BNP (last 3 results) No results for input(s): PROBNP in the last 8760 hours. HbA1C: Recent Labs    02/14/21 0352  HGBA1C 7.1*   CBG: Recent Labs  Lab 02/15/21 0752 02/15/21 1153 02/15/21 1700 02/15/21 2101 02/16/21 0754  GLUCAP 127* 160* 214* 119* 126*   Lipid Profile: No results for input(s): CHOL, HDL, LDLCALC, TRIG, CHOLHDL, LDLDIRECT in the last 72 hours. Thyroid Function Tests: No results for input(s): TSH, T4TOTAL, FREET4, T3FREE, THYROIDAB in the last 72 hours. Anemia Panel: No results for input(s): VITAMINB12, FOLATE, FERRITIN, TIBC, IRON, RETICCTPCT  in the last 72 hours.  Sepsis Labs: Recent Labs  Lab 02/12/21 0617 02/12/21 0950 02/12/21 1606 02/12/21 2021 02/13/21 0725 02/15/21 0229  PROCALCITON  --   --   --   --  0.17 <0.10  LATICACIDVEN 2.9* 2.6* 2.8* 2.1*  --   --     Recent Results (from the past 240 hour(s))  Blood Culture (routine x 2)     Status: None (Preliminary result)   Collection Time: 02/12/21  6:48 AM   Specimen: BLOOD  Result Value Ref Range Status   Specimen Description BLOOD LEFT ARM  Final   Special Requests   Final    BOTTLES DRAWN AEROBIC AND ANAEROBIC Blood Culture adequate volume   Culture   Final    NO GROWTH 4 DAYS Performed at [facility], [address], [place], [place] [address]    Report Status PENDING  Incomplete  Blood Culture (routine x 2)     Status: None (Preliminary result)   Collection Time: 02/12/21  6:48 AM   Specimen: BLOOD LEFT HAND  Result Value Ref Range Status   Specimen Description BLOOD LEFT HAND  Final   Special Requests   Final    BOTTLES DRAWN AEROBIC ONLY Blood Culture adequate volume   Culture   Final    NO GROWTH  4 DAYS Performed at [facility], [address] [place],  [address]    Report Status PENDING  Incomplete  Resp Panel by RT-PCR (Flu A&B, Covid) Nasopharyngeal Swab     Status: None   Collection Time: 02/12/21  7:20 AM   Specimen: Nasopharyngeal Swab; Nasopharyngeal(NP) swabs in vial transport medium  Result Value Ref Range Status   SARS Coronavirus 2 by RT PCR NEGATIVE NEGATIVE Final    Comment: (NOTE) SARS-CoV-2 target nucleic acids are NOT DETECTED.  The SARS-CoV-2 RNA is generally detectable in upper respiratory specimens during the acute phase of infection. The lowest concentration of SARS-CoV-2 viral copies this assay can detect is 138 copies/mL. A negative result does not preclude SARS-Cov-2 infection and should not be used as the sole basis for treatment or other patient management decisions. A negative result may occur with  improper specimen collection/handling, submission of specimen other than nasopharyngeal swab, presence of viral mutation(s) within the areas targeted by this assay, and inadequate number of viral copies(<138 copies/mL). A negative result must be combined with clinical observations, patient history, and epidemiological information. The expected result is Negative.  Fact Sheet for Patients:  EntrepreneurPulse.com.au  Fact Sheet for Healthcare Providers:  IncredibleEmployment.be  This test is no t yet approved or cleared by the [place] FDA and  has been authorized for detection and/or diagnosis of SARS-CoV-2 by FDA under an Emergency Use Authorization (EUA). This EUA will remain  in effect (meaning this test can be used) for the duration of the COVID-19 declaration under Section 564(b)(1) of the Act, 21 U.S.C.section 360bbb-3(b)(1), unless the authorization is terminated  or revoked sooner.       Influenza A by PCR NEGATIVE NEGATIVE Final   Influenza B by PCR NEGATIVE NEGATIVE Final     Comment: (NOTE) The Xpert Xpress SARS-CoV-2/FLU/RSV plus assay is intended as an aid in the diagnosis of influenza from Nasopharyngeal swab specimens and should not be used as a sole basis for treatment. Nasal washings and aspirates are unacceptable for Xpert Xpress SARS-CoV-2/FLU/RSV testing.  Fact Sheet for Patients: EntrepreneurPulse.com.au  Fact Sheet for Healthcare Providers: IncredibleEmployment.be  This test is not yet approved  or cleared by the [place] and has been authorized for detection and/or diagnosis of SARS-CoV-2 by FDA under an Emergency Use Authorization (EUA). This EUA will remain in effect (meaning this test can be used) for the duration of the COVID-19 declaration under Section 564(b)(1) of the Act, 21 U.S.C. section 360bbb-3(b)(1), unless the authorization is terminated or revoked.  Performed at [facility], [address] [place], [place] [address]   Urine Culture     Status: Abnormal   Collection Time: 02/12/21  1:30 PM   Specimen: Urine, Random  Result Value Ref Range Status   Specimen Description   Final    URINE, RANDOM Performed at [facility], [address], [place], [place] [address]    Special Requests   Final    NONE Performed at [facility], [address] [place], [place] [address]    Culture (A)  Final    <10,000 COLONIES/mL INSIGNIFICANT GROWTH Performed at [facility], [place] [address], [place], [place] [address]    Report Status 02/13/2021 FINAL  Final  KOH prep     Status: None   Collection Time: 02/15/21 10:54 AM   Specimen: Bronchial Brush  Result Value Ref Range Status   Specimen Description BRONCHIAL BRUSHING  Final   Special Requests NONE  Final   KOH Prep   Final    BUDDING YEAST SEEN Performed at [facility], [address], [place], [place] [address]    Report Status 02/15/2021 FINAL  Final         Radiology  Studies: No results found.      Scheduled Meds:  sodium chloride   Intravenous Once   sodium chloride   Intravenous Once   acidophilus  1 capsule Oral Daily   allopurinol  200 mg Oral Daily   aspirin EC  81 mg Oral QHS   docusate sodium  100 mg Oral BID   FLUoxetine  10 mg Oral Daily   gabapentin  100 mg Oral Daily   And   gabapentin  700 mg Oral QHS   insulin aspart  0-15 Units Subcutaneous TID WC   levothyroxine  50 mcg Oral Q0600   levothyroxine  75 mcg Oral Q M,W,F   loratadine  10 mg Oral Daily   magnesium oxide  200 mg Oral Daily   mexiletine  200 mg Oral Q12H   midodrine  10 mg Oral TID   nystatin  5 mL Oral QID   pantoprazole (PROTONIX) IV  40 mg Intravenous Q12H   polyethylene glycol  17 g Oral Daily   Ensure Max Protein  11 oz Oral BID BM   simvastatin  20 mg Oral QHS   traZODone  50 mg Oral QHS   Continuous Infusions:   LOS: 4 days    Time spent: 25 minutes    Sidney Ace, MD [facility]   If 7PM-7AM, please contact night-coverage  02/16/2021, 11:47 AM

## 2021-02-16 NOTE — TOC Initial Note (Signed)
Transition of Care [facility]) - Initial/Assessment Note    Patient Details  Name: Heather Peterson MRN: [id number] Date of Birth: March 07, 1941  [facility] of [facility]) CM/SW Contact:    Magnus Ivan, LCSW Phone Number: 02/16/2021, 1:04 PM  Clinical Narrative:                CSW met with patient at bedside. Patient tired but agreeable to assessment. Patient lives with spouse who provides transportation. PCP is Dr. Ellison Hughs. [facility]. Patient has a wheelchair, RW, shower chair, bedside commode, and oxygen at home. Patient is active with HH, could not recall agency. Per [facility] it looks like [facility], asked [name] to confirm. TOC to follow.    Expected Discharge Plan: [employer] Barriers to Discharge: Continued Medical Work up   Patient Goals and CMS Choice Patient states their goals for this hospitalization and ongoing recovery are:: home with resumed home health services CMS Medicare.gov Compare Post Acute Care list provided to:: Patient Choice offered to / list presented to : Patient  Expected Discharge Plan and Services Expected Discharge Plan: [place]       Living arrangements for the past 2 months: [place]: [place] Date [place]: 02/16/21   Representative spoke with at [place]: [name]  Prior Living Arrangements/Services Living arrangements for the past 2 months: [place] Lives with:: Spouse Patient language and need for interpreter reviewed:: Yes Do you feel safe going back to the place where you live?: Yes      Need for Family Participation in Patient Care: Yes (Comment) Care giver support system in place?: Yes (comment) Current home services: DME, Home PT Criminal Activity/Legal Involvement Pertinent to Current Situation/Hospitalization: No - Comment as needed  Activities of Daily Living Home Assistive  Devices/Equipment: Gilford Rile (specify type) ADL Screening (condition at time of admission) Patient's cognitive ability adequate to safely complete daily activities?: No Is the patient deaf or have difficulty hearing?: No Does the patient have difficulty seeing, even when wearing glasses/contacts?: No Does the patient have difficulty concentrating, remembering, or making decisions?: No Patient able to express need for assistance with ADLs?: Yes Does the patient have difficulty dressing or bathing?: No Independently performs ADLs?: Yes (appropriate for developmental age) Does the patient have difficulty walking or climbing stairs?: Yes Weakness of Legs: Both Weakness of Arms/Hands: Both  Permission Sought/Granted Permission sought to share information with : Facility Art therapist granted to share information with : Yes, Verbal Permission Granted     Permission granted to share info w AGENCY: [place], DME agencies        Emotional Assessment       Orientation: : Oriented to Self, Oriented to  Time, Oriented to Place, Oriented to Situation Alcohol / Substance Use: Not Applicable Psych Involvement: No (comment)  Admission diagnosis:  Symptomatic anemia [D64.9] Gastrointestinal hemorrhage, unspecified gastrointestinal hemorrhage type [K92.2] Patient Active Problem List   Diagnosis Date Noted   Symptomatic anemia 02/12/2021   Leukocytosis 02/12/2021   Lactic acid acidosis 02/12/2021   Chemotherapy-induced peripheral neuropathy ([place]) 10/06/2020   Hospital discharge follow-up 08/31/2020   Difficulty sleeping 07/25/2020   Difficulty walking 07/25/2020   H/O subarachnoid hemorrhage 07/25/2020   Moderate protein-calorie malnutrition ([place]) 07/04/2020   Hyperkalemia  06/19/2020   Chronic respiratory failure ([place]) 06/19/2020   Hypokalemia    Fall    Troponin I above reference range    Status post hip hemiarthroplasty    Closed nondisplaced fracture of greater trochanter  of right femur ([place]) 03/30/2020   Closed displaced midcervical fracture of right femur ([place]) 03/30/2020   Hip fracture ([place]) 03/28/2020   Multiple myeloma ([place]) 03/01/2020   Goals of care, counseling/discussion 03/01/2020   Osteomyelitis ([place]) 01/20/2020   Gout 10/27/2019   Acute blood loss anemia    Gastrointestinal hemorrhage    UGI bleed 09/04/2019   Drug-induced constipation 07/26/2019   Insomnia due to medical condition 07/26/2019   Benign hypertensive kidney disease with chronic kidney disease 07/20/2019   Proteinuria 07/20/2019   Hyposmolality and/or hyponatremia 07/20/2019   Dehydration    Anemia in chronic kidney disease (CKD) 07/04/2019   Stage 3b chronic kidney disease ([place]) 07/04/2019   Chronic respiratory failure with hypoxia ([place]) 07/03/2019   Hyponatremia 07/03/2019   Prolonged Q-T interval on ECG    Weakness    Anemia    MDD (major depressive disorder), recurrent episode, 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   Tobacco use disorder 12/14/2018   At risk for prolonged QT interval syndrome 12/14/2018   Hypothyroidism, acquired, 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   Pedal edema 02/19/2018   SOB (shortness of breath) on exertion 02/19/2018   Left main coronary artery disease 03/07/2017   Drug-induced torsades de pointes 03/06/2017   Ventricular tachycardia 03/05/2017   Defibrillator discharge 81/15/7262   Chronic systolic CHF (congestive heart failure) ([place]) 03/04/2017   COPD (chronic obstructive pulmonary disease) ([place]) 02/10/2017   Diabetes mellitus type 2, uncomplicated ([place]) 03/55/9741   H/O ventricular fibrillation 02/10/2017   Hyperlipidemia 02/10/2017   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   Ischemic cardiomyopathy 02/10/2017   Myocardial infarction ([place]) 02/10/2017   Moderate mitral regurgitation 02/10/2017   Syncope 02/04/2017   CAD (coronary artery disease) 02/04/2017   HTN (hypertension) 02/04/2017   CKD stage 4 due to type 2 diabetes mellitus ([place])  02/04/2017   PCP:  [name], MD Pharmacy:   [facility] [phone] [facility], [place] AT [place] [address] Phone: [phone] Fax: [phone]  [facility], [place] - [address] [place] [address] Phone: [phone] Fax: [phone]     Social Determinants of Health (SDOH) Interventions    Readmission Risk Interventions Readmission Risk Prevention Plan 02/16/2021 09/06/2019  Transportation Screening Complete Complete  Medication Review (RN Care Manager) Complete Complete  PCP or Specialist appointment within 3-5 days of discharge Complete Complete  HRI or Home Care Consult Complete Complete  SW Recovery Care/Counseling Consult Complete Complete  Palliative Care Screening Not Applicable Not Applicable  Skilled Nursing Facility Complete Not Applicable  Some recent data might be hidden

## 2021-02-16 NOTE — Consult Note (Signed)
[place]  [phone] [phone]  ID: Heather Peterson OB: 05/15/1940  MR#: [id number]  XBD#:[id number]  [name], MD as PCP - General (Family Medicine) Lloyd Huger, MD as Consulting Physician (Oncology)  CHIEF COMPLAINT: Multiple myeloma, acute on chronic anemia.  INTERVAL HISTORY: Patient is a 80 year old female actively receiving chemotherapy for multiple myeloma who had a recent fall.  Upon admission to the hospital she was noted to have a significantly reduced hemoglobin.  Hemoglobin is now improved to 9.3 with at least 3 units of packed red blood cells.  Patient continues to feel weak and fatigued.  She also describes symptoms of orthostatic hypotension.  When she sits on the side of the bed, she becomes dizzy.  She has no other neurologic complaints.  She has a poor appetite.  She denies any fevers.  She has no chest pain, shortness of breath, cough, or hemoptysis.  She denies any nausea, vomiting, constipation, or diarrhea.  She reports melena prior to admission, but none since.  She has no urinary complaints.  Patient feels generally terrible, but offers no further specific complaints today.  REVIEW OF SYSTEMS:   Review of Systems  Constitutional:  Positive for malaise/fatigue. Negative for fever and weight loss.  Respiratory: Negative.  Negative for cough, hemoptysis and shortness of breath.   Cardiovascular: Negative.  Negative for chest pain and leg swelling.  Gastrointestinal:  Positive for melena. Negative for abdominal pain and blood in stool.  Genitourinary: Negative.  Negative for hematuria.  Musculoskeletal: Negative.  Negative for back pain.  Skin: Negative.  Negative for rash.  Neurological:  Positive for dizziness and weakness. Negative for focal weakness and headaches.  Psychiatric/Behavioral: Negative.  The patient is not nervous/anxious.    As per HPI. Otherwise, a complete review of  systems is negative.  PAST MEDICAL HISTORY: Past Medical History:  Diagnosis Date   Anxiety    Chronic combined systolic and diastolic CHF (congestive heart failure) (HCC)    Chronic kidney disease    Coronary artery disease    Depression    Diabetes mellitus without complication (HCC)    Diabetes mellitus, type II ([place])    Hypertension    MI (myocardial infarction) ([place])    x 5   Pacemaker    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[place])    Prolonged Q-T interval on ECG    Thyroid disease     PAST SURGICAL HISTORY: Past Surgical History:  Procedure Laterality Date   CENTRAL LINE INSERTION  03/11/2017   Procedure: CENTRAL LINE INSERTION;  Surgeon: Leonie Man, MD;  Location: [facility];  Service: Cardiovascular;;   CHOLECYSTECTOMY     COLONOSCOPY WITH PROPOFOL N/A 09/01/2019   Procedure: COLONOSCOPY WITH PROPOFOL;  Surgeon: Toledo, Benay Pike, MD;  Location: [facility]OSCOPY;  Service: Gastroenterology;  Laterality: N/A;   CORONARY STENT INTERVENTION W/IMPELLA N/A 03/11/2017   Procedure: Coronary Stent Intervention w/Impella;  Surgeon: Leonie Man, MD;  Location: [place] [facility];  Service: Cardiovascular;  Laterality: N/A;   ESOPHAGOGASTRODUODENOSCOPY (EGD) WITH PROPOFOL N/A 09/01/2019   Procedure: ESOPHAGOGASTRODUODENOSCOPY (EGD) WITH PROPOFOL;  Surgeon: Toledo, Benay Pike, MD;  Location: [facility] ENDOSCOPY;  Service: Gastroenterology;  Laterality: N/A;   ESOPHAGOGASTRODUODENOSCOPY (EGD) WITH PROPOFOL N/A 09/08/2019   Procedure: ESOPHAGOGASTRODUODENOSCOPY (EGD) WITH PROPOFOL;  Surgeon: [name], MD;  Location: [facility] ENDOSCOPY;  Service: Gastroenterology;  Laterality: N/A;   ESOPHAGOGASTRODUODENOSCOPY (EGD) WITH PROPOFOL N/A 02/15/2021   Procedure: ESOPHAGOGASTRODUODENOSCOPY (EGD)  WITH PROPOFOL;  Surgeon: [name], MD;  Location: [facility] ENDOSCOPY;  Service: Gastroenterology;  Laterality: N/A;   EYE SURGERY     HIP ARTHROPLASTY Right 03/29/2020   Procedure: ARTHROPLASTY  BIPOLAR HIP (HEMIARTHROPLASTY);  Surgeon: Corky Mull, MD;  Location: [facility] ORS;  Service: Orthopedics;  Laterality: Right;   INTRAVASCULAR PRESSURE WIRE/FFR STUDY N/A 03/11/2017   Procedure: INTRAVASCULAR PRESSURE WIRE/FFR STUDY;  Surgeon: Leonie Man, MD;  Location: [place] [facility];  Service: Cardiovascular;  Laterality: N/A;   INTRAVASCULAR ULTRASOUND/IVUS N/A 03/11/2017   Procedure: Intravascular Ultrasound/IVUS;  Surgeon: Leonie Man, MD;  Location: [place] [facility];  Service: Cardiovascular;  Laterality: N/A;   LEFT HEART CATH AND CORONARY ANGIOGRAPHY N/A 03/05/2017   Procedure: LEFT HEART CATH AND CORONARY ANGIOGRAPHY;  Surgeon: Isaias Cowman, MD;  Location: [place] [facility];  Service: Cardiovascular;  Laterality: N/A;   PACEMAKER IMPLANT     pacemaker/defibrillator Left     FAMILY HISTORY: Family History  Problem Relation Age of Onset   Hypertension Father    Heart attack Father    Depression Sister    Depression Brother    Depression Brother     ADVANCED DIRECTIVES (Y/N):  _0 @  HEALTH MAINTENANCE: Social History   Tobacco Use   Smoking status: Every Day    Packs/day: 0.25    Types: E-cigarettes, Cigarettes   Smokeless tobacco: Never  Vaping Use   Vaping Use: Former  Substance Use Topics   Alcohol use: Not Currently    Comment: occasionally   Drug use: Yes    Comment: prescribed pain meds     Colonoscopy:  PAP:  Bone density:  Lipid panel:  Allergies  Allergen Reactions   Celebrex [Celecoxib] Anaphylaxis   Glipizide Anaphylaxis   Levaquin [Levofloxacin In D5w] Other (See Comments)    Heart arrhthymias   Levofloxacin Other (See Comments) and Palpitations    ICD fired   Lisinopril Swelling    Lip and facial swelling   Sulfa Antibiotics Other (See Comments) and Anaphylaxis    Reaction: unknown   Metformin Other (See Comments)    Gi tolerance    Penicillins Rash and Other (See Comments)    Has patient had a PCN reaction  causing immediate rash, facial/tongue/throat swelling, SOB or lightheadedness with hypotension: Unknown Has patient had a PCN reaction causing severe rash involving mucus membranes or skin necrosis: No Has patient had a PCN reaction that required hospitalization: No Has patient had a PCN reaction occurring within the last 10 years: No If all of the above answers are "NO", then may proceed with Cephalosporin use.     Current Facility-Administered Medications  Medication Dose Route Frequency Provider Last Rate Last Admin   0.9 %  sodium chloride infusion (Manually program via Guardrails IV Fluids)   Intravenous Once Ralene Muskrat B, MD       acidophilus (RISAQUAD) capsule 1 capsule  1 capsule Oral Daily Agbata, Tochukwu, MD   1 capsule at 02/16/21 1013   allopurinol (ZYLOPRIM) tablet 200 mg  200 mg Oral Daily Agbata, Tochukwu, MD   200 mg at 02/16/21 1013   aspirin EC tablet 81 mg  81 mg Oral QHS Agbata, Tochukwu, MD   81 mg at 02/15/21 2226   docusate sodium (COLACE) capsule 100 mg  100 mg Oral BID Agbata, Tochukwu, MD   100 mg at 02/16/21 1013   FLUoxetine (PROZAC) capsule 10 mg  10 mg Oral Daily Agbata, Tochukwu, MD   10 mg at  02/16/21 1013   gabapentin (NEURONTIN) capsule 100 mg  100 mg Oral Daily Agbata, Tochukwu, MD   100 mg at 02/16/21 1013   And   gabapentin (NEURONTIN) capsule 700 mg  700 mg Oral QHS Agbata, Tochukwu, MD   700 mg at 02/15/21 2225   insulin aspart (novoLOG) injection 0-15 Units  0-15 Units Subcutaneous TID WC Enzo Bi, MD   2 Units at 02/16/21 5643   levothyroxine (SYNTHROID) tablet 50 mcg  50 mcg Oral Q0600 Agbata, Tochukwu, MD   50 mcg at 02/16/21 0646   levothyroxine (SYNTHROID) tablet 75 mcg  75 mcg Oral Q M,W,F Oswald Hillock, RPH   75 mcg at 02/16/21 0647   loratadine (CLARITIN) tablet 10 mg  10 mg Oral Daily Agbata, Tochukwu, MD   10 mg at 02/16/21 1013   magic mouthwash  15 mL Oral TID PRN Ralene Muskrat B, MD   15 mL at 02/16/21 0648   magnesium oxide  (MAG-OX) tablet 200 mg  200 mg Oral Daily Agbata, Tochukwu, MD   200 mg at 02/16/21 1013   mexiletine (MEXITIL) capsule 200 mg  200 mg Oral Q12H Agbata, Tochukwu, MD   200 mg at 02/16/21 1013   midodrine (PROAMATINE) tablet 10 mg  10 mg Oral TID Ralene Muskrat B, MD   10 mg at 02/16/21 1602   nystatin (MYCOSTATIN) 100000 UNIT/ML suspension 500,000 Units  5 mL Oral QID Ralene Muskrat B, MD   500,000 Units at 02/16/21 1447   pantoprazole (PROTONIX) injection 40 mg  40 mg Intravenous Q12H Enzo Bi, MD   40 mg at 02/16/21 1012   polyethylene glycol (MIRALAX / GLYCOLAX) packet 17 g  17 g Oral Daily Agbata, Tochukwu, MD   17 g at 02/16/21 1014   protein supplement (ENSURE MAX) liquid  11 oz Oral BID BM Agbata, Tochukwu, MD   11 oz at 02/16/21 1359   simvastatin (ZOCOR) tablet 20 mg  20 mg Oral QHS Agbata, Tochukwu, MD   20 mg at 02/15/21 2224   traZODone (DESYREL) tablet 50 mg  50 mg Oral QHS Agbata, Tochukwu, MD   50 mg at 02/15/21 2224   zolpidem (AMBIEN) tablet 5 mg  5 mg Oral QHS PRN Agbata, Tochukwu, MD   5 mg at 02/15/21 2309    OBJECTIVE: Vitals:   02/16/21 1407 02/16/21 1410  BP:  (!) 89/57  Pulse:  77  Resp: 14 14  Temp:  98 F (36.7 C)  SpO2:  100%     Body mass index is 26.63 kg/m.    ECOG FS:3 - Symptomatic, >50% confined to bed  General: Well-developed, well-nourished, no acute distress. Eyes: Pink conjunctiva, anicteric sclera. HEENT: Normocephalic, moist mucous membranes. Lungs: No audible wheezing or coughing. Heart: Regular rate and rhythm. Abdomen: Soft, nontender, no obvious distention. Musculoskeletal: No edema, cyanosis, or clubbing. Neuro: Alert, answering all questions appropriately. Cranial nerves grossly intact. Skin: No rashes or petechiae noted. Psych: Normal affect. Lymphatics: No cervical, calvicular, axillary or inguinal LAD.   LAB RESULTS:  Lab Results  Component Value Date   NA 131 (L) 02/16/2021   K 4.3 02/16/2021   CL 100 02/16/2021   CO2  26 02/16/2021   GLUCOSE 128 (H) 02/16/2021   BUN 36 (H) 02/16/2021   CREATININE 0.93 02/16/2021   CALCIUM 8.4 (L) 02/16/2021   PROT 6.6 02/12/2021   ALBUMIN 3.8 02/12/2021   AST 23 02/12/2021   ALT 19 02/12/2021   ALKPHOS 68 02/12/2021   BILITOT  0.8 02/12/2021   GFRNONAA >60 02/16/2021   GFRAA 30 (L) 09/09/2019    Lab Results  Component Value Date   WBC 14.5 (H) 02/16/2021   NEUTROABS 14.8 (H) 02/15/2021   HGB 7.2 (L) 02/16/2021   HCT 22.2 (L) 02/16/2021   MCV 85.7 02/16/2021   PLT 140 (L) 02/16/2021     STUDIES: CT Abdomen Pelvis Wo Contrast  Result Date: 02/12/2021 CLINICAL DATA:  Generalized weakness and pain. History of multiple myeloma. Recent fall. EXAM: CT ABDOMEN AND PELVIS WITHOUT CONTRAST TECHNIQUE: Multidetector CT imaging of the abdomen and pelvis was performed following the standard protocol without IV contrast. COMPARISON:  CT abdomen and pelvis 06/18/2020 FINDINGS: Lower chest: Stable cardiomegaly. Hypodense blood pool, suggestive of edema anemia. Partially visualized pacemaker lead in the right ventricle. Coronary artery stent. No abnormal pericardial fluid. Scattered atherosclerosis of the descending thoracic aorta. Visualized lung bases are clear. Hepatobiliary: Mild surface nodularity. No focal liver abnormality is seen. Status post cholecystectomy. No biliary dilatation. Pancreas: Diffuse mild parenchymal atrophy and fatty infiltration. The main pancreatic duct is not dilated. No peripancreatic fluid collections. Spleen: Normal in size. A few calcifications are noted in the lateral aspect of the spleen, similar to prior studies. Adrenals/Urinary Tract: Stable low-attenuation (Hounsfield units -17 and -19) bilateral adrenal masses consistent with adrenal adenomas, unchanged compared to 03/02/2019. Stable cortical scarring at the posterior upper poles of the kidneys. No hydronephrosis. Bilateral nonobstructive nephrolithiasis also unchanged compared to 03/02/2019.  Stomach/Bowel: Stomach is within normal limits. The appendix is not directly visualized, however no pericecal inflammatory changes. Diffuse diverticulosis involving the descending and sigmoid colon. No evidence of bowel wall thickening, distention, or inflammatory changes. Vascular/Lymphatic: Severe calcific atherosclerosis involving the abdominal aorta and common iliac arteries. Severe focal narrowing in the infrarenal abdominal aorta as well as the proximal left common iliac artery appear similar to 03/01/2021 the evaluation is limited due to lack of intravenous contrast. Scattered calcific atherosclerosis of the external iliac arteries. No lymphadenopathy in the abdomen or pelvis. Reproductive: Uterus and bilateral adnexa are unremarkable. Other: No abdominal wall hernia or abnormality. No abdominopelvic ascites. Musculoskeletal: Right hip prosthesis is intact. Multilevel degenerative disc disease in the visualized distal thoracic and lumbar spine, most severe at L4-L5 with vacuum disc phenomenon. No new acute or significant osseous finding. IMPRESSION: 1. No acute abnormality in the abdomen or pelvis. 2. Mild cirrhotic changes of the liver, likely secondary to CHF. 3. Diverticulosis of the descending and sigmoid colon without findings of diverticulitis. 4. Stable bilateral adrenal adenomas. 5. Aortic Atherosclerosis (ICD10-I70.0), severe. Electronically Signed   By: Ileana Roup M.D.   On: 02/12/2021 09:50   CT Head Wo Contrast  Result Date: 02/12/2021 CLINICAL DATA:  Generalized weakness and pain. Patient receives injections for multiple myeloma. There are multiple skin tears and bruises to both arms. Patient fell Saturday morning due to weakness. EXAM: CT HEAD WITHOUT CONTRAST CT CERVICAL SPINE WITHOUT CONTRAST TECHNIQUE: Multidetector CT imaging of the head and cervical spine was performed following the standard protocol without intravenous contrast. Multiplanar CT image reconstructions of the cervical  spine were also generated. COMPARISON:  Prior exams, most recent head CT, 12/15/2020. FINDINGS: CT HEAD FINDINGS Brain: No evidence of acute infarction, hemorrhage, hydrocephalus, extra-axial collection or mass lesion/mass effect. Encephalomalacia of the right frontal lobe consistent with an old infarct. Patchy bilateral white matter hypoattenuation noted consistent with moderate chronic microvascular ischemic change. Ventricular and sulcal enlargement also noted consistent with mild diffuse atrophy. These findings are stable. Vascular:  No hyperdense vessel or unexpected calcification. Skull: Normal. Negative for fracture or focal lesion. Sinuses/Orbits: Visualized globes and orbits are unremarkable. Visualized sinuses are clear. Other: None. CT CERVICAL SPINE FINDINGS Alignment: Slight reversal of the normal cervical lordosis. No spondylolisthesis. Skull base and vertebrae: No acute fracture. No primary bone lesion or focal pathologic process. Soft tissues and spinal canal: No prevertebral fluid or swelling. No visible canal hematoma. Disc levels: Mild loss of disc height at C3-C4. Remaining disc spaces are relatively well preserved. Facet degenerative change noted, most evident on the right at C2-C3 and C3-C4. No significant disc bulging or convincing disc herniation. Upper chest: No acute or significant abnormality. Other: None. IMPRESSION: HEAD CT 1. No acute intracranial abnormalities. 2. Old right frontal lobe infarct. Mild atrophy and moderate chronic microvascular ischemic change. CERVICAL CT 1. No fracture or acute finding. Electronically Signed   By: Lajean Manes M.D.   On: 02/12/2021 09:32   CT Cervical Spine Wo Contrast  Result Date: 02/12/2021 CLINICAL DATA:  Generalized weakness and pain. Patient receives injections for multiple myeloma. There are multiple skin tears and bruises to both arms. Patient fell Saturday morning due to weakness. EXAM: CT HEAD WITHOUT CONTRAST CT CERVICAL SPINE WITHOUT  CONTRAST TECHNIQUE: Multidetector CT imaging of the head and cervical spine was performed following the standard protocol without intravenous contrast. Multiplanar CT image reconstructions of the cervical spine were also generated. COMPARISON:  Prior exams, most recent head CT, 12/15/2020. FINDINGS: CT HEAD FINDINGS Brain: No evidence of acute infarction, hemorrhage, hydrocephalus, extra-axial collection or mass lesion/mass effect. Encephalomalacia of the right frontal lobe consistent with an old infarct. Patchy bilateral white matter hypoattenuation noted consistent with moderate chronic microvascular ischemic change. Ventricular and sulcal enlargement also noted consistent with mild diffuse atrophy. These findings are stable. Vascular: No hyperdense vessel or unexpected calcification. Skull: Normal. Negative for fracture or focal lesion. Sinuses/Orbits: Visualized globes and orbits are unremarkable. Visualized sinuses are clear. Other: None. CT CERVICAL SPINE FINDINGS Alignment: Slight reversal of the normal cervical lordosis. No spondylolisthesis. Skull base and vertebrae: No acute fracture. No primary bone lesion or focal pathologic process. Soft tissues and spinal canal: No prevertebral fluid or swelling. No visible canal hematoma. Disc levels: Mild loss of disc height at C3-C4. Remaining disc spaces are relatively well preserved. Facet degenerative change noted, most evident on the right at C2-C3 and C3-C4. No significant disc bulging or convincing disc herniation. Upper chest: No acute or significant abnormality. Other: None. IMPRESSION: HEAD CT 1. No acute intracranial abnormalities. 2. Old right frontal lobe infarct. Mild atrophy and moderate chronic microvascular ischemic change. CERVICAL CT 1. No fracture or acute finding. Electronically Signed   By: Lajean Manes M.D.   On: 02/12/2021 09:32   DG Chest Port 1 View  Result Date: 02/12/2021 CLINICAL DATA:  Questionable sepsis.  Weakness. EXAM: PORTABLE  CHEST 1 VIEW COMPARISON:  06/25/2020 FINDINGS: Dual-chamber ICD leads from the left. Cardiomegaly. Coronary stenting. There is no edema, consolidation, effusion, or pneumothorax. IMPRESSION: No evidence of active disease.  Chronic cardiomegaly. Electronically Signed   By: Jorje Guild M.D.   On: 02/12/2021 06:55    ASSESSMENT: Multiple myeloma, acute on chronic anemia.  PLAN:    1.  Multiple myeloma: Patient last received single agent Velcade on February 09, 2021. Patient's most recent M spike on February 06, 2021 revealed a stable M spike at 0.5.  Her IgA immunoglobulin is trending down to 976.  Will hold further chemotherapy at this time until  patient recovers and her performance status improves.  If patient is discharged, she has been instructed to keep her previously scheduled follow-up in the cancer center on February 20, 2021.  If she remains inpatient, will cancel this appointment and reschedule at a later date. 2.  Acute on chronic anemia: Possibly secondary to GI bleed.  Patient recently underwent EGD.  Appreciate GI input.  She also has poor bone marrow reserve.  She is status post 3 units packed red blood cells with significant improvement of her hemoglobin up to 9.3.  Given patient's underlying cardiac disease, would maintain hemoglobin greater than 8.0. 3.  Thrombocytopenia: Improving, monitor.Marland Kitchen   Appreciate consult, will follow.  Lloyd Huger, MD   02/16/2021 4:17 PM

## 2021-02-16 NOTE — Progress Notes (Signed)
PT Cancellation Note  Patient Details Name: Heather Peterson MRN: [id number] DOB: 1940-05-21   Cancelled Treatment:    Reason Eval/Treat Not Completed: Other (comment). Per RN pt pending transfusion, PT to re-attempt as able. BP at rest 90s/50s.   Lieutenant Diego PT, DPT 9:13 AM,02/16/21

## 2021-02-16 NOTE — Progress Notes (Signed)
Patient continues to be hypotensive; Dr. Priscella Mann notified of hypotension. Will monitor throughout blood transfusion and after; MAP is greater than/equal to 65 at this time. No new orders at this time.

## 2021-02-16 NOTE — Telephone Encounter (Signed)
Patient presented to ED on 10/3 with current staus of ED to Hosp-Admission.

## 2021-02-16 NOTE — Telephone Encounter (Signed)
I was waiting for Dr. Grayland Ormond to respond.  I sent him a secure chat and he has not responded to that.  Faythe Casa, NP 02/16/2021 2:26 PM

## 2021-02-17 DIAGNOSIS — D649 Anemia, unspecified: Secondary | ICD-10-CM | POA: Diagnosis not present

## 2021-02-17 LAB — BPAM RBC
Blood Product Expiration Date: 202210102359
ISSUE DATE / TIME: 202210071337
Unit Type and Rh: 5100

## 2021-02-17 LAB — BASIC METABOLIC PANEL
Anion gap: 7 (ref 5–15)
BUN: 32 mg/dL — ABNORMAL HIGH (ref 8–23)
CO2: 25 mmol/L (ref 22–32)
Calcium: 8.4 mg/dL — ABNORMAL LOW (ref 8.9–10.3)
Chloride: 97 mmol/L — ABNORMAL LOW (ref 98–111)
Creatinine, Ser: 0.95 mg/dL (ref 0.44–1.00)
GFR, Estimated: >60 mL/min (ref >60)
Glucose, Bld: 138 mg/dL — ABNORMAL HIGH (ref 70–99)
Potassium: 4.4 mmol/L (ref 3.5–5.1)
Sodium: 129 mmol/L — ABNORMAL LOW (ref 135–145)

## 2021-02-17 LAB — CBC
HCT: 26.2 % — ABNORMAL LOW (ref 36.0–46.0)
Hemoglobin: 8.9 g/dL — ABNORMAL LOW (ref 12.0–15.0)
MCH: 29.6 pg (ref 26.0–34.0)
MCHC: 34 g/dL (ref 30.0–36.0)
MCV: 87 fL (ref 80.0–100.0)
Platelets: 161 10*3/uL (ref 150–400)
RBC: 3.01 MIL/uL — ABNORMAL LOW (ref 3.87–5.11)
RDW: 17.5 % — ABNORMAL HIGH (ref 11.5–15.5)
WBC: 15.1 10*3/uL — ABNORMAL HIGH (ref 4.0–10.5)
nRBC: 0.9 % — ABNORMAL HIGH (ref 0.0–0.2)

## 2021-02-17 LAB — CULTURE, BLOOD (ROUTINE X 2)
Culture: NO GROWTH
Culture: NO GROWTH
Special Requests: ADEQUATE
Special Requests: ADEQUATE

## 2021-02-17 LAB — MAGNESIUM: Magnesium: 2.2 mg/dL (ref 1.7–2.4)

## 2021-02-17 LAB — TYPE AND SCREEN
ABO/RH(D): B POS
Antibody Screen: NEGATIVE
Unit division: 0

## 2021-02-17 LAB — GLUCOSE, CAPILLARY
Glucose-Capillary: 144 mg/dL — ABNORMAL HIGH (ref 70–99)
Glucose-Capillary: 129 mg/dL — ABNORMAL HIGH (ref 70–99)
Glucose-Capillary: 182 mg/dL — ABNORMAL HIGH (ref 70–99)
Glucose-Capillary: 150 mg/dL — ABNORMAL HIGH (ref 70–99)

## 2021-02-17 NOTE — Progress Notes (Signed)
Physical Therapy Treatment Patient Details Name: Heather Peterson MRN: [id number] DOB: [birth year] Today's Date: 02/17/2021   History of Present Illness presented to ER secondary to progressive weakness; admitted for management of acute/chronic anemia, s/p EGD significant for multiple non-bleeding angioectasias, treated with argon beam coagulation (02/15/21).  No active bleeding noted per report    PT Comments    Pt continues to be limited by low BP, thus session focused on bed level exercises with pt requiring mod/max multimodal cuing for proper technique. Difficult to make d/c recommendations as pt's OOB mobility has been limited by medical issues. Will continue to follow pt acutely to progress mobility as able & update f/u recommendations as needed.    Recommendations for follow up therapy are one component of a multi-disciplinary discharge planning process, led by the attending physician.  Recommendations may be updated based on patient status, additional functional criteria and insurance authorization.  Follow Up Recommendations   (pending additional mobility assessment, pt/family hopeful for d/c home with HHPT f/u)     Equipment Recommendations       Recommendations for Other Services       Precautions / Restrictions Precautions Precautions: Fall;ICD/Pacemaker Restrictions Weight Bearing Restrictions: No     Mobility  Bed Mobility               General bed mobility comments: Pt requires max cuing for hand placement & technique & bed in trendelenburg position to scoot to [facility] with 2 scoots.    Transfers                    Ambulation/Gait                 Stairs             Wheelchair Mobility    Modified Rankin (Stroke Patients Only)       Balance                                            Cognition Arousal/Alertness: Awake/alert Behavior During Therapy: WFL for tasks assessed/performed;Flat affect Overall  Cognitive Status: Within Functional Limits for tasks assessed                                        Exercises General Exercises - Lower Extremity Heel Slides: AROM;AAROM;Strengthening;Both;10 reps;Supine (multimodal cuing for technique) Hip ABduction/ADduction: AAROM;AROM;Strengthening;Both;10 reps;Supine (hip abduction slides x 10, hip adduction pillow squeezes x 10) Straight Leg Raises: AAROM;Strengthening;Both;10 reps;Supine    General Comments General comments (skin integrity, edema, etc.): Pt on 2L/min via nasal cannula, SpO2 100%, BP in RUE supine in bed at beginning of session: 79/53 mmHg (MAP 62)      Pertinent Vitals/Pain      Home Living                      Prior Function            PT Goals (current goals can now be found in the care plan section) Acute Rehab PT Goals Patient Stated Goal: to go home PT Goal Formulation: With patient/family Time For Goal Achievement: 03/01/21 Potential to Achieve Goals: Fair Progress towards PT goals: Progressing toward goals    Frequency    Min 2X/week  PT Plan Current plan remains appropriate    Co-evaluation              AM-PAC PT "6 Clicks" Mobility   Outcome Measure  Help needed turning from your back to your side while in a flat bed without using bedrails?: A Little Help needed moving from lying on your back to sitting on the side of a flat bed without using bedrails?: A Little Help needed moving to and from a bed to a chair (including a wheelchair)?: A Lot Help needed standing up from a chair using your arms (e.g., wheelchair or bedside chair)?: A Lot Help needed to walk in hospital room?: A Lot Help needed climbing 3-5 steps with a railing? : A Lot 6 Click Score: 14    End of Session   Activity Tolerance: Treatment limited secondary to medical complications (Comment) (low BP) Patient left: in bed;with call bell/phone within reach;with bed alarm set (set up with breakfast  tray in chair position in bed) Nurse Communication: Mobility status PT Visit Diagnosis: Muscle weakness (generalized) (M62.81);Difficulty in walking, not elsewhere classified (R26.2);History of falling (Z91.81)     Time: 8264-1583 PT Time Calculation (min) (ACUTE ONLY): 19 min  Charges:  $Therapeutic Activity: 8-22 mins                     Lavone Nian, PT, DPT 02/17/21, 10:16 AM    Waunita Schooner 02/17/2021, 10:15 AM

## 2021-02-17 NOTE — Progress Notes (Signed)
PROGRESS NOTE    Heather Peterson  MCE:[id number] DOB: [birth year] DOA: 02/12/2021 PCP: [name], MD    Brief Narrative:  80 y.o. 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  Patient states that she fell on the morning of her admission but denies hitting her head or any loss of consciousness.  Patient underwent upper EGD on 10/6.  Multiple nonbleeding angioectasias treated with argon beam coagulation.  Esophageal plaques concerning for candidiasis were found.  Brushings performed.  No signs of active bleed.  GI recommends continue present medications and PPI and advance diet as tolerated.    Assessment & Plan:   Principal Problem:   Symptomatic anemia Active Problems:   CKD stage 4 due to type 2 diabetes mellitus (HCC)   Chronic systolic CHF (congestive heart failure) (HCC)   ICD (implantable cardioverter-defibrillator) in place   Anemia in chronic kidney disease (CKD)   Multiple myeloma (HCC)   Hyperkalemia   Chronic respiratory failure (HCC)   Leukocytosis   Lactic acid acidosis  acute on chronic anemia  Hx of AVM treated in 2021 --Hgb appeared to be gradually trending down, 6.0 on presentation. --likely due to upper GI bleed, since BUN 109 markedly elevated.   --s/p 1u pRBC with appropriate rise to 7.1 -Hemoglobin 6.7 on 10/5 -Improved to 7.5 after transfusion -EGD on 10/6.  No active bleeding.  Nonbleeding ulcers treated with APC -Hemoglobin 7.2 on 10/7 -Hemoglobin 8.9 on 10/8 Plan: Continue PPI No further transfusion Goal hemoglobin 8 Okay for diet as tolerated   Diabetes mellitus with  complications of stage IV chronic kidney disease --No hypoglycemics listed on home med --BG's have been elevated  Plan: -- Moderate sliding scale 3 times daily   AKI on CKD4 --Cr 2.0 on presentation.  Improved to 1.39 with IVF. --torsemide and spironolactone held on presentation. Plan: No further IVF Hold Demadex and Aldactone for 1 additional day due to hypotension If maps remain above 65 we will slowly start to reintroduce diuretics on 10/9   Hyperkalemia, resolved Probably medication induced. Without EKG changes --s/p dextrose and insulin -- Hold Aldactone   History of chronic systolic heart failure Last known LVEF of 25% from February, 2022 --not fluid overloaded --continue to hold torsemide and spironolactone due to AKI and hypotension   Hypotension --pt has midodrine and torsemide and spironolactone on home med list Plan: -- Increase midodrine to 10 mg 3 times daily scheduled --continue to hold torsemide and spironolactone    History of multiple myeloma On chemotherapy Follow-up with oncology as an outpatient   Sepsis, ruled out --Leukocytosis likely reactive.  No fever.  Procal 0.17.  No source of infection currently. --Patient started on broad-spectrum antibiotic therapy in the ER with vancomycin, cefepime and Flagyl -- No further antibiotics.  Low suspicion for infection   DVT prophylaxis: SCD Code Status: Full Family Communication: Linde Gillis [phone] on 10/8 Disposition Plan: Status is: Inpatient  Remains inpatient appropriate because:Inpatient level of care appropriate due to severity of illness  Dispo: The patient is from: Home              Anticipated d/c is to: Home  Patient currently is not medically stable to d/c.   Difficult to place patient No       Level of care: Med-Surg  Consultants:  GI  Procedures:  EGD 10/6  Antimicrobials: None   Subjective: Patient seen and examined.  Remains lethargic this  morning  Objective: Vitals:   02/16/21 1712 02/16/21 1936 02/17/21 0447 02/17/21 0835  BP: (!) 91/55 (!) 92/55 92/66 (!) 88/63  Pulse: 76 78 72 71  Resp: 20 18 16 16   Temp: 97.6 F (36.4 C) 98 F (36.7 C) 97.9 F (36.6 C) 97.8 F (36.6 C)  TempSrc: Oral Oral Oral Oral  SpO2: 100% 100% 98% 100%  Weight:      Height:        Intake/Output Summary (Last 24 hours) at 02/17/2021 1032 Last data filed at 02/16/2021 2124 Gross per 24 hour  Intake 500 ml  Output 1150 ml  Net -650 ml   Filed Weights   02/13/21 1534 02/15/21 1002  Weight: 74.1 kg 68.2 kg    Examination:  General exam: No acute distress.  Lethargic Respiratory system: Lungs clear.  Normal work breathing.  2 L Cardiovascular system: S1-S2, RRR, no murmurs, trace pedal edema Gastrointestinal system: Obese, NT/ND, normal bowel sounds Central nervous system: Lethargic but alert and oriented.  No focal deficits Extremities: Symmetric 5 x 5 power. Skin: No rashes, lesions or ulcers Psychiatry: Judgement and insight appear normal. Mood & affect appropriate.     Data Reviewed: I have personally reviewed following labs and imaging studies  CBC: Recent Labs  Lab 02/12/21 0648 02/13/21 0655 02/14/21 0352 02/15/21 0229 02/16/21 0609 02/16/21 1701 02/17/21 0519  WBC 23.7* 20.0* 18.1* 18.7*  19.1* 14.5*  --  15.1*  NEUTROABS 18.9*  --   --  14.8*  --   --   --   HGB 6.0* 7.1* 6.7* 7.6*  7.5* 7.2* 9.3* 8.9*  HCT 19.5* 21.4* 21.9* 22.8*  22.9* 22.2* 27.7* 26.2*  MCV 84.1 85.3 93.6 85.4  86.7 85.7  --  87.0  PLT 111* 82* 70* 118*  112* 140*  --  092   Basic Metabolic Panel: Recent Labs  Lab 02/13/21 0655 02/14/21 0352 02/15/21 0229 02/16/21 0609 02/17/21 0519  NA 138 139 137 131* 129*  K 5.1 4.3 4.6 4.3 4.4  CL 106 109 107 100 97*  CO2 23 23 25 26 25   GLUCOSE 207* 183* 121* 128* 138*  BUN 86* 65* 55* 36* 32*  CREATININE 1.39* 1.06* 1.06* 0.93 0.95  CALCIUM 8.9 8.7* 8.6* 8.4* 8.4*  MG  --  2.5* 2.5*  2.4 2.2   GFR: Estimated Creatinine Clearance: 44.5 mL/min (by C-G formula based on SCr of 0.95 mg/dL). Liver Function Tests: Recent Labs  Lab 02/12/21 0617  AST 23  ALT 19  ALKPHOS 68  BILITOT 0.8  PROT 6.6  ALBUMIN 3.8   No results for input(s): LIPASE, AMYLASE in the last 168 hours. No results for input(s): AMMONIA in the last 168 hours. Coagulation Profile: Recent Labs  Lab 02/12/21 0803  INR 1.2   Cardiac Enzymes: No results for input(s): CKTOTAL, CKMB, CKMBINDEX, TROPONINI in the last 168 hours. BNP (last 3 results) No results for input(s): PROBNP in the last 8760 hours. HbA1C: No results for input(s): HGBA1C in the last 72 hours.  CBG: Recent Labs  Lab 02/16/21 0754 02/16/21 1156 02/16/21 1646 02/16/21 2118 02/17/21 0833  GLUCAP 126* 113* 124* 186* 144*   Lipid Profile: No results for input(s): CHOL,  HDL, LDLCALC, TRIG, CHOLHDL, LDLDIRECT in the last 72 hours. Thyroid Function Tests: No results for input(s): TSH, T4TOTAL, FREET4, T3FREE, THYROIDAB in the last 72 hours. Anemia Panel: No results for input(s): VITAMINB12, FOLATE, FERRITIN, TIBC, IRON, RETICCTPCT in the last 72 hours.  Sepsis Labs: Recent Labs  Lab 02/12/21 0617 02/12/21 0950 02/12/21 1606 02/12/21 2021 02/13/21 0725 02/15/21 0229  PROCALCITON  --   --   --   --  0.17 <0.10  LATICACIDVEN 2.9* 2.6* 2.8* 2.1*  --   --     Recent Results (from the past 240 hour(s))  Blood Culture (routine x 2)     Status: None   Collection Time: 02/12/21  6:48 AM   Specimen: BLOOD  Result Value Ref Range Status   Specimen Description BLOOD LEFT ARM  Final   Special Requests   Final    BOTTLES DRAWN AEROBIC AND ANAEROBIC Blood Culture adequate volume   Culture   Final    NO GROWTH 5 DAYS Performed at [facility], [address] [place], [place] [address]    Report Status 02/17/2021 FINAL  Final  Blood Culture (routine x 2)     Status: None   Collection Time: 02/12/21  6:48 AM    Specimen: BLOOD LEFT HAND  Result Value Ref Range Status   Specimen Description BLOOD LEFT HAND  Final   Special Requests   Final    BOTTLES DRAWN AEROBIC ONLY Blood Culture adequate volume   Culture   Final    NO GROWTH 5 DAYS Performed at [facility], [address] [place], [place] [address]    Report Status 02/17/2021 FINAL  Final  Resp Panel by RT-PCR (Flu A&B, Covid) Nasopharyngeal Swab     Status: None   Collection Time: 02/12/21  7:20 AM   Specimen: Nasopharyngeal Swab; Nasopharyngeal(NP) swabs in vial transport medium  Result Value Ref Range Status   SARS Coronavirus 2 by RT PCR NEGATIVE NEGATIVE Final    Comment: (NOTE) SARS-CoV-2 target nucleic acids are NOT DETECTED.  The SARS-CoV-2 RNA is generally detectable in upper respiratory specimens during the acute phase of infection. The lowest concentration of SARS-CoV-2 viral copies this assay can detect is 138 copies/mL. A negative result does not preclude SARS-Cov-2 infection and should not be used as the sole basis for treatment or other patient management decisions. A negative result may occur with  improper specimen collection/handling, submission of specimen other than nasopharyngeal swab, presence of viral mutation(s) within the areas targeted by this assay, and inadequate number of viral copies(<138 copies/mL). A negative result must be combined with clinical observations, patient history, and epidemiological information. The expected result is Negative.  Fact Sheet for Patients:  EntrepreneurPulse.com.au  Fact Sheet for Healthcare Providers:  IncredibleEmployment.be  This test is no t yet approved or cleared by the [place] FDA and  has been authorized for detection and/or diagnosis of SARS-CoV-2 by FDA under an Emergency Use Authorization (EUA). This EUA will remain  in effect (meaning this test can be used) for the duration of the COVID-19 declaration under  Section 564(b)(1) of the Act, 21 U.S.C.section 360bbb-3(b)(1), unless the authorization is terminated  or revoked sooner.       Influenza A by PCR NEGATIVE NEGATIVE Final   Influenza B by PCR NEGATIVE NEGATIVE Final    Comment: (NOTE) The Xpert Xpress SARS-CoV-2/FLU/RSV plus assay is intended as an aid in the diagnosis of influenza from Nasopharyngeal swab specimens and should not be used  as a sole basis for treatment. Nasal washings and aspirates are unacceptable for Xpert Xpress SARS-CoV-2/FLU/RSV testing.  Fact Sheet for Patients: EntrepreneurPulse.com.au  Fact Sheet for Healthcare Providers: IncredibleEmployment.be  This test is not yet approved or cleared by the [place] FDA and has been authorized for detection and/or diagnosis of SARS-CoV-2 by FDA under an Emergency Use Authorization (EUA). This EUA will remain in effect (meaning this test can be used) for the duration of the COVID-19 declaration under Section 564(b)(1) of the Act, 21 U.S.C. section 360bbb-3(b)(1), unless the authorization is terminated or revoked.  Performed at [facility], [address], [place], [place] [address]   Urine Culture     Status: Abnormal   Collection Time: 02/12/21  1:30 PM   Specimen: Urine, Random  Result Value Ref Range Status   Specimen Description   Final    URINE, RANDOM Performed at [facility], [address] [place], [place] [address]    Special Requests   Final    NONE Performed at [facility], [address] [place], [place] [address]    Culture (A)  Final    <10,000 COLONIES/mL INSIGNIFICANT GROWTH Performed at [facility], [place] [address], [place], [place] [address]    Report Status 02/13/2021 FINAL  Final  KOH prep     Status: None   Collection Time: 02/15/21 10:54 AM   Specimen: Bronchial Brush  Result Value Ref Range Status   Specimen Description BRONCHIAL BRUSHING  Final    Special Requests NONE  Final   KOH Prep   Final    BUDDING YEAST SEEN Performed at [facility], [address] [place], [place] [address]    Report Status 02/15/2021 FINAL  Final         Radiology Studies: No results found.      Scheduled Meds:  sodium chloride   Intravenous Once   acidophilus  1 capsule Oral Daily   allopurinol  200 mg Oral Daily   aspirin EC  81 mg Oral QHS   docusate sodium  100 mg Oral BID   FLUoxetine  10 mg Oral Daily   gabapentin  100 mg Oral Daily   And   gabapentin  700 mg Oral QHS   insulin aspart  0-15 Units Subcutaneous TID WC   levothyroxine  50 mcg Oral Q0600   levothyroxine  75 mcg Oral Q M,W,F   loratadine  10 mg Oral Daily   magnesium oxide  200 mg Oral Daily   mexiletine  200 mg Oral Q12H   midodrine  10 mg Oral TID   nystatin  5 mL Oral QID   pantoprazole (PROTONIX) IV  40 mg Intravenous Q12H   polyethylene glycol  17 g Oral Daily   Ensure Max Protein  11 oz Oral BID BM   simvastatin  20 mg Oral QHS   traZODone  50 mg Oral QHS   Continuous Infusions:   LOS: 5 days    Time spent: 25 minutes    Sidney Ace, MD [facility]   If 7PM-7AM, please contact night-coverage  02/17/2021, 10:32 AM

## 2021-02-17 NOTE — TOC Progression Note (Signed)
Transition of Care [facility]) - Progression Note    Patient Details  Name: MICHELENA CULMER MRN: [id number] Date of Birth: 28-Jan-1941  Transition of Care [facility]) CM/SW [place], LCSW Phone Number: 02/17/2021, 9:10 AM  Clinical Narrative:   Patient is active with [facility].    Expected Discharge Plan: [place] Barriers to Discharge: Continued Medical Work up  Expected Discharge Plan and Services Expected Discharge Plan: [place] arrangements for the past 2 months: [place] Agency: [place] Date [place]: 02/16/21   Representative spoke with at [place]: [name]   Social Determinants of Health ([place]) Interventions    Readmission Risk Interventions Readmission Risk Prevention Plan 02/16/2021 09/06/2019  Transportation Screening Complete Complete  Medication Review Press photographer) Complete Complete  PCP or Specialist appointment within 3-5 days of discharge Complete Complete  HRI or Home Care Consult Complete Complete  SW Recovery Care/Counseling Consult Complete Complete  Palliative Care Screening Not Applicable Not [place] Complete Not Applicable  Some recent data might be hidden

## 2021-02-17 NOTE — Progress Notes (Addendum)
Physical Therapy Treatment Patient Details Name: Heather Peterson MRN: [id number] DOB: Aug 11, 1940 Today's Date: 02/17/2021   History of Present Illness presented to ER secondary to progressive weakness; admitted for management of acute/chronic anemia, s/p EGD significant for multiple non-bleeding angioectasias, treated with argon beam coagulation (02/15/21).  No active bleeding noted per report    PT Comments    Pt seen for PT evaluation as pt had received BP meds this morning. Pt received asleep sitting upright in bed with phone & food in hand. Pt requires mod assist for supine>sit and min<>mod assist for sit<>stand & transfers to recliner with RW. Pt requires assistance for managing RW & extra time to follow commands to complete stepping to recliner. Pt does engage in BLE strengthening exercises while sitting in recliner. Pt left in chair set up with meal tray.   BP in RUE: Supine: 94/59 mmHg (MAP 71) Sitting EOB: 91/55 mmHg (MAP 60) - MD cleared pt to transfer to recliner via secure chat Sitting in recliner after transfer: 87/51 mmHg (MAP 64) Sitting in recliner at end of session: 98/62 mmHg (MAP 73)  Addendum: Updated d/c recommendations to SNF as pt would benefit from additional therapy at [facility] upon d/c to maximize independence with functional mobility, reduce fall risk, & decrease caregiver burden prior to return home.     Recommendations for follow up therapy are one component of a multi-disciplinary discharge planning process, led by the attending physician.  Recommendations may be updated based on patient status, additional functional criteria and insurance authorization.  Follow Up Recommendations  SNF;Supervision/Assistance - 24 hour     Equipment Recommendations  None recommended by PT    Recommendations for Other Services       Precautions / Restrictions Precautions Precautions: Fall;ICD/Pacemaker Restrictions Weight Bearing Restrictions: No     Mobility  Bed  Mobility Overal bed mobility: Needs Assistance Bed Mobility: Supine to Sit     Supine to sit: Mod assist;HOB elevated     General bed mobility comments: assistance to transfer BLE off of EOB, assistance to upright trunk, cuing to use bed rails    Transfers Overall transfer level: Needs assistance Equipment used: Rolling walker (2 wheeled) Transfers: Sit to/from Stand Sit to Stand: Mod assist;Min assist         General transfer comment: cuing for safe hand placement  Ambulation/Gait Ambulation/Gait assistance: Min assist;Mod assist Gait Distance (Feet): 2 Feet Assistive device: Rolling walker (2 wheeled) Gait Pattern/deviations: Decreased step length - right;Decreased step length - left;Decreased stride length;Decreased dorsiflexion - right;Decreased dorsiflexion - left Gait velocity: decreased   General Gait Details: Max cuing for sequencing, stepping. Assistance for managing RW. Delayed processing & requiring cuing for stepping.   Stairs             Wheelchair Mobility    Modified Rankin (Stroke Patients Only)       Balance   Sitting-balance support: Bilateral upper extremity supported;Feet supported Sitting balance-Leahy Scale: Fair Sitting balance - Comments: close supervision static sitting   Standing balance support: Bilateral upper extremity supported;During functional activity Standing balance-Leahy Scale: Poor Standing balance comment: BUE support on RW & min assist for balance                            Cognition Arousal/Alertness: Awake/alert Behavior During Therapy: WFL for tasks assessed/performed;Flat affect Overall Cognitive Status: Within Functional Limits for tasks assessed  General Comments: Easily & frequently drifts off to sleep, requires cuing for engagement.      Exercises General Exercises - Lower Extremity Long Arc Quad: AROM;Strengthening;Both;10 reps;Seated Heel  Slides: AROM;AAROM;Strengthening;Both;10 reps;Supine (multimodal cuing for technique) Hip ABduction/ADduction: AAROM;AROM;Strengthening;Both;10 reps;Supine (hip abduction slides x 10, hip adduction pillow squeezes x 10) Straight Leg Raises: AAROM;Strengthening;Both;10 reps;Supine Hip Flexion/Marching: AROM;Strengthening;Both;10 reps;Seated    General Comments General comments (skin integrity, edema, etc.): Pt on 2L/min via nasal cannula, SpO2 100%      Pertinent Vitals/Pain Pain Assessment: No/denies pain    Home Living                      Prior Function            PT Goals (current goals can now be found in the care plan section) Acute Rehab PT Goals Patient Stated Goal: to go home PT Goal Formulation: With patient/family Time For Goal Achievement: 03/01/21 Potential to Achieve Goals: Fair Progress towards PT goals: Progressing toward goals    Frequency    Min 2X/week      PT Plan Discharge plan needs to be updated    Co-evaluation              AM-PAC PT "6 Clicks" Mobility   Outcome Measure  Help needed turning from your back to your side while in a flat bed without using bedrails?: A Little Help needed moving from lying on your back to sitting on the side of a flat bed without using bedrails?: A Lot Help needed moving to and from a bed to a chair (including a wheelchair)?: A Lot Help needed standing up from a chair using your arms (e.g., wheelchair or bedside chair)?: A Lot Help needed to walk in hospital room?: A Lot Help needed climbing 3-5 steps with a railing? : Total 6 Click Score: 12    End of Session Equipment Utilized During Treatment: Oxygen Activity Tolerance: Patient limited by fatigue Patient left: in chair;with chair alarm set;with call bell/phone within reach Nurse Communication: Mobility status PT Visit Diagnosis: Muscle weakness (generalized) (M62.81);Difficulty in walking, not elsewhere classified (R26.2);History of falling  (Z91.81)     Time: 4782-9562 PT Time Calculation (min) (ACUTE ONLY): 19 min  Charges:  $Therapeutic Activity: 8-22 mins                     Lavone Nian, PT, DPT 02/17/21, 12:21 PM    Waunita Schooner 02/17/2021, 12:17 PM

## 2021-02-18 DIAGNOSIS — D649 Anemia, unspecified: Secondary | ICD-10-CM | POA: Diagnosis not present

## 2021-02-18 LAB — BASIC METABOLIC PANEL
Anion gap: 5 (ref 5–15)
BUN: 32 mg/dL — ABNORMAL HIGH (ref 8–23)
CO2: 25 mmol/L (ref 22–32)
Calcium: 8.2 mg/dL — ABNORMAL LOW (ref 8.9–10.3)
Chloride: 105 mmol/L (ref 98–111)
Creatinine, Ser: 0.94 mg/dL (ref 0.44–1.00)
GFR, Estimated: >60 mL/min (ref >60)
Glucose, Bld: 125 mg/dL — ABNORMAL HIGH (ref 70–99)
Potassium: 4.2 mmol/L (ref 3.5–5.1)
Sodium: 135 mmol/L (ref 135–145)

## 2021-02-18 LAB — CBC
HCT: 26.8 % — ABNORMAL LOW (ref 36.0–46.0)
Hemoglobin: 9 g/dL — ABNORMAL LOW (ref 12.0–15.0)
MCH: 30.4 pg (ref 26.0–34.0)
MCHC: 33.6 g/dL (ref 30.0–36.0)
MCV: 90.5 fL (ref 80.0–100.0)
Platelets: 208 10*3/uL (ref 150–400)
RBC: 2.96 MIL/uL — ABNORMAL LOW (ref 3.87–5.11)
RDW: 18.1 % — ABNORMAL HIGH (ref 11.5–15.5)
WBC: 15.5 10*3/uL — ABNORMAL HIGH (ref 4.0–10.5)
nRBC: 0.6 % — ABNORMAL HIGH (ref 0.0–0.2)

## 2021-02-18 LAB — GLUCOSE, CAPILLARY
Glucose-Capillary: 144 mg/dL — ABNORMAL HIGH (ref 70–99)
Glucose-Capillary: 186 mg/dL — ABNORMAL HIGH (ref 70–99)
Glucose-Capillary: 152 mg/dL — ABNORMAL HIGH (ref 70–99)
Glucose-Capillary: 196 mg/dL — ABNORMAL HIGH (ref 70–99)

## 2021-02-18 LAB — MAGNESIUM: Magnesium: 2 mg/dL (ref 1.7–2.4)

## 2021-02-18 MED ORDER — TORSEMIDE 20 MG PO TABS
20.0000 mg | ORAL_TABLET | Freq: Every day | ORAL | Status: DC
  Administered 2021-02-18: 20 mg via ORAL
  Filled 2021-02-18 (×2): qty 1

## 2021-02-18 MED ORDER — ALBUMIN HUMAN 25 % IV SOLN
25.0000 g | Freq: Once | INTRAVENOUS | Status: AC
  Administered 2021-02-18: 25 g via INTRAVENOUS
  Filled 2021-02-18: qty 100

## 2021-02-18 NOTE — TOC Progression Note (Signed)
Transition of Care [facility]) - Progression Note    Patient Details  Name: Heather Peterson MRN: [id number] Date of Birth: 1940-05-18  Transition of Care  [facility]) CM/SW [place], LCSW Phone Number: 02/18/2021, 4:16 PM  Clinical Narrative:   Spoke to patient's spouse about SNF recommendation. Patient refuses SNF due to bad experience in the past. Spouse feels comfortable with patient returning home with additional [facility] services. Spoke to [name] with [facility] who reported she can add RN, OT, and Aide to patient's current PT services.     Expected Discharge Plan: [place] Barriers to Discharge: Continued Medical Work up  Expected Discharge Plan and Services Expected Discharge Plan: [place] arrangements for the past 2 months: [place] Agency: [place] Date [place]: 02/16/21   Representative spoke with at [place]: [name]   Social Determinants of Health ([place]) Interventions    Readmission Risk Interventions Readmission Risk Prevention Plan 02/16/2021 09/06/2019  Transportation Screening Complete Complete  Medication Review Press photographer) Complete Complete  PCP or Specialist appointment within 3-5 days of discharge Complete Complete  HRI or Home Care Consult Complete Complete  SW Recovery Care/Counseling Consult Complete Complete  Palliative Care Screening Not Applicable Not [place] Complete Not Applicable  Some recent data might be hidden

## 2021-02-18 NOTE — Progress Notes (Signed)
PROGRESS NOTE    Heather Peterson  ZBM:[id number] DOB: [birth year] DOA: 02/12/2021 PCP: [name], MD    Brief Narrative:  80 y.o. 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  Patient states that she fell on the morning of her admission but denies hitting her head or any loss of consciousness.  Patient underwent upper EGD on 10/6.  Multiple nonbleeding angioectasias treated with argon beam coagulation.  Esophageal plaques concerning for candidiasis were found.  Brushings performed.  No signs of active bleed.  GI recommends continue present medications and PPI and advance diet as tolerated.    Assessment & Plan:   Principal Problem:   Symptomatic anemia Active Problems:   CKD stage 4 due to type 2 diabetes mellitus (HCC)   Chronic systolic CHF (congestive heart failure) (HCC)   ICD (implantable cardioverter-defibrillator) in place   Anemia in chronic kidney disease (CKD)   Multiple myeloma (HCC)   Hyperkalemia   Chronic respiratory failure (HCC)   Leukocytosis   Lactic acid acidosis  acute on chronic anemia  Hx of AVM treated in 2021 --Hgb appeared to be gradually trending down, 6.0 on presentation. --likely due to upper GI bleed, since BUN 109 markedly elevated.   --s/p 1u pRBC with appropriate rise to 7.1 -Hemoglobin 6.7 on 10/5 -Improved to 7.5 after transfusion -EGD on 10/6.  No active bleeding.  Nonbleeding ulcers treated with APC -Hemoglobin 7.2 on 10/7 -Hemoglobin 8.9 on 10/8 Plan: Continue PPI No transfusion indicated at this time Goal hemoglobin 8 Okay for diet as tolerated   Diabetes  mellitus with complications of stage IV chronic kidney disease --No hypoglycemics listed on home med --BG's have been elevated  Plan: -- Moderate sliding scale 3 times daily   AKI on CKD4 --Cr 2.0 on presentation.  Improved to 1.39 with IVF. --torsemide and spironolactone held on presentation. Plan: No further IVF Cautiously restart Demadex.  Started 20 mg a day (home dose 40 mg) Continue holding Aldactone   Hyperkalemia, resolved Probably medication induced. Without EKG changes --s/p dextrose and insulin -- Hold Aldactone   History of chronic systolic heart failure Last known LVEF of 25% from February, 2022 --not fluid overloaded --c cautiously restart torsemide --Hold Aldactone   Hypotension --pt has midodrine and torsemide and spironolactone on home med list Plan: -- Continue midodrine to 10 mg 3 times daily scheduled --Cautious restart torsemide at half home dose --Continue holding Aldactone for now --continue to hold torsemide and spironolactone    History of multiple myeloma On chemotherapy Follow-up with oncology as an outpatient   Sepsis, ruled out --Leukocytosis likely reactive.  No fever.  Procal 0.17.  No source of infection currently. --Patient started on broad-spectrum antibiotic therapy in the ER with vancomycin, cefepime and Flagyl -- No further antibiotics.  Low suspicion for infection   DVT prophylaxis: SCD Code Status: Full Family Communication: Linde Gillis [phone] on 10/9 Disposition Plan: Status is: Inpatient  Remains inpatient appropriate because:Inpatient level of care appropriate due to severity of illness  Dispo: The patient is from: Home              Anticipated d/c is to: Home  Patient currently is not medically stable to d/c.   Difficult to place patient No       Level of care: Med-Surg  Consultants:  GI  Procedures:  EGD 10/6  Antimicrobials: None   Subjective: Patient seen and examined.  No acute  status changes overnight.  More awake this morning.  Objective: Vitals:   02/17/21 1732 02/17/21 2104 02/18/21 0423 02/18/21 0816  BP: (!) 101/56 (!) 96/58 103/65 (!) 82/60  Pulse: 76 76 77 75  Resp: 12 20 18 18   Temp: 97.8 F (36.6 C) 97.8 F (36.6 C) 97.6 F (36.4 C) 97.6 F (36.4 C)  TempSrc: Oral Oral Oral Oral  SpO2: 100% 100% 100% 100%  Weight:      Height:        Intake/Output Summary (Last 24 hours) at 02/18/2021 1004 Last data filed at 02/18/2021 1002 Gross per 24 hour  Intake 360 ml  Output 1100 ml  Net -740 ml   Filed Weights   02/13/21 1534 02/15/21 1002  Weight: 74.1 kg 68.2 kg    Examination:  General exam: No acute distress. Respiratory system: Lungs clear.  Normal work of breathing.  2 L Cardiovascular system: S1-S2, RRR, no murmurs, trace pedal edema Gastrointestinal system: Obese, NT/ND, normal bowel sounds Central nervous system: Lethargic but alert and oriented.  No focal deficits Extremities: Symmetric 5 x 5 power. Skin: No rashes, lesions or ulcers Psychiatry: Judgement and insight appear normal. Mood & affect appropriate.     Data Reviewed: I have personally reviewed following labs and imaging studies  CBC: Recent Labs  Lab 02/12/21 0648 02/13/21 0655 02/14/21 0352 02/15/21 0229 02/16/21 0609 02/16/21 1701 02/17/21 0519 02/18/21 0408  WBC 23.7*   < > 18.1* 18.7*  19.1* 14.5*  --  15.1* 15.5*  NEUTROABS 18.9*  --   --  14.8*  --   --   --   --   HGB 6.0*   < > 6.7* 7.6*  7.5* 7.2* 9.3* 8.9* 9.0*  HCT 19.5*   < > 21.9* 22.8*  22.9* 22.2* 27.7* 26.2* 26.8*  MCV 84.1   < > 93.6 85.4  86.7 85.7  --  87.0 90.5  PLT 111*   < > 70* 118*  112* 140*  --  161 208   < > = values in this interval not displayed.   Basic Metabolic Panel: Recent Labs  Lab 02/14/21 0352 02/15/21 0229 02/16/21 0609 02/17/21 0519 02/18/21 0408  NA 139 137 131* 129* 135  K 4.3 4.6 4.3 4.4 4.2  CL 109 107 100 97* 105  CO2 23 25 26 25 25   GLUCOSE 183*  121* 128* 138* 125*  BUN 65* 55* 36* 32* 32*  CREATININE 1.06* 1.06* 0.93 0.95 0.94  CALCIUM 8.7* 8.6* 8.4* 8.4* 8.2*  MG 2.5* 2.5* 2.4 2.2 2.0   GFR: Estimated Creatinine Clearance: 45 mL/min (by C-G formula based on SCr of 0.94 mg/dL). Liver Function Tests: Recent Labs  Lab 02/12/21 0617  AST 23  ALT 19  ALKPHOS 68  BILITOT 0.8  PROT 6.6  ALBUMIN 3.8   No results for input(s): LIPASE, AMYLASE in the last 168 hours. No results for input(s): AMMONIA in the last 168 hours. Coagulation Profile: Recent Labs  Lab 02/12/21 0803  INR 1.2   Cardiac Enzymes: No results for input(s): CKTOTAL, CKMB, CKMBINDEX, TROPONINI in the last 168 hours. BNP (last 3 results) No results for input(s): PROBNP in the last 8760 hours. HbA1C: No results for  input(s): HGBA1C in the last 72 hours.  CBG: Recent Labs  Lab 02/17/21 0833 02/17/21 1149 02/17/21 1729 02/17/21 2119 02/18/21 0822  GLUCAP 144* 129* 182* 150* 144*   Lipid Profile: No results for input(s): CHOL, HDL, LDLCALC, TRIG, CHOLHDL, LDLDIRECT in the last 72 hours. Thyroid Function Tests: No results for input(s): TSH, T4TOTAL, FREET4, T3FREE, THYROIDAB in the last 72 hours. Anemia Panel: No results for input(s): VITAMINB12, FOLATE, FERRITIN, TIBC, IRON, RETICCTPCT in the last 72 hours.  Sepsis Labs: Recent Labs  Lab 02/12/21 0617 02/12/21 0950 02/12/21 1606 02/12/21 2021 02/13/21 0725 02/15/21 0229  PROCALCITON  --   --   --   --  0.17 <0.10  LATICACIDVEN 2.9* 2.6* 2.8* 2.1*  --   --     Recent Results (from the past 240 hour(s))  Blood Culture (routine x 2)     Status: None   Collection Time: 02/12/21  6:48 AM   Specimen: BLOOD  Result Value Ref Range Status   Specimen Description BLOOD LEFT ARM  Final   Special Requests   Final    BOTTLES DRAWN AEROBIC AND ANAEROBIC Blood Culture adequate volume   Culture   Final    NO GROWTH 5 DAYS Performed at [facility], [address], [place], [place]  [address]    Report Status 02/17/2021 FINAL  Final  Blood Culture (routine x 2)     Status: None   Collection Time: 02/12/21  6:48 AM   Specimen: BLOOD LEFT HAND  Result Value Ref Range Status   Specimen Description BLOOD LEFT HAND  Final   Special Requests   Final    BOTTLES DRAWN AEROBIC ONLY Blood Culture adequate volume   Culture   Final    NO GROWTH 5 DAYS Performed at [facility], [address] [place], [place] [address]    Report Status 02/17/2021 FINAL  Final  Resp Panel by RT-PCR (Flu A&B, Covid) Nasopharyngeal Swab     Status: None   Collection Time: 02/12/21  7:20 AM   Specimen: Nasopharyngeal Swab; Nasopharyngeal(NP) swabs in vial transport medium  Result Value Ref Range Status   SARS Coronavirus 2 by RT PCR NEGATIVE NEGATIVE Final    Comment: (NOTE) SARS-CoV-2 target nucleic acids are NOT DETECTED.  The SARS-CoV-2 RNA is generally detectable in upper respiratory specimens during the acute phase of infection. The lowest concentration of SARS-CoV-2 viral copies this assay can detect is 138 copies/mL. A negative result does not preclude SARS-Cov-2 infection and should not be used as the sole basis for treatment or other patient management decisions. A negative result may occur with  improper specimen collection/handling, submission of specimen other than nasopharyngeal swab, presence of viral mutation(s) within the areas targeted by this assay, and inadequate number of viral copies(<138 copies/mL). A negative result must be combined with clinical observations, patient history, and epidemiological information. The expected result is Negative.  Fact Sheet for Patients:  EntrepreneurPulse.com.au  Fact Sheet for Healthcare Providers:  IncredibleEmployment.be  This test is no t yet approved or cleared by the [place] FDA and  has been authorized for detection and/or diagnosis of SARS-CoV-2 by FDA under an Emergency Use  Authorization (EUA). This EUA will remain  in effect (meaning this test can be used) for the duration of the COVID-19 declaration under Section 564(b)(1) of the Act, 21 U.S.C.section 360bbb-3(b)(1), unless the authorization is terminated  or revoked sooner.       Influenza A by PCR NEGATIVE NEGATIVE Final  Influenza B by PCR NEGATIVE NEGATIVE Final    Comment: (NOTE) The Xpert Xpress SARS-CoV-2/FLU/RSV plus assay is intended as an aid in the diagnosis of influenza from Nasopharyngeal swab specimens and should not be used as a sole basis for treatment. Nasal washings and aspirates are unacceptable for Xpert Xpress SARS-CoV-2/FLU/RSV testing.  Fact Sheet for Patients: EntrepreneurPulse.com.au  Fact Sheet for Healthcare Providers: IncredibleEmployment.be  This test is not yet approved or cleared by the [place] FDA and has been authorized for detection and/or diagnosis of SARS-CoV-2 by FDA under an Emergency Use Authorization (EUA). This EUA will remain in effect (meaning this test can be used) for the duration of the COVID-19 declaration under Section 564(b)(1) of the Act, 21 U.S.C. section 360bbb-3(b)(1), unless the authorization is terminated or revoked.  Performed at [facility], [address], [place], [place] [address]   Urine Culture     Status: Abnormal   Collection Time: 02/12/21  1:30 PM   Specimen: Urine, Random  Result Value Ref Range Status   Specimen Description   Final    URINE, RANDOM Performed at [facility], [address] [place], [place] [address]    Special Requests   Final    NONE Performed at [facility], [address] [place], [place] [address]    Culture (A)  Final    <10,000 COLONIES/mL INSIGNIFICANT GROWTH Performed at [facility], [place] [address], [place], [place] [address]    Report Status 02/13/2021 FINAL  Final  KOH prep     Status: None   Collection  Time: 02/15/21 10:54 AM   Specimen: Bronchial Brush  Result Value Ref Range Status   Specimen Description BRONCHIAL BRUSHING  Final   Special Requests NONE  Final   KOH Prep   Final    BUDDING YEAST SEEN Performed at [facility], [address], [place], [place] [address]    Report Status 02/15/2021 FINAL  Final         Radiology Studies: No results found.      Scheduled Meds:  sodium chloride   Intravenous Once   acidophilus  1 capsule Oral Daily   allopurinol  200 mg Oral Daily   aspirin EC  81 mg Oral QHS   docusate sodium  100 mg Oral BID   FLUoxetine  10 mg Oral Daily   gabapentin  100 mg Oral Daily   And   gabapentin  700 mg Oral QHS   insulin aspart  0-15 Units Subcutaneous TID WC   levothyroxine  50 mcg Oral Q0600   levothyroxine  75 mcg Oral Q M,W,F   loratadine  10 mg Oral Daily   magnesium oxide  200 mg Oral Daily   mexiletine  200 mg Oral Q12H   midodrine  10 mg Oral TID   nystatin  5 mL Oral QID   pantoprazole (PROTONIX) IV  40 mg Intravenous Q12H   polyethylene glycol  17 g Oral Daily   Ensure Max Protein  11 oz Oral BID BM   simvastatin  20 mg Oral QHS   torsemide  20 mg Oral Daily   traZODone  50 mg Oral QHS   Continuous Infusions:  albumin human       LOS: 6 days    Time spent: 25 minutes    Sidney Ace, MD [facility]   If 7PM-7AM, please contact night-coverage  02/18/2021, 10:04 AM

## 2021-02-19 ENCOUNTER — Encounter: Payer: Self-pay | Admitting: Oncology

## 2021-02-19 ENCOUNTER — Other Ambulatory Visit: Payer: Self-pay

## 2021-02-19 DIAGNOSIS — C9000 Multiple myeloma not having achieved remission: Secondary | ICD-10-CM

## 2021-02-19 DIAGNOSIS — D649 Anemia, unspecified: Secondary | ICD-10-CM | POA: Diagnosis not present

## 2021-02-19 LAB — GLUCOSE, CAPILLARY
Glucose-Capillary: 118 mg/dL — ABNORMAL HIGH (ref 70–99)
Glucose-Capillary: 184 mg/dL — ABNORMAL HIGH (ref 70–99)
Glucose-Capillary: 155 mg/dL — ABNORMAL HIGH (ref 70–99)
Glucose-Capillary: 191 mg/dL — ABNORMAL HIGH (ref 70–99)

## 2021-02-19 MED ORDER — TORSEMIDE 20 MG PO TABS
40.0000 mg | ORAL_TABLET | Freq: Every day | ORAL | Status: DC
  Administered 2021-02-19 – 2021-02-20 (×2): 40 mg via ORAL
  Filled 2021-02-19 (×2): qty 2

## 2021-02-19 NOTE — Progress Notes (Addendum)
Physical Therapy Treatment Patient Details Name: Heather Peterson MRN: [id number] DOB: Jul 14, 1940 Today's Date: 02/19/2021   History of Present Illness presented to ER secondary to progressive weakness; admitted for management of acute/chronic anemia, s/p EGD significant for multiple non-bleeding angioectasias, treated with argon beam coagulation (02/15/21).  No active bleeding noted per report    PT Comments    Pt seen for PT tx with pt agreeable. Pt requires mod assist for supine>sit 2/2 decreased ability to upright trunk. Min assist for transfers with ongoing cuing for technique & hand placement but is able to progress to ambulating in room with RW & CGA. PT educates pt on side stepping in small space with RW and need for increased gait speed as pt ambulates at a significantly decreased pace. Pt demonstrates improving functional mobility & anticipate pt can d/c home with HHPT f/u and 24 hr supervision. Will continue to follow pt acutely to progress gait & address endurance, strength as able.   BP checked in RUE: Supine: 88/58 mmHg (MAP 68) After transferring to sitting EOB: 92/51 mmHg (MAP 62) After toileting while sitting in recliner: 99/57 mmHg (MAP 71) At end of session, sitting in recliner, after walking: 100/62 mmHg (MAP 70)  Addendum: Encouraged pt to ambulate at home but also have assistance from family to do so; pt voiced understanding.    Recommendations for follow up therapy are one component of a multi-disciplinary discharge planning process, led by the attending physician.  Recommendations may be updated based on patient status, additional functional criteria and insurance authorization.  Follow Up Recommendations  Home health PT;Supervision/Assistance - 24 hour     Equipment Recommendations  Rolling walker with 5" wheels    Recommendations for Other Services       Precautions / Restrictions Precautions Precautions: Fall;ICD/Pacemaker Restrictions Weight Bearing  Restrictions: No     Mobility  Bed Mobility Overal bed mobility: Needs Assistance Bed Mobility: Supine to Sit     Supine to sit: Mod assist     General bed mobility comments: assistance to upright trunk, extra time, use of bed rails    Transfers Overall transfer level: Needs assistance Equipment used: Rolling walker (2 wheeled);1 person hand held assist Transfers: Sit to/from Stand;Stand Pivot Transfers Sit to Stand: Min assist Stand pivot transfers: Min assist       General transfer comment: Cuing for hand placement  Ambulation/Gait Ambulation/Gait assistance: Min guard Gait Distance (Feet): 30 Feet Assistive device: Rolling walker (2 wheeled) Gait Pattern/deviations: Decreased step length - right;Decreased step length - left;Decreased stride length;Decreased dorsiflexion - right;Decreased dorsiflexion - left Gait velocity: Significantly decreased with cuing to increase gait speed   General Gait Details: Cuing for side stepping in narrow space at end of bed   Stairs             Wheelchair Mobility    Modified Rankin (Stroke Patients Only)       Balance Overall balance assessment: Needs assistance Sitting-balance support: Bilateral upper extremity supported;Feet supported Sitting balance-Leahy Scale: Good Sitting balance - Comments: close supervision static sitting   Standing balance support: Bilateral upper extremity supported;During functional activity Standing balance-Leahy Scale: Poor Standing balance comment: requires BUE support, experiences LOB onto bed when attempting to pull underwear below hips, requires min assist when standing without UE support                            Cognition Arousal/Alertness: Awake/alert Behavior During Therapy: [facility] for  tasks assessed/performed;Flat affect Overall Cognitive Status: Within Functional Limits for tasks assessed                                 General Comments: much more  alert compared to last time this PT saw her      Exercises      General Comments General comments (skin integrity, edema, etc.): Pt on 2L/min via nasal cannula & SPO2 >90%, pt with continent BM on BSC, performing peri hygiene with only assistance for thoroughness      Pertinent Vitals/Pain Pain Assessment: No/denies pain    Home Living                      Prior Function            PT Goals (current goals can now be found in the care plan section) Acute Rehab PT Goals Patient Stated Goal: to go home PT Goal Formulation: With patient/family Time For Goal Achievement: 03/01/21 Potential to Achieve Goals: Fair Progress towards PT goals: Progressing toward goals    Frequency    Min 2X/week      PT Plan Discharge plan needs to be updated    Co-evaluation              AM-PAC PT "6 Clicks" Mobility   Outcome Measure  Help needed turning from your back to your side while in a flat bed without using bedrails?: A Little Help needed moving from lying on your back to sitting on the side of a flat bed without using bedrails?: A Lot Help needed moving to and from a bed to a chair (including a wheelchair)?: A Little Help needed standing up from a chair using your arms (e.g., wheelchair or bedside chair)?: A Little Help needed to walk in hospital room?: A Little Help needed climbing 3-5 steps with a railing? : A Lot 6 Click Score: 16    End of Session Equipment Utilized During Treatment: Oxygen Activity Tolerance: Patient tolerated treatment well Patient left: in chair;with chair alarm set;with call bell/phone within reach Nurse Communication: Mobility status PT Visit Diagnosis: Muscle weakness (generalized) (M62.81);Difficulty in walking, not elsewhere classified (R26.2);History of falling (Z91.81)     Time: 1610-9604 PT Time Calculation (min) (ACUTE ONLY): 38 min  Charges:  $Therapeutic Activity: 38-52 mins                     Lavone Nian, PT,  DPT 02/19/21, 4:39 PM    Waunita Schooner 02/19/2021, 4:36 PM

## 2021-02-19 NOTE — Progress Notes (Signed)
PROGRESS NOTE    Heather Peterson  OVA:[id number] DOB: [birth year] DOA: 02/12/2021 PCP: [name], MD    Brief Narrative:  80 y.o. 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  Patient states that she fell on the morning of her admission but denies hitting her head or any loss of consciousness.  Patient underwent upper EGD on 10/6.  Multiple nonbleeding angioectasias treated with argon beam coagulation.  Esophageal plaques concerning for candidiasis were found.  Brushings performed.  No signs of active bleed.  GI recommends continue present medications and PPI and advance diet as tolerated.   Patient's been persistently hypotensive precluding addition of home diuretics.  Torsemide 20 mg daily started on 10/9.  Patient tolerated well.  Plan to increase dose to 40 mg per home dose on 10/10   Assessment & Plan:   Principal Problem:   Symptomatic anemia Active Problems:   CKD stage 4 due to type 2 diabetes mellitus (HCC)   Chronic systolic CHF (congestive heart failure) (HCC)   ICD (implantable cardioverter-defibrillator) in place   Anemia in chronic kidney disease (CKD)   Multiple myeloma (HCC)   Hyperkalemia   Chronic respiratory failure (HCC)   Leukocytosis   Lactic acid acidosis  acute on chronic anemia  Hx of AVM treated in 2021 --Hgb appeared to be gradually trending down, 6.0 on presentation. --likely due to upper GI bleed, since BUN 109 markedly elevated.   --s/p 1u pRBC with appropriate rise to 7.1 -Hemoglobin 6.7 on 10/5 -Improved to 7.5 after transfusion -EGD on 10/6.  No active bleeding.   Nonbleeding ulcers treated with APC -Hemoglobin 7.2 on 10/7 -Hemoglobin 8.9 on 10/8 Plan: Continue PPI No transfusion indicated at this time Goal hemoglobin 8 Okay for diet as tolerated   Diabetes mellitus with complications of stage IV chronic kidney disease --No hypoglycemics listed on home med --BG's have been elevated  Plan: -- Moderate sliding scale 3 times daily   AKI on CKD4 --Cr 2.0 on presentation.  Improved to 1.39 with IVF. --torsemide and spironolactone held on presentation. Plan: No further IVF Increase Demadex to 40 mg a day (home dose) Continue holding Aldactone, blood pressure remained stable revealed restart tomorrow   Hyperkalemia, resolved Probably medication induced. Without EKG changes --s/p dextrose and insulin -- Hold Aldactone   History of chronic systolic heart failure Last known LVEF of 25% from February, 2022 --not fluid overloaded -- Torsemide 40 mg --Hold Aldactone   Hypotension --pt has midodrine and torsemide and spironolactone on home med list Plan: -- Continue midodrine to 10 mg 3 times daily scheduled -- Torsemide 40 mg daily --Continue holding Aldactone for now    History of multiple myeloma On chemotherapy Follow-up with oncology as an outpatient   Sepsis, ruled out --Leukocytosis likely reactive.  No fever.  Procal 0.17.  No source of infection currently. --Patient started on broad-spectrum antibiotic therapy in the ER with vancomycin, cefepime and Flagyl -- No further antibiotics.  Low suspicion for infection   DVT prophylaxis: SCD Code Status: Full Family Communication: Linde Gillis [phone] on 10/9 Disposition Plan: Status is: Inpatient  Remains inpatient appropriate because:Inpatient level of care appropriate due to severity of  illness  Dispo: The patient is from: Home              Anticipated d/c is to: Home with home health              Patient currently is not medically stable to d/c.   Difficult to place  patient No  Anticipated date of discharge 10/11     Level of care: Med-Surg  Consultants:  GI  Procedures:  EGD 10/6  Antimicrobials: None   Subjective: Patient seen and examined.  No acute status changes overnight.  Feels well this morning.  Objective: Vitals:   02/18/21 0816 02/18/21 1647 02/19/21 0441 02/19/21 0800  BP: (!) 82/60 (!) 86/55 98/69 100/68  Pulse: 75 75 73 77  Resp: 18 16 16 16   Temp: 97.6 F (36.4 C) 97.7 F (36.5 C) 97.9 F (36.6 C) 97.6 F (36.4 C)  TempSrc: Oral Oral Oral Oral  SpO2: 100% 100% 100% 100%  Weight:      Height:        Intake/Output Summary (Last 24 hours) at 02/19/2021 1009 Last data filed at 02/19/2021 0931 Gross per 24 hour  Intake 360 ml  Output 800 ml  Net -440 ml   Filed Weights   02/13/21 1534 02/15/21 1002  Weight: 74.1 kg 68.2 kg    Examination:  General exam: No acute distress Respiratory system: Lungs clear.  Normal work of breathing.  2 L Cardiovascular system: S1-S2, RRR, no murmurs, trace pedal edema Gastrointestinal system: Obese, NT/ND, normal bowel sounds Central nervous system: Alert and oriented.  No focal deficits Extremities: Symmetric 5 x 5 power. Skin: No rashes, lesions or ulcers Psychiatry: Judgement and insight appear normal. Mood & affect appropriate.     Data Reviewed: I have personally reviewed following labs and imaging studies  CBC: Recent Labs  Lab 02/14/21 0352 02/15/21 0229 02/16/21 0609 02/16/21 1701 02/17/21 0519 02/18/21 0408  WBC 18.1* 18.7*  19.1* 14.5*  --  15.1* 15.5*  NEUTROABS  --  14.8*  --   --   --   --   HGB 6.7* 7.6*  7.5* 7.2* 9.3* 8.9* 9.0*  HCT 21.9* 22.8*  22.9* 22.2* 27.7* 26.2* 26.8*  MCV 93.6 85.4  86.7 85.7  --  87.0 90.5  PLT 70* 118*  112* 140*  --  161 295   Basic Metabolic Panel: Recent Labs  Lab 02/14/21 0352 02/15/21 0229 02/16/21 0609 02/17/21 0519 02/18/21 0408  NA 139 137 131* 129* 135  K 4.3 4.6 4.3 4.4 4.2  CL 109 107 100  97* 105  CO2 23 25 26 25 25   GLUCOSE 183* 121* 128* 138* 125*  BUN 65* 55* 36* 32* 32*  CREATININE 1.06* 1.06* 0.93 0.95 0.94  CALCIUM 8.7* 8.6* 8.4* 8.4* 8.2*  MG 2.5* 2.5* 2.4 2.2 2.0   GFR: Estimated Creatinine Clearance: 45 mL/min (by C-G formula based on SCr of 0.94 mg/dL). Liver Function Tests: No results for input(s): AST, ALT, ALKPHOS, BILITOT, PROT, ALBUMIN in the last 168 hours.  No results for input(s): LIPASE, AMYLASE in the last 168 hours. No results for input(s): AMMONIA in the last 168 hours. Coagulation Profile: No results for input(s): INR, PROTIME in the last 168 hours.  Cardiac Enzymes: No results for input(s): CKTOTAL, CKMB, CKMBINDEX, TROPONINI in the last 168 hours. BNP (last 3 results) No results for input(s): PROBNP in the last 8760 hours. HbA1C: No results for input(s): HGBA1C in the last 72 hours.  CBG: Recent  Labs  Lab 02/18/21 0822 02/18/21 1121 02/18/21 1642 02/18/21 2103 02/19/21 0753  GLUCAP 144* 186* 152* 196* 118*   Lipid Profile: No results for input(s): CHOL, HDL, LDLCALC, TRIG, CHOLHDL, LDLDIRECT in the last 72 hours. Thyroid Function Tests: No results for input(s): TSH, T4TOTAL, FREET4, T3FREE, THYROIDAB in the last 72 hours. Anemia Panel: No results for input(s): VITAMINB12, FOLATE, FERRITIN, TIBC, IRON, RETICCTPCT in the last 72 hours.  Sepsis Labs: Recent Labs  Lab 02/12/21 1606 02/12/21 2021 02/13/21 0725 02/15/21 0229  PROCALCITON  --   --  0.17 <0.10  LATICACIDVEN 2.8* 2.1*  --   --     Recent Results (from the past 240 hour(s))  Blood Culture (routine x 2)     Status: None   Collection Time: 02/12/21  6:48 AM   Specimen: BLOOD  Result Value Ref Range Status   Specimen Description BLOOD LEFT ARM  Final   Special Requests   Final    BOTTLES DRAWN AEROBIC AND ANAEROBIC Blood Culture adequate volume   Culture   Final    NO GROWTH 5 DAYS Performed at [facility], [address], [place], [place]  [address]    Report Status 02/17/2021 FINAL  Final  Blood Culture (routine x 2)     Status: None   Collection Time: 02/12/21  6:48 AM   Specimen: BLOOD LEFT HAND  Result Value Ref Range Status   Specimen Description BLOOD LEFT HAND  Final   Special Requests   Final    BOTTLES DRAWN AEROBIC ONLY Blood Culture adequate volume   Culture   Final    NO GROWTH 5 DAYS Performed at [facility], [address] [place], [place] [address]    Report Status 02/17/2021 FINAL  Final  Resp Panel by RT-PCR (Flu A&B, Covid) Nasopharyngeal Swab     Status: None   Collection Time: 02/12/21  7:20 AM   Specimen: Nasopharyngeal Swab; Nasopharyngeal(NP) swabs in vial transport medium  Result Value Ref Range Status   SARS Coronavirus 2 by RT PCR NEGATIVE NEGATIVE Final    Comment: (NOTE) SARS-CoV-2 target nucleic acids are NOT DETECTED.  The SARS-CoV-2 RNA is generally detectable in upper respiratory specimens during the acute phase of infection. The lowest concentration of SARS-CoV-2 viral copies this assay can detect is 138 copies/mL. A negative result does not preclude SARS-Cov-2 infection and should not be used as the sole basis for treatment or other patient management decisions. A negative result may occur with  improper specimen collection/handling, submission of specimen other than nasopharyngeal swab, presence of viral mutation(s) within the areas targeted by this assay, and inadequate number of viral copies(<138 copies/mL). A negative result must be combined with clinical observations, patient history, and epidemiological information. The expected result is Negative.  Fact Sheet for Patients:  EntrepreneurPulse.com.au  Fact Sheet for Healthcare Providers:  IncredibleEmployment.be  This test is no t yet approved or cleared by the [place] FDA and  has been authorized for detection and/or diagnosis of SARS-CoV-2 by FDA under an Emergency Use  Authorization (EUA). This EUA will remain  in effect (meaning this test can be used) for the duration of the COVID-19 declaration under Section 564(b)(1) of the Act, 21 U.S.C.section 360bbb-3(b)(1), unless the authorization is terminated  or revoked sooner.       Influenza A by PCR NEGATIVE NEGATIVE Final   Influenza B by PCR NEGATIVE NEGATIVE Final    Comment: (NOTE) The Xpert Xpress SARS-CoV-2/FLU/RSV plus assay is intended  as an aid in the diagnosis of influenza from Nasopharyngeal swab specimens and should not be used as a sole basis for treatment. Nasal washings and aspirates are unacceptable for Xpert Xpress SARS-CoV-2/FLU/RSV testing.  Fact Sheet for Patients: EntrepreneurPulse.com.au  Fact Sheet for Healthcare Providers: IncredibleEmployment.be  This test is not yet approved or cleared by the [place] FDA and has been authorized for detection and/or diagnosis of SARS-CoV-2 by FDA under an Emergency Use Authorization (EUA). This EUA will remain in effect (meaning this test can be used) for the duration of the COVID-19 declaration under Section 564(b)(1) of the Act, 21 U.S.C. section 360bbb-3(b)(1), unless the authorization is terminated or revoked.  Performed at [facility], [address] [place], [place] [address]   Urine Culture     Status: Abnormal   Collection Time: 02/12/21  1:30 PM   Specimen: Urine, Random  Result Value Ref Range Status   Specimen Description   Final    URINE, RANDOM Performed at [facility], [address] [place], [place] [address]    Special Requests   Final    NONE Performed at [facility], [address] [place], [place] [address]    Culture (A)  Final    <10,000 COLONIES/mL INSIGNIFICANT GROWTH Performed at [facility], [place] [address] [place], [place] [address]    Report Status 02/13/2021 FINAL  Final  KOH prep     Status: None   Collection  Time: 02/15/21 10:54 AM   Specimen: Bronchial Brush  Result Value Ref Range Status   Specimen Description BRONCHIAL BRUSHING  Final   Special Requests NONE  Final   KOH Prep   Final    BUDDING YEAST SEEN Performed at [facility], [address], [place], [place] [address]    Report Status 02/15/2021 FINAL  Final         Radiology Studies: No results found.      Scheduled Meds:  sodium chloride   Intravenous Once   acidophilus  1 capsule Oral Daily   allopurinol  200 mg Oral Daily   aspirin EC  81 mg Oral QHS   docusate sodium  100 mg Oral BID   FLUoxetine  10 mg Oral Daily   gabapentin  100 mg Oral Daily   And   gabapentin  700 mg Oral QHS   insulin aspart  0-15 Units Subcutaneous TID WC   levothyroxine  50 mcg Oral Q0600   levothyroxine  75 mcg Oral Q M,W,F   loratadine  10 mg Oral Daily   magnesium oxide  200 mg Oral Daily   mexiletine  200 mg Oral Q12H   midodrine  10 mg Oral TID   nystatin  5 mL Oral QID   pantoprazole (PROTONIX) IV  40 mg Intravenous Q12H   polyethylene glycol  17 g Oral Daily   Ensure Max Protein  11 oz Oral BID BM   simvastatin  20 mg Oral QHS   torsemide  40 mg Oral Daily   traZODone  50 mg Oral QHS   Continuous Infusions:     LOS: 7 days    Time spent: 25 minutes    Sidney Ace, MD [facility]   If 7PM-7AM, please contact night-coverage  02/19/2021, 10:09 AM

## 2021-02-19 NOTE — Care Management Important Message (Signed)
Important Message  Patient Details  Name: Heather Peterson MRN: [id number] Date of Birth: Apr 02, 1941   Medicare Important Message Given:  Yes     Dannette Barbara 02/19/2021, 4:08 PM

## 2021-02-19 NOTE — Telephone Encounter (Signed)
Patient evaluated by Dr. Grayland Ormond on 02/16/21 while inpatient.

## 2021-02-20 ENCOUNTER — Inpatient Hospital Stay: Payer: Medicare Other

## 2021-02-20 ENCOUNTER — Inpatient Hospital Stay: Payer: Medicare Other | Admitting: Oncology

## 2021-02-20 DIAGNOSIS — D649 Anemia, unspecified: Secondary | ICD-10-CM | POA: Diagnosis not present

## 2021-02-20 LAB — GLUCOSE, CAPILLARY
Glucose-Capillary: 195 mg/dL — ABNORMAL HIGH (ref 70–99)
Glucose-Capillary: 136 mg/dL — ABNORMAL HIGH (ref 70–99)

## 2021-02-20 MED ORDER — MIDODRINE HCL 10 MG PO TABS: 10.0000 mg | ORAL_TABLET | Freq: Three times a day (TID) | ORAL | 0 refills | Status: AC

## 2021-02-20 MED ORDER — SPIRONOLACTONE 25 MG PO TABS
12.5000 mg | ORAL_TABLET | Freq: Every day | ORAL | Status: DC
  Administered 2021-02-20: 12.5 mg via ORAL
  Filled 2021-02-20: qty 0.5
  Filled 2021-02-20 (×2): qty 1

## 2021-02-20 NOTE — Discharge Summary (Signed)
Physician Discharge Summary  Chimamanda Siegfried Kapusta WHQ:[id number] DOB: 09-Jan-1941 DOA: 02/12/2021  PCP: [name], MD  Admit date: 02/12/2021 Discharge date: 02/20/2021  Admitted From: Home Disposition: Home with home health  Recommendations for Outpatient Follow-up:  Follow up with PCP in 1-2 weeks Follow-up as directed with oncology  Home Health: Yes, PT OT RN aide Equipment/Devices: Oxygen 2 L via nasal cannula  Discharge Condition: Stable CODE STATUS: Full Diet recommendation: Regular  Brief/Interim Summary: 80 y.o. 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  Patient states that she fell on the morning of her admission but denies hitting her head or any loss of consciousness.   Patient underwent upper EGD on 10/6.  Multiple nonbleeding angioectasias treated with argon beam coagulation.  Esophageal plaques concerning for candidiasis were found.  Brushings performed.  No signs of active bleed.  GI recommends continue present medications and PPI and advance diet as tolerated.               Patient's been persistently hypotensive precluding addition of home diuretics.  Torsemide 20 mg daily started on 10/9.  Patient tolerated well.  Increased dose of torsemide to 40 mg (home dose) on 10/10.  Added home dose of Aldactone on 10/11.  Patient tolerated addition of diuretics well.  Stable for discharge home with home health at this time.  Home dose of midodrine increased to 10 mg p.o. 3 times daily.  Follow-up outpatient with PCP and oncology.   Discharge Diagnoses:  Principal Problem:   Symptomatic anemia Active  Problems:   CKD stage 4 due to type 2 diabetes mellitus (HCC)   Chronic systolic CHF (congestive heart failure) (HCC)   ICD (implantable cardioverter-defibrillator) in place   Anemia in chronic kidney disease (CKD)   Multiple myeloma (HCC)   Hyperkalemia   Chronic respiratory failure (HCC)   Leukocytosis   Lactic acid acidosis  acute on chronic anemia  Hx of AVM treated in 2021 --Hgb appeared to be gradually trending down, 6.0 on presentation. --likely due to upper GI bleed, since BUN 109 markedly elevated.   --s/p 1u pRBC with appropriate rise to 7.1 -Hemoglobin 6.7 on 10/5 -Improved to 7.5 after transfusion -EGD on 10/6.  No active bleeding.  Nonbleeding ulcers treated with APC -Hemoglobin 7.2 on 10/7 -Hemoglobin 8.9 on 10/8 Plan: Discharge home.  Resume home PPI.  Follow-up outpatient PCP and oncology     Diabetes mellitus with complications of stage IV chronic kidney disease --No hypoglycemics listed on home med -- Follow-up outpatient PCP   AKI on CKD4 --Cr 2.0 on presentation.  Improved to 1.39 with IVF. --torsemide and spironolactone held on presentation. Restarted on discharge   Hyperkalemia, resolved Probably medication induced. Without EKG changes    History of chronic systolic heart failure Last known LVEF of 25% from February, 2022 --not fluid overloaded -- Torsemide 40 mg -- Restart Aldactone   Hypotension --pt has midodrine and torsemide and spironolactone on home med list Plan: -- Continue midodrine to 10 mg 3 times daily scheduled -- Torsemide 40 mg daily -- Aldactone 12.5 daily     History of multiple myeloma On chemotherapy Follow-up with oncology as an outpatient  Sepsis, ruled out --Leukocytosis likely reactive.  No fever.  Procal 0.17.  No source of infection currently. --Patient started on broad-spectrum antibiotic therapy in the ER with vancomycin, cefepime and Flagyl -- No further antibiotics.  Low suspicion for  infection  Discharge Instructions  Discharge Instructions     Diet - low sodium heart healthy   Complete by: As directed    Increase activity slowly   Complete by: As directed    No wound care   Complete by: As directed       Allergies as of 02/20/2021       Reactions   Celebrex [celecoxib] Anaphylaxis   Glipizide Anaphylaxis   Levaquin [levofloxacin In D5w] Other (See Comments)   Heart arrhthymias   Levofloxacin Other (See Comments), Palpitations   ICD fired   Lisinopril Swelling   Lip and facial swelling   Sulfa Antibiotics Other (See Comments), Anaphylaxis   Reaction: unknown   Metformin Other (See Comments)   Gi tolerance    Penicillins Rash, Other (See Comments)   Has patient had a PCN reaction causing immediate rash, facial/tongue/throat swelling, SOB or lightheadedness with hypotension: Unknown Has patient had a PCN reaction causing severe rash involving mucus membranes or skin necrosis: No Has patient had a PCN reaction that required hospitalization: No Has patient had a PCN reaction occurring within the last 10 years: No If all of the above answers are "NO", then may proceed with Cephalosporin use.        Medication List     STOP taking these medications    HYDROcodone-acetaminophen 5-325 MG tablet Commonly known as: NORCO/VICODIN   potassium chloride SA 20 MEQ tablet Commonly known as: KLOR-CON   prenatal multivitamin Tabs tablet   zolpidem 5 MG tablet Commonly known as: AMBIEN       TAKE these medications    allopurinol 100 MG tablet Commonly known as: ZYLOPRIM Take 200 mg by mouth daily.   aspirin EC 81 MG tablet Take 81 mg by mouth at bedtime.   dexamethasone 4 MG tablet Commonly known as: DECADRON Take 5 tablets (20 mg total) by mouth daily. Take the day after Velcade on days 2,5,9,12. Take with breakfast   docusate sodium 100 MG capsule Commonly known as: COLACE Take 100 mg by mouth 2 (two) times daily.   Ensure Max Protein  Liqd Take 330 mLs (11 oz total) by mouth 2 (two) times daily between meals.   fexofenadine 180 MG tablet Commonly known as: ALLEGRA Take 180 mg by mouth daily.   FLUoxetine 10 MG capsule Commonly known as: PROZAC Take 10 mg by mouth daily.   gabapentin 600 MG tablet Commonly known as: NEURONTIN Take 600 mg by mouth daily. Patient takes 100 mg QAM, 700 mg QHS What changed: Another medication with the same name was changed. Make sure you understand how and when to take each.   gabapentin 100 MG capsule Commonly known as: NEURONTIN TAKE 1 CAPSULE(100 MG) BY MOUTH THREE TIMES DAILY What changed: See the new instructions.   hydrocortisone 2.5 % rectal cream Commonly known as: ANUSOL-HC Apply 1 application topically 2 (two) times daily.   Lactulose 20 GM/30ML Soln Take 30 mLs (20 g total) by mouth in the morning and at bedtime.   levothyroxine 75 MCG tablet Commonly known as: SYNTHROID Take 1 tablet (75 mcg total) by mouth daily before breakfast. On Monday, Wednesday and Friday   levothyroxine 50 MCG tablet Commonly known as: SYNTHROID Take 50 mcg by mouth daily before  breakfast.   magnesium oxide 400 MG tablet Commonly known as: MAG-OX Take 200 mg by mouth daily.   mexiletine 200 MG capsule Commonly known as: MEXITIL Take 1 capsule (200 mg total) by mouth every 12 (twelve) hours.   midodrine 10 MG tablet Commonly known as: PROAMATINE Take 1 tablet (10 mg total) by mouth 3 (three) times daily. What changed: when to take this   OXYGEN Inhale 2 L into the lungs continuous.   pantoprazole 40 MG tablet Commonly known as: PROTONIX Take 40 mg by mouth daily.   polyethylene glycol 17 g packet Commonly known as: MiraLax Take 17 g by mouth daily.   PROBIOTIC DAILY PO Take 1 tablet by mouth daily.   Santyl ointment Generic drug: collagenase Apply topically.   simvastatin 40 MG tablet Commonly known as: ZOCOR Take 20 mg by mouth at bedtime.   spironolactone 25 MG  tablet Commonly known as: ALDACTONE Take 0.5 tablets (12.5 mg total) by mouth every other day. What changed:  how much to take when to take this   torsemide 20 MG tablet Commonly known as: DEMADEX Take 40 mg by mouth daily.   traZODone 50 MG tablet Commonly known as: DESYREL Take 50 mg by mouth at bedtime.        Allergies  Allergen Reactions   Celebrex [Celecoxib] Anaphylaxis   Glipizide Anaphylaxis   Levaquin [Levofloxacin In D5w] Other (See Comments)    Heart arrhthymias   Levofloxacin Other (See Comments) and Palpitations    ICD fired   Lisinopril Swelling    Lip and facial swelling   Sulfa Antibiotics Other (See Comments) and Anaphylaxis    Reaction: unknown   Metformin Other (See Comments)    Gi tolerance    Penicillins Rash and Other (See Comments)    Has patient had a PCN reaction causing immediate rash, facial/tongue/throat swelling, SOB or lightheadedness with hypotension: Unknown Has patient had a PCN reaction causing severe rash involving mucus membranes or skin necrosis: No Has patient had a PCN reaction that required hospitalization: No Has patient had a PCN reaction occurring within the last 10 years: No If all of the above answers are "NO", then may proceed with Cephalosporin use.     Consultations: Oncology   Procedures/Studies: CT Abdomen Pelvis Wo Contrast  Result Date: 02/12/2021 CLINICAL DATA:  Generalized weakness and pain. History of multiple myeloma. Recent fall. EXAM: CT ABDOMEN AND PELVIS WITHOUT CONTRAST TECHNIQUE: Multidetector CT imaging of the abdomen and pelvis was performed following the standard protocol without IV contrast. COMPARISON:  CT abdomen and pelvis 06/18/2020 FINDINGS: Lower chest: Stable cardiomegaly. Hypodense blood pool, suggestive of edema anemia. Partially visualized pacemaker lead in the right ventricle. Coronary artery stent. No abnormal pericardial fluid. Scattered atherosclerosis of the descending thoracic aorta.  Visualized lung bases are clear. Hepatobiliary: Mild surface nodularity. No focal liver abnormality is seen. Status post cholecystectomy. No biliary dilatation. Pancreas: Diffuse mild parenchymal atrophy and fatty infiltration. The main pancreatic duct is not dilated. No peripancreatic fluid collections. Spleen: Normal in size. A few calcifications are noted in the lateral aspect of the spleen, similar to prior studies. Adrenals/Urinary Tract: Stable low-attenuation (Hounsfield units -17 and -19) bilateral adrenal masses consistent with adrenal adenomas, unchanged compared to 03/02/2019. Stable cortical scarring at the posterior upper poles of the kidneys. No hydronephrosis. Bilateral nonobstructive nephrolithiasis also unchanged compared to 03/02/2019. Stomach/Bowel: Stomach is within normal limits. The appendix is not directly visualized, however no pericecal inflammatory changes. Diffuse diverticulosis involving the descending  and sigmoid colon. No evidence of bowel wall thickening, distention, or inflammatory changes. Vascular/Lymphatic: Severe calcific atherosclerosis involving the abdominal aorta and common iliac arteries. Severe focal narrowing in the infrarenal abdominal aorta as well as the proximal left common iliac artery appear similar to 03/01/2021 the evaluation is limited due to lack of intravenous contrast. Scattered calcific atherosclerosis of the external iliac arteries. No lymphadenopathy in the abdomen or pelvis. Reproductive: Uterus and bilateral adnexa are unremarkable. Other: No abdominal wall hernia or abnormality. No abdominopelvic ascites. Musculoskeletal: Right hip prosthesis is intact. Multilevel degenerative disc disease in the visualized distal thoracic and lumbar spine, most severe at L4-L5 with vacuum disc phenomenon. No new acute or significant osseous finding. IMPRESSION: 1. No acute abnormality in the abdomen or pelvis. 2. Mild cirrhotic changes of the liver, likely secondary to  CHF. 3. Diverticulosis of the descending and sigmoid colon without findings of diverticulitis. 4. Stable bilateral adrenal adenomas. 5. Aortic Atherosclerosis (ICD10-I70.0), severe. Electronically Signed   By: Ileana Roup M.D.   On: 02/12/2021 09:50   CT Head Wo Contrast  Result Date: 02/12/2021 CLINICAL DATA:  Generalized weakness and pain. Patient receives injections for multiple myeloma. There are multiple skin tears and bruises to both arms. Patient fell Saturday morning due to weakness. EXAM: CT HEAD WITHOUT CONTRAST CT CERVICAL SPINE WITHOUT CONTRAST TECHNIQUE: Multidetector CT imaging of the head and cervical spine was performed following the standard protocol without intravenous contrast. Multiplanar CT image reconstructions of the cervical spine were also generated. COMPARISON:  Prior exams, most recent head CT, 12/15/2020. FINDINGS: CT HEAD FINDINGS Brain: No evidence of acute infarction, hemorrhage, hydrocephalus, extra-axial collection or mass lesion/mass effect. Encephalomalacia of the right frontal lobe consistent with an old infarct. Patchy bilateral white matter hypoattenuation noted consistent with moderate chronic microvascular ischemic change. Ventricular and sulcal enlargement also noted consistent with mild diffuse atrophy. These findings are stable. Vascular: No hyperdense vessel or unexpected calcification. Skull: Normal. Negative for fracture or focal lesion. Sinuses/Orbits: Visualized globes and orbits are unremarkable. Visualized sinuses are clear. Other: None. CT CERVICAL SPINE FINDINGS Alignment: Slight reversal of the normal cervical lordosis. No spondylolisthesis. Skull base and vertebrae: No acute fracture. No primary bone lesion or focal pathologic process. Soft tissues and spinal canal: No prevertebral fluid or swelling. No visible canal hematoma. Disc levels: Mild loss of disc height at C3-C4. Remaining disc spaces are relatively well preserved. Facet degenerative change noted,  most evident on the right at C2-C3 and C3-C4. No significant disc bulging or convincing disc herniation. Upper chest: No acute or significant abnormality. Other: None. IMPRESSION: HEAD CT 1. No acute intracranial abnormalities. 2. Old right frontal lobe infarct. Mild atrophy and moderate chronic microvascular ischemic change. CERVICAL CT 1. No fracture or acute finding. Electronically Signed   By: Lajean Manes M.D.   On: 02/12/2021 09:32   CT Cervical Spine Wo Contrast  Result Date: 02/12/2021 CLINICAL DATA:  Generalized weakness and pain. Patient receives injections for multiple myeloma. There are multiple skin tears and bruises to both arms. Patient fell Saturday morning due to weakness. EXAM: CT HEAD WITHOUT CONTRAST CT CERVICAL SPINE WITHOUT CONTRAST TECHNIQUE: Multidetector CT imaging of the head and cervical spine was performed following the standard protocol without intravenous contrast. Multiplanar CT image reconstructions of the cervical spine were also generated. COMPARISON:  Prior exams, most recent head CT, 12/15/2020. FINDINGS: CT HEAD FINDINGS Brain: No evidence of acute infarction, hemorrhage, hydrocephalus, extra-axial collection or mass lesion/mass effect. Encephalomalacia of the right frontal  lobe consistent with an old infarct. Patchy bilateral white matter hypoattenuation noted consistent with moderate chronic microvascular ischemic change. Ventricular and sulcal enlargement also noted consistent with mild diffuse atrophy. These findings are stable. Vascular: No hyperdense vessel or unexpected calcification. Skull: Normal. Negative for fracture or focal lesion. Sinuses/Orbits: Visualized globes and orbits are unremarkable. Visualized sinuses are clear. Other: None. CT CERVICAL SPINE FINDINGS Alignment: Slight reversal of the normal cervical lordosis. No spondylolisthesis. Skull base and vertebrae: No acute fracture. No primary bone lesion or focal pathologic process. Soft tissues and spinal  canal: No prevertebral fluid or swelling. No visible canal hematoma. Disc levels: Mild loss of disc height at C3-C4. Remaining disc spaces are relatively well preserved. Facet degenerative change noted, most evident on the right at C2-C3 and C3-C4. No significant disc bulging or convincing disc herniation. Upper chest: No acute or significant abnormality. Other: None. IMPRESSION: HEAD CT 1. No acute intracranial abnormalities. 2. Old right frontal lobe infarct. Mild atrophy and moderate chronic microvascular ischemic change. CERVICAL CT 1. No fracture or acute finding. Electronically Signed   By: Lajean Manes M.D.   On: 02/12/2021 09:32   DG Chest Port 1 View  Result Date: 02/12/2021 CLINICAL DATA:  Questionable sepsis.  Weakness. EXAM: PORTABLE CHEST 1 VIEW COMPARISON:  06/25/2020 FINDINGS: Dual-chamber ICD leads from the left. Cardiomegaly. Coronary stenting. There is no edema, consolidation, effusion, or pneumothorax. IMPRESSION: No evidence of active disease.  Chronic cardiomegaly. Electronically Signed   By: Jorje Guild M.D.   On: 02/12/2021 06:55      Subjective: Seen and examined on the day of discharge.  Stable no distress.  Energy level improved.  Tolerated addition of diuretics.  Discharge Exam: Vitals:   02/20/21 0415 02/20/21 0744  BP: (!) 97/55 (!) 93/53  Pulse: 74 76  Resp: 18 18  Temp: 98.1 F (36.7 C) 97.9 F (36.6 C)  SpO2: 100% 99%   Vitals:   02/19/21 1531 02/19/21 1947 02/20/21 0415 02/20/21 0744  BP: (!) 92/51 (!) 91/50 (!) 97/55 (!) 93/53  Pulse: 79 72 74 76  Resp: 16 18 18 18   Temp: (!) 97.5 F (36.4 C) 98.7 F (37.1 C) 98.1 F (36.7 C) 97.9 F (36.6 C)  TempSrc: Oral  Oral Oral  SpO2: 100% 100% 100% 99%  Weight:      Height:        General: Pt is alert, awake, not in acute distress Cardiovascular: RRR, S1/S2 +, no rubs, no gallops Respiratory: CTA bilaterally, no wheezing, no rhonchi Abdominal: Soft, NT, ND, bowel sounds + Extremities: no edema,  no cyanosis    The results of significant diagnostics from this hospitalization (including imaging, microbiology, ancillary and laboratory) are listed below for reference.     Microbiology: Recent Results (from the past 240 hour(s))  Blood Culture (routine x 2)     Status: None   Collection Time: 02/12/21  6:48 AM   Specimen: BLOOD  Result Value Ref Range Status   Specimen Description BLOOD LEFT ARM  Final   Special Requests   Final    BOTTLES DRAWN AEROBIC AND ANAEROBIC Blood Culture adequate volume   Culture   Final    NO GROWTH 5 DAYS Performed at [facility], [address], [place], [place] [address]    Report Status 02/17/2021 FINAL  Final  Blood Culture (routine x 2)     Status: None   Collection Time: 02/12/21  6:48 AM   Specimen: BLOOD LEFT HAND  Result  Value Ref Range Status   Specimen Description BLOOD LEFT HAND  Final   Special Requests   Final    BOTTLES DRAWN AEROBIC ONLY Blood Culture adequate volume   Culture   Final    NO GROWTH 5 DAYS Performed at [facility] [address] [place], [place] [address]    Report Status 02/17/2021 FINAL  Final  Resp Panel by RT-PCR (Flu A&B, Covid) Nasopharyngeal Swab     Status: None   Collection Time: 02/12/21  7:20 AM   Specimen: Nasopharyngeal Swab; Nasopharyngeal(NP) swabs in vial transport medium  Result Value Ref Range Status   SARS Coronavirus 2 by RT PCR NEGATIVE NEGATIVE Final    Comment: (NOTE) SARS-CoV-2 target nucleic acids are NOT DETECTED.  The SARS-CoV-2 RNA is generally detectable in upper respiratory specimens during the acute phase of infection. The lowest concentration of SARS-CoV-2 viral copies this assay can detect is 138 copies/mL. A negative result does not preclude SARS-Cov-2 infection and should not be used as the sole basis for treatment or other patient management decisions. A negative result may occur with  improper specimen collection/handling, submission of specimen  other than nasopharyngeal swab, presence of viral mutation(s) within the areas targeted by this assay, and inadequate number of viral copies(<138 copies/mL). A negative result must be combined with clinical observations, patient history, and epidemiological information. The expected result is Negative.  Fact Sheet for Patients:  EntrepreneurPulse.com.au  Fact Sheet for Healthcare Providers:  IncredibleEmployment.be  This test is no t yet approved or cleared by the [place] FDA and  has been authorized for detection and/or diagnosis of SARS-CoV-2 by FDA under an Emergency Use Authorization (EUA). This EUA will remain  in effect (meaning this test can be used) for the duration of the COVID-19 declaration under Section 564(b)(1) of the Act, 21 U.S.C.section 360bbb-3(b)(1), unless the authorization is terminated  or revoked sooner.       Influenza A by PCR NEGATIVE NEGATIVE Final   Influenza B by PCR NEGATIVE NEGATIVE Final    Comment: (NOTE) The Xpert Xpress SARS-CoV-2/FLU/RSV plus assay is intended as an aid in the diagnosis of influenza from Nasopharyngeal swab specimens and should not be used as a sole basis for treatment. Nasal washings and aspirates are unacceptable for Xpert Xpress SARS-CoV-2/FLU/RSV testing.  Fact Sheet for Patients: EntrepreneurPulse.com.au  Fact Sheet for Healthcare Providers: IncredibleEmployment.be  This test is not yet approved or cleared by the [place] FDA and has been authorized for detection and/or diagnosis of SARS-CoV-2 by FDA under an Emergency Use Authorization (EUA). This EUA will remain in effect (meaning this test can be used) for the duration of the COVID-19 declaration under Section 564(b)(1) of the Act, 21 U.S.C. section 360bbb-3(b)(1), unless the authorization is terminated or revoked.  Performed at [facility], [address]  [place], [place] [address]   Urine Culture     Status: Abnormal   Collection Time: 02/12/21  1:30 PM   Specimen: Urine, Random  Result Value Ref Range Status   Specimen Description   Final    URINE, RANDOM Performed at [facility], [address], [place], [place] [address]    Special Requests   Final    NONE Performed at [facility], [address] [place], [place] [address]    Culture (A)  Final    <10,000 COLONIES/mL INSIGNIFICANT GROWTH Performed at [facility], [place] [address] [place], [place] [address]    Report Status 02/13/2021  FINAL  Final  KOH prep     Status: None   Collection Time: 02/15/21 10:54 AM   Specimen: Bronchial Brush  Result Value Ref Range Status   Specimen Description BRONCHIAL BRUSHING  Final   Special Requests NONE  Final   KOH Prep   Final    BUDDING YEAST SEEN Performed at [facility], [address] [place], [place] [address]    Report Status 02/15/2021 FINAL  Final     Labs: BNP (last 3 results) Recent Labs    03/28/20 1435  BNP 122.4*   Basic Metabolic Panel: Recent Labs  Lab 02/14/21 0352 02/15/21 0229 02/16/21 0609 02/17/21 0519 02/18/21 0408  NA 139 137 131* 129* 135  K 4.3 4.6 4.3 4.4 4.2  CL 109 107 100 97* 105  CO2 23 25 26 25 25   GLUCOSE 183* 121* 128* 138* 125*  BUN 65* 55* 36* 32* 32*  CREATININE 1.06* 1.06* 0.93 0.95 0.94  CALCIUM 8.7* 8.6* 8.4* 8.4* 8.2*  MG 2.5* 2.5* 2.4 2.2 2.0   Liver Function Tests: No results for input(s): AST, ALT, ALKPHOS, BILITOT, PROT, ALBUMIN in the last 168 hours. No results for input(s): LIPASE, AMYLASE in the last 168 hours. No results for input(s): AMMONIA in the last 168 hours. CBC: Recent Labs  Lab 02/14/21 0352 02/15/21 0229 02/16/21 0609 02/16/21 1701 02/17/21 0519 02/18/21 0408  WBC 18.1* 18.7*  19.1* 14.5*  --  15.1* 15.5*  NEUTROABS  --  14.8*  --   --   --   --   HGB 6.7* 7.6*  7.5* 7.2* 9.3* 8.9* 9.0*  HCT 21.9* 22.8*  22.9*  22.2* 27.7* 26.2* 26.8*  MCV 93.6 85.4  86.7 85.7  --  87.0 90.5  PLT 70* 118*  112* 140*  --  161 208   Cardiac Enzymes: No results for input(s): CKTOTAL, CKMB, CKMBINDEX, TROPONINI in the last 168 hours. BNP: Invalid input(s): POCBNP CBG: Recent Labs  Lab 02/19/21 0753 02/19/21 1121 02/19/21 1637 02/19/21 2046 02/20/21 0746  GLUCAP 118* 184* 155* 191* 195*   D-Dimer No results for input(s): DDIMER in the last 72 hours. Hgb A1c No results for input(s): HGBA1C in the last 72 hours. Lipid Profile No results for input(s): CHOL, HDL, LDLCALC, TRIG, CHOLHDL, LDLDIRECT in the last 72 hours. Thyroid function studies No results for input(s): TSH, T4TOTAL, T3FREE, THYROIDAB in the last 72 hours.  Invalid input(s): FREET3 Anemia work up No results for input(s): VITAMINB12, FOLATE, FERRITIN, TIBC, IRON, RETICCTPCT in the last 72 hours. Urinalysis    Component Value Date/Time   COLORURINE YELLOW (A) 02/12/2021 1330   APPEARANCEUR CLEAR (A) 02/12/2021 1330   LABSPEC 1.014 02/12/2021 1330   PHURINE 5.0 02/12/2021 1330   GLUCOSEU 50 (A) 02/12/2021 1330   HGBUR NEGATIVE 02/12/2021 1330   BILIRUBINUR NEGATIVE 02/12/2021 1330   KETONESUR NEGATIVE 02/12/2021 1330   PROTEINUR NEGATIVE 02/12/2021 1330   NITRITE NEGATIVE 02/12/2021 1330   LEUKOCYTESUR MODERATE (A) 02/12/2021 1330   Sepsis Labs Invalid input(s): PROCALCITONIN,  WBC,  LACTICIDVEN Microbiology Recent Results (from the past 240 hour(s))  Blood Culture (routine x 2)     Status: None   Collection Time: 02/12/21  6:48 AM   Specimen: BLOOD  Result Value Ref Range Status   Specimen Description BLOOD LEFT ARM  Final   Special Requests   Final    BOTTLES DRAWN AEROBIC AND ANAEROBIC Blood Culture adequate volume   Culture   Final    NO  GROWTH 5 DAYS Performed at [facility], [address] [place], [place] [address]    Report Status 02/17/2021 FINAL  Final  Blood Culture (routine x 2)     Status: None    Collection Time: 02/12/21  6:48 AM   Specimen: BLOOD LEFT HAND  Result Value Ref Range Status   Specimen Description BLOOD LEFT HAND  Final   Special Requests   Final    BOTTLES DRAWN AEROBIC ONLY Blood Culture adequate volume   Culture   Final    NO GROWTH 5 DAYS Performed at [facility], [address] [place], [place] [address]    Report Status 02/17/2021 FINAL  Final  Resp Panel by RT-PCR (Flu A&B, Covid) Nasopharyngeal Swab     Status: None   Collection Time: 02/12/21  7:20 AM   Specimen: Nasopharyngeal Swab; Nasopharyngeal(NP) swabs in vial transport medium  Result Value Ref Range Status   SARS Coronavirus 2 by RT PCR NEGATIVE NEGATIVE Final    Comment: (NOTE) SARS-CoV-2 target nucleic acids are NOT DETECTED.  The SARS-CoV-2 RNA is generally detectable in upper respiratory specimens during the acute phase of infection. The lowest concentration of SARS-CoV-2 viral copies this assay can detect is 138 copies/mL. A negative result does not preclude SARS-Cov-2 infection and should not be used as the sole basis for treatment or other patient management decisions. A negative result may occur with  improper specimen collection/handling, submission of specimen other than nasopharyngeal swab, presence of viral mutation(s) within the areas targeted by this assay, and inadequate number of viral copies(<138 copies/mL). A negative result must be combined with clinical observations, patient history, and epidemiological information. The expected result is Negative.  Fact Sheet for Patients:  EntrepreneurPulse.com.au  Fact Sheet for Healthcare Providers:  IncredibleEmployment.be  This test is no t yet approved or cleared by the [place] FDA and  has been authorized for detection and/or diagnosis of SARS-CoV-2 by FDA under an Emergency Use Authorization (EUA). This EUA will remain  in effect (meaning this test can be used) for the  duration of the COVID-19 declaration under Section 564(b)(1) of the Act, 21 U.S.C.section 360bbb-3(b)(1), unless the authorization is terminated  or revoked sooner.       Influenza A by PCR NEGATIVE NEGATIVE Final   Influenza B by PCR NEGATIVE NEGATIVE Final    Comment: (NOTE) The Xpert Xpress SARS-CoV-2/FLU/RSV plus assay is intended as an aid in the diagnosis of influenza from Nasopharyngeal swab specimens and should not be used as a sole basis for treatment. Nasal washings and aspirates are unacceptable for Xpert Xpress SARS-CoV-2/FLU/RSV testing.  Fact Sheet for Patients: EntrepreneurPulse.com.au  Fact Sheet for Healthcare Providers: IncredibleEmployment.be  This test is not yet approved or cleared by the [place] FDA and has been authorized for detection and/or diagnosis of SARS-CoV-2 by FDA under an Emergency Use Authorization (EUA). This EUA will remain in effect (meaning this test can be used) for the duration of the COVID-19 declaration under Section 564(b)(1) of the Act, 21 U.S.C. section 360bbb-3(b)(1), unless the authorization is terminated or revoked.  Performed at [facility], [address] [place], [place] [address]   Urine Culture     Status: Abnormal   Collection Time: 02/12/21  1:30 PM   Specimen: Urine, Random  Result Value Ref Range Status   Specimen Description   Final    URINE, RANDOM Performed at [facility], [address] [place], [place] [address]    Special Requests  Final    NONE Performed at [facility], [address] [place], [place] [address]    Culture (A)  Final    <10,000 COLONIES/mL INSIGNIFICANT GROWTH Performed at [facility] [address], [place], [place] [address]    Report Status 02/13/2021 FINAL  Final  KOH prep     Status: None   Collection Time: 02/15/21 10:54 AM   Specimen: Bronchial Brush  Result Value Ref Range Status   Specimen  Description BRONCHIAL BRUSHING  Final   Special Requests NONE  Final   KOH Prep   Final    BUDDING YEAST SEEN Performed at [facility], [address], [place], [place] [address]    Report Status 02/15/2021 FINAL  Final     Time coordinating discharge: Over 30 minutes  SIGNED:   Sidney Ace, MD  [facility] 02/20/2021, 10:37 AM Pager   If 7PM-7AM, please contact night-coverage

## 2021-02-20 NOTE — Progress Notes (Signed)
AVS reviewed and pt verbalized understanding of all instructions. NAD noted @ this time and pt transported via wheelchair for discharge home.

## 2021-02-20 NOTE — TOC Transition Note (Signed)
Transition of Care [facility]) - CM/SW Discharge Note   Patient Details  Name: Heather Peterson MRN: [id number] Date of Birth: [birth year]  Transition of Care [facility]) CM/SW Contact:  Beverly Sessions, RN Phone Number: 02/20/2021, 11:40 AM   Clinical Narrative:      Patient to discharge today Reported husband to transport [name] with [place] notified of discharge   Final next level of care: [place] Barriers to Discharge: Barriers Resolved   Patient Goals and CMS Choice Patient states their goals for this hospitalization and ongoing recovery are:: home with resumed home health services CMS Medicare.gov Compare Post Acute Care list provided to:: Patient Choice offered to / list presented to : Patient  Discharge Placement                       Discharge Plan and Services                          HH Arranged: PT, OT, RN, Nurse's Aide [facility] Agency: [place] Date [place]: 02/20/21   Representative spoke with at [place]: [name]  Social Determinants of Health ([place]) Interventions     Readmission Risk Interventions Readmission Risk Prevention Plan 02/20/2021 02/16/2021 09/06/2019  Transportation Screening Complete Complete Complete  Medication Review Press photographer) Complete Complete Complete  PCP or Specialist appointment within 3-5 days of discharge - Complete Complete  HRI or [place] Complete Complete Complete  SW Recovery Care/Counseling Consult Complete Complete Complete  Palliative Care Screening Not Applicable Not Applicable Not [place] Patient Refused Complete Not Applicable  Some recent data might be hidden

## 2021-02-20 NOTE — Progress Notes (Signed)
Mobility Specialist - Progress Note   02/20/21 1100  Mobility  Activity Transferred:  Bed to chair;Dangled on edge of bed;Sat and stood x 3  Range of Motion/Exercises All extremities  Level of Assistance Minimal assist, patient does 75% or more  Assistive Device Front wheel walker  Distance Ambulated (ft) 4 ft  Mobility Out of bed to chair with meals;Ambulated with assistance in room  Mobility Response Tolerated well  Mobility performed by Mobility specialist  $Mobility charge 1 Mobility    During mobility: 88 HR, 88% SpO2 Post-mobility: 83 HR, 97% SpO2   Pt lying in bed upon arrival, utilizing 2L. Pt sat EOB with modA. Extra time needed for all tasks. Able to doff gown and perform UB dressing with supervision. MinA for LB dressing. Pt does present with posterior lean while seated, LOBx1 while EOB. Does require minA for balance with seated tasks. Pt stood to RW, noticed wet sheets, assistance for peri-care. Ambulated to chair with supervision. O2 >/= 88% on RA, returned to 2L prior to exit. Alarm set. Anticipating d/c this date.    Kathee Delton Mobility Specialist 02/20/21, 11:38 AM

## 2021-02-23 ENCOUNTER — Inpatient Hospital Stay: Payer: Medicare Other

## 2021-02-26 NOTE — Progress Notes (Signed)
[place]  [phone] [phone]  ID: Media Pizzini Busick OB: Jul 16, 1940  MR#: [id number]  DTO#:[id number]  [name], MD as PCP - General (Family Medicine) Lloyd Huger, MD as Consulting Physician (Oncology)  CHIEF COMPLAINT: Multiple myeloma, subdural hematoma.  INTERVAL HISTORY: Patient returns to clinic today for further evaluation, hospital follow-up, and consideration of additional blood.  She continues to have significant weakness and fatigue.  Her peripheral edema and CHF symptoms have essentially resolved. She does not complain of any neuropathy today.  She has no neurologic complaints.  She has chronic shortness of breath and requires oxygen 24 hours/day.  She denies any fevers.  She has a fair appetite.  She denies any chest pain, cough, or hemoptysis. She denies any nausea, vomiting, constipation or diarrhea.  She has no further abdominal pain.  She denies any melena or hematochezia.  She has no urinary complaints.  Patient feels generally terrible, but is no further specific complaints today.    REVIEW OF SYSTEMS:   Review of Systems  Constitutional:  Positive for malaise/fatigue. Negative for fever.  Respiratory:  Positive for shortness of breath. Negative for cough and hemoptysis.   Cardiovascular: Negative.  Negative for chest pain and leg swelling.  Gastrointestinal: Negative.  Negative for abdominal pain, constipation, diarrhea, melena and nausea.  Genitourinary: Negative.  Negative for dysuria and hematuria.  Musculoskeletal:  Positive for back pain. Negative for joint pain.  Skin: Negative.  Negative for rash.  Neurological:  Positive for weakness. Negative for dizziness, sensory change, focal weakness and headaches.  Psychiatric/Behavioral: Negative.  The patient is not nervous/anxious.    As per HPI. Otherwise, a complete review of systems is negative.  PAST MEDICAL HISTORY: Past Medical  History:  Diagnosis Date   Anxiety    Chronic combined systolic and diastolic CHF (congestive heart failure) (HCC)    Chronic kidney disease    Coronary artery disease    Depression    Diabetes mellitus without complication (HCC)    Diabetes mellitus, type II ([place])    Hypertension    MI (myocardial infarction) ([place])    x 5   Pacemaker    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[place])    Prolonged Q-T interval on ECG    Thyroid disease     PAST SURGICAL HISTORY: Past Surgical History:  Procedure Laterality Date   CENTRAL LINE INSERTION  03/11/2017   Procedure: CENTRAL LINE INSERTION;  Surgeon: Leonie Man, MD;  Location: [place] [facility];  Service: Cardiovascular;;   CHOLECYSTECTOMY     COLONOSCOPY WITH PROPOFOL N/A 09/01/2019   Procedure: COLONOSCOPY WITH PROPOFOL;  Surgeon: Toledo, Benay Pike, MD;  Location: [facility];  Service: Gastroenterology;  Laterality: N/A;   CORONARY STENT INTERVENTION W/IMPELLA N/A 03/11/2017   Procedure: Coronary Stent Intervention w/Impella;  Surgeon: Leonie Man, MD;  Location: [place] [facility];  Service: Cardiovascular;  Laterality: N/A;   ESOPHAGOGASTRODUODENOSCOPY (EGD) WITH PROPOFOL N/A 09/01/2019   Procedure: ESOPHAGOGASTRODUODENOSCOPY (EGD) WITH PROPOFOL;  Surgeon: Toledo, Benay Pike, MD;  Location: [facility] ENDOSCOPY;  Service: Gastroenterology;  Laterality: N/A;   ESOPHAGOGASTRODUODENOSCOPY (EGD) WITH PROPOFOL N/A 09/08/2019   Procedure: ESOPHAGOGASTRODUODENOSCOPY (EGD) WITH PROPOFOL;  Surgeon: [name], MD;  Location: [facility] ENDOSCOPY;  Service: Gastroenterology;  Laterality: N/A;   ESOPHAGOGASTRODUODENOSCOPY (EGD) WITH PROPOFOL N/A 02/15/2021   Procedure: ESOPHAGOGASTRODUODENOSCOPY (EGD) WITH PROPOFOL;  Surgeon: [name], MD;  Location: [facility] ENDOSCOPY;  Service: Gastroenterology;  Laterality: N/A;   EYE SURGERY  HIP ARTHROPLASTY Right 03/29/2020   Procedure: ARTHROPLASTY BIPOLAR HIP (HEMIARTHROPLASTY);  Surgeon: Corky Mull, MD;   Location: [facility] ORS;  Service: Orthopedics;  Laterality: Right;   INTRAVASCULAR PRESSURE WIRE/FFR STUDY N/A 03/11/2017   Procedure: INTRAVASCULAR PRESSURE WIRE/FFR STUDY;  Surgeon: Leonie Man, MD;  Location: [place] [facility];  Service: Cardiovascular;  Laterality: N/A;   INTRAVASCULAR ULTRASOUND/IVUS N/A 03/11/2017   Procedure: Intravascular Ultrasound/IVUS;  Surgeon: Leonie Man, MD;  Location: [place] [facility];  Service: Cardiovascular;  Laterality: N/A;   LEFT HEART CATH AND CORONARY ANGIOGRAPHY N/A 03/05/2017   Procedure: LEFT HEART CATH AND CORONARY ANGIOGRAPHY;  Surgeon: Isaias Cowman, MD;  Location: [place] [facility];  Service: Cardiovascular;  Laterality: N/A;   PACEMAKER IMPLANT     pacemaker/defibrillator Left     FAMILY HISTORY: Family History  Problem Relation Age of Onset   Hypertension Father    Heart attack Father    Depression Sister    Depression Brother    Depression Brother     ADVANCED DIRECTIVES (Y/N):  N  HEALTH MAINTENANCE: Social History   Tobacco Use   Smoking status: Every Day    Packs/day: 0.25    Types: E-cigarettes, Cigarettes   Smokeless tobacco: Never  Vaping Use   Vaping Use: Former  Substance Use Topics   Alcohol use: Not Currently    Comment: occasionally   Drug use: Yes    Comment: prescribed pain meds     Colonoscopy:  PAP:  Bone density:  Lipid panel:  Allergies  Allergen Reactions   Celebrex [Celecoxib] Anaphylaxis   Glipizide Anaphylaxis   Levaquin [Levofloxacin In D5w] Other (See Comments)    Heart arrhthymias   Levofloxacin Other (See Comments) and Palpitations    ICD fired   Lisinopril Swelling    Lip and facial swelling   Sulfa Antibiotics Other (See Comments) and Anaphylaxis    Reaction: unknown   Metformin Other (See Comments)    Gi tolerance    Penicillins Rash and Other (See Comments)    Has patient had a PCN reaction causing immediate rash, facial/tongue/throat swelling, SOB or  lightheadedness with hypotension: Unknown Has patient had a PCN reaction causing severe rash involving mucus membranes or skin necrosis: No Has patient had a PCN reaction that required hospitalization: No Has patient had a PCN reaction occurring within the last 10 years: No If all of the above answers are "NO", then may proceed with Cephalosporin use.     Current Outpatient Medications  Medication Sig Dispense Refill   allopurinol (ZYLOPRIM) 100 MG tablet Take 200 mg by mouth daily.     aspirin EC 81 MG tablet Take 81 mg by mouth at bedtime.      dexamethasone (DECADRON) 4 MG tablet Take 5 tablets (20 mg total) by mouth daily. Take the day after Velcade on days 2,5,9,12. Take with breakfast 40 tablet 3   docusate sodium (COLACE) 100 MG capsule Take 100 mg by mouth 2 (two) times daily.     Ensure Max Protein (ENSURE MAX PROTEIN) LIQD Take 330 mLs (11 oz total) by mouth 2 (two) times daily between meals.     fexofenadine (ALLEGRA) 180 MG tablet Take 180 mg by mouth daily.     FLUoxetine (PROZAC) 10 MG capsule Take 10 mg by mouth daily.     gabapentin (NEURONTIN) 100 MG capsule TAKE 1 CAPSULE(100 MG) BY MOUTH THREE TIMES DAILY (Patient taking differently: Patient reports 100 mg QAM, 700 mg QHS) 270 capsule  1   gabapentin (NEURONTIN) 600 MG tablet Take 600 mg by mouth daily. Patient takes 100 mg QAM, 700 mg QHS     hydrocortisone (ANUSOL-HC) 2.5 % rectal cream Apply 1 application topically 2 (two) times daily.     Lactulose 20 GM/30ML SOLN Take 30 mLs (20 g total) by mouth in the morning and at bedtime. 450 mL 1   levothyroxine (SYNTHROID) 50 MCG tablet Take 50 mcg by mouth daily before breakfast.     levothyroxine (SYNTHROID) 75 MCG tablet Take 1 tablet (75 mcg total) by mouth daily before breakfast. On Monday, Wednesday and Friday     magnesium oxide (MAG-OX) 400 MG tablet Take 200 mg by mouth daily.     mexiletine (MEXITIL) 200 MG capsule Take 1 capsule (200 mg total) by mouth every 12  (twelve) hours. 60 capsule 1   midodrine (PROAMATINE) 10 MG tablet Take 1 tablet (10 mg total) by mouth 3 (three) times daily. 90 tablet 0   OXYGEN Inhale 2 L into the lungs continuous.     pantoprazole (PROTONIX) 40 MG tablet Take 40 mg by mouth daily.      polyethylene glycol (MIRALAX) 17 g packet Take 17 g by mouth daily. 14 each 0   Probiotic Product (PROBIOTIC DAILY PO) Take 1 tablet by mouth daily.     SANTYL ointment Apply topically.     simvastatin (ZOCOR) 40 MG tablet Take 20 mg by mouth at bedtime.   2   spironolactone (ALDACTONE) 25 MG tablet Take 0.5 tablets (12.5 mg total) by mouth every other day. (Patient taking differently: Take 25 mg by mouth daily.) 15 tablet 3   torsemide (DEMADEX) 20 MG tablet Take 40 mg by mouth daily.     traZODone (DESYREL) 50 MG tablet Take 50 mg by mouth at bedtime.     No current facility-administered medications for this visit.    OBJECTIVE: Vitals:   02/28/21 0946  BP: 110/61  Pulse: 82  Resp: 16  Temp: 97.9 F (36.6 C)  SpO2: 98%     Body mass index is 29.23 kg/m.    ECOG FS:2 - Symptomatic, <50% confined to bed  General: Well-developed, well-nourished, no acute distress.  Sitting in a wheelchair. Eyes: Pink conjunctiva, anicteric sclera. HEENT: Normocephalic, moist mucous membranes. Lungs: No audible wheezing or coughing. Heart: Regular rate and rhythm. Abdomen: Soft, nontender, no obvious distention. Musculoskeletal: No edema, cyanosis, or clubbing. Neuro: Alert, answering all questions appropriately. Cranial nerves grossly intact. Skin: No rashes or petechiae noted. Psych: Flat affect.   LAB RESULTS:  Lab Results  Component Value Date   NA 134 (L) 02/28/2021   K 3.6 02/28/2021   CL 93 (L) 02/28/2021   CO2 30 02/28/2021   GLUCOSE 154 (H) 02/28/2021   BUN 39 (H) 02/28/2021   CREATININE 1.50 (H) 02/28/2021   CALCIUM 9.2 02/28/2021   PROT 7.2 02/28/2021   ALBUMIN 3.9 02/28/2021   AST 24 02/28/2021   ALT 31 02/28/2021    ALKPHOS 149 (H) 02/28/2021   BILITOT 0.7 02/28/2021   GFRNONAA 35 (L) 02/28/2021   GFRAA 30 (L) 09/09/2019    Lab Results  Component Value Date   WBC 8.3 02/28/2021   NEUTROABS 6.3 02/28/2021   HGB 8.7 (L) 02/28/2021   HCT 28.7 (L) 02/28/2021   MCV 94.7 02/28/2021   PLT 205 02/28/2021   Lab Results  Component Value Date   IRON 43 02/12/2021   TIBC 570 (H) 02/12/2021   IRONPCTSAT 8 (L)  02/12/2021   Lab Results  Component Value Date   FERRITIN 50 05/23/2020     STUDIES: CT Abdomen Pelvis Wo Contrast  Result Date: 02/12/2021 CLINICAL DATA:  Generalized weakness and pain. History of multiple myeloma. Recent fall. EXAM: CT ABDOMEN AND PELVIS WITHOUT CONTRAST TECHNIQUE: Multidetector CT imaging of the abdomen and pelvis was performed following the standard protocol without IV contrast. COMPARISON:  CT abdomen and pelvis 06/18/2020 FINDINGS: Lower chest: Stable cardiomegaly. Hypodense blood pool, suggestive of edema anemia. Partially visualized pacemaker lead in the right ventricle. Coronary artery stent. No abnormal pericardial fluid. Scattered atherosclerosis of the descending thoracic aorta. Visualized lung bases are clear. Hepatobiliary: Mild surface nodularity. No focal liver abnormality is seen. Status post cholecystectomy. No biliary dilatation. Pancreas: Diffuse mild parenchymal atrophy and fatty infiltration. The main pancreatic duct is not dilated. No peripancreatic fluid collections. Spleen: Normal in size. A few calcifications are noted in the lateral aspect of the spleen, similar to prior studies. Adrenals/Urinary Tract: Stable low-attenuation (Hounsfield units -17 and -19) bilateral adrenal masses consistent with adrenal adenomas, unchanged compared to 03/02/2019. Stable cortical scarring at the posterior upper poles of the kidneys. No hydronephrosis. Bilateral nonobstructive nephrolithiasis also unchanged compared to 03/02/2019. Stomach/Bowel: Stomach is within normal limits.  The appendix is not directly visualized, however no pericecal inflammatory changes. Diffuse diverticulosis involving the descending and sigmoid colon. No evidence of bowel wall thickening, distention, or inflammatory changes. Vascular/Lymphatic: Severe calcific atherosclerosis involving the abdominal aorta and common iliac arteries. Severe focal narrowing in the infrarenal abdominal aorta as well as the proximal left common iliac artery appear similar to 03/01/2021 the evaluation is limited due to lack of intravenous contrast. Scattered calcific atherosclerosis of the external iliac arteries. No lymphadenopathy in the abdomen or pelvis. Reproductive: Uterus and bilateral adnexa are unremarkable. Other: No abdominal wall hernia or abnormality. No abdominopelvic ascites. Musculoskeletal: Right hip prosthesis is intact. Multilevel degenerative disc disease in the visualized distal thoracic and lumbar spine, most severe at L4-L5 with vacuum disc phenomenon. No new acute or significant osseous finding. IMPRESSION: 1. No acute abnormality in the abdomen or pelvis. 2. Mild cirrhotic changes of the liver, likely secondary to CHF. 3. Diverticulosis of the descending and sigmoid colon without findings of diverticulitis. 4. Stable bilateral adrenal adenomas. 5. Aortic Atherosclerosis (ICD10-I70.0), severe. Electronically Signed   By: Ileana Roup M.D.   On: 02/12/2021 09:50   CT Head Wo Contrast  Result Date: 02/12/2021 CLINICAL DATA:  Generalized weakness and pain. Patient receives injections for multiple myeloma. There are multiple skin tears and bruises to both arms. Patient fell Saturday morning due to weakness. EXAM: CT HEAD WITHOUT CONTRAST CT CERVICAL SPINE WITHOUT CONTRAST TECHNIQUE: Multidetector CT imaging of the head and cervical spine was performed following the standard protocol without intravenous contrast. Multiplanar CT image reconstructions of the cervical spine were also generated. COMPARISON:  Prior  exams, most recent head CT, 12/15/2020. FINDINGS: CT HEAD FINDINGS Brain: No evidence of acute infarction, hemorrhage, hydrocephalus, extra-axial collection or mass lesion/mass effect. Encephalomalacia of the right frontal lobe consistent with an old infarct. Patchy bilateral white matter hypoattenuation noted consistent with moderate chronic microvascular ischemic change. Ventricular and sulcal enlargement also noted consistent with mild diffuse atrophy. These findings are stable. Vascular: No hyperdense vessel or unexpected calcification. Skull: Normal. Negative for fracture or focal lesion. Sinuses/Orbits: Visualized globes and orbits are unremarkable. Visualized sinuses are clear. Other: None. CT CERVICAL SPINE FINDINGS Alignment: Slight reversal of the normal cervical lordosis. No spondylolisthesis. Skull base  and vertebrae: No acute fracture. No primary bone lesion or focal pathologic process. Soft tissues and spinal canal: No prevertebral fluid or swelling. No visible canal hematoma. Disc levels: Mild loss of disc height at C3-C4. Remaining disc spaces are relatively well preserved. Facet degenerative change noted, most evident on the right at C2-C3 and C3-C4. No significant disc bulging or convincing disc herniation. Upper chest: No acute or significant abnormality. Other: None. IMPRESSION: HEAD CT 1. No acute intracranial abnormalities. 2. Old right frontal lobe infarct. Mild atrophy and moderate chronic microvascular ischemic change. CERVICAL CT 1. No fracture or acute finding. Electronically Signed   By: Lajean Manes M.D.   On: 02/12/2021 09:32   CT Cervical Spine Wo Contrast  Result Date: 02/12/2021 CLINICAL DATA:  Generalized weakness and pain. Patient receives injections for multiple myeloma. There are multiple skin tears and bruises to both arms. Patient fell Saturday morning due to weakness. EXAM: CT HEAD WITHOUT CONTRAST CT CERVICAL SPINE WITHOUT CONTRAST TECHNIQUE: Multidetector CT imaging of  the head and cervical spine was performed following the standard protocol without intravenous contrast. Multiplanar CT image reconstructions of the cervical spine were also generated. COMPARISON:  Prior exams, most recent head CT, 12/15/2020. FINDINGS: CT HEAD FINDINGS Brain: No evidence of acute infarction, hemorrhage, hydrocephalus, extra-axial collection or mass lesion/mass effect. Encephalomalacia of the right frontal lobe consistent with an old infarct. Patchy bilateral white matter hypoattenuation noted consistent with moderate chronic microvascular ischemic change. Ventricular and sulcal enlargement also noted consistent with mild diffuse atrophy. These findings are stable. Vascular: No hyperdense vessel or unexpected calcification. Skull: Normal. Negative for fracture or focal lesion. Sinuses/Orbits: Visualized globes and orbits are unremarkable. Visualized sinuses are clear. Other: None. CT CERVICAL SPINE FINDINGS Alignment: Slight reversal of the normal cervical lordosis. No spondylolisthesis. Skull base and vertebrae: No acute fracture. No primary bone lesion or focal pathologic process. Soft tissues and spinal canal: No prevertebral fluid or swelling. No visible canal hematoma. Disc levels: Mild loss of disc height at C3-C4. Remaining disc spaces are relatively well preserved. Facet degenerative change noted, most evident on the right at C2-C3 and C3-C4. No significant disc bulging or convincing disc herniation. Upper chest: No acute or significant abnormality. Other: None. IMPRESSION: HEAD CT 1. No acute intracranial abnormalities. 2. Old right frontal lobe infarct. Mild atrophy and moderate chronic microvascular ischemic change. CERVICAL CT 1. No fracture or acute finding. Electronically Signed   By: Lajean Manes M.D.   On: 02/12/2021 09:32   DG Chest Port 1 View  Result Date: 02/12/2021 CLINICAL DATA:  Questionable sepsis.  Weakness. EXAM: PORTABLE CHEST 1 VIEW COMPARISON:  06/25/2020 FINDINGS:  Dual-chamber ICD leads from the left. Cardiomegaly. Coronary stenting. There is no edema, consolidation, effusion, or pneumothorax. IMPRESSION: No evidence of active disease.  Chronic cardiomegaly. Electronically Signed   By: Jorje Guild M.D.   On: 02/12/2021 06:55    ASSESSMENT: Stage II multiple myeloma, fractured right hip.  PLAN:    1.  Stage II multiple myeloma: Bone marrow biopsy confirmed diagnosis with plasma cells up to 90% of biopsy.  Cytogenetics are reported as normal.  Metastatic bone survey from March 03, 2020 reviewed independently with multiple skeletal lucencies consistent with myeloma.  Patient's most recent M spike on January 09, 2021 increased slightly to 0.6 with an IgA immunoglobulin level of 966.  IgG and IgM are chronically suppressed. Kappa and lambda free light chains continue to be within normal limits. Initial plan was to give Velcade on days  1, 4, 8, and 11 along with 10 mg Revlimid on days 1 through 14. Patient will also receive weekly 20 mg dexamethasone.  Revlimid was discontinued secondary to intolerance.  Patient did not receive treatment between June 20, 2020 and November 14, 2020.  Patient then had a second temporary discontinue treatment between December 12, 2020 and January 30, 2021.  Delay treatment once again secondary to declining performance status.  Patient will return to clinic weekly for laboratory work and consideration of blood.  She will then return to clinic in 1 month for further evaluation.   2.  Anemia: Hemoglobin improved to 8.7 with 4 units of packed red blood cells while inpatient.  She does not require an additional transfusion today.   Likely multifactorial with chronic renal insufficiency, underlying myeloma, as well as history of recent GI bleed.  Patient underwent EGD while in the hospital cauterizing several gastric lesions.  Return to clinic weekly as above and then in 4 weeks for further evaluation. 3.  Chronic renal insufficiency: Creatinine  slightly improved to 1.50.  Monitor.   4.  Ascites/peripheral edema: Resolved.  Continue follow-up with heart failure clinic. 5.  Angiodysplastic lesions: Continue follow-up with GI as scheduled.  EGD as above. 6.  Constipation: Patient does not complain of this today.  Continue bowel regimen as prescribed. 7.  Right hip fracture: Continue rehab and follow-up with orthopedics as scheduled. Continue Percocet as needed. 8. Thrombocytopenia: Resolved. 9.  Peripheral neuropathy: Chronic and unchanged.  Continue 700 mg gabapentin at night in addition to 100 mg in the morning and in the afternoon.    Patient expressed understanding and was in agreement with this plan. She also understands that She can call clinic at any time with any questions, concerns, or complaints.    Lloyd Huger, MD   03/01/2021 6:00 AM

## 2021-02-28 ENCOUNTER — Inpatient Hospital Stay: Payer: Medicare Other | Attending: Oncology

## 2021-02-28 ENCOUNTER — Inpatient Hospital Stay (HOSPITAL_BASED_OUTPATIENT_CLINIC_OR_DEPARTMENT_OTHER): Payer: Medicare Other | Admitting: Oncology

## 2021-02-28 ENCOUNTER — Other Ambulatory Visit: Payer: Self-pay

## 2021-02-28 ENCOUNTER — Inpatient Hospital Stay: Payer: Medicare Other

## 2021-02-28 VITALS — BP 110/61 | HR 82 | Temp 97.9°F | Resp 16 | Wt 165.0 lb

## 2021-02-28 DIAGNOSIS — S065XAA Traumatic subdural hemorrhage with loss of consciousness status unknown, initial encounter: Secondary | ICD-10-CM | POA: Insufficient documentation

## 2021-02-28 DIAGNOSIS — R0602 Shortness of breath: Secondary | ICD-10-CM | POA: Diagnosis not present

## 2021-02-28 DIAGNOSIS — I700 Atherosclerosis of aorta: Secondary | ICD-10-CM | POA: Insufficient documentation

## 2021-02-28 DIAGNOSIS — D649 Anemia, unspecified: Secondary | ICD-10-CM | POA: Insufficient documentation

## 2021-02-28 DIAGNOSIS — G629 Polyneuropathy, unspecified: Secondary | ICD-10-CM | POA: Insufficient documentation

## 2021-02-28 DIAGNOSIS — Z818 Family history of other mental and behavioral disorders: Secondary | ICD-10-CM | POA: Diagnosis not present

## 2021-02-28 DIAGNOSIS — Z8249 Family history of ischemic heart disease and other diseases of the circulatory system: Secondary | ICD-10-CM | POA: Diagnosis not present

## 2021-02-28 DIAGNOSIS — N184 Chronic kidney disease, stage 4 (severe): Secondary | ICD-10-CM | POA: Insufficient documentation

## 2021-02-28 DIAGNOSIS — R5383 Other fatigue: Secondary | ICD-10-CM | POA: Insufficient documentation

## 2021-02-28 DIAGNOSIS — E1122 Type 2 diabetes mellitus with diabetic chronic kidney disease: Secondary | ICD-10-CM | POA: Insufficient documentation

## 2021-02-28 DIAGNOSIS — C9000 Multiple myeloma not having achieved remission: Secondary | ICD-10-CM | POA: Diagnosis present

## 2021-02-28 DIAGNOSIS — K5900 Constipation, unspecified: Secondary | ICD-10-CM | POA: Insufficient documentation

## 2021-02-28 DIAGNOSIS — F17210 Nicotine dependence, cigarettes, uncomplicated: Secondary | ICD-10-CM | POA: Diagnosis not present

## 2021-02-28 DIAGNOSIS — R531 Weakness: Secondary | ICD-10-CM | POA: Insufficient documentation

## 2021-02-28 DIAGNOSIS — M549 Dorsalgia, unspecified: Secondary | ICD-10-CM | POA: Insufficient documentation

## 2021-02-28 DIAGNOSIS — Z79899 Other long term (current) drug therapy: Secondary | ICD-10-CM | POA: Diagnosis not present

## 2021-02-28 LAB — COMPREHENSIVE METABOLIC PANEL
ALT: 31 U/L (ref 0–44)
AST: 24 U/L (ref 15–41)
Albumin: 3.9 g/dL (ref 3.5–5.0)
Alkaline Phosphatase: 149 U/L — ABNORMAL HIGH (ref 38–126)
Anion gap: 11 (ref 5–15)
BUN: 39 mg/dL — ABNORMAL HIGH (ref 8–23)
CO2: 30 mmol/L (ref 22–32)
Calcium: 9.2 mg/dL (ref 8.9–10.3)
Chloride: 93 mmol/L — ABNORMAL LOW (ref 98–111)
Creatinine, Ser: 1.5 mg/dL — ABNORMAL HIGH (ref 0.44–1.00)
GFR, Estimated: 35 mL/min — ABNORMAL LOW (ref >60)
Glucose, Bld: 154 mg/dL — ABNORMAL HIGH (ref 70–99)
Potassium: 3.6 mmol/L (ref 3.5–5.1)
Sodium: 134 mmol/L — ABNORMAL LOW (ref 135–145)
Total Bilirubin: 0.7 mg/dL (ref 0.3–1.2)
Total Protein: 7.2 g/dL (ref 6.5–8.1)

## 2021-02-28 LAB — SAMPLE TO BLOOD BANK

## 2021-02-28 LAB — CBC WITH DIFFERENTIAL/PLATELET
Abs Immature Granulocytes: 0.08 10*3/uL — ABNORMAL HIGH (ref 0.00–0.07)
Basophils Absolute: 0.1 10*3/uL (ref 0.0–0.1)
Basophils Relative: 1 %
Eosinophils Absolute: 0.1 10*3/uL (ref 0.0–0.5)
Eosinophils Relative: 1 %
HCT: 28.7 % — ABNORMAL LOW (ref 36.0–46.0)
Hemoglobin: 8.7 g/dL — ABNORMAL LOW (ref 12.0–15.0)
Immature Granulocytes: 1 %
Lymphocytes Relative: 12 %
Lymphs Abs: 1 10*3/uL (ref 0.7–4.0)
MCH: 28.7 pg (ref 26.0–34.0)
MCHC: 30.3 g/dL (ref 30.0–36.0)
MCV: 94.7 fL (ref 80.0–100.0)
Monocytes Absolute: 0.8 10*3/uL (ref 0.1–1.0)
Monocytes Relative: 10 %
Neutro Abs: 6.3 10*3/uL (ref 1.7–7.7)
Neutrophils Relative %: 75 %
Platelets: 205 10*3/uL (ref 150–400)
RBC: 3.03 MIL/uL — ABNORMAL LOW (ref 3.87–5.11)
RDW: 20 % — ABNORMAL HIGH (ref 11.5–15.5)
WBC: 8.3 10*3/uL (ref 4.0–10.5)
nRBC: 0.4 % — ABNORMAL HIGH (ref 0.0–0.2)

## 2021-02-28 LAB — MAGNESIUM: Magnesium: 2.7 mg/dL — ABNORMAL HIGH (ref 1.7–2.4)

## 2021-02-28 LAB — PHOSPHORUS: Phosphorus: 5.6 mg/dL — ABNORMAL HIGH (ref 2.5–4.6)

## 2021-02-28 NOTE — Progress Notes (Signed)
Pt was concerned about getting velcade today. Pt states that she fell after getting it at her last visit. C/o left posterior rib pain when moving around s/p fall.

## 2021-03-01 ENCOUNTER — Encounter: Payer: Self-pay | Admitting: Oncology

## 2021-03-01 LAB — KAPPA/LAMBDA LIGHT CHAINS
Kappa free light chain: 6.1 mg/L (ref 3.3–19.4)
Kappa, lambda light chain ratio: 0.64 (ref 0.26–1.65)
Lambda free light chains: 9.5 mg/L (ref 5.7–26.3)

## 2021-03-01 LAB — IGG, IGA, IGM
IgA: 632 mg/dL — ABNORMAL HIGH (ref 64–422)
IgG (Immunoglobin G), Serum: 104 mg/dL — ABNORMAL LOW (ref 586–1602)
IgM (Immunoglobulin M), Srm: <5 mg/dL — ABNORMAL LOW (ref 26–217)

## 2021-03-02 LAB — PROTEIN ELECTROPHORESIS, SERUM
A/G Ratio: 1.3 (ref 0.7–1.7)
Albumin ELP: 3.5 g/dL (ref 2.9–4.4)
Alpha-1-Globulin: 0.3 g/dL (ref 0.0–0.4)
Alpha-2-Globulin: 0.8 g/dL (ref 0.4–1.0)
Beta Globulin: 1.2 g/dL (ref 0.7–1.3)
Gamma Globulin: 0.3 g/dL — ABNORMAL LOW (ref 0.4–1.8)
Globulin, Total: 2.7 g/dL (ref 2.2–3.9)
M-Spike, %: 0.2 g/dL — ABNORMAL HIGH
Total Protein ELP: 6.2 g/dL (ref 6.0–8.5)

## 2021-03-08 ENCOUNTER — Inpatient Hospital Stay: Payer: Medicare Other

## 2021-03-08 ENCOUNTER — Other Ambulatory Visit: Payer: Self-pay

## 2021-03-08 DIAGNOSIS — C9000 Multiple myeloma not having achieved remission: Secondary | ICD-10-CM

## 2021-03-08 LAB — CBC WITH DIFFERENTIAL/PLATELET
Abs Immature Granulocytes: 0.08 10*3/uL — ABNORMAL HIGH (ref 0.00–0.07)
Basophils Absolute: 0 10*3/uL (ref 0.0–0.1)
Basophils Relative: 1 %
Eosinophils Absolute: 0.1 10*3/uL (ref 0.0–0.5)
Eosinophils Relative: 1 %
HCT: 29.3 % — ABNORMAL LOW (ref 36.0–46.0)
Hemoglobin: 8.8 g/dL — ABNORMAL LOW (ref 12.0–15.0)
Immature Granulocytes: 1 %
Lymphocytes Relative: 17 %
Lymphs Abs: 1.1 10*3/uL (ref 0.7–4.0)
MCH: 29 pg (ref 26.0–34.0)
MCHC: 30 g/dL (ref 30.0–36.0)
MCV: 96.7 fL (ref 80.0–100.0)
Monocytes Absolute: 0.8 10*3/uL (ref 0.1–1.0)
Monocytes Relative: 13 %
Neutro Abs: 4.4 10*3/uL (ref 1.7–7.7)
Neutrophils Relative %: 67 %
Platelets: 245 10*3/uL (ref 150–400)
RBC: 3.03 MIL/uL — ABNORMAL LOW (ref 3.87–5.11)
RDW: 20.5 % — ABNORMAL HIGH (ref 11.5–15.5)
WBC: 6.5 10*3/uL (ref 4.0–10.5)
nRBC: 0 % (ref 0.0–0.2)

## 2021-03-08 LAB — COMPREHENSIVE METABOLIC PANEL
ALT: 17 U/L (ref 0–44)
AST: 16 U/L (ref 15–41)
Albumin: 4.1 g/dL (ref 3.5–5.0)
Alkaline Phosphatase: 124 U/L (ref 38–126)
Anion gap: 11 (ref 5–15)
BUN: 31 mg/dL — ABNORMAL HIGH (ref 8–23)
CO2: 29 mmol/L (ref 22–32)
Calcium: 8.9 mg/dL (ref 8.9–10.3)
Chloride: 100 mmol/L (ref 98–111)
Creatinine, Ser: 1.62 mg/dL — ABNORMAL HIGH (ref 0.44–1.00)
GFR, Estimated: 32 mL/min — ABNORMAL LOW (ref >60)
Glucose, Bld: 112 mg/dL — ABNORMAL HIGH (ref 70–99)
Potassium: 3.6 mmol/L (ref 3.5–5.1)
Sodium: 140 mmol/L (ref 135–145)
Total Bilirubin: 0.8 mg/dL (ref 0.3–1.2)
Total Protein: 7.1 g/dL (ref 6.5–8.1)

## 2021-03-08 LAB — SAMPLE TO BLOOD BANK

## 2021-03-08 NOTE — Progress Notes (Signed)
Hgb 8.8, no blood today per Dr. Grayland Ormond

## 2021-03-09 LAB — IGG, IGA, IGM
IgA: 765 mg/dL — ABNORMAL HIGH (ref 64–422)
IgG (Immunoglobin G), Serum: 113 mg/dL — ABNORMAL LOW (ref 586–1602)
IgM (Immunoglobulin M), Srm: <5 mg/dL — ABNORMAL LOW (ref 26–217)

## 2021-03-09 LAB — KAPPA/LAMBDA LIGHT CHAINS
Kappa free light chain: 6.5 mg/L (ref 3.3–19.4)
Kappa, lambda light chain ratio: 0.73 (ref 0.26–1.65)
Lambda free light chains: 8.9 mg/L (ref 5.7–26.3)

## 2021-03-12 LAB — PROTEIN ELECTROPHORESIS, SERUM
A/G Ratio: 1.3 (ref 0.7–1.7)
Albumin ELP: 3.7 g/dL (ref 2.9–4.4)
Alpha-1-Globulin: 0.3 g/dL (ref 0.0–0.4)
Alpha-2-Globulin: 0.9 g/dL (ref 0.4–1.0)
Beta Globulin: 1.3 g/dL (ref 0.7–1.3)
Gamma Globulin: 0.3 g/dL — ABNORMAL LOW (ref 0.4–1.8)
Globulin, Total: 2.8 g/dL (ref 2.2–3.9)
M-Spike, %: 0.3 g/dL — ABNORMAL HIGH
Total Protein ELP: 6.5 g/dL (ref 6.0–8.5)

## 2021-03-15 ENCOUNTER — Inpatient Hospital Stay: Payer: Medicare Other

## 2021-03-15 ENCOUNTER — Inpatient Hospital Stay: Payer: Medicare Other | Attending: Oncology

## 2021-03-15 ENCOUNTER — Other Ambulatory Visit: Payer: Self-pay

## 2021-03-15 DIAGNOSIS — Z7952 Long term (current) use of systemic steroids: Secondary | ICD-10-CM | POA: Insufficient documentation

## 2021-03-15 DIAGNOSIS — I130 Hypertensive heart and chronic kidney disease with heart failure and stage 1 through stage 4 chronic kidney disease, or unspecified chronic kidney disease: Secondary | ICD-10-CM | POA: Diagnosis not present

## 2021-03-15 DIAGNOSIS — Z888 Allergy status to other drugs, medicaments and biological substances status: Secondary | ICD-10-CM | POA: Diagnosis not present

## 2021-03-15 DIAGNOSIS — K5900 Constipation, unspecified: Secondary | ICD-10-CM | POA: Insufficient documentation

## 2021-03-15 DIAGNOSIS — Z9049 Acquired absence of other specified parts of digestive tract: Secondary | ICD-10-CM | POA: Diagnosis not present

## 2021-03-15 DIAGNOSIS — Z79899 Other long term (current) drug therapy: Secondary | ICD-10-CM | POA: Insufficient documentation

## 2021-03-15 DIAGNOSIS — Z886 Allergy status to analgesic agent status: Secondary | ICD-10-CM | POA: Insufficient documentation

## 2021-03-15 DIAGNOSIS — Z881 Allergy status to other antibiotic agents status: Secondary | ICD-10-CM | POA: Diagnosis not present

## 2021-03-15 DIAGNOSIS — R5383 Other fatigue: Secondary | ICD-10-CM | POA: Diagnosis not present

## 2021-03-15 DIAGNOSIS — N184 Chronic kidney disease, stage 4 (severe): Secondary | ICD-10-CM | POA: Diagnosis not present

## 2021-03-15 DIAGNOSIS — I252 Old myocardial infarction: Secondary | ICD-10-CM | POA: Insufficient documentation

## 2021-03-15 DIAGNOSIS — C9000 Multiple myeloma not having achieved remission: Secondary | ICD-10-CM | POA: Diagnosis present

## 2021-03-15 DIAGNOSIS — R0602 Shortness of breath: Secondary | ICD-10-CM | POA: Diagnosis not present

## 2021-03-15 DIAGNOSIS — M549 Dorsalgia, unspecified: Secondary | ICD-10-CM | POA: Insufficient documentation

## 2021-03-15 DIAGNOSIS — R531 Weakness: Secondary | ICD-10-CM | POA: Insufficient documentation

## 2021-03-15 DIAGNOSIS — Z880 Allergy status to penicillin: Secondary | ICD-10-CM | POA: Insufficient documentation

## 2021-03-15 DIAGNOSIS — D649 Anemia, unspecified: Secondary | ICD-10-CM | POA: Diagnosis not present

## 2021-03-15 DIAGNOSIS — Z818 Family history of other mental and behavioral disorders: Secondary | ICD-10-CM | POA: Insufficient documentation

## 2021-03-15 DIAGNOSIS — E1122 Type 2 diabetes mellitus with diabetic chronic kidney disease: Secondary | ICD-10-CM | POA: Diagnosis not present

## 2021-03-15 DIAGNOSIS — Z8249 Family history of ischemic heart disease and other diseases of the circulatory system: Secondary | ICD-10-CM | POA: Insufficient documentation

## 2021-03-15 DIAGNOSIS — Z882 Allergy status to sulfonamides status: Secondary | ICD-10-CM | POA: Insufficient documentation

## 2021-03-15 DIAGNOSIS — S065XAA Traumatic subdural hemorrhage with loss of consciousness status unknown, initial encounter: Secondary | ICD-10-CM | POA: Insufficient documentation

## 2021-03-15 LAB — CBC WITH DIFFERENTIAL/PLATELET
Abs Immature Granulocytes: 0.12 10*3/uL — ABNORMAL HIGH (ref 0.00–0.07)
Basophils Absolute: 0.1 10*3/uL (ref 0.0–0.1)
Basophils Relative: 1 %
Eosinophils Absolute: 0.1 10*3/uL (ref 0.0–0.5)
Eosinophils Relative: 1 %
HCT: 30.1 % — ABNORMAL LOW (ref 36.0–46.0)
Hemoglobin: 9 g/dL — ABNORMAL LOW (ref 12.0–15.0)
Immature Granulocytes: 2 %
Lymphocytes Relative: 14 %
Lymphs Abs: 1.2 10*3/uL (ref 0.7–4.0)
MCH: 28.4 pg (ref 26.0–34.0)
MCHC: 29.9 g/dL — ABNORMAL LOW (ref 30.0–36.0)
MCV: 95 fL (ref 80.0–100.0)
Monocytes Absolute: 0.9 10*3/uL (ref 0.1–1.0)
Monocytes Relative: 11 %
Neutro Abs: 5.9 10*3/uL (ref 1.7–7.7)
Neutrophils Relative %: 71 %
Platelets: 253 10*3/uL (ref 150–400)
RBC: 3.17 MIL/uL — ABNORMAL LOW (ref 3.87–5.11)
RDW: 19.2 % — ABNORMAL HIGH (ref 11.5–15.5)
WBC: 8.2 10*3/uL (ref 4.0–10.5)
nRBC: 0.2 % (ref 0.0–0.2)

## 2021-03-15 LAB — COMPREHENSIVE METABOLIC PANEL
ALT: 11 U/L (ref 0–44)
AST: 14 U/L — ABNORMAL LOW (ref 15–41)
Albumin: 4.2 g/dL (ref 3.5–5.0)
Alkaline Phosphatase: 118 U/L (ref 38–126)
Anion gap: 11 (ref 5–15)
BUN: 37 mg/dL — ABNORMAL HIGH (ref 8–23)
CO2: 29 mmol/L (ref 22–32)
Calcium: 9.2 mg/dL (ref 8.9–10.3)
Chloride: 97 mmol/L — ABNORMAL LOW (ref 98–111)
Creatinine, Ser: 1.54 mg/dL — ABNORMAL HIGH (ref 0.44–1.00)
GFR, Estimated: 34 mL/min — ABNORMAL LOW (ref >60)
Glucose, Bld: 115 mg/dL — ABNORMAL HIGH (ref 70–99)
Potassium: 3.4 mmol/L — ABNORMAL LOW (ref 3.5–5.1)
Sodium: 137 mmol/L (ref 135–145)
Total Bilirubin: 0.6 mg/dL (ref 0.3–1.2)
Total Protein: 7.4 g/dL (ref 6.5–8.1)

## 2021-03-15 LAB — SAMPLE TO BLOOD BANK

## 2021-03-15 NOTE — Progress Notes (Signed)
Heather Peterson hemoglobin is 9.0 today. Per Dr. Grayland Ormond patient does not need a blood transfusion and can be sent home. Patient aware and verbalizes understanding.

## 2021-03-16 LAB — IGG, IGA, IGM
IgA: 797 mg/dL — ABNORMAL HIGH (ref 64–422)
IgG (Immunoglobin G), Serum: 121 mg/dL — ABNORMAL LOW (ref 586–1602)
IgM (Immunoglobulin M), Srm: <5 mg/dL — ABNORMAL LOW (ref 26–217)

## 2021-03-16 LAB — KAPPA/LAMBDA LIGHT CHAINS
Kappa free light chain: 5.3 mg/L (ref 3.3–19.4)
Kappa, lambda light chain ratio: 0.52 (ref 0.26–1.65)
Lambda free light chains: 10.1 mg/L (ref 5.7–26.3)

## 2021-03-19 LAB — PROTEIN ELECTROPHORESIS, SERUM
A/G Ratio: 1.3 (ref 0.7–1.7)
Albumin ELP: 3.7 g/dL (ref 2.9–4.4)
Alpha-1-Globulin: 0.3 g/dL (ref 0.0–0.4)
Alpha-2-Globulin: 0.9 g/dL (ref 0.4–1.0)
Beta Globulin: 1.4 g/dL — ABNORMAL HIGH (ref 0.7–1.3)
Gamma Globulin: 0.3 g/dL — ABNORMAL LOW (ref 0.4–1.8)
Globulin, Total: 2.9 g/dL (ref 2.2–3.9)
M-Spike, %: 0.5 g/dL — ABNORMAL HIGH
Total Protein ELP: 6.6 g/dL (ref 6.0–8.5)

## 2021-03-22 NOTE — Progress Notes (Signed)
[facility]  [phone] [phone]  ID: Heather Peterson OB: Apr 15, 1941  MR#: [id number]  AVW#:[id number]  [name], MD as PCP - General (Family Medicine) Lloyd Huger, MD as Consulting Physician (Oncology)  CHIEF COMPLAINT: Multiple myeloma, subdural hematoma.  INTERVAL HISTORY: Patient returns to clinic today for repeat laboratory, further evaluation, and consideration of additional blood.  She continues to have significant weakness and fatigue, but states this is mildly improved over the past several weeks. Her peripheral edema and CHF symptoms have essentially resolved. She does not complain of any neuropathy today.  She has no neurologic complaints.  She has chronic shortness of breath and requires oxygen 24 hours/day.  She denies any fevers.  She has a fair appetite.  She denies any chest pain, cough, or hemoptysis. She denies any nausea, vomiting, constipation or diarrhea.  She has no further abdominal pain.  She denies any melena or hematochezia.  She has no urinary complaints.  Patient offers no further specific complaints today.   REVIEW OF SYSTEMS:   Review of Systems  Constitutional:  Positive for malaise/fatigue. Negative for fever.  Respiratory:  Positive for shortness of breath. Negative for cough and hemoptysis.   Cardiovascular: Negative.  Negative for chest pain and leg swelling.  Gastrointestinal: Negative.  Negative for abdominal pain, constipation, diarrhea, melena and nausea.  Genitourinary: Negative.  Negative for dysuria and hematuria.  Musculoskeletal:  Positive for back pain. Negative for joint pain.  Skin: Negative.  Negative for rash.  Neurological:  Positive for weakness. Negative for dizziness, sensory change, focal weakness and headaches.  Psychiatric/Behavioral: Negative.  The patient is not nervous/anxious.    As per HPI. Otherwise, a complete review of systems is  negative.  PAST MEDICAL HISTORY: Past Medical History:  Diagnosis Date   Anxiety    Chronic combined systolic and diastolic CHF (congestive heart failure) (HCC)    Chronic kidney disease    Coronary artery disease    Depression    Diabetes mellitus without complication (HCC)    Diabetes mellitus, type II ([place])    Hypertension    MI (myocardial infarction) ([place])    x 5   Pacemaker    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[place])    Prolonged Q-T interval on ECG    Thyroid disease     PAST SURGICAL HISTORY: Past Surgical History:  Procedure Laterality Date   CENTRAL LINE INSERTION  03/11/2017   Procedure: CENTRAL LINE INSERTION;  Surgeon: Leonie Man, MD;  Location: [facility];  Service: Cardiovascular;;   CHOLECYSTECTOMY     COLONOSCOPY WITH PROPOFOL N/A 09/01/2019   Procedure: COLONOSCOPY WITH PROPOFOL;  Surgeon: Toledo, Benay Pike, MD;  Location: [facility];  Service: Gastroenterology;  Laterality: N/A;   CORONARY STENT INTERVENTION W/IMPELLA N/A 03/11/2017   Procedure: Coronary Stent Intervention w/Impella;  Surgeon: Leonie Man, MD;  Location: [facility];  Service: Cardiovascular;  Laterality: N/A;   ESOPHAGOGASTRODUODENOSCOPY (EGD) WITH PROPOFOL N/A 09/01/2019   Procedure: ESOPHAGOGASTRODUODENOSCOPY (EGD) WITH PROPOFOL;  Surgeon: Toledo, Benay Pike, MD;  Location: [facility];  Service: Gastroenterology;  Laterality: N/A;   ESOPHAGOGASTRODUODENOSCOPY (EGD) WITH PROPOFOL N/A 09/08/2019   Procedure: ESOPHAGOGASTRODUODENOSCOPY (EGD) WITH PROPOFOL;  Surgeon: [name], MD;  Location: [facility];  Service: Gastroenterology;  Laterality: N/A;   ESOPHAGOGASTRODUODENOSCOPY (EGD) WITH PROPOFOL N/A 02/15/2021   Procedure: ESOPHAGOGASTRODUODENOSCOPY (EGD) WITH PROPOFOL;  Surgeon: [name], MD;  Location: [facility] END[facility]COPY;  Service: Gastroenterology;  Laterality:  N/A;   EYE SURGERY     HIP ARTHROPLASTY Right 03/29/2020   Procedure: ARTHROPLASTY BIPOLAR HIP  (HEMIARTHROPLASTY);  Surgeon: Corky Mull, MD;  Location: [facility] ORS;  Service: Orthopedics;  Laterality: Right;   INTRAVASCULAR PRESSURE WIRE/FFR STUDY N/A 03/11/2017   Procedure: INTRAVASCULAR PRESSURE WIRE/FFR STUDY;  Surgeon: Leonie Man, MD;  Location: [place] [facility];  Service: Cardiovascular;  Laterality: N/A;   INTRAVASCULAR ULTRASOUND/IVUS N/A 03/11/2017   Procedure: Intravascular Ultrasound/IVUS;  Surgeon: Leonie Man, MD;  Location: [facility];  Service: Cardiovascular;  Laterality: N/A;   LEFT HEART CATH AND CORONARY ANGIOGRAPHY N/A 03/05/2017   Procedure: LEFT HEART CATH AND CORONARY ANGIOGRAPHY;  Surgeon: Isaias Cowman, MD;  Location: [facility];  Service: Cardiovascular;  Laterality: N/A;   PACEMAKER IMPLANT     pacemaker/defibrillator Left     FAMILY HISTORY: Family History  Problem Relation Age of Onset   Hypertension Father    Heart attack Father    Depression Sister    Depression Brother    Depression Brother     ADVANCED DIRECTIVES (Y/N):  N  HEALTH MAINTENANCE: Social History   Tobacco Use   Smoking status: Every Day    Packs/day: 0.25    Types: E-cigarettes, Cigarettes   Smokeless tobacco: Never  Vaping Use   Vaping Use: Former  Substance Use Topics   Alcohol use: Not Currently    Comment: occasionally   Drug use: Yes    Comment: prescribed pain meds     Colonoscopy:  PAP:  Bone density:  Lipid panel:  Allergies  Allergen Reactions   Celebrex [Celecoxib] Anaphylaxis   Glipizide Anaphylaxis   Levaquin [Levofloxacin In D5w] Other (See Comments)    Heart arrhthymias   Levofloxacin Other (See Comments) and Palpitations    ICD fired   Lisinopril Swelling    Lip and facial swelling   Sulfa Antibiotics Other (See Comments) and Anaphylaxis    Reaction: unknown   Metformin Other (See Comments)    Gi tolerance    Penicillins Rash and Other (See Comments)    Has patient had a PCN reaction causing immediate  rash, facial/tongue/throat swelling, SOB or lightheadedness with hypotension: Unknown Has patient had a PCN reaction causing severe rash involving mucus membranes or skin necrosis: No Has patient had a PCN reaction that required hospitalization: No Has patient had a PCN reaction occurring within the last 10 years: No If all of the above answers are "NO", then may proceed with Cephalosporin use.     Current Outpatient Medications  Medication Sig Dispense Refill   allopurinol (ZYLOPRIM) 100 MG tablet Take 200 mg by mouth daily.     aspirin EC 81 MG tablet Take 81 mg by mouth at bedtime.      dexamethasone (DECADRON) 4 MG tablet Take 5 tablets (20 mg total) by mouth daily. Take the day after Velcade on days 2,5,9,12. Take with breakfast 40 tablet 3   docusate sodium (COLACE) 100 MG capsule Take 100 mg by mouth 2 (two) times daily.     Ensure Max Protein (ENSURE MAX PROTEIN) LIQD Take 330 mLs (11 oz total) by mouth 2 (two) times daily between meals.     fexofenadine (ALLEGRA) 180 MG tablet Take 180 mg by mouth daily.     FLUoxetine (PROZAC) 10 MG capsule Take 10 mg by mouth daily.     gabapentin (NEURONTIN) 100 MG capsule TAKE 1 CAPSULE(100 MG) BY MOUTH THREE TIMES DAILY (Patient taking differently: Patient  reports 100 mg QAM, 700 mg QHS) 270 capsule 1   gabapentin (NEURONTIN) 600 MG tablet Take 600 mg by mouth daily. Patient takes 100 mg QAM, 700 mg QHS     hydrocortisone (ANUSOL-HC) 2.5 % rectal cream Apply 1 application topically 2 (two) times daily.     Lactulose 20 GM/30ML SOLN Take 30 mLs (20 g total) by mouth in the morning and at bedtime. 450 mL 1   levothyroxine (SYNTHROID) 50 MCG tablet Take 50 mcg by mouth daily before breakfast.     levothyroxine (SYNTHROID) 75 MCG tablet Take 1 tablet (75 mcg total) by mouth daily before breakfast. On Monday, Wednesday and Friday     magnesium oxide (MAG-OX) 400 MG tablet Take 200 mg by mouth daily.     mexiletine (MEXITIL) 200 MG capsule Take 1  capsule (200 mg total) by mouth every 12 (twelve) hours. 60 capsule 1   OXYGEN Inhale 2 L into the lungs continuous.     pantoprazole (PROTONIX) 40 MG tablet Take 40 mg by mouth daily.      polyethylene glycol (MIRALAX) 17 g packet Take 17 g by mouth daily. 14 each 0   Probiotic Product (PROBIOTIC DAILY PO) Take 1 tablet by mouth daily.     SANTYL ointment Apply topically.     simvastatin (ZOCOR) 40 MG tablet Take 20 mg by mouth at bedtime.   2   spironolactone (ALDACTONE) 25 MG tablet Take 0.5 tablets (12.5 mg total) by mouth every other day. (Patient taking differently: Take 25 mg by mouth daily.) 15 tablet 3   torsemide (DEMADEX) 20 MG tablet Take 40 mg by mouth daily.     traZODone (DESYREL) 50 MG tablet Take 50 mg by mouth at bedtime.     No current facility-administered medications for this visit.    OBJECTIVE: Vitals:   03/28/21 0934  BP: 106/64  Pulse: 88  Resp: 18  Temp: 97.9 F (36.6 C)  SpO2: 100%     Body mass index is 27.99 kg/m.    ECOG FS:2 - Symptomatic, <50% confined to bed  General: Well-developed, well-nourished, no acute distress.  Sitting in a wheelchair. Eyes: Pink conjunctiva, anicteric sclera. HEENT: Normocephalic, moist mucous membranes. Lungs: No audible wheezing or coughing. Heart: Regular rate and rhythm. Abdomen: Soft, nontender, no obvious distention. Musculoskeletal: No edema, cyanosis, or clubbing. Neuro: Alert, answering all questions appropriately. Cranial nerves grossly intact. Skin: No rashes or petechiae noted. Psych: Normal affect.    LAB RESULTS:  Lab Results  Component Value Date   NA 137 03/28/2021   K 3.5 03/28/2021   CL 98 03/28/2021   CO2 26 03/28/2021   GLUCOSE 127 (H) 03/28/2021   BUN 30 (H) 03/28/2021   CREATININE 1.54 (H) 03/28/2021   CALCIUM 8.9 03/28/2021   PROT 7.5 03/28/2021   ALBUMIN 4.2 03/28/2021   AST 16 03/28/2021   ALT 11 03/28/2021   ALKPHOS 133 (H) 03/28/2021   BILITOT 0.4 03/28/2021   GFRNONAA 34 (L)  03/28/2021   GFRAA 30 (L) 09/09/2019    Lab Results  Component Value Date   WBC 10.2 03/28/2021   NEUTROABS 7.6 03/28/2021   HGB 8.7 (L) 03/28/2021   HCT 29.5 (L) 03/28/2021   MCV 94.9 03/28/2021   PLT 237 03/28/2021   Lab Results  Component Value Date   IRON 43 02/12/2021   TIBC 570 (H) 02/12/2021   IRONPCTSAT 8 (L) 02/12/2021   Lab Results  Component Value Date   FERRITIN 50 05/23/2020  STUDIES: No results found.  ASSESSMENT: Stage II multiple myeloma, fractured right hip.  PLAN:    1.  Stage II multiple myeloma: Bone marrow biopsy confirmed diagnosis with plasma cells up to 90% of biopsy.  Cytogenetics are reported as normal.  Metastatic bone survey from March 03, 2020 reviewed independently with multiple skeletal lucencies consistent with myeloma. Initial plan was to give Velcade on days 1, 4, 8, and 11 along with 10 mg Revlimid on days 1 through 14. Patient will also receive weekly 20 mg dexamethasone.  Revlimid was discontinued secondary to intolerance.  Patient did not receive treatment between June 20, 2020 and November 14, 2020.  Patient then had a second temporary discontinue treatment between December 12, 2020 and January 30, 2021.  Continue to hold treatment and patient is contemplating discontinuing altogether.  Her most recent M spike on March 15, 2021 has trended up slightly to 0.5 and her IgA levels from today have increased to 1048. Kappa and lambda free light chains continue to be within normal limits.  Return to clinic in 3 weeks for laboratory work only and then in 6 weeks for laboratory work and further evaluation. 2.  Anemia: Chronic and unchanged.  Patient's hemoglobin is 8.7 today.  She does not require transfusion today.  Likely multifactorial with chronic renal insufficiency, underlying myeloma, as well as history of recent GI bleed.  Patient underwent EGD while in the hospital cauterizing several gastric lesions.   3.  Chronic renal insufficiency:  Chronic and unchanged.  Patient's most recent creatinine is 1.54. 4.  Ascites/peripheral edema: Resolved.  Continue follow-up with heart failure clinic. 5.  Angiodysplastic lesions: Continue follow-up with GI as scheduled.  EGD as above. 6.  Constipation: Patient does not complain of this today.  Continue bowel regimen as prescribed. 7.  Right hip fracture: Resolved. 8. Thrombocytopenia: Resolved. 9.  Peripheral neuropathy: Chronic and unchanged.  Continue 700 mg gabapentin at night in addition to 100 mg in the morning and in the afternoon.    Patient expressed understanding and was in agreement with this plan. She also understands that She can call clinic at any time with any questions, concerns, or complaints.    Lloyd Huger, MD   03/29/2021 10:53 AM

## 2021-03-28 ENCOUNTER — Inpatient Hospital Stay (HOSPITAL_BASED_OUTPATIENT_CLINIC_OR_DEPARTMENT_OTHER): Payer: Medicare Other | Admitting: Oncology

## 2021-03-28 ENCOUNTER — Other Ambulatory Visit: Payer: Self-pay

## 2021-03-28 ENCOUNTER — Inpatient Hospital Stay: Payer: Medicare Other

## 2021-03-28 VITALS — BP 106/64 | HR 88 | Temp 97.9°F | Resp 18 | Wt 158.0 lb

## 2021-03-28 DIAGNOSIS — C9000 Multiple myeloma not having achieved remission: Secondary | ICD-10-CM

## 2021-03-28 LAB — CBC WITH DIFFERENTIAL/PLATELET
Abs Immature Granulocytes: 0.11 10*3/uL — ABNORMAL HIGH (ref 0.00–0.07)
Basophils Absolute: 0.1 10*3/uL (ref 0.0–0.1)
Basophils Relative: 1 %
Eosinophils Absolute: 0.1 10*3/uL (ref 0.0–0.5)
Eosinophils Relative: 1 %
HCT: 29.5 % — ABNORMAL LOW (ref 36.0–46.0)
Hemoglobin: 8.7 g/dL — ABNORMAL LOW (ref 12.0–15.0)
Immature Granulocytes: 1 %
Lymphocytes Relative: 14 %
Lymphs Abs: 1.4 10*3/uL (ref 0.7–4.0)
MCH: 28 pg (ref 26.0–34.0)
MCHC: 29.5 g/dL — ABNORMAL LOW (ref 30.0–36.0)
MCV: 94.9 fL (ref 80.0–100.0)
Monocytes Absolute: 0.9 10*3/uL (ref 0.1–1.0)
Monocytes Relative: 8 %
Neutro Abs: 7.6 10*3/uL (ref 1.7–7.7)
Neutrophils Relative %: 75 %
Platelets: 237 10*3/uL (ref 150–400)
RBC: 3.11 MIL/uL — ABNORMAL LOW (ref 3.87–5.11)
RDW: 18.8 % — ABNORMAL HIGH (ref 11.5–15.5)
WBC: 10.2 10*3/uL (ref 4.0–10.5)
nRBC: 0 % (ref 0.0–0.2)

## 2021-03-28 LAB — SAMPLE TO BLOOD BANK

## 2021-03-28 LAB — COMPREHENSIVE METABOLIC PANEL
ALT: 11 U/L (ref 0–44)
AST: 16 U/L (ref 15–41)
Albumin: 4.2 g/dL (ref 3.5–5.0)
Alkaline Phosphatase: 133 U/L — ABNORMAL HIGH (ref 38–126)
Anion gap: 13 (ref 5–15)
BUN: 30 mg/dL — ABNORMAL HIGH (ref 8–23)
CO2: 26 mmol/L (ref 22–32)
Calcium: 8.9 mg/dL (ref 8.9–10.3)
Chloride: 98 mmol/L (ref 98–111)
Creatinine, Ser: 1.54 mg/dL — ABNORMAL HIGH (ref 0.44–1.00)
GFR, Estimated: 34 mL/min — ABNORMAL LOW (ref >60)
Glucose, Bld: 127 mg/dL — ABNORMAL HIGH (ref 70–99)
Potassium: 3.5 mmol/L (ref 3.5–5.1)
Sodium: 137 mmol/L (ref 135–145)
Total Bilirubin: 0.4 mg/dL (ref 0.3–1.2)
Total Protein: 7.5 g/dL (ref 6.5–8.1)

## 2021-03-28 NOTE — Progress Notes (Signed)
Pt c/o several "bad days" since last visit. Pt and family seems to think she was dehydrated. Symptoms has since resolved

## 2021-03-29 ENCOUNTER — Encounter: Payer: Self-pay | Admitting: Oncology

## 2021-03-29 LAB — IGG, IGA, IGM
IgA: 1048 mg/dL — ABNORMAL HIGH (ref 64–422)
IgG (Immunoglobin G), Serum: 124 mg/dL — ABNORMAL LOW (ref 586–1602)
IgM (Immunoglobulin M), Srm: <5 mg/dL — ABNORMAL LOW (ref 26–217)

## 2021-03-29 LAB — KAPPA/LAMBDA LIGHT CHAINS
Kappa free light chain: 6 mg/L (ref 3.3–19.4)
Kappa, lambda light chain ratio: 0.68 (ref 0.26–1.65)
Lambda free light chains: 8.8 mg/L (ref 5.7–26.3)

## 2021-04-02 LAB — PROTEIN ELECTROPHORESIS, SERUM
A/G Ratio: 1.2 (ref 0.7–1.7)
Albumin ELP: 3.8 g/dL (ref 2.9–4.4)
Alpha-1-Globulin: 0.3 g/dL (ref 0.0–0.4)
Alpha-2-Globulin: 0.9 g/dL (ref 0.4–1.0)
Beta Globulin: 1.6 g/dL — ABNORMAL HIGH (ref 0.7–1.3)
Gamma Globulin: 0.4 g/dL (ref 0.4–1.8)
Globulin, Total: 3.2 g/dL (ref 2.2–3.9)
M-Spike, %: 0.5 g/dL — ABNORMAL HIGH
Total Protein ELP: 7 g/dL (ref 6.0–8.5)

## 2021-04-25 ENCOUNTER — Other Ambulatory Visit: Payer: Self-pay

## 2021-04-25 ENCOUNTER — Inpatient Hospital Stay: Payer: Medicare Other | Attending: Oncology

## 2021-04-25 DIAGNOSIS — N184 Chronic kidney disease, stage 4 (severe): Secondary | ICD-10-CM | POA: Insufficient documentation

## 2021-04-25 DIAGNOSIS — E1122 Type 2 diabetes mellitus with diabetic chronic kidney disease: Secondary | ICD-10-CM | POA: Insufficient documentation

## 2021-04-25 DIAGNOSIS — K5900 Constipation, unspecified: Secondary | ICD-10-CM | POA: Diagnosis not present

## 2021-04-25 DIAGNOSIS — Z79899 Other long term (current) drug therapy: Secondary | ICD-10-CM | POA: Insufficient documentation

## 2021-04-25 DIAGNOSIS — C9000 Multiple myeloma not having achieved remission: Secondary | ICD-10-CM | POA: Insufficient documentation

## 2021-04-25 DIAGNOSIS — D649 Anemia, unspecified: Secondary | ICD-10-CM | POA: Insufficient documentation

## 2021-04-25 LAB — CBC WITH DIFFERENTIAL/PLATELET
Abs Immature Granulocytes: 0.14 10*3/uL — ABNORMAL HIGH (ref 0.00–0.07)
Basophils Absolute: 0.1 10*3/uL (ref 0.0–0.1)
Basophils Relative: 1 %
Eosinophils Absolute: 0.1 10*3/uL (ref 0.0–0.5)
Eosinophils Relative: 1 %
HCT: 28.8 % — ABNORMAL LOW (ref 36.0–46.0)
Hemoglobin: 8.6 g/dL — ABNORMAL LOW (ref 12.0–15.0)
Immature Granulocytes: 2 %
Lymphocytes Relative: 15 %
Lymphs Abs: 1.2 10*3/uL (ref 0.7–4.0)
MCH: 27.7 pg (ref 26.0–34.0)
MCHC: 29.9 g/dL — ABNORMAL LOW (ref 30.0–36.0)
MCV: 92.9 fL (ref 80.0–100.0)
Monocytes Absolute: 0.9 10*3/uL (ref 0.1–1.0)
Monocytes Relative: 11 %
Neutro Abs: 5.9 10*3/uL (ref 1.7–7.7)
Neutrophils Relative %: 70 %
Platelets: 228 10*3/uL (ref 150–400)
RBC: 3.1 MIL/uL — ABNORMAL LOW (ref 3.87–5.11)
RDW: 18.2 % — ABNORMAL HIGH (ref 11.5–15.5)
WBC: 8.3 10*3/uL (ref 4.0–10.5)
nRBC: 0.4 % — ABNORMAL HIGH (ref 0.0–0.2)

## 2021-04-25 LAB — COMPREHENSIVE METABOLIC PANEL
ALT: 11 U/L (ref 0–44)
AST: 15 U/L (ref 15–41)
Albumin: 4.1 g/dL (ref 3.5–5.0)
Alkaline Phosphatase: 144 U/L — ABNORMAL HIGH (ref 38–126)
Anion gap: 11 (ref 5–15)
BUN: 34 mg/dL — ABNORMAL HIGH (ref 8–23)
CO2: 27 mmol/L (ref 22–32)
Calcium: 9.8 mg/dL (ref 8.9–10.3)
Chloride: 100 mmol/L (ref 98–111)
Creatinine, Ser: 1.79 mg/dL — ABNORMAL HIGH (ref 0.44–1.00)
GFR, Estimated: 28 mL/min — ABNORMAL LOW (ref >60)
Glucose, Bld: 135 mg/dL — ABNORMAL HIGH (ref 70–99)
Potassium: 3.9 mmol/L (ref 3.5–5.1)
Sodium: 138 mmol/L (ref 135–145)
Total Bilirubin: 0.3 mg/dL (ref 0.3–1.2)
Total Protein: 7.3 g/dL (ref 6.5–8.1)

## 2021-04-25 LAB — SAMPLE TO BLOOD BANK

## 2021-04-26 LAB — KAPPA/LAMBDA LIGHT CHAINS
Kappa free light chain: 4.8 mg/L (ref 3.3–19.4)
Kappa, lambda light chain ratio: 0.52 (ref 0.26–1.65)
Lambda free light chains: 9.2 mg/L (ref 5.7–26.3)

## 2021-04-26 LAB — IGG, IGA, IGM
IgA: 1224 mg/dL — ABNORMAL HIGH (ref 64–422)
IgG (Immunoglobin G), Serum: 128 mg/dL — ABNORMAL LOW (ref 586–1602)
IgM (Immunoglobulin M), Srm: <5 mg/dL — ABNORMAL LOW (ref 26–217)

## 2021-05-07 NOTE — Progress Notes (Signed)
Patient ID: Heather Peterson, female    DOB: 07-06-1940, 80 y.o.   MRN: [id number]  Ms Bost is a 80 y/o female with a history of CAD, DM, HTN, CKD, thyroid disease, anxiety, depression, prolonged QT, current tobacco use and chronic heart failure.   Echo report from 06/27/20 reviewed and showed an EF of 25% along with moderate LAE. Echo report from 04/20/2019 reviewed and showed an EF of 40% along with mild TR and moderate MR.  Catheterization done 03/11/17 showed: Ost LM lesion, 50 %stenosed. - Eccentric Heavily Calcified lesion - MLA 7.5 mm2, FFR 0.93 - Not physiologically significant. Prox LAD to Dist LAD Stented Segment, 0 %stenosed. Dist LAD lesion, 90 %stenosed. Beyond Stent - Med Rx. Prox Cx to Mid Cx Stent, 0 %stenosed. Ost 2nd Mrg to 2nd Mrg lesion, 50 %stenosed. Beyond Stent  Admitted 02/12/21 due to generalized weakness and fatigue resulting in a fall. GI & oncology consults obtained. Upper EGD completed.  Multiple nonbleeding angioectasias treated with argon beam coagulation. Hypotensive initially limiting home diuretics but then able to be resumed. Midodrine increased. Discharged after 8 days. Was in the ED 12/15/20 due to a mechanical fall resulting in hitting her head on a cabinet. Evaluated and released.   She presents today for a follow-up visit with a chief complaint of moderate shortness of breath with minimal exertion. She describes this as chronic in nature having been present for several years. She has associated fatigue, cough, pedal edema, abdominal distention, weakness, easy bruising and gradual weight gain along with this. She denies any dizziness, palpitations or chest pain.   She is now taking her torsemide only PRN per nephrology recommendation. Says that she takes it if she feels more swollen than normal.   Past Medical History:  Diagnosis Date   Anxiety    Chronic combined systolic and diastolic CHF (congestive heart failure) (HCC)    Chronic kidney disease     Coronary artery disease    Depression    Diabetes mellitus without complication (HCC)    Diabetes mellitus, type II ([place])    Hypertension    MI (myocardial infarction) ([place])    x 5   Pacemaker    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HCC)    Prolonged Q-T interval on ECG    Thyroid disease    Past Surgical History:  Procedure Laterality Date   CENTRAL LINE INSERTION  03/11/2017   Procedure: CENTRAL LINE INSERTION;  Surgeon: Leonie Man, MD;  Location: [place] [facility];  Service: Cardiovascular;;   CHOLECYSTECTOMY     COLONOSCOPY WITH PROPOFOL N/A 09/01/2019   Procedure: COLONOSCOPY WITH PROPOFOL;  Surgeon: Toledo, Benay Pike, MD;  Location: [facility];  Service: Gastroenterology;  Laterality: N/A;   CORONARY STENT INTERVENTION W/IMPELLA N/A 03/11/2017   Procedure: Coronary Stent Intervention w/Impella;  Surgeon: Leonie Man, MD;  Location: [place] [facility];  Service: Cardiovascular;  Laterality: N/A;   ESOPHAGOGASTRODUODENOSCOPY (EGD) WITH PROPOFOL N/A 09/01/2019   Procedure: ESOPHAGOGASTRODUODENOSCOPY (EGD) WITH PROPOFOL;  Surgeon: Toledo, Benay Pike, MD;  Location: [facility];  Service: Gastroenterology;  Laterality: N/A;   ESOPHAGOGASTRODUODENOSCOPY (EGD) WITH PROPOFOL N/A 09/08/2019   Procedure: ESOPHAGOGASTRODUODENOSCOPY (EGD) WITH PROPOFOL;  Surgeon: [name], MD;  [facility]: [facility]COPY;  Service: Gastroenterology;  Laterality: N/A;   ESOPHAGOGASTRODUODENOSCOPY (EGD) WITH PROPOFOL N/A 02/15/2021   Procedure: ESOPHAGOGASTRODUODENOSCOPY (EGD) WITH PROPOFOL;  Surgeon: [name], MD;  Location: [facility] ENDOSCOPY;  Service: Gastroenterology;  Laterality: N/A;   EYE SURGERY  HIP ARTHROPLASTY Right 03/29/2020   Procedure: ARTHROPLASTY BIPOLAR HIP (HEMIARTHROPLASTY);  Surgeon: Corky Mull, MD;  Location: [facility];  Service: Orthopedics;  Laterality: Right;   INTRAVASCULAR PRESSURE WIRE/FFR STUDY N/A 03/11/2017   Procedure: INTRAVASCULAR PRESSURE WIRE/FFR STUDY;   Surgeon: Leonie Man, MD;  Location: [place] [facility];  Service: Cardiovascular;  Laterality: N/A;   INTRAVASCULAR ULTRASOUND/IVUS N/A 03/11/2017   Procedure: Intravascular Ultrasound/IVUS;  Surgeon: Leonie Man, MD;  Location: [place] [facility];  Service: Cardiovascular;  Laterality: N/A;   LEFT HEART CATH AND CORONARY ANGIOGRAPHY N/A 03/05/2017   Procedure: LEFT HEART CATH AND CORONARY ANGIOGRAPHY;  Surgeon: Isaias Cowman, MD;  Location: [place] [facility];  Service: Cardiovascular;  Laterality: N/A;   PACEMAKER IMPLANT     pacemaker/defibrillator Left    Family History  Problem Relation Age of Onset   Hypertension Father    Heart attack Father    Depression Sister    Depression Brother    Depression Brother    Social History   Tobacco Use   Smoking status: Every Day    Packs/day: 0.25    Types: E-cigarettes, Cigarettes   Smokeless tobacco: Never  Substance Use Topics   Alcohol use: Not Currently    Comment: occasionally   Allergies  Allergen Reactions   Celebrex [Celecoxib] Anaphylaxis   Glipizide Anaphylaxis   Levaquin [Levofloxacin In D5w] Other (See Comments)    Heart arrhthymias   Levofloxacin Other (See Comments) and Palpitations    ICD fired   Lisinopril Swelling    Lip and facial swelling   Sulfa Antibiotics Other (See Comments) and Anaphylaxis    Reaction: unknown   Metformin Other (See Comments)    Gi tolerance    Penicillins Rash and Other (See Comments)    Has patient had a PCN reaction causing immediate rash, facial/tongue/throat swelling, SOB or lightheadedness with hypotension: Unknown Has patient had a PCN reaction causing severe rash involving mucus membranes or skin necrosis: No Has patient had a PCN reaction that required hospitalization: No Has patient had a PCN reaction occurring within the last 10 years: No If all of the above answers are "NO", then may proceed with Cephalosporin use.    Prior to Admission medications    Medication Sig Start Date End Date Taking? Authorizing Provider  aspirin EC 81 MG tablet Take 81 mg by mouth at bedtime.    Yes [provider]  dexamethasone (DECADRON) 4 MG tablet Take 5 tablets (20 mg total) by mouth daily. Take the day after Velcade on days 2,5,9,12. Take with breakfast 11/14/20  Yes Lloyd Huger, MD  docusate sodium (COLACE) 100 MG capsule Take 100 mg by mouth 2 (two) times daily.   Yes [provider]  Ensure Max Protein (ENSURE MAX PROTEIN) LIQD Take 330 mLs (11 oz total) by mouth 2 (two) times daily between meals. 04/03/20  Yes [name], MD  fexofenadine (ALLEGRA) 180 MG tablet Take 180 mg by mouth daily.   Yes [provider]  FLUoxetine (PROZAC) 10 MG capsule Take 10 mg by mouth daily. 06/29/19  Yes [provider]  gabapentin (NEURONTIN) 600 MG tablet Take 600 mg by mouth daily. Patient takes 100 mg QAM, 700 mg QHS 11/07/20  Yes [provider]  hydrocortisone (ANUSOL-HC) 2.5 % rectal cream Apply 1 application topically 2 (two) times daily. 02/12/20  Yes [provider]  levothyroxine (SYNTHROID) 50 MCG tablet Take 50 mcg by mouth daily before breakfast. 08/14/20  Yes  [provider]  levothyroxine (SYNTHROID) 75 MCG tablet Take 1 tablet (75 mcg total) by mouth daily before breakfast. On Monday, Wednesday and Friday 04/03/20  Yes [name], MD  mexiletine (MEXITIL) 200 MG capsule Take 1 capsule (200 mg total) by mouth every 12 (twelve) hours. 03/14/17  Yes Seiler, Amber K, NP  OXYGEN Inhale 2 L into the lungs continuous.   Yes [provider]  pantoprazole (PROTONIX) 40 MG tablet Take 40 mg by mouth daily.  05/16/19  Yes [provider]  Probiotic Product (PROBIOTIC DAILY PO) Take 1 tablet by mouth daily.   Yes [provider]  spironolactone (ALDACTONE) 25 MG tablet Take 0.5 tablets (12.5 mg total) by mouth every other day. Patient taking differently: Take 25 mg by mouth  daily. 01/03/21  Yes Kassidy Dockendorf, Otila Kluver A, FNP  torsemide (DEMADEX) 20 MG tablet Take 40 mg by mouth as needed.   Yes [provider]  traZODone (DESYREL) 50 MG tablet Take 50 mg by mouth at bedtime.   Yes [provider]  gabapentin (NEURONTIN) 100 MG capsule TAKE 1 CAPSULE(100 MG) BY MOUTH THREE TIMES DAILY Patient not taking: Reported on 05/09/2021 12/29/20   Lloyd Huger, MD  Lactulose 20 GM/30ML SOLN Take 30 mLs (20 g total) by mouth in the morning and at bedtime. Patient not taking: Reported on 05/09/2021 06/23/20   Jacquelin Hawking, NP  polyethylene glycol (MIRALAX) 17 g packet Take 17 g by mouth daily. Patient not taking: Reported on 05/09/2021 06/18/20   Nance Pear, MD  SANTYL ointment Apply topically. Patient not taking: Reported on 05/09/2021 01/01/21   [provider]    Review of Systems  Constitutional:  Positive for fatigue. Negative for appetite change.  HENT:  Negative for congestion, postnasal drip and sore throat.   Eyes: Negative.   Respiratory:  Positive for cough and shortness of breath (on exertion). Negative for chest tightness.   Cardiovascular:  Positive for leg swelling. Negative for chest pain and palpitations.  Gastrointestinal:  Positive for abdominal distention. Negative for abdominal pain.  Endocrine: Negative.   Genitourinary: Negative.   Musculoskeletal:  Positive for arthralgias and back pain.  Skin: Negative.   Allergic/Immunologic: Negative.   Neurological:  Positive for weakness (leg). Negative for dizziness, syncope, light-headedness and numbness.  Hematological:  Negative for adenopathy. Bruises/bleeds easily.  Psychiatric/Behavioral:  Positive for sleep disturbance (difficulty staying asleep; wearing oxygen at 2L when asleep). Negative for dysphoric mood. The patient is not nervous/anxious.    Vitals:   05/09/21 1014  BP: 128/79  Pulse: 85  Resp: 18  SpO2: 98%  Weight: 168 lb (76.2 kg)  Height: 5' 3"  (1.6 m)    Wt Readings from Last 3 Encounters:  05/09/21 168 lb (76.2 kg)  03/28/21 158 lb (71.7 kg)  02/28/21 165 lb (74.8 kg)   Lab Results  Component Value Date   CREATININE 1.79 (H) 04/25/2021   CREATININE 1.54 (H) 03/28/2021   CREATININE 1.54 (H) 03/15/2021   Physical Exam Vitals and nursing note reviewed. Exam conducted with a chaperone present (son).  Constitutional:      Appearance: She is well-developed.  HENT:     Head: Normocephalic and atraumatic.  Neck:     Vascular: No JVD.  Cardiovascular:     Rate and Rhythm: Normal rate and regular rhythm.  Pulmonary:     Effort: Pulmonary effort is normal. No respiratory distress.     Breath sounds: No wheezing or rales.  Abdominal:  General: There is no distension.     Palpations: Abdomen is soft.     Tenderness: There is no abdominal tenderness.  Musculoskeletal:        General: No tenderness.     Cervical back: Normal range of motion and neck supple.     Right lower leg: No tenderness. Edema (2+ pitting) present.     Left lower leg: No tenderness. Edema (2+ pitting) present.  Skin:    General: Skin is warm and dry.  Neurological:     General: No focal deficit present.     Mental Status: She is alert and oriented to person, place, and time.  Psychiatric:        Mood and Affect: Mood normal.        Behavior: Behavior normal.   Assessment & Plan:  1: Chronic heart failure with reduced ejection fraction- - NYHA class III - weighing daily; reminded to call for an overnight weight gain of >2 pounds or a weekly weight gain of > 5 pounds - weight up 11 pounds from last visit here 3 months ago - not adding salt and she was encouraged to read food labels for sodium content carefully - saw cardiology Margarito Courser) 03/21/21 - has ICD present & it has fired in the past - only taking torsemide PRN for 2 pounds weight gain or increased pedal edema per nephrology - advised to take her torsemide daily for the next 2 days (due to weight  gain & edema) and also elevate legs when sitting for long periods of time - maintain fluid restriction of 32 ounces/ day - sees pulmonology Raul Del) 05/21/21 - BNP 03/28/20 was 318.3 - angioedema with lisinopril  2: HTN- - BP looks good (128/79) - saw PCP (Feldpausch) 02/28/21 - BMP 04/25/21 reviewed and showed sodium 1386, potassium 3.9, creatinine 1.79 and GFR 28  3: DM- - saw endocrinology Honor Junes) 04/04/21 - fasting glucose at home today was 120 - A1c 04/04/21 was 5.1% - saw nephrology (Kolluru) 04/23/21  4: Multiple myeloma- - Hemoglobin 04/25/21 was 8.6 - saw GI Jacqulyn Liner) 09/15/19 - saw oncology Grayland Ormond) 03/28/21   Patient did not bring her medications nor a list. Each medication was verbally reviewed with the patient and she was encouraged to bring the bottles to every visit to confirm accuracy of list.   Return in 2 months or sooner for any questions/problems before then.

## 2021-05-09 ENCOUNTER — Ambulatory Visit: Payer: Medicare Other | Attending: Family | Admitting: Family

## 2021-05-09 ENCOUNTER — Encounter: Payer: Self-pay | Admitting: Family

## 2021-05-09 ENCOUNTER — Other Ambulatory Visit: Payer: Self-pay

## 2021-05-09 VITALS — BP 128/79 | HR 85 | Resp 18 | Ht 63.0 in | Wt 168.0 lb

## 2021-05-09 DIAGNOSIS — Z79899 Other long term (current) drug therapy: Secondary | ICD-10-CM | POA: Insufficient documentation

## 2021-05-09 DIAGNOSIS — G479 Sleep disorder, unspecified: Secondary | ICD-10-CM | POA: Insufficient documentation

## 2021-05-09 DIAGNOSIS — Z955 Presence of coronary angioplasty implant and graft: Secondary | ICD-10-CM | POA: Diagnosis not present

## 2021-05-09 DIAGNOSIS — Z9181 History of falling: Secondary | ICD-10-CM | POA: Diagnosis not present

## 2021-05-09 DIAGNOSIS — Z95810 Presence of automatic (implantable) cardiac defibrillator: Secondary | ICD-10-CM | POA: Diagnosis not present

## 2021-05-09 DIAGNOSIS — E1122 Type 2 diabetes mellitus with diabetic chronic kidney disease: Secondary | ICD-10-CM | POA: Diagnosis not present

## 2021-05-09 DIAGNOSIS — R9431 Abnormal electrocardiogram [ECG] [EKG]: Secondary | ICD-10-CM | POA: Diagnosis not present

## 2021-05-09 DIAGNOSIS — N184 Chronic kidney disease, stage 4 (severe): Secondary | ICD-10-CM

## 2021-05-09 DIAGNOSIS — I5022 Chronic systolic (congestive) heart failure: Secondary | ICD-10-CM | POA: Diagnosis not present

## 2021-05-09 DIAGNOSIS — I130 Hypertensive heart and chronic kidney disease with heart failure and stage 1 through stage 4 chronic kidney disease, or unspecified chronic kidney disease: Secondary | ICD-10-CM | POA: Diagnosis present

## 2021-05-09 DIAGNOSIS — I10 Essential (primary) hypertension: Secondary | ICD-10-CM | POA: Diagnosis not present

## 2021-05-09 DIAGNOSIS — M2550 Pain in unspecified joint: Secondary | ICD-10-CM | POA: Diagnosis not present

## 2021-05-09 DIAGNOSIS — F419 Anxiety disorder, unspecified: Secondary | ICD-10-CM | POA: Insufficient documentation

## 2021-05-09 DIAGNOSIS — I2510 Atherosclerotic heart disease of native coronary artery without angina pectoris: Secondary | ICD-10-CM | POA: Insufficient documentation

## 2021-05-09 DIAGNOSIS — M549 Dorsalgia, unspecified: Secondary | ICD-10-CM | POA: Diagnosis not present

## 2021-05-09 DIAGNOSIS — C9000 Multiple myeloma not having achieved remission: Secondary | ICD-10-CM

## 2021-05-09 DIAGNOSIS — N189 Chronic kidney disease, unspecified: Secondary | ICD-10-CM | POA: Diagnosis not present

## 2021-05-09 DIAGNOSIS — F32A Depression, unspecified: Secondary | ICD-10-CM | POA: Insufficient documentation

## 2021-05-09 DIAGNOSIS — Z9981 Dependence on supplemental oxygen: Secondary | ICD-10-CM | POA: Insufficient documentation

## 2021-05-09 DIAGNOSIS — F17210 Nicotine dependence, cigarettes, uncomplicated: Secondary | ICD-10-CM | POA: Insufficient documentation

## 2021-05-09 DIAGNOSIS — Z794 Long term (current) use of insulin: Secondary | ICD-10-CM

## 2021-05-09 DIAGNOSIS — E079 Disorder of thyroid, unspecified: Secondary | ICD-10-CM | POA: Diagnosis not present

## 2021-05-09 NOTE — Patient Instructions (Addendum)
Continue weighing daily and call for an overnight weight gain of 3 pounds or more or a weekly weight gain of more than 5 pounds.    Take your torsemide daily for the next 2 days.

## 2021-05-20 NOTE — Progress Notes (Signed)
[place]  [phone] [phone]) [phone]  ID: Heather Peterson OB: Jun 02, 1940  MR#: [id number]  XVQ#:[id number]  [name], MD as PCP - General (Family Medicine) Lloyd Huger, MD as Consulting Physician (Oncology)  CHIEF COMPLAINT: Multiple myeloma, subdural hematoma.  INTERVAL HISTORY: Patient returns to clinic today for repeat laboratory work, further evaluation, and consideration of blood transfusion.  She continues to have chronic weakness and fatigue.  Her peripheral edema and CHF symptoms have essentially resolved.  Although she continues to have ascites and abdominal bloating.  She does not complain of any neuropathy today.  She has no neurologic complaints.  She has chronic shortness of breath, but is not wearing oxygen today. She denies any fevers.  She has a fair appetite.  She denies any chest pain, cough, or hemoptysis. She denies any nausea, vomiting, constipation or diarrhea.  She has no further abdominal pain.  She denies any melena or hematochezia.  She has no urinary complaints.  Patient offers no further specific complaints today.  REVIEW OF SYSTEMS:   Review of Systems  Constitutional:  Positive for malaise/fatigue. Negative for fever.  Respiratory:  Positive for shortness of breath. Negative for cough and hemoptysis.   Cardiovascular: Negative.  Negative for chest pain and leg swelling.  Gastrointestinal: Negative.  Negative for abdominal pain, constipation, diarrhea, melena and nausea.  Genitourinary: Negative.  Negative for dysuria and hematuria.  Musculoskeletal:  Positive for back pain. Negative for joint pain.  Skin: Negative.  Negative for rash.  Neurological:  Positive for weakness. Negative for dizziness, sensory change, focal weakness and headaches.  Psychiatric/Behavioral: Negative.  The patient is not nervous/anxious.    As per HPI. Otherwise, a complete review of systems is  negative.  PAST MEDICAL HISTORY: Past Medical History:  Diagnosis Date   Anxiety    Chronic combined systolic and diastolic CHF (congestive heart failure) (HCC)    Chronic kidney disease    Coronary artery disease    Depression    Diabetes mellitus without complication (HCC)    Diabetes mellitus, type II ([place])    Hypertension    MI (myocardial infarction) ([place])    x 5   Pacemaker    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[place])    Prolonged Q-T interval on ECG    Thyroid disease     PAST SURGICAL HISTORY: Past Surgical History:  Procedure Laterality Date   CENTRAL LINE INSERTION  03/11/2017   Procedure: CENTRAL LINE INSERTION;  Surgeon: Leonie Man, MD;  Location: [facility];  Service: Cardiovascular;;   CHOLECYSTECTOMY     COLONOSCOPY WITH PROPOFOL N/A 09/01/2019   Procedure: COLONOSCOPY WITH PROPOFOL;  Surgeon: Toledo, Benay Pike, MD;  Location: [facility];  Service: Gastroenterology;  Laterality: N/A;   CORONARY STENT INTERVENTION W/IMPELLA N/A 03/11/2017   Procedure: Coronary Stent Intervention w/Impella;  Surgeon: Leonie Man, MD;  Location: [facility];  Service: Cardiovascular;  Laterality: N/A;   ESOPHAGOGASTRODUODENOSCOPY (EGD) WITH PROPOFOL N/A 09/01/2019   Procedure: ESOPHAGOGASTRODUODENOSCOPY (EGD) WITH PROPOFOL;  Surgeon: Toledo, Benay Pike, MD;  Location: [facility] ENDOSCOPY;  Service: Gastroenterology;  Laterality: N/A;   ESOPHAGOGASTRODUODENOSCOPY (EGD) WITH PROPOFOL N/A 09/08/2019   Procedure: ESOPHAGOGASTRODUODENOSCOPY (EGD) WITH PROPOFOL;  Surgeon: [name], MD;  Location: [facility];  Service: Gastroenterology;  Laterality: N/A;   ESOPHAGOGASTRODUODENOSCOPY (EGD) WITH PROPOFOL N/A 02/15/2021   Procedure: ESOPHAGOGASTRODUODENOSCOPY (EGD) WITH PROPOFOL;  Surgeon: [name], MD;  Location: [facility] ENDOSCOPY;  Service: Gastroenterology;  Laterality: N/A;   EYE SURGERY     HIP ARTHROPLASTY Right 03/29/2020   Procedure: ARTHROPLASTY BIPOLAR HIP  (HEMIARTHROPLASTY);  Surgeon: Corky Mull, MD;  Location: [facility] ORS;  Service: Orthopedics;  Laterality: Right;   INTRAVASCULAR PRESSURE WIRE/FFR STUDY N/A 03/11/2017   Procedure: INTRAVASCULAR PRESSURE WIRE/FFR STUDY;  Surgeon: Leonie Man, MD;  Location: [place] [facility];  Service: Cardiovascular;  Laterality: N/A;   INTRAVASCULAR ULTRASOUND/IVUS N/A 03/11/2017   Procedure: Intravascular Ultrasound/IVUS;  Surgeon: Leonie Man, MD;  Location: [place] [facility];  Service: Cardiovascular;  Laterality: N/A;   LEFT HEART CATH AND CORONARY ANGIOGRAPHY N/A 03/05/2017   Procedure: LEFT HEART CATH AND CORONARY ANGIOGRAPHY;  Surgeon: Isaias Cowman, MD;  Location: [place] [facility];  Service: Cardiovascular;  Laterality: N/A;   PACEMAKER IMPLANT     pacemaker/defibrillator Left     FAMILY HISTORY: Family History  Problem Relation Age of Onset   Hypertension Father    Heart attack Father    Depression Sister    Depression Brother    Depression Brother     ADVANCED DIRECTIVES (Y/N):  N  HEALTH MAINTENANCE: Social History   Tobacco Use   Smoking status: Every Day    Packs/day: 0.25    Types: E-cigarettes, Cigarettes   Smokeless tobacco: Never  Vaping Use   Vaping Use: Former  Substance Use Topics   Alcohol use: Not Currently    Comment: occasionally   Drug use: Yes    Comment: prescribed pain meds     Colonoscopy:  PAP:  Bone density:  Lipid panel:  Allergies  Allergen Reactions   Celebrex [Celecoxib] Anaphylaxis   Glipizide Anaphylaxis   Levaquin [Levofloxacin In D5w] Other (See Comments)    Heart arrhthymias   Levofloxacin Other (See Comments) and Palpitations    ICD fired   Lisinopril Swelling    Lip and facial swelling   Sulfa Antibiotics Other (See Comments) and Anaphylaxis    Reaction: unknown   Metformin Other (See Comments)    Gi tolerance    Penicillins Rash and Other (See Comments)    Has patient had a PCN reaction causing immediate  rash, facial/tongue/throat swelling, SOB or lightheadedness with hypotension: Unknown Has patient had a PCN reaction causing severe rash involving mucus membranes or skin necrosis: No Has patient had a PCN reaction that required hospitalization: No Has patient had a PCN reaction occurring within the last 10 years: No If all of the above answers are "NO", then may proceed with Cephalosporin use.     Current Outpatient Medications  Medication Sig Dispense Refill   allopurinol (ZYLOPRIM) 300 MG tablet Take 300 mg by mouth daily.     aspirin EC 81 MG tablet Take 81 mg by mouth at bedtime.      colchicine 0.6 MG tablet Take by mouth.     dexamethasone (DECADRON) 4 MG tablet Take 5 tablets (20 mg total) by mouth daily. Take the day after Velcade on days 2,5,9,12. Take with breakfast 40 tablet 3   docusate sodium (COLACE) 100 MG capsule Take 100 mg by mouth 2 (two) times daily.     Ensure Max Protein (ENSURE MAX PROTEIN) LIQD Take 330 mLs (11 oz total) by mouth 2 (two) times daily between meals.     fexofenadine (ALLEGRA) 180 MG tablet Take 180 mg by mouth daily.     FLUoxetine (PROZAC) 10 MG capsule Take 10 mg by mouth daily.     gabapentin (NEURONTIN) 600 MG tablet Take  600 mg by mouth daily. Patient takes 100 mg QAM, 700 mg QHS     levothyroxine (SYNTHROID) 50 MCG tablet Take 50 mcg by mouth daily before breakfast.     levothyroxine (SYNTHROID) 75 MCG tablet Take 1 tablet (75 mcg total) by mouth daily before breakfast. On Monday, Wednesday and Friday     mexiletine (MEXITIL) 200 MG capsule Take 1 capsule (200 mg total) by mouth every 12 (twelve) hours. 60 capsule 1   mexiletine (MEXITIL) 200 MG capsule Take by mouth.     OXYGEN Inhale 2 L into the lungs continuous.     pantoprazole (PROTONIX) 40 MG tablet Take 40 mg by mouth daily.      Probiotic Product (PROBIOTIC DAILY PO) Take 1 tablet by mouth daily.     simvastatin (ZOCOR) 20 MG tablet Take 20 mg by mouth at bedtime.     spironolactone  (ALDACTONE) 25 MG tablet Take 0.5 tablets (12.5 mg total) by mouth every other day. (Patient taking differently: Take 25 mg by mouth daily.) 15 tablet 3   torsemide (DEMADEX) 20 MG tablet Take 40 mg by mouth as needed.     traZODone (DESYREL) 50 MG tablet Take 50 mg by mouth at bedtime.     gabapentin (NEURONTIN) 100 MG capsule TAKE 1 CAPSULE(100 MG) BY MOUTH THREE TIMES DAILY (Patient not taking: Reported on 05/09/2021) 270 capsule 1   hydrocortisone (ANUSOL-HC) 2.5 % rectal cream Apply 1 application topically 2 (two) times daily. (Patient not taking: Reported on 05/23/2021)     Lactulose 20 GM/30ML SOLN Take 30 mLs (20 g total) by mouth in the morning and at bedtime. (Patient not taking: Reported on 05/09/2021) 450 mL 1   polyethylene glycol (MIRALAX) 17 g packet Take 17 g by mouth daily. (Patient not taking: Reported on 05/09/2021) 14 each 0   SANTYL ointment Apply topically. (Patient not taking: Reported on 05/09/2021)     No current facility-administered medications for this visit.    OBJECTIVE: Vitals:   05/23/21 1113  BP: 103/64  Pulse: 85  Resp: 18  SpO2: 97%     Body mass index is 31.35 kg/m.    ECOG FS:1 - Symptomatic but completely ambulatory  General: Well-developed, well-nourished, no acute distress.  Sitting in a wheelchair. Eyes: Pink conjunctiva, anicteric sclera. HEENT: Normocephalic, moist mucous membranes. Lungs: No audible wheezing or coughing. Heart: Regular rate and rhythm. Abdomen: Mildly distended. Musculoskeletal: No edema, cyanosis, or clubbing. Neuro: Alert, answering all questions appropriately. Cranial nerves grossly intact. Skin: No rashes or petechiae noted. Psych: Normal affect.   LAB RESULTS:  Lab Results  Component Value Date   NA 133 (L) 05/23/2021   K 4.0 05/23/2021   CL 100 05/23/2021   CO2 21 (L) 05/23/2021   GLUCOSE 119 (H) 05/23/2021   BUN 54 (H) 05/23/2021   CREATININE 2.26 (H) 05/23/2021   CALCIUM 9.2 05/23/2021   PROT 6.9  05/23/2021   ALBUMIN 3.6 05/23/2021   AST 26 05/23/2021   ALT 18 05/23/2021   ALKPHOS 139 (H) 05/23/2021   BILITOT 0.4 05/23/2021   GFRNONAA 22 (L) 05/23/2021   GFRAA 30 (L) 09/09/2019    Lab Results  Component Value Date   WBC 9.1 05/23/2021   NEUTROABS 6.5 05/23/2021   HGB 7.5 (L) 05/23/2021   HCT 25.5 (L) 05/23/2021   MCV 90.4 05/23/2021   PLT 248 05/23/2021   Lab Results  Component Value Date   IRON 43 02/12/2021   TIBC 570 (H) 02/12/2021  IRONPCTSAT 8 (L) 02/12/2021   Lab Results  Component Value Date   FERRITIN 50 05/23/2020     STUDIES: No results found.  ASSESSMENT: Stage II multiple myeloma, fractured right hip.  PLAN:    1.  Stage II multiple myeloma: Bone marrow biopsy confirmed diagnosis with plasma cells up to 90% of biopsy.  Cytogenetics are reported as normal.  Metastatic bone survey from March 03, 2020 reviewed independently with multiple skeletal lucencies consistent with myeloma. Initial plan was to give Velcade on days 1, 4, 8, and 11 along with 10 mg Revlimid on days 1 through 14. Patient will also receive weekly 20 mg dexamethasone.  Revlimid was discontinued secondary to intolerance.  Patient did not receive treatment between June 20, 2020 and November 14, 2020.  Patient then had a second temporary discontinue treatment between December 12, 2020 and January 30, 2021.  She then received 1 additional cycle of Velcade only with her last treatment occurring on February 09, 2021.  No further treatments are planned at this time.  Her most recent M spike on March 28, 2021 stable at 0.5.  Her IgA levels have slowly increased and are now 1320. Kappa and lambda free light chains continue to be within normal limits.  Return to clinic in 1 month with repeat laboratory work, further evaluation, and consideration of additional blood.   2.  Anemia: Patient's hemoglobin has trended down to 7.5 and she is symptomatic.  Return to clinic tomorrow for 1 unit packed red  blood cells.  Likely multifactorial with chronic renal insufficiency, underlying myeloma, as well as history of recent GI bleed.  Patient underwent EGD recently cauterizing several gastric lesions.   3.  Chronic renal insufficiency: Creatinine slightly worse today.  Creatinine trended up to 2.26.  Continue follow-up with nephrology as scheduled.   4.  Ascites/peripheral edema: Peripheral edema has improved, although ascites is worse.  Patient has agreed to repeat paracentesis 5.  Angiodysplastic lesions: Continue follow-up with GI as scheduled.  EGD as above. 6.  Constipation: Patient does not complain of this today.  Continue bowel regimen as prescribed. 7.  Right hip fracture: Resolved. 8. Thrombocytopenia: Resolved. 9.  Peripheral neuropathy: Chronic and unchanged.  Continue 700 mg gabapentin at night in addition to 100 mg in the morning and in the afternoon.    Patient expressed understanding and was in agreement with this plan. She also understands that She can call clinic at any time with any questions, concerns, or complaints.    Lloyd Huger, MD   05/24/2021 4:00 PM

## 2021-05-23 ENCOUNTER — Other Ambulatory Visit: Payer: Self-pay

## 2021-05-23 ENCOUNTER — Inpatient Hospital Stay: Payer: Medicare Other | Attending: Oncology

## 2021-05-23 ENCOUNTER — Inpatient Hospital Stay (HOSPITAL_BASED_OUTPATIENT_CLINIC_OR_DEPARTMENT_OTHER): Payer: Medicare Other | Admitting: Oncology

## 2021-05-23 VITALS — BP 103/64 | HR 85 | Resp 18 | Wt 177.0 lb

## 2021-05-23 DIAGNOSIS — R0602 Shortness of breath: Secondary | ICD-10-CM | POA: Diagnosis not present

## 2021-05-23 DIAGNOSIS — I6200 Nontraumatic subdural hemorrhage, unspecified: Secondary | ICD-10-CM | POA: Insufficient documentation

## 2021-05-23 DIAGNOSIS — I252 Old myocardial infarction: Secondary | ICD-10-CM | POA: Insufficient documentation

## 2021-05-23 DIAGNOSIS — Z882 Allergy status to sulfonamides status: Secondary | ICD-10-CM | POA: Diagnosis not present

## 2021-05-23 DIAGNOSIS — Z8249 Family history of ischemic heart disease and other diseases of the circulatory system: Secondary | ICD-10-CM | POA: Insufficient documentation

## 2021-05-23 DIAGNOSIS — C9000 Multiple myeloma not having achieved remission: Secondary | ICD-10-CM | POA: Insufficient documentation

## 2021-05-23 DIAGNOSIS — I129 Hypertensive chronic kidney disease with stage 1 through stage 4 chronic kidney disease, or unspecified chronic kidney disease: Secondary | ICD-10-CM | POA: Diagnosis not present

## 2021-05-23 DIAGNOSIS — Z880 Allergy status to penicillin: Secondary | ICD-10-CM | POA: Insufficient documentation

## 2021-05-23 DIAGNOSIS — R188 Other ascites: Secondary | ICD-10-CM | POA: Insufficient documentation

## 2021-05-23 DIAGNOSIS — Z888 Allergy status to other drugs, medicaments and biological substances status: Secondary | ICD-10-CM | POA: Diagnosis not present

## 2021-05-23 DIAGNOSIS — K5900 Constipation, unspecified: Secondary | ICD-10-CM | POA: Insufficient documentation

## 2021-05-23 DIAGNOSIS — F17210 Nicotine dependence, cigarettes, uncomplicated: Secondary | ICD-10-CM | POA: Diagnosis not present

## 2021-05-23 DIAGNOSIS — R531 Weakness: Secondary | ICD-10-CM | POA: Insufficient documentation

## 2021-05-23 DIAGNOSIS — Z79899 Other long term (current) drug therapy: Secondary | ICD-10-CM | POA: Diagnosis not present

## 2021-05-23 DIAGNOSIS — N184 Chronic kidney disease, stage 4 (severe): Secondary | ICD-10-CM | POA: Insufficient documentation

## 2021-05-23 DIAGNOSIS — E1122 Type 2 diabetes mellitus with diabetic chronic kidney disease: Secondary | ICD-10-CM | POA: Diagnosis not present

## 2021-05-23 DIAGNOSIS — I2510 Atherosclerotic heart disease of native coronary artery without angina pectoris: Secondary | ICD-10-CM | POA: Diagnosis not present

## 2021-05-23 DIAGNOSIS — M5489 Other dorsalgia: Secondary | ICD-10-CM | POA: Insufficient documentation

## 2021-05-23 DIAGNOSIS — R140 Abdominal distension (gaseous): Secondary | ICD-10-CM | POA: Insufficient documentation

## 2021-05-23 DIAGNOSIS — Z9049 Acquired absence of other specified parts of digestive tract: Secondary | ICD-10-CM | POA: Diagnosis not present

## 2021-05-23 DIAGNOSIS — D649 Anemia, unspecified: Secondary | ICD-10-CM | POA: Diagnosis not present

## 2021-05-23 DIAGNOSIS — Z886 Allergy status to analgesic agent status: Secondary | ICD-10-CM | POA: Insufficient documentation

## 2021-05-23 DIAGNOSIS — Z818 Family history of other mental and behavioral disorders: Secondary | ICD-10-CM | POA: Insufficient documentation

## 2021-05-23 DIAGNOSIS — Z881 Allergy status to other antibiotic agents status: Secondary | ICD-10-CM | POA: Diagnosis not present

## 2021-05-23 LAB — COMPREHENSIVE METABOLIC PANEL
ALT: 18 U/L (ref 0–44)
AST: 26 U/L (ref 15–41)
Albumin: 3.6 g/dL (ref 3.5–5.0)
Alkaline Phosphatase: 139 U/L — ABNORMAL HIGH (ref 38–126)
Anion gap: 12 (ref 5–15)
BUN: 54 mg/dL — ABNORMAL HIGH (ref 8–23)
CO2: 21 mmol/L — ABNORMAL LOW (ref 22–32)
Calcium: 9.2 mg/dL (ref 8.9–10.3)
Chloride: 100 mmol/L (ref 98–111)
Creatinine, Ser: 2.26 mg/dL — ABNORMAL HIGH (ref 0.44–1.00)
GFR, Estimated: 22 mL/min — ABNORMAL LOW (ref >60)
Glucose, Bld: 119 mg/dL — ABNORMAL HIGH (ref 70–99)
Potassium: 4 mmol/L (ref 3.5–5.1)
Sodium: 133 mmol/L — ABNORMAL LOW (ref 135–145)
Total Bilirubin: 0.4 mg/dL (ref 0.3–1.2)
Total Protein: 6.9 g/dL (ref 6.5–8.1)

## 2021-05-23 LAB — CBC WITH DIFFERENTIAL/PLATELET
Abs Immature Granulocytes: 0.11 10*3/uL — ABNORMAL HIGH (ref 0.00–0.07)
Basophils Absolute: 0 10*3/uL (ref 0.0–0.1)
Basophils Relative: 0 %
Eosinophils Absolute: 0.1 10*3/uL (ref 0.0–0.5)
Eosinophils Relative: 1 %
HCT: 25.5 % — ABNORMAL LOW (ref 36.0–46.0)
Hemoglobin: 7.5 g/dL — ABNORMAL LOW (ref 12.0–15.0)
Immature Granulocytes: 1 %
Lymphocytes Relative: 13 %
Lymphs Abs: 1.2 10*3/uL (ref 0.7–4.0)
MCH: 26.6 pg (ref 26.0–34.0)
MCHC: 29.4 g/dL — ABNORMAL LOW (ref 30.0–36.0)
MCV: 90.4 fL (ref 80.0–100.0)
Monocytes Absolute: 1.1 10*3/uL — ABNORMAL HIGH (ref 0.1–1.0)
Monocytes Relative: 13 %
Neutro Abs: 6.5 10*3/uL (ref 1.7–7.7)
Neutrophils Relative %: 72 %
Platelets: 248 10*3/uL (ref 150–400)
RBC: 2.82 MIL/uL — ABNORMAL LOW (ref 3.87–5.11)
RDW: 19.3 % — ABNORMAL HIGH (ref 11.5–15.5)
WBC: 9.1 10*3/uL (ref 4.0–10.5)
nRBC: 0.6 % — ABNORMAL HIGH (ref 0.0–0.2)

## 2021-05-23 LAB — SAMPLE TO BLOOD BANK

## 2021-05-23 LAB — PREPARE RBC (CROSSMATCH)

## 2021-05-23 NOTE — Progress Notes (Signed)
Patient reports that he stomach always feels bloated, fatigued, trouble walking, back and forth constipation and diarrhea, and SOB.

## 2021-05-24 ENCOUNTER — Encounter: Payer: Self-pay | Admitting: Nurse Practitioner

## 2021-05-24 ENCOUNTER — Inpatient Hospital Stay: Payer: Medicare Other

## 2021-05-24 ENCOUNTER — Encounter: Payer: Self-pay | Admitting: Oncology

## 2021-05-24 DIAGNOSIS — C9000 Multiple myeloma not having achieved remission: Secondary | ICD-10-CM | POA: Diagnosis not present

## 2021-05-24 LAB — KAPPA/LAMBDA LIGHT CHAINS
Kappa free light chain: 5.1 mg/L (ref 3.3–19.4)
Kappa, lambda light chain ratio: 0.42 (ref 0.26–1.65)
Lambda free light chains: 12.1 mg/L (ref 5.7–26.3)

## 2021-05-24 LAB — IGG, IGA, IGM
IgA: 1320 mg/dL — ABNORMAL HIGH (ref 64–422)
IgG (Immunoglobin G), Serum: 118 mg/dL — ABNORMAL LOW (ref 586–1602)
IgM (Immunoglobulin M), Srm: <5 mg/dL — ABNORMAL LOW (ref 26–217)

## 2021-05-24 MED ORDER — SODIUM CHLORIDE 0.9% IV SOLUTION
250.0000 mL | Freq: Once | INTRAVENOUS | Status: AC
  Administered 2021-05-24: 250 mL via INTRAVENOUS
  Filled 2021-05-24: qty 250

## 2021-05-24 MED ORDER — DIPHENHYDRAMINE HCL 50 MG/ML IJ SOLN
25.0000 mg | Freq: Once | INTRAMUSCULAR | Status: AC
  Administered 2021-05-24: 25 mg via INTRAVENOUS
  Filled 2021-05-24: qty 1

## 2021-05-24 MED ORDER — ACETAMINOPHEN 325 MG PO TABS
650.0000 mg | ORAL_TABLET | Freq: Once | ORAL | Status: AC
  Administered 2021-05-24: 650 mg via ORAL
  Filled 2021-05-24: qty 2

## 2021-05-24 NOTE — Progress Notes (Signed)
Left hand bruising noted from attempted IV access. Pressure dressing applied. Beckey Rutter, NP notified and assessment of hand performed. Discharged home with hand wrapped and instructed to patient to keep on for approximately an hour. Family aware.

## 2021-05-24 NOTE — Patient Instructions (Signed)
Blood Transfusion, Adult, Care After This sheet gives you information about how to care for yourself after your procedure. Your doctor may also give you more specific instructions. If you have problems or questions, contact your doctor. What can I expect after the procedure? After the procedure, it is common to have: Bruising and soreness at the IV site. A headache. Follow these instructions at home: Insertion site care   Follow instructions from your doctor about how to take care of your insertion site. This is where an IV tube was put into your vein. Make sure you: Wash your hands with soap and water before and after you change your bandage (dressing). If you cannot use soap and water, use hand sanitizer. Change your bandage as told by your doctor. Check your insertion site every day for signs of infection. Check for: Redness, swelling, or pain. Bleeding from the site. Warmth. Pus or a bad smell. General instructions Take over-the-counter and prescription medicines only as told by your doctor. Rest as told by your doctor. Go back to your normal activities as told by your doctor. Keep all follow-up visits as told by your doctor. This is important. Contact a doctor if: You have itching or red, swollen areas of skin (hives). You feel worried or nervous (anxious). You feel weak after doing your normal activities. You have redness, swelling, warmth, or pain around the insertion site. You have blood coming from the insertion site, and the blood does not stop with pressure. You have pus or a bad smell coming from the insertion site. Get help right away if: You have signs of a serious reaction. This may be coming from an allergy or the body's defense system (immune system). Signs include: Trouble breathing or shortness of breath. Swelling of the face or feeling warm (flushed). Fever or chills. Head, chest, or back pain. Dark pee (urine) or blood in the pee. Widespread rash. Fast  heartbeat. Feeling dizzy or light-headed. You may receive your blood transfusion in an outpatient setting. If so, you will be told whom to contact to report any reactions. These symptoms may be an emergency. Do not wait to see if the symptoms will go away. Get medical help right away. Call your local emergency services (911 in the [place] Do not drive yourself to the hospital. Summary Bruising and soreness at the IV site are common. Check your insertion site every day for signs of infection. Rest as told by your doctor. Go back to your normal activities as told by your doctor. Get help right away if you have signs of a serious reaction. This information is not intended to replace advice given to you by your health care provider. Make sure you discuss any questions you have with your health care provider. Document Revised: 08/24/2020 Document Reviewed: 10/22/2018 Elsevier Patient Education  2022 Elsevier Inc.  

## 2021-05-25 ENCOUNTER — Encounter: Payer: Self-pay | Admitting: Oncology

## 2021-05-25 LAB — TYPE AND SCREEN
ABO/RH(D): B POS
Antibody Screen: NEGATIVE
Unit division: 0
Unit division: 0

## 2021-05-25 LAB — BPAM RBC
Blood Product Expiration Date: 202301172359
Blood Product Expiration Date: 202301242359
ISSUE DATE / TIME: 202301120951
Unit Type and Rh: 9500
Unit Type and Rh: 9500

## 2021-05-25 NOTE — Progress Notes (Signed)
Called to infusion to evaluate patient. Iv of left hand failed and patient was noted to have hematoma. Nursing applied pressure bandage. New IV was started in right hand. I evaluate patient who had stopped bleeding. Recommended pressure wrap be removed and regular bandage applied. Reviewed hematoma monitoring w/ patient.

## 2021-05-30 ENCOUNTER — Telehealth: Payer: Self-pay | Admitting: Emergency Medicine

## 2021-05-30 NOTE — Telephone Encounter (Signed)
Returned call to patient with Scheduling information for paracentesis. Pt verbalized understanding to arrive at [place] at 2pm for 2:30pm appt on 05/31/2021.

## 2021-05-30 NOTE — Telephone Encounter (Signed)
Per secure chat, pt requested to be contacted. Pt wanted to follow up on paracentesis as discussed with Dr. Grayland Ormond. Order had been placed on the date of visit.  Checklist was faxed and patient called back to apologize for the delay and someone will contact her with scheduling information as soon as possible.

## 2021-05-31 ENCOUNTER — Other Ambulatory Visit: Payer: Self-pay

## 2021-05-31 ENCOUNTER — Ambulatory Visit
Admission: RE | Admit: 2021-05-31 | Discharge: 2021-05-31 | Disposition: A | Discharge status: Home / Self Care | Payer: Medicare Other | Source: Ambulatory Visit | Attending: Oncology | Admitting: Oncology

## 2021-05-31 DIAGNOSIS — C9000 Multiple myeloma not having achieved remission: Secondary | ICD-10-CM | POA: Insufficient documentation

## 2021-05-31 NOTE — Procedures (Signed)
PROCEDURE SUMMARY:  Successful image-guided paracentesis from the right lower abdomen.  Yielded 4.8 liters of hazy yellow fluid.  No immediate complications.  EBL < 1 mL Patient tolerated well.   Specimen was not sent for labs.  Please see imaging section of Epic for full dictation.  Joaquim Nam PA-C 05/31/2021 3:25 PM

## 2021-06-13 ENCOUNTER — Telehealth: Payer: Self-pay | Admitting: *Deleted

## 2021-06-13 ENCOUNTER — Telehealth: Payer: Self-pay

## 2021-06-13 ENCOUNTER — Other Ambulatory Visit: Payer: Self-pay | Admitting: Family

## 2021-06-13 ENCOUNTER — Ambulatory Visit
Admission: RE | Admit: 2021-06-13 | Discharge: 2021-06-13 | Disposition: A | Discharge status: Home / Self Care | Payer: Medicare Other | Source: Ambulatory Visit | Attending: Family | Admitting: Family

## 2021-06-13 ENCOUNTER — Other Ambulatory Visit: Payer: Self-pay

## 2021-06-13 DIAGNOSIS — R188 Other ascites: Secondary | ICD-10-CM

## 2021-06-13 NOTE — Telephone Encounter (Signed)
Per Dr. Grayland Ormond, pt should contact the heart failure clinic as she has significant history of heart failure. Called daughter to inform her of this and she verbalized understanding. Also informed daughter that refill for pain medication could not be authorized, as pt does not have an active order for this. She verbalized understanding and stated she would discuss with MD at next appointment.

## 2021-06-13 NOTE — Telephone Encounter (Signed)
Her PHYSICAL THERAPIST called stating that her abdomen is enlarged and that she recently had a paracentesis (05/31/21) and no they have not contacted her cardiologist. Is that what you would like me to tell them?

## 2021-06-13 NOTE — Progress Notes (Signed)
7 pound weight gain with abdominal distention noted. Had [place] and paracentesis done 05/31/21. Offered out patient IV lasix but daughter says that lasix tends to drop her BP and would prefer to do the [place] and see if ascites has re-accumulated this quickly. Order has been placed and patient will be called back with date/time of ultrasound

## 2021-06-13 NOTE — Procedures (Signed)
PROCEDURE SUMMARY:  Successful US guided paracentesis from RLQ.  Yielded 3.6 L of yellow colored fluid.  No immediate complications.  Pt tolerated well.   Specimen was not sent for labs.  EBL < 12mL  Tsosie Billing D PA-C 06/13/2021 4:00 PM

## 2021-06-13 NOTE — Telephone Encounter (Signed)
Patient's daughter, [name], called to report that her mother has had an overnight weight gain of 7 lbs. States she is experiencing acute abdominal distention and tenderness. Denies swelling or pain in any other location Patient had a similar episode of abdominal distention in January relieved by paracentesis, per daughter.  Provider, Darylene Price, [place], has ordered ultrasound paracentesis and ultrasound can schedule the patient at 1:30 PM today if patient can make it.  Daughter verbalized appreciation for quick appointment and states they can make it by 1:30 PM for paracentesis appointment. Daughter informed that patient has a follow up appointment in our office next Tuesday, 06/19/21. We will keep that appointment at this time, and patient should call us back if she has any concerns or questions after paracentesis and before that appointment. Patient's daughter verbalized understanding. Georg Ruddle, RN

## 2021-06-13 NOTE — Telephone Encounter (Addendum)
Received back to back calls From [facility] then Mr Monger reporting that patient has pain in her neck and her legs as well as a 7 pound weight gain over night and inability to walk this morning. Mr Rizzolo feels her fluid has come back. Please advise Request is made to return the call to husband

## 2021-06-19 ENCOUNTER — Other Ambulatory Visit: Payer: Self-pay

## 2021-06-19 ENCOUNTER — Ambulatory Visit: Payer: Medicare Other | Attending: Family | Admitting: Family

## 2021-06-19 ENCOUNTER — Encounter: Payer: Self-pay | Admitting: Family

## 2021-06-19 VITALS — BP 91/63 | HR 80 | Resp 16 | Ht 63.0 in | Wt 175.2 lb

## 2021-06-19 DIAGNOSIS — Z955 Presence of coronary angioplasty implant and graft: Secondary | ICD-10-CM | POA: Diagnosis not present

## 2021-06-19 DIAGNOSIS — C9000 Multiple myeloma not having achieved remission: Secondary | ICD-10-CM

## 2021-06-19 DIAGNOSIS — I130 Hypertensive heart and chronic kidney disease with heart failure and stage 1 through stage 4 chronic kidney disease, or unspecified chronic kidney disease: Secondary | ICD-10-CM | POA: Insufficient documentation

## 2021-06-19 DIAGNOSIS — I5022 Chronic systolic (congestive) heart failure: Secondary | ICD-10-CM | POA: Insufficient documentation

## 2021-06-19 DIAGNOSIS — E1122 Type 2 diabetes mellitus with diabetic chronic kidney disease: Secondary | ICD-10-CM | POA: Insufficient documentation

## 2021-06-19 DIAGNOSIS — E079 Disorder of thyroid, unspecified: Secondary | ICD-10-CM | POA: Insufficient documentation

## 2021-06-19 DIAGNOSIS — G479 Sleep disorder, unspecified: Secondary | ICD-10-CM | POA: Insufficient documentation

## 2021-06-19 DIAGNOSIS — N189 Chronic kidney disease, unspecified: Secondary | ICD-10-CM | POA: Diagnosis not present

## 2021-06-19 DIAGNOSIS — F17210 Nicotine dependence, cigarettes, uncomplicated: Secondary | ICD-10-CM | POA: Insufficient documentation

## 2021-06-19 DIAGNOSIS — Z794 Long term (current) use of insulin: Secondary | ICD-10-CM

## 2021-06-19 DIAGNOSIS — I10 Essential (primary) hypertension: Secondary | ICD-10-CM

## 2021-06-19 DIAGNOSIS — F32A Depression, unspecified: Secondary | ICD-10-CM | POA: Insufficient documentation

## 2021-06-19 DIAGNOSIS — Z95810 Presence of automatic (implantable) cardiac defibrillator: Secondary | ICD-10-CM | POA: Diagnosis not present

## 2021-06-19 DIAGNOSIS — Z79899 Other long term (current) drug therapy: Secondary | ICD-10-CM | POA: Diagnosis not present

## 2021-06-19 DIAGNOSIS — R9431 Abnormal electrocardiogram [ECG] [EKG]: Secondary | ICD-10-CM | POA: Insufficient documentation

## 2021-06-19 DIAGNOSIS — I2510 Atherosclerotic heart disease of native coronary artery without angina pectoris: Secondary | ICD-10-CM | POA: Insufficient documentation

## 2021-06-19 DIAGNOSIS — N184 Chronic kidney disease, stage 4 (severe): Secondary | ICD-10-CM

## 2021-06-19 DIAGNOSIS — Z9981 Dependence on supplemental oxygen: Secondary | ICD-10-CM | POA: Insufficient documentation

## 2021-06-19 DIAGNOSIS — F419 Anxiety disorder, unspecified: Secondary | ICD-10-CM | POA: Insufficient documentation

## 2021-06-19 DIAGNOSIS — M549 Dorsalgia, unspecified: Secondary | ICD-10-CM | POA: Diagnosis not present

## 2021-06-19 DIAGNOSIS — M2550 Pain in unspecified joint: Secondary | ICD-10-CM | POA: Diagnosis not present

## 2021-06-19 NOTE — Patient Instructions (Addendum)
The [facility] Clinic will be moving around the corner to [address] mid-February. Our phone number will remain the same.  Try to continue to restrict your water intake and sodium intake.  Prop your feet up when you can.   Weigh yourself daily and record. If you take torsemide TWICE/day, indicate this on your log.   Return in 3 months.

## 2021-06-19 NOTE — Progress Notes (Signed)
[place]  [phone] [phone]  ID: Beola Vasallo Carawan OB: 1941/03/05  MR#: [id number]  LXB#:[id number]  [name], MD as PCP - General (Family Medicine) Lloyd Huger, MD as Consulting Physician (Oncology)  CHIEF COMPLAINT: Multiple myeloma, subdural hematoma.  INTERVAL HISTORY: Patient returns to clinic today for repeat laboratory work and further evaluation.  She underwent paracentesis recently with improvement of her abdominal swelling.  She was recently seen in the heart failure clinic as well.  She has chronic weakness and fatigue. She does not complain of any neuropathy today.  She has no neurologic complaints.  She has chronic shortness of breath, but is not wearing oxygen today. She denies any fevers.  She has a fair appetite.  She denies any chest pain, cough, or hemoptysis. She denies any nausea, vomiting, constipation or diarrhea.  She has no further abdominal pain.  She denies any melena or hematochezia.  She has no urinary complaints.  Patient offers no further specific complaints today.  REVIEW OF SYSTEMS:   Review of Systems  Constitutional:  Positive for malaise/fatigue. Negative for fever.  Respiratory:  Positive for shortness of breath. Negative for cough and hemoptysis.   Cardiovascular:  Positive for leg swelling. Negative for chest pain.  Gastrointestinal: Negative.  Negative for abdominal pain, constipation, diarrhea, melena and nausea.  Genitourinary: Negative.  Negative for dysuria and hematuria.  Musculoskeletal:  Positive for back pain. Negative for joint pain.  Skin: Negative.  Negative for rash.  Neurological:  Positive for weakness. Negative for dizziness, sensory change, focal weakness and headaches.  Psychiatric/Behavioral: Negative.  The patient is not nervous/anxious.    As per HPI. Otherwise, a complete review of systems is negative.  PAST MEDICAL HISTORY: Past Medical  History:  Diagnosis Date   Anxiety    Chronic combined systolic and diastolic CHF (congestive heart failure) (HCC)    Chronic kidney disease    Coronary artery disease    Depression    Diabetes mellitus without complication (HCC)    Diabetes mellitus, type II ([place])    Hypertension    MI (myocardial infarction) ([place])    x 5   Pacemaker    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[place])    Prolonged Q-T interval on ECG    Thyroid disease     PAST SURGICAL HISTORY: Past Surgical History:  Procedure Laterality Date   CENTRAL LINE INSERTION  03/11/2017   Procedure: CENTRAL LINE INSERTION;  Surgeon: Leonie Man, MD;  Location: [facility];  Service: Cardiovascular;;   CHOLECYSTECTOMY     COLONOSCOPY WITH PROPOFOL N/A 09/01/2019   Procedure: COLONOSCOPY WITH PROPOFOL;  Surgeon: Toledo, Benay Pike, MD;  Location: [facility];  Service: Gastroenterology;  Laterality: N/A;   CORONARY STENT INTERVENTION W/IMPELLA N/A 03/11/2017   Procedure: Coronary Stent Intervention w/Impella;  Surgeon: Leonie Man, MD;  Location: [facility];  Service: Cardiovascular;  Laterality: N/A;   ESOPHAGOGASTRODUODENOSCOPY (EGD) WITH PROPOFOL N/A 09/01/2019   Procedure: ESOPHAGOGASTRODUODENOSCOPY (EGD) WITH PROPOFOL;  Surgeon: Toledo, Benay Pike, MD;  Location: [facility];  Service: Gastroenterology;  Laterality: N/A;   ESOPHAGOGASTRODUODENOSCOPY (EGD) WITH PROPOFOL N/A 09/08/2019   Procedure: ESOPHAGOGASTRODUODENOSCOPY (EGD) WITH PROPOFOL;  Surgeon: [name], MD;  Location: [facility];  Service: Gastroenterology;  Laterality: N/A;   ESOPHAGOGASTRODUODENOSCOPY (EGD) WITH PROPOFOL N/A 02/15/2021   Procedure: ESOPHAGOGASTRODUODENOSCOPY (EGD) WITH PROPOFOL;  Surgeon: [name], MD;  Location: [facility];  Service: Gastroenterology;  Laterality: N/A;  EYE SURGERY     HIP ARTHROPLASTY Right 03/29/2020   Procedure: ARTHROPLASTY BIPOLAR HIP (HEMIARTHROPLASTY);  Surgeon: Corky Mull, MD;   Location: [facility];  Service: Orthopedics;  Laterality: Right;   INTRAVASCULAR PRESSURE WIRE/FFR STUDY N/A 03/11/2017   Procedure: INTRAVASCULAR PRESSURE WIRE/FFR STUDY;  Surgeon: Leonie Man, MD;  Location: [place] [facility];  Service: Cardiovascular;  Laterality: N/A;   INTRAVASCULAR ULTRASOUND/IVUS N/A 03/11/2017   Procedure: Intravascular Ultrasound/IVUS;  Surgeon: Leonie Man, MD;  Location: [facility];  Service: Cardiovascular;  Laterality: N/A;   LEFT HEART CATH AND CORONARY ANGIOGRAPHY N/A 03/05/2017   Procedure: LEFT HEART CATH AND CORONARY ANGIOGRAPHY;  Surgeon: Isaias Cowman, MD;  Location: [place];  Service: Cardiovascular;  Laterality: N/A;   PACEMAKER IMPLANT     pacemaker/defibrillator Left     FAMILY HISTORY: Family History  Problem Relation Age of Onset   Hypertension Father    Heart attack Father    Depression Sister    Depression Brother    Depression Brother     ADVANCED DIRECTIVES (Y/N):  N  HEALTH MAINTENANCE: Social History   Tobacco Use   Smoking status: Every Day    Packs/day: 0.25    Types: E-cigarettes, Cigarettes   Smokeless tobacco: Never  Vaping Use   Vaping Use: Former  Substance Use Topics   Alcohol use: Not Currently    Comment: occasionally   Drug use: Yes    Comment: prescribed pain meds     Colonoscopy:  PAP:  Bone density:  Lipid panel:  Allergies  Allergen Reactions   Celebrex [Celecoxib] Anaphylaxis   Glipizide Anaphylaxis   Levaquin [Levofloxacin In D5w] Other (See Comments)    Heart arrhthymias   Levofloxacin Other (See Comments) and Palpitations    ICD fired   Lisinopril Swelling    Lip and facial swelling   Sulfa Antibiotics Other (See Comments) and Anaphylaxis    Reaction: unknown   Metformin Other (See Comments)    Gi tolerance    Penicillins Rash and Other (See Comments)    Has patient had a PCN reaction causing immediate rash, facial/tongue/throat swelling, SOB or  lightheadedness with hypotension: Unknown Has patient had a PCN reaction causing severe rash involving mucus membranes or skin necrosis: No Has patient had a PCN reaction that required hospitalization: No Has patient had a PCN reaction occurring within the last 10 years: No If all of the above answers are "NO", then may proceed with Cephalosporin use.     Current Outpatient Medications  Medication Sig Dispense Refill   allopurinol (ZYLOPRIM) 300 MG tablet Take 300 mg by mouth daily.     aspirin EC 81 MG tablet Take 81 mg by mouth at bedtime.      colchicine 0.6 MG tablet Take by mouth.     dexamethasone (DECADRON) 4 MG tablet Take 5 tablets (20 mg total) by mouth daily. Take the day after Velcade on days 2,5,9,12. Take with breakfast 40 tablet 3   docusate sodium (COLACE) 100 MG capsule Take 100 mg by mouth 2 (two) times daily.     Ensure Max Protein (ENSURE MAX PROTEIN) LIQD Take 330 mLs (11 oz total) by mouth 2 (two) times daily between meals.     fexofenadine (ALLEGRA) 180 MG tablet Take 180 mg by mouth daily.     FLUoxetine (PROZAC) 10 MG capsule Take 10 mg by mouth daily.     gabapentin (NEURONTIN) 100 MG capsule TAKE 1 CAPSULE(100 MG) BY  MOUTH THREE TIMES DAILY (Patient not taking: Reported on 05/09/2021) 270 capsule 1   gabapentin (NEURONTIN) 600 MG tablet Take 600 mg by mouth daily. Patient takes 100 mg QAM, 700 mg QHS     hydrocortisone (ANUSOL-HC) 2.5 % rectal cream Apply 1 application topically 2 (two) times daily. (Patient not taking: Reported on 05/23/2021)     Lactulose 20 GM/30ML SOLN Take 30 mLs (20 g total) by mouth in the morning and at bedtime. (Patient not taking: Reported on 05/09/2021) 450 mL 1   levothyroxine (SYNTHROID) 50 MCG tablet Take 50 mcg by mouth daily before breakfast.     levothyroxine (SYNTHROID) 75 MCG tablet Take 1 tablet (75 mcg total) by mouth daily before breakfast. On Monday, Wednesday and Friday     mexiletine (MEXITIL) 200 MG capsule Take 1 capsule  (200 mg total) by mouth every 12 (twelve) hours. 60 capsule 1   mexiletine (MEXITIL) 200 MG capsule Take by mouth.     midodrine (PROAMATINE) 10 MG tablet Take 10 mg by mouth 3 (three) times daily.     OXYGEN Inhale 2 L into the lungs continuous.     pantoprazole (PROTONIX) 40 MG tablet Take 40 mg by mouth daily.      polyethylene glycol (MIRALAX) 17 g packet Take 17 g by mouth daily. (Patient not taking: Reported on 05/09/2021) 14 each 0   Probiotic Product (PROBIOTIC DAILY PO) Take 1 tablet by mouth daily.     SANTYL ointment Apply topically. (Patient not taking: Reported on 05/09/2021)     simvastatin (ZOCOR) 20 MG tablet Take 20 mg by mouth at bedtime.     spironolactone (ALDACTONE) 25 MG tablet Take 0.5 tablets (12.5 mg total) by mouth every other day. (Patient not taking: Reported on 06/20/2021) 15 tablet 3   torsemide (DEMADEX) 20 MG tablet Take 40 mg by mouth as needed.     traZODone (DESYREL) 50 MG tablet Take 50 mg by mouth at bedtime.     No current facility-administered medications for this visit.    OBJECTIVE: Vitals:   06/20/21 1100  BP: (!) 84/55  Pulse: 73  Resp: 18  Temp: (!) 97 F (36.1 C)     Body mass index is 30.82 kg/m.    ECOG FS:1 - Symptomatic but completely ambulatory  General: Well-developed, well-nourished, no acute distress.  Sitting in a wheelchair. Eyes: Pink conjunctiva, anicteric sclera. HEENT: Normocephalic, moist mucous membranes. Lungs: No audible wheezing or coughing. Heart: Regular rate and rhythm. Abdomen: Soft, nontender, no obvious distention. Musculoskeletal: Scant to 1+ edema. Neuro: Alert, answering all questions appropriately. Cranial nerves grossly intact. Skin: No rashes or petechiae noted. Psych: Normal affect.   LAB RESULTS:  Lab Results  Component Value Date   NA 135 06/20/2021   K 4.3 06/20/2021   CL 99 06/20/2021   CO2 27 06/20/2021   GLUCOSE 110 (H) 06/20/2021   BUN 35 (H) 06/20/2021   CREATININE 2.16 (H) 06/20/2021    CALCIUM 8.5 (L) 06/20/2021   PROT 6.6 06/20/2021   ALBUMIN 3.2 (L) 06/20/2021   AST 14 (L) 06/20/2021   ALT 8 06/20/2021   ALKPHOS 135 (H) 06/20/2021   BILITOT 0.4 06/20/2021   GFRNONAA 23 (L) 06/20/2021   GFRAA 30 (L) 09/09/2019    Lab Results  Component Value Date   WBC 9.6 06/20/2021   NEUTROABS 6.8 06/20/2021   HGB 8.4 (L) 06/20/2021   HCT 28.1 (L) 06/20/2021   MCV 94.3 06/20/2021   PLT 202 06/20/2021  Lab Results  Component Value Date   IRON 43 02/12/2021   TIBC 570 (H) 02/12/2021   IRONPCTSAT 8 (L) 02/12/2021   Lab Results  Component Value Date   FERRITIN 50 05/23/2020     STUDIES: US Paracentesis  Result Date: 06/14/2021 INDICATION: Recurrent ascites request received for therapeutic paracentesis. EXAM: ULTRASOUND GUIDED  PARACENTESIS MEDICATIONS: Local 1% lidocaine only. COMPLICATIONS: None immediate. PROCEDURE: Informed written consent was obtained from the patient after a discussion of the risks, benefits and alternatives to treatment. A timeout was performed prior to the initiation of the procedure. Initial ultrasound scanning demonstrates a moderate amount of ascites within the right lower abdominal quadrant. The right lower abdomen was prepped and draped in the usual sterile fashion. 1% lidocaine was used for local anesthesia. Following this, a 19 gauge, 7-cm, Yueh catheter was introduced. An ultrasound image was saved for documentation purposes. The paracentesis was performed. The catheter was removed and a dressing was applied. The patient tolerated the procedure well without immediate post procedural complication. FINDINGS: A total of approximately 3.6 L of yellow-colored fluid was removed. IMPRESSION: Successful ultrasound-guided paracentesis yielding 3.6 liters of peritoneal fluid. This exam was performed by Tsosie Billing PA-C, and was supervised and interpreted by Dr. Denna Haggard. Electronically Signed   By: Albin Felling M.D.   On: 06/14/2021 07:48   US  Paracentesis  Result Date: 06/01/2021 INDICATION: Patient history of multiple myeloma chronic renal insufficiency, recurrent ascites. Request to IR for therapeutic paracentesis. EXAM: ULTRASOUND GUIDED THERAPEUTIC PARACENTESIS MEDICATIONS: 8 mL 1% lidocaine COMPLICATIONS: None immediate. PROCEDURE: Informed written consent was obtained from the patient after a discussion of the risks, benefits and alternatives to treatment. A timeout was performed prior to the initiation of the procedure. Initial ultrasound scanning demonstrates a large amount of ascites within the right lower abdominal quadrant. The right lower abdomen was prepped and draped in the usual sterile fashion. 1% lidocaine was used for local anesthesia. Following this, a 19 gauge, 7-cm, Yueh catheter was introduced. An ultrasound image was saved for documentation purposes. The paracentesis was performed. The catheter was removed and a dressing was applied. The patient tolerated the procedure well without immediate post procedural complication. FINDINGS: A total of approximately 4.8 L of hazy yellow fluid was removed. IMPRESSION: Successful ultrasound-guided paracentesis yielding 4.8 liters of peritoneal fluid. Read by Candiss Norse, PA-C Electronically Signed   By: Ruthann Cancer M.D.   On: 06/01/2021 08:19    ASSESSMENT: Stage II multiple myeloma, fractured right hip.  PLAN:    1.  Stage II multiple myeloma: Bone marrow biopsy confirmed diagnosis with plasma cells up to 90% of biopsy.  Cytogenetics are reported as normal.  Metastatic bone survey from March 03, 2020 reviewed independently with multiple skeletal lucencies consistent with myeloma. Initial plan was to give Velcade on days 1, 4, 8, and 11 along with 10 mg Revlimid on days 1 through 14. Patient will also receive weekly 20 mg dexamethasone.  Revlimid was discontinued secondary to intolerance.  Patient did not receive treatment between June 20, 2020 and November 14, 2020.  Patient  then had a second temporary discontinue treatment between December 12, 2020 and January 30, 2021.  She then received 1 additional cycle of Velcade only with her last treatment occurring on February 09, 2021.  No further treatments are planned at this time.  Her most recent M spike on March 28, 2021 stable at 0.5, today's result is pending.  Her IgA levels initially trended up, but have been  relatively stable over the past 2 months with her most recent result being 1289.  Kappa and lambda free light chains continue to be within normal limits.  Return to clinic in 6 weeks with repeat laboratory work and further evaluation.   2.  Anemia: Likely multifactorial with chronic renal insufficiency, underlying myeloma, as well as history of recent GI bleed.  Patient underwent EGD recently cauterizing several gastric lesions.  Her most recent hemoglobin is 8.4, therefore she does not require blood transfusion.  Return to clinic in 6 weeks as above and will consider transfusion if patient's hemoglobin falls below 8.0. 3.  Chronic renal insufficiency: Creatinine slightly improved to 2.16 today.  Continue follow-up with nephrology as scheduled.   4.  Ascites/peripheral edema: Patient has required 2 paracentesis over the past month.  Continue follow-up in heart failure clinic and with nephrology. 5.  Angiodysplastic lesions: Continue follow-up with GI as scheduled.  EGD as above. 6.  Constipation: Patient does not complain of this today.  Continue bowel regimen as prescribed. 7.  Right hip fracture: Resolved. 8. Thrombocytopenia: Resolved. 9.  Peripheral neuropathy: Chronic and unchanged.  Continue 700 mg gabapentin at night in addition to 100 mg in the morning and in the afternoon.    Patient expressed understanding and was in agreement with this plan. She also understands that She can call clinic at any time with any questions, concerns, or complaints.    Lloyd Huger, MD   06/21/2021 12:06 PM

## 2021-06-19 NOTE — Progress Notes (Signed)
Patient ID: Heather Peterson, female    DOB: 1940-06-03, 81 y.o.   MRN: [id number]  Heather Peterson is a 81 y/o female with a history of CAD, DM, HTN, CKD, thyroid disease, anxiety, depression, prolonged QT, current tobacco use and chronic heart failure.   Echo report from 06/27/20 reviewed and showed an EF of 25% along with moderate LAE. Echo report from 04/20/2019 reviewed and showed an EF of 40% along with mild TR and moderate MR.  Catheterization done 03/11/17 showed: Ost LM lesion, 50 %stenosed. - Eccentric Heavily Calcified lesion - MLA 7.5 mm2, FFR 0.93 - Not physiologically significant. Prox LAD to Dist LAD Stented Segment, 0 %stenosed. Dist LAD lesion, 90 %stenosed. Beyond Stent - Med Rx. Prox Cx to Mid Cx Stent, 0 %stenosed. Ost 2nd Mrg to 2nd Mrg lesion, 50 %stenosed. Beyond Stent  Admitted 02/12/21 due to generalized weakness and fatigue resulting in a fall. GI & oncology consults obtained. Upper EGD completed.  Multiple nonbleeding angioectasias treated with argon beam coagulation. Hypotensive initially limiting home diuretics but then able to be resumed. Midodrine increased. Discharged after 8 days. Was in the ED 12/15/20 due to a mechanical fall resulting in hitting her head on a cabinet. Evaluated and released.   She presents today for a follow-up visit with a chief complaint of moderate shortness of breath with minimal exertion. She describes this as chronic in nature having been present for several years. She has associated fatigue, cough, pedal edema, abdominal distention, weakness, easy bruising and gradual weight gain along with this. She denies any dizziness, palpitations or chest pain.   She had a ultrasound guided paracentesis on 06/13/21 following an increased weight gain. Family called the office to report that previous lasix infusion was not tolerated well so paracentesis was arranged. 3.6 liters was removed.  She reports since last week, she feels "somewhat better". She has  been weighing daily, although she does not record her weights and cannot recall what they have been over the last week. Nephrology had changed her torsemide to take PRN for weight gain of 2 lbs. Over the last week she has taken it every day and twice she took it BID.   Past Medical History:  Diagnosis Date   Anxiety    Chronic combined systolic and diastolic CHF (congestive heart failure) (HCC)    Chronic kidney disease    Coronary artery disease    Depression    Diabetes mellitus without complication (HCC)    Diabetes mellitus, type II ([place])    Hypertension    MI (myocardial infarction) ([place])    x 5   Pacemaker    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HCC)    Prolonged Q-T interval on ECG    Thyroid disease    Past Surgical History:  Procedure Laterality Date   CENTRAL LINE INSERTION  03/11/2017   Procedure: CENTRAL LINE INSERTION;  Surgeon: Leonie Man, MD;  Location: [facility];  Service: Cardiovascular;;   CHOLECYSTECTOMY     COLONOSCOPY WITH PROPOFOL N/A 09/01/2019   Procedure: COLONOSCOPY WITH PROPOFOL;  Surgeon: Toledo, Benay Pike, MD;  Location: [facility];  Service: Gastroenterology;  Laterality: N/A;   CORONARY STENT INTERVENTION W/IMPELLA N/A 03/11/2017   Procedure: Coronary Stent Intervention w/Impella;  Surgeon: Leonie Man, MD;  Location: [facility];  Service: Cardiovascular;  Laterality: N/A;   ESOPHAGOGASTRODUODENOSCOPY (EGD) WITH PROPOFOL N/A 09/01/2019   Procedure: ESOPHAGOGASTRODUODENOSCOPY (EGD) WITH PROPOFOL;  Surgeon: Alice Reichert, Benay Pike, MD;  Location:  [facility];  Service: Gastroenterology;  Laterality: N/A;   ESOPHAGOGASTRODUODENOSCOPY (EGD) WITH PROPOFOL N/A 09/08/2019   Procedure: ESOPHAGOGASTRODUODENOSCOPY (EGD) WITH PROPOFOL;  Surgeon: [name], MD;  Location: [facility];  Service: Gastroenterology;  Laterality: N/A;   ESOPHAGOGASTRODUODENOSCOPY (EGD) WITH PROPOFOL N/A 02/15/2021   Procedure: ESOPHAGOGASTRODUODENOSCOPY (EGD)  WITH PROPOFOL;  Surgeon: [name], MD;  Location: [facility]COPY;  Service: Gastroenterology;  Laterality: N/A;   EYE SURGERY     HIP ARTHROPLASTY Right 03/29/2020   Procedure: ARTHROPLASTY BIPOLAR HIP (HEMIARTHROPLASTY);  Surgeon: Corky Mull, MD;  Location: [facility];  Service: Orthopedics;  Laterality: Right;   INTRAVASCULAR PRESSURE WIRE/FFR STUDY N/A 03/11/2017   Procedure: INTRAVASCULAR PRESSURE WIRE/FFR STUDY;  Surgeon: Leonie Man, MD;  Location: [place] [facility];  Service: Cardiovascular;  Laterality: N/A;   INTRAVASCULAR ULTRASOUND/IVUS N/A 03/11/2017   Procedure: Intravascular Ultrasound/IVUS;  Surgeon: Leonie Man, MD;  Location: [place] [facility] [facility];  Service: Cardiovascular;  Laterality: N/A;   LEFT HEART CATH AND CORONARY ANGIOGRAPHY N/A 03/05/2017   Procedure: LEFT HEART CATH AND CORONARY ANGIOGRAPHY;  Surgeon: Isaias Cowman, MD;  Location: [place] [facility];  Service: Cardiovascular;  Laterality: N/A;   PACEMAKER IMPLANT     pacemaker/defibrillator Left    Family History  Problem Relation Age of Onset   Hypertension Father    Heart attack Father    Depression Sister    Depression Brother    Depression Brother    Social History   Tobacco Use   Smoking status: Every Day    Packs/day: 0.25    Types: E-cigarettes, Cigarettes   Smokeless tobacco: Never  Substance Use Topics   Alcohol use: Not Currently    Comment: occasionally   Allergies  Allergen Reactions   Celebrex [Celecoxib] Anaphylaxis   Glipizide Anaphylaxis   Levaquin [Levofloxacin In D5w] Other (See Comments)    Heart arrhthymias   Levofloxacin Other (See Comments) and Palpitations    ICD fired   Lisinopril Swelling    Lip and facial swelling   Sulfa Antibiotics Other (See Comments) and Anaphylaxis    Reaction: unknown   Metformin Other (See Comments)    Gi tolerance    Penicillins Rash and Other (See Comments)    Has patient had a PCN reaction causing immediate rash,  facial/tongue/throat swelling, SOB or lightheadedness with hypotension: Unknown Has patient had a PCN reaction causing severe rash involving mucus membranes or skin necrosis: No Has patient had a PCN reaction that required hospitalization: No Has patient had a PCN reaction occurring within the last 10 years: No If all of the above answers are "NO", then may proceed with Cephalosporin use.    Prior to Admission medications   Medication Sig Start Date End Date Taking? Authorizing Provider  aspirin EC 81 MG tablet Take 81 mg by mouth at bedtime.    Yes [provider]  dexamethasone (DECADRON) 4 MG tablet Take 5 tablets (20 mg total) by mouth daily. Take the day after Velcade on days 2,5,9,12. Take with breakfast 11/14/20  Yes Lloyd Huger, MD  docusate sodium (COLACE) 100 MG capsule Take 100 mg by mouth 2 (two) times daily.   Yes [provider]  Ensure Max Protein (ENSURE MAX PROTEIN) LIQD Take 330 mLs (11 oz total) by mouth 2 (two) times daily between meals. 04/03/20  Yes [name], MD  fexofenadine (ALLEGRA) 180 MG tablet Take 180 mg by mouth daily.   Yes [provider]  FLUoxetine (PROZAC) 10 MG capsule  Take 10 mg by mouth daily. 06/29/19  Yes [provider]  gabapentin (NEURONTIN) 600 MG tablet Take 600 mg by mouth daily. Patient takes 100 mg QAM, 700 mg QHS 11/07/20  Yes [provider]  hydrocortisone (ANUSOL-HC) 2.5 % rectal cream Apply 1 application topically 2 (two) times daily. 02/12/20  Yes [provider]  levothyroxine (SYNTHROID) 50 MCG tablet Take 50 mcg by mouth daily before breakfast. 08/14/20  Yes [provider]  levothyroxine (SYNTHROID) 75 MCG tablet Take 1 tablet (75 mcg total) by mouth daily before breakfast. On Monday, Wednesday and Friday 04/03/20  Yes [name], MD  mexiletine (MEXITIL) 200 MG capsule Take 1 capsule (200 mg total) by mouth every 12 (twelve) hours. 03/14/17  Yes Seiler, Amber K, NP   OXYGEN Inhale 2 L into the lungs continuous.   Yes [provider]  pantoprazole (PROTONIX) 40 MG tablet Take 40 mg by mouth daily.  05/16/19  Yes [provider]  Probiotic Product (PROBIOTIC DAILY PO) Take 1 tablet by mouth daily.   Yes [provider]  spironolactone (ALDACTONE) 25 MG tablet Take 0.5 tablets (12.5 mg total) by mouth every other day. Patient taking differently: Take 25 mg by mouth daily. 01/03/21  Yes Hackney, Otila Kluver A, FNP  torsemide (DEMADEX) 20 MG tablet Take 40 mg by mouth as needed.   Yes [provider]  traZODone (DESYREL) 50 MG tablet Take 50 mg by mouth at bedtime.   Yes [provider]  gabapentin (NEURONTIN) 100 MG capsule TAKE 1 CAPSULE(100 MG) BY MOUTH THREE TIMES DAILY Patient not taking: Reported on 05/09/2021 12/29/20   Lloyd Huger, MD  Lactulose 20 GM/30ML SOLN Take 30 mLs (20 g total) by mouth in the morning and at bedtime. Patient not taking: Reported on 05/09/2021 06/23/20   Jacquelin Hawking, NP  polyethylene glycol (MIRALAX) 17 g packet Take 17 g by mouth daily. Patient not taking: Reported on 05/09/2021 06/18/20   Nance Pear, MD  SANTYL ointment Apply topically. Patient not taking: Reported on 05/09/2021 01/01/21   [provider]    Review of Systems  Constitutional:  Positive for fatigue. Negative for appetite change.  HENT:  Negative for congestion, postnasal drip and sore throat.   Eyes: Negative.   Respiratory:  Positive for cough and shortness of breath. Negative for chest tightness.   Cardiovascular:  Positive for leg swelling. Negative for chest pain and palpitations.  Gastrointestinal:  Positive for abdominal distention. Negative for abdominal pain.  Endocrine: Negative.   Genitourinary: Negative.   Musculoskeletal:  Positive for arthralgias and back pain.  Skin: Negative.   Allergic/Immunologic: Negative.   Neurological:  Positive for weakness (leg). Negative for dizziness,  syncope, light-headedness and numbness.  Hematological:  Negative for adenopathy. Bruises/bleeds easily.  Psychiatric/Behavioral:  Positive for sleep disturbance (difficulty staying asleep; wearing oxygen at 2L when asleep). Negative for dysphoric mood. The patient is not nervous/anxious.    Vitals:   06/19/21 1024  BP: 91/63  Pulse: 80  Resp: 16  SpO2: 91%  Weight: 175 lb 4 oz (79.5 kg)  Height: _0  (1.6 m)   Wt Readings from Last 3 Encounters:  06/19/21 175 lb 4 oz (79.5 kg)  05/23/21 177 lb (80.3 kg)  05/09/21 168 lb (76.2 kg)   Lab Results  Component Value Date   CREATININE 2.26 (H) 05/23/2021   CREATININE 1.79 (H) 04/25/2021   CREATININE 1.54 (H) 03/28/2021   Physical Exam Vitals and nursing note reviewed. Exam  conducted with a chaperone present (son).  Constitutional:      General: She is not in acute distress.    Appearance: She is well-developed. She is ill-appearing.  HENT:     Head: Normocephalic and atraumatic.  Neck:     Vascular: No JVD.  Cardiovascular:     Rate and Rhythm: Normal rate and regular rhythm.  Pulmonary:     Effort: Pulmonary effort is normal. No respiratory distress.     Breath sounds: Rales present. No wheezing.  Abdominal:     General: There is no distension.     Palpations: Abdomen is soft.     Tenderness: There is no abdominal tenderness.  Musculoskeletal:        General: No tenderness.     Cervical back: Normal range of motion and neck supple.     Right lower leg: No tenderness. Edema (2+ pitting) present.     Left lower leg: No tenderness. Edema (2+ pitting) present.  Skin:    General: Skin is warm and dry.  Neurological:     General: No focal deficit present.     Mental Status: She is alert and oriented to person, place, and time.  Psychiatric:        Mood and Affect: Mood normal.        Behavior: Behavior normal.   Assessment & Plan:  1: Chronic heart failure with reduced ejection fraction- - NYHA class III - slightly  volume overloaded today, but reports she feels better since paracentesis last week  - weighing daily; reminded to call for an overnight weight gain of >2 pounds or a weekly weight gain of > 5 lbs - not adding salt and she was encouraged to read food labels for sodium content carefully - saw cardiology Margarito Courser) 03/21/21 - has ICD present, has not fired > 2 years - taking torsemide 20 mg QD, and sometimes twice/day, but is ordered PRN for wt gain of 2 lbs - reiterated recording her weight daily since she has been self adjusting her torsemide - maintaining fluid restriction of 32 ounces/ day - saw pulmonology Raul Del) 05/21/21 - BNP 03/28/20 was 318.3 - angioedema with lisinopril - defer labs until oncology appointment on 06/20/21  2: HTN- - BP 91/63 - saw PCP (Feldpausch) 06/05/21, BP in office 98/58 - returns on 07/05/21 - BMP 05/23/21 Na 133, K 4.0, Cr 2.26, GFR 22  3: DM- - saw endocrinology Honor Junes) 04/04/21 - A1c 04/04/21 was 5.1%  4: CKD - saw nephrology 04/23/21, torsemide changed to PRN for 2 lb wt gain.  - f/u on 07/24/21  5: Multiple myeloma- - Hemoglobin 05/23/21 was 7.5 - saw oncology Grayland Ormond) 05/23/21, will return on 06/20/21  Patient did not bring her medications nor a list. Each medication was verbally reviewed with the patient and she was encouraged to bring the bottles to every visit to confirm accuracy of list.   Return in 3 months or sooner for any questions/problems before then.

## 2021-06-20 ENCOUNTER — Other Ambulatory Visit: Payer: Self-pay

## 2021-06-20 ENCOUNTER — Inpatient Hospital Stay: Payer: Medicare Other | Attending: Oncology

## 2021-06-20 ENCOUNTER — Inpatient Hospital Stay (HOSPITAL_BASED_OUTPATIENT_CLINIC_OR_DEPARTMENT_OTHER): Payer: Medicare Other | Admitting: Oncology

## 2021-06-20 VITALS — BP 84/55 | HR 73 | Temp 97.0°F | Resp 18 | Wt 174.0 lb

## 2021-06-20 DIAGNOSIS — C9000 Multiple myeloma not having achieved remission: Secondary | ICD-10-CM | POA: Diagnosis present

## 2021-06-20 DIAGNOSIS — Z7982 Long term (current) use of aspirin: Secondary | ICD-10-CM | POA: Insufficient documentation

## 2021-06-20 DIAGNOSIS — D649 Anemia, unspecified: Secondary | ICD-10-CM | POA: Insufficient documentation

## 2021-06-20 DIAGNOSIS — N189 Chronic kidney disease, unspecified: Secondary | ICD-10-CM | POA: Diagnosis not present

## 2021-06-20 DIAGNOSIS — E1122 Type 2 diabetes mellitus with diabetic chronic kidney disease: Secondary | ICD-10-CM | POA: Insufficient documentation

## 2021-06-20 DIAGNOSIS — Z7952 Long term (current) use of systemic steroids: Secondary | ICD-10-CM | POA: Insufficient documentation

## 2021-06-20 DIAGNOSIS — F17210 Nicotine dependence, cigarettes, uncomplicated: Secondary | ICD-10-CM | POA: Insufficient documentation

## 2021-06-20 DIAGNOSIS — Z79899 Other long term (current) drug therapy: Secondary | ICD-10-CM | POA: Insufficient documentation

## 2021-06-20 DIAGNOSIS — I5042 Chronic combined systolic (congestive) and diastolic (congestive) heart failure: Secondary | ICD-10-CM | POA: Diagnosis not present

## 2021-06-20 DIAGNOSIS — I130 Hypertensive heart and chronic kidney disease with heart failure and stage 1 through stage 4 chronic kidney disease, or unspecified chronic kidney disease: Secondary | ICD-10-CM | POA: Insufficient documentation

## 2021-06-20 DIAGNOSIS — R0602 Shortness of breath: Secondary | ICD-10-CM | POA: Insufficient documentation

## 2021-06-20 LAB — COMPREHENSIVE METABOLIC PANEL
ALT: 8 U/L (ref 0–44)
AST: 14 U/L — ABNORMAL LOW (ref 15–41)
Albumin: 3.2 g/dL — ABNORMAL LOW (ref 3.5–5.0)
Alkaline Phosphatase: 135 U/L — ABNORMAL HIGH (ref 38–126)
Anion gap: 9 (ref 5–15)
BUN: 35 mg/dL — ABNORMAL HIGH (ref 8–23)
CO2: 27 mmol/L (ref 22–32)
Calcium: 8.5 mg/dL — ABNORMAL LOW (ref 8.9–10.3)
Chloride: 99 mmol/L (ref 98–111)
Creatinine, Ser: 2.16 mg/dL — ABNORMAL HIGH (ref 0.44–1.00)
GFR, Estimated: 23 mL/min — ABNORMAL LOW (ref >60)
Glucose, Bld: 110 mg/dL — ABNORMAL HIGH (ref 70–99)
Potassium: 4.3 mmol/L (ref 3.5–5.1)
Sodium: 135 mmol/L (ref 135–145)
Total Bilirubin: 0.4 mg/dL (ref 0.3–1.2)
Total Protein: 6.6 g/dL (ref 6.5–8.1)

## 2021-06-20 LAB — CBC WITH DIFFERENTIAL/PLATELET
Abs Immature Granulocytes: 0.17 10*3/uL — ABNORMAL HIGH (ref 0.00–0.07)
Basophils Absolute: 0.1 10*3/uL (ref 0.0–0.1)
Basophils Relative: 1 %
Eosinophils Absolute: 0.1 10*3/uL (ref 0.0–0.5)
Eosinophils Relative: 2 %
HCT: 28.1 % — ABNORMAL LOW (ref 36.0–46.0)
Hemoglobin: 8.4 g/dL — ABNORMAL LOW (ref 12.0–15.0)
Immature Granulocytes: 2 %
Lymphocytes Relative: 13 %
Lymphs Abs: 1.3 10*3/uL (ref 0.7–4.0)
MCH: 28.2 pg (ref 26.0–34.0)
MCHC: 29.9 g/dL — ABNORMAL LOW (ref 30.0–36.0)
MCV: 94.3 fL (ref 80.0–100.0)
Monocytes Absolute: 1.2 10*3/uL — ABNORMAL HIGH (ref 0.1–1.0)
Monocytes Relative: 12 %
Neutro Abs: 6.8 10*3/uL (ref 1.7–7.7)
Neutrophils Relative %: 70 %
Platelets: 202 10*3/uL (ref 150–400)
RBC: 2.98 MIL/uL — ABNORMAL LOW (ref 3.87–5.11)
RDW: 21.9 % — ABNORMAL HIGH (ref 11.5–15.5)
WBC: 9.6 10*3/uL (ref 4.0–10.5)
nRBC: 0.4 % — ABNORMAL HIGH (ref 0.0–0.2)

## 2021-06-20 LAB — SAMPLE TO BLOOD BANK

## 2021-06-20 NOTE — Progress Notes (Signed)
Pt here for follow up. Reports she pulled a muscle in her L arm.

## 2021-06-21 ENCOUNTER — Inpatient Hospital Stay: Payer: Medicare Other

## 2021-06-21 ENCOUNTER — Encounter: Payer: Self-pay | Admitting: Oncology

## 2021-06-21 LAB — IGG, IGA, IGM
IgA: 1289 mg/dL — ABNORMAL HIGH (ref 64–422)
IgG (Immunoglobin G), Serum: 103 mg/dL — ABNORMAL LOW (ref 586–1602)
IgM (Immunoglobulin M), Srm: <5 mg/dL — ABNORMAL LOW (ref 26–217)

## 2021-06-22 LAB — PROTEIN ELECTROPHORESIS, SERUM
A/G Ratio: 1.1 (ref 0.7–1.7)
Albumin ELP: 3.3 g/dL (ref 2.9–4.4)
Alpha-1-Globulin: 0.3 g/dL (ref 0.0–0.4)
Alpha-2-Globulin: 0.7 g/dL (ref 0.4–1.0)
Beta Globulin: 1.5 g/dL — ABNORMAL HIGH (ref 0.7–1.3)
Gamma Globulin: 0.3 g/dL — ABNORMAL LOW (ref 0.4–1.8)
Globulin, Total: 2.9 g/dL (ref 2.2–3.9)
M-Spike, %: 0.7 g/dL — ABNORMAL HIGH
Total Protein ELP: 6.2 g/dL (ref 6.0–8.5)

## 2021-06-26 ENCOUNTER — Emergency Department: Payer: Medicare Other

## 2021-06-26 ENCOUNTER — Telehealth: Payer: Self-pay | Admitting: Family

## 2021-06-26 ENCOUNTER — Encounter: Payer: Self-pay | Admitting: Emergency Medicine

## 2021-06-26 ENCOUNTER — Other Ambulatory Visit: Payer: Self-pay

## 2021-06-26 ENCOUNTER — Telehealth: Payer: Self-pay

## 2021-06-26 ENCOUNTER — Other Ambulatory Visit: Payer: Self-pay | Admitting: Family

## 2021-06-26 ENCOUNTER — Emergency Department
Admission: EM | Admit: 2021-06-26 | Discharge: 2021-06-26 | Disposition: A | Discharge status: Home / Self Care | Payer: Medicare Other | Source: Home / Self Care | Attending: Emergency Medicine | Admitting: Emergency Medicine

## 2021-06-26 DIAGNOSIS — I2510 Atherosclerotic heart disease of native coronary artery without angina pectoris: Secondary | ICD-10-CM | POA: Insufficient documentation

## 2021-06-26 DIAGNOSIS — I129 Hypertensive chronic kidney disease with stage 1 through stage 4 chronic kidney disease, or unspecified chronic kidney disease: Secondary | ICD-10-CM | POA: Insufficient documentation

## 2021-06-26 DIAGNOSIS — R188 Other ascites: Secondary | ICD-10-CM

## 2021-06-26 DIAGNOSIS — K652 Spontaneous bacterial peritonitis: Secondary | ICD-10-CM | POA: Diagnosis not present

## 2021-06-26 DIAGNOSIS — N189 Chronic kidney disease, unspecified: Secondary | ICD-10-CM | POA: Insufficient documentation

## 2021-06-26 DIAGNOSIS — R4182 Altered mental status, unspecified: Secondary | ICD-10-CM | POA: Insufficient documentation

## 2021-06-26 DIAGNOSIS — K767 Hepatorenal syndrome: Secondary | ICD-10-CM | POA: Diagnosis not present

## 2021-06-26 LAB — CBC WITH DIFFERENTIAL/PLATELET
Abs Immature Granulocytes: 0.11 10*3/uL — ABNORMAL HIGH (ref 0.00–0.07)
Basophils Absolute: 0.1 10*3/uL (ref 0.0–0.1)
Basophils Relative: 1 %
Eosinophils Absolute: 0 10*3/uL (ref 0.0–0.5)
Eosinophils Relative: 0 %
HCT: 25.4 % — ABNORMAL LOW (ref 36.0–46.0)
Hemoglobin: 7.4 g/dL — ABNORMAL LOW (ref 12.0–15.0)
Immature Granulocytes: 1 %
Lymphocytes Relative: 7 %
Lymphs Abs: 0.7 10*3/uL (ref 0.7–4.0)
MCH: 27.6 pg (ref 26.0–34.0)
MCHC: 29.1 g/dL — ABNORMAL LOW (ref 30.0–36.0)
MCV: 94.8 fL (ref 80.0–100.0)
Monocytes Absolute: 1.2 10*3/uL — ABNORMAL HIGH (ref 0.1–1.0)
Monocytes Relative: 11 %
Neutro Abs: 8.9 10*3/uL — ABNORMAL HIGH (ref 1.7–7.7)
Neutrophils Relative %: 80 %
Platelets: 231 10*3/uL (ref 150–400)
RBC: 2.68 MIL/uL — ABNORMAL LOW (ref 3.87–5.11)
RDW: 22.5 % — ABNORMAL HIGH (ref 11.5–15.5)
WBC: 11 10*3/uL — ABNORMAL HIGH (ref 4.0–10.5)
nRBC: 0.5 % — ABNORMAL HIGH (ref 0.0–0.2)

## 2021-06-26 LAB — COMPREHENSIVE METABOLIC PANEL
ALT: 9 U/L (ref 0–44)
AST: 18 U/L (ref 15–41)
Albumin: 3.3 g/dL — ABNORMAL LOW (ref 3.5–5.0)
Alkaline Phosphatase: 114 U/L (ref 38–126)
Anion gap: 11 (ref 5–15)
BUN: 52 mg/dL — ABNORMAL HIGH (ref 8–23)
CO2: 24 mmol/L (ref 22–32)
Calcium: 9.2 mg/dL (ref 8.9–10.3)
Chloride: 100 mmol/L (ref 98–111)
Creatinine, Ser: 2.49 mg/dL — ABNORMAL HIGH (ref 0.44–1.00)
GFR, Estimated: 19 mL/min — ABNORMAL LOW (ref >60)
Glucose, Bld: 117 mg/dL — ABNORMAL HIGH (ref 70–99)
Potassium: 4.5 mmol/L (ref 3.5–5.1)
Sodium: 135 mmol/L (ref 135–145)
Total Bilirubin: 0.6 mg/dL (ref 0.3–1.2)
Total Protein: 6.5 g/dL (ref 6.5–8.1)

## 2021-06-26 LAB — LACTIC ACID, PLASMA: Lactic Acid, Venous: 1.1 mmol/L (ref 0.5–1.9)

## 2021-06-26 LAB — AMMONIA: Ammonia: 18 umol/L (ref 9–35)

## 2021-06-26 LAB — MAGNESIUM: Magnesium: 2.3 mg/dL (ref 1.7–2.4)

## 2021-06-26 LAB — TSH: TSH: 3.435 u[IU]/mL (ref 0.350–4.500)

## 2021-06-26 LAB — T4, FREE: Free T4: 0.42 ng/dL — ABNORMAL LOW (ref 0.61–1.12)

## 2021-06-26 MED ORDER — TORSEMIDE 20 MG PO TABS
40.0000 mg | ORAL_TABLET | Freq: Every day | ORAL | Status: DC
  Administered 2021-06-26: 40 mg via ORAL
  Filled 2021-06-26: qty 2

## 2021-06-26 MED ORDER — MIDODRINE HCL 5 MG PO TABS
10.0000 mg | ORAL_TABLET | ORAL | Status: AC
  Administered 2021-06-26: 10 mg via ORAL
  Filled 2021-06-26: qty 2

## 2021-06-26 MED ORDER — LIDOCAINE 5 % EX PTCH
1.0000 | MEDICATED_PATCH | CUTANEOUS | Status: DC
  Administered 2021-06-26: 1 via TRANSDERMAL
  Filled 2021-06-26: qty 1

## 2021-06-26 MED ORDER — LIDOCAINE 5 % EX PTCH: 1.0000 | MEDICATED_PATCH | Freq: Two times a day (BID) | CUTANEOUS | 0 refills | Status: AC

## 2021-06-26 NOTE — ED Provider Notes (Signed)
[facility] Provider Note    Event Date/Time   First MD Initiated Contact with Patient 06/26/21 1950     (approximate)   History   fluid retention   HPI  Heather Peterson is a 81 y.o. female with a history of CAD hypertension CKD, AICD who comes ED complaining of increased abdominal distention from fluid retention as well as some weeping from bilateral legs.  Currently denies chest pain or shortness of breath.  No fever.  She has been having some diarrhea and decreased oral intake for the last 2 days.  She has not had her midodrine since this morning, has not taken torsemide today.  She is scheduled for an outpatient paracentesis in 2 days.     Physical Exam   Triage Vital Signs: ED Triage Vitals  Enc Vitals Group     BP 06/26/21 1754 (!) 81/48     Pulse Rate 06/26/21 1754 77     Resp 06/26/21 1754 17     Temp 06/26/21 1754 97.6 F (36.4 C)     Temp src --      SpO2 06/26/21 1754 93 %     Weight 06/26/21 1756 174 lb (78.9 kg)     Height 06/26/21 1756 5\' 3"  (1.6 m)     Head Circumference --      Peak Flow --      Pain Score 06/26/21 1756 8     Pain Loc --      Pain Edu? --      Excl. in [place]? --     Most recent vital signs: Vitals:   06/26/21 2200 06/26/21 2230  BP: 94/67 99/67  Pulse: 79 81  Resp: 20 19  Temp:    SpO2: 100% 100%     General: Awake, no distress.  CV:  Good peripheral perfusion.  Regular rate and rhythm Resp:  Normal effort.  Clear to auscultation bilaterally Abd:  Mildly distended.  Not tympanitic.  Ascites present.  Soft and nontender, no peritoneal signs. Other:  1+ pitting edema bilateral lower extremities, symmetric.  No calf tenderness.  No inflammatory changes.  There is serous weeping from a few small skin abrasions  ED Results / Procedures / Treatments   Labs (all labs ordered are listed, but only abnormal results are displayed) Labs Reviewed  COMPREHENSIVE METABOLIC PANEL - Abnormal; Notable for the  following components:      Result Value   Glucose, Bld 117 (*)    BUN 52 (*)    Creatinine, Ser 2.49 (*)    Albumin 3.3 (*)    GFR, Estimated 19 (*)    All other components within normal limits  CBC WITH DIFFERENTIAL/PLATELET - Abnormal; Notable for the following components:   WBC 11.0 (*)    RBC 2.68 (*)    Hemoglobin 7.4 (*)    HCT 25.4 (*)    MCHC 29.1 (*)    RDW 22.5 (*)    nRBC 0.5 (*)    Neutro Abs 8.9 (*)    Monocytes Absolute 1.2 (*)    Abs Immature Granulocytes 0.11 (*)    All other components within normal limits  T4, FREE - Abnormal; Notable for the following components:   Free T4 0.42 (*)    All other components within normal limits  LACTIC ACID, PLASMA  AMMONIA  TSH  MAGNESIUM  LACTIC ACID, PLASMA  URINALYSIS, ROUTINE W REFLEX MICROSCOPIC     EKG  Interpreted by me Sinus rhythm  rate of 78.  Right axis, first-degree AV block.  Prolonged QTc of 544 ms.  No acute ischemic changes.  1 PVC on the strip.   RADIOLOGY Chest x-ray viewed and interpreted by me unremarkable.  Radiology report reviewed.  CT head and cervical spine unremarkable.    PROCEDURES:  Critical Care performed: No  Procedures   MEDICATIONS ORDERED IN ED: Medications  torsemide (DEMADEX) tablet 40 mg (40 mg Oral Given 06/26/21 2017)  lidocaine (LIDODERM) 5 % 1 patch (1 patch Transdermal Patch Applied 06/26/21 2211)  midodrine (PROAMATINE) tablet 10 mg (10 mg Oral Given 06/26/21 2017)     IMPRESSION / MDM / ASSESSMENT AND PLAN / ED COURSE  I reviewed the triage vital signs and the nursing notes.                              Differential diagnosis includes, but is not limited to, hypothyroidism, hepatic encephalopathy, ascites, pulmonary edema, pleural effusion, electrolyte abnormality    Patient presents with increasing abdominal distention consistent with her ascites which is being managed outpatient with paracentesis.  Vital signs are unremarkable.  Blood pressure is  borderline low, but this is chronic for her and she has not had her midodrine recently.  I will give the midodrine as well as torsemide, follow-up ammonia level.  Chest x-ray, EKG, CT head and cervical spine are unremarkable.  Labs show baseline chronic anemia, baseline CKD.  Thyroid function is essentially normal.  ----------------------------------------- 11:07 PM on 06/26/2021 ----------------------------------------- CT head and neck are unremarkable.  Labs including TSH and ammonia are normal.  Stable for discharge to follow-up with outpatient paracentesis in 2 days.      FINAL CLINICAL IMPRESSION(S) / ED DIAGNOSES   Final diagnoses:  Other ascites     Rx / DC Orders   ED Discharge Orders          Ordered    lidocaine (LIDODERM) 5 %  Every 12 hours        06/26/21 2307             Note:  This document was prepared using Dragon voice recognition software and may include unintentional dictation errors.   Carrie Mew, MD 06/26/21 2308

## 2021-06-26 NOTE — Telephone Encounter (Signed)
Pt's daughter, [name], called to state mother has distended abdomen again, and swollen, weeping legs. Oxygen at 88 percent at rest on room air. Currently on supplemental oxygen at 2 L. Daughter reports pt has had at least 4 pounds weight gain over the past 2 days.Pt's physical therapist checked her blood pressure and it was 82/62, increased to 92/70 after midodrine.   Darylene Price, FNP, will be ordering paracentesis. Patient's daughter informed that we will call her back with a time when we are able to speak with scheduling.  Darylene Price also advised that patient needs to follow up with GI. Patient stated she sees [facility] already so does not need referral. Advised pt's daughter to call [facility] and explain her mother's situation and set up an appointment as soon as possible.  Patient's daughter acknowledged and will be waiting on  a call back for paracentesis schedule.  Georg Ruddle, RN

## 2021-06-26 NOTE — ED Triage Notes (Signed)
Pt to ED via [facility] with c/o fluid retention in her abd, she has had to have 2 paracentesis in the last few months. She has had a headache and N/V. Pt wears 2L [place] at home. She is having some SHOB.

## 2021-06-26 NOTE — Progress Notes (Signed)
Order for paracentesis placed due to recurrent abdominal distention. Patient has GI provider at [facility] so she will call to make an appointment with them

## 2021-06-26 NOTE — ED Provider Triage Note (Signed)
Emergency Medicine Provider Triage Evaluation Note  TOMMA EHINGER , a 81 y.o. female  was evaluated in triage.  Patient has a history of prior MI, diabetes, hypertension, combined systolic and diastolic CHF, multiple myeloma here for abdominal distention.  Patient has had 2 prior paracentesis procedures for abdominal distention this year.  Patient states that she has had diarrhea but no vomiting.  Review of Systems  Positive: Patient has abdominal distention. Negative: No chest pain or chest tightness.  Physical Exam  There were no vitals taken for this visit. Gen:   Awake Resp:  Normal effort  MSK:   Moves extremities without difficulty  Other:    Medical Decision Making  Medically screening exam initiated at 5:53 PM.  Appropriate orders placed.  Olivene Cookston Fettes was informed that the remainder of the evaluation will be completed by another provider, this initial triage assessment does not replace that evaluation, and the importance of remaining in the ED until their evaluation is complete.     Vallarie Mare [place], [place] 06/26/21 1756

## 2021-06-26 NOTE — ED Notes (Signed)
Rn's first encounter with pt prior to discharge. Pt verbalized understanding of discharge instructions, prescription medications, and follow-up care instructions. E-signature not available to signature pad not working. Pt advised if symptoms worsen or come back to return to ED.

## 2021-06-26 NOTE — ED Notes (Signed)
First Nurse Note:  Pt to ED via [facility] from home for fluid retention in her abdomen. Pt has appt on Thursday to have fluid taken off but did not feel like she could wait any longer. Pt SpO2 is 99% on her chronic 2 Liters of O2. Pts BP 97/57, pt reports that her blood pressure normally runs lower. Pt is in NAD at this time.

## 2021-06-26 NOTE — Telephone Encounter (Signed)
Scheduled and notified patient of appointment for Paracentesis due to acute abdominal distention and 4lb wt gain in last two days per Imagene Gurney, FNP request.   Luetta Nutting, NT

## 2021-06-27 ENCOUNTER — Emergency Department: Payer: Medicare Other

## 2021-06-27 ENCOUNTER — Inpatient Hospital Stay: Payer: Medicare Other

## 2021-06-27 ENCOUNTER — Other Ambulatory Visit: Payer: Self-pay

## 2021-06-27 ENCOUNTER — Inpatient Hospital Stay
Admission: EM | Admit: 2021-06-27 | Discharge: 2021-07-03 | DRG: 371 | Disposition: A | Discharge status: Skilled Nursing Facility | Payer: Medicare Other | Attending: Hospitalist | Admitting: Hospitalist

## 2021-06-27 DIAGNOSIS — R188 Other ascites: Secondary | ICD-10-CM | POA: Diagnosis present

## 2021-06-27 DIAGNOSIS — I2510 Atherosclerotic heart disease of native coronary artery without angina pectoris: Secondary | ICD-10-CM | POA: Diagnosis present

## 2021-06-27 DIAGNOSIS — I959 Hypotension, unspecified: Secondary | ICD-10-CM | POA: Diagnosis present

## 2021-06-27 DIAGNOSIS — R296 Repeated falls: Secondary | ICD-10-CM | POA: Diagnosis present

## 2021-06-27 DIAGNOSIS — N179 Acute kidney failure, unspecified: Secondary | ICD-10-CM | POA: Diagnosis present

## 2021-06-27 DIAGNOSIS — N184 Chronic kidney disease, stage 4 (severe): Secondary | ICD-10-CM | POA: Diagnosis present

## 2021-06-27 DIAGNOSIS — Z9981 Dependence on supplemental oxygen: Secondary | ICD-10-CM

## 2021-06-27 DIAGNOSIS — Z66 Do not resuscitate: Secondary | ICD-10-CM | POA: Diagnosis present

## 2021-06-27 DIAGNOSIS — K652 Spontaneous bacterial peritonitis: Secondary | ICD-10-CM | POA: Diagnosis present

## 2021-06-27 DIAGNOSIS — J9621 Acute and chronic respiratory failure with hypoxia: Secondary | ICD-10-CM | POA: Diagnosis present

## 2021-06-27 DIAGNOSIS — I9589 Other hypotension: Secondary | ICD-10-CM | POA: Diagnosis present

## 2021-06-27 DIAGNOSIS — F32A Depression, unspecified: Secondary | ICD-10-CM | POA: Diagnosis present

## 2021-06-27 DIAGNOSIS — Z20822 Contact with and (suspected) exposure to covid-19: Secondary | ICD-10-CM | POA: Diagnosis present

## 2021-06-27 DIAGNOSIS — E785 Hyperlipidemia, unspecified: Secondary | ICD-10-CM | POA: Diagnosis present

## 2021-06-27 DIAGNOSIS — K7460 Unspecified cirrhosis of liver: Secondary | ICD-10-CM | POA: Diagnosis present

## 2021-06-27 DIAGNOSIS — Y92009 Unspecified place in unspecified non-institutional (private) residence as the place of occurrence of the external cause: Secondary | ICD-10-CM

## 2021-06-27 DIAGNOSIS — K7682 Hepatic encephalopathy: Secondary | ICD-10-CM | POA: Diagnosis present

## 2021-06-27 DIAGNOSIS — E039 Hypothyroidism, unspecified: Secondary | ICD-10-CM | POA: Diagnosis present

## 2021-06-27 DIAGNOSIS — F17210 Nicotine dependence, cigarettes, uncomplicated: Secondary | ICD-10-CM | POA: Diagnosis present

## 2021-06-27 DIAGNOSIS — E861 Hypovolemia: Secondary | ICD-10-CM | POA: Diagnosis present

## 2021-06-27 DIAGNOSIS — G629 Polyneuropathy, unspecified: Secondary | ICD-10-CM | POA: Diagnosis present

## 2021-06-27 DIAGNOSIS — W1839XA Other fall on same level, initial encounter: Secondary | ICD-10-CM | POA: Diagnosis present

## 2021-06-27 DIAGNOSIS — I130 Hypertensive heart and chronic kidney disease with heart failure and stage 1 through stage 4 chronic kidney disease, or unspecified chronic kidney disease: Secondary | ICD-10-CM | POA: Diagnosis present

## 2021-06-27 DIAGNOSIS — J9811 Atelectasis: Secondary | ICD-10-CM | POA: Diagnosis present

## 2021-06-27 DIAGNOSIS — C9000 Multiple myeloma not having achieved remission: Secondary | ICD-10-CM | POA: Diagnosis present

## 2021-06-27 DIAGNOSIS — R140 Abdominal distension (gaseous): Secondary | ICD-10-CM | POA: Diagnosis not present

## 2021-06-27 DIAGNOSIS — G928 Other toxic encephalopathy: Secondary | ICD-10-CM | POA: Diagnosis present

## 2021-06-27 DIAGNOSIS — K922 Gastrointestinal hemorrhage, unspecified: Secondary | ICD-10-CM

## 2021-06-27 DIAGNOSIS — F17290 Nicotine dependence, other tobacco product, uncomplicated: Secondary | ICD-10-CM | POA: Diagnosis present

## 2021-06-27 DIAGNOSIS — I255 Ischemic cardiomyopathy: Secondary | ICD-10-CM | POA: Diagnosis present

## 2021-06-27 DIAGNOSIS — Z8249 Family history of ischemic heart disease and other diseases of the circulatory system: Secondary | ICD-10-CM

## 2021-06-27 DIAGNOSIS — I5042 Chronic combined systolic (congestive) and diastolic (congestive) heart failure: Secondary | ICD-10-CM | POA: Diagnosis present

## 2021-06-27 DIAGNOSIS — R609 Edema, unspecified: Secondary | ICD-10-CM

## 2021-06-27 DIAGNOSIS — I252 Old myocardial infarction: Secondary | ICD-10-CM

## 2021-06-27 DIAGNOSIS — S82832A Other fracture of upper and lower end of left fibula, initial encounter for closed fracture: Secondary | ICD-10-CM | POA: Diagnosis present

## 2021-06-27 DIAGNOSIS — Z79899 Other long term (current) drug therapy: Secondary | ICD-10-CM

## 2021-06-27 DIAGNOSIS — Z9049 Acquired absence of other specified parts of digestive tract: Secondary | ICD-10-CM

## 2021-06-27 DIAGNOSIS — D649 Anemia, unspecified: Secondary | ICD-10-CM

## 2021-06-27 DIAGNOSIS — K767 Hepatorenal syndrome: Secondary | ICD-10-CM | POA: Diagnosis present

## 2021-06-27 DIAGNOSIS — K219 Gastro-esophageal reflux disease without esophagitis: Secondary | ICD-10-CM | POA: Diagnosis present

## 2021-06-27 DIAGNOSIS — E1122 Type 2 diabetes mellitus with diabetic chronic kidney disease: Secondary | ICD-10-CM | POA: Diagnosis present

## 2021-06-27 DIAGNOSIS — Z95810 Presence of automatic (implantable) cardiac defibrillator: Secondary | ICD-10-CM

## 2021-06-27 DIAGNOSIS — G894 Chronic pain syndrome: Secondary | ICD-10-CM | POA: Diagnosis present

## 2021-06-27 DIAGNOSIS — Z79890 Hormone replacement therapy (postmenopausal): Secondary | ICD-10-CM

## 2021-06-27 DIAGNOSIS — D500 Iron deficiency anemia secondary to blood loss (chronic): Secondary | ICD-10-CM

## 2021-06-27 DIAGNOSIS — K760 Fatty (change of) liver, not elsewhere classified: Secondary | ICD-10-CM | POA: Diagnosis present

## 2021-06-27 DIAGNOSIS — Z7982 Long term (current) use of aspirin: Secondary | ICD-10-CM

## 2021-06-27 LAB — CBC WITH DIFFERENTIAL/PLATELET
Abs Immature Granulocytes: 0.09 10*3/uL — ABNORMAL HIGH (ref 0.00–0.07)
Basophils Absolute: 0 10*3/uL (ref 0.0–0.1)
Basophils Relative: 0 %
Eosinophils Absolute: 0.1 10*3/uL (ref 0.0–0.5)
Eosinophils Relative: 1 %
HCT: 22.9 % — ABNORMAL LOW (ref 36.0–46.0)
Hemoglobin: 6.7 g/dL — ABNORMAL LOW (ref 12.0–15.0)
Immature Granulocytes: 1 %
Lymphocytes Relative: 13 %
Lymphs Abs: 1.1 10*3/uL (ref 0.7–4.0)
MCH: 27.8 pg (ref 26.0–34.0)
MCHC: 29.3 g/dL — ABNORMAL LOW (ref 30.0–36.0)
MCV: 95 fL (ref 80.0–100.0)
Monocytes Absolute: 1.2 10*3/uL — ABNORMAL HIGH (ref 0.1–1.0)
Monocytes Relative: 14 %
Neutro Abs: 6.4 10*3/uL (ref 1.7–7.7)
Neutrophils Relative %: 71 %
Platelets: 211 10*3/uL (ref 150–400)
RBC: 2.41 MIL/uL — ABNORMAL LOW (ref 3.87–5.11)
RDW: 22.8 % — ABNORMAL HIGH (ref 11.5–15.5)
Smear Review: NORMAL
WBC: 8.9 10*3/uL (ref 4.0–10.5)
nRBC: 0.7 % — ABNORMAL HIGH (ref 0.0–0.2)

## 2021-06-27 LAB — PHOSPHORUS: Phosphorus: 6.6 mg/dL — ABNORMAL HIGH (ref 2.5–4.6)

## 2021-06-27 LAB — URINALYSIS, ROUTINE W REFLEX MICROSCOPIC
Bilirubin Urine: NEGATIVE
Glucose, UA: NEGATIVE mg/dL
Hgb urine dipstick: NEGATIVE
Ketones, ur: NEGATIVE mg/dL
Leukocytes,Ua: NEGATIVE
Nitrite: NEGATIVE
Protein, ur: NEGATIVE mg/dL
Specific Gravity, Urine: 1.011 (ref 1.005–1.030)
pH: 5 (ref 5.0–8.0)

## 2021-06-27 LAB — COMPREHENSIVE METABOLIC PANEL
ALT: 8 U/L (ref 0–44)
AST: 17 U/L (ref 15–41)
Albumin: 2.9 g/dL — ABNORMAL LOW (ref 3.5–5.0)
Alkaline Phosphatase: 104 U/L (ref 38–126)
Anion gap: 9 (ref 5–15)
BUN: 50 mg/dL — ABNORMAL HIGH (ref 8–23)
CO2: 25 mmol/L (ref 22–32)
Calcium: 8.5 mg/dL — ABNORMAL LOW (ref 8.9–10.3)
Chloride: 101 mmol/L (ref 98–111)
Creatinine, Ser: 2.53 mg/dL — ABNORMAL HIGH (ref 0.44–1.00)
GFR, Estimated: 19 mL/min — ABNORMAL LOW (ref >60)
Glucose, Bld: 94 mg/dL (ref 70–99)
Potassium: 4.1 mmol/L (ref 3.5–5.1)
Sodium: 135 mmol/L (ref 135–145)
Total Bilirubin: 0.7 mg/dL (ref 0.3–1.2)
Total Protein: 6 g/dL — ABNORMAL LOW (ref 6.5–8.1)

## 2021-06-27 LAB — BODY FLUID CELL COUNT WITH DIFFERENTIAL
Eos, Fluid: 0 %
Lymphs, Fluid: 3 %
Monocyte-Macrophage-Serous Fluid: 32 %
Neutrophil Count, Fluid: 65 %
Total Nucleated Cell Count, Fluid: 1639 cu mm

## 2021-06-27 LAB — GLUCOSE, PLEURAL OR PERITONEAL FLUID: Glucose, Fluid: 80 mg/dL

## 2021-06-27 LAB — PREPARE RBC (CROSSMATCH)

## 2021-06-27 LAB — TROPONIN I (HIGH SENSITIVITY): Troponin I (High Sensitivity): 28 ng/L — ABNORMAL HIGH (ref <18)

## 2021-06-27 LAB — RESP PANEL BY RT-PCR (FLU A&B, COVID) ARPGX2
Influenza A by PCR: NEGATIVE
Influenza B by PCR: NEGATIVE
SARS Coronavirus 2 by RT PCR: NEGATIVE

## 2021-06-27 LAB — MRSA NEXT GEN BY PCR, NASAL: MRSA by PCR Next Gen: NOT DETECTED

## 2021-06-27 LAB — HEMOGLOBIN AND HEMATOCRIT, BLOOD
HCT: 25.1 % — ABNORMAL LOW (ref 36.0–46.0)
HCT: 29.3 % — ABNORMAL LOW (ref 36.0–46.0)
Hemoglobin: 7.9 g/dL — ABNORMAL LOW (ref 12.0–15.0)
Hemoglobin: 9.2 g/dL — ABNORMAL LOW (ref 12.0–15.0)

## 2021-06-27 LAB — PROCALCITONIN: Procalcitonin: 0.32 ng/mL

## 2021-06-27 LAB — PROTIME-INR
INR: 1.5 — ABNORMAL HIGH (ref 0.8–1.2)
Prothrombin Time: 17.6 seconds — ABNORMAL HIGH (ref 11.4–15.2)

## 2021-06-27 LAB — LACTATE DEHYDROGENASE, PLEURAL OR PERITONEAL FLUID: LD, Fluid: 88 U/L — ABNORMAL HIGH (ref 3–23)

## 2021-06-27 LAB — CORTISOL: Cortisol, Plasma: 14.9 ug/dL

## 2021-06-27 LAB — BRAIN NATRIURETIC PEPTIDE: B Natriuretic Peptide: 869.8 pg/mL — ABNORMAL HIGH (ref 0.0–100.0)

## 2021-06-27 LAB — LACTIC ACID, PLASMA
Lactic Acid, Venous: 1.2 mmol/L (ref 0.5–1.9)
Lactic Acid, Venous: 1.2 mmol/L (ref 0.5–1.9)

## 2021-06-27 LAB — GLUCOSE, CAPILLARY: Glucose-Capillary: 87 mg/dL (ref 70–99)

## 2021-06-27 LAB — TSH: TSH: 3.365 u[IU]/mL (ref 0.350–4.500)

## 2021-06-27 LAB — LIPASE, BLOOD: Lipase: 49 U/L (ref 11–51)

## 2021-06-27 LAB — MAGNESIUM: Magnesium: 2.3 mg/dL (ref 1.7–2.4)

## 2021-06-27 MED ORDER — PANTOPRAZOLE 80MG IVPB - SIMPLE MED
80.0000 mg | Freq: Once | INTRAVENOUS | Status: AC
Start: 2021-06-27 — End: 2021-06-27
  Administered 2021-06-27: 80 mg via INTRAVENOUS
  Filled 2021-06-27: qty 100

## 2021-06-27 MED ORDER — LIDOCAINE 5 % EX PTCH
1.0000 | MEDICATED_PATCH | Freq: Every day | CUTANEOUS | Status: DC
  Administered 2021-06-27 – 2021-06-29 (×3): 1 via TRANSDERMAL
  Filled 2021-06-27 (×7): qty 1

## 2021-06-27 MED ORDER — SIMVASTATIN 20 MG PO TABS
20.0000 mg | ORAL_TABLET | Freq: Every day | ORAL | Status: DC
  Administered 2021-06-28 – 2021-07-02 (×5): 20 mg via ORAL
  Filled 2021-06-27 (×5): qty 1

## 2021-06-27 MED ORDER — LEVOTHYROXINE SODIUM 50 MCG PO TABS: 50.0000 ug | ORAL_TABLET | Freq: Every day | ORAL | Status: DC

## 2021-06-27 MED ORDER — LEVOTHYROXINE SODIUM 50 MCG PO TABS
50.0000 ug | ORAL_TABLET | ORAL | Status: DC
  Administered 2021-06-29 – 2021-07-03 (×3): 50 ug via ORAL
  Filled 2021-06-27 (×3): qty 1

## 2021-06-27 MED ORDER — SODIUM CHLORIDE 0.9 % IV SOLN
50.0000 ug/h | INTRAVENOUS | Status: DC
  Administered 2021-06-27 – 2021-07-03 (×12): 50 ug/h via INTRAVENOUS
  Filled 2021-06-27 (×18): qty 1

## 2021-06-27 MED ORDER — OCTREOTIDE LOAD VIA INFUSION
50.0000 ug | Freq: Once | INTRAVENOUS | Status: AC
  Administered 2021-06-27: 50 ug via INTRAVENOUS
  Filled 2021-06-27: qty 25

## 2021-06-27 MED ORDER — LEVOTHYROXINE SODIUM 50 MCG PO TABS
75.0000 ug | ORAL_TABLET | ORAL | Status: DC
  Administered 2021-06-30 – 2021-07-02 (×2): 75 ug via ORAL
  Filled 2021-06-27: qty 2
  Filled 2021-06-27: qty 1

## 2021-06-27 MED ORDER — POLYETHYLENE GLYCOL 3350 17 G PO PACK
17.0000 g | PACK | Freq: Every day | ORAL | Status: DC | PRN
  Administered 2021-06-29: 17 g via ORAL
  Filled 2021-06-27: qty 1

## 2021-06-27 MED ORDER — ACETAMINOPHEN 325 MG PO TABS
650.0000 mg | ORAL_TABLET | ORAL | Status: DC | PRN
  Administered 2021-06-29 – 2021-07-03 (×2): 650 mg via ORAL
  Filled 2021-06-27 (×3): qty 2

## 2021-06-27 MED ORDER — ORAL CARE MOUTH RINSE
15.0000 mL | Freq: Two times a day (BID) | OROMUCOSAL | Status: DC
  Administered 2021-06-27 – 2021-07-02 (×11): 15 mL via OROMUCOSAL

## 2021-06-27 MED ORDER — MIDODRINE HCL 5 MG PO TABS
10.0000 mg | ORAL_TABLET | Freq: Once | ORAL | Status: AC
Start: 2021-06-27 — End: 2021-06-27
  Administered 2021-06-27: 10 mg via ORAL
  Filled 2021-06-27: qty 2

## 2021-06-27 MED ORDER — SODIUM CHLORIDE 0.9 % IV SOLN
2.0000 g | INTRAVENOUS | Status: AC
  Administered 2021-06-27 – 2021-07-03 (×7): 2 g via INTRAVENOUS
  Filled 2021-06-27 (×4): qty 2
  Filled 2021-06-27 (×3): qty 20
  Filled 2021-06-27: qty 2

## 2021-06-27 MED ORDER — FLUOXETINE HCL 10 MG PO CAPS
10.0000 mg | ORAL_CAPSULE | Freq: Every day | ORAL | Status: DC
  Administered 2021-06-29 – 2021-07-03 (×5): 10 mg via ORAL
  Filled 2021-06-27 (×7): qty 1

## 2021-06-27 MED ORDER — DOCUSATE SODIUM 100 MG PO CAPS: 100.0000 mg | ORAL_CAPSULE | Freq: Two times a day (BID) | ORAL | Status: DC

## 2021-06-27 MED ORDER — ALBUMIN HUMAN 25 % IV SOLN
12.5000 g | Freq: Once | INTRAVENOUS | Status: AC
  Administered 2021-06-27: 12.5 g via INTRAVENOUS
  Filled 2021-06-27: qty 50

## 2021-06-27 MED ORDER — SODIUM CHLORIDE 0.9 % IV SOLN: 10.0000 mL/h | Freq: Once | INTRAVENOUS | Status: DC

## 2021-06-27 MED ORDER — NOREPINEPHRINE 4 MG/250ML-% IV SOLN: 0.0000 ug/min | INTRAVENOUS | Status: DC

## 2021-06-27 MED ORDER — DOCUSATE SODIUM 100 MG PO CAPS: 100.0000 mg | ORAL_CAPSULE | Freq: Two times a day (BID) | ORAL | Status: DC | PRN

## 2021-06-27 MED ORDER — LORATADINE 10 MG PO TABS
10.0000 mg | ORAL_TABLET | Freq: Every day | ORAL | Status: DC
Start: 2021-06-27 — End: 2021-07-03
  Administered 2021-06-29 – 2021-07-03 (×5): 10 mg via ORAL
  Filled 2021-06-27 (×5): qty 1

## 2021-06-27 MED ORDER — METRONIDAZOLE 500 MG/100ML IV SOLN
500.0000 mg | Freq: Once | INTRAVENOUS | Status: AC
Start: 2021-06-27 — End: 2021-06-27
  Administered 2021-06-27: 500 mg via INTRAVENOUS
  Filled 2021-06-27: qty 100

## 2021-06-27 MED ORDER — NOREPINEPHRINE 4 MG/250ML-% IV SOLN
2.0000 ug/min | INTRAVENOUS | Status: DC
  Administered 2021-06-27: 2 ug/min via INTRAVENOUS
  Filled 2021-06-27: qty 250

## 2021-06-27 MED ORDER — MORPHINE SULFATE (PF) 2 MG/ML IV SOLN
1.0000 mg | INTRAVENOUS | Status: DC | PRN
  Administered 2021-06-27: 1 mg via INTRAVENOUS
  Administered 2021-06-28 – 2021-06-29 (×3): 2 mg via INTRAVENOUS
  Administered 2021-06-29: 1 mg via INTRAVENOUS
  Administered 2021-06-30: 2 mg via INTRAVENOUS
  Filled 2021-06-27 (×6): qty 1

## 2021-06-27 MED ORDER — CHLORHEXIDINE GLUCONATE CLOTH 2 % EX PADS
6.0000 | MEDICATED_PAD | Freq: Every day | CUTANEOUS | Status: DC
  Administered 2021-06-27 – 2021-07-03 (×6): 6 via TOPICAL

## 2021-06-27 MED ORDER — MIDODRINE HCL 5 MG PO TABS
10.0000 mg | ORAL_TABLET | Freq: Three times a day (TID) | ORAL | Status: DC
  Administered 2021-06-28 – 2021-07-03 (×17): 10 mg via ORAL
  Filled 2021-06-27 (×18): qty 2

## 2021-06-27 MED ORDER — LEVOTHYROXINE SODIUM 50 MCG PO TABS: 75.0000 ug | ORAL_TABLET | ORAL | Status: DC

## 2021-06-27 MED ORDER — SODIUM CHLORIDE 0.9 % IV BOLUS
250.0000 mL | Freq: Once | INTRAVENOUS | Status: AC
  Administered 2021-06-27: 250 mL via INTRAVENOUS

## 2021-06-27 MED ORDER — SODIUM CHLORIDE 0.9 % IV SOLN
250.0000 mL | INTRAVENOUS | Status: DC
Start: 2021-06-27 — End: 2021-07-03
  Administered 2021-06-27: 250 mL via INTRAVENOUS

## 2021-06-27 MED ORDER — VANCOMYCIN HCL 2000 MG/400ML IV SOLN
2000.0000 mg | Freq: Once | INTRAVENOUS | Status: AC
  Administered 2021-06-27: 2000 mg via INTRAVENOUS
  Filled 2021-06-27 (×2): qty 400

## 2021-06-27 MED ORDER — SODIUM CHLORIDE 0.9% IV SOLUTION: Freq: Once | INTRAVENOUS | Status: AC

## 2021-06-27 NOTE — ED Notes (Signed)
NP Nelson at bedside evaluating this pt. Aforementioned provider instructed this RN to hold on Levo for the time being in order to complete abx and blood administration with 2 IV access points in place. Holding levo at this time.

## 2021-06-27 NOTE — ED Notes (Signed)
MD Isaacs notified of BP

## 2021-06-27 NOTE — ED Notes (Signed)
MD Isaacs at bedside collecting fecal occult sample

## 2021-06-27 NOTE — Consult Note (Signed)
PHARMACY -  BRIEF ANTIBIOTIC NOTE   Pharmacy has received consult(s) for sepsis from an ED provider.  The patient's profile has been reviewed for ht/wt/allergies/indication/available labs.    One time order(s) placed for vancomycin  Further antibiotics/pharmacy consults should be ordered by admitting physician if indicated.                       Thank you, Oswald Hillock 06/27/2021  9:43 AM

## 2021-06-27 NOTE — ED Triage Notes (Signed)
Report per EMS, Coming from home. Here earlier to today for abd distention. Multiple falls today. Skin tears noted below right knee and on left knee. Per EMS blood pressure 84/ 48  on arrival. 500 ml bolus given. Pt currently on 3 liters nasal cannula. Wears o2 at home as needed.

## 2021-06-27 NOTE — ED Notes (Signed)
MD Isaacs at bedside observing pt when BP was ran, MD Isaacs aware of low BP

## 2021-06-27 NOTE — ED Provider Notes (Signed)
[facility] Provider Note    Event Date/Time   First MD Initiated Contact with Patient 06/27/21 [phone]     (approximate)   History   Fall   HPI  Heather Peterson is a 81 y.o. female with past medical history of CAD, CKD, AICD, cirrhosis, requiring recurrent paracentesis, multiple myeloma, here with fall.  The patient was just seen in the ED yesterday for fall.  She reportedly had had some decreased p.o. intake and diarrhea at that time and was given a small amount of fluid with improvement.  She states that when she returned home, she has since fallen twice.  She states her legs just "gave out."  Denies necessarily feeling weak, but she is also somewhat confused as to the timeline of when she was here and when she went home.  She currently complains of left calf pain after the fall, and mild headache.  Denies any abdominal pain other than a tightness sensation related to her ascites, which seems to be an ongoing issue.  Denies any known fevers or chills.  She does have a cough and reports some mild shortness of breath when lying flat, though this is also not necessarily new.  No recent medication changes.  No urinary symptoms.  No other complaints.     Physical Exam   Triage Vital Signs: ED Triage Vitals  Enc Vitals Group     BP 06/27/21 0630 104/85     Pulse Rate 06/27/21 0630 84     Resp 06/27/21 0630 16     Temp --      Temp src --      SpO2 06/27/21 0630 92 %     Weight --      Height --      Head Circumference --      Peak Flow --      Pain Score 06/27/21 0631 4     Pain Loc --      Pain Edu? --      Excl. in [place]? --     Most recent vital signs: Vitals:   06/27/21 1015 06/27/21 1030  BP: 96/63 (!) 79/59  Pulse: 74 75  Resp: 12 14  Temp:  (!) 94.8 F (34.9 C)  SpO2: 96% 94%     General: Awake, no distress.  Chronically ill-appearing. CV:  Good peripheral perfusion.  Resp:  Normal effort.  Crackles noted bilateral lung bases, normal  work of breathing.  Speaking in full sentences. Abd:  Moderate distention noted with fluid wave.  No overt tenderness or peritonitis. Other:  1+ pitting edema bilateral lower extremities.  Scattered bruises of various stages.  Superficial skin tears noted to the right and left knee area, as well as left arm.  No deep lacerations or wounds.   ED Results / Procedures / Treatments   Labs (all labs ordered are listed, but only abnormal results are displayed) Labs Reviewed  CBC WITH DIFFERENTIAL/PLATELET - Abnormal; Notable for the following components:      Result Value   RBC 2.41 (*)    Hemoglobin 6.7 (*)    HCT 22.9 (*)    MCHC 29.3 (*)    RDW 22.8 (*)    nRBC 0.7 (*)    Monocytes Absolute 1.2 (*)    Abs Immature Granulocytes 0.09 (*)    All other components within normal limits  COMPREHENSIVE METABOLIC PANEL - Abnormal; Notable for the following components:   BUN 50 (*)  Creatinine, Ser 2.53 (*)    Calcium 8.5 (*)    Total Protein 6.0 (*)    Albumin 2.9 (*)    GFR, Estimated 19 (*)    All other components within normal limits  BRAIN NATRIURETIC PEPTIDE - Abnormal; Notable for the following components:   B Natriuretic Peptide 869.8 (*)    All other components within normal limits  URINALYSIS, ROUTINE W REFLEX MICROSCOPIC - Abnormal; Notable for the following components:   Color, Urine YELLOW (*)    APPearance CLEAR (*)    All other components within normal limits  TROPONIN I (HIGH SENSITIVITY) - Abnormal; Notable for the following components:   Troponin I (High Sensitivity) 28 (*)    All other components within normal limits  RESP PANEL BY RT-PCR (FLU A&B, COVID) ARPGX2  CULTURE, BLOOD (SINGLE)  CULTURE, BLOOD (SINGLE)  LACTIC ACID, PLASMA  LIPASE, BLOOD  PROCALCITONIN  TSH  LACTIC ACID, PLASMA  CORTISOL  OCCULT BLOOD X 1 CARD TO LAB, STOOL  TYPE AND SCREEN  PREPARE RBC (CROSSMATCH)     EKG Normal sinus rhythm, ventricular rate 76.  PR 214, QRS 135, QTc 557.  No  acute ST elevations or depressions.  No EKG evidence of acute ischemia or infarct.   RADIOLOGY CT head: No acute intracranial abnormality CT cervical spine: Possible mild edema noted in the lung fields, no acute fracture of the spine Chest x-ray: Cardiomegaly, mild pulmonary edema X-ray tib-fib left: Acute fracture of the proximal metadiaphysis of the fibula   I also independently reviewed and agree wit radiologist interpretations.   PROCEDURES:  Critical Care performed: No  .1-3 Lead EKG Interpretation Performed by: Duffy Bruce, MD Authorized by: Duffy Bruce, MD     Interpretation: normal     ECG rate:  70-90 Comments:     Indication: Hypotension, weakness .Critical Care Performed by: Duffy Bruce, MD Authorized by: Duffy Bruce, MD   Critical care provider statement:    Critical care time (minutes):  30   Critical care was necessary to treat or prevent imminent or life-threatening deterioration of the following conditions:  Dehydration, shock, cardiac failure, circulatory failure and respiratory failure   Critical care was time spent personally by me on the following activities:  Development of treatment plan with patient or surrogate, discussions with consultants, evaluation of patient's response to treatment, examination of patient, ordering and review of laboratory studies, ordering and review of radiographic studies, ordering and performing treatments and interventions, pulse oximetry, re-evaluation of patient's condition and review of old charts   I assumed direction of critical care for this patient from another provider in my specialty: no      MEDICATIONS ORDERED IN ED: Medications  0.9 %  sodium chloride infusion (0 mL/hr Intravenous Hold 06/27/21 0819)  cefTRIAXone (ROCEPHIN) 2 g in sodium chloride 0.9 % 100 mL IVPB (0 g Intravenous Stopped 06/27/21 1035)  metroNIDAZOLE (FLAGYL) IVPB 500 mg (500 mg Intravenous New Bag/Given 06/27/21 1003)  vancomycin  (VANCOREADY) IVPB 2000 mg/400 mL (has no administration in time range)  0.9 %  sodium chloride infusion (has no administration in time range)  norepinephrine (LEVOPHED) 23m in 2518m(0.016 mg/mL) premix infusion (0 mcg/min Intravenous Hold 06/27/21 1044)  octreotide (SANDOSTATIN) 2 mcg/mL load via infusion 50 mcg (has no administration in time range)    And  octreotide (SANDOSTATIN) 500 mcg in sodium chloride 0.9 % 250 mL (2 mcg/mL) infusion (has no administration in time range)  norepinephrine (LEVOPHED) 27m62mn 250m22m.016 mg/mL) premix  infusion (has no administration in time range)  docusate sodium (COLACE) capsule 100 mg (has no administration in time range)  polyethylene glycol (MIRALAX / GLYCOLAX) packet 17 g (has no administration in time range)  acetaminophen (TYLENOL) tablet 650 mg (has no administration in time range)  midodrine (PROAMATINE) tablet 10 mg (10 mg Oral Given 06/27/21 0809)  pantoprazole (PROTONIX) 80 mg /NS 100 mL IVPB (0 mg Intravenous Stopped 06/27/21 0939)  sodium chloride 0.9 % bolus 250 mL (0 mLs Intravenous Stopped 06/27/21 1039)     IMPRESSION / MDM / ASSESSMENT AND PLAN / ED COURSE  I reviewed the triage vital signs and the nursing notes.                               The patient is on the cardiac monitor to evaluate for evidence of arrhythmia and/or significant heart rate changes.   Ddx:  Generalized weakness secondary to hepatorenal syndrome with dehydration and AKI, symptomatic anemia, occult sepsis, less likely hepatic encephalopathy given recent negative ammonia   MDM:  81 year old female with extensive past medical history including CHF, multiple myeloma, cirrhosis, here with recurrent falls and generalized weakness, just seen yesterday for abdominal distention.  Clinically, the patient is drowsy but able to answer questions appropriately.  She is hypotensive and mildly hypothermic.  She has chronic hypotension, for which I have given midodrine, but  concern for possible underlying anemia versus sepsis versus hepatorenal syndrome based on her presentation.  CBC shows hemoglobin of 6.7, below transfusion threshold and down from over 7 just 12 hours ago.  Hemoccult positive.  No gross blood per rectum.  CMP shows increasing creatinine concerning for possible hepatorenal syndrome.  Lactic acid is 1.2.  Clinically, she actually appears edematous and hypervolemic, with elevated troponin and BNP with edema on chest x-ray, raising concern that additional fluid resuscitation would worsen hepatorenal syndrome and edema.  Will plan to give packed red blood cells for volume, admit to the ICU for ongoing management.  Regarding her fall, plain films do show proximal left fibula fracture.  This appears very close to the knee.  Dr. Roland Rack of orthopedics consulted, will place in knee immobilizer for now.  Appreciate his recommendations.  I also discussed the case with Dr. Vicente Males of GI given her complex history, he recommends starting empiric antibiotics as well as octreotide and Protonix.  Patient at fairly high risk of decompensation given her complex comorbidities, hypertension, anemia, AKI  MEDICATIONS GIVEN IN ED: Medications  0.9 %  sodium chloride infusion (0 mL/hr Intravenous Hold 06/27/21 0819)  cefTRIAXone (ROCEPHIN) 2 g in sodium chloride 0.9 % 100 mL IVPB (0 g Intravenous Stopped 06/27/21 1035)  metroNIDAZOLE (FLAGYL) IVPB 500 mg (500 mg Intravenous New Bag/Given 06/27/21 1003)  vancomycin (VANCOREADY) IVPB 2000 mg/400 mL (has no administration in time range)  0.9 %  sodium chloride infusion (has no administration in time range)  norepinephrine (LEVOPHED) 65m in 2571m(0.016 mg/mL) premix infusion (0 mcg/min Intravenous Hold 06/27/21 1044)  octreotide (SANDOSTATIN) 2 mcg/mL load via infusion 50 mcg (has no administration in time range)    And  octreotide (SANDOSTATIN) 500 mcg in sodium chloride 0.9 % 250 mL (2 mcg/mL) infusion (has no administration in time  range)  norepinephrine (LEVOPHED) 11m32mn 250m95m.016 mg/mL) premix infusion (has no administration in time range)  docusate sodium (COLACE) capsule 100 mg (has no administration in time range)  polyethylene glycol (MIRALAX / GLYCOLAX)  packet 17 g (has no administration in time range)  acetaminophen (TYLENOL) tablet 650 mg (has no administration in time range)  midodrine (PROAMATINE) tablet 10 mg (10 mg Oral Given 06/27/21 0809)  pantoprazole (PROTONIX) 80 mg /NS 100 mL IVPB (0 mg Intravenous Stopped 06/27/21 0939)  sodium chloride 0.9 % bolus 250 mL (0 mLs Intravenous Stopped 06/27/21 1039)     Consults:  GI Dr. Vicente Males - case discussed via telephone and epic Ortho Dr. Roland Rack - case discussed via telephone Intensivist Dr. Mortimer Fries, who will admit   EMR reviewed  ED note from yesterday Multiple myeloma visit note from Dr. Grayland Ormond on 2/8, marking the patient is no longer on chemotherapy currently CHF clinic note from [facility] on 2/7     FINAL CLINICAL IMPRESSION(S) / ED DIAGNOSES   Final diagnoses:  Blood loss anemia  Hepatorenal syndrome (HCC)  Gastrointestinal hemorrhage, unspecified gastrointestinal hemorrhage type  Hypotension, unspecified hypotension type     Rx / DC Orders   ED Discharge Orders     None        Note:  This document was prepared using Dragon voice recognition software and may include unintentional dictation errors.   Duffy Bruce, MD 06/27/21 1052

## 2021-06-27 NOTE — ED Notes (Signed)
Lab called at this time to draw lactic and cultures

## 2021-06-27 NOTE — ED Notes (Signed)
Bear hugger applied at this time.

## 2021-06-27 NOTE — ED Notes (Signed)
Pt taken for scans at this time

## 2021-06-27 NOTE — Consult Note (Signed)
Heather Peterson , MD [address] [place], [place], [place], [address] [address], [place], [place], [place], [address] Phone: [phone]  Fax: [phone]  Consultation  Referring Provider:   ER Primary Care Physician:  [name], MD Primary Gastroenterologist:  [facility]          Reason for Consultation:     Anemia   Date of Admission:  06/27/2021 Date of Consultation:  06/27/2021         HPI:   Heather Peterson is a 81 y.o. female who has a history of liver cirrhosis and follows with Dr. Alice Reichert at [facility] gastroenterology.  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  Also has multiple myeloma and follows with hematology.  Last seen at the GI office back in April 2022.  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  Similarly in April 2021 had GI bleeding and I found AVMs in the stomach treated with APC.  Colonoscopy in April 2021 showed adenomatous polyps..  She had a history of CHF ischemic cardiomyopathy COPD on oxygen.  She came in with multiple falls [leading to fracture of her lower limb] to the emergency room was hypotensive in the ER.  She is complaining of increased abdominal distention from fluid retention.  On admission hemoglobin was 7.4 g slightly lower than baseline which is around 8.7 g MCV of 94.  CMP was showing elevated creatinine of 2.49 with a BUN of 52.  She is scheduled for an ultrasound paracentesis.  When I went to see her in the ICU she was hypotensive and was being warmed externally and alert and denied any overt blood loss in terms of hematemesis or melena as per her knowledge.  Denies any abdominal pain but complained of abdominal distention which is new not recently seen by gastroenterologist.  She has an elevated BNP.  Past Medical History:  Diagnosis Date   Anxiety    Chronic combined systolic and diastolic CHF (congestive heart failure)  (HCC)    Chronic kidney disease    Coronary artery disease    Depression    Diabetes mellitus without complication (HCC)    Diabetes mellitus, type II ([place])    Hypertension    MI (myocardial infarction) ([place])    x 5   Pacemaker    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HCC)    Prolonged Q-T interval on ECG    Thyroid disease     Past Surgical History:  Procedure Laterality Date   CENTRAL LINE INSERTION  03/11/2017   Procedure: CENTRAL LINE INSERTION;  Surgeon: Leonie Man, MD;  Location: [place] [facility];  Service: Cardiovascular;;   CHOLECYSTECTOMY     COLONOSCOPY WITH PROPOFOL N/A 09/01/2019   Procedure: COLONOSCOPY WITH PROPOFOL;  Surgeon: Toledo, Benay Pike, MD;  Location: [facility] ENDOSCOPY;  Service: Gastroenterology;  Laterality: N/A;   CORONARY STENT INTERVENTION W/IMPELLA N/A 03/11/2017   Procedure: Coronary Stent Intervention w/Impella;  Surgeon: Leonie Man, MD;  Location: [place] [facility];  Service: Cardiovascular;  Laterality: N/A;   ESOPHAGOGASTRODUODENOSCOPY (EGD) WITH PROPOFOL N/A 09/01/2019   Procedure: ESOPHAGOGASTRODUODENOSCOPY (EGD) WITH PROPOFOL;  Surgeon: Toledo, Benay Pike, MD;  Location: [facility] ENDOSCOPY;  Service: Gastroenterology;  Laterality: N/A;   ESOPHAGOGASTRODUODENOSCOPY (EGD) WITH PROPOFOL N/A 09/08/2019   Procedure: ESOPHAGOGASTRODUODENOSCOPY (EGD) WITH PROPOFOL;  Surgeon: Heather Bellows, MD;  Location: [facility] ENDOSCOPY;  Service: Gastroenterology;  Laterality: N/A;  ESOPHAGOGASTRODUODENOSCOPY (EGD) WITH PROPOFOL N/A 02/15/2021   Procedure: ESOPHAGOGASTRODUODENOSCOPY (EGD) WITH PROPOFOL;  Surgeon: Heather Bellows, MD;  Location: [facility] ENDOSCOPY;  Service: Gastroenterology;  Laterality: N/A;   EYE SURGERY     HIP ARTHROPLASTY Right 03/29/2020   Procedure: ARTHROPLASTY BIPOLAR HIP (HEMIARTHROPLASTY);  Surgeon: Corky Mull, MD;  Location: [facility] ORS;  Service: Orthopedics;  Laterality: Right;   INTRAVASCULAR PRESSURE WIRE/FFR STUDY N/A 03/11/2017   Procedure:  INTRAVASCULAR PRESSURE WIRE/FFR STUDY;  Surgeon: Leonie Man, MD;  Location: [place] [facility];  Service: Cardiovascular;  Laterality: N/A;   INTRAVASCULAR ULTRASOUND/IVUS N/A 03/11/2017   Procedure: Intravascular Ultrasound/IVUS;  Surgeon: Leonie Man, MD;  Location: [place] [facility];  Service: Cardiovascular;  Laterality: N/A;   LEFT HEART CATH AND CORONARY ANGIOGRAPHY N/A 03/05/2017   Procedure: LEFT HEART CATH AND CORONARY ANGIOGRAPHY;  Surgeon: Isaias Cowman, MD;  Location: [place] [facility];  Service: Cardiovascular;  Laterality: N/A;   PACEMAKER IMPLANT     pacemaker/defibrillator Left     Prior to Admission medications   Medication Sig Start Date End Date Taking? Authorizing Provider  allopurinol (ZYLOPRIM) 300 MG tablet Take 300 mg by mouth daily. 04/23/21   [provider]  aspirin EC 81 MG tablet Take 81 mg by mouth at bedtime.     [provider]  colchicine 0.6 MG tablet Take by mouth.    [provider]  dexamethasone (DECADRON) 4 MG tablet Take 5 tablets (20 mg total) by mouth daily. Take the day after Velcade on days 2,5,9,12. Take with breakfast Patient not taking: Reported on 06/26/2021 11/14/20   Lloyd Huger, MD  docusate sodium (COLACE) 100 MG capsule Take 100 mg by mouth 2 (two) times daily.    [provider]  Ensure Max Protein (ENSURE MAX PROTEIN) LIQD Take 330 mLs (11 oz total) by mouth 2 (two) times daily between meals. 04/03/20   [name], MD  fexofenadine (ALLEGRA) 180 MG tablet Take 180 mg by mouth daily.    [provider]  FLUoxetine (PROZAC) 10 MG capsule Take 10 mg by mouth daily. 06/29/19   [provider]  gabapentin (NEURONTIN) 100 MG capsule TAKE 1 CAPSULE(100 MG) BY MOUTH THREE TIMES DAILY Patient not taking: Reported on 05/09/2021 12/29/20   Lloyd Huger, MD  gabapentin (NEURONTIN) 600 MG tablet Take 600 mg by mouth daily. Patient takes 100 mg QAM, 700 mg QHS  11/07/20   [provider]  hydrocortisone (ANUSOL-HC) 2.5 % rectal cream Apply 1 application topically 2 (two) times daily. Patient not taking: Reported on 05/23/2021 02/12/20   [provider]  Lactulose 20 GM/30ML SOLN Take 30 mLs (20 g total) by mouth in the morning and at bedtime. Patient not taking: Reported on 05/09/2021 06/23/20   Jacquelin Hawking, NP  levothyroxine (SYNTHROID) 50 MCG tablet Take 50 mcg by mouth daily before breakfast. 08/14/20   [provider]  levothyroxine (SYNTHROID) 75 MCG tablet Take 1 tablet (75 mcg total) by mouth daily before breakfast. On Monday, Wednesday and Friday 04/03/20   [name], MD  lidocaine (LIDODERM) 5 % Place 1 patch onto the skin every 12 (twelve) hours. Remove & Discard patch within 12 hours or as directed by MD 06/26/21   Carrie Mew, MD  mexiletine (MEXITIL) 200 MG capsule Take 1 capsule (200 mg total) by mouth every 12 (twelve) hours. 03/14/17   Chanetta Marshall K, NP  mexiletine (MEXITIL) 200 MG capsule Take by mouth. Patient not taking:  Reported on 06/26/2021 05/09/21   [provider]  midodrine (PROAMATINE) 10 MG tablet Take 10 mg by mouth 3 (three) times daily. 06/13/21   [provider]  OXYGEN Inhale 2 L into the lungs continuous.    [provider]  pantoprazole (PROTONIX) 40 MG tablet Take 40 mg by mouth daily.  05/16/19   [provider]  polyethylene glycol (MIRALAX) 17 g packet Take 17 g by mouth daily. Patient not taking: Reported on 05/09/2021 06/18/20   Nance Pear, MD  Probiotic Product (PROBIOTIC DAILY PO) Take 1 tablet by mouth daily. Patient not taking: Reported on 06/26/2021    [provider]  SANTYL ointment Apply topically. Patient not taking: Reported on 05/09/2021 01/01/21   [provider]  simvastatin (ZOCOR) 20 MG tablet Take 20 mg by mouth at bedtime. 05/15/21   [provider]  spironolactone (ALDACTONE) 25 MG tablet Take 0.5  tablets (12.5 mg total) by mouth every other day. Patient not taking: Reported on 06/20/2021 01/03/21   Alisa Graff, FNP  torsemide (DEMADEX) 20 MG tablet Take 40 mg by mouth as needed.    [provider]  traZODone (DESYREL) 50 MG tablet Take 50 mg by mouth at bedtime.    [provider]    Family History  Problem Relation Age of Onset   Hypertension Father    Heart attack Father    Depression Sister    Depression Brother    Depression Brother      Social History   Tobacco Use   Smoking status: Every Day    Packs/day: 0.25    Types: E-cigarettes, Cigarettes   Smokeless tobacco: Never  Vaping Use   Vaping Use: Former  Substance Use Topics   Alcohol use: Not Currently    Comment: occasionally   Drug use: Yes    Comment: prescribed pain meds    Allergies as of 06/27/2021 - Review Complete 06/27/2021  Allergen Reaction Noted   Celebrex [celecoxib] Anaphylaxis 02/04/2017   Glipizide Anaphylaxis 08/03/2013   Levaquin [levofloxacin in d5w] Other (See Comments) 02/04/2017   Levofloxacin Other (See Comments) and Palpitations 02/10/2017   Lisinopril Swelling 05/19/2019   Sulfa antibiotics Other (See Comments) and Anaphylaxis 08/03/2013   Metformin Other (See Comments) 08/03/2013   Penicillins Rash and Other (See Comments) 08/03/2013    Review of Systems:    All systems reviewed and negative except where noted in HPI.   Physical Exam:  Vital signs in last 24 hours: Temp:  [97.6 F (36.4 C)] 97.6 F (36.4 C) (02/14 2300) Pulse Rate:  [72-84] 72 (02/15 0903) Resp:  [14-20] 17 (02/15 0903) BP: (77-104)/(48-85) 79/51 (02/15 0903) SpO2:  [90 %-100 %] 97 % (02/15 0903) Weight:  [78.9 kg] 78.9 kg (02/14 1756)   General: Appears comfortable in the ICU covered with wall mass. Head:  Normocephalic and atraumatic. Eyes:   No icterus.   Conjunctiva pink. PERRLA. Ears:  Normal auditory acuity. Neck:  Supple; no masses or thyroidomegaly Lungs: Decreased air  entry bilaterally Heart:  Regular rate and rhythm;  Without murmur, clicks, rubs or gallops Abdomen:  Soft, distended nontender. Normal bowel sounds. No appreciable masses or hepatomegaly.  No rebound or guarding.  Neurologic:  Alert and oriented x3;  grossly normal neurologically. Psych:  Alert and cooperative. Normal affect.  LAB RESULTS: Recent Labs    06/26/21 1813 06/27/21 0634  WBC 11.0* 8.9  HGB 7.4* 6.7*  HCT 25.4* 22.9*  PLT 231 211   BMET  Recent Labs    06/26/21 1813 06/27/21 0634  NA 135 135  K 4.5 4.1  CL 100 101  CO2 24 25  GLUCOSE 117* 94  BUN 52* 50*  CREATININE 2.49* 2.53*  CALCIUM 9.2 8.5*   LFT Recent Labs    06/27/21 0634  PROT 6.0*  ALBUMIN 2.9*  AST 17  ALT 8  ALKPHOS 104  BILITOT 0.7   PT/INR No results for input(s): LABPROT, INR in the last 72 hours.  STUDIES: DG Chest 2 View  Result Date: 06/26/2021 CLINICAL DATA:  Shortness of breath EXAM: CHEST - 2 VIEW COMPARISON:  Chest radiograph dated February 12, 2021 FINDINGS: The heart is markedly enlarged. 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 No focal consolidation or pleural effusion. No acute osseous abnormality. IMPRESSION: Cardiomegaly without evidence of focal consolidation or pleural effusion. Electronically Signed   By: Keane Police D.O.   On: 06/26/2021 18:39   DG Tibia/Fibula Left  Result Date: 06/27/2021 CLINICAL DATA:  Shortness of breath.  Status post fall. EXAM: LEFT TIBIA AND FIBULA - 2 VIEW COMPARISON:  None. FINDINGS: There is a fracture deformity involving the proximal metadiaphysis of the fibula. The fracture fragments appear to be in near anatomic alignment. No additional fracture or dislocation identified. IMPRESSION: Acute fracture of the proximal metadiaphysis of the fibula. Electronically Signed   By: Kerby Moors M.D.   On: 06/27/2021 07:56   DG Ankle Complete Left  Result Date: 06/27/2021 CLINICAL DATA:  Multiple falls, ankle  injury, initial encounter. EXAM: LEFT ANKLE COMPLETE - 3+ VIEW COMPARISON:  Left tibia fibula 06/27/2021. FINDINGS: Ankle mortise is intact.  No fracture. IMPRESSION: No acute findings. Electronically Signed   By: Lorin Picket M.D.   On: 06/27/2021 08:50   CT HEAD WO CONTRAST (5MM)  Result Date: 06/27/2021 CLINICAL DATA:  Head trauma, moderate-severe; Neck trauma (Age >= 65y) EXAM: CT HEAD WITHOUT CONTRAST CT CERVICAL SPINE WITHOUT CONTRAST TECHNIQUE: Multidetector CT imaging of the head and cervical spine was performed following the standard protocol without intravenous contrast. Multiplanar CT image reconstructions of the cervical spine were also generated. RADIATION DOSE REDUCTION: This exam was performed according to the departmental dose-optimization program which includes automated exposure control, adjustment of the mA and/or kV according to patient size and/or use of iterative reconstruction technique. COMPARISON:  February 14, 23. FINDINGS: CT HEAD FINDINGS Brain: No evidence of acute infarction, hemorrhage, hydrocephalus, extra-axial collection or mass lesion/mass effect. Similar chronic right frontal encephalomalacia. Similar additional focal encephalomalacia in the right perirolandic frontoparietal cortex. Similar additional patchy white matter hypoattenuation, nonspecific but compatible with chronic microvascular ischemic disease. Partially empty sella. Vascular: Calcific intracranial atherosclerosis. No hyperdense vessel identified. Skull: No evidence of acute fracture. Sinuses/Orbits: Opacified anterior left ethmoid air cells. Otherwise, visualized sinuses are clear. Unremarkable orbits. Other: No mastoid effusions.  Cerumen in bilateral EACs. CT CERVICAL SPINE FINDINGS Alignment: Similar mild anterolisthesis of C3 on C4, C4 on C5, and C5 on C6. No new sagittal subluxation. Skull base and vertebrae: Unchanged vertebral body heights. No evidence of acute fracture. Soft tissues and spinal canal:  No prevertebral fluid or swelling. No visible canal hematoma. Disc levels:  Similar multilevel degenerative change, mild for age. Upper chest: Interlobular septal thickening in the right greater than left lung apices with possible small layering right pleural effusion and emphysema. IMPRESSION: CT head: 1. No evidence of acute intracranial abnormality. 2. 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 CT cervical spine: 1. No  evidence of acute fracture or traumatic malalignment. 2. Partially imaged interlobular septal thickening in the lung apices, compatible with mild interstitial edema. Possible small layering right pleural effusion. Emphysema (ICD10-J43.9). Electronically Signed   By: Margaretha Sheffield M.D.   On: 06/27/2021 08:10   CT Head Wo Contrast  Result Date: 06/26/2021 CLINICAL DATA:  Mental status change, unknown cause; Neck pain, acute, no red flags EXAM: CT HEAD WITHOUT CONTRAST CT CERVICAL SPINE WITHOUT CONTRAST TECHNIQUE: Multidetector CT imaging of the head and cervical spine was performed following the standard protocol without intravenous contrast. Multiplanar CT image reconstructions of the cervical spine were also generated. RADIATION DOSE REDUCTION: This exam was performed according to the departmental dose-optimization program which includes automated exposure control, adjustment of the mA and/or kV according to patient size and/or use of iterative reconstruction technique. COMPARISON:  CT cervical spine 03/28/2020 FINDINGS: CT HEAD FINDINGS BRAIN: BRAIN Patchy and confluent areas of decreased attenuation are noted throughout the deep and periventricular white matter of the cerebral hemispheres bilaterally, compatible with chronic microvascular ischemic disease. Right frontal lobe encephalomalacia. No evidence of large-territorial acute infarction. No parenchymal hemorrhage. No mass lesion. No extra-axial collection. No mass effect or midline shift. No hydrocephalus.  Basilar cisterns are patent. Vascular: No hyperdense vessel. Skull: No acute fracture or focal lesion. Sinuses/Orbits: Paranasal sinuses and mastoid air cells are clear. Bilateral lens replacement. Otherwise the orbits are unremarkable. Other: None. CT CERVICAL SPINE FINDINGS Alignment: Stable grade 1 anterolisthesis of C3 on C4, C4 on C5, C5 on C6. Skull base and vertebrae: Mild multilevel degenerative changes of the spine. No associated severe osseous central canal or neural foraminal stenosis. No acute fracture. No aggressive appearing focal osseous lesion or focal pathologic process. Soft tissues and spinal canal: No prevertebral fluid or swelling. No visible canal hematoma. Upper chest: Unremarkable. Other: Atherosclerotic plaque of the carotid arteries within the neck. IMPRESSION: 1. No acute intracranial abnormality. 2. No acute displaced fracture or traumatic listhesis of the cervical spine. Electronically Signed   By: Iven Finn M.D.   On: 06/26/2021 20:41   CT Cervical Spine Wo Contrast  Result Date: 06/27/2021 CLINICAL DATA:  Head trauma, moderate-severe; Neck trauma (Age >= 65y) EXAM: CT HEAD WITHOUT CONTRAST CT CERVICAL SPINE WITHOUT CONTRAST TECHNIQUE: Multidetector CT imaging of the head and cervical spine was performed following the standard protocol without intravenous contrast. Multiplanar CT image reconstructions of the cervical spine were also generated. RADIATION DOSE REDUCTION: This exam was performed according to the departmental dose-optimization program which includes automated exposure control, adjustment of the mA and/or kV according to patient size and/or use of iterative reconstruction technique. COMPARISON:  February 14, 23. FINDINGS: CT HEAD FINDINGS Brain: No evidence of acute infarction, hemorrhage, hydrocephalus, extra-axial collection or mass lesion/mass effect. Similar chronic right frontal encephalomalacia. Similar additional focal encephalomalacia in the right  perirolandic frontoparietal cortex. Similar additional patchy white matter hypoattenuation, nonspecific but compatible with chronic microvascular ischemic disease. Partially empty sella. Vascular: Calcific intracranial atherosclerosis. No hyperdense vessel identified. Skull: No evidence of acute fracture. Sinuses/Orbits: Opacified anterior left ethmoid air cells. Otherwise, visualized sinuses are clear. Unremarkable orbits. Other: No mastoid effusions.  Cerumen in bilateral EACs. CT CERVICAL SPINE FINDINGS Alignment: Similar mild anterolisthesis of C3 on C4, C4 on C5, and C5 on C6. No new sagittal subluxation. Skull base and vertebrae: Unchanged vertebral body heights. No evidence of acute fracture. Soft tissues and spinal canal: No prevertebral fluid or swelling. No visible canal hematoma. Disc levels:  Similar multilevel degenerative  change, mild for age. Upper chest: Interlobular septal thickening in the right greater than left lung apices with possible small layering right pleural effusion and emphysema. IMPRESSION: CT head: 1. No evidence of acute intracranial abnormality. 2. 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 CT cervical spine: 1. No evidence of acute fracture or traumatic malalignment. 2. Partially imaged interlobular septal thickening in the lung apices, compatible with mild interstitial edema. Possible small layering right pleural effusion. Emphysema (ICD10-J43.9). Electronically Signed   By: Margaretha Sheffield M.D.   On: 06/27/2021 08:10   CT Cervical Spine Wo Contrast  Result Date: 06/26/2021 CLINICAL DATA:  Mental status change, unknown cause; Neck pain, acute, no red flags EXAM: CT HEAD WITHOUT CONTRAST CT CERVICAL SPINE WITHOUT CONTRAST TECHNIQUE: Multidetector CT imaging of the head and cervical spine was performed following the standard protocol without intravenous contrast. Multiplanar CT image reconstructions of the cervical spine were also generated. RADIATION  DOSE REDUCTION: This exam was performed according to the departmental dose-optimization program which includes automated exposure control, adjustment of the mA and/or kV according to patient size and/or use of iterative reconstruction technique. COMPARISON:  CT cervical spine 03/28/2020 FINDINGS: CT HEAD FINDINGS BRAIN: BRAIN Patchy and confluent areas of decreased attenuation are noted throughout the deep and periventricular white matter of the cerebral hemispheres bilaterally, compatible with chronic microvascular ischemic disease. Right frontal lobe encephalomalacia. No evidence of large-territorial acute infarction. No parenchymal hemorrhage. No mass lesion. No extra-axial collection. No mass effect or midline shift. No hydrocephalus. Basilar cisterns are patent. Vascular: No hyperdense vessel. Skull: No acute fracture or focal lesion. Sinuses/Orbits: Paranasal sinuses and mastoid air cells are clear. Bilateral lens replacement. Otherwise the orbits are unremarkable. Other: None. CT CERVICAL SPINE FINDINGS Alignment: Stable grade 1 anterolisthesis of C3 on C4, C4 on C5, C5 on C6. Skull base and vertebrae: Mild multilevel degenerative changes of the spine. No associated severe osseous central canal or neural foraminal stenosis. No acute fracture. No aggressive appearing focal osseous lesion or focal pathologic process. Soft tissues and spinal canal: No prevertebral fluid or swelling. No visible canal hematoma. Upper chest: Unremarkable. Other: Atherosclerotic plaque of the carotid arteries within the neck. IMPRESSION: 1. No acute intracranial abnormality. 2. No acute displaced fracture or traumatic listhesis of the cervical spine. Electronically Signed   By: Iven Finn M.D.   On: 06/26/2021 20:41   DG Chest Portable 1 View  Result Date: 06/27/2021 CLINICAL DATA:  Shortness of breath.  Left leg pain.  Fall. EXAM: PORTABLE CHEST 1 VIEW COMPARISON:  06/26/2021 FINDINGS: Left chest wall ICD is noted with  leads in the right atrial appendage and right ventricle. Cardiac enlargement. Lung volumes are low. Mild diffuse increase interstitial markings are noted bilaterally concerning for mild edema. No airspace consolidation. Atelectasis is noted within the right base. IMPRESSION: 1. Cardiac enlargement and mild pulmonary edema. 2. Low lung volumes with right lung base atelectasis. Electronically Signed   By: Kerby Moors M.D.   On: 06/27/2021 07:53      Impression / Plan:   ANALYSSA DOWNS is a 81 y.o. 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  Likely decompensated liver cirrhosis with possible infection which is being  ruled out, ascites and possible chronic blood loss from AVMs.  History is not suggestive of a significant GI bleed.  Denies any hematemesis or melena.  She has had a fibula fracture questionable blood loss.   Plan 1.  I believe she is scheduled for an ultrasound paracentesis would recommend to rule out spontaneous bacterial peritonitis with appropriate labs 2.  Rehydrate stabilize and then hemodynamically stable will perform an EGD and treat any possible AVMs she had in the stomach like she has had on 2 other occasions when she is more stable as presently she is hypotensive.  Consider full sepsis screen as she is immunocompromised as she is a cirrhotic 3.  Suggest IV antibiotics for GI bleed in the setting of liver cirrhosis. 4.  IV PPI.   Thank you for involving me in the care of this patient.      LOS: 0 days   Heather Bellows, MD  06/27/2021, 9:10 AM

## 2021-06-27 NOTE — ED Notes (Signed)
MD Isaacs notified of pt rectal temperature at this time

## 2021-06-27 NOTE — Progress Notes (Signed)
GOALS OF CARE DISCUSSION  The Clinical status was relayed to family in detail- Daughter [name] over the phone  Updated and notified of patients medical condition- Patient remains unresponsive and will not open eyes to command.   ABD distention +liver cirrhosis +hypoxia +fibular fracture +chronic pain syndrome +hypotension +hepato-renal syndrome  Progressive decline in physical and mental function with pain and suffering  Explained to family course of therapy and the modalities      Patient with Progressive multiorgan failure with a very high probablity of a very minimal chance of meaningful recovery despite all aggressive and optimal medical therapy.   Family understands the situation.  They have consented and agreed to DNR status  Family are satisfied with Plan of action and management. All questions answered  Additional CC time 30 mins   Heather Peterson Patricia Pesa, M.D.  [facility]  Medical Director [place] Director [facility] Cardio-Pulmonary Department

## 2021-06-27 NOTE — Progress Notes (Signed)
An USGPIV (ultrasound guided PIV)  to LAFA 20g 1.88"has been placed  for short-term vasopressor infusion. A correctly placed ivWatch must be used when administering Vasopressors. Should this treatment be needed beyond 72 hours, central line access should be obtained.  It will be the responsibility of the bedside nurse to follow best practice to prevent extravasations.

## 2021-06-27 NOTE — ED Notes (Signed)
MD Isaacs instructed this RN to place purewick on this pt in the mean time in hopes of collecting sample while waiting.

## 2021-06-27 NOTE — Progress Notes (Signed)
PHARMACY CONSULT NOTE  Pharmacy Consult for Electrolyte Monitoring and Replacement   Recent Labs: Potassium (mmol/L)  Date Value  06/27/2021 4.1   Magnesium (mg/dL)  Date Value  06/26/2021 2.3   Calcium (mg/dL)  Date Value  06/27/2021 8.5 (L)   Calcium, Total (PTH) (mg/dL)  Date Value  09/07/2019 8.6 (L)   Albumin (g/dL)  Date Value  06/27/2021 2.9 (L)   Phosphorus (mg/dL)  Date Value  02/28/2021 5.6 (H)   Sodium (mmol/L)  Date Value  06/27/2021 135  03/31/2017 140   Corrected Ca 9.4 mg/dL  Assessment: 81 y.o. female with past medical history of CAD, CKD, AICD, cirrhosis, requiring recurrent paracentesis, multiple myeloma, here with fall. Pharmacy is asked to follow and replace electrolytes while in the  CCU.  Goal of Therapy:  Electrolytes WNL  Plan:  No electrolyte replacement warranted for today Recheck electrolytes in am  Dallie Piles ,PharmD Clinical Pharmacist 06/27/2021 10:56 AM

## 2021-06-27 NOTE — H&P (Signed)
NAME:  Heather Peterson, MRN:  [id number], DOB:  Aug 06, 1940, LOS: 0 ADMISSION DATE:  06/27/2021, CONSULTATION DATE:  06/27/2021 REFERRING MD: Dr. Bland Span, CHIEF COMPLAINT: Abdominal Distension   History of Present Illness:  This is a 81 yo female who presented to [facility] ER via EMS on 02/15 with c/o abdominal distension and recurrents falls. Per EMS upon their arrival at pts home pt hypotensive bp 84/48, therefore 500 ml iv fluid bolus administered.  She was also placed on 3L O2 via nasal canula (wears prn home O2).    She previously presented to the ER on 02/14 via EMS with c/o fluid retention in her abdomen and weeping of bilateral lower extremities.  She reported requiring paracentesis x2 over the past month due to recurrent ascites.  She also reported she had been previously scheduled for an outpatient paracentesis on 02/16. She also endorsed a headache, decreased oral intake (onset 02/12), nausea/vomiting, and some shortness of breath.  At that time bp borderline 81/48.  CXR, EKG, CT Head/Cervical Spine were negative.  Lab results: T4, free (Direct) 0.42/hgb 7.4, and ammonia 18.  Pt deemed stable and did not require hospital admission.    ED Course During current ER presentation pt hypotensive bp 79/58 and hypothermic temp 95.8 degrees F rectally.  Pts baseline sbp 85~100 at baseline, which she's on chronic midodrine.  Lab results revealed BUN 50, creatinine 2.53, calcium 8.5, albumin 2.9, BNP 869.8, troponin 28, pct 0.32, lactic acid 1.2, and hgb 6.7.  Pt also hemoccult positive, therefore received 1 unit of pRBC's.  Pt denies taking NSAID's, Goody Powder's, or anticoagulation.  She does take 81 mg of aspirin daily.  GI consulted Dr. Vicente Males recommended starting empiric abx, octreotide, and protonix.  CT Head negative for acute abnormality.  CT Cervical Spine revealed no evidence of acute fracture or traumatic malalignment, however concerned for emphysema and possible mild interstitial edema vs. small  layering right pleural effusion.  Left tibia/fibula xray revealed acute fracture of the proximal metadiaphysis of the fibula.   Orthopedic surgeon Dr. Roland Rack consulted per ED MD with recommendation to place a knee immobilizer for now. PCCM team contacted for ICU admission due to possible need of vasopressor therapy due to hypotension.    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 Sepsis  Cultures:  MRSA PCR 02/15>>negative  Blood x2 02/15>>negative  Resp Panel by RT-PCR (Flu A&B, Covid) 02/15>>negative   Interim History / Subjective:  Pt lethargic, however arousable and able to follow commands.  On O2@4L  via nasal canula with O2 sats 94-96% no s/sx of respiratory distress.    Objective   Blood pressure (!) 93/58, pulse 74, temperature (!) 95.9 F (35.5 C), resp. rate 15, SpO2 96 %.  No intake or output data in the 24 hours ending 06/27/21 1110 There were no vitals filed for this visit.  Examination: General: acute on chronically ill appearing female, NAD resting in bed HENT: supple, no JVD Lungs: diminished  throughout, even, non labored  Cardiovascular: nsr, rrr, no R/G, 2+ radial/1+ distal pulses, 1+ bilateral lower extremity edema with weeping  Abdomen: hypoactive BS x4, distended (ascites), taut  Extremities: moves all extremities Neuro: lethargic but oriented, follows commands, PERRLA  GU: temp foley in place draining clear yellow urine   Resolved Hospital Problem list   N/A  Assessment & Plan:   Hypotension secondary to sepsis possibly due to decompensated liver cirrhosis with possible SBP vs. possible acute on chronic anemia  - Continuous telemetry monitoring  - Continue scheduled outpatient midodrine; prn levophed gtt to maintain map >60 and sbp >80 - Cortisol level pending to assess for adrenal insufficiency   Chronic combined systolic and diastolic CHF~EF 25% Hx: Pacemaker, Ventricular Fibrillation/Tachycardia s/p Dual Chamber ICD, HTN, and Cardiomyopathy  - Continue outpatient low dose simvastatin; due to hypotension will hold mexitil and torsemide for now   Acute on chronic hypoxic respiratory failure secondary to COPD and small right pleural effusion along with atelectasis  - Prn supplemental O2 for hypoxia and/or dyspnea  - Maintain O2 sats 88% to 92% - Aggressive pulmonary hygiene   Stage IV CKD - Trend BMP  - Replace electrolytes as indicated  - Monitor UOP - Avoid nephrotoxic medications  - Will consult nephrology if pt becomes oliguric   Possible decompensated liver cirrhosis (NASH) with possible SBP Acute on chronic anemia pt with hx of angiodysplastic lesions  - Serial H&H q6hrs for now  - Will check coags - Transfuse for hgb <8  - GI consulted appreciate input~continue octreotide/protonix gtts and once stable will proceed with EGD  - Will start empiric Ceftriaxone for possible SBP   Hypothyroidism  - Resume outpatient synthroid   Multiple myeloma  - Will consult oncology   Multiple falls pt with hx of lumbar stenosis with neurogenic claudication s/p  right L4-5 TFESI 01/11/2021 with no significant improvement in back or right leg pain  Acute fracture of the proximal metadiaphysis of the fibula s/p fall  CT Head/Cervical Spine 02/15 negative for acute intracranial abnormality, acute fracture, or traumatic malalignment - Orthopedics consulted appreciate input~recommended knee immobilizer  - Fall precautions  - Once stable will place orders for PT/OT  - Bilateral lower extremities venous doppler ultrasound to r/o VTE  - Will resume outpatient gabapentin once mentation improves   Best Practice (right click and "Reselect all SmartList Selections" daily)   Diet/type: NPO DVT prophylaxis: SCD GI prophylaxis: PPI Lines: N/A Foley:  Yes, and it is still needed Code Status:  DNR Last date of multidisciplinary goals of care discussion [06/27/2021]  Labs   CBC: Recent Labs  Lab 06/26/21 1813 06/27/21 0634  WBC 11.0* 8.9  NEUTROABS 8.9* 6.4  HGB 7.4* 6.7*  HCT 25.4* 22.9*  MCV 94.8 95.0  PLT 231 956    Basic Metabolic Panel: Recent Labs  Lab 06/26/21 1813 06/26/21 1818 06/27/21 0634  NA 135  --  135  K 4.5  --  4.1  CL 100  --  101  CO2 24  --  25  GLUCOSE 117*  --  94  BUN 52*  --  50*  CREATININE 2.49*  --  2.53*  CALCIUM 9.2  --  8.5*  MG  --  2.3  --  GFR: Estimated Creatinine Clearance: 17.9 mL/min (A) (by C-G formula based on SCr of 2.53 mg/dL (H)). Recent Labs  Lab 06/26/21 1813 06/27/21 0634 06/27/21 0755 06/27/21 1008  PROCALCITON  --   --  0.32  --   WBC 11.0* 8.9  --   --   LATICACIDVEN 1.1 1.2  --  1.2    Liver Function Tests: Recent Labs  Lab 06/26/21 1813 06/27/21 0634  AST 18 17  ALT 9 8  ALKPHOS 114 104  BILITOT 0.6 0.7  PROT 6.5 6.0*  ALBUMIN 3.3* 2.9*   Recent Labs  Lab 06/27/21 0634  LIPASE 49   Recent Labs  Lab 06/26/21 2108  AMMONIA 18    ABG No results found for: PHART, PCO2ART, PO2ART, HCO3, TCO2, ACIDBASEDEF, O2SAT   Coagulation Profile: No results for input(s):  INR, PROTIME in the last 168 hours.  Cardiac Enzymes: No results for input(s): CKTOTAL, CKMB, CKMBINDEX, TROPONINI in the last 168 hours.  HbA1C: Hgb A1c MFr Bld  Date/Time Value Ref Range Status  02/14/2021 03:52 AM 7.1 (H) 4.8 - 5.6 % Final    Comment:    (NOTE)         Prediabetes: 5.7 - 6.4         Diabetes: >6.4         Glycemic control for adults with diabetes: <7.0   06/18/2020 05:57 PM 6.0 (H) 4.8 - 5.6 % Final    Comment:    (NOTE) Pre diabetes:          5.7%-6.4%  Diabetes:              >6.4%  Glycemic control for   <7.0% adults with diabetes     CBG: No results for input(s): GLUCAP in the last 168 hours.  Review of Systems:   Unable to assess pt lethargic   Past Medical History:  She,  has a past medical history of Anxiety, Chronic combined systolic and diastolic CHF (congestive heart failure) ([place]), Chronic kidney disease, Coronary artery disease, Depression, Diabetes mellitus without complication ([place]), Diabetes mellitus, type II ([place]), Hypertension, MI (myocardial infarction) ([place]), Pacemaker, 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[place]), Prolonged Q-T interval on ECG, and Thyroid disease.   Surgical History:   Past Surgical History:  Procedure Laterality Date   CENTRAL LINE INSERTION  03/11/2017   Procedure: CENTRAL LINE INSERTION;  Surgeon: Leonie Man, MD;  Location: [place] [facility];  Service: Cardiovascular;;   CHOLECYSTECTOMY     COLONOSCOPY WITH PROPOFOL N/A 09/01/2019   Procedure: COLONOSCOPY WITH PROPOFOL;  Surgeon: Toledo, Benay Pike, MD;  Location: [facility] ENDOSCOPY;  Service: Gastroenterology;  Laterality: N/A;   CORONARY STENT INTERVENTION W/IMPELLA N/A 03/11/2017   Procedure: Coronary Stent Intervention w/Impella;  Surgeon: Leonie Man, MD;  Location: [place] [facility];  Service: Cardiovascular;  Laterality: N/A;   ESOPHAGOGASTRODUODENOSCOPY (EGD) WITH PROPOFOL N/A 09/01/2019   Procedure: ESOPHAGOGASTRODUODENOSCOPY (EGD) WITH PROPOFOL;   Surgeon: Toledo, Benay Pike, MD;  Location: [facility] ENDOSCOPY;  Service: Gastroenterology;  Laterality: N/A;   ESOPHAGOGASTRODUODENOSCOPY (EGD) WITH PROPOFOL N/A 09/08/2019   Procedure: ESOPHAGOGASTRODUODENOSCOPY (EGD) WITH PROPOFOL;  Surgeon: [name], MD;  Location[facility] ENDOSCOPY;  Service: Gastroenterology;  Laterality: N/A;   ESOPHAGOGASTRODUODENOSCOPY (EGD) WITH PROPOFOL N/A 02/15/2021   Procedure: ESOPHAGOGASTRODUODENOSCOPY (EGD) WITH PROPOFOL;  Surgeon: [name], MD;  Location: [facility] ENDOSCOPY;  Service: Gastroenterology;  Laterality: N/A;   EYE SURGERY     HIP ARTHROPLASTY Right 03/29/2020   Procedure: ARTHROPLASTY BIPOLAR HIP (HEMIARTHROPLASTY);  Surgeon: Corky Mull, MD;  Location: [facility] ORS;  Service: Orthopedics;  Laterality: Right;   INTRAVASCULAR PRESSURE WIRE/FFR STUDY N/A 03/11/2017   Procedure: INTRAVASCULAR PRESSURE WIRE/FFR STUDY;  Surgeon: Leonie Man, MD;  Location: [place] [facility];  Service: Cardiovascular;  Laterality: N/A;   INTRAVASCULAR ULTRASOUND/IVUS N/A 03/11/2017   Procedure: Intravascular Ultrasound/IVUS;  Surgeon: Leonie Man, MD;  Location: [place] [facility];  Service: Cardiovascular;  Laterality: N/A;   LEFT HEART CATH AND CORONARY ANGIOGRAPHY N/A 03/05/2017   Procedure: LEFT HEART CATH AND CORONARY ANGIOGRAPHY;  Surgeon: Isaias Cowman, MD;  Location: [place] [facility];  Service: Cardiovascular;  Laterality: N/A;   PACEMAKER IMPLANT     pacemaker/defibrillator Left      Social History:   reports that she has been smoking e-cigarettes and cigarettes. She has been smoking an average of .25 packs per day. She has never used smokeless tobacco. She reports that she does not currently use alcohol. She reports current drug use.   Family History:  Her family history includes Depression in her brother, brother, and sister; Heart attack in her father; Hypertension in her father.   Allergies Allergies  Allergen Reactions   Celebrex  [Celecoxib] Anaphylaxis   Glipizide Anaphylaxis   Levaquin [Levofloxacin In D5w] Other (See Comments)    Heart arrhthymias   Levofloxacin Other (See Comments) and Palpitations    ICD fired   Lisinopril Swelling    Lip and facial swelling   Sulfa Antibiotics Other (See Comments) and Anaphylaxis    Reaction: unknown   Metformin Other (See Comments)    Gi tolerance    Penicillins Rash and Other (See Comments)    Has patient had a PCN reaction causing immediate rash, facial/tongue/throat swelling, SOB or lightheadedness with hypotension: Unknown Has patient had a PCN reaction causing severe rash involving mucus membranes or skin necrosis: No Has patient had a PCN reaction that required hospitalization: No Has patient had a PCN reaction occurring within the last 10 years: No If all of the above answers are "NO", then may proceed with Cephalosporin use.      Home Medications  Prior to Admission medications   Medication Sig Start Date End Date Taking? Authorizing Provider  allopurinol (ZYLOPRIM) 300 MG tablet Take 300 mg by mouth daily. 04/23/21   [provider]  aspirin EC 81 MG tablet Take 81 mg by mouth at bedtime.     [provider]  colchicine 0.6 MG tablet Take by mouth.    [provider]  dexamethasone (DECADRON) 4 MG tablet Take 5 tablets (20 mg total) by mouth daily. Take the day after Velcade on days 2,5,9,12. Take with breakfast Patient not taking: Reported on 06/26/2021 11/14/20   Lloyd Huger, MD  docusate sodium (COLACE) 100 MG capsule Take 100 mg by mouth 2 (two) times daily.    [provider]  Ensure Max Protein (ENSURE MAX PROTEIN) LIQD Take 330 mLs (11 oz total) by mouth 2 (two) times daily between meals. 04/03/20   [name], MD  fexofenadine (ALLEGRA) 180 MG tablet Take 180 mg by mouth daily.    [provider]  FLUoxetine (PROZAC) 10 MG capsule Take 10 mg by mouth daily. 06/29/19   [provider]   gabapentin (NEURONTIN) 100 MG capsule TAKE 1 CAPSULE(100 MG) BY MOUTH THREE TIMES DAILY Patient not taking: Reported on 05/09/2021 12/29/20   Lloyd Huger, MD  gabapentin (NEURONTIN) 600 MG tablet Take 600 mg by mouth daily.  Patient takes 100 mg QAM, 700 mg QHS 11/07/20   [provider]  hydrocortisone (ANUSOL-HC) 2.5 % rectal cream Apply 1 application topically 2 (two) times daily. Patient not taking: Reported on 05/23/2021 02/12/20   [provider]  Lactulose 20 GM/30ML SOLN Take 30 mLs (20 g total) by mouth in the morning and at bedtime. Patient not taking: Reported on 05/09/2021 06/23/20   Jacquelin Hawking, NP  levothyroxine (SYNTHROID) 50 MCG tablet Take 50 mcg by mouth daily before breakfast. 08/14/20   [provider]  levothyroxine (SYNTHROID) 75 MCG tablet Take 1 tablet (75 mcg total) by mouth daily before breakfast. On Monday, Wednesday and Friday 04/03/20   [name], MD  lidocaine (LIDODERM) 5 % Place 1 patch onto the skin every 12 (twelve) hours. Remove & Discard patch within 12 hours or as directed by MD 06/26/21   Carrie Mew, MD  mexiletine (MEXITIL) 200 MG capsule Take 1 capsule (200 mg total) by mouth every 12 (twelve) hours. 03/14/17   Chanetta Marshall K, NP  mexiletine (MEXITIL) 200 MG capsule Take by mouth. Patient not taking: Reported on 06/26/2021 05/09/21   [provider]  midodrine (PROAMATINE) 10 MG tablet Take 10 mg by mouth 3 (three) times daily. 06/13/21   [provider]  OXYGEN Inhale 2 L into the lungs continuous.    [provider]  pantoprazole (PROTONIX) 40 MG tablet Take 40 mg by mouth daily.  05/16/19   [provider]  polyethylene glycol (MIRALAX) 17 g packet Take 17 g by mouth daily. Patient not taking: Reported on 05/09/2021 06/18/20   Nance Pear, MD  Probiotic Product (PROBIOTIC DAILY PO) Take 1 tablet by mouth daily. Patient not taking: Reported on 06/26/2021    [provider]  SANTYL ointment Apply topically. Patient not taking: Reported on 05/09/2021 01/01/21   [provider]  simvastatin (ZOCOR) 20 MG tablet Take 20 mg by mouth at bedtime. 05/15/21   [provider]  spironolactone (ALDACTONE) 25 MG tablet Take 0.5 tablets (12.5 mg total) by mouth every other day. Patient not taking: Reported on 06/20/2021 01/03/21   Alisa Graff, FNP  torsemide (DEMADEX) 20 MG tablet Take 40 mg by mouth as needed.    [provider]  traZODone (DESYREL) 50 MG tablet Take 50 mg by mouth at bedtime.    [provider]     Critical care time: 60 minutes     Rosilyn Mings, [place] Pager [phone] (please enter 7 digits) PCCM Consult Pager [phone] (please enter 7 digits)

## 2021-06-27 NOTE — Procedures (Signed)
PROCEDURE SUMMARY: Procedure done at the bedside in the ICU secondary to patient's critical status.   Successful US guided paracentesis from left abdomen.  Yielded 3.2 L of clear yellow fluid.  No immediate complications.  Pt tolerated well.   Specimen sent for labs.  EBL < 2 mL  Theresa Duty, NP 06/27/2021 3:40 PM

## 2021-06-27 NOTE — ED Notes (Signed)
Pt Lethargic and BP hypotensive at this time, once blood started and pt more stable will cath. MD Ellender Hose confirms okay to wait until pt more stable.

## 2021-06-27 NOTE — Consult Note (Signed)
ORTHOPAEDIC CONSULTATION  REQUESTING PHYSICIAN: Flora Lipps, MD  Chief Complaint:   Left knee/leg pain.  History of Present Illness: Heather Peterson is a 81 y.o. female with multiple medical problems including coronary artery disease, status post MI x5, congestive heart failure, diabetes, hypertension, chronic kidney disease, and depression who presented to the emergency room with significant hypotension causing her to fall frequently at home.  She was given fluid resuscitation and admitted to the ICU for medical stabilization.  During her work-up in the emergency room, x-rays of her left tibia and fibula demonstrated an essentially nondisplaced fracture involving the proximal fibular neck, so orthopedic consultation was requested.  The patient notes that she has fallen numerous times over the past few days, and so was not sure as to when she may have actually incurred the fracture.  She denies any numbness or paresthesias down her leg to her foot.  Past Medical History:  Diagnosis Date   Anxiety    Chronic combined systolic and diastolic CHF (congestive heart failure) (HCC)    Chronic kidney disease    Coronary artery disease    Depression    Diabetes mellitus without complication (HCC)    Diabetes mellitus, type II ([place])    Hypertension    MI (myocardial infarction) ([place])    x 5   Pacemaker    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HCC)    Prolonged Q-T interval on ECG    Thyroid disease    Past Surgical History:  Procedure Laterality Date   CENTRAL LINE INSERTION  03/11/2017   Procedure: CENTRAL LINE INSERTION;  Surgeon: Leonie Man, MD;  Location: [place] [facility];  Service: Cardiovascular;;   CHOLECYSTECTOMY     COLONOSCOPY WITH PROPOFOL N/A 09/01/2019   Procedure: COLONOSCOPY WITH PROPOFOL;  Surgeon: Toledo, Benay Pike, MD;  Location: [facility] ENDOSCOPY;  Service: Gastroenterology;  Laterality: N/A;   CORONARY STENT  INTERVENTION W/IMPELLA N/A 03/11/2017   Procedure: Coronary Stent Intervention w/Impella;  Surgeon: Leonie Man, MD;  Location: [place] [facility];  Service: Cardiovascular;  Laterality: N/A;   ESOPHAGOGASTRODUODENOSCOPY (EGD) WITH PROPOFOL N/A 09/01/2019   Procedure: ESOPHAGOGASTRODUODENOSCOPY (EGD) WITH PROPOFOL;  Surgeon: Toledo, Benay Pike, MD;  Location: [facility] ENDOSCOPY;  Service: Gastroenterology;  Laterality: N/A;   ESOPHAGOGASTRODUODENOSCOPY (EGD) WITH PROPOFOL N/A 09/08/2019   Procedure: ESOPHAGOGASTRODUODENOSCOPY (EGD) WITH PROPOFOL;  Surgeon: [name], MD;  Location: [facility] ENDOSCOPY;  Service: Gastroenterology;  Laterality: N/A;   ESOPHAGOGASTRODUODENOSCOPY (EGD) WITH PROPOFOL N/A 02/15/2021   Procedure: ESOPHAGOGASTRODUODENOSCOPY (EGD) WITH PROPOFOL;  Surgeon: [name], MD;  Location: [facility] ENDOSCOPY;  Service: Gastroenterology;  Laterality: N/A;   EYE SURGERY     HIP ARTHROPLASTY Right 03/29/2020   Procedure: ARTHROPLASTY BIPOLAR HIP (HEMIARTHROPLASTY);  Surgeon: Corky Mull, MD;  Location: [facility] ORS;  Service: Orthopedics;  Laterality: Right;   INTRAVASCULAR PRESSURE WIRE/FFR STUDY N/A 03/11/2017   Procedure: INTRAVASCULAR PRESSURE WIRE/FFR STUDY;  Surgeon: Leonie Man, MD;  Location: [place] [facility];  Service: Cardiovascular;  Laterality: N/A;   INTRAVASCULAR ULTRASOUND/IVUS N/A 03/11/2017   Procedure: Intravascular Ultrasound/IVUS;  Surgeon: Leonie Man, MD;  Location: [place] [facility];  Service: Cardiovascular;  Laterality: N/A;   LEFT HEART CATH AND CORONARY ANGIOGRAPHY N/A 03/05/2017   Procedure: LEFT HEART CATH AND CORONARY ANGIOGRAPHY;  Surgeon: Isaias Cowman, MD;  Location: [place] [facility];  Service: Cardiovascular;  Laterality: N/A;   PACEMAKER IMPLANT     pacemaker/defibrillator Left    Social History   Socioeconomic History  Marital status: Married    Spouse name: rodney   Number of children: 2   Years of education: Not on file    Highest education level: High school graduate  Occupational History   Not on file  Tobacco Use   Smoking status: Every Day    Packs/day: 0.25    Types: E-cigarettes, Cigarettes   Smokeless tobacco: Never  Vaping Use   Vaping Use: Former  Substance and Sexual Activity   Alcohol use: Not Currently    Comment: occasionally   Drug use: Yes    Comment: prescribed pain meds   Sexual activity: Not Currently  Other Topics Concern   Not on file  Social History Narrative   Not on file   Social Determinants of Health   Financial Resource Strain: Not on file  Food Insecurity: Not on file  Transportation Needs: Not on file  Physical Activity: Not on file  Stress: Not on file  Social Connections: Not on file   Family History  Problem Relation Age of Onset   Hypertension Father    Heart attack Father    Depression Sister    Depression Brother    Depression Brother    Allergies  Allergen Reactions   Celebrex [Celecoxib] Anaphylaxis   Glipizide Anaphylaxis   Levaquin [Levofloxacin In D5w] Other (See Comments)    Heart arrhthymias   Levofloxacin Other (See Comments) and Palpitations    ICD fired   Lisinopril Swelling    Lip and facial swelling   Sulfa Antibiotics Other (See Comments) and Anaphylaxis    Reaction: unknown   Metformin Other (See Comments)    Gi tolerance    Penicillins Rash and Other (See Comments)    Has patient had a PCN reaction causing immediate rash, facial/tongue/throat swelling, SOB or lightheadedness with hypotension: Unknown Has patient had a PCN reaction causing severe rash involving mucus membranes or skin necrosis: No Has patient had a PCN reaction that required hospitalization: No Has patient had a PCN reaction occurring within the last 10 years: No If all of the above answers are "NO", then may proceed with Cephalosporin use.    Prior to Admission medications   Medication Sig Start Date End Date Taking? Authorizing Provider  allopurinol  (ZYLOPRIM) 300 MG tablet Take 300 mg by mouth daily. 04/23/21   [provider]  aspirin EC 81 MG tablet Take 81 mg by mouth at bedtime.     [provider]  colchicine 0.6 MG tablet Take by mouth.    [provider]  docusate sodium (COLACE) 100 MG capsule Take 100 mg by mouth 2 (two) times daily.    [provider]  Ensure Max Protein (ENSURE MAX PROTEIN) LIQD Take 330 mLs (11 oz total) by mouth 2 (two) times daily between meals. 04/03/20   [name], MD  fexofenadine (ALLEGRA) 180 MG tablet Take 180 mg by mouth daily.    [provider]  FLUoxetine (PROZAC) 10 MG capsule Take 10 mg by mouth daily. 06/29/19   [provider]  gabapentin (NEURONTIN) 100 MG capsule TAKE 1 CAPSULE(100 MG) BY MOUTH THREE TIMES DAILY Patient not taking: Reported on 05/09/2021 12/29/20   Lloyd Huger, MD  gabapentin (NEURONTIN) 600 MG tablet Take 600 mg by mouth daily. Patient takes 100 mg QAM, 700 mg QHS 11/07/20   [provider]  hydrocortisone (ANUSOL-HC) 2.5 % rectal cream Apply 1 application topically 2 (two) times daily. Patient not taking: Reported on 05/23/2021 02/12/20   [provider]  levothyroxine (SYNTHROID) 50 MCG tablet Take 50 mcg by mouth daily before breakfast. 08/14/20   [provider]  levothyroxine (SYNTHROID) 75 MCG tablet Take 1 tablet (75 mcg total) by mouth daily before breakfast. On Monday, Wednesday and Friday 04/03/20   [name], MD  lidocaine (LIDODERM) 5 % Place 1 patch onto the skin every 12 (twelve) hours. Remove & Discard patch within 12 hours or as directed by MD 06/26/21   Carrie Mew, MD  mexiletine (MEXITIL) 200 MG capsule Take 1 capsule (200 mg total) by mouth every 12 (twelve) hours. 03/14/17   Chanetta Marshall K, NP  mexiletine (MEXITIL) 200 MG capsule Take by mouth. Patient not taking: Reported on 06/26/2021 05/09/21   [provider]  midodrine (PROAMATINE) 10 MG tablet Take 10  mg by mouth 3 (three) times daily. 06/13/21   [provider]  OXYGEN Inhale 2 L into the lungs continuous.    [provider]  pantoprazole (PROTONIX) 40 MG tablet Take 40 mg by mouth daily.  05/16/19   [provider]  Probiotic Product (PROBIOTIC DAILY PO) Take 1 tablet by mouth daily. Patient not taking: Reported on 06/26/2021    [provider]  SANTYL ointment Apply topically. Patient not taking: Reported on 05/09/2021 01/01/21   [provider]  simvastatin (ZOCOR) 20 MG tablet Take 20 mg by mouth at bedtime. 05/15/21   [provider]  spironolactone (ALDACTONE) 25 MG tablet Take 0.5 tablets (12.5 mg total) by mouth every other day. Patient not taking: Reported on 06/20/2021 01/03/21   Alisa Graff, FNP  torsemide (DEMADEX) 20 MG tablet Take 40 mg by mouth as needed.    [provider]  traZODone (DESYREL) 50 MG tablet Take 50 mg by mouth at bedtime.    [provider]   DG Chest 2 View  Result Date: 06/26/2021 CLINICAL DATA:  Shortness of breath EXAM: CHEST - 2 VIEW COMPARISON:  Chest radiograph dated February 12, 2021 FINDINGS: The heart is markedly enlarged. 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 No focal consolidation or pleural effusion. No acute osseous abnormality. IMPRESSION: Cardiomegaly without evidence of focal consolidation or pleural effusion. Electronically Signed   By: Keane Police D.O.   On: 06/26/2021 18:39   DG Tibia/Fibula Left  Result Date: 06/27/2021 CLINICAL DATA:  Shortness of breath.  Status post fall. EXAM: LEFT TIBIA AND FIBULA - 2 VIEW COMPARISON:  None. FINDINGS: There is a fracture deformity involving the proximal metadiaphysis of the fibula. The fracture fragments appear to be in near anatomic alignment. No additional fracture or dislocation identified. IMPRESSION: Acute fracture of the proximal metadiaphysis of the fibula. Electronically Signed   By: Kerby Moors M.D.   On: 06/27/2021 07:56   DG Ankle Complete Left  Result Date: 06/27/2021 CLINICAL DATA:  Multiple falls, ankle injury, initial encounter. EXAM: LEFT ANKLE COMPLETE - 3+ VIEW COMPARISON:  Left tibia fibula 06/27/2021. FINDINGS: Ankle mortise is intact.  No fracture. IMPRESSION: No acute findings. Electronically Signed   By: Lorin Picket M.D.   On: 06/27/2021 08:50   DG Abd 1 View  Result Date: 06/27/2021 CLINICAL DATA:  Abdominal distension. EXAM: ABDOMEN - 1 VIEW COMPARISON:  CT 02/12/2021.  Radiographs 07/16/2019. FINDINGS: 1330 hours. Two views submitted. The bowel gas pattern appears nonobstructive. There is no supine evidence of bowel wall thickening or free intraperitoneal air. Scattered vascular calcifications are noted. Patient is status post cholecystectomy and right total  hip arthroplasty. IMPRESSION: No radiographic evidence of active abdominal process. No findings to suggest recurrent large volume ascites. Electronically Signed   By: Richardean Sale M.D.   On: 06/27/2021 13:43   CT HEAD WO CONTRAST (5MM)  Result Date: 06/27/2021 CLINICAL DATA:  Head trauma, moderate-severe; Neck trauma (Age >= 65y) EXAM: CT HEAD WITHOUT CONTRAST CT CERVICAL SPINE WITHOUT CONTRAST TECHNIQUE: Multidetector CT imaging of the head and cervical spine was performed following the standard protocol without intravenous contrast. Multiplanar CT image reconstructions of the cervical spine were also generated. RADIATION DOSE REDUCTION: This exam was performed according to the departmental dose-optimization program which includes automated exposure control, adjustment of the mA and/or kV according to patient size and/or use of iterative reconstruction technique. COMPARISON:  February 14, 23. FINDINGS: CT HEAD FINDINGS Brain: No evidence of acute infarction, hemorrhage, hydrocephalus, extra-axial collection or mass lesion/mass effect. Similar chronic right frontal encephalomalacia. Similar additional focal  encephalomalacia in the right perirolandic frontoparietal cortex. Similar additional patchy white matter hypoattenuation, nonspecific but compatible with chronic microvascular ischemic disease. Partially empty sella. Vascular: Calcific intracranial atherosclerosis. No hyperdense vessel identified. Skull: No evidence of acute fracture. Sinuses/Orbits: Opacified anterior left ethmoid air cells. Otherwise, visualized sinuses are clear. Unremarkable orbits. Other: No mastoid effusions.  Cerumen in bilateral EACs. CT CERVICAL SPINE FINDINGS Alignment: Similar mild anterolisthesis of C3 on C4, C4 on C5, and C5 on C6. No new sagittal subluxation. Skull base and vertebrae: Unchanged vertebral body heights. No evidence of acute fracture. Soft tissues and spinal canal: No prevertebral fluid or swelling. No visible canal hematoma. Disc levels:  Similar multilevel degenerative change, mild for age. Upper chest: Interlobular septal thickening in the right greater than left lung apices with possible small layering right pleural effusion and emphysema. IMPRESSION: CT head: 1. No evidence of acute intracranial abnormality. 2. 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 CT cervical spine: 1. No evidence of acute fracture or traumatic malalignment. 2. Partially imaged interlobular septal thickening in the lung apices, compatible with mild interstitial edema. Possible small layering right pleural effusion. Emphysema (ICD10-J43.9). Electronically Signed   By: Margaretha Sheffield M.D.   On: 06/27/2021 08:10   CT Head Wo Contrast  Result Date: 06/26/2021 CLINICAL DATA:  Mental status change, unknown cause; Neck pain, acute, no red flags EXAM: CT HEAD WITHOUT CONTRAST CT CERVICAL SPINE WITHOUT CONTRAST TECHNIQUE: Multidetector CT imaging of the head and cervical spine was performed following the standard protocol without intravenous contrast. Multiplanar CT image reconstructions of the cervical spine were also  generated. RADIATION DOSE REDUCTION: This exam was performed according to the departmental dose-optimization program which includes automated exposure control, adjustment of the mA and/or kV according to patient size and/or use of iterative reconstruction technique. COMPARISON:  CT cervical spine 03/28/2020 FINDINGS: CT HEAD FINDINGS BRAIN: BRAIN Patchy and confluent areas of decreased attenuation are noted throughout the deep and periventricular white matter of the cerebral hemispheres bilaterally, compatible with chronic microvascular ischemic disease. Right frontal lobe encephalomalacia. No evidence of large-territorial acute infarction. No parenchymal hemorrhage. No mass lesion. No extra-axial collection. No mass effect or midline shift. No hydrocephalus. Basilar cisterns are patent. Vascular: No hyperdense vessel. Skull: No acute fracture or focal lesion. Sinuses/Orbits: Paranasal sinuses and mastoid air cells are clear. Bilateral lens replacement. Otherwise the orbits are unremarkable. Other: None. CT CERVICAL SPINE FINDINGS Alignment: Stable grade 1 anterolisthesis of C3 on C4, C4 on C5, C5 on C6. Skull base and vertebrae: Mild multilevel degenerative changes of the spine.  No associated severe osseous central canal or neural foraminal stenosis. No acute fracture. No aggressive appearing focal osseous lesion or focal pathologic process. Soft tissues and spinal canal: No prevertebral fluid or swelling. No visible canal hematoma. Upper chest: Unremarkable. Other: Atherosclerotic plaque of the carotid arteries within the neck. IMPRESSION: 1. No acute intracranial abnormality. 2. No acute displaced fracture or traumatic listhesis of the cervical spine. Electronically Signed   By: Iven Finn M.D.   On: 06/26/2021 20:41   CT Cervical Spine Wo Contrast  Result Date: 06/27/2021 CLINICAL DATA:  Head trauma, moderate-severe; Neck trauma (Age >= 65y) EXAM: CT HEAD WITHOUT CONTRAST CT CERVICAL SPINE WITHOUT  CONTRAST TECHNIQUE: Multidetector CT imaging of the head and cervical spine was performed following the standard protocol without intravenous contrast. Multiplanar CT image reconstructions of the cervical spine were also generated. RADIATION DOSE REDUCTION: This exam was performed according to the departmental dose-optimization program which includes automated exposure control, adjustment of the mA and/or kV according to patient size and/or use of iterative reconstruction technique. COMPARISON:  February 14, 23. FINDINGS: CT HEAD FINDINGS Brain: No evidence of acute infarction, hemorrhage, hydrocephalus, extra-axial collection or mass lesion/mass effect. Similar chronic right frontal encephalomalacia. Similar additional focal encephalomalacia in the right perirolandic frontoparietal cortex. Similar additional patchy white matter hypoattenuation, nonspecific but compatible with chronic microvascular ischemic disease. Partially empty sella. Vascular: Calcific intracranial atherosclerosis. No hyperdense vessel identified. Skull: No evidence of acute fracture. Sinuses/Orbits: Opacified anterior left ethmoid air cells. Otherwise, visualized sinuses are clear. Unremarkable orbits. Other: No mastoid effusions.  Cerumen in bilateral EACs. CT CERVICAL SPINE FINDINGS Alignment: Similar mild anterolisthesis of C3 on C4, C4 on C5, and C5 on C6. No new sagittal subluxation. Skull base and vertebrae: Unchanged vertebral body heights. No evidence of acute fracture. Soft tissues and spinal canal: No prevertebral fluid or swelling. No visible canal hematoma. Disc levels:  Similar multilevel degenerative change, mild for age. Upper chest: Interlobular septal thickening in the right greater than left lung apices with possible small layering right pleural effusion and emphysema. IMPRESSION: CT head: 1. No evidence of acute intracranial abnormality. 2. 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 CT cervical  spine: 1. No evidence of acute fracture or traumatic malalignment. 2. Partially imaged interlobular septal thickening in the lung apices, compatible with mild interstitial edema. Possible small layering right pleural effusion. Emphysema (ICD10-J43.9). Electronically Signed   By: Margaretha Sheffield M.D.   On: 06/27/2021 08:10   CT Cervical Spine Wo Contrast  Result Date: 06/26/2021 CLINICAL DATA:  Mental status change, unknown cause; Neck pain, acute, no red flags EXAM: CT HEAD WITHOUT CONTRAST CT CERVICAL SPINE WITHOUT CONTRAST TECHNIQUE: Multidetector CT imaging of the head and cervical spine was performed following the standard protocol without intravenous contrast. Multiplanar CT image reconstructions of the cervical spine were also generated. RADIATION DOSE REDUCTION: This exam was performed according to the departmental dose-optimization program which includes automated exposure control, adjustment of the mA and/or kV according to patient size and/or use of iterative reconstruction technique. COMPARISON:  CT cervical spine 03/28/2020 FINDINGS: CT HEAD FINDINGS BRAIN: BRAIN Patchy and confluent areas of decreased attenuation are noted throughout the deep and periventricular white matter of the cerebral hemispheres bilaterally, compatible with chronic microvascular ischemic disease. Right frontal lobe encephalomalacia. No evidence of large-territorial acute infarction. No parenchymal hemorrhage. No mass lesion. No extra-axial collection. No mass effect or midline shift. No hydrocephalus. Basilar cisterns are patent. Vascular: No hyperdense vessel. Skull: No acute fracture or  focal lesion. Sinuses/Orbits: Paranasal sinuses and mastoid air cells are clear. Bilateral lens replacement. Otherwise the orbits are unremarkable. Other: None. CT CERVICAL SPINE FINDINGS Alignment: Stable grade 1 anterolisthesis of C3 on C4, C4 on C5, C5 on C6. Skull base and vertebrae: Mild multilevel degenerative changes of the spine.  No associated severe osseous central canal or neural foraminal stenosis. No acute fracture. No aggressive appearing focal osseous lesion or focal pathologic process. Soft tissues and spinal canal: No prevertebral fluid or swelling. No visible canal hematoma. Upper chest: Unremarkable. Other: Atherosclerotic plaque of the carotid arteries within the neck. IMPRESSION: 1. No acute intracranial abnormality. 2. No acute displaced fracture or traumatic listhesis of the cervical spine. Electronically Signed   By: Iven Finn M.D.   On: 06/26/2021 20:41   DG Chest Portable 1 View  Result Date: 06/27/2021 CLINICAL DATA:  Shortness of breath.  Left leg pain.  Fall. EXAM: PORTABLE CHEST 1 VIEW COMPARISON:  06/26/2021 FINDINGS: Left chest wall ICD is noted with leads in the right atrial appendage and right ventricle. Cardiac enlargement. Lung volumes are low. Mild diffuse increase interstitial markings are noted bilaterally concerning for mild edema. No airspace consolidation. Atelectasis is noted within the right base. IMPRESSION: 1. Cardiac enlargement and mild pulmonary edema. 2. Low lung volumes with right lung base atelectasis. Electronically Signed   By: Kerby Moors M.D.   On: 06/27/2021 07:53    Positive ROS: All other systems have been reviewed and were otherwise negative with the exception of those mentioned in the HPI and as above.  Physical Exam: General:  Alert, no acute distress Psychiatric:  Patient answers questions appropriately, and exhibits normal mood and affect   Cardiovascular:  No pedal edema Respiratory:  No wheezing, non-labored breathing GI:  Abdomen is soft and non-tender Skin:  No lesions in the area of chief complaint Neurologic:  Sensation intact distally Lymphatic:  No axillary or cervical lymphadenopathy  Orthopedic Exam:  Orthopedic examination is limited to the left knee and lower extremity.  There is a soft dressing over the knee to treat an underlying abrasion.  There  is mild swelling around the knee and lower leg.  She has moderate tenderness palpation over the proximal portion of the fibula.  She is able to actively flex and extend her knee and is able to perform a straight leg raise.  She is able to dorsiflex and plantarflex her toes and ankle.  Sensation is intact to light touch to all distributions.  She has fair capillary refill to her left foot.  X-rays:  Recent x-rays of the tibia and fibula are available for review and have been reviewed by myself.  These films demonstrate an essentially nondisplaced fracture at the proximal fibular neck.  No lytic lesions, significant degenerative changes, or other bony abnormalities are identified.  Recent x-rays of the left ankle also are available for review and have been reviewed by myself.  These films demonstrate no evidence of fractures, lytic lesions, or significant degenerative changes.  The mortise remains anatomic.  Assessment: Isolated closed nondisplaced left proximal fibular fracture.  Plan: The treatment options have been discussed with the patient.  This fracture can be managed nonsurgically.  I would suggest applying a standard knee immobilizer to her leg to help provide comfort and stability.  Once stable medically, she may begin to be mobilized with physical therapy, weightbearing as tolerated on the left leg.  Thank you for asking me to participate in the care of this most unfortunate  woman.  I will be happy to follow her with you.   Pascal Lux, MD  Beeper #:  [phone]  06/27/2021 3:48 PM

## 2021-06-28 ENCOUNTER — Ambulatory Visit: Payer: Medicare Other

## 2021-06-28 DIAGNOSIS — K652 Spontaneous bacterial peritonitis: Principal | ICD-10-CM

## 2021-06-28 DIAGNOSIS — R188 Other ascites: Secondary | ICD-10-CM

## 2021-06-28 DIAGNOSIS — I9589 Other hypotension: Secondary | ICD-10-CM

## 2021-06-28 DIAGNOSIS — E861 Hypovolemia: Secondary | ICD-10-CM

## 2021-06-28 LAB — BLOOD CULTURE ID PANEL (REFLEXED) - BCID2

## 2021-06-28 LAB — BPAM RBC
Blood Product Expiration Date: 202302212359
Blood Product Expiration Date: 202302212359
ISSUE DATE / TIME: 202302150922
ISSUE DATE / TIME: 202302151845
Unit Type and Rh: 9500
Unit Type and Rh: 9500

## 2021-06-28 LAB — VITAMIN B12: Vitamin B-12: 614 pg/mL (ref 180–914)

## 2021-06-28 LAB — TYPE AND SCREEN
ABO/RH(D): B POS
Antibody Screen: NEGATIVE
Unit division: 0
Unit division: 0

## 2021-06-28 LAB — BASIC METABOLIC PANEL
Anion gap: 12 (ref 5–15)
BUN: 51 mg/dL — ABNORMAL HIGH (ref 8–23)
CO2: 22 mmol/L (ref 22–32)
Calcium: 8.2 mg/dL — ABNORMAL LOW (ref 8.9–10.3)
Chloride: 104 mmol/L (ref 98–111)
Creatinine, Ser: 2.6 mg/dL — ABNORMAL HIGH (ref 0.44–1.00)
GFR, Estimated: 18 mL/min — ABNORMAL LOW (ref >60)
Glucose, Bld: 126 mg/dL — ABNORMAL HIGH (ref 70–99)
Potassium: 4.1 mmol/L (ref 3.5–5.1)
Sodium: 138 mmol/L (ref 135–145)

## 2021-06-28 LAB — CBC
HCT: 29.4 % — ABNORMAL LOW (ref 36.0–46.0)
Hemoglobin: 9.2 g/dL — ABNORMAL LOW (ref 12.0–15.0)
MCH: 28 pg (ref 26.0–34.0)
MCHC: 31.3 g/dL (ref 30.0–36.0)
MCV: 89.6 fL (ref 80.0–100.0)
Platelets: 174 10*3/uL (ref 150–400)
RBC: 3.28 MIL/uL — ABNORMAL LOW (ref 3.87–5.11)
RDW: 21.2 % — ABNORMAL HIGH (ref 11.5–15.5)
WBC: 8.1 10*3/uL (ref 4.0–10.5)
nRBC: 0.9 % — ABNORMAL HIGH (ref 0.0–0.2)

## 2021-06-28 LAB — MAGNESIUM: Magnesium: 2.4 mg/dL (ref 1.7–2.4)

## 2021-06-28 LAB — PHOSPHORUS: Phosphorus: 6.5 mg/dL — ABNORMAL HIGH (ref 2.5–4.6)

## 2021-06-28 MED ORDER — SALINE SPRAY 0.65 % NA SOLN
1.0000 | NASAL | Status: DC | PRN
  Administered 2021-06-28 – 2021-07-02 (×3): 1 via NASAL
  Filled 2021-06-28: qty 44

## 2021-06-28 MED ORDER — ALBUMIN HUMAN 25 % IV SOLN
25.0000 g | Freq: Every day | INTRAVENOUS | Status: AC
  Administered 2021-06-29 – 2021-07-03 (×5): 25 g via INTRAVENOUS
  Filled 2021-06-28 (×6): qty 100

## 2021-06-28 MED ORDER — ALBUMIN HUMAN 25 % IV SOLN
100.0000 g | Freq: Once | INTRAVENOUS | Status: AC
  Administered 2021-06-28: 100 g via INTRAVENOUS
  Filled 2021-06-28: qty 400

## 2021-06-28 NOTE — Consult Note (Signed)
[place]  Telephone:([phone]) [phone] Fax:(336) [phone]  ID: Heather Peterson OB: 1940/08/09  MR#: [id number]  PYP#:[id number]  [name], MD as PCP - General (Family Medicine) Lloyd Huger, MD as Consulting Physician (Oncology)  CHIEF COMPLAINT: Hypotension, fluid retention, fall with fractured fibula.  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  She is also found to have significant anemia has since received several units of packed red blood cells.  Patient is alert, but somewhat confused.  She has no neurologic complaints.  She denies any fevers.  He has a poor appetite.  She has shortness of breath, but denies chest pain, cough, or hemoptysis.  She has no nausea, vomiting, constipation, or diarrhea.  She has no urinary complaints.  She does not complain of leg pain currently.  Patient offers no further specific complaints today.  REVIEW OF SYSTEMS:   Review of Systems  Constitutional:  Positive for malaise/fatigue. Negative for fever and weight loss.  Respiratory:  Positive for shortness of breath. Negative for cough and hemoptysis.   Cardiovascular:  Positive for leg swelling. Negative for chest pain.  Gastrointestinal:  Positive for abdominal pain. Negative for blood in stool and melena.  Genitourinary: Negative.  Negative for dysuria.  Musculoskeletal:  Positive for falls.  Skin: Negative.  Negative for rash.  Neurological:  Positive for weakness. Negative for dizziness, focal weakness and headaches.  Psychiatric/Behavioral: Negative.  The patient is not nervous/anxious.    As per HPI. Otherwise, a complete review of systems is negative.  PAST MEDICAL HISTORY: Past Medical History:  Diagnosis Date    Anxiety    Chronic combined systolic and diastolic CHF (congestive heart failure) (HCC)    Chronic kidney disease    Coronary artery disease    Depression    Diabetes mellitus without complication (HCC)    Diabetes mellitus, type II ([place])    Hypertension    MI (myocardial infarction) ([place])    x 5   Pacemaker    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[place])    Prolonged Q-T interval on ECG    Thyroid disease     PAST SURGICAL HISTORY: Past Surgical History:  Procedure Laterality Date   CENTRAL LINE INSERTION  03/11/2017   Procedure: CENTRAL LINE INSERTION;  Surgeon: Leonie Man, MD;  Location: [facility];  Service: Cardiovascular;;   CHOLECYSTECTOMY     COLONOSCOPY WITH PROPOFOL N/A 09/01/2019   Procedure: COLONOSCOPY WITH PROPOFOL;  Surgeon: Toledo, Benay Pike, MD;  Location: [facility];  Service: Gastroenterology;  Laterality: N/A;   CORONARY STENT INTERVENTION W/IMPELLA N/A 03/11/2017   Procedure: Coronary Stent Intervention w/Impella;  Surgeon: Leonie Man, MD;  Location: [facility];  Service: Cardiovascular;  Laterality: N/A;   ESOPHAGOGASTRODUODENOSCOPY (EGD) WITH PROPOFOL N/A 09/01/2019   Procedure: ESOPHAGOGASTRODUODENOSCOPY (EGD) WITH PROPOFOL;  Surgeon: Toledo, Benay Pike, MD;  Location: [facility];  Service: Gastroenterology;  Laterality: N/A;   ESOPHAGOGASTRODUODENOSCOPY (EGD) WITH PROPOFOL N/A 09/08/2019   Procedure: ESOPHAGOGASTRODUODENOSCOPY (EGD) WITH PROPOFOL;  Surgeon: [name], MD;  Location: [facility] ENDOSCOPY;  Service: Gastroenterology;  Laterality: N/A;   ESOPHAGOGASTRODUODENOSCOPY (EGD) WITH PROPOFOL N/A 02/15/2021   Procedure: ESOPHAGOGASTRODUODENOSCOPY (EGD) WITH PROPOFOL;  Surgeon: [name], MD;  Location: [facility] ENDOSCOPY;  Service: Gastroenterology;  Laterality: N/A;   EYE  SURGERY     HIP ARTHROPLASTY Right 03/29/2020   Procedure: ARTHROPLASTY BIPOLAR HIP (HEMIARTHROPLASTY);  Surgeon: Corky Mull, MD;  Location: [facility] ORS;  Service:  Orthopedics;  Laterality: Right;   INTRAVASCULAR PRESSURE WIRE/FFR STUDY N/A 03/11/2017   Procedure: INTRAVASCULAR PRESSURE WIRE/FFR STUDY;  Surgeon: Leonie Man, MD;  Location: [facility];  Service: Cardiovascular;  Laterality: N/A;   INTRAVASCULAR ULTRASOUND/IVUS N/A 03/11/2017   Procedure: Intravascular Ultrasound/IVUS;  Surgeon: Leonie Man, MD;  Location: [facility];  Service: Cardiovascular;  Laterality: N/A;   LEFT HEART CATH AND CORONARY ANGIOGRAPHY N/A 03/05/2017   Procedure: LEFT HEART CATH AND CORONARY ANGIOGRAPHY;  Surgeon: Isaias Cowman, MD;  Location: [facility];  Service: Cardiovascular;  Laterality: N/A;   PACEMAKER IMPLANT     pacemaker/defibrillator Left     FAMILY HISTORY: Family History  Problem Relation Age of Onset   Hypertension Father    Heart attack Father    Depression Sister    Depression Brother    Depression Brother     ADVANCED DIRECTIVES (Y/N):  _0 @  HEALTH MAINTENANCE: Social History   Tobacco Use   Smoking status: Every Day    Packs/day: 0.25    Types: E-cigarettes, Cigarettes   Smokeless tobacco: Never  Vaping Use   Vaping Use: Former  Substance Use Topics   Alcohol use: Not Currently    Comment: occasionally   Drug use: Yes    Comment: prescribed pain meds     Colonoscopy:  PAP:  Bone density:  Lipid panel:  Allergies  Allergen Reactions   Celebrex [Celecoxib] Anaphylaxis   Glipizide Anaphylaxis   Levaquin [Levofloxacin In D5w] Other (See Comments)    Heart arrhthymias   Levofloxacin Other (See Comments) and Palpitations    ICD fired   Lisinopril Swelling    Lip and facial swelling   Sulfa Antibiotics Other (See Comments) and Anaphylaxis    Reaction: unknown   Metformin Other (See Comments)    Gi tolerance    Penicillins Rash and Other (See Comments)    Has patient had a PCN reaction causing immediate rash, facial/tongue/throat swelling, SOB or lightheadedness with  hypotension: Unknown Has patient had a PCN reaction causing severe rash involving mucus membranes or skin necrosis: No Has patient had a PCN reaction that required hospitalization: No Has patient had a PCN reaction occurring within the last 10 years: No If all of the above answers are "NO", then may proceed with Cephalosporin use.     Current Facility-Administered Medications  Medication Dose Route Frequency Provider Last Rate Last Admin   0.9 %  sodium chloride infusion  10 mL/hr Intravenous Once Duffy Bruce, MD   Held at 06/27/21 0819   0.9 %  sodium chloride infusion  250 mL Intravenous Continuous Teressa Lower, NP   Stopped at 06/27/21 2000   acetaminophen (TYLENOL) tablet 650 mg  650 mg Oral Q4H PRN Teressa Lower, NP       cefTRIAXone (ROCEPHIN) 2 g in sodium chloride 0.9 % 100 mL IVPB  2 g Intravenous Q24H Duffy Bruce, MD   Stopped at 06/28/21 0930   Chlorhexidine Gluconate Cloth 2 % PADS 6 each  6 each Topical Daily Flora Lipps, MD   6 each at 06/28/21 0853   FLUoxetine (PROZAC) capsule 10 mg  10 mg Oral Daily Teressa Lower, NP       [START ON 06/29/2021] levothyroxine (SYNTHROID) tablet 50 mcg  50 mcg Oral Q48H Kasa,  Maretta Bees, MD       levothyroxine (SYNTHROID) tablet 75 mcg  75 mcg Oral Q48H Kasa, Kurian, MD       lidocaine (LIDODERM) 5 % 1 patch  1 patch Transdermal q1600 Teressa Lower, NP   1 patch at 06/27/21 1825   loratadine (CLARITIN) tablet 10 mg  10 mg Oral Daily Teressa Lower, NP       MEDLINE mouth rinse  15 mL Mouth Rinse BID Flora Lipps, MD   15 mL at 06/28/21 0853   midodrine (PROAMATINE) tablet 10 mg  10 mg Oral TID Teressa Lower, NP   10 mg at 06/28/21 0853   morphine (PF) 2 MG/ML injection 1-2 mg  1-2 mg Intravenous Q4H PRN Teressa Lower, NP   2 mg at 06/28/21 1128   norepinephrine (LEVOPHED) 31m in 2548m(0.016 mg/mL) premix infusion  2-10 mcg/min Intravenous Titrated NeTeressa LowerNP   Stopped at 06/27/21 1901   octreotide (SANDOSTATIN) 500 mcg in  sodium chloride 0.9 % 250 mL (2 mcg/mL) infusion  50 mcg/hr Intravenous Continuous IsDuffy BruceMD 25 mL/hr at 06/28/21 1100 50 mcg/hr at 06/28/21 1100   polyethylene glycol (MIRALAX / GLYCOLAX) packet 17 g  17 g Oral Daily PRN NeTeressa LowerNP       simvastatin (ZOCOR) tablet 20 mg  20 mg Oral QHS NeTeressa LowerNP        OBJECTIVE: Vitals:   06/28/21 1100 06/28/21 1200  BP: (!) 88/55 (!) 83/59  Pulse: 82 81  Resp: 15 17  Temp:  98.7 F (37.1 C)  SpO2: 93% 93%     Body mass index is 31.09 kg/m.    ECOG FS:4 - Bedbound  General: Ill-appearing, no acute distress. Eyes: Pink conjunctiva, anicteric sclera.  Ecchymosis surrounding left eye. HEENT: Normocephalic, moist mucous membranes. Lungs: No audible wheezing or coughing. Heart: Regular rate and rhythm. Abdomen: Mild tenderness, no obvious distention. Musculoskeletal: Left leg in immobilization splint. Neuro: Alert, answering all questions appropriately. Cranial nerves grossly intact. Skin: No rashes or petechiae noted. Psych: Normal affect.   LAB RESULTS:  Lab Results  Component Value Date   NA 138 06/28/2021   K 4.1 06/28/2021   CL 104 06/28/2021   CO2 22 06/28/2021   GLUCOSE 126 (H) 06/28/2021   BUN 51 (H) 06/28/2021   CREATININE 2.60 (H) 06/28/2021   CALCIUM 8.2 (L) 06/28/2021   PROT 6.0 (L) 06/27/2021   ALBUMIN 2.9 (L) 06/27/2021   AST 17 06/27/2021   ALT 8 06/27/2021   ALKPHOS 104 06/27/2021   BILITOT 0.7 06/27/2021   GFRNONAA 18 (L) 06/28/2021   GFRAA 30 (L) 09/09/2019    Lab Results  Component Value Date   WBC 8.1 06/28/2021   NEUTROABS 6.4 06/27/2021   HGB 9.2 (L) 06/28/2021   HCT 29.4 (L) 06/28/2021   MCV 89.6 06/28/2021   PLT 174 06/28/2021     STUDIES: CT ABDOMEN PELVIS WO CONTRAST  Result Date: 06/27/2021 CLINICAL DATA:  Abdominal distension.  Sepsis.  Hypotension. EXAM: CT ABDOMEN AND PELVIS WITHOUT CONTRAST TECHNIQUE: Multidetector CT imaging of the abdomen and pelvis was performed  following the standard protocol without IV contrast. RADIATION DOSE REDUCTION: This exam was performed according to the departmental dose-optimization program which includes automated exposure control, adjustment of the mA and/or kV according to patient size and/or use of iterative reconstruction technique. COMPARISON:  06/18/2020 FINDINGS: Lower chest: Heart is markedly enlarged. Small right pleural effusion noted. Bibasilar atelectasis  evident. Hepatobiliary: Nodular liver contour is compatible with cirrhosis. Gallbladder surgically absent. No intrahepatic or extrahepatic biliary dilation. Pancreas: No focal mass lesion. No dilatation of the main duct. No intraparenchymal cyst. No peripancreatic edema. Spleen: No splenomegaly. No focal mass lesion. Adrenals/Urinary Tract: Stable bilateral adrenal adenomas. Cortical scarring noted lower pole right kidney with punctate nonobstructing stones in the interpolar region. Stable 6 mm stone lower pole left kidney with punctate nonobstructing stone interpolar left kidney. No evidence for hydroureter. Bladder is obscured by beam hardening artifact from right hip replacement but there appears to be of Foley catheter in place in gas in the bladder lumen is compatible with the instrumentation. Stomach/Bowel: Stomach is nondistended. Duodenum is normally positioned as is the ligament of Treitz. No small bowel wall thickening. No small bowel dilatation. No gross colonic mass. No colonic wall thickening. Diverticular changes are noted in the left colon without evidence of diverticulitis. Vascular/Lymphatic: There is advanced atherosclerotic calcification of the abdominal aorta without aneurysm. There is no gastrohepatic or hepatoduodenal ligament lymphadenopathy. No retroperitoneal or mesenteric lymphadenopathy. No pelvic sidewall lymphadenopathy. Reproductive: Inferior pelvis obscured by beam hardening artifact. Lobular soft tissue in the upper central pelvis may be related to  the uterus/ovaries but cannot be definitively characterized. Other: Moderate volume abdominopelvic ascites. Musculoskeletal: Status post right hip replacement No worrisome lytic or sclerotic osseous abnormality. IMPRESSION: 1. No acute findings in the abdomen or pelvis. 2. Nodular liver contour compatible with cirrhosis. 3. Moderate volume abdominopelvic ascites. 4. Stable bilateral adrenal adenomas. 5. Bilateral nonobstructing renal stones. 6. Lobular soft tissue in the upper central pelvis may be related to the uterus/ovaries but cannot be definitively characterized and has been incompletely visualized. Pelvic ultrasound recommended to further evaluate. 7. Cardiomegaly with small right pleural effusion. 8. Aortic Atherosclerosis (ICD10-I70.0). Electronically Signed   By: Misty Stanley M.D.   On: 06/27/2021 16:22   DG Chest 2 View  Result Date: 06/26/2021 CLINICAL DATA:  Shortness of breath EXAM: CHEST - 2 VIEW COMPARISON:  Chest radiograph dated February 12, 2021 FINDINGS: The heart is markedly enlarged. 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 No focal consolidation or pleural effusion. No acute osseous abnormality. IMPRESSION: Cardiomegaly without evidence of focal consolidation or pleural effusion. Electronically Signed   By: Keane Police D.O.   On: 06/26/2021 18:39   DG Tibia/Fibula Left  Result Date: 06/27/2021 CLINICAL DATA:  Shortness of breath.  Status post fall. EXAM: LEFT TIBIA AND FIBULA - 2 VIEW COMPARISON:  None. FINDINGS: There is a fracture deformity involving the proximal metadiaphysis of the fibula. The fracture fragments appear to be in near anatomic alignment. No additional fracture or dislocation identified. IMPRESSION: Acute fracture of the proximal metadiaphysis of the fibula. Electronically Signed   By: Kerby Moors M.D.   On: 06/27/2021 07:56   DG Ankle Complete Left  Result Date: 06/27/2021 CLINICAL DATA:  Multiple falls, ankle injury,  initial encounter. EXAM: LEFT ANKLE COMPLETE - 3+ VIEW COMPARISON:  Left tibia fibula 06/27/2021. FINDINGS: Ankle mortise is intact.  No fracture. IMPRESSION: No acute findings. Electronically Signed   By: Lorin Picket M.D.   On: 06/27/2021 08:50   DG Abd 1 View  Result Date: 06/27/2021 CLINICAL DATA:  Abdominal distension. EXAM: ABDOMEN - 1 VIEW COMPARISON:  CT 02/12/2021.  Radiographs 07/16/2019. FINDINGS: 1330 hours. Two views submitted. The bowel gas pattern appears nonobstructive. There is no supine evidence of bowel wall thickening or free intraperitoneal air. Scattered vascular calcifications are noted. Patient  is status post cholecystectomy and right total hip arthroplasty. IMPRESSION: No radiographic evidence of active abdominal process. No findings to suggest recurrent large volume ascites. Electronically Signed   By: Richardean Sale M.D.   On: 06/27/2021 13:43   CT HEAD WO CONTRAST (5MM)  Result Date: 06/27/2021 CLINICAL DATA:  Head trauma, moderate-severe; Neck trauma (Age >= 65y) EXAM: CT HEAD WITHOUT CONTRAST CT CERVICAL SPINE WITHOUT CONTRAST TECHNIQUE: Multidetector CT imaging of the head and cervical spine was performed following the standard protocol without intravenous contrast. Multiplanar CT image reconstructions of the cervical spine were also generated. RADIATION DOSE REDUCTION: This exam was performed according to the departmental dose-optimization program which includes automated exposure control, adjustment of the mA and/or kV according to patient size and/or use of iterative reconstruction technique. COMPARISON:  February 14, 23. FINDINGS: CT HEAD FINDINGS Brain: No evidence of acute infarction, hemorrhage, hydrocephalus, extra-axial collection or mass lesion/mass effect. Similar chronic right frontal encephalomalacia. Similar additional focal encephalomalacia in the right perirolandic frontoparietal cortex. Similar additional patchy white matter hypoattenuation, nonspecific but  compatible with chronic microvascular ischemic disease. Partially empty sella. Vascular: Calcific intracranial atherosclerosis. No hyperdense vessel identified. Skull: No evidence of acute fracture. Sinuses/Orbits: Opacified anterior left ethmoid air cells. Otherwise, visualized sinuses are clear. Unremarkable orbits. Other: No mastoid effusions.  Cerumen in bilateral EACs. CT CERVICAL SPINE FINDINGS Alignment: Similar mild anterolisthesis of C3 on C4, C4 on C5, and C5 on C6. No new sagittal subluxation. Skull base and vertebrae: Unchanged vertebral body heights. No evidence of acute fracture. Soft tissues and spinal canal: No prevertebral fluid or swelling. No visible canal hematoma. Disc levels:  Similar multilevel degenerative change, mild for age. Upper chest: Interlobular septal thickening in the right greater than left lung apices with possible small layering right pleural effusion and emphysema. IMPRESSION: CT head: 1. No evidence of acute intracranial abnormality. 2. 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 CT cervical spine: 1. No evidence of acute fracture or traumatic malalignment. 2. Partially imaged interlobular septal thickening in the lung apices, compatible with mild interstitial edema. Possible small layering right pleural effusion. Emphysema (ICD10-J43.9). Electronically Signed   By: Margaretha Sheffield M.D.   On: 06/27/2021 08:10   CT Head Wo Contrast  Result Date: 06/26/2021 CLINICAL DATA:  Mental status change, unknown cause; Neck pain, acute, no red flags EXAM: CT HEAD WITHOUT CONTRAST CT CERVICAL SPINE WITHOUT CONTRAST TECHNIQUE: Multidetector CT imaging of the head and cervical spine was performed following the standard protocol without intravenous contrast. Multiplanar CT image reconstructions of the cervical spine were also generated. RADIATION DOSE REDUCTION: This exam was performed according to the departmental dose-optimization program which includes  automated exposure control, adjustment of the mA and/or kV according to patient size and/or use of iterative reconstruction technique. COMPARISON:  CT cervical spine 03/28/2020 FINDINGS: CT HEAD FINDINGS BRAIN: BRAIN Patchy and confluent areas of decreased attenuation are noted throughout the deep and periventricular white matter of the cerebral hemispheres bilaterally, compatible with chronic microvascular ischemic disease. Right frontal lobe encephalomalacia. No evidence of large-territorial acute infarction. No parenchymal hemorrhage. No mass lesion. No extra-axial collection. No mass effect or midline shift. No hydrocephalus. Basilar cisterns are patent. Vascular: No hyperdense vessel. Skull: No acute fracture or focal lesion. Sinuses/Orbits: Paranasal sinuses and mastoid air cells are clear. Bilateral lens replacement. Otherwise the orbits are unremarkable. Other: None. CT CERVICAL SPINE FINDINGS Alignment: Stable grade 1 anterolisthesis of C3 on C4, C4 on C5, C5 on C6. Skull base and vertebrae:  Mild multilevel degenerative changes of the spine. No associated severe osseous central canal or neural foraminal stenosis. No acute fracture. No aggressive appearing focal osseous lesion or focal pathologic process. Soft tissues and spinal canal: No prevertebral fluid or swelling. No visible canal hematoma. Upper chest: Unremarkable. Other: Atherosclerotic plaque of the carotid arteries within the neck. IMPRESSION: 1. No acute intracranial abnormality. 2. No acute displaced fracture or traumatic listhesis of the cervical spine. Electronically Signed   By: Iven Finn M.D.   On: 06/26/2021 20:41   CT Cervical Spine Wo Contrast  Result Date: 06/27/2021 CLINICAL DATA:  Head trauma, moderate-severe; Neck trauma (Age >= 65y) EXAM: CT HEAD WITHOUT CONTRAST CT CERVICAL SPINE WITHOUT CONTRAST TECHNIQUE: Multidetector CT imaging of the head and cervical spine was performed following the standard protocol without  intravenous contrast. Multiplanar CT image reconstructions of the cervical spine were also generated. RADIATION DOSE REDUCTION: This exam was performed according to the departmental dose-optimization program which includes automated exposure control, adjustment of the mA and/or kV according to patient size and/or use of iterative reconstruction technique. COMPARISON:  February 14, 23. FINDINGS: CT HEAD FINDINGS Brain: No evidence of acute infarction, hemorrhage, hydrocephalus, extra-axial collection or mass lesion/mass effect. Similar chronic right frontal encephalomalacia. Similar additional focal encephalomalacia in the right perirolandic frontoparietal cortex. Similar additional patchy white matter hypoattenuation, nonspecific but compatible with chronic microvascular ischemic disease. Partially empty sella. Vascular: Calcific intracranial atherosclerosis. No hyperdense vessel identified. Skull: No evidence of acute fracture. Sinuses/Orbits: Opacified anterior left ethmoid air cells. Otherwise, visualized sinuses are clear. Unremarkable orbits. Other: No mastoid effusions.  Cerumen in bilateral EACs. CT CERVICAL SPINE FINDINGS Alignment: Similar mild anterolisthesis of C3 on C4, C4 on C5, and C5 on C6. No new sagittal subluxation. Skull base and vertebrae: Unchanged vertebral body heights. No evidence of acute fracture. Soft tissues and spinal canal: No prevertebral fluid or swelling. No visible canal hematoma. Disc levels:  Similar multilevel degenerative change, mild for age. Upper chest: Interlobular septal thickening in the right greater than left lung apices with possible small layering right pleural effusion and emphysema. IMPRESSION: CT head: 1. No evidence of acute intracranial abnormality. 2. 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 CT cervical spine: 1. No evidence of acute fracture or traumatic malalignment. 2. Partially imaged interlobular septal thickening in the lung  apices, compatible with mild interstitial edema. Possible small layering right pleural effusion. Emphysema (ICD10-J43.9). Electronically Signed   By: Margaretha Sheffield M.D.   On: 06/27/2021 08:10   CT Cervical Spine Wo Contrast  Result Date: 06/26/2021 CLINICAL DATA:  Mental status change, unknown cause; Neck pain, acute, no red flags EXAM: CT HEAD WITHOUT CONTRAST CT CERVICAL SPINE WITHOUT CONTRAST TECHNIQUE: Multidetector CT imaging of the head and cervical spine was performed following the standard protocol without intravenous contrast. Multiplanar CT image reconstructions of the cervical spine were also generated. RADIATION DOSE REDUCTION: This exam was performed according to the departmental dose-optimization program which includes automated exposure control, adjustment of the mA and/or kV according to patient size and/or use of iterative reconstruction technique. COMPARISON:  CT cervical spine 03/28/2020 FINDINGS: CT HEAD FINDINGS BRAIN: BRAIN Patchy and confluent areas of decreased attenuation are noted throughout the deep and periventricular white matter of the cerebral hemispheres bilaterally, compatible with chronic microvascular ischemic disease. Right frontal lobe encephalomalacia. No evidence of large-territorial acute infarction. No parenchymal hemorrhage. No mass lesion. No extra-axial collection. No mass effect or midline shift. No hydrocephalus. Basilar cisterns are patent. Vascular: No  hyperdense vessel. Skull: No acute fracture or focal lesion. Sinuses/Orbits: Paranasal sinuses and mastoid air cells are clear. Bilateral lens replacement. Otherwise the orbits are unremarkable. Other: None. CT CERVICAL SPINE FINDINGS Alignment: Stable grade 1 anterolisthesis of C3 on C4, C4 on C5, C5 on C6. Skull base and vertebrae: Mild multilevel degenerative changes of the spine. No associated severe osseous central canal or neural foraminal stenosis. No acute fracture. No aggressive appearing focal osseous  lesion or focal pathologic process. Soft tissues and spinal canal: No prevertebral fluid or swelling. No visible canal hematoma. Upper chest: Unremarkable. Other: Atherosclerotic plaque of the carotid arteries within the neck. IMPRESSION: 1. No acute intracranial abnormality. 2. No acute displaced fracture or traumatic listhesis of the cervical spine. Electronically Signed   By: Iven Finn M.D.   On: 06/26/2021 20:41   US Venous Img Lower Bilateral (DVT)  Result Date: 06/27/2021 CLINICAL DATA:  Bilateral lower extremity pain and edema. History of smoking and malignancy. Evaluate for DVT. EXAM: BILATERAL LOWER EXTREMITY VENOUS DOPPLER ULTRASOUND TECHNIQUE: Gray-scale sonography with graded compression, as well as color Doppler and duplex ultrasound were performed to evaluate the lower extremity deep venous systems from the level of the common femoral vein and including the common femoral, femoral, profunda femoral, popliteal and calf veins including the posterior tibial, peroneal and gastrocnemius veins when visible. The superficial great saphenous vein was also interrogated. Spectral Doppler was utilized to evaluate flow at rest and with distal augmentation maneuvers in the common femoral, femoral and popliteal veins. COMPARISON:  None. FINDINGS: RIGHT LOWER EXTREMITY Common Femoral Vein: No evidence of thrombus. Normal compressibility, respiratory phasicity and response to augmentation. Saphenofemoral Junction: No evidence of thrombus. Normal compressibility and flow on color Doppler imaging. Profunda Femoral Vein: No evidence of thrombus. Normal compressibility and flow on color Doppler imaging. Femoral Vein: No evidence of thrombus. Normal compressibility, respiratory phasicity and response to augmentation. Popliteal Vein: No evidence of thrombus. Normal compressibility, respiratory phasicity and response to augmentation. Calf Veins: No evidence of thrombus. Normal compressibility and flow on color  Doppler imaging. Superficial Great Saphenous Vein: No evidence of thrombus. Normal compressibility. Venous Reflux:  None. Other Findings:  None. LEFT LOWER EXTREMITY Common Femoral Vein: No evidence of thrombus. Normal compressibility, respiratory phasicity and response to augmentation. Saphenofemoral Junction: No evidence of thrombus. Normal compressibility and flow on color Doppler imaging. Profunda Femoral Vein: No evidence of thrombus. Normal compressibility and flow on color Doppler imaging. Femoral Vein: No evidence of thrombus. Normal compressibility, respiratory phasicity and response to augmentation. Popliteal Vein: No evidence of thrombus. Normal compressibility, respiratory phasicity and response to augmentation. Calf Veins: No evidence of thrombus. Normal compressibility and flow on color Doppler imaging. Superficial Great Saphenous Vein: No evidence of thrombus. Normal compressibility. Venous Reflux:  None. Other Findings:  None. IMPRESSION: No evidence of DVT within either lower extremity. Electronically Signed   By: Sandi Mariscal M.D.   On: 06/27/2021 15:59   US Paracentesis  Result Date: 06/27/2021 INDICATION: Patient with a history of cirrhosis and recurrent ascites admitted with sepsis. Interventional radiology asked to perform a diagnostic and therapeutic paracentesis. EXAM: ULTRASOUND GUIDED PARACENTESIS MEDICATIONS: 1% lidocaine 10 mL COMPLICATIONS: None immediate. PROCEDURE: Informed written consent was obtained from the patient after a discussion of the risks, benefits and alternatives to treatment. A timeout was performed prior to the initiation of the procedure. Initial ultrasound scanning demonstrates a large amount of ascites within the left lower abdominal quadrant. The left lower abdomen was prepped and draped  in the usual sterile fashion. 1% lidocaine was used for local anesthesia. Procedure was done at the bedside in the ICU due to patient's critical status. Following this, a 19  gauge, 7-cm, Yueh catheter was introduced. An ultrasound image was saved for documentation purposes. The paracentesis was performed. The catheter was removed and a dressing was applied. The patient tolerated the procedure well without immediate post procedural complication. FINDINGS: A total of approximately 3.2 L of clear yellow fluid was removed. Samples were sent to the laboratory as requested by the clinical team. IMPRESSION: Successful ultrasound-guided paracentesis yielding 3.2 liters of peritoneal fluid. Read by: Soyla Dryer, NP Electronically Signed   By: Albin Felling M.D.   On: 06/27/2021 16:12   US Paracentesis  Result Date: 06/14/2021 INDICATION: Recurrent ascites request received for therapeutic paracentesis. EXAM: ULTRASOUND GUIDED  PARACENTESIS MEDICATIONS: Local 1% lidocaine only. COMPLICATIONS: None immediate. PROCEDURE: Informed written consent was obtained from the patient after a discussion of the risks, benefits and alternatives to treatment. A timeout was performed prior to the initiation of the procedure. Initial ultrasound scanning demonstrates a moderate amount of ascites within the right lower abdominal quadrant. The right lower abdomen was prepped and draped in the usual sterile fashion. 1% lidocaine was used for local anesthesia. Following this, a 19 gauge, 7-cm, Yueh catheter was introduced. An ultrasound image was saved for documentation purposes. The paracentesis was performed. The catheter was removed and a dressing was applied. The patient tolerated the procedure well without immediate post procedural complication. FINDINGS: A total of approximately 3.6 L of yellow-colored fluid was removed. IMPRESSION: Successful ultrasound-guided paracentesis yielding 3.6 liters of peritoneal fluid. This exam was performed by Tsosie Billing PA-C, and was supervised and interpreted by Dr. Denna Haggard. Electronically Signed   By: Albin Felling M.D.   On: 06/14/2021 07:48   US  Paracentesis  Result Date: 06/01/2021 INDICATION: Patient history of multiple myeloma chronic renal insufficiency, recurrent ascites. Request to IR for therapeutic paracentesis. EXAM: ULTRASOUND GUIDED THERAPEUTIC PARACENTESIS MEDICATIONS: 8 mL 1% lidocaine COMPLICATIONS: None immediate. PROCEDURE: Informed written consent was obtained from the patient after a discussion of the risks, benefits and alternatives to treatment. A timeout was performed prior to the initiation of the procedure. Initial ultrasound scanning demonstrates a large amount of ascites within the right lower abdominal quadrant. The right lower abdomen was prepped and draped in the usual sterile fashion. 1% lidocaine was used for local anesthesia. Following this, a 19 gauge, 7-cm, Yueh catheter was introduced. An ultrasound image was saved for documentation purposes. The paracentesis was performed. The catheter was removed and a dressing was applied. The patient tolerated the procedure well without immediate post procedural complication. FINDINGS: A total of approximately 4.8 L of hazy yellow fluid was removed. IMPRESSION: Successful ultrasound-guided paracentesis yielding 4.8 liters of peritoneal fluid. Read by Candiss Norse, PA-C Electronically Signed   By: Ruthann Cancer M.D.   On: 06/01/2021 08:19   DG Chest Portable 1 View  Result Date: 06/27/2021 CLINICAL DATA:  Shortness of breath.  Left leg pain.  Fall. EXAM: PORTABLE CHEST 1 VIEW COMPARISON:  06/26/2021 FINDINGS: Left chest wall ICD is noted with leads in the right atrial appendage and right ventricle. Cardiac enlargement. Lung volumes are low. Mild diffuse increase interstitial markings are noted bilaterally concerning for mild edema. No airspace consolidation. Atelectasis is noted within the right base. IMPRESSION: 1. Cardiac enlargement and mild pulmonary edema. 2. Low lung volumes with right lung base atelectasis. Electronically Signed   By: Lovena Le  Clovis Riley M.D.   On:  06/27/2021 07:53    ASSESSMENT:  Hypotension, fluid retention, fall with fractured fibula.  PLAN:    1.  Multiple myeloma: Patient has not received treatment since February 09, 2021.  No further treatments are planned at this time.  Recommend palliative care consult. 2.  Anemia: Likely multifactorial given patient's chronic renal insufficiency, underlying myeloma, and history of GI bleeds.  She has received several units of packed red blood cells with improvement of her hemoglobin to 9.2.  Continue to monitor. 3.  Hypotension: Patient remains in the ICU on pressors. 4.  Fluid retention/ascites: Likely multifactorial given patient's underlying history of heart failure. 5.  Chronic renal insufficiency: Patient creatinine is trended up to 2.6.  Renal function has slowly declined since October 2022. 6.  Fractured fibula: Appreciate orthopedics input.  No surgical intervention needed at this time.  Appreciate consult, will follow.   Lloyd Huger, MD   06/28/2021 1:23 PM

## 2021-06-28 NOTE — TOC Initial Note (Signed)
Transition of Care [facility]) - Initial/Assessment Note    Patient Details  Name: Heather Peterson MRN: [id number] Date of Birth: 1940-07-02  [facility] CM/SW Contact:    Shelbie Hutching, RN Phone Number: 06/28/2021, 4:53 PM  Clinical Narrative:                 Patient admitted to the hospital with blood loss anemia, ascites, hepatorenal syndrome and GI bleed.  Patient seems to be doing better, no longer on pressors.  Patient is on chronic oxygen at home on her baseline rate of 2 liters.  Patient is from home with her husband, she has been walking with a walker but has fallen at home and has a fibula fracture but no surgical intervention required.   Daughter called and is interested in hospice, patient has had [facility] for home health and they recommended hospice.  Daughter contacted Lenna Sciara Been with [facility] and [place] [facility] contacted RNCM.   MD to speak with family today about medical plan.  RNCM will communicate with Melissa once DC plan is set.    Expected Discharge Plan: Home w Hospice Care Barriers to Discharge: Continued Medical Work up   Patient Goals and CMS Choice Patient states their goals for this hospitalization and ongoing recovery are:: Daugher is interested in hospice at home but also wants to know the medical plan update as patient is looking better than she was CMS Medicare.gov Compare Post Acute Care list provided to:: Patient Represenative (must comment) Choice offered to / list presented to : Adult Children  Expected Discharge Plan and Services Expected Discharge Plan: Home w Hospice Care   Discharge Planning Services: CM Consult Post Acute Care Choice: Hospice Living arrangements for the past 2 months: Single Family Home                 DME Arranged: N/A DME Agency: NA       HH Arranged: NA HH Agency: NA        Prior Living Arrangements/Services Living arrangements for the past 2 months: Single Family Home Lives with:: Spouse, Adult  Children Patient language and need for interpreter reviewed:: Yes Do you feel safe going back to the place where you live?: Yes      Need for Family Participation in Patient Care: Yes (Comment) Care giver support system in place?: Yes (comment) (husband, son, daughter) Current home services: DME [facility], hospital like bed, oxygen) Criminal Activity/Legal Involvement Pertinent to Current Situation/Hospitalization: No - Comment as needed  Activities of Daily Living      Permission Sought/Granted Permission sought to share information with : Case Manager, Family Supports, Other (comment) Permission granted to share information with : Yes, Verbal Permission Granted  Share Information with NAME: Bobbe Medico  Permission granted to share info w AGENCY: [place] granted to share info w Relationship: daughter  Permission granted to share info w Contact Information: [phone]  Emotional Assessment Appearance:: Appears stated age Attitude/Demeanor/Rapport: Engaged Affect (typically observed): Accepting Orientation: : Oriented to Self, Oriented to Place, Oriented to  Time, Oriented to Situation Alcohol / Substance Use: Not Applicable Psych Involvement: No (comment)  Admission diagnosis:  Hepatorenal syndrome ([place]) [K76.7] Blood loss anemia [D50.0] Ascites [R18.8] Hypotension [I95.9] Hypotension, unspecified hypotension type [I95.9] Gastrointestinal hemorrhage, unspecified gastrointestinal hemorrhage type [K92.2] Patient Active Problem List   Diagnosis Date Noted   Hypotension 06/27/2021   Symptomatic anemia 02/12/2021   Leukocytosis 02/12/2021   Lactic acid acidosis 02/12/2021   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   Hospital discharge follow-up 08/31/2020   Difficulty sleeping 07/25/2020   Difficulty walking 07/25/2020   H/O subarachnoid hemorrhage 07/25/2020   Nausea 07/25/2020   Moderate protein-calorie malnutrition (HCC) 07/04/2020    Subdural hematoma 06/26/2020   Hyperkalemia 06/19/2020   Chronic respiratory failure ([place]) 06/19/2020   Hypokalemia    Fall    Troponin I above reference range    Status post hip hemiarthroplasty    Closed nondisplaced fracture of greater trochanter of right femur ([place]) 03/30/2020   Closed displaced midcervical fracture of right femur ([place]) 03/30/2020   Hip fracture ([place]) 03/28/2020   Multiple myeloma ([place]) 03/01/2020   Goals of care, counseling/discussion 03/01/2020   Osteomyelitis ([place]) 01/20/2020   Pain in finger of left hand 01/20/2020   Gout 10/27/2019   Acute blood loss anemia    Gastrointestinal hemorrhage    UGI bleed 09/04/2019   Drug-induced constipation 07/26/2019   Insomnia due to medical condition 07/26/2019   Benign hypertensive kidney disease with chronic kidney disease 07/20/2019   Proteinuria 07/20/2019   Hyposmolality and/or hyponatremia 07/20/2019   Dehydration    Anemia in chronic kidney disease (CKD) 07/04/2019   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   Chronic respiratory failure with hypoxia ([place]) 07/03/2019   Hyponatremia 07/03/2019   Prolonged Q-T interval on ECG    Weakness    Anemia    MDD (major depressive disorder), recurrent episode, 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   Tobacco use disorder 12/14/2018   At risk for prolonged QT interval syndrome 12/14/2018   Hypothyroidism, acquired, 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   Pedal edema 02/19/2018   SOB (shortness of breath) on exertion 02/19/2018   Left main coronary artery disease 03/07/2017   Drug-induced torsades de pointes 03/06/2017   Ventricular tachycardia 03/05/2017   Defibrillator discharge 98/26/4158   Chronic systolic CHF (congestive heart failure) ([place]) 03/04/2017   COPD (chronic obstructive pulmonary disease) ([place]) 02/10/2017   Diabetes mellitus type 2, uncomplicated ([place]) 30/94/0768   H/O ventricular fibrillation 02/10/2017   Hyperlipidemia 02/10/2017   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/10/2017   Ischemic cardiomyopathy 02/10/2017   Myocardial infarction ([place]) 02/10/2017   Moderate mitral regurgitation 02/10/2017   Syncope 02/04/2017   CAD (coronary artery disease) 02/04/2017   HTN (hypertension) 02/04/2017   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/2017   PCP:  [name], MD Pharmacy:   [facility] [phone] [address], [address] [place] [address] Phone: [phone] Fax: [phone]  [facility], [place] - [address] [place] [address] Phone: [phone] Fax: [phone]     Social Determinants of Health (SDOH) Interventions    Readmission Risk Interventions Readmission Risk Prevention Plan 06/28/2021 02/20/2021 02/16/2021  Transportation Screening Complete Complete Complete  Medication Review Press photographer) Complete Complete Complete  PCP or Specialist appointment within 3-5 days of discharge Complete - Complete  HRI or [place] Complete Complete Complete  SW Recovery Care/Counseling Consult Complete Complete Complete  Palliative Care Screening Complete Not Applicable Not [place] Patient Refused Patient Refused Complete  Some recent data might be hidden

## 2021-06-28 NOTE — Progress Notes (Incomplete Revision)
[name] , MD [address], [place], [place], [place], [address] [address], [place], [place], [place], [address] Phone: [phone]  Fax: [phone]   Heather Peterson is being followed for chronic liver disease, GI bleed day 1 of follow up   Subjective: She is alert and awake which is better than yesterday but still confused.   Objective: Vital signs in last 24 hours: Vitals:   06/28/21 0900 06/28/21 1000 06/28/21 1100 06/28/21 1200  BP: (!) 98/58 95/62 (!) 88/55 (!) 83/59  Pulse: 81 84 82 81  Resp: 12 14 15 17   Temp:    98.7 F (37.1 C)  TempSrc:    Oral  SpO2: 96% 94% 93% 93%  Weight:      Height:       Weight change:   Intake/Output Summary (Last 24 hours) at 06/28/2021 1345 Last data filed at 06/28/2021 1100 Gross per 24 hour  Intake 1118.46 ml  Output 445 ml  Net 673.46 ml     Exam: Heart:: Regular rate and rhythm Lungs: Decreased air entry bilaterally no added breath sounds Abdomen: General distention no tenderness no guarding no rigidity. Neurology: Alert and awake but oriented only x1 appears confused   Lab Results: @LABTEST2 @ Micro Results: Recent Results (from the past 240 hour(s))  Resp Panel by RT-PCR (Flu A&B, Covid) Nasopharyngeal Swab     Status: None   Collection Time: 06/27/21  7:55 AM   Specimen: Nasopharyngeal Swab; Nasopharyngeal(NP) swabs in vial transport medium  Result Value Ref Range Status   SARS Coronavirus 2 by RT PCR NEGATIVE NEGATIVE Final    Comment: (NOTE) SARS-CoV-2 target nucleic acids are NOT DETECTED.  The SARS-CoV-2 RNA is generally detectable in upper respiratory specimens during the acute phase of infection. The lowest concentration of SARS-CoV-2 viral copies this assay can detect is 138 copies/mL. A negative result does not preclude SARS-Cov-2 infection and should not be used as the sole basis for treatment or other patient management decisions. A negative result may occur with  improper specimen  collection/handling, submission of specimen other than nasopharyngeal swab, presence of viral mutation(s) within the areas targeted by this assay, and inadequate number of viral copies(<138 copies/mL). A negative result must be combined with clinical observations, patient history, and epidemiological information. The expected result is Negative.  Fact Sheet for Patients:  EntrepreneurPulse.com.au  Fact Sheet for Healthcare Providers:  IncredibleEmployment.be  This test is no t yet approved or cleared by the [place] FDA and  has been authorized for detection and/or diagnosis of SARS-CoV-2 by FDA under an Emergency Use Authorization (EUA). This EUA will remain  in effect (meaning this test can be used) for the duration of the COVID-19 declaration under Section 564(b)(1) of the Act, 21 U.S.C.section 360bbb-3(b)(1), unless the authorization is terminated  or revoked sooner.       Influenza A by PCR NEGATIVE NEGATIVE Final   Influenza B by PCR NEGATIVE NEGATIVE Final    Comment: (NOTE) The Xpert Xpress SARS-CoV-2/FLU/RSV plus assay is intended as an aid in the diagnosis of influenza from Nasopharyngeal swab specimens and should not be used as a sole basis for treatment. Nasal washings and aspirates are unacceptable for Xpert Xpress SARS-CoV-2/FLU/RSV testing.  Fact Sheet for Patients: EntrepreneurPulse.com.au  Fact Sheet for Healthcare Providers: IncredibleEmployment.be  This test is not yet approved or cleared by the [place] FDA and has been authorized for detection and/or diagnosis of SARS-CoV-2 by FDA under an Emergency Use Authorization (EUA). This  EUA will remain in effect (meaning this test can be used) for the duration of the COVID-19 declaration under Section 564(b)(1) of the Act, 21 U.S.C. section 360bbb-3(b)(1), unless the authorization is terminated or revoked.  Performed at [facility], [address] [place], [place] [address]   Blood culture (single)     Status: None (Preliminary result)   Collection Time: 06/27/21  7:55 AM   Specimen: BLOOD  Result Value Ref Range Status   Specimen Description BLOOD BLOOD RIGHT FOREARM  Final   Special Requests   Final    BOTTLES DRAWN AEROBIC AND ANAEROBIC Blood Culture results may not be optimal due to an excessive volume of blood received in culture bottles   Culture   Final    NO GROWTH 1 DAY Performed at [facility], [address], [place], [place] [address]    Report Status PENDING  Incomplete  Blood culture (single)     Status: None (Preliminary result)   Collection Time: 06/27/21 10:08 AM   Specimen: BLOOD LEFT HAND  Result Value Ref Range Status   Specimen Description BLOOD LEFT HAND  Final   Special Requests   Final    BOTTLES DRAWN AEROBIC AND ANAEROBIC Blood Culture adequate volume   Culture  Setup Time   Final    Organism ID to follow GRAM POSITIVE COCCI ANAEROBIC BOTTLE ONLY CRITICAL RESULT CALLED TO, READ BACK BY AND VERIFIED WITH: Performed at [facility], [address] [place], [place] [address]    Culture GRAM POSITIVE COCCI  Final   Report Status PENDING  Incomplete  Blood Culture ID Panel (Reflexed)     Status: Abnormal   Collection Time: 06/27/21 10:08 AM  Result Value Ref Range Status   Enterococcus faecalis NOT DETECTED NOT DETECTED Final   Enterococcus Faecium NOT DETECTED NOT DETECTED Final   Listeria monocytogenes NOT DETECTED NOT DETECTED Final   Staphylococcus species NOT DETECTED NOT DETECTED Final   Staphylococcus aureus (BCID) NOT DETECTED NOT DETECTED Final   Staphylococcus epidermidis NOT DETECTED NOT DETECTED Final   Staphylococcus lugdunensis NOT DETECTED NOT DETECTED Final   Streptococcus species DETECTED (A) NOT DETECTED Final    Comment: Not Enterococcus species, Streptococcus agalactiae, Streptococcus pyogenes, or Streptococcus  pneumoniae. CRITICAL RESULT CALLED TO, READ BACK BY AND VERIFIED WITH: KRISTIN MILLER 06/28/21 0741 MW    Streptococcus agalactiae NOT DETECTED NOT DETECTED Final   Streptococcus pneumoniae NOT DETECTED NOT DETECTED Final   Streptococcus pyogenes NOT DETECTED NOT DETECTED Final   A.calcoaceticus-baumannii NOT DETECTED NOT DETECTED Final   Bacteroides fragilis NOT DETECTED NOT DETECTED Final   Enterobacterales NOT DETECTED NOT DETECTED Final   Enterobacter cloacae complex NOT DETECTED NOT DETECTED Final   Escherichia coli NOT DETECTED NOT DETECTED Final   Klebsiella aerogenes NOT DETECTED NOT DETECTED Final   Klebsiella oxytoca NOT DETECTED NOT DETECTED Final   Klebsiella pneumoniae NOT DETECTED NOT DETECTED Final   Proteus species NOT DETECTED NOT DETECTED Final   Salmonella species NOT DETECTED NOT DETECTED Final   Serratia marcescens NOT DETECTED NOT DETECTED Final   Haemophilus influenzae NOT DETECTED NOT DETECTED Final   Neisseria meningitidis NOT DETECTED NOT DETECTED Final   Pseudomonas aeruginosa NOT DETECTED NOT DETECTED Final   Stenotrophomonas maltophilia NOT DETECTED NOT DETECTED Final   Candida albicans NOT DETECTED NOT DETECTED Final   Candida auris NOT DETECTED NOT DETECTED Final   Candida glabrata NOT DETECTED NOT DETECTED Final   Candida krusei NOT DETECTED NOT DETECTED  Final   Candida parapsilosis NOT DETECTED NOT DETECTED Final   Candida tropicalis NOT DETECTED NOT DETECTED Final   Cryptococcus neoformans/gattii NOT DETECTED NOT DETECTED Final    Comment: Performed at [facility], [address] [place], [place] [address]  MRSA Next Gen by PCR, Nasal     Status: None   Collection Time: 06/27/21 11:30 AM   Specimen: Nasal Mucosa; Nasal Swab  Result Value Ref Range Status   MRSA by PCR Next Gen NOT DETECTED NOT DETECTED Final    Comment: (NOTE) The GeneXpert MRSA Assay (FDA approved for NASAL specimens only), is one component of a comprehensive MRSA  colonization surveillance program. It is not intended to diagnose MRSA infection nor to guide or monitor treatment for MRSA infections. Test performance is not FDA approved in patients less than 26 years old. Performed at [facility], [address] [place], [place] [address]   Body fluid culture w Gram Stain     Status: None (Preliminary result)   Collection Time: 06/27/21  3:00 PM   Specimen: PATH Cytology Peritoneal fluid  Result Value Ref Range Status   Specimen Description   Final    PERITONEAL Performed at [facility], [address], [place], [place] [address]    Special Requests   Final    NONE Performed at [facility], [address] [place], [place] [address]    Gram Stain   Final    RARE WBC PRESENT,BOTH PMN AND MONONUCLEAR NO ORGANISMS SEEN    Culture   Final    NO GROWTH < 24 HOURS Performed at [facility], [place] [address], [place],  [address]    Report Status PENDING  Incomplete   Studies/Results: CT ABDOMEN PELVIS WO CONTRAST  Result Date: 06/27/2021 CLINICAL DATA:  Abdominal distension.  Sepsis.  Hypotension. EXAM: CT ABDOMEN AND PELVIS WITHOUT CONTRAST TECHNIQUE: Multidetector CT imaging of the abdomen and pelvis was performed following the standard protocol without IV contrast. RADIATION DOSE REDUCTION: This exam was performed according to the departmental dose-optimization program which includes automated exposure control, adjustment of the mA and/or kV according to patient size and/or use of iterative reconstruction technique. COMPARISON:  06/18/2020 FINDINGS: Lower chest: Heart is markedly enlarged. Small right pleural effusion noted. Bibasilar atelectasis evident. Hepatobiliary: Nodular liver contour is compatible with cirrhosis. Gallbladder surgically absent. No intrahepatic or extrahepatic biliary dilation. Pancreas: No focal mass lesion. No dilatation of the main duct. No intraparenchymal cyst. No peripancreatic  edema. Spleen: No splenomegaly. No focal mass lesion. Adrenals/Urinary Tract: Stable bilateral adrenal adenomas. Cortical scarring noted lower pole right kidney with punctate nonobstructing stones in the interpolar region. Stable 6 mm stone lower pole left kidney with punctate nonobstructing stone interpolar left kidney. No evidence for hydroureter. Bladder is obscured by beam hardening artifact from right hip replacement but there appears to be of Foley catheter in place in gas in the bladder lumen is compatible with the instrumentation. Stomach/Bowel: Stomach is nondistended. Duodenum is normally positioned as is the ligament of Treitz. No small bowel wall thickening. No small bowel dilatation. No gross colonic mass. No colonic wall thickening. Diverticular changes are noted in the left colon without evidence of diverticulitis. Vascular/Lymphatic: There is advanced atherosclerotic calcification of the abdominal aorta without aneurysm. There is no gastrohepatic or hepatoduodenal ligament lymphadenopathy. No retroperitoneal or mesenteric lymphadenopathy. No pelvic sidewall lymphadenopathy. Reproductive: Inferior pelvis obscured by beam hardening artifact. Lobular soft tissue in the upper central pelvis may be related  to the uterus/ovaries but cannot be definitively characterized. Other: Moderate volume abdominopelvic ascites. Musculoskeletal: Status post right hip replacement No worrisome lytic or sclerotic osseous abnormality. IMPRESSION: 1. No acute findings in the abdomen or pelvis. 2. Nodular liver contour compatible with cirrhosis. 3. Moderate volume abdominopelvic ascites. 4. Stable bilateral adrenal adenomas. 5. Bilateral nonobstructing renal stones. 6. Lobular soft tissue in the upper central pelvis may be related to the uterus/ovaries but cannot be definitively characterized and has been incompletely visualized. Pelvic ultrasound recommended to further evaluate. 7. Cardiomegaly with small right pleural  effusion. 8. Aortic Atherosclerosis (ICD10-I70.0). Electronically Signed   By: Misty Stanley M.D.   On: 06/27/2021 16:22   DG Chest 2 View  Result Date: 06/26/2021 CLINICAL DATA:  Shortness of breath EXAM: CHEST - 2 VIEW COMPARISON:  Chest radiograph dated February 12, 2021 FINDINGS: The heart is markedly enlarged. 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 No focal consolidation or pleural effusion. No acute osseous abnormality. IMPRESSION: Cardiomegaly without evidence of focal consolidation or pleural effusion. Electronically Signed   By: Keane Police D.O.   On: 06/26/2021 18:39   DG Tibia/Fibula Left  Result Date: 06/27/2021 CLINICAL DATA:  Shortness of breath.  Status post fall. EXAM: LEFT TIBIA AND FIBULA - 2 VIEW COMPARISON:  None. FINDINGS: There is a fracture deformity involving the proximal metadiaphysis of the fibula. The fracture fragments appear to be in near anatomic alignment. No additional fracture or dislocation identified. IMPRESSION: Acute fracture of the proximal metadiaphysis of the fibula. Electronically Signed   By: Kerby Moors M.D.   On: 06/27/2021 07:56   DG Ankle Complete Left  Result Date: 06/27/2021 CLINICAL DATA:  Multiple falls, ankle injury, initial encounter. EXAM: LEFT ANKLE COMPLETE - 3+ VIEW COMPARISON:  Left tibia fibula 06/27/2021. FINDINGS: Ankle mortise is intact.  No fracture. IMPRESSION: No acute findings. Electronically Signed   By: Lorin Picket M.D.   On: 06/27/2021 08:50   DG Abd 1 View  Result Date: 06/27/2021 CLINICAL DATA:  Abdominal distension. EXAM: ABDOMEN - 1 VIEW COMPARISON:  CT 02/12/2021.  Radiographs 07/16/2019. FINDINGS: 1330 hours. Two views submitted. The bowel gas pattern appears nonobstructive. There is no supine evidence of bowel wall thickening or free intraperitoneal air. Scattered vascular calcifications are noted. Patient is status post cholecystectomy and right total hip arthroplasty.  IMPRESSION: No radiographic evidence of active abdominal process. No findings to suggest recurrent large volume ascites. Electronically Signed   By: Richardean Sale M.D.   On: 06/27/2021 13:43   CT HEAD WO CONTRAST (5MM)  Result Date: 06/27/2021 CLINICAL DATA:  Head trauma, moderate-severe; Neck trauma (Age >= 65y) EXAM: CT HEAD WITHOUT CONTRAST CT CERVICAL SPINE WITHOUT CONTRAST TECHNIQUE: Multidetector CT imaging of the head and cervical spine was performed following the standard protocol without intravenous contrast. Multiplanar CT image reconstructions of the cervical spine were also generated. RADIATION DOSE REDUCTION: This exam was performed according to the departmental dose-optimization program which includes automated exposure control, adjustment of the mA and/or kV according to patient size and/or use of iterative reconstruction technique. COMPARISON:  February 14, 23. FINDINGS: CT HEAD FINDINGS Brain: No evidence of acute infarction, hemorrhage, hydrocephalus, extra-axial collection or mass lesion/mass effect. Similar chronic right frontal encephalomalacia. Similar additional focal encephalomalacia in the right perirolandic frontoparietal cortex. Similar additional patchy white matter hypoattenuation, nonspecific but compatible with chronic microvascular ischemic disease. Partially empty sella. Vascular: Calcific intracranial atherosclerosis. No hyperdense vessel identified. Skull: No evidence of acute fracture. Sinuses/Orbits:  Opacified anterior left ethmoid air cells. Otherwise, visualized sinuses are clear. Unremarkable orbits. Other: No mastoid effusions.  Cerumen in bilateral EACs. CT CERVICAL SPINE FINDINGS Alignment: Similar mild anterolisthesis of C3 on C4, C4 on C5, and C5 on C6. No new sagittal subluxation. Skull base and vertebrae: Unchanged vertebral body heights. No evidence of acute fracture. Soft tissues and spinal canal: No prevertebral fluid or swelling. No visible canal hematoma.  Disc levels:  Similar multilevel degenerative change, mild for age. Upper chest: Interlobular septal thickening in the right greater than left lung apices with possible small layering right pleural effusion and emphysema. IMPRESSION: CT head: 1. No evidence of acute intracranial abnormality. 2. 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 CT cervical spine: 1. No evidence of acute fracture or traumatic malalignment. 2. Partially imaged interlobular septal thickening in the lung apices, compatible with mild interstitial edema. Possible small layering right pleural effusion. Emphysema (ICD10-J43.9). Electronically Signed   By: Margaretha Sheffield M.D.   On: 06/27/2021 08:10   CT Head Wo Contrast  Result Date: 06/26/2021 CLINICAL DATA:  Mental status change, unknown cause; Neck pain, acute, no red flags EXAM: CT HEAD WITHOUT CONTRAST CT CERVICAL SPINE WITHOUT CONTRAST TECHNIQUE: Multidetector CT imaging of the head and cervical spine was performed following the standard protocol without intravenous contrast. Multiplanar CT image reconstructions of the cervical spine were also generated. RADIATION DOSE REDUCTION: This exam was performed according to the departmental dose-optimization program which includes automated exposure control, adjustment of the mA and/or kV according to patient size and/or use of iterative reconstruction technique. COMPARISON:  CT cervical spine 03/28/2020 FINDINGS: CT HEAD FINDINGS BRAIN: BRAIN Patchy and confluent areas of decreased attenuation are noted throughout the deep and periventricular white matter of the cerebral hemispheres bilaterally, compatible with chronic microvascular ischemic disease. Right frontal lobe encephalomalacia. No evidence of large-territorial acute infarction. No parenchymal hemorrhage. No mass lesion. No extra-axial collection. No mass effect or midline shift. No hydrocephalus. Basilar cisterns are patent. Vascular: No hyperdense vessel.  Skull: No acute fracture or focal lesion. Sinuses/Orbits: Paranasal sinuses and mastoid air cells are clear. Bilateral lens replacement. Otherwise the orbits are unremarkable. Other: None. CT CERVICAL SPINE FINDINGS Alignment: Stable grade 1 anterolisthesis of C3 on C4, C4 on C5, C5 on C6. Skull base and vertebrae: Mild multilevel degenerative changes of the spine. No associated severe osseous central canal or neural foraminal stenosis. No acute fracture. No aggressive appearing focal osseous lesion or focal pathologic process. Soft tissues and spinal canal: No prevertebral fluid or swelling. No visible canal hematoma. Upper chest: Unremarkable. Other: Atherosclerotic plaque of the carotid arteries within the neck. IMPRESSION: 1. No acute intracranial abnormality. 2. No acute displaced fracture or traumatic listhesis of the cervical spine. Electronically Signed   By: Iven Finn M.D.   On: 06/26/2021 20:41   CT Cervical Spine Wo Contrast  Result Date: 06/27/2021 CLINICAL DATA:  Head trauma, moderate-severe; Neck trauma (Age >= 65y) EXAM: CT HEAD WITHOUT CONTRAST CT CERVICAL SPINE WITHOUT CONTRAST TECHNIQUE: Multidetector CT imaging of the head and cervical spine was performed following the standard protocol without intravenous contrast. Multiplanar CT image reconstructions of the cervical spine were also generated. RADIATION DOSE REDUCTION: This exam was performed according to the departmental dose-optimization program which includes automated exposure control, adjustment of the mA and/or kV according to patient size and/or use of iterative reconstruction technique. COMPARISON:  February 14, 23. FINDINGS: CT HEAD FINDINGS Brain: No evidence of acute infarction, hemorrhage, hydrocephalus, extra-axial collection or  mass lesion/mass effect. Similar chronic right frontal encephalomalacia. Similar additional focal encephalomalacia in the right perirolandic frontoparietal cortex. Similar additional patchy white  matter hypoattenuation, nonspecific but compatible with chronic microvascular ischemic disease. Partially empty sella. Vascular: Calcific intracranial atherosclerosis. No hyperdense vessel identified. Skull: No evidence of acute fracture. Sinuses/Orbits: Opacified anterior left ethmoid air cells. Otherwise, visualized sinuses are clear. Unremarkable orbits. Other: No mastoid effusions.  Cerumen in bilateral EACs. CT CERVICAL SPINE FINDINGS Alignment: Similar mild anterolisthesis of C3 on C4, C4 on C5, and C5 on C6. No new sagittal subluxation. Skull base and vertebrae: Unchanged vertebral body heights. No evidence of acute fracture. Soft tissues and spinal canal: No prevertebral fluid or swelling. No visible canal hematoma. Disc levels:  Similar multilevel degenerative change, mild for age. Upper chest: Interlobular septal thickening in the right greater than left lung apices with possible small layering right pleural effusion and emphysema. IMPRESSION: CT head: 1. No evidence of acute intracranial abnormality. 2. 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 CT cervical spine: 1. No evidence of acute fracture or traumatic malalignment. 2. Partially imaged interlobular septal thickening in the lung apices, compatible with mild interstitial edema. Possible small layering right pleural effusion. Emphysema (ICD10-J43.9). Electronically Signed   By: Margaretha Sheffield M.D.   On: 06/27/2021 08:10   CT Cervical Spine Wo Contrast  Result Date: 06/26/2021 CLINICAL DATA:  Mental status change, unknown cause; Neck pain, acute, no red flags EXAM: CT HEAD WITHOUT CONTRAST CT CERVICAL SPINE WITHOUT CONTRAST TECHNIQUE: Multidetector CT imaging of the head and cervical spine was performed following the standard protocol without intravenous contrast. Multiplanar CT image reconstructions of the cervical spine were also generated. RADIATION DOSE REDUCTION: This exam was performed according to the departmental  dose-optimization program which includes automated exposure control, adjustment of the mA and/or kV according to patient size and/or use of iterative reconstruction technique. COMPARISON:  CT cervical spine 03/28/2020 FINDINGS: CT HEAD FINDINGS BRAIN: BRAIN Patchy and confluent areas of decreased attenuation are noted throughout the deep and periventricular white matter of the cerebral hemispheres bilaterally, compatible with chronic microvascular ischemic disease. Right frontal lobe encephalomalacia. No evidence of large-territorial acute infarction. No parenchymal hemorrhage. No mass lesion. No extra-axial collection. No mass effect or midline shift. No hydrocephalus. Basilar cisterns are patent. Vascular: No hyperdense vessel. Skull: No acute fracture or focal lesion. Sinuses/Orbits: Paranasal sinuses and mastoid air cells are clear. Bilateral lens replacement. Otherwise the orbits are unremarkable. Other: None. CT CERVICAL SPINE FINDINGS Alignment: Stable grade 1 anterolisthesis of C3 on C4, C4 on C5, C5 on C6. Skull base and vertebrae: Mild multilevel degenerative changes of the spine. No associated severe osseous central canal or neural foraminal stenosis. No acute fracture. No aggressive appearing focal osseous lesion or focal pathologic process. Soft tissues and spinal canal: No prevertebral fluid or swelling. No visible canal hematoma. Upper chest: Unremarkable. Other: Atherosclerotic plaque of the carotid arteries within the neck. IMPRESSION: 1. No acute intracranial abnormality. 2. No acute displaced fracture or traumatic listhesis of the cervical spine. Electronically Signed   By: Iven Finn M.D.   On: 06/26/2021 20:41   US Venous Img Lower Bilateral (DVT)  Result Date: 06/27/2021 CLINICAL DATA:  Bilateral lower extremity pain and edema. History of smoking and malignancy. Evaluate for DVT. EXAM: BILATERAL LOWER EXTREMITY VENOUS DOPPLER ULTRASOUND TECHNIQUE: Gray-scale sonography with graded  compression, as well as color Doppler and duplex ultrasound were performed to evaluate the lower extremity deep venous systems from the level of  the common femoral vein and including the common femoral, femoral, profunda femoral, popliteal and calf veins including the posterior tibial, peroneal and gastrocnemius veins when visible. The superficial great saphenous vein was also interrogated. Spectral Doppler was utilized to evaluate flow at rest and with distal augmentation maneuvers in the common femoral, femoral and popliteal veins. COMPARISON:  None. FINDINGS: RIGHT LOWER EXTREMITY Common Femoral Vein: No evidence of thrombus. Normal compressibility, respiratory phasicity and response to augmentation. Saphenofemoral Junction: No evidence of thrombus. Normal compressibility and flow on color Doppler imaging. Profunda Femoral Vein: No evidence of thrombus. Normal compressibility and flow on color Doppler imaging. Femoral Vein: No evidence of thrombus. Normal compressibility, respiratory phasicity and response to augmentation. Popliteal Vein: No evidence of thrombus. Normal compressibility, respiratory phasicity and response to augmentation. Calf Veins: No evidence of thrombus. Normal compressibility and flow on color Doppler imaging. Superficial Great Saphenous Vein: No evidence of thrombus. Normal compressibility. Venous Reflux:  None. Other Findings:  None. LEFT LOWER EXTREMITY Common Femoral Vein: No evidence of thrombus. Normal compressibility, respiratory phasicity and response to augmentation. Saphenofemoral Junction: No evidence of thrombus. Normal compressibility and flow on color Doppler imaging. Profunda Femoral Vein: No evidence of thrombus. Normal compressibility and flow on color Doppler imaging. Femoral Vein: No evidence of thrombus. Normal compressibility, respiratory phasicity and response to augmentation. Popliteal Vein: No evidence of thrombus. Normal compressibility, respiratory phasicity and  response to augmentation. Calf Veins: No evidence of thrombus. Normal compressibility and flow on color Doppler imaging. Superficial Great Saphenous Vein: No evidence of thrombus. Normal compressibility. Venous Reflux:  None. Other Findings:  None. IMPRESSION: No evidence of DVT within either lower extremity. Electronically Signed   By: Sandi Mariscal M.D.   On: 06/27/2021 15:59   US Paracentesis  Result Date: 06/27/2021 INDICATION: Patient with a history of cirrhosis and recurrent ascites admitted with sepsis. Interventional radiology asked to perform a diagnostic and therapeutic paracentesis. EXAM: ULTRASOUND GUIDED PARACENTESIS MEDICATIONS: 1% lidocaine 10 mL COMPLICATIONS: None immediate. PROCEDURE: Informed written consent was obtained from the patient after a discussion of the risks, benefits and alternatives to treatment. A timeout was performed prior to the initiation of the procedure. Initial ultrasound scanning demonstrates a large amount of ascites within the left lower abdominal quadrant. The left lower abdomen was prepped and draped in the usual sterile fashion. 1% lidocaine was used for local anesthesia. Procedure was done at the bedside in the ICU due to patient's critical status. Following this, a 19 gauge, 7-cm, Yueh catheter was introduced. An ultrasound image was saved for documentation purposes. The paracentesis was performed. The catheter was removed and a dressing was applied. The patient tolerated the procedure well without immediate post procedural complication. FINDINGS: A total of approximately 3.2 L of clear yellow fluid was removed. Samples were sent to the laboratory as requested by the clinical team. IMPRESSION: Successful ultrasound-guided paracentesis yielding 3.2 liters of peritoneal fluid. Read by: Soyla Dryer, NP Electronically Signed   By: Albin Felling M.D.   On: 06/27/2021 16:12   DG Chest Portable 1 View  Result Date: 06/27/2021 CLINICAL DATA:  Shortness of breath.   Left leg pain.  Fall. EXAM: PORTABLE CHEST 1 VIEW COMPARISON:  06/26/2021 FINDINGS: Left chest wall ICD is noted with leads in the right atrial appendage and right ventricle. Cardiac enlargement. Lung volumes are low. Mild diffuse increase interstitial markings are noted bilaterally concerning for mild edema. No airspace consolidation. Atelectasis is noted within the right base. IMPRESSION: 1. Cardiac enlargement and mild  pulmonary edema. 2. Low lung volumes with right lung base atelectasis. Electronically Signed   By: Kerby Moors M.D.   On: 06/27/2021 07:53   Medications: I have reviewed the patient's current medications. Scheduled Meds:  Chlorhexidine Gluconate Cloth  6 each Topical Daily   FLUoxetine  10 mg Oral Daily   [START ON 06/29/2021] levothyroxine  50 mcg Oral Q48H   levothyroxine  75 mcg Oral Q48H   lidocaine  1 patch Transdermal q1600   loratadine  10 mg Oral Daily   mouth rinse  15 mL Mouth Rinse BID   midodrine  10 mg Oral TID   simvastatin  20 mg Oral QHS   Continuous Infusions:  sodium chloride Stopped (06/27/21 0819)   sodium chloride Stopped (06/27/21 2000)   cefTRIAXone (ROCEPHIN)  IV Stopped (06/28/21 0930)   norepinephrine (LEVOPHED) Adult infusion Stopped (06/27/21 1901)   octreotide  (SANDOSTATIN)    IV infusion 50 mcg/hr (06/28/21 1100)   PRN Meds:.acetaminophen, morphine injection, polyethylene glycol  Today's Vitals   06/28/21 1300 06/28/21 1400 06/28/21 1500 06/28/21 1547  BP: (!) 85/54 (!) 83/55 95/67   Pulse: 78 79 76   Resp: 11 20 15    Temp:    97.9 F (36.6 C)  TempSrc:    Oral  SpO2: 90% 92% 91%   Weight:      Height:      PainSc:       Body mass index is 31.09 kg/m.  Assessment: Principal Problem:   Hypotension  JENIECE HANNIS is a 81 y.o. 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  Decompensated liver cirrhosis with possible SBP and associated hepatic encephalopathy.  She also has bacteremia with positive blood cultures.Ascitic fluid tap yesterday showed a total cell count of 1600 with a neutrophil count of 65% which meets criteria for SBP. Culture so far negative.  Denies any hematemesis or melena.  She has had a fibula fracture questionable blood loss.  When I went in to see her she was quite confused and I am concerned she has overt encephalopathy secondary to the infection.  Her blood pressure still in the hypotensive range.  She is on oxygen.    Plan 1.  Continue antibiotics for SBP 2.  Intravenous 25 percent albumin 100 g today that is day 1 and on day 3.  Albumin orders have been placed for today.  Daily 25 g of albumin due to AKI 3.  Continue daily octreotide and midodrine due to renal failure 4.  Follow-up ascites fluid cultures and change antibiotics accordingly if needed 5.  Monitor CBC and transfuse if needed.  No present evidence of a GI bleed.  Continue screening for sepsis/infection. 6.  High mortality rate associated with SBP, renal failure.  Will suggest to discuss goals of care. 7.  After his acute episode will require lifelong prophylaxis for SBP      LOS: 1 day   [name], MD 06/28/2021, 1:45 PM

## 2021-06-28 NOTE — Progress Notes (Addendum)
[name] , MD [address] [place], [place], [place], [address] [address], [place], [place], [place], [address] Phone: [phone]  Fax: [phone]   Murphy Bundick Myhre is being followed for chronic liver disease, GI bleed day 1 of follow up   Subjective: She is alert and awake which is better than yesterday but still confused.   Objective: Vital signs in last 24 hours: Vitals:   06/28/21 0900 06/28/21 1000 06/28/21 1100 06/28/21 1200  BP: (!) 98/58 95/62 (!) 88/55 (!) 83/59  Pulse: 81 84 82 81  Resp: 12 14 15 17   Temp:    98.7 F (37.1 C)  TempSrc:    Oral  SpO2: 96% 94% 93% 93%  Weight:      Height:       Weight change:   Intake/Output Summary (Last 24 hours) at 06/28/2021 1345 Last data filed at 06/28/2021 1100 Gross per 24 hour  Intake 1118.46 ml  Output 445 ml  Net 673.46 ml     Exam: Heart:: Regular rate and rhythm Lungs: Decreased air entry bilaterally no added breath sounds Abdomen: General distention no tenderness no guarding no rigidity. Neurology: Alert and awake but oriented only x1 appears confused   Lab Results: @LABTEST2 @ Micro Results: Recent Results (from the past 240 hour(s))  Resp Panel by RT-PCR (Flu A&B, Covid) Nasopharyngeal Swab     Status: None   Collection Time: 06/27/21  7:55 AM   Specimen: Nasopharyngeal Swab; Nasopharyngeal(NP) swabs in vial transport medium  Result Value Ref Range Status   SARS Coronavirus 2 by RT PCR NEGATIVE NEGATIVE Final    Comment: (NOTE) SARS-CoV-2 target nucleic acids are NOT DETECTED.  The SARS-CoV-2 RNA is generally detectable in upper respiratory specimens during the acute phase of infection. The lowest concentration of SARS-CoV-2 viral copies this assay can detect is 138 copies/mL. A negative result does not preclude SARS-Cov-2 infection and should not be used as the sole basis for treatment or other patient management decisions. A negative result may occur with  improper specimen  collection/handling, submission of specimen other than nasopharyngeal swab, presence of viral mutation(s) within the areas targeted by this assay, and inadequate number of viral copies(<138 copies/mL). A negative result must be combined with clinical observations, patient history, and epidemiological information. The expected result is Negative.  Fact Sheet for Patients:  EntrepreneurPulse.com.au  Fact Sheet for Healthcare Providers:  IncredibleEmployment.be  This test is no t yet approved or cleared by the [place] FDA and  has been authorized for detection and/or diagnosis of SARS-CoV-2 by FDA under an Emergency Use Authorization (EUA). This EUA will remain  in effect (meaning this test can be used) for the duration of the COVID-19 declaration under Section 564(b)(1) of the Act, 21 U.S.C.section 360bbb-3(b)(1), unless the authorization is terminated  or revoked sooner.       Influenza A by PCR NEGATIVE NEGATIVE Final   Influenza B by PCR NEGATIVE NEGATIVE Final    Comment: (NOTE) The Xpert Xpress SARS-CoV-2/FLU/RSV plus assay is intended as an aid in the diagnosis of influenza from Nasopharyngeal swab specimens and should not be used as a sole basis for treatment. Nasal washings and aspirates are unacceptable for Xpert Xpress SARS-CoV-2/FLU/RSV testing.  Fact Sheet for Patients: EntrepreneurPulse.com.au  Fact Sheet for Healthcare Providers: IncredibleEmployment.be  This test is not yet approved or cleared by the [place] FDA and has been authorized for detection and/or diagnosis of SARS-CoV-2 by FDA under an Emergency Use Authorization (EUA). This  EUA will remain in effect (meaning this test can be used) for the duration of the COVID-19 declaration under Section 564(b)(1) of the Act, 21 U.S.C. section 360bbb-3(b)(1), unless the authorization is terminated or revoked.  Performed at [facility], [address] [place], [place] [address]   Blood culture (single)     Status: None (Preliminary result)   Collection Time: 06/27/21  7:55 AM   Specimen: BLOOD  Result Value Ref Range Status   Specimen Description BLOOD BLOOD RIGHT FOREARM  Final   Special Requests   Final    BOTTLES DRAWN AEROBIC AND ANAEROBIC Blood Culture results may not be optimal due to an excessive volume of blood received in culture bottles   Culture   Final    NO GROWTH 1 DAY Performed at [facility], [address], [place], [place] [address]    Report Status PENDING  Incomplete  Blood culture (single)     Status: None (Preliminary result)   Collection Time: 06/27/21 10:08 AM   Specimen: BLOOD LEFT HAND  Result Value Ref Range Status   Specimen Description BLOOD LEFT HAND  Final   Special Requests   Final    BOTTLES DRAWN AEROBIC AND ANAEROBIC Blood Culture adequate volume   Culture  Setup Time   Final    Organism ID to follow GRAM POSITIVE COCCI ANAEROBIC BOTTLE ONLY CRITICAL RESULT CALLED TO, READ BACK BY AND VERIFIED WITH: Performed at [facility], [address] [place], [place] [address]    Culture GRAM POSITIVE COCCI  Final   Report Status PENDING  Incomplete  Blood Culture ID Panel (Reflexed)     Status: Abnormal   Collection Time: 06/27/21 10:08 AM  Result Value Ref Range Status   Enterococcus faecalis NOT DETECTED NOT DETECTED Final   Enterococcus Faecium NOT DETECTED NOT DETECTED Final   Listeria monocytogenes NOT DETECTED NOT DETECTED Final   Staphylococcus species NOT DETECTED NOT DETECTED Final   Staphylococcus aureus (BCID) NOT DETECTED NOT DETECTED Final   Staphylococcus epidermidis NOT DETECTED NOT DETECTED Final   Staphylococcus lugdunensis NOT DETECTED NOT DETECTED Final   Streptococcus species DETECTED (A) NOT DETECTED Final    Comment: Not Enterococcus species, Streptococcus agalactiae, Streptococcus pyogenes, or Streptococcus  pneumoniae. CRITICAL RESULT CALLED TO, READ BACK BY AND VERIFIED WITH: KRISTIN MILLER 06/28/21 0741 MW    Streptococcus agalactiae NOT DETECTED NOT DETECTED Final   Streptococcus pneumoniae NOT DETECTED NOT DETECTED Final   Streptococcus pyogenes NOT DETECTED NOT DETECTED Final   A.calcoaceticus-baumannii NOT DETECTED NOT DETECTED Final   Bacteroides fragilis NOT DETECTED NOT DETECTED Final   Enterobacterales NOT DETECTED NOT DETECTED Final   Enterobacter cloacae complex NOT DETECTED NOT DETECTED Final   Escherichia coli NOT DETECTED NOT DETECTED Final   Klebsiella aerogenes NOT DETECTED NOT DETECTED Final   Klebsiella oxytoca NOT DETECTED NOT DETECTED Final   Klebsiella pneumoniae NOT DETECTED NOT DETECTED Final   Proteus species NOT DETECTED NOT DETECTED Final   Salmonella species NOT DETECTED NOT DETECTED Final   Serratia marcescens NOT DETECTED NOT DETECTED Final   Haemophilus influenzae NOT DETECTED NOT DETECTED Final   Neisseria meningitidis NOT DETECTED NOT DETECTED Final   Pseudomonas aeruginosa NOT DETECTED NOT DETECTED Final   Stenotrophomonas maltophilia NOT DETECTED NOT DETECTED Final   Candida albicans NOT DETECTED NOT DETECTED Final   Candida auris NOT DETECTED NOT DETECTED Final   Candida glabrata NOT DETECTED NOT DETECTED Final   Candida krusei NOT DETECTED NOT DETECTED  Final   Candida parapsilosis NOT DETECTED NOT DETECTED Final   Candida tropicalis NOT DETECTED NOT DETECTED Final   Cryptococcus neoformans/gattii NOT DETECTED NOT DETECTED Final    Comment: Performed at [facility], [address] [place], [place] [address]  MRSA Next Gen by PCR, Nasal     Status: None   Collection Time: 06/27/21 11:30 AM   Specimen: Nasal Mucosa; Nasal Swab  Result Value Ref Range Status   MRSA by PCR Next Gen NOT DETECTED NOT DETECTED Final    Comment: (NOTE) The GeneXpert MRSA Assay (FDA approved for NASAL specimens only), is one component of a comprehensive MRSA  colonization surveillance program. It is not intended to diagnose MRSA infection nor to guide or monitor treatment for MRSA infections. Test performance is not FDA approved in patients less than 73 years old. Performed at [facility], [address] [place], [place] [address]   Body fluid culture w Gram Stain     Status: None (Preliminary result)   Collection Time: 06/27/21  3:00 PM   Specimen: PATH Cytology Peritoneal fluid  Result Value Ref Range Status   Specimen Description   Final    PERITONEAL Performed at [facility], [address], [place], [place] [address]    Special Requests   Final    NONE Performed at [facility], [address] [place], [place] [address]    Gram Stain   Final    RARE WBC PRESENT,BOTH PMN AND MONONUCLEAR NO ORGANISMS SEEN    Culture   Final    NO GROWTH < 24 HOURS Performed at [facility], [place] [address] [place],  [address]    Report Status PENDING  Incomplete   Studies/Results: CT ABDOMEN PELVIS WO CONTRAST  Result Date: 06/27/2021 CLINICAL DATA:  Abdominal distension.  Sepsis.  Hypotension. EXAM: CT ABDOMEN AND PELVIS WITHOUT CONTRAST TECHNIQUE: Multidetector CT imaging of the abdomen and pelvis was performed following the standard protocol without IV contrast. RADIATION DOSE REDUCTION: This exam was performed according to the departmental dose-optimization program which includes automated exposure control, adjustment of the mA and/or kV according to patient size and/or use of iterative reconstruction technique. COMPARISON:  06/18/2020 FINDINGS: Lower chest: Heart is markedly enlarged. Small right pleural effusion noted. Bibasilar atelectasis evident. Hepatobiliary: Nodular liver contour is compatible with cirrhosis. Gallbladder surgically absent. No intrahepatic or extrahepatic biliary dilation. Pancreas: No focal mass lesion. No dilatation of the main duct. No intraparenchymal cyst. No peripancreatic  edema. Spleen: No splenomegaly. No focal mass lesion. Adrenals/Urinary Tract: Stable bilateral adrenal adenomas. Cortical scarring noted lower pole right kidney with punctate nonobstructing stones in the interpolar region. Stable 6 mm stone lower pole left kidney with punctate nonobstructing stone interpolar left kidney. No evidence for hydroureter. Bladder is obscured by beam hardening artifact from right hip replacement but there appears to be of Foley catheter in place in gas in the bladder lumen is compatible with the instrumentation. Stomach/Bowel: Stomach is nondistended. Duodenum is normally positioned as is the ligament of Treitz. No small bowel wall thickening. No small bowel dilatation. No gross colonic mass. No colonic wall thickening. Diverticular changes are noted in the left colon without evidence of diverticulitis. Vascular/Lymphatic: There is advanced atherosclerotic calcification of the abdominal aorta without aneurysm. There is no gastrohepatic or hepatoduodenal ligament lymphadenopathy. No retroperitoneal or mesenteric lymphadenopathy. No pelvic sidewall lymphadenopathy. Reproductive: Inferior pelvis obscured by beam hardening artifact. Lobular soft tissue in the upper central pelvis may be related  to the uterus/ovaries but cannot be definitively characterized. Other: Moderate volume abdominopelvic ascites. Musculoskeletal: Status post right hip replacement No worrisome lytic or sclerotic osseous abnormality. IMPRESSION: 1. No acute findings in the abdomen or pelvis. 2. Nodular liver contour compatible with cirrhosis. 3. Moderate volume abdominopelvic ascites. 4. Stable bilateral adrenal adenomas. 5. Bilateral nonobstructing renal stones. 6. Lobular soft tissue in the upper central pelvis may be related to the uterus/ovaries but cannot be definitively characterized and has been incompletely visualized. Pelvic ultrasound recommended to further evaluate. 7. Cardiomegaly with small right pleural  effusion. 8. Aortic Atherosclerosis (ICD10-I70.0). Electronically Signed   By: Misty Stanley M.D.   On: 06/27/2021 16:22   DG Chest 2 View  Result Date: 06/26/2021 CLINICAL DATA:  Shortness of breath EXAM: CHEST - 2 VIEW COMPARISON:  Chest radiograph dated February 12, 2021 FINDINGS: The heart is markedly enlarged. 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 No focal consolidation or pleural effusion. No acute osseous abnormality. IMPRESSION: Cardiomegaly without evidence of focal consolidation or pleural effusion. Electronically Signed   By: Keane Police D.O.   On: 06/26/2021 18:39   DG Tibia/Fibula Left  Result Date: 06/27/2021 CLINICAL DATA:  Shortness of breath.  Status post fall. EXAM: LEFT TIBIA AND FIBULA - 2 VIEW COMPARISON:  None. FINDINGS: There is a fracture deformity involving the proximal metadiaphysis of the fibula. The fracture fragments appear to be in near anatomic alignment. No additional fracture or dislocation identified. IMPRESSION: Acute fracture of the proximal metadiaphysis of the fibula. Electronically Signed   By: Kerby Moors M.D.   On: 06/27/2021 07:56   DG Ankle Complete Left  Result Date: 06/27/2021 CLINICAL DATA:  Multiple falls, ankle injury, initial encounter. EXAM: LEFT ANKLE COMPLETE - 3+ VIEW COMPARISON:  Left tibia fibula 06/27/2021. FINDINGS: Ankle mortise is intact.  No fracture. IMPRESSION: No acute findings. Electronically Signed   By: Lorin Picket M.D.   On: 06/27/2021 08:50   DG Abd 1 View  Result Date: 06/27/2021 CLINICAL DATA:  Abdominal distension. EXAM: ABDOMEN - 1 VIEW COMPARISON:  CT 02/12/2021.  Radiographs 07/16/2019. FINDINGS: 1330 hours. Two views submitted. The bowel gas pattern appears nonobstructive. There is no supine evidence of bowel wall thickening or free intraperitoneal air. Scattered vascular calcifications are noted. Patient is status post cholecystectomy and right total hip arthroplasty.  IMPRESSION: No radiographic evidence of active abdominal process. No findings to suggest recurrent large volume ascites. Electronically Signed   By: Richardean Sale M.D.   On: 06/27/2021 13:43   CT HEAD WO CONTRAST (5MM)  Result Date: 06/27/2021 CLINICAL DATA:  Head trauma, moderate-severe; Neck trauma (Age >= 65y) EXAM: CT HEAD WITHOUT CONTRAST CT CERVICAL SPINE WITHOUT CONTRAST TECHNIQUE: Multidetector CT imaging of the head and cervical spine was performed following the standard protocol without intravenous contrast. Multiplanar CT image reconstructions of the cervical spine were also generated. RADIATION DOSE REDUCTION: This exam was performed according to the departmental dose-optimization program which includes automated exposure control, adjustment of the mA and/or kV according to patient size and/or use of iterative reconstruction technique. COMPARISON:  February 14, 23. FINDINGS: CT HEAD FINDINGS Brain: No evidence of acute infarction, hemorrhage, hydrocephalus, extra-axial collection or mass lesion/mass effect. Similar chronic right frontal encephalomalacia. Similar additional focal encephalomalacia in the right perirolandic frontoparietal cortex. Similar additional patchy white matter hypoattenuation, nonspecific but compatible with chronic microvascular ischemic disease. Partially empty sella. Vascular: Calcific intracranial atherosclerosis. No hyperdense vessel identified. Skull: No evidence of acute fracture. Sinuses/Orbits:  Opacified anterior left ethmoid air cells. Otherwise, visualized sinuses are clear. Unremarkable orbits. Other: No mastoid effusions.  Cerumen in bilateral EACs. CT CERVICAL SPINE FINDINGS Alignment: Similar mild anterolisthesis of C3 on C4, C4 on C5, and C5 on C6. No new sagittal subluxation. Skull base and vertebrae: Unchanged vertebral body heights. No evidence of acute fracture. Soft tissues and spinal canal: No prevertebral fluid or swelling. No visible canal hematoma.  Disc levels:  Similar multilevel degenerative change, mild for age. Upper chest: Interlobular septal thickening in the right greater than left lung apices with possible small layering right pleural effusion and emphysema. IMPRESSION: CT head: 1. No evidence of acute intracranial abnormality. 2. 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 CT cervical spine: 1. No evidence of acute fracture or traumatic malalignment. 2. Partially imaged interlobular septal thickening in the lung apices, compatible with mild interstitial edema. Possible small layering right pleural effusion. Emphysema (ICD10-J43.9). Electronically Signed   By: Margaretha Sheffield M.D.   On: 06/27/2021 08:10   CT Head Wo Contrast  Result Date: 06/26/2021 CLINICAL DATA:  Mental status change, unknown cause; Neck pain, acute, no red flags EXAM: CT HEAD WITHOUT CONTRAST CT CERVICAL SPINE WITHOUT CONTRAST TECHNIQUE: Multidetector CT imaging of the head and cervical spine was performed following the standard protocol without intravenous contrast. Multiplanar CT image reconstructions of the cervical spine were also generated. RADIATION DOSE REDUCTION: This exam was performed according to the departmental dose-optimization program which includes automated exposure control, adjustment of the mA and/or kV according to patient size and/or use of iterative reconstruction technique. COMPARISON:  CT cervical spine 03/28/2020 FINDINGS: CT HEAD FINDINGS BRAIN: BRAIN Patchy and confluent areas of decreased attenuation are noted throughout the deep and periventricular white matter of the cerebral hemispheres bilaterally, compatible with chronic microvascular ischemic disease. Right frontal lobe encephalomalacia. No evidence of large-territorial acute infarction. No parenchymal hemorrhage. No mass lesion. No extra-axial collection. No mass effect or midline shift. No hydrocephalus. Basilar cisterns are patent. Vascular: No hyperdense vessel.  Skull: No acute fracture or focal lesion. Sinuses/Orbits: Paranasal sinuses and mastoid air cells are clear. Bilateral lens replacement. Otherwise the orbits are unremarkable. Other: None. CT CERVICAL SPINE FINDINGS Alignment: Stable grade 1 anterolisthesis of C3 on C4, C4 on C5, C5 on C6. Skull base and vertebrae: Mild multilevel degenerative changes of the spine. No associated severe osseous central canal or neural foraminal stenosis. No acute fracture. No aggressive appearing focal osseous lesion or focal pathologic process. Soft tissues and spinal canal: No prevertebral fluid or swelling. No visible canal hematoma. Upper chest: Unremarkable. Other: Atherosclerotic plaque of the carotid arteries within the neck. IMPRESSION: 1. No acute intracranial abnormality. 2. No acute displaced fracture or traumatic listhesis of the cervical spine. Electronically Signed   By: Iven Finn M.D.   On: 06/26/2021 20:41   CT Cervical Spine Wo Contrast  Result Date: 06/27/2021 CLINICAL DATA:  Head trauma, moderate-severe; Neck trauma (Age >= 65y) EXAM: CT HEAD WITHOUT CONTRAST CT CERVICAL SPINE WITHOUT CONTRAST TECHNIQUE: Multidetector CT imaging of the head and cervical spine was performed following the standard protocol without intravenous contrast. Multiplanar CT image reconstructions of the cervical spine were also generated. RADIATION DOSE REDUCTION: This exam was performed according to the departmental dose-optimization program which includes automated exposure control, adjustment of the mA and/or kV according to patient size and/or use of iterative reconstruction technique. COMPARISON:  February 14, 23. FINDINGS: CT HEAD FINDINGS Brain: No evidence of acute infarction, hemorrhage, hydrocephalus, extra-axial collection or  mass lesion/mass effect. Similar chronic right frontal encephalomalacia. Similar additional focal encephalomalacia in the right perirolandic frontoparietal cortex. Similar additional patchy white  matter hypoattenuation, nonspecific but compatible with chronic microvascular ischemic disease. Partially empty sella. Vascular: Calcific intracranial atherosclerosis. No hyperdense vessel identified. Skull: No evidence of acute fracture. Sinuses/Orbits: Opacified anterior left ethmoid air cells. Otherwise, visualized sinuses are clear. Unremarkable orbits. Other: No mastoid effusions.  Cerumen in bilateral EACs. CT CERVICAL SPINE FINDINGS Alignment: Similar mild anterolisthesis of C3 on C4, C4 on C5, and C5 on C6. No new sagittal subluxation. Skull base and vertebrae: Unchanged vertebral body heights. No evidence of acute fracture. Soft tissues and spinal canal: No prevertebral fluid or swelling. No visible canal hematoma. Disc levels:  Similar multilevel degenerative change, mild for age. Upper chest: Interlobular septal thickening in the right greater than left lung apices with possible small layering right pleural effusion and emphysema. IMPRESSION: CT head: 1. No evidence of acute intracranial abnormality. 2. 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 CT cervical spine: 1. No evidence of acute fracture or traumatic malalignment. 2. Partially imaged interlobular septal thickening in the lung apices, compatible with mild interstitial edema. Possible small layering right pleural effusion. Emphysema (ICD10-J43.9). Electronically Signed   By: Margaretha Sheffield M.D.   On: 06/27/2021 08:10   CT Cervical Spine Wo Contrast  Result Date: 06/26/2021 CLINICAL DATA:  Mental status change, unknown cause; Neck pain, acute, no red flags EXAM: CT HEAD WITHOUT CONTRAST CT CERVICAL SPINE WITHOUT CONTRAST TECHNIQUE: Multidetector CT imaging of the head and cervical spine was performed following the standard protocol without intravenous contrast. Multiplanar CT image reconstructions of the cervical spine were also generated. RADIATION DOSE REDUCTION: This exam was performed according to the departmental  dose-optimization program which includes automated exposure control, adjustment of the mA and/or kV according to patient size and/or use of iterative reconstruction technique. COMPARISON:  CT cervical spine 03/28/2020 FINDINGS: CT HEAD FINDINGS BRAIN: BRAIN Patchy and confluent areas of decreased attenuation are noted throughout the deep and periventricular white matter of the cerebral hemispheres bilaterally, compatible with chronic microvascular ischemic disease. Right frontal lobe encephalomalacia. No evidence of large-territorial acute infarction. No parenchymal hemorrhage. No mass lesion. No extra-axial collection. No mass effect or midline shift. No hydrocephalus. Basilar cisterns are patent. Vascular: No hyperdense vessel. Skull: No acute fracture or focal lesion. Sinuses/Orbits: Paranasal sinuses and mastoid air cells are clear. Bilateral lens replacement. Otherwise the orbits are unremarkable. Other: None. CT CERVICAL SPINE FINDINGS Alignment: Stable grade 1 anterolisthesis of C3 on C4, C4 on C5, C5 on C6. Skull base and vertebrae: Mild multilevel degenerative changes of the spine. No associated severe osseous central canal or neural foraminal stenosis. No acute fracture. No aggressive appearing focal osseous lesion or focal pathologic process. Soft tissues and spinal canal: No prevertebral fluid or swelling. No visible canal hematoma. Upper chest: Unremarkable. Other: Atherosclerotic plaque of the carotid arteries within the neck. IMPRESSION: 1. No acute intracranial abnormality. 2. No acute displaced fracture or traumatic listhesis of the cervical spine. Electronically Signed   By: Iven Finn M.D.   On: 06/26/2021 20:41   US Venous Img Lower Bilateral (DVT)  Result Date: 06/27/2021 CLINICAL DATA:  Bilateral lower extremity pain and edema. History of smoking and malignancy. Evaluate for DVT. EXAM: BILATERAL LOWER EXTREMITY VENOUS DOPPLER ULTRASOUND TECHNIQUE: Gray-scale sonography with graded  compression, as well as color Doppler and duplex ultrasound were performed to evaluate the lower extremity deep venous systems from the level of  the common femoral vein and including the common femoral, femoral, profunda femoral, popliteal and calf veins including the posterior tibial, peroneal and gastrocnemius veins when visible. The superficial great saphenous vein was also interrogated. Spectral Doppler was utilized to evaluate flow at rest and with distal augmentation maneuvers in the common femoral, femoral and popliteal veins. COMPARISON:  None. FINDINGS: RIGHT LOWER EXTREMITY Common Femoral Vein: No evidence of thrombus. Normal compressibility, respiratory phasicity and response to augmentation. Saphenofemoral Junction: No evidence of thrombus. Normal compressibility and flow on color Doppler imaging. Profunda Femoral Vein: No evidence of thrombus. Normal compressibility and flow on color Doppler imaging. Femoral Vein: No evidence of thrombus. Normal compressibility, respiratory phasicity and response to augmentation. Popliteal Vein: No evidence of thrombus. Normal compressibility, respiratory phasicity and response to augmentation. Calf Veins: No evidence of thrombus. Normal compressibility and flow on color Doppler imaging. Superficial Great Saphenous Vein: No evidence of thrombus. Normal compressibility. Venous Reflux:  None. Other Findings:  None. LEFT LOWER EXTREMITY Common Femoral Vein: No evidence of thrombus. Normal compressibility, respiratory phasicity and response to augmentation. Saphenofemoral Junction: No evidence of thrombus. Normal compressibility and flow on color Doppler imaging. Profunda Femoral Vein: No evidence of thrombus. Normal compressibility and flow on color Doppler imaging. Femoral Vein: No evidence of thrombus. Normal compressibility, respiratory phasicity and response to augmentation. Popliteal Vein: No evidence of thrombus. Normal compressibility, respiratory phasicity and  response to augmentation. Calf Veins: No evidence of thrombus. Normal compressibility and flow on color Doppler imaging. Superficial Great Saphenous Vein: No evidence of thrombus. Normal compressibility. Venous Reflux:  None. Other Findings:  None. IMPRESSION: No evidence of DVT within either lower extremity. Electronically Signed   By: Sandi Mariscal M.D.   On: 06/27/2021 15:59   US Paracentesis  Result Date: 06/27/2021 INDICATION: Patient with a history of cirrhosis and recurrent ascites admitted with sepsis. Interventional radiology asked to perform a diagnostic and therapeutic paracentesis. EXAM: ULTRASOUND GUIDED PARACENTESIS MEDICATIONS: 1% lidocaine 10 mL COMPLICATIONS: None immediate. PROCEDURE: Informed written consent was obtained from the patient after a discussion of the risks, benefits and alternatives to treatment. A timeout was performed prior to the initiation of the procedure. Initial ultrasound scanning demonstrates a large amount of ascites within the left lower abdominal quadrant. The left lower abdomen was prepped and draped in the usual sterile fashion. 1% lidocaine was used for local anesthesia. Procedure was done at the bedside in the ICU due to patient's critical status. Following this, a 19 gauge, 7-cm, Yueh catheter was introduced. An ultrasound image was saved for documentation purposes. The paracentesis was performed. The catheter was removed and a dressing was applied. The patient tolerated the procedure well without immediate post procedural complication. FINDINGS: A total of approximately 3.2 L of clear yellow fluid was removed. Samples were sent to the laboratory as requested by the clinical team. IMPRESSION: Successful ultrasound-guided paracentesis yielding 3.2 liters of peritoneal fluid. Read by: Soyla Dryer, NP Electronically Signed   By: Albin Felling M.D.   On: 06/27/2021 16:12   DG Chest Portable 1 View  Result Date: 06/27/2021 CLINICAL DATA:  Shortness of breath.   Left leg pain.  Fall. EXAM: PORTABLE CHEST 1 VIEW COMPARISON:  06/26/2021 FINDINGS: Left chest wall ICD is noted with leads in the right atrial appendage and right ventricle. Cardiac enlargement. Lung volumes are low. Mild diffuse increase interstitial markings are noted bilaterally concerning for mild edema. No airspace consolidation. Atelectasis is noted within the right base. IMPRESSION: 1. Cardiac enlargement and mild  pulmonary edema. 2. Low lung volumes with right lung base atelectasis. Electronically Signed   By: Kerby Moors M.D.   On: 06/27/2021 07:53   Medications: I have reviewed the patient's current medications. Scheduled Meds:  Chlorhexidine Gluconate Cloth  6 each Topical Daily   FLUoxetine  10 mg Oral Daily   [START ON 06/29/2021] levothyroxine  50 mcg Oral Q48H   levothyroxine  75 mcg Oral Q48H   lidocaine  1 patch Transdermal q1600   loratadine  10 mg Oral Daily   mouth rinse  15 mL Mouth Rinse BID   midodrine  10 mg Oral TID   simvastatin  20 mg Oral QHS   Continuous Infusions:  sodium chloride Stopped (06/27/21 0819)   sodium chloride Stopped (06/27/21 2000)   cefTRIAXone (ROCEPHIN)  IV Stopped (06/28/21 0930)   norepinephrine (LEVOPHED) Adult infusion Stopped (06/27/21 1901)   octreotide  (SANDOSTATIN)    IV infusion 50 mcg/hr (06/28/21 1100)   PRN Meds:.acetaminophen, morphine injection, polyethylene glycol  Today's Vitals   06/28/21 1300 06/28/21 1400 06/28/21 1500 06/28/21 1547  BP: (!) 85/54 (!) 83/55 95/67   Pulse: 78 79 76   Resp: 11 20 15    Temp:    97.9 F (36.6 C)  TempSrc:    Oral  SpO2: 90% 92% 91%   Weight:      Height:      PainSc:       Body mass index is 31.09 kg/m.  Assessment: Principal Problem:   Hypotension  CASSANDRE OLEKSY is a 81 y.o. 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  Decompensated liver cirrhosis with possible SBP and associated hepatic encephalopathy.  She also has bacteremia with positive blood cultures.Ascitic fluid tap yesterday showed a total cell count of 1600 with a neutrophil count of 65% which meets criteria for SBP. Culture so far negative.  Denies any hematemesis or melena.  She has had a fibula fracture questionable blood loss.  When I went in to see her she was quite confused and I am concerned she has overt encephalopathy secondary to the infection.  Her blood pressure still in the hypotensive range.  She is on oxygen.    Plan 1.  Continue antibiotics for SBP 2.  Intravenous 25 percent albumin 100 g today that is day 1 and on day 3.  Albumin orders have been placed for today.  Daily 25 g of albumin due to AKI 3.  Continue daily octreotide and midodrine due to renal failure 4.  Follow-up ascites fluid cultures and change antibiotics accordingly if needed 5.  Monitor CBC and transfuse if needed.  No present evidence of a GI bleed.  Continue screening for sepsis/infection. 6.  High mortality rate associated with SBP, renal failure.  Will suggest to discuss goals of care. 7.  After his acute episode will require lifelong prophylaxis for SBP      LOS: 1 day   [name], MD 06/28/2021, 1:45 PM

## 2021-06-28 NOTE — Progress Notes (Signed)
[place] for Electrolyte Monitoring and Replacement   Recent Labs: Potassium (mmol/L)  Date Value  06/28/2021 4.1   Magnesium (mg/dL)  Date Value  06/28/2021 2.4   Calcium (mg/dL)  Date Value  06/28/2021 8.2 (L)   Calcium, Total (PTH) (mg/dL)  Date Value  09/07/2019 8.6 (L)   Albumin (g/dL)  Date Value  06/27/2021 2.9 (L)   Phosphorus (mg/dL)  Date Value  06/28/2021 6.5 (H)   Sodium (mmol/L)  Date Value  06/28/2021 138  03/31/2017 140   Corrected Ca 9.4 mg/dL  Assessment: 81 y.o. female with past medical history of CAD, CKD, AICD, cirrhosis, requiring recurrent paracentesis, multiple myeloma, here with fall. Pharmacy is asked to follow and replace electrolytes while in the  CCU.  Goal of Therapy:  Electrolytes WNL  Plan:  No electrolyte replacement warranted for today Recheck electrolytes in am  Dallie Piles ,PharmD Clinical Pharmacist 06/28/2021 7:12 AM

## 2021-06-28 NOTE — Progress Notes (Signed)
PHARMACY - PHYSICIAN COMMUNICATION CRITICAL VALUE ALERT - BLOOD CULTURE IDENTIFICATION (BCID)  Heather Peterson is an 81 y.o. female who presented to [facility] on 06/27/2021 with a chief complaint of abdominal distention and falls  Assessment:  blood cultures from 2/15 with GPC in 1 of 4 bottles,  BCID = Streptococcus species. Decompensated liver cirrhosis and possible SBP.     Name of physician (or Provider) Contacted: Dr Merrilee Jansky and Darel Hong  Current antibiotics: Ceftriaxone  Changes to prescribed antibiotics recommended:  Patient is on recommended antibiotics - No changes needed  Results for orders placed or performed during the hospital encounter of 06/27/21  Blood Culture ID Panel (Reflexed) (Collected: 06/27/2021 10:08 AM)  Result Value Ref Range   Enterococcus faecalis NOT DETECTED NOT DETECTED   Enterococcus Faecium NOT DETECTED NOT DETECTED   Listeria monocytogenes NOT DETECTED NOT DETECTED   Staphylococcus species NOT DETECTED NOT DETECTED   Staphylococcus aureus (BCID) NOT DETECTED NOT DETECTED   Staphylococcus epidermidis NOT DETECTED NOT DETECTED   Staphylococcus lugdunensis NOT DETECTED NOT DETECTED   Streptococcus species DETECTED (A) NOT DETECTED   Streptococcus agalactiae NOT DETECTED NOT DETECTED   Streptococcus pneumoniae NOT DETECTED NOT DETECTED   Streptococcus pyogenes NOT DETECTED NOT DETECTED   A.calcoaceticus-baumannii NOT DETECTED NOT DETECTED   Bacteroides fragilis NOT DETECTED NOT DETECTED   Enterobacterales NOT DETECTED NOT DETECTED   Enterobacter cloacae complex NOT DETECTED NOT DETECTED   Escherichia coli NOT DETECTED NOT DETECTED   Klebsiella aerogenes NOT DETECTED NOT DETECTED   Klebsiella oxytoca NOT DETECTED NOT DETECTED   Klebsiella pneumoniae NOT DETECTED NOT DETECTED   Proteus species NOT DETECTED NOT DETECTED   Salmonella species NOT DETECTED NOT DETECTED   Serratia marcescens NOT DETECTED NOT DETECTED   Haemophilus influenzae NOT  DETECTED NOT DETECTED   Neisseria meningitidis NOT DETECTED NOT DETECTED   Pseudomonas aeruginosa NOT DETECTED NOT DETECTED   Stenotrophomonas maltophilia NOT DETECTED NOT DETECTED   Candida albicans NOT DETECTED NOT DETECTED   Candida auris NOT DETECTED NOT DETECTED   Candida glabrata NOT DETECTED NOT DETECTED   Candida krusei NOT DETECTED NOT DETECTED   Candida parapsilosis NOT DETECTED NOT DETECTED   Candida tropicalis NOT DETECTED NOT DETECTED   Cryptococcus neoformans/gattii NOT DETECTED NOT DETECTED   Doreene Eland, PharmD, BCPS, BCIDP Work Cell: [phone] 06/28/2021 7:56 AM

## 2021-06-28 NOTE — Progress Notes (Signed)
Patient ID: Heather Peterson, female   DOB: [birth year], 81 y.o.   MRN: [id number]  Subjective: The patient appears to be somewhat brighter and more alert today as compared to yesterday.  She has no new complaints pertaining to her left knee or lower extremity.   Objective: Vital signs in last 24 hours: Temp:  [98.2 F (36.8 C)-99.7 F (37.6 C)] 98.7 F (37.1 C) (02/16 1200) Pulse Rate:  [75-85] 79 (02/16 1400) Resp:  [10-24] 20 (02/16 1400) BP: (78-112)/(50-83) 83/55 (02/16 1400) SpO2:  [90 %-98 %] 92 % (02/16 1400) Weight:  [79.6 kg] 79.6 kg (02/16 0500)  Intake/Output from previous day: 02/15 0701 - 02/16 0700 In: 1355.2 [I.V.:498.5; Blood:856.7] Out: 245 [Urine:245] Intake/Output this shift: Total I/O In: 369.9 [P.O.:120; I.V.:149.9; IV Piggyback:100] Out: 300 [Urine:300]  Recent Labs    06/26/21 1813 06/27/21 0634 06/27/21 1336 06/27/21 2247 06/28/21 0453  HGB 7.4* 6.7* 7.9* 9.2* 9.2*   Recent Labs    06/27/21 0634 06/27/21 1336 06/27/21 2247 06/28/21 0453  WBC 8.9  --   --  8.1  RBC 2.41*  --   --  3.28*  HCT 22.9*   < > 29.3* 29.4*  PLT 211  --   --  174   < > = values in this interval not displayed.   Recent Labs    06/27/21 0634 06/28/21 0453  NA 135 138  K 4.1 4.1  CL 101 104  CO2 25 22  BUN 50* 51*  CREATININE 2.53* 2.60*  GLUCOSE 94 126*  CALCIUM 8.5* 8.2*   Recent Labs    06/27/21 1336  INR 1.5*    Physical Exam: Orthopedic examination again is limited to the left knee and lower extremity.  The patient's physical examination findings are unchanged as compared to yesterday.  She remains grossly neurovascularly intact to the left lower extremity and foot.  Assessment: Isolated closed nondisplaced left proximal fibular fracture.  Plan: Again, the patient is reminded that she may begin to weight-bear as tolerated on the left leg with physical therapy once she is cleared medically.  She may use the knee immobilizer for support in case  her knee buckles secondary to pain.  Thank you for asking me to participate in the care of this most pleasant and unfortunate woman.  I will sign off at this time.  Please reconsult me if you have further orthopedic concerns.  She may follow-up in our office in 5 to 6 weeks for re-evaluation.   Marshall Cork Albin Duckett 06/28/2021, 3:03 PM

## 2021-06-28 NOTE — Evaluation (Addendum)
Clinical/Bedside Swallow Evaluation Patient Details  Name: MACIL CRADY MRN: [id number] Date of Birth: May 14, 1940  Today's Date: 06/28/2021 Time: SLP Start Time (ACUTE ONLY): 1000 SLP Stop Time (ACUTE ONLY): 50 SLP Time Calculation (min) (ACUTE ONLY): 20 min  Past Medical History:  Past Medical History:  Diagnosis Date   Anxiety    Chronic combined systolic and diastolic CHF (congestive heart failure) (HCC)    Chronic kidney disease    Coronary artery disease    Depression    Diabetes mellitus without complication ([place])    Diabetes mellitus, type II ([place])    Hypertension    MI (myocardial infarction) ([place])    x 5   Pacemaker    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[place])    Prolonged Q-T interval on ECG    Thyroid disease    Past Surgical History:  Past Surgical History:  Procedure Laterality Date   CENTRAL LINE INSERTION  03/11/2017   Procedure: CENTRAL LINE INSERTION;  Surgeon: Leonie Man, MD;  Location: [place] [facility];  Service: Cardiovascular;;   CHOLECYSTECTOMY     COLONOSCOPY WITH PROPOFOL N/A 09/01/2019   Procedure: COLONOSCOPY WITH PROPOFOL;  Surgeon: Toledo, Benay Pike, MD;  Location: [facility] ENDOSCOPY;  Service: Gastroenterology;  Laterality: N/A;   CORONARY STENT INTERVENTION W/IMPELLA N/A 03/11/2017   Procedure: Coronary Stent Intervention w/Impella;  Surgeon: Leonie Man, MD;  Location: [place] [facility];  Service: Cardiovascular;  Laterality: N/A;   ESOPHAGOGASTRODUODENOSCOPY (EGD) WITH PROPOFOL N/A 09/01/2019   Procedure: ESOPHAGOGASTRODUODENOSCOPY (EGD) WITH PROPOFOL;  Surgeon: Toledo, Benay Pike, MD;  Location: [facility] ENDOSCOPY;  Service: Gastroenterology;  Laterality: N/A;   ESOPHAGOGASTRODUODENOSCOPY (EGD) WITH PROPOFOL N/A 09/08/2019   Procedure: ESOPHAGOGASTRODUODENOSCOPY (EGD) WITH PROPOFOL;  Surgeon: [name], MD;  Location: [facility] ENDOSCOPY;  Service: Gastroenterology;  Laterality: N/A;   ESOPHAGOGASTRODUODENOSCOPY (EGD) WITH PROPOFOL N/A  02/15/2021   Procedure: ESOPHAGOGASTRODUODENOSCOPY (EGD) WITH PROPOFOL;  Surgeon: [name], MD;  Location: [facility] ENDOSCOPY;  Service: Gastroenterology;  Laterality: N/A;   EYE SURGERY     HIP ARTHROPLASTY Right 03/29/2020   Procedure: ARTHROPLASTY BIPOLAR HIP (HEMIARTHROPLASTY);  Surgeon: Corky Mull, MD;  Location: [facility] ORS;  Service: Orthopedics;  Laterality: Right;   INTRAVASCULAR PRESSURE WIRE/FFR STUDY N/A 03/11/2017   Procedure: INTRAVASCULAR PRESSURE WIRE/FFR STUDY;  Surgeon: Leonie Man, MD;  Location: [place] [facility];  Service: Cardiovascular;  Laterality: N/A;   INTRAVASCULAR ULTRASOUND/IVUS N/A 03/11/2017   Procedure: Intravascular Ultrasound/IVUS;  Surgeon: Leonie Man, MD;  Location: [place] [facility];  Service: Cardiovascular;  Laterality: N/A;   LEFT HEART CATH AND CORONARY ANGIOGRAPHY N/A 03/05/2017   Procedure: LEFT HEART CATH AND CORONARY ANGIOGRAPHY;  Surgeon: Isaias Cowman, MD;  Location: [place] [facility];  Service: Cardiovascular;  Laterality: N/A;   PACEMAKER IMPLANT     pacemaker/defibrillator Left    HPI:  Per [id number] H&P "This is a 81 yo female who presented [facility] ER via EMS on 02/15 with c/o abdominal distension and recurrents falls. Per EMS upon their arrival at pts home pt hypotensive bp 84/48, therefore 500 ml iv fluid bolus administered.  She was also placed on 3L O2 via nasal canula (wears prn home O2).       She previously presented to the ER on 02/14 via EMS with c/o fluid retention in her abdomen and weeping of bilateral lower extremities.  She reported requiring paracentesis x2 over the past month due to recurrent ascites.  She also reported she had been previously  scheduled for an outpatient paracentesis on 02/16. She also endorsed a headache, decreased oral intake (onset 02/12), nausea/vomiting, and some shortness of breath.  At that time bp borderline 81/48.  CXR, EKG, CT Head/Cervical Spine were negative.  Lab results: T4, free  (Direct) 0.42/hgb 7.4, and ammonia 18.  Pt deemed stable and did not require hospital admission." CXR 06/27/21 "1. Cardiac enlargement and mild pulmonary edema.  2. Low lung volumes with right lung base atelectasis."    Assessment / Plan / Recommendation  Clinical Impression  Pt seen for clinical swallowing evaluation. Pt alert, pleasant, and cooperative. On 2L/min O2 via [place]. Confusion noted. Pt with visual hallucintations, "is that a real woman in that chair?" RN made aware. Cleared with RN prior to evaluation  Oral motor examination unremarkable with exception of pt wearing upper and lower dentures. Pt stated she consumed a regular diet PTA and avoided dry/particulate items. Pt reports globus sensation with "dry solid" intermittently PTA which not reproduced on today's evaluation.   Pt given trials of solid, pureed, and thin liquids. Pt with s/sx mild oral dysphagia c/b mildly prolonged mastication and trace oral residual. Residual cleared with liquid wash. Pharyngeal swallow appeared [facility] per clinical assessment. No overt or subtle s/sx pharyngeal dysphagia across trials. To palpation, pt with seemingly timely swallow initiation and seemingly adequate laryngeal elevation. No change to vocal quality across trials.   Pt is at increased risk for aspiration/aspiration PNA given AMS and multiple medical comorbidities.   Recommend initiation of a mech soft diet with thin liquids - add extra gravies, sauces, condiments to moisten dry solids- and safe swallowing strategies/aspiration precautions as outlined below.  SLP to f/u per POC for diet tolerance and trials of upgraded textures.   Pt and RN made aware of diet recommendations, safe swallowing strategies/aspiration precautions, and SLP POC. Pt verbalized understanding, but may benefit from reinforcement of content given AMS.   SLP Visit Diagnosis: Dysphagia, oral phase (R13.11)    Aspiration Risk  Mild aspiration risk    Diet Recommendation  Dysphagia 3 (Mech soft);Thin liquid   Medication Administration: Whole meds with liquid (vs whole with puree; as tolerated) Supervision: Patient able to self feed (with set up) Compensations: Minimize environmental distractions;Slow rate;Small sips/bites;Follow solids with liquid Postural Changes: Seated upright at 90 degrees;Remain upright for at least 30 minutes after po intake    Other  Recommendations Oral Care Recommendations: Oral care QID;Oral care before and after PO    Recommendations for follow up therapy are one component of a multi-disciplinary discharge planning process, led by the attending physician.  Recommendations may be updated based on patient status, additional functional criteria and insurance authorization.  Follow up Recommendations  (TBD)      Assistance Recommended at Discharge Frequent or constant Supervision/Assistance  Functional Status Assessment Patient has had a recent decline in their functional status and demonstrates the ability to make significant improvements in function in a reasonable and predictable amount of time.  Frequency and Duration min 2x/week  2 weeks       Prognosis Prognosis for Safe Diet Advancement: Fair Barriers to Reach Goals:  (AMS)      Swallow Study   General Date of Onset: 06/27/21 HPI: Per 38 H&P "This is a 81 yo female who presented to [facility] ER via EMS on 02/15 with c/o abdominal distension and recurrents falls. Per EMS upon their arrival at pts home pt hypotensive bp 84/48, therefore 500 ml iv fluid bolus administered.  She was also placed  on 3L O2 via nasal canula (wears prn home O2).       She previously presented to the ER on 02/14 via EMS with c/o fluid retention in her abdomen and weeping of bilateral lower extremities.  She reported requiring paracentesis x2 over the past month due to recurrent ascites.  She also reported she had been previously scheduled for an outpatient paracentesis on 02/16. She also endorsed  a headache, decreased oral intake (onset 02/12), nausea/vomiting, and some shortness of breath.  At that time bp borderline 81/48.  CXR, EKG, CT Head/Cervical Spine were negative.  Lab results: T4, free (Direct) 0.42/hgb 7.4, and ammonia 18.  Pt deemed stable and did not require hospital admission." CXR 06/27/21 "1. Cardiac enlargement and mild pulmonary edema.  2. Low lung volumes with right lung base atelectasis." Type of Study: Bedside Swallow Evaluation Diet Prior to this Study:  (NPO) Temperature Spikes Noted: No Respiratory Status: Nasal cannula (2L/min) History of Recent Intubation: No Behavior/Cognition: Alert;Cooperative;Pleasant mood;Confused Oral Cavity Assessment: Within Functional Limits Oral Care Completed by SLP: Yes Oral Cavity - Dentition: Dentures, top;Dentures, bottom Vision: Functional for self-feeding Self-Feeding Abilities: Able to feed self;Needs set up Patient Positioning: Upright in bed Baseline Vocal Quality: Normal Volitional Cough: Strong Volitional Swallow: Able to elicit    Oral/Motor/Sensory Function Overall Oral Motor/Sensory Function: Within functional limits   Thin Liquid Thin Liquid: Within functional limits Presentation: Cup;Spoon;Self Fed Other Comments: ~6 oz; single and sequential sips    Puree Puree: Within functional limits Presentation: Self Fed Other Comments: ~4 oz   Solid     Solid: Impaired Presentation: Self Fed Oral Phase Impairments: Impaired mastication Oral Phase Functional Implications: Prolonged oral transit;Oral residue (trace oral residual)     Cherrie Gauze, M.S., [facility] [phone] (ASCOM)  Clearnce Sorrel Marjarie Irion 06/28/2021,12:56 PM

## 2021-06-28 NOTE — H&P (Signed)
NAME:  Heather Peterson, MRN:  [id number], DOB:  1941/05/03, LOS: 1 ADMISSION DATE:  06/27/2021, CONSULTATION DATE:  06/27/2021 REFERRING MD: Dr. Bland Span, CHIEF COMPLAINT: Abdominal Distension   History of Present Illness:  This is a 81 yo female who presented to [facility] ER via EMS on 02/15 with c/o abdominal distension and recurrents falls. Per EMS upon their arrival at pts home pt hypotensive bp 84/48, therefore 500 ml iv fluid bolus administered.  She was also placed on 3L O2 via nasal canula (wears prn home O2).    She previously presented to the ER on 02/14 via EMS with c/o fluid retention in her abdomen and weeping of bilateral lower extremities.  She reported requiring paracentesis x2 over the past month due to recurrent ascites.  She also reported she had been previously scheduled for an outpatient paracentesis on 02/16. She also endorsed a headache, decreased oral intake (onset 02/12), nausea/vomiting, and some shortness of breath.  At that time bp borderline 81/48.  CXR, EKG, CT Head/Cervical Spine were negative.  Lab results: T4, free (Direct) 0.42/hgb 7.4, and ammonia 18.  Pt deemed stable and did not require hospital admission.    ED Course During current ER presentation pt hypotensive bp 79/58 and hypothermic temp 95.8 degrees F rectally.  Pts baseline sbp 85~100 at baseline, which she's on chronic midodrine.  Lab results revealed BUN 50, creatinine 2.53, calcium 8.5, albumin 2.9, BNP 869.8, troponin 28, pct 0.32, lactic acid 1.2, and hgb 6.7.  Pt also hemoccult positive, therefore received 1 unit of pRBC's.  Pt denies taking NSAID's, Goody Powder's, or anticoagulation.  She does take 81 mg of aspirin daily.  GI consulted Dr. Vicente Males recommended starting empiric abx, octreotide, and protonix.  CT Head negative for acute abnormality.  CT Cervical Spine revealed no evidence of acute fracture or traumatic malalignment, however concerned for emphysema and possible mild interstitial edema vs. small  layering right pleural effusion.  Left tibia/fibula xray revealed acute fracture of the proximal metadiaphysis of the fibula.   Orthopedic surgeon Dr. Roland Rack consulted per ED MD with recommendation to place a knee immobilizer for now. PCCM team contacted for ICU admission due to possible need of vasopressor therapy due to hypotension.    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 Sepsis  Cultures:  MRSA PCR 02/15>>negative  Blood x2 02/15>>negative  Resp Panel by RT-PCR (Flu A&B, Covid) 02/15>>negative   Interim History / Subjective:  Pt lethargic, however arousable and able to follow commands.  On O2_0  via nasal canula with O2 sats 94-96% no s/sx of respiratory distress.    Objective   Blood pressure (!) 88/55, pulse 82, temperature 98.4 F (36.9 C), temperature source Bladder, resp. rate 15, height 5'  3" (1.6 m), weight 79.6 kg, SpO2 93 %.        Intake/Output Summary (Last 24 hours) at 06/28/2021 1213 Last data filed at 06/28/2021 1100 Gross per 24 hour  Intake 1118.46 ml  Output 445 ml  Net 673.46 ml   Filed  Weights   06/28/21 0500  Weight: 79.6 kg    Examination: General: acute on chronically ill appearing female, NAD resting in bed HENT: supple, no JVD Lungs: diminished throughout, even, non labored  Cardiovascular: nsr, rrr, no R/G, 2+ radial/1+ distal pulses, 1+ bilateral lower extremity edema with weeping  Abdomen: hypoactive BS x4, distended (ascites), taut  Extremities: moves all extremities Neuro: Awake, oriented, follows commands, PERRLA  GU: temp foley in place draining clear yellow urine   Resolved Hospital Problem list   N/A  Assessment & Plan:   Hypotension secondary to sepsis (possible SBP) Liver cirrhosis, chronic  Possible acute on chronic anemia , hypovolemic - Continuous telemetry monitoring  - Continue scheduled outpatient midodrine; levophed off now - Cortisol level pending to assess for adrenal insufficiency   Chronic combined systolic and diastolic CHF~EF 42% Hx: Pacemaker, Ventricular Fibrillation/Tachycardia s/p Dual Chamber ICD, HTN, and Cardiomyopathy   - Continue outpatient low dose simvastatin; due to hypotension will hold mexitil and torsemide for now  - Outpatient records appear SBP high80s-low100s - No need for pressors if the patient is mentating well at these numbers and normal lactic acid (1.2)  Acute on chronic hypoxic respiratory failure secondary to COPD  Small right pleural effusion along with atelectasis  - Prn supplemental O2 for hypoxia and/or dyspnea  - Maintain O2 sats 88% to 92% - Aggressive pulmonary hygiene   Stage IV CKD AKI  Intake/Output Summary (Last 24 hours) at 06/28/2021 1235 Last data filed at 06/28/2021 1100 Gross per 24 hour  Intake 1118.46 ml  Output 445 ml  Net 673.46 ml  Net IO Since Admission: 1,160.13 mL [06/28/21 1235]  - still producing 0.35cc/kg/hr - Trend BMP, renal indices - Peak creatinine not yet documented  - Replace electrolytes as indicated  - Monitor UOP - Avoid nephrotoxic medications  - Will  consult nephrology if pt becomes oliguric or creatinine is advancing   Possible decompensated liver cirrhosis (NASH) with possible SBP Acute on chronic anemia pt with hx of angiodysplastic lesions  - Serial H&H q6hrs for now  - Will check coags - Transfuse for hgb <8  - GI consulted appreciate input~continue octreotide/protonix gtts and once stable will proceed with EGD  -  Empiric Ceftriaxone for possible SBP   Hypothyroidism  - Resume outpatient synthroid   Multiple myeloma  - Oncology consultation pending  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 01/11/2021 with no significant improvement in back or right leg pain  Acute fracture of the proximal metadiaphysis of the fibula s/p fall  CT Head/Cervical Spine 02/15 negative for acute intracranial abnormality, acute fracture, or traumatic malalignment - Orthopedics consulted appreciate input~recommended knee immobilizer  - Fall precautions  - Once stable will place orders for PT/OT  - Bilateral lower extremities venous doppler ultrasound neg for VTE  - Will resume outpatient gabapentin once mentation improves   Best Practice (right click and "Reselect all SmartList Selections" daily)   Diet/type: NPO DVT prophylaxis: SCD GI prophylaxis: PPI Lines: N/A Foley:  Yes, and it is still needed Code Status:  DNR Last date of multidisciplinary goals of care discussion [06/27/2021]  Labs   CBC: Recent Labs  Lab 06/26/21 1813 06/27/21 0634 06/27/21 1336 06/27/21 2247 06/28/21 0453  WBC 11.0* 8.9  --   --  8.1  NEUTROABS 8.9* 6.4  --   --   --   HGB 7.4* 6.7* 7.9* 9.2* 9.2*  HCT 25.4* 22.9* 25.1* 29.3* 29.4*  MCV 94.8 95.0  --   --  89.6  PLT 231 211  --   --  174     Basic Metabolic Panel: Recent Labs  Lab 06/26/21 1813 06/26/21 1818 06/27/21 0634 06/27/21 1336 06/28/21 0453  NA 135  --  135  --  138  K 4.5  --  4.1  --  4.1  CL 100  --  101  --  104  CO2 24  --   25  --  22  GLUCOSE 117*  --  94  --  126*  BUN 52*  --  50*  --  51*  CREATININE 2.49*  --  2.53*  --  2.60*  CALCIUM 9.2  --  8.5*  --  8.2*  MG  --  2.3  --  2.3 2.4  PHOS  --   --   --  6.6* 6.5*    GFR: Estimated Creatinine Clearance: 17.5 mL/min (A) (by C-G formula based on SCr of 2.6 mg/dL (H)). Recent Labs  Lab 06/26/21 1813 06/27/21 0634 06/27/21 0755 06/27/21 1008 06/28/21 0453  PROCALCITON  --   --  0.32  --   --   WBC 11.0* 8.9  --   --  8.1  LATICACIDVEN 1.1 1.2  --  1.2  --      Liver Function Tests: Recent Labs  Lab 06/26/21 1813 06/27/21 0634  AST 18 17  ALT 9 8  ALKPHOS 114 104  BILITOT 0.6 0.7  PROT 6.5 6.0*  ALBUMIN 3.3* 2.9*    Recent Labs  Lab 06/27/21 0634  LIPASE 49    Recent Labs  Lab 06/26/21 2108  AMMONIA 18     ABG No results found for: PHART, PCO2ART, PO2ART, HCO3, TCO2, ACIDBASEDEF, O2SAT   Coagulation Profile: Recent Labs  Lab 06/27/21 1336  INR 1.5*    Cardiac Enzymes: No results for input(s): CKTOTAL, CKMB, CKMBINDEX, TROPONINI in the last 168 hours.  HbA1C: Hgb A1c MFr Bld  Date/Time Value Ref Range Status  02/14/2021 03:52 AM 7.1 (H) 4.8 - 5.6 % Final    Comment:    (NOTE)         Prediabetes: 5.7 - 6.4         Diabetes: >6.4         Glycemic control for adults with diabetes: <7.0   06/18/2020 05:57 PM 6.0 (H) 4.8 - 5.6 % Final    Comment:    (NOTE) Pre diabetes:          5.7%-6.4%  Diabetes:              >6.4%  Glycemic control for   <7.0% adults with diabetes     CBG: Recent Labs  Lab 06/27/21 1127  GLUCAP 87    Review of Systems:   Unable to assess pt lethargic   Past Medical History:  She,  has a past medical history of Anxiety, Chronic combined systolic and diastolic CHF (congestive heart failure) ([place]), Chronic kidney disease, Coronary artery disease, Depression, Diabetes mellitus without complication ([place]), Diabetes mellitus, type II ([place]), Hypertension, MI (myocardial infarction)  ([place]), Pacemaker, 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[place]), Prolonged Q-T interval on ECG, and  Thyroid disease.   Surgical History:   Past Surgical History:  Procedure Laterality Date   CENTRAL LINE INSERTION  03/11/2017   Procedure: CENTRAL LINE INSERTION;  Surgeon: Leonie Man, MD;  Location: [place] [facility];  Service: Cardiovascular;;   CHOLECYSTECTOMY     COLONOSCOPY WITH PROPOFOL N/A 09/01/2019   Procedure: COLONOSCOPY WITH PROPOFOL;  Surgeon: Toledo, Benay Pike, MD;  Location: [facility] ENDOSCOPY;  Service: Gastroenterology;  Laterality: N/A;   CORONARY STENT INTERVENTION W/IMPELLA N/A 03/11/2017   Procedure: Coronary Stent Intervention w/Impella;  Surgeon: Leonie Man, MD;  Location: [place] [facility];  Service: Cardiovascular;  Laterality: N/A;   ESOPHAGOGASTRODUODENOSCOPY (EGD) WITH PROPOFOL N/A 09/01/2019   Procedure: ESOPHAGOGASTRODUODENOSCOPY (EGD) WITH PROPOFOL;  Surgeon: Toledo, Benay Pike, MD;  Location: [facility] ENDOSCOPY;  Service: Gastroenterology;  Laterality: N/A;   ESOPHAGOGASTRODUODENOSCOPY (EGD) WITH PROPOFOL N/A 09/08/2019   Procedure: ESOPHAGOGASTRODUODENOSCOPY (EGD) WITH PROPOFOL;  Surgeon: [name], MD;  Location: [facility] ENDOSCOPY;  Service: Gastroenterology;  Laterality: N/A;   ESOPHAGOGASTRODUODENOSCOPY (EGD) WITH PROPOFOL N/A 02/15/2021   Procedure: ESOPHAGOGASTRODUODENOSCOPY (EGD) WITH PROPOFOL;  Surgeon: [name], MD;  Location[facility] END[facility]COPY;  Service: Gastroenterology;  Laterality: N/A;   EYE SURGERY     HIP ARTHROPLASTY Right 03/29/2020   Procedure: ARTHROPLASTY BIPOLAR HIP (HEMIARTHROPLASTY);  Surgeon: Corky Mull, MD;  Location: [facility] ORS;  Service: Orthopedics;  Laterality: Right;   INTRAVASCULAR PRESSURE WIRE/FFR STUDY N/A 03/11/2017   Procedure: INTRAVASCULAR PRESSURE WIRE/FFR STUDY;  Surgeon: Leonie Man, MD;  Location: [place] [facility];  Service: Cardiovascular;  Laterality: N/A;   INTRAVASCULAR ULTRASOUND/IVUS N/A 03/11/2017   Procedure:  Intravascular Ultrasound/IVUS;  Surgeon: Leonie Man, MD;  Location: [place] [facility];  Service: Cardiovascular;  Laterality: N/A;   LEFT HEART CATH AND CORONARY ANGIOGRAPHY N/A 03/05/2017   Procedure: LEFT HEART CATH AND CORONARY ANGIOGRAPHY;  Surgeon: Isaias Cowman, MD;  Location: [place] [facility];  Service: Cardiovascular;  Laterality: N/A;   PACEMAKER IMPLANT     pacemaker/defibrillator Left      Social History:   reports that she has been smoking e-cigarettes and cigarettes. She has been smoking an average of .25 packs per day. She has never used smokeless tobacco. She reports that she does not currently use alcohol. She reports current drug use.   Family History:  Her family history includes Depression in her brother, brother, and sister; Heart attack in her father; Hypertension in her father.   Allergies Allergies  Allergen Reactions   Celebrex [Celecoxib] Anaphylaxis   Glipizide Anaphylaxis   Levaquin [Levofloxacin In D5w] Other (See Comments)    Heart arrhthymias   Levofloxacin Other (See Comments) and Palpitations    ICD fired   Lisinopril Swelling    Lip and facial swelling   Sulfa Antibiotics Other (See Comments) and Anaphylaxis    Reaction: unknown   Metformin Other (See Comments)    Gi tolerance    Penicillins Rash and Other (See Comments)    Has patient had a PCN reaction causing immediate rash, facial/tongue/throat swelling, SOB or lightheadedness with hypotension: Unknown Has patient had a PCN reaction causing severe rash involving mucus membranes or skin necrosis: No Has patient had a PCN reaction that required hospitalization: No Has patient had a PCN reaction occurring within the last 10 years: No If all of the above answers are "NO", then may proceed with Cephalosporin use.      Home Medications  Prior to Admission medications   Medication Sig Start Date End Date  Taking? Authorizing Provider  allopurinol (ZYLOPRIM) 300 MG tablet Take 300  mg by mouth daily. 04/23/21   [provider]  aspirin EC 81 MG tablet Take 81 mg by mouth at bedtime.     [provider]  colchicine 0.6 MG tablet Take by mouth.    [provider]  dexamethasone (DECADRON) 4 MG tablet Take 5 tablets (20 mg total) by mouth daily. Take the day after Velcade on days 2,5,9,12. Take with breakfast Patient not taking: Reported on 06/26/2021 11/14/20   Lloyd Huger, MD  docusate sodium (COLACE) 100 MG capsule Take 100 mg by mouth 2 (two) times daily.    [provider]  Ensure Max Protein (ENSURE MAX PROTEIN) LIQD Take 330 mLs (11 oz total) by mouth 2 (two) times daily between meals. 04/03/20   [name], MD  fexofenadine (ALLEGRA) 180 MG tablet Take 180 mg by mouth daily.    [provider]  FLUoxetine (PROZAC) 10 MG capsule Take 10 mg by mouth daily. 06/29/19   [provider]  gabapentin (NEURONTIN) 100 MG capsule TAKE 1 CAPSULE(100 MG) BY MOUTH THREE TIMES DAILY Patient not taking: Reported on 05/09/2021 12/29/20   Lloyd Huger, MD  gabapentin (NEURONTIN) 600 MG tablet Take 600 mg by mouth daily. Patient takes 100 mg QAM, 700 mg QHS 11/07/20   [provider]  hydrocortisone (ANUSOL-HC) 2.5 % rectal cream Apply 1 application topically 2 (two) times daily. Patient not taking: Reported on 05/23/2021 02/12/20   [provider]  Lactulose 20 GM/30ML SOLN Take 30 mLs (20 g total) by mouth in the morning and at bedtime. Patient not taking: Reported on 05/09/2021 06/23/20   Jacquelin Hawking, NP  levothyroxine (SYNTHROID) 50 MCG tablet Take 50 mcg by mouth daily before breakfast. 08/14/20   [provider]  levothyroxine (SYNTHROID) 75 MCG tablet Take 1 tablet (75 mcg total) by mouth daily before breakfast. On Monday, Wednesday and Friday 04/03/20   [name], MD  lidocaine (LIDODERM) 5 % Place 1 patch onto the skin every 12 (twelve) hours. Remove & Discard patch within 12 hours or  as directed by MD 06/26/21   Carrie Mew, MD  mexiletine (MEXITIL) 200 MG capsule Take 1 capsule (200 mg total) by mouth every 12 (twelve) hours. 03/14/17   Chanetta Marshall K, NP  mexiletine (MEXITIL) 200 MG capsule Take by mouth. Patient not taking: Reported on 06/26/2021 05/09/21   [provider]  midodrine (PROAMATINE) 10 MG tablet Take 10 mg by mouth 3 (three) times daily. 06/13/21   [provider]  OXYGEN Inhale 2 L into the lungs continuous.    [provider]  pantoprazole (PROTONIX) 40 MG tablet Take 40 mg by mouth daily.  05/16/19   [provider]  polyethylene glycol (MIRALAX) 17 g packet Take 17 g by mouth daily. Patient not taking: Reported on 05/09/2021 06/18/20   Nance Pear, MD  Probiotic Product (PROBIOTIC DAILY PO) Take 1 tablet by mouth daily. Patient not taking: Reported on 06/26/2021    [provider]  SANTYL ointment Apply topically. Patient not taking: Reported on 05/09/2021 01/01/21   [provider]  simvastatin (ZOCOR) 20 MG tablet Take 20 mg by mouth at bedtime. 05/15/21   [provider]  spironolactone (ALDACTONE) 25 MG tablet Take 0.5 tablets (12.5 mg total) by mouth every other day. Patient not taking: Reported on 06/20/2021 01/03/21   Alisa Graff, FNP  torsemide (DEMADEX) 20 MG tablet Take 40  mg by mouth as needed.    [provider]  traZODone (DESYREL) 50 MG tablet Take 50 mg by mouth at bedtime.    [provider]    //Latonyia Lopata

## 2021-06-29 LAB — BASIC METABOLIC PANEL
Anion gap: 11 (ref 5–15)
BUN: 50 mg/dL — ABNORMAL HIGH (ref 8–23)
CO2: 22 mmol/L (ref 22–32)
Calcium: 8.2 mg/dL — ABNORMAL LOW (ref 8.9–10.3)
Chloride: 105 mmol/L (ref 98–111)
Creatinine, Ser: 2.5 mg/dL — ABNORMAL HIGH (ref 0.44–1.00)
GFR, Estimated: 19 mL/min — ABNORMAL LOW (ref >60)
Glucose, Bld: 121 mg/dL — ABNORMAL HIGH (ref 70–99)
Potassium: 3.7 mmol/L (ref 3.5–5.1)
Sodium: 138 mmol/L (ref 135–145)

## 2021-06-29 LAB — CBC
HCT: 28.2 % — ABNORMAL LOW (ref 36.0–46.0)
Hemoglobin: 8.9 g/dL — ABNORMAL LOW (ref 12.0–15.0)
MCH: 28.7 pg (ref 26.0–34.0)
MCHC: 31.6 g/dL (ref 30.0–36.0)
MCV: 91 fL (ref 80.0–100.0)
Platelets: 149 10*3/uL — ABNORMAL LOW (ref 150–400)
RBC: 3.1 MIL/uL — ABNORMAL LOW (ref 3.87–5.11)
RDW: 21 % — ABNORMAL HIGH (ref 11.5–15.5)
WBC: 5.6 10*3/uL (ref 4.0–10.5)
nRBC: 1.3 % — ABNORMAL HIGH (ref 0.0–0.2)

## 2021-06-29 LAB — MAGNESIUM: Magnesium: 2.5 mg/dL — ABNORMAL HIGH (ref 1.7–2.4)

## 2021-06-29 LAB — CYTOLOGY - NON PAP

## 2021-06-29 MED ORDER — ALBUMIN HUMAN 25 % IV SOLN
75.0000 g | Freq: Once | INTRAVENOUS | Status: AC
  Administered 2021-06-30: 75 g via INTRAVENOUS
  Filled 2021-06-29: qty 300

## 2021-06-29 NOTE — Progress Notes (Signed)
PHARMACY CONSULT NOTE  Pharmacy Consult for Electrolyte Monitoring and Replacement   Recent Labs: Potassium (mmol/L)  Date Value  06/29/2021 3.7   Magnesium (mg/dL)  Date Value  06/29/2021 2.5 (H)   Calcium (mg/dL)  Date Value  06/29/2021 8.2 (L)   Calcium, Total (PTH) (mg/dL)  Date Value  09/07/2019 8.6 (L)   Albumin (g/dL)  Date Value  06/27/2021 2.9 (L)   Phosphorus (mg/dL)  Date Value  06/28/2021 6.5 (H)   Sodium (mmol/L)  Date Value  06/29/2021 138  03/31/2017 140   Corrected Ca 9.4 mg/dL  Assessment: 81 y.o. female with past medical history of CAD, CKD, AICD, cirrhosis, requiring recurrent paracentesis, multiple myeloma, here with fall. Pharmacy is asked to follow and replace electrolytes while in the  CCU.  Goal of Therapy:  Electrolytes WNL  Plan:  No electrolyte replacement warranted for today Recheck electrolytes in am  Dallie Piles ,PharmD Clinical Pharmacist 06/29/2021 7:11 AM

## 2021-06-29 NOTE — TOC Progression Note (Signed)
Transition of Care [facility]) - Progression Note    Patient Details  Name: Heather Peterson MRN: [id number] Date of Birth: 06-19-1940  Transition of [facility] CM/SW Contact  Shelbie Hutching, RN Phone Number: 06/29/2021, 3:12 PM  Clinical Narrative:    RNCM met with patient and family at the bedside today, husband,Rodney,  daughter, [name], and son, Herbie [place] all present.  Reviewed all options at this time, discussed home with hospice and additional home equipment being set up like hoyer lift and wheelchair, and Plerx drain placement and hospice nursing to come everyday- [facility] this would be possible.  Discussed home with home health care continued through [facility].  Discussed hiring private caregivers to come in and assist with care.  Discussed short term rehab at a skilled nursing facility.   After reviewing all options at this time patient and family would like to proceed with short term rehab. Preference is [facility], bed request sent out to [facility].      Expected Discharge Plan: [place] Barriers to Discharge: Continued Medical Work up  Expected Discharge Plan and Services Expected Discharge Plan: [place]   Discharge Planning Services: CM Consult Post Acute Care Choice: [place] Living arrangements for the past 2 months: Single Family Home                 DME Arranged: N/A DME Agency: NA       HH Arranged: NA HH Agency: NA         Social Determinants of Health (SDOH) Interventions    Readmission Risk Interventions Readmission Risk Prevention Plan 06/28/2021 02/20/2021 02/16/2021  Transportation Screening Complete Complete Complete  Medication Review Press photographer) Complete Complete Complete  PCP or Specialist appointment within 3-5 days of discharge Complete - Complete  HRI or Home Care Consult Complete Complete Complete  SW Recovery Care/Counseling Consult Complete Complete Complete  Palliative Care Screening  Complete Not Applicable Not [place] Patient Refused Patient Refused Complete  Some recent data might be hidden

## 2021-06-29 NOTE — Progress Notes (Signed)
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 Per EMS upon their arrival at pts home pt hypotensive bp 84/48, therefore 500 ml iv fluid bolus administered.  She was also placed on 3L O2 via nasal canula (wears prn home O2).    In the ED, pt was found hemoccult positive, therefore received 1 unit of pRBC's.  GI consulted Dr. Vicente Males recommended starting empiric abx, octreotide, and protonix.  CT Head negative for acute abnormality.  CT Cervical Spine revealed no evidence of acute fracture or traumatic malalignment, however concerned for emphysema and possible mild interstitial edema vs. small layering right pleural effusion.  Left tibia/fibula xray revealed acute fracture of the proximal metadiaphysis of the fibula.  Orthopedic surgeon Dr. Roland Rack consulted per ED MD with recommendation to place a knee immobilizer for now. PCCM team contacted for ICU admission due to possible need of vasopressor therapy due to hypotension.  Pt was on Levo for just 1 day and was weaned off and transferred to [facility] on 2/17.  Hypotension, chronic, secondary to  Liver cirrhosis, chronic  Possible acute on chronic anemia , hypovolemic --Outpatient records appear SBP high 80s-low 100s. --briefly on Levo for a day.   --cont home midodrine 10 mg TID  Sepsis, ruled out --did not meet criteria  SBP --Ascitic fluid tap 3.2L showed a total cell  count of 1600 with a neutrophil count of 65% which meets criteria for SBP. Culture so far negative.  --GI consulted --cont ceftriaxone --IV albumin and octreotide, per GI --will require lifelong prophylaxis for SBP, per GI   Decompensated liver cirrhosis  --GI consulted, discussed with family the high mortality   Acute toxic encephalopathy 2/2 infection --treat SBP  Acute closed nondisplaced left proximal fibular fracture --ortho consulted, Dr. Roland Rack --knee immobilizer applied --weight-bear as tolerated on the left leg --follow-up in ortho office in 5 to 6 weeks for re-evaluation. 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  Cr ~2.2 in the past month.   Acute on chronic anemia with hx of angiodysplastic lesions  - Hgb 8-9's - GI consulted appreciate input~continue octreotide/protonix gtts and once stable will proceed with EGD  --monitor Hgb and transfuse to keep Hgb >8   Hypothyroidism  - cont home Synthroid   Multiple myeloma  - Oncology consulted, Dr. Grayland Ormond --Patient has not received treatment since February 09, 2021.  No further treatments are planned at this time, per onc. --Recommend palliative care consult.   Multiple falls pt with hx of lumbar stenosis with  neurogenic claudication s/p right L4-5 TFESI 01/11/2021 with no significant improvement in back or right leg pain  --PT/OT   DVT prophylaxis: SCD/Compression stockings Code Status: DNR  Family Communication: son updated at bedside today  Level of care:  Stepdown Dispo:   The patient is from: home Anticipated d/c is to: SNF rehab Anticipated d/c date is: >3 days Patient currently is not medically ready to d/c due to: SBP on IV abx   Subjective and Interval History:  Pt was awake but not engaged in family discussion about her care and disposition.     Objective: Vitals:   06/29/21 1100 06/29/21 1200 06/29/21 1300 06/29/21 1359  BP: (!) 80/57 (!) 98/58 (!) 95/59 (!) 95/59  Pulse: 79 78 79 62  Resp: 13 15 15 11   Temp:  99 F (37.2 C)    TempSrc:  Axillary    SpO2: 90% 93% 92% 93%  Weight:      Height:        Intake/Output Summary (Last 24 hours) at 06/29/2021 1601 Last data filed at 06/29/2021 1500 Gross per 24 hour  Intake 1619.49 ml  Output 670 ml  Net 949.49 ml   Filed Weights   06/28/21 0500 06/29/21 0500  Weight: 79.6 kg 77.8 kg    Examination:   Constitutional: NAD, alert, frail  HEENT: conjunctivae and lids normal, EOMI CV: No cyanosis.   RESP: normal respiratory effort, on RA Neuro: II - XII grossly intact.     Data Reviewed: I have personally reviewed following labs and imaging studies  CBC: Recent Labs  Lab 06/26/21 1813 06/27/21 0634 06/27/21 1336 06/27/21 2247 06/28/21 0453 06/29/21 0354  WBC 11.0* 8.9  --   --  8.1 5.6  NEUTROABS 8.9* 6.4  --   --   --   --   HGB 7.4* 6.7* 7.9* 9.2* 9.2* 8.9*  HCT 25.4* 22.9* 25.1* 29.3* 29.4* 28.2*  MCV 94.8 95.0  --   --  89.6 91.0  PLT 231 211  --   --  174 616*   Basic Metabolic Panel: Recent Labs  Lab 06/26/21 1813 06/26/21 1818 06/27/21 0634 06/27/21 1336 06/28/21 0453 06/29/21 0354  NA 135  --  135  --  138 138  K 4.5  --  4.1  --  4.1 3.7  CL 100  --  101  --  104 105  CO2 24  --  25  --  22 22  GLUCOSE 117*  --  94  --  126* 121*  BUN 52*  --  50*  --  51* 50*  CREATININE 2.49*  --  2.53*  --  2.60* 2.50*  CALCIUM 9.2  --  8.5*  --  8.2* 8.2*  MG  --  2.3  --  2.3 2.4 2.5*  PHOS  --   --   --  6.6* 6.5*  --    GFR: Estimated  Creatinine Clearance: 18 mL/min (A) (by C-G formula based on SCr of 2.5 mg/dL (H)). Liver Function Tests: Recent Labs  Lab 06/26/21 1813 06/27/21 0634  AST 18 17  ALT 9 8  ALKPHOS 114 104  BILITOT 0.6 0.7  PROT 6.5 6.0*  ALBUMIN 3.3* 2.9*   Recent Labs  Lab 06/27/21 0634  LIPASE 49   Recent Labs  Lab 06/26/21 2108  AMMONIA 18   Coagulation Profile: Recent Labs  Lab 06/27/21 1336  INR 1.5*   Cardiac Enzymes: No results for input(s): CKTOTAL, CKMB, CKMBINDEX, TROPONINI in  the last 168 hours. BNP (last 3 results) No results for input(s): PROBNP in the last 8760 hours. HbA1C: No results for input(s): HGBA1C in the last 72 hours. CBG: Recent Labs  Lab 06/27/21 1127  GLUCAP 87   Lipid Profile: No results for input(s): CHOL, HDL, LDLCALC, TRIG, CHOLHDL, LDLDIRECT in the last 72 hours. Thyroid Function Tests: Recent Labs    06/26/21 1818 06/27/21 0755  TSH 3.435 3.365  FREET4 0.42*  --    Anemia Panel: Recent Labs    06/27/21 2247  VITAMINB12 614   Sepsis Labs: Recent Labs  Lab 06/26/21 1813 06/27/21 0634 06/27/21 0755 06/27/21 1008  PROCALCITON  --   --  0.32  --   LATICACIDVEN 1.1 1.2  --  1.2    Recent Results (from the past 240 hour(s))  Resp Panel by RT-PCR (Flu A&B, Covid) Nasopharyngeal Swab     Status: None   Collection Time: 06/27/21  7:55 AM   Specimen: Nasopharyngeal Swab; Nasopharyngeal(NP) swabs in vial transport medium  Result Value Ref Range Status   SARS Coronavirus 2 by RT PCR NEGATIVE NEGATIVE Final    Comment: (NOTE) SARS-CoV-2 target nucleic acids are NOT DETECTED.  The SARS-CoV-2 RNA is generally detectable in upper respiratory specimens during the acute phase of infection. The lowest concentration of SARS-CoV-2 viral copies this assay can detect is 138 copies/mL. A negative result does not preclude SARS-Cov-2 infection and should not be used as the sole basis for treatment or other patient management decisions. A negative  result may occur with  improper specimen collection/handling, submission of specimen other than nasopharyngeal swab, presence of viral mutation(s) within the areas targeted by this assay, and inadequate number of viral copies(<138 copies/mL). A negative result must be combined with clinical observations, patient history, and epidemiological information. The expected result is Negative.  Fact Sheet for Patients:  EntrepreneurPulse.com.au  Fact Sheet for Healthcare Providers:  IncredibleEmployment.be  This test is no t yet approved or cleared by the [place] FDA and  has been authorized for detection and/or diagnosis of SARS-CoV-2 by FDA under an Emergency Use Authorization (EUA). This EUA will remain  in effect (meaning this test can be used) for the duration of the COVID-19 declaration under Section 564(b)(1) of the Act, 21 U.S.C.section 360bbb-3(b)(1), unless the authorization is terminated  or revoked sooner.       Influenza A by PCR NEGATIVE NEGATIVE Final   Influenza B by PCR NEGATIVE NEGATIVE Final    Comment: (NOTE) The Xpert Xpress SARS-CoV-2/FLU/RSV plus assay is intended as an aid in the diagnosis of influenza from Nasopharyngeal swab specimens and should not be used as a sole basis for treatment. Nasal washings and aspirates are unacceptable for Xpert Xpress SARS-CoV-2/FLU/RSV testing.  Fact Sheet for Patients: EntrepreneurPulse.com.au  Fact Sheet for Healthcare Providers: IncredibleEmployment.be  This test is not yet approved or cleared by the [place] FDA and has been authorized for detection and/or diagnosis of SARS-CoV-2 by FDA under an Emergency Use Authorization (EUA). This EUA will remain in effect (meaning this test can be used) for the duration of the COVID-19 declaration under Section 564(b)(1) of the Act, 21 U.S.C. section 360bbb-3(b)(1), unless the authorization is  terminated or revoked.  Performed at [facility], [address] [place], [place] [address]   Blood culture (single)     Status: None (Preliminary result)   Collection Time: 06/27/21  7:55 AM   Specimen: BLOOD  Result Value Ref Range Status   Specimen Description  BLOOD BLOOD RIGHT FOREARM  Final   Special Requests   Final    BOTTLES DRAWN AEROBIC AND ANAEROBIC Blood Culture results may not be optimal due to an excessive volume of blood received in culture bottles   Culture   Final    NO GROWTH 2 DAYS Performed at [facility], [address] [place], [place] [address]    Report Status PENDING  Incomplete  Blood culture (single)     Status: None (Preliminary result)   Collection Time: 06/27/21 10:08 AM   Specimen: BLOOD LEFT HAND  Result Value Ref Range Status   Specimen Description   Final    BLOOD LEFT HAND Performed at [facility], [address] [place], [place] [address]    Special Requests   Final    BOTTLES DRAWN AEROBIC AND ANAEROBIC Blood Culture adequate volume Performed at [facility], [address] [place], [place] [address]    Culture  Setup Time   Final    Organism ID to follow [place] CRITICAL RESULT CALLED TO, READ BACK BY AND VERIFIED WITH: KRISTIN MILLER 06/28/21 0741 BY [email or web address] Performed at [facility], [address] [place], [place] [address]    Culture   Final    GRAM POSITIVE COCCI IDENTIFICATION AND SUSCEPTIBILITIES TO FOLLOW Performed at [facility], [place] [address] [place], [place] [address]    Report Status PENDING  Incomplete  Blood Culture ID Panel (Reflexed)     Status: Abnormal   Collection Time: 06/27/21 10:08 AM  Result Value Ref Range Status   Enterococcus faecalis NOT DETECTED NOT DETECTED Final   Enterococcus Faecium NOT DETECTED NOT DETECTED Final   Listeria monocytogenes NOT DETECTED NOT DETECTED Final   Staphylococcus species NOT  DETECTED NOT DETECTED Final   Staphylococcus aureus (BCID) NOT DETECTED NOT DETECTED Final   Staphylococcus epidermidis NOT DETECTED NOT DETECTED Final   Staphylococcus lugdunensis NOT DETECTED NOT DETECTED Final   Streptococcus species DETECTED (A) NOT DETECTED Final    Comment: Not Enterococcus species, Streptococcus agalactiae, Streptococcus pyogenes, or Streptococcus pneumoniae. CRITICAL RESULT CALLED TO, READ BACK BY AND VERIFIED WITH: KRISTIN MILLER 06/28/21 0741 MW    Streptococcus agalactiae NOT DETECTED NOT DETECTED Final   Streptococcus pneumoniae NOT DETECTED NOT DETECTED Final   Streptococcus pyogenes NOT DETECTED NOT DETECTED Final   A.calcoaceticus-baumannii NOT DETECTED NOT DETECTED Final   Bacteroides fragilis NOT DETECTED NOT DETECTED Final   Enterobacterales NOT DETECTED NOT DETECTED Final   Enterobacter cloacae complex NOT DETECTED NOT DETECTED Final   Escherichia coli NOT DETECTED NOT DETECTED Final   Klebsiella aerogenes NOT DETECTED NOT DETECTED Final   Klebsiella oxytoca NOT DETECTED NOT DETECTED Final   Klebsiella pneumoniae NOT DETECTED NOT DETECTED Final   Proteus species NOT DETECTED NOT DETECTED Final   Salmonella species NOT DETECTED NOT DETECTED Final   Serratia marcescens NOT DETECTED NOT DETECTED Final   Haemophilus influenzae NOT DETECTED NOT DETECTED Final   Neisseria meningitidis NOT DETECTED NOT DETECTED Final   Pseudomonas aeruginosa NOT DETECTED NOT DETECTED Final   Stenotrophomonas maltophilia NOT DETECTED NOT DETECTED Final   Candida albicans NOT DETECTED NOT DETECTED Final   Candida auris NOT DETECTED NOT DETECTED Final   Candida glabrata NOT DETECTED NOT DETECTED Final   Candida krusei NOT DETECTED NOT DETECTED Final   Candida parapsilosis NOT DETECTED NOT DETECTED Final   Candida tropicalis NOT DETECTED NOT DETECTED Final   Cryptococcus neoformans/gattii NOT DETECTED  NOT DETECTED Final    Comment: Performed at [facility], [address] [place], [place] [address]  MRSA Next Gen by PCR, Nasal     Status: None   Collection Time: 06/27/21 11:30 AM   Specimen: Nasal Mucosa; Nasal Swab  Result Value Ref Range Status   MRSA by PCR Next Gen NOT DETECTED NOT DETECTED Final    Comment: (NOTE) The GeneXpert MRSA Assay (FDA approved for NASAL specimens only), is one component of a comprehensive MRSA colonization surveillance program. It is not intended to diagnose MRSA infection nor to guide or monitor treatment for MRSA infections. Test performance is not FDA approved in patients less than 66 years old. Performed at [facility], [address] [place], [place] [address]   Body fluid culture w Gram Stain     Status: None (Preliminary result)   Collection Time: 06/27/21  3:00 PM   Specimen: PATH Cytology Peritoneal fluid  Result Value Ref Range Status   Specimen Description   Final    PERITONEAL Performed at [facility], [address] [place], [place] [address]    Special Requests   Final    NONE Performed at [facility], [address] [place], [place] [address]    Gram Stain   Final    RARE WBC PRESENT,BOTH PMN AND MONONUCLEAR NO ORGANISMS SEEN    Culture   Final    NO GROWTH 2 DAYS Performed at [facility], [place] [address] [place], [place] [address]    Report Status PENDING  Incomplete      Radiology Studies: CT ABDOMEN PELVIS WO CONTRAST  Result Date: 06/27/2021 CLINICAL DATA:  Abdominal distension.  Sepsis.  Hypotension. EXAM: CT ABDOMEN AND PELVIS WITHOUT CONTRAST TECHNIQUE: Multidetector CT imaging of the abdomen and pelvis was performed following the standard protocol without IV contrast. RADIATION DOSE REDUCTION: This exam was performed according to the departmental dose-optimization program which includes automated exposure control, adjustment of the mA and/or kV according to patient size and/or use of iterative reconstruction technique. COMPARISON:   06/18/2020 FINDINGS: Lower chest: Heart is markedly enlarged. Small right pleural effusion noted. Bibasilar atelectasis evident. Hepatobiliary: Nodular liver contour is compatible with cirrhosis. Gallbladder surgically absent. No intrahepatic or extrahepatic biliary dilation. Pancreas: No focal mass lesion. No dilatation of the main duct. No intraparenchymal cyst. No peripancreatic edema. Spleen: No splenomegaly. No focal mass lesion. Adrenals/Urinary Tract: Stable bilateral adrenal adenomas. Cortical scarring noted lower pole right kidney with punctate nonobstructing stones in the interpolar region. Stable 6 mm stone lower pole left kidney with punctate nonobstructing stone interpolar left kidney. No evidence for hydroureter. Bladder is obscured by beam hardening artifact from right hip replacement but there appears to be of Foley catheter in place in gas in the bladder lumen is compatible with the instrumentation. Stomach/Bowel: Stomach is nondistended. Duodenum is normally positioned as is the ligament of Treitz. No small bowel wall thickening. No small bowel dilatation. No gross colonic mass. No colonic wall thickening. Diverticular changes are noted in the left colon without evidence of diverticulitis. Vascular/Lymphatic: There is advanced atherosclerotic calcification of the abdominal aorta without aneurysm. There is no gastrohepatic or hepatoduodenal ligament lymphadenopathy. No retroperitoneal or mesenteric lymphadenopathy. No pelvic sidewall lymphadenopathy. Reproductive: Inferior pelvis obscured by beam hardening artifact. Lobular soft tissue in the upper central pelvis may be related to the uterus/ovaries but cannot be definitively characterized. Other: Moderate volume abdominopelvic ascites. Musculoskeletal: Status post right hip replacement No worrisome lytic  or sclerotic osseous abnormality. IMPRESSION: 1. No acute findings in the abdomen or pelvis. 2. Nodular liver contour compatible with cirrhosis.  3. Moderate volume abdominopelvic ascites. 4. Stable bilateral adrenal adenomas. 5. Bilateral nonobstructing renal stones. 6. Lobular soft tissue in the upper central pelvis may be related to the uterus/ovaries but cannot be definitively characterized and has been incompletely visualized. Pelvic ultrasound recommended to further evaluate. 7. Cardiomegaly with small right pleural effusion. 8. Aortic Atherosclerosis (ICD10-I70.0). Electronically Signed   By: Misty Stanley M.D.   On: 06/27/2021 16:22     Scheduled Meds:  Chlorhexidine Gluconate Cloth  6 each Topical Daily   FLUoxetine  10 mg Oral Daily   levothyroxine  50 mcg Oral Q48H   levothyroxine  75 mcg Oral Q48H   lidocaine  1 patch Transdermal q1600   loratadine  10 mg Oral Daily   mouth rinse  15 mL Mouth Rinse BID   midodrine  10 mg Oral TID   simvastatin  20 mg Oral QHS   Continuous Infusions:  sodium chloride Stopped (06/27/21 0819)   sodium chloride Stopped (06/27/21 2000)   albumin human 25 g (06/29/21 0953)   [START ON 06/30/2021] albumin human     cefTRIAXone (ROCEPHIN)  IV 2 g (06/29/21 0900)   octreotide  (SANDOSTATIN)    IV infusion 50 mcg/hr (06/29/21 0919)     LOS: 2 days     Enzo Bi, MD [facility] If 7PM-7AM, please contact night-coverage 06/29/2021, 4:01 PM

## 2021-06-29 NOTE — Evaluation (Signed)
Physical Therapy Evaluation Patient Details Name: Heather Peterson MRN: [id number] DOB: 22-Jun-1940 Today's Date: 06/29/2021  History of Present Illness  Heather Peterson is a 81 y.o. 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  Decompensated liver cirrhosis with possible SBP and associated hepatic encephalopathy.  She also has bacteremia with positive blood cultures.Ascitic fluid tap yesterday showed a total cell count of 1600 with a neutrophil count of 65% which meets criteria for SBP. Culture so far negative.   Denies any hematemesis or melena.  She has had a closed non displaced  fibula fracture questionable blood loss.  When I went in to see her she was quite confused and I am concerned she has overt encephalopathy secondary to the infection.  Her blood pressure still in the hypotensive range.  She is on oxygen.No evidence of GI bleed. Hb stable  Clinical Impression  The pt presents this session with lethargy and decreased BP being the main limiting factor for mobility. Per her baseline vitals she presented with increased risk of orthostatic hTN with BP soft. The pt was able to participate in bed level exercise. She demonstrates limited ankle ROM bilaterally along with limited LLE mobility overall. Family educated on need for floating of heels to assist with reducing risk of heel ulcer and initiation of ankle pumps for circulation. At this time the pt would benefit from SNF for rehabilitation.        Recommendations for follow up therapy are one component of a multi-disciplinary discharge planning process, led by the attending physician.  Recommendations may be updated based on patient status, additional  functional criteria and insurance authorization.  Follow Up Recommendations Skilled nursing-short term rehab (<3 hours/day)    Assistance Recommended at Discharge Frequent or constant Supervision/Assistance  Patient can return home with the following  Two people to help with walking and/or transfers;Two people to help with bathing/dressing/bathroom;Direct supervision/assist for medications management;Direct supervision/assist for financial management;Assist for transportation;Help with stairs or ramp for entrance    Equipment Recommendations None recommended by PT  Recommendations for Other Services       Functional Status Assessment Patient has had a recent decline in their functional status and/or demonstrates limited ability to make significant improvements in function in a reasonable and predictable amount of time     Precautions / Restrictions Precautions Precautions: Fall Required Braces or Orthoses: Knee Immobilizer - Left Knee Immobilizer - Left: On except when in CPM Restrictions Weight Bearing Restrictions: No LLE Weight Bearing: Weight bearing as tolerated      Mobility  Bed Mobility Overal bed mobility: Needs Assistance             General bed mobility comments: Bed Mobility not assessed 2/2 cognition and pain, however assume that at this time the pt is dependent/max A for all mobility.    Transfers Overall transfer level: Needs assistance                      Ambulation/Gait                  Stairs            Wheelchair Mobility    Modified Rankin (Stroke Patients Only)  Balance                                             Pertinent Vitals/Pain Pain Assessment Pain Assessment: Faces Faces Pain Scale: Hurts little more Pain Descriptors / Indicators: Sore Pain Intervention(s): Patient requesting pain meds-RN notified, Limited activity within patient's tolerance    Home Living Family/patient  expects to be discharged to:: Private residence Living Arrangements: Children;Spouse/significant other Available Help at Discharge: Family;Available 24 hours/day Type of Home: House Home Access: Ramped entrance       Home Layout: One level Home Equipment: Rollator (4 wheels);Wheelchair - manual;BSC/3in1;Hospital bed      Prior Function Prior Level of Function : Needs assist;History of Falls (last six months) (x6 falls in the last 3 months)  Cognitive Assist : Mobility (cognitive) Mobility (Cognitive): Set up cues   Physical Assist : Mobility (physical) Mobility (physical): Transfers;Gait   Mobility Comments: rollator for mobility       Hand Dominance   Dominant Hand: Right    Extremity/Trunk Assessment   Upper Extremity Assessment Upper Extremity Assessment: Generalized weakness    Lower Extremity Assessment Lower Extremity Assessment: Generalized weakness;LLE deficits/detail;RLE deficits/detail RLE Sensation: history of peripheral neuropathy LLE Deficits / Details: Full knee active extension, limited passive and active knee flexion; greatly limited 2/2 pain. LLE Sensation: history of peripheral neuropathy       Communication   Communication: No difficulties  Cognition Arousal/Alertness: Lethargic Behavior During Therapy: Flat affect Overall Cognitive Status: Impaired/Different from baseline Area of Impairment: Orientation, Memory                 Orientation Level: Person   Memory: Decreased recall of precautions         General Comments: Oriented to self only.        General Comments      Exercises General Exercises - Lower Extremity Ankle Circles/Pumps: Both, 10 reps, Limitations Ankle Circles/Pumps Limitations: Limited L ankle dorsiflexion Quad Sets: Left, 10 reps (with 3 sec hold) Heel Slides: AAROM, 5 reps, Left, Limitations Heel Slides Limitations: Limited 2/2 pain; KI removed for AAROM Other Exercises Other Exercises: Educated the  family on the need to perform ankle pumps throughout the day d/t increased risk of DVT Other Exercises: Educated family on heel elevation; with PMHx significant for peripheral neuropathy pt is at increased risk for pressure ulcer.   Assessment/Plan    PT Assessment Patient needs continued PT services  PT Problem List Decreased strength;Decreased mobility;Decreased range of motion;Decreased activity tolerance;Impaired sensation;Decreased skin integrity       PT Treatment Interventions Gait training;Balance training;Functional mobility training;Therapeutic exercise;Therapeutic activities;Patient/family education    PT Goals (Current goals can be found in the Care Plan section)  Acute Rehab PT Goals Patient Stated Goal: Start moving better PT Goal Formulation: With patient Time For Goal Achievement: 07/13/21 Potential to Achieve Goals: Good    Frequency Min 2X/week     Co-evaluation               AM-PAC PT "6 Clicks" Mobility  Outcome Measure Help needed turning from your back to your side while in a flat bed without using bedrails?: A Lot Help needed moving from lying on your back to sitting on the side of a flat bed without using bedrails?: A Lot Help needed moving to and from a bed to a chair (including a wheelchair)?:  Total Help needed standing up from a chair using your arms (e.g., wheelchair or bedside chair)?: Total Help needed to walk in hospital room?: Total Help needed climbing 3-5 steps with a railing? : Total 6 Click Score: 8    End of Session Equipment Utilized During Treatment: Left knee immobilizer Activity Tolerance: Patient limited by lethargy;Patient limited by pain Patient left: in bed;with family/visitor present;with nursing/sitter in room;with call bell/phone within reach;with bed alarm set Nurse Communication: Mobility status;Patient requests pain meds;Precautions PT Visit Diagnosis: Difficulty in walking, not elsewhere classified (R26.2);Muscle  weakness (generalized) (M62.81);Repeated falls (R29.6);History of falling (Z91.81)    Time: 6016-5800 PT Time Calculation (min) (ACUTE ONLY): 24 min   Charges:   PT Evaluation $PT Eval Moderate Complexity: 1 Mod PT Treatments $Therapeutic Exercise: 8-22 mins        2:58 PM, 06/29/21 Shalan Neault A. Saverio Danker PT, DPT Physical Therapist - [facility]   Carrina Schoenberger A Bristyn Kulesza 06/29/2021, 2:54 PM

## 2021-06-29 NOTE — Evaluation (Signed)
Occupational Therapy Evaluation Patient Details Name: Heather Peterson MRN: [id number] DOB: 1941/03/06 Today's Date: 06/29/2021   History of Present Illness Heather Peterson is a 81 y.o. 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  Decompensated liver cirrhosis with possible SBP and associated hepatic encephalopathy.  She also has bacteremia with positive blood cultures.Ascitic fluid tap yesterday showed a total cell count of 1600 with a neutrophil count of 65% which meets criteria for SBP. Culture so far negative.   Denies any hematemesis or melena.  She has had a closed non displaced  fibula fracture questionable blood loss.  When I went in to see her she was quite confused and I am concerned she has overt encephalopathy secondary to the infection.  Her blood pressure still in the hypotensive range.  She is on oxygen.No evidence of GI bleed. Hb stable   Clinical Impression   Pt seen for OT evaluation this date in setting of acute hospitalization s/p fall. She is generally confused, but chart review reports use of AD and several recent falls. She reports being able to perform self care at baseline. She presents this date with L LE pain as well as generalized deconditioning. She currently requires: MIN A for seated UB, MAX A for seated LB, MOD A for ADL transfers with RW and MOD A with moderate tactile and verbal cues to weight shift. Pt is left with all needs met and in reach while seated in recliner and RN was present to witness mobilization. Will continue to follow acutely. Recommending STR f/u.      Recommendations for follow up therapy are one component of a multi-disciplinary discharge planning process, led  by the attending physician.  Recommendations may be updated based on patient status, additional functional criteria and insurance authorization.   Follow Up Recommendations  Skilled nursing-short term rehab (<3 hours/day)    Assistance Recommended at Discharge Frequent or constant Supervision/Assistance  Patient can return home with the following A lot of help with walking and/or transfers;A lot of help with bathing/dressing/bathroom;Direct supervision/assist for medications management;Direct supervision/assist for financial management;Assistance with cooking/housework;Assist for transportation;Help with stairs or ramp for entrance    Functional Status Assessment  Patient has had a recent decline in their functional status and demonstrates the ability to make significant improvements in function in a reasonable and predictable amount of time.  Equipment Recommendations  BSC/3in1;Tub/shower seat;Other (comment) (2ww)    Recommendations for Other Services       Precautions / Restrictions Precautions Precautions: Fall Required Braces or Orthoses: Knee Immobilizer - Left Knee Immobilizer - Left: On except when in CPM Restrictions Weight Bearing Restrictions: No LLE Weight Bearing: Weight bearing as tolerated      Mobility Bed Mobility Overal bed mobility: Needs Assistance Bed Mobility: Supine to Sit     Supine to sit: Mod assist, HOB elevated     General bed mobility comments: increased time, cues    Transfers Overall transfer level: Needs assistance Equipment used: Rolling walker (2 wheels) Transfers: Sit to/from Stand, Bed to chair/wheelchair/BSC Sit to Stand: Mod assist     Step pivot transfers: Mod assist     General transfer comment: to recliner with increaed time, multimodal cues for weight shift and use of  RW      Balance Overall balance assessment: Needs assistance   Sitting balance-Leahy Scale: Good     Standing balance support: Bilateral upper extremity  supported Standing balance-Leahy Scale: Poor Standing balance comment: MOD A for all aspects of standing balance                           ADL either performed or assessed with clinical judgement   ADL                                         General ADL Comments: MIN A for seated UB, MAX A for seated LB, MOD A for ADL transfers with RW and MOD A with moderate tactile and verbal cues to weight shift.     Vision   Additional Comments: generally able to track when visually attending, btu dificult to formally assess d/t cognition     Perception     Praxis      Pertinent Vitals/Pain Pain Assessment Pain Assessment: Faces Faces Pain Scale: Hurts little more Pain Location: L LE Pain Descriptors / Indicators: Sore Pain Intervention(s): Monitored during session, Limited activity within patient's tolerance, Repositioned     Hand Dominance Right   Extremity/Trunk Assessment Upper Extremity Assessment Upper Extremity Assessment: Generalized weakness   Lower Extremity Assessment Lower Extremity Assessment: Generalized weakness;LLE deficits/detail RLE Sensation: history of peripheral neuropathy LLE Deficits / Details: limited for LB ADLs d/t immobilization as well as pain LLE: Unable to fully assess due to pain LLE Sensation: history of peripheral neuropathy       Communication Communication Communication: No difficulties   Cognition Arousal/Alertness: Lethargic Behavior During Therapy: Flat affect Overall Cognitive Status: Impaired/Different from baseline Area of Impairment: Orientation, Memory, Following commands, Safety/judgement, Problem solving                 Orientation Level: Disoriented to, Place, Time, Situation   Memory: Decreased recall of precautions Following Commands: Follows one step commands with increased time Safety/Judgement: Decreased awareness of safety   Problem Solving: Slow processing, Decreased initiation,  Difficulty sequencing, Requires tactile cues General Comments: generally drwosy throughout session, but able to follow commands. SIGNIFICANT processing time     General Comments       Exercises Other Exercises Other Exercises: Ed with pt re: importance of OOB and role of OT, poor carryover/retention/reception d/t drwosiness from medication   Shoulder Instructions      Home Living Family/patient expects to be discharged to:: Private residence Living Arrangements: Children;Spouse/significant other Available Help at Discharge: Family;Available 24 hours/day Type of Home: House Home Access: Ramped entrance     Home Layout: One level         Bathroom Toilet: Handicapped height Bathroom Accessibility: Yes   Home Equipment: Rollator (4 wheels);Wheelchair - manual;BSC/3in1;Hospital bed          Prior Functioning/Environment Prior Level of Function : Needs assist;History of Falls (last six months) (6 falls in 3 months)  Cognitive Assist : Mobility (cognitive) Mobility (Cognitive): Set up cues   Physical Assist : Mobility (physical) Mobility (physical): Transfers;Gait   Mobility Comments: rollator for mobility          OT Problem List: Decreased strength;Decreased activity tolerance      OT Treatment/Interventions: Self-care/ADL training;Therapeutic activities;Therapeutic exercise    OT Goals(Current goals can be found in the care plan section)  Acute Rehab OT Goals Patient Stated Goal: to get up and move OT Goal Formulation: With patient Time For Goal Achievement: 07/13/21 Potential to Achieve Goals: Good ADL Goals Pt Will Perform Lower Body Dressing: with mod assist;with adaptive equipment;sit to/from stand Pt Will Transfer to Toilet: with min assist;with min guard assist Pt Will Perform Toileting - Clothing Manipulation and hygiene: with min assist;with min guard assist  OT Frequency: Min 2X/week    Co-evaluation              AM-PAC OT "6 Clicks" Daily  Activity     Outcome Measure Help from another person eating meals?: None Help from another person taking care of personal grooming?: A Little Help from another person toileting, which includes using toliet, bedpan, or urinal?: A Lot Help from another person bathing (including washing, rinsing, drying)?: A Lot Help from another person to put on and taking off regular upper body clothing?: A Little Help from another person to put on and taking off regular lower body clothing?: A Lot 6 Click Score: 16   End of Session Equipment Utilized During Treatment: Rolling walker (2 wheels);Oxygen (regular nasal cannula, requires for mobility or drops in low 80s) Nurse Communication: Mobility status  Activity Tolerance: Patient tolerated treatment well Patient left: in chair;with call bell/phone within reach  OT Visit Diagnosis: Unsteadiness on feet (R26.81);Muscle weakness (generalized) (M62.81)                Time: 7867-6720 OT Time Calculation (min): 31 min Charges:  OT General Charges $OT Visit: 1 Visit OT Evaluation $OT Eval Moderate Complexity: 1 Mod OT Treatments $Therapeutic Activity: 8-22 mins  Gerrianne Scale, MS, OTR/L ascom [phone] 06/29/21, 5:15 PM

## 2021-06-29 NOTE — NC FL2 (Signed)
[place] LEVEL OF CARE SCREENING TOOL     IDENTIFICATION  Patient Name: Heather Peterson Birthdate: 11/27/1940 Sex: female Admission Date (Current Location): 06/27/2021  [facility] and [place] Number:  Engineering geologist and Address:  [facility], [address] [place], [place] [address]      Provider Number: [id number]  Attending Physician Name and Address:  Enzo Bi, MD  Relative Name and Phone Number:  Bobbe Medico, daughter- [phone]    Current Level of Care: Hospital Recommended Level of Care: [place] Prior Approval Number:    Date Approved/Denied:   PASRR Number: [id number]  Discharge Plan: SNF    Current Diagnoses: Patient Active Problem List   Diagnosis Date Noted   Hypotension 06/27/2021   Symptomatic anemia 02/12/2021   Leukocytosis 02/12/2021   Lactic acid acidosis 02/12/2021   Chemotherapy-induced peripheral neuropathy ([place]) 10/06/2020   Hospital discharge follow-up 08/31/2020   Difficulty sleeping 07/25/2020   Difficulty walking 07/25/2020   H/O subarachnoid hemorrhage 07/25/2020   Nausea 07/25/2020   Moderate protein-calorie malnutrition ([place]) 07/04/2020   Subdural hematoma 06/26/2020   Hyperkalemia 06/19/2020   Chronic respiratory failure ([place]) 06/19/2020   Hypokalemia    Fall    Troponin I above reference range    Status post hip hemiarthroplasty    Closed nondisplaced fracture of greater trochanter of right femur ([place]) 03/30/2020   Closed displaced midcervical fracture of right femur ([place]) 03/30/2020   Hip fracture ([place]) 03/28/2020   Multiple myeloma ([place]) 03/01/2020   Goals of care, counseling/discussion 03/01/2020   Osteomyelitis ([place]) 01/20/2020   Pain in finger of left hand 01/20/2020   Gout 10/27/2019   Acute blood loss anemia    Gastrointestinal hemorrhage    UGI bleed 09/04/2019   Drug-induced constipation 07/26/2019   Insomnia due to medical condition 07/26/2019    Benign hypertensive kidney disease with chronic kidney disease 07/20/2019   Proteinuria 07/20/2019   Hyposmolality and/or hyponatremia 07/20/2019   Dehydration    Anemia in chronic kidney disease (CKD) 07/04/2019   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   Pedal edema 02/19/2018   SOB (shortness of breath) on exertion 02/19/2018   Left main coronary artery disease 03/07/2017   Drug-induced torsades de pointes 03/06/2017   Ventricular tachycardia 03/05/2017   Defibrillator discharge 65/99/3570   Chronic systolic CHF (congestive heart failure) ([place]) 03/04/2017   COPD (chronic obstructive pulmonary disease) ([place]) 02/10/2017   Diabetes mellitus type 2, uncomplicated ([place]) 17/79/3903   H/O ventricular fibrillation 02/10/2017   Hyperlipidemia 02/10/2017   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   Ischemic cardiomyopathy 02/10/2017   Myocardial infarction ([place]) 02/10/2017   Moderate mitral regurgitation 02/10/2017   Syncope 02/04/2017   CAD (coronary artery disease) 02/04/2017   HTN (hypertension) 02/04/2017   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/04/2017    Orientation RESPIRATION BLADDER Height & Weight     Self, Situation, Place  O2 (2-3 L [place]) Indwelling catheter Weight: 77.8 kg Height:  _0  (160 cm)  BEHAVIORAL SYMPTOMS/MOOD NEUROLOGICAL BOWEL NUTRITION STATUS      Continent Diet (  Dysphagia diet 3)  AMBULATORY STATUS COMMUNICATION OF NEEDS Skin   Extensive Assist Verbally Skin abrasions, Bruising (brusing, head, arms, legs- abrasion head, arms and legs)                        Personal Care Assistance Level of Assistance  Bathing, Feeding, Dressing Bathing Assistance: Maximum assistance Feeding assistance: Limited assistance Dressing Assistance: Maximum assistance     Functional Limitations Info  Sight, Hearing, Speech Sight Info: Adequate Hearing Info: Adequate Speech Info: Adequate    SPECIAL CARE FACTORS FREQUENCY  PT (By licensed PT), OT (By licensed OT)     PT Frequency: 5 days per week OT Frequency: 5 days per week            Contractures Contractures Info: Not present    Additional Factors Info  Code Status, Allergies Code Status Info: DNR Allergies Info: Celebrex, glipizide, levaquin, lisinopril, sulfa drugs, metformin, PCN           Current Medications (06/29/2021):  This is the current hospital active medication list Current Facility-Administered Medications  Medication Dose Route Frequency Provider Last Rate Last Admin   0.9 %  sodium chloride infusion  10 mL/hr Intravenous Once Duffy Bruce, MD   Held at 06/27/21 0819   0.9 %  sodium chloride infusion  250 mL Intravenous Continuous Teressa Lower, NP   Stopped at 06/27/21 2000   acetaminophen (TYLENOL) tablet 650 mg  650 mg Oral Q4H PRN Teressa Lower, NP       albumin human 25 % solution 25 g  25 g Intravenous Daily [name], MD 60 mL/hr at 06/29/21 0953 25 g at 06/29/21 0953   [START ON 06/30/2021] albumin human 25 % solution 75 g  75 g Intravenous Once [name], MD       cefTRIAXone (ROCEPHIN) 2 g in sodium chloride 0.9 % 100 mL IVPB  2 g Intravenous Q24H Duffy Bruce, MD 200 mL/hr at 06/29/21 0900 2 g at 06/29/21 0900   Chlorhexidine Gluconate Cloth 2 % PADS 6 each  6 each Topical Daily Flora Lipps, MD   6 each at 06/29/21 0856   FLUoxetine (PROZAC) capsule 10 mg  10 mg Oral Daily Teressa Lower, NP   10 mg at 06/29/21 0857   levothyroxine (SYNTHROID) tablet 50 mcg  50 mcg Oral Q48H Flora Lipps, MD   50 mcg at 06/29/21 0539   levothyroxine (SYNTHROID) tablet  75 mcg  75 mcg Oral Q48H Kasa, Kurian, MD       lidocaine (LIDODERM) 5 % 1 patch  1 patch Transdermal q1600 Teressa Lower, NP   1 patch at 06/28/21 1700   loratadine (CLARITIN) tablet 10 mg  10 mg Oral Daily Teressa Lower, NP   10 mg at 06/29/21 0856   MEDLINE mouth rinse  15 mL Mouth Rinse BID Flora Lipps, MD   15 mL at 06/29/21 0857   midodrine (PROAMATINE) tablet 10 mg  10 mg Oral TID Teressa Lower, NP   10 mg at 06/29/21 1346   morphine (PF) 2 MG/ML injection 1-2 mg  1-2 mg Intravenous Q4H PRN Teressa Lower, NP   1 mg at 06/29/21 1345   octreotide (SANDOSTATIN) 500 mcg in sodium chloride 0.9 % 250 mL (2 mcg/mL) infusion  50 mcg/hr Intravenous Continuous Duffy Bruce, MD 25 mL/hr at 06/29/21 0632 50 mcg/hr at 06/29/21 2202   polyethylene glycol (MIRALAX / GLYCOLAX) packet 17 g  17 g Oral Daily PRN Teressa Lower, NP   17 g at 06/29/21 0900   simvastatin (ZOCOR) tablet 20 mg  20 mg Oral QHS Teressa Lower, NP   20 mg at 06/28/21 2142   sodium chloride (OCEAN) 0.65 % nasal spray 1 spray  1 spray Each Nare PRN Bradly Bienenstock, NP   1 spray at 06/29/21 0900     Discharge Medications: Please see discharge summary for a list of discharge medications.  Relevant Imaging Results:  Relevant Lab Results:   Additional Information SS# [id number]  Shelbie Hutching, RN

## 2021-06-29 NOTE — Progress Notes (Signed)
Heather Peterson , MD [address] [place], [place], [place], [address] [address], [place], [place], [place], [address] Phone: [phone]  Fax: [phone]   Heather Peterson is being followed for SBP Day 3 of follow up   Subjective: Still drowsy and confused   Objective: Vital signs in last 24 hours: Vitals:   06/29/21 0600 06/29/21 0700 06/29/21 0743 06/29/21 0800  BP: 94/66 103/64  111/66  Pulse: 78 83  81  Resp: 10 14  13   Temp: 98.1 F (36.7 C) 97.9 F (36.6 C)  98.1 F (36.7 C)  TempSrc: Bladder Bladder    SpO2: 96% 95% 96% 96%  Weight:      Height:       Weight change: -1.8 kg  Intake/Output Summary (Last 24 hours) at 06/29/2021 7473 Last data filed at 06/29/2021 0800 Gross per 24 hour  Intake 1204.42 ml  Output 620 ml  Net 584.42 ml     Exam: Heart:: Regular rate and rhythm Lungs:  decreased air entry b/l  Abdomen: soft, nontender, normal bowel sounds, distended , free fluid +   Lab Results: @LABTEST2 @ Micro Results: Recent Results (from the past 240 hour(s))  Resp Panel by RT-PCR (Flu A&B, Covid) Nasopharyngeal Swab     Status: None   Collection Time: 06/27/21  7:55 AM   Specimen: Nasopharyngeal Swab; Nasopharyngeal(NP) swabs in vial transport medium  Result Value Ref Range Status   SARS Coronavirus 2 by RT PCR NEGATIVE NEGATIVE Final    Comment: (NOTE) SARS-CoV-2 target nucleic acids are NOT DETECTED.  The SARS-CoV-2 RNA is generally detectable in upper respiratory specimens during the acute phase of infection. The lowest concentration of SARS-CoV-2 viral copies this assay can detect is 138 copies/mL. A negative result does not preclude SARS-Cov-2 infection and should not be used as the sole basis for treatment or other patient management decisions. A negative result may occur with  improper specimen collection/handling, submission of specimen other than nasopharyngeal swab, presence of viral mutation(s) within the areas targeted by  this assay, and inadequate number of viral copies(<138 copies/mL). A negative result must be combined with clinical observations, patient history, and epidemiological information. The expected result is Negative.  Fact Sheet for Patients:  EntrepreneurPulse.com.au  Fact Sheet for Healthcare Providers:  IncredibleEmployment.be  This test is no t yet approved or cleared by the [place] FDA and  has been authorized for detection and/or diagnosis of SARS-CoV-2 by FDA under an Emergency Use Authorization (EUA). This EUA will remain  in effect (meaning this test can be used) for the duration of the COVID-19 declaration under Section 564(b)(1) of the Act, 21 U.S.C.section 360bbb-3(b)(1), unless the authorization is terminated  or revoked sooner.       Influenza A by PCR NEGATIVE NEGATIVE Final   Influenza B by PCR NEGATIVE NEGATIVE Final    Comment: (NOTE) The Xpert Xpress SARS-CoV-2/FLU/RSV plus assay is intended as an aid in the diagnosis of influenza from Nasopharyngeal swab specimens and should not be used as a sole basis for treatment. Nasal washings and aspirates are unacceptable for Xpert Xpress SARS-CoV-2/FLU/RSV testing.  Fact Sheet for Patients: EntrepreneurPulse.com.au  Fact Sheet for Healthcare Providers: IncredibleEmployment.be  This test is not yet approved or cleared by the [place] FDA and has been authorized for detection and/or diagnosis of SARS-CoV-2 by FDA under an Emergency Use Authorization (EUA). This EUA will remain in effect (meaning this test can be used) for the duration of the COVID-19 declaration under  Section 564(b)(1) of the Act, 21 U.S.C. section 360bbb-3(b)(1), unless the authorization is terminated or revoked.  Performed at [facility], [address] [place], [place] [address]   Blood culture (single)     Status: None (Preliminary result)   Collection  Time: 06/27/21  7:55 AM   Specimen: BLOOD  Result Value Ref Range Status   Specimen Description BLOOD BLOOD RIGHT FOREARM  Final   Special Requests   Final    BOTTLES DRAWN AEROBIC AND ANAEROBIC Blood Culture results may not be optimal due to an excessive volume of blood received in culture bottles   Culture   Final    NO GROWTH 2 DAYS Performed at [facility], [address], [place], [place] [address]    Report Status PENDING  Incomplete  Blood culture (single)     Status: None (Preliminary result)   Collection Time: 06/27/21 10:08 AM   Specimen: BLOOD LEFT HAND  Result Value Ref Range Status   Specimen Description BLOOD LEFT HAND  Final   Special Requests   Final    BOTTLES DRAWN AEROBIC AND ANAEROBIC Blood Culture adequate volume   Culture  Setup Time   Final    Organism ID to follow GRAM POSITIVE COCCI ANAEROBIC BOTTLE ONLY CRITICAL RESULT CALLED TO, READ BACK BY AND VERIFIED WITH: KRISTIN MILLER 06/28/21 0741 BY [email or web address] Performed at [facility], [address] [place], [place] [address]    Culture GRAM POSITIVE COCCI  Final   Report Status PENDING  Incomplete  Blood Culture ID Panel (Reflexed)     Status: Abnormal   Collection Time: 06/27/21 10:08 AM  Result Value Ref Range Status   Enterococcus faecalis NOT DETECTED NOT DETECTED Final   Enterococcus Faecium NOT DETECTED NOT DETECTED Final   Listeria monocytogenes NOT DETECTED NOT DETECTED Final   Staphylococcus species NOT DETECTED NOT DETECTED Final   Staphylococcus aureus (BCID) NOT DETECTED NOT DETECTED Final   Staphylococcus epidermidis NOT DETECTED NOT DETECTED Final   Staphylococcus lugdunensis NOT DETECTED NOT DETECTED Final   Streptococcus species DETECTED (A) NOT DETECTED Final    Comment: Not Enterococcus species, Streptococcus agalactiae, Streptococcus pyogenes, or Streptococcus pneumoniae. CRITICAL RESULT CALLED TO, READ BACK BY AND VERIFIED WITH: KRISTIN MILLER 06/28/21 0741 MW     Streptococcus agalactiae NOT DETECTED NOT DETECTED Final   Streptococcus pneumoniae NOT DETECTED NOT DETECTED Final   Streptococcus pyogenes NOT DETECTED NOT DETECTED Final   A.calcoaceticus-baumannii NOT DETECTED NOT DETECTED Final   Bacteroides fragilis NOT DETECTED NOT DETECTED Final   Enterobacterales NOT DETECTED NOT DETECTED Final   Enterobacter cloacae complex NOT DETECTED NOT DETECTED Final   Escherichia coli NOT DETECTED NOT DETECTED Final   Klebsiella aerogenes NOT DETECTED NOT DETECTED Final   Klebsiella oxytoca NOT DETECTED NOT DETECTED Final   Klebsiella pneumoniae NOT DETECTED NOT DETECTED Final   Proteus species NOT DETECTED NOT DETECTED Final   Salmonella species NOT DETECTED NOT DETECTED Final   Serratia marcescens NOT DETECTED NOT DETECTED Final   Haemophilus influenzae NOT DETECTED NOT DETECTED Final   Neisseria meningitidis NOT DETECTED NOT DETECTED Final   Pseudomonas aeruginosa NOT DETECTED NOT DETECTED Final   Stenotrophomonas maltophilia NOT DETECTED NOT DETECTED Final   Candida albicans NOT DETECTED NOT DETECTED Final   Candida auris NOT DETECTED NOT DETECTED Final   Candida glabrata NOT DETECTED NOT DETECTED Final   Candida krusei NOT DETECTED NOT DETECTED Final   Candida parapsilosis NOT DETECTED NOT DETECTED Final   Candida  tropicalis NOT DETECTED NOT DETECTED Final   Cryptococcus neoformans/gattii NOT DETECTED NOT DETECTED Final    Comment: Performed at [facility], [address] [place], [place] [address]  MRSA Next Gen by PCR, Nasal     Status: None   Collection Time: 06/27/21 11:30 AM   Specimen: Nasal Mucosa; Nasal Swab  Result Value Ref Range Status   MRSA by PCR Next Gen NOT DETECTED NOT DETECTED Final    Comment: (NOTE) The GeneXpert MRSA Assay (FDA approved for NASAL specimens only), is one component of a comprehensive MRSA colonization surveillance program. It is not intended to diagnose MRSA infection nor to guide or monitor  treatment for MRSA infections. Test performance is not FDA approved in patients less than 67 years old. Performed at [facility], [address] [place], [place] [address]   Body fluid culture w Gram Stain     Status: None (Preliminary result)   Collection Time: 06/27/21  3:00 PM   Specimen: PATH Cytology Peritoneal fluid  Result Value Ref Range Status   Specimen Description   Final    PERITONEAL Performed at [facility], [address] [place], [place] [address]    Special Requests   Final    NONE Performed at [facility], [address] [place], [place] [address]    Gram Stain   Final    RARE WBC PRESENT,BOTH PMN AND MONONUCLEAR NO ORGANISMS SEEN    Culture   Final    NO GROWTH < 24 HOURS Performed at [facility], [place] [address] [place], [place] [address]    Report Status PENDING  Incomplete   Studies/Results: CT ABDOMEN PELVIS WO CONTRAST  Result Date: 06/27/2021 CLINICAL DATA:  Abdominal distension.  Sepsis.  Hypotension. EXAM: CT ABDOMEN AND PELVIS WITHOUT CONTRAST TECHNIQUE: Multidetector CT imaging of the abdomen and pelvis was performed following the standard protocol without IV contrast. RADIATION DOSE REDUCTION: This exam was performed according to the departmental dose-optimization program which includes automated exposure control, adjustment of the mA and/or kV according to patient size and/or use of iterative reconstruction technique. COMPARISON:  06/18/2020 FINDINGS: Lower chest: Heart is markedly enlarged. Small right pleural effusion noted. Bibasilar atelectasis evident. Hepatobiliary: Nodular liver contour is compatible with cirrhosis. Gallbladder surgically absent. No intrahepatic or extrahepatic biliary dilation. Pancreas: No focal mass lesion. No dilatation of the main duct. No intraparenchymal cyst. No peripancreatic edema. Spleen: No splenomegaly. No focal mass lesion. Adrenals/Urinary Tract: Stable bilateral adrenal  adenomas. Cortical scarring noted lower pole right kidney with punctate nonobstructing stones in the interpolar region. Stable 6 mm stone lower pole left kidney with punctate nonobstructing stone interpolar left kidney. No evidence for hydroureter. Bladder is obscured by beam hardening artifact from right hip replacement but there appears to be of Foley catheter in place in gas in the bladder lumen is compatible with the instrumentation. Stomach/Bowel: Stomach is nondistended. Duodenum is normally positioned as is the ligament of Treitz. No small bowel wall thickening. No small bowel dilatation. No gross colonic mass. No colonic wall thickening. Diverticular changes are noted in the left colon without evidence of diverticulitis. Vascular/Lymphatic: There is advanced atherosclerotic calcification of the abdominal aorta without aneurysm. There is no gastrohepatic or hepatoduodenal ligament lymphadenopathy. No retroperitoneal or mesenteric lymphadenopathy. No pelvic sidewall lymphadenopathy. Reproductive: Inferior pelvis obscured by beam hardening artifact. Lobular soft tissue in the upper central pelvis may be related to the uterus/ovaries but cannot be definitively characterized. Other: Moderate volume abdominopelvic ascites.  Musculoskeletal: Status post right hip replacement No worrisome lytic or sclerotic osseous abnormality. IMPRESSION: 1. No acute findings in the abdomen or pelvis. 2. Nodular liver contour compatible with cirrhosis. 3. Moderate volume abdominopelvic ascites. 4. Stable bilateral adrenal adenomas. 5. Bilateral nonobstructing renal stones. 6. Lobular soft tissue in the upper central pelvis may be related to the uterus/ovaries but cannot be definitively characterized and has been incompletely visualized. Pelvic ultrasound recommended to further evaluate. 7. Cardiomegaly with small right pleural effusion. 8. Aortic Atherosclerosis (ICD10-I70.0). Electronically Signed   By: Misty Stanley M.D.   On:  06/27/2021 16:22   DG Abd 1 View  Result Date: 06/27/2021 CLINICAL DATA:  Abdominal distension. EXAM: ABDOMEN - 1 VIEW COMPARISON:  CT 02/12/2021.  Radiographs 07/16/2019. FINDINGS: 1330 hours. Two views submitted. The bowel gas pattern appears nonobstructive. There is no supine evidence of bowel wall thickening or free intraperitoneal air. Scattered vascular calcifications are noted. Patient is status post cholecystectomy and right total hip arthroplasty. IMPRESSION: No radiographic evidence of active abdominal process. No findings to suggest recurrent large volume ascites. Electronically Signed   By: Richardean Sale M.D.   On: 06/27/2021 13:43   US Venous Img Lower Bilateral (DVT)  Result Date: 06/27/2021 CLINICAL DATA:  Bilateral lower extremity pain and edema. History of smoking and malignancy. Evaluate for DVT. EXAM: BILATERAL LOWER EXTREMITY VENOUS DOPPLER ULTRASOUND TECHNIQUE: Gray-scale sonography with graded compression, as well as color Doppler and duplex ultrasound were performed to evaluate the lower extremity deep venous systems from the level of the common femoral vein and including the common femoral, femoral, profunda femoral, popliteal and calf veins including the posterior tibial, peroneal and gastrocnemius veins when visible. The superficial great saphenous vein was also interrogated. Spectral Doppler was utilized to evaluate flow at rest and with distal augmentation maneuvers in the common femoral, femoral and popliteal veins. COMPARISON:  None. FINDINGS: RIGHT LOWER EXTREMITY Common Femoral Vein: No evidence of thrombus. Normal compressibility, respiratory phasicity and response to augmentation. Saphenofemoral Junction: No evidence of thrombus. Normal compressibility and flow on color Doppler imaging. Profunda Femoral Vein: No evidence of thrombus. Normal compressibility and flow on color Doppler imaging. Femoral Vein: No evidence of thrombus. Normal compressibility, respiratory  phasicity and response to augmentation. Popliteal Vein: No evidence of thrombus. Normal compressibility, respiratory phasicity and response to augmentation. Calf Veins: No evidence of thrombus. Normal compressibility and flow on color Doppler imaging. Superficial Great Saphenous Vein: No evidence of thrombus. Normal compressibility. Venous Reflux:  None. Other Findings:  None. LEFT LOWER EXTREMITY Common Femoral Vein: No evidence of thrombus. Normal compressibility, respiratory phasicity and response to augmentation. Saphenofemoral Junction: No evidence of thrombus. Normal compressibility and flow on color Doppler imaging. Profunda Femoral Vein: No evidence of thrombus. Normal compressibility and flow on color Doppler imaging. Femoral Vein: No evidence of thrombus. Normal compressibility, respiratory phasicity and response to augmentation. Popliteal Vein: No evidence of thrombus. Normal compressibility, respiratory phasicity and response to augmentation. Calf Veins: No evidence of thrombus. Normal compressibility and flow on color Doppler imaging. Superficial Great Saphenous Vein: No evidence of thrombus. Normal compressibility. Venous Reflux:  None. Other Findings:  None. IMPRESSION: No evidence of DVT within either lower extremity. Electronically Signed   By: Sandi Mariscal M.D.   On: 06/27/2021 15:59   US Paracentesis  Result Date: 06/27/2021 INDICATION: Patient with a history of cirrhosis and recurrent ascites admitted with sepsis. Interventional radiology asked to perform a diagnostic and therapeutic paracentesis. EXAM: ULTRASOUND GUIDED PARACENTESIS MEDICATIONS: 1% lidocaine 10  mL COMPLICATIONS: None immediate. PROCEDURE: Informed written consent was obtained from the patient after a discussion of the risks, benefits and alternatives to treatment. A timeout was performed prior to the initiation of the procedure. Initial ultrasound scanning demonstrates a large amount of ascites within the left lower abdominal  quadrant. The left lower abdomen was prepped and draped in the usual sterile fashion. 1% lidocaine was used for local anesthesia. Procedure was done at the bedside in the ICU due to patient's critical status. Following this, a 19 gauge, 7-cm, Yueh catheter was introduced. An ultrasound image was saved for documentation purposes. The paracentesis was performed. The catheter was removed and a dressing was applied. The patient tolerated the procedure well without immediate post procedural complication. FINDINGS: A total of approximately 3.2 L of clear yellow fluid was removed. Samples were sent to the laboratory as requested by the clinical team. IMPRESSION: Successful ultrasound-guided paracentesis yielding 3.2 liters of peritoneal fluid. Read by: Soyla Dryer, NP Electronically Signed   By: Albin Felling M.D.   On: 06/27/2021 16:12   Medications: I have reviewed the patient's current medications. Scheduled Meds:  Chlorhexidine Gluconate Cloth  6 each Topical Daily   FLUoxetine  10 mg Oral Daily   levothyroxine  50 mcg Oral Q48H   levothyroxine  75 mcg Oral Q48H   lidocaine  1 patch Transdermal q1600   loratadine  10 mg Oral Daily   mouth rinse  15 mL Mouth Rinse BID   midodrine  10 mg Oral TID   simvastatin  20 mg Oral QHS   Continuous Infusions:  sodium chloride Stopped (06/27/21 0819)   sodium chloride Stopped (06/27/21 2000)   albumin human     cefTRIAXone (ROCEPHIN)  IV 2 g (06/29/21 0900)   octreotide  (SANDOSTATIN)    IV infusion 50 mcg/hr (06/29/21 6144)   PRN Meds:.acetaminophen, morphine injection, polyethylene glycol, sodium chloride   Assessment: Principal Problem:   Hypotension Heather Peterson is a 81 y.o. 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  Decompensated liver cirrhosis with possible SBP and associated hepatic encephalopathy.  She also has bacteremia with positive blood cultures.Ascitic fluid tap yesterday showed a total cell count of 1600 with a neutrophil count of 65% which meets criteria for SBP. Culture so far negative.  Denies any hematemesis or melena.  She has had a fibula fracture questionable blood loss.  When I went in to see her she was quite confused and I am concerned she has overt encephalopathy secondary to the infection.  Her blood pressure still in the hypotensive range.  She is on oxygen.No evidence of GI bleed. Hb stable     Plan 1.  Continue antibiotics for SBP 2.  Intravenous 25 percent albumin 75g  on day 3 ie tomorrow .  Albumin orders have been placed for today.  Daily 25 g of albumin due to AKI 3.  Continue daily octreotide and midodrine due to renal failure 4.  Follow-up ascites fluid cultures and change antibiotics accordingly if needed 5.  High mortality rate associated with SBP, renal failure.  Will suggest to discuss goals of care. 6.  After his acute episode will require lifelong prophylaxis for SBP 7.  Still has ascites - can drain PRN with albumin administered 25 grams of 25 % every time fluid taken out.  8. Discussed with family that she is sick and that this condition is associated with high mortality      LOS: 2 days   Heather Bellows, MD 06/29/2021, 9:11 AM

## 2021-06-29 NOTE — Progress Notes (Signed)
°   06/29/21 1200  Clinical Encounter Type  Visited With Patient and family together  Visit Type Initial;Social support   Chaplain engaged in conversation with family as they were making decision on home care for patient. Family noted that these are difficult decisions and have appreciated the help here in the hospital.

## 2021-06-29 NOTE — Progress Notes (Addendum)
SLP Cancellation Note  Patient Details Name: Heather Peterson MRN: [id number] DOB: [birth year]   Cancelled treatment:       Reason Eval/Treat Not Completed: Patient not medically ready;Medical issues which prohibited therapy;Fatigue/lethargy limiting ability to participate (chart reviewed; observation). Noted pt has been lethargy this morning and now; low BP. Confusion noted per staff/MD note.  Will hold on tx session. Recommend continuing to monitor pt's status and for need to downgrade diet if overt s/s of aspiration are noted by NSG staff during meals. Recommend a modified dysphagia diet for conservation of energy during time of Acute illness. ST services will continue to monitor pt's status while admitted. Recommend general aspiration precautions w/ all oral intake.      Orinda Kenner, MS, CCC-SLP Speech Language Pathologist Rehab Services; [place] [phone] (ascom) Terri Rorrer 06/29/2021, 3:47 PM

## 2021-06-30 LAB — CBC
HCT: 28.9 % — ABNORMAL LOW (ref 36.0–46.0)
Hemoglobin: 8.8 g/dL — ABNORMAL LOW (ref 12.0–15.0)
MCH: 28 pg (ref 26.0–34.0)
MCHC: 30.4 g/dL (ref 30.0–36.0)
MCV: 92 fL (ref 80.0–100.0)
Platelets: 159 10*3/uL (ref 150–400)
RBC: 3.14 MIL/uL — ABNORMAL LOW (ref 3.87–5.11)
RDW: 21.2 % — ABNORMAL HIGH (ref 11.5–15.5)
WBC: 5 10*3/uL (ref 4.0–10.5)
nRBC: 2 % — ABNORMAL HIGH (ref 0.0–0.2)

## 2021-06-30 LAB — BASIC METABOLIC PANEL
Anion gap: 10 (ref 5–15)
BUN: 53 mg/dL — ABNORMAL HIGH (ref 8–23)
CO2: 23 mmol/L (ref 22–32)
Calcium: 8.2 mg/dL — ABNORMAL LOW (ref 8.9–10.3)
Chloride: 105 mmol/L (ref 98–111)
Creatinine, Ser: 2.33 mg/dL — ABNORMAL HIGH (ref 0.44–1.00)
GFR, Estimated: 21 mL/min — ABNORMAL LOW (ref >60)
Glucose, Bld: 121 mg/dL — ABNORMAL HIGH (ref 70–99)
Potassium: 3.7 mmol/L (ref 3.5–5.1)
Sodium: 138 mmol/L (ref 135–145)

## 2021-06-30 LAB — MAGNESIUM: Magnesium: 2.5 mg/dL — ABNORMAL HIGH (ref 1.7–2.4)

## 2021-06-30 MED ORDER — HYDROCODONE-ACETAMINOPHEN 5-325 MG PO TABS
1.0000 | ORAL_TABLET | Freq: Four times a day (QID) | ORAL | Status: DC | PRN
  Administered 2021-07-02 – 2021-07-03 (×4): 1 via ORAL
  Filled 2021-06-30 (×4): qty 1

## 2021-06-30 NOTE — Progress Notes (Signed)
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 Per EMS upon their arrival at pts home pt hypotensive bp 84/48, therefore 500 ml iv fluid bolus administered.  She was also placed on 3L O2 via nasal canula (wears prn home O2).    In the ED, pt was found hemoccult positive, therefore received 1 unit of pRBC's.  GI consulted Dr. Vicente Males recommended starting empiric abx, octreotide, and protonix.  CT Head negative for acute abnormality.  CT Cervical Spine revealed no evidence of acute fracture or traumatic malalignment, however concerned for emphysema and possible mild interstitial edema vs. small layering right pleural effusion.  Left tibia/fibula xray revealed acute fracture of the proximal metadiaphysis of the fibula.  Orthopedic surgeon Dr. Roland Rack consulted per ED MD with recommendation to place a knee immobilizer for now. PCCM team contacted for ICU admission due to possible need of vasopressor therapy due to hypotension.  Pt was on Levo for just 1 day and was weaned off and transferred to [facility] on 2/17.  Hypotension, chronic, secondary to  Liver cirrhosis, chronic  Possible acute on chronic anemia , hypovolemic --Outpatient records appear SBP high 80s-low 100s. --briefly on Levo for a day.   Plan: --cont home midodrine 10 mg TID  Sepsis, ruled out --did not meet criteria  SBP --Ascitic fluid tap 3.2L showed a total  cell count of 1600 with a neutrophil count of 65% which meets criteria for SBP. Culture so far negative.  --GI consulted Plan: --cont ceftriaxone, day 4 of 7 --IV albumin and octreotide, per GI --will require lifelong prophylaxis for SBP, per GI   Decompensated liver cirrhosis  --GI consulted, discussed with family the high mortality   Acute toxic encephalopathy 2/2 infection --treat SBP  Acute closed nondisplaced left proximal fibular fracture --ortho consulted, Dr. Roland Rack --knee immobilizer applied --weight-bear as tolerated on the left leg --follow-up in ortho office in 5 to 6 weeks for re-evaluation. 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  Cr ~2.2 in the past month.   Acute on chronic anemia with hx of angiodysplastic lesions  - Hgb 8-9's - GI consulted appreciate input~continue octreotide/protonix gtts and once stable will proceed with EGD  --monitor Hgb and transfuse to keep Hgb >8   Hypothyroidism  - cont home Synthroid   Multiple myeloma  - Oncology consulted, Dr. Grayland Ormond --Patient has not received treatment since February 09, 2021.  No further treatments are planned at this time, per onc. --Recommend palliative care consult.  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   Objective: Vitals:   06/30/21 1300 06/30/21 1400 06/30/21 1414 06/30/21 1704  BP: 98/66 (!) 99/58 118/78 110/80  Pulse: 81 78 74 74  Resp: 12 18 16 16   Temp: (!) 97 F (36.1 C) (!) 97 F (36.1 C) 97.7 F (36.5 C) 98.2 F (36.8 C)  TempSrc: Bladder Bladder Oral   SpO2: 94% 99% 100% 99%  Weight:      Height:        Intake/Output Summary (Last 24 hours) at 06/30/2021 1732 Last data filed at 06/30/2021 1200 Gross per 24 hour  Intake 898.34 ml  Output 635 ml  Net 263.34 ml   Filed Weights   06/28/21 0500 06/29/21 0500 06/30/21 0600  Weight: 79.6 kg 77.8 kg 79 kg    Examination:   Constitutional: NAD, sleeping, but arousable HEENT: conjunctivae and lids normal, EOMI CV: No cyanosis.   RESP: normal respiratory effort, on 3L SKIN: warm, dry   Data Reviewed: I have personally reviewed following labs and imaging studies  CBC: Recent Labs  Lab 06/26/21 1813 06/27/21 0634 06/27/21 1336 06/27/21 2247 06/28/21 0453 06/29/21 0354 06/30/21 0414  WBC 11.0* 8.9  --   --  8.1 5.6 5.0  NEUTROABS 8.9* 6.4  --   --   --   --   --   HGB 7.4* 6.7* 7.9* 9.2* 9.2* 8.9* 8.8*  HCT 25.4* 22.9* 25.1* 29.3* 29.4* 28.2* 28.9*  MCV 94.8 95.0  --   --  89.6 91.0 92.0  PLT 231 211  --   --  174 149* 916   Basic Metabolic Panel: Recent Labs  Lab 06/26/21 1813 06/26/21 1818 06/27/21 0634 06/27/21 1336 06/28/21 0453 06/29/21 0354 06/30/21 0414  NA 135  --  135  --  138 138 138  K 4.5  --  4.1  --  4.1 3.7 3.7  CL 100  --  101  --  104 105 105  CO2 24  --  25  --  22 22 23   GLUCOSE 117*  --  94  --  126* 121* 121*  BUN 52*  --  50*  --  51* 50* 53*  CREATININE 2.49*  --  2.53*   --  2.60* 2.50* 2.33*  CALCIUM 9.2  --  8.5*  --  8.2* 8.2* 8.2*  MG  --  2.3  --  2.3 2.4 2.5* 2.5*  PHOS  --   --   --  6.6* 6.5*  --   --    GFR: Estimated Creatinine Clearance: 19.5 mL/min (A) (by C-G formula based on SCr of 2.33 mg/dL (H)). Liver Function Tests: Recent Labs  Lab 06/26/21 1813 06/27/21 0634  AST 18 17  ALT 9 8  ALKPHOS 114 104  BILITOT 0.6 0.7  PROT 6.5 6.0*  ALBUMIN 3.3* 2.9*   Recent Labs  Lab 06/27/21 0634  LIPASE 49   Recent Labs  Lab 06/26/21 2108  AMMONIA 18   Coagulation Profile: Recent Labs  Lab 06/27/21 1336  INR 1.5*   Cardiac Enzymes: No results for input(s): CKTOTAL, CKMB, CKMBINDEX, TROPONINI in the last 168 hours. BNP (last 3 results) No results for input(s): PROBNP in the last 8760 hours. HbA1C: No results for input(s): HGBA1C in the last 72 hours. CBG: Recent Labs  Lab 06/27/21 1127  GLUCAP 87   Lipid Profile: No results for input(s): CHOL, HDL, LDLCALC, TRIG, CHOLHDL, LDLDIRECT in the last 72 hours. Thyroid Function Tests: No results for input(s): TSH, T4TOTAL, FREET4, T3FREE, THYROIDAB in the last 72 hours.  Anemia Panel: Recent Labs    06/27/21 2247  VITAMINB12 614   Sepsis Labs: Recent Labs  Lab 06/26/21 1813 06/27/21 0634 06/27/21 0755 06/27/21 1008  PROCALCITON  --   --  0.32  --   LATICACIDVEN 1.1 1.2  --  1.2    Recent Results (from the past 240 hour(s))  Resp Panel by RT-PCR (Flu A&B, Covid) Nasopharyngeal Swab     Status: None   Collection Time: 06/27/21  7:55 AM   Specimen: Nasopharyngeal Swab; Nasopharyngeal(NP) swabs in vial transport medium  Result Value Ref Range Status   SARS Coronavirus 2 by RT PCR NEGATIVE NEGATIVE Final    Comment: (NOTE) SARS-CoV-2 target nucleic acids are NOT DETECTED.  The SARS-CoV-2 RNA is generally detectable in upper respiratory specimens during the acute phase of infection. The lowest concentration of SARS-CoV-2 viral copies this assay can detect is 138  copies/mL. A negative result does not preclude SARS-Cov-2 infection and should not be used as the sole basis for treatment or other patient management decisions. A negative result may occur with  improper specimen collection/handling, submission of specimen other than nasopharyngeal swab, presence of viral mutation(s) within the areas targeted by this assay, and inadequate number of viral copies(<138 copies/mL). A negative result must be combined with clinical observations, patient history, and epidemiological information. The expected result is Negative.  Fact Sheet for Patients:  EntrepreneurPulse.com.au  Fact Sheet for Healthcare Providers:  IncredibleEmployment.be  This test is no t yet approved or cleared by the [place] FDA and  has been authorized for detection and/or diagnosis of SARS-CoV-2 by FDA under an Emergency Use Authorization (EUA). This EUA will remain  in effect (meaning this test can be used) for the duration of the COVID-19 declaration under Section 564(b)(1) of the Act, 21 U.S.C.section 360bbb-3(b)(1), unless the authorization is terminated  or revoked sooner.       Influenza A by PCR NEGATIVE NEGATIVE Final   Influenza B by PCR NEGATIVE NEGATIVE Final    Comment: (NOTE) The Xpert Xpress SARS-CoV-2/FLU/RSV plus assay is intended as an aid in the diagnosis of influenza from Nasopharyngeal swab specimens and should not be used as a sole basis for treatment. Nasal washings and aspirates are unacceptable for Xpert Xpress SARS-CoV-2/FLU/RSV testing.  Fact Sheet for Patients: EntrepreneurPulse.com.au  Fact Sheet for Healthcare Providers: IncredibleEmployment.be  This test is not yet approved or cleared by the [place] FDA and has been authorized for detection and/or diagnosis of SARS-CoV-2 by FDA under an Emergency Use Authorization (EUA). This EUA will remain in effect (meaning  this test can be used) for the duration of the COVID-19 declaration under Section 564(b)(1) of the Act, 21 U.S.C. section 360bbb-3(b)(1), unless the authorization is terminated or revoked.  Performed at [facility], [address], [place], [place] [address]   Blood culture (single)  Status: None (Preliminary result)   Collection Time: 06/27/21  7:55 AM   Specimen: BLOOD  Result Value Ref Range Status   Specimen Description BLOOD BLOOD RIGHT FOREARM  Final   Special Requests   Final    BOTTLES DRAWN AEROBIC AND ANAEROBIC Blood Culture results may not be optimal due to an excessive volume of blood received in culture bottles   Culture   Final    NO GROWTH 3 DAYS Performed at [facility], [address] [place], [place] [address]    Report Status PENDING  Incomplete  Blood culture (single)     Status: None (Preliminary result)   Collection Time: 06/27/21 10:08 AM   Specimen: BLOOD LEFT HAND  Result Value Ref Range Status   Specimen Description   Final    BLOOD LEFT HAND Performed at [facility], [address] [place], [place] [address]    Special Requests   Final    BOTTLES DRAWN AEROBIC AND ANAEROBIC Blood Culture adequate volume Performed at [facility], [address] [place], [place] [address]    Culture  Setup Time   Final    Organism ID to follow [place] CRITICAL RESULT CALLED TO, READ BACK BY AND VERIFIED WITH: KRISTIN MILLER 06/28/21 0741 BY [email or web address] Performed at [facility], [address], [place], [place] [address]    Culture   Final    CULTURE REINCUBATED FOR BETTER GROWTH Performed at [facility], [place] [address] [place], [place] [address]    Report Status PENDING  Incomplete  Blood Culture ID Panel (Reflexed)     Status: Abnormal   Collection Time: 06/27/21 10:08 AM  Result Value Ref Range Status   Enterococcus faecalis NOT DETECTED NOT DETECTED Final    Enterococcus Faecium NOT DETECTED NOT DETECTED Final   Listeria monocytogenes NOT DETECTED NOT DETECTED Final   Staphylococcus species NOT DETECTED NOT DETECTED Final   Staphylococcus aureus (BCID) NOT DETECTED NOT DETECTED Final   Staphylococcus epidermidis NOT DETECTED NOT DETECTED Final   Staphylococcus lugdunensis NOT DETECTED NOT DETECTED Final   Streptococcus species DETECTED (A) NOT DETECTED Final    Comment: Not Enterococcus species, Streptococcus agalactiae, Streptococcus pyogenes, or Streptococcus pneumoniae. CRITICAL RESULT CALLED TO, READ BACK BY AND VERIFIED WITH: KRISTIN MILLER 06/28/21 0741 MW    Streptococcus agalactiae NOT DETECTED NOT DETECTED Final   Streptococcus pneumoniae NOT DETECTED NOT DETECTED Final   Streptococcus pyogenes NOT DETECTED NOT DETECTED Final   A.calcoaceticus-baumannii NOT DETECTED NOT DETECTED Final   Bacteroides fragilis NOT DETECTED NOT DETECTED Final   Enterobacterales NOT DETECTED NOT DETECTED Final   Enterobacter cloacae complex NOT DETECTED NOT DETECTED Final   Escherichia coli NOT DETECTED NOT DETECTED Final   Klebsiella aerogenes NOT DETECTED NOT DETECTED Final   Klebsiella oxytoca NOT DETECTED NOT DETECTED Final   Klebsiella pneumoniae NOT DETECTED NOT DETECTED Final   Proteus species NOT DETECTED NOT DETECTED Final   Salmonella species NOT DETECTED NOT DETECTED Final   Serratia marcescens NOT DETECTED NOT DETECTED Final   Haemophilus influenzae NOT DETECTED NOT DETECTED Final   Neisseria meningitidis NOT DETECTED NOT DETECTED Final   Pseudomonas aeruginosa NOT DETECTED NOT DETECTED Final   Stenotrophomonas maltophilia NOT DETECTED NOT DETECTED Final   Candida albicans NOT DETECTED NOT DETECTED Final   Candida auris NOT DETECTED NOT DETECTED Final   Candida glabrata NOT DETECTED NOT DETECTED Final   Candida krusei NOT DETECTED NOT DETECTED Final  Candida parapsilosis NOT DETECTED NOT DETECTED Final   Candida tropicalis NOT DETECTED  NOT DETECTED Final   Cryptococcus neoformans/gattii NOT DETECTED NOT DETECTED Final    Comment: Performed at [facility], [address] [place], [place] [address]  MRSA Next Gen by PCR, Nasal     Status: None   Collection Time: 06/27/21 11:30 AM   Specimen: Nasal Mucosa; Nasal Swab  Result Value Ref Range Status   MRSA by PCR Next Gen NOT DETECTED NOT DETECTED Final    Comment: (NOTE) The GeneXpert MRSA Assay (FDA approved for NASAL specimens only), is one component of a comprehensive MRSA colonization surveillance program. It is not intended to diagnose MRSA infection nor to guide or monitor treatment for MRSA infections. Test performance is not FDA approved in patients less than 54 years old. Performed at [facility], [address] [place], [place] [address]   Body fluid culture w Gram Stain     Status: None (Preliminary result)   Collection Time: 06/27/21  3:00 PM   Specimen: PATH Cytology Peritoneal fluid  Result Value Ref Range Status   Specimen Description   Final    PERITONEAL Performed at [facility], [address], [place], [place] [address]    Special Requests   Final    NONE Performed at [facility], [address] [place], [place] [address]    Gram Stain   Final    RARE WBC PRESENT,BOTH PMN AND MONONUCLEAR NO ORGANISMS SEEN    Culture   Final    NO GROWTH 3 DAYS Performed at [facility], [place] [address], [place], [place] [address]    Report Status PENDING  Incomplete      Radiology Studies: No results found.   Scheduled Meds:  Chlorhexidine Gluconate Cloth  6 each Topical Daily   FLUoxetine  10 mg Oral Daily   levothyroxine  50 mcg Oral Q48H   levothyroxine  75 mcg Oral Q48H   lidocaine  1 patch Transdermal q1600   loratadine  10 mg Oral Daily   mouth rinse  15 mL Mouth Rinse BID   midodrine  10 mg Oral TID   simvastatin  20 mg Oral QHS   Continuous Infusions:  sodium chloride Stopped  (06/27/21 0819)   sodium chloride Stopped (06/27/21 2000)   albumin human 25 g (06/30/21 1212)   cefTRIAXone (ROCEPHIN)  IV Stopped (06/30/21 4356)   octreotide  (SANDOSTATIN)    IV infusion 50 mcg/hr (06/30/21 1200)     LOS: 3 days     Enzo Bi, MD [facility] If 7PM-7AM, please contact night-coverage 06/30/2021, 5:32 PM

## 2021-06-30 NOTE — Progress Notes (Signed)
Report given to receiving nurse Raquel Sarna.

## 2021-06-30 NOTE — Progress Notes (Signed)
PHARMACY CONSULT NOTE  Pharmacy Consult for Electrolyte Monitoring and Replacement   Recent Labs: Potassium (mmol/L)  Date Value  06/30/2021 3.7   Magnesium (mg/dL)  Date Value  06/30/2021 2.5 (H)   Calcium (mg/dL)  Date Value  06/30/2021 8.2 (L)   Calcium, Total (PTH) (mg/dL)  Date Value  09/07/2019 8.6 (L)   Albumin (g/dL)  Date Value  06/27/2021 2.9 (L)   Phosphorus (mg/dL)  Date Value  06/28/2021 6.5 (H)   Sodium (mmol/L)  Date Value  06/30/2021 138  03/31/2017 140   Corrected Ca 9.4 mg/dL  Assessment: 81 y.o. female with past medical history of CAD, CKD, AICD, cirrhosis, requiring recurrent paracentesis, multiple myeloma, here with fall. Pharmacy is asked to follow and replace electrolytes while in the  CCU.  Give albumin for SBP and hepatorenal   Goal of Therapy:  Electrolytes WNL  Plan:  No electrolyte replacement warranted for today Recheck electrolytes in am  Oswald Hillock ,PharmD Clinical Pharmacist 06/30/2021 8:43 AM

## 2021-06-30 NOTE — Progress Notes (Signed)
Heather Peterson , MD [address], [place], [place], [place], [address] [address], [place], [place], [place], [address] Phone: [phone]  Fax: [phone]   Heather Peterson is being followed for SBP, possible hepatorenal syndrome.   Subjective: No new complaints   Objective: Vital signs in last 24 hours: Vitals:   06/30/21 0600 06/30/21 0700 06/30/21 0800 06/30/21 0900  BP: 123/66 113/64 103/62 113/64  Pulse: 84 81 78 79  Resp: _0 Temp: 98.1 F (36.7 C) (!) 97 F (36.1 C) 97.7 F (36.5 C) (!) 97.3 F (36.3 C)  TempSrc: Bladder Bladder Bladder Bladder  SpO2: 93% 94% 96% 96%  Weight: 79 kg     Height:       Weight change: 1.2 kg  Intake/Output Summary (Last 24 hours) at 06/30/2021 5732 Last data filed at 06/30/2021 0915 Gross per 24 hour  Intake 1514.55 ml  Output 710 ml  Net 804.55 ml     Exam: Heart:: Regular rate and rhythm Lungs: normal Abdomen: distended, soft , non tender , no guarding or rigidty   Lab Results: _1 @ Micro Results: Recent Results (from the past 240 hour(s))  Resp Panel by RT-PCR (Flu A&B, Covid) Nasopharyngeal Swab     Status: None   Collection Time: 06/27/21  7:55 AM   Specimen: Nasopharyngeal Swab; Nasopharyngeal(NP) swabs in vial transport medium  Result Value Ref Range Status   SARS Coronavirus 2 by RT PCR NEGATIVE NEGATIVE Final    Comment: (NOTE) SARS-CoV-2 target nucleic acids are NOT DETECTED.  The SARS-CoV-2 RNA is generally detectable in upper respiratory specimens during the acute phase of infection. The lowest concentration of SARS-CoV-2 viral copies this assay can detect is 138 copies/mL. A negative result does not preclude SARS-Cov-2 infection and should not be used as the sole basis for treatment or other patient management decisions. A negative result may occur with  improper specimen collection/handling, submission of specimen other than nasopharyngeal swab, presence of viral mutation(s)  within the areas targeted by this assay, and inadequate number of viral copies(<138 copies/mL). A negative result must be combined with clinical observations, patient history, and epidemiological information. The expected result is Negative.  Fact Sheet for Patients:  EntrepreneurPulse.com.au  Fact Sheet for Healthcare Providers:  IncredibleEmployment.be  This test is no t yet approved or cleared by the [place] FDA and  has been authorized for detection and/or diagnosis of SARS-CoV-2 by FDA under an Emergency Use Authorization (EUA). This EUA will remain  in effect (meaning this test can be used) for the duration of the COVID-19 declaration under Section 564(b)(1) of the Act, 21 U.S.C.section 360bbb-3(b)(1), unless the authorization is terminated  or revoked sooner.       Influenza A by PCR NEGATIVE NEGATIVE Final   Influenza B by PCR NEGATIVE NEGATIVE Final    Comment: (NOTE) The Xpert Xpress SARS-CoV-2/FLU/RSV plus assay is intended as an aid in the diagnosis of influenza from Nasopharyngeal swab specimens and should not be used as a sole basis for treatment. Nasal washings and aspirates are unacceptable for Xpert Xpress SARS-CoV-2/FLU/RSV testing.  Fact Sheet for Patients: EntrepreneurPulse.com.au  Fact Sheet for Healthcare Providers: IncredibleEmployment.be  This test is not yet approved or cleared by the [place] FDA and has been authorized for detection and/or diagnosis of SARS-CoV-2 by FDA under an Emergency Use Authorization (EUA). This EUA will remain in effect (meaning this test can be used) for the duration of the COVID-19 declaration under Section 564(b)(1)  of the Act, 21 U.S.C. section 360bbb-3(b)(1), unless the authorization is terminated or revoked.  Performed at [facility], [address] [place], [place] [address]   Blood culture (single)     Status: None  (Preliminary result)   Collection Time: 06/27/21  7:55 AM   Specimen: BLOOD  Result Value Ref Range Status   Specimen Description BLOOD BLOOD RIGHT FOREARM  Final   Special Requests   Final    BOTTLES DRAWN AEROBIC AND ANAEROBIC Blood Culture results may not be optimal due to an excessive volume of blood received in culture bottles   Culture   Final    NO GROWTH 3 DAYS Performed at [facility], [address] [place], [place] [address]    Report Status PENDING  Incomplete  Blood culture (single)     Status: None (Preliminary result)   Collection Time: 06/27/21 10:08 AM   Specimen: BLOOD LEFT HAND  Result Value Ref Range Status   Specimen Description   Final    BLOOD LEFT HAND Performed at [facility], [address] [place], [place] [address]    Special Requests   Final    BOTTLES DRAWN AEROBIC AND ANAEROBIC Blood Culture adequate volume Performed at [facility], [address] [place], [place] [address]    Culture  Setup Time   Final    Organism ID to follow [place] TO, READ BACK BY AND VERIFIED WITH: KRISTIN MILLER 06/28/21 0741 BY [email or web address] Performed at [facility], [address], [place], [place] [address]    Culture   Final    GRAM POSITIVE COCCI IDENTIFICATION AND SUSCEPTIBILITIES TO FOLLOW Performed at [facility], [place] [address], [place], [place] [address]    Report Status PENDING  Incomplete  Blood Culture ID Panel (Reflexed)     Status: Abnormal   Collection Time: 06/27/21 10:08 AM  Result Value Ref Range Status   Enterococcus faecalis NOT DETECTED NOT DETECTED Final   Enterococcus Faecium NOT DETECTED NOT DETECTED Final   Listeria monocytogenes NOT DETECTED NOT DETECTED Final   Staphylococcus species NOT DETECTED NOT DETECTED Final   Staphylococcus aureus (BCID) NOT DETECTED NOT DETECTED Final   Staphylococcus epidermidis NOT DETECTED NOT DETECTED Final    Staphylococcus lugdunensis NOT DETECTED NOT DETECTED Final   Streptococcus species DETECTED (A) NOT DETECTED Final    Comment: Not Enterococcus species, Streptococcus agalactiae, Streptococcus pyogenes, or Streptococcus pneumoniae. CRITICAL RESULT CALLED TO, READ BACK BY AND VERIFIED WITH: KRISTIN MILLER 06/28/21 0741 MW    Streptococcus agalactiae NOT DETECTED NOT DETECTED Final   Streptococcus pneumoniae NOT DETECTED NOT DETECTED Final   Streptococcus pyogenes NOT DETECTED NOT DETECTED Final   A.calcoaceticus-baumannii NOT DETECTED NOT DETECTED Final   Bacteroides fragilis NOT DETECTED NOT DETECTED Final   Enterobacterales NOT DETECTED NOT DETECTED Final   Enterobacter cloacae complex NOT DETECTED NOT DETECTED Final   Escherichia coli NOT DETECTED NOT DETECTED Final   Klebsiella aerogenes NOT DETECTED NOT DETECTED Final   Klebsiella oxytoca NOT DETECTED NOT DETECTED Final   Klebsiella pneumoniae NOT DETECTED NOT DETECTED Final   Proteus species NOT DETECTED NOT DETECTED Final   Salmonella species NOT DETECTED NOT DETECTED Final   Serratia marcescens NOT DETECTED NOT DETECTED Final   Haemophilus influenzae NOT DETECTED NOT DETECTED Final   Neisseria meningitidis NOT DETECTED NOT DETECTED Final   Pseudomonas aeruginosa NOT DETECTED NOT DETECTED Final   Stenotrophomonas maltophilia NOT DETECTED  NOT DETECTED Final   Candida albicans NOT DETECTED NOT DETECTED Final   Candida auris NOT DETECTED NOT DETECTED Final   Candida glabrata NOT DETECTED NOT DETECTED Final   Candida krusei NOT DETECTED NOT DETECTED Final   Candida parapsilosis NOT DETECTED NOT DETECTED Final   Candida tropicalis NOT DETECTED NOT DETECTED Final   Cryptococcus neoformans/gattii NOT DETECTED NOT DETECTED Final    Comment: Performed at [facility], [address] [place], [place] [address]  MRSA Next Gen by PCR, Nasal     Status: None   Collection Time: 06/27/21 11:30 AM   Specimen: Nasal Mucosa; Nasal  Swab  Result Value Ref Range Status   MRSA by PCR Next Gen NOT DETECTED NOT DETECTED Final    Comment: (NOTE) The GeneXpert MRSA Assay (FDA approved for NASAL specimens only), is one component of a comprehensive MRSA colonization surveillance program. It is not intended to diagnose MRSA infection nor to guide or monitor treatment for MRSA infections. Test performance is not FDA approved in patients less than 61 years old. Performed at [facility], [address] [place], [place] [address]   Body fluid culture w Gram Stain     Status: None (Preliminary result)   Collection Time: 06/27/21  3:00 PM   Specimen: PATH Cytology Peritoneal fluid  Result Value Ref Range Status   Specimen Description   Final    PERITONEAL Performed at [facility], [address], [place], [place] [address]    Special Requests   Final    NONE Performed at [facility], [address] [place], [place] [address]    Gram Stain   Final    RARE WBC PRESENT,BOTH PMN AND MONONUCLEAR NO ORGANISMS SEEN    Culture   Final    NO GROWTH 2 DAYS Performed at [facility], [place] [address] [place], [place] [address]    Report Status PENDING  Incomplete   Studies/Results: No results found. Medications: I have reviewed the patient's current medications. Scheduled Meds:  Chlorhexidine Gluconate Cloth  6 each Topical Daily   FLUoxetine  10 mg Oral Daily   levothyroxine  50 mcg Oral Q48H   levothyroxine  75 mcg Oral Q48H   lidocaine  1 patch Transdermal q1600   loratadine  10 mg Oral Daily   mouth rinse  15 mL Mouth Rinse BID   midodrine  10 mg Oral TID   simvastatin  20 mg Oral QHS   Continuous Infusions:  sodium chloride Stopped (06/27/21 0819)   sodium chloride Stopped (06/27/21 2000)   albumin human 25 g (06/29/21 0953)   cefTRIAXone (ROCEPHIN)  IV 2 g (06/30/21 0920)   octreotide  (SANDOSTATIN)    IV infusion 50 mcg/hr (06/30/21 0915)   PRN Meds:.acetaminophen,  morphine injection, polyethylene glycol, sodium chloride   Assessment: Principal Problem:   Hypotension  Heather Peterson is a 81 y.o. 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  Decompensated liver cirrhosis with possible SBP and associated hepatic encephalopathy. .Ascitic fluid tapshowed a total cell count of 1600 with a neutrophil count of 65% which meets criteria for SBP. Culture so far negative. Denies any hematemesis or melena.  She has had a fibula fracture questionable blood loss. overt encephalopathy secondary to the infection.  Her blood pressure still in the hypotensive range.  She is on oxygen.No evidence of GI bleed. Hb stable.  Family has been discussed with about high mortality associated with this condition.  Renal function is improving     Plan 1.  Continue antibiotics for SBP for 7 days and assess clinically if improving can stop if not we will need repeat diagnostic paracentesis. At this time clinically she is improving 2.  .  Daily 25 g of albumin due to AKI 3.  Continue daily octreotide and midodrine due to renal failure 4.  Follow-up ascites fluid cultures and change antibiotics accordingly if needed 5.   After his acute episode will require lifelong prophylaxis for SBP 6. Still has ascites - can drain PRN with albumin administered 25 grams of 25 % every time fluid taken out.       LOS: 3 days   Heather Bellows, MD 06/30/2021, 9:33 AM

## 2021-06-30 NOTE — Plan of Care (Signed)
Continuing with plan of care. 

## 2021-07-01 DIAGNOSIS — K7682 Hepatic encephalopathy: Secondary | ICD-10-CM

## 2021-07-01 LAB — BASIC METABOLIC PANEL
Anion gap: 14 (ref 5–15)
BUN: 53 mg/dL — ABNORMAL HIGH (ref 8–23)
CO2: 21 mmol/L — ABNORMAL LOW (ref 22–32)
Calcium: 8.3 mg/dL — ABNORMAL LOW (ref 8.9–10.3)
Chloride: 102 mmol/L (ref 98–111)
Creatinine, Ser: 2.31 mg/dL — ABNORMAL HIGH (ref 0.44–1.00)
GFR, Estimated: 21 mL/min — ABNORMAL LOW (ref >60)
Glucose, Bld: 124 mg/dL — ABNORMAL HIGH (ref 70–99)
Potassium: 3.7 mmol/L (ref 3.5–5.1)
Sodium: 137 mmol/L (ref 135–145)

## 2021-07-01 LAB — CBC
HCT: 29.8 % — ABNORMAL LOW (ref 36.0–46.0)
Hemoglobin: 9.1 g/dL — ABNORMAL LOW (ref 12.0–15.0)
MCH: 28.3 pg (ref 26.0–34.0)
MCHC: 30.5 g/dL (ref 30.0–36.0)
MCV: 92.5 fL (ref 80.0–100.0)
Platelets: 167 10*3/uL (ref 150–400)
RBC: 3.22 MIL/uL — ABNORMAL LOW (ref 3.87–5.11)
RDW: 21 % — ABNORMAL HIGH (ref 11.5–15.5)
WBC: 8.4 10*3/uL (ref 4.0–10.5)
nRBC: 1.1 % — ABNORMAL HIGH (ref 0.0–0.2)

## 2021-07-01 LAB — BODY FLUID CULTURE W GRAM STAIN: Culture: NO GROWTH

## 2021-07-01 LAB — CULTURE, BLOOD (SINGLE): Special Requests: ADEQUATE

## 2021-07-01 LAB — MAGNESIUM: Magnesium: 2.6 mg/dL — ABNORMAL HIGH (ref 1.7–2.4)

## 2021-07-01 NOTE — Progress Notes (Signed)
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 Per EMS upon their arrival at pts home pt hypotensive bp 84/48, therefore 500 ml iv fluid bolus administered.  She was also placed on 3L O2 via nasal canula (wears prn home O2).    In the ED, pt was found hemoccult positive, therefore received 1 unit of pRBC's.  GI consulted Dr. Vicente Males recommended starting empiric abx, octreotide, and protonix.  CT Head negative for acute abnormality.  CT Cervical Spine revealed no evidence of acute fracture or traumatic malalignment, however concerned for emphysema and possible mild interstitial edema vs. small layering right pleural effusion.  Left tibia/fibula xray revealed acute fracture of the proximal metadiaphysis of the fibula.  Orthopedic surgeon Dr. Roland Rack consulted per ED MD with recommendation to place a knee immobilizer for now. PCCM team contacted for ICU admission due to possible need of vasopressor therapy due to hypotension.  Pt was on Levo for just 1 day and was weaned off and transferred to [facility] on 2/17.  Hypotension, chronic, secondary to  Liver cirrhosis, chronic  Possible acute on chronic anemia , hypovolemic --Outpatient records appear SBP high 80s-low 100s. --briefly on Levo for a day.   Plan: --cont home midodrine 10 mg TID  Sepsis, ruled out --did not meet criteria  SBP --Ascitic fluid tap 3.2L showed a total  cell count of 1600 with a neutrophil count of 65% which meets criteria for SBP. Culture so far negative.  --GI consulted Plan: --cont ceftriaxone, day 5 of 7 --IV albumin and octreotide, per GI --will require lifelong prophylaxis for SBP, per GI  Recurrent ascites --consider paracentesis prior to discharge   Decompensated liver cirrhosis  --GI consulted, discussed with family the high mortality   Acute toxic encephalopathy 2/2 infection --treat SBP  Acute closed nondisplaced left proximal fibular fracture --ortho consulted, Dr. Roland Rack --knee immobilizer applied --weight-bear as tolerated on the left leg --follow-up in ortho office in 5 to 6 weeks for re-evaluation. --PT/OT --to SNF rehab  Chronic combined systolic and diastolic CHF~EF 09% Hx: Pacemaker, Ventricular Fibrillation/Tachycardia s/p Dual Chamber ICD, HTN, and Cardiomyopathy  --due to hypotension, hold mexitil and torsemide for now   Acute on chronic hypoxic respiratory failure secondary to COPD  Small right pleural effusion along with atelectasis  - on 2L O2 chronically  --Continue supplemental O2 to keep sats between 88-92%  AKI vs progression of CKD Stage IV  --Cr 2.49 on presentation.  Cr ~2.2 in the past month.   Acute on chronic anemia with hx of angiodysplastic lesions  - Hgb 8-9's - GI consulted appreciate input~continue octreotide/protonix gtts and once stable will proceed with EGD  --monitor Hgb and transfuse to keep Hgb >8   Hypothyroidism  - cont home Synthroid   Multiple myeloma  - Oncology consulted, Dr. Grayland Ormond --Patient has not received treatment since February 09, 2021.  No further treatments are planned at this time, per onc. --Recommend palliative care  consult.   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 01/11/2021 with no significant improvement in back or right leg pain  --PT/OT  Depression --cont home Prozac   DVT prophylaxis:  SCD/Compression stockings Code Status: DNR  Family Communication: daughter and husband updated at bedside today  Level of care: Med-Surg Dispo:   The patient is from: home Anticipated d/c is to: SNF rehab Anticipated d/c date is: 2-3 days Patient currently is not medically ready to d/c due to: SBP need 7 days of IV abx   Subjective and Interval History:  Daughter was concerned about pt sleeping all the time, however, pt was able to get up to eat breakfast, get a bath and sit in chair this morning.  D/c foley today.   Objective: Vitals:   07/01/21 0500 07/01/21 0844 07/01/21 1659 07/01/21 1943  BP:  (!) 117/54 101/71 109/79  Pulse:  88 84 86  Resp:  18 18 18   Temp:  (!) 97.5 F (36.4 C) 98.2 F (36.8 C) 97.9 F (36.6 C)  TempSrc:  Oral Oral Oral  SpO2:  99% 98% 97%  Weight: 82.6 kg     Height:        Intake/Output Summary (Last 24 hours) at 07/01/2021 2007 Last data filed at 07/01/2021 1716 Gross per 24 hour  Intake 783.68 ml  Output 760 ml  Net 23.68 ml   Filed Weights   06/29/21 0500 06/30/21 0600 07/01/21 0500  Weight: 77.8 kg 79 kg 82.6 kg    Examination:   Constitutional: NAD, sleeping, but arousable HEENT: conjunctivae and lids normal, EOMI CV: No cyanosis.   RESP: normal respiratory effort, on 2L GI: abdomen large Extremities: No effusions, edema in BLE SKIN: warm, dry Foley present  Data Reviewed: I have personally reviewed following labs and imaging studies  CBC: Recent Labs  Lab 06/26/21 1813 06/27/21 0634 06/27/21 1336 06/27/21 2247 06/28/21 0453 06/29/21 0354 06/30/21 0414 07/01/21 0839  WBC 11.0* 8.9  --   --  8.1 5.6 5.0 8.4  NEUTROABS 8.9* 6.4  --   --   --   --   --   --   HGB 7.4* 6.7*   < > 9.2* 9.2* 8.9* 8.8* 9.1*  HCT 25.4* 22.9*   < > 29.3* 29.4* 28.2* 28.9* 29.8*  MCV 94.8 95.0  --   --  89.6 91.0 92.0 92.5  PLT 231 211  --   --  174 149* 159 167   < > = values in this interval not displayed.   Basic Metabolic  Panel: Recent Labs  Lab 06/27/21 0634 06/27/21 1336 06/28/21 0453 06/29/21 0354 06/30/21 0414 07/01/21 0839  NA 135  --  138 138 138 137  K 4.1  --  4.1 3.7 3.7 3.7  CL 101  --  104 105 105 102  CO2 25  --  22 22 23  21*  GLUCOSE 94  --  126* 121* 121* 124*  BUN 50*  --  51* 50* 53* 53*  CREATININE 2.53*  --  2.60* 2.50* 2.33* 2.31*  CALCIUM 8.5*  --  8.2* 8.2* 8.2* 8.3*  MG  --  2.3 2.4 2.5* 2.5* 2.6*  PHOS  --  6.6* 6.5*  --   --   --    GFR: Estimated Creatinine Clearance: 20.1 mL/min (A) (by C-G formula based on SCr of 2.31 mg/dL (H)). Liver Function Tests: Recent Labs  Lab 06/26/21 1813 06/27/21 3559  AST 18 17  ALT 9 8  ALKPHOS 114 104  BILITOT 0.6 0.7  PROT 6.5 6.0*  ALBUMIN 3.3* 2.9*   Recent Labs  Lab 06/27/21 0634  LIPASE 49   Recent Labs  Lab 06/26/21 2108  AMMONIA 18   Coagulation Profile: Recent Labs  Lab 06/27/21 1336  INR 1.5*   Cardiac Enzymes: No results for input(s): CKTOTAL, CKMB, CKMBINDEX, TROPONINI in the last 168 hours. BNP (last 3 results) No results for input(s): PROBNP in the last 8760 hours. HbA1C: No results for input(s): HGBA1C in the last 72 hours. CBG: Recent Labs  Lab 06/27/21 1127  GLUCAP 87   Lipid Profile: No results for input(s): CHOL, HDL, LDLCALC, TRIG, CHOLHDL, LDLDIRECT in the last 72 hours. Thyroid Function Tests: No results for input(s): TSH, T4TOTAL, FREET4, T3FREE, THYROIDAB in the last 72 hours.  Anemia Panel: No results for input(s): VITAMINB12, FOLATE, FERRITIN, TIBC, IRON, RETICCTPCT in the last 72 hours.  Sepsis Labs: Recent Labs  Lab 06/26/21 1813 06/27/21 0634 06/27/21 0755 06/27/21 1008  PROCALCITON  --   --  0.32  --   LATICACIDVEN 1.1 1.2  --  1.2    Recent Results (from the past 240 hour(s))  Resp Panel by RT-PCR (Flu A&B, Covid) Nasopharyngeal Swab     Status: None   Collection Time: 06/27/21  7:55 AM   Specimen: Nasopharyngeal Swab; Nasopharyngeal(NP) swabs in vial transport  medium  Result Value Ref Range Status   SARS Coronavirus 2 by RT PCR NEGATIVE NEGATIVE Final    Comment: (NOTE) SARS-CoV-2 target nucleic acids are NOT DETECTED.  The SARS-CoV-2 RNA is generally detectable in upper respiratory specimens during the acute phase of infection. The lowest concentration of SARS-CoV-2 viral copies this assay can detect is 138 copies/mL. A negative result does not preclude SARS-Cov-2 infection and should not be used as the sole basis for treatment or other patient management decisions. A negative result may occur with  improper specimen collection/handling, submission of specimen other than nasopharyngeal swab, presence of viral mutation(s) within the areas targeted by this assay, and inadequate number of viral copies(<138 copies/mL). A negative result must be combined with clinical observations, patient history, and epidemiological information. The expected result is Negative.  Fact Sheet for Patients:  EntrepreneurPulse.com.au  Fact Sheet for Healthcare Providers:  IncredibleEmployment.be  This test is no t yet approved or cleared by the [place] FDA and  has been authorized for detection and/or diagnosis of SARS-CoV-2 by FDA under an Emergency Use Authorization (EUA). This EUA will remain  in effect (meaning this test can be used) for the duration of the COVID-19 declaration under Section 564(b)(1) of the Act, 21 U.S.C.section 360bbb-3(b)(1), unless the authorization is terminated  or revoked sooner.       Influenza A by PCR NEGATIVE NEGATIVE Final   Influenza B by PCR NEGATIVE NEGATIVE Final    Comment: (NOTE) The Xpert Xpress SARS-CoV-2/FLU/RSV plus assay is intended as an aid in the diagnosis of influenza from Nasopharyngeal swab specimens and should not be used as a sole basis for treatment. Nasal washings and aspirates are unacceptable for Xpert Xpress SARS-CoV-2/FLU/RSV testing.  Fact Sheet for  Patients: EntrepreneurPulse.com.au  Fact Sheet for Healthcare Providers: IncredibleEmployment.be  This test is not yet approved or cleared by the [place] FDA and has been authorized for detection and/or diagnosis of SARS-CoV-2 by FDA under an Emergency Use Authorization (EUA). This EUA will remain in effect (meaning this test can be used) for the duration  of the COVID-19 declaration under Section 564(b)(1) of the Act, 21 U.S.C. section 360bbb-3(b)(1), unless the authorization is terminated or revoked.  Performed at [facility], [address] [place], [place] [address]   Blood culture (single)     Status: None (Preliminary result)   Collection Time: 06/27/21  7:55 AM   Specimen: BLOOD  Result Value Ref Range Status   Specimen Description BLOOD BLOOD RIGHT FOREARM  Final   Special Requests   Final    BOTTLES DRAWN AEROBIC AND ANAEROBIC Blood Culture results may not be optimal due to an excessive volume of blood received in culture bottles   Culture   Final    NO GROWTH 4 DAYS Performed at [facility], [address] [place], [place] [address]    Report Status PENDING  Incomplete  Blood culture (single)     Status: Abnormal   Collection Time: 06/27/21 10:08 AM   Specimen: BLOOD LEFT HAND  Result Value Ref Range Status   Specimen Description   Final    BLOOD LEFT HAND Performed at [facility], [address], [place], [place] [address]    Special Requests   Final    BOTTLES DRAWN AEROBIC AND ANAEROBIC Blood Culture adequate volume Performed at [facility], [address] [place], [place] [address]    Culture  Setup Time   Final    Organism ID to follow [place] TO, READ BACK BY AND VERIFIED WITH: KRISTIN MILLER 06/28/21 0741 BY [email or web address] Performed at [facility], [address] [place],  [address]    Culture  STREPTOCOCCUS PARASANGUINIS (A)  Final   Report Status 07/01/2021 FINAL  Final   Organism ID, Bacteria STREPTOCOCCUS PARASANGUINIS  Final      Susceptibility   Streptococcus parasanguinis - MIC*    PENICILLIN 0.25 INTERMEDIATE Intermediate     CEFTRIAXONE 0.5 SENSITIVE Sensitive     LEVOFLOXACIN >=16 RESISTANT Resistant     VANCOMYCIN 0.5 SENSITIVE Sensitive     * STREPTOCOCCUS PARASANGUINIS  Blood Culture ID Panel (Reflexed)     Status: Abnormal   Collection Time: 06/27/21 10:08 AM  Result Value Ref Range Status   Enterococcus faecalis NOT DETECTED NOT DETECTED Final   Enterococcus Faecium NOT DETECTED NOT DETECTED Final   Listeria monocytogenes NOT DETECTED NOT DETECTED Final   Staphylococcus species NOT DETECTED NOT DETECTED Final   Staphylococcus aureus (BCID) NOT DETECTED NOT DETECTED Final   Staphylococcus epidermidis NOT DETECTED NOT DETECTED Final   Staphylococcus lugdunensis NOT DETECTED NOT DETECTED Final   Streptococcus species DETECTED (A) NOT DETECTED Final    Comment: Not Enterococcus species, Streptococcus agalactiae, Streptococcus pyogenes, or Streptococcus pneumoniae. CRITICAL RESULT CALLED TO, READ BACK BY AND VERIFIED WITH: KRISTIN MILLER 06/28/21 0741 MW    Streptococcus agalactiae NOT DETECTED NOT DETECTED Final   Streptococcus pneumoniae NOT DETECTED NOT DETECTED Final   Streptococcus pyogenes NOT DETECTED NOT DETECTED Final   A.calcoaceticus-baumannii NOT DETECTED NOT DETECTED Final   Bacteroides fragilis NOT DETECTED NOT DETECTED Final   Enterobacterales NOT DETECTED NOT DETECTED Final   Enterobacter cloacae complex NOT DETECTED NOT DETECTED Final   Escherichia coli NOT DETECTED NOT DETECTED Final   Klebsiella aerogenes NOT DETECTED NOT DETECTED Final   Klebsiella oxytoca NOT DETECTED NOT DETECTED Final   Klebsiella pneumoniae NOT DETECTED NOT DETECTED Final   Proteus species NOT DETECTED NOT DETECTED Final   Salmonella species NOT DETECTED NOT DETECTED  Final  Serratia marcescens NOT DETECTED NOT DETECTED Final   Haemophilus influenzae NOT DETECTED NOT DETECTED Final   Neisseria meningitidis NOT DETECTED NOT DETECTED Final   Pseudomonas aeruginosa NOT DETECTED NOT DETECTED Final   Stenotrophomonas maltophilia NOT DETECTED NOT DETECTED Final   Candida albicans NOT DETECTED NOT DETECTED Final   Candida auris NOT DETECTED NOT DETECTED Final   Candida glabrata NOT DETECTED NOT DETECTED Final   Candida krusei NOT DETECTED NOT DETECTED Final   Candida parapsilosis NOT DETECTED NOT DETECTED Final   Candida tropicalis NOT DETECTED NOT DETECTED Final   Cryptococcus neoformans/gattii NOT DETECTED NOT DETECTED Final    Comment: Performed at [facility], [address] [place], [place] [address]  MRSA Next Gen by PCR, Nasal     Status: None   Collection Time: 06/27/21 11:30 AM   Specimen: Nasal Mucosa; Nasal Swab  Result Value Ref Range Status   MRSA by PCR Next Gen NOT DETECTED NOT DETECTED Final    Comment: (NOTE) The GeneXpert MRSA Assay (FDA approved for NASAL specimens only), is one component of a comprehensive MRSA colonization surveillance program. It is not intended to diagnose MRSA infection nor to guide or monitor treatment for MRSA infections. Test performance is not FDA approved in patients less than 29 years old. Performed at [facility], [address] [place], [place] [address]   Body fluid culture w Gram Stain     Status: None   Collection Time: 06/27/21  3:00 PM   Specimen: PATH Cytology Peritoneal fluid  Result Value Ref Range Status   Specimen Description   Final    PERITONEAL Performed at [facility], [address], [place], [place] [address]    Special Requests   Final    NONE Performed at [facility], [address] [place], [place] [address]    Gram Stain   Final    RARE WBC PRESENT,BOTH PMN AND MONONUCLEAR NO ORGANISMS SEEN    Culture   Final    NO GROWTH 3  DAYS Performed at [facility], [place] [address] [place],  [address]    Report Status 07/01/2021 FINAL  Final      Radiology Studies: No results found.   Scheduled Meds:  Chlorhexidine Gluconate Cloth  6 each Topical Daily   FLUoxetine  10 mg Oral Daily   levothyroxine  50 mcg Oral Q48H   levothyroxine  75 mcg Oral Q48H   lidocaine  1 patch Transdermal q1600   loratadine  10 mg Oral Daily   mouth rinse  15 mL Mouth Rinse BID   midodrine  10 mg Oral TID   simvastatin  20 mg Oral QHS   Continuous Infusions:  sodium chloride Stopped (06/27/21 0819)   sodium chloride Stopped (06/27/21 2000)   albumin human 25 g (07/01/21 0938)   cefTRIAXone (ROCEPHIN)  IV 2 g (07/01/21 0851)   octreotide  (SANDOSTATIN)    IV infusion 50 mcg/hr (07/01/21 1144)     LOS: 4 days     Enzo Bi, MD [facility] If 7PM-7AM, please contact night-coverage 07/01/2021, 8:07 PM

## 2021-07-01 NOTE — Progress Notes (Signed)
Heather Peterson , MD [address], [place], [place], [place], [address] [address], [place], [place], [place], [address] Phone: [phone]  Fax: [phone]   Heather Peterson is being followed for decompensated liver cirrhosis with SBP   Subjective: Appears comfortable , still confused, denies any complaints   Objective: Vital signs in last 24 hours: Vitals:   06/30/21 2010 07/01/21 0454 07/01/21 0500 07/01/21 0844  BP: 91/69 108/74  (!) 117/54  Pulse: 78 82  88  Resp: 20 20  18   Temp: 97.7 F (36.5 C) 97.8 F (36.6 C)  (!) 97.5 F (36.4 C)  TempSrc: Oral Oral  Oral  SpO2: 99% 98%  99%  Weight:   82.6 kg   Height:       Weight change: 3.6 kg  Intake/Output Summary (Last 24 hours) at 07/01/2021 6546 Last data filed at 07/01/2021 5035 Gross per 24 hour  Intake 687.95 ml  Output 425 ml  Net 262.95 ml     Exam: Heart:: Regular rate and rhythm or S1S2 present Lungs: normal Abdomen: soft, nontender, general distension , normal bowel sounds   Lab Results: @LABTEST2 @ Micro Results: Recent Results (from the past 240 hour(s))  Resp Panel by RT-PCR (Flu A&B, Covid) Nasopharyngeal Swab     Status: None   Collection Time: 06/27/21  7:55 AM   Specimen: Nasopharyngeal Swab; Nasopharyngeal(NP) swabs in vial transport medium  Result Value Ref Range Status   SARS Coronavirus 2 by RT PCR NEGATIVE NEGATIVE Final    Comment: (NOTE) SARS-CoV-2 target nucleic acids are NOT DETECTED.  The SARS-CoV-2 RNA is generally detectable in upper respiratory specimens during the acute phase of infection. The lowest concentration of SARS-CoV-2 viral copies this assay can detect is 138 copies/mL. A negative result does not preclude SARS-Cov-2 infection and should not be used as the sole basis for treatment or other patient management decisions. A negative result may occur with  improper specimen collection/handling, submission of specimen other than nasopharyngeal swab, presence  of viral mutation(s) within the areas targeted by this assay, and inadequate number of viral copies(<138 copies/mL). A negative result must be combined with clinical observations, patient history, and epidemiological information. The expected result is Negative.  Fact Sheet for Patients:  EntrepreneurPulse.com.au  Fact Sheet for Healthcare Providers:  IncredibleEmployment.be  This test is no t yet approved or cleared by the [place] FDA and  has been authorized for detection and/or diagnosis of SARS-CoV-2 by FDA under an Emergency Use Authorization (EUA). This EUA will remain  in effect (meaning this test can be used) for the duration of the COVID-19 declaration under Section 564(b)(1) of the Act, 21 U.S.C.section 360bbb-3(b)(1), unless the authorization is terminated  or revoked sooner.       Influenza A by PCR NEGATIVE NEGATIVE Final   Influenza B by PCR NEGATIVE NEGATIVE Final    Comment: (NOTE) The Xpert Xpress SARS-CoV-2/FLU/RSV plus assay is intended as an aid in the diagnosis of influenza from Nasopharyngeal swab specimens and should not be used as a sole basis for treatment. Nasal washings and aspirates are unacceptable for Xpert Xpress SARS-CoV-2/FLU/RSV testing.  Fact Sheet for Patients: EntrepreneurPulse.com.au  Fact Sheet for Healthcare Providers: IncredibleEmployment.be  This test is not yet approved or cleared by the [place] FDA and has been authorized for detection and/or diagnosis of SARS-CoV-2 by FDA under an Emergency Use Authorization (EUA). This EUA will remain in effect (meaning this test can be used) for the duration of the COVID-19  declaration under Section 564(b)(1) of the Act, 21 U.S.C. section 360bbb-3(b)(1), unless the authorization is terminated or revoked.  Performed at [facility], [address] [place], [place] [address]   Blood culture (single)      Status: None (Preliminary result)   Collection Time: 06/27/21  7:55 AM   Specimen: BLOOD  Result Value Ref Range Status   Specimen Description BLOOD BLOOD RIGHT FOREARM  Final   Special Requests   Final    BOTTLES DRAWN AEROBIC AND ANAEROBIC Blood Culture results may not be optimal due to an excessive volume of blood received in culture bottles   Culture   Final    NO GROWTH 4 DAYS Performed at [facility], [address], [place], [place] [address]    Report Status PENDING  Incomplete  Blood culture (single)     Status: None (Preliminary result)   Collection Time: 06/27/21 10:08 AM   Specimen: BLOOD LEFT HAND  Result Value Ref Range Status   Specimen Description   Final    BLOOD LEFT HAND Performed at [facility], [address] [place], [place] [address]    Special Requests   Final    BOTTLES DRAWN AEROBIC AND ANAEROBIC Blood Culture adequate volume Performed at [facility], [address], [place], [place] [address]    Culture  Setup Time   Final    Organism ID to follow [place] TO, READ BACK BY AND VERIFIED WITH: KRISTIN MILLER 06/28/21 0741 BY [email or web address] Performed at [facility], [address] [place], [place] [address]    Culture   Final    CULTURE REINCUBATED FOR BETTER GROWTH Performed at [facility], [place] [address], [place], [place] [address]    Report Status PENDING  Incomplete  Blood Culture ID Panel (Reflexed)     Status: Abnormal   Collection Time: 06/27/21 10:08 AM  Result Value Ref Range Status   Enterococcus faecalis NOT DETECTED NOT DETECTED Final   Enterococcus Faecium NOT DETECTED NOT DETECTED Final   Listeria monocytogenes NOT DETECTED NOT DETECTED Final   Staphylococcus species NOT DETECTED NOT DETECTED Final   Staphylococcus aureus (BCID) NOT DETECTED NOT DETECTED Final   Staphylococcus epidermidis NOT DETECTED NOT DETECTED Final    Staphylococcus lugdunensis NOT DETECTED NOT DETECTED Final   Streptococcus species DETECTED (A) NOT DETECTED Final    Comment: Not Enterococcus species, Streptococcus agalactiae, Streptococcus pyogenes, or Streptococcus pneumoniae. CRITICAL RESULT CALLED TO, READ BACK BY AND VERIFIED WITH: KRISTIN MILLER 06/28/21 0741 MW    Streptococcus agalactiae NOT DETECTED NOT DETECTED Final   Streptococcus pneumoniae NOT DETECTED NOT DETECTED Final   Streptococcus pyogenes NOT DETECTED NOT DETECTED Final   A.calcoaceticus-baumannii NOT DETECTED NOT DETECTED Final   Bacteroides fragilis NOT DETECTED NOT DETECTED Final   Enterobacterales NOT DETECTED NOT DETECTED Final   Enterobacter cloacae complex NOT DETECTED NOT DETECTED Final   Escherichia coli NOT DETECTED NOT DETECTED Final   Klebsiella aerogenes NOT DETECTED NOT DETECTED Final   Klebsiella oxytoca NOT DETECTED NOT DETECTED Final   Klebsiella pneumoniae NOT DETECTED NOT DETECTED Final   Proteus species NOT DETECTED NOT DETECTED Final   Salmonella species NOT DETECTED NOT DETECTED Final   Serratia marcescens NOT DETECTED NOT DETECTED Final   Haemophilus influenzae NOT DETECTED NOT DETECTED Final   Neisseria meningitidis NOT DETECTED NOT DETECTED Final   Pseudomonas aeruginosa NOT DETECTED NOT DETECTED Final   Stenotrophomonas maltophilia NOT  DETECTED NOT DETECTED Final   Candida albicans NOT DETECTED NOT DETECTED Final   Candida auris NOT DETECTED NOT DETECTED Final   Candida glabrata NOT DETECTED NOT DETECTED Final   Candida krusei NOT DETECTED NOT DETECTED Final   Candida parapsilosis NOT DETECTED NOT DETECTED Final   Candida tropicalis NOT DETECTED NOT DETECTED Final   Cryptococcus neoformans/gattii NOT DETECTED NOT DETECTED Final    Comment: Performed at [facility], [address] [place], [place] [address]  MRSA Next Gen by PCR, Nasal     Status: None   Collection Time: 06/27/21 11:30 AM   Specimen: Nasal Mucosa; Nasal  Swab  Result Value Ref Range Status   MRSA by PCR Next Gen NOT DETECTED NOT DETECTED Final    Comment: (NOTE) The GeneXpert MRSA Assay (FDA approved for NASAL specimens only), is one component of a comprehensive MRSA colonization surveillance program. It is not intended to diagnose MRSA infection nor to guide or monitor treatment for MRSA infections. Test performance is not FDA approved in patients less than 9 years old. Performed at [facility], [address] [place], [place] [address]   Body fluid culture w Gram Stain     Status: None (Preliminary result)   Collection Time: 06/27/21  3:00 PM   Specimen: PATH Cytology Peritoneal fluid  Result Value Ref Range Status   Specimen Description   Final    PERITONEAL Performed at [facility], [address], [place], [place] [address]    Special Requests   Final    NONE Performed at [facility], [address] [place], [place] [address]    Gram Stain   Final    RARE WBC PRESENT,BOTH PMN AND MONONUCLEAR NO ORGANISMS SEEN    Culture   Final    NO GROWTH 3 DAYS Performed at [facility], [place] [address] [place], [place] [address]    Report Status PENDING  Incomplete   Studies/Results: No results found. Medications: I have reviewed the patient's current medications. Scheduled Meds:  Chlorhexidine Gluconate Cloth  6 each Topical Daily   FLUoxetine  10 mg Oral Daily   levothyroxine  50 mcg Oral Q48H   levothyroxine  75 mcg Oral Q48H   lidocaine  1 patch Transdermal q1600   loratadine  10 mg Oral Daily   mouth rinse  15 mL Mouth Rinse BID   midodrine  10 mg Oral TID   simvastatin  20 mg Oral QHS   Continuous Infusions:  sodium chloride Stopped (06/27/21 0819)   sodium chloride Stopped (06/27/21 2000)   albumin human Stopped (06/30/21 1322)   cefTRIAXone (ROCEPHIN)  IV 2 g (07/01/21 0851)   octreotide  (SANDOSTATIN)    IV infusion 50 mcg/hr (07/01/21 0041)   PRN Meds:.acetaminophen,  HYDROcodone-acetaminophen, polyethylene glycol, sodium chloride   Assessment: Principal Problem:   Hypotension  Heather Peterson is a 81 y.o. 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  Decompensated liver cirrhosis with SBP and associated hepatic encephalopathy. .Ascitic fluid tapshowed a total cell count of 1600 with a neutrophil count of 65% which meets criteria for SBP. Culture so far negative. Denies any hematemesis or melena.  She has  had a fibula fracture questionable blood loss.  She is on oxygen.No evidence of GI bleed. Hb stable.  Family has been discussed with about high mortality associated with this condition.  Renal function is improving     Plan 1.  Continue antibiotics for SBP for 7 days and assess clinically if improving can stop if not we will need repeat diagnostic paracentesis. At this time clinically she is improving 2.   Daily 25 g of albumin due to AKI 3.  Continue daily octreotide and midodrine due to renal failure 4.  Follow-up ascites fluid cultures and change antibiotics accordingly if needed 5.   After his acute episode will require lifelong prophylaxis for SBP 6. Still has ascites - can drain PRN with albumin administered 25 grams of 25 % every time fluid taken out.  7. Consider PRN lactulose titrated to 2 soft bowel movements per day .        LOS: 4 days   Heather Bellows, MD 07/01/2021, 9:33 AM

## 2021-07-01 NOTE — Progress Notes (Signed)
Speech Language Pathology Treatment:    Patient Details Name: Heather Peterson MRN: [id number] DOB: 1941-01-24 Today's Date: 07/01/2021 Time: 0950-1000 SLP Time Calculation (min) (ACUTE ONLY): 10 min  Assessment / Plan / Recommendation Clinical Impression  Pt seen for diet tolerance. Pt with waxing/waning LOA. Some confusion noted. Pt feeding self breakfast upon SLP entrance to room.   Pt observed with consistencies from mech soft breakfast tray including scrambled eggs, potatoes, milk, and water. Pt with mildly prolonged mastication and trace lingual residual which pt utilized liquid wash independently to clear. No overt or subtle s/sx pharyngeal dysphagia noted.   Per chart review, temp and WBC WNL. Pt on 3L/min O2 via [place]. No recent chest imaging.   Recommend continuation of mech soft diet with thin liquids (extra gravies, sauces, condiments to moisten dry solids) with safe swallowing strategies as outlined below. Mech soft diet recommended at this time due to mental status and overall deconditioned state during time of acute illness.   Will f/u x1 for clinical swallowing re-evaluation/trials of upgraded textures mid to end of the week (~2/23). If pt's discharges before SLP f/u recommend post-acute SLP services for dysphagia management.   Pt and RN made aware of SLP recommendations and SLP POC. Pt nodding in agreement.     HPI HPI: Per 46 H&P "This is a 81 yo female who presented to [facility] ER via EMS on 02/15 with c/o abdominal distension and recurrents falls. Per EMS upon their arrival at pts home pt hypotensive bp 84/48, therefore 500 ml iv fluid bolus administered.  She was also placed on 3L O2 via nasal canula (wears prn home O2).       She previously presented to the ER on 02/14 via EMS with c/o fluid retention in her abdomen and weeping of bilateral lower extremities.  She reported requiring paracentesis x2 over the past month due to recurrent ascites.  She also reported she had  been previously scheduled for an outpatient paracentesis on 02/16. She also endorsed a headache, decreased oral intake (onset 02/12), nausea/vomiting, and some shortness of breath.  At that time bp borderline 81/48.  CXR, EKG, CT Head/Cervical Spine were negative.  Lab results: T4, free (Direct) 0.42/hgb 7.4, and ammonia 18.  Pt deemed stable and did not require hospital admission." CXR 06/27/21 "1. Cardiac enlargement and mild pulmonary edema.  2. Low lung volumes with right lung base atelectasis."      SLP Plan  Continue with current plan of care      Recommendations for follow up therapy are one component of a multi-disciplinary discharge planning process, led by the attending physician.  Recommendations may be updated based on patient status, additional functional criteria and insurance authorization.    Recommendations  Diet recommendations: Dysphagia 3 (mechanical soft);Thin liquid Medication Administration: Whole meds with liquid (vs whole with puree, as tolerated) Supervision: Patient able to self feed (meal set up) Compensations: Minimize environmental distractions;Slow rate;Small sips/bites;Follow solids with liquid Postural Changes and/or Swallow Maneuvers: Out of bed for meals;Seated upright 90 degrees;Upright 30-60 min after meal                Oral Care Recommendations: Oral care QID;Oral care before and after PO Follow Up Recommendations: Skilled nursing-short term rehab (<3 hours/day) Assistance recommended at discharge: Frequent or constant Supervision/Assistance SLP Visit Diagnosis: Dysphagia, oral phase (R13.11) Plan: Continue with current plan of care          Cherrie Gauze, M.S., [facility] [phone] ([place])   Quintella Baton  07/01/2021, 11:04 AM

## 2021-07-01 NOTE — TOC Progression Note (Signed)
Transition of Care [facility]) - Progression Note    Patient Details  Name: Heather Peterson MRN: [id number] Date of Birth: 1941-02-02  Transition of Care [facility]) CM/SW [place], LCSW Phone Number: 07/01/2021, 8:54 AM  Clinical Narrative:   CSW asked Tammy at [facility] to review referral sent by [facility] on Friday.     Expected Discharge Plan: [place] Barriers to Discharge: Continued Medical Work up  Expected Discharge Plan and Services Expected Discharge Plan: [place]   Discharge Planning Services: CM Consult Post Acute Care Choice: [place] Living arrangements for the past 2 months: Single Family Home                 DME Arranged: N/A DME Agency: NA       HH Arranged: NA HH Agency: NA         Social Determinants of Health (SDOH) Interventions    Readmission Risk Interventions Readmission Risk Prevention Plan 06/28/2021 02/20/2021 02/16/2021  Transportation Screening Complete Complete Complete  Medication Review Press photographer) Complete Complete Complete  PCP or Specialist appointment within 3-5 days of discharge Complete - Complete  HRI or Home Care Consult Complete Complete Complete  SW Recovery Care/Counseling Consult Complete Complete Complete  Palliative Care Screening Complete Not Applicable Not [place] Patient Refused Patient Refused Complete  Some recent data might be hidden

## 2021-07-01 NOTE — Progress Notes (Signed)
[place] for Electrolyte Monitoring and Replacement   Recent Labs: Potassium (mmol/L)  Date Value  06/30/2021 3.7   Magnesium (mg/dL)  Date Value  06/30/2021 2.5 (H)   Calcium (mg/dL)  Date Value  06/30/2021 8.2 (L)   Calcium, Total (PTH) (mg/dL)  Date Value  09/07/2019 8.6 (L)   Albumin (g/dL)  Date Value  06/27/2021 2.9 (L)   Phosphorus (mg/dL)  Date Value  06/28/2021 6.5 (H)   Sodium (mmol/L)  Date Value  06/30/2021 138  03/31/2017 140    Assessment: 81 y.o. female with past medical history of CAD, CKD, AICD, cirrhosis, requiring recurrent paracentesis, multiple myeloma, here with fall. Pharmacy is asked to follow and replace electrolytes while in the  CCU.  Give albumin for SBP and hepatorenal   Goal of Therapy:  Electrolytes within normal limits  Plan:  No electrolyte replacement warranted for today Patient care transferred from PCCM to [facility]. Will discontinue electrolyte consult at this time. Defer further ordering of labs and electrolyte replacement to primary team  Benita Gutter 07/01/2021 7:58 AM

## 2021-07-02 LAB — BASIC METABOLIC PANEL
Anion gap: 11 (ref 5–15)
BUN: 52 mg/dL — ABNORMAL HIGH (ref 8–23)
CO2: 22 mmol/L (ref 22–32)
Calcium: 8.5 mg/dL — ABNORMAL LOW (ref 8.9–10.3)
Chloride: 105 mmol/L (ref 98–111)
Creatinine, Ser: 1.96 mg/dL — ABNORMAL HIGH (ref 0.44–1.00)
GFR, Estimated: 26 mL/min — ABNORMAL LOW (ref >60)
Glucose, Bld: 158 mg/dL — ABNORMAL HIGH (ref 70–99)
Potassium: 3.7 mmol/L (ref 3.5–5.1)
Sodium: 138 mmol/L (ref 135–145)

## 2021-07-02 LAB — CBC
HCT: 29.7 % — ABNORMAL LOW (ref 36.0–46.0)
Hemoglobin: 9 g/dL — ABNORMAL LOW (ref 12.0–15.0)
MCH: 28.2 pg (ref 26.0–34.0)
MCHC: 30.3 g/dL (ref 30.0–36.0)
MCV: 93.1 fL (ref 80.0–100.0)
Platelets: 157 10*3/uL (ref 150–400)
RBC: 3.19 MIL/uL — ABNORMAL LOW (ref 3.87–5.11)
RDW: 20.8 % — ABNORMAL HIGH (ref 11.5–15.5)
WBC: 7.4 10*3/uL (ref 4.0–10.5)
nRBC: 0.8 % — ABNORMAL HIGH (ref 0.0–0.2)

## 2021-07-02 LAB — CULTURE, BLOOD (SINGLE): Culture: NO GROWTH

## 2021-07-02 LAB — MAGNESIUM: Magnesium: 2.5 mg/dL — ABNORMAL HIGH (ref 1.7–2.4)

## 2021-07-02 MED ORDER — DIPHENHYDRAMINE HCL 50 MG/ML IJ SOLN
12.5000 mg | Freq: Once | INTRAMUSCULAR | Status: AC
  Administered 2021-07-02: 12.5 mg via INTRAVENOUS
  Filled 2021-07-02: qty 1

## 2021-07-02 MED ORDER — MORPHINE SULFATE (PF) 2 MG/ML IV SOLN
1.0000 mg | Freq: Once | INTRAVENOUS | Status: AC
  Administered 2021-07-02: 22:00:00 1 mg via INTRAVENOUS
  Filled 2021-07-02: qty 1

## 2021-07-02 NOTE — Care Management Important Message (Signed)
Important Message  Patient Details  Name: Heather Peterson MRN: [id number] Date of Birth: Sep 18, 1940   Medicare Important Message Given:  Yes     [name] [name] 07/02/2021, 3:24 PM

## 2021-07-02 NOTE — TOC Progression Note (Addendum)
Transition of Care [facility]) - Progression Note    Patient Details  Name: Heather Peterson MRN: [id number] Date of Birth: 07/24/1940  [facility]) CM/SW [place], RN Phone Number: 07/02/2021, 2:07 PM  Clinical Narrative:   Patient and family chose [facility].  [facility] notified and sent recent therapy notes.  Patient can transfer when medically appropriate.  TOC to follow.  Patient stated she has chest pain, charge nurse aware.  Expected Discharge Plan: [place] Barriers to Discharge: Continued Medical Work up  Expected Discharge Plan and Services Expected Discharge Plan: [place]   Discharge Planning Services: CM Consult Post Acute Care Choice: [place] Living arrangements for the past 2 months: Single Family Home                 DME Arranged: N/A DME Agency: NA       HH Arranged: NA HH Agency: NA         Social Determinants of Health (SDOH) Interventions    Readmission Risk Interventions Readmission Risk Prevention Plan 06/28/2021 02/20/2021 02/16/2021  Transportation Screening Complete Complete Complete  Medication Review Press photographer) Complete Complete Complete  PCP or Specialist appointment within 3-5 days of discharge Complete - Complete  HRI or Home Care Consult Complete Complete Complete  SW Recovery Care/Counseling Consult Complete Complete Complete  Palliative Care Screening Complete Not Applicable Not [place] Patient Refused Patient Refused Complete  Some recent data might be hidden

## 2021-07-02 NOTE — Progress Notes (Signed)
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 Per EMS upon their arrival at pts home pt hypotensive bp 84/48, therefore 500 ml iv fluid bolus administered.  She was also placed on 3L O2 via nasal canula (wears prn home O2).    In the ED, pt was found hemoccult positive, therefore received 1 unit of pRBC's.  GI consulted Dr. Vicente Males recommended starting empiric abx, octreotide, and protonix.  CT Head negative for acute abnormality.  CT Cervical Spine revealed no evidence of acute fracture or traumatic malalignment, however concerned for emphysema and possible mild interstitial edema vs. small layering right pleural effusion.  Left tibia/fibula xray revealed acute fracture of the proximal metadiaphysis of the fibula.  Orthopedic surgeon Dr. Roland Rack consulted per ED MD with recommendation to place a knee immobilizer for now. PCCM team contacted for ICU admission due to possible need of vasopressor therapy due to hypotension.  Pt was on Levo for just 1 day and was weaned off and transferred to [facility] on 2/17.  Hypotension, chronic, secondary to  Liver cirrhosis, chronic  Possible acute on chronic anemia , hypovolemic --Outpatient records appear SBP high 80s-low 100s. --briefly on Levo for a day.   Plan: --cont home midodrine 10 mg TID  Sepsis, ruled out --did not meet criteria  SBP --Ascitic fluid tap 3.2L showed a total  cell count of 1600 with a neutrophil count of 65% which meets criteria for SBP. Culture so far negative.  --GI consulted Plan: --cont ceftriaxone, day 6 of 7 --IV albumin and octreotide, per GI --will require lifelong prophylaxis for SBP, per GI  Recurrent ascites --consider paracentesis prior to discharge   Decompensated liver cirrhosis  --GI consulted, discussed with family the high mortality   Acute toxic encephalopathy 2/2 infection --treat SBP  Acute closed nondisplaced left proximal fibular fracture --ortho consulted, Dr. Roland Rack --knee immobilizer applied --weight-bear as tolerated on the left leg --follow-up in ortho office in 5 to 6 weeks for re-evaluation. 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  Cr ~2.2 in the past month.   Acute on chronic anemia with hx of angiodysplastic lesions  - Hgb 8-9's - GI consulted appreciate input~continue octreotide/protonix gtts and once stable will proceed with EGD  --monitor Hgb and transfuse to keep Hgb >8   Hypothyroidism  - cont home Synthroid   Multiple myeloma  - Oncology consulted, Dr. Grayland Ormond --Patient has not received treatment since February 09, 2021.  No further treatments are planned at this time, per onc. --Recommend palliative  care consult.   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  Still sleeping a lot.   Objective: Vitals:   07/02/21 0751 07/02/21 1100 07/02/21 1119 07/02/21 1536  BP: 113/73 125/80 (!) 121/58 98/70  Pulse: 93 83 78 81  Resp: _0 Temp: 97.7 F (36.5 C) 97.9 F (36.6 C) 97.9 F (36.6 C) 97.6 F (36.4 C)  TempSrc: Oral Oral Oral Oral  SpO2: 99% 96% 96% 99%  Weight:      Height:        Intake/Output Summary (Last 24 hours) at 07/02/2021 1901 Last data filed at 07/02/2021 1422 Gross per 24 hour  Intake 360 ml  Output --  Net 360 ml   Filed Weights   06/30/21 0600 07/01/21 0500 07/02/21 0605  Weight: 79 kg 82.6 kg 80.4 kg    Examination:   Constitutional: NAD, sleepy but arousable, oriented to self and place HEENT: conjunctivae and lids normal, EOMI CV: No cyanosis.   RESP: normal respiratory effort, on 2L Extremities: left knee in brace SKIN: warm, dry   Data Reviewed: I have personally reviewed following labs and imaging studies  CBC: Recent Labs  Lab 06/26/21 1813 06/27/21 0634 06/27/21 1336 06/28/21 0453 06/29/21 0354 06/30/21 0414 07/01/21 0839 07/02/21 0621  WBC 11.0* 8.9  --  8.1 5.6 5.0 8.4 7.4  NEUTROABS 8.9* 6.4  --   --   --   --   --   --   HGB 7.4* 6.7*   < > 9.2* 8.9* 8.8* 9.1* 9.0*  HCT 25.4* 22.9*   < > 29.4* 28.2* 28.9* 29.8* 29.7*  MCV 94.8 95.0  --  89.6 91.0 92.0 92.5 93.1  PLT 231 211  --  174 149* 159 167 157   < > = values in this interval not displayed.   Basic Metabolic Panel: Recent Labs  Lab 06/27/21 1336 06/28/21 0453 06/29/21 0354 06/30/21 0414 07/01/21 0839 07/02/21 0621  NA  --  138 138 138 137 138  K  --  4.1 3.7 3.7 3.7 3.7   CL  --  104 105 105 102 105  CO2  --  _1 21* 22  GLUCOSE  --  126* 121* 121* 124* 158*  BUN  --  51* 50* 53* 53* 52*  CREATININE  --  2.60* 2.50* 2.33* 2.31* 1.96*  CALCIUM  --  8.2* 8.2* 8.2* 8.3* 8.5*  MG 2.3 2.4 2.5* 2.5* 2.6* 2.5*  PHOS 6.6* 6.5*  --   --   --   --    GFR: Estimated Creatinine Clearance: 23.4 mL/min (A) (by C-G formula based on SCr of 1.96 mg/dL (H)). Liver Function Tests: Recent Labs  Lab 06/26/21 1813 06/27/21 0634  AST 18 17  ALT 9 8  ALKPHOS 114 104  BILITOT 0.6 0.7  PROT 6.5 6.0*  ALBUMIN 3.3* 2.9*   Recent Labs  Lab 06/27/21 0634  LIPASE 49  Recent Labs  Lab 06/26/21 2108  AMMONIA 18   Coagulation Profile: Recent Labs  Lab 06/27/21 1336  INR 1.5*   Cardiac Enzymes: No results for input(s): CKTOTAL, CKMB, CKMBINDEX, TROPONINI in the last 168 hours. BNP (last 3 results) No results for input(s): PROBNP in the last 8760 hours. HbA1C: No results for input(s): HGBA1C in the last 72 hours. CBG: Recent Labs  Lab 06/27/21 1127  GLUCAP 87   Lipid Profile: No results for input(s): CHOL, HDL, LDLCALC, TRIG, CHOLHDL, LDLDIRECT in the last 72 hours. Thyroid Function Tests: No results for input(s): TSH, T4TOTAL, FREET4, T3FREE, THYROIDAB in the last 72 hours.  Anemia Panel: No results for input(s): VITAMINB12, FOLATE, FERRITIN, TIBC, IRON, RETICCTPCT in the last 72 hours.  Sepsis Labs: Recent Labs  Lab 06/26/21 1813 06/27/21 0634 06/27/21 0755 06/27/21 1008  PROCALCITON  --   --  0.32  --   LATICACIDVEN 1.1 1.2  --  1.2    Recent Results (from the past 240 hour(s))  Resp Panel by RT-PCR (Flu A&B, Covid) Nasopharyngeal Swab     Status: None   Collection Time: 06/27/21  7:55 AM   Specimen: Nasopharyngeal Swab; Nasopharyngeal(NP) swabs in vial transport medium  Result Value Ref Range Status   SARS Coronavirus 2 by RT PCR NEGATIVE NEGATIVE Final    Comment: (NOTE) SARS-CoV-2 target nucleic acids are NOT DETECTED.  The  SARS-CoV-2 RNA is generally detectable in upper respiratory specimens during the acute phase of infection. The lowest concentration of SARS-CoV-2 viral copies this assay can detect is 138 copies/mL. A negative result does not preclude SARS-Cov-2 infection and should not be used as the sole basis for treatment or other patient management decisions. A negative result may occur with  improper specimen collection/handling, submission of specimen other than nasopharyngeal swab, presence of viral mutation(s) within the areas targeted by this assay, and inadequate number of viral copies(<138 copies/mL). A negative result must be combined with clinical observations, patient history, and epidemiological information. The expected result is Negative.  Fact Sheet for Patients:  EntrepreneurPulse.com.au  Fact Sheet for Healthcare Providers:  IncredibleEmployment.be  This test is no t yet approved or cleared by the [place] FDA and  has been authorized for detection and/or diagnosis of SARS-CoV-2 by FDA under an Emergency Use Authorization (EUA). This EUA will remain  in effect (meaning this test can be used) for the duration of the COVID-19 declaration under Section 564(b)(1) of the Act, 21 U.S.C.section 360bbb-3(b)(1), unless the authorization is terminated  or revoked sooner.       Influenza A by PCR NEGATIVE NEGATIVE Final   Influenza B by PCR NEGATIVE NEGATIVE Final    Comment: (NOTE) The Xpert Xpress SARS-CoV-2/FLU/RSV plus assay is intended as an aid in the diagnosis of influenza from Nasopharyngeal swab specimens and should not be used as a sole basis for treatment. Nasal washings and aspirates are unacceptable for Xpert Xpress SARS-CoV-2/FLU/RSV testing.  Fact Sheet for Patients: EntrepreneurPulse.com.au  Fact Sheet for Healthcare Providers: IncredibleEmployment.be  This test is not yet approved or  cleared by the [place] FDA and has been authorized for detection and/or diagnosis of SARS-CoV-2 by FDA under an Emergency Use Authorization (EUA). This EUA will remain in effect (meaning this test can be used) for the duration of the COVID-19 declaration under Section 564(b)(1) of the Act, 21 U.S.C. section 360bbb-3(b)(1), unless the authorization is terminated or revoked.  Performed at [facility], [address] [place], [place] [address]  Blood culture (single)     Status: None   Collection Time: 06/27/21  7:55 AM   Specimen: BLOOD  Result Value Ref Range Status   Specimen Description BLOOD BLOOD RIGHT FOREARM  Final   Special Requests   Final    BOTTLES DRAWN AEROBIC AND ANAEROBIC Blood Culture results may not be optimal due to an excessive volume of blood received in culture bottles   Culture   Final    NO GROWTH 5 DAYS Performed at [facility], [address] [place], [place] [address]    Report Status 07/02/2021 FINAL  Final  Blood culture (single)     Status: Abnormal   Collection Time: 06/27/21 10:08 AM   Specimen: BLOOD LEFT HAND  Result Value Ref Range Status   Specimen Description   Final    BLOOD LEFT HAND Performed at [facility], [address], [place], [place] [address]    Special Requests   Final    BOTTLES DRAWN AEROBIC AND ANAEROBIC Blood Culture adequate volume Performed at [facility], [address] [place], [place] [address]    Culture  Setup Time   Final    Organism ID to follow [place] CRITICAL RESULT CALLED TO, READ BACK BY AND VERIFIED WITH: KRISTIN MILLER 06/28/21 0741 BY [email or web address] Performed at [facility], [address] [place], [place] [address]    Culture STREPTOCOCCUS PARASANGUINIS (A)  Final   Report Status 07/01/2021 FINAL  Final   Organism ID, Bacteria STREPTOCOCCUS PARASANGUINIS  Final      Susceptibility   Streptococcus parasanguinis -  MIC*    PENICILLIN 0.25 INTERMEDIATE Intermediate     CEFTRIAXONE 0.5 SENSITIVE Sensitive     LEVOFLOXACIN >=16 RESISTANT Resistant     VANCOMYCIN 0.5 SENSITIVE Sensitive     * STREPTOCOCCUS PARASANGUINIS  Blood Culture ID Panel (Reflexed)     Status: Abnormal   Collection Time: 06/27/21 10:08 AM  Result Value Ref Range Status   Enterococcus faecalis NOT DETECTED NOT DETECTED Final   Enterococcus Faecium NOT DETECTED NOT DETECTED Final   Listeria monocytogenes NOT DETECTED NOT DETECTED Final   Staphylococcus species NOT DETECTED NOT DETECTED Final   Staphylococcus aureus (BCID) NOT DETECTED NOT DETECTED Final   Staphylococcus epidermidis NOT DETECTED NOT DETECTED Final   Staphylococcus lugdunensis NOT DETECTED NOT DETECTED Final   Streptococcus species DETECTED (A) NOT DETECTED Final    Comment: Not Enterococcus species, Streptococcus agalactiae, Streptococcus pyogenes, or Streptococcus pneumoniae. CRITICAL RESULT CALLED TO, READ BACK BY AND VERIFIED WITH: KRISTIN MILLER 06/28/21 0741 MW    Streptococcus agalactiae NOT DETECTED NOT DETECTED Final   Streptococcus pneumoniae NOT DETECTED NOT DETECTED Final   Streptococcus pyogenes NOT DETECTED NOT DETECTED Final   A.calcoaceticus-baumannii NOT DETECTED NOT DETECTED Final   Bacteroides fragilis NOT DETECTED NOT DETECTED Final   Enterobacterales NOT DETECTED NOT DETECTED Final   Enterobacter cloacae complex NOT DETECTED NOT DETECTED Final   Escherichia coli NOT DETECTED NOT DETECTED Final   Klebsiella aerogenes NOT DETECTED NOT DETECTED Final   Klebsiella oxytoca NOT DETECTED NOT DETECTED Final   Klebsiella pneumoniae NOT DETECTED NOT DETECTED Final   Proteus species NOT DETECTED NOT DETECTED Final   Salmonella species NOT DETECTED NOT DETECTED Final   Serratia marcescens NOT DETECTED NOT DETECTED Final   Haemophilus influenzae NOT DETECTED NOT DETECTED Final   Neisseria meningitidis NOT DETECTED NOT DETECTED Final   Pseudomonas  aeruginosa NOT DETECTED NOT DETECTED Final  Stenotrophomonas maltophilia NOT DETECTED NOT DETECTED Final   Candida albicans NOT DETECTED NOT DETECTED Final   Candida auris NOT DETECTED NOT DETECTED Final   Candida glabrata NOT DETECTED NOT DETECTED Final   Candida krusei NOT DETECTED NOT DETECTED Final   Candida parapsilosis NOT DETECTED NOT DETECTED Final   Candida tropicalis NOT DETECTED NOT DETECTED Final   Cryptococcus neoformans/gattii NOT DETECTED NOT DETECTED Final    Comment: Performed at [facility], [address] [place], [place] [address]  MRSA Next Gen by PCR, Nasal     Status: None   Collection Time: 06/27/21 11:30 AM   Specimen: Nasal Mucosa; Nasal Swab  Result Value Ref Range Status   MRSA by PCR Next Gen NOT DETECTED NOT DETECTED Final    Comment: (NOTE) The GeneXpert MRSA Assay (FDA approved for NASAL specimens only), is one component of a comprehensive MRSA colonization surveillance program. It is not intended to diagnose MRSA infection nor to guide or monitor treatment for MRSA infections. Test performance is not FDA approved in patients less than 46 years old. Performed at [facility], [address] [place], [place] [address]   Body fluid culture w Gram Stain     Status: None   Collection Time: 06/27/21  3:00 PM   Specimen: PATH Cytology Peritoneal fluid  Result Value Ref Range Status   Specimen Description   Final    PERITONEAL Performed at [facility], [address], [place], [place] [address]    Special Requests   Final    NONE Performed at [facility], [address] [place], [place] [address]    Gram Stain   Final    RARE WBC PRESENT,BOTH PMN AND MONONUCLEAR NO ORGANISMS SEEN    Culture   Final    NO GROWTH 3 DAYS Performed at [facility], [place] [address], [place], [place] [address]    Report Status 07/01/2021 FINAL  Final      Radiology Studies: No results found.   Scheduled Meds:   Chlorhexidine Gluconate Cloth  6 each Topical Daily   FLUoxetine  10 mg Oral Daily   levothyroxine  50 mcg Oral Q48H   levothyroxine  75 mcg Oral Q48H   lidocaine  1 patch Transdermal q1600   loratadine  10 mg Oral Daily   mouth rinse  15 mL Mouth Rinse BID   midodrine  10 mg Oral TID   simvastatin  20 mg Oral QHS   Continuous Infusions:  sodium chloride Stopped (06/27/21 0819)   sodium chloride Stopped (06/27/21 2000)   albumin human 25 g (07/02/21 1131)   cefTRIAXone (ROCEPHIN)  IV 2 g (07/02/21 1049)   octreotide  (SANDOSTATIN)    IV infusion 50 mcg/hr (07/02/21 0520)     LOS: 5 days     Enzo Bi, MD [facility] If 7PM-7AM, please contact night-coverage 07/02/2021, 7:01 PM

## 2021-07-02 NOTE — Progress Notes (Signed)
Speech Language Pathology Treatment:    Patient Details Name: Heather Peterson MRN: [id number] DOB: [birth year] Today's Date: 07/02/2021 Time: 1230-1300 SLP Time Calculation (min) (ACUTE ONLY): 30 min  Assessment / Plan / Recommendation Clinical Impression  Pt seen for diet tolerance and trials of upgraded textures. Pt alert, pleasant, and cooperative. Endorse feeling sleepy. Delayed verbal responses noted at times. Upon SLP entrance to room pt transferring from [facility] to recliner for lunch.   Pt observed with consistencies from mech soft lunch tray including vegetable soup, mac and cheese, milk, and iced tea. Liquids consumed via straw due to pt preference. Pt also given trials of regular solids. Pt with functional mastication and trace lingual residual with soft and regular solids which pt utilized liquid wash independently to clear. Pt also with independent use of safe swallowing strategies including slow rate of intake and small bites/sips. No overt or subtle s/sx pharyngeal dysphagia noted. Pt able to feed self with set up.    Per chart review, temp and WBC WNL. On 2L/min O2 via [place] which is pt's baseline O2 requirement per pt report. No recent chest imaging.  Recommend diet upgrade to a regular diet with thin liquid and safe swallowing strategies/aspiration precautions as outlined below.   SLP to sign off as pt is likely at swallowing baseline.  Pt and RN made aware of diet recommendations, safe swallowing strategies/aspiration precautions, and SLP POC. Pt verbalized understanding/agreement.    HPI HPI: Per 73 H&P "This is a 81 yo female who presented to [facility] ER via EMS on 02/15 with c/o abdominal distension and recurrents falls. Per EMS upon their arrival at pts home pt hypotensive bp 84/48, therefore 500 ml iv fluid bolus administered.  She was also placed on 3L O2 via nasal canula (wears prn home O2).       She previously presented to the ER on 02/14 via EMS with c/o fluid  retention in her abdomen and weeping of bilateral lower extremities.  She reported requiring paracentesis x2 over the past month due to recurrent ascites.  She also reported she had been previously scheduled for an outpatient paracentesis on 02/16. She also endorsed a headache, decreased oral intake (onset 02/12), nausea/vomiting, and some shortness of breath.  At that time bp borderline 81/48.  CXR, EKG, CT Head/Cervical Spine were negative.  Lab results: T4, free (Direct) 0.42/hgb 7.4, and ammonia 18.  Pt deemed stable and did not require hospital admission." CXR 06/27/21 "1. Cardiac enlargement and mild pulmonary edema.  2. Low lung volumes with right lung base atelectasis."      SLP Plan  All goals met      Recommendations for follow up therapy are one component of a multi-disciplinary discharge planning process, led by the attending physician.  Recommendations may be updated based on patient status, additional functional criteria and insurance authorization.    Recommendations  Diet recommendations: Regular;Thin liquid Medication Administration: Whole meds with liquid Supervision: Patient able to self feed (meal set up) Compensations: Minimize environmental distractions;Slow rate;Small sips/bites;Follow solids with liquid Postural Changes and/or Swallow Maneuvers: Out of bed for meals;Seated upright 90 degrees;Upright 30-60 min after meal                Oral Care Recommendations: Oral care QID;Oral care before and after PO Follow Up Recommendations: Skilled nursing-short term rehab (<3 hours/day) Assistance recommended at discharge: Frequent or constant Supervision/Assistance SLP Visit Diagnosis: Dysphagia, oral phase (R13.11) Plan: All goals met  Cherrie Gauze, M.S., [facility] [phone] [place])   Quintella Baton  07/02/2021, 2:03 PM

## 2021-07-02 NOTE — Progress Notes (Signed)
Physical Therapy Treatment Patient Details Name: Heather Peterson MRN: [id number] DOB: 12-12-1940 Today's Date: 07/02/2021   History of Present Illness Heather Peterson is a 81 y.o. 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  Decompensated liver cirrhosis with possible SBP and associated hepatic encephalopathy.  She also has bacteremia with positive blood cultures.Ascitic fluid tap yesterday showed a total cell count of 1600 with a neutrophil count of 65% which meets criteria for SBP. Culture so far negative.   Denies any hematemesis or melena.  She has had a closed non displaced  fibula fracture questionable blood loss.  When I went in to see her she was quite confused and I am concerned she has overt encephalopathy secondary to the infection.  Her blood pressure still in the hypotensive range.  She is on oxygen.No evidence of GI bleed. Hb stable    PT Comments    Pt is making good progress towards goals with ability to step pivot transfer to recliner. O2 briefly removed for transfer to recliner with quick de-sat to 83% and recovery once 2L reapplied. RW used with good weight acceptance on L LE. KI donned. Pt pleased with ability to sit in recliner. Will continue to progress.  Recommendations for follow up therapy are one component of a multi-disciplinary discharge planning process, led by the attending physician.  Recommendations may be updated based on patient status, additional functional criteria and insurance authorization.  Follow Up Recommendations  Skilled nursing-short term rehab (<3 hours/day)     Assistance Recommended at Discharge Intermittent Supervision/Assistance  Patient can return home  with the following A lot of help with walking and/or transfers;A lot of help with bathing/dressing/bathroom;Help with stairs or ramp for entrance   Equipment Recommendations  None recommended by PT    Recommendations for Other Services       Precautions / Restrictions Precautions Precautions: Fall Knee Immobilizer - Left: On when out of bed or walking;On except when in CPM Restrictions Weight Bearing Restrictions: Yes LLE Weight Bearing: Weight bearing as tolerated     Mobility  Bed Mobility Overal bed mobility: Needs Assistance Bed Mobility: Supine to Sit     Supine to sit: Mod assist, HOB elevated     General bed mobility comments: safe technique with ability to decrease physical assist once seated at EOB, only requiring cga. All mobility performed on 2L of O2 with sats at 97%.    Transfers Overall transfer level: Needs assistance Equipment used: Rolling walker (2 wheels) Transfers: Sit to/from Stand, Bed to chair/wheelchair/BSC Sit to Stand: Mod assist   Step pivot transfers: Mod assist       General transfer comment: follows commands well. Tolerates WBing through L LE with minimal pain. Able to step transfer over to recliner.    Ambulation/Gait Ambulation/Gait assistance: Mod assist             General Gait Details: able to step transfer over to recliner. Further mobility deferred due to low O2 sats at 83%. Cues for pursed lip breathing for improved sats   Stairs             Wheelchair Mobility    Modified Rankin (Stroke Patients Only)       Balance Overall balance  assessment: Needs assistance   Sitting balance-Leahy Scale: Good     Standing balance support: Bilateral upper extremity supported Standing balance-Leahy Scale: Fair                              Cognition Arousal/Alertness: Awake/alert Behavior During Therapy: WFL for tasks assessed/performed Overall Cognitive Status: Within Functional Limits for tasks  assessed                                 General Comments: generally drowsy throughout session, however does follow commands well and participates with PT. Minimal complaints of pain        Exercises Other Exercises Other Exercises: supine/seated ther-ex performed on B LE including AP, quad sets, SLRs, hip abd/add, and LAQ. 10 reps with min assist Other Exercises: noted bed to be soiled during transfer to recliner. Needs total assist for self hygiene, secure chat RN to change linens once in recliner    General Comments        Pertinent Vitals/Pain Pain Assessment Pain Assessment: 0-10 Pain Score: 3  Pain Location: L LE Pain Descriptors / Indicators: Sore Pain Intervention(s): Limited activity within patient's tolerance, Repositioned, Premedicated before session    Home Living                          Prior Function            PT Goals (current goals can now be found in the care plan section) Acute Rehab PT Goals Patient Stated Goal: Start moving better PT Goal Formulation: With patient Time For Goal Achievement: 07/13/21 Potential to Achieve Goals: Good Progress towards PT goals: Progressing toward goals    Frequency    Min 2X/week      PT Plan Current plan remains appropriate    Co-evaluation              AM-PAC PT "6 Clicks" Mobility   Outcome Measure  Help needed turning from your back to your side while in a flat bed without using bedrails?: A Little Help needed moving from lying on your back to sitting on the side of a flat bed without using bedrails?: A Lot Help needed moving to and from a bed to a chair (including a wheelchair)?: A Lot Help needed standing up from a chair using your arms (e.g., wheelchair or bedside chair)?: A Lot Help needed to walk in hospital room?: Total Help needed climbing 3-5 steps with a railing? : Total 6 Click Score: 11    End of Session Equipment Utilized During Treatment: Gait belt;Left  knee immobilizer;Oxygen Activity Tolerance: Patient tolerated treatment well Patient left: in chair;with chair alarm set Nurse Communication: Mobility status PT Visit Diagnosis: Difficulty in walking, not elsewhere classified (R26.2);Muscle weakness (generalized) (M62.81);Repeated falls (R29.6);History of falling (Z91.81)     Time: 0354-6568 PT Time Calculation (min) (ACUTE ONLY): 26 min  Charges:  $Gait Training: 8-22 mins $Therapeutic Exercise: 8-22 mins                     Greggory Stallion, PT, DPT, GCS [phone]    Heather Peterson 07/02/2021, 10:53 AM

## 2021-07-02 NOTE — Progress Notes (Signed)
Occupational Therapy Treatment Patient Details Name: Heather Peterson MRN: [id number] DOB: 12/30/1940 Today's Date: 07/02/2021   History of present illness Heather Peterson is a 81 y.o. 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  Decompensated liver cirrhosis with possible SBP and associated hepatic encephalopathy.  She also has bacteremia with positive blood cultures.Ascitic fluid tap yesterday showed a total cell count of 1600 with a neutrophil count of 65% which meets criteria for SBP. Culture so far negative.   Denies any hematemesis or melena.  She has had a closed non displaced  fibula fracture questionable blood loss.  When I went in to see her she was quite confused and I am concerned she has overt encephalopathy secondary to the infection.  Her blood pressure still in the hypotensive range.  She is on oxygen.No evidence of GI bleed. Hb stable   OT comments  Pt seen for OT treatment on this date. Upon arrival to room, pt asleep in bed however easily awoken and agreeable to OT tx. Pt remained drowsy throughout session, but reported no pain. Pt required MOD A for bed mobility. Once seated EOB, pt reported urge to have BM. Pt required only MIN A for sit>stand transfer from bed, MOD A for step pivot transfer to [facility], MOD A for peri-care, and MIN GUARD for standing hand hygiene. Following transfer to [facility], SpO2 95% while on 2L of O2. At end of session, pt left sitting upright in recliner, with SLP present and pt in no acute distress. Pt is making good progress toward goals and continues to benefit from skilled OT services to maximize return to PLOF and minimize risk of future falls, injury, caregiver burden, and  readmission. Will continue to follow POC. Discharge recommendation remains appropriate.     Recommendations for follow up therapy are one component of a multi-disciplinary discharge planning process, led by the attending physician.  Recommendations may be updated based on patient status, additional functional criteria and insurance authorization.    Follow Up Recommendations  Skilled nursing-short term rehab (<3 hours/day)    Assistance Recommended at Discharge Frequent or constant Supervision/Assistance  Patient can return home with the following  A lot of help with walking and/or transfers;A lot of help with bathing/dressing/bathroom;Direct supervision/assist for medications management;Direct supervision/assist for financial management;Assistance with cooking/housework;Assist for transportation;Help with stairs or ramp for entrance   Equipment Recommendations  BSC/3in1;Tub/shower seat;Other (comment) (2ww)       Precautions / Restrictions Precautions Precautions: Fall Required Braces or Orthoses: Knee Immobilizer - Left Knee Immobilizer - Left: On when out of bed or walking;On except when in CPM Restrictions Weight Bearing Restrictions: Yes LLE Weight Bearing: Weight bearing as tolerated       Mobility Bed Mobility Overal bed mobility: Needs Assistance Bed Mobility: Supine to Sit     Supine to sit: Mod assist     General bed mobility comments: MOD A for managing LE    Transfers Overall transfer level: Needs assistance Equipment used: Rolling walker (2 wheels) Transfers: Sit to/from Stand, Bed to chair/wheelchair/BSC Sit to Stand: Min assist     Step pivot transfers: Mod assist           Balance Overall balance assessment: Needs assistance   Sitting  balance-Leahy Scale: Fair °Sitting balance - Comments: Supervision while reaching within BOS °  °Standing balance support: No upper extremity supported, During functional activity °Standing balance-Leahy Scale:  Fair °Standing balance comment: Requires MIN GUARD for standing hand hygiene °  °  °  °  °  °  °  °  °  °  °  °   ° °ADL either performed or assessed with clinical judgement  ° °ADL Overall ADL's : Needs assistance/impaired °  °  °Grooming: Wash/dry hands;Min guard;Standing °Grooming Details (indicate cue type and reason): Requirs MIN GUARD for steadying when washing hands with washcloth °  °  °  °  °Upper Body Dressing : Minimal assistance;Sitting °Upper Body Dressing Details (indicate cue type and reason): to don/doff hospital gown °  °  °Toilet Transfer: Moderate assistance;Stand-pivot;BSC/3in1;Rolling walker (2 wheels) °  °Toileting- Clothing Manipulation and Hygiene: Moderate assistance;Sit to/from stand °Toileting - Clothing Manipulation Details (indicate cue type and reason): Requires MOD A for thoroughness °  °  °  °  °  ° ° ° °Cognition Arousal/Alertness: Lethargic, Awake/alert °Behavior During Therapy: WFL for tasks assessed/performed °Overall Cognitive Status: No family/caregiver present to determine baseline cognitive functioning °  °  °  °  °  °  °  °  °  °  °  °  °  °  °  °  °General Comments: generally drowsy throughout session, however follows 1-step directions with verbal and tactile cues °  °  °   °   °   °General Comments While on 2L of O2, SpO2 95% following bed>BSC transfer  ° ° °Pertinent Vitals/ Pain       Pain Assessment °Pain Assessment: No/denies pain ° °   °   ° °Frequency ° Min 2X/week  ° ° ° ° °  °Progress Toward Goals ° °OT Goals(current goals can now be found in the care plan section) ° Progress towards OT goals: Progressing toward goals ° °Acute Rehab OT Goals °Patient Stated Goal: to get up and move °OT Goal Formulation: With patient °Time For Goal Achievement: 07/13/21 °Potential to Achieve Goals: Good  °Plan Discharge plan remains appropriate;Frequency remains appropriate   ° °   °AM-PAC OT "6 Clicks" Daily Activity     °Outcome Measure ° ° Help from another person eating meals?:  None °Help from another person taking care of personal grooming?: A Little °Help from another person toileting, which includes using toliet, bedpan, or urinal?: A Lot °Help from another person bathing (including washing, rinsing, drying)?: A Lot °Help from another person to put on and taking off regular upper body clothing?: A Little °Help from another person to put on and taking off regular lower body clothing?: A Lot °6 Click Score: 16 ° °  °End of Session Equipment Utilized During Treatment: Rolling walker (2 wheels);Oxygen ° °OT Visit Diagnosis: Unsteadiness on feet (R26.81);Muscle weakness (generalized) (M62.81) °  °Activity Tolerance Patient tolerated treatment well °  °Patient Left in chair;with call bell/phone within reach;with bed alarm set;Other (comment) (with SLP) °  °Nurse Communication Mobility status °  ° °   ° °Time: 1157-1227 °OT Time Calculation (min): 30 min ° °Charges: OT General Charges °$OT Visit: 1 Visit °OT Treatments °$Self Care/Home Management : 23-37 mins ° °Lucy D Tamberrino, OTR/L °ASCOM [phone] ° °

## 2021-07-03 ENCOUNTER — Inpatient Hospital Stay: Payer: Medicare Other

## 2021-07-03 LAB — BODY FLUID CELL COUNT WITH DIFFERENTIAL
Eos, Fluid: 0 %
Lymphs, Fluid: 25 %
Monocyte-Macrophage-Serous Fluid: 61 %
Neutrophil Count, Fluid: 14 %
Total Nucleated Cell Count, Fluid: 324 cu mm

## 2021-07-03 LAB — PROTEIN, PLEURAL OR PERITONEAL FLUID: Total protein, fluid: 4.3 g/dL

## 2021-07-03 LAB — CBC
HCT: 29.8 % — ABNORMAL LOW (ref 36.0–46.0)
Hemoglobin: 9.2 g/dL — ABNORMAL LOW (ref 12.0–15.0)
MCH: 28.7 pg (ref 26.0–34.0)
MCHC: 30.9 g/dL (ref 30.0–36.0)
MCV: 92.8 fL (ref 80.0–100.0)
Platelets: 178 10*3/uL (ref 150–400)
RBC: 3.21 MIL/uL — ABNORMAL LOW (ref 3.87–5.11)
RDW: 21.3 % — ABNORMAL HIGH (ref 11.5–15.5)
WBC: 8.4 10*3/uL (ref 4.0–10.5)
nRBC: 1.8 % — ABNORMAL HIGH (ref 0.0–0.2)

## 2021-07-03 LAB — RESP PANEL BY RT-PCR (FLU A&B, COVID) ARPGX2
Influenza A by PCR: NEGATIVE
Influenza B by PCR: NEGATIVE
SARS Coronavirus 2 by RT PCR: NEGATIVE

## 2021-07-03 LAB — PATHOLOGIST SMEAR REVIEW

## 2021-07-03 LAB — BASIC METABOLIC PANEL
Anion gap: 14 (ref 5–15)
BUN: 55 mg/dL — ABNORMAL HIGH (ref 8–23)
CO2: 20 mmol/L — ABNORMAL LOW (ref 22–32)
Calcium: 8.7 mg/dL — ABNORMAL LOW (ref 8.9–10.3)
Chloride: 104 mmol/L (ref 98–111)
Creatinine, Ser: 1.91 mg/dL — ABNORMAL HIGH (ref 0.44–1.00)
GFR, Estimated: 26 mL/min — ABNORMAL LOW (ref >60)
Glucose, Bld: 128 mg/dL — ABNORMAL HIGH (ref 70–99)
Potassium: 4 mmol/L (ref 3.5–5.1)
Sodium: 138 mmol/L (ref 135–145)

## 2021-07-03 LAB — MAGNESIUM: Magnesium: 2.5 mg/dL — ABNORMAL HIGH (ref 1.7–2.4)

## 2021-07-03 MED ORDER — MORPHINE SULFATE 15 MG PO TABS: 15.0000 mg | ORAL_TABLET | Freq: Four times a day (QID) | ORAL | 0 refills | Status: AC | PRN

## 2021-07-03 MED ORDER — HYDROCODONE-ACETAMINOPHEN 5-325 MG PO TABS: 1.0000 | ORAL_TABLET | Freq: Four times a day (QID) | ORAL | 0 refills | Status: DC | PRN

## 2021-07-03 MED ORDER — DOXYCYCLINE HYCLATE 100 MG PO TABS
100.0000 mg | ORAL_TABLET | Freq: Every day | ORAL | Status: DC
  Administered 2021-07-03: 100 mg via ORAL
  Filled 2021-07-03: qty 1

## 2021-07-03 MED ORDER — ALBUMIN HUMAN 25 % IV SOLN: 25.0000 g | INTRAVENOUS | Status: AC | PRN

## 2021-07-03 MED ORDER — DOXYCYCLINE HYCLATE 100 MG PO TABS: 100.0000 mg | ORAL_TABLET | Freq: Every day | ORAL | Status: AC

## 2021-07-03 MED ORDER — MORPHINE SULFATE 15 MG PO TABS: 15.0000 mg | ORAL_TABLET | Freq: Four times a day (QID) | ORAL | Status: DC | PRN

## 2021-07-03 NOTE — Discharge Summary (Addendum)
Physician Discharge Summary   Heather Peterson  female DOB: 01/26/1941  YPP:[id number]  PCP: [name], MD  Admit date: 06/27/2021 Discharge date: 07/03/2021  Admitted From: home Disposition:  SNF CODE STATUS: DNR  Discharge Instructions     Diet - low sodium heart healthy   Complete by: As directed    No wound care   Complete by: As directed         Hospital Course:  For full details, please see H&P, progress notes, consult notes and ancillary notes.  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 Per EMS upon their arrival at pts home pt hypotensive bp 84/48, therefore 500 ml iv fluid bolus administered.  She was also placed on 3L O2 via nasal canula (wears prn home O2).     In the ED, pt was found hemoccult positive, therefore received 1 unit of pRBC's.  GI consulted Dr. Vicente Males recommended starting empiric abx, octreotide, and protonix.  CT Head negative for acute abnormality.  CT Cervical Spine revealed no evidence of acute fracture or traumatic malalignment.  Left tibia/fibula xray revealed acute fracture of the proximal metadiaphysis of the fibula.  Orthopedic surgeon Dr. Roland Rack consulted per ED MD with recommendation to place a knee immobilizer. PCCM team contacted for ICU admission due to possible need of vasopressor therapy due to hypotension.  Pt was on Levo for just 1 day and was weaned off and transferred to [facility] on 2/17.   Hypotension, chronic, secondary to  Liver cirrhosis, chronic  Possible acute on chronic anemia, hypovolemia  --Outpatient records  appear SBP high 80s-low 100s. --briefly on Levo for a day.   --cont home midodrine 10 mg TID   Sepsis, ruled out --did not meet criteria   SBP, POA --on presentation, Ascitic fluid tap 3.2L showed a total cell count of 1600 with a neutrophil count of 65% which meets criteria for SBP. Culture so far negative.  --GI consulted --Pt completed 7 days of ceftriaxone --IV albumin and octreotide gtt given AKI. --pt will require lifelong prophylaxis for SBP, per GI, and was discharged on doxy 100 mg daily (pt is allergic to Cipro and Bactrim).   Recurrent ascites --Pt received 2nd paracentesis on the day of discharge.  Pt will follow up with outpatient GI (Dr. Jacqulyn Liner, [facility]) for outpatient paracentesis.   --pt will need IV albumin 25%, 25g with each paracentesis.     Decompensated liver cirrhosis  --GI consulted, discussed with family the high mortality  --follow up with outpatient GI (Dr. Jacqulyn Liner, [place] [facility])   Acute toxic encephalopathy 2/2 infection --treated SBP   Acute closed nondisplaced left proximal fibular fracture --ortho consulted, Dr. Roland Rack --knee immobilizer applied --weight-bear as tolerated on the left leg --follow-up in ortho office in 5 to 6 weeks for re-evaluation. --PT/OT rec SNF rehab --pt and family didn't want Norco (which pt received during hospitalization), so pain med changed to oral morphine at discharge.   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  Pt was not discharged on scheduled diuretics due to hypotension.   Acute on chronic hypoxic respiratory failure secondary to COPD  Small right pleural effusion along with atelectasis  - on 2L O2 chronically  --Continue supplemental O2 to keep sats between 88-92%   AKI vs progression of CKD Stage IV  --Cr 2.49 on presentation, peaked at  2.6, and improved to 1.91 prior to discharge.   Acute on chronic anemia with hx of angiodysplastic lesions  - Pt received 2u pRBC during hospitalization. Hgb stable around 9's prior to discharge. - GI consulted, recommended octreotide/protonix gtts.  Decision made not to perform endoscopic eval. --cont PPI   Hypothyroidism  - cont home Synthroid   Multiple myeloma  - Oncology consulted, Dr. Grayland Ormond --Patient has not received treatment since February 09, 2021.  No further treatments are planned at this time, per onc.   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   Discharge Diagnoses:  Principal Problem:   Hypotension   30 Day Unplanned Readmission Risk Score    Flowsheet Row ED to Hosp-Admission (Current) from 06/27/2021 in [place] (1C)  30 Day Unplanned Readmission Risk Score (%) 36.87 Filed at 07/03/2021 0801       This score is the patient's risk of an unplanned readmission within 30 days of being discharged (0 -100%). The score is based on dignosis, age, lab data, medications, orders, and past utilization.   Low:  0-14.9   Medium: 15-21.9   High: 22-29.9   Extreme: 30 and above         Discharge Instructions:  Allergies as of 07/03/2021       Reactions   Celebrex [celecoxib] Anaphylaxis   Glipizide Anaphylaxis   Levaquin [levofloxacin In D5w] Other (See Comments)   Heart arrhthymias   Levofloxacin Other (See Comments), Palpitations   ICD fired   Lisinopril Swelling   Lip and facial swelling   Sulfa Antibiotics Other (See Comments), Anaphylaxis   Reaction: unknown   Metformin Other (See Comments)   Gi tolerance    Penicillins Rash, Other (See Comments)   Has patient had a PCN reaction  causing immediate rash, facial/tongue/throat swelling, SOB or lightheadedness with hypotension: Unknown Has patient had a PCN reaction causing severe rash involving mucus membranes or skin necrosis: No Has patient had a PCN reaction that required hospitalization: No Has patient had a PCN reaction occurring within the last 10 years: No If all of the above answers are "NO", then may proceed with Cephalosporin use.        Medication List     STOP taking these medications    gabapentin 100 MG capsule Commonly known as: NEURONTIN   gabapentin 600 MG tablet Commonly known as: NEURONTIN   hydrocortisone 2.5 % rectal cream Commonly known as: ANUSOL-HC   mexiletine 200 MG capsule Commonly known as: MEXITIL   PROBIOTIC DAILY PO   Santyl ointment Generic drug: collagenase   spironolactone 25 MG tablet Commonly known as: ALDACTONE       TAKE these medications    albumin human 25 % bottle Inject 100 mLs (25 g total) into the vein as needed (Give with paracentesis).   allopurinol 300 MG tablet Commonly known as: ZYLOPRIM Take 300 mg by mouth  daily.   aspirin EC 81 MG tablet Take 81 mg by mouth at bedtime.   colchicine 0.6 MG tablet Take by mouth.   docusate sodium 100 MG capsule Commonly known as: COLACE Take 100 mg by mouth 2 (two) times daily.   doxycycline 100 MG tablet Commonly known as: VIBRA-TABS Take 1 tablet (100 mg total) by mouth daily. For SBP ppx.   Ensure Max Protein Liqd Take 330 mLs (11 oz total) by mouth 2 (two) times daily between meals.   fexofenadine 180 MG tablet Commonly known as: ALLEGRA Take 180 mg by mouth daily.   FLUoxetine 10 MG capsule Commonly known as: PROZAC Take 10 mg by mouth daily.   levothyroxine 75 MCG tablet Commonly known as: SYNTHROID Take 1 tablet (75 mcg total) by mouth daily before breakfast. On Monday, Wednesday and Friday   levothyroxine 50 MCG tablet Commonly known as: SYNTHROID Take 50 mcg by mouth daily  before breakfast.   lidocaine 5 % Commonly known as: Lidoderm Place 1 patch onto the skin every 12 (twelve) hours. Remove & Discard patch within 12 hours or as directed by MD   midodrine 10 MG tablet Commonly known as: PROAMATINE Take 10 mg by mouth 3 (three) times daily.   morphine 15 MG tablet Commonly known as: MSIR Take 1 tablet (15 mg total) by mouth every 6 (six) hours as needed for severe pain or moderate pain.   OXYGEN Inhale 2 L into the lungs continuous.   pantoprazole 40 MG tablet Commonly known as: PROTONIX Take 40 mg by mouth daily.   simvastatin 20 MG tablet Commonly known as: ZOCOR Take 20 mg by mouth at bedtime.   torsemide 20 MG tablet Commonly known as: DEMADEX Take 40 mg by mouth as needed.   traZODone 50 MG tablet Commonly known as: DESYREL Take 50 mg by mouth at bedtime.         Contact information for follow-up providers     Ok Edwards, NP Follow up in 1 week(s).   Specialty: Gastroenterology Why: Facility to make follow up appt Contact information: [place] [address] [phone]         [name], MD Follow up.   Specialty: Family Medicine Why: Facility to make follow up appt Contact information: [place] [address] [phone]              Contact information for after-discharge care     Destination     [facility] .   Service: Skilled Nursing Contact information: [place] [address] [phone]                     Allergies  Allergen Reactions   Celebrex [Celecoxib] Anaphylaxis   Glipizide Anaphylaxis   Levaquin [Levofloxacin In D5w] Other (See Comments)    Heart arrhthymias   Levofloxacin Other (See Comments) and Palpitations    ICD fired   Lisinopril Swelling    Lip and facial swelling   Sulfa Antibiotics Other (See Comments) and Anaphylaxis     Reaction: unknown   Metformin Other (See Comments)    Gi tolerance    Penicillins Rash and Other (See Comments)    Has patient had a PCN reaction causing immediate rash, facial/tongue/throat swelling, SOB or lightheadedness with hypotension: Unknown Has patient had a PCN reaction causing severe rash involving mucus membranes or skin necrosis: No Has patient  had a PCN reaction that required hospitalization: No Has patient had a PCN reaction occurring within the last 10 years: No If all of the above answers are "NO", then may proceed with Cephalosporin use.      The results of significant diagnostics from this hospitalization (including imaging, microbiology, ancillary and laboratory) are listed below for reference.   Consultations:   Procedures/Studies: CT ABDOMEN PELVIS WO CONTRAST  Result Date: 06/27/2021 CLINICAL DATA:  Abdominal distension.  Sepsis.  Hypotension. EXAM: CT ABDOMEN AND PELVIS WITHOUT CONTRAST TECHNIQUE: Multidetector CT imaging of the abdomen and pelvis was performed following the standard protocol without IV contrast. RADIATION DOSE REDUCTION: This exam was performed according to the departmental dose-optimization program which includes automated exposure control, adjustment of the mA and/or kV according to patient size and/or use of iterative reconstruction technique. COMPARISON:  06/18/2020 FINDINGS: Lower chest: Heart is markedly enlarged. Small right pleural effusion noted. Bibasilar atelectasis evident. Hepatobiliary: Nodular liver contour is compatible with cirrhosis. Gallbladder surgically absent. No intrahepatic or extrahepatic biliary dilation. Pancreas: No focal mass lesion. No dilatation of the main duct. No intraparenchymal cyst. No peripancreatic edema. Spleen: No splenomegaly. No focal mass lesion. Adrenals/Urinary Tract: Stable bilateral adrenal adenomas. Cortical scarring noted lower pole right kidney with punctate nonobstructing stones in the interpolar  region. Stable 6 mm stone lower pole left kidney with punctate nonobstructing stone interpolar left kidney. No evidence for hydroureter. Bladder is obscured by beam hardening artifact from right hip replacement but there appears to be of Foley catheter in place in gas in the bladder lumen is compatible with the instrumentation. Stomach/Bowel: Stomach is nondistended. Duodenum is normally positioned as is the ligament of Treitz. No small bowel wall thickening. No small bowel dilatation. No gross colonic mass. No colonic wall thickening. Diverticular changes are noted in the left colon without evidence of diverticulitis. Vascular/Lymphatic: There is advanced atherosclerotic calcification of the abdominal aorta without aneurysm. There is no gastrohepatic or hepatoduodenal ligament lymphadenopathy. No retroperitoneal or mesenteric lymphadenopathy. No pelvic sidewall lymphadenopathy. Reproductive: Inferior pelvis obscured by beam hardening artifact. Lobular soft tissue in the upper central pelvis may be related to the uterus/ovaries but cannot be definitively characterized. Other: Moderate volume abdominopelvic ascites. Musculoskeletal: Status post right hip replacement No worrisome lytic or sclerotic osseous abnormality. IMPRESSION: 1. No acute findings in the abdomen or pelvis. 2. Nodular liver contour compatible with cirrhosis. 3. Moderate volume abdominopelvic ascites. 4. Stable bilateral adrenal adenomas. 5. Bilateral nonobstructing renal stones. 6. Lobular soft tissue in the upper central pelvis may be related to the uterus/ovaries but cannot be definitively characterized and has been incompletely visualized. Pelvic ultrasound recommended to further evaluate. 7. Cardiomegaly with small right pleural effusion. 8. Aortic Atherosclerosis (ICD10-I70.0). Electronically Signed   By: Misty Stanley M.D.   On: 06/27/2021 16:22   DG Chest 2 View  Result Date: 06/26/2021 CLINICAL DATA:  Shortness of breath EXAM: CHEST - 2  VIEW COMPARISON:  Chest radiograph dated February 12, 2021 FINDINGS: The heart is markedly enlarged. 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 No focal consolidation or pleural effusion. No acute osseous abnormality. IMPRESSION: Cardiomegaly without evidence of focal consolidation or pleural effusion. Electronically Signed   By: Keane Police D.O.   On: 06/26/2021 18:39   DG Tibia/Fibula Left  Result Date: 06/27/2021 CLINICAL DATA:  Shortness of breath.  Status post fall. EXAM: LEFT TIBIA AND FIBULA - 2 VIEW COMPARISON:  None. FINDINGS: There is a fracture deformity involving the proximal metadiaphysis  of the fibula. The fracture fragments appear to be in near anatomic alignment. No additional fracture or dislocation identified. IMPRESSION: Acute fracture of the proximal metadiaphysis of the fibula. Electronically Signed   By: Kerby Moors M.D.   On: 06/27/2021 07:56   DG Ankle Complete Left  Result Date: 06/27/2021 CLINICAL DATA:  Multiple falls, ankle injury, initial encounter. EXAM: LEFT ANKLE COMPLETE - 3+ VIEW COMPARISON:  Left tibia fibula 06/27/2021. FINDINGS: Ankle mortise is intact.  No fracture. IMPRESSION: No acute findings. Electronically Signed   By: Lorin Picket M.D.   On: 06/27/2021 08:50   DG Abd 1 View  Result Date: 06/27/2021 CLINICAL DATA:  Abdominal distension. EXAM: ABDOMEN - 1 VIEW COMPARISON:  CT 02/12/2021.  Radiographs 07/16/2019. FINDINGS: 1330 hours. Two views submitted. The bowel gas pattern appears nonobstructive. There is no supine evidence of bowel wall thickening or free intraperitoneal air. Scattered vascular calcifications are noted. Patient is status post cholecystectomy and right total hip arthroplasty. IMPRESSION: No radiographic evidence of active abdominal process. No findings to suggest recurrent large volume ascites. Electronically Signed   By: Richardean Sale M.D.   On: 06/27/2021 13:43   CT HEAD WO CONTRAST  (5MM)  Result Date: 06/27/2021 CLINICAL DATA:  Head trauma, moderate-severe; Neck trauma (Age >= 65y) EXAM: CT HEAD WITHOUT CONTRAST CT CERVICAL SPINE WITHOUT CONTRAST TECHNIQUE: Multidetector CT imaging of the head and cervical spine was performed following the standard protocol without intravenous contrast. Multiplanar CT image reconstructions of the cervical spine were also generated. RADIATION DOSE REDUCTION: This exam was performed according to the departmental dose-optimization program which includes automated exposure control, adjustment of the mA and/or kV according to patient size and/or use of iterative reconstruction technique. COMPARISON:  February 14, 23. FINDINGS: CT HEAD FINDINGS Brain: No evidence of acute infarction, hemorrhage, hydrocephalus, extra-axial collection or mass lesion/mass effect. Similar chronic right frontal encephalomalacia. Similar additional focal encephalomalacia in the right perirolandic frontoparietal cortex. Similar additional patchy white matter hypoattenuation, nonspecific but compatible with chronic microvascular ischemic disease. Partially empty sella. Vascular: Calcific intracranial atherosclerosis. No hyperdense vessel identified. Skull: No evidence of acute fracture. Sinuses/Orbits: Opacified anterior left ethmoid air cells. Otherwise, visualized sinuses are clear. Unremarkable orbits. Other: No mastoid effusions.  Cerumen in bilateral EACs. CT CERVICAL SPINE FINDINGS Alignment: Similar mild anterolisthesis of C3 on C4, C4 on C5, and C5 on C6. No new sagittal subluxation. Skull base and vertebrae: Unchanged vertebral body heights. No evidence of acute fracture. Soft tissues and spinal canal: No prevertebral fluid or swelling. No visible canal hematoma. Disc levels:  Similar multilevel degenerative change, mild for age. Upper chest: Interlobular septal thickening in the right greater than left lung apices with possible small layering right pleural effusion and  emphysema. IMPRESSION: CT head: 1. No evidence of acute intracranial abnormality. 2. 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 CT cervical spine: 1. No evidence of acute fracture or traumatic malalignment. 2. Partially imaged interlobular septal thickening in the lung apices, compatible with mild interstitial edema. Possible small layering right pleural effusion. Emphysema (ICD10-J43.9). Electronically Signed   By: Margaretha Sheffield M.D.   On: 06/27/2021 08:10   CT Head Wo Contrast  Result Date: 06/26/2021 CLINICAL DATA:  Mental status change, unknown cause; Neck pain, acute, no red flags EXAM: CT HEAD WITHOUT CONTRAST CT CERVICAL SPINE WITHOUT CONTRAST TECHNIQUE: Multidetector CT imaging of the head and cervical spine was performed following the standard protocol without intravenous contrast. Multiplanar CT image reconstructions of the cervical spine were  also generated. RADIATION DOSE REDUCTION: This exam was performed according to the departmental dose-optimization program which includes automated exposure control, adjustment of the mA and/or kV according to patient size and/or use of iterative reconstruction technique. COMPARISON:  CT cervical spine 03/28/2020 FINDINGS: CT HEAD FINDINGS BRAIN: BRAIN Patchy and confluent areas of decreased attenuation are noted throughout the deep and periventricular white matter of the cerebral hemispheres bilaterally, compatible with chronic microvascular ischemic disease. Right frontal lobe encephalomalacia. No evidence of large-territorial acute infarction. No parenchymal hemorrhage. No mass lesion. No extra-axial collection. No mass effect or midline shift. No hydrocephalus. Basilar cisterns are patent. Vascular: No hyperdense vessel. Skull: No acute fracture or focal lesion. Sinuses/Orbits: Paranasal sinuses and mastoid air cells are clear. Bilateral lens replacement. Otherwise the orbits are unremarkable. Other: None. CT CERVICAL SPINE  FINDINGS Alignment: Stable grade 1 anterolisthesis of C3 on C4, C4 on C5, C5 on C6. Skull base and vertebrae: Mild multilevel degenerative changes of the spine. No associated severe osseous central canal or neural foraminal stenosis. No acute fracture. No aggressive appearing focal osseous lesion or focal pathologic process. Soft tissues and spinal canal: No prevertebral fluid or swelling. No visible canal hematoma. Upper chest: Unremarkable. Other: Atherosclerotic plaque of the carotid arteries within the neck. IMPRESSION: 1. No acute intracranial abnormality. 2. No acute displaced fracture or traumatic listhesis of the cervical spine. Electronically Signed   By: Iven Finn M.D.   On: 06/26/2021 20:41   CT Cervical Spine Wo Contrast  Result Date: 06/27/2021 CLINICAL DATA:  Head trauma, moderate-severe; Neck trauma (Age >= 65y) EXAM: CT HEAD WITHOUT CONTRAST CT CERVICAL SPINE WITHOUT CONTRAST TECHNIQUE: Multidetector CT imaging of the head and cervical spine was performed following the standard protocol without intravenous contrast. Multiplanar CT image reconstructions of the cervical spine were also generated. RADIATION DOSE REDUCTION: This exam was performed according to the departmental dose-optimization program which includes automated exposure control, adjustment of the mA and/or kV according to patient size and/or use of iterative reconstruction technique. COMPARISON:  February 14, 23. FINDINGS: CT HEAD FINDINGS Brain: No evidence of acute infarction, hemorrhage, hydrocephalus, extra-axial collection or mass lesion/mass effect. Similar chronic right frontal encephalomalacia. Similar additional focal encephalomalacia in the right perirolandic frontoparietal cortex. Similar additional patchy white matter hypoattenuation, nonspecific but compatible with chronic microvascular ischemic disease. Partially empty sella. Vascular: Calcific intracranial atherosclerosis. No hyperdense vessel identified. Skull:  No evidence of acute fracture. Sinuses/Orbits: Opacified anterior left ethmoid air cells. Otherwise, visualized sinuses are clear. Unremarkable orbits. Other: No mastoid effusions.  Cerumen in bilateral EACs. CT CERVICAL SPINE FINDINGS Alignment: Similar mild anterolisthesis of C3 on C4, C4 on C5, and C5 on C6. No new sagittal subluxation. Skull base and vertebrae: Unchanged vertebral body heights. No evidence of acute fracture. Soft tissues and spinal canal: No prevertebral fluid or swelling. No visible canal hematoma. Disc levels:  Similar multilevel degenerative change, mild for age. Upper chest: Interlobular septal thickening in the right greater than left lung apices with possible small layering right pleural effusion and emphysema. IMPRESSION: CT head: 1. No evidence of acute intracranial abnormality. 2. 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 CT cervical spine: 1. No evidence of acute fracture or traumatic malalignment. 2. Partially imaged interlobular septal thickening in the lung apices, compatible with mild interstitial edema. Possible small layering right pleural effusion. Emphysema (ICD10-J43.9). Electronically Signed   By: Margaretha Sheffield M.D.   On: 06/27/2021 08:10   CT Cervical Spine Wo Contrast  Result Date: 06/26/2021 CLINICAL  DATA:  Mental status change, unknown cause; Neck pain, acute, no red flags EXAM: CT HEAD WITHOUT CONTRAST CT CERVICAL SPINE WITHOUT CONTRAST TECHNIQUE: Multidetector CT imaging of the head and cervical spine was performed following the standard protocol without intravenous contrast. Multiplanar CT image reconstructions of the cervical spine were also generated. RADIATION DOSE REDUCTION: This exam was performed according to the departmental dose-optimization program which includes automated exposure control, adjustment of the mA and/or kV according to patient size and/or use of iterative reconstruction technique. COMPARISON:  CT cervical spine  03/28/2020 FINDINGS: CT HEAD FINDINGS BRAIN: BRAIN Patchy and confluent areas of decreased attenuation are noted throughout the deep and periventricular white matter of the cerebral hemispheres bilaterally, compatible with chronic microvascular ischemic disease. Right frontal lobe encephalomalacia. No evidence of large-territorial acute infarction. No parenchymal hemorrhage. No mass lesion. No extra-axial collection. No mass effect or midline shift. No hydrocephalus. Basilar cisterns are patent. Vascular: No hyperdense vessel. Skull: No acute fracture or focal lesion. Sinuses/Orbits: Paranasal sinuses and mastoid air cells are clear. Bilateral lens replacement. Otherwise the orbits are unremarkable. Other: None. CT CERVICAL SPINE FINDINGS Alignment: Stable grade 1 anterolisthesis of C3 on C4, C4 on C5, C5 on C6. Skull base and vertebrae: Mild multilevel degenerative changes of the spine. No associated severe osseous central canal or neural foraminal stenosis. No acute fracture. No aggressive appearing focal osseous lesion or focal pathologic process. Soft tissues and spinal canal: No prevertebral fluid or swelling. No visible canal hematoma. Upper chest: Unremarkable. Other: Atherosclerotic plaque of the carotid arteries within the neck. IMPRESSION: 1. No acute intracranial abnormality. 2. No acute displaced fracture or traumatic listhesis of the cervical spine. Electronically Signed   By: Iven Finn M.D.   On: 06/26/2021 20:41   US Venous Img Lower Bilateral (DVT)  Result Date: 06/27/2021 CLINICAL DATA:  Bilateral lower extremity pain and edema. History of smoking and malignancy. Evaluate for DVT. EXAM: BILATERAL LOWER EXTREMITY VENOUS DOPPLER ULTRASOUND TECHNIQUE: Gray-scale sonography with graded compression, as well as color Doppler and duplex ultrasound were performed to evaluate the lower extremity deep venous systems from the level of the common femoral vein and including the common femoral, femoral,  profunda femoral, popliteal and calf veins including the posterior tibial, peroneal and gastrocnemius veins when visible. The superficial great saphenous vein was also interrogated. Spectral Doppler was utilized to evaluate flow at rest and with distal augmentation maneuvers in the common femoral, femoral and popliteal veins. COMPARISON:  None. FINDINGS: RIGHT LOWER EXTREMITY Common Femoral Vein: No evidence of thrombus. Normal compressibility, respiratory phasicity and response to augmentation. Saphenofemoral Junction: No evidence of thrombus. Normal compressibility and flow on color Doppler imaging. Profunda Femoral Vein: No evidence of thrombus. Normal compressibility and flow on color Doppler imaging. Femoral Vein: No evidence of thrombus. Normal compressibility, respiratory phasicity and response to augmentation. Popliteal Vein: No evidence of thrombus. Normal compressibility, respiratory phasicity and response to augmentation. Calf Veins: No evidence of thrombus. Normal compressibility and flow on color Doppler imaging. Superficial Great Saphenous Vein: No evidence of thrombus. Normal compressibility. Venous Reflux:  None. Other Findings:  None. LEFT LOWER EXTREMITY Common Femoral Vein: No evidence of thrombus. Normal compressibility, respiratory phasicity and response to augmentation. Saphenofemoral Junction: No evidence of thrombus. Normal compressibility and flow on color Doppler imaging. Profunda Femoral Vein: No evidence of thrombus. Normal compressibility and flow on color Doppler imaging. Femoral Vein: No evidence of thrombus. Normal compressibility, respiratory phasicity and response to augmentation. Popliteal Vein: No evidence of thrombus. Normal  compressibility, respiratory phasicity and response to augmentation. Calf Veins: No evidence of thrombus. Normal compressibility and flow on color Doppler imaging. Superficial Great Saphenous Vein: No evidence of thrombus. Normal compressibility. Venous  Reflux:  None. Other Findings:  None. IMPRESSION: No evidence of DVT within either lower extremity. Electronically Signed   By: Sandi Mariscal M.D.   On: 06/27/2021 15:59   US Paracentesis  Result Date: 06/27/2021 INDICATION: Patient with a history of cirrhosis and recurrent ascites admitted with sepsis. Interventional radiology asked to perform a diagnostic and therapeutic paracentesis. EXAM: ULTRASOUND GUIDED PARACENTESIS MEDICATIONS: 1% lidocaine 10 mL COMPLICATIONS: None immediate. PROCEDURE: Informed written consent was obtained from the patient after a discussion of the risks, benefits and alternatives to treatment. A timeout was performed prior to the initiation of the procedure. Initial ultrasound scanning demonstrates a large amount of ascites within the left lower abdominal quadrant. The left lower abdomen was prepped and draped in the usual sterile fashion. 1% lidocaine was used for local anesthesia. Procedure was done at the bedside in the ICU due to patient's critical status. Following this, a 19 gauge, 7-cm, Yueh catheter was introduced. An ultrasound image was saved for documentation purposes. The paracentesis was performed. The catheter was removed and a dressing was applied. The patient tolerated the procedure well without immediate post procedural complication. FINDINGS: A total of approximately 3.2 L of clear yellow fluid was removed. Samples were sent to the laboratory as requested by the clinical team. IMPRESSION: Successful ultrasound-guided paracentesis yielding 3.2 liters of peritoneal fluid. Read by: Soyla Dryer, NP Electronically Signed   By: Albin Felling M.D.   On: 06/27/2021 16:12   US Paracentesis  Result Date: 06/14/2021 INDICATION: Recurrent ascites request received for therapeutic paracentesis. EXAM: ULTRASOUND GUIDED  PARACENTESIS MEDICATIONS: Local 1% lidocaine only. COMPLICATIONS: None immediate. PROCEDURE: Informed written consent was obtained from the patient after a  discussion of the risks, benefits and alternatives to treatment. A timeout was performed prior to the initiation of the procedure. Initial ultrasound scanning demonstrates a moderate amount of ascites within the right lower abdominal quadrant. The right lower abdomen was prepped and draped in the usual sterile fashion. 1% lidocaine was used for local anesthesia. Following this, a 19 gauge, 7-cm, Yueh catheter was introduced. An ultrasound image was saved for documentation purposes. The paracentesis was performed. The catheter was removed and a dressing was applied. The patient tolerated the procedure well without immediate post procedural complication. FINDINGS: A total of approximately 3.6 L of yellow-colored fluid was removed. IMPRESSION: Successful ultrasound-guided paracentesis yielding 3.6 liters of peritoneal fluid. This exam was performed by Tsosie Billing PA-C, and was supervised and interpreted by Dr. Denna Haggard. Electronically Signed   By: Albin Felling M.D.   On: 06/14/2021 07:48   DG Chest Portable 1 View  Result Date: 06/27/2021 CLINICAL DATA:  Shortness of breath.  Left leg pain.  Fall. EXAM: PORTABLE CHEST 1 VIEW COMPARISON:  06/26/2021 FINDINGS: Left chest wall ICD is noted with leads in the right atrial appendage and right ventricle. Cardiac enlargement. Lung volumes are low. Mild diffuse increase interstitial markings are noted bilaterally concerning for mild edema. No airspace consolidation. Atelectasis is noted within the right base. IMPRESSION: 1. Cardiac enlargement and mild pulmonary edema. 2. Low lung volumes with right lung base atelectasis. Electronically Signed   By: Kerby Moors M.D.   On: 06/27/2021 07:53      Labs: BNP (last 3 results) Recent Labs    06/27/21 0634  BNP 869.8*  Basic Metabolic Panel: Recent Labs  Lab 06/27/21 1336 06/28/21 0453 06/29/21 0354 06/30/21 0414 07/01/21 0839 07/02/21 0621 07/03/21 0413  NA  --  138 138 138 137 138 138  K  --  4.1 3.7  3.7 3.7 3.7 4.0  CL  --  104 105 105 102 105 104  CO2  --  22 22 23  21* 22 20*  GLUCOSE  --  126* 121* 121* 124* 158* 128*  BUN  --  51* 50* 53* 53* 52* 55*  CREATININE  --  2.60* 2.50* 2.33* 2.31* 1.96* 1.91*  CALCIUM  --  8.2* 8.2* 8.2* 8.3* 8.5* 8.7*  MG 2.3 2.4 2.5* 2.5* 2.6* 2.5* 2.5*  PHOS 6.6* 6.5*  --   --   --   --   --    Liver Function Tests: Recent Labs  Lab 06/26/21 1813 06/27/21 0634  AST 18 17  ALT 9 8  ALKPHOS 114 104  BILITOT 0.6 0.7  PROT 6.5 6.0*  ALBUMIN 3.3* 2.9*   Recent Labs  Lab 06/27/21 0634  LIPASE 49   Recent Labs  Lab 06/26/21 2108  AMMONIA 18   CBC: Recent Labs  Lab 06/26/21 1813 06/27/21 0634 06/27/21 1336 06/29/21 0354 06/30/21 0414 07/01/21 0839 07/02/21 0621 07/03/21 0413  WBC 11.0* 8.9   < > 5.6 5.0 8.4 7.4 8.4  NEUTROABS 8.9* 6.4  --   --   --   --   --   --   HGB 7.4* 6.7*   < > 8.9* 8.8* 9.1* 9.0* 9.2*  HCT 25.4* 22.9*   < > 28.2* 28.9* 29.8* 29.7* 29.8*  MCV 94.8 95.0   < > 91.0 92.0 92.5 93.1 92.8  PLT 231 211   < > 149* 159 167 157 178   < > = values in this interval not displayed.   Cardiac Enzymes: No results for input(s): CKTOTAL, CKMB, CKMBINDEX, TROPONINI in the last 168 hours. BNP: Invalid input(s): POCBNP CBG: Recent Labs  Lab 06/27/21 1127  GLUCAP 87   D-Dimer No results for input(s): DDIMER in the last 72 hours. Hgb A1c No results for input(s): HGBA1C in the last 72 hours. Lipid Profile No results for input(s): CHOL, HDL, LDLCALC, TRIG, CHOLHDL, LDLDIRECT in the last 72 hours. Thyroid function studies No results for input(s): TSH, T4TOTAL, T3FREE, THYROIDAB in the last 72 hours.  Invalid input(s): FREET3 Anemia work up No results for input(s): VITAMINB12, FOLATE, FERRITIN, TIBC, IRON, RETICCTPCT in the last 72 hours. Urinalysis    Component Value Date/Time   COLORURINE YELLOW (A) 06/27/2021 0755   APPEARANCEUR CLEAR (A) 06/27/2021 0755   LABSPEC 1.011 06/27/2021 0755   PHURINE 5.0  06/27/2021 0755   GLUCOSEU NEGATIVE 06/27/2021 0755   HGBUR NEGATIVE 06/27/2021 0755   BILIRUBINUR NEGATIVE 06/27/2021 0755   KETONESUR NEGATIVE 06/27/2021 0755   PROTEINUR NEGATIVE 06/27/2021 0755   NITRITE NEGATIVE 06/27/2021 0755   LEUKOCYTESUR NEGATIVE 06/27/2021 0755   Sepsis Labs Invalid input(s): PROCALCITONIN,  WBC,  LACTICIDVEN Microbiology Recent Results (from the past 240 hour(s))  Resp Panel by RT-PCR (Flu A&B, Covid) Nasopharyngeal Swab     Status: None   Collection Time: 06/27/21  7:55 AM   Specimen: Nasopharyngeal Swab; Nasopharyngeal(NP) swabs in vial transport medium  Result Value Ref Range Status   SARS Coronavirus 2 by RT PCR NEGATIVE NEGATIVE Final    Comment: (NOTE) SARS-CoV-2 target nucleic acids are NOT DETECTED.  The SARS-CoV-2 RNA is generally detectable in upper respiratory specimens during  the acute phase of infection. The lowest concentration of SARS-CoV-2 viral copies this assay can detect is 138 copies/mL. A negative result does not preclude SARS-Cov-2 infection and should not be used as the sole basis for treatment or other patient management decisions. A negative result may occur with  improper specimen collection/handling, submission of specimen other than nasopharyngeal swab, presence of viral mutation(s) within the areas targeted by this assay, and inadequate number of viral copies(<138 copies/mL). A negative result must be combined with clinical observations, patient history, and epidemiological information. The expected result is Negative.  Fact Sheet for Patients:  EntrepreneurPulse.com.au  Fact Sheet for Healthcare Providers:  IncredibleEmployment.be  This test is no t yet approved or cleared by the [place] FDA and  has been authorized for detection and/or diagnosis of SARS-CoV-2 by FDA under an Emergency Use Authorization (EUA). This EUA will remain  in effect (meaning this test can be used)  for the duration of the COVID-19 declaration under Section 564(b)(1) of the Act, 21 U.S.C.section 360bbb-3(b)(1), unless the authorization is terminated  or revoked sooner.       Influenza A by PCR NEGATIVE NEGATIVE Final   Influenza B by PCR NEGATIVE NEGATIVE Final    Comment: (NOTE) The Xpert Xpress SARS-CoV-2/FLU/RSV plus assay is intended as an aid in the diagnosis of influenza from Nasopharyngeal swab specimens and should not be used as a sole basis for treatment. Nasal washings and aspirates are unacceptable for Xpert Xpress SARS-CoV-2/FLU/RSV testing.  Fact Sheet for Patients: EntrepreneurPulse.com.au  Fact Sheet for Healthcare Providers: IncredibleEmployment.be  This test is not yet approved or cleared by the [place] FDA and has been authorized for detection and/or diagnosis of SARS-CoV-2 by FDA under an Emergency Use Authorization (EUA). This EUA will remain in effect (meaning this test can be used) for the duration of the COVID-19 declaration under Section 564(b)(1) of the Act, 21 U.S.C. section 360bbb-3(b)(1), unless the authorization is terminated or revoked.  Performed at [facility], [address] [place], [place] [address]   Blood culture (single)     Status: None   Collection Time: 06/27/21  7:55 AM   Specimen: BLOOD  Result Value Ref Range Status   Specimen Description BLOOD BLOOD RIGHT FOREARM  Final   Special Requests   Final    BOTTLES DRAWN AEROBIC AND ANAEROBIC Blood Culture results may not be optimal due to an excessive volume of blood received in culture bottles   Culture   Final    NO GROWTH 5 DAYS Performed at [facility], [address] [place], [place] [address]    Report Status 07/02/2021 FINAL  Final  Blood culture (single)     Status: Abnormal   Collection Time: 06/27/21 10:08 AM   Specimen: BLOOD LEFT HAND  Result Value Ref Range Status   Specimen Description   Final     BLOOD LEFT HAND Performed at [facility], [address], [place], [place] [address]    Special Requests   Final    BOTTLES DRAWN AEROBIC AND ANAEROBIC Blood Culture adequate volume Performed at [facility], [address] [place], [place] [address]    Culture  Setup Time   Final    Organism ID to follow [place] CRITICAL RESULT CALLED TO, READ BACK BY AND VERIFIED WITH: KRISTIN MILLER 06/28/21 0741 BY [email or web address] Performed at [facility], [address], [place], [place] [address]    Culture STREPTOCOCCUS PARASANGUINIS (A)  Final   Report Status 07/01/2021 FINAL  Final   Organism ID, Bacteria STREPTOCOCCUS PARASANGUINIS  Final      Susceptibility   Streptococcus parasanguinis - MIC*    PENICILLIN 0.25 INTERMEDIATE Intermediate     CEFTRIAXONE 0.5 SENSITIVE Sensitive     LEVOFLOXACIN >=16 RESISTANT Resistant     VANCOMYCIN 0.5 SENSITIVE Sensitive     * STREPTOCOCCUS PARASANGUINIS  Blood Culture ID Panel (Reflexed)     Status: Abnormal   Collection Time: 06/27/21 10:08 AM  Result Value Ref Range Status   Enterococcus faecalis NOT DETECTED NOT DETECTED Final   Enterococcus Faecium NOT DETECTED NOT DETECTED Final   Listeria monocytogenes NOT DETECTED NOT DETECTED Final   Staphylococcus species NOT DETECTED NOT DETECTED Final   Staphylococcus aureus (BCID) NOT DETECTED NOT DETECTED Final   Staphylococcus epidermidis NOT DETECTED NOT DETECTED Final   Staphylococcus lugdunensis NOT DETECTED NOT DETECTED Final   Streptococcus species DETECTED (A) NOT DETECTED Final    Comment: Not Enterococcus species, Streptococcus agalactiae, Streptococcus pyogenes, or Streptococcus pneumoniae. CRITICAL RESULT CALLED TO, READ BACK BY AND VERIFIED WITH: KRISTIN MILLER 06/28/21 0741 MW    Streptococcus agalactiae NOT DETECTED NOT DETECTED Final   Streptococcus pneumoniae NOT DETECTED NOT DETECTED Final   Streptococcus pyogenes NOT DETECTED  NOT DETECTED Final   A.calcoaceticus-baumannii NOT DETECTED NOT DETECTED Final   Bacteroides fragilis NOT DETECTED NOT DETECTED Final   Enterobacterales NOT DETECTED NOT DETECTED Final   Enterobacter cloacae complex NOT DETECTED NOT DETECTED Final   Escherichia coli NOT DETECTED NOT DETECTED Final   Klebsiella aerogenes NOT DETECTED NOT DETECTED Final   Klebsiella oxytoca NOT DETECTED NOT DETECTED Final   Klebsiella pneumoniae NOT DETECTED NOT DETECTED Final   Proteus species NOT DETECTED NOT DETECTED Final   Salmonella species NOT DETECTED NOT DETECTED Final   Serratia marcescens NOT DETECTED NOT DETECTED Final   Haemophilus influenzae NOT DETECTED NOT DETECTED Final   Neisseria meningitidis NOT DETECTED NOT DETECTED Final   Pseudomonas aeruginosa NOT DETECTED NOT DETECTED Final   Stenotrophomonas maltophilia NOT DETECTED NOT DETECTED Final   Candida albicans NOT DETECTED NOT DETECTED Final   Candida auris NOT DETECTED NOT DETECTED Final   Candida glabrata NOT DETECTED NOT DETECTED Final   Candida krusei NOT DETECTED NOT DETECTED Final   Candida parapsilosis NOT DETECTED NOT DETECTED Final   Candida tropicalis NOT DETECTED NOT DETECTED Final   Cryptococcus neoformans/gattii NOT DETECTED NOT DETECTED Final    Comment: Performed at [facility], [address] [place], [place] [address]  MRSA Next Gen by PCR, Nasal     Status: None   Collection Time: 06/27/21 11:30 AM   Specimen: Nasal Mucosa; Nasal Swab  Result Value Ref Range Status   MRSA by PCR Next Gen NOT DETECTED NOT DETECTED Final    Comment: (NOTE) The GeneXpert MRSA Assay (FDA approved for NASAL specimens only), is one component of a comprehensive MRSA colonization surveillance program. It is not intended to diagnose MRSA infection nor to guide or monitor treatment for MRSA infections. Test performance is not FDA approved in patients less than 56 years old. Performed at [facility], [address] [place], [place] [address]   Body fluid culture w Gram Stain     Status: None   Collection Time: 06/27/21  3:00 PM   Specimen: PATH Cytology Peritoneal fluid  Result Value Ref Range Status   Specimen Description   Final    PERITONEAL Performed at [facility], [address], [place], [place] [address]    Special Requests   Final    NONE Performed at [facility], [place], [place] [address]    Gram Stain   Final    RARE WBC PRESENT,BOTH PMN AND MONONUCLEAR NO ORGANISMS SEEN    Culture   Final    NO GROWTH 3 DAYS Performed at [facility], [place] [address] [place], [place] [address]    Report Status 07/01/2021 FINAL  Final  Resp Panel by RT-PCR (Flu A&B, Covid) Nasopharyngeal Swab     Status: None   Collection Time: 07/03/21 10:44 AM   Specimen: Nasopharyngeal Swab; Nasopharyngeal(NP) swabs in vial transport medium  Result Value Ref Range Status   SARS Coronavirus 2 by RT PCR NEGATIVE NEGATIVE Final    Comment: (NOTE) SARS-CoV-2 target nucleic acids are NOT DETECTED.  The SARS-CoV-2 RNA is generally detectable in upper respiratory specimens during the acute phase of infection. The lowest concentration of SARS-CoV-2 viral copies this assay can detect is 138 copies/mL. A negative result does not preclude SARS-Cov-2 infection and should not be used as the sole basis for treatment or other patient management decisions. A negative result may occur with  improper specimen collection/handling, submission of specimen other than nasopharyngeal swab, presence of viral mutation(s) within the areas targeted by this assay, and inadequate number of viral copies(<138 copies/mL). A negative result must be combined with clinical observations, patient history, and epidemiological information. The expected result is Negative.  Fact Sheet for Patients:  EntrepreneurPulse.com.au  Fact Sheet for Healthcare Providers:   IncredibleEmployment.be  This test is no t yet approved or cleared by the [place] FDA and  has been authorized for detection and/or diagnosis of SARS-CoV-2 by FDA under an Emergency Use Authorization (EUA). This EUA will remain  in effect (meaning this test can be used) for the duration of the COVID-19 declaration under Section 564(b)(1) of the Act, 21 U.S.C.section 360bbb-3(b)(1), unless the authorization is terminated  or revoked sooner.       Influenza A by PCR NEGATIVE NEGATIVE Final   Influenza B by PCR NEGATIVE NEGATIVE Final    Comment: (NOTE) The Xpert Xpress SARS-CoV-2/FLU/RSV plus assay is intended as an aid in the diagnosis of influenza from Nasopharyngeal swab specimens and should not be used as a sole basis for treatment. Nasal washings and aspirates are unacceptable for Xpert Xpress SARS-CoV-2/FLU/RSV testing.  Fact Sheet for Patients: EntrepreneurPulse.com.au  Fact Sheet for Healthcare Providers: IncredibleEmployment.be  This test is not yet approved or cleared by the [place] FDA and has been authorized for detection and/or diagnosis of SARS-CoV-2 by FDA under an Emergency Use Authorization (EUA). This EUA will remain in effect (meaning this test can be used) for the duration of the COVID-19 declaration under Section 564(b)(1) of the Act, 21 U.S.C. section 360bbb-3(b)(1), unless the authorization is terminated or revoked.  Performed at [facility], [address] [place],  [address]      Total time spend on discharging this patient, including the last patient exam, discussing the hospital stay, instructions for ongoing care as it relates to all pertinent caregivers, as well as preparing the medical discharge records, prescriptions, and/or referrals as applicable, is 40 minutes.    Enzo Bi, MD  [facility] 07/03/2021, 12:48 PM

## 2021-07-03 NOTE — Progress Notes (Signed)
Chaplain attempted to visit with patient per spiritual consult request. Pt was in a deep sleep and therefore unable to meet. Chaplain expects to follow up.

## 2021-07-03 NOTE — Procedures (Signed)
PROCEDURE SUMMARY:  Successful US guided paracentesis from LLQ.  Yielded 2.5 L of clear yellow fluid.  No immediate complications.  Pt tolerated well.   Specimen was sent for labs.  EBL < 44mL  Rockney Ghee 07/03/2021 1:03 PM

## 2021-07-03 NOTE — Progress Notes (Signed)
Called report to [place] at   [phone]  going to [phone]

## 2021-07-03 NOTE — TOC Progression Note (Signed)
Transition of Care [facility]) - Progression Note    Patient Details  Name: Heather Peterson MRN: [id number] Date of Birth: 1940/11/09  [facility] CM/SW [place], RN Phone Number: 07/03/2021, 4:20 PM  Clinical Narrative:   Patient is discharging to [facility] today.  5th on EMS list at 3pm    Expected Discharge Plan: [place] Barriers to Discharge: Continued Medical Work up  Expected Discharge Plan and Services Expected Discharge Plan: [place]   Discharge Planning Services: CM Consult Post Acute Care Choice: [place] Living arrangements for the past 2 months: Single Family Home Expected Discharge Date: 07/03/21               DME Arranged: N/A DME Agency: NA       HH Arranged: NA HH Agency: NA         Social Determinants of Health (SDOH) Interventions    Readmission Risk Interventions Readmission Risk Prevention Plan 06/28/2021 02/20/2021 02/16/2021  Transportation Screening Complete Complete Complete  Medication Review Press photographer) Complete Complete Complete  PCP or Specialist appointment within 3-5 days of discharge Complete - Complete  HRI or [place] Complete Complete Complete  SW Recovery Care/Counseling Consult Complete Complete Complete  Palliative Care Screening Complete Not Applicable Not [place] Patient Refused Patient Refused Complete  Some recent data might be hidden

## 2021-07-06 LAB — BODY FLUID CULTURE W GRAM STAIN
Culture: NO GROWTH
Gram Stain: NONE SEEN

## 2021-07-09 ENCOUNTER — Other Ambulatory Visit: Payer: Self-pay | Admitting: Gastroenterology

## 2021-07-09 DIAGNOSIS — K7460 Unspecified cirrhosis of liver: Secondary | ICD-10-CM

## 2021-07-09 DIAGNOSIS — R188 Other ascites: Secondary | ICD-10-CM

## 2021-07-10 ENCOUNTER — Ambulatory Visit
Admission: RE | Admit: 2021-07-10 | Discharge: 2021-07-10 | Disposition: A | Discharge status: Home / Self Care | Payer: Medicare Other | Source: Ambulatory Visit | Attending: Gastroenterology | Admitting: Gastroenterology

## 2021-07-10 DIAGNOSIS — R188 Other ascites: Secondary | ICD-10-CM | POA: Insufficient documentation

## 2021-07-10 DIAGNOSIS — K7460 Unspecified cirrhosis of liver: Secondary | ICD-10-CM | POA: Diagnosis present

## 2021-07-10 LAB — PROTEIN, PLEURAL OR PERITONEAL FLUID: Total protein, fluid: 3.8 g/dL

## 2021-07-10 LAB — BODY FLUID CELL COUNT WITH DIFFERENTIAL
Eos, Fluid: 0 %
Lymphs, Fluid: 30 %
Monocyte-Macrophage-Serous Fluid: 61 %
Neutrophil Count, Fluid: 9 %
Total Nucleated Cell Count, Fluid: 292 cu mm

## 2021-07-10 LAB — ALBUMIN, PLEURAL OR PERITONEAL FLUID: Albumin, Fluid: 2.4 g/dL

## 2021-07-10 NOTE — Procedures (Signed)
PROCEDURE SUMMARY:  Successful US guided paracentesis from RLQ.  Yielded 2.8 L of clear yellow fluid.  No immediate complications.  Pt tolerated well.   Specimen was sent for labs.  EBL < 63mL  Rockney Ghee 07/10/2021 4:24 PM

## 2021-07-12 LAB — CYTOLOGY - NON PAP

## 2021-07-14 LAB — BODY FLUID CULTURE W GRAM STAIN
Culture: NO GROWTH
Gram Stain: NONE SEEN

## 2021-07-29 NOTE — Progress Notes (Signed)
?[place]  ?[phone] [phone] ? ?ID: Heather Peterson OB: 1940-07-01  MR#: [id number]  HQP#:[id number] ? ?Patient Care Team: ?[name], MD as PCP - General (Family Medicine) ?Lloyd Huger, MD as Consulting Physician (Oncology) ? ?CHIEF COMPLAINT: Multiple myeloma, subdural hematoma. ? ?INTERVAL HISTORY: Patient returns to clinic today for repeat laboratory work, hospital evaluation, and consideration of blood transfusion if needed.  She continues to have increased weakness and fatigue and a slow recovery from her recent hospitalization for broken tibia and fibula.  Patient does admit her ambulation is improving.  She continues to have chronic, recurrent ascites secondary to her cirrhosis and has a paracentesis scheduled later this week.  She does not complain of any neuropathy today.  She has no neurologic complaints.  She has chronic shortness of breath, but is not wearing oxygen today. She denies any fevers.  She has a fair appetite.  She denies any chest pain, cough, or hemoptysis. She denies any nausea, vomiting, constipation or diarrhea.  She has no further abdominal pain.  She denies any melena or hematochezia.  She has no urinary complaints.  Patient feels generally terrible, but offers no further specific complaints today. ? ?REVIEW OF SYSTEMS:   ?Review of Systems  ?Constitutional:  Positive for malaise/fatigue. Negative for fever.  ?Respiratory:  Positive for shortness of breath. Negative for cough and hemoptysis.   ?Cardiovascular:  Positive for leg swelling. Negative for chest pain.  ?Gastrointestinal: Negative.  Negative for abdominal pain, constipation, diarrhea, melena and nausea.  ?     Abdominal swelling secondary to ascites.  ?Genitourinary: Negative.  Negative for dysuria and hematuria.  ?Musculoskeletal:  Positive for back pain. Negative for joint pain.  ?Skin: Negative.  Negative for rash.  ?Neurological:  Positive for weakness.  Negative for dizziness, sensory change, focal weakness and headaches.  ?Psychiatric/Behavioral: Negative.  The patient is not nervous/anxious.   ? ?As per HPI. Otherwise, a complete review of systems is negative. ? ?PAST MEDICAL HISTORY: ?Past Medical History:  ?Diagnosis Date  ? Anxiety   ? Chronic combined systolic and diastolic CHF (congestive heart failure) ([place])   ? Chronic kidney disease   ? Coronary artery disease   ? Depression   ? Diabetes mellitus without complication ([place])   ? Diabetes mellitus, type II ([place])   ? Hypertension   ? MI (myocardial infarction) ([place])   ? x 5  ? Pacemaker   ? 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[place])   ? Prolonged Q-T interval on ECG   ? Thyroid disease   ? ? ?PAST SURGICAL HISTORY: ?Past Surgical History:  ?Procedure Laterality Date  ? CENTRAL LINE INSERTION  03/11/2017  ? Procedure: CENTRAL LINE INSERTION;  Surgeon: Leonie Man, MD;  Location: [place] [facility];  Service: Cardiovascular;;  ? CHOLECYSTECTOMY    ? COLONOSCOPY WITH PROPOFOL N/A 09/01/2019  ? Procedure: COLONOSCOPY WITH PROPOFOL;  Surgeon: Toledo, Benay Pike, MD;  Location: [facility];  Service: Gastroenterology;  Laterality: N/A;  ? CORONARY STENT INTERVENTION W/IMPELLA N/A 03/11/2017  ? Procedure: Coronary Stent Intervention w/Impella;  Surgeon: Leonie Man, MD;  Location: [place] [facility];  Service: Cardiovascular;  Laterality: N/A;  ? ESOPHAGOGASTRODUODENOSCOPY (EGD) WITH PROPOFOL N/A 09/01/2019  ? Procedure: ESOPHAGOGASTRODUODENOSCOPY (EGD) WITH PROPOFOL;  Surgeon: Toledo, Benay Pike, MD;  Location: [facility];  Service: Gastroenterology;  Laterality: N/A;  ? ESOPHAGOGASTRODUODENOSCOPY (EGD) WITH PROPOFOL N/A 09/08/2019  ? Procedure: ESOPHAGOGASTRODUODENOSCOPY (EGD) WITH PROPOFOL;  Surgeon: [name], MD;  Location: [facility];  Service: Gastroenterology;  Laterality: N/A;  ? ESOPHAGOGASTRODUODENOSCOPY (EGD) WITH PROPOFOL N/A 02/15/2021  ? Procedure: ESOPHAGOGASTRODUODENOSCOPY (EGD) WITH  PROPOFOL;  Surgeon: [name], MD;  Location[facility] END[facility]COPY;  Service: Gastroenterology;  Laterality: N/A;  ? EYE SURGERY    ? HIP ARTHROPLASTY Right 03/29/2020  ? Procedure: ARTHROPLASTY BIPOLAR HIP (HEMIARTHROPLASTY);  Surgeon: Corky Mull, MD;  Location: [facility] ORS;  Service: Orthopedics;  Laterality: Right;  ? INTRAVASCULAR PRESSURE WIRE/FFR STUDY N/A 03/11/2017  ? Procedure: INTRAVASCULAR PRESSURE WIRE/FFR STUDY;  Surgeon: Leonie Man, MD;  Location: [place] [facility];  Service: Cardiovascular;  Laterality: N/A;  ? INTRAVASCULAR ULTRASOUND/IVUS N/A 03/11/2017  ? Procedure: Intravascular Ultrasound/IVUS;  Surgeon: Leonie Man, MD;  Location: [place] [facility];  Service: Cardiovascular;  Laterality: N/A;  ? LEFT HEART CATH AND CORONARY ANGIOGRAPHY N/A 03/05/2017  ? Procedure: LEFT HEART CATH AND CORONARY ANGIOGRAPHY;  Surgeon: Isaias Cowman, MD;  Location: [place] [facility];  Service: Cardiovascular;  Laterality: N/A;  ? PACEMAKER IMPLANT    ? pacemaker/defibrillator Left   ? ? ?FAMILY HISTORY: ?Family History  ?Problem Relation Age of Onset  ? Hypertension Father   ? Heart attack Father   ? Depression Sister   ? Depression Brother   ? Depression Brother   ? ? ?ADVANCED DIRECTIVES (Y/N):  N ? ?HEALTH MAINTENANCE: ?Social History  ? ?Tobacco Use  ? Smoking status: Every Day  ?  Packs/day: 0.25  ?  Types: E-cigarettes, Cigarettes  ? Smokeless tobacco: Never  ?Vaping Use  ? Vaping Use: Former  ?Substance Use Topics  ? Alcohol use: Not Currently  ?  Comment: occasionally  ? Drug use: Yes  ?  Comment: prescribed pain meds  ? ? ? Colonoscopy: ? PAP: ? Bone density: ? Lipid panel: ? ?Allergies  ?Allergen Reactions  ? Celebrex [Celecoxib] Anaphylaxis  ? Glipizide Anaphylaxis  ? Levaquin [Levofloxacin In D5w] Other (See Comments)  ?  Heart arrhthymias  ? Levofloxacin Other (See Comments) and Palpitations  ?  ICD fired  ? Lisinopril Swelling  ?  Lip and facial swelling  ? Sulfa Antibiotics Other  (See Comments) and Anaphylaxis  ?  Reaction: unknown  ? Metformin Other (See Comments)  ?  Gi tolerance   ? Penicillins Rash and Other (See Comments)  ?  Has patient had a PCN reaction causing immediate rash, facial/tongue/throat swelling, SOB or lightheadedness with hypotension: Unknown ?Has patient had a PCN reaction causing severe rash involving mucus membranes or skin necrosis: No ?Has patient had a PCN reaction that required hospitalization: No ?Has patient had a PCN reaction occurring within the last 10 years: No ?If all of the above answers are "NO", then may proceed with Cephalosporin use. ?  ? ? ?Current Outpatient Medications  ?Medication Sig Dispense Refill  ? albumin human 25 % bottle Inject 100 mLs (25 g total) into the vein as needed (Give with paracentesis).    ? allopurinol (ZYLOPRIM) 300 MG tablet Take 300 mg by mouth daily.    ? aspirin EC 81 MG tablet Take 81 mg by mouth at bedtime.     ? colchicine 0.6 MG tablet Take by mouth.    ? docusate sodium (COLACE) 100 MG capsule Take 100 mg by mouth 2 (two) times daily.    ? doxycycline (VIBRA-TABS) 100 MG tablet Take 1 tablet (100 mg total) by mouth daily. For SBP ppx.    ? Ensure Max Protein (ENSURE MAX PROTEIN) LIQD Take 330 mLs (  11 oz total) by mouth 2 (two) times daily between meals.    ? fexofenadine (ALLEGRA) 180 MG tablet Take 180 mg by mouth daily.    ? FLUoxetine (PROZAC) 10 MG capsule Take 10 mg by mouth daily.    ? gabapentin (NEURONTIN) 600 MG tablet Take 600 mg by mouth daily.    ? hydrOXYzine (ATARAX) 25 MG tablet Take 1 tablet (25 mg total) by mouth every 8 (eight) hours as needed. 90 tablet 0  ? levothyroxine (SYNTHROID) 50 MCG tablet Take 50 mcg by mouth daily before breakfast.    ? levothyroxine (SYNTHROID) 75 MCG tablet Take 1 tablet (75 mcg total) by mouth daily before breakfast. On Monday, Wednesday and Friday    ? lidocaine (LIDODERM) 5 % Place 1 patch onto the skin every 12 (twelve) hours. Remove & Discard patch within 12 hours or  as directed by MD 15 patch 0  ? midodrine (PROAMATINE) 10 MG tablet Take 10 mg by mouth 3 (three) times daily.    ? morphine (MSIR) 15 MG tablet Take 1 tablet (15 mg total) by mouth every 6 (six) hours as ne

## 2021-07-31 ENCOUNTER — Other Ambulatory Visit: Payer: Self-pay | Admitting: Gastroenterology

## 2021-07-31 DIAGNOSIS — K7460 Unspecified cirrhosis of liver: Secondary | ICD-10-CM

## 2021-08-01 ENCOUNTER — Other Ambulatory Visit: Payer: Self-pay

## 2021-08-01 ENCOUNTER — Inpatient Hospital Stay: Payer: Medicare Other | Attending: Oncology

## 2021-08-01 ENCOUNTER — Inpatient Hospital Stay (HOSPITAL_BASED_OUTPATIENT_CLINIC_OR_DEPARTMENT_OTHER): Payer: Medicare Other | Admitting: Oncology

## 2021-08-01 VITALS — BP 104/71 | HR 88 | Temp 97.9°F | Resp 16 | Ht 63.0 in

## 2021-08-01 DIAGNOSIS — C9000 Multiple myeloma not having achieved remission: Secondary | ICD-10-CM

## 2021-08-01 DIAGNOSIS — Z950 Presence of cardiac pacemaker: Secondary | ICD-10-CM | POA: Diagnosis not present

## 2021-08-01 DIAGNOSIS — R188 Other ascites: Secondary | ICD-10-CM | POA: Diagnosis not present

## 2021-08-01 DIAGNOSIS — D649 Anemia, unspecified: Secondary | ICD-10-CM | POA: Diagnosis not present

## 2021-08-01 DIAGNOSIS — Z79899 Other long term (current) drug therapy: Secondary | ICD-10-CM | POA: Diagnosis not present

## 2021-08-01 DIAGNOSIS — G629 Polyneuropathy, unspecified: Secondary | ICD-10-CM | POA: Diagnosis not present

## 2021-08-01 DIAGNOSIS — N189 Chronic kidney disease, unspecified: Secondary | ICD-10-CM | POA: Insufficient documentation

## 2021-08-01 LAB — CBC WITH DIFFERENTIAL/PLATELET
Abs Immature Granulocytes: 0.1 10*3/uL — ABNORMAL HIGH (ref 0.00–0.07)
Basophils Absolute: 0.1 10*3/uL (ref 0.0–0.1)
Basophils Relative: 1 %
Eosinophils Absolute: 0.2 10*3/uL (ref 0.0–0.5)
Eosinophils Relative: 2 %
HCT: 31.7 % — ABNORMAL LOW (ref 36.0–46.0)
Hemoglobin: 9.8 g/dL — ABNORMAL LOW (ref 12.0–15.0)
Immature Granulocytes: 1 %
Lymphocytes Relative: 15 %
Lymphs Abs: 1.4 10*3/uL (ref 0.7–4.0)
MCH: 30.1 pg (ref 26.0–34.0)
MCHC: 30.9 g/dL (ref 30.0–36.0)
MCV: 97.2 fL (ref 80.0–100.0)
Monocytes Absolute: 0.9 10*3/uL (ref 0.1–1.0)
Monocytes Relative: 10 %
Neutro Abs: 6.5 10*3/uL (ref 1.7–7.7)
Neutrophils Relative %: 71 %
Platelets: 232 10*3/uL (ref 150–400)
RBC: 3.26 MIL/uL — ABNORMAL LOW (ref 3.87–5.11)
RDW: 22.6 % — ABNORMAL HIGH (ref 11.5–15.5)
WBC: 9.1 10*3/uL (ref 4.0–10.5)
nRBC: 0.2 % (ref 0.0–0.2)

## 2021-08-01 LAB — COMPREHENSIVE METABOLIC PANEL
ALT: 8 U/L (ref 0–44)
AST: 17 U/L (ref 15–41)
Albumin: 3.2 g/dL — ABNORMAL LOW (ref 3.5–5.0)
Alkaline Phosphatase: 117 U/L (ref 38–126)
Anion gap: 11 (ref 5–15)
BUN: 44 mg/dL — ABNORMAL HIGH (ref 8–23)
CO2: 24 mmol/L (ref 22–32)
Calcium: 8.9 mg/dL (ref 8.9–10.3)
Chloride: 100 mmol/L (ref 98–111)
Creatinine, Ser: 2.67 mg/dL — ABNORMAL HIGH (ref 0.44–1.00)
GFR, Estimated: 18 mL/min — ABNORMAL LOW (ref >60)
Glucose, Bld: 95 mg/dL (ref 70–99)
Potassium: 4 mmol/L (ref 3.5–5.1)
Sodium: 135 mmol/L (ref 135–145)
Total Bilirubin: 0.6 mg/dL (ref 0.3–1.2)
Total Protein: 6.7 g/dL (ref 6.5–8.1)

## 2021-08-01 NOTE — Progress Notes (Signed)
Pt fell on 2/14 and fractured left lower leg. Swelling present to b/l feet. Son asking for refill on hydroxyzine. Reports difficulty chewing and swallowing solids. Nauseous when standing, denies dizziness. ?

## 2021-08-02 ENCOUNTER — Inpatient Hospital Stay: Payer: Medicare Other

## 2021-08-02 ENCOUNTER — Encounter: Payer: Self-pay | Admitting: Oncology

## 2021-08-02 LAB — IGG, IGA, IGM
IgA: 1640 mg/dL — ABNORMAL HIGH (ref 64–422)
IgG (Immunoglobin G), Serum: 122 mg/dL — ABNORMAL LOW (ref 586–1602)
IgM (Immunoglobulin M), Srm: <5 mg/dL — ABNORMAL LOW (ref 26–217)

## 2021-08-02 MED ORDER — HYDROXYZINE HCL 25 MG PO TABS: 25.0000 mg | ORAL_TABLET | Freq: Three times a day (TID) | ORAL | 0 refills | Status: AC | PRN

## 2021-08-02 NOTE — Progress Notes (Signed)
Called patient 08/02/21 @ 1:55 pm. No answer. Left VM to arrive at 2:00 for 2:30 procedure.  ?

## 2021-08-03 ENCOUNTER — Ambulatory Visit
Admission: RE | Admit: 2021-08-03 | Discharge: 2021-08-03 | Disposition: A | Discharge status: Home / Self Care | Payer: Medicare Other | Source: Ambulatory Visit | Attending: Gastroenterology | Admitting: Gastroenterology

## 2021-08-03 ENCOUNTER — Other Ambulatory Visit: Payer: Self-pay

## 2021-08-03 DIAGNOSIS — K7460 Unspecified cirrhosis of liver: Secondary | ICD-10-CM | POA: Diagnosis not present

## 2021-08-03 DIAGNOSIS — R188 Other ascites: Secondary | ICD-10-CM | POA: Diagnosis present

## 2021-08-03 LAB — BODY FLUID CELL COUNT WITH DIFFERENTIAL
Eos, Fluid: 0 %
Lymphs, Fluid: 39 %
Monocyte-Macrophage-Serous Fluid: 7 %
Neutrophil Count, Fluid: 54 %
Total Nucleated Cell Count, Fluid: 50 cu mm

## 2021-08-03 LAB — PROTEIN ELECTROPHORESIS, SERUM
A/G Ratio: 1.1 (ref 0.7–1.7)
Albumin ELP: 3.3 g/dL (ref 2.9–4.4)
Alpha-1-Globulin: 0.3 g/dL (ref 0.0–0.4)
Alpha-2-Globulin: 0.6 g/dL (ref 0.4–1.0)
Beta Globulin: 1.6 g/dL — ABNORMAL HIGH (ref 0.7–1.3)
Gamma Globulin: 0.4 g/dL (ref 0.4–1.8)
Globulin, Total: 2.9 g/dL (ref 2.2–3.9)
M-Spike, %: 0.9 g/dL — ABNORMAL HIGH
Total Protein ELP: 6.2 g/dL (ref 6.0–8.5)

## 2021-08-03 LAB — PROTEIN, PLEURAL OR PERITONEAL FLUID: Total protein, fluid: 3.8 g/dL

## 2021-08-03 LAB — ALBUMIN, PLEURAL OR PERITONEAL FLUID: Albumin, Fluid: 2.2 g/dL

## 2021-08-03 MED ORDER — ALBUMIN HUMAN 25 % IV SOLN
INTRAVENOUS | Status: AC
  Administered 2021-08-03: 25 g via INTRAVENOUS
  Filled 2021-08-03: qty 100

## 2021-08-03 MED ORDER — ALBUMIN HUMAN 25 % IV SOLN: 25.0000 g | Freq: Once | INTRAVENOUS | Status: AC

## 2021-08-03 NOTE — Progress Notes (Signed)
Medium cuff ?

## 2021-08-03 NOTE — Procedures (Signed)
PROCEDURE SUMMARY: ? ?Successful ultrasound guided paracentesis from the right lower quadrant.  ?Yielded 5.5 L of clear yellow fluid.  ?No immediate complications.  ?The patient tolerated the procedure well.  ? ?Specimen sent for labs. ? ?EBL < 48m ? ?The patient has required >/=2 paracenteses in a 30 day period and a formal evaluation by the [facility] has been arranged. ? ? ?JSoyla Dryer AGACNP-BC ?[phone]?08/03/2021, 3:31 PM ? ? ? ?

## 2021-08-04 LAB — SAMPLE TO BLOOD BANK

## 2021-08-07 LAB — CYTOLOGY - NON PAP

## 2021-08-08 ENCOUNTER — Inpatient Hospital Stay: Payer: Medicare Other

## 2021-08-08 ENCOUNTER — Emergency Department: Payer: Medicare Other

## 2021-08-08 ENCOUNTER — Other Ambulatory Visit: Payer: Self-pay

## 2021-08-08 ENCOUNTER — Inpatient Hospital Stay
Admission: EM | Admit: 2021-08-08 | Discharge: 2021-08-11 | DRG: 083 | Disposition: E | Payer: Medicare Other | Attending: Internal Medicine | Admitting: Internal Medicine

## 2021-08-08 DIAGNOSIS — R778 Other specified abnormalities of plasma proteins: Secondary | ICD-10-CM

## 2021-08-08 DIAGNOSIS — E1122 Type 2 diabetes mellitus with diabetic chronic kidney disease: Secondary | ICD-10-CM | POA: Diagnosis present

## 2021-08-08 DIAGNOSIS — Z79899 Other long term (current) drug therapy: Secondary | ICD-10-CM

## 2021-08-08 DIAGNOSIS — I2510 Atherosclerotic heart disease of native coronary artery without angina pectoris: Secondary | ICD-10-CM | POA: Diagnosis present

## 2021-08-08 DIAGNOSIS — F17290 Nicotine dependence, other tobacco product, uncomplicated: Secondary | ICD-10-CM | POA: Diagnosis present

## 2021-08-08 DIAGNOSIS — N183 Chronic kidney disease, stage 3 unspecified: Secondary | ICD-10-CM | POA: Diagnosis not present

## 2021-08-08 DIAGNOSIS — I130 Hypertensive heart and chronic kidney disease with heart failure and stage 1 through stage 4 chronic kidney disease, or unspecified chronic kidney disease: Secondary | ICD-10-CM | POA: Diagnosis present

## 2021-08-08 DIAGNOSIS — Z9981 Dependence on supplemental oxygen: Secondary | ICD-10-CM

## 2021-08-08 DIAGNOSIS — F17210 Nicotine dependence, cigarettes, uncomplicated: Secondary | ICD-10-CM | POA: Diagnosis present

## 2021-08-08 DIAGNOSIS — W010XXA Fall on same level from slipping, tripping and stumbling without subsequent striking against object, initial encounter: Secondary | ICD-10-CM | POA: Diagnosis present

## 2021-08-08 DIAGNOSIS — N184 Chronic kidney disease, stage 4 (severe): Secondary | ICD-10-CM | POA: Diagnosis present

## 2021-08-08 DIAGNOSIS — E785 Hyperlipidemia, unspecified: Secondary | ICD-10-CM | POA: Diagnosis present

## 2021-08-08 DIAGNOSIS — S065X9A Traumatic subdural hemorrhage with loss of consciousness of unspecified duration, initial encounter: Secondary | ICD-10-CM | POA: Diagnosis not present

## 2021-08-08 DIAGNOSIS — Z9221 Personal history of antineoplastic chemotherapy: Secondary | ICD-10-CM

## 2021-08-08 DIAGNOSIS — Z7982 Long term (current) use of aspirin: Secondary | ICD-10-CM

## 2021-08-08 DIAGNOSIS — W19XXXA Unspecified fall, initial encounter: Secondary | ICD-10-CM | POA: Diagnosis not present

## 2021-08-08 DIAGNOSIS — R7989 Other specified abnormal findings of blood chemistry: Secondary | ICD-10-CM

## 2021-08-08 DIAGNOSIS — E1129 Type 2 diabetes mellitus with other diabetic kidney complication: Secondary | ICD-10-CM | POA: Diagnosis present

## 2021-08-08 DIAGNOSIS — R55 Syncope and collapse: Secondary | ICD-10-CM | POA: Diagnosis not present

## 2021-08-08 DIAGNOSIS — S51811A Laceration without foreign body of right forearm, initial encounter: Secondary | ICD-10-CM | POA: Diagnosis present

## 2021-08-08 DIAGNOSIS — S065XAA Traumatic subdural hemorrhage with loss of consciousness status unknown, initial encounter: Secondary | ICD-10-CM | POA: Diagnosis not present

## 2021-08-08 DIAGNOSIS — J449 Chronic obstructive pulmonary disease, unspecified: Secondary | ICD-10-CM | POA: Diagnosis not present

## 2021-08-08 DIAGNOSIS — I252 Old myocardial infarction: Secondary | ICD-10-CM

## 2021-08-08 DIAGNOSIS — I340 Nonrheumatic mitral (valve) insufficiency: Secondary | ICD-10-CM | POA: Diagnosis present

## 2021-08-08 DIAGNOSIS — J439 Emphysema, unspecified: Secondary | ICD-10-CM | POA: Diagnosis present

## 2021-08-08 DIAGNOSIS — I10 Essential (primary) hypertension: Secondary | ICD-10-CM | POA: Diagnosis not present

## 2021-08-08 DIAGNOSIS — Z8249 Family history of ischemic heart disease and other diseases of the circulatory system: Secondary | ICD-10-CM | POA: Diagnosis not present

## 2021-08-08 DIAGNOSIS — F418 Other specified anxiety disorders: Secondary | ICD-10-CM | POA: Diagnosis present

## 2021-08-08 DIAGNOSIS — I5042 Chronic combined systolic (congestive) and diastolic (congestive) heart failure: Secondary | ICD-10-CM | POA: Diagnosis present

## 2021-08-08 DIAGNOSIS — Z95810 Presence of automatic (implantable) cardiac defibrillator: Secondary | ICD-10-CM

## 2021-08-08 DIAGNOSIS — K7460 Unspecified cirrhosis of liver: Secondary | ICD-10-CM | POA: Diagnosis present

## 2021-08-08 DIAGNOSIS — Y92002 Bathroom of unspecified non-institutional (private) residence single-family (private) house as the place of occurrence of the external cause: Secondary | ICD-10-CM

## 2021-08-08 DIAGNOSIS — Z818 Family history of other mental and behavioral disorders: Secondary | ICD-10-CM

## 2021-08-08 DIAGNOSIS — J9611 Chronic respiratory failure with hypoxia: Secondary | ICD-10-CM | POA: Diagnosis present

## 2021-08-08 DIAGNOSIS — E039 Hypothyroidism, unspecified: Secondary | ICD-10-CM | POA: Diagnosis present

## 2021-08-08 DIAGNOSIS — D631 Anemia in chronic kidney disease: Secondary | ICD-10-CM | POA: Diagnosis present

## 2021-08-08 DIAGNOSIS — S81811A Laceration without foreign body, right lower leg, initial encounter: Principal | ICD-10-CM

## 2021-08-08 DIAGNOSIS — Z96641 Presence of right artificial hip joint: Secondary | ICD-10-CM | POA: Diagnosis present

## 2021-08-08 DIAGNOSIS — Z66 Do not resuscitate: Secondary | ICD-10-CM | POA: Diagnosis present

## 2021-08-08 DIAGNOSIS — Z515 Encounter for palliative care: Secondary | ICD-10-CM

## 2021-08-08 DIAGNOSIS — I472 Ventricular tachycardia, unspecified: Secondary | ICD-10-CM | POA: Diagnosis present

## 2021-08-08 DIAGNOSIS — C9000 Multiple myeloma not having achieved remission: Secondary | ICD-10-CM | POA: Diagnosis present

## 2021-08-08 DIAGNOSIS — F17200 Nicotine dependence, unspecified, uncomplicated: Secondary | ICD-10-CM | POA: Diagnosis present

## 2021-08-08 DIAGNOSIS — Z79890 Hormone replacement therapy (postmenopausal): Secondary | ICD-10-CM

## 2021-08-08 DIAGNOSIS — I5022 Chronic systolic (congestive) heart failure: Secondary | ICD-10-CM | POA: Diagnosis present

## 2021-08-08 LAB — COMPREHENSIVE METABOLIC PANEL
ALT: 8 U/L (ref 0–44)
AST: 25 U/L (ref 15–41)
Albumin: 3.2 g/dL — ABNORMAL LOW (ref 3.5–5.0)
Alkaline Phosphatase: 88 U/L (ref 38–126)
Anion gap: 8 (ref 5–15)
BUN: 55 mg/dL — ABNORMAL HIGH (ref 8–23)
CO2: 27 mmol/L (ref 22–32)
Calcium: 9.1 mg/dL (ref 8.9–10.3)
Chloride: 100 mmol/L (ref 98–111)
Creatinine, Ser: 2.3 mg/dL — ABNORMAL HIGH (ref 0.44–1.00)
GFR, Estimated: 21 mL/min — ABNORMAL LOW (ref >60)
Glucose, Bld: 114 mg/dL — ABNORMAL HIGH (ref 70–99)
Potassium: 4.5 mmol/L (ref 3.5–5.1)
Sodium: 135 mmol/L (ref 135–145)
Total Bilirubin: 0.9 mg/dL (ref 0.3–1.2)
Total Protein: 6.3 g/dL — ABNORMAL LOW (ref 6.5–8.1)

## 2021-08-08 LAB — URINALYSIS, COMPLETE (UACMP) WITH MICROSCOPIC
Bilirubin Urine: NEGATIVE
Glucose, UA: NEGATIVE mg/dL
Hgb urine dipstick: NEGATIVE
Ketones, ur: NEGATIVE mg/dL
Nitrite: NEGATIVE
Protein, ur: NEGATIVE mg/dL
Specific Gravity, Urine: 1.014 (ref 1.005–1.030)
pH: 5 (ref 5.0–8.0)

## 2021-08-08 LAB — AEROBIC/ANAEROBIC CULTURE W GRAM STAIN (SURGICAL/DEEP WOUND)
Culture: NO GROWTH
Gram Stain: NONE SEEN

## 2021-08-08 LAB — AMMONIA: Ammonia: 20 umol/L (ref 9–35)

## 2021-08-08 LAB — CBC
HCT: 32 % — ABNORMAL LOW (ref 36.0–46.0)
Hemoglobin: 9.7 g/dL — ABNORMAL LOW (ref 12.0–15.0)
MCH: 29.9 pg (ref 26.0–34.0)
MCHC: 30.3 g/dL (ref 30.0–36.0)
MCV: 98.8 fL (ref 80.0–100.0)
Platelets: 175 10*3/uL (ref 150–400)
RBC: 3.24 MIL/uL — ABNORMAL LOW (ref 3.87–5.11)
RDW: 22.3 % — ABNORMAL HIGH (ref 11.5–15.5)
WBC: 8.1 10*3/uL (ref 4.0–10.5)
nRBC: 0.2 % (ref 0.0–0.2)

## 2021-08-08 LAB — APTT: aPTT: 34 seconds (ref 24–36)

## 2021-08-08 LAB — PROTIME-INR
INR: 1.3 — ABNORMAL HIGH (ref 0.8–1.2)
Prothrombin Time: 16.1 seconds — ABNORMAL HIGH (ref 11.4–15.2)

## 2021-08-08 LAB — TROPONIN I (HIGH SENSITIVITY)
Troponin I (High Sensitivity): 23 ng/L — ABNORMAL HIGH (ref <18)
Troponin I (High Sensitivity): 23 ng/L — ABNORMAL HIGH (ref <18)

## 2021-08-08 LAB — MRSA NEXT GEN BY PCR, NASAL: MRSA by PCR Next Gen: NOT DETECTED

## 2021-08-08 LAB — MAGNESIUM: Magnesium: 2.4 mg/dL (ref 1.7–2.4)

## 2021-08-08 LAB — BRAIN NATRIURETIC PEPTIDE: B Natriuretic Peptide: 831.6 pg/mL — ABNORMAL HIGH (ref 0.0–100.0)

## 2021-08-08 MED ORDER — MEDIHONEY WOUND/BURN DRESSING EX PSTE
1.0000 "application " | PASTE | Freq: Every day | CUTANEOUS | Status: DC
  Filled 2021-08-08: qty 44

## 2021-08-08 MED ORDER — HYDRALAZINE HCL 20 MG/ML IJ SOLN: 5.0000 mg | INTRAMUSCULAR | Status: DC | PRN

## 2021-08-08 MED ORDER — NICOTINE 14 MG/24HR TD PT24
14.0000 mg | MEDICATED_PATCH | Freq: Every day | TRANSDERMAL | Status: DC
  Administered 2021-08-08: 14 mg via TRANSDERMAL
  Filled 2021-08-08: qty 1

## 2021-08-08 MED ORDER — HYDROCODONE-ACETAMINOPHEN 5-325 MG PO TABS
1.0000 | ORAL_TABLET | Freq: Four times a day (QID) | ORAL | Status: DC | PRN
  Administered 2021-08-08: 1 via ORAL
  Filled 2021-08-08: qty 1

## 2021-08-08 MED ORDER — MIDODRINE HCL 5 MG PO TABS
5.0000 mg | ORAL_TABLET | Freq: Three times a day (TID) | ORAL | Status: DC
  Administered 2021-08-08: 5 mg via ORAL
  Filled 2021-08-08: qty 1

## 2021-08-08 MED ORDER — TRAZODONE HCL 50 MG PO TABS
50.0000 mg | ORAL_TABLET | Freq: Every day | ORAL | Status: DC
  Filled 2021-08-08: qty 1

## 2021-08-08 MED ORDER — DOXYCYCLINE HYCLATE 100 MG PO TABS: 100.0000 mg | ORAL_TABLET | Freq: Every day | ORAL | Status: DC

## 2021-08-08 MED ORDER — TORSEMIDE 20 MG PO TABS
20.0000 mg | ORAL_TABLET | Freq: Every day | ORAL | Status: DC
  Filled 2021-08-08: qty 1

## 2021-08-08 MED ORDER — MEXILETINE HCL 200 MG PO CAPS
200.0000 mg | ORAL_CAPSULE | Freq: Two times a day (BID) | ORAL | Status: DC
  Administered 2021-08-08: 200 mg via ORAL
  Filled 2021-08-08 (×2): qty 1

## 2021-08-08 MED ORDER — FLUOXETINE HCL 10 MG PO CAPS
10.0000 mg | ORAL_CAPSULE | Freq: Every day | ORAL | Status: DC
  Filled 2021-08-08 (×3): qty 1

## 2021-08-08 MED ORDER — HYDROXYZINE HCL 25 MG PO TABS: 25.0000 mg | ORAL_TABLET | Freq: Three times a day (TID) | ORAL | Status: DC | PRN

## 2021-08-08 MED ORDER — GABAPENTIN 600 MG PO TABS
600.0000 mg | ORAL_TABLET | Freq: Every day | ORAL | Status: DC
  Administered 2021-08-08: 600 mg via ORAL
  Filled 2021-08-08: qty 1

## 2021-08-08 MED ORDER — LACTULOSE 10 GM/15ML PO SOLN
20.0000 g | Freq: Two times a day (BID) | ORAL | Status: DC
  Filled 2021-08-08: qty 30

## 2021-08-08 MED ORDER — SIMVASTATIN 20 MG PO TABS
20.0000 mg | ORAL_TABLET | Freq: Every day | ORAL | Status: DC
  Administered 2021-08-08: 20 mg via ORAL
  Filled 2021-08-08: qty 1

## 2021-08-08 MED ORDER — SODIUM CHLORIDE 0.9 % IV BOLUS
500.0000 mL | Freq: Once | INTRAVENOUS | Status: AC
  Administered 2021-08-08: 500 mL via INTRAVENOUS

## 2021-08-08 MED ORDER — LEVOTHYROXINE SODIUM 50 MCG PO TABS: 75.0000 ug | ORAL_TABLET | ORAL | Status: DC

## 2021-08-08 MED ORDER — ALBUTEROL SULFATE (2.5 MG/3ML) 0.083% IN NEBU: 2.5000 mg | INHALATION_SOLUTION | RESPIRATORY_TRACT | Status: DC | PRN

## 2021-08-08 MED ORDER — PANTOPRAZOLE SODIUM 40 MG PO TBEC: 40.0000 mg | DELAYED_RELEASE_TABLET | Freq: Every day | ORAL | Status: DC | PRN

## 2021-08-08 MED ORDER — LEVOTHYROXINE SODIUM 50 MCG PO TABS: 50.0000 ug | ORAL_TABLET | ORAL | Status: DC

## 2021-08-08 MED ORDER — GLYCOPYRROLATE 0.2 MG/ML IJ SOLN
0.2000 mg | INTRAMUSCULAR | Status: DC | PRN
  Administered 2021-08-09: 0.2 mg via INTRAVENOUS
  Filled 2021-08-08: qty 1

## 2021-08-08 MED ORDER — CHLORHEXIDINE GLUCONATE CLOTH 2 % EX PADS
6.0000 | MEDICATED_PAD | Freq: Every day | CUTANEOUS | Status: DC
  Administered 2021-08-08: 6 via TOPICAL

## 2021-08-08 MED ORDER — MORPHINE SULFATE (PF) 2 MG/ML IV SOLN: 2.0000 mg | INTRAVENOUS | Status: DC | PRN

## 2021-08-08 MED ORDER — LORATADINE 10 MG PO TABS: 10.0000 mg | ORAL_TABLET | Freq: Every day | ORAL | Status: DC

## 2021-08-08 MED ORDER — ALLOPURINOL 100 MG PO TABS
100.0000 mg | ORAL_TABLET | Freq: Every day | ORAL | Status: DC
  Administered 2021-08-08: 100 mg via ORAL
  Filled 2021-08-08 (×3): qty 1

## 2021-08-08 MED ORDER — ACETAMINOPHEN 325 MG PO TABS
650.0000 mg | ORAL_TABLET | Freq: Four times a day (QID) | ORAL | Status: DC | PRN
Start: 2021-08-08 — End: 2021-08-09
  Administered 2021-08-08: 650 mg via ORAL
  Filled 2021-08-08: qty 2

## 2021-08-08 NOTE — H&P (Addendum)
?History and Physical  ? ? ?Heather Peterson RFF:[id number] DOB: 1941/01/03 DOA: 07/12/2021 ? ?Referring MD/NP/PA:  ? ?PCP: [name], MD  ? ?Patient coming from:  The patient is coming from home.  At baseline, pt is independent for most of ADL.       ? ?Chief Complaint: fall ? ?HPI: Heather Peterson is a 81 y.o. female with medical history significant of hypertension, hyperlipidemia, hypothyroidism, gout, depression with anxiety, V. 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 ? ?Patient was recently hospitalized from 2/15 - 2/21 due to multiple issues, including fall, left fibula fracture and SBP. Pt had rehab, and was discharged home 2 weeks ago.  Per her daughter, patient tripped and fell in the early morning when she was going to the bathroom. Pt lost consciousness for a while.  No new unilateral numbness or tinglings in extremities.  No facial droop or slurred speech.  Patient complains of headache, neck pain, worsening left leg pain, and right hip pain.  Patient has skin laceration in right elbow area. Patient has mild dry cough and mild shortness of breath, denies chest pain.  No fever or chills.  No nausea, vomiting, diarrhea or abdominal pain.  No symptoms of UTI. ? ?Data Reviewed and ED Course: pt was found to have WBC 8.1, INR 1.3, troponin level 23, ammonia level 20, stable renal function, temperature normal, blood pressure 94/57, heart rate 83, RR 20, oxygen saturation 93-100% on 2 L oxygen, chest x-ray negative.  CT of C-spine negative for acute injury but showed degenerative disc disease.  CT of the head showed bilateral small subdural  hematoma with left to right midline shift 2 mm.  Patient is admitted to PCU as inpatient.  Dr. Cari Caraway for neurosurgery is consulted. ? ?CT-head and C spine: ?1. Bilateral acute on subacute subdural hematomas, left greater than right. ?2. Mild left-to-right midline shift measuring 2 mm. ?3. 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 ?4. No evidence for cervical spine fracture or subluxation. ?5. Mild cervical degenerative disc disease. ?6. Small right pleural effusion and mild interstitial edema. ?7. Aortic Atherosclerosis (ICD10-I70.0) and Emphysema (ICD10-J43.9).  ? ?EKG: I have personally reviewed.  Sinus rhythm, QTc 554, RAD, low voltage. ? ? ?Review of Systems:  ? ?General: no fevers, chills, no body weight gain, has fatigue ?HEENT: no blurry vision, hearing changes or sore throat ?Respiratory: has dyspnea, coughing, no wheezing ?CV: no chest pain, no palpitations ?GI: no nausea, vomiting, abdominal pain, diarrhea, constipation ?GU: no dysuria, burning on urination, increased urinary frequency, hematuria  ?Ext: has leg edema ?Neuro: no unilateral weakness, numbness, or tingling, no vision change or hearing loss. Has fall. ?Skin: has bruises in both hands.  Has skin laceration in right elbow ?MSK: Has neck pain, left leg pain, right hip pain ?Heme: No easy bruising.  ?Travel history: No recent long distant travel. ? ? ?Allergy:  ?Allergies  ?Allergen Reactions  ? Celebrex [Celecoxib] Anaphylaxis  ? Glipizide Anaphylaxis  ? Levaquin [Levofloxacin In D5w] Other (See Comments)  ?  Heart arrhthymias  ? Levofloxacin Other (See Comments) and Palpitations  ?  ICD fired  ? Lisinopril Swelling  ?  Lip and facial swelling  ? Sulfa Antibiotics Other (See Comments) and Anaphylaxis  ?  Reaction: unknown  ? Metformin Other (See Comments)  ?  Gi tolerance   ? Penicillins Rash and Other (See Comments)  ?  Has patient had a PCN reaction causing immediate rash,  facial/tongue/throat swelling, SOB or lightheadedness with hypotension: Unknown ?Has patient had a PCN reaction causing severe rash involving mucus membranes or skin necrosis: No ?Has patient had a PCN reaction that required hospitalization: No ?Has patient had a PCN reaction occurring within the last 10 years: No ?If all of the above answers are "NO", then may proceed with Cephalosporin use. ?  ? ? ?Past Medical History:  ?Diagnosis Date  ? Anxiety   ? Chronic combined systolic and diastolic CHF (congestive heart failure) ([place])   ? Chronic kidney disease   ? Coronary artery disease   ? Depression   ? Diabetes mellitus without complication ([place])   ? Diabetes mellitus, type II ([place])   ? Hypertension   ? MI (myocardial infarction) ([place])   ? x 5  ? Pacemaker   ? 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[place])   ? Prolonged Q-T interval on ECG   ? Thyroid disease   ? ? ?Past Surgical History:  ?Procedure Laterality Date  ? CENTRAL LINE INSERTION  03/11/2017  ? Procedure: CENTRAL LINE INSERTION;  Surgeon: Leonie Man, MD;  Location: [place] [facility];  Service: Cardiovascular;;  ? CHOLECYSTECTOMY    ? COLONOSCOPY WITH PROPOFOL N/A 09/01/2019  ? Procedure: COLONOSCOPY WITH PROPOFOL;  Surgeon: Toledo, Benay Pike, MD;  Location: [facility] ENDOSCOPY;  Service: Gastroenterology;  Laterality: N/A;  ? CORONARY STENT INTERVENTION W/IMPELLA N/A 03/11/2017  ? Procedure: Coronary Stent Intervention w/Impella;  Surgeon: Leonie Man, MD;  Location: [place] [facility];  Service: Cardiovascular;  Laterality: N/A;  ? ESOPHAGOGASTRODUODENOSCOPY (EGD) WITH PROPOFOL N/A 09/01/2019  ? Procedure: ESOPHAGOGASTRODUODENOSCOPY (EGD) WITH PROPOFOL;  Surgeon: Toledo, Benay Pike, MD;  Location: [facility] END[facility];  Service: Gastroenterology;  Laterality: N/A;  ? ESOPHAGOGASTRODUODENOSCOPY (EGD) WITH PROPOFOL N/A 09/08/2019  ? Procedure: ESOPHAGOGASTRODUODENOSCOPY (EGD) WITH PROPOFOL;  Surgeon: [name], MD;  Location: [facility] ENDOSCOPY;  Service:  Gastroenterology;  Laterality: N/A;  ? ESOPHAGOGASTRODUODENOSCOPY (EGD) WITH PROPOFOL N/A 02/15/2021  ? Procedure: ESOPHAGOGASTRODUODENOSCOPY (EGD) WITH PROPOFOL;  Surgeon: [name], MD;  Location: [facility] ENDOSCOPY;  Service: Gastroenterology;  Laterality: N/A;  ? EYE SURGERY    ? HIP ARTHROPLASTY Right 03/29/2020  ? Procedure: ARTHROPLASTY BIPOLAR HIP (HEMIARTHROPLASTY);  Surgeon: Corky Mull, MD;  Location: [facility] ORS;  Service: Orthopedics;  Laterality: Right;  ? INTRAVASCULAR PRESSURE WIRE/FFR STUDY N/A 03/11/2017  ? Procedure: INTRAVASCULAR PRESSURE WIRE/FFR STUDY;  Surgeon: Leonie Man, MD;  Location: [place] [facility];  Service: Cardiovascular;  Laterality: N/A;  ? INTRAVASCULAR ULTRASOUND/IVUS N/A 03/11/2017  ? Procedure: Intravascular Ultrasound/IVUS;  Surgeon: Leonie Man, MD;  Location: [place] [facility];  Service: Cardiovascular;  Laterality: N/A;  ? LEFT HEART CATH AND CORONARY ANGIOGRAPHY N/A 03/05/2017  ? Procedure: LEFT HEART CATH AND CORONARY ANGIOGRAPHY;  Surgeon: Isaias Cowman, MD;  Location: [place] [facility];  Service: Cardiovascular;  Laterality: N/A;  ? PACEMAKER IMPLANT    ? pacemaker/defibrillator Left   ? ? ?Social History:  reports that she has been smoking e-cigarettes and cigarettes. She has been smoking an average of .25 packs per day. She has never used smokeless tobacco. She reports that she does not currently use alcohol. She reports current drug use. ? ?  Family History:  ?Family History  ?Problem Relation Age of Onset  ? Hypertension Father   ? Heart attack Father   ? Depression Sister   ? Depression Brother   ? Depression Brother   ?  ? ?Prior to Admission medications   ?Medication Sig Start Date End Date Taking? Authorizing Provider  ?albumin human 25 % bottle Inject 100 mLs (25 g total) into the vein as needed (Give with paracentesis). 07/03/21   Enzo Bi, MD  ?allopurinol (ZYLOPRIM) 300 MG tablet Take 300 mg by mouth daily. 04/23/21   [provider]  ?aspirin EC 81 MG tablet Take 81 mg by mouth at bedtime.     [provider]  ?colchicine 0.6 MG tablet Take by mouth.    [provider]  ?docusate sodium (COLACE) 100 MG capsule Take 100 mg by mouth 2 (two) times dail

## 2021-08-08 NOTE — Progress Notes (Addendum)
Radiologist critical alert call: large bleed, increased midline shift : full report relayed to Dr Izora Ribas who states he has seen the report : report is as follows: Significant interval increase in size of large right hemispheric ?subdural hemorrhage with moderate mass effect on the right brain ?parenchyma and proximally 1 cm right to left midline shift. ?

## 2021-08-08 NOTE — Consult Note (Signed)
WOC Nurse Consult Note: ?Reason for Consult:Fall yesterday with skin tear to right lateral arm near olecranon.  The skin edges are not approximated and since the injury was yesterday, the skin is dry and rolled.  I will begin topical wound care to this area.  ?Wound type:trauma ?Pressure Injury POA: NA ?Measurement: 7 cm x 3.2 cm x 0.2 cm skin tear to right forearm.   ?Wound UPJ:SRPRX red and moist ?Drainage (amount, consistency, odor) minimal serosanguinous no odor.  ?Periwound: Ecchymosis ?Dressing procedure/placement/frequency: Cleanse skin tear to right arm with NS and pat dry.  Apply Medihoney to wound bed and devitalized skin flap.  Cover with telfa and kerlix/tape. Change daily.  ?Will not follow at this time.  Please re-consult if needed.  ?Domenic Moras MSN, RN, FNP-BC CWON ?Wound, Ostomy, Continence Nurse ?Pager [phone]  ? ? ? ?  ?

## 2021-08-08 NOTE — Progress Notes (Signed)
Admission profile updated. ?

## 2021-08-08 NOTE — Progress Notes (Signed)
Instant chat to hospitalist oncall provider : asked if ok to administer hydrocodone : permission given to administer  ?

## 2021-08-08 NOTE — Progress Notes (Signed)
Patient had a change in status around 2200 or shortly thereafter with worsening mental status.  Patient taken to CT scan early due to the change.  RN reports anisocoria and diminished level of consciousness. ? ?CT was repeated shortly thereafter. The prior head CT showed an increase in hemorrhage but no significant compression on the brain.  The current CT shows increased thickness to 2.3 cm and ~53m R to L MLS.  This MLS is new and the subdural blood is much thicker and more diffuse than prior. ? ?I have discussed this finding with her daughter and husband.  Heather Peterson has made her wishes clear to her family, and her family has asked that we transition to comfort measures.  Heather Peterson is suffering from multiple long term severe chronic diseases including COPD on oxygen, CHF, CKD, and ESLD that complicate her current function, and her family did not wish to see her suffer any longer. ? ?I have advised them that she will almost certainly pass away from this condition shortly due to the compression on her brain and potentially ongoing bleeding.  They understand the ramifications of choosing to transition to comfort measures rather than pursue surgery, and asked that we transition her goals of care to comfort measures. ? ?I advised them that I thought this decision was appropriate, as her comorbidities, age, and functional status would lead to extremely elevated risk for perioperative mortality even with a technically successful surgery. ? ?They are currently en route to support Heather Peterson through her end of life.   ? ?I have discussed this with the covering hospitalist BSharion Settler who is in agreement with transition to comfort measures. ? ?CMeade MawMD ?

## 2021-08-08 NOTE — Progress Notes (Signed)
Contacted Dr Izora Ribas. Sending patient to CT now instead of 11:30. Change in neurological status. Left Pupil 2, right pupil 5 mm. Vitals stable. Also informed hospitalist on call .  ?

## 2021-08-08 NOTE — ED Triage Notes (Addendum)
Pt comes into the ED via EMS from home, states she tripped and fell getting up going to the BR this morning, daughter reports pt had LOC after hitting her head, hematoma to the back of the head, neck, left shoulder, BL hip, lac to the RLE with clean gauze wrap in place on arrival. Pt is lethargic on arrival , able to arouse with tactile stimuli. C-collar in place  ?Pt normally wears 3L Time at home at night or when laying down ?106/50 ?HR86 ?91%RA ?CBG152 ?

## 2021-08-08 NOTE — Consult Note (Signed)
Neurosurgery-New Consultation Evaluation ?07/18/2021 ?Heather Peterson [id number] ? ?Identifying Statement: ?Heather Peterson is a 81 y.o. female from [place] [id number] with a past medical history of multiple myeloma, DM, CKD, CAD, HTN, CHF, COPD on 2 L, depression.  ? ?Physician Requesting Consultation: No ref. provider found ? ?History of Present Illness: ?Heather Peterson is a 81 y.o presenting after a fall this morning with questionable syncope. Per chart review she was walking through a doorway when she fell to the floor. She daughter evaluated her and called 911.  ?The patient is alone without family at bedside and cannot provide additional history of todays events.  ?She is drowsy but denies any headache, nausea, or vomiting.  ? ?Past Medical History:  ?Past Medical History:  ?Diagnosis Date  ? Anxiety   ? Chronic combined systolic and diastolic CHF (congestive heart failure) ([place])   ? Chronic kidney disease   ? Coronary artery disease   ? Depression   ? Diabetes mellitus without complication ([place])   ? Diabetes mellitus, type II ([place])   ? Hypertension   ? MI (myocardial infarction) ([place])   ? x 5  ? Pacemaker   ? 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[place])   ? Prolonged Q-T interval on ECG   ? Thyroid disease   ? ? ?Social History: ?Social History  ? ?Socioeconomic History  ? Marital status: Married  ?  Spouse name: rodney  ? Number of children: 2  ? Years of education: Not on file  ? Highest education level: High school graduate  ?Occupational History  ? Not on file  ?Tobacco Use  ? Smoking status: Every Day  ?  Packs/day: 0.25  ?  Types: E-cigarettes, Cigarettes  ? Smokeless tobacco: Never  ?Vaping Use  ? Vaping Use: Former  ?Substance and Sexual Activity  ? Alcohol use: Not Currently  ?  Comment: occasionally  ? Drug use: Yes  ?  Comment: prescribed pain meds  ? Sexual activity: Not Currently  ?Other Topics Concern  ? Not on file  ?Social History Narrative  ? Not on file  ? ?Social Determinants of Health   ? ?Financial Resource Strain: Not on file  ?Food Insecurity: Not on file  ?Transportation Needs: Not on file  ?Physical Activity: Not on file  ?Stress: Not on file  ?Social Connections: Not on file  ?Intimate Partner Violence: Not on file  ? ?Living arrangements (living alone, with partner): With her husband ? ?Family History: ?Family History  ?Problem Relation Age of Onset  ? Hypertension Father   ? Heart attack Father   ? Depression Sister   ? Depression Brother   ? Depression Brother   ? ? ?Review of Systems: ? ?Review of Systems - General ROS: Negative ?Psychological ROS: Negative ?Ophthalmic ROS: Negative ?ENT ROS: Negative ?Hematological and Lymphatic ROS: Negative  ?Endocrine ROS: Negative ?Respiratory ROS: Negative ?Cardiovascular ROS: Negative ?Gastrointestinal ROS: Negative ?Genito-Urinary ROS: Negative ?Musculoskeletal ROS: Negative ?Neurological ROS: Negative ?Dermatological ROS: Negative ? ?Physical Exam: ?BP (!) 94/57   Pulse 76   Temp (!) 97.5 ?F (36.4 ?C) (Oral)   Resp 16   Ht 5' 3"  (1.6 m)   Wt 85.3 kg   SpO2 93%   BMI 33.31 kg/m?  Body mass index is 33.31 kg/m?Marland Kitchen Body surface area is 1.95 meters squared. ?General appearance: drowsy but arouses to voice, ?Head: Normocephalic, atraumatic ?Eyes: Normal, EOM intact ?Oropharynx: Moist without lesions ?Neck: Supple, no tenderness ?Heart: Normal, regular rate and rhythm, without murmur ?Lungs:  no obvious SOB. Pt on [place]  ?Abdomen: Soft, nondistended ?Ext: No edema in LE bilaterally, good distal pulses ? ?Neurologic exam:  ?Mental status: alertness: alert, orientation: person, place, time, affect: normal ?Speech: fluent and clear ?Cranial nerves:  ?CN II-XII grossly intact  ?Motor:strength symmetric 5/5, normal muscle mass and tone in all extremities and no pronator drift ?Sensory: intact to light touch in all extremities ?Gait: untested ? ?Laboratory: ?Results for orders placed or performed during the hospital encounter of 07/17/2021  ?CBC  ?Result  Value Ref Range  ? WBC 8.1 4.0 - 10.5 K/uL  ? RBC 3.24 (L) 3.87 - 5.11 MIL/uL  ? Hemoglobin 9.7 (L) 12.0 - 15.0 g/dL  ? HCT 32.0 (L) 36.0 - 46.0 %  ? MCV 98.8 80.0 - 100.0 fL  ? MCH 29.9 26.0 - 34.0 pg  ? MCHC 30.3 30.0 - 36.0 g/dL  ? RDW 22.3 (H) 11.5 - 15.5 %  ? Platelets 175 150 - 400 K/uL  ? nRBC 0.2 0.0 - 0.2 %  ?Comprehensive metabolic panel  ?Result Value Ref Range  ? Sodium 135 135 - 145 mmol/L  ? Potassium 4.5 3.5 - 5.1 mmol/L  ? Chloride 100 98 - 111 mmol/L  ? CO2 27 22 - 32 mmol/L  ? Glucose, Bld 114 (H) 70 - 99 mg/dL  ? BUN 55 (H) 8 - 23 mg/dL  ? Creatinine, Ser 2.30 (H) 0.44 - 1.00 mg/dL  ? Calcium 9.1 8.9 - 10.3 mg/dL  ? Total Protein 6.3 (L) 6.5 - 8.1 g/dL  ? Albumin 3.2 (L) 3.5 - 5.0 g/dL  ? AST 25 15 - 41 U/L  ? ALT 8 0 - 44 U/L  ? Alkaline Phosphatase 88 38 - 126 U/L  ? Total Bilirubin 0.9 0.3 - 1.2 mg/dL  ? GFR, Estimated 21 (L) >60 mL/min  ? Anion gap 8 5 - 15  ?Ammonia  ?Result Value Ref Range  ? Ammonia 20 9 - 35 umol/L  ?Blood gas, venous  ?Result Value Ref Range  ? pH, Ven 7.33 7.25 - 7.43  ? pCO2, Ven 55 44 - 60 mmHg  ? pO2, Ven PENDING 32 - 45 mmHg  ? Bicarbonate 29.0 (H) 20.0 - 28.0 mmol/L  ? Acid-Base Excess 1.9 0.0 - 2.0 mmol/L  ? O2 Saturation 31.1 %  ? Patient temperature 37.0   ? Collection site VEIN   ?Magnesium  ?Result Value Ref Range  ? Magnesium 2.4 1.7 - 2.4 mg/dL  ?Protime-INR  ?Result Value Ref Range  ? Prothrombin Time 16.1 (H) 11.4 - 15.2 seconds  ? INR 1.3 (H) 0.8 - 1.2  ?Troponin I (High Sensitivity)  ?Result Value Ref Range  ? Troponin I (High Sensitivity) 23 (H) <18 ng/L  ? ?I personally reviewed labs ? ?Imaging: ? ?CT head 08/03/2021  ?Small left sided SDH without midline shift of mass effect ? ?I personally reviewed radiology studies to include: ? ? ? ? ?Impression/Plan:  ? ? ? ?1.  Diagnosis: small SDH  ? ?2.  Plan ?- repeat head CT at 6 hour interval ?- recheck INR ?- hold anticoagulation ?- admit to medicine for further work up ?- no plans for acute neurosurgical  intervention at this time ? ?Cooper Render PA-C ?Neurosurgery ?  ?

## 2021-08-08 NOTE — Progress Notes (Signed)
Cross Cover ?Change in patient neuro exam with CT showing worsening SDH with increase in midline shift. Family informed of findings by Dr. Izora Ribas. Family has decided not to pursue surgery or heroic measures and focus on comfort care. Discussed with nurse. Immediate comfort needs met. Requested pastoral care services to be be called for additional family support ?

## 2021-08-08 NOTE — Progress Notes (Signed)
Patient refused Demadex, she states she only takes it as needed at home: informed oncall med level ?

## 2021-08-08 NOTE — ED Provider Notes (Signed)
? ?[facility] ?Provider Note ? ? ? Event Date/Time  ? First MD Initiated Contact with Patient 07/11/2021 0835   ?  (approximate) ? ? ?History  ? ?Loss of Consciousness and Fall ? ? ?HPI ? ?Heather Peterson is a 81 y.o. 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 ?  ?Advises she got up this morning, she was walking through a doorway and she is not sure but thinks she fell.  She reports she does not remember all of it.  She fell to the floor, struck her right shin against a piece of furniture. ? ?She is not sure if she lost consciousness.  Apparently daughter evaluated her, call 911. ? ?She denies chest pain.  No shortness of breath or trouble breathing.  She reports she feels fatigued, or a little "out of it" but she cannot describe it too well.  Does not recall much around the fall. ? ?Denies pain in any particular spot.  Orts she tore the skin on her right forearm from a fall yesterday as well ? ?Recently left rehab after hospital stay ? ?No noted further bleeding or dark stools.  No chest pain no shortness of breath or fever.  No trouble breathing ? ?Denies pain in her shoulders arms or legs or hips.  Apparently reported to EMS some achiness in both hips and also around her left shoulder but denies that now. ? ?Physical Exam  ? ?Triage Vital Signs: ?ED Triage Vitals [07/12/2021 0839]  ?Enc Vitals Group  ?   BP 100/61  ?   Pulse Rate 83  ?   Resp 16  ?   Temp (!) 97.5 ?F (36.4 ?C)  ?   Temp Source Oral  ?   SpO2 99 %  ?   Weight   ?   Height   ?   Head Circumference   ?   Peak Flow   ?   Pain Score   ?   Pain Loc   ?   Pain Edu?   ?   Excl. in GC?   ? ? ?Most recent vital signs: ?Vitals:  ? 07/17/2021  1030 07/19/2021 1045  ?BP: (!) 86/65 97/61  ?Pulse: 75 73  ?Resp: 15 (!) 25  ?Temp:    ?SpO2: 100% 100%  ? ? ? ?General: Awake, no distress.  Slightly somnolent, but easily alerts to voice but tends to fall back to sleep after a few minutes and last stimulated.  Appears chronically ill ?CV:  Good peripheral perfusion.  Normal heart tones.  No tachycardia ?Resp:  Normal effort.  2 L oxygen saturation normal.  No respiratory distress.  On exam does report a mild discomfort to palpation along the left lateral inferior chest wall margin without hematoma or bruising noted ?Abd:  No distention.  Soft nontender.  No obvious ascites ?Other:  Moves all extremities to range of motion, no pain or discomfort.  Denies pain to palpation or movement of major joints including shoulders elbows hands wrists hips knees ankles and feet bilaterally ?A small skin tear is noted over her right anterior shin that has been bandaged by EMS less than 1 cm in size ? ?Also a fairly large skin tear over her right   forearm is noted probably 6 to 7 cm in size and ovoid shaped that she reports was from yesterday, does not appear to be superinfected.  No skin lacerations noted ? ?Cervical collar in place ? ?Normocephalic atraumatic except for small abrasions where her eyeglasses sat, no laceration ? ? ? ? ?ED Results / Procedures / Treatments  ? ?Labs ?(all labs ordered are listed, but only abnormal results are displayed) ?Labs Reviewed  ?CBC - Abnormal; Notable for the following components:  ?    Result Value  ? RBC 3.24 (*)   ? Hemoglobin 9.7 (*)   ? HCT 32.0 (*)   ? RDW 22.3 (*)   ? All other components within normal limits  ?COMPREHENSIVE METABOLIC PANEL - Abnormal; Notable for the following components:  ? Glucose, Bld 114 (*)   ? BUN 55 (*)   ? Creatinine, Ser 2.30 (*)   ? Total Protein 6.3 (*)   ? Albumin 3.2 (*)   ? GFR, Estimated 21 (*)   ? All other components within normal limits  ?BLOOD GAS, VENOUS - Abnormal; Notable for the following  components:  ? Bicarbonate 29.0 (*)   ? All other components within normal limits  ?PROTIME-INR - Abnormal; Notable for the following components:  ? Prothrombin Time 16.1 (*)   ? INR 1.3 (*)   ? All other components within normal limits  ?TROPONIN I (HIGH SENSITIVITY) - Abnormal; Notable for the following components:  ? Troponin I (High Sensitivity) 23 (*)   ? All other components within normal limits  ?AMMONIA  ?MAGNESIUM  ?URINALYSIS, COMPLETE (UACMP) WITH MICROSCOPIC  ?TROPONIN I (HIGH SENSITIVITY)  ? ? ? ?EKG ? ?Reviewed inter by me at 842 ?Heart rate 80 ?QRS 100 ?QTc 560 ?Normal sinus rhythm, marked ST segment flattening and QT prolongation, no obvious ischemia, but concern for underlying electrocardiographic abnormality or possible underlying electrolyte type abnormality is noted.  QT appears prolonged.  Compared with previous EKGs this appears similar in appearance, I do not see evidence of obvious acute change except I do feel there may be persistent prolongation of the QT interval similar to previous ? ? ?RADIOLOGY ? ?CT head and cervical spine ?IMPRESSION: ?1. Bilateral acute on subacute subdural hematomas, left greater than ?right. ?2. Mild left-to-right midline shift measuring 2 mm. ?3. 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 ?4. No evidence for cervical spine fracture or subluxation. ?5. Mild cervical degenerative disc disease. ?6. Small right pleural effusion and mild interstitial edema. ?7. Aortic Atherosclerosis (ICD10-I70.0) and Emphysema (ICD10-J43.9).  ? ? ? ?I personally reviewed and interpreted the patient's CT scan of the head is notable for subdural hematoma.  See radiology report for additional detail.  Discussed with radiologist as well as Dr. Cari Caraway of neurosurgery ? ?PROCEDURES: ? ?Critical Care performed: Yes, see critical care procedure note(s) ? ?CRITICAL CARE ?Performed by: Delman Kitten ? ? ?Total critical care time: 30  minutes ? ?Critical care time was exclusive of separately billable procedures and treating other patients. ? ?Critical care was necessary to treat or prevent imminent or life-threatening deterioration. ? ?Critical care was time spent personally by me on the following activities: development of treatment plan with patient and/or surrogate as well as nursing, discussions with consultants, evaluation of patient's response to treatment, examination of patient, obtaining history from patient or surrogate, ordering and performing treatments and interventions, ordering and review of laboratory studies, ordering and review of radiographic studies, pulse oximetry and re-evaluation of patient's  condition. ? ? ?Procedures ? ? ?MEDICATIONS ORDERED IN ED: ?Medications  ?sodium chloride 0.9 % bolus 500 mL (has no administration in time range)  ? ? ? ?IMPRESSION / MDM / ASSESSMENT AND PLAN / ED COURSE  ?I reviewed the triage vital signs and the nursing notes. ?             ?               ? ?Differential diagnosis includes, but is not limited to, syncope with a broad differential.  Patient has a complicated recent medical history, she reports falling yesterday and then today's episode with abrasions around her eyeglass sites and poor recollection of the event seems to suggest a possible syncopal episode ? ?We will proceed with CT scan of the head and neck ?Lab testing EKG, fall precautions, telemetry monitoring, and broad work-up ? ?The patient is on the cardiac monitor to evaluate for evidence of arrhythmia and/or significant heart rate changes. ? ?Labs reviewed, notable for chronic renal disease creatinine 2.3, INR less than 1.5, hemoglobin 9.7 near patient's baseline chronic anemia, VBG with normal pH without evidence of acute hypercarbia, ammonia normal ? ?Chest x-ray reviewed and normal ? ?Clinical Course as of 08/09/2021 1103  ?Wed Aug 08, 2021  ?0940 Discussed case and imaging which Dr. Yarborough of neurosurgery was also  reviewed.  He recommends recheck INR and would consider treatment of 5 hours greater than 2.5 for possible reversal, but otherwise advises careful monitoring and recommends a repeat CT scan of the head in about 6 hours.  Neuro

## 2021-08-08 NOTE — ED Notes (Signed)
Informed RN bed assigned 

## 2021-08-08 NOTE — Progress Notes (Signed)
Spoke with family about the worsening subdural hematoma.  I did not recommend surgery currently due to the lack of significant compression on her brain. ? ?We will reevaluate with HCT in 6 hours. ?

## 2021-08-09 DIAGNOSIS — S065XAA Traumatic subdural hemorrhage with loss of consciousness status unknown, initial encounter: Secondary | ICD-10-CM | POA: Diagnosis not present

## 2021-08-09 MED ORDER — GLYCOPYRROLATE 1 MG PO TABS
1.0000 mg | ORAL_TABLET | ORAL | Status: DC | PRN
  Filled 2021-08-09: qty 1

## 2021-08-09 MED ORDER — ONDANSETRON 4 MG PO TBDP
4.0000 mg | ORAL_TABLET | Freq: Four times a day (QID) | ORAL | Status: DC | PRN
  Filled 2021-08-09: qty 1

## 2021-08-09 MED ORDER — ONDANSETRON HCL 4 MG/2ML IJ SOLN: 4.0000 mg | Freq: Four times a day (QID) | INTRAMUSCULAR | Status: DC | PRN

## 2021-08-09 MED ORDER — ACETAMINOPHEN 325 MG PO TABS
650.0000 mg | ORAL_TABLET | Freq: Four times a day (QID) | ORAL | Status: DC | PRN
Start: 2021-08-09 — End: 2021-08-10

## 2021-08-09 MED ORDER — ACETAMINOPHEN 650 MG RE SUPP: 650.0000 mg | Freq: Four times a day (QID) | RECTAL | Status: DC | PRN

## 2021-08-09 MED ORDER — LORAZEPAM 2 MG/ML PO CONC: 1.0000 mg | ORAL | Status: DC | PRN

## 2021-08-09 MED ORDER — LORAZEPAM 2 MG/ML IJ SOLN: 1.0000 mg | INTRAMUSCULAR | Status: DC | PRN

## 2021-08-09 MED ORDER — MORPHINE 100MG IN NS 100ML (1MG/ML) PREMIX INFUSION
2.0000 mg/h | INTRAVENOUS | Status: DC
  Administered 2021-08-09: 2 mg/h via INTRAVENOUS
  Administered 2021-08-09: 5 mg/h via INTRAVENOUS
  Filled 2021-08-09 (×2): qty 100

## 2021-08-09 MED ORDER — BIOTENE DRY MOUTH MT LIQD: 15.0000 mL | OROMUCOSAL | Status: DC | PRN

## 2021-08-09 MED ORDER — LORAZEPAM 1 MG PO TABS: 1.0000 mg | ORAL_TABLET | ORAL | Status: DC | PRN

## 2021-08-09 MED ORDER — POLYVINYL ALCOHOL 1.4 % OP SOLN
1.0000 [drp] | Freq: Four times a day (QID) | OPHTHALMIC | Status: DC | PRN
  Filled 2021-08-09: qty 15

## 2021-08-09 MED ORDER — GLYCOPYRROLATE 0.2 MG/ML IJ SOLN: 0.2000 mg | INTRAMUSCULAR | Status: DC | PRN

## 2021-08-09 NOTE — Assessment & Plan Note (Signed)
Patient with history of recurrent falls and fractures, this fall resulted in subdural hemorrhage. ?Patient with worsening subdural hemorrhage with midline shift. ?Currently unresponsive with agonal breathing. ?Family decided to proceed with comfort measures only. ?Multiple baseline comorbidities. ? ?-Comfort measures initiated ?-Cardiology was notified to deactivate defibrillator. ?-Anticipating hospital death ? ?

## 2021-08-09 NOTE — Plan of Care (Signed)
°  Problem: Coping: °Goal: Level of anxiety will decrease °Outcome: Progressing °  °

## 2021-08-09 NOTE — Progress Notes (Signed)
?Progress Note ? ? ?Patient: Heather Peterson DOB: Oct 16, 1940 DOA: 07/25/2021     1 ?DOS: the patient was seen and examined on 08/09/2021 ?  ?Brief hospital course: ?Taken from H&P. ? ?Heather Peterson is a 81 y.o. female with medical history significant of hypertension, hyperlipidemia, hypothyroidism, gout, depression with anxiety, V. 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 ?  ?Patient was recently hospitalized from 2/15 - 2/21 due to multiple issues, including fall, left fibula fracture and SBP. Pt had rehab, and was discharged home 2 weeks ago.  Per her daughter, patient tripped and fell in the early morning when she was going to the bathroom. Pt lost consciousness for a while.  No new unilateral numbness or tinglings in extremities.  No facial droop or slurred speech.  Patient complains of headache, neck pain, worsening left leg pain, and right hip pain.  Patient has skin laceration in right elbow area. Patient has mild dry cough and mild shortness of breath, denies chest pain.  No fever or chills.  No nausea, vomiting, diarrhea or abdominal pain.  No symptoms of UTI. ? ?pt was found to have WBC 8.1, INR 1.3, troponin level 23, ammonia level 20, stable renal function, temperature normal, blood pressure 94/57, heart rate 83, RR 20, oxygen saturation 93-100% on 2 L oxygen, chest x-ray negative.  CT of C-spine negative for acute injury but showed degenerative disc disease.  CT of the head showed bilateral small subdural hematoma with left to right midline shift 2 mm.  Patient is admitted to PCU as inpatient.  Dr. Cari Caraway for neurosurgery is  consulted. ? ?Due to worsening mentation, CT head was repeated overnight by the nighttime provider which shows significant increase in size of large right hemispheric subdural hemorrhage with moderate mass effect on the right brain parenchyma and approximately 1 cm right to left midline shift. ?Neurosurgery talked with the family about an emergent procedure.  Family decided to transition her to comfort measures only due to continuous suffering and her wishes which were made clear to her family that she does not want any more procedures. ? ?Patient was transitioned to comfort care only. ?Cardiology was contacted today to deactivate defibrillator. ? ?Patient was unresponsive with agonal breathing when seen today.  Multiple family members in the room.  Anticipating hospital death. ? ? ? ?Assessment and Plan: ?* Subdural hematoma ?Patient with history of recurrent falls and fractures, this fall resulted in subdural hemorrhage. ?Patient with worsening subdural hemorrhage with midline shift. ?Currently unresponsive with agonal breathing. ?Family decided to proceed with comfort measures only. ?Multiple baseline comorbidities. ? ?-Comfort measures initiated ?-Cardiology was notified to deactivate defibrillator. ?-Anticipating hospital death ? ? ? ?Subjective: Patient was made comfort care after having worsening neurologic status overnight. ?Currently unresponsive and agonal breathing.  Multiple family members in the room. ? ?Physical Exam: ?Vitals:  ? 08/09/21 0800 08/09/21 0900 08/09/21 1000 08/09/21 1100  ?BP: 114/69     ?Pulse: (!) 110 (!) 108 (!) 101 90  ?Resp: 14 (!) 8 (!) 8   ?Temp: 98.4 ?F (36.9 ?C)     ?TempSrc: Oral     ?SpO2: (!) 82% (!) 59% (!) 76% Marland Kitchen)  31%  ?Weight:      ?Height:      ? ?General.  Unresponsive elderly lady with agonal breathing. ?Pulmonary.  Lungs clear bilaterally, normal respiratory effort. ?CV.  Sinus tachycardia ?Abdomen.  Soft, nontender, nondistended, BS positive. ?CNS.   Unresponsive ?Extremities.  No edema, no cyanosis, pulses intact and symmetrical. ? ?Data Reviewed: ?Prior notes, labs and images reviewed. ? ?Family Communication: Multiple family members at bedside. ? ?Disposition: ?Status is: Inpatient ?Remains inpatient appropriate because: Severity of illness ? ? Planned Discharge Destination: Anticipating hospital death ? ?Time spent: 45 minutes  ? ?This record has been created using Systems analyst. Errors have been sought and corrected,but may not always be located. Such creation errors do not reflect on the standard of care. ? ?Author: ?[name], MD ?08/09/2021 1:00 PM ? ?For on call review [email or web address].  ?

## 2021-08-09 NOTE — Progress Notes (Signed)
?   08/09/21 0000  ?Clinical Encounter Type  ?Visited With Patient and family together  ?Visit Type Initial  ?Referral From Nurse  ?Consult/Referral To Chaplain  ? ?Chaplain responded to nurse page. Patient moving to comfort care. Family at bedside. Chaplain provided compassionate presence and reflective listening as family told stories about patient adventures in [place]. Chaplain provided prayer and a prayer shawl. Family appreciated Chaplain visit.  ?

## 2021-08-09 NOTE — Progress Notes (Signed)
PT Cancellation Note ? ?Patient Details ?Name: Heather Peterson ?MRN: [id number] ?DOB: 11/07/1940 ? ? ?Cancelled Treatment:    Reason Eval/Treat Not Completed: Other (comment).  PT consult received.  Chart reviewed.  Per chart, pt transitioned to comfort care.  PT will sign off at this time. ? ?Leitha Bleak, PT ?08/09/21, 8:20 AM ? ?

## 2021-08-09 NOTE — Progress Notes (Signed)
Message received from Dr. Reesa Chew that Heather Peterson is comfort care and she has orders to turn off her defibrillator. I confirmed with the family that this is their wish and brought Katina Degree, RN to the bedside with me. I taped a magnet on her left chest where her defibrillator is placed to turn off the defibrillation setting at 1030am on 08/09/21. I have also contacted the [facility]. [employer] device rep.  ? ?Tristan Schroeder, PA-C  ?[facility] Cardiology ?

## 2021-08-09 NOTE — Hospital Course (Signed)
Taken from H&P. ? ?Heather Peterson is a 81 y.o. female with medical history significant of hypertension, hyperlipidemia, hypothyroidism, gout, depression with anxiety, V. 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 ?  ?Patient was recently hospitalized from 2/15 - 2/21 due to multiple issues, including fall, left fibula fracture and SBP. Pt had rehab, and was discharged home 2 weeks ago.  Per her daughter, patient tripped and fell in the early morning when she was going to the bathroom. Pt lost consciousness for a while.  No new unilateral numbness or tinglings in extremities.  No facial droop or slurred speech.  Patient complains of headache, neck pain, worsening left leg pain, and right hip pain.  Patient has skin laceration in right elbow area. Patient has mild dry cough and mild shortness of breath, denies chest pain.  No fever or chills.  No nausea, vomiting, diarrhea or abdominal pain.  No symptoms of UTI. ? ?pt was found to have WBC 8.1, INR 1.3, troponin level 23, ammonia level 20, stable renal function, temperature normal, blood pressure 94/57, heart rate 83, RR 20, oxygen saturation 93-100% on 2 L oxygen, chest x-ray negative.  CT of C-spine negative for acute injury but showed degenerative disc disease.  CT of the head showed bilateral small subdural hematoma with left to right midline shift 2 mm.  Patient is admitted to PCU as inpatient.  Dr. Cari Caraway for neurosurgery is consulted. ? ?Due to worsening mentation, CT head was repeated overnight by the nighttime provider which shows significant increase in size of large right hemispheric subdural  hemorrhage with moderate mass effect on the right brain parenchyma and approximately 1 cm right to left midline shift. ?Neurosurgery talked with the family about an emergent procedure.  Family decided to transition her to comfort measures only due to continuous suffering and her wishes which were made clear to her family that she does not want any more procedures. ? ?Patient was transitioned to comfort care only. ?Cardiology was contacted today to deactivate defibrillator. ? ?Patient was unresponsive with agonal breathing when seen today.  Multiple family members in the room.  Anticipating hospital death. ? ?

## 2021-08-09 NOTE — Progress Notes (Signed)
OT Cancellation Note ? ?Patient Details ?Name: Heather Peterson ?MRN: [id number] ?DOB: 04-17-1941 ? ? ?Cancelled Treatment:    Reason Eval/Treat Not Completed: Other (comment) (per chart review, patient is now on comfort care. OT will sign off at this time.) ? ?Shanon Payor, OTD OTR/L  ?08/09/21, 7:58 AM  ?

## 2021-08-09 NOTE — TOC Initial Note (Signed)
Transition of Care (TOC) - Initial/Assessment Note  ? ? ?Patient Details  ?Name: Consepcion Utt Peterson ?MRN: [id number] ?Date of Birth: Dec 09, 1940 ? ?Transition of Care (TOC) CM/SW Contact:    ?Shelbie Hutching, RN ?Phone Number: ?08/09/2021, 10:27 AM ? ?Clinical Narrative:                 ?Family has decided to place patient on comfort care.  TOC will sign off.   ? ?  ?  ? ? ?Patient Goals and CMS Choice ?  ?  ?  ? ?Expected Discharge Plan and Services ?  ?  ?  ?  ?  ?                ?  ?  ?  ?  ?  ?  ?  ?  ?  ?  ? ?Prior Living Arrangements/Services ?  ?  ?  ?       ?  ?  ?  ?  ? ?Activities of Daily Living ?Home Assistive Devices/Equipment: Oxygen ?ADL Screening (condition at time of admission) ?Patient's cognitive ability adequate to safely complete daily activities?: No ?Is the patient deaf or have difficulty hearing?: No ?Does the patient have difficulty seeing, even when wearing glasses/contacts?: No ?Does the patient have difficulty concentrating, remembering, or making decisions?: No ?Patient able to express need for assistance with ADLs?: Yes ?Does the patient have difficulty dressing or bathing?: Yes ?Independently performs ADLs?: No ?Communication: Independent ?Dressing (OT): Independent ?Grooming: Independent ?Feeding: Independent ?Bathing: Needs assistance ?Is this a change from baseline?: Pre-admission baseline ?Toileting: Needs assistance ?Is this a change from baseline?: Pre-admission baseline ?In/Out Bed: Needs assistance ?Is this a change from baseline?: Pre-admission baseline ?Walks in Home: Independent with device (comment) ?Does the patient have difficulty walking or climbing stairs?: Yes ?Weakness of Legs: Both ?Weakness of Arms/Hands: None ? ?Permission Sought/Granted ?  ?  ?   ?   ?   ?   ? ?Emotional Assessment ?  ?  ?  ?  ?  ?  ? ?Admission diagnosis:  Syncope and collapse [R55] ?Subdural hematoma ([place]) [S06.5XAA] ?SDH (subdural hematoma) ([place]) [S06.5XAA] ?Skin tear of right forearm without  complication, initial encounter [S51.811A] ?Noninfected skin tear of right lower extremity, initial encounter [S81.811A] ?Patient Active Problem List  ? Diagnosis Date Noted  ? Type II diabetes mellitus with renal manifestations ([place]) 07/26/2021  ? Hypothyroidism 07/29/2021  ? Depression with anxiety 07/18/2021  ? CKD (chronic kidney disease), stage IV ([place]) 07/17/2021  ? SDH (subdural hematoma) 07/28/2021  ? Liver cirrhosis ([place]) 07/22/2021  ? Elevated troponin 08/05/2021  ? Hypotension 06/27/2021  ? Symptomatic anemia 02/12/2021  ? Leukocytosis 02/12/2021  ? Lactic acid acidosis 02/12/2021  ? Chemotherapy-induced peripheral neuropathy ([place]) 10/06/2020  ? Hospital discharge follow-up 08/31/2020  ? Difficulty sleeping 07/25/2020  ? Difficulty walking 07/25/2020  ? H/O subarachnoid hemorrhage 07/25/2020  ? Nausea 07/25/2020  ? Moderate protein-calorie malnutrition ([place]) 07/04/2020  ? Subdural hematoma 06/26/2020  ? Hyperkalemia 06/19/2020  ? Chronic respiratory failure ([place]) 06/19/2020  ? Hypokalemia   ? Fall   ? Troponin I above reference range   ? Status post hip hemiarthroplasty   ? Closed nondisplaced fracture of greater trochanter of right femur ([place]) 03/30/2020  ? Closed displaced midcervical fracture of right femur ([place]) 03/30/2020  ? Hip fracture ([place]) 03/28/2020  ? Multiple myeloma ([place]) 03/01/2020  ? Goals of care, counseling/discussion 03/01/2020  ? Osteomyelitis ([place]) 01/20/2020  ? Pain in finger  of left hand 01/20/2020  ? Gout 10/27/2019  ? Acute blood loss anemia   ? Gastrointestinal hemorrhage   ? UGI bleed 09/04/2019  ? Drug-induced constipation 07/26/2019  ? Insomnia due to medical condition 07/26/2019  ? Benign hypertensive kidney disease with chronic kidney disease 07/20/2019  ? Proteinuria 07/20/2019  ? Hyposmolality and/or hyponatremia 07/20/2019  ? Dehydration   ? Anemia in chronic kidney disease (CKD) 07/04/2019  ? Stage 3b chronic kidney disease ([place]) 07/04/2019  ? Chronic respiratory failure  with hypoxia ([place]) 07/03/2019  ? Hyponatremia 07/03/2019  ? Prolonged Q-T interval on ECG   ? Weakness   ? Anemia   ? MDD (major depressive disorder), recurrent episode, mild ([place]) 12/14/2018  ? Tobacco use disorder 12/14/2018  ? At risk for prolonged QT interval syndrome 12/14/2018  ? Hypothyroidism, acquired, 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  ? Pedal edema 02/19/2018  ? SOB (shortness of breath) on exertion 02/19/2018  ? Left main coronary artery disease 03/07/2017  ? Drug-induced torsades de pointes 03/06/2017  ? Ventricular tachycardia 03/05/2017  ? Defibrillator discharge 03/04/2017  ? Chronic systolic CHF (congestive heart failure) ([place]) 03/04/2017  ? COPD (chronic obstructive pulmonary disease) ([place]) 02/10/2017  ? Diabetes mellitus type 2, uncomplicated ([place]) 38/46/6599  ? H/O ventricular fibrillation 02/10/2017  ? Hyperlipidemia 02/10/2017  ? 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  ? Ischemic cardiomyopathy 02/10/2017  ? Myocardial infarction ([place]) 02/10/2017  ? Moderate mitral regurgitation 02/10/2017  ? Syncope 02/04/2017  ? CAD (coronary artery disease) 02/04/2017  ? HTN (hypertension) 02/04/2017  ? CKD stage 4 due to type 2 diabetes mellitus ([place]) 02/04/2017  ? ?PCP:  [name], MD ?Pharmacy:   ?[facility] [place], [place] AT [facility] OF [address] ?[place] ?[place] [address] ?Phone: [phone] Fax: [phone] ? ?[facility], [place] - [address] ?[place] [address] ?Phone: [phone] Fax: [phone] ? ? ? ? ?Social Determinants of Health (SDOH) Interventions ?  ? ?Readmission Risk Interventions ? ?  06/28/2021  ?  4:49 PM 02/20/2021  ? 11:38 AM 02/16/2021  ? 12:59 PM  ?Readmission Risk Prevention Plan  ?Transportation Screening Complete Complete Complete  ?Medication Review Press photographer) Complete Complete Complete  ?PCP or Specialist appointment within 3-5 days of discharge Complete   Complete  ?[place] or Home Care Consult Complete Complete Complete  ?SW Recovery Care/Counseling Consult Complete Complete Complete  ?Palliative Care Screening Complete Not Applicable Not Applicable  ?[place] Patient Refused Patient Refused Complete  ? ? ? ?

## 2021-08-11 NOTE — Progress Notes (Signed)
Small bag found under covers in the patients bed that contained a grey colored watch and a round white device resembling a watch. Items placed inside the body bag with patient sticker/label. ?

## 2021-08-11 NOTE — Progress Notes (Addendum)
Cross Cover ?Informed by RN, patient pronounced [id number] today, 2021/08/12.  Family at bedside. ?

## 2021-08-11 NOTE — Progress Notes (Addendum)
Patient expired at [facility]. Verified by two nurses: Allegra Lai RN and Denyse Amass RN. Provider B. Randol Kern NP notified. ? ?Family at bedside. Emotional support provided. ?

## 2021-08-11 DEATH — deceased

## 2021-08-27 LAB — BLOOD GAS, VENOUS
Acid-Base Excess: 1.9 mmol/L (ref 0.0–2.0)
Bicarbonate: 29 mmol/L — ABNORMAL HIGH (ref 20.0–28.0)
O2 Saturation: 31.1 %
Patient temperature: 37
pCO2, Ven: 55 mmHg (ref 44–60)
pH, Ven: 7.33 (ref 7.25–7.43)

## 2021-09-10 NOTE — Discharge Summary (Signed)
?Physician Discharge Summary ?  ?Patient: Heather Peterson MRN: [id number] DOB: [birth year]  ?Admit date:     07/16/2021  ?Discharge date: Aug 25, 2021  ?Discharge Physician: Heather Peterson  ? ?PCP: Heather Hartigan, MD  ? ?Patient died in the hospital. ? ?Discharge Diagnoses: ?Principal Problem: ?  Subdural hematoma ([place]) ?Active Problems: ?  Syncope ?  Fall ?  Chronic systolic CHF (congestive heart failure) ([place]) ?  CAD (coronary artery disease) ?  HTN (hypertension) ?  Ventricular tachycardia ?  COPD (chronic obstructive pulmonary disease) ([place]) ?  Hyperlipidemia ?  Tobacco use disorder ?  Chronic respiratory failure with hypoxia (HCC) ?  Anemia in chronic kidney disease (CKD) ?  Multiple myeloma ([place]) ?  Type II diabetes mellitus with renal manifestations ([place]) ?  Hypothyroidism ?  Depression with anxiety ?  CKD (chronic kidney disease), stage IV ([place]) ?  SDH (subdural hematoma) (HCC) ?  Liver cirrhosis ([place]) ?  Elevated troponin ? ?Resolved Problems: ?  * No resolved hospital problems. * ? ?Hospital Course: ?Taken from H&P. ? ?Heather Peterson is a 81 y.o. female with medical history significant of hypertension, hyperlipidemia, hypothyroidism, gout, depression with anxiety, V. 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 ?  ?Patient was recently hospitalized from 2/15 - 2/21 due to multiple issues, including fall, left fibula fracture and SBP. Pt had rehab, and was discharged home 2 weeks ago.  Per her daughter, patient tripped and fell in the early morning when she was going to the bathroom. Pt lost consciousness for a while.  No new unilateral numbness or  tinglings in extremities.  No facial droop or slurred speech.  Patient complains of headache, neck pain, worsening left leg pain, and right hip pain.  Patient has skin laceration in right elbow area. Patient has mild dry cough and mild shortness of breath, denies chest pain.  No fever or chills.  No nausea, vomiting, diarrhea or abdominal pain.  No symptoms of UTI. ? ?pt was found to have WBC 8.1, INR 1.3, troponin level 23, ammonia level 20, stable renal function, temperature normal, blood pressure 94/57, heart rate 83, RR 20, oxygen saturation 93-100% on 2 L oxygen, chest x-ray negative.  CT of C-spine negative for acute injury but showed degenerative disc disease.  CT of the head showed bilateral small subdural hematoma with left to right midline shift 2 mm.  Patient is admitted to PCU as inpatient.  Dr. Cari Caraway for neurosurgery is consulted. ? ?Due to worsening mentation, CT head was repeated overnight by the nighttime provider which shows significant increase in size of large right hemispheric subdural hemorrhage with moderate mass effect on the right brain parenchyma and approximately 1 cm right to left midline shift. ?Neurosurgery talked with the family about an emergent procedure.  Family decided to transition her to comfort measures only due to continuous suffering and her wishes which were made clear to her family that she does not want any more procedures. ? ?Patient was transitioned to comfort care only. ?Cardiology was contacted today to deactivate defibrillator. ? ?Patient was unresponsive with agonal breathing when seen today.  Multiple family members in the room.  Anticipating hospital death. ? ?Patient  later passed with multiple family members at bedside. ? ?Assessment and Plan: ?* Subdural hematoma ([place]) ?Patient with history of recurrent falls and fractures, this fall resulted in subdural hemorrhage. ?Patient with worsening subdural hemorrhage with midline shift. ?Currently unresponsive with  agonal breathing. ?Family decided to proceed with comfort measures only. ?Multiple baseline comorbidities. ? ?-Comfort measures initiated ?-Cardiology was notified to deactivate defibrillator. ?-Anticipating hospital death ? ? ? ?DISCHARGE MEDICATION: ?Allergies as of 2021-08-14   ? ?   Reactions  ? Celebrex [celecoxib] Anaphylaxis  ? Glipizide Anaphylaxis  ? Levaquin [levofloxacin In D5w] Other (See Comments)  ? Heart arrhthymias  ? Levofloxacin Other (See Comments), Palpitations  ? ICD fired  ? Lisinopril Swelling  ? Lip and facial swelling  ? Sulfa Antibiotics Other (See Comments), Anaphylaxis  ? Reaction: unknown  ? Metformin Other (See Comments)  ? Gi tolerance   ? Penicillins Rash, Other (See Comments)  ? Has patient had a PCN reaction causing immediate rash, facial/tongue/throat swelling, SOB or lightheadedness with hypotension: Unknown ?Has patient had a PCN reaction causing severe rash involving mucus membranes or skin necrosis: No ?Has patient had a PCN reaction that required hospitalization: No ?Has patient had a PCN reaction occurring within the last 10 years: No ?If all of the above answers are "NO", then may proceed with Cephalosporin use.  ? ?  ? ?  ?Medication List  ?  ? ?ASK your doctor about these medications   ? ?albumin human 25 % bottle ?Inject 100 mLs (25 g total) into the vein as needed (Give with paracentesis). ?  ?allopurinol 100 MG tablet ?Commonly known as: ZYLOPRIM ?Take 100 mg by mouth daily. ?  ?aspirin EC 81 MG tablet ?Take 81 mg by mouth at bedtime. ?  ?colchicine 0.6 MG tablet ?Take by mouth. ?  ?docusate sodium 100 MG capsule ?Commonly known as: COLACE ?Take 100 mg by mouth 2 (two) times daily. ?  ?doxycycline 100 MG tablet ?Commonly known as: VIBRA-TABS ?Take 1 tablet (100 mg total) by mouth daily. For SBP ppx. ?  ?Ensure Max Protein Liqd ?Take 330 mLs (11 oz total) by mouth 2 (two) times daily between meals. ?  ?fexofenadine 180 MG tablet ?Commonly known as: ALLEGRA ?Take 180 mg by  mouth daily. ?  ?FLUoxetine 10 MG capsule ?Commonly known as: PROZAC ?Take 10 mg by mouth daily. ?  ?gabapentin 600 MG tablet ?Commonly known as: NEURONTIN ?Take 600 mg by mouth daily. ?  ?HYDROcodone-acetaminophen 5-325 MG tablet ?Commonly known as: NORCO/VICODIN ?Take 1 tablet by mouth 4 (four) times daily as needed. ?  ?hydrOXYzine 25 MG tablet ?Commonly known as: ATARAX ?Take 1 tablet (25 mg total) by mouth every 8 (eight) hours as needed. ?  ?lactulose 10 GM/15ML solution ?Commonly known as: [place] ?Take 20 g by mouth 2 (two) times daily. ?  ?levothyroxine 75 MCG tablet ?Commonly known as: SYNTHROID ?Take 1 tablet (75 mcg total) by mouth daily before breakfast. On Monday, Wednesday and Friday ?  ?levothyroxine 50 MCG tablet ?Commonly known as: SYNTHROID ?Take 50 mcg by mouth daily before breakfast. ?  ?lidocaine 5 % ?Commonly known as: Lidoderm ?Place 1 patch onto the skin every 12 (twelve) hours. Remove & Discard patch within 12 hours or as directed by MD ?  ?LORazepam 0.5 MG tablet ?Commonly known as: ATIVAN ?Take 0.5 mg by mouth 3 (three) times daily as needed. ?  ?mexiletine 200 MG capsule ?Commonly known as: MEXITIL ?Take 200 mg by mouth 2 (two) times daily. ?  ?  midodrine 5 MG tablet ?Commonly known as: PROAMATINE ?Take 5 mg by mouth 3 (three) times daily. ?  ?morphine 15 MG tablet ?Commonly known as: MSIR ?Take 1 tablet (15 mg total) by mouth every 6 (six) hours as needed for severe pain or moderate pain. ?  ?OXYGEN ?Inhale 2 L into the lungs continuous. ?  ?pantoprazole 40 MG tablet ?Commonly known as: PROTONIX ?Take 40 mg by mouth daily. ?  ?simvastatin 20 MG tablet ?Commonly known as: ZOCOR ?Take 20 mg by mouth at bedtime. ?  ?torsemide 20 MG tablet ?Commonly known as: DEMADEX ?Take 40 mg by mouth as needed. ?  ?traZODone 50 MG tablet ?Commonly known as: DESYREL ?Take 50 mg by mouth at bedtime. ?  ? ?  ? ? ?Discharge Exam: ?Filed Weights  ? 08/05/2021 0840 07/18/2021 1735  ?Weight: 85.3 kg 73.9 kg   ? ? ? ?Condition at discharge:  Patient died ? ?The results of significant diagnostics from this hospitalization (including imaging, microbiology, ancillary and laboratory) are listed below for reference.  ? ?[place]

## 2021-09-17 ENCOUNTER — Ambulatory Visit: Payer: Medicare Other | Admitting: Family

## 2021-09-25 ENCOUNTER — Ambulatory Visit: Payer: Medicare Other | Admitting: Oncology

## 2021-09-25 ENCOUNTER — Other Ambulatory Visit: Payer: Medicare Other

## 2021-09-26 ENCOUNTER — Ambulatory Visit: Payer: Medicare Other

## 2022-02-05 IMAGING — CT CT CERVICAL SPINE W/O CM
3 of 4 series · 12 of 34 positions shown, 14 images · non-contrast
Comparison: None.

CLINICAL DATA: Trauma.

EXAM:
CT HEAD WITHOUT CONTRAST
CT CERVICAL SPINE WITHOUT CONTRAST
TECHNIQUE: Multidetector CT imaging of the head and cervical spine was
performed following the standard protocol without intravenous
contrast. Multiplanar CT image reconstructions of the cervical spine
were also generated.

[Series 6: sagittal bone · sagittal · 0.29mm/px · 5 of 56 slices shown, 6 images]
[im 19/56  bone]
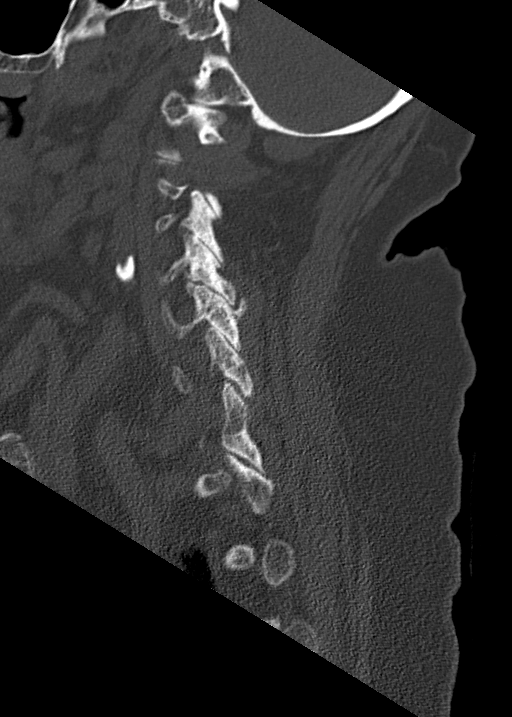
[im 23/56  bone]
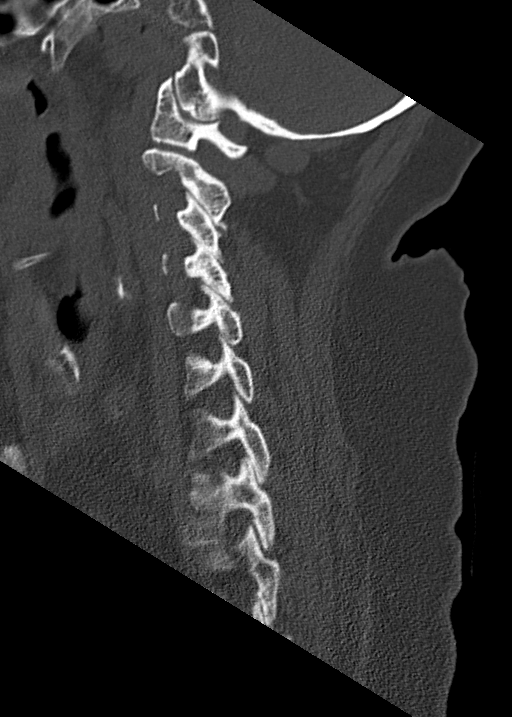
[im 28/56  soft-tissue]
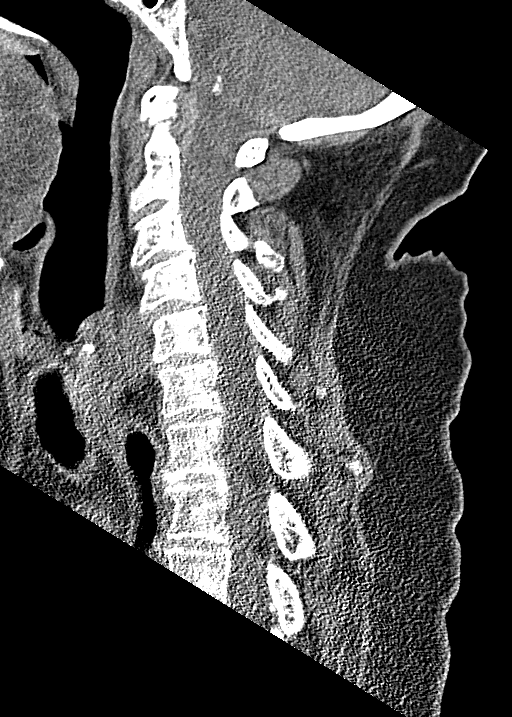
[im 28/56  bone]
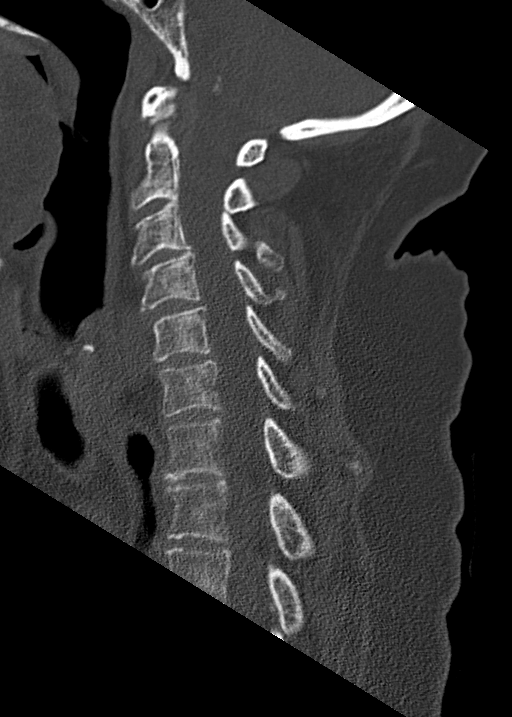
[im 33/56  bone]
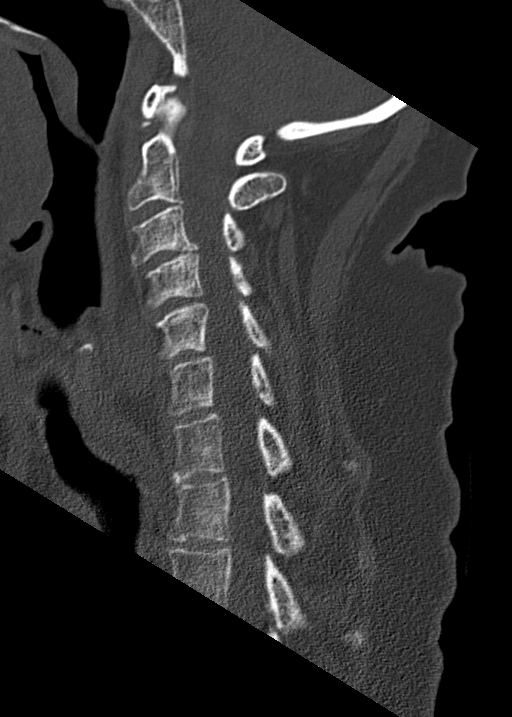
[im 37/56  bone]
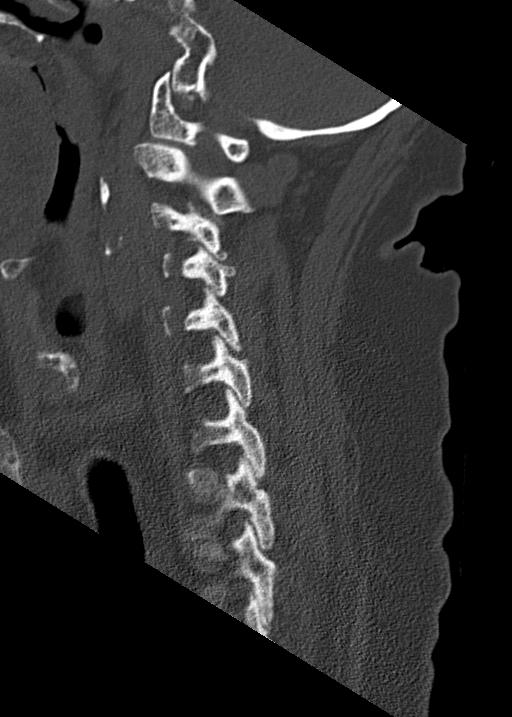

[Series 7: coronal bone · coronal · 0.25mm/px · 3 of 68 slices shown]
[im 19/68  bone]
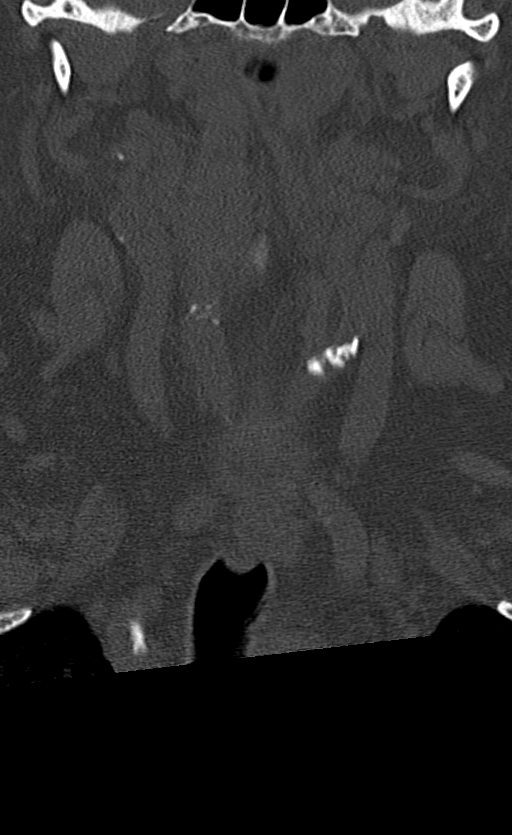
[im 29/68  bone]
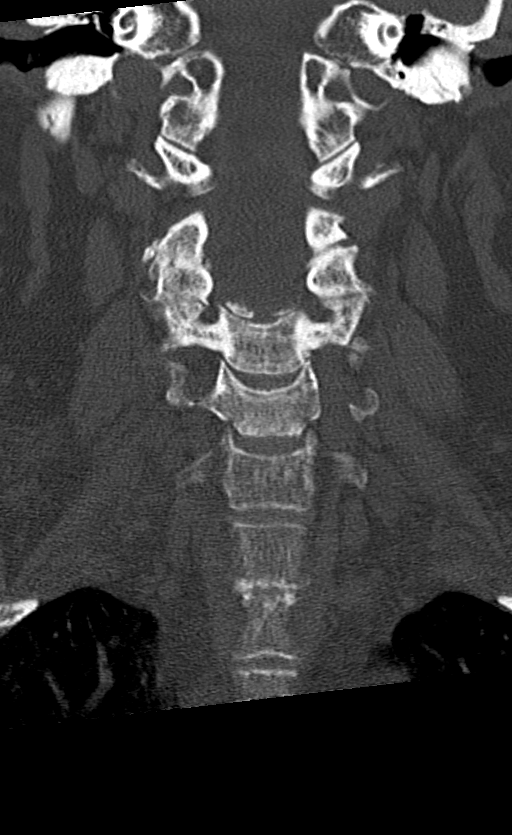
[im 39/68  bone]
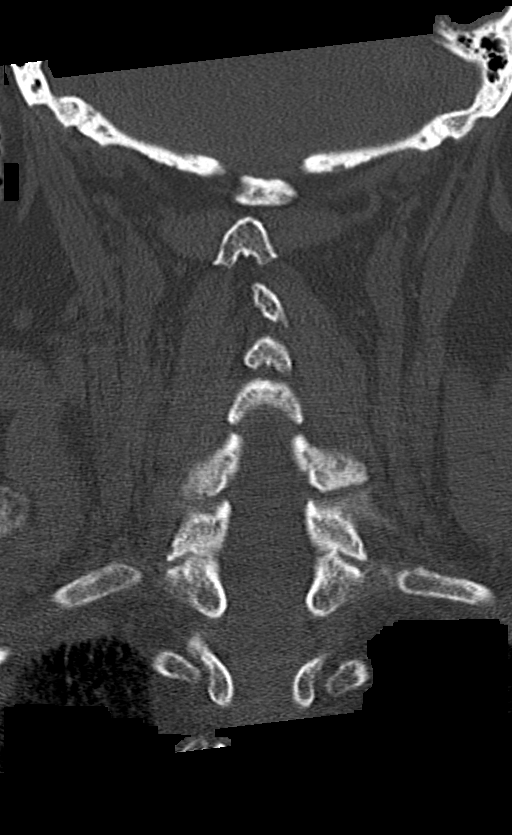

[Series 8: orthogonal bone · axial · 0.30mm/px · z∈[+797,+903]mm · 4 of 94 slices shown, 5 images]
[im 14/94  soft-tissue]
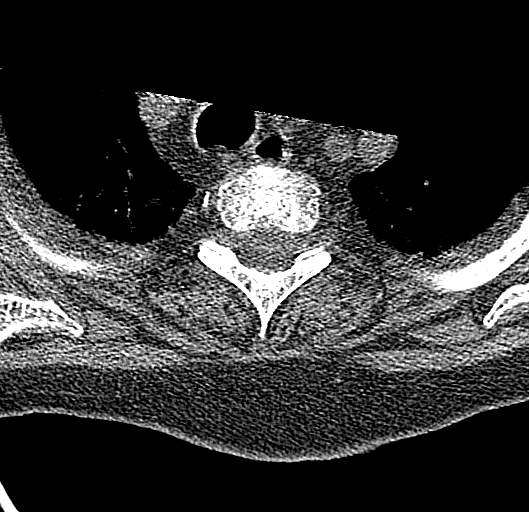
[im 14/94  bone]
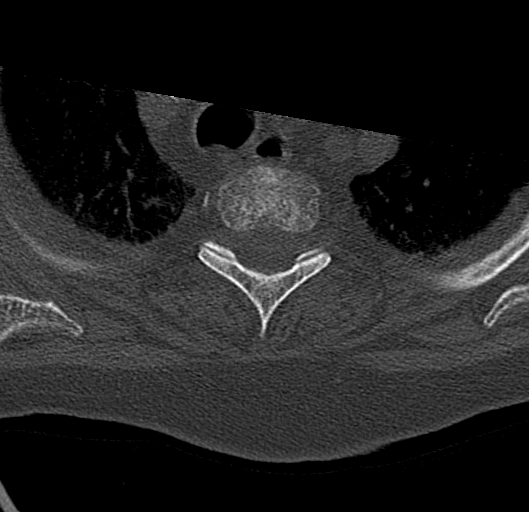
[im 40/94  bone]
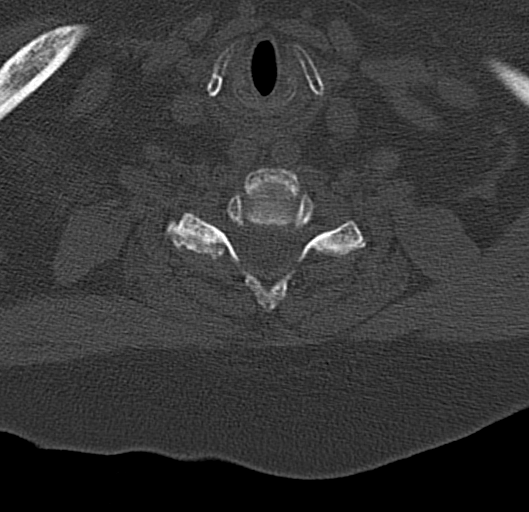
[im 54/94  bone]
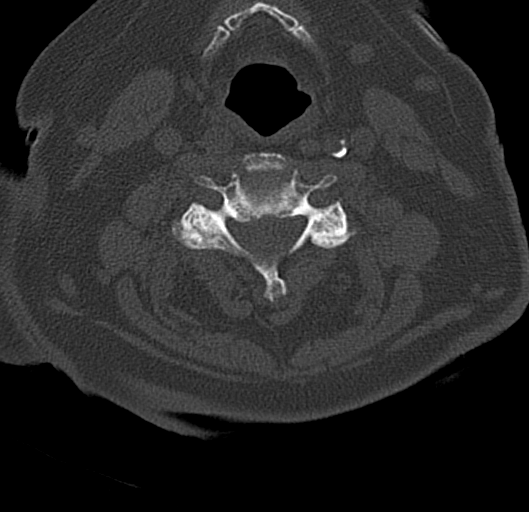
[im 80/94  bone]
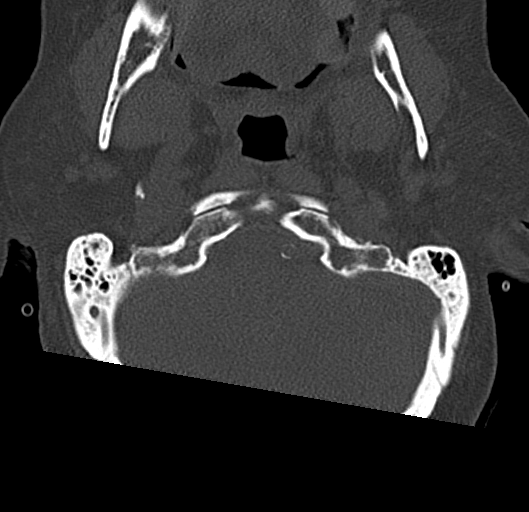

[12 of 34 positions shown; findings below may reference images not displayed]

FINDINGS: CT HEAD FINDINGS

Brain: No evidence of acute large vascular territory infarction,
hemorrhage, hydrocephalus, extra-axial collection or mass
lesion/mass effect. Remote right frontal infarct with
encephalomalacia. Patchy white matter hypoattenuation, compatible
with chronic microvascular ischemic disease. Generalized cerebral
atrophy.

Vascular: Calcific atherosclerosis.

Skull: No acute fracture

Sinuses/Orbits: Visualized sinuses are clear. Unremarkable
visualized orbits.

Other: No mastoid effusions.

CT CERVICAL SPINE FINDINGS

Alignment: Mild reversal of the normal cervical lordosis, likely
positional. Mild anterolisthesis of C3 on C4, likely degenerative
given facet arthropathy at this level. Otherwise, no substantial
subluxation.

Skull base and vertebrae: No acute fracture. Vertebral body heights
are maintained.

Soft tissues and spinal canal: No prevertebral fluid or swelling. No
visible canal hematoma.

Disc levels: Mild multilevel degenerative disc disease. Multilevel
facet arthropathy, greatest at C3-C4.

Upper chest: Emphysema.

Other: Calcific atherosclerosis of the carotids.
IMPRESSION: CT head:

1. No evidence of acute intracranial abnormality.
2. Remote right frontal infarct with encephalomalacia.
3. Chronic microvascular ischemic disease.

CT cervical spine:

No evidence of acute fracture or traumatic malalignment.

## 2022-02-06 IMAGING — DX DG HIP (WITH OR WITHOUT PELVIS) 2-3V*R*
4 series · 4 of 4 positions shown · non-contrast
Comparison: None.

CLINICAL DATA: Postop from right hip arthroplasty.

EXAM:
DG HIP (WITH OR WITHOUT PELVIS) 2-3V RIGHT

[pelvis ap]
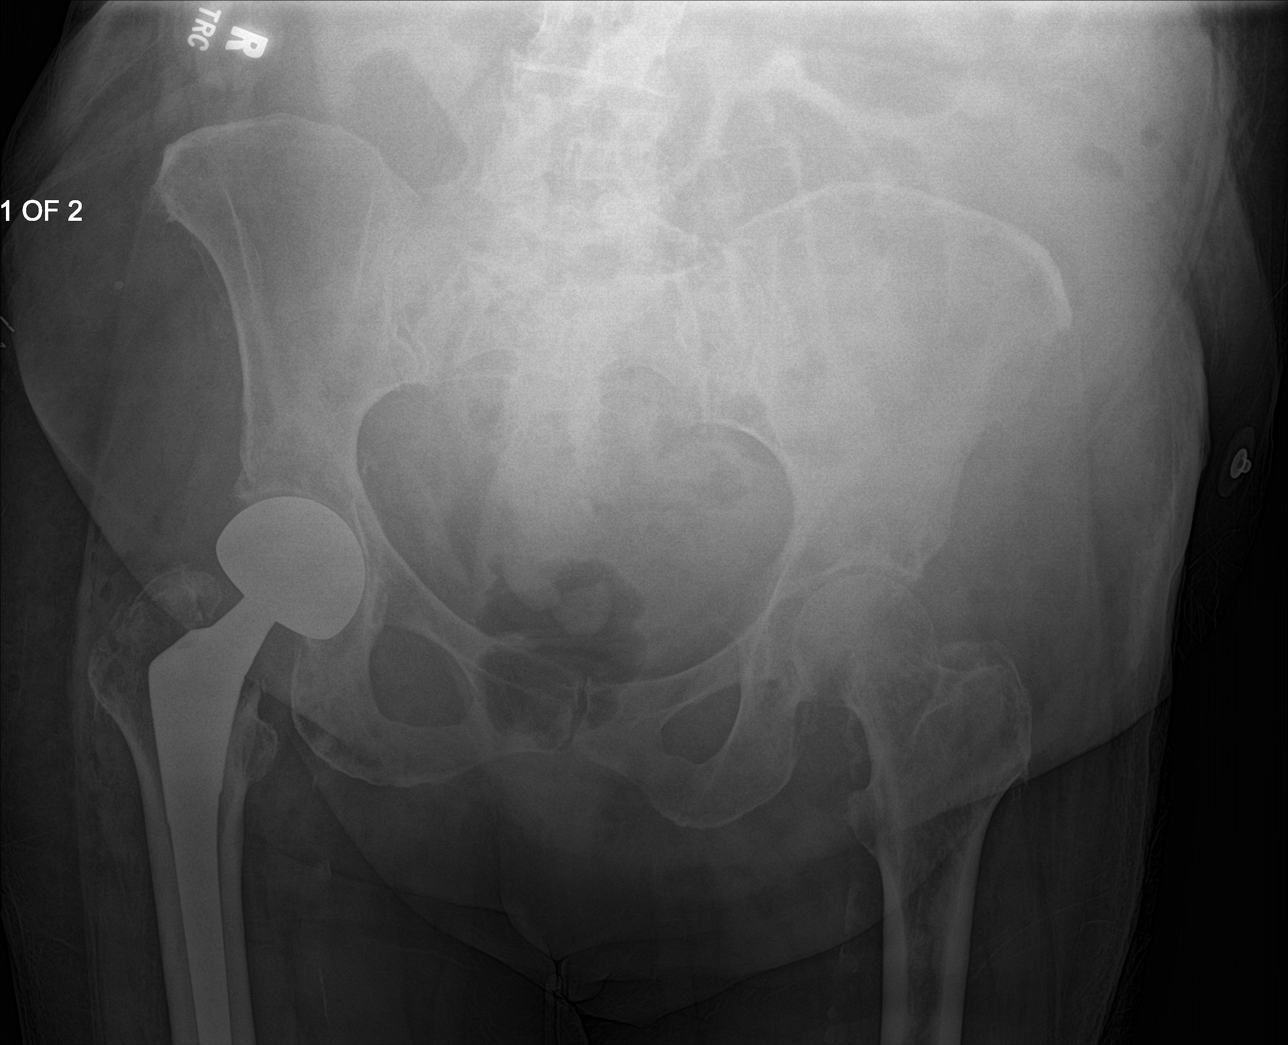

[hip ap]
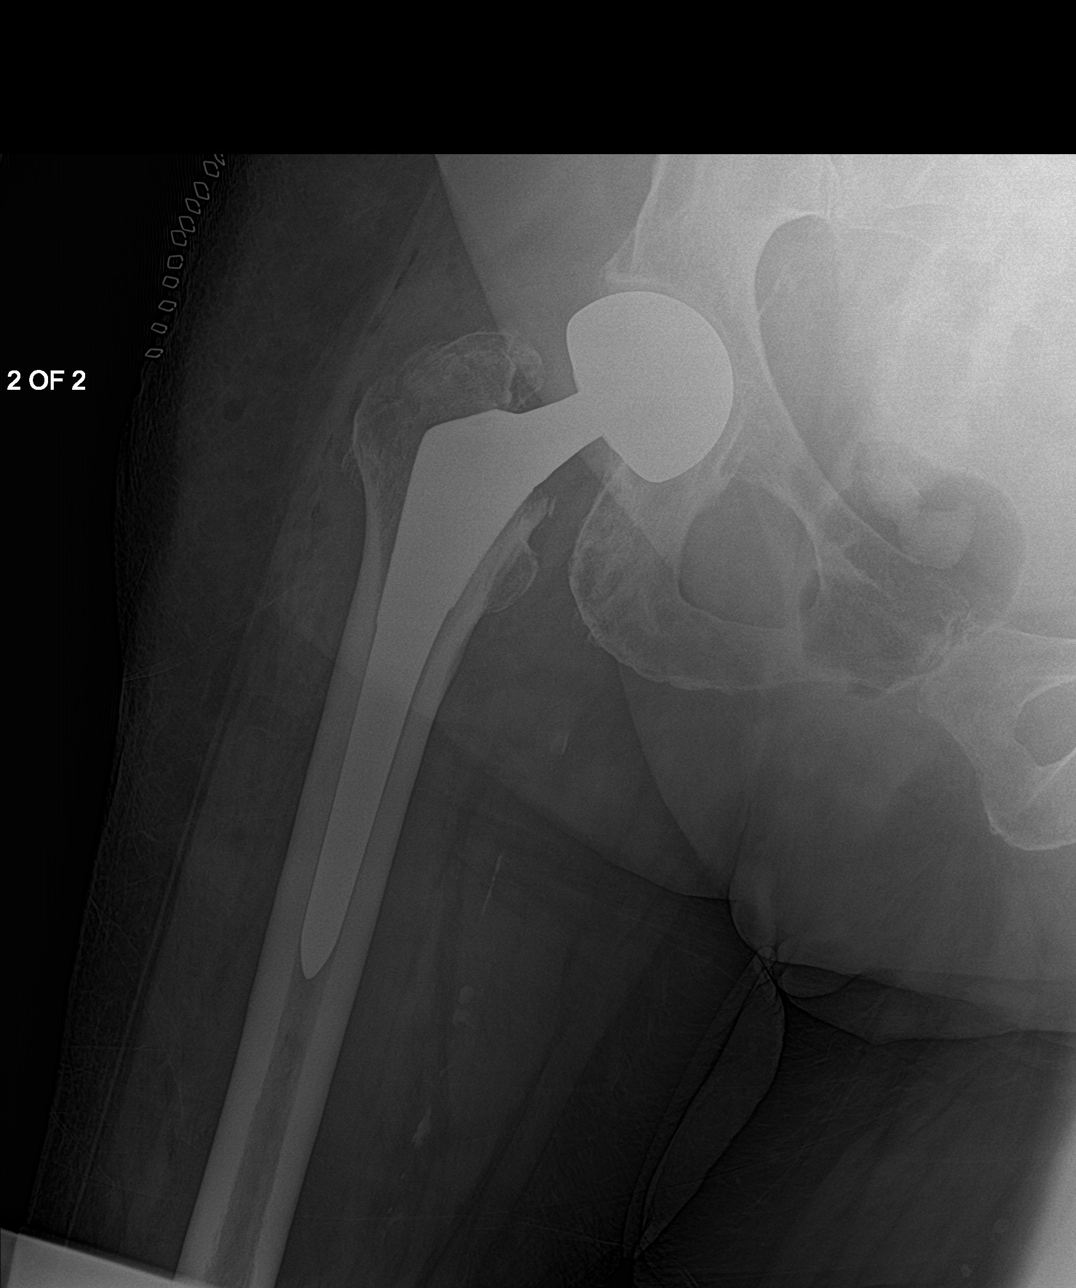

[hip lat (1 of 2)]
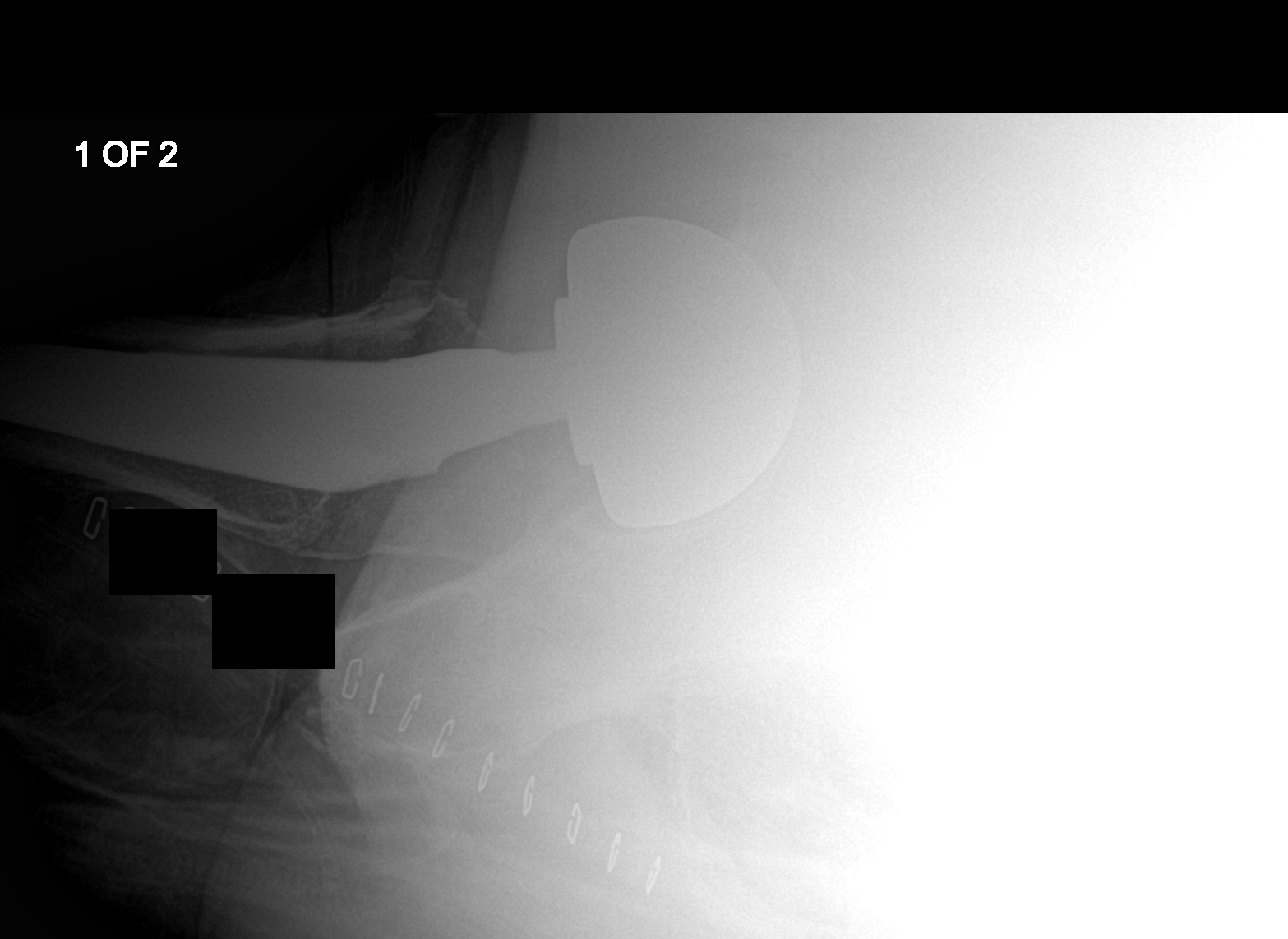

[hip lat (2 of 2)]
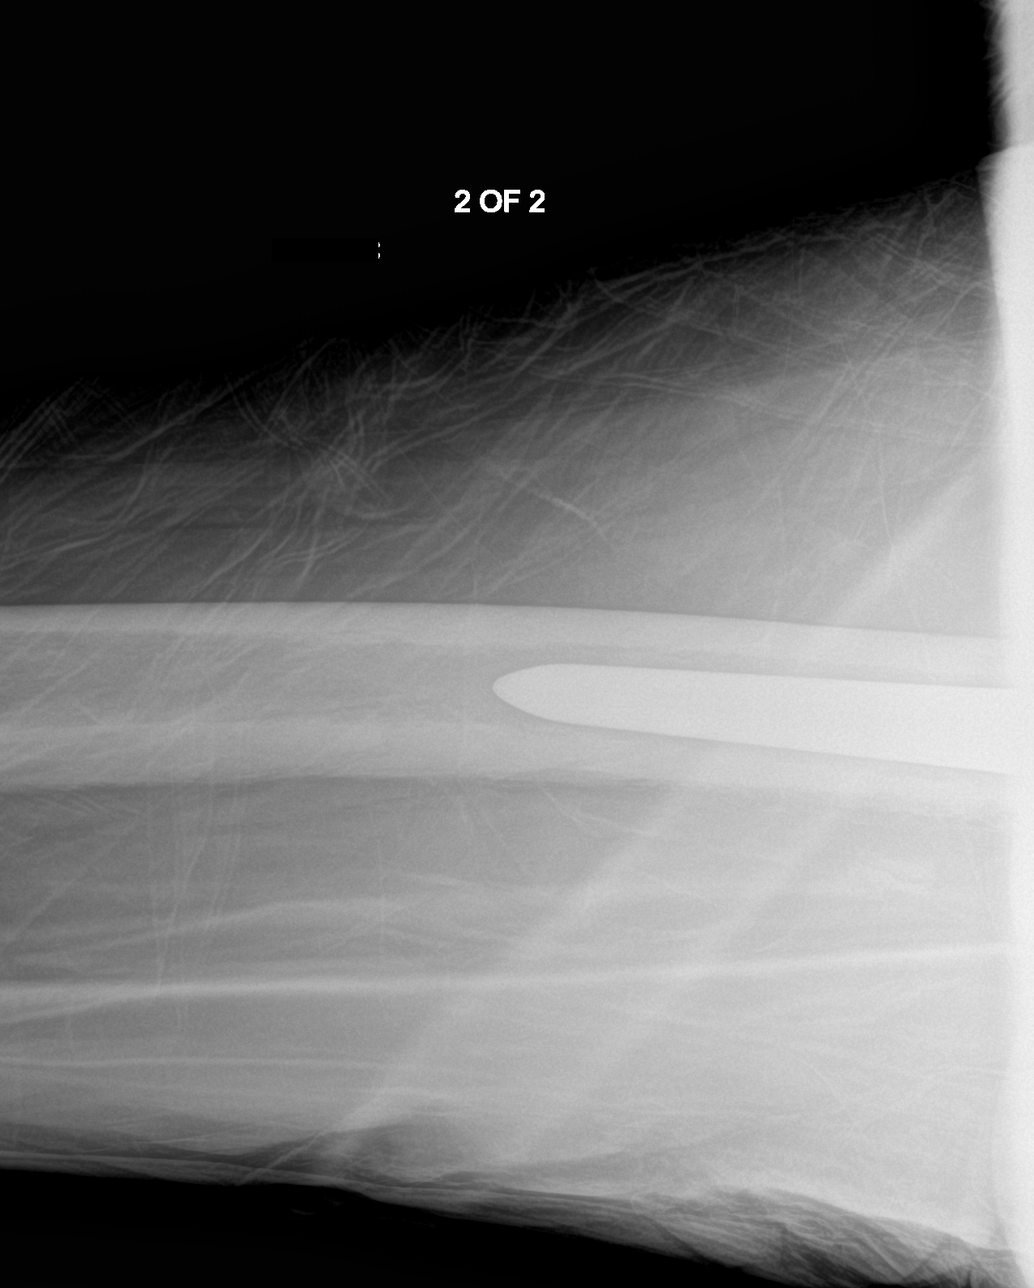

[4 of 4 positions shown; findings below may reference images not displayed]

FINDINGS: A right hip prosthesis is seen in expected position. No evidence of
fracture or dislocation.
IMPRESSION: Expected postoperative appearance of right hip prosthesis.
